# Patient Record
Sex: Male | Born: 1960 | Race: White | Hispanic: No | Smoking: Current every day smoker
Health system: Southern US, Community
[De-identification: ages and names within clinical notes are randomized; demographics above are authoritative.]

## PROBLEM LIST (undated history)

## (undated) ENCOUNTER — Emergency Department: Payer: MEDICAID | Source: Home / Self Care

## (undated) DIAGNOSIS — M1990 Unspecified osteoarthritis, unspecified site: Secondary | ICD-10-CM

## (undated) DIAGNOSIS — F1010 Alcohol abuse, uncomplicated: Secondary | ICD-10-CM

## (undated) DIAGNOSIS — E119 Type 2 diabetes mellitus without complications: Secondary | ICD-10-CM

## (undated) DIAGNOSIS — I10 Essential (primary) hypertension: Secondary | ICD-10-CM

## (undated) DIAGNOSIS — G7000 Myasthenia gravis without (acute) exacerbation: Secondary | ICD-10-CM

## (undated) DIAGNOSIS — I4891 Unspecified atrial fibrillation: Secondary | ICD-10-CM

## (undated) DIAGNOSIS — N289 Disorder of kidney and ureter, unspecified: Secondary | ICD-10-CM

## (undated) DIAGNOSIS — F10239 Alcohol dependence with withdrawal, unspecified: Secondary | ICD-10-CM

## (undated) HISTORY — PX: OTHER SURGICAL HISTORY: SHX169

---

## 1999-03-24 ENCOUNTER — Other Ambulatory Visit: Admission: RE | Admit: 1999-03-24 | Discharge: 1999-03-24 | Payer: Self-pay | Admitting: Urology

## 2001-06-07 ENCOUNTER — Inpatient Hospital Stay (HOSPITAL_COMMUNITY): Admission: EM | Admit: 2001-06-07 | Discharge: 2001-06-09 | Payer: Self-pay

## 2004-04-22 ENCOUNTER — Ambulatory Visit: Payer: Self-pay

## 2006-01-01 ENCOUNTER — Ambulatory Visit: Payer: Self-pay

## 2015-12-26 ENCOUNTER — Inpatient Hospital Stay
Admission: EM | Admit: 2015-12-26 | Discharge: 2015-12-30 | DRG: 617 | Disposition: A | Discharge status: Home / Self Care | Payer: Self-pay | Attending: Internal Medicine | Admitting: Internal Medicine

## 2015-12-26 ENCOUNTER — Encounter: Payer: Self-pay | Admitting: Emergency Medicine

## 2015-12-26 ENCOUNTER — Emergency Department: Payer: Self-pay

## 2015-12-26 DIAGNOSIS — I739 Peripheral vascular disease, unspecified: Secondary | ICD-10-CM

## 2015-12-26 DIAGNOSIS — M869 Osteomyelitis, unspecified: Secondary | ICD-10-CM

## 2015-12-26 DIAGNOSIS — Z79899 Other long term (current) drug therapy: Secondary | ICD-10-CM

## 2015-12-26 DIAGNOSIS — G7000 Myasthenia gravis without (acute) exacerbation: Secondary | ICD-10-CM

## 2015-12-26 DIAGNOSIS — Z79891 Long term (current) use of opiate analgesic: Secondary | ICD-10-CM

## 2015-12-26 DIAGNOSIS — E1169 Type 2 diabetes mellitus with other specified complication: Principal | ICD-10-CM | POA: Diagnosis present

## 2015-12-26 DIAGNOSIS — E11621 Type 2 diabetes mellitus with foot ulcer: Secondary | ICD-10-CM | POA: Diagnosis present

## 2015-12-26 DIAGNOSIS — L03032 Cellulitis of left toe: Secondary | ICD-10-CM | POA: Diagnosis present

## 2015-12-26 DIAGNOSIS — L8990 Pressure ulcer of unspecified site, unspecified stage: Secondary | ICD-10-CM | POA: Insufficient documentation

## 2015-12-26 DIAGNOSIS — Z96641 Presence of right artificial hip joint: Secondary | ICD-10-CM | POA: Diagnosis present

## 2015-12-26 DIAGNOSIS — M86172 Other acute osteomyelitis, left ankle and foot: Secondary | ICD-10-CM | POA: Diagnosis present

## 2015-12-26 DIAGNOSIS — E876 Hypokalemia: Secondary | ICD-10-CM | POA: Diagnosis present

## 2015-12-26 DIAGNOSIS — E1142 Type 2 diabetes mellitus with diabetic polyneuropathy: Secondary | ICD-10-CM | POA: Diagnosis present

## 2015-12-26 DIAGNOSIS — E1151 Type 2 diabetes mellitus with diabetic peripheral angiopathy without gangrene: Secondary | ICD-10-CM | POA: Diagnosis present

## 2015-12-26 DIAGNOSIS — E118 Type 2 diabetes mellitus with unspecified complications: Secondary | ICD-10-CM

## 2015-12-26 DIAGNOSIS — G8929 Other chronic pain: Secondary | ICD-10-CM | POA: Diagnosis present

## 2015-12-26 DIAGNOSIS — Z7984 Long term (current) use of oral hypoglycemic drugs: Secondary | ICD-10-CM

## 2015-12-26 DIAGNOSIS — I10 Essential (primary) hypertension: Secondary | ICD-10-CM | POA: Diagnosis present

## 2015-12-26 DIAGNOSIS — L97524 Non-pressure chronic ulcer of other part of left foot with necrosis of bone: Secondary | ICD-10-CM | POA: Diagnosis present

## 2015-12-26 HISTORY — DX: Myasthenia gravis without (acute) exacerbation: G70.00

## 2015-12-26 HISTORY — DX: Essential (primary) hypertension: I10

## 2015-12-26 HISTORY — DX: Disorder of kidney and ureter, unspecified: N28.9

## 2015-12-26 HISTORY — DX: Type 2 diabetes mellitus without complications: E11.9

## 2015-12-26 HISTORY — DX: Unspecified osteoarthritis, unspecified site: M19.90

## 2015-12-26 LAB — CBC WITH DIFFERENTIAL/PLATELET
BASOS ABS: 0.1 10*3/uL (ref 0–0.1)
BASOS PCT: 1 %
EOS PCT: 1 %
Eosinophils Absolute: 0.1 10*3/uL (ref 0–0.7)
HCT: 41.4 % (ref 40.0–52.0)
Hemoglobin: 14.4 g/dL (ref 13.0–18.0)
Lymphocytes Relative: 17 %
Lymphs Abs: 1.9 10*3/uL (ref 1.0–3.6)
MCH: 33.3 pg (ref 26.0–34.0)
MCHC: 34.7 g/dL (ref 32.0–36.0)
MCV: 96.1 fL (ref 80.0–100.0)
MONO ABS: 1.3 10*3/uL — AB (ref 0.2–1.0)
MONOS PCT: 12 %
Neutro Abs: 7.6 10*3/uL — ABNORMAL HIGH (ref 1.4–6.5)
Neutrophils Relative %: 69 %
PLATELETS: 250 10*3/uL (ref 150–440)
RBC: 4.31 MIL/uL — ABNORMAL LOW (ref 4.40–5.90)
RDW: 14.7 % — AB (ref 11.5–14.5)
WBC: 11 10*3/uL — ABNORMAL HIGH (ref 3.8–10.6)

## 2015-12-26 LAB — COMPREHENSIVE METABOLIC PANEL
ALBUMIN: 4 g/dL (ref 3.5–5.0)
ALT: 17 U/L (ref 17–63)
ANION GAP: 12 (ref 5–15)
AST: 29 U/L (ref 15–41)
Alkaline Phosphatase: 79 U/L (ref 38–126)
BUN: 10 mg/dL (ref 6–20)
CALCIUM: 8.9 mg/dL (ref 8.9–10.3)
CO2: 27 mmol/L (ref 22–32)
Chloride: 95 mmol/L — ABNORMAL LOW (ref 101–111)
Creatinine, Ser: 0.92 mg/dL (ref 0.61–1.24)
GFR calc Af Amer: >60 mL/min (ref >60)
GLUCOSE: 167 mg/dL — AB (ref 65–99)
POTASSIUM: 3.3 mmol/L — AB (ref 3.5–5.1)
Sodium: 134 mmol/L — ABNORMAL LOW (ref 135–145)
TOTAL PROTEIN: 7.7 g/dL (ref 6.5–8.1)
Total Bilirubin: 0.7 mg/dL (ref 0.3–1.2)

## 2015-12-26 LAB — GLUCOSE, CAPILLARY
Glucose-Capillary: 148 mg/dL — ABNORMAL HIGH (ref 65–99)
Glucose-Capillary: 150 mg/dL — ABNORMAL HIGH (ref 65–99)

## 2015-12-26 MED ORDER — INSULIN ASPART 100 UNIT/ML ~~LOC~~ SOLN
0.0000 [IU] | Freq: Three times a day (TID) | SUBCUTANEOUS | Status: DC
  Administered 2015-12-27: 1 [IU] via SUBCUTANEOUS
  Filled 2015-12-26: qty 1

## 2015-12-26 MED ORDER — PIPERACILLIN-TAZOBACTAM 3.375 G IVPB
3.3750 g | Freq: Three times a day (TID) | INTRAVENOUS | Status: DC
Start: 2015-12-26 — End: 2015-12-27
  Administered 2015-12-26 – 2015-12-27 (×2): 3.375 g via INTRAVENOUS
  Filled 2015-12-26 (×7): qty 50

## 2015-12-26 MED ORDER — VANCOMYCIN HCL IN DEXTROSE 1-5 GM/200ML-% IV SOLN
1000.0000 mg | Freq: Once | INTRAVENOUS | Status: AC
  Administered 2015-12-26: 1000 mg via INTRAVENOUS
  Filled 2015-12-26: qty 200

## 2015-12-26 MED ORDER — ASPIRIN EC 81 MG PO TBEC
81.0000 mg | DELAYED_RELEASE_TABLET | Freq: Every day | ORAL | Status: DC
  Administered 2015-12-27 – 2015-12-30 (×3): 81 mg via ORAL
  Filled 2015-12-26 (×3): qty 1

## 2015-12-26 MED ORDER — BISACODYL 10 MG RE SUPP: 10.0000 mg | Freq: Every day | RECTAL | Status: DC | PRN

## 2015-12-26 MED ORDER — METOPROLOL TARTRATE 25 MG PO TABS
25.0000 mg | ORAL_TABLET | Freq: Two times a day (BID) | ORAL | Status: DC
  Administered 2015-12-26 – 2015-12-29 (×6): 25 mg via ORAL
  Filled 2015-12-26 (×8): qty 1

## 2015-12-26 MED ORDER — LOSARTAN POTASSIUM 50 MG PO TABS
50.0000 mg | ORAL_TABLET | Freq: Every day | ORAL | Status: DC
  Administered 2015-12-27 – 2015-12-28 (×2): 50 mg via ORAL
  Filled 2015-12-26 (×2): qty 1

## 2015-12-26 MED ORDER — FENTANYL 50 MCG/HR TD PT72
50.0000 ug | MEDICATED_PATCH | TRANSDERMAL | Status: DC
  Administered 2015-12-27: 50 ug via TRANSDERMAL
  Filled 2015-12-26: qty 1

## 2015-12-26 MED ORDER — HYDROCODONE-ACETAMINOPHEN 5-325 MG PO TABS
1.0000 | ORAL_TABLET | ORAL | Status: DC | PRN
  Administered 2015-12-26 – 2015-12-27 (×2): 2 via ORAL
  Administered 2015-12-27: 1 via ORAL
  Administered 2015-12-27 – 2015-12-30 (×8): 2 via ORAL
  Filled 2015-12-26 (×3): qty 2
  Filled 2015-12-26: qty 1
  Filled 2015-12-26 (×8): qty 2

## 2015-12-26 MED ORDER — DOCUSATE SODIUM 100 MG PO CAPS
100.0000 mg | ORAL_CAPSULE | Freq: Two times a day (BID) | ORAL | Status: DC
Start: 2015-12-26 — End: 2015-12-30
  Administered 2015-12-26 – 2015-12-30 (×7): 100 mg via ORAL
  Filled 2015-12-26 (×7): qty 1

## 2015-12-26 MED ORDER — ACETAMINOPHEN 650 MG RE SUPP: 650.0000 mg | Freq: Four times a day (QID) | RECTAL | Status: DC | PRN

## 2015-12-26 MED ORDER — GABAPENTIN 600 MG PO TABS
600.0000 mg | ORAL_TABLET | Freq: Three times a day (TID) | ORAL | Status: DC
  Administered 2015-12-26 – 2015-12-30 (×10): 600 mg via ORAL
  Filled 2015-12-26 (×11): qty 1

## 2015-12-26 MED ORDER — MORPHINE SULFATE (PF) 2 MG/ML IV SOLN
INTRAVENOUS | Status: AC
  Administered 2015-12-26: 2 mg via INTRAVENOUS
  Filled 2015-12-26: qty 1

## 2015-12-26 MED ORDER — PIPERACILLIN-TAZOBACTAM 3.375 G IVPB 30 MIN
3.3750 g | Freq: Once | INTRAVENOUS | Status: AC
  Administered 2015-12-26: 3.375 g via INTRAVENOUS
  Filled 2015-12-26: qty 50

## 2015-12-26 MED ORDER — HEPARIN SODIUM (PORCINE) 5000 UNIT/ML IJ SOLN
5000.0000 [IU] | Freq: Three times a day (TID) | INTRAMUSCULAR | Status: DC
Start: 2015-12-26 — End: 2015-12-30
  Administered 2015-12-26 – 2015-12-30 (×9): 5000 [IU] via SUBCUTANEOUS
  Filled 2015-12-26 (×9): qty 1

## 2015-12-26 MED ORDER — ACETAMINOPHEN 325 MG PO TABS: 650.0000 mg | ORAL_TABLET | Freq: Four times a day (QID) | ORAL | Status: DC | PRN

## 2015-12-26 MED ORDER — SODIUM CHLORIDE 0.9 % IV SOLN
1500.0000 mg | Freq: Two times a day (BID) | INTRAVENOUS | Status: DC
  Administered 2015-12-27: 1500 mg via INTRAVENOUS
  Filled 2015-12-26 (×4): qty 1500

## 2015-12-26 MED ORDER — ONDANSETRON HCL 4 MG PO TABS: 4.0000 mg | ORAL_TABLET | Freq: Four times a day (QID) | ORAL | Status: DC | PRN

## 2015-12-26 MED ORDER — CLONAZEPAM 0.5 MG PO TABS: 0.5000 mg | ORAL_TABLET | Freq: Three times a day (TID) | ORAL | Status: DC | PRN

## 2015-12-26 MED ORDER — ONDANSETRON HCL 4 MG/2ML IJ SOLN: 4.0000 mg | Freq: Four times a day (QID) | INTRAMUSCULAR | Status: DC | PRN

## 2015-12-26 MED ORDER — CITALOPRAM HYDROBROMIDE 20 MG PO TABS
20.0000 mg | ORAL_TABLET | Freq: Every day | ORAL | Status: DC
  Administered 2015-12-27 – 2015-12-30 (×4): 20 mg via ORAL
  Filled 2015-12-26 (×4): qty 1

## 2015-12-26 MED ORDER — MYCOPHENOLATE MOFETIL 250 MG PO CAPS
250.0000 mg | ORAL_CAPSULE | Freq: Two times a day (BID) | ORAL | Status: DC
  Administered 2015-12-26 – 2015-12-28 (×4): 250 mg via ORAL
  Filled 2015-12-26 (×5): qty 1

## 2015-12-26 MED ORDER — POTASSIUM CHLORIDE IN NACL 20-0.9 MEQ/L-% IV SOLN
INTRAVENOUS | Status: AC
  Filled 2015-12-26: qty 1000

## 2015-12-26 MED ORDER — POTASSIUM CHLORIDE IN NACL 20-0.9 MEQ/L-% IV SOLN
INTRAVENOUS | Status: DC
  Administered 2015-12-26 – 2015-12-28 (×3): via INTRAVENOUS
  Filled 2015-12-26 (×7): qty 1000

## 2015-12-26 MED ORDER — MORPHINE SULFATE (PF) 2 MG/ML IV SOLN
2.0000 mg | INTRAVENOUS | Status: DC | PRN
  Administered 2015-12-26 – 2015-12-28 (×11): 2 mg via INTRAVENOUS
  Filled 2015-12-26 (×10): qty 1

## 2015-12-26 MED ORDER — PANTOPRAZOLE SODIUM 40 MG PO TBEC
40.0000 mg | DELAYED_RELEASE_TABLET | Freq: Every day | ORAL | Status: DC
  Administered 2015-12-27 – 2015-12-30 (×3): 40 mg via ORAL
  Filled 2015-12-26 (×3): qty 1

## 2015-12-26 NOTE — Progress Notes (Signed)
Pharmacy Antibiotic Note  Kurt Horton is a 55 y.o. male admitted on 12/26/2015 with osteomyelitis.  Pharmacy has been consulted for Vancomycin and Zosyn dosing.  Plan: Vancomycin 1500 IV every 12 hours.  Goal trough 15-20 mcg/mL. Zosyn 3.375g IV q8h (4 hour infusion).  Height: 5\' 10"  (177.8 cm) Weight: 198 lb (89.8 kg) IBW/kg (Calculated) : 73  Temp (24hrs), Avg:98 F (36.7 C), Min:98 F (36.7 C), Max:98 F (36.7 C)   Recent Labs Lab 12/26/15 1641  WBC 11.0*  CREATININE 0.92    Estimated Creatinine Clearance: 103.5 mL/min (by C-G formula based on SCr of 0.92 mg/dL).    Allergies  Allergen Reactions  . Other Other (See Comments)    Patient states he can't take any "mycin" medications because myasthenia gravis.    Antimicrobials this admission: Vancomycin 8/20 >>  Zosyn 8/20 >>   Dose adjustments this admission:  Microbiology results:  Thank you for allowing pharmacy to be a part of this patient's care.  Clovia CuffLisa Shalece Staffa, PharmD, BCPS 12/26/2015 7:21 PM

## 2015-12-26 NOTE — H&P (Signed)
History and Physical    Kurt Horton WUJ:[id number] DOB: 13-Mar-1961 DOA: 12/26/2015  Referring physician: Dr. Cyril LoosenKinner PCP: No primary care provider on file.  Specialists: none  Chief Complaint: foot pain  HPI: Kurt Horton is a 55 y.o. male has a past medical history significant for DM, PVD, and Myasthenia Gravis now with 2 week hx of progressive left foot pain and swelling with ulceration and redness. No fever. No N/V/D. Denies Cp or SOB. Plain films in ER suggest osteomyelitis. He is now admitted.  Review of Systems: The patient denies anorexia, fever, weight loss,, vision loss, decreased hearing, hoarseness, chest pain, syncope, dyspnea on exertion, peripheral edema, balance deficits, hemoptysis, abdominal pain, melena, hematochezia, severe indigestion/heartburn, hematuria, incontinence, genital sores, muscle weakness, suspicious skin lesions, transient blindness, difficulty walking, depression, unusual weight change, abnormal bleeding, enlarged lymph nodes, angioedema, and breast masses.   Past Medical History:  Diagnosis Date  . Diabetes mellitus without complication (HCC)   . DJD (degenerative joint disease)   . Hypertension   . Myasthenia gravis (HCC)   . Renal disorder    History reviewed. No pertinent surgical history. Social History:  reports that he has never smoked. He has never used smokeless tobacco. His alcohol and drug histories are not on file.  No Known Allergies  History reviewed. No pertinent family history.  Prior to Admission medications   Not on File   Physical Exam: Vitals:   12/26/15 1542 12/26/15 1601  BP:  133/89  Pulse:  94  Resp:  16  Temp:  98 F (36.7 C)  TempSrc:  Oral  SpO2:  96%  Weight: 89.8 kg (198 lb)   Height: 5\' 10"  (1.778 m)      General:  No apparent distress, WDWN, [place]/AT  Eyes: PERRL, EOMI, no scleral icterus, conjunctiva clear  ENT: moist oropharynx without exudate, TM's benign, dentition fair  Neck: supple, no  lymphadenopathy. No bruits or thyromegaly  Cardiovascular: regular rate without MRG; 2+ peripheral pulses, no JVD, 1+ peripheral edema of left foot  Respiratory: CTA biL, good air movement without wheezing, rhonchi or crackled. Respiratory effort normal  Abdomen: soft, non tender to palpation, positive bowel sounds, no guarding, no rebound  Skin: small ulceration to left foot/2nd toe with surrounding warmth, erythema, and tenderness. No purulence  Musculoskeletal: normal bulk and tone, no joint swelling  Psychiatric: normal mood and affect, A&OX3  Neurologic: CN 2-12 grossly intact, Motor strength 5/5 in all 4 groups with symmetric DTR's and stocking-glove neuropathy noted  Labs on Admission:  Basic Metabolic Panel:  Recent Labs Lab 12/26/15 1641  NA 134*  K 3.3*  CL 95*  CO2 27  GLUCOSE 167*  BUN 10  CREATININE 0.92  CALCIUM 8.9   Liver Function Tests:  Recent Labs Lab 12/26/15 1641  AST 29  ALT 17  ALKPHOS 79  BILITOT 0.7  PROT 7.7  ALBUMIN 4.0   No results for input(s): LIPASE, AMYLASE in the last 168 hours. No results for input(s): AMMONIA in the last 168 hours. CBC:  Recent Labs Lab 12/26/15 1641  WBC 11.0*  NEUTROABS 7.6*  HGB 14.4  HCT 41.4  MCV 96.1  PLT 250   Cardiac Enzymes: No results for input(s): CKTOTAL, CKMB, CKMBINDEX, TROPONINI in the last 168 hours.  BNP (last 3 results) No results for input(s): BNP in the last 8760 hours.  ProBNP (last 3 results) No results for input(s): PROBNP in the last 8760 hours.  CBG:  Recent Labs Lab 12/26/15  1621  GLUCAP 148*    Radiological Exams on Admission: Dg Foot Complete Left  Result Date: 12/26/2015 CLINICAL DATA:  Left foot swelling, second toe ulcer EXAM: LEFT FOOT - COMPLETE 3+ VIEW COMPARISON:  None. FINDINGS: Three views of the left foot submitted. No acute fracture or subluxation. There is soft tissue swelling dorsal tarsal and metatarsal region. Degenerative changes are noted first  metatarsal phalangeal joint. Diffuse soft tissue swelling noted second toe. Subtle erosive changes are noted at the tip of distal phalanx second toe. Findings are highly suspicious for osteomyelitis. Clinical correlation is necessary. Further correlation with MRI could be performed as clinically warranted. IMPRESSION: No acute fracture or subluxation. There is soft tissue swelling dorsal tarsal and metatarsal region. Degenerative changes are noted first metatarsal phalangeal joint. Diffuse soft tissue swelling noted second toe. Subtle erosive changes are noted at the tip of distal phalanx second toe. Findings are highly suspicious for osteomyelitis. Clinical correlation is necessary. Further correlation with MRI could be performed as clinically warranted. Electronically Signed   By: Natasha MeadLiviu  Pop M.D.   On: 12/26/2015 17:23    EKG: Independently reviewed.  Assessment/Plan Principal Problem:   Osteomyelitis (HCC) Active Problems:   PVD (peripheral vascular disease) (HCC)   Type II diabetes mellitus with manifestations (HCC)   Myasthenia gravis (HCC)   Will admit to floor with IV fluids and IV ABX. Cultures sent. Consult Podiatry and Vascular. Follow sugars. Repeat labs in AM.  Diet: carb modified Fluids: NS with K+@100  DVT Prophylaxis: SQ Heparin  Code Status: FULL  Family Communication: none  Disposition Plan: home  Time spent: 50 min

## 2015-12-26 NOTE — ED Triage Notes (Signed)
C/O left foot swelling and left second toe ulcer x 2-3 weeks.  Patient states sugars have been normal.

## 2015-12-26 NOTE — ED Provider Notes (Signed)
[facility] Emergency Department Provider Note   ____________________________________________    I have reviewed the triage vital signs and the nursing notes.   HISTORY  Chief Complaint Foot Swelling     HPI Kurt PhenesJohn D Tirpak is a 55 y.o. male who presents with complaints of foot swelling and pain which has occurred over the last 2 weeks. 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 He also has a history of myasthenia gravis. He reports small ulceration in his left second toe with swelling of his toe and now redness pain and swelling of his foot. He denies fevers or chills. He has recently moved back to the area does not have a podiatrist here. He has not taken anything for this   Past Medical History:  Diagnosis Date  . Diabetes mellitus without complication (HCC)   . DJD (degenerative joint disease)   . Hypertension   . Myasthenia gravis (HCC)   . Renal disorder     There are no active problems to display for this patient.   No past surgical history on file.  Prior to Admission medications   Not on File     Allergies Review of patient's allergies indicates no known allergies.  No family history on file.  Social History Social History  Substance Use Topics  . Smoking status: Never Smoker  . Smokeless tobacco: Never Used  . Alcohol use Not on file    Review of Systems  Constitutional: No fever/chills   Respiratory: Denies shortness of breath. Gastrointestinal: No abdominal pain.    Musculoskeletal: As above Skin: As above Neurological: Negative for headaches   10-point ROS otherwise negative.  ____________________________________________   PHYSICAL EXAM:  VITAL SIGNS: ED Triage Vitals  Enc Vitals Group     BP 12/26/15 1601 133/89     Pulse Rate 12/26/15 1601 94     Resp 12/26/15 1601 16     Temp 12/26/15 1601 98 F (36.7 C)     Temp Source 12/26/15 1601 Oral     SpO2 12/26/15  1601 96 %     Weight 12/26/15 1542 198 lb (89.8 kg)     Height 12/26/15 1542 5\' 10"  (1.778 m)     Head Circumference --      Peak Flow --      Pain Score 12/26/15 1544 8     Pain Loc --      Pain Edu? --      Excl. in GC? --     Constitutional: Alert and oriented. No acute distress.  Eyes: Conjunctivae are normal.  Head: Atraumatic. Nose: No congestion/rhinnorhea. Mouth/Throat: Mucous membranes are moist.   Neck:  Painless ROM Cardiovascular: Normal rate, regular rhythm. Grossly normal heart sounds.  Good peripheral circulation. Respiratory: Normal respiratory effort.  No retractions. Lungs CTAB. Gastrointestinal: Soft and nontender. No distention.  No CVA tenderness. Genitourinary: deferred Musculoskeletal: Patient with 1 x 1 cm ulceration in the left second distal toe, uniform erythema and swelling of the toe. Mild erythema extending onto the foot as well. 2+ distal pulses. Warm and well perfused Neurologic:  Normal speech and language. No gross focal neurologic deficits are appreciated.  Skin:  Skin is warm, dry. As above Psychiatric: Mood and affect are normal. Speech and behavior are normal.  ____________________________________________   LABS (all labs ordered are listed, but only abnormal results are displayed)  Labs Reviewed  GLUCOSE, CAPILLARY - Abnormal; Notable for the following:  Result Value   Glucose-Capillary 148 (*)    All other components within normal limits  CBC WITH DIFFERENTIAL/PLATELET - Abnormal; Notable for the following:    WBC 11.0 (*)    RBC 4.31 (*)    RDW 14.7 (*)    Neutro Abs 7.6 (*)    Monocytes Absolute 1.3 (*)    All other components within normal limits  COMPREHENSIVE METABOLIC PANEL - Abnormal; Notable for the following:    Sodium 134 (*)    Potassium 3.3 (*)    Chloride 95 (*)    Glucose, Bld 167 (*)    All other components within normal limits  CULTURE, BLOOD (ROUTINE X 2)  CULTURE, BLOOD (ROUTINE X 2)    ____________________________________________  EKG  None ____________________________________________  RADIOLOGY  Concerning for osteomyelitis ____________________________________________   PROCEDURES  Procedure(s) performed: No    Critical Care performed: No ____________________________________________   INITIAL IMPRESSION / ASSESSMENT AND PLAN / ED COURSE  Pertinent labs & imaging results that were available during my care of the patient were reviewed by me and considered in my medical decision making (see chart for details).  Patient's clinical exam is concerning for osteomyelitis, x-ray pending, labs pending.  Clinical Course  Labs are reassuring however x-ray is consistent with osteomyelitis. I ordered Vanco and Zosyn and admit the patient to hospital for further management ____________________________________________   FINAL CLINICAL IMPRESSION(S) / ED DIAGNOSES  Final diagnoses:  Acute osteomyelitis of left foot (HCC)      NEW MEDICATIONS STARTED DURING THIS VISIT:  New Prescriptions   No medications on file     Note:  This document was prepared using Dragon voice recognition software and may include unintentional dictation errors.    Jene Everyobert Concha Sudol, MD 12/26/15 [phone]

## 2015-12-27 ENCOUNTER — Encounter
Admission: EM | Disposition: A | Discharge status: Home / Self Care | Payer: Self-pay | Source: Home / Self Care | Attending: Internal Medicine

## 2015-12-27 HISTORY — PX: PERIPHERAL VASCULAR CATHETERIZATION: SHX172C

## 2015-12-27 LAB — COMPREHENSIVE METABOLIC PANEL
ALBUMIN: 3.1 g/dL — AB (ref 3.5–5.0)
ALT: 13 U/L — ABNORMAL LOW (ref 17–63)
AST: 18 U/L (ref 15–41)
Alkaline Phosphatase: 64 U/L (ref 38–126)
Anion gap: 7 (ref 5–15)
BUN: 13 mg/dL (ref 6–20)
CHLORIDE: 99 mmol/L — AB (ref 101–111)
CO2: 29 mmol/L (ref 22–32)
Calcium: 8.6 mg/dL — ABNORMAL LOW (ref 8.9–10.3)
Creatinine, Ser: 0.83 mg/dL (ref 0.61–1.24)
GFR calc Af Amer: >60 mL/min (ref >60)
GFR calc non Af Amer: >60 mL/min (ref >60)
GLUCOSE: 119 mg/dL — AB (ref 65–99)
POTASSIUM: 3.3 mmol/L — AB (ref 3.5–5.1)
SODIUM: 135 mmol/L (ref 135–145)
Total Bilirubin: 0.3 mg/dL (ref 0.3–1.2)
Total Protein: 6.3 g/dL — ABNORMAL LOW (ref 6.5–8.1)

## 2015-12-27 LAB — CBC
HEMATOCRIT: 37.5 % — AB (ref 40.0–52.0)
Hemoglobin: 12.6 g/dL — ABNORMAL LOW (ref 13.0–18.0)
MCH: 32.2 pg (ref 26.0–34.0)
MCHC: 33.8 g/dL (ref 32.0–36.0)
MCV: 95.4 fL (ref 80.0–100.0)
Platelets: 232 10*3/uL (ref 150–440)
RBC: 3.93 MIL/uL — ABNORMAL LOW (ref 4.40–5.90)
RDW: 14.5 % (ref 11.5–14.5)
WBC: 6.3 10*3/uL (ref 3.8–10.6)

## 2015-12-27 LAB — GLUCOSE, CAPILLARY
GLUCOSE-CAPILLARY: 122 mg/dL — AB (ref 65–99)
GLUCOSE-CAPILLARY: 113 mg/dL — AB (ref 65–99)
Glucose-Capillary: 97 mg/dL (ref 65–99)
Glucose-Capillary: 131 mg/dL — ABNORMAL HIGH (ref 65–99)

## 2015-12-27 LAB — HEMOGLOBIN A1C: HEMOGLOBIN A1C: 6.1 % — AB (ref 4.0–6.0)

## 2015-12-27 LAB — SEDIMENTATION RATE: Sed Rate: 44 mm/hr — ABNORMAL HIGH (ref 0–20)

## 2015-12-27 SURGERY — LOWER EXTREMITY ANGIOGRAPHY
Anesthesia: Moderate Sedation

## 2015-12-27 MED ORDER — MIDAZOLAM HCL 2 MG/2ML IJ SOLN
INTRAMUSCULAR | Status: DC | PRN
  Administered 2015-12-27 (×2): 1 mg via INTRAVENOUS
  Administered 2015-12-27: 2 mg via INTRAVENOUS

## 2015-12-27 MED ORDER — DEXTROSE 5 % IV SOLN
1.5000 g | Freq: Once | INTRAVENOUS | Status: DC
  Administered 2015-12-27: 1.5 g via INTRAVENOUS

## 2015-12-27 MED ORDER — FENTANYL CITRATE (PF) 100 MCG/2ML IJ SOLN
INTRAMUSCULAR | Status: AC
  Filled 2015-12-27: qty 2

## 2015-12-27 MED ORDER — HEPARIN (PORCINE) IN NACL 2-0.9 UNIT/ML-% IJ SOLN
INTRAMUSCULAR | Status: AC
  Filled 2015-12-27: qty 1000

## 2015-12-27 MED ORDER — CEFAZOLIN IN D5W 1 GM/50ML IV SOLN
1.0000 g | INTRAVENOUS | Status: AC
  Filled 2015-12-27: qty 50

## 2015-12-27 MED ORDER — TOLNAFTATE 1 % EX CREA
TOPICAL_CREAM | Freq: Two times a day (BID) | CUTANEOUS | Status: DC
  Administered 2015-12-27 – 2015-12-28 (×3): via TOPICAL
  Administered 2015-12-29: 1 via TOPICAL
  Administered 2015-12-29: 09:00:00 via TOPICAL
  Filled 2015-12-27: qty 30

## 2015-12-27 MED ORDER — AMMONIUM LACTATE 12 % EX LOTN
TOPICAL_LOTION | CUTANEOUS | Status: DC | PRN
  Filled 2015-12-27: qty 400

## 2015-12-27 MED ORDER — FENTANYL CITRATE (PF) 100 MCG/2ML IJ SOLN
INTRAMUSCULAR | Status: DC | PRN
  Administered 2015-12-27: 50 ug via INTRAVENOUS
  Administered 2015-12-27 (×2): 25 ug via INTRAVENOUS

## 2015-12-27 MED ORDER — HEPARIN SODIUM (PORCINE) 1000 UNIT/ML IJ SOLN
INTRAMUSCULAR | Status: AC
  Filled 2015-12-27: qty 1

## 2015-12-27 MED ORDER — MIDAZOLAM HCL 5 MG/5ML IJ SOLN
INTRAMUSCULAR | Status: AC
  Filled 2015-12-27: qty 5

## 2015-12-27 MED ORDER — LEVOFLOXACIN 500 MG PO TABS
500.0000 mg | ORAL_TABLET | Freq: Every day | ORAL | Status: DC
  Administered 2015-12-27 – 2015-12-30 (×4): 500 mg via ORAL
  Filled 2015-12-27 (×5): qty 1

## 2015-12-27 MED ORDER — LIDOCAINE-EPINEPHRINE (PF) 1 %-1:200000 IJ SOLN
INTRAMUSCULAR | Status: AC
  Filled 2015-12-27: qty 30

## 2015-12-27 SURGICAL SUPPLY — 7 items
CATH PIG 70CM (CATHETERS) ×2 IMPLANT
DEVICE STARCLOSE SE CLOSURE (Vascular Products) ×2 IMPLANT
PACK ANGIOGRAPHY (CUSTOM PROCEDURE TRAY) ×2 IMPLANT
SHEATH BRITE TIP 5FRX11 (SHEATH) ×2 IMPLANT
SYR MEDRAD MARK V 150ML (SYRINGE) ×2 IMPLANT
TUBING CONTRAST HIGH PRESS 72 (TUBING) ×2 IMPLANT
WIRE J 3MM .035X145CM (WIRE) ×2 IMPLANT

## 2015-12-27 NOTE — Progress Notes (Signed)
Pt on bipap at night at home, thus will use for procedure,thus etco2 at baseline only, vss,

## 2015-12-27 NOTE — Progress Notes (Signed)
[employer] [facility] PROGRESS NOTE  Kurt PhenesJohn D Horton UJW:[id number] DOB: [birth year] DOA: 12/26/2015 PCP: No primary care provider on file.  HPI/Subjective: Patient with feet swelling. Also some pain in the tip of his toe and ulceration. Yesterday he stated the area was red surrounding.  Objective: Vitals:   12/27/15 0843 12/27/15 1432  BP: 122/71 105/86  Pulse: 84 65  Resp:  20  Temp:  97.5 F (36.4 C)    Filed Weights   12/26/15 1542 12/26/15 2100 12/27/15 0443  Weight: 89.8 kg (198 lb) 93.5 kg (206 lb 1.6 oz) 93.4 kg (206 lb)    ROS: Review of Systems  Constitutional: Negative for chills and fever.  Eyes: Negative for blurred vision.  Respiratory: Negative for cough and shortness of breath.   Cardiovascular: Negative for chest pain.  Gastrointestinal: Negative for abdominal pain, constipation, diarrhea, nausea and vomiting.  Genitourinary: Negative for dysuria.  Musculoskeletal: Positive for joint pain.  Neurological: Negative for dizziness and headaches.   Exam: Physical Exam  Constitutional: He is oriented to person, place, and time.  HENT:  Nose: No mucosal edema.  Mouth/Throat: No oropharyngeal exudate or posterior oropharyngeal edema.  Eyes: Conjunctivae, EOM and lids are normal. Pupils are equal, round, and reactive to light.  Neck: No JVD present. Carotid bruit is not present. No edema present. No thyroid mass and no thyromegaly present.  Cardiovascular: S1 normal and S2 normal.  Exam reveals no gallop.   No murmur heard. Pulses:      Dorsalis pedis pulses are 2+ on the right side, and 2+ on the left side.  Respiratory: No respiratory distress. He has no wheezes. He has no rhonchi. He has no rales.  GI: Soft. Bowel sounds are normal. There is no tenderness.  Musculoskeletal:       Right ankle: He exhibits no swelling.       Left ankle: He exhibits no swelling.  Lymphadenopathy:    He has no cervical adenopathy.  Neurological: He is alert and oriented to person,  place, and time. No cranial nerve deficit.  Skin: Skin is warm. Nails show no clubbing.  Dry skin bilateral feet. Left second toe small little ulcer. Whenever erythema on the toe seems to have settled down. Right foot fifth MT joint some swelling there.  Psychiatric: He has a normal mood and affect.      Data Reviewed: Basic Metabolic Panel:  Recent Labs Lab 12/26/15 1641 12/27/15 0412  NA 134* 135  K 3.3* 3.3*  CL 95* 99*  CO2 27 29  GLUCOSE 167* 119*  BUN 10 13  CREATININE 0.92 0.83  CALCIUM 8.9 8.6*   Liver Function Tests:  Recent Labs Lab 12/26/15 1641 12/27/15 0412  AST 29 18  ALT 17 13*  ALKPHOS 79 64  BILITOT 0.7 0.3  PROT 7.7 6.3*  ALBUMIN 4.0 3.1*   CBC:  Recent Labs Lab 12/26/15 1641 12/27/15 0412  WBC 11.0* 6.3  NEUTROABS 7.6*  --   HGB 14.4 12.6*  HCT 41.4 37.5*  MCV 96.1 95.4  PLT 250 232    CBG:  Recent Labs Lab 12/26/15 1621 12/26/15 2222 12/27/15 0831 12/27/15 1210  GLUCAP 148* 150* 122* 113*    Recent Results (from the past 240 hour(s))  Blood culture (routine x 2)     Status: None (Preliminary result)   Collection Time: 12/26/15  5:44 PM  Result Value Ref Range Status   Specimen Description BLOOD LEFT FOREARM  Final   Special Requests BOTTLES DRAWN AEROBIC  AND ANAEROBIC 12CC  Final   Culture NO GROWTH < 24 HOURS  Final   Report Status PENDING  Incomplete  Blood culture (routine x 2)     Status: None (Preliminary result)   Collection Time: 12/26/15  6:00 PM  Result Value Ref Range Status   Specimen Description BLOOD RIGHT ANTECUBITAL  Final   Special Requests   Final    BOTTLES DRAWN AEROBIC AND ANAEROBIC  AER 12CC ANA 15CC   Culture NO GROWTH < 24 HOURS  Final   Report Status PENDING  Incomplete     Studies: Dg Foot Complete Left  Result Date: 12/26/2015 CLINICAL DATA:  Left foot swelling, second toe ulcer EXAM: LEFT FOOT - COMPLETE 3+ VIEW COMPARISON:  None. FINDINGS: Three views of the left foot submitted. No acute  fracture or subluxation. There is soft tissue swelling dorsal tarsal and metatarsal region. Degenerative changes are noted first metatarsal phalangeal joint. Diffuse soft tissue swelling noted second toe. Subtle erosive changes are noted at the tip of distal phalanx second toe. Findings are highly suspicious for osteomyelitis. Clinical correlation is necessary. Further correlation with MRI could be performed as clinically warranted. IMPRESSION: No acute fracture or subluxation. There is soft tissue swelling dorsal tarsal and metatarsal region. Degenerative changes are noted first metatarsal phalangeal joint. Diffuse soft tissue swelling noted second toe. Subtle erosive changes are noted at the tip of distal phalanx second toe. Findings are highly suspicious for osteomyelitis. Clinical correlation is necessary. Further correlation with MRI could be performed as clinically warranted. Electronically Signed   By: Natasha Mead M.D.   On: 12/26/2015 17:23    Scheduled Meds: . [MAR Hold] aspirin EC  81 mg Oral Daily  . [MAR Hold]  ceFAZolin (ANCEF) IV  1 g Intravenous On Call  . cefUROXime (ZINACEF)  IV  1.5 g Intravenous Once  . [MAR Hold] citalopram  20 mg Oral Daily  . [MAR Hold] docusate sodium  100 mg Oral BID  . [MAR Hold] fentaNYL  50 mcg Transdermal Q72H  . [MAR Hold] gabapentin  600 mg Oral TID  . [MAR Hold] heparin  5,000 Units Subcutaneous Q8H  . [MAR Hold] insulin aspart  0-9 Units Subcutaneous TID WC  . [MAR Hold] losartan  50 mg Oral Daily  . [MAR Hold] metoprolol tartrate  25 mg Oral BID  . [MAR Hold] mycophenolate  250 mg Oral BID  . [MAR Hold] pantoprazole  40 mg Oral Daily  . [MAR Hold] piperacillin-tazobactam (ZOSYN)  IV  3.375 g Intravenous Q8H  . [MAR Hold] vancomycin  1,500 mg Intravenous Q12H   Continuous Infusions: . 0.9 % NaCl with KCl 20 mEq / L 100 mL/hr at 12/27/15 0911    Assessment/Plan:  1. Cellulitis with Left second toe ulceration. X-ray commented on possible  osteomyelitis but sedimentation rate only 44. Patient placed on aggressive antibiotics with vancomycin and Zosyn. Await podiatry evaluation. 2. Peripheral vascular disease. Patient having angiogram today 3. Hypokalemia potassium in IV fluids 4. Myasthenia gravis 5. Essential hypertension on metoprolol and losartan 6. Chronic pain on fentanyl patch, gabapentin 7. Type 2 diabetes mellitus continue sliding scale 8. Fungal infection feet start Lac-Hydrin lotion and Tinactin.  Code Status:     Code Status Orders        Start     Ordered   12/26/15 1928  Full code  Continuous     12/26/15 1927    Code Status History    Date Active Date Inactive Code Status  Order ID Comments User Context   This patient has a current code status but no historical code status.     Disposition Plan: Home soon depending on podiatry evaluation.  Consultants:  Podiatry  Procedures:  Angiogram  Antibiotics:  Vancomycin  Zosyn  Time spent: 25 minutes  Alford HighlandWIETING, Skylinn Vialpando  [employer]

## 2015-12-27 NOTE — Consult Note (Signed)
Chart reviewed.   Pt just returning from angio. Will evaluate in AM.

## 2015-12-27 NOTE — Consult Note (Signed)
[facility] Vascular Consult Note  MRN : [id number]  Kurt Horton is a 55 y.o. (Jun 08, 1960) male who presents with chief complaint of  Chief Complaint  Patient presents with  . Foot Swelling  .  History of Present Illness:  Patient presents with a three week history of a small ulceration on his left foot (bottom of second toe). States he recently moved back from [place]. He endorses noticing the small ulceration about three weeks ago however does not know how long it has been there. States pain in his left foot both at rest and with activity. Denies any claudication or rest pain but does state he has sciatica and DJD in his back. He experiences intermittent bilateral lower extremity swelling which usually worsens throughout the day. 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 No history of ulcerations in the past. Does not wear compression or elevate his legs. Right hip replacement a few years ago otherwise no trauma or surgery to his bilateral lower extremity. No local wound care to the ulceration. No drainage from wound. Denies any fever, nausea or vomiting.   Current Facility-Administered Medications  Medication Dose Route Frequency Provider Last Rate Last Dose  . 0.9 % NaCl with KCl 20 mEq/ L  infusion   Intravenous Continuous Marguarite ArbourJeffrey D Sparks, MD 100 mL/hr at 12/26/15 1814    . acetaminophen (TYLENOL) tablet 650 mg  650 mg Oral Q6H PRN Marguarite ArbourJeffrey D Sparks, MD       Or  . acetaminophen (TYLENOL) suppository 650 mg  650 mg Rectal Q6H PRN Marguarite ArbourJeffrey D Sparks, MD      . aspirin EC tablet 81 mg  81 mg Oral Daily Marguarite ArbourJeffrey D Sparks, MD      . bisacodyl (DULCOLAX) suppository 10 mg  10 mg Rectal Daily PRN Marguarite ArbourJeffrey D Sparks, MD      . citalopram (CELEXA) tablet 20 mg  20 mg Oral Daily Marguarite ArbourJeffrey D Sparks, MD      . clonazePAM Scarlette Calico(KLONOPIN) tablet 0.5 mg  0.5 mg Oral TID PRN Marguarite ArbourJeffrey D Sparks, MD      . docusate sodium (COLACE) capsule 100 mg  100 mg Oral BID Marguarite ArbourJeffrey D Sparks, MD   100 mg at  12/26/15 2053  . fentaNYL (DURAGESIC - dosed mcg/hr) 50 mcg  50 mcg Transdermal Q72H Marguarite ArbourJeffrey D Sparks, MD      . gabapentin (NEURONTIN) tablet 600 mg  600 mg Oral TID Marguarite ArbourJeffrey D Sparks, MD   600 mg at 12/26/15 2052  . heparin injection 5,000 Units  5,000 Units Subcutaneous Q8H Marguarite ArbourJeffrey D Sparks, MD   5,000 Units at 12/27/15 [phone]  . HYDROcodone-acetaminophen (NORCO/VICODIN) 5-325 MG per tablet 1-2 tablet  1-2 tablet Oral Q4H PRN Marguarite ArbourJeffrey D Sparks, MD   2 tablet at 12/27/15 0106  . insulin aspart (novoLOG) injection 0-9 Units  0-9 Units Subcutaneous TID WC Marguarite ArbourJeffrey D Sparks, MD      . losartan (COZAAR) tablet 50 mg  50 mg Oral Daily Marguarite ArbourJeffrey D Sparks, MD      . metoprolol tartrate (LOPRESSOR) tablet 25 mg  25 mg Oral BID Marguarite ArbourJeffrey D Sparks, MD   25 mg at 12/26/15 2052  . morphine 2 MG/ML injection 2 mg  2 mg Intravenous Q2H PRN Marguarite ArbourJeffrey D Sparks, MD   2 mg at 12/27/15 [id number]  . mycophenolate (CELLCEPT) capsule 250 mg  250 mg Oral BID Marguarite ArbourJeffrey D Sparks, MD   250 mg at 12/26/15 2054  . ondansetron (ZOFRAN) tablet 4 mg  4  mg Oral Q6H PRN Marguarite Arbour, MD       Or  . ondansetron [facility]) injection 4 mg  4 mg Intravenous Q6H PRN Marguarite Arbour, MD      . pantoprazole (PROTONIX) EC tablet 40 mg  40 mg Oral Daily Marguarite Arbour, MD      . piperacillin-tazobactam (ZOSYN) IVPB 3.375 g  3.375 g Intravenous Q8H Foye Deer, RPH   3.375 g at 12/27/15 9147  . vancomycin (VANCOCIN) 1,500 mg in sodium chloride 0.9 % 500 mL IVPB  1,500 mg Intravenous Q12H Foye Deer, RPH   1,500 mg at 12/27/15 8295    Past Medical History:  Diagnosis Date  . Diabetes mellitus without complication (HCC)   . DJD (degenerative joint disease)   . Hypertension   . Myasthenia gravis (HCC)   . Renal disorder     History reviewed. No pertinent surgical history.  Social History Social History  Substance Use Topics  . Smoking status: Never Smoker  . Smokeless tobacco: Never Used  . Alcohol use Not on file    Family  History History reviewed. No pertinent family history.  Denies family history of PAD, Renal Disease or bleeding / clotting disorder.   Allergies  Allergen Reactions  . Other Other (See Comments)    Patient states he can't take any "mycin" medications because myasthenia gravis.   REVIEW OF SYSTEMS (Negative unless checked)  Constitutional: [] Weight loss  [] Fever  [] Chills Cardiac: [] Chest pain   [] Chest pressure   [] Palpitations   [] Shortness of breath when laying flat   [] Shortness of breath at rest   [] Shortness of breath with exertion. Vascular:  [] Pain in legs with walking   [] Pain in legs at rest   [] Pain in legs when laying flat   [] Claudication   [x] Pain in feet when walking  [x] Pain in feet at rest  [x] Pain in feet when laying flat   [] History of DVT   [] Phlebitis   [x] Swelling in legs   [x] Varicose veins   [x] Non-healing ulcers Pulmonary:   [] Uses home oxygen   [] Productive cough   [] Hemoptysis   [] Wheeze  [] COPD   [] Asthma Neurologic:  [] Dizziness  [] Blackouts   [] Seizures   [] History of stroke   [] History of TIA  [] Aphasia   [] Temporary blindness   [] Dysphagia   [] Weakness or numbness in arms   [] Weakness or numbness in legs Musculoskeletal:  [] Arthritis   [] Joint swelling   [] Joint pain   [] Low back pain Hematologic:  [] Easy bruising  [] Easy bleeding   [] Hypercoagulable state   [] Anemic  [] Hepatitis Gastrointestinal:  [] Blood in stool   [] Vomiting blood  [] Gastroesophageal reflux/heartburn   [] Difficulty swallowing. Genitourinary:  [] Chronic kidney disease   [] Difficult urination  [] Frequent urination  [] Burning with urination   [] Blood in urine Skin:  [] Rashes   [x] Ulcers   [x] Wounds Psychological:  [] History of anxiety   []  History of major depression.  Physical Examination  Vitals:   12/26/15 1927 12/26/15 2100 12/27/15 0358 12/27/15 0443  BP: 139/90  122/83   Pulse: 83  69   Resp:   18   Temp: 97.7 F (36.5 C)  97.5 F (36.4 C)   TempSrc: Oral  Oral   SpO2: 100%  97%    Weight:  93.5 kg (206 lb 1.6 oz)  93.4 kg (206 lb)  Height:       Body mass index is 29.56 kg/m. Gen:  WD/WN, NAD Head: [place]/AT, No temporalis wasting. Prominent temp  pulse not noted. Ear/Nose/Throat: Hearing grossly intact, nares w/o erythema or drainage, oropharynx w/o Erythema/Exudate Eyes: PERRLA, EOMI.  Neck: Supple, no nuchal rigidity.  No bruit or JVD.  Pulmonary:  Good air movement, clear to auscultation bilaterally.  Cardiac: RRR, normal S1, S2, no Murmurs, rubs or gallops. Vascular:  Vessel Right Left  Radial Palpable Palpable  Ulnar Palpable Palpable  Brachial Palpable Palpable  Carotid Palpable, without bruit Palpable, without bruit  Aorta Not palpable N/A  Femoral Palpable Palpable  Popliteal Palpable Palpable  PT Palpable Palpable  DP Palpable Palpable   Left Lower Extremity: Small non-infected ulceration noted on bottom of left foot second toe. No cellulitis.   Gastrointestinal: soft, non-tender/non-distended. No guarding/reflex. No masses, surgical incisions, or scars. Musculoskeletal: M/S 5/5 throughout.  Extremities without ischemic changes.  No deformity or atrophy. No edema. Neurologic: CN 2-12 intact. Pain and light touch intact in extremities.  Symmetrical.  Speech is fluent. Motor exam as listed above. Psychiatric: Judgment intact, Mood & affect appropriate for pt's clinical situation. Dermatologic: No rashes or ulcers noted.   Lymph : No Cervical, Axillary, or Inguinal lymphadenopathy.  CBC Lab Results  Component Value Date   WBC 6.3 12/27/2015   HGB 12.6 (L) 12/27/2015   HCT 37.5 (L) 12/27/2015   MCV 95.4 12/27/2015   PLT 232 12/27/2015   BMET    Component Value Date/Time   NA 135 12/27/2015 0412   K 3.3 (L) 12/27/2015 0412   CL 99 (L) 12/27/2015 0412   CO2 29 12/27/2015 0412   GLUCOSE 119 (H) 12/27/2015 0412   BUN 13 12/27/2015 0412   CREATININE 0.83 12/27/2015 0412   CALCIUM 8.6 (L) 12/27/2015 0412   GFRNONAA >60 12/27/2015 0412   GFRAA  >60 12/27/2015 0412   Estimated Creatinine Clearance: 116.9 mL/min (by C-G formula based on SCr of 0.83 mg/dL).  COAG No results found for: INR, PROTIME  Radiology Dg Foot Complete Left  Result Date: 12/26/2015 CLINICAL DATA:  Left foot swelling, second toe ulcer EXAM: LEFT FOOT - COMPLETE 3+ VIEW COMPARISON:  None. FINDINGS: Three views of the left foot submitted. No acute fracture or subluxation. There is soft tissue swelling dorsal tarsal and metatarsal region. Degenerative changes are noted first metatarsal phalangeal joint. Diffuse soft tissue swelling noted second toe. Subtle erosive changes are noted at the tip of distal phalanx second toe. Findings are highly suspicious for osteomyelitis. Clinical correlation is necessary. Further correlation with MRI could be performed as clinically warranted. IMPRESSION: No acute fracture or subluxation. There is soft tissue swelling dorsal tarsal and metatarsal region. Degenerative changes are noted first metatarsal phalangeal joint. Diffuse soft tissue swelling noted second toe. Subtle erosive changes are noted at the tip of distal phalanx second toe. Findings are highly suspicious for osteomyelitis. Clinical correlation is necessary. Further correlation with MRI could be performed as clinically warranted. Electronically Signed   By: Natasha MeadLiviu  Pop M.D.   On: 12/26/2015 17:23   Assessment/Plan 55 year old male admitted with left toe ulceration with possible osteomyelitis  1) Possible PAD - Will perform left lower extremity angiogram today to assess if any PAD is contributing to patients ulcer formation. 2) DM - good control encouraged and this may be primary reason patient has developed ulceration 3) Possible venous disease - patient to follow up as an outpatient for further workup. 4) Would consult podiatry for further local wound care and will need podiatrist in area as outpatient.  5) Discussed with Dr. Weldon Inchesew   Janille Draughon A Lynsee Wands,  PA-C  12/27/2015 8:11 AM

## 2015-12-27 NOTE — Therapy (Signed)
Placed patient on Auto-BIPAP with 2 lpm oxygen bleed to be used during Vascular Procedure.  Patient placed on a Medium Full Face Mask.  Pt tolerated well.

## 2015-12-27 NOTE — Care Management Note (Signed)
Case Management Note  Patient Details  Name: Kurt Horton MRN: [id number] Date of Birth: 1960/07/04  Subjective/Objective:    Spoke with patient for discharge plan . Patient is unemployed with no income and living with his father.    Moved here 4 weeks ago. Gave patient applications for  [facility] and [facility] and also provider list for [facility].    Patient stated that he has a glucometer and strips and that he takes Metformin and has a supply of this medication. He stated he does not take insulin.Patient was more concerned about his Fentanyl refills which I explained would not be filled by charity clinics.  Patient has not DME and stated that he still drives self.  Discussed application process for each clinic and sent referrals to Lelon MastLorrie Holt at [facility] and Sammuel Hinesita Parsons at [facility].       Action/Plan: Home with self care Help with meds.   Expected Discharge Date:                  Expected Discharge Plan:  Home/Self Care  In-House Referral:     Discharge planning Services  [facility], [facility]  Post Acute Care Choice:  NA Choice offered to:     DME Arranged:    DME Agency:     HH Arranged:    HH Agency:     Status of Service:  In process, will continue to follow  If discussed at Long Length of Stay Meetings, dates discussed:    Additional Comments:  Adonis HugueninBerkhead, Cleatis Fandrich L, RN 12/27/2015, 1:41 PM

## 2015-12-27 NOTE — Op Note (Addendum)
[facility] Percutaneous Study/Intervention Procedural Note   Date of Surgery: 12/27/2015  Surgeon(s):Kwali Wrinkle   Assistants:none  Pre-operative Diagnosis: osteomyelitis and ulceration in left foot, DM  Post-operative diagnosis: Same  Procedure(s) Performed: 1. Ultrasound guidance for vascular access right femoral artery 2. Catheter placement into left SFA from right femoral approach 3. Aortogram and selective left lower extremity angiogram 4. StarClose closure device right femoral artery  EBL: 10 cc  Contrast: 60 cc  Fluoro Time: 1.6 minutes  Moderate Conscious Sedation Time: approximately 30 minutes using 4 mg of Versed and 100 mcg of Fentanyl  Indications: Patient is a 55 y.o.male with osteomyelitis and infection of his left foot with long-standing diabetes mellitus. With no duplex arterial studies at our institution, angiogram is indicated for full evaluation of his perfusion for healing potential and potential level of amputation. The patient is brought in for angiography for further evaluation and potential treatment. Risks and benefits are discussed and informed consent is obtained  Procedure: The patient was identified and appropriate procedural time out was performed. The patient was then placed supine on the table and prepped and draped in the usual sterile fashion.Moderate conscious sedation was administered during a face to face encounter with the patient throughout the procedure with my supervision of the RN administering medicines and monitoring the patient's vital signs, pulse oximetry, telemetry and mental status throughout from the start of the procedure until the patient was taken to the recovery room. Ultrasound was used to evaluate the right common femoral artery. It was patent . A digital ultrasound image was acquired. A Seldinger needle was used to access the right  common femoral artery under direct ultrasound guidance and a permanent image was performed. A 0.035 J wire was advanced without resistance and a 5Fr sheath was placed. Pigtail catheter was placed into the aorta and an AP aortogram was performed. This demonstrated normal renal arteries and normal aorta and iliac segments without significant stenosis. I then crossed the aortic bifurcation and advanced to the left femoral head. Selective left lower extremity angiogram was then performed. For best opacification of the tibial vessels to limit contrast, the catheter was advanced to the mid to distal superficial femoral artery after the proximal leg imaging was performed. This demonstrated normal common femoral artery, profunda femoris artery, superficial femoral artery, and popliteal arteries. Normal tibial trifurcation was two-vessel runoff to the foot through the posterior tibial and anterior tibial arteries without significant stenosis. The patient had essentially normal perfusion with adequate flow for any surgical procedure of the foot. No intervention was indicated. I elected to terminate the procedure. The sheath was removed and StarClose closure device was deployed in the right femoral artery with excellent hemostatic result. The patient was taken to the recovery room in stable condition having tolerated the procedure well.  Findings:  Aortogram: This demonstrated normal renal arteries and normal aorta and iliac segments without significant stenosis Left Lower Extremity: Normal common femoral artery, profunda femoris artery, superficial femoral artery, and popliteal arteries. Normal tibial trifurcation was two-vessel runoff to the foot through the posterior tibial and anterior tibial arteries without significant stenosis.   Disposition: Patient was taken to the recovery room in stable condition having tolerated the procedure well.  Complications:  None  Susannah Carbin 12/27/2015 3:41 PM

## 2015-12-27 NOTE — Progress Notes (Signed)
New Admission Note:   Arrival Method: per bed from ED, pt came from home Mental Orientation: alert and oriented X4 Telemetry: none ordered Assessment: Completed Skin: warm, dry, with redness and swelling noted on the left lower leg and left foot. Diabetic ulcer noted on the left 2nd toe, no drainage. Scab noted on the right pinkie toe. IV: G20 on the left forearm with transparent dressing, intact. Pain: 8/10 scale on the left foot, will give prn pain medicine. Safety Measures: Safety Fall Prevention Plan has been given and discussed Admission: Completed 1A Orientation: Patient has been oriented to the room, unit and staff.  Family: father at bedside  Orders have been reviewed and implemented. Will continue to monitor the patient. Call light has been placed within reach and bed alarm has been activated.   Janice NorrieAnessa Karmine Kauer BSN, RN [facility] 1A

## 2015-12-27 NOTE — H&P (Signed)
  [facility] History & Physical Update  The patient was interviewed and re-examined.  The patient's previous History and Physical has been reviewed and is unchanged.  There is no change in the plan of care. We plan to proceed with the scheduled procedure.  DEW,JASON, MD  12/27/2015, 2:12 PM

## 2015-12-27 NOTE — Consult Note (Signed)
WOC Nurse wound consult note Reason for Consult: Diabetic ulcer to left plantar surface 2nd toe Wound type: Neuropathic/Diabet ulcer Pressure Ulcer POA: NA Measurement: 0.6 x 0.5 x 0.1 Wound bed: Pink, dry, flaky Drainage (amount, consistency, odor) none Periwound: swollen, warm, induration Dressing procedure/placement/frequency: Elevation.  Instructed pt on diabetic foot care ie wash feet with soap and water daily, apply daily moisturizer, wear socks and good fitting shoes at all times, inspect feet daily.  Pt has vascular consult with angiogram pending today.  Podiatry consult also pending.  Discussed POC with patient and bedside nurse.  Re consult if needed, will not follow at this time. Thanks Henriette CombsSarah Marielena Harvell BSN, RN, [facility]

## 2015-12-28 LAB — SURGICAL PCR SCREEN
MRSA, PCR: NEGATIVE
Staphylococcus aureus: POSITIVE — AB

## 2015-12-28 LAB — BASIC METABOLIC PANEL
ANION GAP: 5 (ref 5–15)
BUN: 12 mg/dL (ref 6–20)
CHLORIDE: 102 mmol/L (ref 101–111)
CO2: 30 mmol/L (ref 22–32)
Calcium: 8.6 mg/dL — ABNORMAL LOW (ref 8.9–10.3)
Creatinine, Ser: 0.89 mg/dL (ref 0.61–1.24)
GFR calc Af Amer: >60 mL/min (ref >60)
Glucose, Bld: 137 mg/dL — ABNORMAL HIGH (ref 65–99)
POTASSIUM: 3.7 mmol/L (ref 3.5–5.1)
SODIUM: 137 mmol/L (ref 135–145)

## 2015-12-28 LAB — GLUCOSE, CAPILLARY
GLUCOSE-CAPILLARY: 107 mg/dL — AB (ref 65–99)
GLUCOSE-CAPILLARY: 109 mg/dL — AB (ref 65–99)
Glucose-Capillary: 113 mg/dL — ABNORMAL HIGH (ref 65–99)
Glucose-Capillary: 164 mg/dL — ABNORMAL HIGH (ref 65–99)

## 2015-12-28 LAB — MAGNESIUM: MAGNESIUM: 1.8 mg/dL (ref 1.7–2.4)

## 2015-12-28 MED ORDER — MORPHINE SULFATE (PF) 4 MG/ML IV SOLN
4.0000 mg | INTRAVENOUS | Status: DC | PRN
  Administered 2015-12-28 – 2015-12-30 (×8): 4 mg via INTRAVENOUS
  Filled 2015-12-28 (×8): qty 1

## 2015-12-28 MED ORDER — MUPIROCIN 2 % EX OINT
1.0000 "application " | TOPICAL_OINTMENT | Freq: Two times a day (BID) | CUTANEOUS | Status: DC
  Administered 2015-12-28 – 2015-12-29 (×4): 1 via NASAL
  Filled 2015-12-28: qty 22

## 2015-12-28 MED ORDER — CHLORHEXIDINE GLUCONATE CLOTH 2 % EX PADS
6.0000 | MEDICATED_PAD | Freq: Every day | CUTANEOUS | Status: DC
  Administered 2015-12-28 – 2015-12-30 (×3): 6 via TOPICAL

## 2015-12-28 MED ORDER — PSYLLIUM 95 % PO PACK: 1.0000 | PACK | Freq: Every day | ORAL | Status: DC | PRN

## 2015-12-28 MED ORDER — IOPAMIDOL (ISOVUE-300) INJECTION 61%
INTRAVENOUS | Status: DC | PRN
  Administered 2015-12-27: 60 mL via INTRA_ARTERIAL

## 2015-12-28 NOTE — Progress Notes (Signed)
Patient wanted to walk around nursing unit again. Patient signed out this time. Stated he needed to make a phone call. Is now back in room.

## 2015-12-28 NOTE — Progress Notes (Signed)
Patient wanted to walk around nursing unit. Patient educated on if he walks off the unit, that he needs to sign out. Someone from visitors entrance called stating that patient was at visitors entrance outside smoking. Security called to escort patient back inside. Patient is now in room.

## 2015-12-28 NOTE — Progress Notes (Signed)
[facility] PROGRESS NOTE  Kurt PhenesJohn D Horton WUJ:[id number] DOB: August 15, 1960 DOA: 12/26/2015 PCP: No primary care provider on file.  HPI/Subjective: Patient with feet swelling. Also some pain in the tip of his toe and ulceration. Yesterday he stated the area was red surrounding.  Objective: Vitals:   12/28/15 0723 12/28/15 1420  BP: 119/63 110/70  Pulse: 72 62  Resp: 18 18  Temp: 98 F (36.7 C) 97.9 F (36.6 C)    Filed Weights   12/26/15 2100 12/27/15 0443 12/28/15 0500  Weight: 93.5 kg (206 lb 1.6 oz) 93.4 kg (206 lb) 93.9 kg (207 lb 0.2 oz)    ROS: Review of Systems  Constitutional: Negative for chills and fever.  Eyes: Negative for blurred vision.  Respiratory: Negative for cough and shortness of breath.   Cardiovascular: Negative for chest pain.  Gastrointestinal: Negative for abdominal pain, constipation, diarrhea, nausea and vomiting.  Genitourinary: Negative for dysuria.  Musculoskeletal: Positive for joint pain.  Neurological: Negative for dizziness and headaches.   Exam: Physical Exam  Constitutional: He is oriented to person, place, and time.  HENT:  Nose: No mucosal edema.  Mouth/Throat: No oropharyngeal exudate or posterior oropharyngeal edema.  Eyes: Conjunctivae, EOM and lids are normal. Pupils are equal, round, and reactive to light.  Neck: No JVD present. Carotid bruit is not present. No edema present. No thyroid mass and no thyromegaly present.  Cardiovascular: S1 normal and S2 normal.  Exam reveals no gallop.   No murmur heard. Pulses:      Dorsalis pedis pulses are 2+ on the right side, and 2+ on the left side.  Respiratory: No respiratory distress. He has no wheezes. He has no rhonchi. He has no rales.  GI: Soft. Bowel sounds are normal. There is no tenderness.  Musculoskeletal:       Right ankle: He exhibits no swelling.       Left ankle: He exhibits no swelling.  Lymphadenopathy:    He has no cervical adenopathy.  Neurological: He is alert  and oriented to person, place, and time. No cranial nerve deficit.  Skin: Skin is warm. Nails show no clubbing.  Dry skin bilateral feet. Left second toe small little ulcer. Whenever erythema on the toe seems to have settled down. Right foot fifth MT joint some swelling there.  Psychiatric: He has a normal mood and affect.      Data Reviewed: Basic Metabolic Panel:  Recent Labs Lab 12/26/15 1641 12/27/15 0412 12/28/15 0432  NA 134* 135 137  K 3.3* 3.3* 3.7  CL 95* 99* 102  CO2 27 29 30   GLUCOSE 167* 119* 137*  BUN 10 13 12   CREATININE 0.92 0.83 0.89  CALCIUM 8.9 8.6* 8.6*  MG  --   --  1.8   Liver Function Tests:  Recent Labs Lab 12/26/15 1641 12/27/15 0412  AST 29 18  ALT 17 13*  ALKPHOS 79 64  BILITOT 0.7 0.3  PROT 7.7 6.3*  ALBUMIN 4.0 3.1*   CBC:  Recent Labs Lab 12/26/15 1641 12/27/15 0412  WBC 11.0* 6.3  NEUTROABS 7.6*  --   HGB 14.4 12.6*  HCT 41.4 37.5*  MCV 96.1 95.4  PLT 250 232    CBG:  Recent Labs Lab 12/27/15 1210 12/27/15 1740 12/27/15 2055 12/28/15 0718 12/28/15 1106  GLUCAP 113* 97 131* 107* 109*    Recent Results (from the past 240 hour(s))  Blood culture (routine x 2)     Status: None (Preliminary result)   Collection  Time: 12/26/15  5:44 PM  Result Value Ref Range Status   Specimen Description BLOOD LEFT FOREARM  Final   Special Requests BOTTLES DRAWN AEROBIC AND ANAEROBIC 12CC  Final   Culture NO GROWTH 2 DAYS  Final   Report Status PENDING  Incomplete  Blood culture (routine x 2)     Status: None (Preliminary result)   Collection Time: 12/26/15  6:00 PM  Result Value Ref Range Status   Specimen Description BLOOD RIGHT ANTECUBITAL  Final   Special Requests   Final    BOTTLES DRAWN AEROBIC AND ANAEROBIC  AER 12CC ANA 15CC   Culture NO GROWTH 2 DAYS  Final   Report Status PENDING  Incomplete  Aerobic/Anaerobic Culture (surgical/deep wound)     Status: None (Preliminary result)   Collection Time: 12/28/15  9:13 AM   Result Value Ref Range Status   Specimen Description ULCER  Final   Special Requests Immunocompromised  Final   Gram Stain   Final    FEW WBC PRESENT, PREDOMINANTLY PMN RARE GRAM POSITIVE COCCOBACILLUS Performed at [facility]    Culture PENDING  Incomplete   Report Status PENDING  Incomplete  Surgical pcr screen     Status: Abnormal   Collection Time: 12/28/15 12:00 PM  Result Value Ref Range Status   MRSA, PCR NEGATIVE NEGATIVE Final   Staphylococcus aureus POSITIVE (A) NEGATIVE Final    Comment:        The Xpert SA Assay (FDA approved for NASAL specimens in patients over 66 years of age), is one component of a comprehensive surveillance program.  Test performance has been validated by [facility] for patients greater than or equal to 25 year old. It is not intended to diagnose infection nor to guide or monitor treatment.      Studies: Dg Foot Complete Left  Result Date: 12/26/2015 CLINICAL DATA:  Left foot swelling, second toe ulcer EXAM: LEFT FOOT - COMPLETE 3+ VIEW COMPARISON:  None. FINDINGS: Three views of the left foot submitted. No acute fracture or subluxation. There is soft tissue swelling dorsal tarsal and metatarsal region. Degenerative changes are noted first metatarsal phalangeal joint. Diffuse soft tissue swelling noted second toe. Subtle erosive changes are noted at the tip of distal phalanx second toe. Findings are highly suspicious for osteomyelitis. Clinical correlation is necessary. Further correlation with MRI could be performed as clinically warranted. IMPRESSION: No acute fracture or subluxation. There is soft tissue swelling dorsal tarsal and metatarsal region. Degenerative changes are noted first metatarsal phalangeal joint. Diffuse soft tissue swelling noted second toe. Subtle erosive changes are noted at the tip of distal phalanx second toe. Findings are highly suspicious for osteomyelitis. Clinical correlation is necessary. Further correlation  with MRI could be performed as clinically warranted. Electronically Signed   By: Natasha Mead M.D.   On: 12/26/2015 17:23    Scheduled Meds: . aspirin EC  81 mg Oral Daily  .  ceFAZolin (ANCEF) IV  1 g Intravenous On Call  . Chlorhexidine Gluconate Cloth  6 each Topical Daily  . citalopram  20 mg Oral Daily  . docusate sodium  100 mg Oral BID  . fentaNYL  50 mcg Transdermal Q72H  . gabapentin  600 mg Oral TID  . heparin  5,000 Units Subcutaneous Q8H  . insulin aspart  0-9 Units Subcutaneous TID WC  . levofloxacin  500 mg Oral Daily  . losartan  50 mg Oral Daily  . metoprolol tartrate  25 mg Oral BID  .  mupirocin ointment  1 application Nasal BID  . pantoprazole  40 mg Oral Daily  . tolnaftate   Topical BID    Assessment/Plan:  1. Cellulitis with Left second toe ulceration. X-ray commented on possible osteomyelitis but sedimentation rate only 44. Patient placed on aggressive antibiotics with vancomycin and Zosyn. Patient states that he cannot take any mycins and wants to be on Levaquin. I switched him over to Levaquin. Podiatry to do a distal toe amputation tomorrow. 2. Preoperative evaluation. No contraindications to surgery at this time. 3. Myasthenia gravis. Case discussed with neurologist no preoperative steroids needed. Mestinon can be given for symptoms. I will have pharmacy clarify on whether CellCept was actually taken as outpatient 4. Essential hypertension on metoprolol and losartan 5. Chronic pain on fentanyl patch, gabapentin 6. Type 2 diabetes mellitus continue sliding scale 7. Fungal infection feet start Lac-Hydrin lotion and Tinactin.  Code Status:     Code Status Orders        Start     Ordered   12/26/15 1928  Full code  Continuous     12/26/15 1927    Code Status History    Date Active Date Inactive Code Status Order ID Comments User Context   This patient has a current code status but no historical code status.     Disposition Plan: Home Potentially  tomorrow evening versus Thursday.  Consultants:  Podiatry  Procedures:  Angiogram  Antibiotics:  Levaquin  Time spent: 22 minutes  Alford HighlandWIETING, Arturo Freundlich  [employer]

## 2015-12-28 NOTE — Progress Notes (Signed)
[facility]  Daily Progress Note   Subjective: 1 Day Post-Op: Ultrasound guidance for vascular access right femoral artery, Catheter placement into left SFA from right femoral approach, Aortogram and selective left lower extremity angiogram and StarClose closure device right femoral artery.  Patient without complaint this AM. Discussed results from yesterday's angiogram. No significant stenosis found. Awaiting podiatry consult. Seen by wound nurse. Discussed outpatient follow up for bilateral lower extremity edema.   Objective: Vitals:   12/27/15 2030 12/28/15 0402 12/28/15 0500 12/28/15 0723  BP: 107/63 120/69  119/63  Pulse:  71  72  Resp:  19  18  Temp:  98.4 F (36.9 C)  98 F (36.7 C)  TempSrc:  Oral  Oral  SpO2:  98%  99%  Weight:   93.9 kg (207 lb 0.2 oz)   Height:        Intake/Output Summary (Last 24 hours) at 12/28/15 [id number] Last data filed at 12/28/15 0747  Gross per 24 hour  Intake          4287.17 ml  Output             1400 ml  Net          2887.17 ml   Physical Exam: A&Ox3, NAD CV: RRR Pulmonary: CTA Bilaterally Abdomen: Soft, Nontender, Nondistended Right Groin: OR dressing inplace, no swelling or drainage noted. Vascular: 2+ pedal pulses bilaterally. Moderate edema Left Lower Extremity: Small non-infected ulceration noted on bottom of left foot second toe. No cellulitis.    Laboratory: CBC    Component Value Date/Time   WBC 6.3 12/27/2015 0412   HGB 12.6 (L) 12/27/2015 0412   HCT 37.5 (L) 12/27/2015 0412   PLT 232 12/27/2015 0412   BMET    Component Value Date/Time   NA 137 12/28/2015 0432   K 3.7 12/28/2015 0432   CL 102 12/28/2015 0432   CO2 30 12/28/2015 0432   GLUCOSE 137 (H) 12/28/2015 0432   BUN 12 12/28/2015 0432   CREATININE 0.89 12/28/2015 0432   CALCIUM 8.6 (L) 12/28/2015 0432   GFRNONAA >60 12/28/2015 0432   GFRAA >60 12/28/2015 0432   Assessment/Planning: 55 year old male s/p left lower extremity  angiogram for left toe ulceration - stable 1) No stenosis found on angiogram. 2) Patient to follow up as outpatient for possible reflux vs lymphedema workup. 3) Will sign off for now. 4) Discussed with Dr. Wallis Martew  Kimberly Weisman Childrens Rehabilitation Hospitaltegmayer PA-C 12/28/2015 8:12 AM

## 2015-12-28 NOTE — Care Management (Signed)
Clarified with patient that he has applied for Medicaid but does not have it yet . 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

## 2015-12-28 NOTE — Consult Note (Signed)
ORTHOPAEDIC CONSULTATION  REQUESTING PHYSICIAN: Alford Highlandichard Wieting, MD  Chief Complaint: Second toe infection  HPI: Kurt PhenesJohn D Horton is a 55 y.o. male who complains of  ulceration to his left second toe. He states this has progressed over the last 3-4 weeks. Presented to the ER with a swollen left second toe. Image showed possible infection on the distal phalanx of the second toe. Patient underwent angiogram yesterday with excellent blood flow. He has a long-standing history of diabetes. He unfortunately has been without health insurance and has not been seen by physician of recent. He just recently relocated from an outside stay.  Past Medical History:  Diagnosis Date  . Diabetes mellitus without complication (HCC)   . DJD (degenerative joint disease)   . Hypertension   . Myasthenia gravis (HCC)   . Renal disorder    History reviewed. No pertinent surgical history. Social History   Social History  . Marital status: Legally Separated    Spouse name: N/A  . Number of children: N/A  . Years of education: N/A   Social History Main Topics  . Smoking status: Never Smoker  . Smokeless tobacco: Never Used  . Alcohol use None  . Drug use: Unknown  . Sexual activity: Not Asked   Other Topics Concern  . None   Social History Narrative  . None   History reviewed. No pertinent family history. Allergies  Allergen Reactions  . Other Other (See Comments)    Patient states he can't take any "mycin" medications because myasthenia gravis.   Prior to Admission medications   Medication Sig Start Date End Date Taking? Authorizing Provider  chlordiazePOXIDE (LIBRIUM) 25 MG capsule Take 25 mg by mouth 3 (three) times daily as needed for withdrawal.   Yes Historical Provider, MD  diclofenac (FLECTOR) 1.3 % PTCH Place 1 patch onto the skin 2 (two) times daily.   Yes Historical Provider, MD  fentaNYL (DURAGESIC - DOSED MCG/HR) 50 MCG/HR Place 50 mcg onto the skin every 3 (three) days.   Yes  Historical Provider, MD  furosemide (LASIX) 40 MG tablet Take 40 mg by mouth daily.   Yes Historical Provider, MD  gabapentin (NEURONTIN) 300 MG capsule Take 600 mg by mouth 3 (three) times daily.   Yes Historical Provider, MD  oxyCODONE (ROXICODONE) 15 MG immediate release tablet Take 15 mg by mouth every 12 (twelve) hours as needed for pain.   Yes Historical Provider, MD  traZODone (DESYREL) 100 MG tablet Take 100 mg by mouth at bedtime.   Yes Historical Provider, MD   Dg Foot Complete Left  Result Date: 12/26/2015 CLINICAL DATA:  Left foot swelling, second toe ulcer EXAM: LEFT FOOT - COMPLETE 3+ VIEW COMPARISON:  None. FINDINGS: Three views of the left foot submitted. No acute fracture or subluxation. There is soft tissue swelling dorsal tarsal and metatarsal region. Degenerative changes are noted first metatarsal phalangeal joint. Diffuse soft tissue swelling noted second toe. Subtle erosive changes are noted at the tip of distal phalanx second toe. Findings are highly suspicious for osteomyelitis. Clinical correlation is necessary. Further correlation with MRI could be performed as clinically warranted. IMPRESSION: No acute fracture or subluxation. There is soft tissue swelling dorsal tarsal and metatarsal region. Degenerative changes are noted first metatarsal phalangeal joint. Diffuse soft tissue swelling noted second toe. Subtle erosive changes are noted at the tip of distal phalanx second toe. Findings are highly suspicious for osteomyelitis. Clinical correlation is necessary. Further correlation with MRI could be performed as clinically warranted.  Electronically Signed   By: Natasha MeadLiviu  Pop M.D.   On: 12/26/2015 17:23    Positive ROS: All other systems have been reviewed and were otherwise negative with the exception of those mentioned in the HPI and as above.  12 point ROS was performed.  Physical Exam: General: Alert and oriented.  No apparent distress.  Vascular:  Left foot:Dorsalis Pedis:   present Posterior Tibial:  diminished  Right foot: Dorsalis Pedis:  present Posterior Tibial:  diminished  Neuro:absent protective sensation to the toes  Derm: There is a noted ulceration on the tip of the left second toe. Fibrotic tissue that probes directly down to bone. No purulent drainage. Mild diffuse erythema to the second toe. Diffuse edema to the left second toe as well. No other ulcerative sites at this point.  Ortho/MS: Focal diffuse edema of the left second toe is noted today. Minimal hammertoe contracture of the tip of the second toe is noted. Mild prominent of the fifth MTPJ laterally on the right. Diffuse edema to the left foot and ankle is noted.  Xray: I reviewed the left foot x-rays that shows a small erosion on the distal tip of the left second toe at the site of the ulcer.   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 We discussed the postoperative course of associated with the amputation versus the course associated with local wound care and likely long-term IV antibiotics. This time he would like to proceed with the amputation. The risks benefits alternatives and competitions associated with the surgery were discussed with patient for consent has been given. Will plan for surgery tomorrow. Patient will be nothing by mouth tonight. After surgery he will likely be able to be discharged from podiatry standpoint when internal medicine is ready.    Irean HongFowler, Divine Imber A, DPM Cell [phone]   12/28/2015 8:20 AM

## 2015-12-29 ENCOUNTER — Encounter: Payer: Self-pay | Admitting: Vascular Surgery

## 2015-12-29 ENCOUNTER — Inpatient Hospital Stay: Payer: Self-pay | Admitting: Anesthesiology

## 2015-12-29 ENCOUNTER — Encounter
Admission: EM | Disposition: A | Discharge status: Home / Self Care | Payer: Self-pay | Source: Home / Self Care | Attending: Internal Medicine

## 2015-12-29 DIAGNOSIS — L8990 Pressure ulcer of unspecified site, unspecified stage: Secondary | ICD-10-CM | POA: Insufficient documentation

## 2015-12-29 HISTORY — PX: AMPUTATION TOE: SHX6595

## 2015-12-29 LAB — GLUCOSE, CAPILLARY
GLUCOSE-CAPILLARY: 107 mg/dL — AB (ref 65–99)
GLUCOSE-CAPILLARY: 84 mg/dL (ref 65–99)
GLUCOSE-CAPILLARY: 155 mg/dL — AB (ref 65–99)
Glucose-Capillary: 102 mg/dL — ABNORMAL HIGH (ref 65–99)

## 2015-12-29 SURGERY — AMPUTATION, TOE
Anesthesia: General | Laterality: Left

## 2015-12-29 MED ORDER — LACTATED RINGERS IV SOLN
INTRAVENOUS | Status: DC | PRN
  Administered 2015-12-29: 14:00:00 via INTRAVENOUS

## 2015-12-29 MED ORDER — LIDOCAINE HCL (CARDIAC) 20 MG/ML IV SOLN
INTRAVENOUS | Status: DC | PRN
  Administered 2015-12-29: 80 mg via INTRAVENOUS

## 2015-12-29 MED ORDER — FENTANYL CITRATE (PF) 100 MCG/2ML IJ SOLN
INTRAMUSCULAR | Status: DC | PRN
  Administered 2015-12-29 (×2): 50 ug via INTRAVENOUS

## 2015-12-29 MED ORDER — BUPIVACAINE HCL 0.5 % IJ SOLN
INTRAMUSCULAR | Status: DC | PRN
  Administered 2015-12-29: 4 mL

## 2015-12-29 MED ORDER — PROPOFOL 10 MG/ML IV BOLUS
INTRAVENOUS | Status: DC | PRN
  Administered 2015-12-29: 140 mg via INTRAVENOUS

## 2015-12-29 MED ORDER — CEFAZOLIN SODIUM-DEXTROSE 2-4 GM/100ML-% IV SOLN
2.0000 g | INTRAVENOUS | Status: AC
  Administered 2015-12-29: 2 g via INTRAVENOUS
  Filled 2015-12-29 (×2): qty 100

## 2015-12-29 MED ORDER — LIDOCAINE HCL (PF) 1 % IJ SOLN
INTRAMUSCULAR | Status: DC | PRN
  Administered 2015-12-29: 4 mL

## 2015-12-29 MED ORDER — PROMETHAZINE HCL 25 MG/ML IJ SOLN: 6.2500 mg | INTRAMUSCULAR | Status: DC | PRN

## 2015-12-29 MED ORDER — FENTANYL CITRATE (PF) 100 MCG/2ML IJ SOLN: 25.0000 ug | INTRAMUSCULAR | Status: DC | PRN

## 2015-12-29 MED ORDER — LIDOCAINE HCL (PF) 1 % IJ SOLN
INTRAMUSCULAR | Status: AC
  Filled 2015-12-29: qty 30

## 2015-12-29 MED ORDER — CHLORHEXIDINE GLUCONATE 4 % EX LIQD
60.0000 mL | Freq: Once | CUTANEOUS | Status: AC
  Administered 2015-12-29: 4 via TOPICAL

## 2015-12-29 MED ORDER — POVIDONE-IODINE 10 % EX SWAB
2.0000 "application " | Freq: Once | CUTANEOUS | Status: AC
  Administered 2015-12-29: 2 via TOPICAL

## 2015-12-29 MED ORDER — BUPIVACAINE HCL (PF) 0.5 % IJ SOLN
INTRAMUSCULAR | Status: AC
  Filled 2015-12-29: qty 30

## 2015-12-29 SURGICAL SUPPLY — 39 items
BANDAGE ELASTIC 4 LF NS (GAUZE/BANDAGES/DRESSINGS) ×2 IMPLANT
BANDAGE STRETCH 3X4.1 STRL (GAUZE/BANDAGES/DRESSINGS) IMPLANT
BLADE OSC/SAGITTAL MD 5.5X18 (BLADE) ×2 IMPLANT
BLADE SURG MINI STRL (BLADE) IMPLANT
BNDG ESMARK 4X12 TAN STRL LF (GAUZE/BANDAGES/DRESSINGS) ×2 IMPLANT
BNDG GAUZE 4.5X4.1 6PLY STRL (MISCELLANEOUS) ×2 IMPLANT
CANISTER SUCT 1200ML W/VALVE (MISCELLANEOUS) ×2 IMPLANT
DRAPE FLUOR MINI C-ARM 54X84 (DRAPES) ×2 IMPLANT
DRAPE XRAY CASSETTE 23X24 (DRAPES) ×2 IMPLANT
DURAPREP 26ML APPLICATOR (WOUND CARE) ×2 IMPLANT
ELECT REM PT RETURN 9FT ADLT (ELECTROSURGICAL) ×2
ELECTRODE REM PT RTRN 9FT ADLT (ELECTROSURGICAL) ×1 IMPLANT
GAUZE IODOFORM PACK 1/2 7832 (GAUZE/BANDAGES/DRESSINGS) IMPLANT
GAUZE PETRO XEROFOAM 1X8 (MISCELLANEOUS) ×2 IMPLANT
GAUZE SPONGE 4X4 12PLY STRL (GAUZE/BANDAGES/DRESSINGS) ×2 IMPLANT
GAUZE STRETCH 2X75IN STRL (MISCELLANEOUS) IMPLANT
GLOVE BIO SURGEON STRL SZ7.5 (GLOVE) ×10 IMPLANT
GLOVE INDICATOR 8.0 STRL GRN (GLOVE) ×10 IMPLANT
GOWN STRL REUS W/ TWL LRG LVL3 (GOWN DISPOSABLE) ×2 IMPLANT
GOWN STRL REUS W/TWL LRG LVL3 (GOWN DISPOSABLE) ×2
HANDPIECE INTERPULSE COAX TIP (DISPOSABLE)
KIT RM TURNOVER STRD PROC AR (KITS) ×2 IMPLANT
LABEL OR SOLS (LABEL) ×2 IMPLANT
NEEDLE FILTER BLUNT 18X 1/2SAF (NEEDLE) ×1
NEEDLE FILTER BLUNT 18X1 1/2 (NEEDLE) ×1 IMPLANT
NEEDLE HYPO 25X1 1.5 SAFETY (NEEDLE) ×2 IMPLANT
NS IRRIG 500ML POUR BTL (IV SOLUTION) ×2 IMPLANT
PACK EXTREMITY ARMC (MISCELLANEOUS) ×2 IMPLANT
PAD ABD DERMACEA PRESS 5X9 (GAUZE/BANDAGES/DRESSINGS) IMPLANT
SET HNDPC FAN SPRY TIP SCT (DISPOSABLE) IMPLANT
SOL .9 NS 3000ML IRR  AL (IV SOLUTION) ×1
SOL .9 NS 3000ML IRR UROMATIC (IV SOLUTION) ×1 IMPLANT
STOCKINETTE M/LG 89821 (MISCELLANEOUS) ×2 IMPLANT
STRAP SAFETY BODY (MISCELLANEOUS) ×2 IMPLANT
SUT ETHILON 3-0 FS-10 30 BLK (SUTURE) ×2
SUT ETHILON 5-0 FS-2 18 BLK (SUTURE) ×2 IMPLANT
SUT VIC AB 4-0 FS2 27 (SUTURE) ×2 IMPLANT
SUTURE EHLN 3-0 FS-10 30 BLK (SUTURE) ×1 IMPLANT
SYRINGE 10CC LL (SYRINGE) ×6 IMPLANT

## 2015-12-29 NOTE — H&P (Signed)
HISTORY AND PHYSICAL INTERVAL NOTE:  12/29/2015  1:04 PM  Kurt PhenesJohn D Forse  has presented today for surgery, with the diagnosis of osteomyelitis 2nd toe.  The various methods of treatment have been discussed with the patient.  No guarantees were given.  After consideration of risks, benefits and other options for treatment, the patient has consented to surgery.  I have reviewed the patients' chart and labs.    Patient Vitals for the past 24 hrs:  BP Temp Temp src Pulse Resp SpO2 Weight  12/29/15 1214 121/78 98 F (36.7 C) Tympanic (!) 58 16 96 % -  12/29/15 0920 131/74 98 F (36.7 C) Oral 67 16 96 % -  12/29/15 0848 (!) 148/67 98.6 F (37 C) Oral 79 18 100 % -  12/29/15 0500 - - - - - - 95 kg (209 lb 8 oz)  12/29/15 0359 107/72 98 F (36.7 C) Oral 62 16 97 % -  12/28/15 1939 (!) 147/80 98.2 F (36.8 C) Oral 70 - 97 % -  12/28/15 1420 110/70 97.9 F (36.6 C) Oral 62 18 98 % -    A history and physical examination was performed in house.  The patient was reexamined.  There have been no changes to this history and physical examination.  Gwyneth RevelsFowler, Mathew Storck A

## 2015-12-29 NOTE — Anesthesia Preprocedure Evaluation (Signed)
Anesthesia Evaluation  Patient identified by MRN, date of birth, ID band Patient awake    Reviewed: Allergy & Precautions, H&P , NPO status , Patient's Chart, lab work & pertinent test results, reviewed documented beta blocker date and time   History of Anesthesia Complications Negative for: history of anesthetic complications  Airway Mallampati: III  TM Distance: >3 FB Neck ROM: full    Dental no notable dental hx. (+) Caps   Pulmonary neg shortness of breath, sleep apnea and Continuous Positive Airway Pressure Ventilation , neg COPD, neg recent URI, Current Smoker,    Pulmonary exam normal breath sounds clear to auscultation       Cardiovascular Exercise Tolerance: Good hypertension, (-) angina+ Peripheral Vascular Disease  (-) CAD, (-) Past MI, (-) Cardiac Stents and (-) CABG Normal cardiovascular exam(-) dysrhythmias (-) Valvular Problems/Murmurs Rhythm:regular Rate:Normal     Neuro/Psych neg Seizures  Neuromuscular disease (Myasthenia Gravis) negative psych ROS   GI/Hepatic negative GI ROS, Neg liver ROS,   Endo/Other  diabetes, Oral Hypoglycemic Agents  Renal/GU Renal disease  negative genitourinary   Musculoskeletal   Abdominal   Peds  Hematology negative hematology ROS (+)   Anesthesia Other Findings Past Medical History: No date: Diabetes mellitus without complication (HCC) No date: DJD (degenerative joint disease) No date: Hypertension No date: Myasthenia gravis (HCC) No date: Renal disorder   Reproductive/Obstetrics negative OB ROS                             Anesthesia Physical Anesthesia Plan  ASA: II  Anesthesia Plan: General   Post-op Pain Management:    Induction:   Airway Management Planned:   Additional Equipment:   Intra-op Plan:   Post-operative Plan:   Informed Consent: I have reviewed the patients History and Physical, chart, labs and discussed the  procedure including the risks, benefits and alternatives for the proposed anesthesia with the patient or authorized representative who has indicated his/her understanding and acceptance.   Dental Advisory Given  Plan Discussed with: Anesthesiologist, CRNA and Surgeon  Anesthesia Plan Comments:         Anesthesia Quick Evaluation

## 2015-12-29 NOTE — Progress Notes (Signed)
Patient ID: Kurt PhenesJohn D Shakir, male   DOB: April 15, 1961, 55 y.o.   MRN: [id number]  [facility] PROGRESS NOTE  Kurt PhenesJohn D Glaus WUJ:[id number] DOB: April 15, 1961 DOA: 12/26/2015 PCP: No primary care provider on file.  HPI/Subjective: Patient seen this morning. Was feeling okay. Does have a little pain in that toe. Seen walking around the hallway. Does have a little right eye ptosis.  Objective: Vitals:   12/29/15 1506 12/29/15 1521  BP: 128/77 115/72  Pulse: 78 76  Resp: 15 20  Temp:      Filed Weights   12/27/15 0443 12/28/15 0500 12/29/15 0500  Weight: 93.4 kg (206 lb) 93.9 kg (207 lb 0.2 oz) 95 kg (209 lb 8 oz)    ROS: Review of Systems  Constitutional: Negative for chills and fever.  Eyes: Negative for blurred vision.  Respiratory: Negative for cough and shortness of breath.   Cardiovascular: Negative for chest pain.  Gastrointestinal: Negative for abdominal pain, constipation, diarrhea, nausea and vomiting.  Genitourinary: Negative for dysuria.  Musculoskeletal: Positive for joint pain.  Neurological: Negative for dizziness and headaches.   Exam: Physical Exam  Constitutional: He is oriented to person, place, and time.  HENT:  Nose: No mucosal edema.  Mouth/Throat: No oropharyngeal exudate or posterior oropharyngeal edema.  Eyes: Conjunctivae, EOM and lids are normal. Pupils are equal, round, and reactive to light.  Neck: No JVD present. Carotid bruit is not present. No edema present. No thyroid mass and no thyromegaly present.  Cardiovascular: S1 normal and S2 normal.  Exam reveals no gallop.   No murmur heard. Pulses:      Dorsalis pedis pulses are 2+ on the right side, and 2+ on the left side.  Respiratory: No respiratory distress. He has no wheezes. He has no rhonchi. He has no rales.  GI: Soft. Bowel sounds are normal. There is no tenderness.  Musculoskeletal:       Right ankle: He exhibits no swelling.       Left ankle: He exhibits no swelling.  Lymphadenopathy:    He has no cervical adenopathy.  Neurological: He is alert and oriented to person, place, and time. No cranial nerve deficit.  Skin: Skin is warm. Nails show no clubbing.  Dry skin bilateral feet. Left second toe small little ulcer. Whenever erythema on the toe seems to have settled down. Right foot fifth MT joint some swelling there.  Psychiatric: He has a normal mood and affect.      Data Reviewed: Basic Metabolic Panel:  Recent Labs Lab 12/26/15 1641 12/27/15 0412 12/28/15 0432  NA 134* 135 137  K 3.3* 3.3* 3.7  CL 95* 99* 102  CO2 27 29 30   GLUCOSE 167* 119* 137*  BUN 10 13 12   CREATININE 0.92 0.83 0.89  CALCIUM 8.9 8.6* 8.6*  MG  --   --  1.8   Liver Function Tests:  Recent Labs Lab 12/26/15 1641 12/27/15 0412  AST 29 18  ALT 17 13*  ALKPHOS 79 64  BILITOT 0.7 0.3  PROT 7.7 6.3*  ALBUMIN 4.0 3.1*   CBC:  Recent Labs Lab 12/26/15 1641 12/27/15 0412  WBC 11.0* 6.3  NEUTROABS 7.6*  --   HGB 14.4 12.6*  HCT 41.4 37.5*  MCV 96.1 95.4  PLT 250 232    CBG:  Recent Labs Lab 12/28/15 1648 12/28/15 2130 12/29/15 0737 12/29/15 1159 12/29/15 1522  GLUCAP 113* 164* 107* 102* 84    Recent Results (from the past 240 hour(s))  Blood culture (  routine x 2)     Status: None (Preliminary result)   Collection Time: 12/26/15  5:44 PM  Result Value Ref Range Status   Specimen Description BLOOD LEFT FOREARM  Final   Special Requests BOTTLES DRAWN AEROBIC AND ANAEROBIC 12CC  Final   Culture NO GROWTH 3 DAYS  Final   Report Status PENDING  Incomplete  Blood culture (routine x 2)     Status: None (Preliminary result)   Collection Time: 12/26/15  6:00 PM  Result Value Ref Range Status   Specimen Description BLOOD RIGHT ANTECUBITAL  Final   Special Requests   Final    BOTTLES DRAWN AEROBIC AND ANAEROBIC  AER 12CC ANA 15CC   Culture NO GROWTH 3 DAYS  Final   Report Status PENDING  Incomplete  Aerobic/Anaerobic Culture (surgical/deep wound)     Status: None  (Preliminary result)   Collection Time: 12/28/15  9:13 AM  Result Value Ref Range Status   Specimen Description ULCER  Final   Special Requests Immunocompromised  Final   Gram Stain   Final    FEW WBC PRESENT, PREDOMINANTLY PMN RARE GRAM POSITIVE COCCOBACILLUS    Culture   Final    CULTURE REINCUBATED FOR BETTER GROWTH Performed at [facility]    Report Status PENDING  Incomplete  Surgical pcr screen     Status: Abnormal   Collection Time: 12/28/15 12:00 PM  Result Value Ref Range Status   MRSA, PCR NEGATIVE NEGATIVE Final   Staphylococcus aureus POSITIVE (A) NEGATIVE Final    Comment:        The Xpert SA Assay (FDA approved for NASAL specimens in patients over 67 years of age), is one component of a comprehensive surveillance program.  Test performance has been validated by [facility] for patients greater than or equal to 8 year old. It is not intended to diagnose infection nor to guide or monitor treatment.      Scheduled Meds: . [MAR Hold] aspirin EC  81 mg Oral Daily  . [MAR Hold] Chlorhexidine Gluconate Cloth  6 each Topical Daily  . [MAR Hold] citalopram  20 mg Oral Daily  . [MAR Hold] docusate sodium  100 mg Oral BID  . [MAR Hold] fentaNYL  50 mcg Transdermal Q72H  . [MAR Hold] gabapentin  600 mg Oral TID  . [MAR Hold] heparin  5,000 Units Subcutaneous Q8H  . [MAR Hold] insulin aspart  0-9 Units Subcutaneous TID WC  . [MAR Hold] levofloxacin  500 mg Oral Daily  . [MAR Hold] losartan  50 mg Oral Daily  . [MAR Hold] metoprolol tartrate  25 mg Oral BID  . [MAR Hold] mupirocin ointment  1 application Nasal BID  . [MAR Hold] pantoprazole  40 mg Oral Daily  . [MAR Hold] tolnaftate   Topical BID    Assessment/Plan:  1. Cellulitis with Left second toe ulceration. X-ray commented on possible osteomyelitis but sedimentation rate only 44. Patient For amputation of the left second toe today. 2. Myasthenia gravis. Case discussed with neurologist no  preoperative steroids needed. Mestinon can be given for symptoms if needed 3. Essential hypertension on metoprolol and losartan 4. Chronic pain on fentanyl patch, gabapentin 5. Type 2 diabetes mellitus continue sliding scale 6. Fungal infection feet start Lac-Hydrin lotion and Tinactin.  Code Status:     Code Status Orders        Start     Ordered   12/26/15 1928  Full code  Continuous  12/26/15 1927    Code Status History    Date Active Date Inactive Code Status Order ID Comments User Context   This patient has a current code status but no historical code status.     Disposition Plan: Home Potentially tomorrow evening versus Thursday.  Consultants:  Podiatry  Procedures:  Angiogram  Antibiotics:  Levaquin  Time spent: 25 minutes now  Alford HighlandWIETING, Lathon Adan  [employer]

## 2015-12-29 NOTE — Progress Notes (Signed)
Patient underwent distal left second toe amputation today. He tolerated the procedure well. A large bulky sterile dressing was applied.  All infected bone was removed from the surgical field in toto. Patient is stable from podiatry standpoint to be discharged. Suspect he can be discharged home on by mouth antibiotics.  No dressing changes needed.  Weightbearing as tolerated with the postop shoe. Weightbearing should be kept to a minimum while at home. Patient can follow-up with me in 1 week in the outpatient clinic.

## 2015-12-29 NOTE — Op Note (Signed)
Operative note   Surgeon:Jakaree Pickard Armed forces logistics/support/administrative officerowler    Assistant: None    Preop diagnosis: Osteomyelitis distal left second toe    Postop diagnosis: Same    Procedure: Amputation distal left second toe    EBL: Minimal    Anesthesia:local and general    Hemostasis: None    Specimen: Distal left second toe osteomyelitis    Complications: None    Operative indications:Kurt Horton is an 55 y.o. that presents today for surgical intervention.  The risks/benefits/alternatives/complications have been discussed and consent has been given.    Procedure:  Patient was brought into the OR and placed on the operating table in thesupine position. After anesthesia was obtained theleft lower extremity was prepped and draped in usual sterile fashion.  Attention was directed to the distal left second toe where a fishmouth incision was made at approximately the distal interphalangeal joint. Full-thickness incision was undertaken. The distal phalanx and ulceration was disarticulated and removed from the surgical field in toto. The head of the middle phalanx was noted and intact. The base of proximal phalanx had some fibrotic tissue in the surrounding soft tissue. This time further removal of the distal one half of the middle phalanx was then performed with a power saw. Good residual tissue was noted at this time. All bleeders were Bovie cauterized. The wound was flushed with copious amounts or irrigation. Closure was performed with a 3-0 nylon. A well crest a sterile bulky dressing was applied.    Patient tolerated the procedure and anesthesia well.  Was transported from the OR to the PACU with all vital signs stable and vascular status intact. To be discharged per routine protocol to the floor.  Will follow up in approximately 1 week in the outpatient clinic.

## 2015-12-29 NOTE — Transfer of Care (Signed)
Immediate Anesthesia Transfer of Care Note  Patient: Kurt PhenesJohn D Poehler  Procedure(s) Performed: Procedure(s): AMPUTATION TOE (Left)  Patient Location: PACU  Anesthesia Type:General  Level of Consciousness: awake, alert  and oriented  Airway & Oxygen Therapy: Patient Spontanous Breathing  Post-op Assessment: Report given to RN  Post vital signs: Reviewed and stable  Last Vitals:  Vitals:   12/29/15 1214 12/29/15 1451  BP: 121/78 121/82  Pulse: (!) 58 79  Resp: 16 14  Temp: 36.7 C 36.4 C    Last Pain:  Vitals:   12/29/15 1214  TempSrc: Tympanic  PainSc: 0-No pain      Patients Stated Pain Goal: 1 (12/29/15 0931)  Complications: No apparent anesthesia complications

## 2015-12-30 ENCOUNTER — Encounter: Payer: Self-pay | Admitting: Podiatry

## 2015-12-30 LAB — GLUCOSE, CAPILLARY
Glucose-Capillary: 101 mg/dL — ABNORMAL HIGH (ref 65–99)
Glucose-Capillary: 92 mg/dL (ref 65–99)

## 2015-12-30 MED ORDER — CLONAZEPAM 0.5 MG PO TABS: 0.5000 mg | ORAL_TABLET | Freq: Two times a day (BID) | ORAL | 0 refills | Status: DC | PRN

## 2015-12-30 MED ORDER — GABAPENTIN 300 MG PO CAPS: 600.0000 mg | ORAL_CAPSULE | Freq: Three times a day (TID) | ORAL | 0 refills | Status: DC

## 2015-12-30 MED ORDER — LEVOFLOXACIN 500 MG PO TABS: 500.0000 mg | ORAL_TABLET | Freq: Every day | ORAL | 0 refills | Status: DC

## 2015-12-30 MED ORDER — SUMATRIPTAN SUCCINATE 100 MG PO TABS: 100.0000 mg | ORAL_TABLET | ORAL | 0 refills | Status: DC | PRN

## 2015-12-30 MED ORDER — ZOLPIDEM TARTRATE 5 MG PO TABS: 10.0000 mg | ORAL_TABLET | Freq: Every evening | ORAL | Status: DC | PRN

## 2015-12-30 MED ORDER — LISINOPRIL 10 MG PO TABS: 10.0000 mg | ORAL_TABLET | Freq: Every day | ORAL | 0 refills | Status: DC

## 2015-12-30 MED ORDER — CITALOPRAM HYDROBROMIDE 20 MG PO TABS: 20.0000 mg | ORAL_TABLET | Freq: Every day | ORAL | 0 refills | Status: DC

## 2015-12-30 MED ORDER — ZOLPIDEM TARTRATE 10 MG PO TABS: 10.0000 mg | ORAL_TABLET | Freq: Every evening | ORAL | 0 refills | Status: DC | PRN

## 2015-12-30 MED ORDER — METFORMIN HCL 500 MG PO TABS: 500.0000 mg | ORAL_TABLET | Freq: Every day | ORAL | 0 refills | Status: DC

## 2015-12-30 MED ORDER — OXYCODONE HCL 15 MG PO TABS: 15.0000 mg | ORAL_TABLET | Freq: Two times a day (BID) | ORAL | 0 refills | Status: DC | PRN

## 2015-12-30 NOTE — Anesthesia Postprocedure Evaluation (Signed)
Anesthesia Post Note  Patient: Kurt PhenesJohn D Horton  Procedure(s) Performed: Procedure(s) (LRB): AMPUTATION TOE (Left)  Patient location during evaluation: PACU Anesthesia Type: General Level of consciousness: awake and alert Pain management: pain level controlled Vital Signs Assessment: post-procedure vital signs reviewed and stable Respiratory status: spontaneous breathing, nonlabored ventilation, respiratory function stable and patient connected to nasal cannula oxygen Cardiovascular status: blood pressure returned to baseline and stable Postop Assessment: no signs of nausea or vomiting Anesthetic complications: no    Last Vitals:  Vitals:   12/30/15 0502 12/30/15 0735  BP: 110/71 120/82  Pulse: (!) 59 67  Resp: 18 18  Temp: 36.7 C 36.7 C    Last Pain:  Vitals:   12/30/15 1003  TempSrc:   PainSc: Asleep                 Lenard SimmerAndrew Quirino Kakos

## 2015-12-30 NOTE — Progress Notes (Signed)
DISCHARGE NOTE:  Pt given discharge instruction and prescriptions. Pt verbalized understanding.

## 2015-12-30 NOTE — Care Management Important Message (Signed)
Important Message  Patient Details  Name: Kurt PhenesJohn D Horton MRN: [id number] Date of Birth: Dec 15, 1960   Medicare Important Message Given:  Other (see comment) (Self Payer)    Adonis HugueninBerkhead, Lael Wetherbee L, RN 12/30/2015, 8:47 AM

## 2015-12-30 NOTE — Care Management (Addendum)
Patient applications filled out by patient and sent via fax [facility]. Sp[facility] with Gideon Sinkita at [employer] who will be able to fill patient medications today at discharge.

## 2015-12-30 NOTE — Discharge Summary (Signed)
[facility] - [place] [facility]   PATIENT NAME: Kurt Horton    MR#:  [id number]  DATE OF BIRTH:  1960-08-09  DATE OF ADMISSION:  12/26/2015 ADMITTING PHYSICIAN: Marguarite Arbour, MD  DATE OF DISCHARGE: 12/30/2015 11:30 AM  PRIMARY CARE PHYSICIAN: No primary care provider on file.    ADMISSION DIAGNOSIS:  Acute osteomyelitis of left foot (HCC) [M86.172]  DISCHARGE DIAGNOSIS:  Principal Problem:   Osteomyelitis (HCC) Active Problems:   PVD (peripheral vascular disease) (HCC)   Type II diabetes mellitus with manifestations (HCC)   Myasthenia gravis (HCC)   Pressure ulcer   SECONDARY DIAGNOSIS:   Past Medical History:  Diagnosis Date  . Diabetes mellitus without complication (HCC)   . DJD (degenerative joint disease)   . Hypertension   . Myasthenia gravis (HCC)   . Renal disorder     HOSPITAL COURSE:   1. Osteomyelitis and cellulitis of the left second toe. X-ray comment on possible osteomyelitis but sedimentation rate only 44. Dr. Ether Griffins took to the operating room for an amputation of the left second toe. Patient stable for disposition home with follow-up as outpatient with Dr. Ether Griffins podiatry. Levaquin prescribed for few more days. Small prescription of oxycodone prescribed. 2. Myasthenia gravis. Patient did well postoperatively. Follow-up with his neurologist as outpatient in [place] for consideration of CellCept. 3. Essential hypertension on lisinopril and nadalol 4. Chronic pain on fentanyl patch gabapentin. 5. Type 2 diabetes. Was on sliding scale while here. Resume metformin. 6. Patient has to find a primary care physician in the area. He states he will [facility] with [facility]. 7. Patient also had an angiogram which was negative. Patient had good blood supply going down to the left toe.  DISCHARGE CONDITIONS:   Satisfactory  CONSULTS OBTAINED:  Treatment Team:  Tonette Lederer, PA-C Gwyneth Revels, DPM  DRUG ALLERGIES:    Allergies  Allergen Reactions  . Other Other (See Comments)    Patient states he can't take any "mycin" medications because myasthenia gravis.    DISCHARGE MEDICATIONS:   Discharge Medication List as of 12/30/2015  9:25 AM    START taking these medications   Details  levofloxacin (LEVAQUIN) 500 MG tablet Take 1 tablet (500 mg total) by mouth daily., Starting Thu 12/30/2015, Print    zolpidem (AMBIEN) 10 MG tablet Take 1 tablet (10 mg total) by mouth at bedtime as needed for sleep., Starting Thu 12/30/2015, Print      CONTINUE these medications which have CHANGED   Details  citalopram (CELEXA) 20 MG tablet Take 1 tablet (20 mg total) by mouth daily., Starting Thu 12/30/2015, Print    clonazePAM (KLONOPIN) 0.5 MG tablet Take 1 tablet (0.5 mg total) by mouth 2 (two) times daily as needed for anxiety., Starting Thu 12/30/2015, Print    gabapentin (NEURONTIN) 300 MG capsule Take 2 capsules (600 mg total) by mouth 3 (three) times daily., Starting Thu 12/30/2015, Normal    lisinopril (PRINIVIL,ZESTRIL) 10 MG tablet Take 1 tablet (10 mg total) by mouth daily., Starting Thu 12/30/2015, Print    metFORMIN (GLUCOPHAGE) 500 MG tablet Take 1 tablet (500 mg total) by mouth daily., Starting Thu 12/30/2015, Print    oxyCODONE (ROXICODONE) 15 MG immediate release tablet Take 1 tablet (15 mg total) by mouth every 12 (twelve) hours as needed for pain., Starting Thu 12/30/2015, Print    SUMAtriptan (IMITREX) 100 MG tablet Take 1 tablet (100 mg total) by mouth every 2 (two) hours as needed for  migraine. May repeat in 2 hours if headache persists or recurs., Starting Thu 12/30/2015, Print      CONTINUE these medications which have NOT CHANGED   Details  b complex vitamins tablet Take 1 tablet by mouth daily., Historical Med    fentaNYL (DURAGESIC - DOSED MCG/HR) 50 MCG/HR Place 50 mcg onto the skin every 3 (three) days., Historical Med    furosemide (LASIX) 40 MG tablet Take 40 mg by mouth daily as  needed for fluid (if taking prednisone). , Historical Med    lidocaine-prilocaine (EMLA) cream Apply 1 application topically as needed. Apply to feet twice daily., Historical Med      STOP taking these medications     diclofenac (FLECTOR) 1.3 % PTCH      mycophenolate (CELLCEPT) 500 MG tablet      predniSONE (DELTASONE) 20 MG tablet      traZODone (DESYREL) 100 MG tablet          DISCHARGE INSTRUCTIONS:   Follow-up Dr. Ether GriffinsFowler one week. Follow-up with your neurologist as scheduled. Set up care with primary care physician in the area.  If you experience worsening of your admission symptoms, develop shortness of breath, life threatening emergency, suicidal or homicidal thoughts you must seek medical attention immediately by calling 911 or calling your MD immediately  if symptoms less severe.  You Must read complete instructions/literature along with all the possible adverse reactions/side effects for all the Medicines you take and that have been prescribed to you. Take any new Medicines after you have completely understood and accept all the possible adverse reactions/side effects.   Please note  You were cared for by a hospitalist during your hospital stay. If you have any questions about your discharge medications or the care you received while you were in the hospital after you are discharged, you can call the unit and asked to speak with the hospitalist on call if the hospitalist that took care of you is not available. Once you are discharged, your primary care physician will handle any further medical issues. Please note that NO REFILLS for any discharge medications will be authorized once you are discharged, as it is imperative that you return to your primary care physician (or establish a relationship with a primary care physician if you do not have one) for your aftercare needs so that they can reassess your need for medications and monitor your lab values.    Today   CHIEF  COMPLAINT:   Chief Complaint  Patient presents with  . Foot Swelling    HISTORY OF PRESENT ILLNESS:  [name]  is a 55 y.o. male with a known history of Myasthenia gravis presents with left toe pain.   VITAL SIGNS:  Blood pressure 120/82, pulse 67, temperature 98 F (36.7 C), temperature source Oral, resp. rate 18, height 5\' 10"  (1.778 m), weight 94.5 kg (208 lb 4.8 oz), SpO2 95 %.    PHYSICAL EXAMINATION:  GENERAL:  55 y.o.-year-old patient lying in the bed with no acute distress.  EYES: Pupils equal, round, reactive to light and accommodation. No scleral icterus. Extraocular muscles intact.  HEENT: Head atraumatic, normocephalic. Oropharynx and nasopharynx clear.  NECK:  Supple, no jugular venous distention. No thyroid enlargement, no tenderness.  LUNGS: Normal breath sounds bilaterally, no wheezing, rales,rhonchi or crepitation. No use of accessory muscles of respiration.  CARDIOVASCULAR: S1, S2 normal. No murmurs, rubs, or gallops.  ABDOMEN: Soft, non-tender, non-distended. Bowel sounds present. No organomegaly or mass.  EXTREMITIES: No pedal  edema, cyanosis, or clubbing.  NEUROLOGIC: Cranial nerves II through XII are intact. Muscle strength 5/5 in all extremities. Sensation intact. Gait not checked.  PSYCHIATRIC: The patient is alert and oriented x 3.  SKIN: Left foot and pressure bandage.  DATA REVIEW:   CBC  Recent Labs Lab 12/27/15 0412  WBC 6.3  HGB 12.6*  HCT 37.5*  PLT 232    Chemistries   Recent Labs Lab 12/27/15 0412 12/28/15 0432  NA 135 137  K 3.3* 3.7  CL 99* 102  CO2 29 30  GLUCOSE 119* 137*  BUN 13 12  CREATININE 0.83 0.89  CALCIUM 8.6* 8.6*  MG  --  1.8  AST 18  --   ALT 13*  --   ALKPHOS 64  --   BILITOT 0.3  --      Microbiology Results  Results for orders placed or performed during the hospital encounter of 12/26/15  Blood culture (routine x 2)     Status: None (Preliminary result)   Collection Time: 12/26/15  5:44 PM   Result Value Ref Range Status   Specimen Description BLOOD LEFT FOREARM  Final   Special Requests BOTTLES DRAWN AEROBIC AND ANAEROBIC 12CC  Final   Culture NO GROWTH 4 DAYS  Final   Report Status PENDING  Incomplete  Blood culture (routine x 2)     Status: None (Preliminary result)   Collection Time: 12/26/15  6:00 PM  Result Value Ref Range Status   Specimen Description BLOOD RIGHT ANTECUBITAL  Final   Special Requests   Final    BOTTLES DRAWN AEROBIC AND ANAEROBIC  AER 12CC ANA 15CC   Culture NO GROWTH 4 DAYS  Final   Report Status PENDING  Incomplete  Aerobic/Anaerobic Culture (surgical/deep wound)     Status: None (Preliminary result)   Collection Time: 12/28/15  9:13 AM  Result Value Ref Range Status   Specimen Description ULCER  Final   Special Requests Immunocompromised  Final   Gram Stain   Final    FEW WBC PRESENT, PREDOMINANTLY PMN RARE GRAM POSITIVE COCCOBACILLUS Performed at [facility]    Culture   Final    FEW STAPHYLOCOCCUS AUREUS FEW GROUP B STREP(S.AGALACTIAE)ISOLATED    Report Status PENDING  Incomplete  Surgical pcr screen     Status: Abnormal   Collection Time: 12/28/15 12:00 PM  Result Value Ref Range Status   MRSA, PCR NEGATIVE NEGATIVE Final   Staphylococcus aureus POSITIVE (A) NEGATIVE Final    Comment:        The Xpert SA Assay (FDA approved for NASAL specimens in patients over 24 years of age), is one component of a comprehensive surveillance program.  Test performance has been validated by [facility] for patients greater than or equal to 77 year old. It is not intended to diagnose infection nor to guide or monitor treatment.   Aerobic/Anaerobic Culture (surgical/deep wound)     Status: None (Preliminary result)   Collection Time: 12/29/15  2:31 PM  Result Value Ref Range Status   Specimen Description WOUND LEFT FOOT  Final   Special Requests NONE  Final   Gram Stain   Final    RARE WBC PRESENT, PREDOMINANTLY PMN NO ORGANISMS  SEEN    Culture   Final    NO GROWTH < 24 HOURS Performed at [facility]    Report Status PENDING  Incomplete      Management plans discussed with the patient,And he is in agreement.  CODE STATUS:     Code Status Orders        Start     Ordered   12/26/15 1928  Full code  Continuous     12/26/15 1927    Code Status History    Date Active Date Inactive Code Status Order ID Comments User Context   This patient has a current code status but no historical code status.      TOTAL TIME TAKING CARE OF THIS PATIENT: 35 minutes.    Alford HighlandWIETING, Sidnee Gambrill M.D on 12/30/2015 at 2:58 PM  Between 7am to 6pm - Pager - [phone]  After 6pm go to [email or web address] - password [email or web address]  [facility] Office  [phone]  CC: Primary care physician; No primary care provider on file.

## 2015-12-31 LAB — CULTURE, BLOOD (ROUTINE X 2)
CULTURE: NO GROWTH
Culture: NO GROWTH

## 2015-12-31 LAB — SURGICAL PATHOLOGY

## 2016-01-02 LAB — AEROBIC/ANAEROBIC CULTURE W GRAM STAIN (SURGICAL/DEEP WOUND)

## 2016-01-03 LAB — AEROBIC/ANAEROBIC CULTURE W GRAM STAIN (SURGICAL/DEEP WOUND): Culture: NO GROWTH

## 2016-01-04 ENCOUNTER — Encounter: Payer: Self-pay | Admitting: *Deleted

## 2016-01-04 ENCOUNTER — Ambulatory Visit (INDEPENDENT_AMBULATORY_CARE_PROVIDER_SITE_OTHER): Payer: Medicaid Other | Admitting: Neurology

## 2016-01-04 VITALS — BP 120/80 | HR 76 | Resp 18 | Ht 70.0 in | Wt 217.0 lb

## 2016-01-04 DIAGNOSIS — M545 Low back pain: Secondary | ICD-10-CM

## 2016-01-04 DIAGNOSIS — G7000 Myasthenia gravis without (acute) exacerbation: Secondary | ICD-10-CM | POA: Diagnosis not present

## 2016-01-04 DIAGNOSIS — G8929 Other chronic pain: Secondary | ICD-10-CM | POA: Diagnosis not present

## 2016-01-04 DIAGNOSIS — E118 Type 2 diabetes mellitus with unspecified complications: Secondary | ICD-10-CM

## 2016-01-04 MED ORDER — MYCOPHENOLATE MOFETIL 500 MG PO TABS: ORAL_TABLET | ORAL | 5 refills | Status: DC

## 2016-01-04 MED ORDER — FENTANYL 50 MCG/HR TD PT72: 50.0000 ug | MEDICATED_PATCH | TRANSDERMAL | 0 refills | Status: DC

## 2016-01-04 MED ORDER — OXYCODONE HCL 5 MG PO TABS: 15.0000 mg | ORAL_TABLET | Freq: Three times a day (TID) | ORAL | 0 refills | Status: DC | PRN

## 2016-01-04 NOTE — Progress Notes (Signed)
[facility]  PATIENT: Kurt Horton DOB: 01/19/1961  REFERRING DOCTOR OR PCP:  Referred by Dr. Guilford Shi [phone]);  to see [facility] for PCP SOURCE: patient, records from Drs Ermalene Postin and Manson Passey form [place].     _________________________________   HISTORICAL  CHIEF COMPLAINT:  Chief Complaint  Patient presents with  . Myasthenia Gravis    [name] is here for eval of Myasthenia Gravis.  He has seen Dr. Epimenio Foot in the past, at [facility], for same.  He moved to [place] 6-7 yrs. ago and has just returned to this area.  Sts. he has been  out of Cellcept  and Prednisone for a couple of months.  Sts. he also has chronic back pain for which he takes Oxycodone 15mg , 40 per month, and Fentanyl patches.  Sts. his pain mx. physician decreased him Fentanyl due to lack of follow up.  Denies hx. of back surgeries. Sts. he had an MRI of his back   . Back Pain    at [facility] in [place], [place] about 5 wks ago./fim    HISTORY OF PRESENT ILLNESS:  I had the pleasure of seeing your patient, Kurt Horton, for a neurologic consultation regarding his myasthenia gravis.    I last saw him 6-7 years ago when he moved to [place] and he has returned to this area.    I diagnosed him with myasthenia gravis about 10 years ago after he presented with diplopia and right > left ptosis.   Labwork was consistent with myasthenia gravis and he was initially placed on mestinon but had problems with GI symptoms and stopped.   He was on prednisone and Cellcept x many years.    Currently, off medications, he has mild right ptosis and intermittent diplopia.  Diplopia is more likely to occur if he has a drink.   His myasthenia was worse many years ago and he had constant diplopia, greatly helped by prednisone and Cellcept.    Low back pain:    He has lower back pain and bilateral leg pain.   He reports having a herniated disc.   He was seeing Dr. Ermalene Postin in [place].   He has not had any  surgery.    He has had epidural injections but never had a long term benefit.   He is currently on Duragesic 50 mcg patches with oxycodone 15 mg.  He reports being on Duragesic 75 mcg previously and that Dr. Ermalene Postin just put him on the lower dose recently.   Records from Dr. Ermalene Postin do show this.   He reports the higher dose worked better for him.   He has been on gabapentin but he feels the benefit is mild.     Diabetic Polyneuropathy;   He was diagnosed with diabetes 4 years ago and was noted to have foot numbness shortly thereafter and was diagnosed with polyneuropathy.    He had a diabetic ulcer on his middle toe and just had surgery.     He is on Metformin  REVIEW OF SYSTEMS: Constitutional: No fevers, chills, sweats, or change in appetite Eyes: No visual changes, double vision, eye pain Ear, nose and throat: No hearing loss, ear pain, nasal congestion, sore throat Cardiovascular: No chest pain, palpitations Respiratory: No shortness of breath at rest or with exertion.   No wheezes GastrointestinaI: No nausea, vomiting, diarrhea, abdominal pain, fecal incontinence Genitourinary: No dysuria, urinary retention or frequency.  No nocturia. Musculoskeletal: as above Integumentary: No rash, pruritus,  skin lesions Neurological: as above Psychiatric: No depression at this time.  No anxiety Endocrine: NIDDM Hematologic/Lymphatic: No anemia, purpura, petechiae. Allergic/Immunologic: No itchy/runny eyes, nasal congestion, recent allergic reactions, rashes  ALLERGIES: Allergies  Allergen Reactions  . Other Other (See Comments)    Patient states he can't take any "mycin" medications because myasthenia gravis.    HOME MEDICATIONS:  Current Outpatient Prescriptions:  .  b complex vitamins tablet, Take 1 tablet by mouth daily., Disp: , Rfl:  .  citalopram (CELEXA) 20 MG tablet, Take 1 tablet (20 mg total) by mouth daily., Disp: 30 tablet, Rfl: 0 .  clonazePAM (KLONOPIN) 0.5 MG tablet, Take 1 tablet  (0.5 mg total) by mouth 2 (two) times daily as needed for anxiety., Disp: 30 tablet, Rfl: 0 .  fentaNYL (DURAGESIC - DOSED MCG/HR) 50 MCG/HR, Place 50 mcg onto the skin every 3 (three) days., Disp: , Rfl:  .  furosemide (LASIX) 40 MG tablet, Take 40 mg by mouth daily as needed for fluid (if taking prednisone). , Disp: , Rfl:  .  gabapentin (NEURONTIN) 300 MG capsule, Take 2 capsules (600 mg total) by mouth 3 (three) times daily., Disp: 180 capsule, Rfl: 0 .  levofloxacin (LEVAQUIN) 500 MG tablet, Take 1 tablet (500 mg total) by mouth daily., Disp: 5 tablet, Rfl: 0 .  lidocaine-prilocaine (EMLA) cream, Apply 1 application topically as needed. Apply to feet twice daily., Disp: , Rfl:  .  lisinopril (PRINIVIL,ZESTRIL) 10 MG tablet, Take 1 tablet (10 mg total) by mouth daily., Disp: 30 tablet, Rfl: 0 .  metFORMIN (GLUCOPHAGE) 500 MG tablet, Take 1 tablet (500 mg total) by mouth daily., Disp: 30 tablet, Rfl: 0 .  oxyCODONE (ROXICODONE) 15 MG immediate release tablet, Take 1 tablet (15 mg total) by mouth every 12 (twelve) hours as needed for pain., Disp: 10 tablet, Rfl: 0 .  SUMAtriptan (IMITREX) 100 MG tablet, Take 1 tablet (100 mg total) by mouth every 2 (two) hours as needed for migraine. May repeat in 2 hours if headache persists or recurs., Disp: 6 tablet, Rfl: 0 .  zolpidem (AMBIEN) 10 MG tablet, Take 1 tablet (10 mg total) by mouth at bedtime as needed for sleep., Disp: 15 tablet, Rfl: 0  PAST MEDICAL HISTORY: Past Medical History:  Diagnosis Date  . Diabetes mellitus without complication (HCC)   . DJD (degenerative joint disease)   . Hypertension   . Myasthenia gravis (HCC)   . Renal disorder     PAST SURGICAL HISTORY: Past Surgical History:  Procedure Laterality Date  . AMPUTATION TOE Left 12/29/2015   Procedure: AMPUTATION TOE;  Surgeon: Gwyneth Revels, DPM;  Location: [facility] ORS;  Service: Podiatry;  Laterality: Left;  . PERIPHERAL VASCULAR CATHETERIZATION N/A 12/27/2015   Procedure:  Lower Extremity Angiography;  Surgeon: Annice Needy, MD;  Location: [facility] INVASIVE CV LAB;  Service: Cardiovascular;  Laterality: N/A;    FAMILY HISTORY: Family History  Problem Relation Age of Onset  . Rheum arthritis Father     SOCIAL HISTORY:  Social History   Social History  . Marital status: Legally Separated    Spouse name: N/A  . Number of children: N/A  . Years of education: N/A   Occupational History  . Not on file.   Social History Main Topics  . Smoking status: Never Smoker  . Smokeless tobacco: Never Used  . Alcohol use Not on file  . Drug use: Unknown  . Sexual activity: Not on file   Other Topics Concern  .  Not on file   Social History Narrative  . No narrative on file     PHYSICAL EXAM  Vitals:   01/04/16 1335  BP: 120/80  Pulse: 76  Resp: 18  Weight: 217 lb (98.4 kg)  Height: 5\' 10"  (1.778 m)    Body mass index is 31.14 kg/m.   General: The patient is well-developed and well-nourished and in no acute distress  Eyes:  Funduscopic exam shows normal optic discs and retinal vessels.  Neck: The neck is supple, no carotid bruits are noted.  The neck is nontender.  Cardiovascular: The heart has a regular rate and rhythm with a normal S1 and S2. There were no murmurs, gallops or rubs. Lungs are clear to auscultation.  Skin/Ext: Extremities are without significant edema.  No rash.   He has a bandage on the left foot where he had the third toe amputation.  Musculoskeletal:  Back is nontender  Neurologic Exam  Mental status: The patient is alert and oriented x 3 at the time of the examination. The patient has apparent normal recent and remote memory, with an apparently normal attention span and concentration ability.   Speech is normal.  Cranial nerves: Extraocular movements are full.  However, he has right ptosis. Pupils are equal, round, and reactive to light and accomodation.  Visual fields are full.  Facial symmetry is present. There is good  facial sensation to soft touch bilaterally.Facial strength is normal.  Trapezius and sternocleidomastoid strength is normal. No dysarthria is noted.  The tongue is midline, and the patient has symmetric elevation of the soft palate. No obvious hearing deficits are noted.  Motor:  Muscle bulk is normal.   Tone is normal. Strength is  5 / 5 in all 4 extremities.   Sensory: Sensory testing is intact to pinprick, soft touch and vibration sensation in arms.   He reports reduced touch and vibration sensation in his toes. Sensation was normal at the ankles and above..  Coordination: Cerebellar testing reveals good finger-nose-finger and heel-to-shin bilaterally.  Gait and station: Station is normal.   Gait is normal. Tandem gait is normal. Romberg is negative.   Reflexes: Deep tendon reflexes are symmetric and normal bilaterally.   Plantar responses are flexor.    DIAGNOSTIC DATA (LABS, IMAGING, TESTING) - I reviewed patient records, labs, notes, testing and imaging myself where available.  Lab Results  Component Value Date   WBC 6.3 12/27/2015   HGB 12.6 (L) 12/27/2015   HCT 37.5 (L) 12/27/2015   MCV 95.4 12/27/2015   PLT 232 12/27/2015      Component Value Date/Time   NA 137 12/28/2015 0432   K 3.7 12/28/2015 0432   CL 102 12/28/2015 0432   CO2 30 12/28/2015 0432   GLUCOSE 137 (H) 12/28/2015 0432   BUN 12 12/28/2015 0432   CREATININE 0.89 12/28/2015 0432   CALCIUM 8.6 (L) 12/28/2015 0432   PROT 6.3 (L) 12/27/2015 0412   ALBUMIN 3.1 (L) 12/27/2015 0412   AST 18 12/27/2015 0412   ALT 13 (L) 12/27/2015 0412   ALKPHOS 64 12/27/2015 0412   BILITOT 0.3 12/27/2015 0412   GFRNONAA >60 12/28/2015 0432   GFRAA >60 12/28/2015 0432   No results found for: CHOL, HDL, LDLCALC, LDLDIRECT, TRIG, CHOLHDL Lab Results  Component Value Date   HGBA1C 6.1 (H) 12/27/2015       ASSESSMENT AND PLAN  Myasthenia gravis (HCC) - Plan: Myasthenia Gravis Full Panel  Chronic low back pain - Plan:  Ambulatory  referral to Pain Clinic  Type 2 diabetes mellitus with complication, without long-term current use of insulin (HCC)   In summary, Mr. Phillippi is a 55 year old man who has a history of ocular myasthenia gravis that responded well in the past to CellCept plus steroids. He also has diabetic polyneuropathy.   Because of his diabetes mellitus and wound infection that required toe amputation, to restart prednisone., I will start CellCept by itself. He understands that a benefit might take many months to kick in.   If he has any further infectious issues, we may need to discontinue this.     He had difficulty tolerating Mestinon in the past.    A second issue is his chronic pain. Office does not do chronic pain management and he will be referred to a pain management Center.    I reviewed the [place] controlled substance database and he did get fentanyl and oxycodone filled in August with a prescription written by his pain management doctor in [place].   I will write him for fentanyl 50 g and oxycodone 5 mg 3 times a day.   Dr. Ermalene Postin [facility]) had him on this dose of fentanyl with a higher dose of oxycodone (15 mg twice a day) but I would prefer him to be on the lower dose.   At some point in the past, he was on 75 g fentanyl.      He will return to see me in 4 months or sooner if there are new or worsening symptoms.    We will check labwork at that time.  Thank you for asking me to see Mr. Gallicchio. Please let me know if I can be of further assistance with him or other patients in the future.   Lataya Varnell A. Epimenio Foot, MD, PhD 01/04/2016, 1:39 PM Certified in Neurology, Clinical Neurophysiology, Sleep Medicine, Pain Medicine and Neuroimaging  [facility] [address], [address] [place], [place] [address] [phone]

## 2016-01-10 LAB — MYASTHENIA GRAVIS FULL PANEL
ACETYLCHOL BLOCK AB: 18 % (ref 0–25)
ANTI-STRIATION ABS: NEGATIVE
Acetylcholine Modulat Ab: <12 % (ref 0–20)

## 2016-01-11 ENCOUNTER — Telehealth: Payer: Self-pay | Admitting: *Deleted

## 2016-01-11 NOTE — Telephone Encounter (Signed)
LMOM (identified vm) that per RAS, MG labs were ok, and that some people with occular Myasthenia will have normal labs.  Can discuss in greater detail at f/u ov/fim

## 2016-01-11 NOTE — Telephone Encounter (Signed)
-----   Message from Asa Lenteichard A Sater, MD sent at 01/10/2016  5:54 PM EDT ----- Please let him know that Myasthenia blood work looked good. Many people who have ocular myasthenia will have normal labs.

## 2016-01-13 ENCOUNTER — Ambulatory Visit: Payer: Self-pay

## 2016-01-17 ENCOUNTER — Telehealth: Payer: Self-pay | Admitting: Neurology

## 2016-01-17 NOTE — Telephone Encounter (Signed)
LMOM that RAS would rec. any pcp w/i [facility].  He may call back if he has further questions/fim

## 2016-01-17 NOTE — Telephone Encounter (Signed)
Patient called, states he moved back to [place] from [place], would like for Dr. Epimenio FootSater to refer or make recommendation for PCP. Please call to advise.

## 2016-02-10 ENCOUNTER — Telehealth: Payer: Self-pay | Admitting: Neurology

## 2016-02-10 NOTE — Telephone Encounter (Signed)
Pt called in stating the pain clinic told him it would be 4-6 weeks before they made a decision about his referral. He is out of pain medication and would like a refill on oxyCODONE (OXY IR/ROXICODONE) 5 MG immediate release tablet , fentaNYL (DURAGESIC - DOSED MCG/HR) 50 MCG/HR. Please call

## 2016-02-10 NOTE — Telephone Encounter (Signed)
I have spoken with Kurt Horton this afternoon and explained that Kurt Horton is not able to continue providing r/f of Fentanyl and Oxycodone.  He has been referred to pain mx.   drug registry shows he has received some r/f of meds from a Dr. Rene Kocherobert Wieting.  Kurt Horton verbalized understanding of same/fim

## 2016-02-22 ENCOUNTER — Telehealth: Payer: Self-pay | Admitting: Neurology

## 2016-02-22 NOTE — Telephone Encounter (Signed)
Called patient and relayed that he needs to come sign a release for medical records from [facility] . [facility] . Telephone [phone] - Fax . [phone]. Thanks Annabelle Harmanana.

## 2016-03-01 NOTE — Telephone Encounter (Addendum)
I had not hurd back from patient called him again about signing a release for  [facility] needs [facility] records before they can get him scheduled. Patient's voice mail is full.    Patient called me back he has signed a release for [facility] .

## 2016-06-14 NOTE — Telephone Encounter (Signed)
error 

## 2016-06-20 ENCOUNTER — Telehealth: Payer: Self-pay | Admitting: Neurology

## 2016-06-20 NOTE — Telephone Encounter (Signed)
Called and left message for Patient to call me back . (Pain mgt. Referral).

## 2016-06-21 NOTE — Telephone Encounter (Signed)
 Physical medicine and Rehab (Pain MGT). Has declined to  see Patient. Called and relayed to patient details. Patient was ok with decision because he has no insurance at this time. Patient will call the office back when he is approved for his insurance.

## 2016-06-21 NOTE — Telephone Encounter (Signed)
Noted/fim 

## 2016-07-06 ENCOUNTER — Telehealth: Payer: Self-pay | Admitting: Neurology

## 2016-07-06 NOTE — Telephone Encounter (Signed)
Mailed release form for dana to patient

## 2016-08-09 ENCOUNTER — Telehealth: Payer: Self-pay | Admitting: Physical Medicine & Rehabilitation

## 2016-08-09 NOTE — Telephone Encounter (Signed)
Patient wants to know why denied access told him no pain /narcotic mngt we seek alternative treatments offered [facility] as an alternative.

## 2016-08-09 NOTE — Telephone Encounter (Addendum)
Pt called said he has medicaid now. The [facility] pain management said they don't do the pain regiment he has been used to. He was advised to get a referral to [facility] or if there is an another facility Dr Kathie Rhodes would prefer.  Please call him at [phone]. Pt scheduled a f/u appt for 4/19

## 2016-08-09 NOTE — Telephone Encounter (Signed)
Can address this at f/u appt/fim

## 2016-08-24 ENCOUNTER — Ambulatory Visit (INDEPENDENT_AMBULATORY_CARE_PROVIDER_SITE_OTHER): Payer: Medicaid Other | Admitting: Neurology

## 2016-08-24 ENCOUNTER — Encounter: Payer: Self-pay | Admitting: Neurology

## 2016-08-24 ENCOUNTER — Encounter (INDEPENDENT_AMBULATORY_CARE_PROVIDER_SITE_OTHER): Payer: Self-pay

## 2016-08-24 VITALS — BP 180/102 | HR 92 | Resp 18 | Ht 70.0 in | Wt 217.0 lb

## 2016-08-24 DIAGNOSIS — G8929 Other chronic pain: Secondary | ICD-10-CM

## 2016-08-24 DIAGNOSIS — M545 Low back pain: Secondary | ICD-10-CM | POA: Diagnosis not present

## 2016-08-24 DIAGNOSIS — F418 Other specified anxiety disorders: Secondary | ICD-10-CM

## 2016-08-24 DIAGNOSIS — G7000 Myasthenia gravis without (acute) exacerbation: Secondary | ICD-10-CM

## 2016-08-24 DIAGNOSIS — F332 Major depressive disorder, recurrent severe without psychotic features: Secondary | ICD-10-CM | POA: Insufficient documentation

## 2016-08-24 DIAGNOSIS — M5416 Radiculopathy, lumbar region: Secondary | ICD-10-CM | POA: Diagnosis not present

## 2016-08-24 MED ORDER — OXYCODONE HCL 5 MG PO TABS: 5.0000 mg | ORAL_TABLET | Freq: Three times a day (TID) | ORAL | 0 refills | Status: DC | PRN

## 2016-08-24 MED ORDER — CITALOPRAM HYDROBROMIDE 20 MG PO TABS: 20.0000 mg | ORAL_TABLET | Freq: Every day | ORAL | 5 refills | Status: DC

## 2016-08-24 MED ORDER — GABAPENTIN 300 MG PO CAPS: 600.0000 mg | ORAL_CAPSULE | Freq: Three times a day (TID) | ORAL | 5 refills | Status: DC

## 2016-08-24 NOTE — Progress Notes (Signed)
[facility]  PATIENT: Kurt Horton DOB: 1961-03-16  REFERRING DOCTOR OR PCP:  Referred by Dr. Guilford Shi [phone]);  to see [facility] for PCP SOURCE: patient, records from Drs Ermalene Postin and Manson Passey form [place].     _________________________________   HISTORICAL  CHIEF COMPLAINT:  Chief Complaint  Patient presents with  . Myasthenia Gravis    Sts. has had increased double vision, right sided ptosis over the last 2 weeks.  Sts. up until one month ago, he was not compliant with Cellcept.  Sts. back pain is worse and he would like another referal to Pain Management .  Would also like Clonazepam for anxiety/fim  . Back Pain  . Diabetic Polyneuropathy    HISTORY OF PRESENT ILLNESS:  Kurt Horton is a 56 yo man with myasthenia gravis and chronic lower back pain.     MG:   He was diagnosed with MG around 2007 after he presented with diplopia and right > left ptosis.   Labwork was consistent with myasthenia gravis and he was initially placed on mestinon but had problems with GI symptoms and stopped.   He was on prednisone and Cellcept x many years.  Due to not having insurance for a while, he stopped his Cellcept.     Currently, he notes mild right ptosis and intermittent diplopia.    His myasthenia was worse his first year of MG and he had constant diplopia, greatly helped by prednisone and Cellcept.    Low back pain:    He has lower back pain and bilateral leg pain.  Worse pain is in the axial lower back but he sometimes gets pain going down one of his legs.   He reports having a herniated disc.   He was seeing Dr. Ermalene Postin in [place].   He has not had any surgery.   In the past, he had epidural injections but benefit was always short acting.  In the past, he was on Fentanyl and oxycodone but has had no pain med's the past 6 months.    He repots pain is much worse, especially with walking.     Records from Dr. Ermalene Postin [facility]) showed the fentanyl (50 mcg and oxycodone 15 mg  po bid).      Diabetic Polyneuropathy;   He was diagnosed with diabetes around 2013.   He started having some foot pain a year later with numbnss and was diagnosed with polyneuropathy.    He had a diabetic ulcer on his middle toe and just had surgery.     He is on Metformin  Anxiety:    He also notes a lot of anxiety, worse the past few months  REVIEW OF SYSTEMS: Constitutional: No fevers, chills, sweats, or change in appetite Eyes: No visual changes, double vision, eye pain Ear, nose and throat: No hearing loss, ear pain, nasal congestion, sore throat Cardiovascular: No chest pain, palpitations Respiratory: No shortness of breath at rest or with exertion.   No wheezes GastrointestinaI: No nausea, vomiting, diarrhea, abdominal pain, fecal incontinence Genitourinary: No dysuria, urinary retention or frequency.  No nocturia. Musculoskeletal: as above Integumentary: No rash, pruritus, skin lesions Neurological: as above Psychiatric: No depression at this time.  No anxiety Endocrine: NIDDM Hematologic/Lymphatic: No anemia, purpura, petechiae. Allergic/Immunologic: No itchy/runny eyes, nasal congestion, recent allergic reactions, rashes  ALLERGIES: Allergies  Allergen Reactions  . Other Other (See Comments)    Patient states he can't take any "mycin" medications because myasthenia gravis.    HOME MEDICATIONS:  Current Outpatient Prescriptions:  .  b complex vitamins tablet, Take 1 tablet by mouth daily., Disp: , Rfl:  .  furosemide (LASIX) 40 MG tablet, Take 40 mg by mouth daily as needed for fluid (if taking prednisone). , Disp: , Rfl:  .  gabapentin (NEURONTIN) 300 MG capsule, Take 2 capsules (600 mg total) by mouth 3 (three) times daily., Disp: 180 capsule, Rfl: 5 .  lidocaine-prilocaine (EMLA) cream, Apply 1 application topically as needed. Apply to feet twice daily., Disp: , Rfl:  .  lisinopril (PRINIVIL,ZESTRIL) 10 MG tablet, Take 1 tablet (10 mg total) by mouth daily., Disp:  30 tablet, Rfl: 0 .  metFORMIN (GLUCOPHAGE) 500 MG tablet, Take 1 tablet (500 mg total) by mouth daily., Disp: 30 tablet, Rfl: 0 .  mycophenolate (CELLCEPT) 500 MG tablet, Take one pill in the morning and two pills at night., Disp: 90 tablet, Rfl: 5 .  zolpidem (AMBIEN) 10 MG tablet, Take 1 tablet (10 mg total) by mouth at bedtime as needed for sleep., Disp: 15 tablet, Rfl: 0 .  citalopram (CELEXA) 20 MG tablet, Take 1 tablet (20 mg total) by mouth daily., Disp: 30 tablet, Rfl: 5 .  clonazePAM (KLONOPIN) 0.5 MG tablet, Take 1 tablet (0.5 mg total) by mouth 2 (two) times daily as needed for anxiety. (Patient not taking: Reported on 08/24/2016), Disp: 30 tablet, Rfl: 0 .  fentaNYL (DURAGESIC - DOSED MCG/HR) 50 MCG/HR, Place 1 patch (50 mcg total) onto the skin every 3 (three) days. (Patient not taking: Reported on 08/24/2016), Disp: 10 patch, Rfl: 0 .  levofloxacin (LEVAQUIN) 500 MG tablet, Take 1 tablet (500 mg total) by mouth daily. (Patient not taking: Reported on 08/24/2016), Disp: 5 tablet, Rfl: 0 .  oxyCODONE (OXY IR/ROXICODONE) 5 MG immediate release tablet, Take 1 tablet (5 mg total) by mouth every 8 (eight) hours as needed., Disp: 90 tablet, Rfl: 0 .  SUMAtriptan (IMITREX) 100 MG tablet, Take 1 tablet (100 mg total) by mouth every 2 (two) hours as needed for migraine. May repeat in 2 hours if headache persists or recurs. (Patient not taking: Reported on 08/24/2016), Disp: 6 tablet, Rfl: 0  PAST MEDICAL HISTORY: Past Medical History:  Diagnosis Date  . Diabetes mellitus without complication (HCC)   . DJD (degenerative joint disease)   . Hypertension   . Myasthenia gravis (HCC)   . Renal disorder     PAST SURGICAL HISTORY: Past Surgical History:  Procedure Laterality Date  . AMPUTATION TOE Left 12/29/2015   Procedure: AMPUTATION TOE;  Surgeon: Gwyneth Revels, DPM;  Location: [facility] ORS;  Service: Podiatry;  Laterality: Left;  . PERIPHERAL VASCULAR CATHETERIZATION N/A 12/27/2015   Procedure:  Lower Extremity Angiography;  Surgeon: Annice Needy, MD;  Location: [facility] INVASIVE CV LAB;  Service: Cardiovascular;  Laterality: N/A;    FAMILY HISTORY: Family History  Problem Relation Age of Onset  . Rheum arthritis Father     SOCIAL HISTORY:  Social History   Social History  . Marital status: Legally Separated    Spouse name: N/A  . Number of children: N/A  . Years of education: N/A   Occupational History  . Not on file.   Social History Main Topics  . Smoking status: Never Smoker  . Smokeless tobacco: Never Used  . Alcohol use Not on file  . Drug use: Unknown  . Sexual activity: Not on file   Other Topics Concern  . Not on file   Social History Narrative  . No narrative  on file     PHYSICAL EXAM  Vitals:   08/24/16 1531  BP: (!) 180/102  Pulse: 92  Resp: 18  Weight: 217 lb (98.4 kg)  Height:  (1.778 m)    Body mass index is 31.14 kg/m.   General: The patient is well-developed and well-nourished and in no acute distress    Musculoskeletal:  Back is tender over L4 S1 paraspinals  Neurologic Exam  Mental status: The patient is alert and oriented x 3 at the time of the examination. The patient has apparent normal recent and remote memory, with an apparently normal attention span and concentration ability.   Speech is normal.  Cranial nerves: Extraocular movements are full.  He has right ptosis. Otherwise the face is symmetric. Pupils were reactive   facial strength and sensation is normal. Trapezius and sternocleidomastoid strength is normal. No dysarthria is noted.  The tongue is midline, and the patient has symmetric elevation of the soft palate. No obvious hearing deficits are noted.  Motor:  Muscle bulk is normal.   Tone is normal. Strength is  5 / 5 in all 4 extremities.   Sensory: Sensory testing is intact to pinprick, soft touch and vibration sensation in arms.   He reports reduced touch and vibration sensation in his toes. Sensation was  normal at the ankles and above..  Coordination: Cerebellar testing reveals good finger-nose-finger and heel-to-shin bilaterally.  Gait and station: Station is normal.   Gait is arthritic.     Reflexes: Deep tendon reflexes are symmetric and normal bilaterally.        DIAGNOSTIC DATA (LABS, IMAGING, TESTING) - I reviewed patient records, labs, notes, testing and imaging myself where available.  Lab Results  Component Value Date   WBC 6.3 12/27/2015   HGB 12.6 (L) 12/27/2015   HCT 37.5 (L) 12/27/2015   MCV 95.4 12/27/2015   PLT 232 12/27/2015      Component Value Date/Time   NA 137 12/28/2015 0432   K 3.7 12/28/2015 0432   CL 102 12/28/2015 0432   CO2 30 12/28/2015 0432   GLUCOSE 137 (H) 12/28/2015 0432   BUN 12 12/28/2015 0432   CREATININE 0.89 12/28/2015 0432   CALCIUM 8.6 (L) 12/28/2015 0432   PROT 6.3 (L) 12/27/2015 0412   ALBUMIN 3.1 (L) 12/27/2015 0412   AST 18 12/27/2015 0412   ALT 13 (L) 12/27/2015 0412   ALKPHOS 64 12/27/2015 0412   BILITOT 0.3 12/27/2015 0412   GFRNONAA >60 12/28/2015 0432   GFRAA >60 12/28/2015 0432   No results found for: CHOL, HDL, LDLCALC, LDLDIRECT, TRIG, CHOLHDL Lab Results  Component Value Date   HGBA1C 6.1 (H) 12/27/2015       ASSESSMENT AND PLAN  Myasthenia gravis (HCC) - Plan: CBC with Differential/Platelet, Hepatic function panel  Chronic midline low back pain, with sciatica presence unspecified - Plan: Ambulatory referral to Pain Clinic, MR LUMBAR SPINE WO CONTRAST  Lumbar radiculopathy - Plan: Ambulatory referral to Pain Clinic, MR LUMBAR SPINE WO CONTRAST  Depression with anxiety  1.  Continue CellCept. We will check blood count and LFT 2.   In [place], he saw a pain management center. For one month of oxycodone 5 mg #90. When he was in [place] or he was seen in pain management and was on fentanyl plus oxycodone and had epidural steroid injections performed. 3.   Citalopram for depression and anxiety 4.    Renew  gabapentin 600 mg 3 times a day 5.  Return in 5 months or sooner if there are new or worsening neurologic symptoms.  Shanai Lartigue A. Epimenio Foot, MD, PhD 08/24/2016, 4:56 PM Certified in Neurology, Clinical Neurophysiology, Sleep Medicine, Pain Medicine and Neuroimaging  [facility] [address] [place], [place] [address] [phone]

## 2016-08-25 LAB — CBC WITH DIFFERENTIAL/PLATELET
BASOS ABS: 0.1 10*3/uL (ref 0.0–0.2)
Basos: 1 %
EOS (ABSOLUTE): 0.3 10*3/uL (ref 0.0–0.4)
Eos: 4 %
Hematocrit: 38 % (ref 37.5–51.0)
Hemoglobin: 12.8 g/dL — ABNORMAL LOW (ref 13.0–17.7)
Immature Grans (Abs): 0.1 10*3/uL (ref 0.0–0.1)
Immature Granulocytes: 1 %
LYMPHS ABS: 2.5 10*3/uL (ref 0.7–3.1)
Lymphs: 31 %
MCH: 34.3 pg — AB (ref 26.6–33.0)
MCHC: 33.7 g/dL (ref 31.5–35.7)
MCV: 102 fL — ABNORMAL HIGH (ref 79–97)
MONOS ABS: 0.8 10*3/uL (ref 0.1–0.9)
Monocytes: 10 %
NEUTROS ABS: 4.1 10*3/uL (ref 1.4–7.0)
Neutrophils: 53 %
PLATELETS: 205 10*3/uL (ref 150–379)
RBC: 3.73 x10E6/uL — ABNORMAL LOW (ref 4.14–5.80)
RDW: 15.3 % (ref 12.3–15.4)
WBC: 8 10*3/uL (ref 3.4–10.8)

## 2016-08-25 LAB — HEPATIC FUNCTION PANEL
ALT: 32 IU/L (ref 0–44)
AST: 39 IU/L (ref 0–40)
Albumin: 4.2 g/dL (ref 3.5–5.5)
Alkaline Phosphatase: 93 IU/L (ref 39–117)
Bilirubin Total: <0.2 mg/dL (ref 0.0–1.2)
Bilirubin, Direct: 0.12 mg/dL (ref 0.00–0.40)
TOTAL PROTEIN: 6.2 g/dL (ref 6.0–8.5)

## 2016-08-28 ENCOUNTER — Telehealth: Payer: Self-pay | Admitting: *Deleted

## 2016-08-28 NOTE — Telephone Encounter (Signed)
LMOM (identified vm) that per RAS, lab work done in our office is fine./fim 

## 2016-08-28 NOTE — Telephone Encounter (Signed)
-----   Message from Asa Lente, MD sent at 08/25/2016  3:39 PM EDT ----- Please let the patient know that the lab work is fine.

## 2016-08-29 ENCOUNTER — Telehealth: Payer: Self-pay | Admitting: Neurology

## 2016-08-29 NOTE — Telephone Encounter (Signed)
Pt request referral for pain clinic closer to [facility]

## 2016-08-29 NOTE — Telephone Encounter (Signed)
Pt said he is returning Emily's call

## 2016-08-30 MED ORDER — ALPRAZOLAM 0.5 MG PO TABS: ORAL_TABLET | ORAL | 0 refills | Status: DC

## 2016-08-30 NOTE — Telephone Encounter (Signed)
Patient returned my call I scheduled his MRI for 09/09/16 at [facility] and he is aware of the time and day.Kurt Horton He did say he is claustrophic and will need something to calm him.Kurt Horton He also informed me that that cellcept is not working for him.. His vision is double and his right eye lid almost close's all the way. He state he can't drive and can't work, the system have not gone away..   He also state that he wants a referral to a pain clinic in [place].

## 2016-08-30 NOTE — Telephone Encounter (Signed)
I called the patient back and left a voicemail for him to call me back about scheduling MRI at [place].

## 2016-08-30 NOTE — Telephone Encounter (Signed)
Sent to [facility]  [phone].  Referral has already ben sent.

## 2016-08-30 NOTE — Telephone Encounter (Signed)
I have spoken with Erubiel this afternoon. 1-- Will fax Xanax rx. to his pharmacy when RAS returns to the office next week.  (MRI is 09-09-16.) 2-- Annabelle Harman has referred him to a pain mx. clinic closer to him and lmom for him about this. 3--I offered appt. with NP tomorrow, but he sts. increase in ptosis and double vision are not new for him--they continue to be intermiitent, and he doesn't feel he needs to be seen earlier for sx. He will call back if he changes his mind/fim

## 2016-08-30 NOTE — Telephone Encounter (Signed)
Noted/fim 

## 2016-09-04 NOTE — Telephone Encounter (Signed)
Rx. has been faxed this morning/fim

## 2016-09-05 ENCOUNTER — Telehealth: Payer: Self-pay | Admitting: Neurology

## 2016-09-05 NOTE — Telephone Encounter (Signed)
Patient called office requesting refill for clonazePAM (KLONOPIN) 0.5 MG tablet.

## 2016-09-05 NOTE — Telephone Encounter (Signed)
LMOM (identified vm) that, as RAS has referred him to pain mx., it would be most appropriate for them to manage Clonazepam as well.  Pt. should not have rx's for controlled substances from mult. providers/fim

## 2016-09-09 ENCOUNTER — Ambulatory Visit
Admission: RE | Admit: 2016-09-09 | Discharge: 2016-09-09 | Disposition: A | Discharge status: Home / Self Care | Payer: Medicaid Other | Source: Ambulatory Visit | Attending: Neurology | Admitting: Neurology

## 2016-09-09 DIAGNOSIS — M545 Low back pain: Secondary | ICD-10-CM | POA: Diagnosis present

## 2016-09-09 DIAGNOSIS — M5416 Radiculopathy, lumbar region: Secondary | ICD-10-CM | POA: Diagnosis present

## 2016-09-09 DIAGNOSIS — M48061 Spinal stenosis, lumbar region without neurogenic claudication: Secondary | ICD-10-CM | POA: Insufficient documentation

## 2016-09-09 DIAGNOSIS — M5126 Other intervertebral disc displacement, lumbar region: Secondary | ICD-10-CM | POA: Diagnosis not present

## 2016-09-09 DIAGNOSIS — M4316 Spondylolisthesis, lumbar region: Secondary | ICD-10-CM | POA: Insufficient documentation

## 2016-09-09 DIAGNOSIS — M4186 Other forms of scoliosis, lumbar region: Secondary | ICD-10-CM | POA: Insufficient documentation

## 2016-09-09 DIAGNOSIS — M5136 Other intervertebral disc degeneration, lumbar region: Secondary | ICD-10-CM | POA: Insufficient documentation

## 2016-09-09 DIAGNOSIS — G8929 Other chronic pain: Secondary | ICD-10-CM | POA: Diagnosis present

## 2016-09-15 ENCOUNTER — Telehealth: Payer: Self-pay | Admitting: *Deleted

## 2016-09-15 NOTE — Telephone Encounter (Signed)
LMTC./fim 

## 2016-09-15 NOTE — Telephone Encounter (Signed)
-----   Message from Asa Lenteichard A Sater, MD sent at 09/11/2016  5:41 PM EDT ----- Please let [name] know that the MRI of the lumbar spine shows multilevel disc changes, worse at L2-L3 where he has a disc herniation , more to the left. It causes spinal stenosis and also could affect the left L3 nerve root.    We can set up a lumbar ESI for the left L3 nerve root. He lives in [place] so would probably prefer to be done there.. If the ESI does not help, we could refer him to neurosurgery.

## 2016-09-25 ENCOUNTER — Telehealth: Payer: Self-pay | Admitting: Pain Medicine

## 2016-09-25 NOTE — Telephone Encounter (Signed)
Wants to speak with Nurse or Dr. Laban EmperorNaveira before he comes in for appt sched on Tue 09-25-16. Says he is bed ridden most of the time and needs to speak with someone before he comes here.

## 2016-09-25 NOTE — Telephone Encounter (Signed)
Patient asking if he will be getting pain medications during his new patient appointment tomorrow. Patient advised that it is Dr. Waynetta SandyNaveira's policy to not give narcotic analgesics at first visit. Patient requested appointment to be cancelled.

## 2016-09-26 ENCOUNTER — Ambulatory Visit: Payer: Medicaid Other | Admitting: Pain Medicine

## 2016-10-03 NOTE — Telephone Encounter (Signed)
Pt returned your call and is asking to be called back °

## 2016-10-04 ENCOUNTER — Telehealth: Payer: Self-pay | Admitting: *Deleted

## 2016-10-04 NOTE — Telephone Encounter (Signed)
-----   Message from Asa Lenteichard A Sater, MD sent at 09/11/2016  5:41 PM EDT ----- Please let [name] know that the MRI of the lumbar spine shows multilevel disc changes, worse at L2-L3 where he has a disc herniation , more to the left. It causes spinal stenosis and also could affect the left L3 nerve root.    We can set up a lumbar ESI for the left L3 nerve root. He lives in [place] so would probably prefer to be done there.. If the ESI does not help, we could refer him to neurosurgery.

## 2016-10-04 NOTE — Telephone Encounter (Signed)
LMTC./fim 

## 2016-10-04 NOTE — Telephone Encounter (Signed)
I have spoken with Jonny RuizJohn this afternoon and per RAS, reviewed MRI results as below, and offered ESI.  He verbalized understanding of same, sts. mult. esi's have not helped in the past. He will consider NS referral, but currently is seeking med. pain mx.  Has an appt. with Dr. Maryanna ShapeGreg Crisp and would like copy of MRI report to take with him to appt.  MRI report up front [facility] for pt. to pick up/fim

## 2016-10-04 NOTE — Telephone Encounter (Signed)
-----   Message from Richard A Sater, MD sent at 09/11/2016  5:41 PM EDT ----- Please let Mr. Aarons know that the MRI of the lumbar spine shows multilevel disc changes, worse at L2-L3 where he has a disc herniation , more to the left. It causes spinal stenosis and also could affect the left L3 nerve root.    We can set up a lumbar ESI for the left L3 nerve root. He lives in [place] so would probably prefer to be done there.. If the ESI does not help, we could refer him to neurosurgery. 

## 2016-10-10 ENCOUNTER — Telehealth: Payer: Self-pay | Admitting: Neurology

## 2016-10-10 NOTE — Telephone Encounter (Signed)
Patient is wanting a copy of all his medical Records. To Dr. Metta Clinesrisp [facility]. Telephone [phone] - fax [phone]. Patient wants records faxed today his apt is at 1:00 today .

## 2016-10-11 ENCOUNTER — Telehealth: Payer: Self-pay | Admitting: Neurology

## 2016-10-11 MED ORDER — OXYCODONE HCL 5 MG PO TABS: 5.0000 mg | ORAL_TABLET | Freq: Three times a day (TID) | ORAL | 0 refills | Status: DC | PRN

## 2016-10-11 NOTE — Telephone Encounter (Signed)
LMTC./fim 

## 2016-10-11 NOTE — Addendum Note (Signed)
Addended by: Candis SchatzMISENHEIMER, Thadius Smisek I on: 10/11/2016 03:39 PM   Modules accepted: Orders

## 2016-10-11 NOTE — Telephone Encounter (Signed)
Per RAS, he will give rx. for Oxycodone 5mg  #90 for one month only, will not rx. Fentanyl, as he is not able to manage chronic pain.  Rx. awaiting RAS sig/fim

## 2016-10-11 NOTE — Telephone Encounter (Signed)
I have spoken with Kurt RuizJohn this afternoon. He sts. he has seen Dr. Maryanna ShapeGreg Crisp and was told he is not able to rx. pain meds for the first month, and rec. pt. ask RAS to rx. Fentanyl and Oxycodone thru 11/06/16.  Will check with RAS and call pt. back/fim

## 2016-10-11 NOTE — Telephone Encounter (Signed)
Patient calling to discuss appointment he had with Dr. Ewing SchleinGregory Crisp.

## 2016-10-11 NOTE — Telephone Encounter (Signed)
Pt returned RN's call °

## 2016-10-11 NOTE — Telephone Encounter (Signed)
Oxycodone rx. up front [facility]/fim 

## 2016-10-17 ENCOUNTER — Telehealth: Payer: Self-pay | Admitting: *Deleted

## 2016-10-17 DIAGNOSIS — G8929 Other chronic pain: Secondary | ICD-10-CM

## 2016-10-17 DIAGNOSIS — M545 Low back pain: Principal | ICD-10-CM

## 2016-10-17 DIAGNOSIS — M48061 Spinal stenosis, lumbar region without neurogenic claudication: Secondary | ICD-10-CM

## 2016-10-17 DIAGNOSIS — M5416 Radiculopathy, lumbar region: Secondary | ICD-10-CM

## 2016-10-18 NOTE — Telephone Encounter (Signed)
I have spoken with Kurt Horton this morning.  He does not wish to have esi's as he doesn't feel they have helped in the past.  Is seeing Dr. Metta Clinesrisp for chronic pain mx.  He would like NS referral.  Ok with RAS.  Order in EPIC/fim

## 2016-10-26 NOTE — Telephone Encounter (Signed)
Called Patient and left him a message asking if he wanted to go to [facility] .  Relayed on voice mail I would be out of the office 10/27/2016 , I will will return 10/30/2016.

## 2016-10-26 NOTE — Telephone Encounter (Signed)
Patient called office in reference to Neurosurgery referral he is going to reach out to [facility], but would like to know if a referral could be done possibly closer to where he lives in [place].  Please call

## 2016-10-26 NOTE — Telephone Encounter (Addendum)
Pt request to go to [facility]. He said any information can be on his VM

## 2016-10-31 NOTE — Telephone Encounter (Signed)
Sent referral to [facility] Patient aware . Telephone [phone]  Fax [phone].

## 2016-11-01 ENCOUNTER — Telehealth: Payer: Self-pay | Admitting: *Deleted

## 2016-11-01 NOTE — Telephone Encounter (Signed)
Received fax from [facility] phone# [phone], fax# [phone], advising that pt. has declined a new pt. appt. for eval for procedures for pain relief; stated he wanted medicinal pain mx. only/fim

## 2016-11-10 ENCOUNTER — Telehealth: Payer: Self-pay | Admitting: Neurology

## 2016-11-10 NOTE — Telephone Encounter (Signed)
Kurt SheldonAshley from [facility] called to inform that they are declining to schedule New Pt because pt has [facility] out [facility], Dr Desmond Lopetehre is recommending a Dr out of [place]

## 2016-11-11 ENCOUNTER — Emergency Department
Admission: EM | Admit: 2016-11-11 | Discharge: 2016-11-11 | Disposition: A | Discharge status: Home / Self Care | Payer: Medicaid Other | Attending: Emergency Medicine | Admitting: Emergency Medicine

## 2016-11-11 ENCOUNTER — Encounter: Payer: Self-pay | Admitting: Emergency Medicine

## 2016-11-11 DIAGNOSIS — Z79899 Other long term (current) drug therapy: Secondary | ICD-10-CM | POA: Insufficient documentation

## 2016-11-11 DIAGNOSIS — G8929 Other chronic pain: Secondary | ICD-10-CM | POA: Diagnosis not present

## 2016-11-11 DIAGNOSIS — Z79891 Long term (current) use of opiate analgesic: Secondary | ICD-10-CM | POA: Insufficient documentation

## 2016-11-11 DIAGNOSIS — Z7984 Long term (current) use of oral hypoglycemic drugs: Secondary | ICD-10-CM | POA: Insufficient documentation

## 2016-11-11 DIAGNOSIS — E119 Type 2 diabetes mellitus without complications: Secondary | ICD-10-CM | POA: Insufficient documentation

## 2016-11-11 DIAGNOSIS — F1099 Alcohol use, unspecified with unspecified alcohol-induced disorder: Secondary | ICD-10-CM | POA: Insufficient documentation

## 2016-11-11 DIAGNOSIS — I10 Essential (primary) hypertension: Secondary | ICD-10-CM | POA: Diagnosis not present

## 2016-11-11 DIAGNOSIS — M5440 Lumbago with sciatica, unspecified side: Secondary | ICD-10-CM | POA: Insufficient documentation

## 2016-11-11 DIAGNOSIS — Y908 Blood alcohol level of 240 mg/100 ml or more: Secondary | ICD-10-CM | POA: Diagnosis not present

## 2016-11-11 DIAGNOSIS — M545 Low back pain: Secondary | ICD-10-CM | POA: Diagnosis present

## 2016-11-11 LAB — BASIC METABOLIC PANEL
Anion gap: 13 (ref 5–15)
BUN: 9 mg/dL (ref 6–20)
CHLORIDE: 99 mmol/L — AB (ref 101–111)
CO2: 27 mmol/L (ref 22–32)
Calcium: 8.6 mg/dL — ABNORMAL LOW (ref 8.9–10.3)
Creatinine, Ser: 0.73 mg/dL (ref 0.61–1.24)
GFR calc Af Amer: >60 mL/min (ref >60)
GFR calc non Af Amer: >60 mL/min (ref >60)
GLUCOSE: 154 mg/dL — AB (ref 65–99)
POTASSIUM: 2.8 mmol/L — AB (ref 3.5–5.1)
Sodium: 139 mmol/L (ref 135–145)

## 2016-11-11 LAB — CBC
HEMATOCRIT: 36.2 % — AB (ref 40.0–52.0)
HEMOGLOBIN: 12.3 g/dL — AB (ref 13.0–18.0)
MCH: 34 pg (ref 26.0–34.0)
MCHC: 34 g/dL (ref 32.0–36.0)
MCV: 100.2 fL — AB (ref 80.0–100.0)
Platelets: 217 10*3/uL (ref 150–440)
RBC: 3.62 MIL/uL — AB (ref 4.40–5.90)
RDW: 15.6 % — ABNORMAL HIGH (ref 11.5–14.5)
WBC: 5.4 10*3/uL (ref 3.8–10.6)

## 2016-11-11 LAB — ETHANOL: Alcohol, Ethyl (B): 387 mg/dL (ref <5)

## 2016-11-11 MED ORDER — KETOROLAC TROMETHAMINE 60 MG/2ML IM SOLN
60.0000 mg | Freq: Once | INTRAMUSCULAR | Status: AC
  Administered 2016-11-11: 60 mg via INTRAMUSCULAR
  Filled 2016-11-11: qty 2

## 2016-11-11 NOTE — ED Notes (Signed)
Dr Lenard LancePaduchowski at bedside with pt.

## 2016-11-11 NOTE — ED Triage Notes (Signed)
States has had back pain since 2004. States he has had 2 falls in the past month. States increased pain. States has been using ETOH to relieve pain.

## 2016-11-11 NOTE — ED Provider Notes (Signed)
[facility] Emergency Department Provider Note  Time seen: 7:23 PM  I have reviewed the triage vital signs and the nursing notes.   HISTORY  Chief Complaint Back Pain    HPI Kurt Horton is a 56 y.o. male with a past medical history of chronic back pain, presents the emergency department for increased back pain. According to the patient for many years he suffered from lower back pain with sciatic type symptoms. Patient states over the past 2-3 weeks of back pain has been significantly worse. Patient admits to drinking alcohol to cope with the pain including today. States he is not prescribed any narcotic pain medications. Patient states he is trying to get in with a pain doctor but has not been able to do so yet.Denies any incontinence. States numbness of both of his feet but this is long-term per patient.  Past Medical History:  Diagnosis Date  . Diabetes mellitus without complication (HCC)   . DJD (degenerative joint disease)   . Hypertension   . Myasthenia gravis (HCC)   . Renal disorder     Patient Active Problem List   Diagnosis Date Noted  . Lumbar radiculopathy 08/24/2016  . Depression with anxiety 08/24/2016  . Chronic low back pain 01/04/2016  . Pressure ulcer 12/29/2015  . Osteomyelitis (HCC) 12/26/2015  . PVD (peripheral vascular disease) (HCC) 12/26/2015  . Type II diabetes mellitus with manifestations (HCC) 12/26/2015  . Myasthenia gravis (HCC) 12/26/2015    Past Surgical History:  Procedure Laterality Date  . AMPUTATION TOE Left 12/29/2015   Procedure: AMPUTATION TOE;  Surgeon: Gwyneth Revels, DPM;  Location: [facility] ORS;  Service: Podiatry;  Laterality: Left;  . PERIPHERAL VASCULAR CATHETERIZATION N/A 12/27/2015   Procedure: Lower Extremity Angiography;  Surgeon: Annice Needy, MD;  Location: [facility] INVASIVE CV LAB;  Service: Cardiovascular;  Laterality: N/A;    Prior to Admission medications   Medication Sig Start Date End Date Taking?  Authorizing Provider  ALPRAZolam Prudy Feeler) 0.5 MG tablet Take 1 or 2 tablets 30 min. prior to MRI.  You must not drink alcohol, drive, or operate dangerous equipment after taking this medication. 08/30/16   Sater, Pearletha Furl, MD  b complex vitamins tablet Take 1 tablet by mouth daily.    [provider]  citalopram (CELEXA) 20 MG tablet Take 1 tablet (20 mg total) by mouth daily. 08/24/16   Sater, Pearletha Furl, MD  clonazePAM (KLONOPIN) 0.5 MG tablet Take 1 tablet (0.5 mg total) by mouth 2 (two) times daily as needed for anxiety. Patient not taking: Reported on 08/24/2016 12/30/15   Alford Highland, MD  fentaNYL (DURAGESIC - DOSED MCG/HR) 50 MCG/HR Place 1 patch (50 mcg total) onto the skin every 3 (three) days. Patient not taking: Reported on 08/24/2016 01/04/16   Asa Lente, MD  furosemide (LASIX) 40 MG tablet Take 40 mg by mouth daily as needed for fluid (if taking prednisone).     [provider]  gabapentin (NEURONTIN) 300 MG capsule Take 2 capsules (600 mg total) by mouth 3 (three) times daily. 08/24/16   Sater, Pearletha Furl, MD  levofloxacin (LEVAQUIN) 500 MG tablet Take 1 tablet (500 mg total) by mouth daily. Patient not taking: Reported on 08/24/2016 12/30/15   Alford Highland, MD  lidocaine-prilocaine (EMLA) cream Apply 1 application topically as needed. Apply to feet twice daily.    [provider]  lisinopril (PRINIVIL,ZESTRIL) 10 MG tablet Take 1 tablet (10 mg total) by mouth daily. 12/30/15   Wieting,  Richard, MD  metFORMIN (GLUCOPHAGE) 500 MG tablet Take 1 tablet (500 mg total) by mouth daily. 12/30/15   Alford Highland, MD  mycophenolate (CELLCEPT) 500 MG tablet Take one pill in the morning and two pills at night. 01/04/16   Sater, Pearletha Furl, MD  oxyCODONE (OXY IR/ROXICODONE) 5 MG immediate release tablet Take 1 tablet (5 mg total) by mouth every 8 (eight) hours as needed. 10/11/16   Sater, Pearletha Furl, MD  SUMAtriptan (IMITREX) 100 MG tablet Take 1 tablet (100 mg total)  by mouth every 2 (two) hours as needed for migraine. May repeat in 2 hours if headache persists or recurs. Patient not taking: Reported on 08/24/2016 12/30/15   Alford Highland, MD  zolpidem (AMBIEN) 10 MG tablet Take 1 tablet (10 mg total) by mouth at bedtime as needed for sleep. 12/30/15   Alford Highland, MD    Allergies  Allergen Reactions  . Other Other (See Comments)    Patient states he can't take any "mycin" medications because myasthenia gravis.    Family History  Problem Relation Age of Onset  . Rheum arthritis Father     Social History Social History  Substance Use Topics  . Smoking status: Never Smoker  . Smokeless tobacco: Never Used  . Alcohol use Not on file    Review of Systems Constitutional: Negative for fever. Cardiovascular: Negative for chest pain. Respiratory: Negative for shortness of breath. Gastrointestinal: Negative for abdominal pain Genitourinary: Negative for incontinence Musculoskeletal: Severe lower back pain per patient Skin: Negative for rash. Neurological: Negative for headache All other ROS negative  ____________________________________________   PHYSICAL EXAM:  VITAL SIGNS: ED Triage Vitals  Enc Vitals Group     BP 11/11/16 1854 (!) 173/109     Pulse Rate 11/11/16 1854 99     Resp 11/11/16 1854 20     Temp 11/11/16 1854 98.1 F (36.7 C)     Temp Source 11/11/16 1854 Oral     SpO2 11/11/16 1854 96 %     Weight 11/11/16 1855 195 lb (88.5 kg)     Height 11/11/16 1855 5\' 11"  (1.803 m)     Head Circumference --      Peak Flow --      Pain Score 11/11/16 1854 10     Pain Loc --      Pain Edu? --      Excl. in GC? --     Constitutional: Very somnolent, falling asleep at times. Appears to be intoxicated. Eyes: Normal exam ENT   Head: Normocephalic and atraumatic.   Mouth/Throat: Mucous membranes are moist. Cardiovascular: Normal rate, regular rhythm. No murmur Respiratory: Normal respiratory effort without tachypnea  nor retractions. Breath sounds are clear  Gastrointestinal: Soft and nontender. No distention.   Musculoskeletal: Nontender with normal range of motion in all extremities. Able to move all extremities including lower extremities. Neurologic:  Normal speech and language. No gross focal neurologic deficits Skin:  Skin is warm, dry and intact.  Psychiatric: Mood and affect are normal. Denies SI or HI.  ____________________________________________    INITIAL IMPRESSION / ASSESSMENT AND PLAN / ED COURSE  Pertinent labs & imaging results that were available during my care of the patient were reviewed by me and considered in my medical decision making (see chart for details).  Patient presents to the emergency department with increased lower back pain for the past 2-3 weeks per patient. He states long-term lower back pain times many years. Patient denies taking any narcotic pain  medications states it has been months or years since he is prescribed a pain medicine. Is currently attempting to get into pain management has not been able to do so yet per patient. In reviewing the patient's controlled substance database he appears to have filled 60 tablets of 5 mg oxycodone on 5/30 as well as 90 tablets of 5 mg oxycodone on 10/16/16. Patient also has phone messages in our system trying to get in with different specialist however of note on 6/27 there is a note from pain management that the patient has declined a new patient appointment for evaluation for procedures for pain relief stating that he only wanted medication for pain relief. Here we will check labs including ethanol level. We will dose of IM Toradol for pain control while awaiting results. Patient does appear to be somewhat intoxicated and somnolent at times.  Patient's labs show significant alcohol intoxication. However the patient is awake and speaking, drinking water without issue. His father is here with him. I discussed the patient's significant  alcohol elevation. The patient does drink alcohol daily. I discussed that we could not give him narcotic pain medications as he is currently prescribed pain medications as an outpatient which was spilled 6/11 and should be a 30 day supply. Patient is upset about this however he understands. His dad states he will take the patient home I believe this is a reasonable plan of care for the patient and he will follow-up with Dr. crisp later this month. I offered the patient detox from alcohol and opiates and to speak to a TTS counselor. The patient is refusing at this time saying he does not need to detox.  ____________________________________________   FINAL CLINICAL IMPRESSION(S) / ED DIAGNOSES  Back pain Alcohol use disorder    Minna AntisPaduchowski, Jorah Hua, MD 11/11/16 2116

## 2016-11-12 ENCOUNTER — Inpatient Hospital Stay
Admission: EM | Admit: 2016-11-12 | Discharge: 2016-11-14 | DRG: 897 | Disposition: A | Discharge status: Home / Self Care | Payer: Medicaid Other | Attending: Internal Medicine | Admitting: Internal Medicine

## 2016-11-12 DIAGNOSIS — I10 Essential (primary) hypertension: Secondary | ICD-10-CM

## 2016-11-12 DIAGNOSIS — I471 Supraventricular tachycardia: Secondary | ICD-10-CM | POA: Diagnosis present

## 2016-11-12 DIAGNOSIS — Z7984 Long term (current) use of oral hypoglycemic drugs: Secondary | ICD-10-CM

## 2016-11-12 DIAGNOSIS — F419 Anxiety disorder, unspecified: Secondary | ICD-10-CM | POA: Diagnosis present

## 2016-11-12 DIAGNOSIS — F1994 Other psychoactive substance use, unspecified with psychoactive substance-induced mood disorder: Secondary | ICD-10-CM

## 2016-11-12 DIAGNOSIS — F10939 Alcohol use, unspecified with withdrawal, unspecified: Secondary | ICD-10-CM

## 2016-11-12 DIAGNOSIS — Z881 Allergy status to other antibiotic agents status: Secondary | ICD-10-CM

## 2016-11-12 DIAGNOSIS — F329 Major depressive disorder, single episode, unspecified: Secondary | ICD-10-CM | POA: Diagnosis present

## 2016-11-12 DIAGNOSIS — F10231 Alcohol dependence with withdrawal delirium: Principal | ICD-10-CM | POA: Diagnosis present

## 2016-11-12 DIAGNOSIS — E118 Type 2 diabetes mellitus with unspecified complications: Secondary | ICD-10-CM | POA: Diagnosis present

## 2016-11-12 DIAGNOSIS — F1020 Alcohol dependence, uncomplicated: Secondary | ICD-10-CM

## 2016-11-12 DIAGNOSIS — F10239 Alcohol dependence with withdrawal, unspecified: Secondary | ICD-10-CM | POA: Diagnosis present

## 2016-11-12 DIAGNOSIS — G8929 Other chronic pain: Secondary | ICD-10-CM | POA: Diagnosis present

## 2016-11-12 DIAGNOSIS — Z79899 Other long term (current) drug therapy: Secondary | ICD-10-CM

## 2016-11-12 DIAGNOSIS — M549 Dorsalgia, unspecified: Secondary | ICD-10-CM | POA: Diagnosis present

## 2016-11-12 DIAGNOSIS — G7000 Myasthenia gravis without (acute) exacerbation: Secondary | ICD-10-CM | POA: Diagnosis present

## 2016-11-12 DIAGNOSIS — M1990 Unspecified osteoarthritis, unspecified site: Secondary | ICD-10-CM | POA: Diagnosis present

## 2016-11-12 HISTORY — DX: Alcohol dependence with withdrawal, unspecified: F10.239

## 2016-11-12 HISTORY — DX: Alcohol use, unspecified with withdrawal, unspecified: F10.939

## 2016-11-12 LAB — COMPREHENSIVE METABOLIC PANEL
ALBUMIN: 3.5 g/dL (ref 3.5–5.0)
ALK PHOS: 72 U/L (ref 38–126)
ALT: 42 U/L (ref 17–63)
AST: 74 U/L — AB (ref 15–41)
Anion gap: 13 (ref 5–15)
BILIRUBIN TOTAL: 0.9 mg/dL (ref 0.3–1.2)
BUN: 11 mg/dL (ref 6–20)
CALCIUM: 8.8 mg/dL — AB (ref 8.9–10.3)
CO2: 26 mmol/L (ref 22–32)
Chloride: 94 mmol/L — ABNORMAL LOW (ref 101–111)
Creatinine, Ser: 0.65 mg/dL (ref 0.61–1.24)
GFR calc Af Amer: >60 mL/min (ref >60)
GLUCOSE: 204 mg/dL — AB (ref 65–99)
POTASSIUM: 3 mmol/L — AB (ref 3.5–5.1)
Sodium: 133 mmol/L — ABNORMAL LOW (ref 135–145)
TOTAL PROTEIN: 6 g/dL — AB (ref 6.5–8.1)

## 2016-11-12 LAB — URINE DRUG SCREEN, QUALITATIVE (ARMC ONLY)
Amphetamines, Ur Screen: NOT DETECTED
BENZODIAZEPINE, UR SCRN: POSITIVE — AB
Barbiturates, Ur Screen: NOT DETECTED
CANNABINOID 50 NG, UR ~~LOC~~: NOT DETECTED
COCAINE METABOLITE, UR ~~LOC~~: NOT DETECTED
MDMA (Ecstasy)Ur Screen: NOT DETECTED
METHADONE SCREEN, URINE: NOT DETECTED
Opiate, Ur Screen: NOT DETECTED
Phencyclidine (PCP) Ur S: NOT DETECTED
TRICYCLIC, UR SCREEN: NOT DETECTED

## 2016-11-12 LAB — CBC
HCT: 32.5 % — ABNORMAL LOW (ref 40.0–52.0)
HEMOGLOBIN: 11.4 g/dL — AB (ref 13.0–18.0)
MCH: 34.9 pg — AB (ref 26.0–34.0)
MCHC: 35.1 g/dL (ref 32.0–36.0)
MCV: 99.4 fL (ref 80.0–100.0)
PLATELETS: 189 10*3/uL (ref 150–440)
RBC: 3.27 MIL/uL — ABNORMAL LOW (ref 4.40–5.90)
RDW: 15.2 % — AB (ref 11.5–14.5)
WBC: 5.4 10*3/uL (ref 3.8–10.6)

## 2016-11-12 LAB — GLUCOSE, CAPILLARY
GLUCOSE-CAPILLARY: 153 mg/dL — AB (ref 65–99)
Glucose-Capillary: 141 mg/dL — ABNORMAL HIGH (ref 65–99)

## 2016-11-12 LAB — TSH: TSH: 0.989 u[IU]/mL (ref 0.350–4.500)

## 2016-11-12 LAB — MAGNESIUM: Magnesium: 1.4 mg/dL — ABNORMAL LOW (ref 1.7–2.4)

## 2016-11-12 LAB — ETHANOL: ALCOHOL ETHYL (B): 53 mg/dL — AB (ref <5)

## 2016-11-12 MED ORDER — ADENOSINE 12 MG/4ML IV SOLN
12.0000 mg | Freq: Once | INTRAVENOUS | Status: AC
  Administered 2016-11-12: 12 mg via INTRAVENOUS

## 2016-11-12 MED ORDER — LORAZEPAM 2 MG/ML IJ SOLN
1.0000 mg | Freq: Once | INTRAMUSCULAR | Status: AC
  Administered 2016-11-12: 1 mg via INTRAVENOUS
  Filled 2016-11-12: qty 1

## 2016-11-12 MED ORDER — ONDANSETRON HCL 4 MG/2ML IJ SOLN: 4.0000 mg | Freq: Four times a day (QID) | INTRAMUSCULAR | Status: DC | PRN

## 2016-11-12 MED ORDER — DILTIAZEM HCL 60 MG PO TABS
60.0000 mg | ORAL_TABLET | Freq: Once | ORAL | Status: AC
Start: 2016-11-12 — End: 2016-11-12
  Administered 2016-11-12: 60 mg via ORAL
  Filled 2016-11-12: qty 1

## 2016-11-12 MED ORDER — HYDRALAZINE HCL 20 MG/ML IJ SOLN: 10.0000 mg | Freq: Four times a day (QID) | INTRAMUSCULAR | Status: DC | PRN

## 2016-11-12 MED ORDER — DM-GUAIFENESIN ER 30-600 MG PO TB12: 1.0000 | ORAL_TABLET | Freq: Two times a day (BID) | ORAL | Status: DC

## 2016-11-12 MED ORDER — INSULIN ASPART 100 UNIT/ML ~~LOC~~ SOLN
0.0000 [IU] | Freq: Three times a day (TID) | SUBCUTANEOUS | Status: DC
  Administered 2016-11-12: 2 [IU] via SUBCUTANEOUS
  Administered 2016-11-13: 1 [IU] via SUBCUTANEOUS
  Administered 2016-11-13: 2 [IU] via SUBCUTANEOUS
  Administered 2016-11-14: 1 [IU] via SUBCUTANEOUS
  Filled 2016-11-12 (×4): qty 1

## 2016-11-12 MED ORDER — MYCOPHENOLATE MOFETIL 250 MG PO CAPS
500.0000 mg | ORAL_CAPSULE | Freq: Every day | ORAL | Status: DC
  Administered 2016-11-13 – 2016-11-14 (×2): 500 mg via ORAL
  Filled 2016-11-12 (×2): qty 2

## 2016-11-12 MED ORDER — OXYCODONE HCL 5 MG PO TABS
5.0000 mg | ORAL_TABLET | Freq: Four times a day (QID) | ORAL | Status: DC | PRN
  Administered 2016-11-12 – 2016-11-13 (×3): 5 mg via ORAL
  Filled 2016-11-12 (×3): qty 1

## 2016-11-12 MED ORDER — GUAIFENESIN ER 600 MG PO TB12
600.0000 mg | ORAL_TABLET | Freq: Two times a day (BID) | ORAL | Status: DC
  Administered 2016-11-13 (×2): 600 mg via ORAL
  Filled 2016-11-12 (×3): qty 1

## 2016-11-12 MED ORDER — METFORMIN HCL 500 MG PO TABS
500.0000 mg | ORAL_TABLET | Freq: Every day | ORAL | Status: DC
  Administered 2016-11-12 – 2016-11-13 (×2): 500 mg via ORAL
  Filled 2016-11-12 (×2): qty 1

## 2016-11-12 MED ORDER — METOPROLOL TARTRATE 25 MG PO TABS
25.0000 mg | ORAL_TABLET | Freq: Two times a day (BID) | ORAL | Status: DC
  Administered 2016-11-12 – 2016-11-14 (×5): 25 mg via ORAL
  Filled 2016-11-12 (×6): qty 1

## 2016-11-12 MED ORDER — POTASSIUM CHLORIDE IN NACL 20-0.9 MEQ/L-% IV SOLN
INTRAVENOUS | Status: DC
  Administered 2016-11-12 – 2016-11-13 (×2): via INTRAVENOUS
  Filled 2016-11-12 (×4): qty 1000

## 2016-11-12 MED ORDER — MYCOPHENOLATE MOFETIL 250 MG PO CAPS
1000.0000 mg | ORAL_CAPSULE | Freq: Every day | ORAL | Status: DC
  Administered 2016-11-12 – 2016-11-13 (×2): 1000 mg via ORAL
  Filled 2016-11-12 (×3): qty 4

## 2016-11-12 MED ORDER — LISINOPRIL 10 MG PO TABS
10.0000 mg | ORAL_TABLET | Freq: Every day | ORAL | Status: DC
  Administered 2016-11-12 – 2016-11-14 (×3): 10 mg via ORAL
  Filled 2016-11-12 (×3): qty 1

## 2016-11-12 MED ORDER — ADENOSINE 12 MG/4ML IV SOLN
INTRAVENOUS | Status: AC
  Administered 2016-11-12: 6 mg via INTRAVENOUS
  Filled 2016-11-12: qty 8

## 2016-11-12 MED ORDER — LORAZEPAM 2 MG/ML IJ SOLN
1.0000 mg | Freq: Once | INTRAMUSCULAR | Status: AC
  Administered 2016-11-12: 1 mg via INTRAVENOUS

## 2016-11-12 MED ORDER — LOPERAMIDE HCL 2 MG PO CAPS
2.0000 mg | ORAL_CAPSULE | Freq: Two times a day (BID) | ORAL | Status: DC | PRN
  Administered 2016-11-12: 2 mg via ORAL
  Filled 2016-11-12: qty 1

## 2016-11-12 MED ORDER — SODIUM CHLORIDE 0.9 % IV BOLUS (SEPSIS)
1000.0000 mL | Freq: Once | INTRAVENOUS | Status: AC
  Administered 2016-11-12: 1000 mL via INTRAVENOUS

## 2016-11-12 MED ORDER — LORAZEPAM 2 MG/ML IJ SOLN
1.0000 mg | INTRAMUSCULAR | Status: DC | PRN
  Administered 2016-11-12 – 2016-11-14 (×9): 1 mg via INTRAVENOUS
  Filled 2016-11-12 (×8): qty 1

## 2016-11-12 MED ORDER — POTASSIUM CHLORIDE CRYS ER 20 MEQ PO TBCR
40.0000 meq | EXTENDED_RELEASE_TABLET | Freq: Once | ORAL | Status: AC
Start: 2016-11-12 — End: 2016-11-12
  Administered 2016-11-12: 40 meq via ORAL
  Filled 2016-11-12: qty 2

## 2016-11-12 MED ORDER — ADENOSINE 6 MG/2ML IV SOLN
6.0000 mg | Freq: Once | INTRAVENOUS | Status: AC
  Administered 2016-11-12: 6 mg via INTRAVENOUS

## 2016-11-12 MED ORDER — ADENOSINE 12 MG/4ML IV SOLN: 12.0000 mg | Freq: Once | INTRAVENOUS | Status: DC

## 2016-11-12 MED ORDER — ONDANSETRON HCL 4 MG/2ML IJ SOLN
4.0000 mg | Freq: Once | INTRAMUSCULAR | Status: AC
  Administered 2016-11-12: 4 mg via INTRAVENOUS

## 2016-11-12 MED ORDER — DOCUSATE SODIUM 100 MG PO CAPS
100.0000 mg | ORAL_CAPSULE | Freq: Two times a day (BID) | ORAL | Status: DC
  Filled 2016-11-12 (×2): qty 1

## 2016-11-12 MED ORDER — DILTIAZEM HCL 30 MG PO TABS
60.0000 mg | ORAL_TABLET | Freq: Four times a day (QID) | ORAL | Status: DC
  Administered 2016-11-12 – 2016-11-14 (×7): 60 mg via ORAL
  Filled 2016-11-12 (×5): qty 2
  Filled 2016-11-12: qty 1
  Filled 2016-11-12 (×2): qty 2

## 2016-11-12 MED ORDER — DEXTROMETHORPHAN POLISTIREX ER 30 MG/5ML PO SUER
30.0000 mg | Freq: Two times a day (BID) | ORAL | Status: DC
  Administered 2016-11-12 – 2016-11-14 (×4): 30 mg via ORAL
  Filled 2016-11-12 (×5): qty 5

## 2016-11-12 MED ORDER — MYCOPHENOLATE MOFETIL 500 MG PO TABS: 500.0000 mg | ORAL_TABLET | Freq: Two times a day (BID) | ORAL | Status: DC

## 2016-11-12 MED ORDER — ONDANSETRON HCL 4 MG PO TABS: 4.0000 mg | ORAL_TABLET | Freq: Four times a day (QID) | ORAL | Status: DC | PRN

## 2016-11-12 MED ORDER — BISACODYL 10 MG RE SUPP: 10.0000 mg | Freq: Every day | RECTAL | Status: DC | PRN

## 2016-11-12 MED ORDER — ADENOSINE 6 MG/2ML IV SOLN
INTRAVENOUS | Status: AC
  Filled 2016-11-12: qty 2

## 2016-11-12 MED ORDER — ACETAMINOPHEN 650 MG RE SUPP: 650.0000 mg | Freq: Four times a day (QID) | RECTAL | Status: DC | PRN

## 2016-11-12 MED ORDER — ONDANSETRON HCL 4 MG/2ML IJ SOLN
INTRAMUSCULAR | Status: AC
  Filled 2016-11-12: qty 2

## 2016-11-12 MED ORDER — ACETAMINOPHEN 325 MG PO TABS
650.0000 mg | ORAL_TABLET | Freq: Four times a day (QID) | ORAL | Status: DC | PRN
  Administered 2016-11-14: 650 mg via ORAL
  Filled 2016-11-12: qty 2

## 2016-11-12 MED ORDER — MAGNESIUM SULFATE 2 GM/50ML IV SOLN
2.0000 g | Freq: Once | INTRAVENOUS | Status: AC
  Administered 2016-11-12: 2 g via INTRAVENOUS
  Filled 2016-11-12: qty 50

## 2016-11-12 MED ORDER — HEPARIN SODIUM (PORCINE) 5000 UNIT/ML IJ SOLN
5000.0000 [IU] | Freq: Three times a day (TID) | INTRAMUSCULAR | Status: DC
  Administered 2016-11-12 – 2016-11-13 (×5): 5000 [IU] via SUBCUTANEOUS
  Filled 2016-11-12 (×5): qty 1

## 2016-11-12 MED ORDER — GABAPENTIN 300 MG PO CAPS
600.0000 mg | ORAL_CAPSULE | Freq: Three times a day (TID) | ORAL | Status: DC
  Administered 2016-11-12 – 2016-11-14 (×6): 600 mg via ORAL
  Filled 2016-11-12 (×6): qty 2

## 2016-11-12 MED ORDER — FAMOTIDINE IN NACL 20-0.9 MG/50ML-% IV SOLN
20.0000 mg | Freq: Two times a day (BID) | INTRAVENOUS | Status: DC
  Administered 2016-11-12 – 2016-11-13 (×3): 20 mg via INTRAVENOUS
  Filled 2016-11-12 (×6): qty 50

## 2016-11-12 MED ORDER — CITALOPRAM HYDROBROMIDE 20 MG PO TABS
20.0000 mg | ORAL_TABLET | Freq: Every day | ORAL | Status: DC
  Administered 2016-11-12 – 2016-11-14 (×3): 20 mg via ORAL
  Filled 2016-11-12 (×3): qty 1

## 2016-11-12 NOTE — Progress Notes (Signed)
[employer] assessment in progress,positive ETOH,pain management in progress,normal sinus rhythm,close monitoring in progress

## 2016-11-12 NOTE — ED Notes (Signed)

## 2016-11-12 NOTE — ED Triage Notes (Signed)
He arrives today via [facility] with reports of ETOH withdrawal - he reports his last drink was yesterday  Pt reports DT's  "I fell like I am shaking all over"   HR elevated at 150bpm per EMS  BP 140/93

## 2016-11-12 NOTE — ED Notes (Signed)
MD, Derrill KayGoodman at bedside.

## 2016-11-12 NOTE — ED Notes (Signed)
ST after adenonsine

## 2016-11-12 NOTE — ED Notes (Signed)
Pt c/o shakiness. RN, Helmut Musterlicia has been notified.

## 2016-11-12 NOTE — ED Provider Notes (Signed)
[facility] Emergency Department Provider Note   ____________________________________________   I have reviewed the triage vital signs and the nursing notes.   HISTORY  Chief Complaint Alcohol withdrawal  History limited by: Not Limited   HPI Kurt Horton is a 56 y.o. male who presents to the emergency department today because of concerns for side effects of alcohol withdrawal. The patient does have history of chronic pain and appears he has been drinking alcohol weight help manage. States he last drank yesterday. States he has had problems with withdrawal in the past however denies any seizures. He denies any hallucinations. He does have tremors.   Past Medical History:  Diagnosis Date  . Alcohol withdrawal (HCC) 11/12/2016  . Diabetes mellitus without complication (HCC)   . DJD (degenerative joint disease)   . Hypertension   . Myasthenia gravis (HCC)   . Renal disorder     Patient Active Problem List   Diagnosis Date Noted  . Lumbar radiculopathy 08/24/2016  . Depression with anxiety 08/24/2016  . Chronic low back pain 01/04/2016  . Pressure ulcer 12/29/2015  . Osteomyelitis (HCC) 12/26/2015  . PVD (peripheral vascular disease) (HCC) 12/26/2015  . Type II diabetes mellitus with manifestations (HCC) 12/26/2015  . Myasthenia gravis (HCC) 12/26/2015    Past Surgical History:  Procedure Laterality Date  . AMPUTATION TOE Left 12/29/2015   Procedure: AMPUTATION TOE;  Surgeon: Gwyneth Revels, DPM;  Location: [facility] ORS;  Service: Podiatry;  Laterality: Left;  . PERIPHERAL VASCULAR CATHETERIZATION N/A 12/27/2015   Procedure: Lower Extremity Angiography;  Surgeon: Annice Needy, MD;  Location: [facility] INVASIVE CV LAB;  Service: Cardiovascular;  Laterality: N/A;    Prior to Admission medications   Medication Sig Start Date End Date Taking? Authorizing Provider  ALPRAZolam Prudy Feeler) 0.5 MG tablet Take 1 or 2 tablets 30 min. prior to MRI.  You must not drink  alcohol, drive, or operate dangerous equipment after taking this medication. 08/30/16   Sater, Pearletha Furl, MD  b complex vitamins tablet Take 1 tablet by mouth daily.    [provider]  citalopram (CELEXA) 20 MG tablet Take 1 tablet (20 mg total) by mouth daily. 08/24/16   Sater, Pearletha Furl, MD  clonazePAM (KLONOPIN) 0.5 MG tablet Take 1 tablet (0.5 mg total) by mouth 2 (two) times daily as needed for anxiety. Patient not taking: Reported on 08/24/2016 12/30/15   Alford Highland, MD  fentaNYL (DURAGESIC - DOSED MCG/HR) 50 MCG/HR Place 1 patch (50 mcg total) onto the skin every 3 (three) days. Patient not taking: Reported on 08/24/2016 01/04/16   Asa Lente, MD  furosemide (LASIX) 40 MG tablet Take 40 mg by mouth daily as needed for fluid (if taking prednisone).     [provider]  gabapentin (NEURONTIN) 300 MG capsule Take 2 capsules (600 mg total) by mouth 3 (three) times daily. 08/24/16   Sater, Pearletha Furl, MD  levofloxacin (LEVAQUIN) 500 MG tablet Take 1 tablet (500 mg total) by mouth daily. Patient not taking: Reported on 08/24/2016 12/30/15   Alford Highland, MD  lidocaine-prilocaine (EMLA) cream Apply 1 application topically as needed. Apply to feet twice daily.    [provider]  lisinopril (PRINIVIL,ZESTRIL) 10 MG tablet Take 1 tablet (10 mg total) by mouth daily. 12/30/15   Alford Highland, MD  metFORMIN (GLUCOPHAGE) 500 MG tablet Take 1 tablet (500 mg total) by mouth daily. 12/30/15   Alford Highland, MD  mycophenolate (CELLCEPT) 500 MG tablet Take one pill in  the morning and two pills at night. 01/04/16   Sater, Pearletha Furl, MD  oxyCODONE (OXY IR/ROXICODONE) 5 MG immediate release tablet Take 1 tablet (5 mg total) by mouth every 8 (eight) hours as needed. 10/11/16   Sater, Pearletha Furl, MD  SUMAtriptan (IMITREX) 100 MG tablet Take 1 tablet (100 mg total) by mouth every 2 (two) hours as needed for migraine. May repeat in 2 hours if headache persists or recurs. Patient not  taking: Reported on 08/24/2016 12/30/15   Alford Highland, MD  zolpidem (AMBIEN) 10 MG tablet Take 1 tablet (10 mg total) by mouth at bedtime as needed for sleep. 12/30/15   Alford Highland, MD    Allergies Other  Family History  Problem Relation Age of Onset  . Rheum arthritis Father     Social History Social History  Substance Use Topics  . Smoking status: Never Smoker  . Smokeless tobacco: Never Used  . Alcohol use Yes    Review of Systems Constitutional: No fever/chills Eyes: No visual changes. ENT: No sore throat. Cardiovascular: Denies chest pain. Respiratory: Denies shortness of breath. Gastrointestinal: No abdominal pain.  No nausea, no vomiting.  No diarrhea.   Genitourinary: Negative for dysuria. Musculoskeletal: Negative for back pain. Skin: Negative for rash. Neurological: Positive for tremors.   ____________________________________________   PHYSICAL EXAM:  VITAL SIGNS: ED Triage Vitals  Enc Vitals Group     BP 11/12/16 1036 (!) 158/106     Pulse Rate 11/12/16 1033 (!) 102     Resp 11/12/16 1036 17     Temp 11/12/16 1036 97.8 F (36.6 C)     Temp Source 11/12/16 1036 Oral     SpO2 11/12/16 1036 98 %     Weight 11/12/16 1024 195 lb (88.5 kg)     Height 11/12/16 1024 5\' 11"  (1.803 m)     Head Circumference --      Peak Flow --      Pain Score 11/12/16 1021 9    Constitutional: Alert and oriented. Well appearing and in no distress. Eyes: Conjunctivae are normal.  ENT   Head: Normocephalic and atraumatic.   Nose: No congestion/rhinnorhea.   Mouth/Throat: Mucous membranes are moist.   Neck: No stridor. Hematological/Lymphatic/Immunilogical: No cervical lymphadenopathy. Cardiovascular: Tachycardic, regular rhythm.  No murmurs, rubs, or gallops. Respiratory: Normal respiratory effort without tachypnea nor retractions. Breath sounds are clear and equal bilaterally. No wheezes/rales/rhonchi. Gastrointestinal: Soft and non tender. No  rebound. No guarding.  Genitourinary: Deferred Musculoskeletal: Normal range of motion in all extremities. No lower extremity edema. Neurologic:  Normal speech and language. No gross focal neurologic deficits are appreciated.  Skin:  Skin is warm, dry and intact. No rash noted. Psychiatric: Mood and affect are normal. Speech and behavior are normal. Patient exhibits appropriate insight and judgment.  ____________________________________________    LABS (pertinent positives/negatives)  Labs Reviewed  COMPREHENSIVE METABOLIC PANEL - Abnormal; Notable for the following:       Result Value   Sodium 133 (*)    Potassium 3.0 (*)    Chloride 94 (*)    Glucose, Bld 204 (*)    Calcium 8.8 (*)    Total Protein 6.0 (*)    AST 74 (*)    All other components within normal limits  ETHANOL - Abnormal; Notable for the following:    Alcohol, Ethyl (B) 53 (*)    All other components within normal limits  CBC - Abnormal; Notable for the following:    RBC 3.27 (*)  Hemoglobin 11.4 (*)    HCT 32.5 (*)    MCH 34.9 (*)    RDW 15.2 (*)    All other components within normal limits  URINE DRUG SCREEN, QUALITATIVE ([facility] ONLY) - Abnormal; Notable for the following:    Benzodiazepine, Ur Scrn POSITIVE (*)    All other components within normal limits  MAGNESIUM - Abnormal; Notable for the following:    Magnesium 1.4 (*)    All other components within normal limits     ____________________________________________   EKG  I, Phineas SemenGraydon Dijon Cosens, attending physician, personally viewed and interpreted this EKG  EKG Time: 1020 Rate: 152 Rhythm: SVT Axis: left axis deviation Intervals: qtc 527 QRS: LAFB ST changes: no st elevation Impression: abnormal ekg  I, Phineas SemenGraydon Waver Dibiasio, attending physician, personally viewed and interpreted this EKG  EKG Time: 1033 Rate: 112 Rhythm: sinus tachycardia Axis: left axis deviation Intervals: qtc 455 QRS: LAFB ST changes: no st elevation Impression:  abnormal ekg   ____________________________________________    RADIOLOGY   [facility]One  ____________________________________________   PROCEDURES  Procedures  ____CRITICAL CARE Performed by: Phineas SemenGOODMAN, Dorothyann Mourer   Total critical care time: 30 minutes  Critical care time was exclusive of separately billable procedures and treating other patients.  Critical care was necessary to treat or prevent imminent or life-threatening deterioration.  Critical care was time spent personally by me on the following activities: development of treatment plan with patient and/or surrogate as well as nursing, discussions with consultants, evaluation of patient's response to treatment, examination of patient, obtaining history from patient or surrogate, ordering and performing treatments and interventions, ordering and review of laboratory studies, ordering and review of radiographic studies, pulse oximetry and re-evaluation of patient's condition.________________________________________   INITIAL IMPRESSION / ASSESSMENT AND PLAN / ED COURSE  Pertinent labs & imaging results that were available during my care of the patient were reviewed by me and considered in my medical decision making (see chart for details).  Patient presented to the emergency department today because of concerns for alcohol withdrawal. The patient was found to be in SVT initially. The patient did break with 6 of adenosine. Heart rate remained slightly tachycardic. The patient was given IV fluids. He did go back into SVT. He did require 12 of the [place] this time. Given repeat SVT will plan on admission to the hospital service.  ____________________________________________   FINAL CLINICAL IMPRESSION(S) / ED DIAGNOSES  Final diagnoses:  Alcohol withdrawal syndrome with complication (HCC)  SVT (supraventricular tachycardia) (HCC)     Note: This dictation was prepared with Dragon dictation. Any transcriptional errors that result  from this process are unintentional     Phineas SemenGoodman, Elasha Tess, MD 11/12/16 1506

## 2016-11-12 NOTE — H&P (Signed)
History and Physical    Kurt PhenesJohn D Asch ZOX:[id number] DOB: [birth year] DOA: 11/12/2016  Referring physician: Dr. Derrill KayGoodman PCP: [facility]  Specialists: none  Chief Complaint: back pain  HPI: Kurt Horton is a 56 y.o. male has a past medical history significant for chronic pain, HTN, DM, and myesthenia gravis who presents to ER with severe back pain. Has beein drinking ETOH to dull the pain. In ER, pt had recurrent bouts of SVT requiring Adenocard. BP is elevated. Pt appears to be in alcohol withdrawal. He is now admitted. The pt is tremulous and lethargic. Denies Cp or SOB  Review of Systems: The patient denies anorexia, fever, weight loss,, vision loss, decreased hearing, hoarseness, chest pain, syncope, dyspnea on exertion, peripheral edema, balance deficits, hemoptysis, abdominal pain, melena, hematochezia, severe indigestion/heartburn, hematuria, incontinence, genital sores, muscle weakness, suspicious skin lesions, transient blindness, difficulty walking, depression, unusual weight change, abnormal bleeding, enlarged lymph nodes, angioedema, and breast masses.   Past Medical History:  Diagnosis Date  . Alcohol withdrawal (HCC) 11/12/2016  . Diabetes mellitus without complication (HCC)   . DJD (degenerative joint disease)   . Hypertension   . Myasthenia gravis (HCC)   . Renal disorder    Past Surgical History:  Procedure Laterality Date  . AMPUTATION TOE Left 12/29/2015   Procedure: AMPUTATION TOE;  Surgeon: Gwyneth RevelsJustin Fowler, DPM;  Location: [facility] ORS;  Service: Podiatry;  Laterality: Left;  . PERIPHERAL VASCULAR CATHETERIZATION N/A 12/27/2015   Procedure: Lower Extremity Angiography;  Surgeon: Annice NeedyJason S Dew, MD;  Location: [facility] INVASIVE [facility];  Service: Cardiovascular;  Laterality: N/A;   Social History:  reports that he has never smoked. He has never used smokeless tobacco. He reports that he drinks alcohol. His drug history is not on file.  Allergies  Allergen Reactions  .  Other Other (See Comments)    Patient states he can't take any "mycin" medications because myasthenia gravis.    Family History  Problem Relation Age of Onset  . Rheum arthritis Father     Prior to Admission medications   Medication Sig Start Date End Date Taking? Authorizing Provider  citalopram (CELEXA) 20 MG tablet Take 1 tablet (20 mg total) by mouth daily. 08/24/16  Yes Sater, Pearletha Furlichard A, MD  furosemide (LASIX) 40 MG tablet Take 40 mg by mouth daily as needed for fluid (if taking prednisone).    Yes [provider]  gabapentin (NEURONTIN) 300 MG capsule Take 2 capsules (600 mg total) by mouth 3 (three) times daily. 08/24/16  Yes Sater, Pearletha Furlichard A, MD  lidocaine-prilocaine (EMLA) cream Apply 1 application topically as needed. Apply to feet twice daily.   Yes [provider]  lisinopril (PRINIVIL,ZESTRIL) 10 MG tablet Take 1 tablet (10 mg total) by mouth daily. 12/30/15  Yes Wieting, Richard, MD  metFORMIN (GLUCOPHAGE) 500 MG tablet Take 1 tablet (500 mg total) by mouth daily. 12/30/15  Yes Alford HighlandWieting, Richard, MD  mycophenolate (CELLCEPT) 500 MG tablet Take one pill in the morning and two pills at night. 01/04/16  Yes Sater, Pearletha Furlichard A, MD  oxyCODONE (OXY IR/ROXICODONE) 5 MG immediate release tablet Take 1 tablet (5 mg total) by mouth every 8 (eight) hours as needed. 10/11/16  Yes Sater, Pearletha Furlichard A, MD  ALPRAZolam Prudy Feeler(XANAX) 0.5 MG tablet Take 1 or 2 tablets 30 min. prior to MRI.  You must not drink alcohol, drive, or operate dangerous equipment after taking this medication. Patient not taking: Reported on 11/12/2016 08/30/16   Asa LenteSater, Richard A, MD  clonazePAM (KLONOPIN) 0.5 MG tablet Take 1 tablet (0.5 mg total) by mouth 2 (two) times daily as needed for anxiety. Patient not taking: Reported on 08/24/2016 12/30/15   Alford Highland, MD  fentaNYL (DURAGESIC - DOSED MCG/HR) 50 MCG/HR Place 1 patch (50 mcg total) onto the skin every 3 (three) days. Patient not taking: Reported on 08/24/2016  01/04/16   Sater, Pearletha Furl, MD  SUMAtriptan (IMITREX) 100 MG tablet Take 1 tablet (100 mg total) by mouth every 2 (two) hours as needed for migraine. May repeat in 2 hours if headache persists or recurs. Patient not taking: Reported on 08/24/2016 12/30/15   Alford Highland, MD  zolpidem (AMBIEN) 10 MG tablet Take 1 tablet (10 mg total) by mouth at bedtime as needed for sleep. Patient not taking: Reported on 11/12/2016 12/30/15   Alford Highland, MD   Physical Exam: Vitals:   11/12/16 1033 11/12/16 1036 11/12/16 1230 11/12/16 1235  BP:  (!) 158/106 (!) 168/106 (!) 168/106  Pulse: (!) 102 (!) 156 (!) 146 (!) 108  Resp:  17 18   Temp:  97.8 F (36.6 C)    TempSrc:  Oral    SpO2:  98% 99%   Weight:      Height:         General:  WDWN, [place]/AT, acutely ill appearing  Eyes: PERRL, EOMI, no scleral icterus, conjunctiva clear  ENT: moist oropharynx without exudate. TM's benign, dentition fair  Neck: supple, no lymphadenopathy. No bruits or thyromegaly  Cardiovascular: rapid rate with regular rhythm without MRG; 2+ peripheral pulses, no JVD, no peripheral edema  Respiratory: CTA biL, good air movement without wheezing, rhonchi or crackled. Respiratory effort normal  Abdomen: soft, non tender to palpation, positive bowel sounds, no guarding, no rebound  Skin: no rashes or lesions  Musculoskeletal: normal bulk and tone, no joint swelling  Psychiatric: normal mood and affect, A&OX3  Neurologic: CN 2-12 grossly intact, Motor strength 5/5 in all 4 groups with symmetric DTR's and non-focal sensory exam  Labs on Admission:  Basic Metabolic Panel:  Recent Labs Lab 11/11/16 1928 11/12/16 1024  NA 139 133*  K 2.8* 3.0*  CL 99* 94*  CO2 27 26  GLUCOSE 154* 204*  BUN 9 11  CREATININE 0.73 0.65  CALCIUM 8.6* 8.8*   Liver Function Tests:  Recent Labs Lab 11/12/16 1024  AST 74*  ALT 42  ALKPHOS 72  BILITOT 0.9  PROT 6.0*  ALBUMIN 3.5   No results for input(s): LIPASE,  AMYLASE in the last 168 hours. No results for input(s): AMMONIA in the last 168 hours. CBC:  Recent Labs Lab 11/11/16 1928 11/12/16 1024  WBC 5.4 5.4  HGB 12.3* 11.4*  HCT 36.2* 32.5*  MCV 100.2* 99.4  PLT 217 189   Cardiac Enzymes: No results for input(s): CKTOTAL, CKMB, CKMBINDEX, TROPONINI in the last 168 hours.  BNP (last 3 results) No results for input(s): BNP in the last 8760 hours.  ProBNP (last 3 results) No results for input(s): PROBNP in the last 8760 hours.  CBG: No results for input(s): GLUCAP in the last 168 hours.  Radiological Exams on Admission: No results found.  EKG: Independently reviewed.  Assessment/Plan Principal Problem:   SVT (supraventricular tachycardia) (HCC) Active Problems:   Type II diabetes mellitus with manifestations (HCC)   HTN (hypertension)   EtOH dependence (HCC)   Will observe on telemetry and begin po Diltiazem and po lopressor. Consult Cardiology and Psych. CIWA protocol for withdrawal. Replace K+ and give  empiric IV magnesium. Repeat labs in AM. IV fluids ordered  Diet: clear liquids Fluids: NS with K+@100  DVT Prophylaxis: SQ Heparin  Code Status: FULl  Family Communication: none  Disposition Plan: home  Time spent: 50 min

## 2016-11-13 ENCOUNTER — Inpatient Hospital Stay: Admit: 2016-11-13 | Payer: Medicaid Other

## 2016-11-13 DIAGNOSIS — M1990 Unspecified osteoarthritis, unspecified site: Secondary | ICD-10-CM | POA: Diagnosis present

## 2016-11-13 DIAGNOSIS — F10239 Alcohol dependence with withdrawal, unspecified: Secondary | ICD-10-CM | POA: Diagnosis present

## 2016-11-13 DIAGNOSIS — M549 Dorsalgia, unspecified: Secondary | ICD-10-CM | POA: Diagnosis present

## 2016-11-13 DIAGNOSIS — F10231 Alcohol dependence with withdrawal delirium: Secondary | ICD-10-CM | POA: Diagnosis present

## 2016-11-13 DIAGNOSIS — G8929 Other chronic pain: Secondary | ICD-10-CM | POA: Diagnosis present

## 2016-11-13 DIAGNOSIS — F1994 Other psychoactive substance use, unspecified with psychoactive substance-induced mood disorder: Secondary | ICD-10-CM | POA: Diagnosis present

## 2016-11-13 DIAGNOSIS — I10 Essential (primary) hypertension: Secondary | ICD-10-CM | POA: Diagnosis present

## 2016-11-13 DIAGNOSIS — F10280 Alcohol dependence with alcohol-induced anxiety disorder: Secondary | ICD-10-CM | POA: Diagnosis not present

## 2016-11-13 DIAGNOSIS — F329 Major depressive disorder, single episode, unspecified: Secondary | ICD-10-CM | POA: Diagnosis present

## 2016-11-13 DIAGNOSIS — Z881 Allergy status to other antibiotic agents status: Secondary | ICD-10-CM | POA: Diagnosis not present

## 2016-11-13 DIAGNOSIS — Z7984 Long term (current) use of oral hypoglycemic drugs: Secondary | ICD-10-CM | POA: Diagnosis not present

## 2016-11-13 DIAGNOSIS — F10939 Alcohol use, unspecified with withdrawal, unspecified: Secondary | ICD-10-CM | POA: Diagnosis present

## 2016-11-13 DIAGNOSIS — I471 Supraventricular tachycardia: Secondary | ICD-10-CM | POA: Diagnosis present

## 2016-11-13 DIAGNOSIS — Z79899 Other long term (current) drug therapy: Secondary | ICD-10-CM | POA: Diagnosis not present

## 2016-11-13 DIAGNOSIS — E118 Type 2 diabetes mellitus with unspecified complications: Secondary | ICD-10-CM | POA: Diagnosis present

## 2016-11-13 DIAGNOSIS — F419 Anxiety disorder, unspecified: Secondary | ICD-10-CM | POA: Diagnosis present

## 2016-11-13 DIAGNOSIS — G7000 Myasthenia gravis without (acute) exacerbation: Secondary | ICD-10-CM | POA: Diagnosis present

## 2016-11-13 LAB — COMPREHENSIVE METABOLIC PANEL
ALBUMIN: 3.2 g/dL — AB (ref 3.5–5.0)
ALK PHOS: 54 U/L (ref 38–126)
ALT: 31 U/L (ref 17–63)
AST: 46 U/L — AB (ref 15–41)
Anion gap: 5 (ref 5–15)
BUN: 10 mg/dL (ref 6–20)
CALCIUM: 8.4 mg/dL — AB (ref 8.9–10.3)
CO2: 28 mmol/L (ref 22–32)
CREATININE: 0.66 mg/dL (ref 0.61–1.24)
Chloride: 105 mmol/L (ref 101–111)
GFR calc Af Amer: >60 mL/min (ref >60)
GFR calc non Af Amer: >60 mL/min (ref >60)
GLUCOSE: 114 mg/dL — AB (ref 65–99)
Potassium: 3.6 mmol/L (ref 3.5–5.1)
SODIUM: 138 mmol/L (ref 135–145)
Total Bilirubin: 1 mg/dL (ref 0.3–1.2)
Total Protein: 5.5 g/dL — ABNORMAL LOW (ref 6.5–8.1)

## 2016-11-13 LAB — GLUCOSE, CAPILLARY
GLUCOSE-CAPILLARY: 118 mg/dL — AB (ref 65–99)
GLUCOSE-CAPILLARY: 121 mg/dL — AB (ref 65–99)
Glucose-Capillary: 124 mg/dL — ABNORMAL HIGH (ref 65–99)
Glucose-Capillary: 148 mg/dL — ABNORMAL HIGH (ref 65–99)

## 2016-11-13 LAB — C DIFFICILE QUICK SCREEN W PCR REFLEX
C DIFFICLE (CDIFF) ANTIGEN: NEGATIVE
C Diff interpretation: NOT DETECTED
C Diff toxin: NEGATIVE

## 2016-11-13 LAB — CBC
HCT: 30.1 % — ABNORMAL LOW (ref 40.0–52.0)
Hemoglobin: 10.3 g/dL — ABNORMAL LOW (ref 13.0–18.0)
MCH: 35.2 pg — AB (ref 26.0–34.0)
MCHC: 34.1 g/dL (ref 32.0–36.0)
MCV: 103.1 fL — ABNORMAL HIGH (ref 80.0–100.0)
PLATELETS: 164 10*3/uL (ref 150–440)
RBC: 2.92 MIL/uL — ABNORMAL LOW (ref 4.40–5.90)
RDW: 15.2 % — ABNORMAL HIGH (ref 11.5–14.5)
WBC: 4.2 10*3/uL (ref 3.8–10.6)

## 2016-11-13 MED ORDER — OXYCODONE HCL 5 MG PO TABS
10.0000 mg | ORAL_TABLET | Freq: Four times a day (QID) | ORAL | Status: DC | PRN
  Administered 2016-11-13 – 2016-11-14 (×4): 10 mg via ORAL
  Filled 2016-11-13 (×5): qty 2

## 2016-11-13 MED ORDER — POTASSIUM CHLORIDE CRYS ER 20 MEQ PO TBCR
40.0000 meq | EXTENDED_RELEASE_TABLET | Freq: Once | ORAL | Status: AC
  Administered 2016-11-13: 40 meq via ORAL
  Filled 2016-11-13: qty 2

## 2016-11-13 NOTE — Consult Note (Signed)
[place] [facility]iat[facility] Consult   Reason for Consult:  Consult for 56 year old man brought into the hospital with what appears to be largely alcohol withdrawal. Consult for "anxiety" Referring Physician:  Manuella Ghazi Patient Identification: Kurt Horton MRN:  [id number] Principal Diagnosis: EtOH dependence [facility]) Diagnosis:   Patient Active Problem List   Diagnosis Date Noted  . Alcohol withdrawal ([place]) [F10.239] 11/13/2016  . Substance induced mood disorder ([place]) [F19.94] 11/13/2016  . SVT (supraventricular tachycardia) ([place]) [I47.1] 11/12/2016  . HTN (hypertension) [I10] 11/12/2016  . EtOH dependence ([place]) [F10.20] 11/12/2016  . Lumbar radiculopathy [M54.16] 08/24/2016  . Depression with anxiety [F41.8] 08/24/2016  . Chronic low back pain [M54.5, G89.29] 01/04/2016  . Pressure ulcer [L89.90] 12/29/2015  . Osteomyelitis ([place]) [M86.9] 12/26/2015  . PVD (peripheral vascular disease) ([place]) [I73.9] 12/26/2015  . Type II diabetes mellitus with manifestations ([place]) [E11.8] 12/26/2015  . Myasthenia gravis ([place]) [G70.00] 12/26/2015    Total Time spent with patient: 1 hour  Subjective:   Kurt Horton is a 56 y.o. male patient admitted with "I've been drinking pretty heavily lately".  HPI:  Patient interviewed chart reviewed. 56 year old man presented to the emergency room with anxiety tachycardia symptoms of alcohol withdrawal. Blood alcohol level on presentation well over 300. Patient reports that he's been drinking heavily recently. I ask him several times to be clear about how much and I could never pin him down. He says he's been drinking for years. Has tried to stop. Sounds like he's only been able to go for a couple weeks at a time without drinking. Recently apparently had another relapse and he relates most of them to his pain. Patient brings back a lot of his complaints to his chronic back pain. Apparently he had an injury to his back some time in the past and has had chronic pain since  then. He tells me that his chief concern in life is to get his back "taking care of". He admits however that doctors have told him that there is nothing surgical but can be done for it. It sounds like he's been trying to find someone to prescribe him regular pain medicines routinely and has struggled to find somebody who will give him as much as he wants. Patient denies any history of suicide attempts denies psychiatric hospitalization. He has been prescribed antidepressant and antianxiety medicines in the past. No history of mania.  Social history: Patient had been living in [place] until little over a year ago. Apparently he had had a fairly good job but lost his job after the back injury and then lost his wife. He moved from his home appear to [place] to be with his parents. The story sounds to me like something where there is a lot more to it than what he is admitting. Currently not working seems pretty dependent.  Medical history: Overweight. Has myasthenia gravis as well.  Substance abuse history: Patient admits there've been times in his life when he was drinking as much as a 750 mL bottle of liquor a day. His elevated alcohol level when he presented suggests that he could still be drinking almost that much. He denies having any history of alcohol withdrawal seizures and denies anything that sounds like delirium tremens. Unclear if he's ever really engaged in substance abuse treatment. It sounds like he blames a lot of his substance abuse on his back problems which certainly is going to stand in the way of his getting sober.  Past Psychiatric History: No history  of past psychiatric hospitalization no history of suicide attempts. Only past medicine he remembers was clonazepam Xanax and Celexa  Risk to Self: Is patient at risk for suicide?: No Risk to Others:   Prior Inpatient Therapy:   Prior Outpatient Therapy:    Past Medical History:  Past Medical History:  Diagnosis Date  .  Alcohol withdrawal ([place]) 11/12/2016  . Diabetes mellitus without complication ([place])   . DJD (degenerative joint disease)   . Hypertension   . Myasthenia gravis ([place])   . Renal disorder     Past Surgical History:  Procedure Laterality Date  . AMPUTATION TOE Left 12/29/2015   Procedure: AMPUTATION TOE;  Surgeon: Samara Deist, DPM;  Location: [facility] ORS;  Service: Podiatry;  Laterality: Left;  . PERIPHERAL VASCULAR CATHETERIZATION N/A 12/27/2015   Procedure: Lower Extremity Angiography;  Surgeon: Algernon Huxley, MD;  Location: [place] [facility];  Service: Cardiovascular;  Laterality: N/A;   Family History:  Family History  Problem Relation Age of Onset  . Rheum arthritis Father    Family Psychiatric  History: Family history of alcohol use Social History:  History  Alcohol Use  . Yes     History  Drug use: Unknown    Social History   Social History  . Marital status: Legally Separated    Spouse name: N/A  . Number of children: N/A  . Years of education: N/A   Social History Main Topics  . Smoking status: Never Smoker  . Smokeless tobacco: Never Used  . Alcohol use Yes  . Drug use: Unknown  . Sexual activity: Not Asked   Other Topics Concern  . None   Social History Narrative  . None   Additional Social History:    Allergies:   Allergies  Allergen Reactions  . Other Other (See Comments)    Patient states he can't take any "mycin" medications because myasthenia gravis.    Labs:  Results for orders placed or performed during the hospital encounter of 11/12/16 (from the past 48 hour(s))  Comprehensive metabolic panel     Status: Abnormal   Collection Time: 11/12/16 10:24 AM  Result Value Ref Range   Sodium 133 (L) 135 - 145 mmol/L   Potassium 3.0 (L) 3.5 - 5.1 mmol/L   Chloride 94 (L) 101 - 111 mmol/L   CO2 26 22 - 32 mmol/L   Glucose, Bld 204 (H) 65 - 99 mg/dL   BUN 11 6 - 20 mg/dL   Creatinine, Ser 0.65 0.61 - 1.24 mg/dL   Calcium 8.8 (L) 8.9 - 10.3 mg/dL    Total Protein 6.0 (L) 6.5 - 8.1 g/dL   Albumin 3.5 3.5 - 5.0 g/dL   AST 74 (H) 15 - 41 U/L   ALT 42 17 - 63 U/L   Alkaline Phosphatase 72 38 - 126 U/L   Total Bilirubin 0.9 0.3 - 1.2 mg/dL   GFR calc non Af Amer >60 >60 mL/min   GFR calc Af Amer >60 >60 mL/min    Comment: (NOTE) The eGFR has been calculated using the CKD EPI equation. This calculation has not been validated in all clinical situations. eGFR's persistently <60 mL/min signify possible Chronic Kidney Disease.    Anion gap 13 5 - 15  Ethanol     Status: Abnormal   Collection Time: 11/12/16 10:24 AM  Result Value Ref Range   Alcohol, Ethyl (B) 53 (H) <5 mg/dL    Comment:        LOWEST DETECTABLE  LIMIT FOR SERUM ALCOHOL IS 5 mg/dL FOR MEDICAL PURPOSES ONLY   cbc     Status: Abnormal   Collection Time: 11/12/16 10:24 AM  Result Value Ref Range   WBC 5.4 3.8 - 10.6 K/uL   RBC 3.27 (L) 4.40 - 5.90 MIL/uL   Hemoglobin 11.4 (L) 13.0 - 18.0 g/dL   HCT 32.5 (L) 40.0 - 52.0 %   MCV 99.4 80.0 - 100.0 fL   MCH 34.9 (H) 26.0 - 34.0 pg   MCHC 35.1 32.0 - 36.0 g/dL   RDW 15.2 (H) 11.5 - 14.5 %   Platelets 189 150 - 440 K/uL  Magnesium     Status: Abnormal   Collection Time: 11/12/16 10:24 AM  Result Value Ref Range   Magnesium 1.4 (L) 1.7 - 2.4 mg/dL  TSH     Status: None   Collection Time: 11/12/16 10:24 AM  Result Value Ref Range   TSH 0.989 0.350 - 4.500 uIU/mL    Comment: Performed by a 3rd Generation assay with a functional sensitivity of <=0.01 uIU/mL.  Urine Drug Screen, Qualitative     Status: Abnormal   Collection Time: 11/12/16  1:49 PM  Result Value Ref Range   Tricyclic, Ur Screen NONE DETECTED NONE DETECTED   Amphetamines, Ur Screen NONE DETECTED NONE DETECTED   MDMA (Ecstasy)Ur Screen NONE DETECTED NONE DETECTED   Cocaine Metabolite,Ur [place] NONE DETECTED NONE DETECTED   Opiate, Ur Screen NONE DETECTED NONE DETECTED   Phencyclidine (PCP) Ur S NONE DETECTED NONE DETECTED   Cannabinoid 50 Ng, Ur [place] NONE  DETECTED NONE DETECTED   Barbiturates, Ur Screen NONE DETECTED NONE DETECTED   Benzodiazepine, Ur Scrn POSITIVE (A) NONE DETECTED   Methadone Scn, Ur NONE DETECTED NONE DETECTED    Comment: (NOTE) 706  Tricyclics, urine               Cutoff 1000 ng/mL 200  Amphetamines, urine             Cutoff 1000 ng/mL 300  MDMA (Ecstasy), urine           Cutoff 500 ng/mL 400  Cocaine Metabolite, urine       Cutoff 300 ng/mL 500  Opiate, urine                   Cutoff 300 ng/mL 600  Phencyclidine (PCP), urine      Cutoff 25 ng/mL 700  Cannabinoid, urine              Cutoff 50 ng/mL 800  Barbiturates, urine             Cutoff 200 ng/mL 900  Benzodiazepine, urine           Cutoff 200 ng/mL 1000 Methadone, urine                Cutoff 300 ng/mL 1100 1200 The urine drug screen provides only a preliminary, unconfirmed 1300 analytical test result and should not be used for non-medical 1400 purposes. Clinical consideration and professional judgment should 1500 be applied to any positive drug screen result due to possible 1600 interfering substances. A more specific alternate chemical method 1700 must be used in order to obtain a confirmed analytical result.  1800 Gas chromato graphy / mass spectrometry (GC/MS) is the preferred 1900 confirmatory method.   Glucose, capillary     Status: Abnormal   Collection Time: 11/12/16  5:07 PM  Result Value Ref Range   Glucose-Capillary 153 (H) 65 -  99 mg/dL  Glucose, capillary     Status: Abnormal   Collection Time: 11/12/16  9:25 PM  Result Value Ref Range   Glucose-Capillary 141 (H) 65 - 99 mg/dL  Comprehensive metabolic panel     Status: Abnormal   Collection Time: 11/13/16  7:31 AM  Result Value Ref Range   Sodium 138 135 - 145 mmol/L   Potassium 3.6 3.5 - 5.1 mmol/L   Chloride 105 101 - 111 mmol/L   CO2 28 22 - 32 mmol/L   Glucose, Bld 114 (H) 65 - 99 mg/dL   BUN 10 6 - 20 mg/dL   Creatinine, Ser 0.66 0.61 - 1.24 mg/dL   Calcium 8.4 (L) 8.9 - 10.3  mg/dL   Total Protein 5.5 (L) 6.5 - 8.1 g/dL   Albumin 3.2 (L) 3.5 - 5.0 g/dL   AST 46 (H) 15 - 41 U/L   ALT 31 17 - 63 U/L   Alkaline Phosphatase 54 38 - 126 U/L   Total Bilirubin 1.0 0.3 - 1.2 mg/dL   GFR calc non Af Amer >60 >60 mL/min   GFR calc Af Amer >60 >60 mL/min    Comment: (NOTE) The eGFR has been calculated using the CKD EPI equation. This calculation has not been validated in all clinical situations. eGFR's persistently <60 mL/min signify possible Chronic Kidney Disease.    Anion gap 5 5 - 15  CBC     Status: Abnormal   Collection Time: 11/13/16  7:31 AM  Result Value Ref Range   WBC 4.2 3.8 - 10.6 K/uL   RBC 2.92 (L) 4.40 - 5.90 MIL/uL   Hemoglobin 10.3 (L) 13.0 - 18.0 g/dL   HCT 30.1 (L) 40.0 - 52.0 %   MCV 103.1 (H) 80.0 - 100.0 fL   MCH 35.2 (H) 26.0 - 34.0 pg   MCHC 34.1 32.0 - 36.0 g/dL   RDW 15.2 (H) 11.5 - 14.5 %   Platelets 164 150 - 440 K/uL  Glucose, capillary     Status: Abnormal   Collection Time: 11/13/16  8:29 AM  Result Value Ref Range   Glucose-Capillary 124 (H) 65 - 99 mg/dL   Comment 1 Notify RN    Comment 2 Document in Chart   Glucose, capillary     Status: Abnormal   Collection Time: 11/13/16 11:56 AM  Result Value Ref Range   Glucose-Capillary 121 (H) 65 - 99 mg/dL   Comment 1 Notify RN    Comment 2 Document in Chart     Current Facility-Administered Medications  Medication Dose Route Frequency Provider Last Rate Last Dose  . 0.9 % NaCl with KCl 20 mEq/ L  infusion   Intravenous Continuous Idelle Crouch, MD 100 mL/hr at 11/13/16 0534    . acetaminophen (TYLENOL) tablet 650 mg  650 mg Oral Q6H PRN Idelle Crouch, MD       Or  . acetaminophen (TYLENOL) suppository 650 mg  650 mg Rectal Q6H PRN Idelle Crouch, MD      . bisacodyl (DULCOLAX) suppository 10 mg  10 mg Rectal Daily PRN Idelle Crouch, MD      . citalopram (CELEXA) tablet 20 mg  20 mg Oral Daily Idelle Crouch, MD   20 mg at 11/13/16 0950  . guaiFENesin  (MUCINEX) 12 hr tablet 600 mg  600 mg Oral BID Max Sane, MD   600 mg at 11/13/16 0950   And  . dextromethorphan (DELSYM) 30 MG/5ML liquid 30 mg  30 mg Oral BID Max Sane, MD   30 mg at 11/13/16 1000  . diltiazem (CARDIZEM) tablet 60 mg  60 mg Oral Q6H Idelle Crouch, MD   60 mg at 11/13/16 1345  . docusate sodium (COLACE) capsule 100 mg  100 mg Oral BID Idelle Crouch, MD      . famotidine (PEPCID) IVPB 20 mg premix  20 mg Intravenous Q12H Idelle Crouch, MD   Stopped at 11/13/16 1027  . gabapentin (NEURONTIN) capsule 600 mg  600 mg Oral TID Idelle Crouch, MD   600 mg at 11/13/16 0950  . heparin injection 5,000 Units  5,000 Units Subcutaneous Q8H Idelle Crouch, MD   5,000 Units at 11/13/16 1346  . hydrALAZINE (APRESOLINE) injection 10 mg  10 mg Intravenous Q6H PRN Idelle Crouch, MD      . insulin aspart (novoLOG) injection 0-9 Units  0-9 Units Subcutaneous TID WC Idelle Crouch, MD   1 Units at 11/13/16 0950  . lisinopril (PRINIVIL,ZESTRIL) tablet 10 mg  10 mg Oral Daily Idelle Crouch, MD   10 mg at 11/13/16 0950  . loperamide (IMODIUM) capsule 2 mg  2 mg Oral BID PRN Hugelmeyer, Alexis, DO   2 mg at 11/12/16 2334  . LORazepam (ATIVAN) injection 1 mg  1 mg Intravenous Q4H PRN Idelle Crouch, MD   1 mg at 11/13/16 1346  . metFORMIN (GLUCOPHAGE) tablet 500 mg  500 mg Oral Q supper Idelle Crouch, MD   500 mg at 11/12/16 1613  . metoprolol tartrate (LOPRESSOR) tablet 25 mg  25 mg Oral BID Idelle Crouch, MD   25 mg at 11/13/16 0957  . mycophenolate (CELLCEPT) capsule 500 mg  500 mg Oral Daily Idelle Crouch, MD   500 mg at 11/13/16 2637   And  . mycophenolate (CELLCEPT) capsule 1,000 mg  1,000 mg Oral Q2200 Idelle Crouch, MD   1,000 mg at 11/12/16 2212  . ondansetron (ZOFRAN) tablet 4 mg  4 mg Oral Q6H PRN Idelle Crouch, MD       Or  . ondansetron [facility]) injection 4 mg  4 mg Intravenous Q6H PRN Idelle Crouch, MD      . oxyCODONE (Oxy  IR/ROXICODONE) immediate release tablet 10 mg  10 mg Oral Q6H PRN Max Sane, MD   10 mg at 11/13/16 1127    Musculoskeletal: Strength & Muscle Tone: decreased Gait & Station: unsteady Patient leans: N/A  Psychiatric Specialty Exam: Physical Exam  Nursing note and vitals reviewed. Constitutional: He appears well-developed and well-nourished.  HENT:  Head: Normocephalic and atraumatic.  Eyes: Conjunctivae are normal. Pupils are equal, round, and reactive to light.  Neck: Normal range of motion.  Cardiovascular: Regular rhythm and normal heart sounds.   Respiratory: Effort normal. No respiratory distress.  GI: Soft.  Musculoskeletal: Normal range of motion.  Neurological: He is alert.  Skin: Skin is warm and dry.  Psychiatric: His mood appears anxious. His speech is delayed. He is slowed and withdrawn. Thought content is not paranoid. Cognition and memory are impaired. He expresses impulsivity. He expresses no homicidal and no suicidal ideation.    Review of Systems  Constitutional: Negative.   HENT: Negative.   Eyes: Negative.   Respiratory: Negative.   Cardiovascular: Negative.   Gastrointestinal: Negative.   Musculoskeletal: Positive for back pain.  Skin: Negative.   Neurological: Positive for tremors.  Psychiatric/Behavioral: Positive for memory loss and substance abuse. Negative for  depression, hallucinations and suicidal ideas. The patient is nervous/anxious. The patient does not have insomnia.     Blood pressure 137/77, pulse 74, temperature 98.2 F (36.8 C), temperature source Oral, resp. rate 16, height _0  (1.803 m), weight 90.4 kg (199 lb 6.4 oz), SpO2 100 %.Body mass index is 27.81 kg/m.  General Appearance: Disheveled  Eye Contact:  Fair  Speech:  Slow and Slurred  Volume:  Decreased  Mood:  Euthymic  Affect:  Constricted  Thought Process:  Disorganized  Orientation:  Full (Time, Place, and Person)  Thought Content:  Rumination and Tangential  Suicidal  Thoughts:  No  Homicidal Thoughts:  No  Memory:  Immediate;   Fair Recent;   Poor Remote;   Fair  Judgement:  Impaired  Insight:  Shallow  Psychomotor Activity:  Decreased  Concentration:  Concentration: Fair  Recall:  [employer] of Knowledge:  Fair  Language:  Fair  Akathisia:  No  Handed:  Right  AIMS (if indicated):     Assets:  Desire for Improvement Housing Social Support  ADL's:  Intact  Cognition:  Impaired,  Mild  Sleep:        Treatment Plan Summary: Daily contact with patient to assess and evaluate symptoms and progress in treatment, Medication management and Plan 56 year old man with alcohol abuse. At this point he is no longer in the window of risk for seizures and probably is at very low risk for DTs. Not having severe tremors. I don't think it's probably necessary at this point to put the CIWA scale in place. He already has been put back on Celexa which I think would be an appropriate medicine. I tried to emphasize to the patient that his primary goal should be getting sober and staying off the alcohol abuse once to treat anything else. He does tell me that he has an appointment to see Dr. Rosine Door and a therapist in the near future which I'm very glad to hear. No change to medicine. Does not require inpatient hospitalization. I will follow-up as needed.  Disposition: Patient does not meet criteria for psychiatric inpatient admission. Supportive therapy provided about ongoing stressors.  Alethia Berthold, MD 11/13/2016 1:47 PM

## 2016-11-13 NOTE — Telephone Encounter (Signed)
Called and left message for patient stating referral was declined buy [facility]. I asked patient to call me back.

## 2016-11-13 NOTE — Progress Notes (Signed)
Kurt Horton is a 56 y.o. male  [id number]  Primary Cardiologist: [name] Reason for Consultation: SVT  HPI: This is a 56 year old white male with a history of EtOH abuse presented to the hospital with back pain tremors and was found to have SVT with magnesium of 1.4.   Review of Systems: No chest pain or shortness of breath or palpitation   Past Medical History:  Diagnosis Date  . Alcohol withdrawal (HCC) 11/12/2016  . Diabetes mellitus without complication (HCC)   . DJD (degenerative joint disease)   . Hypertension   . Myasthenia gravis (HCC)   . Renal disorder     Medications Prior to Admission  Medication Sig Dispense Refill  . citalopram (CELEXA) 20 MG tablet Take 1 tablet (20 mg total) by mouth daily. 30 tablet 5  . furosemide (LASIX) 40 MG tablet Take 40 mg by mouth daily as needed for fluid (if taking prednisone).     . gabapentin (NEURONTIN) 300 MG capsule Take 2 capsules (600 mg total) by mouth 3 (three) times daily. 180 capsule 5  . lidocaine-prilocaine (EMLA) cream Apply 1 application topically as needed. Apply to feet twice daily.    Marland Kitchen lisinopril (PRINIVIL,ZESTRIL) 10 MG tablet Take 1 tablet (10 mg total) by mouth daily. 30 tablet 0  . metFORMIN (GLUCOPHAGE) 500 MG tablet Take 1 tablet (500 mg total) by mouth daily. 30 tablet 0  . mycophenolate (CELLCEPT) 500 MG tablet Take one pill in the morning and two pills at night. 90 tablet 5  . oxyCODONE (OXY IR/ROXICODONE) 5 MG immediate release tablet Take 1 tablet (5 mg total) by mouth every 8 (eight) hours as needed. 90 tablet 0  . ALPRAZolam (XANAX) 0.5 MG tablet Take 1 or 2 tablets 30 min. prior to MRI.  You must not drink alcohol, drive, or operate dangerous equipment after taking this medication. (Patient not taking: Reported on 11/12/2016) 2 tablet 0  . clonazePAM (KLONOPIN) 0.5 MG tablet Take 1 tablet (0.5 mg total) by mouth 2 (two) times daily as needed for anxiety. (Patient not taking: Reported on 08/24/2016)  30 tablet 0  . fentaNYL (DURAGESIC - DOSED MCG/HR) 50 MCG/HR Place 1 patch (50 mcg total) onto the skin every 3 (three) days. (Patient not taking: Reported on 08/24/2016) 10 patch 0  . SUMAtriptan (IMITREX) 100 MG tablet Take 1 tablet (100 mg total) by mouth every 2 (two) hours as needed for migraine. May repeat in 2 hours if headache persists or recurs. (Patient not taking: Reported on 08/24/2016) 6 tablet 0  . zolpidem (AMBIEN) 10 MG tablet Take 1 tablet (10 mg total) by mouth at bedtime as needed for sleep. (Patient not taking: Reported on 11/12/2016) 15 tablet 0     . citalopram  20 mg Oral Daily  . guaiFENesin  600 mg Oral BID   And  . dextromethorphan  30 mg Oral BID  . diltiazem  60 mg Oral Q6H  . docusate sodium  100 mg Oral BID  . gabapentin  600 mg Oral TID  . heparin  5,000 Units Subcutaneous Q8H  . insulin aspart  0-9 Units Subcutaneous TID WC  . lisinopril  10 mg Oral Daily  . metFORMIN  500 mg Oral Q supper  . metoprolol tartrate  25 mg Oral BID  . mycophenolate  500 mg Oral Daily   And  . mycophenolate  1,000 mg Oral Q2200    Infusions: . 0.9 % NaCl with KCl 20 mEq / L  100 mL/hr at 11/13/16 0534  . famotidine (PEPCID) IV Stopped (11/12/16 1925)    Allergies  Allergen Reactions  . Other Other (See Comments)    Patient states he can't take any "mycin" medications because myasthenia gravis.    Social History   Social History  . Marital status: Legally Separated    Spouse name: N/A  . Number of children: N/A  . Years of education: N/A   Occupational History  . Not on file.   Social History Main Topics  . Smoking status: Never Smoker  . Smokeless tobacco: Never Used  . Alcohol use Yes  . Drug use: Unknown  . Sexual activity: Not on file   Other Topics Concern  . Not on file   Social History Narrative  . No narrative on file    Family History  Problem Relation Age of Onset  . Rheum arthritis Father     PHYSICAL EXAM: Vitals:   11/12/16 2337  11/13/16 0359  BP: 128/71 126/71  Pulse: 75 84  Resp:  18  Temp:  98.6 F (37 C)     Intake/Output Summary (Last 24 hours) at 11/13/16 0747 Last data filed at 11/13/16 0402  Gross per 24 hour  Intake          1811.67 ml  Output              500 ml  Net          1311.67 ml    General:  Well appearing. No respiratory difficulty HEENT: normal Neck: supple. no JVD. Carotids 2+ bilat; no bruits. No lymphadenopathy or thryomegaly appreciated. Cor: PMI nondisplaced. Regular rate & rhythm. No rubs, gallops or murmurs. Lungs: clear Abdomen: soft, nontender, nondistended. No hepatosplenomegaly. No bruits or masses. Good bowel sounds. Extremities: no cyanosis, clubbing, rash, edema Neuro: alert & oriented x 3, cranial nerves grossly intact. moves all 4 extremities w/o difficulty. Affect pleasant.  ECG: EKG shows some supraventricular tachycardia on admission  Results for orders placed or performed during the hospital encounter of 11/12/16 (from the past 24 hour(s))  Comprehensive metabolic panel     Status: Abnormal   Collection Time: 11/12/16 10:24 AM  Result Value Ref Range   Sodium 133 (L) 135 - 145 mmol/L   Potassium 3.0 (L) 3.5 - 5.1 mmol/L   Chloride 94 (L) 101 - 111 mmol/L   CO2 26 22 - 32 mmol/L   Glucose, Bld 204 (H) 65 - 99 mg/dL   BUN 11 6 - 20 mg/dL   Creatinine, Ser 1.610.65 0.61 - 1.24 mg/dL   Calcium 8.8 (L) 8.9 - 10.3 mg/dL   Total Protein 6.0 (L) 6.5 - 8.1 g/dL   Albumin 3.5 3.5 - 5.0 g/dL   AST 74 (H) 15 - 41 U/L   ALT 42 17 - 63 U/L   Alkaline Phosphatase 72 38 - 126 U/L   Total Bilirubin 0.9 0.3 - 1.2 mg/dL   GFR calc non Af Amer >60 >60 mL/min   GFR calc Af Amer >60 >60 mL/min   Anion gap 13 5 - 15  Ethanol     Status: Abnormal   Collection Time: 11/12/16 10:24 AM  Result Value Ref Range   Alcohol, Ethyl (B) 53 (H) <5 mg/dL  cbc     Status: Abnormal   Collection Time: 11/12/16 10:24 AM  Result Value Ref Range   WBC 5.4 3.8 - 10.6 K/uL   RBC 3.27 (L) 4.40  - 5.90 MIL/uL   Hemoglobin  11.4 (L) 13.0 - 18.0 g/dL   HCT 96.0 (L) 45.4 - 09.8 %   MCV 99.4 80.0 - 100.0 fL   MCH 34.9 (H) 26.0 - 34.0 pg   MCHC 35.1 32.0 - 36.0 g/dL   RDW 11.9 (H) 14.7 - 82.9 %   Platelets 189 150 - 440 K/uL  Magnesium     Status: Abnormal   Collection Time: 11/12/16 10:24 AM  Result Value Ref Range   Magnesium 1.4 (L) 1.7 - 2.4 mg/dL  TSH     Status: None   Collection Time: 11/12/16 10:24 AM  Result Value Ref Range   TSH 0.989 0.350 - 4.500 uIU/mL  Urine Drug Screen, Qualitative     Status: Abnormal   Collection Time: 11/12/16  1:49 PM  Result Value Ref Range   Tricyclic, Ur Screen NONE DETECTED NONE DETECTED   Amphetamines, Ur Screen NONE DETECTED NONE DETECTED   MDMA (Ecstasy)Ur Screen NONE DETECTED NONE DETECTED   Cocaine Metabolite,Ur [place] NONE DETECTED NONE DETECTED   Opiate, Ur Screen NONE DETECTED NONE DETECTED   Phencyclidine (PCP) Ur S NONE DETECTED NONE DETECTED   Cannabinoid 50 Ng, Ur  NONE DETECTED NONE DETECTED   Barbiturates, Ur Screen NONE DETECTED NONE DETECTED   Benzodiazepine, Ur Scrn POSITIVE (A) NONE DETECTED   Methadone Scn, Ur NONE DETECTED NONE DETECTED  Glucose, capillary     Status: Abnormal   Collection Time: 11/12/16  5:07 PM  Result Value Ref Range   Glucose-Capillary 153 (H) 65 - 99 mg/dL  Glucose, capillary     Status: Abnormal   Collection Time: 11/12/16  9:25 PM  Result Value Ref Range   Glucose-Capillary 141 (H) 65 - 99 mg/dL   No results found.   ASSESSMENT AND PLAN: Status post supraventricular tachycardia responding to an inner core currently in sinus rhythm but has withdrawal from EtOH with tremors and back pain. Advise giving metoprolol to control heart rate and will get echocardiogram on the patient.  Alaze Garverick A

## 2016-11-13 NOTE — Progress Notes (Signed)
Patient has declined cpap therapy for the night. He states therapy dries out his airway really bad.  A humidifier chamber with water is setup on cpap. He states however it is not helping with dryness

## 2016-11-13 NOTE — Progress Notes (Signed)
[facility] [place] [facility]   PATIENT NAME: Kurt Horton    MR#:  [id number]  DATE OF BIRTH:  25-Apr-1961  SUBJECTIVE:  CHIEF COMPLAINT:   Chief Complaint  Patient presents with  . Delirium Tremens (DTS)  . Back Pain  . Tachycardia  Requesting to go up on his pain medication, in tears, feels very anxious and stressed as his pain is uncontrolled.  Also seems frustrated with scheduling all the appointments between pain management and psychiatry REVIEW OF SYSTEMS:  Review of Systems  Constitutional: Negative for chills, fever and weight loss.  HENT: Negative for nosebleeds and sore throat.   Eyes: Negative for blurred vision.  Respiratory: Negative for cough, shortness of breath and wheezing.   Cardiovascular: Negative for chest pain, orthopnea, leg swelling and PND.  Gastrointestinal: Negative for abdominal pain, constipation, diarrhea, heartburn, nausea and vomiting.  Genitourinary: Negative for dysuria and urgency.  Musculoskeletal: Positive for back pain.  Skin: Negative for rash.  Neurological: Negative for dizziness, speech change, focal weakness and headaches.  Endo/Heme/Allergies: Does not bruise/bleed easily.  Psychiatric/Behavioral: Positive for depression and substance abuse. The patient is nervous/anxious.     DRUG ALLERGIES:   Allergies  Allergen Reactions  . Other Other (See Comments)    Patient states he can't take any "mycin" medications because myasthenia gravis.   VITALS:  Blood pressure (!) 161/84, pulse 91, temperature 98.4 F (36.9 C), temperature source Oral, resp. rate 20, height 5\' 11"  (1.803 m), weight 90.4 kg (199 lb 6.4 oz), SpO2 97 %. PHYSICAL EXAMINATION:  Physical Exam  Constitutional: He is oriented to person, place, and time and well-developed, well-nourished, and in no distress.  HENT:  Head: Normocephalic and atraumatic.  Eyes: Conjunctivae and EOM are normal. Pupils are equal, round, and reactive to light.  Neck:  Normal range of motion. Neck supple. No tracheal deviation present. No thyromegaly present.  Cardiovascular: Normal rate, regular rhythm and normal heart sounds.   Pulmonary/Chest: Effort normal and breath sounds normal. No respiratory distress. He has no wheezes. He exhibits no tenderness.  Abdominal: Soft. Bowel sounds are normal. He exhibits no distension. There is no tenderness.  Musculoskeletal: Normal range of motion.  Neurological: He is alert and oriented to person, place, and time. No cranial nerve deficit.  Skin: Skin is warm and dry. No rash noted.  Psychiatric: His mood appears anxious. He is agitated. He expresses impulsivity. He exhibits a depressed mood. He is concerned with wish fulfillment.   LABORATORY PANEL:  Male CBC  Recent Labs Lab 11/13/16 0731  WBC 4.2  HGB 10.3*  HCT 30.1*  PLT 164   ------------------------------------------------------------------------------------------------------------------ Chemistries   Recent Labs Lab 11/12/16 1024 11/13/16 0731  NA 133* 138  K 3.0* 3.6  CL 94* 105  CO2 26 28  GLUCOSE 204* 114*  BUN 11 10  CREATININE 0.65 0.66  CALCIUM 8.8* 8.4*  MG 1.4*  --   AST 74* 46*  ALT 42 31  ALKPHOS 72 54  BILITOT 0.9 1.0   RADIOLOGY:  No results found. ASSESSMENT AND PLAN:  Kurt Horton is a 56 y.o. male has a past medical history significant for chronic pain, HTN, DM, and myesthenia gravis who presents to ER with severe back pain. Has beein drinking ETOH to dull the pain. In ER, pt had recurrent bouts of SVT requiring Adenocard. BP is elevated. Pt appears to be in alcohol withdrawal. He is now admitted. The pt is tremulous and lethargic. Denies Cp  or SOB  * SVT (supraventricular tachycardia):  - He is converted back to normal sinus rhythm - Rate is well controlled on Cardizem 60 mg by mouth every 6 hours  - Cardiology added metoprolol 25 mg oral twice a day - Pending 2-D echo - Monitor on telemetry  * Alcohol  withdrawal - He is going through a lot of stress and anxiety, makes him go back on alcohol drinking due to uncontrolled pain - Feels very frustrated for not able to get his pain better managed as an outpatient - He feels scheduling all his appointment has been very difficult and cumbersome - Continue CIWA protocol  * Anxiety/depression - Has appointment with Dr. Redmond Baseman on 7/30 and with psychotherapist on 7/17, but he feels this needs to be earlier as he needs much help - We will consult psychiatry while here  * Type II diabetes mellitus with manifestations (HCC) - Continue insulin sliding scale and metformin  * HTN (hypertension) - Continue lisinopril, Cardizem and metoprolol, hydralazine as needed  * EtOH dependence (HCC): Counseled - May benefit from AA as an outpatient     All the records are reviewed and case discussed with Care Management/Social Worker. Management plans discussed with the patient, nursing and they are in agreement.  CODE STATUS: Full Code  TOTAL TIME TAKING CARE OF THIS PATIENT: 35 minutes.   More than 50% of the time was spent in counseling/coordination of care: YES  POSSIBLE D/C IN 1-2 DAYS, DEPENDING ON CLINICAL CONDITION. And psych eval   Delfino Lovett M.D on 11/13/2016 at 10:37 AM  Between 7am to 6pm - Pager - [phone]  After 6pm go to [email or web address] - Social research officer, government  [facility]  Office  [phone]  CC: Primary care physician; Raynelle Bring  Note: This dictation was prepared with Dragon dictation along with smaller phrase technology. Any transcriptional errors that result from this process are unintentional.

## 2016-11-14 ENCOUNTER — Inpatient Hospital Stay
Admit: 2016-11-14 | Discharge: 2016-11-14 | Disposition: A | Discharge status: Home / Self Care | Payer: Medicaid Other | Attending: Cardiovascular Disease | Admitting: Cardiovascular Disease

## 2016-11-14 LAB — BASIC METABOLIC PANEL
ANION GAP: 6 (ref 5–15)
BUN: 13 mg/dL (ref 6–20)
CALCIUM: 8.7 mg/dL — AB (ref 8.9–10.3)
CHLORIDE: 103 mmol/L (ref 101–111)
CO2: 26 mmol/L (ref 22–32)
CREATININE: 0.72 mg/dL (ref 0.61–1.24)
GFR calc non Af Amer: >60 mL/min (ref >60)
Glucose, Bld: 107 mg/dL — ABNORMAL HIGH (ref 65–99)
Potassium: 3.6 mmol/L (ref 3.5–5.1)
SODIUM: 135 mmol/L (ref 135–145)

## 2016-11-14 LAB — HEMOGLOBIN A1C
Hgb A1c MFr Bld: 5.6 % (ref 4.8–5.6)
MEAN PLASMA GLUCOSE: 114 mg/dL

## 2016-11-14 LAB — ECHOCARDIOGRAM COMPLETE
Height: 71 in
Weight: 3174.4 oz

## 2016-11-14 LAB — CBC
HEMATOCRIT: 28.1 % — AB (ref 40.0–52.0)
HEMOGLOBIN: 9.6 g/dL — AB (ref 13.0–18.0)
MCH: 35.3 pg — ABNORMAL HIGH (ref 26.0–34.0)
MCHC: 34.3 g/dL (ref 32.0–36.0)
MCV: 103 fL — ABNORMAL HIGH (ref 80.0–100.0)
Platelets: 167 10*3/uL (ref 150–440)
RBC: 2.73 MIL/uL — ABNORMAL LOW (ref 4.40–5.90)
RDW: 15.3 % — ABNORMAL HIGH (ref 11.5–14.5)
WBC: 5.5 10*3/uL (ref 3.8–10.6)

## 2016-11-14 LAB — HIV ANTIBODY (ROUTINE TESTING W REFLEX): HIV SCREEN 4TH GENERATION: NONREACTIVE

## 2016-11-14 LAB — GLUCOSE, CAPILLARY: Glucose-Capillary: 135 mg/dL — ABNORMAL HIGH (ref 65–99)

## 2016-11-14 MED ORDER — OXYCODONE HCL 5 MG PO TABS
10.0000 mg | ORAL_TABLET | Freq: Once | ORAL | Status: AC
  Administered 2016-11-14: 10 mg via ORAL

## 2016-11-14 MED ORDER — DILTIAZEM HCL ER COATED BEADS 120 MG PO CP24: 120.0000 mg | ORAL_CAPSULE | Freq: Every day | ORAL | 0 refills | Status: DC

## 2016-11-14 MED ORDER — METOPROLOL TARTRATE 25 MG PO TABS: 25.0000 mg | ORAL_TABLET | Freq: Two times a day (BID) | ORAL | 0 refills | Status: DC

## 2016-11-14 NOTE — Progress Notes (Signed)
*  PRELIMINARY RESULTS* Echocardiogram 2D Echocardiogram has been performed.  [name], [name] 11/14/2016, 9:25 AM

## 2016-11-14 NOTE — Discharge Instructions (Signed)
Supraventricular Tachycardia, Adult °Supraventricular tachycardia (SVT) is a kind of abnormal heartbeat. It makes your heart beat very fast and then beat at a normal speed. °A normal heart beats 60-100 times a minute. This condition can make your heart beat more than 150 times a minute. Times of having a fast heartbeat (episodes) can be scary, but they are usually not dangerous. They can lead to problems if: °· They happen often. °· They last a long time. °Symptoms of this condition include: °· A pounding heart. °· A feeling that your heart is skipping beats (palpitations). °· Weakness. °· Trouble getting enough air (shortness of breath). °· Pain or tightness in your chest. °· Feeling like you are going to pass out (light-headedness). °· Feeling worried or nervous (anxiety). °· Dizziness. °· Sweating. °· Feeling sick to your stomach (nausea). °· Passing out (fainting). °· Tiredness. °Sometimes, there are no symptoms. °Follow these instructions at home: °Stress  °· Avoid things that make you feel stressed. °· Find out what helps you feel less stressed. Try: °¨ Doing a relaxing activity, like yoga, meditation, or being out in nature. °¨ Listening to relaxing music. °¨ Doing relaxation techniques, like deep breathing. °¨ Taking steps to be healthy. These include getting lots of sleep, exercising, and eating a balanced diet. °¨ Talking with a mental health doctor. °Sleep  °· Try to get at least 7 hours of sleep each night. °Tobacco and nicotine  °· Do not use anything that has nicotine or tobacco, such as cigarettes and e-cigarettes. If you need help quitting, ask your doctor. °Alcohol  °· If alcohol gives you a fast heartbeat, do not drink alcohol. °· If alcohol does not seem to give you a fast heartbeat, limit your alcohol. For nonpregnant women, this means no more than 1 drink a day. For men, this means no more than 2 drinks a day. "One drink" means one of these: °¨ 12 oz of beer. °¨ 5 oz of wine. °¨ 1½ oz of hard  liquor. °Caffeine  °· If caffeine gives you a fast heartbeat, do not eat, drink, or use anything with caffeine in it. °· If caffeine does not seem to give you a fast heartbeat, limit how much caffeine you eat, drink, or use. °Stimulant drugs  °· Do not use stimulant drugs. These are drugs like cocaine or methamphetamine. If you need help quitting, ask your doctor. °General instructions  °· Stay at a healthy weight. °· Exercise regularly. Ask your doctor to suggest some good activities for you. Try one of these options: °¨ 150 minutes a week of gentle exercise, like walking or yoga. °¨ 75 minutes a week of exercise that is very active, like running or swimming. °¨ A combination of gentle exercise and very active exercise. °· Do home treatments to slow down your heartbeat as told by your doctor. °· Take over-the-counter and prescription medicines only as told by your doctor. °Contact a doctor if: °· You have a fast heartbeat more often. °· Times of having a fast heartbeat last longer than before. °· Your home treatments to slow down your heartbeat do not help. °· You have new symptoms. °Get help right away if: °· You have chest pain. °· Your symptoms get worse. °· You have trouble breathing. °· Your heart beats very fast for more than 20 minutes. °· You pass out (faint). °These symptoms may be an emergency. Do not wait to see if the symptoms will go away. Get medical help right away. Call your   local emergency services (911 in the [place] Do not drive yourself to the hospital. °This information is not intended to replace advice given to you by your health care provider. Make sure you discuss any questions you have with your health care provider. °Document Released: 04/24/2005 Document Revised: 12/30/2015 Document Reviewed: 12/30/2015 °Elsevier Interactive Patient Education © 2017 [employer] ° °

## 2016-11-14 NOTE — Progress Notes (Signed)
Discharged to home with his father.  Given a dose of ativan and oxycodone prior to discharge per Dr. Margaretmary EddyShah's orders.

## 2016-11-14 NOTE — Discharge Summary (Signed)
[facility] [place] at [facility]   PATIENT NAME: Kurt Horton    MR#:  [id number]  DATE OF BIRTH:  21-Mar-1961  DATE OF ADMISSION:  11/12/2016   ADMITTING PHYSICIAN: Marguarite ArbourJeffrey D Sparks, MD  DATE OF DISCHARGE: 11/14/2016 11:24 AM  PRIMARY CARE PHYSICIAN: [facility]   ADMISSION DIAGNOSIS:  SVT (supraventricular tachycardia) (HCC) [I47.1] Alcohol withdrawal syndrome with complication (HCC) [F10.239] DISCHARGE DIAGNOSIS:  Principal Problem:   EtOH dependence (HCC) Active Problems:   Type II diabetes mellitus with manifestations (HCC)   SVT (supraventricular tachycardia) (HCC)   HTN (hypertension)   Alcohol withdrawal (HCC)   Substance induced mood disorder (HCC)  SECONDARY DIAGNOSIS:   Past Medical History:  Diagnosis Date  . Alcohol withdrawal (HCC) 11/12/2016  . Diabetes mellitus without complication (HCC)   . DJD (degenerative joint disease)   . Hypertension   . Myasthenia gravis (HCC)   . Renal disorder    HOSPITAL COURSE:  Meda CoffeeJohn D Duncanis a 56 y.o.malehas a past medical history significant for chronic pain, HTN, DM, and myesthenia gravis admitted with severe back pain. Has beein drinking ETOH to dull the pain. In ER, pt had recurrent bouts of SVT requiring Adenocard. BP is elevated. Pt appears to be in alcohol withdrawal. He is now admitted. The pt is tremulous and lethargic. Denies Cp or SOB  * SVT (supraventricular tachycardia):  - He is converted back to normal sinus rhythm - Rate is well controlled on Cardizem and metoprolol - normal 2-D echo - outpt cardio f/up  * Alcohol withdrawal - He is going through a lot of stress and anxiety, makes him go back on alcohol drinking due to uncontrolled pain - Feels very frustrated for not able to get his pain better managed as an outpatient - He feels scheduling all his appointment has been very difficult and cumbersome  * Anxiety/depression - Has appointment with Dr. Redmond BasemanHayden on 7/30 and  with psychotherapist on 7/17 - seen by psych here, no changes  *Type II diabetes mellitus with manifestations (HCC) - Continue insulin sliding scale and metformin  *ETOH dependence (HCC): Counseled - May benefit from AA as an outpatient  DISCHARGE CONDITIONS:  stable CONSULTS OBTAINED:  Treatment Team:  Caroleen Hammanunningham, Kristin, FNP Laurier NancyKhan, Shaukat A, MD Patrick Northavi, Himabindu, MD Clapacs, Jackquline DenmarkJohn T, MD DRUG ALLERGIES:   Allergies  Allergen Reactions  . Other Other (See Comments)    Patient states he can't take any "mycin" medications because myasthenia gravis.   DISCHARGE MEDICATIONS:   Allergies as of 11/14/2016      Reactions   Other Other (See Comments)   Patient states he can't take any "mycin" medications because myasthenia gravis.      Medication List    TAKE these medications   ALPRAZolam 0.5 MG tablet Commonly known as:  XANAX Take 1 or 2 tablets 30 min. prior to MRI.  You must not drink alcohol, drive, or operate dangerous equipment after taking this medication.   citalopram 20 MG tablet Commonly known as:  CELEXA Take 1 tablet (20 mg total) by mouth daily.   clonazePAM 0.5 MG tablet Commonly known as:  KLONOPIN Take 1 tablet (0.5 mg total) by mouth 2 (two) times daily as needed for anxiety.   diltiazem 120 MG 24 hr capsule Commonly known as:  CARDIZEM CD Take 1 capsule (120 mg total) by mouth daily.   fentaNYL 50 MCG/HR Commonly known as:  DURAGESIC Place 1 patch (50 mcg total) onto the skin every 3 (three)  days. Notes to patient:  NO LONGER TAKING   furosemide 40 MG tablet Commonly known as:  LASIX Take 40 mg by mouth daily as needed for fluid (if taking prednisone).   gabapentin 300 MG capsule Commonly known as:  NEURONTIN Take 2 capsules (600 mg total) by mouth 3 (three) times daily.   lidocaine-prilocaine cream Commonly known as:  EMLA Apply 1 application topically as needed. Apply to feet twice daily.   lisinopril 10 MG tablet Commonly known as:   PRINIVIL,ZESTRIL Take 1 tablet (10 mg total) by mouth daily.   metFORMIN 500 MG tablet Commonly known as:  GLUCOPHAGE Take 1 tablet (500 mg total) by mouth daily.   metoprolol tartrate 25 MG tablet Commonly known as:  LOPRESSOR Take 1 tablet (25 mg total) by mouth 2 (two) times daily.   mycophenolate 500 MG tablet Commonly known as:  CELLCEPT Take one pill in the morning and two pills at night.   oxyCODONE 5 MG immediate release tablet Commonly known as:  Oxy IR/ROXICODONE Take 1 tablet (5 mg total) by mouth every 8 (eight) hours as needed.   SUMAtriptan 100 MG tablet Commonly known as:  IMITREX Take 1 tablet (100 mg total) by mouth every 2 (two) hours as needed for migraine. May repeat in 2 hours if headache persists or recurs.   zolpidem 10 MG tablet Commonly known as:  AMBIEN Take 1 tablet (10 mg total) by mouth at bedtime as needed for sleep.        DISCHARGE INSTRUCTIONS:   DIET:  Regular diet DISCHARGE CONDITION:  Good ACTIVITY:  Activity as tolerated OXYGEN:  Home Oxygen: No.  Oxygen Delivery: room air DISCHARGE LOCATION:  home   If you experience worsening of your admission symptoms, develop shortness of breath, life threatening emergency, suicidal or homicidal thoughts you must seek medical attention immediately by calling 911 or calling your MD immediately  if symptoms less severe.  You Must read complete instructions/literature along with all the possible adverse reactions/side effects for all the Medicines you take and that have been prescribed to you. Take any new Medicines after you have completely understood and accpet all the possible adverse reactions/side effects.   Please note  You were cared for by a hospitalist during your hospital stay. If you have any questions about your discharge medications or the care you received while you were in the hospital after you are discharged, you can call the unit and asked to speak with the hospitalist on call  if the hospitalist that took care of you is not available. Once you are discharged, your primary care physician will handle any further medical issues. Please note that NO REFILLS for any discharge medications will be authorized once you are discharged, as it is imperative that you return to your primary care physician (or establish a relationship with a primary care physician if you do not have one) for your aftercare needs so that they can reassess your need for medications and monitor your lab values.    On the day of Discharge:  VITAL SIGNS:  Blood pressure 134/69, pulse 78, temperature 98.8 F (37.1 C), temperature source Oral, resp. rate 14, height 5\' 11"  (1.803 m), weight 90 kg (198 lb 6.4 oz), SpO2 98 %. PHYSICAL EXAMINATION:  GENERAL:  56 y.o.-year-old patient lying in the bed with no acute distress.  EYES: Pupils equal, round, reactive to light and accommodation. No scleral icterus. Extraocular muscles intact.  HEENT: Head atraumatic, normocephalic. Oropharynx and nasopharynx clear.  NECK:  Supple, no jugular venous distention. No thyroid enlargement, no tenderness.  LUNGS: Normal breath sounds bilaterally, no wheezing, rales,rhonchi or crepitation. No use of accessory muscles of respiration.  CARDIOVASCULAR: S1, S2 normal. No murmurs, rubs, or gallops.  ABDOMEN: Soft, non-tender, non-distended. Bowel sounds present. No organomegaly or mass.  EXTREMITIES: No pedal edema, cyanosis, or clubbing.  NEUROLOGIC: Cranial nerves II through XII are intact. Muscle strength 5/5 in all extremities. Sensation intact. Gait not checked.  PSYCHIATRIC: The patient is alert and oriented x 3.  SKIN: No obvious rash, lesion, or ulcer.  DATA REVIEW:   CBC  Recent Labs Lab 11/14/16 0449  WBC 5.5  HGB 9.6*  HCT 28.1*  PLT 167    Chemistries   Recent Labs Lab 11/12/16 1024 11/13/16 0731 11/14/16 0449  NA 133* 138 135  K 3.0* 3.6 3.6  CL 94* 105 103  CO2 26 28 26   GLUCOSE 204* 114* 107*    BUN 11 10 13   CREATININE 0.65 0.66 0.72  CALCIUM 8.8* 8.4* 8.7*  MG 1.4*  --   --   AST 74* 46*  --   ALT 42 31  --   ALKPHOS 72 54  --   BILITOT 0.9 1.0  --     Follow-up Information    [name], MD. Go on 11/23/2016.   Specialty:  Internal Medicine Why:  Time: 9:00 a.m. Contact information: [address] [place] [address] [phone]        Laurier Nancy, MD. Nyra Capes on 12/12/2016.   Specialty:  Cardiology Why:  Appointment Time: 9:00am with Valorie Roosevelt, NP Contact information: [address] [place] [address] [phone]          Management plans discussed with the patient, family and they are in agreement.  CODE STATUS: Prior   TOTAL TIME TAKING CARE OF THIS PATIENT: 45 minutes.    Delfino Lovett M.D on 11/14/2016 at 5:22 PM  Between 7am to 6pm - Pager - [phone]  After 6pm go to [email or web address] - Social research officer, government  [facility]  Office  [phone]  CC: Primary care physician; Raynelle Bring   Note: This dictation was prepared with Dragon dictation along with smaller phrase technology. Any transcriptional errors that result from this process are unintentional.

## 2016-11-14 NOTE — Plan of Care (Signed)
Problem: Health Behavior/Discharge Planning: Goal: Ability to manage health-related needs will improve Outcome: Adequate for Discharge INSTRUCTIONS REGARDING MEDICATIONS AND ADMINISTRATION INSTRUCTIONS.  HE HAS HAD NUMEROUS STAFF INTERACTIONS RE HIS ALCOHOL ABUSE

## 2016-11-14 NOTE — Progress Notes (Signed)
SUBJECTIVE: Feeling less tremors today and no chest pain or palpitation.   Vitals:   11/13/16 1155 11/13/16 2006 11/14/16 0355 11/14/16 0813  BP: 137/77 111/60 126/79 134/69  Pulse: 74 86 77 78  Resp: 16 18 18 14   Temp: 98.2 F (36.8 C) 98.6 F (37 C) 98.3 F (36.8 C) 98.8 F (37.1 C)  TempSrc: Oral Oral Oral Oral  SpO2: 100% 95% 99% 98%  Weight:   198 lb 6.4 oz (90 kg)   Height:        Intake/Output Summary (Last 24 hours) at 11/14/16 1039 Last data filed at 11/14/16 0926  Gross per 24 hour  Intake           626.67 ml  Output                0 ml  Net           626.67 ml    LABS: Basic Metabolic Panel:  Recent Labs  40/98/1105/12/23 1024 11/13/16 0731 11/14/16 0449  NA 133* 138 135  K 3.0* 3.6 3.6  CL 94* 105 103  CO2 26 28 26   GLUCOSE 204* 114* 107*  BUN 11 10 13   CREATININE 0.65 0.66 0.72  CALCIUM 8.8* 8.4* 8.7*  MG 1.4*  --   --    Liver Function Tests:  Recent Labs  11/12/16 1024 11/13/16 0731  AST 74* 46*  ALT 42 31  ALKPHOS 72 54  BILITOT 0.9 1.0  PROT 6.0* 5.5*  ALBUMIN 3.5 3.2*   No results for input(s): LIPASE, AMYLASE in the last 72 hours. CBC:  Recent Labs  11/13/16 0731 11/14/16 0449  WBC 4.2 5.5  HGB 10.3* 9.6*  HCT 30.1* 28.1*  MCV 103.1* 103.0*  PLT 164 167   Cardiac Enzymes: No results for input(s): CKTOTAL, CKMB, CKMBINDEX, TROPONINI in the last 72 hours. BNP: Invalid input(s): POCBNP D-Dimer: No results for input(s): DDIMER in the last 72 hours. Hemoglobin A1C:  Recent Labs  11/12/16 1024  HGBA1C 5.6   Fasting Lipid Panel: No results for input(s): CHOL, HDL, LDLCALC, TRIG, CHOLHDL, LDLDIRECT in the last 72 hours. Thyroid Function Tests:  Recent Labs  11/12/16 1024  TSH 0.989   Anemia Panel: No results for input(s): VITAMINB12, FOLATE, FERRITIN, TIBC, IRON, RETICCTPCT in the last 72 hours.   PHYSICAL EXAM General: Well developed, well nourished, in no acute distress HEENT:  Normocephalic and atramatic Neck:   No JVD.  Lungs: Clear bilaterally to auscultation and percussion. Heart: HRRR . Normal S1 and S2 without gallops or murmurs.  Abdomen: Bowel sounds are positive, abdomen soft and non-tender  Msk:  Back normal, normal gait. Normal strength and tone for age. Extremities: No clubbing, cyanosis or edema.   Neuro: Alert and oriented X 3. Psych:  Good affect, responds appropriately  TELEMETRY: NSR  ASSESSMENT AND PLAN: S/P SVT and ETOH dependence, continue cardizem and metoprolol. Will give f/u in office upon d/C next week.  Principal Problem:   EtOH dependence (HCC) Active Problems:   Type II diabetes mellitus with manifestations (HCC)   SVT (supraventricular tachycardia) (HCC)   HTN (hypertension)   Alcohol withdrawal (HCC)   Substance induced mood disorder (HCC)    Mariellen Blaney A, MD, [facility] 11/14/2016 10:39 AM

## 2016-11-17 ENCOUNTER — Telehealth: Payer: Self-pay | Admitting: Neurology

## 2016-11-17 MED ORDER — MYCOPHENOLATE MOFETIL 500 MG PO TABS: ORAL_TABLET | ORAL | 5 refills | Status: DC

## 2016-11-17 MED ORDER — PREDNISONE 10 MG PO TABS: ORAL_TABLET | ORAL | 0 refills | Status: DC

## 2016-11-17 NOTE — Telephone Encounter (Signed)
He called to leave a message that he was feeling weaker and refills on his CellCept and asked about prednisone again.  I called and got his answering service twice  I left a message that I will renew the CellCept and call in prednisone (I will call in a 12 day taper from 60 mg to 10 mg) I also advised him to call us back next week if he feels he is getting weaker or not any better and to go to the emergency room if he feels he is getting much weaker.

## 2016-11-17 NOTE — Telephone Encounter (Signed)
Pt calling for refill of mycophenolate (CELLCEPT) 500 MG tablet pt is also asking for a presciption for Prednisone (due to sever symptoms that he is experiencing re: extreme double vision) please call.

## 2016-11-17 NOTE — Telephone Encounter (Signed)
The patient called back and wanted to know where his Rx was for cellcept and prednisone. These were sent in to [facility] at 1 pm today. The patient called at 1:30 pm. I indicated that he needs to give time for the pharmacy to process the Rx.

## 2016-12-14 ENCOUNTER — Telehealth: Payer: Self-pay | Admitting: Neurology

## 2016-12-14 NOTE — Telephone Encounter (Signed)
Pt called the clinic said he cannot find any provider in [place] that will give him pain medication for his back. He is teary eyed. Patient is requesting to speak with Dr Epimenio FootSater only

## 2016-12-14 NOTE — Telephone Encounter (Signed)
Rn spoke with Dr. Epimenio FootSater about pt could not find pain md. Per Dr. Epimenio FootSater he only manages his myasthenia gravis, not his pain. Dr. Epimenio FootSater will call patient back to discuss this with him.

## 2016-12-15 ENCOUNTER — Telehealth: Payer: Self-pay | Admitting: Neurology

## 2016-12-15 NOTE — Telephone Encounter (Signed)
Pt called the clinic  He waiting for a call from Dr Epimenio FootSater

## 2016-12-15 NOTE — Telephone Encounter (Signed)
I left another message as he did not answer the phone    12/15/16   12:27 pm

## 2016-12-15 NOTE — Telephone Encounter (Signed)
I returned his phone call and got the voice mail again.   A left masses that we are can be unable to fill those prescriptions and that he should follow-up with the pain management Center. Please see previous phone message for details.

## 2016-12-15 NOTE — Telephone Encounter (Signed)
Left messages for [name] that I am returning his phone call.   No details left on the voicemail ------------------------------------------------------------------  Yesterday he called that he did not have a doctor that was prescribing pain medication.  In the past, I prescribed x 2 when he first moved to this area but told him he needs to get established with a clinic. His primary care doctor has provided prescriptions in July. From the [place] controlled substance database, it appears that he has filled 60 pills of 7/13 and 60 pills on 7/19 of oxycodone (Dr. Marcello FennelHande).    Also, he requested the second script from me (10/11/16) only a few days after his PCP wrote his first script for oxycodone.        We have sent referrals and he turned down an appointment that was set up ([facility] . Additionally, from the notes, and looks like an appointment was scheduled for him last month (with Dr. Metta Clinesrisp) after he went to the emergency room.      He needs to get established with a pain medicine clinic and I cannot give an additional prescription at this time.

## 2016-12-16 ENCOUNTER — Encounter: Payer: Self-pay | Admitting: Emergency Medicine

## 2016-12-16 ENCOUNTER — Emergency Department
Admission: EM | Admit: 2016-12-16 | Discharge: 2016-12-16 | Disposition: A | Discharge status: Home / Self Care | Payer: Medicaid Other | Attending: Emergency Medicine | Admitting: Emergency Medicine

## 2016-12-16 DIAGNOSIS — M545 Low back pain, unspecified: Secondary | ICD-10-CM

## 2016-12-16 DIAGNOSIS — E119 Type 2 diabetes mellitus without complications: Secondary | ICD-10-CM | POA: Diagnosis not present

## 2016-12-16 DIAGNOSIS — F10920 Alcohol use, unspecified with intoxication, uncomplicated: Secondary | ICD-10-CM | POA: Diagnosis not present

## 2016-12-16 DIAGNOSIS — Z7984 Long term (current) use of oral hypoglycemic drugs: Secondary | ICD-10-CM | POA: Diagnosis not present

## 2016-12-16 DIAGNOSIS — F1994 Other psychoactive substance use, unspecified with psychoactive substance-induced mood disorder: Secondary | ICD-10-CM | POA: Diagnosis not present

## 2016-12-16 DIAGNOSIS — G8929 Other chronic pain: Secondary | ICD-10-CM | POA: Insufficient documentation

## 2016-12-16 DIAGNOSIS — I10 Essential (primary) hypertension: Secondary | ICD-10-CM | POA: Insufficient documentation

## 2016-12-16 DIAGNOSIS — Z79899 Other long term (current) drug therapy: Secondary | ICD-10-CM | POA: Insufficient documentation

## 2016-12-16 DIAGNOSIS — F329 Major depressive disorder, single episode, unspecified: Secondary | ICD-10-CM | POA: Diagnosis present

## 2016-12-16 LAB — CBC
HCT: 33.7 % — ABNORMAL LOW (ref 40.0–52.0)
Hemoglobin: 11.3 g/dL — ABNORMAL LOW (ref 13.0–18.0)
MCH: 32.4 pg (ref 26.0–34.0)
MCHC: 33.5 g/dL (ref 32.0–36.0)
MCV: 96.7 fL (ref 80.0–100.0)
PLATELETS: 206 10*3/uL (ref 150–440)
RBC: 3.49 MIL/uL — AB (ref 4.40–5.90)
RDW: 17 % — ABNORMAL HIGH (ref 11.5–14.5)
WBC: 3.4 10*3/uL — ABNORMAL LOW (ref 3.8–10.6)

## 2016-12-16 LAB — COMPREHENSIVE METABOLIC PANEL
ALT: 22 U/L (ref 17–63)
ANION GAP: 11 (ref 5–15)
AST: 41 U/L (ref 15–41)
Albumin: 3.7 g/dL (ref 3.5–5.0)
Alkaline Phosphatase: 74 U/L (ref 38–126)
BUN: 12 mg/dL (ref 6–20)
CHLORIDE: 103 mmol/L (ref 101–111)
CO2: 27 mmol/L (ref 22–32)
CREATININE: 0.9 mg/dL (ref 0.61–1.24)
Calcium: 8.3 mg/dL — ABNORMAL LOW (ref 8.9–10.3)
Glucose, Bld: 145 mg/dL — ABNORMAL HIGH (ref 65–99)
Potassium: 3.4 mmol/L — ABNORMAL LOW (ref 3.5–5.1)
SODIUM: 141 mmol/L (ref 135–145)
Total Bilirubin: 0.5 mg/dL (ref 0.3–1.2)
Total Protein: 6.4 g/dL — ABNORMAL LOW (ref 6.5–8.1)

## 2016-12-16 LAB — URINE DRUG SCREEN, QUALITATIVE (ARMC ONLY)
Amphetamines, Ur Screen: NOT DETECTED
BARBITURATES, UR SCREEN: NOT DETECTED
Benzodiazepine, Ur Scrn: POSITIVE — AB
CANNABINOID 50 NG, UR ~~LOC~~: NOT DETECTED
Cocaine Metabolite,Ur ~~LOC~~: NOT DETECTED
MDMA (ECSTASY) UR SCREEN: NOT DETECTED
METHADONE SCREEN, URINE: NOT DETECTED
Opiate, Ur Screen: NOT DETECTED
Phencyclidine (PCP) Ur S: NOT DETECTED
TRICYCLIC, UR SCREEN: NOT DETECTED

## 2016-12-16 LAB — ETHANOL: ALCOHOL ETHYL (B): 457 mg/dL — AB (ref <5)

## 2016-12-16 MED ORDER — SODIUM CHLORIDE 0.9 % IV SOLN
1000.0000 mL | Freq: Once | INTRAVENOUS | Status: AC
  Administered 2016-12-16: 1000 mL via INTRAVENOUS

## 2016-12-16 MED ORDER — KETOROLAC TROMETHAMINE 30 MG/ML IJ SOLN
30.0000 mg | Freq: Once | INTRAMUSCULAR | Status: AC
  Administered 2016-12-16: 30 mg via INTRAVENOUS
  Filled 2016-12-16: qty 1

## 2016-12-16 NOTE — ED Notes (Signed)
Pt's father called at [phone] with consent from the patient to inform him that the patient is being discharged and he can come pick him up. Unable to reach the father, left voicemail with call back number provided.

## 2016-12-16 NOTE — ED Notes (Signed)

## 2016-12-16 NOTE — ED Provider Notes (Signed)
[facility] Emergency Department Provider Note   ____________________________________________    I have reviewed the triage vital signs and the nursing notes.   HISTORY  Chief Complaint Depression and Alcohol Intoxication     HPI Kurt PhenesJohn D Guillot is a 56 y.o. male who presents to the emergency department with complaints of chronic pain. Patient reports no one will give him pain medication which he notes is making him depressed. He denies SI or HI however. He [place] quite [place] so he says. He says he has been medicating with alcohol.patient reports chronic back pain which he notes is severe. No lower extremity weakness or urinary incontinence. Patient has fentanyl patches in place   Past Medical History:  Diagnosis Date  . Alcohol withdrawal (HCC) 11/12/2016  . Diabetes mellitus without complication (HCC)   . DJD (degenerative joint disease)   . Hypertension   . Myasthenia gravis (HCC)   . Renal disorder     Patient Active Problem List   Diagnosis Date Noted  . Alcohol withdrawal (HCC) 11/13/2016  . Substance induced mood disorder (HCC) 11/13/2016  . SVT (supraventricular tachycardia) (HCC) 11/12/2016  . HTN (hypertension) 11/12/2016  . EtOH dependence (HCC) 11/12/2016  . Lumbar radiculopathy 08/24/2016  . Depression with anxiety 08/24/2016  . Chronic low back pain 01/04/2016  . Pressure ulcer 12/29/2015  . Osteomyelitis (HCC) 12/26/2015  . PVD (peripheral vascular disease) (HCC) 12/26/2015  . Type II diabetes mellitus with manifestations (HCC) 12/26/2015  . Myasthenia gravis (HCC) 12/26/2015    Past Surgical History:  Procedure Laterality Date  . AMPUTATION TOE Left 12/29/2015   Procedure: AMPUTATION TOE;  Surgeon: Gwyneth RevelsJustin Fowler, DPM;  Location: [facility] ORS;  Service: Podiatry;  Laterality: Left;  . PERIPHERAL VASCULAR CATHETERIZATION N/A 12/27/2015   Procedure: Lower Extremity Angiography;  Surgeon:  Annice NeedyJason S Dew, MD;  Location: [facility] INVASIVE CV LAB;  Service: Cardiovascular;  Laterality: N/A;    Prior to Admission medications   Medication Sig Start Date End Date Taking? Authorizing Provider  ALPRAZolam Prudy Feeler(XANAX) 0.5 MG tablet Take 1 or 2 tablets 30 min. prior to MRI.  You must not drink alcohol, drive, or operate dangerous equipment after taking this medication. Patient not taking: Reported on 11/12/2016 08/30/16   Sater, Pearletha Furlichard A, MD  citalopram (CELEXA) 20 MG tablet Take 1 tablet (20 mg total) by mouth daily. 08/24/16   Sater, Pearletha Furlichard A, MD  clonazePAM (KLONOPIN) 0.5 MG tablet Take 1 tablet (0.5 mg total) by mouth 2 (two) times daily as needed for anxiety. Patient not taking: Reported on 08/24/2016 12/30/15   Alford HighlandWieting, Richard, MD  diltiazem (CARDIZEM CD) 120 MG 24 hr capsule Take 1 capsule (120 mg total) by mouth daily. 11/14/16 11/14/17  Delfino LovettShah, Vipul, MD  fentaNYL (DURAGESIC - DOSED MCG/HR) 50 MCG/HR Place 1 patch (50 mcg total) onto the skin every 3 (three) days. Patient not taking: Reported on 08/24/2016 01/04/16   Asa LenteSater, Richard A, MD  furosemide (LASIX) 40 MG tablet Take 40 mg by mouth daily as needed for fluid (if taking prednisone).     [provider]  gabapentin (NEURONTIN) 300 MG capsule Take 2 capsules (600 mg total) by mouth 3 (three) times daily. 08/24/16   Sater, Pearletha Furlichard A, MD  lidocaine-prilocaine (EMLA) cream Apply 1 application topically as needed. Apply to feet twice daily.    [provider]  lisinopril (PRINIVIL,ZESTRIL) 10 MG tablet Take 1 tablet (10 mg total) by mouth daily. 12/30/15  Alford Highland, MD  metFORMIN (GLUCOPHAGE) 500 MG tablet Take 1 tablet (500 mg total) by mouth daily. 12/30/15   Alford Highland, MD  metoprolol tartrate (LOPRESSOR) 25 MG tablet Take 1 tablet (25 mg total) by mouth 2 (two) times daily. 11/14/16   Delfino Lovett, MD  mycophenolate (CELLCEPT) 500 MG tablet Take one pill in the morning and two pills at night. 11/17/16   Sater, Pearletha Furl,  MD  oxyCODONE (OXY IR/ROXICODONE) 5 MG immediate release tablet Take 1 tablet (5 mg total) by mouth every 8 (eight) hours as needed. 10/11/16   Sater, Pearletha Furl, MD  predniSONE (DELTASONE) 10 MG tablet Take 6 po x 2d, then 5 po x 2d, then 4 po x 2d, then 3 po x 2d, then 2 po x 2d, then 1 po x 2d 11/17/16   Sater, Pearletha Furl, MD  SUMAtriptan (IMITREX) 100 MG tablet Take 1 tablet (100 mg total) by mouth every 2 (two) hours as needed for migraine. May repeat in 2 hours if headache persists or recurs. Patient not taking: Reported on 08/24/2016 12/30/15   Alford Highland, MD  zolpidem (AMBIEN) 10 MG tablet Take 1 tablet (10 mg total) by mouth at bedtime as needed for sleep. Patient not taking: Reported on 11/12/2016 12/30/15   Alford Highland, MD     Allergies Other  Family History  Problem Relation Age of Onset  . Rheum arthritis Father     Social History Social History  Substance Use Topics  . Smoking status: Never Smoker  . Smokeless tobacco: Never Used  . Alcohol use Yes    Review of Systems  Constitutional: No fever Eyes: No visual changes.  ENT: No neck pain Cardiovascular: Denies chest pain. Respiratory: Denies shortness of breath. Gastrointestinal: No abdominal pain.  No nausea, no vomiting.   Genitourinary: Negative for incontinence Musculoskeletal: chronic back pain Skin: Negative for rash. Neurological: Negative for lower extremity weakness weakness   ____________________________________________   PHYSICAL EXAM:  VITAL SIGNS: ED Triage Vitals [12/16/16 1638]  Enc Vitals Group     BP (!) 144/98     Pulse Rate (!) 109     Resp 13     Temp 98.4 F (36.9 C)     Temp Source Oral     SpO2 95 %     Weight 90.7 kg (200 lb)     Height 1.88 m (6\' 2" )     Head Circumference      Peak Flow      Pain Score      Pain Loc      Pain Edu?      Excl. in GC?     Constitutional: Alert and oriented. No acute distress.  Eyes: Conjunctivae are normal.  Head:  Atraumatic. Nose: No congestion/rhinnorhea. Mouth/Throat: Mucous membranes are moist.   Neck:  Painless ROM, no vertebral tenderness to palpation Cardiovascular: mild tachycardia, regular rhythm. Grossly normal heart sounds.  Good peripheral circulation. Respiratory: Normal respiratory effort.  No retractions. Lungs CTAB. Gastrointestinal: Soft and nontender. No distention.  No CVA tenderness. Genitourinary: deferred Musculoskeletal:  Warm and well perfused Neurologic:  Normal speech and language. No gross focal neurologic deficits are appreciated.  Skin:  Skin is warm, dry. Abrasion to the left knee Psychiatric: Mood and affect are normal.   ____________________________________________   LABS (all labs ordered are listed, but only abnormal results are displayed)  Labs Reviewed  CBC - Abnormal; Notable for the following:       Result Value  WBC 3.4 (*)    RBC 3.49 (*)    Hemoglobin 11.3 (*)    HCT 33.7 (*)    RDW 17.0 (*)    All other components within normal limits  COMPREHENSIVE METABOLIC PANEL - Abnormal; Notable for the following:    Potassium 3.4 (*)    Glucose, Bld 145 (*)    Calcium 8.3 (*)    Total Protein 6.4 (*)    All other components within normal limits  ETHANOL - Abnormal; Notable for the following:    Alcohol, Ethyl (B) 457 (*)    All other components within normal limits  URINE DRUG SCREEN, QUALITATIVE ([facility] ONLY) - Abnormal; Notable for the following:    Benzodiazepine, Ur Scrn POSITIVE (*)    All other components within normal limits   ____________________________________________  EKG  None ____________________________________________  RADIOLOGY  None ____________________________________________   PROCEDURES  Procedure(s) performed: No    Critical Care performed:No ____________________________________________   INITIAL IMPRESSION / ASSESSMENT AND PLAN / ED COURSE  Pertinent labs & imaging results that were available during my care of  the patient were reviewed by me and considered in my medical decision making (see chart for details).  Patient in no acute distress. I informed him that we do not treat chronic pain in the emergency department with narcotics. But I told him I would happily give him Toradol which he is comfortable with.he is mildly tachycardic I will check labs as well.   ----------------------------------------- 5:20 PM on 12/16/2016 -----------------------------------------  Notified of ethanol level of 457. Patient is on cardiac monitor   ----------------------------------------- 11:32 PM on 12/16/2016 -----------------------------------------  Patient is well-appearing and anxious to leave. He was cleared by psychiatry, his father will pick him up    ____________________________________________   FINAL CLINICAL IMPRESSION(S) / ED DIAGNOSES  Final diagnoses:  Alcoholic intoxication without complication (HCC)  Chronic midline low back pain without sciatica      NEW MEDICATIONS STARTED DURING THIS VISIT:  Discharge Medication List as of 12/16/2016 10:48 PM       Note:  This document was prepared using Dragon voice recognition software and may include unintentional dictation errors.    Jene Every, MD 12/16/16 [phone]

## 2016-12-16 NOTE — ED Triage Notes (Signed)
Pt presents to ED via [facility], per EMS pt was picked up from his parents front porch where he lives with his parents. EMS reports that patient has been drinking all day and admits to depression, pt admits to drinking 1/5 of bacardi today. EMS reports that patient is alert to verbal stimuli, pt noted to be asleep on arrival but awakens with mild verbal stimuli.Pt is noted to be covered with orange substance, pt states that he has been applying self tanner to himself, streaks noted to both legs and arms and to his abdomen. Pt is noted to have swelling to bilateral feet, with ulcer to R great toe. Pt is also noted to have 2 fentanyl patches to R outer thigh.

## 2016-12-16 NOTE — ED Notes (Signed)
Unable to reach his father, pt requested that we call a cab to come and pick him up. [employer] called and confirmed they will pick up patient and take him to the address provided by the patient. Pt assured this RN he has access to the domicile. Pt is awake, alert, and oriented x4, able to ambulate without assistance, and displays a steady and even gait. Pt is able to tolerate fluids. Pt discharged to the lobby to wait for the cab to come and pick him up.

## 2016-12-16 NOTE — ED Notes (Signed)
Pt desat to 87% on RA, placed on 2L O2 via [place].

## 2016-12-16 NOTE — ED Notes (Signed)
SOC computer set up in the room; [facility]

## 2016-12-16 NOTE — ED Notes (Signed)
No change in patient condition. Pt resting in bed with eyes closed. Pt remains on 2L O2 via [place]. Lights dimmed for patient comfort. No acute distress noted. Will continue to monitor for further patient needs.

## 2016-12-16 NOTE — ED Notes (Signed)
Pt resting in bed at this time with lights dimmed, pt remains on 2L O2 via [place]. Will continue to monitor for further patient needs.

## 2016-12-16 NOTE — ED Notes (Signed)
Pt provided with diet cola with ice. Pt also provided with a urinal and encouraged to urinate.

## 2016-12-16 NOTE — ED Notes (Signed)
Pt came out of the room dressed in his shirt and jeans and asked if anyone smoked that he can get a cigarette from; the patient was informed that he is in a hospital and smoking is not allowed on the property. Pt was then escorted back into the room, unclothed and dressed in the hospital gown and placed back in the bed and hooked up to the monitor. Pt displays an uneven, and unbalanced gait while ambulating.   This RN received a call from someone stating he is the patient's father, and asked for the patient's diagnosis and plan of care; this RN informed them that we do not share patient information because it would be a violation of HIPAA law, and offered them to speak with the patient directly.  They accepted and the patient was provided the phone.

## 2016-12-16 NOTE — ED Notes (Signed)
Pt started c/o pain of his "tailbone." MD asked if ok to give Toradol that was held earlier at this time; MD affirmed it was ok to do so.

## 2016-12-18 NOTE — Telephone Encounter (Signed)
LMTC (identified vm)/fim 

## 2016-12-31 ENCOUNTER — Emergency Department
Admission: EM | Admit: 2016-12-31 | Discharge: 2016-12-31 | Disposition: A | Discharge status: Home / Self Care | Payer: Medicaid Other | Attending: Emergency Medicine | Admitting: Emergency Medicine

## 2016-12-31 ENCOUNTER — Encounter: Payer: Self-pay | Admitting: Emergency Medicine

## 2016-12-31 DIAGNOSIS — Z7984 Long term (current) use of oral hypoglycemic drugs: Secondary | ICD-10-CM | POA: Diagnosis not present

## 2016-12-31 DIAGNOSIS — E119 Type 2 diabetes mellitus without complications: Secondary | ICD-10-CM | POA: Insufficient documentation

## 2016-12-31 DIAGNOSIS — M549 Dorsalgia, unspecified: Secondary | ICD-10-CM | POA: Insufficient documentation

## 2016-12-31 DIAGNOSIS — G8929 Other chronic pain: Secondary | ICD-10-CM

## 2016-12-31 DIAGNOSIS — I10 Essential (primary) hypertension: Secondary | ICD-10-CM | POA: Insufficient documentation

## 2016-12-31 DIAGNOSIS — Z008 Encounter for other general examination: Secondary | ICD-10-CM | POA: Diagnosis not present

## 2016-12-31 DIAGNOSIS — Z79899 Other long term (current) drug therapy: Secondary | ICD-10-CM | POA: Insufficient documentation

## 2016-12-31 DIAGNOSIS — F1010 Alcohol abuse, uncomplicated: Secondary | ICD-10-CM | POA: Diagnosis not present

## 2016-12-31 NOTE — ED Provider Notes (Signed)
[facility] Emergency Department Provider Note   ____________________________________________   I have reviewed the triage vital signs and the nursing notes.   HISTORY  Chief Complaint Clearance for RTS  History limited by: Not Limited   HPI Kurt Horton is a 56 y.o. male who presents to the emergency department today because he was told to present to the emergency department by RTS because of concern for chronic medical conditions. The patient does state that he would like to enter detox for alcohol use. His only complaint today is back pain. This is a chronic issue for the patient. It has been present for at least 15 years. [place] the [place], had been having it managed by an out of state doctor. He states he is using the alcohol to help manage his chronic pain. In terms of alcohol detox he states he did a five day program last year, did not have any seizures, or hallucinations with it. Denies any recent illness.    Past Medical History:  Diagnosis Date  . Alcohol withdrawal (HCC) 11/12/2016  . Diabetes mellitus without complication (HCC)   . DJD (degenerative joint disease)   . Hypertension   . Myasthenia gravis (HCC)   . Renal disorder     Patient Active Problem List   Diagnosis Date Noted  . Alcohol withdrawal (HCC) 11/13/2016  . Substance induced mood disorder (HCC) 11/13/2016  . SVT (supraventricular tachycardia) (HCC) 11/12/2016  . HTN (hypertension) 11/12/2016  . EtOH dependence (HCC) 11/12/2016  . Lumbar radiculopathy 08/24/2016  . Depression with anxiety 08/24/2016  . Chronic low back pain 01/04/2016  . Pressure ulcer 12/29/2015  . Osteomyelitis (HCC) 12/26/2015  . PVD (peripheral vascular disease) (HCC) 12/26/2015  . Type II diabetes mellitus with manifestations (HCC) 12/26/2015  . Myasthenia gravis (HCC) 12/26/2015    Past Surgical History:  Procedure Laterality Date  . AMPUTATION TOE Left 12/29/2015   Procedure:  AMPUTATION TOE;  Surgeon: Gwyneth Revels, DPM;  Location: [facility] ORS;  Service: Podiatry;  Laterality: Left;  . PERIPHERAL VASCULAR CATHETERIZATION N/A 12/27/2015   Procedure: Lower Extremity Angiography;  Surgeon: Annice Needy, MD;  Location: [facility] INVASIVE CV LAB;  Service: Cardiovascular;  Laterality: N/A;    Prior to Admission medications   Medication Sig Start Date End Date Taking? Authorizing Provider  ALPRAZolam Prudy Feeler) 0.5 MG tablet Take 1 or 2 tablets 30 min. prior to MRI.  You must not drink alcohol, drive, or operate dangerous equipment after taking this medication. Patient not taking: Reported on 11/12/2016 08/30/16   Sater, Pearletha Furl, MD  citalopram (CELEXA) 20 MG tablet Take 1 tablet (20 mg total) by mouth daily. 08/24/16   Sater, Pearletha Furl, MD  clonazePAM (KLONOPIN) 0.5 MG tablet Take 1 tablet (0.5 mg total) by mouth 2 (two) times daily as needed for anxiety. Patient not taking: Reported on 08/24/2016 12/30/15   Alford Highland, MD  diltiazem (CARDIZEM CD) 120 MG 24 hr capsule Take 1 capsule (120 mg total) by mouth daily. 11/14/16 11/14/17  Delfino Lovett, MD  fentaNYL (DURAGESIC - DOSED MCG/HR) 50 MCG/HR Place 1 patch (50 mcg total) onto the skin every 3 (three) days. Patient not taking: Reported on 08/24/2016 01/04/16   Asa Lente, MD  furosemide (LASIX) 40 MG tablet Take 40 mg by mouth daily as needed for fluid (if taking prednisone).     [provider]  gabapentin (NEURONTIN) 300 MG capsule Take 2 capsules (600 mg total) by mouth 3 (three) times  daily. 08/24/16   Sater, Pearletha Furl, MD  lidocaine-prilocaine (EMLA) cream Apply 1 application topically as needed. Apply to feet twice daily.    [provider]  lisinopril (PRINIVIL,ZESTRIL) 10 MG tablet Take 1 tablet (10 mg total) by mouth daily. 12/30/15   Alford Highland, MD  metFORMIN (GLUCOPHAGE) 500 MG tablet Take 1 tablet (500 mg total) by mouth daily. 12/30/15   Alford Highland, MD  metoprolol tartrate (LOPRESSOR) 25 MG  tablet Take 1 tablet (25 mg total) by mouth 2 (two) times daily. 11/14/16   Delfino Lovett, MD  mycophenolate (CELLCEPT) 500 MG tablet Take one pill in the morning and two pills at night. 11/17/16   Sater, Pearletha Furl, MD  oxyCODONE (OXY IR/ROXICODONE) 5 MG immediate release tablet Take 1 tablet (5 mg total) by mouth every 8 (eight) hours as needed. 10/11/16   Sater, Pearletha Furl, MD  predniSONE (DELTASONE) 10 MG tablet Take 6 po x 2d, then 5 po x 2d, then 4 po x 2d, then 3 po x 2d, then 2 po x 2d, then 1 po x 2d 11/17/16   Sater, Pearletha Furl, MD  SUMAtriptan (IMITREX) 100 MG tablet Take 1 tablet (100 mg total) by mouth every 2 (two) hours as needed for migraine. May repeat in 2 hours if headache persists or recurs. Patient not taking: Reported on 08/24/2016 12/30/15   Alford Highland, MD  zolpidem (AMBIEN) 10 MG tablet Take 1 tablet (10 mg total) by mouth at bedtime as needed for sleep. Patient not taking: Reported on 11/12/2016 12/30/15   Alford Highland, MD    Allergies Other  Family History  Problem Relation Age of Onset  . Rheum arthritis Father     Social History Social History  Substance Use Topics  . Smoking status: Never Smoker  . Smokeless tobacco: Never Used  . Alcohol use Yes     Comment: Pt states that last drink was today    Review of Systems Constitutional: No fever/chills Eyes: No visual changes. ENT: No sore throat. Cardiovascular: Denies chest pain. Respiratory: Denies shortness of breath. Gastrointestinal: No abdominal pain.  No nausea, no vomiting.  No diarrhea.   Genitourinary: Negative for dysuria. Musculoskeletal: Chronic back pain.  Skin: Negative for rash. Neurological: Negative for headaches, focal weakness or numbness.  ____________________________________________   PHYSICAL EXAM:  VITAL SIGNS: ED Triage Vitals  Enc Vitals Group     BP 12/31/16 1544 119/60     Pulse Rate 12/31/16 1544 (!) 101     Resp 12/31/16 1544 16     Temp 12/31/16 1544 99.1 F (37.3 C)      Temp Source 12/31/16 1544 Oral     SpO2 12/31/16 1544 96 %     Weight 12/31/16 1543 195 lb (88.5 kg)     Height 12/31/16 1543 6' (1.829 m)     Head Circumference --      Peak Flow --      Pain Score 12/31/16 1542 10   Constitutional: Alert and oriented. Well appearing and in no distress. Eyes: Conjunctivae are normal.  ENT   Head: Normocephalic and atraumatic.   Nose: No congestion/rhinnorhea.   Mouth/Throat: Mucous membranes are moist.   Neck: No stridor. Hematological/Lymphatic/Immunilogical: No cervical lymphadenopathy. Cardiovascular: Normal rate, regular rhythm.  No murmurs, rubs, or gallops.  Respiratory: Normal respiratory effort without tachypnea nor retractions. Breath sounds are clear and equal bilaterally. No wheezes/rales/rhonchi. Gastrointestinal: Soft and non tender. No rebound. No guarding.  Genitourinary: Deferred Musculoskeletal: Normal range of motion  in all extremities. No lower extremity edema. Neurologic:  Normal speech and language. No gross focal neurologic deficits are appreciated.  Skin:  Skin is warm, dry and intact. No rash noted. Psychiatric: Mood and affect are normal. Speech and behavior are normal. Patient exhibits appropriate insight and judgment.  ____________________________________________    LABS (pertinent positives/negatives)  None  ____________________________________________   EKG  None  ____________________________________________    RADIOLOGY  None  ____________________________________________   PROCEDURES  Procedures  ____________________________________________   INITIAL IMPRESSION / ASSESSMENT AND PLAN / ED COURSE  Pertinent labs & imaging results that were available during my care of the patient were reviewed by me and considered in my medical decision making (see chart for details).  Patient presented to the emergency department today for clearance for RTS. Patient is only complaining of  chronic back pain. He denies any history of complicated withdrawals from alcohol. I do feel patient is appropriate for outpatient detox.  ____________________________________________   FINAL CLINICAL IMPRESSION(S) / ED DIAGNOSES  Final diagnoses:  Alcohol abuse  Chronic back pain, unspecified back location, unspecified back pain laterality     Note: This dictation was prepared with Dragon dictation. Any transcriptional errors that result from this process are unintentional     Phineas Semen, MD 12/31/16 [phone]

## 2016-12-31 NOTE — ED Notes (Signed)
Pt has not contacted rts, wants help with his opoid dependence from chronic back pain. Was previously in pain mgmt.

## 2016-12-31 NOTE — ED Triage Notes (Signed)
Pt to ED via POV requesting medical clearance for RTS. Pt states that his last drink was today, states that she drank "a few ounces". Pt is calm and cooperative in triage at this time.

## 2016-12-31 NOTE — Discharge Instructions (Signed)
Please seek medical attention for any high fevers, chest pain, shortness of breath, change in behavior, persistent vomiting, bloody stool or any other new or concerning symptoms.  

## 2017-02-01 ENCOUNTER — Ambulatory Visit: Payer: Self-pay | Admitting: Neurology

## 2017-02-02 ENCOUNTER — Encounter: Payer: Self-pay | Admitting: Neurology

## 2017-02-08 ENCOUNTER — Other Ambulatory Visit: Payer: Self-pay | Admitting: Internal Medicine

## 2017-02-08 DIAGNOSIS — R600 Localized edema: Secondary | ICD-10-CM

## 2017-02-08 DIAGNOSIS — R29898 Other symptoms and signs involving the musculoskeletal system: Secondary | ICD-10-CM

## 2017-02-24 ENCOUNTER — Encounter: Payer: Self-pay | Admitting: Emergency Medicine

## 2017-02-24 ENCOUNTER — Inpatient Hospital Stay
Admission: EM | Admit: 2017-02-24 | Discharge: 2017-03-01 | DRG: 897 | Disposition: A | Discharge status: Home / Self Care | Payer: Medicaid Other | Attending: Internal Medicine | Admitting: Internal Medicine

## 2017-02-24 DIAGNOSIS — G7000 Myasthenia gravis without (acute) exacerbation: Secondary | ICD-10-CM | POA: Diagnosis present

## 2017-02-24 DIAGNOSIS — G4700 Insomnia, unspecified: Secondary | ICD-10-CM | POA: Diagnosis not present

## 2017-02-24 DIAGNOSIS — F10231 Alcohol dependence with withdrawal delirium: Principal | ICD-10-CM | POA: Diagnosis present

## 2017-02-24 DIAGNOSIS — Z89422 Acquired absence of other left toe(s): Secondary | ICD-10-CM

## 2017-02-24 DIAGNOSIS — F10931 Alcohol use, unspecified with withdrawal delirium: Secondary | ICD-10-CM

## 2017-02-24 DIAGNOSIS — Z7952 Long term (current) use of systemic steroids: Secondary | ICD-10-CM

## 2017-02-24 DIAGNOSIS — R45851 Suicidal ideations: Secondary | ICD-10-CM | POA: Diagnosis present

## 2017-02-24 DIAGNOSIS — F1020 Alcohol dependence, uncomplicated: Secondary | ICD-10-CM

## 2017-02-24 DIAGNOSIS — Z23 Encounter for immunization: Secondary | ICD-10-CM

## 2017-02-24 DIAGNOSIS — I482 Chronic atrial fibrillation: Secondary | ICD-10-CM | POA: Diagnosis present

## 2017-02-24 DIAGNOSIS — F418 Other specified anxiety disorders: Secondary | ICD-10-CM | POA: Diagnosis present

## 2017-02-24 DIAGNOSIS — F1010 Alcohol abuse, uncomplicated: Secondary | ICD-10-CM

## 2017-02-24 DIAGNOSIS — F329 Major depressive disorder, single episode, unspecified: Secondary | ICD-10-CM | POA: Diagnosis present

## 2017-02-24 DIAGNOSIS — Z881 Allergy status to other antibiotic agents status: Secondary | ICD-10-CM

## 2017-02-24 DIAGNOSIS — F32A Depression, unspecified: Secondary | ICD-10-CM

## 2017-02-24 DIAGNOSIS — Y908 Blood alcohol level of 240 mg/100 ml or more: Secondary | ICD-10-CM | POA: Diagnosis present

## 2017-02-24 DIAGNOSIS — Z79899 Other long term (current) drug therapy: Secondary | ICD-10-CM

## 2017-02-24 DIAGNOSIS — Z7984 Long term (current) use of oral hypoglycemic drugs: Secondary | ICD-10-CM

## 2017-02-24 DIAGNOSIS — E1140 Type 2 diabetes mellitus with diabetic neuropathy, unspecified: Secondary | ICD-10-CM | POA: Diagnosis present

## 2017-02-24 DIAGNOSIS — E876 Hypokalemia: Secondary | ICD-10-CM | POA: Diagnosis present

## 2017-02-24 DIAGNOSIS — I248 Other forms of acute ischemic heart disease: Secondary | ICD-10-CM | POA: Diagnosis not present

## 2017-02-24 DIAGNOSIS — I471 Supraventricular tachycardia: Secondary | ICD-10-CM | POA: Diagnosis not present

## 2017-02-24 DIAGNOSIS — R451 Restlessness and agitation: Secondary | ICD-10-CM | POA: Diagnosis not present

## 2017-02-24 DIAGNOSIS — F10239 Alcohol dependence with withdrawal, unspecified: Secondary | ICD-10-CM | POA: Diagnosis present

## 2017-02-24 DIAGNOSIS — I4891 Unspecified atrial fibrillation: Secondary | ICD-10-CM | POA: Diagnosis present

## 2017-02-24 DIAGNOSIS — I10 Essential (primary) hypertension: Secondary | ICD-10-CM | POA: Diagnosis present

## 2017-02-24 DIAGNOSIS — E1151 Type 2 diabetes mellitus with diabetic peripheral angiopathy without gangrene: Secondary | ICD-10-CM | POA: Diagnosis present

## 2017-02-24 DIAGNOSIS — F10929 Alcohol use, unspecified with intoxication, unspecified: Secondary | ICD-10-CM

## 2017-02-24 DIAGNOSIS — G8929 Other chronic pain: Secondary | ICD-10-CM | POA: Diagnosis present

## 2017-02-24 HISTORY — DX: Alcohol abuse, uncomplicated: F10.10

## 2017-02-24 LAB — CBC
HCT: 32.5 % — ABNORMAL LOW (ref 40.0–52.0)
Hemoglobin: 10.3 g/dL — ABNORMAL LOW (ref 13.0–18.0)
MCH: 23.5 pg — AB (ref 26.0–34.0)
MCHC: 31.7 g/dL — ABNORMAL LOW (ref 32.0–36.0)
MCV: 74 fL — ABNORMAL LOW (ref 80.0–100.0)
PLATELETS: 286 10*3/uL (ref 150–440)
RBC: 4.39 MIL/uL — AB (ref 4.40–5.90)
RDW: 20.7 % — ABNORMAL HIGH (ref 11.5–14.5)
WBC: 5.3 10*3/uL (ref 3.8–10.6)

## 2017-02-24 LAB — SALICYLATE LEVEL

## 2017-02-24 LAB — COMPREHENSIVE METABOLIC PANEL
ALT: 28 U/L (ref 17–63)
ANION GAP: 17 — AB (ref 5–15)
AST: 49 U/L — ABNORMAL HIGH (ref 15–41)
Albumin: 4 g/dL (ref 3.5–5.0)
Alkaline Phosphatase: 77 U/L (ref 38–126)
BILIRUBIN TOTAL: 0.6 mg/dL (ref 0.3–1.2)
BUN: 10 mg/dL (ref 6–20)
CALCIUM: 8.5 mg/dL — AB (ref 8.9–10.3)
CO2: 23 mmol/L (ref 22–32)
Chloride: 99 mmol/L — ABNORMAL LOW (ref 101–111)
Creatinine, Ser: 0.78 mg/dL (ref 0.61–1.24)
Glucose, Bld: 137 mg/dL — ABNORMAL HIGH (ref 65–99)
POTASSIUM: 3.1 mmol/L — AB (ref 3.5–5.1)
Sodium: 139 mmol/L (ref 135–145)
TOTAL PROTEIN: 7 g/dL (ref 6.5–8.1)

## 2017-02-24 LAB — ETHANOL: ALCOHOL ETHYL (B): 475 mg/dL — AB (ref <10)

## 2017-02-24 LAB — ACETAMINOPHEN LEVEL

## 2017-02-24 MED ORDER — FOLIC ACID 1 MG PO TABS
1.0000 mg | ORAL_TABLET | Freq: Every day | ORAL | Status: DC
  Administered 2017-02-24 – 2017-03-01 (×6): 1 mg via ORAL
  Filled 2017-02-24 (×5): qty 1

## 2017-02-24 MED ORDER — LORAZEPAM 2 MG/ML IJ SOLN: 0.0000 mg | Freq: Two times a day (BID) | INTRAMUSCULAR | Status: DC

## 2017-02-24 MED ORDER — FOLIC ACID 1 MG PO TABS
1.0000 mg | ORAL_TABLET | Freq: Every day | ORAL | Status: DC
  Filled 2017-02-24: qty 1

## 2017-02-24 MED ORDER — LORAZEPAM 2 MG PO TABS
0.0000 mg | ORAL_TABLET | Freq: Two times a day (BID) | ORAL | Status: DC
  Administered 2017-02-27: 2 mg via ORAL
  Filled 2017-02-24: qty 1

## 2017-02-24 MED ORDER — LORAZEPAM 2 MG PO TABS: 0.0000 mg | ORAL_TABLET | Freq: Four times a day (QID) | ORAL | Status: DC

## 2017-02-24 MED ORDER — LORAZEPAM 2 MG/ML IJ SOLN
0.0000 mg | Freq: Four times a day (QID) | INTRAMUSCULAR | Status: AC
  Administered 2017-02-25: 1 mg via INTRAVENOUS
  Administered 2017-02-25: 2 mg via INTRAVENOUS
  Administered 2017-02-26: 4 mg via INTRAVENOUS
  Filled 2017-02-24 (×3): qty 1
  Filled 2017-02-24: qty 2

## 2017-02-24 MED ORDER — VITAMIN B-1 100 MG PO TABS
100.0000 mg | ORAL_TABLET | Freq: Every day | ORAL | Status: DC
  Filled 2017-02-24: qty 1

## 2017-02-24 MED ORDER — LORAZEPAM 2 MG PO TABS
0.0000 mg | ORAL_TABLET | Freq: Four times a day (QID) | ORAL | Status: AC
  Administered 2017-02-25: 2 mg via ORAL
  Filled 2017-02-24: qty 1

## 2017-02-24 MED ORDER — VITAMIN B-1 100 MG PO TABS
100.0000 mg | ORAL_TABLET | Freq: Every day | ORAL | Status: DC
  Administered 2017-02-24 – 2017-03-01 (×6): 100 mg via ORAL
  Filled 2017-02-24 (×5): qty 1

## 2017-02-24 MED ORDER — LORAZEPAM 2 MG/ML IJ SOLN: 0.0000 mg | Freq: Four times a day (QID) | INTRAMUSCULAR | Status: DC

## 2017-02-24 NOTE — ED Notes (Signed)
Critical etoh of 475 called from lab. Dr. York Ceriseforbach notified no new orders received.

## 2017-02-24 NOTE — ED Provider Notes (Signed)
[facility] Emergency Department Provider Note  ____________________________________________   First MD Initiated Contact with Patient 02/24/17 2257     (approximate)  I have reviewed the triage vital signs and the nursing notes.   HISTORY  Chief Complaint Alcohol Intoxication and Psychiatric Evaluation  Level 5 caveat:  history/ROS limited by acute intoxication  HPI Kurt Horton is a 56 y.o. male with a long history of alcohol abuse and alcohol withdrawal symptoms as well as myasthenia gravis and diabetes who presents by EMS reportedly with suicidal ideation and heavy alcohol intoxication.  EMS reported that the patient drank a large quantity of ROM in front of them before he was transported to the ED.  The patient is tearful, awake and alert but clearly intoxicated.  He states that he thinks he is dying but is evasive about why he thinks that to be the case.  He is also evasive about answering whether or not he is considering suicide but he also states "I don't want to die".  He is unwilling or unable to provide much additional history, just stating that his alcoholism and that he drinks a large quantity of rum every day and he has withdrawn from alcohol before but he is not more specific.  He  has chronic back pain but denies having any new or different symptoms today.  Times are severe.  He has been to rehab before.   Past Medical History:  Diagnosis Date  . Alcohol abuse   . Alcohol withdrawal (HCC) 11/12/2016  . Diabetes mellitus without complication (HCC)   . DJD (degenerative joint disease)   . Hypertension   . Myasthenia gravis (HCC)   . Renal disorder     Patient Active Problem List   Diagnosis Date Noted  . Alcohol withdrawal (HCC) 11/13/2016  . Substance induced mood disorder (HCC) 11/13/2016  . SVT (supraventricular tachycardia) (HCC) 11/12/2016  . HTN (hypertension) 11/12/2016  . EtOH dependence (HCC) 11/12/2016  . Lumbar radiculopathy  08/24/2016  . Depression with anxiety 08/24/2016  . Chronic low back pain 01/04/2016  . Pressure ulcer 12/29/2015  . Osteomyelitis (HCC) 12/26/2015  . PVD (peripheral vascular disease) (HCC) 12/26/2015  . Type II diabetes mellitus with manifestations (HCC) 12/26/2015  . Myasthenia gravis (HCC) 12/26/2015    Past Surgical History:  Procedure Laterality Date  . AMPUTATION TOE Left 12/29/2015   Procedure: AMPUTATION TOE;  Surgeon: Gwyneth Revels, DPM;  Location: [facility] ORS;  Service: Podiatry;  Laterality: Left;  . PERIPHERAL VASCULAR CATHETERIZATION N/A 12/27/2015   Procedure: Lower Extremity Angiography;  Surgeon: Annice Needy, MD;  Location: [facility] INVASIVE CV LAB;  Service: Cardiovascular;  Laterality: N/A;    Prior to Admission medications   Medication Sig Start Date End Date Taking? Authorizing Provider  ALPRAZolam Prudy Feeler) 0.5 MG tablet Take 1 or 2 tablets 30 min. prior to MRI.  You must not drink alcohol, drive, or operate dangerous equipment after taking this medication. Patient not taking: Reported on 11/12/2016 08/30/16   Sater, Pearletha Furl, MD  citalopram (CELEXA) 20 MG tablet Take 1 tablet (20 mg total) by mouth daily. 08/24/16   Sater, Pearletha Furl, MD  clonazePAM (KLONOPIN) 0.5 MG tablet Take 1 tablet (0.5 mg total) by mouth 2 (two) times daily as needed for anxiety. Patient not taking: Reported on 08/24/2016 12/30/15   Alford Highland, MD  diltiazem (CARDIZEM CD) 120 MG 24 hr capsule Take 1 capsule (120 mg total) by mouth daily. 11/14/16 11/14/17  Delfino Lovett, MD  fentaNYL (DURAGESIC - DOSED MCG/HR) 50 MCG/HR Place 1 patch (50 mcg total) onto the skin every 3 (three) days. Patient not taking: Reported on 08/24/2016 01/04/16   Asa Lente, MD  furosemide (LASIX) 40 MG tablet Take 40 mg by mouth daily as needed for fluid (if taking prednisone).     [provider]  gabapentin (NEURONTIN) 300 MG capsule Take 2 capsules (600 mg total) by mouth 3 (three) times daily. 08/24/16   Sater,  Pearletha Furl, MD  lidocaine-prilocaine (EMLA) cream Apply 1 application topically as needed. Apply to feet twice daily.    [provider]  lisinopril (PRINIVIL,ZESTRIL) 10 MG tablet Take 1 tablet (10 mg total) by mouth daily. 12/30/15   Alford Highland, MD  metFORMIN (GLUCOPHAGE) 500 MG tablet Take 1 tablet (500 mg total) by mouth daily. 12/30/15   Alford Highland, MD  metoprolol tartrate (LOPRESSOR) 25 MG tablet Take 1 tablet (25 mg total) by mouth 2 (two) times daily. 11/14/16   Delfino Lovett, MD  mycophenolate (CELLCEPT) 500 MG tablet Take one pill in the morning and two pills at night. 11/17/16   Sater, Pearletha Furl, MD  oxyCODONE (OXY IR/ROXICODONE) 5 MG immediate release tablet Take 1 tablet (5 mg total) by mouth every 8 (eight) hours as needed. 10/11/16   Sater, Pearletha Furl, MD  predniSONE (DELTASONE) 10 MG tablet Take 6 po x 2d, then 5 po x 2d, then 4 po x 2d, then 3 po x 2d, then 2 po x 2d, then 1 po x 2d 11/17/16   Sater, Pearletha Furl, MD  SUMAtriptan (IMITREX) 100 MG tablet Take 1 tablet (100 mg total) by mouth every 2 (two) hours as needed for migraine. May repeat in 2 hours if headache persists or recurs. Patient not taking: Reported on 08/24/2016 12/30/15   Alford Highland, MD  zolpidem (AMBIEN) 10 MG tablet Take 1 tablet (10 mg total) by mouth at bedtime as needed for sleep. Patient not taking: Reported on 11/12/2016 12/30/15   Alford Highland, MD    Allergies Other  Family History  Problem Relation Age of Onset  . Rheum arthritis Father     Social History Social History  Substance Use Topics  . Smoking status: Never Smoker  . Smokeless tobacco: Never Used  . Alcohol use Yes     Comment: Pt states that last drink was today    Review of Systems Level 5 caveat:  history/ROS limited by acute intoxication ____________________________________________   PHYSICAL EXAM:  VITAL SIGNS: ED Triage Vitals  Enc Vitals Group     BP 02/24/17 2300 (!) 150/91     Pulse Rate 02/24/17 2245  (!) 101     Resp 02/24/17 2245 17     Temp --      Temp src --      SpO2 02/24/17 2245 91 %     Weight 02/24/17 2240 84.4 kg (186 lb)     Height 02/24/17 2240 1.88 m (6\' 2" )     Head Circumference --      Peak Flow --      Pain Score 02/24/17 2308 Asleep     Pain Loc --      Pain Edu? --      Excl. in GC? --     Constitutional: Alert and oriented but heavily intoxicated.  No acute distress Eyes: Conjunctivae are normal. PERRL. EOMI. Head: Atraumatic. Neck: No stridor.  No meningeal signs.   Cardiovascular: Borderline tachycardia, regular rhythm. Good peripheral circulation. Grossly  normal heart sounds. Respiratory: Normal respiratory effort.  No retractions. Lungs CTAB. Gastrointestinal: Soft and nontender. No distention.  Musculoskeletal: No lower extremity tenderness nor edema. No gross deformities of extremities. Neurologic: Slightly slurred speech with normal language. No gross focal neurologic deficits are appreciated.  Skin:  Skin is warm, dry and intact. No rash noted. Psychiatric: Mood and affect are labile, and that he will get angry briefly, then seem to be remorseful and will become tearful over his situation.  He is vague and will not directly answer the question about suicidality  ____________________________________________   LABS (all labs ordered are listed, but only abnormal results are displayed)  Labs Reviewed  COMPREHENSIVE METABOLIC PANEL - Abnormal; Notable for the following:       Result Value   Potassium 3.1 (*)    Chloride 99 (*)    Glucose, Bld 137 (*)    Calcium 8.5 (*)    AST 49 (*)    Anion gap 17 (*)    All other components within normal limits  ETHANOL - Abnormal; Notable for the following:    Alcohol, Ethyl (B) 475 (*)    All other components within normal limits  CBC - Abnormal; Notable for the following:    RBC 4.39 (*)    Hemoglobin 10.3 (*)    HCT 32.5 (*)    MCV 74.0 (*)    MCH 23.5 (*)    MCHC 31.7 (*)    RDW 20.7 (*)    All  other components within normal limits  URINE DRUG SCREEN, QUALITATIVE ([facility] ONLY) - Abnormal; Notable for the following:    Benzodiazepine, Ur Scrn POSITIVE (*)    All other components within normal limits  ACETAMINOPHEN LEVEL - Abnormal; Notable for the following:    Acetaminophen (Tylenol), Serum <10 (*)    All other components within normal limits  URINALYSIS, COMPLETE (UACMP) WITH MICROSCOPIC - Abnormal; Notable for the following:    Color, Urine YELLOW (*)    APPearance CLEAR (*)    Ketones, ur 5 (*)    Protein, ur 30 (*)    All other components within normal limits  SALICYLATE LEVEL   ____________________________________________  EKG  None - EKG not ordered by ED physician ____________________________________________  RADIOLOGY   No results found.  ____________________________________________   PROCEDURES  Critical Care performed: No   Procedure(s) performed:   Procedures   ____________________________________________   INITIAL IMPRESSION / ASSESSMENT AND PLAN / ED COURSE  As part of my medical decision making, I reviewed the following data within the electronic MEDICAL RECORD NUMBER Nursing notes reviewed and incorporated, Labs reviewed , Old chart reviewed and Notes from prior ED visits    I reviewed multiple prior notes in the medical record including a clinic visit which occurred about 2 weeks ago confirming his reported diagnosis of myasthenia gravis and diabetes.  Also reviewed prior ED notes and hospitalization notes from [facility] where he was admitted earlier this year for alcohol withdrawal and melena requiring blood transfusion.  We will place him under involuntary commitment as he does not have the capacity to make his own decisions and he reporting suicidal ideation prior to arrival.  I have ordered the CIWA protocol and have ordered thiamine and folate daily starting now.  He is on the monitor and has a one-to-one sitter given that he may decompensate and  lose his airway needs to be monitored closely at this time.  Based on his clinical status and lab results he may  require hospitalization but at this point I will keep him in the emergency department for observation and psychiatric consultation.  Clinical Course as of Feb 25 733  Sat Feb 24, 2017  2358 ETOH is 475, and the blood was drawn immediately after arrival in the ED.  He drank a large amount of rum in front of the paramedics that most likely is not yet being reflected in his serum ethanol level. Alcohol, Ethyl (B): (!!) 475 [CF]  Sun Feb 25, 2017  0734 Stable and awake enough to be able to talk to the telepsych consult.  He required some Ativan overnight for withdrawal symptoms as documented in the nursing notes.  Awaiting disposition plans.  [CF]    Clinical Course User Index [CF] Loleta RoseForbach, Dartanion Teo, MD    ____________________________________________  FINAL CLINICAL IMPRESSION(S) / ED DIAGNOSES  Final diagnoses:  Alcohol abuse  Acute alcoholic intoxication with complication (HCC)  Depression, unspecified depression type     MEDICATIONS GIVEN DURING THIS VISIT:  Medications  LORazepam (ATIVAN) injection 0-4 mg (not administered)    Or  LORazepam (ATIVAN) tablet 0-4 mg (not administered)  folic acid (FOLVITE) tablet 1 mg (1 mg Oral Given 02/24/17 2332)  thiamine (VITAMIN B-1) tablet 100 mg (100 mg Oral Given 02/24/17 2332)  LORazepam (ATIVAN) injection 0-4 mg (0 mg Intravenous Not Given 02/25/17 0345)    Or  LORazepam (ATIVAN) tablet 0-4 mg ( Oral See Alternative 02/25/17 0345)  LORazepam (ATIVAN) injection 2 mg (2 mg Intravenous Given 02/25/17 0600)     NEW OUTPATIENT MEDICATIONS STARTED DURING THIS VISIT:  New Prescriptions   No medications on file    Modified Medications   No medications on file    Discontinued Medications   No medications on file     Note:  This document was prepared using Dragon voice recognition software and may include unintentional  dictation errors.    Loleta RoseForbach, Ashaun Gaughan, MD 02/25/17 [phone]

## 2017-02-24 NOTE — ED Triage Notes (Signed)
Pt  Arrives via ems with etoh intoxication, low back pain, and states he is thinking about suicide. Pt then states "don't let me die". Pt tearful.per ems fsbs 150. Per ems pt drank a large quantity of rum in front of them before transport.

## 2017-02-24 NOTE — ED Notes (Signed)
[facility]

## 2017-02-24 NOTE — ED Notes (Signed)
MD at bedside. 

## 2017-02-24 NOTE — ED Notes (Signed)
Pt's belongings inventoried with officer mcadoo. $884 dollars secured and placed in a safe envelope to lock up and given to officer mcadoo. Pt's following belongings placed in two labeled belongings bag: one black carhart jacket with hood, on roll of toilet paper, two brown leather wallets, one silver metal colored ring, one pair of blue jeans, two brown leather flip flops, one blue t-shirt, two debit cards, one [employer] and one visa, 3 business cards, one home health care license card, one silver iphone shut off with clear plastic case.

## 2017-02-24 NOTE — ED Notes (Addendum)
Kurt RoysSarah EDNT is suicide 1:1 sitter, yellow socks, yellow arm band placed on pt.  Pt's money placed in safe by officer mcadoo, KEY #7 in [facility] for safe.

## 2017-02-25 DIAGNOSIS — I10 Essential (primary) hypertension: Secondary | ICD-10-CM | POA: Diagnosis present

## 2017-02-25 DIAGNOSIS — F1020 Alcohol dependence, uncomplicated: Secondary | ICD-10-CM

## 2017-02-25 DIAGNOSIS — F418 Other specified anxiety disorders: Secondary | ICD-10-CM | POA: Diagnosis present

## 2017-02-25 DIAGNOSIS — G4700 Insomnia, unspecified: Secondary | ICD-10-CM | POA: Diagnosis not present

## 2017-02-25 DIAGNOSIS — F1010 Alcohol abuse, uncomplicated: Secondary | ICD-10-CM

## 2017-02-25 DIAGNOSIS — G8929 Other chronic pain: Secondary | ICD-10-CM | POA: Diagnosis present

## 2017-02-25 DIAGNOSIS — I4891 Unspecified atrial fibrillation: Secondary | ICD-10-CM

## 2017-02-25 DIAGNOSIS — E1140 Type 2 diabetes mellitus with diabetic neuropathy, unspecified: Secondary | ICD-10-CM | POA: Diagnosis present

## 2017-02-25 DIAGNOSIS — Z7984 Long term (current) use of oral hypoglycemic drugs: Secondary | ICD-10-CM | POA: Diagnosis not present

## 2017-02-25 DIAGNOSIS — F10231 Alcohol dependence with withdrawal delirium: Secondary | ICD-10-CM

## 2017-02-25 DIAGNOSIS — F10239 Alcohol dependence with withdrawal, unspecified: Secondary | ICD-10-CM | POA: Diagnosis present

## 2017-02-25 DIAGNOSIS — I248 Other forms of acute ischemic heart disease: Secondary | ICD-10-CM | POA: Diagnosis not present

## 2017-02-25 DIAGNOSIS — Z881 Allergy status to other antibiotic agents status: Secondary | ICD-10-CM | POA: Diagnosis not present

## 2017-02-25 DIAGNOSIS — Z89422 Acquired absence of other left toe(s): Secondary | ICD-10-CM | POA: Diagnosis not present

## 2017-02-25 DIAGNOSIS — G7000 Myasthenia gravis without (acute) exacerbation: Secondary | ICD-10-CM | POA: Diagnosis present

## 2017-02-25 DIAGNOSIS — R45851 Suicidal ideations: Secondary | ICD-10-CM | POA: Diagnosis present

## 2017-02-25 DIAGNOSIS — F10929 Alcohol use, unspecified with intoxication, unspecified: Secondary | ICD-10-CM | POA: Diagnosis not present

## 2017-02-25 DIAGNOSIS — Z23 Encounter for immunization: Secondary | ICD-10-CM | POA: Diagnosis not present

## 2017-02-25 DIAGNOSIS — Y908 Blood alcohol level of 240 mg/100 ml or more: Secondary | ICD-10-CM | POA: Diagnosis present

## 2017-02-25 DIAGNOSIS — E876 Hypokalemia: Secondary | ICD-10-CM | POA: Diagnosis present

## 2017-02-25 DIAGNOSIS — I471 Supraventricular tachycardia: Secondary | ICD-10-CM | POA: Diagnosis not present

## 2017-02-25 DIAGNOSIS — E1151 Type 2 diabetes mellitus with diabetic peripheral angiopathy without gangrene: Secondary | ICD-10-CM | POA: Diagnosis present

## 2017-02-25 DIAGNOSIS — Z79899 Other long term (current) drug therapy: Secondary | ICD-10-CM | POA: Diagnosis not present

## 2017-02-25 DIAGNOSIS — Z7952 Long term (current) use of systemic steroids: Secondary | ICD-10-CM | POA: Diagnosis not present

## 2017-02-25 DIAGNOSIS — F329 Major depressive disorder, single episode, unspecified: Secondary | ICD-10-CM | POA: Diagnosis present

## 2017-02-25 DIAGNOSIS — R451 Restlessness and agitation: Secondary | ICD-10-CM | POA: Diagnosis not present

## 2017-02-25 DIAGNOSIS — I482 Chronic atrial fibrillation: Secondary | ICD-10-CM | POA: Diagnosis present

## 2017-02-25 LAB — PROTIME-INR
INR: 0.87
PROTHROMBIN TIME: 11.8 s (ref 11.4–15.2)

## 2017-02-25 LAB — COMPREHENSIVE METABOLIC PANEL
ALT: 26 U/L (ref 17–63)
ANION GAP: 15 (ref 5–15)
AST: 42 U/L — ABNORMAL HIGH (ref 15–41)
Albumin: 3.9 g/dL (ref 3.5–5.0)
Alkaline Phosphatase: 70 U/L (ref 38–126)
BUN: 12 mg/dL (ref 6–20)
CHLORIDE: 96 mmol/L — AB (ref 101–111)
CO2: 26 mmol/L (ref 22–32)
CREATININE: 0.77 mg/dL (ref 0.61–1.24)
Calcium: 8.8 mg/dL — ABNORMAL LOW (ref 8.9–10.3)
Glucose, Bld: 141 mg/dL — ABNORMAL HIGH (ref 65–99)
POTASSIUM: 3.1 mmol/L — AB (ref 3.5–5.1)
SODIUM: 137 mmol/L (ref 135–145)
Total Bilirubin: 1 mg/dL (ref 0.3–1.2)
Total Protein: 6.7 g/dL (ref 6.5–8.1)

## 2017-02-25 LAB — PHOSPHORUS: PHOSPHORUS: 3.6 mg/dL (ref 2.5–4.6)

## 2017-02-25 LAB — URINE DRUG SCREEN, QUALITATIVE (ARMC ONLY)
AMPHETAMINES, UR SCREEN: NOT DETECTED
BARBITURATES, UR SCREEN: NOT DETECTED
BENZODIAZEPINE, UR SCRN: POSITIVE — AB
Cannabinoid 50 Ng, Ur ~~LOC~~: NOT DETECTED
Cocaine Metabolite,Ur ~~LOC~~: NOT DETECTED
MDMA (Ecstasy)Ur Screen: NOT DETECTED
METHADONE SCREEN, URINE: NOT DETECTED
Opiate, Ur Screen: NOT DETECTED
Phencyclidine (PCP) Ur S: NOT DETECTED
TRICYCLIC, UR SCREEN: NOT DETECTED

## 2017-02-25 LAB — GLUCOSE, CAPILLARY
GLUCOSE-CAPILLARY: 143 mg/dL — AB (ref 65–99)
GLUCOSE-CAPILLARY: 155 mg/dL — AB (ref 65–99)
GLUCOSE-CAPILLARY: 148 mg/dL — AB (ref 65–99)

## 2017-02-25 LAB — URINALYSIS, COMPLETE (UACMP) WITH MICROSCOPIC
BILIRUBIN URINE: NEGATIVE
Bacteria, UA: NONE SEEN
GLUCOSE, UA: NEGATIVE mg/dL
HGB URINE DIPSTICK: NEGATIVE
Ketones, ur: 5 mg/dL — AB
LEUKOCYTES UA: NEGATIVE
NITRITE: NEGATIVE
PH: 6 (ref 5.0–8.0)
Protein, ur: 30 mg/dL — AB
RBC / HPF: NONE SEEN RBC/hpf (ref 0–5)
Specific Gravity, Urine: 1.008 (ref 1.005–1.030)
Squamous Epithelial / HPF: NONE SEEN

## 2017-02-25 LAB — MRSA PCR SCREENING: MRSA BY PCR: NEGATIVE

## 2017-02-25 LAB — TROPONIN I
Troponin I: 0.03 ng/mL (ref <0.03)
Troponin I: 0.03 ng/mL (ref <0.03)

## 2017-02-25 LAB — APTT: APTT: 26 s (ref 24–36)

## 2017-02-25 LAB — HEPARIN LEVEL (UNFRACTIONATED): HEPARIN UNFRACTIONATED: 0.65 [IU]/mL (ref 0.30–0.70)

## 2017-02-25 LAB — CBC
HCT: 33.1 % — ABNORMAL LOW (ref 40.0–52.0)
HEMOGLOBIN: 10.1 g/dL — AB (ref 13.0–18.0)
MCH: 22.6 pg — AB (ref 26.0–34.0)
MCHC: 30.5 g/dL — ABNORMAL LOW (ref 32.0–36.0)
MCV: 74 fL — ABNORMAL LOW (ref 80.0–100.0)
PLATELETS: 237 10*3/uL (ref 150–440)
RBC: 4.47 MIL/uL (ref 4.40–5.90)
RDW: 21 % — ABNORMAL HIGH (ref 11.5–14.5)
WBC: 5.6 10*3/uL (ref 3.8–10.6)

## 2017-02-25 LAB — MAGNESIUM
Magnesium: 2 mg/dL (ref 1.7–2.4)
Magnesium: 1.4 mg/dL — ABNORMAL LOW (ref 1.7–2.4)

## 2017-02-25 MED ORDER — METOPROLOL TARTRATE 5 MG/5ML IV SOLN
5.0000 mg | Freq: Four times a day (QID) | INTRAVENOUS | Status: DC
  Administered 2017-02-25: 5 mg via INTRAVENOUS
  Filled 2017-02-25 (×2): qty 5

## 2017-02-25 MED ORDER — LORAZEPAM 2 MG/ML IJ SOLN
2.0000 mg | Freq: Once | INTRAMUSCULAR | Status: AC
  Administered 2017-02-25: 2 mg via INTRAVENOUS

## 2017-02-25 MED ORDER — GABAPENTIN 300 MG PO CAPS
600.0000 mg | ORAL_CAPSULE | Freq: Three times a day (TID) | ORAL | Status: DC
  Administered 2017-02-25 – 2017-03-01 (×11): 600 mg via ORAL
  Filled 2017-02-25 (×11): qty 2

## 2017-02-25 MED ORDER — KETOROLAC TROMETHAMINE 30 MG/ML IJ SOLN
30.0000 mg | Freq: Once | INTRAMUSCULAR | Status: AC
  Administered 2017-02-25: 30 mg via INTRAMUSCULAR
  Filled 2017-02-25: qty 1

## 2017-02-25 MED ORDER — ACETAMINOPHEN 650 MG RE SUPP: 650.0000 mg | Freq: Four times a day (QID) | RECTAL | Status: DC | PRN

## 2017-02-25 MED ORDER — POTASSIUM CHLORIDE CRYS ER 20 MEQ PO TBCR
20.0000 meq | EXTENDED_RELEASE_TABLET | Freq: Two times a day (BID) | ORAL | Status: AC
  Administered 2017-02-25 (×2): 20 meq via ORAL
  Filled 2017-02-25 (×2): qty 1

## 2017-02-25 MED ORDER — INSULIN ASPART 100 UNIT/ML ~~LOC~~ SOLN
0.0000 [IU] | Freq: Three times a day (TID) | SUBCUTANEOUS | Status: DC
  Administered 2017-02-25 – 2017-02-26 (×2): 2 [IU] via SUBCUTANEOUS
  Administered 2017-02-26: 1 [IU] via SUBCUTANEOUS
  Filled 2017-02-25 (×3): qty 1

## 2017-02-25 MED ORDER — ACETAMINOPHEN 325 MG PO TABS: 650.0000 mg | ORAL_TABLET | Freq: Four times a day (QID) | ORAL | Status: DC | PRN

## 2017-02-25 MED ORDER — DILTIAZEM HCL 100 MG IV SOLR
10.0000 mg | Freq: Once | INTRAVENOUS | Status: DC
  Administered 2017-02-25: 10 mg via INTRAVENOUS

## 2017-02-25 MED ORDER — INSULIN ASPART 100 UNIT/ML ~~LOC~~ SOLN: 0.0000 [IU] | Freq: Every day | SUBCUTANEOUS | Status: DC

## 2017-02-25 MED ORDER — LORAZEPAM 2 MG/ML IJ SOLN
INTRAMUSCULAR | Status: AC
  Filled 2017-02-25: qty 1

## 2017-02-25 MED ORDER — DILTIAZEM HCL 100 MG IV SOLR
5.0000 mg/h | INTRAVENOUS | Status: DC
  Administered 2017-02-25: 15 mg/h via INTRAVENOUS
  Administered 2017-02-26: 5 mg/h via INTRAVENOUS
  Filled 2017-02-25 (×3): qty 100

## 2017-02-25 MED ORDER — CITALOPRAM HYDROBROMIDE 20 MG PO TABS
20.0000 mg | ORAL_TABLET | Freq: Every day | ORAL | Status: DC
  Administered 2017-02-25 – 2017-03-01 (×5): 20 mg via ORAL
  Filled 2017-02-25 (×5): qty 1

## 2017-02-25 MED ORDER — DILTIAZEM HCL 100 MG IV SOLR
5.0000 mg/h | Freq: Once | INTRAVENOUS | Status: DC
  Filled 2017-02-25: qty 100

## 2017-02-25 MED ORDER — SODIUM CHLORIDE 0.9 % IV BOLUS (SEPSIS)
1000.0000 mL | Freq: Once | INTRAVENOUS | Status: AC
  Administered 2017-02-25: 1000 mL via INTRAVENOUS

## 2017-02-25 MED ORDER — ADENOSINE 6 MG/2ML IV SOLN
INTRAVENOUS | Status: AC
  Filled 2017-02-25: qty 2

## 2017-02-25 MED ORDER — LISINOPRIL 10 MG PO TABS
10.0000 mg | ORAL_TABLET | Freq: Every day | ORAL | Status: DC
  Administered 2017-02-25 – 2017-03-01 (×4): 10 mg via ORAL
  Filled 2017-02-25 (×4): qty 2
  Filled 2017-02-25: qty 1

## 2017-02-25 MED ORDER — VITAMIN B-12 1000 MCG PO TABS
1000.0000 ug | ORAL_TABLET | Freq: Every day | ORAL | Status: DC
  Administered 2017-02-25 – 2017-03-01 (×5): 1000 ug via ORAL
  Filled 2017-02-25 (×5): qty 1

## 2017-02-25 MED ORDER — OXYCODONE-ACETAMINOPHEN 5-325 MG PO TABS
1.0000 | ORAL_TABLET | Freq: Four times a day (QID) | ORAL | Status: DC | PRN
  Administered 2017-02-25 – 2017-02-27 (×5): 1 via ORAL
  Administered 2017-02-27: 2 via ORAL
  Administered 2017-02-27: 1 via ORAL
  Administered 2017-02-28 – 2017-03-01 (×5): 2 via ORAL
  Filled 2017-02-25: qty 2
  Filled 2017-02-25 (×4): qty 1
  Filled 2017-02-25: qty 2
  Filled 2017-02-25: qty 1
  Filled 2017-02-25: qty 2
  Filled 2017-02-25: qty 1
  Filled 2017-02-25 (×4): qty 2

## 2017-02-25 MED ORDER — METOPROLOL TARTRATE 5 MG/5ML IV SOLN
5.0000 mg | Freq: Once | INTRAVENOUS | Status: AC
  Administered 2017-02-25: 5 mg via INTRAVENOUS
  Filled 2017-02-25: qty 5

## 2017-02-25 MED ORDER — FUROSEMIDE 20 MG PO TABS: 40.0000 mg | ORAL_TABLET | Freq: Every day | ORAL | Status: DC | PRN

## 2017-02-25 MED ORDER — ONDANSETRON HCL 4 MG PO TABS: 4.0000 mg | ORAL_TABLET | Freq: Four times a day (QID) | ORAL | Status: DC | PRN

## 2017-02-25 MED ORDER — DILTIAZEM HCL 100 MG IV SOLR
5.0000 mg/h | Freq: Once | INTRAVENOUS | Status: AC
  Administered 2017-02-25: 10 mg/h via INTRAVENOUS

## 2017-02-25 MED ORDER — ADENOSINE 12 MG/4ML IV SOLN
INTRAVENOUS | Status: AC
  Filled 2017-02-25: qty 4

## 2017-02-25 MED ORDER — PNEUMOCOCCAL VAC POLYVALENT 25 MCG/0.5ML IJ INJ: 0.5000 mL | INJECTION | INTRAMUSCULAR | Status: DC

## 2017-02-25 MED ORDER — ADENOSINE 12 MG/4ML IV SOLN
12.0000 mg | Freq: Once | INTRAVENOUS | Status: AC
  Administered 2017-02-25: 12 mg via INTRAVENOUS

## 2017-02-25 MED ORDER — HEPARIN (PORCINE) IN NACL 100-0.45 UNIT/ML-% IJ SOLN
1350.0000 [IU]/h | INTRAMUSCULAR | Status: DC
  Administered 2017-02-25 – 2017-02-26 (×3): 1350 [IU]/h via INTRAVENOUS
  Filled 2017-02-25 (×3): qty 250

## 2017-02-25 MED ORDER — VITAMIN B-12 1000 MCG PO TABS: 1000.0000 ug | ORAL_TABLET | Freq: Every day | ORAL | Status: DC

## 2017-02-25 MED ORDER — ADENOSINE 6 MG/2ML IV SOLN
6.0000 mg | Freq: Once | INTRAVENOUS | Status: AC
  Administered 2017-02-25: 6 mg via INTRAVENOUS

## 2017-02-25 MED ORDER — DILTIAZEM HCL 25 MG/5ML IV SOLN
10.0000 mg | Freq: Once | INTRAVENOUS | Status: AC
  Administered 2017-02-25: 10 mg via INTRAVENOUS
  Filled 2017-02-25: qty 5

## 2017-02-25 MED ORDER — ONDANSETRON HCL 4 MG/2ML IJ SOLN: 4.0000 mg | Freq: Four times a day (QID) | INTRAMUSCULAR | Status: DC | PRN

## 2017-02-25 MED ORDER — DEXMEDETOMIDINE HCL IN NACL 400 MCG/100ML IV SOLN
0.0000 ug/kg/h | INTRAVENOUS | Status: DC
  Administered 2017-02-25: 0.4 ug/kg/h via INTRAVENOUS
  Administered 2017-02-26: 0.8 ug/kg/h via INTRAVENOUS
  Administered 2017-02-26: 0.4 ug/kg/h via INTRAVENOUS
  Administered 2017-02-26: 0.6 ug/kg/h via INTRAVENOUS
  Filled 2017-02-25 (×4): qty 100

## 2017-02-25 MED ORDER — HEPARIN BOLUS VIA INFUSION
4250.0000 [IU] | Freq: Once | INTRAVENOUS | Status: AC
  Administered 2017-02-25: 4250 [IU] via INTRAVENOUS
  Filled 2017-02-25: qty 4250

## 2017-02-25 NOTE — ED Notes (Signed)
TTS at bedside. 

## 2017-02-25 NOTE — Progress Notes (Signed)
CH responded to an OR for suicidal thoughts. RN stated that the Pt denies suicidal thoughts. Pt presented groggy from high blood alcohol levels and medication. CH provided opportunity for conversation but Pt drifted in and out of sleep. CH assessed a later visit would be more productive.      02/25/17 1700  Clinical Encounter Type  Visited With Patient;Health care provider  Visit Type Initial;Spiritual support  Referral From Nurse  Consult/Referral To Chaplain  Spiritual Encounters  Spiritual Needs Emotional

## 2017-02-25 NOTE — Consult Note (Signed)
ANTICOAGULATION CONSULT NOTE - Initial Consult  Pharmacy Consult for heparin drip Indication: atrial fibrillation  Allergies  Allergen Reactions  . Other Other (See Comments)    Patient states he can't take any "mycin" medications because myasthenia gravis.    Patient Measurements: Height: 6\' 2"  (188 cm) Weight: 186 lb (84.4 kg) IBW/kg (Calculated) : 82.2 Heparin Dosing Weight: 84.4kg  Vital Signs: BP: 139/73 (10/21 1330) Pulse Rate: 119 (10/21 1330)  Labs:  Recent Labs  02/24/17 2244  HGB 10.3*  HCT 32.5*  PLT 286  CREATININE 0.78    Estimated Creatinine Clearance: 119.9 mL/min (by C-G formula based on SCr of 0.78 mg/dL).   Medical History: Past Medical History:  Diagnosis Date  . Alcohol abuse   . Alcohol withdrawal (HCC) 11/12/2016  . Diabetes mellitus without complication (HCC)   . DJD (degenerative joint disease)   . Hypertension   . Myasthenia gravis (HCC)   . Renal disorder     Medications:  Scheduled:  . citalopram  20 mg Oral Daily  . folic acid  1 mg Oral Daily  . gabapentin  600 mg Oral TID  . insulin aspart  0-5 Units Subcutaneous QHS  . insulin aspart  0-9 Units Subcutaneous TID WC  . lisinopril  10 mg Oral Daily  . [START ON 02/27/2017] LORazepam  0-4 mg Intravenous Q12H   Or  . [START ON 02/27/2017] LORazepam  0-4 mg Oral Q12H  . LORazepam  0-4 mg Intravenous Q6H   Or  . LORazepam  0-4 mg Oral Q6H  . metoprolol tartrate  5 mg Intravenous Q6H  . potassium chloride  20 mEq Oral BID  . thiamine  100 mg Oral Daily    Assessment: Patient is a 56 year old male w/ known alcohol abuse admitted to hospital for alcohol detox. Pt developed SVT w/ HR in the 180s. Pt then noted to be in afib and placed on a diltiazem drip. Pharmacy consulted to dose heparin drip  Goal of Therapy:  Heparin level 0.3-0.7 units/ml Monitor platelets by anticoagulation protocol: Yes   Plan:  Give 4250 units bolus x 1 Start heparin infusion at 1350  units/hr Check anti-Xa level in 6 hours and daily while on heparin Continue to monitor H&H and platelets  Long Brimage D Brendy Ficek, Pharm.D, BCPS Clinical Pharmacist  02/25/2017,2:19 PM

## 2017-02-25 NOTE — ED Notes (Signed)
Pt awake, sweaty, pt with tremors noted to bilateral arms. Hr up to 144. Will dose with ativan.

## 2017-02-25 NOTE — ED Notes (Signed)
Walked into pt room due to cords not connected to reconnect and recycle VS. Pt in assumed SVT per monitor. EKG completed and MD notified. Verbal orders obtained. Pt placed on code card pads.

## 2017-02-25 NOTE — ED Notes (Signed)
Report to hunter, rn.  

## 2017-02-25 NOTE — ED Notes (Signed)
Pt continues to sleep. Sitter at side.

## 2017-02-25 NOTE — Consult Note (Addendum)
[facility] Psychiatry Consult   Reason for Consult:  SI Referring Physician:  Dr. Verdell Carmine Patient Identification: Kurt Horton MRN:  [id number] Principal Diagnosis: <principal problem not specified> Diagnosis:   Patient Active Problem List   Diagnosis Date Noted  . Atrial fibrillation with RVR ([place]) [I48.91] 02/25/2017  . Alcohol withdrawal ([place]) [F10.239] 11/13/2016  . Substance induced mood disorder ([place]) [F19.94] 11/13/2016  . SVT (supraventricular tachycardia) ([place]) [I47.1] 11/12/2016  . HTN (hypertension) [I10] 11/12/2016  . EtOH dependence ([place]) [F10.20] 11/12/2016  . Lumbar radiculopathy [M54.16] 08/24/2016  . Depression with anxiety [F41.8] 08/24/2016  . Chronic low back pain [M54.5, G89.29] 01/04/2016  . Pressure ulcer [L89.90] 12/29/2015  . Osteomyelitis ([place]) [M86.9] 12/26/2015  . PVD (peripheral vascular disease) ([place]) [I73.9] 12/26/2015  . Type II diabetes mellitus with manifestations ([place]) [E11.8] 12/26/2015  . Myasthenia gravis ([place]) [G70.00] 12/26/2015    Total Time spent with patient: 45 minutes  Subjective:   CASIMIRO Horton is a 56 y.o. male w/ hx of alcohol abuse and other medical problems patient admitted with alcoholism, SI.  HPI:    On 10/20 EMS called to pt, he drank a large quantity of rum in front of them, started crying and saying he was thinking about suicide and said "don't let me die."  BAL on arrival was 475.  Began having irregular heart rhythm, a fib, possibly due to alcohol withdrawal and sent to MICU for further management.    Seen in MICU.  Patient is alert and oriented to place, location, situation.  He is a little confused by day of the week and recalling what he has been up to in the past month.  He states he wants to stop drinking.  Went to [place] for alcholism on 10/5? And was there for about 2 weeks, returned home about 7-10 days go and reports resuming drinking 3-4 days ago.  He says he just has a "few drinks" and "keeps some around."   He denies other drug use.  He isn't sure what contributed to his relapse.  He lives with his parents.  He does not have an outpatient psychiatrist.  He was supposed to start [facility] for [place] in the upcoming week (or possibly already, pt is a poor historian).  He states he underwent alcohol withdrawal at [facility] end of Sept/Oct "they gave me some stuff that helped me."  Denies hx of seizures but pt is a poor historian.   Patient states he isn't suicidal and "I must have been out of my mind."  Denies hx of suicide attempts.   Labs In ER urine was benzo positive but I'm not sure if EMS/hospital provided any. AST mildly elevated.  ALT wnl.     Past Psychiatric History:  outpatient meds - celexa 26m according to chart but pt could not confirm Treatment - did [facility] inpt for 14 days in October 2018, [facility] [employer] [facility]  [facility] other [facility] but pt cannot recall at this tiem  Past meds - clonazepam, ambien 187mRisk to Self: Suicidal Ideation: No Suicidal Intent: No Is patient at risk for suicide?: No Suicidal Plan?: No Access to Means: No What has been your use of drugs/alcohol within the last 12 months?: Alcohol How many times?: 0 Other Self Harm Risks: Active Addiction Triggers for Past Attempts: None known Intentional Self Injurious Behavior: None Risk to Others: Homicidal Ideation: No Thoughts of Harm  to Others: No Current Homicidal Intent: No Current Homicidal Plan: No Access to Homicidal Means: No Identified Victim: Reports of none History of harm to others?: No Assessment of Violence: None Noted Violent Behavior Description: Reports of none Does patient have access to weapons?: No Criminal Charges Pending?: No Does patient have a court date: No Prior Inpatient Therapy: Prior Inpatient Therapy: Yes Prior Therapy Dates: 02/2017 Prior Therapy  Facilty/Provider(s): [facility]  Reason for Treatment: Alcohol Use Use Prior Outpatient Therapy: Prior Outpatient Therapy: Yes Prior Therapy Dates: Current Prior Therapy Facilty/Provider(s): [facility] Reason for Treatment: SAIOP Does patient have an ACCT team?: No Does patient have Intensive In-House Services?  : No Does patient have [facility] services? : No Does patient have P4CC services?: No  Past Medical History:  Past Medical History:  Diagnosis Date  . Alcohol abuse   . Alcohol withdrawal ([place]) 11/12/2016  . Diabetes mellitus without complication ([place])   . DJD (degenerative joint disease)   . Hypertension   . Myasthenia gravis ([place])   . Renal disorder     Past Surgical History:  Procedure Laterality Date  . AMPUTATION TOE Left 12/29/2015   Procedure: AMPUTATION TOE;  Surgeon: Samara Deist, DPM;  Location: [facility] ORS;  Service: Podiatry;  Laterality: Left;  . PERIPHERAL VASCULAR CATHETERIZATION N/A 12/27/2015   Procedure: Lower Extremity Angiography;  Surgeon: Algernon Huxley, MD;  Location: [facility];  Service: Cardiovascular;  Laterality: N/A;   Family History:  Family History  Problem Relation Age of Onset  . Rheum arthritis Father    Family Psychiatric  History: unknown Social History:  History  Alcohol Use  . Yes    Comment: Pt states that last drink was today     History  Drug use: Unknown    Social History   Social History  . Marital status: Legally Separated    Spouse name: N/A  . Number of children: N/A  . Years of education: N/A   Social History Main Topics  . Smoking status: Never Smoker  . Smokeless tobacco: Never Used  . Alcohol use Yes     Comment: Pt states that last drink was today  . Drug use: Unknown  . Sexual activity: Not Asked   Other Topics Concern  . None   Social History Narrative  . None   Additional Social History:    Got master's degree at [employer].  Lives with mom and dad  Allergies:   Allergies   Allergen Reactions  . Other Other (See Comments)    Patient states he can't take any "mycin" medications because myasthenia gravis.    Labs:  Results for orders placed or performed during the hospital encounter of 02/24/17 (from the past 48 hour(s))  Comprehensive metabolic panel     Status: Abnormal   Collection Time: 02/24/17 10:44 PM  Result Value Ref Range   Sodium 139 135 - 145 mmol/L   Potassium 3.1 (L) 3.5 - 5.1 mmol/L   Chloride 99 (L) 101 - 111 mmol/L   CO2 23 22 - 32 mmol/L   Glucose, Bld 137 (H) 65 - 99 mg/dL   BUN 10 6 - 20 mg/dL   Creatinine, Ser 0.78 0.61 - 1.24 mg/dL   Calcium 8.5 (L) 8.9 - 10.3 mg/dL   Total Protein 7.0 6.5 - 8.1 g/dL   Albumin 4.0 3.5 - 5.0 g/dL   AST 49 (H) 15 - 41 U/L   ALT 28 17 - 63 U/L   Alkaline Phosphatase 77  38 - 126 U/L   Total Bilirubin 0.6 0.3 - 1.2 mg/dL   GFR calc non Af Amer >60 >60 mL/min   GFR calc Af Amer >60 >60 mL/min    Comment: (NOTE) The eGFR has been calculated using the CKD EPI equation. This calculation has not been validated in all clinical situations. eGFR's persistently <60 mL/min signify possible Chronic Kidney Disease.    Anion gap 17 (H) 5 - 15  Ethanol     Status: Abnormal   Collection Time: 02/24/17 10:44 PM  Result Value Ref Range   Alcohol, Ethyl (B) 475 (HH) <10 mg/dL    Comment: CRITICAL RESULT CALLED TO, READ BACK BY AND VERIFIED WITH APRIL BRUMGARD AT 2348 02/24/17.PMH        LOWEST DETECTABLE LIMIT FOR SERUM ALCOHOL IS 10 mg/dL FOR MEDICAL PURPOSES ONLY   cbc     Status: Abnormal   Collection Time: 02/24/17 10:44 PM  Result Value Ref Range   WBC 5.3 3.8 - 10.6 K/uL   RBC 4.39 (L) 4.40 - 5.90 MIL/uL   Hemoglobin 10.3 (L) 13.0 - 18.0 g/dL   HCT 32.5 (L) 40.0 - 52.0 %   MCV 74.0 (L) 80.0 - 100.0 fL   MCH 23.5 (L) 26.0 - 34.0 pg   MCHC 31.7 (L) 32.0 - 36.0 g/dL   RDW 20.7 (H) 11.5 - 14.5 %   Platelets 286 820 - 601 K/uL  Salicylate level     Status: None   Collection Time: 02/24/17 10:44 PM   Result Value Ref Range   Salicylate Lvl <5.6 2.8 - 30.0 mg/dL  Acetaminophen level     Status: Abnormal   Collection Time: 02/24/17 10:44 PM  Result Value Ref Range   Acetaminophen (Tylenol), Serum <10 (L) 10 - 30 ug/mL    Comment:        THERAPEUTIC CONCENTRATIONS VARY SIGNIFICANTLY. A RANGE OF 10-30 ug/mL MAY BE AN EFFECTIVE CONCENTRATION FOR MANY PATIENTS. HOWEVER, SOME ARE BEST TREATED AT CONCENTRATIONS OUTSIDE THIS RANGE. ACETAMINOPHEN CONCENTRATIONS >150 ug/mL AT 4 HOURS AFTER INGESTION AND >50 ug/mL AT 12 HOURS AFTER INGESTION ARE OFTEN ASSOCIATED WITH TOXIC REACTIONS.   Magnesium     Status: None   Collection Time: 02/24/17 10:44 PM  Result Value Ref Range   Magnesium 2.0 1.7 - 2.4 mg/dL  Urine Drug Screen, Qualitative     Status: Abnormal   Collection Time: 02/25/17  4:53 AM  Result Value Ref Range   Tricyclic, Ur Screen NONE DETECTED NONE DETECTED   Amphetamines, Ur Screen NONE DETECTED NONE DETECTED   MDMA (Ecstasy)Ur Screen NONE DETECTED NONE DETECTED   Cocaine Metabolite,Ur [place] NONE DETECTED NONE DETECTED   Opiate, Ur Screen NONE DETECTED NONE DETECTED   Phencyclidine (PCP) Ur S NONE DETECTED NONE DETECTED   Cannabinoid 50 Ng, Ur  NONE DETECTED NONE DETECTED   Barbiturates, Ur Screen NONE DETECTED NONE DETECTED   Benzodiazepine, Ur Scrn POSITIVE (A) NONE DETECTED   Methadone Scn, Ur NONE DETECTED NONE DETECTED    Comment: (NOTE) 153  Tricyclics, urine               Cutoff 1000 ng/mL 200  Amphetamines, urine             Cutoff 1000 ng/mL 300  MDMA (Ecstasy), urine           Cutoff 500 ng/mL 400  Cocaine Metabolite, urine       Cutoff 300 ng/mL 500  Opiate, urine  Cutoff 300 ng/mL 600  Phencyclidine (PCP), urine      Cutoff 25 ng/mL 700  Cannabinoid, urine              Cutoff 50 ng/mL 800  Barbiturates, urine             Cutoff 200 ng/mL 900  Benzodiazepine, urine           Cutoff 200 ng/mL 1000 Methadone, urine                Cutoff 300  ng/mL 1100 1200 The urine drug screen provides only a preliminary, unconfirmed 1300 analytical test result and should not be used for non-medical 1400 purposes. Clinical consideration and professional judgment should 1500 be applied to any positive drug screen result due to possible 1600 interfering substances. A more specific alternate chemical method 1700 must be used in order to obtain a confirmed analytical result.  1800 Gas chromato graphy / mass spectrometry (GC/MS) is the preferred 1900 confirmatory method.   Urinalysis, Complete w Microscopic     Status: Abnormal   Collection Time: 02/25/17  4:53 AM  Result Value Ref Range   Color, Urine YELLOW (A) YELLOW   APPearance CLEAR (A) CLEAR   Specific Gravity, Urine 1.008 1.005 - 1.030   pH 6.0 5.0 - 8.0   Glucose, UA NEGATIVE NEGATIVE mg/dL   Hgb urine dipstick NEGATIVE NEGATIVE   Bilirubin Urine NEGATIVE NEGATIVE   Ketones, ur 5 (A) NEGATIVE mg/dL   Protein, ur 30 (A) NEGATIVE mg/dL   Nitrite NEGATIVE NEGATIVE   Leukocytes, UA NEGATIVE NEGATIVE   RBC / HPF NONE SEEN 0 - 5 RBC/hpf   WBC, UA 0-5 0 - 5 WBC/hpf   Bacteria, UA NONE SEEN NONE SEEN   Squamous Epithelial / LPF NONE SEEN NONE SEEN   Mucus PRESENT   MRSA PCR Screening     Status: None   Collection Time: 02/25/17  2:23 PM  Result Value Ref Range   MRSA by PCR NEGATIVE NEGATIVE    Comment:        The GeneXpert MRSA Assay (FDA approved for NASAL specimens only), is one component of a comprehensive MRSA colonization surveillance program. It is not intended to diagnose MRSA infection nor to guide or monitor treatment for MRSA infections.   Glucose, capillary     Status: Abnormal   Collection Time: 02/25/17  2:26 PM  Result Value Ref Range   Glucose-Capillary 143 (H) 65 - 99 mg/dL  CBC     Status: Abnormal   Collection Time: 02/25/17  2:44 PM  Result Value Ref Range   WBC 5.6 3.8 - 10.6 K/uL   RBC 4.47 4.40 - 5.90 MIL/uL   Hemoglobin 10.1 (L) 13.0 - 18.0  g/dL   HCT 33.1 (L) 40.0 - 52.0 %   MCV 74.0 (L) 80.0 - 100.0 fL   MCH 22.6 (L) 26.0 - 34.0 pg   MCHC 30.5 (L) 32.0 - 36.0 g/dL   RDW 21.0 (H) 11.5 - 14.5 %   Platelets 237 150 - 440 K/uL  APTT     Status: None   Collection Time: 02/25/17  2:44 PM  Result Value Ref Range   aPTT 26 24 - 36 seconds  Protime-INR     Status: None   Collection Time: 02/25/17  2:44 PM  Result Value Ref Range   Prothrombin Time 11.8 11.4 - 15.2 seconds   INR 0.87   Troponin I     Status: Abnormal  Collection Time: 02/25/17  2:44 PM  Result Value Ref Range   Troponin I 0.03 (HH) <0.03 ng/mL    Comment: CRITICAL RESULT CALLED TO, READ BACK BY AND VERIFIED WITH SANDRA VORBA ON 02/25/17 [place] 1529 [place]     Current Facility-Administered Medications  Medication Dose Route Frequency Provider Last Rate Last Dose  . acetaminophen (TYLENOL) tablet 650 mg  650 mg Oral Q6H PRN Henreitta Leber, MD       Or  . acetaminophen (TYLENOL) suppository 650 mg  650 mg Rectal Q6H PRN Henreitta Leber, MD      . citalopram (CELEXA) tablet 20 mg  20 mg Oral Daily Henreitta Leber, MD   20 mg at 02/25/17 1522  . diltiazem (CARDIZEM) 100 mg in dextrose 5 % 100 mL (1 mg/mL) infusion  5-15 mg/hr Intravenous Continuous Henreitta Leber, MD 15 mL/hr at 02/25/17 1435 15 mg/hr at 02/25/17 1435  . folic acid (FOLVITE) tablet 1 mg  1 mg Oral Daily Hinda Kehr, MD   1 mg at 02/25/17 1020  . furosemide (LASIX) tablet 40 mg  40 mg Oral Daily PRN Henreitta Leber, MD      . gabapentin (NEURONTIN) capsule 600 mg  600 mg Oral TID Henreitta Leber, MD   600 mg at 02/25/17 1519  . heparin ADULT infusion 100 units/mL (25000 units/261m sodium chloride 0.45%)  1,350 Units/hr Intravenous Continuous MMarcelle OverlieD, RPH 13.5 mL/hr at 02/25/17 1519 1,350 Units/hr at 02/25/17 1519  . insulin aspart (novoLOG) injection 0-5 Units  0-5 Units Subcutaneous QHS Sainani, Vivek J, MD      . insulin aspart (novoLOG) injection 0-9 Units  0-9 Units  Subcutaneous TID WC SHenreitta Leber MD   2 Units at 02/25/17 1632  . lisinopril (PRINIVIL,ZESTRIL) tablet 10 mg  10 mg Oral Daily SHenreitta Leber MD   10 mg at 02/25/17 1520  . [START ON 02/27/2017] LORazepam (ATIVAN) injection 0-4 mg  0-4 mg Intravenous Q12H FHinda Kehr MD       Or  . [Derrill MemoON 02/27/2017] LORazepam (ATIVAN) tablet 0-4 mg  0-4 mg Oral Q12H FHinda Kehr MD      . LORazepam (ATIVAN) injection 0-4 mg  0-4 mg Intravenous Q6H FHinda Kehr MD   1 mg at 02/25/17 0844   Or  . LORazepam (ATIVAN) tablet 0-4 mg  0-4 mg Oral Q6H FHinda Kehr MD   2 mg at 02/25/17 1020  . metoprolol tartrate (LOPRESSOR) injection 5 mg  5 mg Intravenous Q6H SHenreitta Leber MD   5 mg at 02/25/17 1522  . ondansetron (ZOFRAN) tablet 4 mg  4 mg Oral Q6H PRN SHenreitta Leber MD       Or  . ondansetron (ZOFRAN) injection 4 mg  4 mg Intravenous Q6H PRN SHenreitta Leber MD      . oxyCODONE-acetaminophen (PERCOCET/ROXICET) 5-325 MG per tablet 1-2 tablet  1-2 tablet Oral Q6H PRN SWilhelmina Mcardle MD   1 tablet at 02/25/17 1632  . [START ON 02/26/2017] pneumococcal 23 valent vaccine (PNU-IMMUNE) injection 0.5 mL  0.5 mL Intramuscular Tomorrow-1000 SMerton BorderB, MD      . potassium chloride SA (K-DUR,KLOR-CON) CR tablet 20 mEq  20 mEq Oral BID SHenreitta Leber MD   20 mEq at 02/25/17 1520  . thiamine (VITAMIN B-1) tablet 100 mg  100 mg Oral Daily FHinda Kehr MD   100 mg at 02/25/17 1019    Musculoskeletal: Strength & Muscle Tone:  within normal limits Gait & Station: unable to stand Patient leans: N/A  Psychiatric Specialty Exam: Physical Exam  PERRLA Moderate bilateral hand tremor which pt attributes to his myasthenia gravis and says is usually present +2 reflexes brachial, patellar achilles No signs of myoclonus  ROS  Blood pressure (!) 133/98, pulse (!) 134, temperature 98.1 F (36.7 C), temperature source Oral, resp. rate 11, height _0  (1.778 m), weight 82.6 kg (182 lb 1.6 oz),  SpO2 94 %.Body mass index is 26.13 kg/m.  General Appearance: Disheveled  Eye Contact:  Fair  Speech:  clear, mild slurring  Volume:  Normal  Mood:  Dysphoric  Affect:  Constricted  Thought Process:  Coherent  Orientation:  Other:  to place and person, mild errors in time  Thought Content:  Logical  Suicidal Thoughts:  denied in front of me   Homicidal Thoughts:  No  Memory:  impaired  Judgement:  Impaired  Insight:  Lacking  Psychomotor Activity:  Psychomotor Retardation  Concentration:  Concentration: Fair and Attention Span: Fair  Recall:  [employer] of Knowledge:  Fair  Language:  Fair  Akathisia:  No  Handed:    AIMS (if indicated):     Assets:  Housing  ADL's:  Could not assess  Cognition:  Grossly intact  Sleep:        Treatment Recommendations 56 yo Caucasian male with history of alcoholism and mood disorder unspecified.  Presenting after relapsing on alcohol.  Voiced SI in transport.  Hx notable for inpt rehab earlier this month.  On exam pt was tremulous but hard to know if that his myasthenia gravis vs withdrawal.  Tachycardiac, primary team managing heart arrythmia.  Normotensive.  Slightly confused on exam in terms of date but able to maintain attention.  Last drink yesterday so pt in the beginning stages of withdrawal at this time.   Patient is at elevated risk for self harm due to mental illness, substance abuse, white single male.  Protective factors are access to care, social support (parents), skills in problem solving and conflict resolution.  The majority of his risk factors are chronic, we are working on helping mitigate the substance abuse.  #alcohol use disorder, alcohol withdrawal  -Continue lorazepam CIWA.  Considering patient was fully sober two weeks ago his withdrawal likely will not be to severe -recommend getting records from [facility] he is reporting detoxing there  -work with family on discharge plan, rehab vs IOP  -gabapentin 648m TID which pt is  already on for neuro reasons may also help pt with sobriety, would continue if possible - ensure nonpharmacological management to minimize potential for delirium;  Lights off at night, lights on during the day; minimize stimuli (TV off).  Frequent ambulation as able. Minimize use of deliriogenic agents (ie.  anticholinergics, antihistamines, opioids). If. Use restraints as a last resort since  they are potentially deliriogenic; if use, then follow [facility].   #mood disorder -continue citalopram 247m(although pt could not verablly confirm this is his medication)  -d/c sitter pt is sober denies SI and plan now that he is sober. I will lift commitment. -pt stated if he were suicidal he would call 911, present for care -will need to talk to family about presence of guns in home before discharge   Thank you for consult.  Psychiatry will continue to follow.    LaJolene SchimkeMD 02/25/2017 4:35 PM

## 2017-02-25 NOTE — ED Notes (Signed)
Admitting Provider at bedside. 

## 2017-02-25 NOTE — H&P (Addendum)
[employer] - [place] [facility]   PATIENT NAME: Kurt Horton    MR#:  [id number]  DATE OF BIRTH:  1961/02/15  DATE OF ADMISSION:  02/24/2017  PRIMARY CARE PHYSICIAN: Raynelle Bring   REQUESTING/REFERRING PHYSICIAN: Dr. Dr. Governor Rooks  CHIEF COMPLAINT:   Chief Complaint  Patient presents with  . Alcohol Intoxication  . Psychiatric Evaluation    HISTORY OF PRESENT ILLNESS:  Dennis Hegeman  is a 56 y.o. male with a known history of myasthenia gravis, alcohol abuse, history of alcohol withdrawal, diabetes, hypertension who presents to the hospital due to alcohol detox. Patient's last drink was over 24-48 hours ago. Patient presented to the ER was placed on CIWA protocol. Patient then developed acute SVT with heart rates in the 180s. Patient was given multiple doses of adenosine but his heart rate did not slow down. He was noted to be atrial fibrillation with rapid ventricular response with heart rates in the 130s to 140s. He denies any chest pain but complains of some palpitations but no shortness of breath and no other associated symptoms. Given his atrial fibrillation with rapid ventricular response and persistent tachycardia hospitalist services were contacted further treatment and evaluation. Patient also as per the ER physician was having suicidal ideations and therefore is currently under involuntary commitment.  PAST MEDICAL HISTORY:   Past Medical History:  Diagnosis Date  . Alcohol abuse   . Alcohol withdrawal (HCC) 11/12/2016  . Diabetes mellitus without complication (HCC)   . DJD (degenerative joint disease)   . Hypertension   . Myasthenia gravis (HCC)   . Renal disorder     PAST SURGICAL HISTORY:   Past Surgical History:  Procedure Laterality Date  . AMPUTATION TOE Left 12/29/2015   Procedure: AMPUTATION TOE;  Surgeon: Gwyneth Revels, DPM;  Location: [facility] ORS;  Service: Podiatry;  Laterality: Left;  . PERIPHERAL VASCULAR CATHETERIZATION N/A  12/27/2015   Procedure: Lower Extremity Angiography;  Surgeon: Annice Needy, MD;  Location: [facility] INVASIVE CV LAB;  Service: Cardiovascular;  Laterality: N/A;    SOCIAL HISTORY:   Social History  Substance Use Topics  . Smoking status: Never Smoker  . Smokeless tobacco: Never Used  . Alcohol use Yes     Comment: Pt states that last drink was today    FAMILY HISTORY:   Family History  Problem Relation Age of Onset  . Rheum arthritis Father     DRUG ALLERGIES:   Allergies  Allergen Reactions  . Other Other (See Comments)    Patient states he can't take any "mycin" medications because myasthenia gravis.    REVIEW OF SYSTEMS:   Review of Systems  Constitutional: Negative for fever and weight loss.  HENT: Negative for congestion, nosebleeds and tinnitus.   Eyes: Negative for blurred vision, double vision and redness.  Respiratory: Negative for cough, hemoptysis and shortness of breath.   Cardiovascular: Positive for palpitations. Negative for chest pain, orthopnea, leg swelling and PND.  Gastrointestinal: Negative for abdominal pain, diarrhea, melena, nausea and vomiting.  Genitourinary: Negative for dysuria, hematuria and urgency.  Musculoskeletal: Negative for falls and joint pain.  Neurological: Negative for dizziness, tingling, sensory change, focal weakness, seizures, weakness and headaches.  Endo/Heme/Allergies: Negative for polydipsia. Does not bruise/bleed easily.  Psychiatric/Behavioral: Negative for depression and memory loss. The patient is not nervous/anxious.     MEDICATIONS AT HOME:   Prior to Admission medications   Medication Sig Start Date End Date Taking? Authorizing Provider  citalopram (CELEXA) 20 MG  tablet Take 1 tablet (20 mg total) by mouth daily. 08/24/16  Yes Sater, Pearletha Furl, MD  furosemide (LASIX) 40 MG tablet Take 40 mg by mouth daily as needed for fluid (if taking prednisone).    Yes [provider]  gabapentin (NEURONTIN) 300 MG capsule  Take 2 capsules (600 mg total) by mouth 3 (three) times daily. 08/24/16  Yes Sater, Pearletha Furl, MD  lisinopril (PRINIVIL,ZESTRIL) 10 MG tablet Take 1 tablet (10 mg total) by mouth daily. 12/30/15  Yes Wieting, Richard, MD  metFORMIN (GLUCOPHAGE) 500 MG tablet Take 1 tablet (500 mg total) by mouth daily. 12/30/15  Yes Wieting, Richard, MD  mycophenolate (CELLCEPT) 500 MG tablet Take one pill in the morning and two pills at night. 11/17/16  Yes Sater, Pearletha Furl, MD  ALPRAZolam Prudy Feeler) 0.5 MG tablet Take 1 or 2 tablets 30 min. prior to MRI.  You must not drink alcohol, drive, or operate dangerous equipment after taking this medication. Patient not taking: Reported on 02/25/2017 08/30/16   Sater, Pearletha Furl, MD  clonazePAM (KLONOPIN) 0.5 MG tablet Take 1 tablet (0.5 mg total) by mouth 2 (two) times daily as needed for anxiety. Patient not taking: Reported on 02/25/2017 12/30/15   Alford Highland, MD  diltiazem (CARDIZEM CD) 120 MG 24 hr capsule Take 1 capsule (120 mg total) by mouth daily. Patient not taking: Reported on 02/25/2017 11/14/16 11/14/17  Delfino Lovett, MD  fentaNYL (DURAGESIC - DOSED MCG/HR) 50 MCG/HR Place 1 patch (50 mcg total) onto the skin every 3 (three) days. Patient not taking: Reported on 08/24/2016 01/04/16   Asa Lente, MD  lidocaine-prilocaine (EMLA) cream Apply 1 application topically as needed. Apply to feet twice daily.    [provider]  metoprolol tartrate (LOPRESSOR) 25 MG tablet Take 1 tablet (25 mg total) by mouth 2 (two) times daily. Patient not taking: Reported on 02/25/2017 11/14/16   Delfino Lovett, MD  oxyCODONE (OXY IR/ROXICODONE) 5 MG immediate release tablet Take 1 tablet (5 mg total) by mouth every 8 (eight) hours as needed. 10/11/16   Sater, Pearletha Furl, MD  predniSONE (DELTASONE) 10 MG tablet Take 6 po x 2d, then 5 po x 2d, then 4 po x 2d, then 3 po x 2d, then 2 po x 2d, then 1 po x 2d 11/17/16   Sater, Pearletha Furl, MD  SUMAtriptan (IMITREX) 100 MG tablet Take 1 tablet  (100 mg total) by mouth every 2 (two) hours as needed for migraine. May repeat in 2 hours if headache persists or recurs. Patient not taking: Reported on 08/24/2016 12/30/15   Alford Highland, MD  zolpidem (AMBIEN) 10 MG tablet Take 1 tablet (10 mg total) by mouth at bedtime as needed for sleep. Patient not taking: Reported on 02/25/2017 12/30/15   Alford Highland, MD      VITAL SIGNS:  Blood pressure (!) 139/99, pulse (!) 177, resp. rate 17, height 6\' 2"  (1.88 m), weight 84.4 kg (186 lb), SpO2 96 %.  PHYSICAL EXAMINATION:  Physical Exam  GENERAL:  56 y.o.-year-old patient lying in the bed in no acute distress.  EYES: Pupils equal, round, reactive to light and accommodation. No scleral icterus. Extraocular muscles intact.  HEENT: Head atraumatic, normocephalic. Oropharynx and nasopharynx clear. No oropharyngeal erythema, moist oral mucosa  NECK:  Supple, no jugular venous distention. No thyroid enlargement, no tenderness.  LUNGS: Normal breath sounds bilaterally, no wheezing, rales, rhonchi. No use of accessory muscles of respiration.  CARDIOVASCULAR: S1, S2 Irregular. No murmurs, rubs, gallops,  clicks.  ABDOMEN: Soft, nontender, nondistended. Bowel sounds present. No organomegaly or mass.  EXTREMITIES: No pedal edema, cyanosis, or clubbing. + 2 pedal & radial pulses b/l.   NEUROLOGIC: Cranial nerves II through XII are intact. No focal Motor or sensory deficits appreciated b/l.  PSYCHIATRIC: The patient is alert and oriented x 3.  SKIN: No obvious rash, lesion, or ulcer.   LABORATORY PANEL:   CBC  Recent Labs Lab 02/24/17 2244  WBC 5.3  HGB 10.3*  HCT 32.5*  PLT 286   ------------------------------------------------------------------------------------------------------------------  Chemistries   Recent Labs Lab 02/24/17 2244  NA 139  K 3.1*  CL 99*  CO2 23  GLUCOSE 137*  BUN 10  CREATININE 0.78  CALCIUM 8.5*  AST 49*  ALT 28  ALKPHOS 77  BILITOT 0.6    ------------------------------------------------------------------------------------------------------------------  Cardiac Enzymes No results for input(s): TROPONINI in the last 168 hours. ------------------------------------------------------------------------------------------------------------------  RADIOLOGY:  No results found.   IMPRESSION AND PLAN:   56 year old male with past medical history of myasthenia gravis, hypertension, diabetes, alcohol abuse, history of alcohol withdrawal who presents to the hospital due to alcohol detox but also noted to be in acute SVT.  1. Atrial fibrillation with rapid ventricular response-I suspect this is secondary to patient's alcohol withdrawal. -Patient did not convert with adenosine. Patient placed on IV Cardizem., Also IV schedule metoprolol. -Follow heart rate. We'll get a cardiology consult, check two-dimensional echocardiogram. We'll place the patient on IV heparin drip.  2. Alcohol abuse-patient is at high risk for alcohol withdrawal. Continue CIWA protocol. Patient might need to be a Precedex drip.  3. Hypokalemia-we'll place the patient on oral potassium supplements and repeat level in the morning. Check magnesium level.  4. Suicidal ideations-patient had some suicidal ideations on admission. Patient is an involuntary commitment. -We'll get a psychiatric consult.  5. History of myasthenia gravis-patient is on chronic prednisone and CellCept which I will hold for now given the patient's acute illness.  6. Essential hypertension-continue lisinopril.  7. Neuropathy-continue gabapentin.  8. Diabetes type 2 without complication-hold metformin, we'll place the patient sliding scale insulin.  All the records are reviewed and case discussed with ED provider. Management plans discussed with the patient, family and they are in agreement.  CODE STATUS: Full code  TOTAL critical Care TIME TAKING CARE OF THIS PATIENT: 45 minutes.     Houston SirenSAINANI,VIVEK J M.D on 02/25/2017 at 1:21 PM  Between 7am to 6pm - Pager - [phone]  After 6pm go to [email or web address] - password [email or web address] [facility]  [facility]  Office  [phone]  CC: Primary care physician; [facility]

## 2017-02-25 NOTE — Progress Notes (Signed)
Spoke with Dr Herbie BaltimoreHarding regarding patients troponin level of 0.03. No additional orders given at this time.

## 2017-02-25 NOTE — ED Notes (Signed)
ED Provider at bedside. 

## 2017-02-25 NOTE — Consult Note (Signed)
PULMONARY / CRITICAL CARE MEDICINE   Name: Kurt Horton MRN: [id number] DOB: [birth year]    ADMISSION DATE:  02/24/2017   CONSULTATION DATE: 02/25/2017  REFERRING MD: Dr. Cherlynn Kaiser  Reason: ICU management of EtOH intoxication and possible EtOH withdrawal  CHIEF COMPLAINT: EtOH intoxication  HISTORY OF PRESENT ILLNESS:   This is a 56 year old Caucasian male with a past medical history as indicated below who presented to the ED with complaints of EtOH intoxication, low back pain and suicidal ideation.  He was placed on involuntary commitment by the ED but this has that has been discontinued by psychiatry after evaluation.  Patient states that he was in rehab for alcohol abuse when he relapsed.  He states that his last drink was 72 hours ago but it is unclear if this is accurate since he presented to the ED with actual intoxication.  Alcohol level in the ED was 475 and he was found to be in A. fib with RVR with heart rates in the 150s.  He also indicated in the ED that he wanted to kill himself hence he was placed on an involuntary commitment.  He currently denies any suicidal ideation.  He is being admitted to the ICU for further management of EtOH withdrawal stop PAST MEDICAL HISTORY :  He  has a past medical history of Alcohol abuse; Alcohol withdrawal (HCC) (11/12/2016); Diabetes mellitus without complication (HCC); DJD (degenerative joint disease); Hypertension; Myasthenia gravis (HCC); and Renal disorder.  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  Allergies  Allergen Reactions  . Other Other (See Comments)    Patient states he can't take any "mycin" medications because myasthenia gravis.    No current facility-administered medications on file prior to encounter.    Current Outpatient Prescriptions on File Prior to Encounter  Medication Sig  . citalopram (CELEXA) 20 MG tablet  Take 1 tablet (20 mg total) by mouth daily.  . furosemide (LASIX) 40 MG tablet Take 40 mg by mouth daily as needed for fluid (if taking prednisone).   . gabapentin (NEURONTIN) 300 MG capsule Take 2 capsules (600 mg total) by mouth 3 (three) times daily.  Marland Kitchen lisinopril (PRINIVIL,ZESTRIL) 10 MG tablet Take 1 tablet (10 mg total) by mouth daily.  . metFORMIN (GLUCOPHAGE) 500 MG tablet Take 1 tablet (500 mg total) by mouth daily.  . mycophenolate (CELLCEPT) 500 MG tablet Take one pill in the morning and two pills at night.  . ALPRAZolam (XANAX) 0.5 MG tablet Take 1 or 2 tablets 30 min. prior to MRI.  You must not drink alcohol, drive, or operate dangerous equipment after taking this medication. (Patient not taking: Reported on 02/25/2017)  . clonazePAM (KLONOPIN) 0.5 MG tablet Take 1 tablet (0.5 mg total) by mouth 2 (two) times daily as needed for anxiety. (Patient not taking: Reported on 02/25/2017)  . diltiazem (CARDIZEM CD) 120 MG 24 hr capsule Take 1 capsule (120 mg total) by mouth daily. (Patient not taking: Reported on 02/25/2017)  . fentaNYL (DURAGESIC - DOSED MCG/HR) 50 MCG/HR Place 1 patch (50 mcg total) onto the skin every 3 (three) days. (Patient not taking: Reported on 08/24/2016)  . lidocaine-prilocaine (EMLA) cream Apply 1 application topically as needed. Apply to feet twice daily.  . metoprolol tartrate (LOPRESSOR) 25 MG tablet Take 1 tablet (25 mg total) by mouth 2 (two) times daily. (Patient not taking: Reported on 02/25/2017)  . oxyCODONE (OXY IR/ROXICODONE) 5 MG  immediate release tablet Take 1 tablet (5 mg total) by mouth every 8 (eight) hours as needed.  . predniSONE (DELTASONE) 10 MG tablet Take 6 po x 2d, then 5 po x 2d, then 4 po x 2d, then 3 po x 2d, then 2 po x 2d, then 1 po x 2d  . SUMAtriptan (IMITREX) 100 MG tablet Take 1 tablet (100 mg total) by mouth every 2 (two) hours as needed for migraine. May repeat in 2 hours if headache persists or recurs. (Patient not taking: Reported on  08/24/2016)  . zolpidem (AMBIEN) 10 MG tablet Take 1 tablet (10 mg total) by mouth at bedtime as needed for sleep. (Patient not taking: Reported on 02/25/2017)    FAMILY HISTORY:  His indicated that his mother is alive. He indicated that his father is alive.    SOCIAL HISTORY: He  reports that he has never smoked. He has never used smokeless tobacco. He reports that he drinks alcohol.  REVIEW OF SYSTEMS:   Constitutional: Negative for fever and chills.  HENT: Negative for congestion and rhinorrhea.  Eyes: Negative for redness and visual disturbance.  Respiratory: Negative for shortness of breath and wheezing.  Cardiovascular: Negative for chest pain and palpitations.  Gastrointestinal: Negative  for nausea , vomiting and abdominal pain and  Loose stools Genitourinary: Negative for dysuria and urgency.  Endocrine: Denies polyuria, polyphagia and heat intolerance Musculoskeletal: Reports low back pain Skin: Negative for pallor and wound.  Neurological: Positive for occasional dizziness and headaches   SUBJECTIVE:   VITAL SIGNS: BP 133/87   Pulse (!) 118   Temp 98.1 F (36.7 C) (Oral)   Resp (!) 21   Ht 5\' 10"  (1.778 m)   Wt 182 lb 1.6 oz (82.6 kg)   SpO2 98%   BMI 26.13 kg/m   HEMODYNAMICS:    VENTILATOR SETTINGS:    INTAKE / OUTPUT: I/O last 3 completed shifts: In: -  Out: 520 [Urine:520]  PHYSICAL EXAMINATION: General: No acute distress Neuro: Alert and oriented x2, fine tremors in bilateral upper extremities normal, slowly delayed verbal responses, moves all extremities HEENT: PERRLA, trachea midline, no JVD Cardiovascular: Apical tachycardic, irregular irregular, S1-S2, normal medical showed a gallop, +2 pulses, no edema Lungs: Clear to auscultation bilaterally Abdomen: Normal bowel sounds, palpation reveals Musculoskeletal: Positive range of motion, no deformities Skin: Warm and dry, no cyanosis  LABS:  BMET  Recent Labs Lab 02/24/17 2244  NA 139   K 3.1*  CL 99*  CO2 23  BUN 10  CREATININE 0.78  GLUCOSE 137*    Electrolytes  Recent Labs Lab 02/24/17 2244  CALCIUM 8.5*  MG 2.0    CBC  Recent Labs Lab 02/24/17 2244 02/25/17 1444  WBC 5.3 5.6  HGB 10.3* 10.1*  HCT 32.5* 33.1*  PLT 286 237    Coag's  Recent Labs Lab 02/25/17 1444  APTT 26  INR 0.87    Sepsis Markers No results for input(s): LATICACIDVEN, PROCALCITON, O2SATVEN in the last 168 hours.  ABG No results for input(s): PHART, PCO2ART, PO2ART in the last 168 hours.  Liver Enzymes  Recent Labs Lab 02/24/17 2244  AST 49*  ALT 28  ALKPHOS 77  BILITOT 0.6  ALBUMIN 4.0    Cardiac Enzymes  Recent Labs Lab 02/25/17 1444 02/25/17 1837  TROPONINI 0.03* 0.03*    Glucose  Recent Labs Lab 02/25/17 1426 02/25/17 1626  GLUCAP 143* 155*    Imaging No results found.   STUDIES:  2D echo pending  CULTURES: None  ANTIBIOTICS: None  SIGNIFICANT EVENTS: 02/24/2017: Admitted  LINES/TUBES: Peripheral IV  DISCUSSION: 56 year old Caucasian male admitted with alcohol intoxication, high risk for alcohol withdrawal, hypomagnesemia and hypokalemia  ASSESSMENT A. fib with RVR Acute alcohol intoxication High risk for alcohol withdrawal  Alcohol abuse Electrolyte imbalances History of myasthenia gravis, type 2 diabetes and hypertension  PLAN Hemodynamic monitoring per ICU protocol CIWA protocol Diltiazem infusion and titrate to keep heart rate less than 100 Thiamine, folic acid, and vitamin B12 supplementation Heparin per A. fib protocol Precedex infusion for withdrawal  symptoms IV fluids Follow-up 2D echo Cycle troponin Oxycodone as needed for pain Zofran for nausea As needed lorazepam for nausea and vomiting Psychiatric consult Monitor and correct electrolytes Continue all home medications FAMILY  - Updates: Patient updated at bedside.  Patient is a full code  - Inter-disciplinary family meet or Palliative Care  meeting due by:  day 7   Harlen Danford S. [facility] ANP-BC Pulmonary and Critical Care Medicine [facility] Pager [phone] or [phone] 02/25/2017, 8:14 PM

## 2017-02-25 NOTE — Progress Notes (Signed)
   I was called, the consult was then properly placed upon the patient's arrival to the ICU.  I came to see the patient and within 5 minutes of starting chart review, I realized the patient had been seen by Dr. Welton FlakesKhan in July.  I notified the patient's nurse They will contact Dr. Welton FlakesKhan.   Bryan Lemmaavid Harding, MD

## 2017-02-25 NOTE — BH Assessment (Signed)
Unable to assess the patient at this time due to the patient being intoxicated (BAL 475).  Will assess the patient when he is sober.

## 2017-02-25 NOTE — Progress Notes (Signed)
Dr Bard HerbertSimmonds at bedside assessing patient, heart rate in the 150's. At this time I am sitting with patient as sitter had to go to lunch and charge nurse went to an emergency. Will start heparin drip and give patient metoprolol once I am relieved for sitting.

## 2017-02-25 NOTE — ED Notes (Signed)
Pt. Asked "how are you feeling?"  Pt. Responds "I am feeling fine, I am thirsty"  Pt. Given two cups of water. Pt. Was thankful.

## 2017-02-25 NOTE — ED Provider Notes (Addendum)
[facility]  I accepted care from Dr. York Cerise ____________________________________________    LABS (pertinent positives/negatives)  I, Governor Rooks, MD have personally reviewed the lab reports noted below.  Labs Reviewed  COMPREHENSIVE METABOLIC PANEL - Abnormal; Notable for the following:       Result Value   Potassium 3.1 (*)    Chloride 99 (*)    Glucose, Bld 137 (*)    Calcium 8.5 (*)    AST 49 (*)    Anion gap 17 (*)    All other components within normal limits  ETHANOL - Abnormal; Notable for the following:    Alcohol, Ethyl (B) 475 (*)    All other components within normal limits  CBC - Abnormal; Notable for the following:    RBC 4.39 (*)    Hemoglobin 10.3 (*)    HCT 32.5 (*)    MCV 74.0 (*)    MCH 23.5 (*)    MCHC 31.7 (*)    RDW 20.7 (*)    All other components within normal limits  URINE DRUG SCREEN, QUALITATIVE ([facility] ONLY) - Abnormal; Notable for the following:    Benzodiazepine, Ur Scrn POSITIVE (*)    All other components within normal limits  ACETAMINOPHEN LEVEL - Abnormal; Notable for the following:    Acetaminophen (Tylenol), Serum <10 (*)    All other components within normal limits  URINALYSIS, COMPLETE (UACMP) WITH MICROSCOPIC - Abnormal; Notable for the following:    Color, Urine YELLOW (*)    APPearance CLEAR (*)    Ketones, ur 5 (*)    Protein, ur 30 (*)    All other components within normal limits  SALICYLATE LEVEL       ____________________________________________   PROCEDURES  Procedure(s) performed: None  Critical Care performed: CRITICAL CARE Performed by: Governor Rooks   Total critical care time: 30 minutes  Critical care time was exclusive of separately billable procedures and treating other patients.  Critical care was necessary to treat or prevent imminent or life-threatening deterioration.  Critical care was time spent personally by me on the following activities: development of treatment plan with  patient and/or surrogate as well as nursing, discussions with consultants, evaluation of patient's response to treatment, examination of patient, obtaining history from patient or surrogate, ordering and performing treatments and interventions, ordering and review of laboratory studies, ordering and review of radiographic studies, pulse oximetry and re-evaluation of patient's condition.  ___________________________________________  ECG: 178 bpm.  Narrow complex tachycardia undetermined rhythm.  Narrow stress.  Normal axis.  Nonspecific ST and T wave  ECG: 152 bpm.  Irregularly irregular consistent with atrial for ablation with rapid ventricular response.  Narrow QRS, undetermined axis.  Nonspecific ST and T wave. ____________________________________________   INITIAL IMPRESSION / ASSESSMENT AND PLAN / ED COURSE   Pertinent labs & imaging results that were available during my care of the patient were reviewed by me and considered in my medical decision making (see chart for details).    Called to review patient's ECG -narrow complex tachycardia around 170s.  Suspicious of SVT with the patient giving a history of prior SVT.  Adenosine was prepared and patient was given 6 mg adenosine, no response.  With second try 6 mg adenosine, could see underlying irregularly irregular consistent with atrial fibrillation.  Given now suspicion atrial fibrillation is underlying rhythm with rapid ventricular response.  Patient was given 10 mg diltiazem with heart rate drifting down into the 140s from the 170s.  However did  go back up.  Patient was given second bolus of 10 mg diltiazem which went down at times into the 120s and then went back up to the 170s.  Patient was additionally treated with IV fluids as well as 2 mg of Ativan for what I suspect is the underlying initiating cause for the A. fib which is his alcohol withdrawal.  Patient be placed on diltiazem drip after third bolus 10 mg diltiazem as his  heart rate back up to 170s.  Patient is under involuntary movement for some suicidal ideation and associated with his substance use disorder.  For me he is awake alert and interactive and not having active suicidal ideation or active psychosis.   DIFFERENTIAL DIAGNOSIS: including but not limited to SVT, atrial flutter, atrial fibrillation with rapid ventricular response, dehydration, alcohol withdrawal.  CONSULTATIONS: Hospitalist for admission, Dr. Cherlynn KaiserSainani.  I spoke with Dr. Herbie BaltimoreHarding, unassigned cardiology    Patient / Family / Caregiver informed of clinical course, medical decision-making process, and agree with plan.  Addended to include that Dr. Herbie BaltimoreHarding with cardiology recommends IV Lopressor 5 mg x1 or 2 doses to help with adrenergic tone due to the alcohol withdrawal concerning factor.  ____________________________________________   FINAL CLINICAL IMPRESSION(S) / ED DIAGNOSES  Final diagnoses:  Alcohol abuse  Acute alcoholic intoxication with complication (HCC)  Depression, unspecified depression type  Atrial fibrillation with rapid ventricular response (HCC)        Governor RooksLord, Mclean Moya, MD 02/25/17 1246    Governor RooksLord, Analeese Andreatta, MD 02/25/17 1256

## 2017-02-25 NOTE — ED Notes (Signed)
SOC completed 

## 2017-02-25 NOTE — ED Notes (Signed)
SOC in room. Pending completion.

## 2017-02-25 NOTE — Progress Notes (Signed)
MICU NP called around 8pm re: pt threatening to leave, but she had not seen and evaluated him yet.  Asked if pt had capacity, I recommended she ask  -if pt is suicidal -if pt is aware of what he is being treated for -if pt is aware of risks of refusing tx  Although I lifted pt's commitment since he denied SI to me, as well as nurse and behav health in ED, it is good to assess for capacity if pt wants to leave AMA.  Capacity can change over time.  Psychiatry is happy to continue to follow and assist.

## 2017-02-25 NOTE — BH Assessment (Signed)
Assessment Note  Kurt Horton is an 56 y.o. male who presents to the ER due to complaints of back pain and drinking large amounts of alcohol. Patient was unable to share the amount of alcohol he drinks but reports "it's everyday."  Upon arrival to the ER his BAC was 475. While in the ER he stated he was having thoughts of harming himself. With this Clinical research associate, he denied SI/HI and AV/H.    During the interview, the patient was calm, cooperative and pleasant. He was able to answer questions with appropriate answers. He was recently inpatient with [facility] to address his alcohol use. He was there for fourteen days. Upon discharge, they set him up with [facility] for their SAIOP.  He scheduled to start their program within a few days.  Diagnosis: Alcohol Use Disorder & Depression  Past Medical History:  Past Medical History:  Diagnosis Date  . Alcohol abuse   . Alcohol withdrawal (HCC) 11/12/2016  . Diabetes mellitus without complication (HCC)   . DJD (degenerative joint disease)   . Hypertension   . Myasthenia gravis (HCC)   . Renal disorder     Past Surgical History:  Procedure Laterality Date  . AMPUTATION TOE Left 12/29/2015   Procedure: AMPUTATION TOE;  Surgeon: Gwyneth Revels, DPM;  Location: [facility] ORS;  Service: Podiatry;  Laterality: Left;  . PERIPHERAL VASCULAR CATHETERIZATION N/A 12/27/2015   Procedure: Lower Extremity Angiography;  Surgeon: Annice Needy, MD;  Location: [facility] INVASIVE CV LAB;  Service: Cardiovascular;  Laterality: N/A;    Family History:  Family History  Problem Relation Age of Onset  . Rheum arthritis Father     Social History:  reports that he has never smoked. He has never used smokeless tobacco. He reports that he drinks alcohol. His drug history is not on file.  Additional Social History:  Alcohol / Drug Use Pain Medications: See PTA Prescriptions: See PTA Over the Counter: See PTA History of alcohol / drug use?: Yes Longest period of  sobriety (when/how long): Unable to quantify  Negative Consequences of Use: Personal relationships, Work / School Withdrawal Symptoms: Fever / Chills, Weakness, Tremors, Nausea / Vomiting Substance #1 Name of Substance 1: Alcohol 1 - Age of First Use: Unknown 1 - Amount (size/oz): Unable to quantify  1 - Frequency: Daily 1 - Duration: This amount & frequency for approximately 1 - Last Use / Amount: 02/24/2017  CIWA: CIWA-Ar BP: (!) 152/82 Pulse Rate: (!) 105 Nausea and Vomiting: mild nausea with no vomiting Tactile Disturbances: very mild itching, pins and needles, burning or numbness Tremor: two Auditory Disturbances: not present Paroxysmal Sweats: barely perceptible sweating, palms moist Visual Disturbances: not present Anxiety: three Headache, Fullness in Head: none present Agitation: somewhat more than normal activity Orientation and Clouding of Sensorium: oriented and can do serial additions CIWA-Ar Total: 9 COWS:    Allergies:  Allergies  Allergen Reactions  . Other Other (See Comments)    Patient states he can't take any "mycin" medications because myasthenia gravis.    Home Medications:  (Not in a hospital admission)  OB/GYN Status:  No LMP for male patient.  General Assessment Data Assessment unable to be completed: Yes Location of Assessment: [facility] ED TTS Assessment: In system Is this a Tele or Face-to-Face Assessment?: Face-to-Face Is this an Initial Assessment or a Re-assessment for this encounter?: Initial Assessment Marital status: Single Maiden name: n/a Is patient pregnant?: No Pregnancy Status: No Living Arrangements: Parent Can pt return to current living  arrangement?: Yes Admission Status: Involuntary Is patient capable of signing voluntary admission?: Yes Referral Source: Self/Family/Friend Insurance type: Medicaid  Medical Screening Exam [facility] Walk-in ONLY) Medical Exam completed: Yes  Crisis Care Plan Living Arrangements: Parent Legal  Guardian: Other: (Self) Name of Psychiatrist: Reports of none Name of Therapist: [employer]  Education Status Is patient currently in school?: No Current Grade: n/a Highest grade of school patient has completed: Master Degree Name of school: [employer] Contact person: n/a  Risk to self with the past 6 months Suicidal Ideation: No Has patient been a risk to self within the past 6 months prior to admission? : No Suicidal Intent: No Has patient had any suicidal intent within the past 6 months prior to admission? : No Is patient at risk for suicide?: No Suicidal Plan?: No Has patient had any suicidal plan within the past 6 months prior to admission? : No Access to Means: No What has been your use of drugs/alcohol within the last 12 months?: Alcohol Previous Attempts/Gestures: No How many times?: 0 Other Self Harm Risks: Active Addiction Triggers for Past Attempts: None known Intentional Self Injurious Behavior: None Family Suicide History: No Recent stressful life event(s): Other (Comment), Recent negative physical changes (Active Addiction) Persecutory voices/beliefs?: No Depression: Yes Depression Symptoms: Tearfulness, Isolating, Fatigue, Guilt, Loss of interest in usual pleasures, Feeling worthless/self pity, Insomnia Substance abuse history and/or treatment for substance abuse?: Yes Suicide prevention information given to non-admitted patients: Not applicable  Risk to Others within the past 6 months Homicidal Ideation: No Does patient have any lifetime risk of violence toward others beyond the six months prior to admission? : No Thoughts of Harm to Others: No Current Homicidal Intent: No Current Homicidal Plan: No Access to Homicidal Means: No Identified Victim: Reports of none History of harm to others?: No Assessment of Violence: None Noted Violent Behavior Description: Reports of none Does patient have access to weapons?: No Criminal Charges Pending?:  No Does patient have a court date: No Is patient on probation?: No  Psychosis Hallucinations: None noted Delusions: None noted  Mental Status Report Appearance/Hygiene: Unremarkable, In scrubs Eye Contact: Good Motor Activity: Freedom of movement, Unremarkable Speech: Logical/coherent, Unremarkable Level of Consciousness: Alert Mood: Depressed, Sad, Pleasant Affect: Appropriate to circumstance, Depressed, Sad Anxiety Level: Minimal Thought Processes: Coherent, Relevant Judgement: Partial Orientation: Person, Place, Time, Situation, Appropriate for developmental age Obsessive Compulsive Thoughts/Behaviors: Minimal  Cognitive Functioning Concentration: Normal Memory: Recent Intact, Remote Intact IQ: Average Insight: Fair Impulse Control: Fair Appetite: Poor Weight Loss: 30 (Within the last 12 months) Weight Gain: 0 Sleep: Decreased (Trouble falling & staying asleep) Total Hours of Sleep: 4  ADLScreening [facility] Assessment Services) Patient's cognitive ability adequate to safely complete daily activities?: Yes Patient able to express need for assistance with ADLs?: Yes Independently performs ADLs?: Yes (appropriate for developmental age)  Prior Inpatient Therapy Prior Inpatient Therapy: Yes Prior Therapy Dates: 02/2017 Prior Therapy Facilty/Provider(s): [facility]  Reason for Treatment: Alcohol Use Use  Prior Outpatient Therapy Prior Outpatient Therapy: Yes Prior Therapy Dates: Current Prior Therapy Facilty/Provider(s): [facility] Reason for Treatment: SAIOP Does patient have an ACCT team?: No Does patient have Intensive In-House Services?  : No Does patient have [facility] services? : No Does patient have P4CC services?: No  ADL Screening (condition at time of admission) Patient's cognitive ability adequate to safely complete daily activities?: Yes Is the patient deaf or have difficulty hearing?: No Does the patient have difficulty seeing, even when  wearing glasses/contacts?: No  Does the patient have difficulty concentrating, remembering, or making decisions?: No Patient able to express need for assistance with ADLs?: Yes Does the patient have difficulty dressing or bathing?: No Independently performs ADLs?: Yes (appropriate for developmental age) Does the patient have difficulty walking or climbing stairs?: No Weakness of Legs: None Weakness of Arms/Hands: None  Home Assistive Devices/Equipment Home Assistive Devices/Equipment: None  Therapy Consults (therapy consults require a physician order) PT Evaluation Needed: No OT Evalulation Needed: No SLP Evaluation Needed: No Abuse/Neglect Assessment (Assessment to be complete while patient is alone) Physical Abuse: Denies Verbal Abuse: Denies Sexual Abuse: Denies Exploitation of patient/patient's resources: Denies Self-Neglect: Denies Values / Beliefs Cultural Requests During Hospitalization: None Spiritual Requests During Hospitalization: None Consults Spiritual Care Consult Needed: No Social Work Consult Needed: No      Additional Information 1:1 In Past 12 Months?: No CIRT Risk: No Elopement Risk: No Does patient have medical clearance?: No  Child/Adolescent Assessment Running Away Risk: Denies (Patient is an adult)  Disposition:  Disposition Initial Assessment Completed for this Encounter: Yes Disposition of Patient: Pending Review with psychiatrist  On Site Evaluation by:   Reviewed with Physician:    Lilyan Gilfordalvin J. Bertil Brickey MS, LCAS, LPC, NCC, CCSI Therapeutic Triage Specialist 02/25/2017 10:11 AM

## 2017-02-25 NOTE — Progress Notes (Signed)
Patient has sitter at bedside.  

## 2017-02-25 NOTE — Consult Note (Signed)
ANTICOAGULATION CONSULT NOTE - Initial Consult  Pharmacy Consult for heparin drip Indication: atrial fibrillation  Allergies  Allergen Reactions  . Other Other (See Comments)    Patient states he can't take any "mycin" medications because myasthenia gravis.    Patient Measurements: Height: 5\' 10"  (177.8 cm) Weight: 182 lb 1.6 oz (82.6 kg) IBW/kg (Calculated) : 73 Heparin Dosing Weight: 84.4kg  Vital Signs: Temp: 98.6 F (37 C) (10/21 2000) Temp Source: Oral (10/21 2000) BP: 135/76 (10/21 2100) Pulse Rate: 102 (10/21 2100)  Labs:  Recent Labs  02/24/17 2244 02/25/17 1444 02/25/17 1837 02/25/17 2115  HGB 10.3* 10.1*  --   --   HCT 32.5* 33.1*  --   --   PLT 286 237  --   --   APTT  --  26  --   --   LABPROT  --  11.8  --   --   INR  --  0.87  --   --   HEPARINUNFRC  --   --   --  0.65  CREATININE 0.78  --   --  0.77  TROPONINI  --  0.03* 0.03*  --     Estimated Creatinine Clearance: 106.5 mL/min (by C-G formula based on SCr of 0.77 mg/dL).   Medical History: Past Medical History:  Diagnosis Date  . Alcohol abuse   . Alcohol withdrawal (HCC) 11/12/2016  . Diabetes mellitus without complication (HCC)   . DJD (degenerative joint disease)   . Hypertension   . Myasthenia gravis (HCC)   . Renal disorder     Medications:  Scheduled:  . citalopram  20 mg Oral Daily  . folic acid  1 mg Oral Daily  . gabapentin  600 mg Oral TID  . insulin aspart  0-5 Units Subcutaneous QHS  . insulin aspart  0-9 Units Subcutaneous TID WC  . lisinopril  10 mg Oral Daily  . [START ON 02/27/2017] LORazepam  0-4 mg Intravenous Q12H   Or  . [START ON 02/27/2017] LORazepam  0-4 mg Oral Q12H  . LORazepam  0-4 mg Intravenous Q6H   Or  . LORazepam  0-4 mg Oral Q6H  . metoprolol tartrate  5 mg Intravenous Q6H  . [START ON 02/26/2017] pneumococcal 23 valent vaccine  0.5 mL Intramuscular Tomorrow-1000  . potassium chloride  20 mEq Oral BID  . thiamine  100 mg Oral Daily  . vitamin  B-12  1,000 mcg Oral Daily    Assessment: Patient is a 56 year old male w/ known alcohol abuse admitted to hospital for alcohol detox. Pt developed SVT w/ HR in the 180s. Pt then noted to be in afib and placed on a diltiazem drip. Pharmacy consulted to dose heparin drip  Goal of Therapy:  Heparin level 0.3-0.7 units/ml Monitor platelets by anticoagulation protocol: Yes   Plan:  Give 4250 units bolus x 1 Start heparin infusion at 1350 units/hr Check anti-Xa level in 6 hours and daily while on heparin Continue to monitor H&H and platelets  10/21 @ 2115: HL 0.65. Level is therapeutic. Will redraw confirmatory heparin level in 6 hours. CBC with AM labs per protocol.   Gardner CandleSheema M Sia Gabrielsen, PharmD, BCPS Clinical Pharmacist 02/25/2017 9:50 PM

## 2017-02-25 NOTE — ED Notes (Signed)
Pt sleeping. 

## 2017-02-25 NOTE — ED Notes (Signed)
Pt ambulated to bathroom with assistance x1

## 2017-02-25 NOTE — ED Notes (Signed)
Patient is resting at this time.

## 2017-02-25 NOTE — ED Notes (Signed)
Patient was assisted to the restroom. ?

## 2017-02-26 ENCOUNTER — Encounter: Payer: Self-pay | Admitting: *Deleted

## 2017-02-26 ENCOUNTER — Inpatient Hospital Stay
Admit: 2017-02-26 | Discharge: 2017-02-26 | Disposition: A | Discharge status: Home / Self Care | Payer: Medicaid Other | Attending: Specialist | Admitting: Specialist

## 2017-02-26 DIAGNOSIS — F10231 Alcohol dependence with withdrawal delirium: Secondary | ICD-10-CM

## 2017-02-26 DIAGNOSIS — I4891 Unspecified atrial fibrillation: Secondary | ICD-10-CM

## 2017-02-26 DIAGNOSIS — F10931 Alcohol use, unspecified with withdrawal delirium: Secondary | ICD-10-CM

## 2017-02-26 LAB — MAGNESIUM: Magnesium: 1.4 mg/dL — ABNORMAL LOW (ref 1.7–2.4)

## 2017-02-26 LAB — GLUCOSE, CAPILLARY
GLUCOSE-CAPILLARY: 124 mg/dL — AB (ref 65–99)
GLUCOSE-CAPILLARY: 170 mg/dL — AB (ref 65–99)
Glucose-Capillary: 156 mg/dL — ABNORMAL HIGH (ref 65–99)
Glucose-Capillary: 113 mg/dL — ABNORMAL HIGH (ref 65–99)
Glucose-Capillary: 125 mg/dL — ABNORMAL HIGH (ref 65–99)

## 2017-02-26 LAB — BASIC METABOLIC PANEL
ANION GAP: 12 (ref 5–15)
BUN: 12 mg/dL (ref 6–20)
CALCIUM: 8.9 mg/dL (ref 8.9–10.3)
CO2: 27 mmol/L (ref 22–32)
Chloride: 97 mmol/L — ABNORMAL LOW (ref 101–111)
Creatinine, Ser: 0.78 mg/dL (ref 0.61–1.24)
GLUCOSE: 152 mg/dL — AB (ref 65–99)
POTASSIUM: 3.3 mmol/L — AB (ref 3.5–5.1)
SODIUM: 136 mmol/L (ref 135–145)

## 2017-02-26 LAB — CBC
HEMATOCRIT: 29.9 % — AB (ref 40.0–52.0)
HEMOGLOBIN: 9.1 g/dL — AB (ref 13.0–18.0)
MCH: 22.6 pg — ABNORMAL LOW (ref 26.0–34.0)
MCHC: 30.4 g/dL — ABNORMAL LOW (ref 32.0–36.0)
MCV: 74.5 fL — ABNORMAL LOW (ref 80.0–100.0)
Platelets: 218 10*3/uL (ref 150–440)
RBC: 4.02 MIL/uL — AB (ref 4.40–5.90)
RDW: 21 % — ABNORMAL HIGH (ref 11.5–14.5)
WBC: 5.1 10*3/uL (ref 3.8–10.6)

## 2017-02-26 LAB — HEPARIN LEVEL (UNFRACTIONATED): HEPARIN UNFRACTIONATED: 0.53 [IU]/mL (ref 0.30–0.70)

## 2017-02-26 LAB — PHOSPHORUS: PHOSPHORUS: 3.6 mg/dL (ref 2.5–4.6)

## 2017-02-26 MED ORDER — POTASSIUM CHLORIDE CRYS ER 20 MEQ PO TBCR
40.0000 meq | EXTENDED_RELEASE_TABLET | Freq: Two times a day (BID) | ORAL | Status: AC
  Administered 2017-02-26 (×2): 40 meq via ORAL
  Filled 2017-02-26 (×2): qty 2

## 2017-02-26 MED ORDER — SODIUM CHLORIDE 0.9 % IV BOLUS (SEPSIS)
1000.0000 mL | Freq: Once | INTRAVENOUS | Status: AC
  Administered 2017-02-26: 1000 mL via INTRAVENOUS

## 2017-02-26 MED ORDER — MAGNESIUM SULFATE 4 GM/100ML IV SOLN
4.0000 g | Freq: Once | INTRAVENOUS | Status: AC
  Administered 2017-02-26: 4 g via INTRAVENOUS
  Filled 2017-02-26: qty 100

## 2017-02-26 MED ORDER — OXYCODONE HCL 5 MG PO TABS
2.5000 mg | ORAL_TABLET | Freq: Three times a day (TID) | ORAL | Status: DC
  Administered 2017-02-26 (×2): 2.5 mg via ORAL
  Filled 2017-02-26 (×2): qty 1

## 2017-02-26 MED ORDER — LORAZEPAM 2 MG/ML IJ SOLN
2.0000 mg | Freq: Once | INTRAMUSCULAR | Status: AC
  Administered 2017-02-26: 2 mg via INTRAVENOUS
  Filled 2017-02-26: qty 1

## 2017-02-26 NOTE — Progress Notes (Signed)
Patient awake all shift.  Patient back and forth between sitting up in bed and laying back in bed. Able to make needs known.  Confused at times to where he is.  Will monitor.

## 2017-02-26 NOTE — Progress Notes (Signed)
[employer] [facility] [place] [facility]   PATIENT NAME: Kurt SimmerJohn Horton    Horton#:  [id number]  DATE OF BIRTH:  10-15-1960  SUBJECTIVE:  CHIEF COMPLAINT:   Chief Complaint  Patient presents with  . Alcohol Intoxication  . Psychiatric Evaluation   -Came in with alcohol intoxication, requiring Precedex drip. -Easily arousable and oriented. Denies any suicidal ideation  REVIEW OF SYSTEMS:  Review of Systems  Constitutional: Positive for malaise/fatigue. Negative for chills and fever.  HENT: Negative for congestion, ear discharge, hearing loss and nosebleeds.   Eyes: Negative for blurred vision and double vision.  Respiratory: Negative for cough, shortness of breath and wheezing.   Cardiovascular: Negative for chest pain, palpitations and leg swelling.  Gastrointestinal: Negative for abdominal pain, constipation, diarrhea, nausea and vomiting.  Genitourinary: Negative for dysuria.  Musculoskeletal: Negative for myalgias.  Neurological: Positive for tremors. Negative for dizziness, speech change, focal weakness, seizures and headaches.  Psychiatric/Behavioral: Negative for depression. The patient is nervous/anxious.     DRUG ALLERGIES:   Allergies  Allergen Reactions  . Other Other (See Comments)    Patient states he can't take any "mycin" medications because myasthenia gravis.    VITALS:  Blood pressure (!) 70/49, pulse 72, temperature 97.9 F (36.6 C), temperature source Oral, resp. rate 16, height 5\' 10"  (1.778 m), weight 82.6 kg (182 lb 1.6 oz), SpO2 94 %.  PHYSICAL EXAMINATION:  Physical Exam  GENERAL:  56 y.o.-year-old patient lying in the bed with no acute distress. Sleeping but easily arousable EYES: Pupils equal, round, reactive to light and accommodation. No scleral icterus. Extraocular muscles intact.  HEENT: Head atraumatic, normocephalic. Oropharynx and nasopharynx clear.  NECK:  Supple, no jugular venous distention. No thyroid enlargement, no  tenderness.  LUNGS: Normal breath sounds bilaterally, no wheezing, rales,rhonchi or crepitation. No use of accessory muscles of respiration.  CARDIOVASCULAR: S1, S2 normal. No murmurs, rubs, or gallops.  ABDOMEN: Soft, nontender, nondistended. Bowel sounds present. No organomegaly or mass.  EXTREMITIES: No pedal edema, cyanosis, or clubbing.  NEUROLOGIC: Cranial nerves II through XII are intact. Muscle strength 5/5 in all extremities. Following commands.Sensation intact. Gait not checked.  PSYCHIATRIC: The patient is sleeping, but easily arousable and oriented. SKIN: No obvious rash, lesion, or ulcer.    LABORATORY PANEL:   CBC  Recent Labs Lab 02/26/17 0406  WBC 5.1  HGB 9.1*  HCT 29.9*  PLT 218   ------------------------------------------------------------------------------------------------------------------  Chemistries   Recent Labs Lab 02/25/17 2115 02/26/17 0406  NA 137 136  K 3.1* 3.3*  CL 96* 97*  CO2 26 27  GLUCOSE 141* 152*  BUN 12 12  CREATININE 0.77 0.78  CALCIUM 8.8* 8.9  MG 1.4* 1.4*  AST 42*  --   ALT 26  --   ALKPHOS 70  --   BILITOT 1.0  --    ------------------------------------------------------------------------------------------------------------------  Cardiac Enzymes  Recent Labs Lab 02/25/17 2115  TROPONINI <0.03   ------------------------------------------------------------------------------------------------------------------  RADIOLOGY:  No results found.  EKG:   Orders placed or performed during the hospital encounter of 02/24/17  . EKG 12-Lead  . EKG 12-Lead  . EKG 12-Lead  . EKG 12-Lead  . ED EKG  . ED EKG  . ED EKG  . ED EKG    ASSESSMENT AND PLAN:   56 year old male with past medical history significant for alcohol abuse, depression and anxiety, hypertension degenerative disc disease presents to hospital secondary to alcohol intoxication. Noted to be in A. Fib with RVR  #  1 atrial fibrillation with rapid  ventricular response-requiring Cardizem drip, in ICU -Also on heparin drip. -Once converted to normal sinus rhythm, change to oral Cardizem. -Agree with echocardiogram. Mild elevation in troponin is secondary to demand ischemia -Discontinue heparin drip if okay with cardiology  #2 alcohol intoxication-currently at a rehabilitation, but relapse in alcohol thinking again -Unknown reason provided at this time. -Continue CIWA protocol, on Precedex drip at this time for DTs -continue thiamine, folic acid and B12 supplementation.  #3 depression with suicidal ideation-was committed involuntarily in the emergency room. -Patient stays at this time that he is 'worried about things' but is not suicidal. -Psych consult is pending  #4 hypokalemia-being replaced  #5 chronic low back pain-continue pain medications as needed  #6-VT prophylaxis-currently on heparin drip   All the records are reviewed and case discussed with Care Management/Social Workerr. Management plans discussed with the patient, family and they are in agreement.  CODE STATUS: full code  TOTAL TIME TAKING CARE OF THIS PATIENT: 38 minutes.   POSSIBLE D/C IN 2-3 DAYS, DEPENDING ON CLINICAL CONDITION.   Enid Baas M.D on 02/26/2017 at 11:10 AM  Between 7am to 6pm - Pager - [phone]  After 6pm go to [email or web address] - password [email or web address]  [facility]  Office  [phone]  CC: Primary care physician; Lamar Blinks, MD

## 2017-02-26 NOTE — Consult Note (Signed)
[facility] Consult   Reason for Consult: Consult for a 56 year old man with alcohol abuse currently in the intensive care unit being managed for delirium tremens Referring Physician:  Tressia Miners Patient Identification: Kurt Horton MRN:  [id number] Principal Diagnosis: <principal problem not specified> Diagnosis:   Patient Active Problem List   Diagnosis Date Noted  . Atrial fibrillation with RVR ([place]) [I48.91] 02/25/2017  . Acute alcoholic intoxication with complication ([place]) [M57.846]   . Alcohol abuse [F10.10]   . Alcohol withdrawal ([place]) [F10.239] 11/13/2016  . Substance induced mood disorder ([place]) [F19.94] 11/13/2016  . SVT (supraventricular tachycardia) ([place]) [I47.1] 11/12/2016  . HTN (hypertension) [I10] 11/12/2016  . EtOH dependence ([place]) [F10.20] 11/12/2016  . Lumbar radiculopathy [M54.16] 08/24/2016  . Depression with anxiety [F41.8] 08/24/2016  . Chronic low back pain [M54.5, G89.29] 01/04/2016  . Pressure ulcer [L89.90] 12/29/2015  . Osteomyelitis ([place]) [M86.9] 12/26/2015  . PVD (peripheral vascular disease) ([place]) [I73.9] 12/26/2015  . Type II diabetes mellitus with manifestations ([place]) [E11.8] 12/26/2015  . Myasthenia gravis ([place]) [G70.00] 12/26/2015    Total Time spent with patient: 30 minutes  Subjective:   Kurt Horton is a 56 y.o. male patient admitted with patient not able to give any information.  This 56 year old man presented to the emergency room with a blood alcohol level over 400.  Has been having complicated alcohol withdrawal and is being managed in the intensive care unit.Marland Kitchen  HPI: Patient was seen over the weekend by psychiatrist on call.  He is in the intensive care being managed for agitation and delirium.  On evaluation today the patient is still on a Precedex drip.  Nursing reports that he was responsive when they were working with him earlier but he did not respond to verbal cues from me.  Currently blood pressure little high but vital  signs stable not actively tremulous.  Patient not able to answer any questions  Past Psychiatric History: Patient with a history of alcohol abuse.  Has been seen previously in the emergency room.  Unclear if he is ever had DTs in the past.  History of mild depression but no hospitalization no history of suicide attempts  Risk to Self: Suicidal Ideation: No Suicidal Intent: No Is patient at risk for suicide?: No Suicidal Plan?: No Access to Means: No What has been your use of drugs/alcohol within the last 12 months?: Alcohol How many times?: 0 Other Self Harm Risks: Active Addiction Triggers for Past Attempts: None known Intentional Self Injurious Behavior: None Risk to Others: Homicidal Ideation: No Thoughts of Harm to Others: No Current Homicidal Intent: No Current Homicidal Plan: No Access to Homicidal Means: No Identified Victim: Reports of none History of harm to others?: No Assessment of Violence: None Noted Violent Behavior Description: Reports of none Does patient have access to weapons?: No Criminal Charges Pending?: No Does patient have a court date: No Prior Inpatient Therapy: Prior Inpatient Therapy: Yes Prior Therapy Dates: 02/2017 Prior Therapy Facilty/Provider(s): [facility]  Reason for Treatment: Alcohol Use Use Prior Outpatient Therapy: Prior Outpatient Therapy: Yes Prior Therapy Dates: Current Prior Therapy Facilty/Provider(s): [facility] Reason for Treatment: SAIOP Does patient have an ACCT team?: No Does patient have Intensive In-House Services?  : No Does patient have [facility] services? : No Does patient have P4CC services?: No  Past Medical History:  Past Medical History:  Diagnosis Date  . Alcohol abuse   . Alcohol withdrawal ([place]) 11/12/2016  . Diabetes mellitus without complication ([place])   . DJD (  degenerative joint disease)   . Hypertension   . Myasthenia gravis ([place])   . Renal disorder     Past Surgical History:  Procedure  Laterality Date  . AMPUTATION TOE Left 12/29/2015   Procedure: AMPUTATION TOE;  Surgeon: Samara Deist, DPM;  Location: [facility] ORS;  Service: Podiatry;  Laterality: Left;  . PERIPHERAL VASCULAR CATHETERIZATION N/A 12/27/2015   Procedure: Lower Extremity Angiography;  Surgeon: Algernon Huxley, MD;  Location: [facility];  Service: Cardiovascular;  Laterality: N/A;   Family History:  Family History  Problem Relation Age of Onset  . Rheum arthritis Father    Family Psychiatric  History: Positive for alcohol abuse Social History:  History  Alcohol Use  . Yes    Comment: Pt states that last drink was today     History  Drug use: Unknown    Social History   Social History  . Marital status: Legally Separated    Spouse name: N/A  . Number of children: N/A  . Years of education: N/A   Social History Main Topics  . Smoking status: Never Smoker  . Smokeless tobacco: Never Used  . Alcohol use Yes     Comment: Pt states that last drink was today  . Drug use: Unknown  . Sexual activity: Not Asked   Other Topics Concern  . None   Social History Narrative  . None   Additional Social History:    Allergies:   Allergies  Allergen Reactions  . Other Other (See Comments)    Patient states he can't take any "mycin" medications because myasthenia gravis.    Labs:  Results for orders placed or performed during the hospital encounter of 02/24/17 (from the past 48 hour(s))  Comprehensive metabolic panel     Status: Abnormal   Collection Time: 02/24/17 10:44 PM  Result Value Ref Range   Sodium 139 135 - 145 mmol/L   Potassium 3.1 (L) 3.5 - 5.1 mmol/L   Chloride 99 (L) 101 - 111 mmol/L   CO2 23 22 - 32 mmol/L   Glucose, Bld 137 (H) 65 - 99 mg/dL   BUN 10 6 - 20 mg/dL   Creatinine, Ser 0.78 0.61 - 1.24 mg/dL   Calcium 8.5 (L) 8.9 - 10.3 mg/dL   Total Protein 7.0 6.5 - 8.1 g/dL   Albumin 4.0 3.5 - 5.0 g/dL   AST 49 (H) 15 - 41 U/L   ALT 28 17 - 63 U/L   Alkaline Phosphatase  77 38 - 126 U/L   Total Bilirubin 0.6 0.3 - 1.2 mg/dL   GFR calc non Af Amer >60 >60 mL/min   GFR calc Af Amer >60 >60 mL/min    Comment: (NOTE) The eGFR has been calculated using the CKD EPI equation. This calculation has not been validated in all clinical situations. eGFR's persistently <60 mL/min signify possible Chronic Kidney Disease.    Anion gap 17 (H) 5 - 15  Ethanol     Status: Abnormal   Collection Time: 02/24/17 10:44 PM  Result Value Ref Range   Alcohol, Ethyl (B) 475 (HH) <10 mg/dL    Comment: CRITICAL RESULT CALLED TO, READ BACK BY AND VERIFIED WITH APRIL BRUMGARD AT 2348 02/24/17.PMH        LOWEST DETECTABLE LIMIT FOR SERUM ALCOHOL IS 10 mg/dL FOR MEDICAL PURPOSES ONLY   cbc     Status: Abnormal   Collection Time: 02/24/17 10:44 PM  Result Value Ref Range   WBC 5.3 3.8 -  10.6 K/uL   RBC 4.39 (L) 4.40 - 5.90 MIL/uL   Hemoglobin 10.3 (L) 13.0 - 18.0 g/dL   HCT 32.5 (L) 40.0 - 52.0 %   MCV 74.0 (L) 80.0 - 100.0 fL   MCH 23.5 (L) 26.0 - 34.0 pg   MCHC 31.7 (L) 32.0 - 36.0 g/dL   RDW 20.7 (H) 11.5 - 14.5 %   Platelets 286 841 - 660 K/uL  Salicylate level     Status: None   Collection Time: 02/24/17 10:44 PM  Result Value Ref Range   Salicylate Lvl <6.3 2.8 - 30.0 mg/dL  Acetaminophen level     Status: Abnormal   Collection Time: 02/24/17 10:44 PM  Result Value Ref Range   Acetaminophen (Tylenol), Serum <10 (L) 10 - 30 ug/mL    Comment:        THERAPEUTIC CONCENTRATIONS VARY SIGNIFICANTLY. A RANGE OF 10-30 ug/mL MAY BE AN EFFECTIVE CONCENTRATION FOR MANY PATIENTS. HOWEVER, SOME ARE BEST TREATED AT CONCENTRATIONS OUTSIDE THIS RANGE. ACETAMINOPHEN CONCENTRATIONS >150 ug/mL AT 4 HOURS AFTER INGESTION AND >50 ug/mL AT 12 HOURS AFTER INGESTION ARE OFTEN ASSOCIATED WITH TOXIC REACTIONS.   Magnesium     Status: None   Collection Time: 02/24/17 10:44 PM  Result Value Ref Range   Magnesium 2.0 1.7 - 2.4 mg/dL  Urine Drug Screen, Qualitative     Status:  Abnormal   Collection Time: 02/25/17  4:53 AM  Result Value Ref Range   Tricyclic, Ur Screen NONE DETECTED NONE DETECTED   Amphetamines, Ur Screen NONE DETECTED NONE DETECTED   MDMA (Ecstasy)Ur Screen NONE DETECTED NONE DETECTED   Cocaine Metabolite,Ur [place] NONE DETECTED NONE DETECTED   Opiate, Ur Screen NONE DETECTED NONE DETECTED   Phencyclidine (PCP) Ur S NONE DETECTED NONE DETECTED   Cannabinoid 50 Ng, Ur [place] NONE DETECTED NONE DETECTED   Barbiturates, Ur Screen NONE DETECTED NONE DETECTED   Benzodiazepine, Ur Scrn POSITIVE (A) NONE DETECTED   Methadone Scn, Ur NONE DETECTED NONE DETECTED    Comment: (NOTE) 016  Tricyclics, urine               Cutoff 1000 ng/mL 200  Amphetamines, urine             Cutoff 1000 ng/mL 300  MDMA (Ecstasy), urine           Cutoff 500 ng/mL 400  Cocaine Metabolite, urine       Cutoff 300 ng/mL 500  Opiate, urine                   Cutoff 300 ng/mL 600  Phencyclidine (PCP), urine      Cutoff 25 ng/mL 700  Cannabinoid, urine              Cutoff 50 ng/mL 800  Barbiturates, urine             Cutoff 200 ng/mL 900  Benzodiazepine, urine           Cutoff 200 ng/mL 1000 Methadone, urine                Cutoff 300 ng/mL 1100 1200 The urine drug screen provides only a preliminary, unconfirmed 1300 analytical test result and should not be used for non-medical 1400 purposes. Clinical consideration and professional judgment should 1500 be applied to any positive drug screen result due to possible 1600 interfering substances. A more specific alternate chemical method 1700 must be used in order to obtain a confirmed analytical result.  1800  Gas chromato graphy / mass spectrometry (GC/MS) is the preferred 1900 confirmatory method.   Urinalysis, Complete w Microscopic     Status: Abnormal   Collection Time: 02/25/17  4:53 AM  Result Value Ref Range   Color, Urine YELLOW (A) YELLOW   APPearance CLEAR (A) CLEAR   Specific Gravity, Urine 1.008 1.005 - 1.030   pH 6.0  5.0 - 8.0   Glucose, UA NEGATIVE NEGATIVE mg/dL   Hgb urine dipstick NEGATIVE NEGATIVE   Bilirubin Urine NEGATIVE NEGATIVE   Ketones, ur 5 (A) NEGATIVE mg/dL   Protein, ur 30 (A) NEGATIVE mg/dL   Nitrite NEGATIVE NEGATIVE   Leukocytes, UA NEGATIVE NEGATIVE   RBC / HPF NONE SEEN 0 - 5 RBC/hpf   WBC, UA 0-5 0 - 5 WBC/hpf   Bacteria, UA NONE SEEN NONE SEEN   Squamous Epithelial / LPF NONE SEEN NONE SEEN   Mucus PRESENT   MRSA PCR Screening     Status: None   Collection Time: 02/25/17  2:23 PM  Result Value Ref Range   MRSA by PCR NEGATIVE NEGATIVE    Comment:        The GeneXpert MRSA Assay (FDA approved for NASAL specimens only), is one component of a comprehensive MRSA colonization surveillance program. It is not intended to diagnose MRSA infection nor to guide or monitor treatment for MRSA infections.   Glucose, capillary     Status: Abnormal   Collection Time: 02/25/17  2:26 PM  Result Value Ref Range   Glucose-Capillary 143 (H) 65 - 99 mg/dL  CBC     Status: Abnormal   Collection Time: 02/25/17  2:44 PM  Result Value Ref Range   WBC 5.6 3.8 - 10.6 K/uL   RBC 4.47 4.40 - 5.90 MIL/uL   Hemoglobin 10.1 (L) 13.0 - 18.0 g/dL   HCT 33.1 (L) 40.0 - 52.0 %   MCV 74.0 (L) 80.0 - 100.0 fL   MCH 22.6 (L) 26.0 - 34.0 pg   MCHC 30.5 (L) 32.0 - 36.0 g/dL   RDW 21.0 (H) 11.5 - 14.5 %   Platelets 237 150 - 440 K/uL  APTT     Status: None   Collection Time: 02/25/17  2:44 PM  Result Value Ref Range   aPTT 26 24 - 36 seconds  Protime-INR     Status: None   Collection Time: 02/25/17  2:44 PM  Result Value Ref Range   Prothrombin Time 11.8 11.4 - 15.2 seconds   INR 0.87   Troponin I     Status: Abnormal   Collection Time: 02/25/17  2:44 PM  Result Value Ref Range   Troponin I 0.03 (HH) <0.03 ng/mL    Comment: CRITICAL RESULT CALLED TO, READ BACK BY AND VERIFIED WITH SANDRA VORBA ON 02/25/17 [place] [address]   Glucose, capillary     Status: Abnormal   Collection Time: 02/25/17   4:26 PM  Result Value Ref Range   Glucose-Capillary 155 (H) 65 - 99 mg/dL  Troponin I     Status: Abnormal   Collection Time: 02/25/17  6:37 PM  Result Value Ref Range   Troponin I 0.03 (HH) <0.03 ng/mL    Comment: CRITICAL VALUE NOTED. VALUE IS CONSISTENT WITH PREVIOUSLY REPORTED/CALLED VALUE.PMH  Troponin I     Status: None   Collection Time: 02/25/17  9:15 PM  Result Value Ref Range   Troponin I <0.03 <0.03 ng/mL  Heparin level (unfractionated)     Status: None  Collection Time: 02/25/17  9:15 PM  Result Value Ref Range   Heparin Unfractionated 0.65 0.30 - 0.70 IU/mL    Comment:        IF HEPARIN RESULTS ARE BELOW EXPECTED VALUES, AND PATIENT DOSAGE HAS BEEN CONFIRMED, SUGGEST FOLLOW UP TESTING OF ANTITHROMBIN III LEVELS.   Comprehensive metabolic panel     Status: Abnormal   Collection Time: 02/25/17  9:15 PM  Result Value Ref Range   Sodium 137 135 - 145 mmol/L   Potassium 3.1 (L) 3.5 - 5.1 mmol/L   Chloride 96 (L) 101 - 111 mmol/L   CO2 26 22 - 32 mmol/L   Glucose, Bld 141 (H) 65 - 99 mg/dL   BUN 12 6 - 20 mg/dL   Creatinine, Ser 0.77 0.61 - 1.24 mg/dL   Calcium 8.8 (L) 8.9 - 10.3 mg/dL   Total Protein 6.7 6.5 - 8.1 g/dL   Albumin 3.9 3.5 - 5.0 g/dL   AST 42 (H) 15 - 41 U/L   ALT 26 17 - 63 U/L   Alkaline Phosphatase 70 38 - 126 U/L   Total Bilirubin 1.0 0.3 - 1.2 mg/dL   GFR calc non Af Amer >60 >60 mL/min   GFR calc Af Amer >60 >60 mL/min    Comment: (NOTE) The eGFR has been calculated using the CKD EPI equation. This calculation has not been validated in all clinical situations. eGFR's persistently <60 mL/min signify possible Chronic Kidney Disease.    Anion gap 15 5 - 15  Magnesium     Status: Abnormal   Collection Time: 02/25/17  9:15 PM  Result Value Ref Range   Magnesium 1.4 (L) 1.7 - 2.4 mg/dL  Phosphorus     Status: None   Collection Time: 02/25/17  9:15 PM  Result Value Ref Range   Phosphorus 3.6 2.5 - 4.6 mg/dL  Glucose, capillary     Status:  Abnormal   Collection Time: 02/25/17 10:12 PM  Result Value Ref Range   Glucose-Capillary 148 (H) 65 - 99 mg/dL   Comment 1 Notify RN    Comment 2 Document in Chart   Basic metabolic panel     Status: Abnormal   Collection Time: 02/26/17  4:06 AM  Result Value Ref Range   Sodium 136 135 - 145 mmol/L   Potassium 3.3 (L) 3.5 - 5.1 mmol/L   Chloride 97 (L) 101 - 111 mmol/L   CO2 27 22 - 32 mmol/L   Glucose, Bld 152 (H) 65 - 99 mg/dL   BUN 12 6 - 20 mg/dL   Creatinine, Ser 0.78 0.61 - 1.24 mg/dL   Calcium 8.9 8.9 - 10.3 mg/dL   GFR calc non Af Amer >60 >60 mL/min   GFR calc Af Amer >60 >60 mL/min    Comment: (NOTE) The eGFR has been calculated using the CKD EPI equation. This calculation has not been validated in all clinical situations. eGFR's persistently <60 mL/min signify possible Chronic Kidney Disease.    Anion gap 12 5 - 15  CBC     Status: Abnormal   Collection Time: 02/26/17  4:06 AM  Result Value Ref Range   WBC 5.1 3.8 - 10.6 K/uL   RBC 4.02 (L) 4.40 - 5.90 MIL/uL   Hemoglobin 9.1 (L) 13.0 - 18.0 g/dL   HCT 29.9 (L) 40.0 - 52.0 %   MCV 74.5 (L) 80.0 - 100.0 fL   MCH 22.6 (L) 26.0 - 34.0 pg   MCHC 30.4 (L) 32.0 -  36.0 g/dL   RDW 21.0 (H) 11.5 - 14.5 %   Platelets 218 150 - 440 K/uL  Heparin level (unfractionated)     Status: None   Collection Time: 02/26/17  4:06 AM  Result Value Ref Range   Heparin Unfractionated 0.53 0.30 - 0.70 IU/mL    Comment:        IF HEPARIN RESULTS ARE BELOW EXPECTED VALUES, AND PATIENT DOSAGE HAS BEEN CONFIRMED, SUGGEST FOLLOW UP TESTING OF ANTITHROMBIN III LEVELS.   Magnesium     Status: Abnormal   Collection Time: 02/26/17  4:06 AM  Result Value Ref Range   Magnesium 1.4 (L) 1.7 - 2.4 mg/dL  Phosphorus     Status: None   Collection Time: 02/26/17  4:06 AM  Result Value Ref Range   Phosphorus 3.6 2.5 - 4.6 mg/dL  Glucose, capillary     Status: Abnormal   Collection Time: 02/26/17  7:17 AM  Result Value Ref Range    Glucose-Capillary 156 (H) 65 - 99 mg/dL  Glucose, capillary     Status: Abnormal   Collection Time: 02/26/17 10:59 AM  Result Value Ref Range   Glucose-Capillary 113 (H) 65 - 99 mg/dL  Glucose, capillary     Status: Abnormal   Collection Time: 02/26/17  3:40 PM  Result Value Ref Range   Glucose-Capillary 125 (H) 65 - 99 mg/dL    Current Facility-Administered Medications  Medication Dose Route Frequency Provider Last Rate Last Dose  . acetaminophen (TYLENOL) tablet 650 mg  650 mg Oral Q6H PRN Henreitta Leber, MD       Or  . acetaminophen (TYLENOL) suppository 650 mg  650 mg Rectal Q6H PRN Henreitta Leber, MD      . citalopram (CELEXA) tablet 20 mg  20 mg Oral Daily Henreitta Leber, MD   20 mg at 02/26/17 1100  . dexmedetomidine (PRECEDEX) 400 MCG/100ML (4 mcg/mL) infusion  0-1.2 mcg/kg/hr Intravenous Titrated Tukov, Magadalene S, NP 12.4 mL/hr at 02/26/17 1500 0.6 mcg/kg/hr at 02/26/17 1500  . diltiazem (CARDIZEM) 100 mg in dextrose 5 % 100 mL (1 mg/mL) infusion  5-15 mg/hr Intravenous Continuous Henreitta Leber, MD   Stopped at 02/26/17 1428  . folic acid (FOLVITE) tablet 1 mg  1 mg Oral Daily Hinda Kehr, MD   1 mg at 02/26/17 1100  . furosemide (LASIX) tablet 40 mg  40 mg Oral Daily PRN Henreitta Leber, MD      . gabapentin (NEURONTIN) capsule 600 mg  600 mg Oral TID Henreitta Leber, MD   600 mg at 02/26/17 1100  . heparin ADULT infusion 100 units/mL (25000 units/265m sodium chloride 0.45%)  1,350 Units/hr Intravenous Continuous MMarcelle OverlieD, RPH 13.5 mL/hr at 02/26/17 1500 1,350 Units/hr at 02/26/17 1500  . insulin aspart (novoLOG) injection 0-5 Units  0-5 Units Subcutaneous QHS Sainani, Vivek J, MD      . insulin aspart (novoLOG) injection 0-9 Units  0-9 Units Subcutaneous TID WC SHenreitta Leber MD   2 Units at 02/26/17 0736  . lisinopril (PRINIVIL,ZESTRIL) tablet 10 mg  10 mg Oral Daily SHenreitta Leber MD   10 mg at 02/25/17 1520  . [START ON 02/27/2017] LORazepam  (ATIVAN) injection 0-4 mg  0-4 mg Intravenous Q12H FHinda Kehr MD       Or  . [Derrill MemoON 02/27/2017] LORazepam (ATIVAN) tablet 0-4 mg  0-4 mg Oral Q12H FHinda Kehr MD      . LORazepam (ATIVAN) injection 0-4 mg  0-4 mg Intravenous Q6H Hinda Kehr, MD   4 mg at 02/26/17 1100   Or  . LORazepam (ATIVAN) tablet 0-4 mg  0-4 mg Oral Q6H Hinda Kehr, MD   2 mg at 02/25/17 1020  . ondansetron (ZOFRAN) tablet 4 mg  4 mg Oral Q6H PRN Henreitta Leber, MD       Or  . ondansetron (ZOFRAN) injection 4 mg  4 mg Intravenous Q6H PRN Henreitta Leber, MD      . oxyCODONE (Oxy IR/ROXICODONE) immediate release tablet 2.5 mg  2.5 mg Oral TID Flora Lipps, MD   2.5 mg at 02/26/17 1105  . oxyCODONE-acetaminophen (PERCOCET/ROXICET) 5-325 MG per tablet 1-2 tablet  1-2 tablet Oral Q6H PRN Wilhelmina Mcardle, MD   1 tablet at 02/26/17 0242  . pneumococcal 23 valent vaccine (PNU-IMMUNE) injection 0.5 mL  0.5 mL Intramuscular Tomorrow-1000 Wilhelmina Mcardle, MD   Stopped at 02/26/17 1000  . potassium chloride SA (K-DUR,KLOR-CON) CR tablet 40 mEq  40 mEq Oral BID Gladstone Lighter, MD   40 mEq at 02/26/17 1100  . thiamine (VITAMIN B-1) tablet 100 mg  100 mg Oral Daily Hinda Kehr, MD   100 mg at 02/26/17 1100  . vitamin B-12 (CYANOCOBALAMIN) tablet 1,000 mcg  1,000 mcg Oral Daily Dorene Sorrow S, NP   1,000 mcg at 02/26/17 1100    Musculoskeletal: Strength & Muscle Tone: within normal limits Gait & Station: unable to stand Patient leans: N/A  Psychiatric Specialty Exam: Physical Exam  Nursing note and vitals reviewed. Constitutional: He appears well-developed and well-nourished.  HENT:  Head: Normocephalic and atraumatic.  Eyes: Pupils are equal, round, and reactive to light. Conjunctivae are normal.  Neck: Normal range of motion.  Cardiovascular: Regular rhythm and normal heart sounds.   Respiratory: Effort normal. No respiratory distress.  GI: Soft.  Musculoskeletal: Normal range of motion.   Neurological: He is alert.  Skin: Skin is warm and dry.  Psychiatric: His affect is blunt. He is noncommunicative.    Review of Systems  Unable to perform ROS: Medical condition    Blood pressure (!) 87/59, pulse 68, temperature 98.1 F (36.7 C), temperature source Oral, resp. rate 20, height '5\' 10"'$  (1.778 m), weight 182 lb 1.6 oz (82.6 kg), SpO2 94 %.Body mass index is 26.13 kg/m.  General Appearance: Casual  Eye Contact:  None  Speech:  Negative  Volume:  Decreased  Mood:  Negative  Affect:  Negative  Thought Process:  NA  Orientation:  Negative  Thought Content:  Negative  Suicidal Thoughts:  No  Homicidal Thoughts:  No  Memory:  Immediate;   Negative Recent;   Negative  Judgement:  Negative  Insight:  Negative  Psychomotor Activity:  Negative  Concentration:  Concentration: Negative  Recall:  Negative  Fund of Knowledge:  Negative  Language:  Negative  Akathisia:  Negative  Handed:  Right  AIMS (if indicated):     Assets:  Resilience  ADL's:  Impaired  Cognition:  Impaired,  Moderate  Sleep:        Treatment Plan Summary: Medication management and Plan 56 year old man currently in the intensive care unit having management for delirium tremens.  Patient was not easily arousable today and I did not see any point in forcing him to wake up.  Continue Precedex drip and when it is weaned off and he can communicate I can talk with him about substance abuse treatment.  I will follow up as needed.  Disposition: No  evidence of imminent risk to self or others at present.   Patient does not meet criteria for psychiatric inpatient admission.  Alethia Berthold, MD 02/26/2017 5:28 PM

## 2017-02-26 NOTE — Progress Notes (Signed)
PULMONARY / CRITICAL CARE MEDICINE   Name: Kurt Horton MRN: [id number] DOB: 1960/07/28    ADMISSION DATE:  02/24/2017   CONSULTATION DATE: 02/25/2017  REFERRING MD: Dr. Cherlynn Kaiser  Reason: ICU management of EtOH intoxication and possible EtOH withdrawal  CHIEF COMPLAINT: EtOH intoxication  HISTORY OF PRESENT ILLNESS:   This is a 56 year old Caucasian male with a past medical history as indicated below who presented to the ED with complaints of EtOH intoxication, low back pain and suicidal ideation.  He was placed on involuntary commitment by the ED but this has that has been discontinued by psychiatry after evaluation.  Patient states that he was in rehab for alcohol abuse when he relapsed.  He states that his last drink was 72 hours ago but it is unclear if this is accurate since he presented to the ED with actual intoxication.  Alcohol level in the ED was 475 and he was found to be in A. fib with RVR with heart rates in the 150s.  He also indicated in the ED that he wanted to kill himself hence he was placed on an involuntary commitment.  He currently denies any suicidal ideation.  He is being admitted to the ICU for further management of EtOH withdrawal stop SUBJECTIVE: Insomniac overnight.  Remains on Precedex.  No agitation no acute withdrawal were noted  VITAL SIGNS: BP 103/60   Pulse 87   Temp 98.6 F (37 C) (Oral)   Resp 12   Ht 5\' 10"  (1.778 m)   Wt 182 lb 1.6 oz (82.6 kg)   SpO2 99%   BMI 26.13 kg/m   HEMODYNAMICS:    VENTILATOR SETTINGS:    INTAKE / OUTPUT: I/O last 3 completed shifts: In: -  Out: 520 [Urine:520]  PHYSICAL EXAMINATION: General: No acute distress Neuro: Alert and oriented x2, fine tremors in bilateral upper extremities normal, slowly delayed verbal responses, moves all extremities HEENT: PERRLA, trachea midline, no JVD Cardiovascular: Apical tachycardic, irregular irregular, S1-S2, normal medical showed a gallop, +2 pulses, no edema Lungs:  Clear to auscultation bilaterally Abdomen: Normal bowel sounds, palpation reveals Musculoskeletal: Positive range of motion, no deformities Skin: Warm and dry, no cyanosis  LABS:  BMET  Recent Labs Lab 02/24/17 2244 02/25/17 2115 02/26/17 0406  NA 139 137 136  K 3.1* 3.1* 3.3*  CL 99* 96* 97*  CO2 23 26 27   BUN 10 12 12   CREATININE 0.78 0.77 0.78  GLUCOSE 137* 141* 152*    Electrolytes  Recent Labs Lab 02/24/17 2244 02/25/17 2115 02/26/17 0406  CALCIUM 8.5* 8.8* 8.9  MG 2.0 1.4*  --   PHOS  --  3.6  --     CBC  Recent Labs Lab 02/24/17 2244 02/25/17 1444 02/26/17 0406  WBC 5.3 5.6 5.1  HGB 10.3* 10.1* 9.1*  HCT 32.5* 33.1* 29.9*  PLT 286 237 218    Coag's  Recent Labs Lab 02/25/17 1444  APTT 26  INR 0.87    Sepsis Markers No results for input(s): LATICACIDVEN, PROCALCITON, O2SATVEN in the last 168 hours.  ABG No results for input(s): PHART, PCO2ART, PO2ART in the last 168 hours.  Liver Enzymes  Recent Labs Lab 02/24/17 2244 02/25/17 2115  AST 49* 42*  ALT 28 26  ALKPHOS 77 70  BILITOT 0.6 1.0  ALBUMIN 4.0 3.9    Cardiac Enzymes  Recent Labs Lab 02/25/17 1444 02/25/17 1837 02/25/17 2115  TROPONINI 0.03* 0.03* <0.03    Glucose  Recent Labs Lab 02/25/17 1426  02/25/17 1626 02/25/17 2212  GLUCAP 143* 155* 148*    Imaging No results found.   STUDIES:  2D echo pending  CULTURES: None  ANTIBIOTICS: None  SIGNIFICANT EVENTS: 02/24/2017: Admitted  LINES/TUBES: Peripheral IV  DISCUSSION: 56 year old Caucasian male admitted with alcohol intoxication, high risk for alcohol withdrawal, hypomagnesemia and hypokalemia  ASSESSMENT A. fib with RVR Acute alcohol intoxication High risk for alcohol withdrawal  Alcohol abuse Electrolyte imbalances History of myasthenia gravis, type 2 diabetes and hypertension  PLAN Continue hemodynamic monitoring per ICU protocol Continue CIWA protocol Continue diltiazem  infusion and titrate to keep heart rate less than 100 Thiamine, folic acid, and vitamin B12 supplementation Continue heparin per A. fib protocol Continue Precedex infusion for withdrawal  symptoms IV fluids Follow-up 2D echo Cycle troponin Oxycodone as needed for pain Zofran for nausea As needed lorazepam for nausea and vomiting Psychiatric consult Monitor and correct electrolytes Continue all home medications FAMILY  - Updates: Patient updated at bedside.  Patient is a full code  - Inter-disciplinary family meet or Palliative Care meeting due by:  day 7   Magdalene S. [facility] ANP-BC Pulmonary and Critical Care Medicine [facility] Pager [phone] or [phone] 02/26/2017, 6:44 AM

## 2017-02-26 NOTE — Progress Notes (Signed)
ANTICOAGULATION CONSULT NOTE - Follow Up Consult  Pharmacy Consult for Heparin  Indication: atrial fibrillation  Allergies  Allergen Reactions  . Other Other (See Comments)    Patient states he can't take any "mycin" medications because myasthenia gravis.    Patient Measurements: Height: 5\' 10"  (177.8 cm) Weight: 182 lb 1.6 oz (82.6 kg) IBW/kg (Calculated) : 73 Heparin Dosing Weight:   Vital Signs: Temp: 98.6 F (37 C) (10/21 2000) Temp Source: Oral (10/21 2000) BP: 103/60 (10/22 0100) Pulse Rate: 87 (10/22 0100)  Labs:  Recent Labs  02/24/17 2244 02/25/17 1444 02/25/17 1837 02/25/17 2115 02/26/17 0406  HGB 10.3* 10.1*  --   --  9.1*  HCT 32.5* 33.1*  --   --  29.9*  PLT 286 237  --   --  218  APTT  --  26  --   --   --   LABPROT  --  11.8  --   --   --   INR  --  0.87  --   --   --   HEPARINUNFRC  --   --   --  0.65 0.53  CREATININE 0.78  --   --  0.77 0.78  TROPONINI  --  0.03* 0.03* <0.03  --     Estimated Creatinine Clearance: 106.5 mL/min (by C-G formula based on SCr of 0.78 mg/dL).   Medications:  Prescriptions Prior to Admission  Medication Sig Dispense Refill Last Dose  . citalopram (CELEXA) 20 MG tablet Take 1 tablet (20 mg total) by mouth daily. 30 tablet 5 Past Week at Unknown time  . furosemide (LASIX) 40 MG tablet Take 40 mg by mouth daily as needed for fluid (if taking prednisone).    Past Week at Unknown time  . gabapentin (NEURONTIN) 300 MG capsule Take 2 capsules (600 mg total) by mouth 3 (three) times daily. 180 capsule 5 Past Week at Unknown time  . lisinopril (PRINIVIL,ZESTRIL) 10 MG tablet Take 1 tablet (10 mg total) by mouth daily. 30 tablet 0 Past Week at Unknown time  . metFORMIN (GLUCOPHAGE) 500 MG tablet Take 1 tablet (500 mg total) by mouth daily. 30 tablet 0 Past Week at Unknown time  . mycophenolate (CELLCEPT) 500 MG tablet Take one pill in the morning and two pills at night. 90 tablet 5 Past Week at Unknown time  . ALPRAZolam  (XANAX) 0.5 MG tablet Take 1 or 2 tablets 30 min. prior to MRI.  You must not drink alcohol, drive, or operate dangerous equipment after taking this medication. (Patient not taking: Reported on 02/25/2017) 2 tablet 0 Not Taking at Unknown time  . clonazePAM (KLONOPIN) 0.5 MG tablet Take 1 tablet (0.5 mg total) by mouth 2 (two) times daily as needed for anxiety. (Patient not taking: Reported on 02/25/2017) 30 tablet 0 Not Taking at Unknown time  . diltiazem (CARDIZEM CD) 120 MG 24 hr capsule Take 1 capsule (120 mg total) by mouth daily. (Patient not taking: Reported on 02/25/2017) 30 capsule 0 Not Taking at Unknown time  . fentaNYL (DURAGESIC - DOSED MCG/HR) 50 MCG/HR Place 1 patch (50 mcg total) onto the skin every 3 (three) days. (Patient not taking: Reported on 08/24/2016) 10 patch 0 02/23/2017  . lidocaine-prilocaine (EMLA) cream Apply 1 application topically as needed. Apply to feet twice daily.   prn at prn  . metoprolol tartrate (LOPRESSOR) 25 MG tablet Take 1 tablet (25 mg total) by mouth 2 (two) times daily. (Patient not taking: Reported on 02/25/2017)  60 tablet 0 Not Taking at Unknown time  . oxyCODONE (OXY IR/ROXICODONE) 5 MG immediate release tablet Take 1 tablet (5 mg total) by mouth every 8 (eight) hours as needed. 90 tablet 0 prn at prn  . predniSONE (DELTASONE) 10 MG tablet Take 6 po x 2d, then 5 po x 2d, then 4 po x 2d, then 3 po x 2d, then 2 po x 2d, then 1 po x 2d 42 tablet 0 prn at prn  . SUMAtriptan (IMITREX) 100 MG tablet Take 1 tablet (100 mg total) by mouth every 2 (two) hours as needed for migraine. May repeat in 2 hours if headache persists or recurs. (Patient not taking: Reported on 08/24/2016) 6 tablet 0 prn at prn  . zolpidem (AMBIEN) 10 MG tablet Take 1 tablet (10 mg total) by mouth at bedtime as needed for sleep. (Patient not taking: Reported on 02/25/2017) 15 tablet 0 Not Taking at Unknown time    Assessment: CrCl = 82.6 ml/min  Goal of Therapy:  Heparin level 0.3-0.7  units/ml Monitor platelets by anticoagulation protocol: Yes   Plan:  10/22 :  HL @ 400 = 0.53 Will continue this pt on current rate and recheck HL on 10/23 with AM labs.   Kurt Horton D 02/26/2017,5:04 AM

## 2017-02-26 NOTE — Progress Notes (Signed)
Notified patient that he has not urinated since this am.  Encouraged him to urinate and provided him with water.  Continue to monitor.

## 2017-02-26 NOTE — Consult Note (Signed)
Kurt Horton is a 56 y.o. male  [id number]  Primary Cardiologist: Dr. [name] Reason for Consultation: Afib  HPI: 56yo male with a history of ETOH abuse and withdrawal, diabetes, and HTN Pt presented to ED with alcohol detox. EKG showed SVT in 180s and was given multiple doses of adenosine and the noted to be in Afib 130s-140s. He was admitted for management of his ETOH withdrawal and afib.   Review of Systems: Pt reports back pain but no chest pain, palpitations, or shortness of breath.    Past Medical History:  Diagnosis Date  . Alcohol abuse   . Alcohol withdrawal (HCC) 11/12/2016  . Diabetes mellitus without complication (HCC)   . DJD (degenerative joint disease)   . Hypertension   . Myasthenia gravis (HCC)   . Renal disorder     Medications Prior to Admission  Medication Sig Dispense Refill  . citalopram (CELEXA) 20 MG tablet Take 1 tablet (20 mg total) by mouth daily. 30 tablet 5  . furosemide (LASIX) 40 MG tablet Take 40 mg by mouth daily as needed for fluid (if taking prednisone).     . gabapentin (NEURONTIN) 300 MG capsule Take 2 capsules (600 mg total) by mouth 3 (three) times daily. 180 capsule 5  . lisinopril (PRINIVIL,ZESTRIL) 10 MG tablet Take 1 tablet (10 mg total) by mouth daily. 30 tablet 0  . metFORMIN (GLUCOPHAGE) 500 MG tablet Take 1 tablet (500 mg total) by mouth daily. 30 tablet 0  . mycophenolate (CELLCEPT) 500 MG tablet Take one pill in the morning and two pills at night. 90 tablet 5  . ALPRAZolam (XANAX) 0.5 MG tablet Take 1 or 2 tablets 30 min. prior to MRI.  You must not drink alcohol, drive, or operate dangerous equipment after taking this medication. (Patient not taking: Reported on 02/25/2017) 2 tablet 0  . clonazePAM (KLONOPIN) 0.5 MG tablet Take 1 tablet (0.5 mg total) by mouth 2 (two) times daily as needed for anxiety. (Patient not taking: Reported on 02/25/2017) 30 tablet 0  . diltiazem (CARDIZEM CD) 120 MG 24 hr capsule Take 1 capsule (120  mg total) by mouth daily. (Patient not taking: Reported on 02/25/2017) 30 capsule 0  . fentaNYL (DURAGESIC - DOSED MCG/HR) 50 MCG/HR Place 1 patch (50 mcg total) onto the skin every 3 (three) days. (Patient not taking: Reported on 08/24/2016) 10 patch 0  . lidocaine-prilocaine (EMLA) cream Apply 1 application topically as needed. Apply to feet twice daily.    . metoprolol tartrate (LOPRESSOR) 25 MG tablet Take 1 tablet (25 mg total) by mouth 2 (two) times daily. (Patient not taking: Reported on 02/25/2017) 60 tablet 0  . oxyCODONE (OXY IR/ROXICODONE) 5 MG immediate release tablet Take 1 tablet (5 mg total) by mouth every 8 (eight) hours as needed. 90 tablet 0  . predniSONE (DELTASONE) 10 MG tablet Take 6 po x 2d, then 5 po x 2d, then 4 po x 2d, then 3 po x 2d, then 2 po x 2d, then 1 po x 2d 42 tablet 0  . SUMAtriptan (IMITREX) 100 MG tablet Take 1 tablet (100 mg total) by mouth every 2 (two) hours as needed for migraine. May repeat in 2 hours if headache persists or recurs. (Patient not taking: Reported on 08/24/2016) 6 tablet 0  . zolpidem (AMBIEN) 10 MG tablet Take 1 tablet (10 mg total) by mouth at bedtime as needed for sleep. (Patient not taking: Reported on 02/25/2017) 15 tablet 0     .  citalopram  20 mg Oral Daily  . folic acid  1 mg Oral Daily  . gabapentin  600 mg Oral TID  . insulin aspart  0-5 Units Subcutaneous QHS  . insulin aspart  0-9 Units Subcutaneous TID WC  . lisinopril  10 mg Oral Daily  . [START ON 02/27/2017] LORazepam  0-4 mg Intravenous Q12H   Or  . [START ON 02/27/2017] LORazepam  0-4 mg Oral Q12H  . LORazepam  0-4 mg Intravenous Q6H   Or  . LORazepam  0-4 mg Oral Q6H  . pneumococcal 23 valent vaccine  0.5 mL Intramuscular Tomorrow-1000  . potassium chloride  40 mEq Oral BID  . thiamine  100 mg Oral Daily  . vitamin B-12  1,000 mcg Oral Daily    Infusions: . dexmedetomidine (PRECEDEX) IV infusion 0.4 mcg/kg/hr (02/26/17 0525)  . diltiazem (CARDIZEM) infusion 10  mg/hr (02/26/17 0525)  . heparin 1,350 Units/hr (02/26/17 0341)  . magnesium sulfate 1 - 4 g bolus IVPB      Allergies  Allergen Reactions  . Other Other (See Comments)    Patient states he can't take any "mycin" medications because myasthenia gravis.    Social History   Social History  . Marital status: Legally Separated    Spouse name: N/A  . Number of children: N/A  . Years of education: N/A   Occupational History  . Not on file.   Social History Main Topics  . Smoking status: Never Smoker  . Smokeless tobacco: Never Used  . Alcohol use Yes     Comment: Pt states that last drink was today  . Drug use: Unknown  . Sexual activity: Not on file   Other Topics Concern  . Not on file   Social History Narrative  . No narrative on file    Family History  Problem Relation Age of Onset  . Rheum arthritis Father     PHYSICAL EXAM: Vitals:   02/26/17 0500 02/26/17 0600  BP: (!) 89/39 (!) 116/55  Pulse: 83 91  Resp: 12   Temp:    SpO2: 97% 96%     Intake/Output Summary (Last 24 hours) at 02/26/17 1001 Last data filed at 02/26/17 0715  Gross per 24 hour  Intake           287.32 ml  Output              715 ml  Net          -427.68 ml    General:  Lethargic, No respiratory difficulty HEENT: normal Neck: supple. no JVD. Carotids 2+ bilat; no bruits. No lymphadenopathy or thryomegaly appreciated. Cor: PMI nondisplaced. Regular rate & rhythm. No rubs, gallops or murmurs. Lungs: Faint crackles to ausculation. Abdomen: soft, nontender, nondistended.  Extremities: no cyanosis, clubbing, rash, edema Neuro: Lethargic, responds appropriately to questions.  ECG: Sinus rhythm 75bpm  Results for orders placed or performed during the hospital encounter of 02/24/17 (from the past 24 hour(s))  MRSA PCR Screening     Status: None   Collection Time: 02/25/17  2:23 PM  Result Value Ref Range   MRSA by PCR NEGATIVE NEGATIVE  Glucose, capillary     Status: Abnormal    Collection Time: 02/25/17  2:26 PM  Result Value Ref Range   Glucose-Capillary 143 (H) 65 - 99 mg/dL  CBC     Status: Abnormal   Collection Time: 02/25/17  2:44 PM  Result Value Ref Range   WBC 5.6 3.8 - 10.6  K/uL   RBC 4.47 4.40 - 5.90 MIL/uL   Hemoglobin 10.1 (L) 13.0 - 18.0 g/dL   HCT 16.133.1 (L) 09.640.0 - 04.552.0 %   MCV 74.0 (L) 80.0 - 100.0 fL   MCH 22.6 (L) 26.0 - 34.0 pg   MCHC 30.5 (L) 32.0 - 36.0 g/dL   RDW 40.921.0 (H) 81.111.5 - 91.414.5 %   Platelets 237 150 - 440 K/uL  APTT     Status: None   Collection Time: 02/25/17  2:44 PM  Result Value Ref Range   aPTT 26 24 - 36 seconds  Protime-INR     Status: None   Collection Time: 02/25/17  2:44 PM  Result Value Ref Range   Prothrombin Time 11.8 11.4 - 15.2 seconds   INR 0.87   Troponin I     Status: Abnormal   Collection Time: 02/25/17  2:44 PM  Result Value Ref Range   Troponin I 0.03 (HH) <0.03 ng/mL  Glucose, capillary     Status: Abnormal   Collection Time: 02/25/17  4:26 PM  Result Value Ref Range   Glucose-Capillary 155 (H) 65 - 99 mg/dL  Troponin I     Status: Abnormal   Collection Time: 02/25/17  6:37 PM  Result Value Ref Range   Troponin I 0.03 (HH) <0.03 ng/mL  Troponin I     Status: None   Collection Time: 02/25/17  9:15 PM  Result Value Ref Range   Troponin I <0.03 <0.03 ng/mL  Heparin level (unfractionated)     Status: None   Collection Time: 02/25/17  9:15 PM  Result Value Ref Range   Heparin Unfractionated 0.65 0.30 - 0.70 IU/mL  Comprehensive metabolic panel     Status: Abnormal   Collection Time: 02/25/17  9:15 PM  Result Value Ref Range   Sodium 137 135 - 145 mmol/L   Potassium 3.1 (L) 3.5 - 5.1 mmol/L   Chloride 96 (L) 101 - 111 mmol/L   CO2 26 22 - 32 mmol/L   Glucose, Bld 141 (H) 65 - 99 mg/dL   BUN 12 6 - 20 mg/dL   Creatinine, Ser 7.820.77 0.61 - 1.24 mg/dL   Calcium 8.8 (L) 8.9 - 10.3 mg/dL   Total Protein 6.7 6.5 - 8.1 g/dL   Albumin 3.9 3.5 - 5.0 g/dL   AST 42 (H) 15 - 41 U/L   ALT 26 17 - 63 U/L    Alkaline Phosphatase 70 38 - 126 U/L   Total Bilirubin 1.0 0.3 - 1.2 mg/dL   GFR calc non Af Amer >60 >60 mL/min   GFR calc Af Amer >60 >60 mL/min   Anion gap 15 5 - 15  Magnesium     Status: Abnormal   Collection Time: 02/25/17  9:15 PM  Result Value Ref Range   Magnesium 1.4 (L) 1.7 - 2.4 mg/dL  Phosphorus     Status: None   Collection Time: 02/25/17  9:15 PM  Result Value Ref Range   Phosphorus 3.6 2.5 - 4.6 mg/dL  Glucose, capillary     Status: Abnormal   Collection Time: 02/25/17 10:12 PM  Result Value Ref Range   Glucose-Capillary 148 (H) 65 - 99 mg/dL   Comment 1 Notify RN    Comment 2 Document in Chart   Basic metabolic panel     Status: Abnormal   Collection Time: 02/26/17  4:06 AM  Result Value Ref Range   Sodium 136 135 - 145 mmol/L   Potassium 3.3 (L)  3.5 - 5.1 mmol/L   Chloride 97 (L) 101 - 111 mmol/L   CO2 27 22 - 32 mmol/L   Glucose, Bld 152 (H) 65 - 99 mg/dL   BUN 12 6 - 20 mg/dL   Creatinine, Ser 1.61 0.61 - 1.24 mg/dL   Calcium 8.9 8.9 - 09.6 mg/dL   GFR calc non Af Amer >60 >60 mL/min   GFR calc Af Amer >60 >60 mL/min   Anion gap 12 5 - 15  CBC     Status: Abnormal   Collection Time: 02/26/17  4:06 AM  Result Value Ref Range   WBC 5.1 3.8 - 10.6 K/uL   RBC 4.02 (L) 4.40 - 5.90 MIL/uL   Hemoglobin 9.1 (L) 13.0 - 18.0 g/dL   HCT 04.5 (L) 40.9 - 81.1 %   MCV 74.5 (L) 80.0 - 100.0 fL   MCH 22.6 (L) 26.0 - 34.0 pg   MCHC 30.4 (L) 32.0 - 36.0 g/dL   RDW 91.4 (H) 78.2 - 95.6 %   Platelets 218 150 - 440 K/uL  Heparin level (unfractionated)     Status: None   Collection Time: 02/26/17  4:06 AM  Result Value Ref Range   Heparin Unfractionated 0.53 0.30 - 0.70 IU/mL  Magnesium     Status: Abnormal   Collection Time: 02/26/17  4:06 AM  Result Value Ref Range   Magnesium 1.4 (L) 1.7 - 2.4 mg/dL  Glucose, capillary     Status: Abnormal   Collection Time: 02/26/17  7:17 AM  Result Value Ref Range   Glucose-Capillary 156 (H) 65 - 99 mg/dL   No results  found.   ASSESSMENT AND PLAN: EtOH withdrawal with episode of SVT and atrial fibrillation RVR, now in normal sinus rhythm 75bpm. Blood pressures are labile, currently very low, hold antihypertensives. Continue diltiazem drip and monitor heart rate. Awaiting echo results. Will continue to follow.   Caroleen Hamman,  NP-C

## 2017-02-27 DIAGNOSIS — I4891 Unspecified atrial fibrillation: Secondary | ICD-10-CM

## 2017-02-27 LAB — ECHOCARDIOGRAM COMPLETE
HEIGHTINCHES: 70 in
Weight: 2913.6 oz

## 2017-02-27 LAB — BASIC METABOLIC PANEL
Anion gap: 6 (ref 5–15)
BUN: 11 mg/dL (ref 6–20)
CHLORIDE: 101 mmol/L (ref 101–111)
CO2: 27 mmol/L (ref 22–32)
Calcium: 9 mg/dL (ref 8.9–10.3)
Creatinine, Ser: 0.86 mg/dL (ref 0.61–1.24)
GFR calc non Af Amer: >60 mL/min (ref >60)
Glucose, Bld: 102 mg/dL — ABNORMAL HIGH (ref 65–99)
POTASSIUM: 3.9 mmol/L (ref 3.5–5.1)
SODIUM: 134 mmol/L — AB (ref 135–145)

## 2017-02-27 LAB — GLUCOSE, CAPILLARY
GLUCOSE-CAPILLARY: 102 mg/dL — AB (ref 65–99)
GLUCOSE-CAPILLARY: 103 mg/dL — AB (ref 65–99)
Glucose-Capillary: 127 mg/dL — ABNORMAL HIGH (ref 65–99)

## 2017-02-27 LAB — MAGNESIUM: MAGNESIUM: 2.2 mg/dL (ref 1.7–2.4)

## 2017-02-27 LAB — PHOSPHORUS: PHOSPHORUS: 4.2 mg/dL (ref 2.5–4.6)

## 2017-02-27 LAB — HEPARIN LEVEL (UNFRACTIONATED): Heparin Unfractionated: 0.52 IU/mL (ref 0.30–0.70)

## 2017-02-27 MED ORDER — LORAZEPAM 2 MG/ML IJ SOLN
INTRAMUSCULAR | Status: AC
  Administered 2017-02-27: 1 mg via INTRAVENOUS
  Filled 2017-02-27: qty 1

## 2017-02-27 MED ORDER — CARVEDILOL 3.125 MG PO TABS
6.2500 mg | ORAL_TABLET | Freq: Two times a day (BID) | ORAL | Status: DC
  Administered 2017-02-27 – 2017-03-01 (×4): 6.25 mg via ORAL
  Filled 2017-02-27: qty 1
  Filled 2017-02-27: qty 2
  Filled 2017-02-27 (×2): qty 1

## 2017-02-27 MED ORDER — APIXABAN 5 MG PO TABS
5.0000 mg | ORAL_TABLET | Freq: Two times a day (BID) | ORAL | Status: DC
  Administered 2017-02-27 – 2017-03-01 (×4): 5 mg via ORAL
  Filled 2017-02-27 (×4): qty 1

## 2017-02-27 MED ORDER — LORAZEPAM 2 MG/ML IJ SOLN: 1.0000 mg | INTRAMUSCULAR | Status: DC | PRN

## 2017-02-27 MED ORDER — LORAZEPAM 2 MG/ML IJ SOLN
1.0000 mg | INTRAMUSCULAR | Status: DC | PRN
  Administered 2017-02-27 (×2): 1 mg via INTRAVENOUS
  Filled 2017-02-27 (×2): qty 1

## 2017-02-27 MED ORDER — DEXMEDETOMIDINE HCL IN NACL 400 MCG/100ML IV SOLN
0.4000 ug/kg/h | INTRAVENOUS | Status: DC
  Administered 2017-02-27 – 2017-02-28 (×2): 0.4 ug/kg/h via INTRAVENOUS
  Administered 2017-02-28: 0.6 ug/kg/h via INTRAVENOUS
  Filled 2017-02-27 (×3): qty 100

## 2017-02-27 MED ORDER — AMIODARONE HCL 200 MG PO TABS
200.0000 mg | ORAL_TABLET | Freq: Every day | ORAL | Status: DC
  Administered 2017-02-27 – 2017-03-01 (×3): 200 mg via ORAL
  Filled 2017-02-27 (×3): qty 1

## 2017-02-27 MED ORDER — CARVEDILOL 3.125 MG PO TABS: 3.1250 mg | ORAL_TABLET | Freq: Two times a day (BID) | ORAL | Status: DC

## 2017-02-27 MED ORDER — ENALAPRIL MALEATE 2.5 MG PO TABS: 2.5000 mg | ORAL_TABLET | Freq: Every day | ORAL | Status: DC

## 2017-02-27 MED ORDER — LORAZEPAM 2 MG/ML IJ SOLN
1.0000 mg | INTRAMUSCULAR | Status: DC | PRN
  Administered 2017-02-27 – 2017-03-01 (×5): 1 mg via INTRAVENOUS
  Filled 2017-02-27 (×6): qty 1

## 2017-02-27 MED ORDER — OXYCODONE HCL 5 MG PO TABS
5.0000 mg | ORAL_TABLET | Freq: Three times a day (TID) | ORAL | Status: DC
  Administered 2017-02-27 – 2017-03-01 (×6): 5 mg via ORAL
  Filled 2017-02-27 (×6): qty 1

## 2017-02-27 NOTE — Progress Notes (Signed)
Spoke with Seward GraterMaggie, NP concerning patient's ativan interval in association with his alcohol withdrawal.  Pt has visual tremors with significant anxiety and agitation.  No new orders.

## 2017-02-27 NOTE — Progress Notes (Signed)
ANTICOAGULATION CONSULT NOTE - Follow Up Consult  Pharmacy Consult for Heparin  Indication: atrial fibrillation  Allergies  Allergen Reactions  . Other Other (See Comments)    Patient states he can't take any "mycin" medications because myasthenia gravis.    Patient Measurements: Height: 5\' 10"  (177.8 cm) Weight: 182 lb 1.6 oz (82.6 kg) IBW/kg (Calculated) : 73 Heparin Dosing Weight:   Vital Signs: Temp: 98.4 F (36.9 C) (10/23 0200) Temp Source: Axillary (10/23 0200) BP: 82/70 (10/23 0400) Pulse Rate: 77 (10/23 0400)  Labs:  Recent Labs  02/24/17 2244 02/25/17 1444 02/25/17 1837 02/25/17 2115 02/26/17 0406 02/27/17 0313  HGB 10.3* 10.1*  --   --  9.1*  --   HCT 32.5* 33.1*  --   --  29.9*  --   PLT 286 237  --   --  218  --   APTT  --  26  --   --   --   --   LABPROT  --  11.8  --   --   --   --   INR  --  0.87  --   --   --   --   HEPARINUNFRC  --   --   --  0.65 0.53 0.52  CREATININE 0.78  --   --  0.77 0.78 0.86  TROPONINI  --  0.03* 0.03* <0.03  --   --     Estimated Creatinine Clearance: 99 mL/min (by C-G formula based on SCr of 0.86 mg/dL).   Medications:  Prescriptions Prior to Admission  Medication Sig Dispense Refill Last Dose  . citalopram (CELEXA) 20 MG tablet Take 1 tablet (20 mg total) by mouth daily. 30 tablet 5 Past Week at Unknown time  . furosemide (LASIX) 40 MG tablet Take 40 mg by mouth daily as needed for fluid (if taking prednisone).    Past Week at Unknown time  . gabapentin (NEURONTIN) 300 MG capsule Take 2 capsules (600 mg total) by mouth 3 (three) times daily. 180 capsule 5 Past Week at Unknown time  . lisinopril (PRINIVIL,ZESTRIL) 10 MG tablet Take 1 tablet (10 mg total) by mouth daily. 30 tablet 0 Past Week at Unknown time  . metFORMIN (GLUCOPHAGE) 500 MG tablet Take 1 tablet (500 mg total) by mouth daily. 30 tablet 0 Past Week at Unknown time  . mycophenolate (CELLCEPT) 500 MG tablet Take one pill in the morning and two pills at  night. 90 tablet 5 Past Week at Unknown time  . ALPRAZolam (XANAX) 0.5 MG tablet Take 1 or 2 tablets 30 min. prior to MRI.  You must not drink alcohol, drive, or operate dangerous equipment after taking this medication. (Patient not taking: Reported on 02/25/2017) 2 tablet 0 Not Taking at Unknown time  . clonazePAM (KLONOPIN) 0.5 MG tablet Take 1 tablet (0.5 mg total) by mouth 2 (two) times daily as needed for anxiety. (Patient not taking: Reported on 02/25/2017) 30 tablet 0 Not Taking at Unknown time  . diltiazem (CARDIZEM CD) 120 MG 24 hr capsule Take 1 capsule (120 mg total) by mouth daily. (Patient not taking: Reported on 02/25/2017) 30 capsule 0 Not Taking at Unknown time  . fentaNYL (DURAGESIC - DOSED MCG/HR) 50 MCG/HR Place 1 patch (50 mcg total) onto the skin every 3 (three) days. (Patient not taking: Reported on 08/24/2016) 10 patch 0 02/23/2017  . lidocaine-prilocaine (EMLA) cream Apply 1 application topically as needed. Apply to feet twice daily.   prn at prn  .  metoprolol tartrate (LOPRESSOR) 25 MG tablet Take 1 tablet (25 mg total) by mouth 2 (two) times daily. (Patient not taking: Reported on 02/25/2017) 60 tablet 0 Not Taking at Unknown time  . oxyCODONE (OXY IR/ROXICODONE) 5 MG immediate release tablet Take 1 tablet (5 mg total) by mouth every 8 (eight) hours as needed. 90 tablet 0 prn at prn  . predniSONE (DELTASONE) 10 MG tablet Take 6 po x 2d, then 5 po x 2d, then 4 po x 2d, then 3 po x 2d, then 2 po x 2d, then 1 po x 2d 42 tablet 0 prn at prn  . SUMAtriptan (IMITREX) 100 MG tablet Take 1 tablet (100 mg total) by mouth every 2 (two) hours as needed for migraine. May repeat in 2 hours if headache persists or recurs. (Patient not taking: Reported on 08/24/2016) 6 tablet 0 prn at prn  . zolpidem (AMBIEN) 10 MG tablet Take 1 tablet (10 mg total) by mouth at bedtime as needed for sleep. (Patient not taking: Reported on 02/25/2017) 15 tablet 0 Not Taking at Unknown time    Assessment: CrCl =  82.6 ml/min  Goal of Therapy:  Heparin level 0.3-0.7 units/ml Monitor platelets by anticoagulation protocol: Yes   Plan:  10/22 :  HL @ 400 = 0.53 Will continue this pt on current rate and recheck HL on 10/23 with AM labs.  10/23 @ 0300 HL 0.52 therapeutic. Will continue current rate and will recheck next level w/ am labs.  Thomasene Rippleavid Mekisha Bittel, PharmD, BCPS Clinical Pharmacist 02/27/2017

## 2017-02-27 NOTE — Progress Notes (Signed)
Cognition improved No distress Complains of back pain Well oriented Back in sinus rhythm  Vitals:   02/27/17 1000 02/27/17 1100 02/27/17 1200 02/27/17 1400  BP: 129/72 116/73 (!) 144/84 131/71  Pulse:      Resp: (!) 21  16   Temp:      TempSrc:      SpO2:      Weight:      Height:      RA   Gen: NAD HEENT: NCAT, sclerae white Neck: No LAN, no JVD noted Lungs: full BS, no adventitious sounds Cardiovascular: Reg rate, no M noted Abdomen: Soft, NT, +BS Ext: no C/C/E Neuro: PERRL, EOMI, motor/sensory grossly intact  BMP Latest Ref Rng & Units 02/27/2017 02/26/2017 02/25/2017  Glucose 65 - 99 mg/dL 161(W102(H) 960(A152(H) 540(J141(H)  BUN 6 - 20 mg/dL 11 12 12   Creatinine 0.61 - 1.24 mg/dL 8.110.86 9.140.78 7.820.77  Sodium 135 - 145 mmol/L 134(L) 136 137  Potassium 3.5 - 5.1 mmol/L 3.9 3.3(L) 3.1(L)  Chloride 101 - 111 mmol/L 101 97(L) 96(L)  CO2 22 - 32 mmol/L 27 27 26   Calcium 8.9 - 10.3 mg/dL 9.0 8.9 9.5(A8.8(L)    CBC Latest Ref Rng & Units 02/26/2017 02/25/2017 02/24/2017  WBC 3.8 - 10.6 K/uL 5.1 5.6 5.3  Hemoglobin 13.0 - 18.0 g/dL 2.1(H9.1(L) 10.1(L) 10.3(L)  Hematocrit 40.0 - 52.0 % 29.9(L) 33.1(L) 32.5(L)  Platelets 150 - 440 K/uL 218 237 286    CXR: NNF  IMPRESSION: Alcohol withdrawal syndrome - much improved Chronic pain New onset atrial fibrillation - has converted back to sinus rhythm  PLAN/REC: Transfer to MedSurg with cardiac monitoring Continue anticoagulation - can probably be transitioned to oral anticoagulation When necessary lorazepam When necessary oxycodone - resumed his outpatient regimen After transfer, PCCM will sign off. Please call if we can be of further assistance    Billy Fischeravid Kaleya Douse, MD PCCM service Mobile [phone] Pager [phone] 02/27/2017 2:16 PM

## 2017-02-27 NOTE — Progress Notes (Signed)
At 1810 hours, this RN called Dr. Sung AmabileSimonds and updated him on this patient's condition. Patient continues to try to get out of bed, removes monitoring equipment, removes gown, states that he "hears" his mother talking, insists on putting his own personal clothing on so he "can go". He has visible tremors, makes statements that do not correlate with any present relevant situation and/or conversation, is noted to be scratching, swats his hands in the air both with eyes open and closed. Pulled out one of his PIV's earlier today when getting out of bed. Dr. Sung AmabileSimonds gave verbal orders with read back to cancel transfer to floor, change patient back to stepdown, d/c current Lorazepam orders, change to Lorazepam 1 mg Q 2 hours PRN, and restart Precedex gtt with standard order parameters. Orders were carried out. Patient's sister and brother-in-law at the bedside attempting to calm patient at the time of this note entry.

## 2017-02-27 NOTE — Progress Notes (Signed)
Pt provided with brochure from [facility] upon request.

## 2017-02-27 NOTE — Consult Note (Signed)
[facility] Consult   Reason for Consult: Consult for a 56 year old man with alcohol abuse currently in the intensive care unit being managed for delirium tremens Referring Physician:  Tressia Miners Patient Identification: Kurt Horton MRN:  [id number] Principal Diagnosis: Alcohol withdrawal delirium [facility]) Diagnosis:   Patient Active Problem List   Diagnosis Date Noted  . Alcohol withdrawal delirium ([place]) [F10.231] 02/26/2017  . Atrial fibrillation with RVR ([place]) [I48.91] 02/25/2017  . Acute alcoholic intoxication with complication ([place]) [D22.025]   . Alcohol abuse [F10.10]   . Alcohol withdrawal ([place]) [F10.239] 11/13/2016  . Substance induced mood disorder ([place]) [F19.94] 11/13/2016  . SVT (supraventricular tachycardia) ([place]) [I47.1] 11/12/2016  . HTN (hypertension) [I10] 11/12/2016  . EtOH dependence ([place]) [F10.20] 11/12/2016  . Lumbar radiculopathy [M54.16] 08/24/2016  . Depression with anxiety [F41.8] 08/24/2016  . Chronic low back pain [M54.5, G89.29] 01/04/2016  . Pressure ulcer [L89.90] 12/29/2015  . Osteomyelitis ([place]) [M86.9] 12/26/2015  . PVD (peripheral vascular disease) ([place]) [I73.9] 12/26/2015  . Type II diabetes mellitus with manifestations ([place]) [E11.8] 12/26/2015  . Myasthenia gravis ([place]) [G70.00] 12/26/2015    Total Time spent with patient: 20 minutes  Subjective:   Kurt Horton is a 56 y.o. male patient admitted with patient not able to give any information.  This 56 year old man presented to the emergency room with a blood alcohol level over 400.  Has been having complicated alcohol withdrawal and is being managed in the intensive care unit.Marland Kitchen\\  Follow up for Wednesday the 23rd.  Patient seen.  Chart reviewed.  Spoke with nursing.  Patient is still in the intensive care unit.  He was taken off of Precedex this morning and was reported to have been doing reasonably well until about an hour ago at which time he became agitated again.  By nursing's  report he became very confused was calling out for his mother and trying to pull out lines and to get out of bed.  He has been now placed back on the Precedex drip.  When I came to see him the patient was calm but was still insisting that he was ready to leave and go out to get something to eat.  His attention and orientation waxed and waned.  He is still showing signs of delirium and confusion although he is calmer now.  He needs to stay I think on the Precedex drip overnight and tomorrow can again try coming off of it.  Certainly not in any condition for leaving the hospital yet.  HPI: Patient was seen over the weekend by psychiatrist on call.  He is in the intensive care being managed for agitation and delirium.  On evaluation today the patient is still on a Precedex drip.  Nursing reports that he was responsive when they were working with him earlier but he did not respond to verbal cues from me.  Currently blood pressure little high but vital signs stable not actively tremulous.  Patient not able to answer any questions  Past Psychiatric History: Patient with a history of alcohol abuse.  Has been seen previously in the emergency room.  Unclear if he is ever had DTs in the past.  History of mild depression but no hospitalization no history of suicide attempts  Risk to Self: Suicidal Ideation: No Suicidal Intent: No Is patient at risk for suicide?: No Suicidal Plan?: No Access to Means: No What has been your use of drugs/alcohol within the last 12 months?: Alcohol How many times?: 0 Other Self  Harm Risks: Active Addiction Triggers for Past Attempts: None known Intentional Self Injurious Behavior: None Risk to Others: Homicidal Ideation: No Thoughts of Harm to Others: No Current Homicidal Intent: No Current Homicidal Plan: No Access to Homicidal Means: No Identified Victim: Reports of none History of harm to others?: No Assessment of Violence: None Noted Violent Behavior Description:  Reports of none Does patient have access to weapons?: No Criminal Charges Pending?: No Does patient have a court date: No Prior Inpatient Therapy: Prior Inpatient Therapy: Yes Prior Therapy Dates: 02/2017 Prior Therapy Facilty/Provider(s): [facility]  Reason for Treatment: Alcohol Use Use Prior Outpatient Therapy: Prior Outpatient Therapy: Yes Prior Therapy Dates: Current Prior Therapy Facilty/Provider(s): [facility] Reason for Treatment: SAIOP Does patient have an ACCT team?: No Does patient have Intensive In-House Services?  : No Does patient have [facility] services? : No Does patient have P4CC services?: No  Past Medical History:  Past Medical History:  Diagnosis Date  . Alcohol abuse   . Alcohol withdrawal ([place]) 11/12/2016  . Diabetes mellitus without complication ([place])   . DJD (degenerative joint disease)   . Hypertension   . Myasthenia gravis ([place])   . Renal disorder     Past Surgical History:  Procedure Laterality Date  . AMPUTATION TOE Left 12/29/2015   Procedure: AMPUTATION TOE;  Surgeon: Samara Deist, DPM;  Location: [facility] ORS;  Service: Podiatry;  Laterality: Left;  . PERIPHERAL VASCULAR CATHETERIZATION N/A 12/27/2015   Procedure: Lower Extremity Angiography;  Surgeon: Algernon Huxley, MD;  Location: [facility];  Service: Cardiovascular;  Laterality: N/A;   Family History:  Family History  Problem Relation Age of Onset  . Rheum arthritis Father    Family Psychiatric  History: Positive for alcohol abuse Social History:  History  Alcohol Use  . Yes    Comment: Pt states that last drink was today     History  Drug use: Unknown    Social History   Social History  . Marital status: Legally Separated    Spouse name: N/A  . Number of children: N/A  . Years of education: N/A   Social History Main Topics  . Smoking status: Never Smoker  . Smokeless tobacco: Never Used  . Alcohol use Yes     Comment: Pt states that last drink was today   . Drug use: Unknown  . Sexual activity: Not Asked   Other Topics Concern  . None   Social History Narrative  . None   Additional Social History:    Allergies:   Allergies  Allergen Reactions  . Other Other (See Comments)    Patient states he can't take any "mycin" medications because myasthenia gravis.    Labs:  Results for orders placed or performed during the hospital encounter of 02/24/17 (from the past 48 hour(s))  Troponin I     Status: None   Collection Time: 02/25/17  9:15 PM  Result Value Ref Range   Troponin I <0.03 <0.03 ng/mL  Heparin level (unfractionated)     Status: None   Collection Time: 02/25/17  9:15 PM  Result Value Ref Range   Heparin Unfractionated 0.65 0.30 - 0.70 IU/mL    Comment:        IF HEPARIN RESULTS ARE BELOW EXPECTED VALUES, AND PATIENT DOSAGE HAS BEEN CONFIRMED, SUGGEST FOLLOW UP TESTING OF ANTITHROMBIN III LEVELS.   Comprehensive metabolic panel     Status: Abnormal   Collection Time: 02/25/17  9:15 PM  Result Value Ref  Range   Sodium 137 135 - 145 mmol/L   Potassium 3.1 (L) 3.5 - 5.1 mmol/L   Chloride 96 (L) 101 - 111 mmol/L   CO2 26 22 - 32 mmol/L   Glucose, Bld 141 (H) 65 - 99 mg/dL   BUN 12 6 - 20 mg/dL   Creatinine, Ser 0.77 0.61 - 1.24 mg/dL   Calcium 8.8 (L) 8.9 - 10.3 mg/dL   Total Protein 6.7 6.5 - 8.1 g/dL   Albumin 3.9 3.5 - 5.0 g/dL   AST 42 (H) 15 - 41 U/L   ALT 26 17 - 63 U/L   Alkaline Phosphatase 70 38 - 126 U/L   Total Bilirubin 1.0 0.3 - 1.2 mg/dL   GFR calc non Af Amer >60 >60 mL/min   GFR calc Af Amer >60 >60 mL/min    Comment: (NOTE) The eGFR has been calculated using the CKD EPI equation. This calculation has not been validated in all clinical situations. eGFR's persistently <60 mL/min signify possible Chronic Kidney Disease.    Anion gap 15 5 - 15  Magnesium     Status: Abnormal   Collection Time: 02/25/17  9:15 PM  Result Value Ref Range   Magnesium 1.4 (L) 1.7 - 2.4 mg/dL  Phosphorus      Status: None   Collection Time: 02/25/17  9:15 PM  Result Value Ref Range   Phosphorus 3.6 2.5 - 4.6 mg/dL  Glucose, capillary     Status: Abnormal   Collection Time: 02/25/17 10:12 PM  Result Value Ref Range   Glucose-Capillary 148 (H) 65 - 99 mg/dL   Comment 1 Notify RN    Comment 2 Document in Chart   Basic metabolic panel     Status: Abnormal   Collection Time: 02/26/17  4:06 AM  Result Value Ref Range   Sodium 136 135 - 145 mmol/L   Potassium 3.3 (L) 3.5 - 5.1 mmol/L   Chloride 97 (L) 101 - 111 mmol/L   CO2 27 22 - 32 mmol/L   Glucose, Bld 152 (H) 65 - 99 mg/dL   BUN 12 6 - 20 mg/dL   Creatinine, Ser 0.78 0.61 - 1.24 mg/dL   Calcium 8.9 8.9 - 10.3 mg/dL   GFR calc non Af Amer >60 >60 mL/min   GFR calc Af Amer >60 >60 mL/min    Comment: (NOTE) The eGFR has been calculated using the CKD EPI equation. This calculation has not been validated in all clinical situations. eGFR's persistently <60 mL/min signify possible Chronic Kidney Disease.    Anion gap 12 5 - 15  CBC     Status: Abnormal   Collection Time: 02/26/17  4:06 AM  Result Value Ref Range   WBC 5.1 3.8 - 10.6 K/uL   RBC 4.02 (L) 4.40 - 5.90 MIL/uL   Hemoglobin 9.1 (L) 13.0 - 18.0 g/dL   HCT 29.9 (L) 40.0 - 52.0 %   MCV 74.5 (L) 80.0 - 100.0 fL   MCH 22.6 (L) 26.0 - 34.0 pg   MCHC 30.4 (L) 32.0 - 36.0 g/dL   RDW 21.0 (H) 11.5 - 14.5 %   Platelets 218 150 - 440 K/uL  Heparin level (unfractionated)     Status: None   Collection Time: 02/26/17  4:06 AM  Result Value Ref Range   Heparin Unfractionated 0.53 0.30 - 0.70 IU/mL    Comment:        IF HEPARIN RESULTS ARE BELOW EXPECTED VALUES, AND PATIENT DOSAGE HAS BEEN CONFIRMED,  SUGGEST FOLLOW UP TESTING OF ANTITHROMBIN III LEVELS.   Magnesium     Status: Abnormal   Collection Time: 02/26/17  4:06 AM  Result Value Ref Range   Magnesium 1.4 (L) 1.7 - 2.4 mg/dL  Phosphorus     Status: None   Collection Time: 02/26/17  4:06 AM  Result Value Ref Range    Phosphorus 3.6 2.5 - 4.6 mg/dL  Glucose, capillary     Status: Abnormal   Collection Time: 02/26/17  7:17 AM  Result Value Ref Range   Glucose-Capillary 156 (H) 65 - 99 mg/dL  Glucose, capillary     Status: Abnormal   Collection Time: 02/26/17 10:59 AM  Result Value Ref Range   Glucose-Capillary 113 (H) 65 - 99 mg/dL  Glucose, capillary     Status: Abnormal   Collection Time: 02/26/17  3:40 PM  Result Value Ref Range   Glucose-Capillary 125 (H) 65 - 99 mg/dL  Glucose, capillary     Status: Abnormal   Collection Time: 02/26/17  6:20 PM  Result Value Ref Range   Glucose-Capillary 124 (H) 65 - 99 mg/dL   Comment 1 Document in Chart   Glucose, capillary     Status: Abnormal   Collection Time: 02/26/17  9:20 PM  Result Value Ref Range   Glucose-Capillary 170 (H) 65 - 99 mg/dL  Heparin level (unfractionated)     Status: None   Collection Time: 02/27/17  3:13 AM  Result Value Ref Range   Heparin Unfractionated 0.52 0.30 - 0.70 IU/mL    Comment:        IF HEPARIN RESULTS ARE BELOW EXPECTED VALUES, AND PATIENT DOSAGE HAS BEEN CONFIRMED, SUGGEST FOLLOW UP TESTING OF ANTITHROMBIN III LEVELS.   Basic metabolic panel     Status: Abnormal   Collection Time: 02/27/17  3:13 AM  Result Value Ref Range   Sodium 134 (L) 135 - 145 mmol/L   Potassium 3.9 3.5 - 5.1 mmol/L   Chloride 101 101 - 111 mmol/L   CO2 27 22 - 32 mmol/L   Glucose, Bld 102 (H) 65 - 99 mg/dL   BUN 11 6 - 20 mg/dL   Creatinine, Ser 0.86 0.61 - 1.24 mg/dL   Calcium 9.0 8.9 - 10.3 mg/dL   GFR calc non Af Amer >60 >60 mL/min   GFR calc Af Amer >60 >60 mL/min    Comment: (NOTE) The eGFR has been calculated using the CKD EPI equation. This calculation has not been validated in all clinical situations. eGFR's persistently <60 mL/min signify possible Chronic Kidney Disease.    Anion gap 6 5 - 15  Magnesium     Status: None   Collection Time: 02/27/17  3:13 AM  Result Value Ref Range   Magnesium 2.2 1.7 - 2.4 mg/dL   Phosphorus     Status: None   Collection Time: 02/27/17  3:13 AM  Result Value Ref Range   Phosphorus 4.2 2.5 - 4.6 mg/dL  Glucose, capillary     Status: Abnormal   Collection Time: 02/27/17  7:23 AM  Result Value Ref Range   Glucose-Capillary 102 (H) 65 - 99 mg/dL  Glucose, capillary     Status: Abnormal   Collection Time: 02/27/17 11:16 AM  Result Value Ref Range   Glucose-Capillary 103 (H) 65 - 99 mg/dL  Glucose, capillary     Status: Abnormal   Collection Time: 02/27/17  4:55 PM  Result Value Ref Range   Glucose-Capillary 127 (H) 65 - 99 mg/dL  Current Facility-Administered Medications  Medication Dose Route Frequency Provider Last Rate Last Dose  . acetaminophen (TYLENOL) tablet 650 mg  650 mg Oral Q6H PRN Henreitta Leber, MD      . amiodarone (PACERONE) tablet 200 mg  200 mg Oral Daily Neoma Laming A, MD   200 mg at 02/27/17 1013  . apixaban (ELIQUIS) tablet 5 mg  5 mg Oral BID Gladstone Lighter, MD   5 mg at 02/27/17 1731  . carvedilol (COREG) tablet 6.25 mg  6.25 mg Oral BID WC Wilhelmina Mcardle, MD   6.25 mg at 02/27/17 1730  . citalopram (CELEXA) tablet 20 mg  20 mg Oral Daily Henreitta Leber, MD   20 mg at 02/27/17 1013  . dexmedetomidine (PRECEDEX) 400 MCG/100ML (4 mcg/mL) infusion  0.4-1.2 mcg/kg/hr Intravenous Titrated Wilhelmina Mcardle, MD 8.3 mL/hr at 02/27/17 1830 0.4 mcg/kg/hr at 02/27/17 1830  . folic acid (FOLVITE) tablet 1 mg  1 mg Oral Daily Hinda Kehr, MD   1 mg at 02/27/17 1013  . furosemide (LASIX) tablet 40 mg  40 mg Oral Daily PRN Henreitta Leber, MD      . gabapentin (NEURONTIN) capsule 600 mg  600 mg Oral TID Henreitta Leber, MD   600 mg at 02/27/17 1730  . lisinopril (PRINIVIL,ZESTRIL) tablet 10 mg  10 mg Oral Daily Henreitta Leber, MD   10 mg at 02/27/17 1013  . ondansetron (ZOFRAN) injection 4 mg  4 mg Intravenous Q6H PRN Henreitta Leber, MD      . oxyCODONE (Oxy IR/ROXICODONE) immediate release tablet 5 mg  5 mg Oral TID Wilhelmina Mcardle,  MD   5 mg at 02/27/17 1730  . oxyCODONE-acetaminophen (PERCOCET/ROXICET) 5-325 MG per tablet 1-2 tablet  1-2 tablet Oral Q6H PRN Wilhelmina Mcardle, MD   2 tablet at 02/27/17 1429  . pneumococcal 23 valent vaccine (PNU-IMMUNE) injection 0.5 mL  0.5 mL Intramuscular Tomorrow-1000 Wilhelmina Mcardle, MD   Stopped at 02/26/17 1000  . thiamine (VITAMIN B-1) tablet 100 mg  100 mg Oral Daily Hinda Kehr, MD   100 mg at 02/27/17 1013  . vitamin B-12 (CYANOCOBALAMIN) tablet 1,000 mcg  1,000 mcg Oral Daily Dorene Sorrow S, NP   1,000 mcg at 02/27/17 1013    Musculoskeletal: Strength & Muscle Tone: within normal limits Gait & Station: unable to stand Patient leans: N/A  Psychiatric Specialty Exam: Physical Exam  Nursing note and vitals reviewed. Constitutional: He appears well-developed and well-nourished.  HENT:  Head: Normocephalic and atraumatic.  Eyes: Pupils are equal, round, and reactive to light. Conjunctivae are normal.  Neck: Normal range of motion.  Cardiovascular: Regular rhythm and normal heart sounds.   Respiratory: Effort normal. No respiratory distress.  GI: Soft.  Musculoskeletal: Normal range of motion.  Neurological: He is alert.  Skin: Skin is warm and dry.  Psychiatric: His affect is blunt. He is noncommunicative.    Review of Systems  Unable to perform ROS: Medical condition    Blood pressure (!) 141/85, pulse 77, temperature 98.2 F (36.8 C), temperature source Oral, resp. rate (!) 9, height _0  (1.778 m), weight 182 lb 1.6 oz (82.6 kg), SpO2 92 %.Body mass index is 26.13 kg/m.  General Appearance: Casual  Eye Contact:  None  Speech:  Negative  Volume:  Decreased  Mood:  Negative  Affect:  Negative  Thought Process:  NA  Orientation:  Negative  Thought Content:  Negative  Suicidal Thoughts:  No  Homicidal Thoughts:  No  Memory:  Immediate;   Negative Recent;   Negative  Judgement:  Negative  Insight:  Negative  Psychomotor Activity:  Negative   Concentration:  Concentration: Negative  Recall:  Negative  Fund of Knowledge:  Negative  Language:  Negative  Akathisia:  Negative  Handed:  Right  AIMS (if indicated):     Assets:  Resilience  ADL's:  Impaired  Cognition:  Impaired,  Moderate  Sleep:        Treatment Plan Summary: Medication management and Plan Doing better back on the Precedex and probably will overnight as he is sundowning.  Tomorrow can probably come off it again and be reassessed.  No change to medication or protocol otherwise.  Disposition: No evidence of imminent risk to self or others at present.   Patient does not meet criteria for psychiatric inpatient admission.  Alethia Berthold, MD 02/27/2017 7:03 PM

## 2017-02-27 NOTE — Progress Notes (Signed)
[facility] - [place] at [facility]   PATIENT NAME: Nilda SimmerJohn He    MR#:  [id number]  DATE OF BIRTH:  1960/11/04  SUBJECTIVE:  CHIEF COMPLAINT:   Chief Complaint  Patient presents with  . Alcohol Intoxication  . Psychiatric Evaluation   -Being treated for alcohol withdrawal symptoms at this time. Still has significant tremor of both extremities. Some confusion noted. -Heart rate is better controlled. Off Precedex drip this morning  REVIEW OF SYSTEMS:  Review of Systems  Constitutional: Positive for malaise/fatigue. Negative for chills and fever.  HENT: Negative for congestion, ear discharge, hearing loss and nosebleeds.   Eyes: Negative for blurred vision and double vision.  Respiratory: Negative for cough, shortness of breath and wheezing.   Cardiovascular: Negative for chest pain, palpitations and leg swelling.  Gastrointestinal: Negative for abdominal pain, constipation, diarrhea, nausea and vomiting.  Genitourinary: Negative for dysuria.  Musculoskeletal: Positive for back pain. Negative for myalgias.  Neurological: Positive for tremors. Negative for dizziness, speech change, focal weakness, seizures and headaches.  Psychiatric/Behavioral: Negative for depression. The patient is nervous/anxious.     DRUG ALLERGIES:   Allergies  Allergen Reactions  . Other Other (See Comments)    Patient states he can't take any "mycin" medications because myasthenia gravis.    VITALS:  Blood pressure (!) 144/84, pulse 77, temperature 98 F (36.7 C), temperature source Oral, resp. rate 16, height 5\' 10"  (1.778 m), weight 82.6 kg (182 lb 1.6 oz), SpO2 92 %.  PHYSICAL EXAMINATION:  Physical Exam  GENERAL:  56 y.o.-year-old patient lying in the bed with no acute distress.  EYES: Pupils equal, round, reactive to light and accommodation. No scleral icterus. Extraocular muscles intact.  HEENT: Head atraumatic, normocephalic. Oropharynx and nasopharynx clear.  NECK:   Supple, no jugular venous distention. No thyroid enlargement, no tenderness.  LUNGS: Normal breath sounds bilaterally, no wheezing, rales,rhonchi or crepitation. No use of accessory muscles of respiration.  CARDIOVASCULAR: S1, S2 normal. No murmurs, rubs, or gallops.  ABDOMEN: Soft, nontender, nondistended. Bowel sounds present. No organomegaly or mass.  EXTREMITIES: No pedal edema, cyanosis, or clubbing. Significant tremors of upper extremities noted NEUROLOGIC: Cranial nerves II through XII are intact. Muscle strength 5/5 in all extremities. Following commands.Sensation intact. Gait not checked.  PSYCHIATRIC: The patient is alert and oriented 3 SKIN: No obvious rash, lesion, or ulcer.    LABORATORY PANEL:   CBC  Recent Labs Lab 02/26/17 0406  WBC 5.1  HGB 9.1*  HCT 29.9*  PLT 218   ------------------------------------------------------------------------------------------------------------------  Chemistries   Recent Labs Lab 02/25/17 2115  02/27/17 0313  NA 137  < > 134*  K 3.1*  < > 3.9  CL 96*  < > 101  CO2 26  < > 27  GLUCOSE 141*  < > 102*  BUN 12  < > 11  CREATININE 0.77  < > 0.86  CALCIUM 8.8*  < > 9.0  MG 1.4*  < > 2.2  AST 42*  --   --   ALT 26  --   --   ALKPHOS 70  --   --   BILITOT 1.0  --   --   < > = values in this interval not displayed. ------------------------------------------------------------------------------------------------------------------  Cardiac Enzymes  Recent Labs Lab 02/25/17 2115  TROPONINI <0.03   ------------------------------------------------------------------------------------------------------------------  RADIOLOGY:  No results found.  EKG:   Orders placed or performed during the hospital encounter of 02/24/17  . EKG 12-Lead  . EKG 12-Lead  .  EKG 12-Lead  . EKG 12-Lead  . ED EKG  . ED EKG  . ED EKG  . ED EKG    ASSESSMENT AND PLAN:   56 year old male with past medical history significant for alcohol  abuse, depression and anxiety, hypertension degenerative disc disease presents to hospital secondary to alcohol intoxication. Noted to be in A. Fib with RVR  #1 atrial fibrillation with rapid ventricular response- - Off the Cardizem drip this morning. Remains on heparin drip -Changed to oral eliquis. Not sure if he'll be a good candidate for long-term anticoagulation due to history of alcohol abuse and risk for falls. -Appreciate cardiology consult. Echocardiogram is pending  - Mild elevation in troponin is secondary to demand ischemia  #2 alcohol intoxication- supposed to start inpatient rehabilitation with [facility], we'll get social worker involved. -Continue CIWA protocol, off Precedex drip at this time - on ativan prn -continue thiamine, folic acid and B12 supplementation.  #3 depression with suicidal ideation-was committed involuntarily in the emergency room. -Patient denies being suicidal. Does have a lot of stressors. Psych consult is pending  #4 hypokalemia-replaced  #5 chronic low back pain-continue pain medications as needed  #6-DVT prophylaxis- to be started on eliquis today   All the records are reviewed and case discussed with Care Management/Social Workerr. Management plans discussed with the patient, family and they are in agreement.  CODE STATUS: full code  TOTAL TIME TAKING CARE OF THIS PATIENT: 38 minutes.   POSSIBLE D/C IN 2-3 DAYS, DEPENDING ON CLINICAL CONDITION.   Whitnee Orzel M.D on 02/27/2017 at 1:16 PM  Between 7am to 6pm - Pager - [phone]  After 6pm go to [email or web address] - password [email or web address]  [facility]  Office  [phone]  CC: Primary care physician; Lamar Blinks, MD

## 2017-02-27 NOTE — Progress Notes (Signed)
SUBJECTIVE: Patient is feeling much better but still short of breath   Vitals:   02/27/17 0500 02/27/17 0600 02/27/17 0700 02/27/17 0800  BP: 123/75 113/78 103/80 107/65  Pulse:      Resp: 12 14 14 12   Temp:   98 F (36.7 C)   TempSrc:   Oral   SpO2:      Weight:      Height:        Intake/Output Summary (Last 24 hours) at 02/27/17 0855 Last data filed at 02/27/17 0800  Gross per 24 hour  Intake          1698.29 ml  Output             1750 ml  Net           -51.71 ml    LABS: Basic Metabolic Panel:  Recent Labs  16/02/9609/22/18 0406 02/27/17 0313  NA 136 134*  K 3.3* 3.9  CL 97* 101  CO2 27 27  GLUCOSE 152* 102*  BUN 12 11  CREATININE 0.78 0.86  CALCIUM 8.9 9.0  MG 1.4* 2.2  PHOS 3.6 4.2   Liver Function Tests:  Recent Labs  02/24/17 2244 02/25/17 2115  AST 49* 42*  ALT 28 26  ALKPHOS 77 70  BILITOT 0.6 1.0  PROT 7.0 6.7  ALBUMIN 4.0 3.9   No results for input(s): LIPASE, AMYLASE in the last 72 hours. CBC:  Recent Labs  02/25/17 1444 02/26/17 0406  WBC 5.6 5.1  HGB 10.1* 9.1*  HCT 33.1* 29.9*  MCV 74.0* 74.5*  PLT 237 218   Cardiac Enzymes:  Recent Labs  02/25/17 1444 02/25/17 1837 02/25/17 2115  TROPONINI 0.03* 0.03* <0.03   BNP: Invalid input(s): POCBNP D-Dimer: No results for input(s): DDIMER in the last 72 hours. Hemoglobin A1C: No results for input(s): HGBA1C in the last 72 hours. Fasting Lipid Panel: No results for input(s): CHOL, HDL, LDLCALC, TRIG, CHOLHDL, LDLDIRECT in the last 72 hours. Thyroid Function Tests: No results for input(s): TSH, T4TOTAL, T3FREE, THYROIDAB in the last 72 hours.  Invalid input(s): FREET3 Anemia Panel: No results for input(s): VITAMINB12, FOLATE, FERRITIN, TIBC, IRON, RETICCTPCT in the last 72 hours.   PHYSICAL EXAM General: Well developed, well nourished, in no acute distress HEENT:  Normocephalic and atramatic Neck:  No JVD.  Lungs: Clear bilaterally to auscultation and percussion. Heart:  HRRR . Normal S1 and S2 without gallops or murmurs.  Abdomen: Bowel sounds are positive, abdomen soft and non-tender  Msk:  Back normal, normal gait. Normal strength and tone for age. Extremities: No clubbing, cyanosis or edema.   Neuro: Alert and oriented X 3. Psych:  Good affect, responds appropriately  TELEMETRY: Sinus rhythm  ASSESSMENT AND PLAN: Sinus rhythm status post atrial fibrillation with rapid ventricular response rate and history of alcohol abuse. Patient has ejection fraction about 40% and needs to be on ace inhibitors as well as Coreg and can be transferred to telemetry. Discussed with the patient that he stop use of alcohol.   Principal Problem:   Alcohol withdrawal delirium (HCC) Active Problems:   Atrial fibrillation with RVR (HCC)   Acute alcoholic intoxication with complication (HCC)   Alcohol abuse    Kurt Horton A, MD, [facility] 02/27/2017 8:55 AM

## 2017-02-28 LAB — BASIC METABOLIC PANEL
ANION GAP: 10 (ref 5–15)
BUN: 12 mg/dL (ref 6–20)
CALCIUM: 9.1 mg/dL (ref 8.9–10.3)
CO2: 25 mmol/L (ref 22–32)
Chloride: 102 mmol/L (ref 101–111)
Creatinine, Ser: 0.85 mg/dL (ref 0.61–1.24)
Glucose, Bld: 130 mg/dL — ABNORMAL HIGH (ref 65–99)
POTASSIUM: 4.1 mmol/L (ref 3.5–5.1)
Sodium: 137 mmol/L (ref 135–145)

## 2017-02-28 LAB — CBC
HCT: 29.9 % — ABNORMAL LOW (ref 40.0–52.0)
Hemoglobin: 9.1 g/dL — ABNORMAL LOW (ref 13.0–18.0)
MCH: 22.8 pg — ABNORMAL LOW (ref 26.0–34.0)
MCHC: 30.3 g/dL — ABNORMAL LOW (ref 32.0–36.0)
MCV: 75.3 fL — ABNORMAL LOW (ref 80.0–100.0)
PLATELETS: 190 10*3/uL (ref 150–440)
RBC: 3.97 MIL/uL — AB (ref 4.40–5.90)
RDW: 21 % — AB (ref 11.5–14.5)
WBC: 4 10*3/uL (ref 3.8–10.6)

## 2017-02-28 LAB — GLUCOSE, CAPILLARY: GLUCOSE-CAPILLARY: 148 mg/dL — AB (ref 65–99)

## 2017-02-28 MED ORDER — NICOTINE POLACRILEX 2 MG MT GUM
2.0000 mg | CHEWING_GUM | OROMUCOSAL | Status: DC | PRN
  Administered 2017-02-28 – 2017-03-01 (×2): 2 mg via ORAL
  Filled 2017-02-28 (×2): qty 1

## 2017-02-28 NOTE — Consult Note (Signed)
[facility] Consult   Reason for Consult: Consult for a 56 year old man with alcohol abuse currently in the intensive care unit being managed for delirium tremens Referring Physician:  Tressia Miners Patient Identification: COLESTON DIROSA MRN:  [id number] Principal Diagnosis: Alcohol withdrawal delirium [facility]) Diagnosis:   Patient Active Problem List   Diagnosis Date Noted  . Alcohol withdrawal delirium ([place]) [F10.231] 02/26/2017  . Atrial fibrillation with RVR ([place]) [I48.91] 02/25/2017  . Acute alcoholic intoxication with complication ([place]) [F62.130]   . Alcohol abuse [F10.10]   . Alcohol withdrawal ([place]) [F10.239] 11/13/2016  . Substance induced mood disorder ([place]) [F19.94] 11/13/2016  . SVT (supraventricular tachycardia) ([place]) [I47.1] 11/12/2016  . HTN (hypertension) [I10] 11/12/2016  . EtOH dependence ([place]) [F10.20] 11/12/2016  . Lumbar radiculopathy [M54.16] 08/24/2016  . Depression with anxiety [F41.8] 08/24/2016  . Chronic low back pain [M54.5, G89.29] 01/04/2016  . Pressure ulcer [L89.90] 12/29/2015  . Osteomyelitis ([place]) [M86.9] 12/26/2015  . PVD (peripheral vascular disease) ([place]) [I73.9] 12/26/2015  . Type II diabetes mellitus with manifestations ([place]) [E11.8] 12/26/2015  . Myasthenia gravis ([place]) [G70.00] 12/26/2015    Total Time spent with patient: 20 minutes  Subjective:   MONROE QIN is a 56 y.o. male patient admitted with patient not able to give any information.  This 56 year old man presented to the emergency room with a blood alcohol level over 400.  Has been having complicated alcohol withdrawal and is being managed in the intensive care unit.Marland Kitchen\\  Follow up for Wednesday the 23rd.  Patient seen.  Chart reviewed.  Spoke with nursing.  Patient is still in the intensive care unit.  He was taken off of Precedex this morning and was reported to have been doing reasonably well until about an hour ago at which time he became agitated again.  By nursing's  report he became very confused was calling out for his mother and trying to pull out lines and to get out of bed.  He has been now placed back on the Precedex drip.  When I came to see him the patient was calm but was still insisting that he was ready to leave and go out to get something to eat.  His attention and orientation waxed and waned.  He is still showing signs of delirium and confusion although he is calmer now.  He needs to stay I think on the Precedex drip overnight and tomorrow can again try coming off of it.  Certainly not in any condition for leaving the hospital yet.  Follow-up for Wednesday the 24th.  Patient seen chart reviewed.  This evening he is off of his Precedex again and at last seems to be a little more lucid.  He was reported to still be somewhat confused earlier in the day but this evening he was able to have a fairly reasonable conversation.  He knows that he is in the hospital and has a basic idea about why he is here although he still has very limited insight.  He has a lot of unwillingness to admit that his alcohol abuse is the primary reason for his hospital stay.  He was able to stay on topic however and did not seem to be hallucinating.  He was not demanding to immediately go walk outside the hospital.  He was able to have a talk about alcohol as a problem and about longer term treatment.  He is a little bit on serious about it though still and I am skeptical about his commitment to  recovery.  HPI: Patient was seen over the weekend by psychiatrist on call.  He is in the intensive care being managed for agitation and delirium.  On evaluation today the patient is still on a Precedex drip.  Nursing reports that he was responsive when they were working with him earlier but he did not respond to verbal cues from me.  Currently blood pressure little high but vital signs stable not actively tremulous.  Patient not able to answer any questions  Past Psychiatric History: Patient with a  history of alcohol abuse.  Has been seen previously in the emergency room.  Unclear if he is ever had DTs in the past.  History of mild depression but no hospitalization no history of suicide attempts  Risk to Self: Suicidal Ideation: No Suicidal Intent: No Is patient at risk for suicide?: No Suicidal Plan?: No Access to Means: No What has been your use of drugs/alcohol within the last 12 months?: Alcohol How many times?: 0 Other Self Harm Risks: Active Addiction Triggers for Past Attempts: None known Intentional Self Injurious Behavior: None Risk to Others: Homicidal Ideation: No Thoughts of Harm to Others: No Current Homicidal Intent: No Current Homicidal Plan: No Access to Homicidal Means: No Identified Victim: Reports of none History of harm to others?: No Assessment of Violence: None Noted Violent Behavior Description: Reports of none Does patient have access to weapons?: No Criminal Charges Pending?: No Does patient have a court date: No Prior Inpatient Therapy: Prior Inpatient Therapy: Yes Prior Therapy Dates: 02/2017 Prior Therapy Facilty/Provider(s): [facility]  Reason for Treatment: Alcohol Use Use Prior Outpatient Therapy: Prior Outpatient Therapy: Yes Prior Therapy Dates: Current Prior Therapy Facilty/Provider(s): [facility] Reason for Treatment: SAIOP Does patient have an ACCT team?: No Does patient have Intensive In-House Services?  : No Does patient have [facility] services? : No Does patient have P4CC services?: No  Past Medical History:  Past Medical History:  Diagnosis Date  . Alcohol abuse   . Alcohol withdrawal ([place]) 11/12/2016  . Diabetes mellitus without complication ([place])   . DJD (degenerative joint disease)   . Hypertension   . Myasthenia gravis ([place])   . Renal disorder     Past Surgical History:  Procedure Laterality Date  . AMPUTATION TOE Left 12/29/2015   Procedure: AMPUTATION TOE;  Surgeon: Samara Deist, DPM;  Location: [facility]  ORS;  Service: Podiatry;  Laterality: Left;  . PERIPHERAL VASCULAR CATHETERIZATION N/A 12/27/2015   Procedure: Lower Extremity Angiography;  Surgeon: Algernon Huxley, MD;  Location: [facility] LAB;  Service: Cardiovascular;  Laterality: N/A;   Family History:  Family History  Problem Relation Age of Onset  . Rheum arthritis Father    Family Psychiatric  History: Positive for alcohol abuse Social History:  History  Alcohol Use  . Yes    Comment: Pt states that last drink was today     History  Drug use: Unknown    Social History   Social History  . Marital status: Legally Separated    Spouse name: N/A  . Number of children: N/A  . Years of education: N/A   Social History Main Topics  . Smoking status: Never Smoker  . Smokeless tobacco: Never Used  . Alcohol use Yes     Comment: Pt states that last drink was today  . Drug use: Unknown  . Sexual activity: Not Asked   Other Topics Concern  . None   Social History Narrative  . None   Additional  Social History:    Allergies:   Allergies  Allergen Reactions  . Other Other (See Comments)    Patient states he can't take any "mycin" medications because myasthenia gravis.    Labs:  Results for orders placed or performed during the hospital encounter of 02/24/17 (from the past 48 hour(s))  Glucose, capillary     Status: Abnormal   Collection Time: 02/26/17  9:20 PM  Result Value Ref Range   Glucose-Capillary 170 (H) 65 - 99 mg/dL  Heparin level (unfractionated)     Status: None   Collection Time: 02/27/17  3:13 AM  Result Value Ref Range   Heparin Unfractionated 0.52 0.30 - 0.70 IU/mL    Comment:        IF HEPARIN RESULTS ARE BELOW EXPECTED VALUES, AND PATIENT DOSAGE HAS BEEN CONFIRMED, SUGGEST FOLLOW UP TESTING OF ANTITHROMBIN III LEVELS.   Basic metabolic panel     Status: Abnormal   Collection Time: 02/27/17  3:13 AM  Result Value Ref Range   Sodium 134 (L) 135 - 145 mmol/L   Potassium 3.9 3.5 - 5.1  mmol/L   Chloride 101 101 - 111 mmol/L   CO2 27 22 - 32 mmol/L   Glucose, Bld 102 (H) 65 - 99 mg/dL   BUN 11 6 - 20 mg/dL   Creatinine, Ser 0.86 0.61 - 1.24 mg/dL   Calcium 9.0 8.9 - 10.3 mg/dL   GFR calc non Af Amer >60 >60 mL/min   GFR calc Af Amer >60 >60 mL/min    Comment: (NOTE) The eGFR has been calculated using the CKD EPI equation. This calculation has not been validated in all clinical situations. eGFR's persistently <60 mL/min signify possible Chronic Kidney Disease.    Anion gap 6 5 - 15  Magnesium     Status: None   Collection Time: 02/27/17  3:13 AM  Result Value Ref Range   Magnesium 2.2 1.7 - 2.4 mg/dL  Phosphorus     Status: None   Collection Time: 02/27/17  3:13 AM  Result Value Ref Range   Phosphorus 4.2 2.5 - 4.6 mg/dL  Glucose, capillary     Status: Abnormal   Collection Time: 02/27/17  7:23 AM  Result Value Ref Range   Glucose-Capillary 102 (H) 65 - 99 mg/dL  Glucose, capillary     Status: Abnormal   Collection Time: 02/27/17 11:16 AM  Result Value Ref Range   Glucose-Capillary 103 (H) 65 - 99 mg/dL  Glucose, capillary     Status: Abnormal   Collection Time: 02/27/17  4:55 PM  Result Value Ref Range   Glucose-Capillary 127 (H) 65 - 99 mg/dL  CBC     Status: Abnormal   Collection Time: 02/28/17  4:36 AM  Result Value Ref Range   WBC 4.0 3.8 - 10.6 K/uL   RBC 3.97 (L) 4.40 - 5.90 MIL/uL   Hemoglobin 9.1 (L) 13.0 - 18.0 g/dL   HCT 29.9 (L) 40.0 - 52.0 %   MCV 75.3 (L) 80.0 - 100.0 fL   MCH 22.8 (L) 26.0 - 34.0 pg   MCHC 30.3 (L) 32.0 - 36.0 g/dL   RDW 21.0 (H) 11.5 - 14.5 %   Platelets 190 150 - 440 K/uL  Basic metabolic panel     Status: Abnormal   Collection Time: 02/28/17  4:36 AM  Result Value Ref Range   Sodium 137 135 - 145 mmol/L   Potassium 4.1 3.5 - 5.1 mmol/L   Chloride 102 101 - 111  mmol/L   CO2 25 22 - 32 mmol/L   Glucose, Bld 130 (H) 65 - 99 mg/dL   BUN 12 6 - 20 mg/dL   Creatinine, Ser 0.85 0.61 - 1.24 mg/dL   Calcium 9.1 8.9 -  10.3 mg/dL   GFR calc non Af Amer >60 >60 mL/min   GFR calc Af Amer >60 >60 mL/min    Comment: (NOTE) The eGFR has been calculated using the CKD EPI equation. This calculation has not been validated in all clinical situations. eGFR's persistently <60 mL/min signify possible Chronic Kidney Disease.    Anion gap 10 5 - 15    Current Facility-Administered Medications  Medication Dose Route Frequency Provider Last Rate Last Dose  . acetaminophen (TYLENOL) tablet 650 mg  650 mg Oral Q6H PRN Henreitta Leber, MD      . amiodarone (PACERONE) tablet 200 mg  200 mg Oral Daily Neoma Laming A, MD   200 mg at 02/28/17 4944  . apixaban (ELIQUIS) tablet 5 mg  5 mg Oral BID Gladstone Lighter, MD   5 mg at 02/28/17 0902  . carvedilol (COREG) tablet 6.25 mg  6.25 mg Oral BID WC Wilhelmina Mcardle, MD   6.25 mg at 02/28/17 1621  . citalopram (CELEXA) tablet 20 mg  20 mg Oral Daily Henreitta Leber, MD   20 mg at 02/28/17 0903  . folic acid (FOLVITE) tablet 1 mg  1 mg Oral Daily Hinda Kehr, MD   1 mg at 02/28/17 9675  . gabapentin (NEURONTIN) capsule 600 mg  600 mg Oral TID Henreitta Leber, MD   600 mg at 02/28/17 1621  . lisinopril (PRINIVIL,ZESTRIL) tablet 10 mg  10 mg Oral Daily Henreitta Leber, MD   10 mg at 02/28/17 0902  . LORazepam (ATIVAN) injection 1 mg  1 mg Intravenous Q2H PRN Wilhelmina Mcardle, MD   1 mg at 02/28/17 0805  . ondansetron (ZOFRAN) injection 4 mg  4 mg Intravenous Q6H PRN Henreitta Leber, MD      . oxyCODONE (Oxy IR/ROXICODONE) immediate release tablet 5 mg  5 mg Oral TID Wilhelmina Mcardle, MD   5 mg at 02/28/17 1621  . oxyCODONE-acetaminophen (PERCOCET/ROXICET) 5-325 MG per tablet 1-2 tablet  1-2 tablet Oral Q6H PRN Wilhelmina Mcardle, MD   2 tablet at 02/28/17 1328  . pneumococcal 23 valent vaccine (PNU-IMMUNE) injection 0.5 mL  0.5 mL Intramuscular Tomorrow-1000 Wilhelmina Mcardle, MD   Stopped at 02/26/17 1000  . thiamine (VITAMIN B-1) tablet 100 mg  100 mg Oral Daily Hinda Kehr, MD   100 mg at 02/28/17 0902  . vitamin B-12 (CYANOCOBALAMIN) tablet 1,000 mcg  1,000 mcg Oral Daily Dorene Sorrow S, NP   1,000 mcg at 02/28/17 0903    Musculoskeletal: Strength & Muscle Tone: within normal limits Gait & Station: unable to stand Patient leans: N/A  Psychiatric Specialty Exam: Physical Exam  Nursing note and vitals reviewed. Constitutional: He appears well-developed and well-nourished.  HENT:  Head: Normocephalic and atraumatic.  Eyes: Pupils are equal, round, and reactive to light. Conjunctivae are normal.  Neck: Normal range of motion.  Cardiovascular: Regular rhythm and normal heart sounds.   Respiratory: Effort normal. No respiratory distress.  GI: Soft.  Musculoskeletal: Normal range of motion.  Neurological: He is alert.  Skin: Skin is warm and dry.  Psychiatric: His speech is normal. His affect is blunt. He is slowed. Thought content is not paranoid. He expresses impulsivity. He expresses  no homicidal and no suicidal ideation. He exhibits abnormal recent memory.    Review of Systems  Unable to perform ROS: Medical condition  Constitutional: Negative.   HENT: Negative.   Eyes: Negative.   Respiratory: Negative.   Cardiovascular: Negative.   Gastrointestinal: Negative.   Musculoskeletal: Negative.   Skin: Negative.   Neurological: Negative.   Psychiatric/Behavioral: Positive for memory loss and substance abuse. Negative for depression, hallucinations and suicidal ideas. The patient is not nervous/anxious and does not have insomnia.     Blood pressure (!) 104/58, pulse 77, temperature 98 F (36.7 C), temperature source Oral, resp. rate 12, height _0  (1.778 m), weight 182 lb 1.6 oz (82.6 kg), SpO2 92 %.Body mass index is 26.13 kg/m.  General Appearance: Casual  Eye Contact:  None  Speech:  Negative  Volume:  Decreased  Mood:  Negative  Affect:  Negative  Thought Process:  NA  Orientation:  Negative  Thought Content:  Negative   Suicidal Thoughts:  No  Homicidal Thoughts:  No  Memory:  Immediate;   Negative Recent;   Negative  Judgement:  Negative  Insight:  Negative  Psychomotor Activity:  Negative  Concentration:  Concentration: Negative  Recall:  Negative  Fund of Knowledge:  Negative  Language:  Negative  Akathisia:  Negative  Handed:  Right  AIMS (if indicated):     Assets:  Resilience  ADL's:  Impaired  Cognition:  Impaired,  Moderate  Sleep:        Treatment Plan Summary: Medication management and Plan If he does well tonight off of the Precedex then may be he is finally through with the delirium and could be discharged soon.  I tried to have a serious conversation with him impressing on him that he was drinking himself to death and that he needs to make that he is prime motivation for change.  As I mentioned above I am not sure how seriously he really is capable of taking this.  No indication for psychiatric hospitalization.  Follow-up tomorrow.  Disposition: No evidence of imminent risk to self or others at present.   Patient does not meet criteria for psychiatric inpatient admission.  Alethia Berthold, MD 02/28/2017 6:44 PM

## 2017-02-28 NOTE — Progress Notes (Signed)
SUBJECTIVE: Pt is lethargic but denies chest pain or shortness of breath. He is anxious to leave.   Vitals:   02/28/17 0400 02/28/17 0500 02/28/17 0600 02/28/17 0800  BP: 109/77 104/75 93/70 112/77  Pulse:      Resp: 17 10 12 10   Temp:    (!) 97.5 F (36.4 C)  TempSrc:    Axillary  SpO2:      Weight:      Height:        Intake/Output Summary (Last 24 hours) at 02/28/17 1228 Last data filed at 02/28/17 1149  Gross per 24 hour  Intake           903.63 ml  Output              950 ml  Net           -46.37 ml    LABS: Basic Metabolic Panel:  Recent Labs  16/02/9609/22/18 0406 02/27/17 0313 02/28/17 0436  NA 136 134* 137  K 3.3* 3.9 4.1  CL 97* 101 102  CO2 27 27 25   GLUCOSE 152* 102* 130*  BUN 12 11 12   CREATININE 0.78 0.86 0.85  CALCIUM 8.9 9.0 9.1  MG 1.4* 2.2  --   PHOS 3.6 4.2  --    Liver Function Tests:  Recent Labs  02/25/17 2115  AST 42*  ALT 26  ALKPHOS 70  BILITOT 1.0  PROT 6.7  ALBUMIN 3.9   No results for input(s): LIPASE, AMYLASE in the last 72 hours. CBC:  Recent Labs  02/26/17 0406 02/28/17 0436  WBC 5.1 4.0  HGB 9.1* 9.1*  HCT 29.9* 29.9*  MCV 74.5* 75.3*  PLT 218 190   Cardiac Enzymes:  Recent Labs  02/25/17 1444 02/25/17 1837 02/25/17 2115  TROPONINI 0.03* 0.03* <0.03   BNP: Invalid input(s): POCBNP D-Dimer: No results for input(s): DDIMER in the last 72 hours. Hemoglobin A1C: No results for input(s): HGBA1C in the last 72 hours. Fasting Lipid Panel: No results for input(s): CHOL, HDL, LDLCALC, TRIG, CHOLHDL, LDLDIRECT in the last 72 hours. Thyroid Function Tests: No results for input(s): TSH, T4TOTAL, T3FREE, THYROIDAB in the last 72 hours.  Invalid input(s): FREET3 Anemia Panel: No results for input(s): VITAMINB12, FOLATE, FERRITIN, TIBC, IRON, RETICCTPCT in the last 72 hours.   PHYSICAL EXAM General: Lethargic but responds appropriately to questions. HEENT:  Normocephalic and atramatic Neck:  No JVD.  Lungs:  Clear bilaterally to auscultation and percussion. Heart: HRRR . Normal S1 and S2 without gallops or murmurs.  Abdomen: Bowel sounds are positive, abdomen soft and non-tender  Msk:  Back normal, normal gait. Normal strength and tone for age. Extremities: No clubbing, cyanosis or edema.   Neuro: Alert and oriented X 3.   TELEMETRY: NSR 67bpm  ASSESSMENT AND PLAN: History of atrial fibrillation with RVR, now in sinus rhythm rate controlled. Continue amiodarone and Eliquis. Pt would not be good candidate for anticoagulation if he continues to struggle with alcohol abuse. LVEF is reduced, continue coreg and lisinopirl. Consider aldactone moving forward. Will continue to follow.   Principal Problem:   Alcohol withdrawal delirium (HCC) Active Problems:   Atrial fibrillation with RVR (HCC)   Acute alcoholic intoxication with complication [facility](HCC)   Alcohol abuse    Caroleen HammanKristin Helma Argyle, NP-C 02/28/2017 12:28 PM

## 2017-02-28 NOTE — Progress Notes (Signed)
He was to be transferred out of the ICU/SDU yesterday but developed agitation and was started back on Precedex.  His cognition appears to be much improved today.  He is calm.  He wishes to ambulate. No distress.  Remains in sinus rhythm  Vitals:   02/28/17 0400 02/28/17 0500 02/28/17 0600 02/28/17 0800  BP: 109/77 104/75 93/70 112/77  Pulse:      Resp: 17 10 12 10   Temp:    (!) 97.5 F (36.4 C)  TempSrc:    Axillary  SpO2:      Weight:      Height:      RA   Gen: NAD HEENT: NCAT, sclerae white Neck: No LAN, no JVD noted Lungs: full BS, no adventitious sounds Cardiovascular: Reg rate, no M noted Abdomen: Soft, NT, +BS Ext: no C/C/E Neuro: PERRL, EOMI, motor/sensory grossly intact  BMP Latest Ref Rng & Units 02/28/2017 02/27/2017 02/26/2017  Glucose 65 - 99 mg/dL 366(Y130(H) 403(K102(H) 742(V152(H)  BUN 6 - 20 mg/dL 12 11 12   Creatinine 0.61 - 1.24 mg/dL 9.560.85 3.870.86 5.640.78  Sodium 135 - 145 mmol/L 137 134(L) 136  Potassium 3.5 - 5.1 mmol/L 4.1 3.9 3.3(L)  Chloride 101 - 111 mmol/L 102 101 97(L)  CO2 22 - 32 mmol/L 25 27 27   Calcium 8.9 - 10.3 mg/dL 9.1 9.0 8.9    CBC Latest Ref Rng & Units 02/28/2017 02/26/2017 02/25/2017  WBC 3.8 - 10.6 K/uL 4.0 5.1 5.6  Hemoglobin 13.0 - 18.0 g/dL 3.3(I9.1(L) 9.5(J9.1(L) 10.1(L)  Hematocrit 40.0 - 52.0 % 29.9(L) 29.9(L) 33.1(L)  Platelets 150 - 440 K/uL 190 218 237    CXR: NNF  IMPRESSION: Alcohol withdrawal syndrome - much improved Chronic pain New onset atrial fibrillation - has converted back to sinus rhythm  PLAN/REC: Transfer to MedSurg with cardiac monitoring Continue anticoagulation -now on oral anticoagulation Continue PRN lorazepam Continue PRN oxycodone  Advance activity -out of bed as much as possible ambulate as much as possible After transfer, PCCM will sign off. Please call if we can be of further assistance    Billy Fischeravid Simonds, MD PCCM service Mobile [phone] Pager [phone] 02/28/2017 1:21 PM

## 2017-02-28 NOTE — Progress Notes (Signed)
Pt attempted to leave the unit after being given pain medication.  Explained to pt that he cannot leave the unit.  Pt requesting staff to take him outside.  Explained to pt that he is here for us to observe him and if he is off the unit, I cannot observe him.  Pt agreed to stay in bed.  Pt setting off bed alarm and when I enter the room, pt is attempting to turn alarm off himself.  Asked pt to call when he wants to get up and to leave the alarm to staff.  Pt agrees. Henriette CombsSarah Ocie Stanzione RN

## 2017-02-28 NOTE — Progress Notes (Signed)
Pt has been taking himself off the monitor all day. Attempted to re-direct pt and explain to pt why he needs to wear the monitoring equipment and he continues to remove same. Offered to ambulate pt around unit and pt stated he wanted to go for a walk outside. Explained to pt that we would need to stay inside and pt refused ambulation.

## 2017-02-28 NOTE — Progress Notes (Signed)
Pt being transferred to room 118. Report called to Duwayne Heckanielle, RN. Pt and belongings transferred to room 118 without incident.

## 2017-02-28 NOTE — Progress Notes (Signed)
[facility] [facility] [place] [facility]   PATIENT NAME: Kurt SimmerJohn Horton    MR#:  [id number]  DATE OF BIRTH:  08/14/1960  SUBJECTIVE:  CHIEF COMPLAINT:   Chief Complaint  Patient presents with  . Alcohol Intoxication  . Psychiatric Evaluation   -Cognition improved yesterday and was supposed to be transferred to the floor, however agitation worsened in the evening and had to be started back on Precedex drip. -Continues to improve today with less tremors and improved cognition  REVIEW OF SYSTEMS:  Review of Systems  Constitutional: Positive for malaise/fatigue. Negative for chills and fever.  HENT: Negative for congestion, ear discharge, hearing loss and nosebleeds.   Eyes: Negative for blurred vision and double vision.  Respiratory: Negative for cough, shortness of breath and wheezing.   Cardiovascular: Negative for chest pain, palpitations and leg swelling.  Gastrointestinal: Negative for abdominal pain, constipation, diarrhea, nausea and vomiting.  Genitourinary: Negative for dysuria.  Musculoskeletal: Positive for back pain. Negative for myalgias.  Neurological: Positive for tremors. Negative for dizziness, speech change, focal weakness, seizures and headaches.  Psychiatric/Behavioral: Negative for depression. The patient is nervous/anxious.     DRUG ALLERGIES:   Allergies  Allergen Reactions  . Other Other (See Comments)    Patient states he can't take any "mycin" medications because myasthenia gravis.    VITALS:  Blood pressure 112/77, pulse 77, temperature (!) 97.5 F (36.4 C), temperature source Axillary, resp. rate 10, height 5\' 10"  (1.778 m), weight 82.6 kg (182 lb 1.6 oz), SpO2 92 %.  PHYSICAL EXAMINATION:  Physical Exam  GENERAL:  56 y.o.-year-old patient lying in the bed with no acute distress.  EYES: Pupils equal, round, reactive to light and accommodation. No scleral icterus. Extraocular muscles intact.  HEENT: Head atraumatic, normocephalic.  Oropharynx and nasopharynx clear.  NECK:  Supple, no jugular venous distention. No thyroid enlargement, no tenderness.  LUNGS: Normal breath sounds bilaterally, no wheezing, rales,rhonchi or crepitation. No use of accessory muscles of respiration.  CARDIOVASCULAR: S1, S2 normal. No murmurs, rubs, or gallops.  ABDOMEN: Soft, nontender, nondistended. Bowel sounds present. No organomegaly or mass.  EXTREMITIES: No pedal edema, cyanosis, or clubbing. Significant tremors of upper extremities noted NEUROLOGIC: Cranial nerves II through XII are intact. Muscle strength 5/5 in all extremities. Following commands.Sensation intact. Gait not checked.  PSYCHIATRIC: The patient is alert and oriented 2, some impulsiveness noted SKIN: No obvious rash, lesion, or ulcer.    LABORATORY PANEL:   CBC  Recent Labs Lab 02/28/17 0436  WBC 4.0  HGB 9.1*  HCT 29.9*  PLT 190   ------------------------------------------------------------------------------------------------------------------  Chemistries   Recent Labs Lab 02/25/17 2115  02/27/17 0313 02/28/17 0436  NA 137  < > 134* 137  K 3.1*  < > 3.9 4.1  CL 96*  < > 101 102  CO2 26  < > 27 25  GLUCOSE 141*  < > 102* 130*  BUN 12  < > 11 12  CREATININE 0.77  < > 0.86 0.85  CALCIUM 8.8*  < > 9.0 9.1  MG 1.4*  < > 2.2  --   AST 42*  --   --   --   ALT 26  --   --   --   ALKPHOS 70  --   --   --   BILITOT 1.0  --   --   --   < > = values in this interval not displayed. ------------------------------------------------------------------------------------------------------------------  Cardiac Enzymes  Recent Labs Lab  02/25/17 2115  TROPONINI <0.03   ------------------------------------------------------------------------------------------------------------------  RADIOLOGY:  No results found.  EKG:   Orders placed or performed during the hospital encounter of 02/24/17  . EKG 12-Lead  . EKG 12-Lead  . EKG 12-Lead  . EKG 12-Lead  .  ED EKG  . ED EKG  . ED EKG  . ED EKG    ASSESSMENT AND PLAN:   56 year old male with past medical history significant for alcohol abuse, depression and anxiety, hypertension degenerative disc disease presents to hospital secondary to alcohol intoxication. Noted to be in A. Fib with RVR  #1 atrial fibrillation with rapid ventricular response- - Off the Cardizem drip, off heparin drip.  Started on oral Eliquis and oral carvedilol -Not sure if he'll be a good candidate for long-term anticoagulation due to history of alcohol abuse and risk for falls. -Appreciate cardiology consult. Echocardiogram  With EF of 40% and diastolic dysfunction.  Dilated left atrium noted indicating chronic A. fib. - Mild elevation in troponin is secondary to demand ischemia  #2 alcohol intoxication- supposed to start inpatient rehabilitation with [facility], we'll get social worker involved. -Continue CIWA protocol, restarted precedex drip - on ativan prn -continue thiamine, folic acid and B12 supplementation.  #3 depression with suicidal ideation-was committed involuntarily in the emergency room. -Patient denies being suicidal. Does have a lot of stressors. Psych consult is appreciated  #4 hypokalemia-replaced  #5 chronic low back pain-continue pain medications as needed  #6-DVT prophylaxis- on eliquis   Once cognition improves- transfer to med/surg floor and encourage ambulation   All the records are reviewed and case discussed with Care Management/Social Workerr. Management plans discussed with the patient, family and they are in agreement.  CODE STATUS: full code  TOTAL TIME TAKING CARE OF THIS PATIENT: 36 minutes.   POSSIBLE D/C IN 2-3 DAYS, DEPENDING ON CLINICAL CONDITION.   Enid Baas M.D on 02/28/2017 at 8:45 AM  Between 7am to 6pm - Pager - [phone]  After 6pm go to [email or web address] - password [email or web address]  [facility]  Office  [phone]  CC: Primary care  physician; Lamar Blinks, MD

## 2017-02-28 NOTE — Clinical Social Work Note (Signed)
CSW aware of consult but patient has not been able to complete assessment due to agitation and being on precedex drip. CSW will see when more appropriate. Psych has seen patient but patient was nonverbal with him yesterday. York SpanielMonica Gearald Stonebraker MSW,LCSW [phone]

## 2017-03-01 LAB — BASIC METABOLIC PANEL
Anion gap: 10 (ref 5–15)
BUN: 16 mg/dL (ref 6–20)
CHLORIDE: 103 mmol/L (ref 101–111)
CO2: 25 mmol/L (ref 22–32)
CREATININE: 0.92 mg/dL (ref 0.61–1.24)
Calcium: 9.3 mg/dL (ref 8.9–10.3)
GFR calc Af Amer: >60 mL/min (ref >60)
GFR calc non Af Amer: >60 mL/min (ref >60)
Glucose, Bld: 107 mg/dL — ABNORMAL HIGH (ref 65–99)
Potassium: 4.3 mmol/L (ref 3.5–5.1)
Sodium: 138 mmol/L (ref 135–145)

## 2017-03-01 LAB — GLUCOSE, CAPILLARY: Glucose-Capillary: 150 mg/dL — ABNORMAL HIGH (ref 65–99)

## 2017-03-01 MED ORDER — AMIODARONE HCL 200 MG PO TABS: 200.0000 mg | ORAL_TABLET | Freq: Every day | ORAL | 2 refills | Status: DC

## 2017-03-01 MED ORDER — GABAPENTIN 300 MG PO CAPS: 300.0000 mg | ORAL_CAPSULE | Freq: Three times a day (TID) | ORAL | 0 refills | Status: DC

## 2017-03-01 MED ORDER — POLYVINYL ALCOHOL 1.4 % OP SOLN
1.0000 [drp] | OPHTHALMIC | Status: DC | PRN
Start: 2017-03-01 — End: 2017-03-01
  Administered 2017-03-01: 10:00:00 1 [drp] via OPHTHALMIC
  Filled 2017-03-01: qty 15

## 2017-03-01 MED ORDER — CARVEDILOL 6.25 MG PO TABS
6.2500 mg | ORAL_TABLET | Freq: Two times a day (BID) | ORAL | 2 refills | Status: DC
Start: 2017-03-01 — End: 2017-08-18

## 2017-03-01 NOTE — Progress Notes (Signed)
$   884.00 dollars returned to patient by security patient signed off on getting money back

## 2017-03-01 NOTE — Progress Notes (Signed)
Pt continues to turn off his bed alarm.  Pt getting up to bathroom without assistance.then pulling emergency light while in the bathroom.  Pt wants to order a pizza, go outside, smoke, generally overactive.  Gave 1 mg Ativan IV. Provided crackers and peanut butter and soda to drink. Henriette CombsSarah Tonnie Friedel RN

## 2017-03-01 NOTE — Progress Notes (Signed)
SUBJECTIVE:Patient is feeling much better   Vitals:   02/28/17 1700 02/28/17 1913 03/01/17 0431 03/01/17 0751  BP: (!) 104/58 134/77 124/71 133/79  Pulse:  67 78 73  Resp: 12 18 13 16   Temp:  98.1 F (36.7 C) 98.6 F (37 C) 98.3 F (36.8 C)  TempSrc:  Oral Oral Oral  SpO2:  100% 100% 98%  Weight:      Height:        Intake/Output Summary (Last 24 hours) at 03/01/17 1139 Last data filed at 03/01/17 1021  Gross per 24 hour  Intake           2579.4 ml  Output              200 ml  Net           2379.4 ml    LABS: Basic Metabolic Panel:  Recent Labs  82/95/6210/23/18 0313 02/28/17 0436 03/01/17 0524  NA 134* 137 138  K 3.9 4.1 4.3  CL 101 102 103  CO2 27 25 25   GLUCOSE 102* 130* 107*  BUN 11 12 16   CREATININE 0.86 0.85 0.92  CALCIUM 9.0 9.1 9.3  MG 2.2  --   --   PHOS 4.2  --   --    Liver Function Tests: No results for input(s): AST, ALT, ALKPHOS, BILITOT, PROT, ALBUMIN in the last 72 hours. No results for input(s): LIPASE, AMYLASE in the last 72 hours. CBC:  Recent Labs  02/28/17 0436  WBC 4.0  HGB 9.1*  HCT 29.9*  MCV 75.3*  PLT 190   Cardiac Enzymes: No results for input(s): CKTOTAL, CKMB, CKMBINDEX, TROPONINI in the last 72 hours. BNP: Invalid input(s): POCBNP D-Dimer: No results for input(s): DDIMER in the last 72 hours. Hemoglobin A1C: No results for input(s): HGBA1C in the last 72 hours. Fasting Lipid Panel: No results for input(s): CHOL, HDL, LDLCALC, TRIG, CHOLHDL, LDLDIRECT in the last 72 hours. Thyroid Function Tests: No results for input(s): TSH, T4TOTAL, T3FREE, THYROIDAB in the last 72 hours.  Invalid input(s): FREET3 Anemia Panel: No results for input(s): VITAMINB12, FOLATE, FERRITIN, TIBC, IRON, RETICCTPCT in the last 72 hours.   PHYSICAL EXAM General: Well developed, well nourished, in no acute distress HEENT:  Normocephalic and atramatic Neck:  No JVD.  Lungs: Clear bilaterally to auscultation and percussion. Heart: HRRR . Normal  S1 and S2 without gallops or murmurs.  Abdomen: Bowel sounds are positive, abdomen soft and non-tender  Msk:  Back normal, normal gait. Normal strength and tone for age. Extremities: No clubbing, cyanosis or edema.   Neuro: Alert and oriented X 3. Psych:  Good affect, responds appropriately  TELEMETRY: NSR  ASSESSMENT AND PLAN: Atriaal fib/CHF , advise f/u next week in office Thursday at 10  AM.  Principal Problem:   Alcohol withdrawal delirium (HCC) Active Problems:   Atrial fibrillation with RVR (HCC)   Acute alcoholic intoxication with complication (HCC)   Alcohol abuse    KHAN,Kurt A, MD, [facility] 03/01/2017 11:39 AM

## 2017-03-01 NOTE — Progress Notes (Signed)
Received MD order to discharge patient to home, reviewed prescriptions and home meds with patient , patient wanted to make his own follow up appointments and refused to let me make them for him

## 2017-03-01 NOTE — Care Management Note (Signed)
Case Management Note  Patient Details  Name: Kurt Horton MRN: [id number] Date of Birth: July 26, 1960  Subjective/Objective:  RNCM consult for Eliquis. Patient has Medicaid so copay will be $3. No further medication needs identified.                   Action/Plan:   Expected Discharge Date:                  Expected Discharge Plan:     In-House Referral:     Discharge planning Services  CM Consult, Medication Assistance  Post Acute Care Choice:    Choice offered to:     DME Arranged:    DME Agency:     HH Arranged:    HH Agency:     Status of Service:  In process, will continue to follow  If discussed at Long Length of Stay Meetings, dates discussed:    Additional Comments:  Marily MemosLisa M Ziya Coonrod, RN 03/01/2017, 10:02 AM

## 2017-03-01 NOTE — Progress Notes (Addendum)
Pt asked to walk around the unit.  Provided walker as pt was shuffling his feet.  Explained to patient that he could walk around the unit but not leave the unit because he is on telemetry. Pt left the unit without permission.  Security called to locate pt. Kurt CombsSarah Travon Crochet RN Pt was found outside smoking.  Once pt returned to room explained to him that if he leaves the unit again, that will be considered leaving AMA and he will be discharged AMA. Kurt CombsSarah Tiffeny Minchew RN

## 2017-03-01 NOTE — Clinical Social Work Note (Signed)
Clinical Social Work Assessment  Patient Details  Name: Kurt Horton MRN: [id number] Date of Birth: [birth year]  Date of referral:  03/01/17               Reason for consult:  Substance Use/ETOH Abuse                Permission sought to share information with:    Permission granted to share information::     Name::        Agency::     Relationship::     Contact Information:     Housing/Transportation Living arrangements for the past 2 months:  Single Family Home Source of Information:  Patient Patient Interpreter Needed:  None Criminal Activity/Legal Involvement Pertinent to Current Situation/Hospitalization:  No - Comment as needed Significant Relationships:  Parents Lives with:  Parents Do you feel safe going back to the place where you live?  Yes Need for family participation in patient care:  No (Coment)  Care giving concerns:  Patient lives with his parents in [place].   Social Worker assessment / plan:  Holiday representative (CSW) received substance abuse consult. Social work Theatre manager met with patient alone at bedside. Patient was alert and orient x4. Social work Theatre manager introduced self and explained the role of the [place]. Patient stated that he lives in [place] with his parents. Social work Theatre manager presented Orthoptist to patient. Patient stated that he has tried to reach out to [facility] before but was unable to make it to his appointment. Patient is agreeable to try again or to visit the [facility]. CSW and social work intern will continue to follow and assist as needed.   Employment status:    Insurance information:  Medicaid In [place] PT Recommendations:    Information / Referral to community resources:     Patient/Family's Response to care:  Patient was agreeable to seeking treatment at [facility] again.  Patient/Family's Understanding of and Emotional Response to Diagnosis, Current Treatment, and Prognosis:  Patient was pleasant  and thanked social work Theatre manager for her assistance.  Emotional Assessment Appearance:  Appears stated age Attitude/Demeanor/Rapport:    Affect (typically observed):  Anxious, Pleasant, Accepting Orientation:  Oriented to Self, Oriented to Place, Oriented to  Time, Oriented to Situation Alcohol / Substance use:  Alcohol Use Psych involvement (Current and /or in the community):     Discharge Needs  Concerns to be addressed:  Substance Abuse Concerns Readmission within the last 30 days:  No Current discharge risk:  Substance Abuse Barriers to Discharge:  No Barriers Identified   [place], [place] Work 03/01/2017, 3:20 PM

## 2017-03-01 NOTE — Discharge Summary (Signed)
[employer] [facility] [place] [facility]   PATIENT NAME: Kurt Horton    MR#:  [id number]  DATE OF BIRTH:  1960/10/20  DATE OF ADMISSION:  02/24/2017   ADMITTING PHYSICIAN: Houston Siren, MD  DATE OF DISCHARGE: 03/01/17  PRIMARY CARE PHYSICIAN: Lamar Blinks, MD   ADMISSION DIAGNOSIS:   Alcohol abuse [F10.10] Atrial fibrillation with rapid ventricular response (HCC) [I48.91] Acute alcoholic intoxication with complication (HCC) [F10.929] Depression, unspecified depression type [F32.9]  DISCHARGE DIAGNOSIS:   Principal Problem:   Alcohol withdrawal delirium (HCC) Active Problems:   Atrial fibrillation with RVR (HCC)   Acute alcoholic intoxication with complication (HCC)   Alcohol abuse   SECONDARY DIAGNOSIS:   Past Medical History:  Diagnosis Date  . Alcohol abuse   . Alcohol withdrawal (HCC) 11/12/2016  . Diabetes mellitus without complication (HCC)   . DJD (degenerative joint disease)   . Hypertension   . Myasthenia gravis (HCC)   . Renal disorder     HOSPITAL COURSE:   56 year old male with past medical history significant for alcohol abuse, depression and anxiety, hypertension degenerative disc disease presents to hospital secondary to alcohol intoxication. Noted to be in A. Fib with RVR  #1 atrial fibrillation with rapid ventricular response- - Off the Cardizem drip, off heparin drip.   - on oral Eliquis and oral carvedilol while in the hospital. -Not sure that he is a good candidate for long-term anticoagulation due to history of alcohol abuse and risk for falls. -Was not discharge on eliquis, we'll let cardiology decide to restart, if he keeps up his follow-up appointments as an outpatient -Continue oral carvedilol and amiodarone -Appreciate cardiology consult. Echocardiogram  With EF of 40% and diastolic dysfunction.  Dilated left atrium noted indicating chronic A. fib. - Mild elevation in troponin is secondary to demand  ischemia  #2 alcohol intoxication- supposed to start inpatient rehabilitation with [facility], but did not check in. -Was on Precedex drip in the ICU, also required IV Ativan as needed -Currently alert and oriented, requesting referral to behavioral health. Not sure if he wants to come off of alcohol completely. -Appreciate psych consult  #3 depression - appreciate psych consult. Not suicidal. On Celexa  #4 hypokalemia-replaced  #5 chronic low back pain-continue pain medications as needed -On gabapentin -No narcotics or benzodiazepines prescribed at discharge  Patient is anxious to leave, will be discharged home today.  DISCHARGE CONDITIONS:   Guarded  CONSULTS OBTAINED:   Treatment Team:  Laurier Nancy, MD Clapacs, Jackquline Denmark, MD  DRUG ALLERGIES:   Allergies  Allergen Reactions  . Other Other (See Comments)    Patient states he can't take any "mycin" medications because myasthenia gravis.   DISCHARGE MEDICATIONS:   Allergies as of 03/01/2017      Reactions   Other Other (See Comments)   Patient states he can't take any "mycin" medications because myasthenia gravis.      Medication List    STOP taking these medications   ALPRAZolam 0.5 MG tablet Commonly known as:  XANAX   clonazePAM 0.5 MG tablet Commonly known as:  KLONOPIN   diltiazem 120 MG 24 hr capsule Commonly known as:  CARDIZEM CD   fentaNYL 50 MCG/HR Commonly known as:  DURAGESIC   lidocaine-prilocaine cream Commonly known as:  EMLA   metoprolol tartrate 25 MG tablet Commonly known as:  LOPRESSOR   mycophenolate 500 MG tablet Commonly known as:  CELLCEPT   predniSONE 10 MG tablet Commonly known as:  DELTASONE   SUMAtriptan 100 MG tablet Commonly known as:  IMITREX   zolpidem 10 MG tablet Commonly known as:  AMBIEN     TAKE these medications   amiodarone 200 MG tablet Commonly known as:  PACERONE Take 1 tablet (200 mg total) by mouth daily.   carvedilol 6.25 MG tablet Commonly  known as:  COREG Take 1 tablet (6.25 mg total) by mouth 2 (two) times daily with a meal.   citalopram 20 MG tablet Commonly known as:  CELEXA Take 1 tablet (20 mg total) by mouth daily.   furosemide 40 MG tablet Commonly known as:  LASIX Take 40 mg by mouth daily as needed for fluid (if taking prednisone).   gabapentin 300 MG capsule Commonly known as:  NEURONTIN Take 1 capsule (300 mg total) by mouth 3 (three) times daily. What changed:  how much to take   lisinopril 10 MG tablet Commonly known as:  PRINIVIL,ZESTRIL Take 1 tablet (10 mg total) by mouth daily.   metFORMIN 500 MG tablet Commonly known as:  GLUCOPHAGE Take 1 tablet (500 mg total) by mouth daily.   oxyCODONE 5 MG immediate release tablet Commonly known as:  Oxy IR/ROXICODONE Take 1 tablet (5 mg total) by mouth every 8 (eight) hours as needed.        DISCHARGE INSTRUCTIONS:   1. Cardiology f/u in 1 week 2. Referral to [facility] health given per patients request  DIET:   Cardiac diet  ACTIVITY:   Activity as tolerated  OXYGEN:   Home Oxygen: No.  Oxygen Delivery: room air  DISCHARGE LOCATION:   home   If you experience worsening of your admission symptoms, develop shortness of breath, life threatening emergency, suicidal or homicidal thoughts you must seek medical attention immediately by calling 911 or calling your MD immediately  if symptoms less severe.  You Must read complete instructions/literature along with all the possible adverse reactions/side effects for all the Medicines you take and that have been prescribed to you. Take any new Medicines after you have completely understood and accpet all the possible adverse reactions/side effects.   Please note  You were cared for by a hospitalist during your hospital stay. If you have any questions about your discharge medications or the care you received while you were in the hospital after you are discharged, you can call the unit and  asked to speak with the hospitalist on call if the hospitalist that took care of you is not available. Once you are discharged, your primary care physician will handle any further medical issues. Please note that NO REFILLS for any discharge medications will be authorized once you are discharged, as it is imperative that you return to your primary care physician (or establish a relationship with a primary care physician if you do not have one) for your aftercare needs so that they can reassess your need for medications and monitor your lab values.    On the day of Discharge:  VITAL SIGNS:   Blood pressure 129/76, pulse 70, temperature 97.9 F (36.6 C), temperature source Oral, resp. rate 18, height 5\' 10"  (1.778 m), weight 82.6 kg (182 lb 1.6 oz), SpO2 100 %.  PHYSICAL EXAMINATION:    GENERAL:  56 y.o.-year-old patient lying in the bed with no acute distress.  EYES: Pupils equal, round, reactive to light and accommodation. No scleral icterus. Extraocular muscles intact.  HEENT: Head atraumatic, normocephalic. Oropharynx and nasopharynx clear.  NECK:  Supple, no jugular venous distention. No thyroid  enlargement, no tenderness.  LUNGS: Normal breath sounds bilaterally, no wheezing, rales,rhonchi or crepitation. No use of accessory muscles of respiration.  CARDIOVASCULAR: S1, S2 normal. No murmurs, rubs, or gallops.  ABDOMEN: Soft, nontender, nondistended. Bowel sounds present. No organomegaly or mass.  EXTREMITIES: No pedal edema, cyanosis, or clubbing. tremors of upper extremities noted NEUROLOGIC: Cranial nerves II through XII are intact. Muscle strength 5/5 in all extremities. Following commands.Sensation intact. Gait not checked.  PSYCHIATRIC: The patient is alert and oriented 3 SKIN: No obvious rash, lesion, or ulcer.   DATA REVIEW:   CBC  Recent Labs Lab 02/28/17 0436  WBC 4.0  HGB 9.1*  HCT 29.9*  PLT 190    Chemistries   Recent Labs Lab 02/25/17 2115  02/27/17 0313   03/01/17 0524  NA 137  < > 134*  < > 138  K 3.1*  < > 3.9  < > 4.3  CL 96*  < > 101  < > 103  CO2 26  < > 27  < > 25  GLUCOSE 141*  < > 102*  < > 107*  BUN 12  < > 11  < > 16  CREATININE 0.77  < > 0.86  < > 0.92  CALCIUM 8.8*  < > 9.0  < > 9.3  MG 1.4*  < > 2.2  --   --   AST 42*  --   --   --   --   ALT 26  --   --   --   --   ALKPHOS 70  --   --   --   --   BILITOT 1.0  --   --   --   --   < > = values in this interval not displayed.   Microbiology Results  Results for orders placed or performed during the hospital encounter of 02/24/17  MRSA PCR Screening     Status: None   Collection Time: 02/25/17  2:23 PM  Result Value Ref Range Status   MRSA by PCR NEGATIVE NEGATIVE Final    Comment:        The GeneXpert MRSA Assay (FDA approved for NASAL specimens only), is one component of a comprehensive MRSA colonization surveillance program. It is not intended to diagnose MRSA infection nor to guide or monitor treatment for MRSA infections.     RADIOLOGY:  No results found.   Management plans discussed with the patient, family and they are in agreement.  CODE STATUS:     Code Status Orders        Start     Ordered   02/25/17 1422  Full code  Continuous     02/25/17 1421    Code Status History    Date Active Date Inactive Code Status Order ID Comments User Context   11/12/2016  3:33 PM 11/14/2016  2:25 PM Full Code [id number]  Marguarite ArbourSparks, Jeffrey D, MD Inpatient   12/26/2015  7:27 PM 12/30/2015  4:04 PM Full Code [id number]  Marguarite ArbourSparks, Jeffrey D, MD Inpatient      TOTAL TIME TAKING CARE OF THIS PATIENT: 37 minutes.    Abass Misener M.D on 03/01/2017 at 1:22 PM  Between 7am to 6pm - Pager - [phone]  After 6pm go to [email or web address] - Social research officer, [email or web address] [email or web address]  [facility] [place] [facility]  Office  [phone]  CC: Primary care physician; Lamar BlinksKowalski, Bruce J, MD   Note: This dictation was prepared with Dragon dictation along with smaller phrase  technology. Any  transcriptional errors that result from this process are unintentional.

## 2017-03-19 ENCOUNTER — Encounter: Payer: Self-pay | Admitting: Emergency Medicine

## 2017-03-19 ENCOUNTER — Emergency Department: Payer: Medicaid Other

## 2017-03-19 ENCOUNTER — Emergency Department
Admission: EM | Admit: 2017-03-19 | Discharge: 2017-03-20 | Disposition: A | Discharge status: Home / Self Care | Payer: Medicaid Other | Attending: Emergency Medicine | Admitting: Emergency Medicine

## 2017-03-19 ENCOUNTER — Other Ambulatory Visit: Payer: Self-pay

## 2017-03-19 DIAGNOSIS — F10929 Alcohol use, unspecified with intoxication, unspecified: Secondary | ICD-10-CM | POA: Diagnosis present

## 2017-03-19 DIAGNOSIS — F10939 Alcohol use, unspecified with withdrawal, unspecified: Secondary | ICD-10-CM | POA: Diagnosis present

## 2017-03-19 DIAGNOSIS — I10 Essential (primary) hypertension: Secondary | ICD-10-CM | POA: Diagnosis not present

## 2017-03-19 DIAGNOSIS — F10120 Alcohol abuse with intoxication, uncomplicated: Secondary | ICD-10-CM | POA: Insufficient documentation

## 2017-03-19 DIAGNOSIS — F1010 Alcohol abuse, uncomplicated: Secondary | ICD-10-CM | POA: Diagnosis not present

## 2017-03-19 DIAGNOSIS — F1020 Alcohol dependence, uncomplicated: Secondary | ICD-10-CM | POA: Diagnosis present

## 2017-03-19 DIAGNOSIS — F329 Major depressive disorder, single episode, unspecified: Secondary | ICD-10-CM | POA: Diagnosis not present

## 2017-03-19 DIAGNOSIS — Z7984 Long term (current) use of oral hypoglycemic drugs: Secondary | ICD-10-CM | POA: Insufficient documentation

## 2017-03-19 DIAGNOSIS — Z79899 Other long term (current) drug therapy: Secondary | ICD-10-CM | POA: Diagnosis not present

## 2017-03-19 DIAGNOSIS — F10920 Alcohol use, unspecified with intoxication, uncomplicated: Secondary | ICD-10-CM

## 2017-03-19 DIAGNOSIS — E119 Type 2 diabetes mellitus without complications: Secondary | ICD-10-CM | POA: Diagnosis not present

## 2017-03-19 DIAGNOSIS — F419 Anxiety disorder, unspecified: Secondary | ICD-10-CM | POA: Diagnosis not present

## 2017-03-19 DIAGNOSIS — F10239 Alcohol dependence with withdrawal, unspecified: Secondary | ICD-10-CM | POA: Diagnosis present

## 2017-03-19 LAB — ETHANOL: ALCOHOL ETHYL (B): 402 mg/dL — AB (ref <10)

## 2017-03-19 LAB — COMPREHENSIVE METABOLIC PANEL
ALK PHOS: 104 U/L (ref 38–126)
ALT: 12 U/L — ABNORMAL LOW (ref 17–63)
ANION GAP: 13 (ref 5–15)
AST: 27 U/L (ref 15–41)
Albumin: 3.8 g/dL (ref 3.5–5.0)
BILIRUBIN TOTAL: 0.4 mg/dL (ref 0.3–1.2)
BUN: 5 mg/dL — ABNORMAL LOW (ref 6–20)
CALCIUM: 8.6 mg/dL — AB (ref 8.9–10.3)
CO2: 25 mmol/L (ref 22–32)
CREATININE: 0.81 mg/dL (ref 0.61–1.24)
Chloride: 105 mmol/L (ref 101–111)
Glucose, Bld: 117 mg/dL — ABNORMAL HIGH (ref 65–99)
Potassium: 3.4 mmol/L — ABNORMAL LOW (ref 3.5–5.1)
SODIUM: 143 mmol/L (ref 135–145)
TOTAL PROTEIN: 6.8 g/dL (ref 6.5–8.1)

## 2017-03-19 LAB — ACETAMINOPHEN LEVEL: Acetaminophen (Tylenol), Serum: <10 ug/mL — ABNORMAL LOW (ref 10–30)

## 2017-03-19 LAB — URINALYSIS, COMPLETE (UACMP) WITH MICROSCOPIC
Bacteria, UA: NONE SEEN
Bilirubin Urine: NEGATIVE
GLUCOSE, UA: NEGATIVE mg/dL
HGB URINE DIPSTICK: NEGATIVE
Ketones, ur: NEGATIVE mg/dL
Leukocytes, UA: NEGATIVE
Nitrite: NEGATIVE
PROTEIN: NEGATIVE mg/dL
SPECIFIC GRAVITY, URINE: 1.004 — AB (ref 1.005–1.030)
SQUAMOUS EPITHELIAL / LPF: NONE SEEN
pH: 6 (ref 5.0–8.0)

## 2017-03-19 LAB — CBC WITH DIFFERENTIAL/PLATELET
Basophils Absolute: 0.1 10*3/uL (ref 0–0.1)
Basophils Relative: 4 %
EOS ABS: 0.1 10*3/uL (ref 0–0.7)
EOS PCT: 3 %
HCT: 36.8 % — ABNORMAL LOW (ref 40.0–52.0)
Hemoglobin: 11.1 g/dL — ABNORMAL LOW (ref 13.0–18.0)
LYMPHS ABS: 1.6 10*3/uL (ref 1.0–3.6)
LYMPHS PCT: 43 %
MCH: 22 pg — AB (ref 26.0–34.0)
MCHC: 30.2 g/dL — AB (ref 32.0–36.0)
MCV: 72.9 fL — AB (ref 80.0–100.0)
MONO ABS: 0.3 10*3/uL (ref 0.2–1.0)
MONOS PCT: 8 %
Neutro Abs: 1.5 10*3/uL (ref 1.4–6.5)
Neutrophils Relative %: 42 %
PLATELETS: 354 10*3/uL (ref 150–440)
RBC: 5.05 MIL/uL (ref 4.40–5.90)
RDW: 20.7 % — ABNORMAL HIGH (ref 11.5–14.5)
WBC: 3.6 10*3/uL — ABNORMAL LOW (ref 3.8–10.6)

## 2017-03-19 LAB — URINE DRUG SCREEN, QUALITATIVE (ARMC ONLY)
AMPHETAMINES, UR SCREEN: NOT DETECTED
Barbiturates, Ur Screen: NOT DETECTED
Benzodiazepine, Ur Scrn: POSITIVE — AB
CANNABINOID 50 NG, UR ~~LOC~~: NOT DETECTED
COCAINE METABOLITE, UR ~~LOC~~: NOT DETECTED
MDMA (ECSTASY) UR SCREEN: NOT DETECTED
Methadone Scn, Ur: NOT DETECTED
Opiate, Ur Screen: NOT DETECTED
Phencyclidine (PCP) Ur S: NOT DETECTED
TRICYCLIC, UR SCREEN: NOT DETECTED

## 2017-03-19 LAB — TROPONIN I: Troponin I: <0.03 ng/mL (ref <0.03)

## 2017-03-19 LAB — SALICYLATE LEVEL

## 2017-03-19 LAB — LACTIC ACID, PLASMA: Lactic Acid, Venous: 3.4 mmol/L (ref 0.5–1.9)

## 2017-03-19 LAB — LIPASE, BLOOD: LIPASE: 35 U/L (ref 11–51)

## 2017-03-19 MED ORDER — THIAMINE HCL 100 MG/ML IJ SOLN
Freq: Once | INTRAVENOUS | Status: AC
  Administered 2017-03-19: 17:00:00 via INTRAVENOUS
  Filled 2017-03-19: qty 1000

## 2017-03-19 MED ORDER — LORAZEPAM 2 MG PO TABS
2.0000 mg | ORAL_TABLET | Freq: Once | ORAL | Status: AC
  Administered 2017-03-19: 2 mg via ORAL
  Filled 2017-03-19: qty 1

## 2017-03-19 MED ORDER — KETOROLAC TROMETHAMINE 30 MG/ML IJ SOLN
30.0000 mg | Freq: Once | INTRAMUSCULAR | Status: AC
  Administered 2017-03-19: 30 mg via INTRAVENOUS
  Filled 2017-03-19: qty 1

## 2017-03-19 NOTE — ED Notes (Signed)
Date and time results received: 03/19/17 1648 (use smartphrase ".now" to insert current time)  Test: alcohol Critical Value: 402  Name of Provider Notified: Dr. Darnelle CatalanMalinda  Orders Received? Or Actions Taken?: Orders Received - See Orders for details

## 2017-03-19 NOTE — ED Notes (Signed)
Pt states "all you're giving me is toradol, toradol is nothing, I need morphine, fucking toradol". Pt asked to refrain from speaking profanity. Pt laying on stretcher. Respirations even and non-labored. Pt given a cup of water per request.

## 2017-03-19 NOTE — ED Notes (Signed)
Pt sleeping. Respirations even and non labored. Pt snoring.

## 2017-03-19 NOTE — ED Triage Notes (Signed)
Pt arrived via EMS from home for reports of alcohol intoxication. Upon arrival pt soaked in urine. Pt alert. Pt states he drank a lot today and wants help to stop drinking. EMS reports pt was semi combative and would not allow them to take a BP, EMS reports 100 HR, 95% RA. Pt cooperative on arrival. Pt clothing removed, pt hygiene given and pt dressed in wine colored paper scrubs.

## 2017-03-19 NOTE — ED Notes (Signed)
Pt sleeping , respirations even and non labored

## 2017-03-19 NOTE — ED Provider Notes (Signed)
[facility] Emergency Department Provider Note   ____________________________________________   First MD Initiated Contact with Patient 03/19/17 1541     (approximate)  I have reviewed the triage vital signs and the nursing notes.   HISTORY  Chief Complaint Alcohol Intoxication   HPI Kurt Horton is a 56 y.o. male Reports she's been drinking heavily and wants to get off of it. He has trouble staying awake he is so drunk. He denies any headache and neckache chest pain belly pain he says he has chronic back pain has been going on for years and asked for something for that. He subsequently can get morphine for her but immediately falls asleep.patient says he gets bad withdrawal when he starts to withdrawal.  Past Medical History:  Diagnosis Date  . Alcohol abuse   . Alcohol withdrawal (HCC) 11/12/2016  . Diabetes mellitus without complication (HCC)   . DJD (degenerative joint disease)   . Hypertension   . Myasthenia gravis (HCC)   . Renal disorder     Patient Active Problem List   Diagnosis Date Noted  . Alcohol withdrawal delirium (HCC) 02/26/2017  . Atrial fibrillation with RVR (HCC) 02/25/2017  . Acute alcoholic intoxication with complication (HCC)   . Alcohol abuse   . Alcohol withdrawal (HCC) 11/13/2016  . Substance induced mood disorder (HCC) 11/13/2016  . SVT (supraventricular tachycardia) (HCC) 11/12/2016  . HTN (hypertension) 11/12/2016  . EtOH dependence (HCC) 11/12/2016  . Lumbar radiculopathy 08/24/2016  . Depression with anxiety 08/24/2016  . Chronic low back pain 01/04/2016  . Pressure ulcer 12/29/2015  . Osteomyelitis (HCC) 12/26/2015  . PVD (peripheral vascular disease) (HCC) 12/26/2015  . Type II diabetes mellitus with manifestations (HCC) 12/26/2015  . Myasthenia gravis (HCC) 12/26/2015    No past surgical history on file.  Prior to Admission medications   Medication Sig Start Date End Date Taking? Authorizing Provider    amiodarone (PACERONE) 200 MG tablet Take 1 tablet (200 mg total) by mouth daily. 03/02/17   Enid BaasKalisetti, Radhika, MD  carvedilol (COREG) 6.25 MG tablet Take 1 tablet (6.25 mg total) by mouth 2 (two) times daily with a meal. 03/01/17   Enid BaasKalisetti, Radhika, MD  citalopram (CELEXA) 20 MG tablet Take 1 tablet (20 mg total) by mouth daily. 08/24/16   Sater, Pearletha Furlichard A, MD  furosemide (LASIX) 40 MG tablet Take 40 mg by mouth daily as needed for fluid (if taking prednisone).     [provider]  gabapentin (NEURONTIN) 300 MG capsule Take 1 capsule (300 mg total) by mouth 3 (three) times daily. 03/01/17   Enid BaasKalisetti, Radhika, MD  lisinopril (PRINIVIL,ZESTRIL) 10 MG tablet Take 1 tablet (10 mg total) by mouth daily. 12/30/15   Alford HighlandWieting, Richard, MD  metFORMIN (GLUCOPHAGE) 500 MG tablet Take 1 tablet (500 mg total) by mouth daily. 12/30/15   Alford HighlandWieting, Richard, MD  oxyCODONE (OXY IR/ROXICODONE) 5 MG immediate release tablet Take 1 tablet (5 mg total) by mouth every 8 (eight) hours as needed. 10/11/16   Sater, Pearletha Furlichard A, MD    Allergies Other  Family History  Problem Relation Age of Onset  . Rheum arthritis Father     Social History Social History   Tobacco Use  . Smoking status: Never Smoker  . Smokeless tobacco: Never Used  Substance Use Topics  . Alcohol use: Yes    Comment: Pt states that last drink was today  . Drug use: Not on file    Review of Systems  Constitutional: No  fever/chills Eyes: No visual changes. ENT: No sore throat. Cardiovascular: Denies chest pain. Respiratory: Denies shortness of breath. Gastrointestinal: No abdominal pain.  No nausea, no vomiting.  No diarrhea.  No constipation. Genitourinary: Negative for dysuria. Musculoskeletal: chronicr back pain. Skin: Negative for rash. Neurological: Negative for headaches, focal weakness  ____________________________________________   PHYSICAL EXAM:  VITAL SIGNS: ED Triage Vitals  Enc Vitals Group     BP 03/19/17  1530 (!) 149/97     Pulse Rate 03/19/17 1530 92     Resp 03/19/17 1530 18     Temp 03/19/17 1530 97.6 F (36.4 C)     Temp Source 03/19/17 1530 Oral     SpO2 03/19/17 1530 97 %     Weight 03/19/17 1530 180 lb (81.6 kg)     Height --      Head Circumference --      Peak Flow --      Pain Score 03/19/17 1529 0     Pain Loc --      Pain Edu? --      Excl. in GC? --     Constitutional: Alert and oriented. Well appearing and in no acute distress. Eyes: Conjunctivae are normal.  Head: Atraumatic. Nose: No congestion/rhinnorhea. Mouth/Throat: Mucous membranes are moist.  Oropharynx non-erythematous. Neck: No stridor.  Cardiovascular: Normal rate, regular rhythm. Grossly normal heart sounds.  Good peripheral circulation. Respiratory: Normal respiratory effort.  No retractions. Lungs CTAB. Gastrointestinal: Soft and nontender. No distention. No abdominal bruits. No CVA tenderness. Musculoskeletal: No lower extremity tenderness nor edema.  No joint effusions. Neurologic:  Normal speech and language. No gross focal neurologic deficits are appreciated.  Skin:  Skin is warm, dry and intact. No rash noted.  ____________________________________________   LABS (all labs ordered are listed, but only abnormal results are displayed)  Labs Reviewed  ACETAMINOPHEN LEVEL - Abnormal; Notable for the following components:      Result Value   Acetaminophen (Tylenol), Serum <10 (*)    All other components within normal limits  COMPREHENSIVE METABOLIC PANEL - Abnormal; Notable for the following components:   Potassium 3.4 (*)    Glucose, Bld 117 (*)    BUN 5 (*)    Calcium 8.6 (*)    ALT 12 (*)    All other components within normal limits  ETHANOL - Abnormal; Notable for the following components:   Alcohol, Ethyl (B) 402 (*)    All other components within normal limits  LACTIC ACID, PLASMA - Abnormal; Notable for the following components:   Lactic Acid, Venous 3.4 (*)    All other components  within normal limits  CBC WITH DIFFERENTIAL/PLATELET - Abnormal; Notable for the following components:   WBC 3.6 (*)    Hemoglobin 11.1 (*)    HCT 36.8 (*)    MCV 72.9 (*)    MCH 22.0 (*)    MCHC 30.2 (*)    RDW 20.7 (*)    All other components within normal limits  URINALYSIS, COMPLETE (UACMP) WITH MICROSCOPIC - Abnormal; Notable for the following components:   Color, Urine STRAW (*)    APPearance CLEAR (*)    Specific Gravity, Urine 1.004 (*)    All other components within normal limits  URINE DRUG SCREEN, QUALITATIVE ([facility] ONLY) - Abnormal; Notable for the following components:   Benzodiazepine, Ur Scrn POSITIVE (*)    All other components within normal limits  LIPASE, BLOOD  SALICYLATE LEVEL  TROPONIN I   ____________________________________________  EKG  EKG read and interpreted by me shows normal sinus rhythm rate of 73 left axis no acute ST-T wave changes ____________________________________________  RADIOLOGY  Dg Chest Portable 1 View  Result Date: 03/19/2017 CLINICAL DATA:  Alcohol intoxication. EXAM: PORTABLE CHEST 1 VIEW COMPARISON:  None. FINDINGS: Low lung volumes accentuate cardiomediastinal silhouette. There is mild vascular congestion, but no definite consolidation or edema. No effusion or pneumothorax. Bones appear unremarkable. IMPRESSION: Low lung volumes.  No definite active infiltrates or failure. Electronically Signed   By: Elsie Stain M.D.   On: 03/19/2017 17:05   agree with above interpretation ____________________________________________   PROCEDURES  Procedure(s) performed: Procedures  Critical Care performed:   ____________________________________________   INITIAL IMPRESSION / ASSESSMENT AND PLAN / ED COURSE  As part of my medical decision making, I reviewed the following data within the electronic MEDICAL RECORD NUMBER     hospital records and care everywhere were reviewed. Patient's problems appear to be very limited to acute alcohol  intoxication at present. We'll just have to watch him carefully to make sure he does not begin to withdraw.  ----------------------------------------- 11:43 PM on 03/19/2017 -----------------------------------------  Patient now says he just wants to go home. He is still too drunk to do this however we will watch him until he is more sober and then if he still wants to go home we will be able to do so. ____________________________________________   FINAL CLINICAL IMPRESSION(S) / ED DIAGNOSES  Final diagnoses:  Alcoholic intoxication without complication [facility])     ED Discharge Orders    None       Note:  This document was prepared using Dragon voice recognition software and may include unintentional dictation errors.    Arnaldo Natal, MD 03/19/17 [phone]

## 2017-03-19 NOTE — ED Notes (Addendum)
Pt asking for pain medication for his chronic back pain and for something to help with his tremors. Dr. Darnelle CatalanMalinda aware. Orders received. Slight tremors noted when handing pt apple juice.

## 2017-03-19 NOTE — ED Notes (Signed)
Date and time results received: 03/19/17 1648(use smartphrase ".now" to insert current time)  Test: lactic acid Critical Value: 3.4  Name of Provider Notified: Dr. Darnelle CatalanMalinda  Orders Received? Or Actions Taken?: Orders Received - See Orders for details

## 2017-03-19 NOTE — BH Assessment (Signed)
Assessment Note  Kurt Horton is an 56 y.o. male. Kurt Horton reports that he came to the hospital due to feeling ill due to drinking.  He states that he does not want to seek detox treatment at this time, as drinking is the only treatment he has for pain. He reports that he is unsure if he is depressed.  He states that he can't sleep at night but he stays tired. He denied symptoms of anxiety. He denied auditory or visual hallucinations.  He denied suicidal or homicidal ideation or intent. He shared "I was inebriated and I felt badly because I had not eaten in several days." He reports that he is having relationship stressors due to his divorce a year ago. He reports that he is having a difficult time with tremors. BAC is 402  Diagnosis: Alcohol Misuse/Abuse  Past Medical History:  Past Medical History:  Diagnosis Date  . Alcohol abuse   . Alcohol withdrawal (HCC) 11/12/2016  . Diabetes mellitus without complication (HCC)   . DJD (degenerative joint disease)   . Hypertension   . Myasthenia gravis (HCC)   . Renal disorder     No past surgical history on file.  Family History:  Family History  Problem Relation Age of Onset  . Rheum arthritis Father     Social History:  reports that  has never smoked. he has never used smokeless tobacco. He reports that he drinks alcohol. His drug history is not on file.  Additional Social History:  Alcohol / Drug Use History of alcohol / drug use?: Yes Substance #1 Name of Substance 1: Alcohol 1 - Age of First Use: 18 1 - Amount (size/oz): "a bit" 1 - Frequency: daily 1 - Last Use / Amount: 03/19/2017  CIWA: CIWA-Ar BP: (!) 150/89 Pulse Rate: 84 Nausea and Vomiting: no nausea and no vomiting Tactile Disturbances: none Tremor: not visible, but can be felt fingertip to fingertip Auditory Disturbances: not present Paroxysmal Sweats: no sweat visible Visual Disturbances: not present Anxiety: mildly anxious Headache, Fullness in Head: none  present Agitation: normal activity Orientation and Clouding of Sensorium: oriented and can do serial additions CIWA-Ar Total: 2 COWS:    Allergies:  Allergies  Allergen Reactions  . Other Other (See Comments)    Patient states he can't take any "mycin" medications because myasthenia gravis.    Home Medications:  (Not in a hospital admission)  OB/GYN Status:  No LMP for male patient.  General Assessment Data [facility] Assessment: In system Is this a Tele or Face-to-Face Assessment?: Face-to-Face Is this an Initial Assessment or a Re-assessment for this encounter?: Initial Assessment Marital status: Single Maiden name: n/a Is patient pregnant?: No Pregnancy Status: No Living Arrangements: Parent Can pt return to current living arrangement?: Yes Admission Status: Voluntary Is patient capable of signing voluntary admission?: Yes Referral Source: Self/Family/Friend Insurance type: Medicaid  Medical Screening Exam [facility] Walk-in ONLY) Medical Exam completed: Yes  Crisis Care Plan Living Arrangements: Parent Legal Guardian: Other:(Self) Name of Psychiatrist: None at this time Name of Therapist: [facility]  Education Status Is patient currently in school?: No Current Grade: n/a Highest grade of school patient has completed: Master Degree Name of school: [employer] Contact person: n/a  Risk to self with the past 6 months Suicidal Ideation: No Has patient been a risk to self within the past 6 months prior to admission? : No Suicidal Intent: No Has patient had any suicidal intent within the past  6 months prior to admission? : No Is patient at risk for suicide?: No, but patient needs Medical Clearance Suicidal Plan?: No Has patient had any suicidal plan within the past 6 months prior to admission? : No Access to Means: No What has been your use of drugs/alcohol within the last 12 months?: Daily use of alcohol Previous  Attempts/Gestures: No How many times?: 0 Other Self Harm Risks: Alcohol abuse Triggers for Past Attempts: None known Intentional Self Injurious Behavior: None Family Suicide History: No Recent stressful life event(s): Divorce(a year ago) Persecutory voices/beliefs?: No Depression: Yes Depression Symptoms: Insomnia Substance abuse history and/or treatment for substance abuse?: Yes Suicide prevention information given to non-admitted patients: Not applicable  Risk to Others within the past 6 months Homicidal Ideation: No-Not Currently/Within Last 6 Months Does patient have any lifetime risk of violence toward others beyond the six months prior to admission? : Yes (comment) Thoughts of Harm to Others: No Current Homicidal Intent: No Current Homicidal Plan: No Access to Homicidal Means: No Identified Victim: None identified History of harm to others?: No Assessment of Violence: None Noted Does patient have access to weapons?: No Criminal Charges Pending?: No Does patient have a court date: No Is patient on probation?: No  Psychosis Hallucinations: None noted Delusions: None noted  Mental Status Report Appearance/Hygiene: In scrubs Eye Contact: Fair Motor Activity: Restlessness Speech: Logical/coherent Level of Consciousness: Alert Mood: Euthymic Affect: Appropriate to circumstance Anxiety Level: Minimal Thought Processes: Coherent Judgement: Partial Orientation: Person, Place, Situation Obsessive Compulsive Thoughts/Behaviors: None  Cognitive Functioning Concentration: Good Memory: Recent Intact IQ: Average Insight: Poor Impulse Control: Fair Appetite: Poor Sleep: Decreased Vegetative Symptoms: None  ADLScreening [facility] Assessment Services) Patient's cognitive ability adequate to safely complete daily activities?: Yes Patient able to express need for assistance with ADLs?: No Independently performs ADLs?: Yes (appropriate for developmental age)  Prior Inpatient  Therapy Prior Inpatient Therapy: Yes Prior Therapy Dates: 02/2017 Prior Therapy Facilty/Provider(s): [facility]  Reason for Treatment: Alcohol Use Use  Prior Outpatient Therapy Prior Outpatient Therapy: Yes Prior Therapy Dates: Current Prior Therapy Facilty/Provider(s): [facility] Reason for Treatment: SAIOP Does patient have an ACCT team?: No Does patient have Intensive In-House Services?  : No Does patient have [facility] services? : No Does patient have P4CC services?: No  ADL Screening (condition at time of admission) Patient's cognitive ability adequate to safely complete daily activities?: Yes Is the patient deaf or have difficulty hearing?: No Does the patient have difficulty seeing, even when wearing glasses/contacts?: No Does the patient have difficulty concentrating, remembering, or making decisions?: No Patient able to express need for assistance with ADLs?: No Does the patient have difficulty dressing or bathing?: No Independently performs ADLs?: Yes (appropriate for developmental age) Does the patient have difficulty walking or climbing stairs?: No Weakness of Legs: None Weakness of Arms/Hands: None  Home Assistive Devices/Equipment Home Assistive Devices/Equipment: CPAP    Abuse/Neglect Assessment (Assessment to be complete while patient is alone) Physical Abuse: Denies Verbal Abuse: Denies Sexual Abuse: Denies Exploitation of patient/patient's resources: Denies     [employer]chant [employer] officer (For Healthcare) Does Patient Have a Medical Advance Directive?: No Would patient like information on creating a medical advance directive?: No - Patient declined    Additional Information 1:1 In Past 12 Months?: No CIRT Risk: No Elopement Risk: No Does patient have medical clearance?: No     Disposition:  Disposition Initial Assessment Completed for this Encounter: Yes Disposition of Patient: Pending Review with psychiatrist  On Site Evaluation by:  Reviewed with Physician:    Justice DeedsKeisha Harmon Bommarito 03/19/2017 10:04 PM

## 2017-03-19 NOTE — ED Notes (Signed)
Asked to DC repeat lactic lab order by Kurt Horton. Pt sitting on stretcher in hallway; iv fluids infusing.

## 2017-03-19 NOTE — ED Notes (Signed)
Was unable to get an EKG due to patient moving.

## 2017-03-19 NOTE — ED Notes (Signed)
Talked to Dr Juliette AlcideMelinda about EKG on patient. He said not to worry about it now and try again later.

## 2017-03-19 NOTE — BH Assessment (Signed)
Per EDP Dr.Malinda pt in need of RTS detox /substance abuse referral. Pt currently appears intoxicated. EDP states pt may require a few hours before TTS is able to engage him.

## 2017-03-19 NOTE — ED Notes (Signed)
Pt assisted with use of urinal in room 20. Unsteady. Asking for pain medication; specifically narcotic pain medication. Pt states he is no longer intoxicated and needs medication.

## 2017-03-19 NOTE — ED Notes (Signed)
Pt ambulated to restroom. Gait unsteady. ED tech with pt for assistance. Gait belt required for pt safety. IV fluids infusing.

## 2017-03-19 NOTE — ED Notes (Signed)
VOL/Detox °

## 2017-03-20 DIAGNOSIS — F1010 Alcohol abuse, uncomplicated: Secondary | ICD-10-CM | POA: Diagnosis not present

## 2017-03-20 MED ORDER — LORAZEPAM 2 MG/ML IJ SOLN: 0.0000 mg | Freq: Four times a day (QID) | INTRAMUSCULAR | Status: DC

## 2017-03-20 MED ORDER — LORAZEPAM 2 MG/ML IJ SOLN: 0.0000 mg | Freq: Two times a day (BID) | INTRAMUSCULAR | Status: DC

## 2017-03-20 MED ORDER — NICOTINE 14 MG/24HR TD PT24
14.0000 mg | MEDICATED_PATCH | Freq: Once | TRANSDERMAL | Status: DC
  Administered 2017-03-20: 14 mg via TRANSDERMAL
  Filled 2017-03-20: qty 1

## 2017-03-20 MED ORDER — GABAPENTIN 300 MG PO CAPS
600.0000 mg | ORAL_CAPSULE | Freq: Three times a day (TID) | ORAL | Status: DC
  Filled 2017-03-20 (×2): qty 2

## 2017-03-20 MED ORDER — LORAZEPAM 2 MG PO TABS
0.0000 mg | ORAL_TABLET | Freq: Two times a day (BID) | ORAL | Status: DC
Start: 2017-03-22 — End: 2017-03-20

## 2017-03-20 MED ORDER — THIAMINE HCL 100 MG/ML IJ SOLN: 100.0000 mg | Freq: Every day | INTRAMUSCULAR | Status: DC

## 2017-03-20 MED ORDER — LORAZEPAM 2 MG PO TABS
0.0000 mg | ORAL_TABLET | Freq: Four times a day (QID) | ORAL | Status: DC
Start: 2017-03-20 — End: 2017-03-20
  Administered 2017-03-20: 2 mg via ORAL
  Filled 2017-03-20: qty 1

## 2017-03-20 MED ORDER — VITAMIN B-1 100 MG PO TABS
100.0000 mg | ORAL_TABLET | Freq: Every day | ORAL | Status: DC
  Administered 2017-03-20: 100 mg via ORAL
  Filled 2017-03-20: qty 1

## 2017-03-20 MED ORDER — LORAZEPAM 2 MG PO TABS
2.0000 mg | ORAL_TABLET | Freq: Once | ORAL | Status: AC
  Administered 2017-03-20: 2 mg via ORAL
  Filled 2017-03-20: qty 1

## 2017-03-20 NOTE — Consult Note (Signed)
[facility] Consult   Reason for Consult: Consult for 56 year old man with alcohol dependence who came to the emergency room extremely intoxicated yesterday Referring Physician: [name] Patient Identification: Kurt Horton MRN:  [id number] Principal Diagnosis: Alcohol abuse Diagnosis:   Patient Active Problem List   Diagnosis Date Noted  . Alcohol withdrawal delirium ([place]) [F10.231] 02/26/2017  . Atrial fibrillation with RVR ([place]) [I48.91] 02/25/2017  . Acute alcoholic intoxication with complication ([place]) [C37.628]   . Alcohol abuse [F10.10]   . Alcohol withdrawal ([place]) [F10.239] 11/13/2016  . Substance induced mood disorder ([place]) [F19.94] 11/13/2016  . SVT (supraventricular tachycardia) ([place]) [I47.1] 11/12/2016  . HTN (hypertension) [I10] 11/12/2016  . EtOH dependence ([place]) [F10.20] 11/12/2016  . Lumbar radiculopathy [M54.16] 08/24/2016  . Depression with anxiety [F41.8] 08/24/2016  . Chronic low back pain [M54.5, G89.29] 01/04/2016  . Pressure ulcer [L89.90] 12/29/2015  . Osteomyelitis ([place]) [M86.9] 12/26/2015  . PVD (peripheral vascular disease) ([place]) [I73.9] 12/26/2015  . Type II diabetes mellitus with manifestations ([place]) [E11.8] 12/26/2015  . Myasthenia gravis ([place]) [G70.00] 12/26/2015    Total Time spent with patient: 1 hour  Subjective:   Kurt Horton is a 56 y.o. male patient admitted with "I guess I am feeling okay".  HPI: Patient came to the emergency room yesterday very intoxicated delirious blood alcohol level around 400.  He has been sleeping for most of the time since then.  I saw him this morning and found him awake alert a little bit drowsy.  He was oriented x3.  He denied having any visual hallucinations.  Says he has been able to eat a little food today and ambulate slightly.  Patient of course admits that he continues to drink heavily.  As usual will not really specify the total amount.  Denies that he has been abusing any of his pain medicine.   Denies any new stresses.  He says that he had been planning to go for follow-up substance abuse treatment but then relapsed and came into the hospital.  Social history: Currently living with his parents.  Not working.  Medical history: Patient has a history of diabetes chronic pain myasthenia gravis atrial fibrillation multiple medical problems  Substance abuse history: Long-standing heavy alcohol abuse.  Positive for history of seizures and delirium tremens.  Past Psychiatric History: Patient has a history of subjective anxiety no history of suicide attempts or violence.  Has mostly it seems been interested in treatment with benzodiazepines in the past.  Risk to Self: Suicidal Ideation: No Suicidal Intent: No Is patient at risk for suicide?: No, but patient needs Medical Clearance Suicidal Plan?: No Access to Means: No What has been your use of drugs/alcohol within the last 12 months?: Daily use of alcohol How many times?: 0 Other Self Harm Risks: Alcohol abuse Triggers for Past Attempts: None known Intentional Self Injurious Behavior: None Risk to Others: Homicidal Ideation: No-Not Currently/Within Last 6 Months Thoughts of Harm to Others: No Current Homicidal Intent: No Current Homicidal Plan: No Access to Homicidal Means: No Identified Victim: None identified History of harm to others?: No Assessment of Violence: None Noted Does patient have access to weapons?: No Criminal Charges Pending?: No Does patient have a court date: No Prior Inpatient Therapy: Prior Inpatient Therapy: Yes Prior Therapy Dates: 02/2017 Prior Therapy Facilty/Provider(s): [facility]  Reason for Treatment: Alcohol Use Use Prior Outpatient Therapy: Prior Outpatient Therapy: Yes Prior Therapy Dates: Current Prior Therapy Facilty/Provider(s): [facility] Reason for Treatment: SAIOP Does patient have an  ACCT team?: No Does patient have Intensive In-House Services?  : No Does patient have  [facility] services? : No Does patient have P4CC services?: No  Past Medical History:  Past Medical History:  Diagnosis Date  . Alcohol abuse   . Alcohol withdrawal ([place]) 11/12/2016  . Diabetes mellitus without complication ([place])   . DJD (degenerative joint disease)   . Hypertension   . Myasthenia gravis ([place])   . Renal disorder    No past surgical history on file. Family History:  Family History  Problem Relation Age of Onset  . Rheum arthritis Father    Family Psychiatric  History: None identified Social History:  Social History   Substance and Sexual Activity  Alcohol Use Yes   Comment: Pt states that last drink was today     Social History   Substance and Sexual Activity  Drug Use Not on file    Social History   Socioeconomic History  . Marital status: Legally Separated    Spouse name: None  . Number of children: None  . Years of education: None  . Highest education level: None  Social Needs  . Financial resource strain: None  . Food insecurity - worry: None  . Food insecurity - inability: None  . Transportation needs - medical: None  . Transportation needs - non-medical: None  Occupational History  . None  Tobacco Use  . Smoking status: Never Smoker  . Smokeless tobacco: Never Used  Substance and Sexual Activity  . Alcohol use: Yes    Comment: Pt states that last drink was today  . Drug use: None  . Sexual activity: None  Other Topics Concern  . None  Social History Narrative  . None   Additional Social History:    Allergies:   Allergies  Allergen Reactions  . Other Other (See Comments)    Patient states he can't take any "mycin" medications because myasthenia gravis.    Labs:  Results for orders placed or performed during the hospital encounter of 03/19/17 (from the past 48 hour(s))  Acetaminophen level     Status: Abnormal   Collection Time: 03/19/17  4:06 PM  Result Value Ref Range   Acetaminophen (Tylenol), Serum <10 (L) 10 - 30 ug/mL     Comment:        THERAPEUTIC CONCENTRATIONS VARY SIGNIFICANTLY. A RANGE OF 10-30 ug/mL MAY BE AN EFFECTIVE CONCENTRATION FOR MANY PATIENTS. HOWEVER, SOME ARE BEST TREATED AT CONCENTRATIONS OUTSIDE THIS RANGE. ACETAMINOPHEN CONCENTRATIONS >150 ug/mL AT 4 HOURS AFTER INGESTION AND >50 ug/mL AT 12 HOURS AFTER INGESTION ARE OFTEN ASSOCIATED WITH TOXIC REACTIONS.   Comprehensive metabolic panel     Status: Abnormal   Collection Time: 03/19/17  4:06 PM  Result Value Ref Range   Sodium 143 135 - 145 mmol/L   Potassium 3.4 (L) 3.5 - 5.1 mmol/L   Chloride 105 101 - 111 mmol/L   CO2 25 22 - 32 mmol/L   Glucose, Bld 117 (H) 65 - 99 mg/dL   BUN 5 (L) 6 - 20 mg/dL   Creatinine, Ser 0.81 0.61 - 1.24 mg/dL   Calcium 8.6 (L) 8.9 - 10.3 mg/dL   Total Protein 6.8 6.5 - 8.1 g/dL   Albumin 3.8 3.5 - 5.0 g/dL   AST 27 15 - 41 U/L   ALT 12 (L) 17 - 63 U/L   Alkaline Phosphatase 104 38 - 126 U/L   Total Bilirubin 0.4 0.3 - 1.2 mg/dL   GFR calc non Af  Amer >60 >60 mL/min   GFR calc Af Amer >60 >60 mL/min    Comment: (NOTE) The eGFR has been calculated using the CKD EPI equation. This calculation has not been validated in all clinical situations. eGFR's persistently <60 mL/min signify possible Chronic Kidney Disease.    Anion gap 13 5 - 15  Ethanol     Status: Abnormal   Collection Time: 03/19/17  4:06 PM  Result Value Ref Range   Alcohol, Ethyl (B) 402 (HH) <10 mg/dL    Comment: CRITICAL RESULT CALLED TO, READ BACK BY AND VERIFIED WITH JANIE BOWEN ON 03/19/17 AT 1644 [place]        LOWEST DETECTABLE LIMIT FOR SERUM ALCOHOL IS 10 mg/dL FOR MEDICAL PURPOSES ONLY   Lipase, blood     Status: None   Collection Time: 03/19/17  4:06 PM  Result Value Ref Range   Lipase 35 11 - 51 U/L  Salicylate level     Status: None   Collection Time: 03/19/17  4:06 PM  Result Value Ref Range   Salicylate Lvl <0.9 2.8 - 30.0 mg/dL  Troponin I     Status: None   Collection Time: 03/19/17  4:06 PM   Result Value Ref Range   Troponin I <0.03 <0.03 ng/mL  CBC with Differential     Status: Abnormal   Collection Time: 03/19/17  4:06 PM  Result Value Ref Range   WBC 3.6 (L) 3.8 - 10.6 K/uL   RBC 5.05 4.40 - 5.90 MIL/uL   Hemoglobin 11.1 (L) 13.0 - 18.0 g/dL    Comment: RESULT REPEATED AND VERIFIED   HCT 36.8 (L) 40.0 - 52.0 %   MCV 72.9 (L) 80.0 - 100.0 fL   MCH 22.0 (L) 26.0 - 34.0 pg   MCHC 30.2 (L) 32.0 - 36.0 g/dL   RDW 20.7 (H) 11.5 - 14.5 %   Platelets 354 150 - 440 K/uL   Neutrophils Relative % 42 %   Neutro Abs 1.5 1.4 - 6.5 K/uL   Lymphocytes Relative 43 %   Lymphs Abs 1.6 1.0 - 3.6 K/uL   Monocytes Relative 8 %   Monocytes Absolute 0.3 0.2 - 1.0 K/uL   Eosinophils Relative 3 %   Eosinophils Absolute 0.1 0 - 0.7 K/uL   Basophils Relative 4 %   Basophils Absolute 0.1 0 - 0.1 K/uL  Lactic acid, plasma     Status: Abnormal   Collection Time: 03/19/17  4:07 PM  Result Value Ref Range   Lactic Acid, Venous 3.4 (HH) 0.5 - 1.9 mmol/L    Comment: CRITICAL RESULT CALLED TO, READ BACK BY AND VERIFIED WITH JANIE BOWEN ON 03/19/17 AT 1644 [facility]   Urinalysis, Complete w Microscopic     Status: Abnormal   Collection Time: 03/19/17  4:07 PM  Result Value Ref Range   Color, Urine STRAW (A) YELLOW   APPearance CLEAR (A) CLEAR   Specific Gravity, Urine 1.004 (L) 1.005 - 1.030   pH 6.0 5.0 - 8.0   Glucose, UA NEGATIVE NEGATIVE mg/dL   Hgb urine dipstick NEGATIVE NEGATIVE   Bilirubin Urine NEGATIVE NEGATIVE   Ketones, ur NEGATIVE NEGATIVE mg/dL   Protein, ur NEGATIVE NEGATIVE mg/dL   Nitrite NEGATIVE NEGATIVE   Leukocytes, UA NEGATIVE NEGATIVE   RBC / HPF 0-5 0 - 5 RBC/hpf   WBC, UA 0-5 0 - 5 WBC/hpf   Bacteria, UA NONE SEEN NONE SEEN   Squamous Epithelial / LPF NONE SEEN NONE SEEN  Urine Drug  Screen, Qualitative     Status: Abnormal   Collection Time: 03/19/17  4:07 PM  Result Value Ref Range   Tricyclic, Ur Screen NONE DETECTED NONE DETECTED   Amphetamines, Ur Screen NONE  DETECTED NONE DETECTED   MDMA (Ecstasy)Ur Screen NONE DETECTED NONE DETECTED   Cocaine Metabolite,Ur [place] NONE DETECTED NONE DETECTED   Opiate, Ur Screen NONE DETECTED NONE DETECTED   Phencyclidine (PCP) Ur S NONE DETECTED NONE DETECTED   Cannabinoid 50 Ng, Ur [place] NONE DETECTED NONE DETECTED   Barbiturates, Ur Screen NONE DETECTED NONE DETECTED   Benzodiazepine, Ur Scrn POSITIVE (A) NONE DETECTED   Methadone Scn, Ur NONE DETECTED NONE DETECTED    Comment: (NOTE) 671  Tricyclics, urine               Cutoff 1000 ng/mL 200  Amphetamines, urine             Cutoff 1000 ng/mL 300  MDMA (Ecstasy), urine           Cutoff 500 ng/mL 400  Cocaine Metabolite, urine       Cutoff 300 ng/mL 500  Opiate, urine                   Cutoff 300 ng/mL 600  Phencyclidine (PCP), urine      Cutoff 25 ng/mL 700  Cannabinoid, urine              Cutoff 50 ng/mL 800  Barbiturates, urine             Cutoff 200 ng/mL 900  Benzodiazepine, urine           Cutoff 200 ng/mL 1000 Methadone, urine                Cutoff 300 ng/mL 1100 1200 The urine drug screen provides only a preliminary, unconfirmed 1300 analytical test result and should not be used for non-medical 1400 purposes. Clinical consideration and professional judgment should 1500 be applied to any positive drug screen result due to possible 1600 interfering substances. A more specific alternate chemical method 1700 must be used in order to obtain a confirmed analytical result.  1800 Gas chromato graphy / mass spectrometry (GC/MS) is the preferred 1900 confirmatory method.     Current Facility-Administered Medications  Medication Dose Route Frequency Provider Last Rate Last Dose  . gabapentin (NEURONTIN) capsule 600 mg  600 mg Oral TID Merlyn Lot, MD      . LORazepam (ATIVAN) injection 0-4 mg  0-4 mg Intravenous Q6H Merlyn Lot, MD       Or  . LORazepam (ATIVAN) tablet 0-4 mg  0-4 mg Oral Q6H Merlyn Lot, MD   2 mg at 03/20/17 1136  .  [START ON 03/22/2017] LORazepam (ATIVAN) injection 0-4 mg  0-4 mg Intravenous Q12H Merlyn Lot, MD       Or  . Derrill Memo ON 03/22/2017] LORazepam (ATIVAN) tablet 0-4 mg  0-4 mg Oral Q12H Merlyn Lot, MD      . nicotine (NICODERM CQ - dosed in mg/24 hours) patch 14 mg  14 mg Transdermal Once Merlyn Lot, MD   14 mg at 03/20/17 1409  . thiamine (VITAMIN B-1) tablet 100 mg  100 mg Oral Daily Merlyn Lot, MD   100 mg at 03/20/17 1137   Or  . thiamine (B-1) injection 100 mg  100 mg Intravenous Daily Merlyn Lot, MD       Current Outpatient Medications  Medication Sig Dispense Refill  . amiodarone (PACERONE)  200 MG tablet Take 1 tablet (200 mg total) by mouth daily. 30 tablet 2  . carvedilol (COREG) 6.25 MG tablet Take 1 tablet (6.25 mg total) by mouth 2 (two) times daily with a meal. 60 tablet 2  . citalopram (CELEXA) 20 MG tablet Take 1 tablet (20 mg total) by mouth daily. 30 tablet 5  . furosemide (LASIX) 40 MG tablet Take 40 mg by mouth daily as needed for fluid (if taking prednisone).     . gabapentin (NEURONTIN) 300 MG capsule Take 1 capsule (300 mg total) by mouth 3 (three) times daily. (Patient taking differently: Take 600 mg 3 (three) times daily by mouth. ) 90 capsule 0  . lisinopril (PRINIVIL,ZESTRIL) 10 MG tablet Take 1 tablet (10 mg total) by mouth daily. 30 tablet 0  . oxyCODONE (OXY IR/ROXICODONE) 5 MG immediate release tablet Take 1 tablet (5 mg total) by mouth every 8 (eight) hours as needed. 90 tablet 0  . metFORMIN (GLUCOPHAGE) 500 MG tablet Take 1 tablet (500 mg total) by mouth daily. (Patient not taking: Reported on 03/20/2017) 30 tablet 0    Musculoskeletal: Strength & Muscle Tone: decreased Gait & Station: unsteady Patient leans: N/A  Psychiatric Specialty Exam: Physical Exam  Nursing note and vitals reviewed. Constitutional: He appears well-developed and well-nourished.  HENT:  Head: Normocephalic and atraumatic.  Eyes: Conjunctivae are  normal. Pupils are equal, round, and reactive to light.  Neck: Normal range of motion.  Cardiovascular: Regular rhythm and normal heart sounds.  Respiratory: Effort normal. No respiratory distress.  GI: Soft.  Musculoskeletal: Normal range of motion.  Neurological: He is alert.  Skin: Skin is warm and dry.  Psychiatric: Judgment normal. His affect is blunt. His speech is delayed. He is slowed. Thought content is not paranoid. He expresses no homicidal and no suicidal ideation. He exhibits abnormal recent memory.    Review of Systems  Constitutional: Negative.   HENT: Negative.   Eyes: Negative.   Respiratory: Negative.   Cardiovascular: Negative.   Gastrointestinal: Negative.   Musculoskeletal: Negative.   Skin: Negative.   Neurological: Negative.   Psychiatric/Behavioral: Positive for memory loss and substance abuse. Negative for depression, hallucinations and suicidal ideas. The patient is nervous/anxious and has insomnia.     Blood pressure (!) 165/94, pulse 95, temperature 98 F (36.7 C), temperature source Oral, resp. rate 15, weight 81.6 kg (180 lb), SpO2 98 %.Body mass index is 25.83 kg/m.  General Appearance: Disheveled  Eye Contact:  Minimal  Speech:  Slow  Volume:  Decreased  Mood:  Dysphoric  Affect:  Constricted  Thought Process:  Goal Directed  Orientation:  Full (Time, Place, and Person)  Thought Content:  Logical  Suicidal Thoughts:  No  Homicidal Thoughts:  No  Memory:  Immediate;   Fair Recent;   Fair Remote;   Fair  Judgement:  Impaired  Insight:  Shallow  Psychomotor Activity:  Decreased  Concentration:  Concentration: Fair  Recall:  [employer] of Knowledge:  Fair  Language:  Fair  Akathisia:  No  Handed:  Right  AIMS (if indicated):     Assets:  Housing  ADL's:  Impaired  Cognition:  Impaired,  Mild  Sleep:        Treatment Plan Summary: Plan 55 year old man with alcohol abuse.  Patient is currently lucid not delirious not suicidal or  homicidal.  Admits that he has an alcohol problem and says that he plans to try to get involved with outpatient treatment.  He  does not require inpatient psychiatric management.  I would not suggest giving him outpatient detox medicines because of the very high risk that he will mix them with alcohol.  Unfortunately patient is at risk for delirium tremens.  Case reviewed with emergency room physician.  Patient is already hooked up with outpatient mental health and substance abuse treatment.  No further change to treatment plan.  Disposition: Patient does not meet criteria for psychiatric inpatient admission. Supportive therapy provided about ongoing stressors.  Alethia Berthold, MD 03/20/2017 7:53 PM

## 2017-03-20 NOTE — ED Notes (Signed)
2 rings, 1 yellow and 1 metal color, placed in container, labeled, and placed in pt belongings bag.

## 2017-03-20 NOTE — ED Notes (Signed)
Pt ambulated to restroom. Gait unsteady. Gait belt required with 2 person assist. Tremors noted. Pt asking if it is time for him to get more medication.

## 2017-03-20 NOTE — ED Notes (Signed)
Patient up walking to bathroom

## 2017-03-20 NOTE — ED Provider Notes (Signed)
Patient received in sign-out from Dr. Scotty CourtStafford.  Patient observed and now clinically sober. Requesting DC home.     Kurt Horton, Kurt Tuite, MD 03/20/17 [phone]

## 2017-03-21 ENCOUNTER — Other Ambulatory Visit: Payer: Self-pay

## 2017-03-21 ENCOUNTER — Emergency Department
Admission: EM | Admit: 2017-03-21 | Discharge: 2017-03-21 | Disposition: A | Discharge status: Home / Self Care | Payer: Medicaid Other | Attending: Emergency Medicine | Admitting: Emergency Medicine

## 2017-03-21 DIAGNOSIS — F10920 Alcohol use, unspecified with intoxication, uncomplicated: Secondary | ICD-10-CM | POA: Diagnosis present

## 2017-03-21 DIAGNOSIS — E119 Type 2 diabetes mellitus without complications: Secondary | ICD-10-CM | POA: Insufficient documentation

## 2017-03-21 DIAGNOSIS — I10 Essential (primary) hypertension: Secondary | ICD-10-CM | POA: Diagnosis not present

## 2017-03-21 DIAGNOSIS — G8929 Other chronic pain: Secondary | ICD-10-CM | POA: Diagnosis not present

## 2017-03-21 DIAGNOSIS — Z79899 Other long term (current) drug therapy: Secondary | ICD-10-CM | POA: Insufficient documentation

## 2017-03-21 DIAGNOSIS — Y908 Blood alcohol level of 240 mg/100 ml or more: Secondary | ICD-10-CM | POA: Insufficient documentation

## 2017-03-21 LAB — COMPREHENSIVE METABOLIC PANEL
ALK PHOS: 111 U/L (ref 38–126)
ALT: 13 U/L — AB (ref 17–63)
ANION GAP: 16 — AB (ref 5–15)
AST: 27 U/L (ref 15–41)
Albumin: 3.7 g/dL (ref 3.5–5.0)
BILIRUBIN TOTAL: 0.8 mg/dL (ref 0.3–1.2)
BUN: <5 mg/dL — ABNORMAL LOW (ref 6–20)
CALCIUM: 9.1 mg/dL (ref 8.9–10.3)
CO2: 20 mmol/L — ABNORMAL LOW (ref 22–32)
CREATININE: 0.51 mg/dL — AB (ref 0.61–1.24)
Chloride: 103 mmol/L (ref 101–111)
GFR calc non Af Amer: >60 mL/min (ref >60)
Glucose, Bld: 162 mg/dL — ABNORMAL HIGH (ref 65–99)
Potassium: 2.7 mmol/L — CL (ref 3.5–5.1)
Sodium: 139 mmol/L (ref 135–145)
TOTAL PROTEIN: 7.2 g/dL (ref 6.5–8.1)

## 2017-03-21 LAB — CBC
HCT: 36.8 % — ABNORMAL LOW (ref 40.0–52.0)
Hemoglobin: 11.2 g/dL — ABNORMAL LOW (ref 13.0–18.0)
MCH: 21.9 pg — ABNORMAL LOW (ref 26.0–34.0)
MCHC: 30.4 g/dL — ABNORMAL LOW (ref 32.0–36.0)
MCV: 72 fL — AB (ref 80.0–100.0)
PLATELETS: 296 10*3/uL (ref 150–440)
RBC: 5.11 MIL/uL (ref 4.40–5.90)
RDW: 20.6 % — AB (ref 11.5–14.5)
WBC: 6.3 10*3/uL (ref 3.8–10.6)

## 2017-03-21 LAB — URINE DRUG SCREEN, QUALITATIVE (ARMC ONLY)
AMPHETAMINES, UR SCREEN: NOT DETECTED
BENZODIAZEPINE, UR SCRN: POSITIVE — AB
Barbiturates, Ur Screen: NOT DETECTED
Cannabinoid 50 Ng, Ur ~~LOC~~: NOT DETECTED
Cocaine Metabolite,Ur ~~LOC~~: NOT DETECTED
MDMA (Ecstasy)Ur Screen: NOT DETECTED
METHADONE SCREEN, URINE: NOT DETECTED
Opiate, Ur Screen: NOT DETECTED
PHENCYCLIDINE (PCP) UR S: NOT DETECTED
TRICYCLIC, UR SCREEN: NOT DETECTED

## 2017-03-21 LAB — ETHANOL: ALCOHOL ETHYL (B): 369 mg/dL — AB (ref <10)

## 2017-03-21 MED ORDER — NICOTINE 14 MG/24HR TD PT24
MEDICATED_PATCH | TRANSDERMAL | Status: AC
  Administered 2017-03-21: 14 mg
  Filled 2017-03-21: qty 1

## 2017-03-21 MED ORDER — LIDOCAINE 5 % EX PTCH
MEDICATED_PATCH | CUTANEOUS | Status: AC
  Administered 2017-03-21: 1
  Filled 2017-03-21: qty 1

## 2017-03-21 MED ORDER — ONDANSETRON 4 MG PO TBDP
ORAL_TABLET | ORAL | Status: AC
  Administered 2017-03-21: 01:00:00
  Filled 2017-03-21: qty 1

## 2017-03-21 NOTE — ED Notes (Signed)
Pt observed lying in bed - watching TV   Pt visualized with NAD  No verbalized needs or concerns at this time  Continue to monitor 

## 2017-03-21 NOTE — ED Triage Notes (Signed)
Pt to the er for alcohol intoxication and depression. Pt was d/c from this facilty 6 hours ago and has returned for same. Pt states he wants to go to rehab to stop drinking.

## 2017-03-21 NOTE — BH Assessment (Signed)
TTS Consult wasn't completed.  Patient discharged prior to writer seeing patient.

## 2017-03-21 NOTE — ED Notes (Signed)
Pt given a cup of black coffee per request.

## 2017-03-21 NOTE — ED Notes (Signed)
Pt given breakfast tray

## 2017-03-21 NOTE — ED Notes (Signed)
Patient is resting comfortably. 

## 2017-03-21 NOTE — ED Provider Notes (Signed)
[facility] Emergency Department Provider Note    First MD Initiated Contact with Patient 03/21/17 0013     (approximate)  I have reviewed the triage vital signs and the nursing notes.   HISTORY  Chief Complaint Alcohol Intoxication    HPI Kurt PhenesJohn D Horton is a 56 y.o. male with low-dose of chronic medical conditions presents to the emergency department via EMS with acute alcohol intoxication. Patient was discharged from [facility] 6 hours ago for the same. Patient states that he wants to go to rehabilitation to for alcohol detox. Patient denies any suicidal or homicidal ideation.   Past Medical History:  Diagnosis Date  . Alcohol abuse   . Alcohol withdrawal (HCC) 11/12/2016  . Diabetes mellitus without complication (HCC)   . DJD (degenerative joint disease)   . Hypertension   . Myasthenia gravis (HCC)   . Renal disorder     Patient Active Problem List   Diagnosis Date Noted  . Alcohol withdrawal delirium (HCC) 02/26/2017  . Atrial fibrillation with RVR (HCC) 02/25/2017  . Acute alcoholic intoxication with complication (HCC)   . Alcohol abuse   . Alcohol withdrawal (HCC) 11/13/2016  . Substance induced mood disorder (HCC) 11/13/2016  . SVT (supraventricular tachycardia) (HCC) 11/12/2016  . HTN (hypertension) 11/12/2016  . EtOH dependence (HCC) 11/12/2016  . Lumbar radiculopathy 08/24/2016  . Depression with anxiety 08/24/2016  . Chronic low back pain 01/04/2016  . Pressure ulcer 12/29/2015  . Osteomyelitis (HCC) 12/26/2015  . PVD (peripheral vascular disease) (HCC) 12/26/2015  . Type II diabetes mellitus with manifestations (HCC) 12/26/2015  . Myasthenia gravis (HCC) 12/26/2015    History reviewed. No pertinent surgical history.  Prior to Admission medications   Medication Sig Start Date End Date Taking? Authorizing Provider  amiodarone (PACERONE) 200 MG tablet Take 1 tablet (200 mg total) by mouth daily. 03/02/17  Yes  Enid BaasKalisetti, Radhika, MD  carvedilol (COREG) 6.25 MG tablet Take 1 tablet (6.25 mg total) by mouth 2 (two) times daily with a meal. 03/01/17  Yes Enid BaasKalisetti, Radhika, MD  citalopram (CELEXA) 20 MG tablet Take 1 tablet (20 mg total) by mouth daily. 08/24/16  Yes Sater, Pearletha Furlichard A, MD  furosemide (LASIX) 40 MG tablet Take 40 mg by mouth daily as needed for fluid (if taking prednisone).    Yes [provider]  gabapentin (NEURONTIN) 300 MG capsule Take 1 capsule (300 mg total) by mouth 3 (three) times daily. Patient taking differently: Take 600 mg 3 (three) times daily by mouth.  03/01/17  Yes Enid BaasKalisetti, Radhika, MD  lisinopril (PRINIVIL,ZESTRIL) 10 MG tablet Take 1 tablet (10 mg total) by mouth daily. 12/30/15  Yes Wieting, Richard, MD  oxyCODONE (OXY IR/ROXICODONE) 5 MG immediate release tablet Take 1 tablet (5 mg total) by mouth every 8 (eight) hours as needed. 10/11/16  Yes Sater, Pearletha Furlichard A, MD  metFORMIN (GLUCOPHAGE) 500 MG tablet Take 1 tablet (500 mg total) by mouth daily. Patient not taking: Reported on 03/20/2017 12/30/15   Alford HighlandWieting, Richard, MD    Allergies Other  Family History  Problem Relation Age of Onset  . Rheum arthritis Father     Social History Social History   Tobacco Use  . Smoking status: Never Smoker  . Smokeless tobacco: Never Used  Substance Use Topics  . Alcohol use: Yes    Comment: Pt had tequila and rum, at least a 5th  . Drug use: No    Review of Systems Constitutional: No fever/chills Eyes: No visual  changes. ENT: No sore throat. Cardiovascular: Denies chest pain. Respiratory: Denies shortness of breath. Gastrointestinal: No abdominal pain.  No nausea, no vomiting.  No diarrhea.  No constipation. Genitourinary: Negative for dysuria. Musculoskeletal: Negative for neck pain.  Negative for back pain. Integumentary: Negative for rash. Neurological: Negative for headaches, focal weakness or numbness. Psychiatric:Positive for alcohol  intoxication   ____________________________________________   PHYSICAL EXAM:  VITAL SIGNS: ED Triage Vitals  Enc Vitals Group     BP 03/21/17 0032 123/69     Pulse Rate 03/21/17 0032 98     Resp 03/21/17 0032 14     Temp 03/21/17 0032 97.8 F (36.6 C)     Temp Source 03/21/17 0032 Oral     SpO2 03/21/17 0032 98 %     Weight 03/21/17 0033 81.6 kg (180 lb)     Height 03/21/17 0033 1.778 m (5\' 10" )     Head Circumference --      Peak Flow --      Pain Score 03/21/17 0031 8     Pain Loc --      Pain Edu? --      Excl. in GC? --     Constitutional: Alert and oriented. Well appearing and in no acute distress. Eyes: Conjunctivae are normal.  Head: Atraumatic. Mouth/Throat: Mucous membranes are moist. Oropharynx non-erythematous. Neck: No stridor.   Cardiovascular: Normal rate, regular rhythm. Good peripheral circulation. Grossly normal heart sounds. Respiratory: Normal respiratory effort.  No retractions. Lungs CTAB. Gastrointestinal: Soft and nontender. No distention.  Musculoskeletal: No lower extremity tenderness nor edema. No gross deformities of extremities. Neurologic:  Normal speech and language. No gross focal neurologic deficits are appreciated.  Skin:  Skin is warm, dry and intact. No rash noted. Psychiatric: Mood and affect are normal. Speech and behavior are normal.  ____________________________________________   LABS (all labs ordered are listed, but only abnormal results are displayed)  Labs Reviewed  COMPREHENSIVE METABOLIC PANEL - Abnormal; Notable for the following components:      Result Value   Potassium 2.7 (*)    CO2 20 (*)    Glucose, Bld 162 (*)    BUN <5 (*)    Creatinine, Ser 0.51 (*)    ALT 13 (*)    Anion gap 16 (*)    All other components within normal limits  ETHANOL - Abnormal; Notable for the following components:   Alcohol, Ethyl (B) 369 (*)    All other components within normal limits  CBC - Abnormal; Notable for the following  components:   Hemoglobin 11.2 (*)    HCT 36.8 (*)    MCV 72.0 (*)    MCH 21.9 (*)    MCHC 30.4 (*)    RDW 20.6 (*)    All other components within normal limits  URINE DRUG SCREEN, QUALITATIVE ([facility] ONLY) - Abnormal; Notable for the following components:   Benzodiazepine, Ur Scrn POSITIVE (*)    All other components within normal limits     Procedures   ____________________________________________   INITIAL IMPRESSION / ASSESSMENT AND PLAN / ED COURSE  As part of my medical decision making, I reviewed the following data within the electronic MEDICAL RECORD NUMBER 56 year old male presenting with alcohol intoxication requesting alcohol detox at this time. Patient alcohol level 369 ____________________________________________  FINAL CLINICAL IMPRESSION(S) / ED DIAGNOSES  Final diagnoses:  Alcoholic intoxication without complication (HCC)     MEDICATIONS GIVEN DURING THIS VISIT:  Medications  ondansetron (ZOFRAN-ODT) 4 MG disintegrating tablet (  Given 03/21/17 0045)  lidocaine (LIDODERM) 5 % (1 patch  Patch Applied 03/21/17 0431)  nicotine (NICODERM CQ - dosed in mg/24 hours) 14 mg/24hr patch (14 mg  Patch Applied 03/21/17 0557)     ED Discharge Orders    None       Note:  This document was prepared using Dragon voice recognition software and may include unintentional dictation errors.    Darci Current, MD 03/21/17 [phone]

## 2017-03-21 NOTE — ED Notes (Signed)
BEHAVIORAL HEALTH ROUNDING Patient sleeping: No. Patient alert and oriented: yes Behavior appropriate: Yes.  ; If no, describe:  Nutrition and fluids offered: yes Toileting and hygiene offered: Yes  Sitter present: q15 minute observations and security  monitoring Law enforcement present: Yes  ODS  

## 2017-03-21 NOTE — ED Notes (Signed)
Pt c/o pain to the lower back due to a previous injury. Asked what pt took for pain and pt stated that he took oxy before. Explained to pt that we could not give him oxy or tylenol due to his ETOH intake. Pt states he cant take motrin due to ulcers in his stomach. Pt asking about detox. Pt is more coherent now. Asked if pt had tried a pain clinic and pt stated he had not been able to do anything since his divorce.

## 2017-03-21 NOTE — ED Notes (Signed)
Pt asking for pain medic ation. Pt says he cant take ibuprofen due to ulcers. Explained to pt that he would not receive any narctoic or opioid pain meds. He cant take tylenol with his ETOH due because it would damage his liver. He can take ibuprofen for pain. Pt states he was told that he has ulcers due to ibuprofen use. Explained to pt that ETOH use is also detrimental to ulcers as well so the IBU should be fine. Pt asked what we could do for him and I told him we can get him sober. Told pt he needed to go to rehab and pain clinic. Spoke with CSW and he can call [facility] this morning to see if any discharges available.

## 2017-03-21 NOTE — ED Notes (Signed)
ED Is the patient under IVC or is there intent for IVC: no Is the patient medically cleared:  Yes  - pt to be discharged  Is there vacancy in the ED BHU:  Is the population mix appropriate for patient:  Is the patient awaiting placement in inpatient or outpatient setting:  Has the patient had a psychiatric consult:   Survey of unit performed for contraband, proper placement and condition of furniture, tampering with fixtures in bathroom, shower, and each patient room: Yes.  ; Findings:  APPEARANCE/BEHAVIOR Calm and cooperative NEURO ASSESSMENT Orientation: oriented x4   Hallucinations: No.None noted (Hallucinations) denies Speech: Normal Gait: normal RESPIRATORY ASSESSMENT Even  Unlabored respirations  CARDIOVASCULAR ASSESSMENT Pulses equal   regular rate  Skin warm and dry   GASTROINTESTINAL ASSESSMENT no GI complaint EXTREMITIES Full ROM  PLAN OF CARE Provide calm/safe environment. Vital signs assessed twice daily. ED BHU Assessment once each 12-hour shift. Collaborate with [facility]TS daily or as condition indicates. Assure the ED provider has rounded once each shift. Provide and encourage hygiene. Provide redirection as needed. Assess for escalating behavior; address immediately and inform ED provider.  Assess family dynamic and appropriateness for visitation as needed: Yes.  ; If necessary, describe findings:  Educate the patient/family about BHU procedures/visitation: Yes.  ; If necessary, describe findings:

## 2017-03-21 NOTE — ED Notes (Signed)

## 2017-06-03 ENCOUNTER — Inpatient Hospital Stay
Admission: EM | Admit: 2017-06-03 | Discharge: 2017-06-06 | DRG: 897 | Disposition: A | Discharge status: Home / Self Care | Payer: Medicaid Other | Attending: Internal Medicine | Admitting: Internal Medicine

## 2017-06-03 ENCOUNTER — Encounter: Payer: Self-pay | Admitting: Emergency Medicine

## 2017-06-03 ENCOUNTER — Other Ambulatory Visit: Payer: Self-pay

## 2017-06-03 DIAGNOSIS — F17210 Nicotine dependence, cigarettes, uncomplicated: Secondary | ICD-10-CM | POA: Diagnosis present

## 2017-06-03 DIAGNOSIS — M5416 Radiculopathy, lumbar region: Secondary | ICD-10-CM | POA: Diagnosis present

## 2017-06-03 DIAGNOSIS — D638 Anemia in other chronic diseases classified elsewhere: Secondary | ICD-10-CM | POA: Diagnosis present

## 2017-06-03 DIAGNOSIS — I10 Essential (primary) hypertension: Secondary | ICD-10-CM | POA: Diagnosis present

## 2017-06-03 DIAGNOSIS — M1990 Unspecified osteoarthritis, unspecified site: Secondary | ICD-10-CM | POA: Diagnosis present

## 2017-06-03 DIAGNOSIS — F10239 Alcohol dependence with withdrawal, unspecified: Secondary | ICD-10-CM | POA: Diagnosis present

## 2017-06-03 DIAGNOSIS — Z89422 Acquired absence of other left toe(s): Secondary | ICD-10-CM

## 2017-06-03 DIAGNOSIS — M549 Dorsalgia, unspecified: Secondary | ICD-10-CM | POA: Diagnosis present

## 2017-06-03 DIAGNOSIS — Z7984 Long term (current) use of oral hypoglycemic drugs: Secondary | ICD-10-CM | POA: Diagnosis not present

## 2017-06-03 DIAGNOSIS — E119 Type 2 diabetes mellitus without complications: Secondary | ICD-10-CM | POA: Diagnosis present

## 2017-06-03 DIAGNOSIS — F10939 Alcohol use, unspecified with withdrawal, unspecified: Secondary | ICD-10-CM | POA: Diagnosis present

## 2017-06-03 DIAGNOSIS — G8929 Other chronic pain: Secondary | ICD-10-CM | POA: Diagnosis present

## 2017-06-03 DIAGNOSIS — G7000 Myasthenia gravis without (acute) exacerbation: Secondary | ICD-10-CM | POA: Diagnosis present

## 2017-06-03 DIAGNOSIS — E118 Type 2 diabetes mellitus with unspecified complications: Secondary | ICD-10-CM | POA: Diagnosis present

## 2017-06-03 DIAGNOSIS — F1020 Alcohol dependence, uncomplicated: Secondary | ICD-10-CM | POA: Diagnosis present

## 2017-06-03 DIAGNOSIS — F10230 Alcohol dependence with withdrawal, uncomplicated: Secondary | ICD-10-CM | POA: Diagnosis not present

## 2017-06-03 MED ORDER — CARVEDILOL 3.125 MG PO TABS
6.2500 mg | ORAL_TABLET | Freq: Two times a day (BID) | ORAL | Status: DC
  Administered 2017-06-03 – 2017-06-06 (×6): 6.25 mg via ORAL
  Filled 2017-06-03 (×3): qty 2
  Filled 2017-06-03: qty 1
  Filled 2017-06-03 (×2): qty 2

## 2017-06-03 MED ORDER — SODIUM CHLORIDE 0.9 % IV BOLUS (SEPSIS)
1000.0000 mL | Freq: Once | INTRAVENOUS | Status: AC
  Administered 2017-06-03: 1000 mL via INTRAVENOUS

## 2017-06-03 MED ORDER — LORAZEPAM 2 MG/ML IJ SOLN
1.0000 mg | Freq: Once | INTRAMUSCULAR | Status: AC
  Administered 2017-06-03: 1 mg via INTRAVENOUS
  Filled 2017-06-03: qty 1

## 2017-06-03 MED ORDER — LORAZEPAM 2 MG/ML IJ SOLN
2.0000 mg | Freq: Once | INTRAMUSCULAR | Status: AC
  Administered 2017-06-03: 2 mg via INTRAVENOUS
  Filled 2017-06-03: qty 1

## 2017-06-03 NOTE — ED Triage Notes (Addendum)
Pt reports he's going through alcohol withdrawal; usually drinks 1/5th per day;  Has not had any alcohol since sometime this am; pt just flew back from [place] today after being gone for about 6 weeks; pt did not take any of his regular medications with him; pt scored 12 on CIWA scale; pt calm and cooperative in triage

## 2017-06-03 NOTE — ED Notes (Signed)
ED Provider at bedside. 

## 2017-06-03 NOTE — ED Notes (Addendum)
Pt arrived with dressing wrapped around left AC; pt says he had blood drawn somewhere, possibly [place], in the last few days and isn't sure why it was never removed; dressing removed at this time-skin red and irritated, pt says site sore;

## 2017-06-03 NOTE — ED Provider Notes (Signed)
[facility] Emergency Department Provider Note   ____________________________________________   I have reviewed the triage vital signs and the nursing notes.   HISTORY  Chief Complaint Withdrawal   History limited by: Not Limited   HPI Kurt Horton is a 57 y.o. male who presents to the emergency department today because of concern for alcohol withdrawal. The patient states that he has been having diarrhea and tremors. States that he has a long history of alcohol abuse and complicated withdrawals. Last drink was 7 am this morning before a day of travel back from [place]. He states he is interested in detox and has been looking into facilities in [place]. He denies any hallucinations.    Per medical record review patient has a history of frequent visits for alcohol related issues, and has required ICU admission in the past for withdrawal symptoms.  Past Medical History:  Diagnosis Date  . Alcohol abuse   . Alcohol withdrawal (HCC) 11/12/2016  . Diabetes mellitus without complication (HCC)   . DJD (degenerative joint disease)   . Hypertension   . Myasthenia gravis (HCC)   . Renal disorder     Patient Active Problem List   Diagnosis Date Noted  . Alcohol withdrawal delirium (HCC) 02/26/2017  . Atrial fibrillation with RVR (HCC) 02/25/2017  . Acute alcoholic intoxication with complication (HCC)   . Alcohol abuse   . Alcohol withdrawal (HCC) 11/13/2016  . Substance induced mood disorder (HCC) 11/13/2016  . SVT (supraventricular tachycardia) (HCC) 11/12/2016  . HTN (hypertension) 11/12/2016  . EtOH dependence (HCC) 11/12/2016  . Lumbar radiculopathy 08/24/2016  . Depression with anxiety 08/24/2016  . Chronic low back pain 01/04/2016  . Pressure ulcer 12/29/2015  . Osteomyelitis (HCC) 12/26/2015  . PVD (peripheral vascular disease) (HCC) 12/26/2015  . Type II diabetes mellitus with manifestations (HCC) 12/26/2015  . Myasthenia gravis (HCC)  12/26/2015    Past Surgical History:  Procedure Laterality Date  . AMPUTATION TOE Left 12/29/2015   Procedure: AMPUTATION TOE;  Surgeon: Gwyneth RevelsJustin Fowler, DPM;  Location: [facility] ORS;  Service: Podiatry;  Laterality: Left;  . PERIPHERAL VASCULAR CATHETERIZATION N/A 12/27/2015   Procedure: Lower Extremity Angiography;  Surgeon: Annice NeedyJason S Dew, MD;  Location: [facility] INVASIVE CV LAB;  Service: Cardiovascular;  Laterality: N/A;    Prior to Admission medications   Medication Sig Start Date End Date Taking? Authorizing Provider  carvedilol (COREG) 6.25 MG tablet Take 1 tablet (6.25 mg total) by mouth 2 (two) times daily with a meal. 03/01/17  Yes Enid BaasKalisetti, Radhika, MD  citalopram (CELEXA) 20 MG tablet Take 1 tablet (20 mg total) by mouth daily. 08/24/16  Yes Sater, Pearletha Furlichard A, MD  furosemide (LASIX) 40 MG tablet Take 40 mg by mouth daily as needed for fluid (if taking prednisone).    Yes [provider]  gabapentin (NEURONTIN) 300 MG capsule Take 1 capsule (300 mg total) by mouth 3 (three) times daily. Patient taking differently: Take 600 mg 3 (three) times daily by mouth.  03/01/17  Yes Enid BaasKalisetti, Radhika, MD  lisinopril (PRINIVIL,ZESTRIL) 10 MG tablet Take 1 tablet (10 mg total) by mouth daily. 12/30/15  Yes Wieting, Richard, MD  metFORMIN (GLUCOPHAGE) 500 MG tablet Take 1 tablet (500 mg total) by mouth daily. 12/30/15  Yes Alford HighlandWieting, Richard, MD    Allergies Other  Family History  Problem Relation Age of Onset  . Rheum arthritis Father     Social History Social History   Tobacco Use  . Smoking status: Current Every  Day Smoker    Packs/day: 0.50    Types: Cigarettes  . Smokeless tobacco: Never Used  Substance Use Topics  . Alcohol use: Yes    Comment: Pt had tequila and rum, at least a 5th  . Drug use: No    Review of Systems Constitutional: No fever/chills Eyes: No visual changes. ENT: No sore throat. Cardiovascular: Denies chest pain. Respiratory: Denies shortness of  breath. Gastrointestinal: Positive for diarrhea. Genitourinary: Negative for dysuria. Musculoskeletal: Negative for back pain. Skin: Negative for rash. Neurological: Positive for tremors.  ____________________________________________   PHYSICAL EXAM:  VITAL SIGNS: ED Triage Vitals [06/03/17 2005]  Enc Vitals Group     BP (!) 162/93     Pulse Rate (!) 126     Resp      Temp 99.3 F (37.4 C)     Temp Source Oral     SpO2 99 %     Weight 195 lb (88.5 kg)     Height 5\' 10"  (1.778 m)     Head Circumference      Peak Flow      Pain Score 8   Constitutional: Alert and oriented. Anxious, tremors.  Eyes: Conjunctivae are normal.  ENT   Head: Normocephalic and atraumatic.   Nose: No congestion/rhinnorhea.   Mouth/Throat: Mucous membranes are moist.   Neck: No stridor. Hematological/Lymphatic/Immunilogical: No cervical lymphadenopathy. Cardiovascular: Tachycardic, regular rhythm.  No murmurs, rubs, or gallops. Respiratory: Normal respiratory effort without tachypnea nor retractions. Breath sounds are clear and equal bilaterally. No wheezes/rales/rhonchi. Gastrointestinal: Soft and non tender. No rebound. No guarding.  Genitourinary: Deferred Musculoskeletal: Normal range of motion in all extremities. No lower extremity edema. Neurologic:  Normal speech and language. Moving all extremities. Diffuse intermittent tremors.  Skin:  Skin is warm, dry and intact. No rash noted. Psychiatric: Anxious  ____________________________________________    LABS (pertinent positives/negatives)  BMP glu 103, cr 0.65, anion gap 9 CBC wbc 8.0, hgb 9.9, plt 347  ____________________________________________   EKG  I, Phineas Semen, attending physician, personally viewed and interpreted this EKG  EKG Time: 2034 Rate: 127 Rhythm: sinus tachycardia Axis: left axis deviation Intervals: qtc 470 QRS: narrow ST changes: no st elevation Impression: abnormal  ekg   ____________________________________________    RADIOLOGY  None  ____________________________________________   PROCEDURES  Procedures  ____________________________________________   INITIAL IMPRESSION / ASSESSMENT AND PLAN / ED COURSE  Pertinent labs & imaging results that were available during my care of the patient were reviewed by me and considered in my medical decision making (see chart for details).  Patient presented to the emergency department today because of concern for alcohol withdrawal. Patient does appear to have some withdrawal symptoms. Has history of significant withdrawal requiring precedex. Given concern for worsening withdrawal will plan on admission to the hospitalist service. Discussed plan with patient.   ____________________________________________   FINAL CLINICAL IMPRESSION(S) / ED DIAGNOSES  Final diagnoses:  Alcohol withdrawal syndrome with complication [facility])     Note: This dictation was prepared with Dragon dictation. Any transcriptional errors that result from this process are unintentional     Phineas Semen, MD 06/04/17 [phone]

## 2017-06-03 NOTE — H&P (Signed)
[facility] - [place] at [facility]   PATIENT NAME: Kurt Horton    MR#:  [id number]  DATE OF BIRTH:  04-Mar-1961  DATE OF ADMISSION:  06/03/2017  PRIMARY CARE PHYSICIAN: Lamar Blinks, MD   REQUESTING/REFERRING PHYSICIAN: Derrill Kay, MD  CHIEF COMPLAINT:   Chief Complaint  Patient presents with  . Withdrawal    HISTORY OF PRESENT ILLNESS:  Kurt Horton  is a 57 y.o. male who presents with tachycardia, tremor, patient states he is in withdrawal.  Patient has been in withdrawal in the past requiring hospitalization.  He states that he is returning from being out of town for some time, and that he has not had anything to drink since earlier this morning but today he feels like he has gone into withdrawal.  Workup in the ED is consistent with the same, hospitalist were called for admission.  PAST MEDICAL HISTORY:   Past Medical History:  Diagnosis Date  . Alcohol abuse   . Alcohol withdrawal (HCC) 11/12/2016  . Diabetes mellitus without complication (HCC)   . DJD (degenerative joint disease)   . Hypertension   . Myasthenia gravis (HCC)   . Renal disorder     PAST SURGICAL HISTORY:   Past Surgical History:  Procedure Laterality Date  . AMPUTATION TOE Left 12/29/2015   Procedure: AMPUTATION TOE;  Surgeon: Gwyneth Revels, DPM;  Location: [facility] ORS;  Service: Podiatry;  Laterality: Left;  . PERIPHERAL VASCULAR CATHETERIZATION N/A 12/27/2015   Procedure: Lower Extremity Angiography;  Surgeon: Annice Needy, MD;  Location: [facility] INVASIVE CV LAB;  Service: Cardiovascular;  Laterality: N/A;    SOCIAL HISTORY:   Social History   Tobacco Use  . Smoking status: Current Every Day Smoker    Packs/day: 0.50    Types: Cigarettes  . Smokeless tobacco: Never Used  Substance Use Topics  . Alcohol use: Yes    Comment: Pt had tequila and rum, at least a 5th    FAMILY HISTORY:   Family History  Problem Relation Age of Onset  . Rheum arthritis Father     DRUG  ALLERGIES:   Allergies  Allergen Reactions  . Other Other (See Comments)    Patient states he can't take any "mycin" medications because myasthenia gravis.    MEDICATIONS AT HOME:   Prior to Admission medications   Medication Sig Start Date End Date Taking? Authorizing Provider  carvedilol (COREG) 6.25 MG tablet Take 1 tablet (6.25 mg total) by mouth 2 (two) times daily with a meal. 03/01/17  Yes Enid Baas, MD  citalopram (CELEXA) 20 MG tablet Take 1 tablet (20 mg total) by mouth daily. 08/24/16  Yes Sater, Pearletha Furl, MD  furosemide (LASIX) 40 MG tablet Take 40 mg by mouth daily as needed for fluid (if taking prednisone).    Yes [provider]  gabapentin (NEURONTIN) 300 MG capsule Take 1 capsule (300 mg total) by mouth 3 (three) times daily. Patient taking differently: Take 600 mg 3 (three) times daily by mouth.  03/01/17  Yes Enid Baas, MD  lisinopril (PRINIVIL,ZESTRIL) 10 MG tablet Take 1 tablet (10 mg total) by mouth daily. 12/30/15  Yes Wieting, Richard, MD  metFORMIN (GLUCOPHAGE) 500 MG tablet Take 1 tablet (500 mg total) by mouth daily. 12/30/15  Yes Alford Highland, MD    REVIEW OF SYSTEMS:  Review of Systems  Constitutional: Positive for malaise/fatigue. Negative for chills, fever and weight loss.  HENT: Negative for ear pain, hearing loss and tinnitus.  Eyes: Negative for blurred vision, double vision, pain and redness.  Respiratory: Negative for cough, hemoptysis and shortness of breath.   Cardiovascular: Negative for chest pain, palpitations, orthopnea and leg swelling.  Gastrointestinal: Negative for abdominal pain, constipation, diarrhea, nausea and vomiting.  Genitourinary: Negative for dysuria, frequency and hematuria.  Musculoskeletal: Negative for back pain, joint pain and neck pain.  Skin:       No acne, rash, or lesions  Neurological: Positive for tremors. Negative for dizziness, focal weakness and weakness.  Endo/Heme/Allergies:  Negative for polydipsia. Does not bruise/bleed easily.  Psychiatric/Behavioral: Negative for depression. The patient is not nervous/anxious and does not have insomnia.      VITAL SIGNS:   Vitals:   06/03/17 2005 06/03/17 2024 06/03/17 2215 06/03/17 2222  BP: (!) 162/93 (!) 162/93 (!) 166/100 (!) 172/104  Pulse: (!) 126 (!) 126 (!) 130 (!) 130  Resp:    17  Temp: 99.3 F (37.4 C)     TempSrc: Oral     SpO2: 99%   99%  Weight: 88.5 kg (195 lb)     Height: 5\' 10"  (1.778 m)      Wt Readings from Last 3 Encounters:  06/03/17 88.5 kg (195 lb)  03/21/17 81.6 kg (180 lb)  03/19/17 81.6 kg (180 lb)    PHYSICAL EXAMINATION:  Physical Exam  Vitals reviewed. Constitutional: He is oriented to person, place, and time. He appears well-developed and well-nourished. No distress.  HENT:  Head: Normocephalic and atraumatic.  Mouth/Throat: Oropharynx is clear and moist.  Eyes: Conjunctivae and EOM are normal. Pupils are equal, round, and reactive to light. No scleral icterus.  Neck: Normal range of motion. Neck supple. No JVD present. No thyromegaly present.  Cardiovascular: Regular rhythm and intact distal pulses. Exam reveals no gallop and no friction rub.  No murmur heard. Tachycardic  Respiratory: Effort normal and breath sounds normal. No respiratory distress. He has no wheezes. He has no rales.  GI: Soft. Bowel sounds are normal. He exhibits no distension. There is no tenderness.  Musculoskeletal: Normal range of motion. He exhibits no edema.  No arthritis, no gout  Lymphadenopathy:    He has no cervical adenopathy.  Neurological: He is alert and oriented to person, place, and time. No cranial nerve deficit.  No dysarthria, no aphasia  Skin: Skin is warm and dry. No rash noted. No erythema.  Psychiatric:  Tremor, patient is anxious appearing    LABORATORY PANEL:   CBC No results for input(s): WBC, HGB, HCT, PLT in the last 168  hours. ------------------------------------------------------------------------------------------------------------------  Chemistries  No results for input(s): NA, K, CL, CO2, GLUCOSE, BUN, CREATININE, CALCIUM, MG, AST, ALT, ALKPHOS, BILITOT in the last 168 hours.  Invalid input(s): GFRCGP ------------------------------------------------------------------------------------------------------------------  Cardiac Enzymes No results for input(s): TROPONINI in the last 168 hours. ------------------------------------------------------------------------------------------------------------------  RADIOLOGY:  No results found.  EKG:   Orders placed or performed during the hospital encounter of 06/03/17  . EKG 12-Lead  . EKG 12-Lead    IMPRESSION AND PLAN:  Principal Problem:   Alcohol withdrawal (HCC) -CIWA protocol, Librium, patient is interested in looking for a longer term detox when he is ready for discharge Active Problems:   Type II diabetes mellitus with manifestations (HCC) -findings scale insulin with corresponding glucose checks   HTN (hypertension) -continue home meds  All the records are reviewed and case discussed with ED provider. Management plans discussed with the patient and/or family.  DVT PROPHYLAXIS: SubQ lovenox  GI PROPHYLAXIS: None  ADMISSION STATUS: Inpatient  CODE STATUS: Full Code Status History    Date Active Date Inactive Code Status Order ID Comments User Context   03/19/2017 15:59 03/20/2017 21:28 Full Code [id number]  Arnaldo Natal, MD ED   02/25/2017 14:21 03/01/2017 18:29 Full Code [id number]  Houston Siren, MD Inpatient   11/12/2016 15:33 11/14/2016 14:25 Full Code [id number]  Marguarite Arbour, MD Inpatient   12/26/2015 19:27 12/30/2015 16:04 Full Code [id number]  Marguarite Arbour, MD Inpatient      TOTAL TIME TAKING CARE OF THIS PATIENT: 45 minutes.   Carvell Hoeffner FIELDING 06/03/2017, 11:08 PM  [employer]   [phone]  CC: Primary care physician; Lamar Blinks, MD  Note:  This document was prepared using Dragon voice recognition software and may include unintentional dictation errors.

## 2017-06-04 ENCOUNTER — Other Ambulatory Visit: Payer: Self-pay

## 2017-06-04 DIAGNOSIS — F10230 Alcohol dependence with withdrawal, uncomplicated: Secondary | ICD-10-CM

## 2017-06-04 LAB — BASIC METABOLIC PANEL
ANION GAP: 9 (ref 5–15)
ANION GAP: 9 (ref 5–15)
BUN: 5 mg/dL — ABNORMAL LOW (ref 6–20)
BUN: 7 mg/dL (ref 6–20)
CHLORIDE: 103 mmol/L (ref 101–111)
CHLORIDE: 103 mmol/L (ref 101–111)
CO2: 25 mmol/L (ref 22–32)
CO2: 25 mmol/L (ref 22–32)
CREATININE: 0.66 mg/dL (ref 0.61–1.24)
Calcium: 8.5 mg/dL — ABNORMAL LOW (ref 8.9–10.3)
Calcium: 8.6 mg/dL — ABNORMAL LOW (ref 8.9–10.3)
Creatinine, Ser: 0.65 mg/dL (ref 0.61–1.24)
GFR calc Af Amer: >60 mL/min (ref >60)
GFR calc non Af Amer: >60 mL/min (ref >60)
GFR calc non Af Amer: >60 mL/min (ref >60)
Glucose, Bld: 103 mg/dL — ABNORMAL HIGH (ref 65–99)
Glucose, Bld: 118 mg/dL — ABNORMAL HIGH (ref 65–99)
POTASSIUM: 3.5 mmol/L (ref 3.5–5.1)
Potassium: 3.7 mmol/L (ref 3.5–5.1)
Sodium: 137 mmol/L (ref 135–145)
Sodium: 137 mmol/L (ref 135–145)

## 2017-06-04 LAB — CBC WITH DIFFERENTIAL/PLATELET
BASOS ABS: 0.1 10*3/uL (ref 0–0.1)
BASOS PCT: 1 %
Basophils Absolute: 0.1 10*3/uL (ref 0–0.1)
Basophils Relative: 2 %
Eosinophils Absolute: 0 10*3/uL (ref 0–0.7)
Eosinophils Absolute: 0.1 10*3/uL (ref 0–0.7)
Eosinophils Relative: 0 %
Eosinophils Relative: 2 %
HCT: 31.4 % — ABNORMAL LOW (ref 40.0–52.0)
HEMATOCRIT: 31.3 % — AB (ref 40.0–52.0)
HEMOGLOBIN: 9.9 g/dL — AB (ref 13.0–18.0)
Hemoglobin: 9.9 g/dL — ABNORMAL LOW (ref 13.0–18.0)
LYMPHS ABS: 1.5 10*3/uL (ref 1.0–3.6)
Lymphocytes Relative: 22 %
Lymphocytes Relative: 23 %
Lymphs Abs: 1.8 10*3/uL (ref 1.0–3.6)
MCH: 24.6 pg — ABNORMAL LOW (ref 26.0–34.0)
MCH: 25 pg — AB (ref 26.0–34.0)
MCHC: 31.5 g/dL — ABNORMAL LOW (ref 32.0–36.0)
MCHC: 31.7 g/dL — ABNORMAL LOW (ref 32.0–36.0)
MCV: 78.1 fL — ABNORMAL LOW (ref 80.0–100.0)
MCV: 78.9 fL — AB (ref 80.0–100.0)
MONOS PCT: 17 %
Monocytes Absolute: 1.1 10*3/uL — ABNORMAL HIGH (ref 0.2–1.0)
Monocytes Absolute: 1.1 10*3/uL — ABNORMAL HIGH (ref 0.2–1.0)
Monocytes Relative: 14 %
NEUTROS ABS: 3.6 10*3/uL (ref 1.4–6.5)
NEUTROS ABS: 5 10*3/uL (ref 1.4–6.5)
NEUTROS PCT: 56 %
NEUTROS PCT: 63 %
Platelets: 329 10*3/uL (ref 150–440)
Platelets: 347 10*3/uL (ref 150–440)
RBC: 3.96 MIL/uL — ABNORMAL LOW (ref 4.40–5.90)
RBC: 4.02 MIL/uL — AB (ref 4.40–5.90)
RDW: 23.4 % — ABNORMAL HIGH (ref 11.5–14.5)
RDW: 24 % — ABNORMAL HIGH (ref 11.5–14.5)
WBC: 6.3 10*3/uL (ref 3.8–10.6)
WBC: 8 10*3/uL (ref 3.8–10.6)

## 2017-06-04 LAB — GLUCOSE, CAPILLARY
Glucose-Capillary: 109 mg/dL — ABNORMAL HIGH (ref 65–99)
Glucose-Capillary: 106 mg/dL — ABNORMAL HIGH (ref 65–99)
Glucose-Capillary: 121 mg/dL — ABNORMAL HIGH (ref 65–99)
Glucose-Capillary: 128 mg/dL — ABNORMAL HIGH (ref 65–99)
Glucose-Capillary: 116 mg/dL — ABNORMAL HIGH (ref 65–99)

## 2017-06-04 MED ORDER — INSULIN ASPART 100 UNIT/ML ~~LOC~~ SOLN
0.0000 [IU] | Freq: Three times a day (TID) | SUBCUTANEOUS | Status: DC
  Administered 2017-06-04 – 2017-06-06 (×3): 1 [IU] via SUBCUTANEOUS
  Filled 2017-06-04 (×3): qty 1

## 2017-06-04 MED ORDER — SODIUM CHLORIDE 0.9 % IV SOLN
INTRAVENOUS | Status: DC
  Administered 2017-06-04: 02:00:00 via INTRAVENOUS

## 2017-06-04 MED ORDER — VITAMIN B-1 100 MG PO TABS
100.0000 mg | ORAL_TABLET | Freq: Every day | ORAL | Status: DC
  Administered 2017-06-04 – 2017-06-06 (×3): 100 mg via ORAL
  Filled 2017-06-04 (×3): qty 1

## 2017-06-04 MED ORDER — LABETALOL HCL 5 MG/ML IV SOLN: 10.0000 mg | INTRAVENOUS | Status: DC | PRN

## 2017-06-04 MED ORDER — SODIUM CHLORIDE 0.9 % IV BOLUS (SEPSIS)
500.0000 mL | Freq: Once | INTRAVENOUS | Status: AC
  Administered 2017-06-04: 500 mL via INTRAVENOUS

## 2017-06-04 MED ORDER — LORAZEPAM 1 MG PO TABS
1.0000 mg | ORAL_TABLET | Freq: Four times a day (QID) | ORAL | Status: DC | PRN
  Administered 2017-06-05: 17:00:00 1 mg via ORAL
  Filled 2017-06-04: qty 1

## 2017-06-04 MED ORDER — THIAMINE HCL 100 MG/ML IJ SOLN
Freq: Once | INTRAVENOUS | Status: AC
  Administered 2017-06-04: 17:00:00 via INTRAVENOUS
  Filled 2017-06-04: qty 1000

## 2017-06-04 MED ORDER — INSULIN ASPART 100 UNIT/ML ~~LOC~~ SOLN: 0.0000 [IU] | Freq: Every day | SUBCUTANEOUS | Status: DC

## 2017-06-04 MED ORDER — LISINOPRIL 10 MG PO TABS
10.0000 mg | ORAL_TABLET | Freq: Every day | ORAL | Status: DC
  Administered 2017-06-04 – 2017-06-06 (×3): 10 mg via ORAL
  Filled 2017-06-04 (×3): qty 1

## 2017-06-04 MED ORDER — LORAZEPAM 2 MG/ML IJ SOLN
0.0000 mg | Freq: Two times a day (BID) | INTRAMUSCULAR | Status: DC
  Administered 2017-06-06: 01:00:00 1 mg via INTRAVENOUS
  Filled 2017-06-04: qty 1

## 2017-06-04 MED ORDER — ACETAMINOPHEN 325 MG PO TABS: 650.0000 mg | ORAL_TABLET | Freq: Four times a day (QID) | ORAL | Status: DC | PRN

## 2017-06-04 MED ORDER — ACETAMINOPHEN 650 MG RE SUPP: 650.0000 mg | Freq: Four times a day (QID) | RECTAL | Status: DC | PRN

## 2017-06-04 MED ORDER — FOLIC ACID 1 MG PO TABS
1.0000 mg | ORAL_TABLET | Freq: Every day | ORAL | Status: DC
  Administered 2017-06-04 – 2017-06-06 (×3): 1 mg via ORAL
  Filled 2017-06-04 (×3): qty 1

## 2017-06-04 MED ORDER — ONDANSETRON HCL 4 MG/2ML IJ SOLN: 4.0000 mg | Freq: Four times a day (QID) | INTRAMUSCULAR | Status: DC | PRN

## 2017-06-04 MED ORDER — THIAMINE HCL 100 MG/ML IJ SOLN: 100.0000 mg | Freq: Every day | INTRAMUSCULAR | Status: DC

## 2017-06-04 MED ORDER — HYDROCODONE-ACETAMINOPHEN 5-325 MG PO TABS: 1.0000 | ORAL_TABLET | ORAL | Status: DC | PRN

## 2017-06-04 MED ORDER — GABAPENTIN 300 MG PO CAPS
600.0000 mg | ORAL_CAPSULE | Freq: Three times a day (TID) | ORAL | Status: DC
  Administered 2017-06-04 – 2017-06-06 (×7): 600 mg via ORAL
  Filled 2017-06-04 (×7): qty 2

## 2017-06-04 MED ORDER — LORAZEPAM 2 MG/ML IJ SOLN
0.0000 mg | Freq: Four times a day (QID) | INTRAMUSCULAR | Status: AC
  Administered 2017-06-04 – 2017-06-05 (×3): 2 mg via INTRAVENOUS
  Filled 2017-06-04 (×3): qty 1

## 2017-06-04 MED ORDER — HYDROCODONE-ACETAMINOPHEN 5-325 MG PO TABS
2.0000 | ORAL_TABLET | ORAL | Status: DC | PRN
  Administered 2017-06-04 – 2017-06-06 (×7): 2 via ORAL
  Filled 2017-06-04 (×8): qty 2

## 2017-06-04 MED ORDER — ENOXAPARIN SODIUM 40 MG/0.4ML ~~LOC~~ SOLN
40.0000 mg | SUBCUTANEOUS | Status: DC
  Administered 2017-06-04 – 2017-06-05 (×2): 40 mg via SUBCUTANEOUS
  Filled 2017-06-04 (×2): qty 0.4

## 2017-06-04 MED ORDER — ADULT MULTIVITAMIN W/MINERALS CH
1.0000 | ORAL_TABLET | Freq: Every day | ORAL | Status: DC
  Administered 2017-06-04 – 2017-06-06 (×3): 1 via ORAL
  Filled 2017-06-04 (×3): qty 1

## 2017-06-04 MED ORDER — HYDROXYZINE HCL 25 MG PO TABS
25.0000 mg | ORAL_TABLET | Freq: Three times a day (TID) | ORAL | Status: DC | PRN
  Filled 2017-06-04: qty 1

## 2017-06-04 MED ORDER — CHLORDIAZEPOXIDE HCL 5 MG PO CAPS
10.0000 mg | ORAL_CAPSULE | Freq: Three times a day (TID) | ORAL | Status: DC
  Administered 2017-06-04 – 2017-06-06 (×7): 10 mg via ORAL
  Filled 2017-06-04 (×7): qty 2

## 2017-06-04 MED ORDER — LORAZEPAM 2 MG/ML IJ SOLN
1.0000 mg | Freq: Four times a day (QID) | INTRAMUSCULAR | Status: DC | PRN
  Administered 2017-06-04: 02:00:00 1 mg via INTRAVENOUS
  Filled 2017-06-04: qty 1

## 2017-06-04 MED ORDER — CITALOPRAM HYDROBROMIDE 20 MG PO TABS
20.0000 mg | ORAL_TABLET | Freq: Every day | ORAL | Status: DC
  Administered 2017-06-04 – 2017-06-06 (×3): 20 mg via ORAL
  Filled 2017-06-04 (×3): qty 1

## 2017-06-04 MED ORDER — MORPHINE SULFATE (PF) 2 MG/ML IV SOLN
2.0000 mg | INTRAVENOUS | Status: DC | PRN
  Administered 2017-06-04 – 2017-06-06 (×8): 2 mg via INTRAVENOUS
  Filled 2017-06-04 (×8): qty 1

## 2017-06-04 MED ORDER — ONDANSETRON HCL 4 MG PO TABS: 4.0000 mg | ORAL_TABLET | Freq: Four times a day (QID) | ORAL | Status: DC | PRN

## 2017-06-04 NOTE — Progress Notes (Signed)
°   06/04/17 0950  Clinical Encounter Type  Visited With Patient  Visit Type Initial  Referral From Nurse  Consult/Referral To Chaplain  Spiritual Encounters  Spiritual Needs Prayer;Emotional   CH received a PT consult for prayer. CH reported to PT's RM and prayed for PT's health and future.

## 2017-06-04 NOTE — Consult Note (Signed)
[facility] Psychiatry Consult   Reason for Consult: Consult for 57 year old man with a history of alcohol abuse who came into the hospital confused and having alcohol withdrawal Referring Physician:  Manuella Ghazi Patient Identification: Kurt Horton MRN:  [id number] Principal Diagnosis: Alcohol withdrawal [facility]) Diagnosis:   Patient Active Problem List   Diagnosis Date Noted  . Alcohol abuse [F10.10]     Priority: High  . Alcohol withdrawal ([place]) [F10.239] 11/13/2016    Priority: High  . Alcohol withdrawal delirium ([place]) [F10.231] 02/26/2017  . Atrial fibrillation with RVR ([place]) [I48.91] 02/25/2017  . Acute alcoholic intoxication with complication ([place]) [D32.202]   . Substance induced mood disorder ([place]) [F19.94] 11/13/2016  . SVT (supraventricular tachycardia) ([place]) [I47.1] 11/12/2016  . HTN (hypertension) [I10] 11/12/2016  . EtOH dependence ([place]) [F10.20] 11/12/2016  . Lumbar radiculopathy [M54.16] 08/24/2016  . Depression with anxiety [F41.8] 08/24/2016  . Chronic low back pain [M54.5, G89.29] 01/04/2016  . Pressure ulcer [L89.90] 12/29/2015  . Osteomyelitis ([place]) [M86.9] 12/26/2015  . PVD (peripheral vascular disease) ([place]) [I73.9] 12/26/2015  . Type II diabetes mellitus with manifestations ([place]) [E11.8] 12/26/2015  . Myasthenia gravis ([place]) [G70.00] 12/26/2015    Total Time spent with patient: 1 hour  Subjective:   Kurt Horton is a 57 y.o. male patient admitted with "I need to detox".  HPI: Patient interviewed chart reviewed.  57 year old man with a history of alcohol abuse.  He came into the hospital stating that he had been drinking right up until his presentation pretty much.  He had been consuming his usual fifth of liquor a day.  Just got back into town from [place] and decided that he wanted to detox.  Patient today says he is still feeling a little bit tremulous but denies any hallucinations.  He has been able to eat and drink okay.  He has been able to ambulate a  short distance.  Patient says his mood is nervous but not terribly depressed.  Denies suicidal or homicidal ideation.  He says that he is already making plans to try to get into a rehab facility in [place].  He is motivated by having recently visited his family in [place] and feeling that he wants to do something to stop drinking.  He denies that he has been using any other drugs recently.  Medical history: History of high blood pressure diabetes chronic pain  Substance abuse history: Patient has had alcohol problems for over 20 years.  He says the longest sobriety he has had probably only about 20 days.  He has a documented history of delirium tremens here in the hospital previously.  Social history: Currently living with his parents.  Work pattern is intermittent.  The rest of his immediate family lives in [place] with his ex-wife.  Past Psychiatric History: Patient has been seen previously under similar circumstances.  I have seen him in the intensive care unit having DTs.  Frequently he has ultimately declined to participate in substance abuse treatment once he was fully detoxed.  No history of suicide attempts in the past.  Risk to Self: Is patient at risk for suicide?: No Risk to Others:   Prior Inpatient Therapy:   Prior Outpatient Therapy:    Past Medical History:  Past Medical History:  Diagnosis Date  . Alcohol abuse   . Alcohol withdrawal ([place]) 11/12/2016  . Diabetes mellitus without complication ([place])   . DJD (degenerative joint disease)   . Hypertension   . Myasthenia gravis ([place])   .  Renal disorder     Past Surgical History:  Procedure Laterality Date  . AMPUTATION TOE Left 12/29/2015   Procedure: AMPUTATION TOE;  Surgeon: Samara Deist, DPM;  Location: [facility] ORS;  Service: Podiatry;  Laterality: Left;  . PERIPHERAL VASCULAR CATHETERIZATION N/A 12/27/2015   Procedure: Lower Extremity Angiography;  Surgeon: Algernon Huxley, MD;  Location: [facility] CV LAB;  Service:  Cardiovascular;  Laterality: N/A;   Family History:  Family History  Problem Relation Age of Onset  . Rheum arthritis Father    Family Psychiatric  History: None known Social History:  Social History   Substance and Sexual Activity  Alcohol Use Yes   Comment: Pt had tequila and rum, at least a 5th     Social History   Substance and Sexual Activity  Drug Use No    Social History   Socioeconomic History  . Marital status: Legally Separated    Spouse name: None  . Number of children: None  . Years of education: None  . Highest education level: None  Social Needs  . Financial resource strain: None  . Food insecurity - worry: None  . Food insecurity - inability: None  . Transportation needs - medical: None  . Transportation needs - non-medical: None  Occupational History  . None  Tobacco Use  . Smoking status: Current Every Day Smoker    Packs/day: 0.50    Types: Cigarettes  . Smokeless tobacco: Never Used  Substance and Sexual Activity  . Alcohol use: Yes    Comment: Pt had tequila and rum, at least a 5th  . Drug use: No  . Sexual activity: None  Other Topics Concern  . None  Social History Narrative  . None   Additional Social History:    Allergies:   Allergies  Allergen Reactions  . Other Other (See Comments)    Patient states he can't take any "mycin" medications because myasthenia gravis.    Labs:  Results for orders placed or performed during the hospital encounter of 06/03/17 (from the past 48 hour(s))  CBC with Differential     Status: Abnormal   Collection Time: 06/04/17 12:12 AM  Result Value Ref Range   WBC 8.0 3.8 - 10.6 K/uL   RBC 4.02 (L) 4.40 - 5.90 MIL/uL   Hemoglobin 9.9 (L) 13.0 - 18.0 g/dL   HCT 31.4 (L) 40.0 - 52.0 %   MCV 78.1 (L) 80.0 - 100.0 fL   MCH 24.6 (L) 26.0 - 34.0 pg   MCHC 31.5 (L) 32.0 - 36.0 g/dL   RDW 23.4 (H) 11.5 - 14.5 %   Platelets 347 150 - 440 K/uL   Neutrophils Relative % 63 %   Neutro Abs 5.0 1.4 - 6.5  K/uL   Lymphocytes Relative 22 %   Lymphs Abs 1.8 1.0 - 3.6 K/uL   Monocytes Relative 14 %   Monocytes Absolute 1.1 (H) 0.2 - 1.0 K/uL   Eosinophils Relative 0 %   Eosinophils Absolute 0.0 0 - 0.7 K/uL   Basophils Relative 1 %   Basophils Absolute 0.1 0 - 0.1 K/uL    Comment: Performed at [facility], [address], [place], [place] [address]  Basic metabolic panel     Status: Abnormal   Collection Time: 06/04/17 12:12 AM  Result Value Ref Range   Sodium 137 135 - 145 mmol/L   Potassium 3.5 3.5 - 5.1 mmol/L   Chloride 103 101 - 111 mmol/L   CO2 25 22 -  32 mmol/L   Glucose, Bld 103 (H) 65 - 99 mg/dL   BUN 5 (L) 6 - 20 mg/dL   Creatinine, Ser 0.65 0.61 - 1.24 mg/dL   Calcium 8.5 (L) 8.9 - 10.3 mg/dL   GFR calc non Af Amer >60 >60 mL/min   GFR calc Af Amer >60 >60 mL/min    Comment: (NOTE) The eGFR has been calculated using the CKD EPI equation. This calculation has not been validated in all clinical situations. eGFR's persistently <60 mL/min signify possible Chronic Kidney Disease.    Anion gap 9 5 - 15    Comment: Performed at [facility], [address] [place], [place] [address]  Glucose, capillary     Status: Abnormal   Collection Time: 06/04/17  1:41 AM  Result Value Ref Range   Glucose-Capillary 109 (H) 65 - 99 mg/dL  Basic metabolic panel     Status: Abnormal   Collection Time: 06/04/17  4:27 AM  Result Value Ref Range   Sodium 137 135 - 145 mmol/L   Potassium 3.7 3.5 - 5.1 mmol/L   Chloride 103 101 - 111 mmol/L   CO2 25 22 - 32 mmol/L   Glucose, Bld 118 (H) 65 - 99 mg/dL   BUN 7 6 - 20 mg/dL   Creatinine, Ser 0.66 0.61 - 1.24 mg/dL   Calcium 8.6 (L) 8.9 - 10.3 mg/dL   GFR calc non Af Amer >60 >60 mL/min   GFR calc Af Amer >60 >60 mL/min    Comment: (NOTE) The eGFR has been calculated using the CKD EPI equation. This calculation has not been validated in all clinical situations. eGFR's persistently <60 mL/min signify possible Chronic  Kidney Disease.    Anion gap 9 5 - 15    Comment: Performed at [facility], [address] [place], [place] [address]  CBC with Differential/Platelet     Status: Abnormal   Collection Time: 06/04/17  4:27 AM  Result Value Ref Range   WBC 6.3 3.8 - 10.6 K/uL   RBC 3.96 (L) 4.40 - 5.90 MIL/uL   Hemoglobin 9.9 (L) 13.0 - 18.0 g/dL   HCT 31.3 (L) 40.0 - 52.0 %   MCV 78.9 (L) 80.0 - 100.0 fL   MCH 25.0 (L) 26.0 - 34.0 pg   MCHC 31.7 (L) 32.0 - 36.0 g/dL   RDW 24.0 (H) 11.5 - 14.5 %   Platelets 329 150 - 440 K/uL   Neutrophils Relative % 56 %   Neutro Abs 3.6 1.4 - 6.5 K/uL   Lymphocytes Relative 23 %   Lymphs Abs 1.5 1.0 - 3.6 K/uL   Monocytes Relative 17 %   Monocytes Absolute 1.1 (H) 0.2 - 1.0 K/uL   Eosinophils Relative 2 %   Eosinophils Absolute 0.1 0 - 0.7 K/uL   Basophils Relative 2 %   Basophils Absolute 0.1 0 - 0.1 K/uL    Comment: Performed at [facility], [address] [place], [place] [address]  Glucose, capillary     Status: Abnormal   Collection Time: 06/04/17  7:35 AM  Result Value Ref Range   Glucose-Capillary 106 (H) 65 - 99 mg/dL  Glucose, capillary     Status: Abnormal   Collection Time: 06/04/17 11:30 AM  Result Value Ref Range   Glucose-Capillary 121 (H) 65 - 99 mg/dL  Glucose, capillary     Status: Abnormal   Collection Time: 06/04/17  4:58 PM  Result Value Ref Range   Glucose-Capillary 128 (  H) 65 - 99 mg/dL    Current Facility-Administered Medications  Medication Dose Route Frequency Provider Last Rate Last Dose  . acetaminophen (TYLENOL) tablet 650 mg  650 mg Oral Q6H PRN Lance Coon, MD       Or  . acetaminophen (TYLENOL) suppository 650 mg  650 mg Rectal Q6H PRN Lance Coon, MD      . carvedilol (COREG) tablet 6.25 mg  6.25 mg Oral BID WC Lance Coon, MD   6.25 mg at 06/04/17 1721  . chlordiazePOXIDE (LIBRIUM) capsule 10 mg  10 mg Oral TID Lance Coon, MD   10 mg at 06/04/17 1548  . citalopram (CELEXA) tablet 20 mg   20 mg Oral Daily Lance Coon, MD   20 mg at 06/04/17 0839  . enoxaparin (LOVENOX) injection 40 mg  40 mg Subcutaneous Q24H Lance Coon, MD      . folic acid Darnelle Catalan) tablet 1 mg  1 mg Oral Daily Lance Coon, MD   1 mg at 06/04/17 0839  . gabapentin (NEURONTIN) capsule 600 mg  600 mg Oral TID Lance Coon, MD   600 mg at 06/04/17 1548  . HYDROcodone-acetaminophen (NORCO/VICODIN) 5-325 MG per tablet 2 tablet  2 tablet Oral Q4H PRN Max Sane, MD   2 tablet at 06/04/17 1435  . hydrOXYzine (ATARAX/VISTARIL) tablet 25 mg  25 mg Oral TID PRN Lance Coon, MD      . insulin aspart (novoLOG) injection 0-5 Units  0-5 Units Subcutaneous QHS Lance Coon, MD      . insulin aspart (novoLOG) injection 0-9 Units  0-9 Units Subcutaneous TID WC Lance Coon, MD   1 Units at 06/04/17 1722  . labetalol (NORMODYNE,TRANDATE) injection 10 mg  10 mg Intravenous Q2H PRN Lance Coon, MD      . lisinopril (PRINIVIL,ZESTRIL) tablet 10 mg  10 mg Oral Daily Lance Coon, MD   10 mg at 06/04/17 0839  . LORazepam (ATIVAN) injection 0-4 mg  0-4 mg Intravenous Q6H Lance Coon, MD   2 mg at 06/04/17 0841   Followed by  . [START ON 06/06/2017] LORazepam (ATIVAN) injection 0-4 mg  0-4 mg Intravenous Q12H Lance Coon, MD      . LORazepam (ATIVAN) tablet 1 mg  1 mg Oral Q6H PRN Lance Coon, MD       Or  . LORazepam (ATIVAN) injection 1 mg  1 mg Intravenous Q6H PRN Lance Coon, MD   1 mg at 06/04/17 0202  . morphine 2 MG/ML injection 2 mg  2 mg Intravenous Q4H PRN Max Sane, MD   2 mg at 06/04/17 1722  . multivitamin with minerals tablet 1 tablet  1 tablet Oral Daily Lance Coon, MD   1 tablet at 06/04/17 1129  . ondansetron (ZOFRAN) tablet 4 mg  4 mg Oral Q6H PRN Lance Coon, MD       Or  . ondansetron [facility]) injection 4 mg  4 mg Intravenous Q6H PRN Lance Coon, MD      . thiamine (VITAMIN B-1) tablet 100 mg  100 mg Oral Daily Lance Coon, MD   100 mg at 06/04/17 0840   Or  . thiamine (B-1)  injection 100 mg  100 mg Intravenous Daily Lance Coon, MD        Musculoskeletal: Strength & Muscle Tone: decreased Gait & Station: unsteady Patient leans: N/A  Psychiatric Specialty Exam: Physical Exam  Nursing note and vitals reviewed. Constitutional: He appears well-developed and well-nourished.  HENT:  Head: Normocephalic and atraumatic.  Eyes: Conjunctivae are normal. Pupils are equal, round, and reactive to light.  Neck: Normal range of motion.  Cardiovascular: Regular rhythm and normal heart sounds.  Respiratory: Effort normal. No respiratory distress.  GI: Soft.  Musculoskeletal: Normal range of motion.  Neurological: He is alert. He displays tremor.  Skin: Skin is warm and dry.  Psychiatric: His mood appears anxious. His speech is delayed. He is slowed. Thought content is not paranoid. He expresses impulsivity. He expresses no homicidal and no suicidal ideation. He exhibits abnormal recent memory.    Review of Systems  Constitutional: Negative.   HENT: Negative.   Eyes: Negative.   Respiratory: Negative.   Cardiovascular: Negative.   Gastrointestinal: Negative.   Musculoskeletal: Negative.   Skin: Negative.   Neurological: Negative.   Psychiatric/Behavioral: Positive for depression, memory loss and substance abuse. Negative for hallucinations and suicidal ideas. The patient is nervous/anxious and has insomnia.     Blood pressure 102/64, pulse 81, temperature 98.4 F (36.9 C), temperature source Oral, resp. rate 18, height '5\' 10"'$  (1.778 m), weight 84 kg (185 lb 3.2 oz), SpO2 97 %.Body mass index is 26.57 kg/m.  General Appearance: Disheveled  Eye Contact:  Fair  Speech:  Slow  Volume:  Decreased  Mood:  Dysphoric  Affect:  Depressed  Thought Process:  Goal Directed  Orientation:  Full (Time, Place, and Person)  Thought Content:  Tangential  Suicidal Thoughts:  No  Homicidal Thoughts:  No  Memory:  Immediate;   Fair Recent;   Poor Remote;   Fair   Judgement:  Fair  Insight:  Fair  Psychomotor Activity:  Tremor  Concentration:  Concentration: Fair  Recall:  [employer] of Knowledge:  Fair  Language:  Fair  Akathisia:  No  Handed:  Right  AIMS (if indicated):     Assets:  Financial Resources/Insurance Housing Social Support  ADL's:  Impaired  Cognition:  Impaired,  Mild  Sleep:        Treatment Plan Summary: Daily contact with patient to assess and evaluate symptoms and progress in treatment, Medication management and Plan 57 year old man with alcohol abuse currently having alcohol withdrawal treatment.  He is a little bit tremulous and still has had some irregular vital signs is no sign of delirium.  He is at this point only about 24 hours out from his last drink.  We discussed how he is still at risk of having delirium tremens within the next day or 2.  Continue CIWA protocol.  No need for antidepressant or other new medication.  Strongly encouraged him and what he says is his plan to get into a rehab program in [place].  Disposition: Patient does not meet criteria for psychiatric inpatient admission. Supportive therapy provided about ongoing stressors. Discussed crisis plan, support from social network, calling 911, coming to the Emergency Department, and calling Suicide Hotline.  Alethia Berthold, MD 06/04/2017 6:40 PM

## 2017-06-04 NOTE — Plan of Care (Signed)
In am HR was in the 120's-130's ST, CIWA 11, 2mg  ativan given with improvement. During the shift CIWA<5, ativan not given. C/o chronic lower back and neck pain, Morphine and norco given with improvement.

## 2017-06-04 NOTE — Progress Notes (Signed)
[employer] [facility] - [place] at [facility]   PATIENT NAME: Kurt Horton    MR#:  [id number]  DATE OF BIRTH:  January 29, 1961  SUBJECTIVE:  CHIEF COMPLAINT:   Chief Complaint  Patient presents with  . Withdrawal  still shaking, requests higher dose of ativan REVIEW OF SYSTEMS:  Review of Systems  Constitutional: Negative for chills, fever and weight loss.  HENT: Negative for nosebleeds and sore throat.   Eyes: Negative for blurred vision.  Respiratory: Negative for cough, shortness of breath and wheezing.   Cardiovascular: Negative for chest pain, orthopnea, leg swelling and PND.  Gastrointestinal: Negative for abdominal pain, constipation, diarrhea, heartburn, nausea and vomiting.  Genitourinary: Negative for dysuria and urgency.  Musculoskeletal: Negative for back pain.  Skin: Negative for rash.  Neurological: Negative for dizziness, speech change, focal weakness and headaches.  Endo/Heme/Allergies: Does not bruise/bleed easily.  Psychiatric/Behavioral: Negative for depression. The patient is nervous/anxious.    DRUG ALLERGIES:   Allergies  Allergen Reactions  . Other Other (See Comments)    Patient states he can't take any "mycin" medications because myasthenia gravis.   VITALS:  Blood pressure (!) 141/84, pulse (!) 129, temperature 98.7 F (37.1 C), resp. rate 18, height 5\' 10"  (1.778 m), weight 84 kg (185 lb 3.2 oz), SpO2 98 %. PHYSICAL EXAMINATION:  Physical Exam  Constitutional: He is oriented to person, place, and time and well-developed, well-nourished, and in no distress.  HENT:  Head: Normocephalic and atraumatic.  Eyes: Conjunctivae and EOM are normal. Pupils are equal, round, and reactive to light.  Neck: Normal range of motion. Neck supple. No tracheal deviation present. No thyromegaly present.  Cardiovascular: Normal rate, regular rhythm and normal heart sounds.  Pulmonary/Chest: Effort normal and breath sounds normal. No respiratory distress. He has  no wheezes. He exhibits no tenderness.  Abdominal: Soft. Bowel sounds are normal. He exhibits no distension. There is no tenderness.  Musculoskeletal: Normal range of motion.  Neurological: He is alert and oriented to person, place, and time. No cranial nerve deficit.  Skin: Skin is warm and dry. No rash noted.  Psychiatric: Affect normal. His mood appears anxious.   LABORATORY PANEL:  Male CBC Recent Labs  Lab 06/04/17 0427  WBC 6.3  HGB 9.9*  HCT 31.3*  PLT 329   ------------------------------------------------------------------------------------------------------------------ Chemistries  Recent Labs  Lab 06/04/17 0427  NA 137  K 3.7  CL 103  CO2 25  GLUCOSE 118*  BUN 7  CREATININE 0.66  CALCIUM 8.6*   RADIOLOGY:  No results found. ASSESSMENT AND PLAN:  57 y.o. male admitted for tachycardia, tremor, patient states he is in withdrawal.  Patient has been in withdrawal in the past requiring hospitalization.  He states that he is returning from being out of town for some time, and that he has not had anything to drink since earlier this morning but today he feels like he has gone into withdrawal.  Workup in the ED is consistent with the same, hospitalist were called for admission  * Alcohol withdrawal  - counseled and requested to attend AA at D/C - continue CIWA protocol, Librium, patient is interested in looking for a longer term detox when he is ready for discharge (he's interested in going to [place] where there is program from his [employer])    * Type II diabetes mellitus with manifestations  - Sliding scale insulin   * HTN (hypertension) - continue coreg, labetalol, lisinopril  * Anemia of Chronic Dz - stable, Hb 9.9  All the records are reviewed and case discussed with Care Management/Social Worker. Management plans discussed with the patient, nursing and they are in agreement.  CODE STATUS: Full Code  TOTAL TIME TAKING CARE OF THIS PATIENT: 35  minutes.   More than 50% of the time was spent in counseling/coordination of care: YES  POSSIBLE D/C IN 1-2 DAYS, DEPENDING ON CLINICAL CONDITION.   Delfino LovettVipul Deen Deguia M.D on 06/04/2017 at 12:55 PM  Between 7am to 6pm - Pager - [phone]  After 6pm go to [email or web address] - Social research officer, governmentpassword EPAS [facility]  [facility] [place] [facility]  Office  [phone]  CC: Primary care physician; Lamar BlinksKowalski, Bruce J, MD  Note: This dictation was prepared with Dragon dictation along with smaller phrase technology. Any transcriptional errors that result from this process are unintentional.

## 2017-06-05 LAB — COMPREHENSIVE METABOLIC PANEL
ALBUMIN: 3.2 g/dL — AB (ref 3.5–5.0)
ALT: 20 U/L (ref 17–63)
AST: 24 U/L (ref 15–41)
Alkaline Phosphatase: 84 U/L (ref 38–126)
Anion gap: 7 (ref 5–15)
BILIRUBIN TOTAL: 0.4 mg/dL (ref 0.3–1.2)
BUN: 16 mg/dL (ref 6–20)
CO2: 23 mmol/L (ref 22–32)
Calcium: 8.4 mg/dL — ABNORMAL LOW (ref 8.9–10.3)
Chloride: 104 mmol/L (ref 101–111)
Creatinine, Ser: 0.84 mg/dL (ref 0.61–1.24)
GFR calc non Af Amer: >60 mL/min (ref >60)
GLUCOSE: 109 mg/dL — AB (ref 65–99)
POTASSIUM: 4 mmol/L (ref 3.5–5.1)
SODIUM: 134 mmol/L — AB (ref 135–145)
TOTAL PROTEIN: 5.9 g/dL — AB (ref 6.5–8.1)

## 2017-06-05 LAB — GLUCOSE, CAPILLARY
GLUCOSE-CAPILLARY: 104 mg/dL — AB (ref 65–99)
GLUCOSE-CAPILLARY: 107 mg/dL — AB (ref 65–99)
GLUCOSE-CAPILLARY: 121 mg/dL — AB (ref 65–99)
GLUCOSE-CAPILLARY: 98 mg/dL (ref 65–99)

## 2017-06-05 LAB — CBC
HEMATOCRIT: 28.9 % — AB (ref 40.0–52.0)
Hemoglobin: 9 g/dL — ABNORMAL LOW (ref 13.0–18.0)
MCH: 24.9 pg — ABNORMAL LOW (ref 26.0–34.0)
MCHC: 31.3 g/dL — AB (ref 32.0–36.0)
MCV: 79.7 fL — ABNORMAL LOW (ref 80.0–100.0)
Platelets: 315 10*3/uL (ref 150–440)
RBC: 3.63 MIL/uL — ABNORMAL LOW (ref 4.40–5.90)
RDW: 23.8 % — ABNORMAL HIGH (ref 11.5–14.5)
WBC: 5.7 10*3/uL (ref 3.8–10.6)

## 2017-06-05 NOTE — Consult Note (Signed)
[place] Psychiatry Consult   Reason for Consult: Follow-up consult 57 year old man with alcohol abuse currently in the hospital having detox treatment Referring Physician: Manuella Ghazi Patient Identification: Kurt Horton MRN:  [id number] Principal Diagnosis: Alcohol withdrawal [facility]) Diagnosis:   Patient Active Problem List   Diagnosis Date Noted  . Alcohol abuse [F10.10]     Priority: High  . Alcohol withdrawal ([place]) [F10.239] 11/13/2016    Priority: High  . Alcohol withdrawal delirium ([place]) [F10.231] 02/26/2017  . Atrial fibrillation with RVR ([place]) [I48.91] 02/25/2017  . Acute alcoholic intoxication with complication ([place]) [V95.638]   . Substance induced mood disorder ([place]) [F19.94] 11/13/2016  . SVT (supraventricular tachycardia) ([place]) [I47.1] 11/12/2016  . HTN (hypertension) [I10] 11/12/2016  . EtOH dependence ([place]) [F10.20] 11/12/2016  . Lumbar radiculopathy [M54.16] 08/24/2016  . Depression with anxiety [F41.8] 08/24/2016  . Chronic low back pain [M54.5, G89.29] 01/04/2016  . Pressure ulcer [L89.90] 12/29/2015  . Osteomyelitis ([place]) [M86.9] 12/26/2015  . PVD (peripheral vascular disease) ([place]) [I73.9] 12/26/2015  . Type II diabetes mellitus with manifestations ([place]) [E11.8] 12/26/2015  . Myasthenia gravis ([place]) [G70.00] 12/26/2015    Total Time spent with patient: 30 minutes  Subjective:   Kurt Horton is a 57 y.o. male patient admitted with "I am doing better".  HPI: Follow-up note from yesterday's consult.  Patient seen chart reviewed.  Patient reports that he is feeling a little bit better.  He is eating okay.  Still a little bit unsteady when he walks around.  Denies any visual hallucinations.  Does not appear to be delirious.  Blunted affect but appropriate interaction.  He tells me that he and his family are still focused on trying to have him accepted at a rehab program in [place].  Patient is not reporting acute mood symptoms and he denies any suicidal  ideation.  Past Psychiatric History: History of long-standing alcohol abuse with several episodes of detox and a past history of delirium tremens  Risk to Self: Is patient at risk for suicide?: No Risk to Others:   Prior Inpatient Therapy:   Prior Outpatient Therapy:    Past Medical History:  Past Medical History:  Diagnosis Date  . Alcohol abuse   . Alcohol withdrawal ([place]) 11/12/2016  . Diabetes mellitus without complication ([place])   . DJD (degenerative joint disease)   . Hypertension   . Myasthenia gravis ([place])   . Renal disorder     Past Surgical History:  Procedure Laterality Date  . AMPUTATION TOE Left 12/29/2015   Procedure: AMPUTATION TOE;  Surgeon: Samara Deist, DPM;  Location: [facility] ORS;  Service: Podiatry;  Laterality: Left;  . PERIPHERAL VASCULAR CATHETERIZATION N/A 12/27/2015   Procedure: Lower Extremity Angiography;  Surgeon: Algernon Huxley, MD;  Location: [place] CV LAB;  Service: Cardiovascular;  Laterality: N/A;   Family History:  Family History  Problem Relation Age of Onset  . Rheum arthritis Father    Family Psychiatric  History: None known Social History:  Social History   Substance and Sexual Activity  Alcohol Use Yes   Comment: Pt had tequila and rum, at least a 5th     Social History   Substance and Sexual Activity  Drug Use No    Social History   Socioeconomic History  . Marital status: Legally Separated    Spouse name: None  . Number of children: None  . Years of education: None  . Highest education level: None  Social Needs  . Financial resource strain:  None  . Food insecurity - worry: None  . Food insecurity - inability: None  . Transportation needs - medical: None  . Transportation needs - non-medical: None  Occupational History  . None  Tobacco Use  . Smoking status: Current Every Day Smoker    Packs/day: 0.50    Types: Cigarettes  . Smokeless tobacco: Never Used  Substance and Sexual Activity  . Alcohol use: Yes     Comment: Pt had tequila and rum, at least a 5th  . Drug use: No  . Sexual activity: None  Other Topics Concern  . None  Social History Narrative  . None   Additional Social History:    Allergies:   Allergies  Allergen Reactions  . Other Other (See Comments)    Patient states he can't take any "mycin" medications because myasthenia gravis.    Labs:  Results for orders placed or performed during the hospital encounter of 06/03/17 (from the past 48 hour(s))  CBC with Differential     Status: Abnormal   Collection Time: 06/04/17 12:12 AM  Result Value Ref Range   WBC 8.0 3.8 - 10.6 K/uL   RBC 4.02 (L) 4.40 - 5.90 MIL/uL   Hemoglobin 9.9 (L) 13.0 - 18.0 g/dL   HCT 31.4 (L) 40.0 - 52.0 %   MCV 78.1 (L) 80.0 - 100.0 fL   MCH 24.6 (L) 26.0 - 34.0 pg   MCHC 31.5 (L) 32.0 - 36.0 g/dL   RDW 23.4 (H) 11.5 - 14.5 %   Platelets 347 150 - 440 K/uL   Neutrophils Relative % 63 %   Neutro Abs 5.0 1.4 - 6.5 K/uL   Lymphocytes Relative 22 %   Lymphs Abs 1.8 1.0 - 3.6 K/uL   Monocytes Relative 14 %   Monocytes Absolute 1.1 (H) 0.2 - 1.0 K/uL   Eosinophils Relative 0 %   Eosinophils Absolute 0.0 0 - 0.7 K/uL   Basophils Relative 1 %   Basophils Absolute 0.1 0 - 0.1 K/uL    Comment: Performed at [facility], [address], [place], [place] [address]  Basic metabolic panel     Status: Abnormal   Collection Time: 06/04/17 12:12 AM  Result Value Ref Range   Sodium 137 135 - 145 mmol/L   Potassium 3.5 3.5 - 5.1 mmol/L   Chloride 103 101 - 111 mmol/L   CO2 25 22 - 32 mmol/L   Glucose, Bld 103 (H) 65 - 99 mg/dL   BUN 5 (L) 6 - 20 mg/dL   Creatinine, Ser 0.65 0.61 - 1.24 mg/dL   Calcium 8.5 (L) 8.9 - 10.3 mg/dL   GFR calc non Af Amer >60 >60 mL/min   GFR calc Af Amer >60 >60 mL/min    Comment: (NOTE) The eGFR has been calculated using the CKD EPI equation. This calculation has not been validated in all clinical situations. eGFR's persistently <60 mL/min signify possible  Chronic Kidney Disease.    Anion gap 9 5 - 15    Comment: Performed at [facility], [address] [place], [place] [address]  Glucose, capillary     Status: Abnormal   Collection Time: 06/04/17  1:41 AM  Result Value Ref Range   Glucose-Capillary 109 (H) 65 - 99 mg/dL  Basic metabolic panel     Status: Abnormal   Collection Time: 06/04/17  4:27 AM  Result Value Ref Range   Sodium 137 135 - 145 mmol/L   Potassium 3.7 3.5 - 5.1  mmol/L   Chloride 103 101 - 111 mmol/L   CO2 25 22 - 32 mmol/L   Glucose, Bld 118 (H) 65 - 99 mg/dL   BUN 7 6 - 20 mg/dL   Creatinine, Ser 0.66 0.61 - 1.24 mg/dL   Calcium 8.6 (L) 8.9 - 10.3 mg/dL   GFR calc non Af Amer >60 >60 mL/min   GFR calc Af Amer >60 >60 mL/min    Comment: (NOTE) The eGFR has been calculated using the CKD EPI equation. This calculation has not been validated in all clinical situations. eGFR's persistently <60 mL/min signify possible Chronic Kidney Disease.    Anion gap 9 5 - 15    Comment: Performed at [facility], [address] [place], [place] [address]  CBC with Differential/Platelet     Status: Abnormal   Collection Time: 06/04/17  4:27 AM  Result Value Ref Range   WBC 6.3 3.8 - 10.6 K/uL   RBC 3.96 (L) 4.40 - 5.90 MIL/uL   Hemoglobin 9.9 (L) 13.0 - 18.0 g/dL   HCT 31.3 (L) 40.0 - 52.0 %   MCV 78.9 (L) 80.0 - 100.0 fL   MCH 25.0 (L) 26.0 - 34.0 pg   MCHC 31.7 (L) 32.0 - 36.0 g/dL   RDW 24.0 (H) 11.5 - 14.5 %   Platelets 329 150 - 440 K/uL   Neutrophils Relative % 56 %   Neutro Abs 3.6 1.4 - 6.5 K/uL   Lymphocytes Relative 23 %   Lymphs Abs 1.5 1.0 - 3.6 K/uL   Monocytes Relative 17 %   Monocytes Absolute 1.1 (H) 0.2 - 1.0 K/uL   Eosinophils Relative 2 %   Eosinophils Absolute 0.1 0 - 0.7 K/uL   Basophils Relative 2 %   Basophils Absolute 0.1 0 - 0.1 K/uL    Comment: Performed at [facility], [address] [place], [place] [address]  Glucose, capillary     Status: Abnormal    Collection Time: 06/04/17  7:35 AM  Result Value Ref Range   Glucose-Capillary 106 (H) 65 - 99 mg/dL  Glucose, capillary     Status: Abnormal   Collection Time: 06/04/17 11:30 AM  Result Value Ref Range   Glucose-Capillary 121 (H) 65 - 99 mg/dL  Glucose, capillary     Status: Abnormal   Collection Time: 06/04/17  4:58 PM  Result Value Ref Range   Glucose-Capillary 128 (H) 65 - 99 mg/dL  Glucose, capillary     Status: Abnormal   Collection Time: 06/04/17  9:47 PM  Result Value Ref Range   Glucose-Capillary 116 (H) 65 - 99 mg/dL  CBC     Status: Abnormal   Collection Time: 06/05/17  6:13 AM  Result Value Ref Range   WBC 5.7 3.8 - 10.6 K/uL   RBC 3.63 (L) 4.40 - 5.90 MIL/uL   Hemoglobin 9.0 (L) 13.0 - 18.0 g/dL   HCT 28.9 (L) 40.0 - 52.0 %   MCV 79.7 (L) 80.0 - 100.0 fL   MCH 24.9 (L) 26.0 - 34.0 pg   MCHC 31.3 (L) 32.0 - 36.0 g/dL   RDW 23.8 (H) 11.5 - 14.5 %   Platelets 315 150 - 440 K/uL    Comment: Performed at [facility], [address] [place], [place] [address]  Comprehensive metabolic panel     Status: Abnormal   Collection Time: 06/05/17  6:13 AM  Result Value Ref Range   Sodium 134 (L) 135 - 145 mmol/L  Potassium 4.0 3.5 - 5.1 mmol/L   Chloride 104 101 - 111 mmol/L   CO2 23 22 - 32 mmol/L   Glucose, Bld 109 (H) 65 - 99 mg/dL   BUN 16 6 - 20 mg/dL   Creatinine, Ser 0.84 0.61 - 1.24 mg/dL   Calcium 8.4 (L) 8.9 - 10.3 mg/dL   Total Protein 5.9 (L) 6.5 - 8.1 g/dL   Albumin 3.2 (L) 3.5 - 5.0 g/dL   AST 24 15 - 41 U/L   ALT 20 17 - 63 U/L   Alkaline Phosphatase 84 38 - 126 U/L   Total Bilirubin 0.4 0.3 - 1.2 mg/dL   GFR calc non Af Amer >60 >60 mL/min   GFR calc Af Amer >60 >60 mL/min    Comment: (NOTE) The eGFR has been calculated using the CKD EPI equation. This calculation has not been validated in all clinical situations. eGFR's persistently <60 mL/min signify possible Chronic Kidney Disease.    Anion gap 7 5 - 15    Comment: Performed at  [facility], [address] [place], [place] [address]  Glucose, capillary     Status: Abnormal   Collection Time: 06/05/17  7:27 AM  Result Value Ref Range   Glucose-Capillary 104 (H) 65 - 99 mg/dL  Glucose, capillary     Status: Abnormal   Collection Time: 06/05/17 11:58 AM  Result Value Ref Range   Glucose-Capillary 107 (H) 65 - 99 mg/dL    Current Facility-Administered Medications  Medication Dose Route Frequency Provider Last Rate Last Dose  . acetaminophen (TYLENOL) tablet 650 mg  650 mg Oral Q6H PRN Lance Coon, MD       Or  . acetaminophen (TYLENOL) suppository 650 mg  650 mg Rectal Q6H PRN Lance Coon, MD      . carvedilol (COREG) tablet 6.25 mg  6.25 mg Oral BID WC Lance Coon, MD   6.25 mg at 06/05/17 0915  . chlordiazePOXIDE (LIBRIUM) capsule 10 mg  10 mg Oral TID Lance Coon, MD   10 mg at 06/05/17 0915  . citalopram (CELEXA) tablet 20 mg  20 mg Oral Daily Lance Coon, MD   20 mg at 06/05/17 0915  . enoxaparin (LOVENOX) injection 40 mg  40 mg Subcutaneous Q24H Lance Coon, MD   40 mg at 06/04/17 2139  . folic acid (FOLVITE) tablet 1 mg  1 mg Oral Daily Lance Coon, MD   1 mg at 06/05/17 0915  . gabapentin (NEURONTIN) capsule 600 mg  600 mg Oral TID Lance Coon, MD   600 mg at 06/05/17 0915  . HYDROcodone-acetaminophen (NORCO/VICODIN) 5-325 MG per tablet 2 tablet  2 tablet Oral Q4H PRN Max Sane, MD   2 tablet at 06/05/17 0914  . hydrOXYzine (ATARAX/VISTARIL) tablet 25 mg  25 mg Oral TID PRN Lance Coon, MD      . insulin aspart (novoLOG) injection 0-5 Units  0-5 Units Subcutaneous QHS Lance Coon, MD      . insulin aspart (novoLOG) injection 0-9 Units  0-9 Units Subcutaneous TID WC Lance Coon, MD   1 Units at 06/04/17 1722  . labetalol (NORMODYNE,TRANDATE) injection 10 mg  10 mg Intravenous Q2H PRN Lance Coon, MD      . lisinopril (PRINIVIL,ZESTRIL) tablet 10 mg  10 mg Oral Daily Lance Coon, MD   10 mg at 06/05/17 0915  . LORazepam  (ATIVAN) injection 0-4 mg  0-4 mg Intravenous Q6H Lance Coon, MD   2 mg at 06/05/17 [phone]  Followed by  . [START ON 06/06/2017] LORazepam (ATIVAN) injection 0-4 mg  0-4 mg Intravenous Q12H Lance Coon, MD      . LORazepam (ATIVAN) tablet 1 mg  1 mg Oral Q6H PRN Lance Coon, MD       Or  . LORazepam (ATIVAN) injection 1 mg  1 mg Intravenous Q6H PRN Lance Coon, MD   1 mg at 06/04/17 0202  . morphine 2 MG/ML injection 2 mg  2 mg Intravenous Q4H PRN Max Sane, MD   2 mg at 06/05/17 1239  . multivitamin with minerals tablet 1 tablet  1 tablet Oral Daily Lance Coon, MD   1 tablet at 06/05/17 0914  . ondansetron (ZOFRAN) tablet 4 mg  4 mg Oral Q6H PRN Lance Coon, MD       Or  . ondansetron [facility]) injection 4 mg  4 mg Intravenous Q6H PRN Lance Coon, MD      . thiamine (VITAMIN B-1) tablet 100 mg  100 mg Oral Daily Lance Coon, MD   100 mg at 06/05/17 4315   Or  . thiamine (B-1) injection 100 mg  100 mg Intravenous Daily Lance Coon, MD        Musculoskeletal: Strength & Muscle Tone: decreased Gait & Station: unsteady Patient leans: N/A  Psychiatric Specialty Exam: Physical Exam  Nursing note and vitals reviewed. Constitutional: He appears well-developed and well-nourished.  HENT:  Head: Normocephalic and atraumatic.  Eyes: Conjunctivae are normal. Pupils are equal, round, and reactive to light.  Neck: Normal range of motion.  Cardiovascular: Regular rhythm and normal heart sounds.  Respiratory: Effort normal. No respiratory distress.  GI: Soft.  Musculoskeletal: Normal range of motion.  Neurological: He is alert. Coordination abnormal.  Skin: Skin is warm and dry.  Psychiatric: Judgment normal. His affect is blunt. His speech is delayed. He is slowed. Thought content is not paranoid. He expresses no homicidal and no suicidal ideation. He exhibits abnormal recent memory.    Review of Systems  Constitutional: Negative.   HENT: Negative.   Eyes: Negative.    Respiratory: Negative.   Cardiovascular: Negative.   Gastrointestinal: Negative.   Musculoskeletal: Negative.   Skin: Negative.   Neurological: Positive for tremors.  Psychiatric/Behavioral: Positive for substance abuse. Negative for depression, hallucinations, memory loss and suicidal ideas. The patient is nervous/anxious. The patient does not have insomnia.     Blood pressure 121/71, pulse 85, temperature 97.7 F (36.5 C), temperature source Oral, resp. rate 20, height '5\' 10"'$  (1.778 m), weight 84 kg (185 lb 3.2 oz), SpO2 97 %.Body mass index is 26.57 kg/m.  General Appearance: Disheveled  Eye Contact:  Fair  Speech:  Slow  Volume:  Decreased  Mood:  Euthymic  Affect:  Constricted  Thought Process:  Goal Directed  Orientation:  Full (Time, Place, and Person)  Thought Content:  Logical  Suicidal Thoughts:  No  Homicidal Thoughts:  No  Memory:  Immediate;   Fair Recent;   Fair Remote;   Fair  Judgement:  Fair  Insight:  Fair  Psychomotor Activity:  Tremor  Concentration:  Concentration: Fair  Recall:  [employer] of Knowledge:  Fair  Language:  Fair  Akathisia:  No  Handed:  Right  AIMS (if indicated):     Assets:  Communication Skills Desire for Improvement Financial Resources/Insurance Housing Social Support  ADL's:  Impaired  Cognition:  Impaired,  Mild  Sleep:        Treatment Plan Summary: Plan 57 year old man with  alcohol dependence.  Today he is continuing to have some tremulousness and would be a little bit unsteady but he is denying any suicidal thoughts and his affect appears to be stable.  He does not appear to be delirious.  Denies hallucinations.  He is focused on appropriate plans for rehab in the future.  No indication or need to change any medication.  Supportive counseling and encouragement.  Follow-up as needed.  Disposition: No evidence of imminent risk to self or others at present.   Patient does not meet criteria for psychiatric inpatient  admission. Supportive therapy provided about ongoing stressors.  Alethia Berthold, MD 06/05/2017 3:45 PM

## 2017-06-05 NOTE — Progress Notes (Signed)
°   06/05/17 1430  Clinical Encounter Type  Visited With Patient  Visit Type Follow-up  Referral From Nurse  Consult/Referral To Chaplain  Spiritual Encounters  Spiritual Needs Prayer;Emotional   CH followed up with PT while rounding in CH's assigned unit. PT was in and out of sleep. CH told PT to sleep and CH would return later.

## 2017-06-05 NOTE — Progress Notes (Signed)
[facility] - [place] [facility]   PATIENT NAME: Kurt Horton    MR#:  [id number]  DATE OF BIRTH:  [birth year]  SUBJECTIVE:  CHIEF COMPLAINT:   Chief Complaint  Patient presents with  . Withdrawal  less shaky today. Would like 2nd opinion for his back issues. REVIEW OF SYSTEMS:  Review of Systems  Constitutional: Negative for chills, fever and weight loss.  HENT: Negative for nosebleeds and sore throat.   Eyes: Negative for blurred vision.  Respiratory: Negative for cough, shortness of breath and wheezing.   Cardiovascular: Negative for chest pain, orthopnea, leg swelling and PND.  Gastrointestinal: Negative for abdominal pain, constipation, diarrhea, heartburn, nausea and vomiting.  Genitourinary: Negative for dysuria and urgency.  Musculoskeletal: Negative for back pain.  Skin: Negative for rash.  Neurological: Negative for dizziness, speech change, focal weakness and headaches.  Endo/Heme/Allergies: Does not bruise/bleed easily.  Psychiatric/Behavioral: Negative for depression. The patient is nervous/anxious.    DRUG ALLERGIES:   Allergies  Allergen Reactions  . Other Other (See Comments)    Patient states he can't take any "mycin" medications because myasthenia gravis.   VITALS:  Blood pressure 121/71, pulse 85, temperature 97.7 F (36.5 C), temperature source Oral, resp. rate 20, height 5\' 10"  (1.778 m), weight 84 kg (185 lb 3.2 oz), SpO2 97 %. PHYSICAL EXAMINATION:  Physical Exam  Constitutional: He is oriented to person, place, and time and well-developed, well-nourished, and in no distress.  HENT:  Head: Normocephalic and atraumatic.  Eyes: Conjunctivae and EOM are normal. Pupils are equal, round, and reactive to light.  Neck: Normal range of motion. Neck supple. No tracheal deviation present. No thyromegaly present.  Cardiovascular: Normal rate, regular rhythm and normal heart sounds.  Pulmonary/Chest: Effort normal and breath sounds normal.  No respiratory distress. He has no wheezes. He exhibits no tenderness.  Abdominal: Soft. Bowel sounds are normal. He exhibits no distension. There is no tenderness.  Musculoskeletal: Normal range of motion.  Neurological: He is alert and oriented to person, place, and time. No cranial nerve deficit.  Skin: Skin is warm and dry. No rash noted.  Psychiatric: Affect normal. His mood appears anxious.   LABORATORY PANEL:  Male CBC Recent Labs  Lab 06/05/17 0613  WBC 5.7  HGB 9.0*  HCT 28.9*  PLT 315   ------------------------------------------------------------------------------------------------------------------ Chemistries  Recent Labs  Lab 06/05/17 0613  NA 134*  K 4.0  CL 104  CO2 23  GLUCOSE 109*  BUN 16  CREATININE 0.84  CALCIUM 8.4*  AST 24  ALT 20  ALKPHOS 84  BILITOT 0.4   RADIOLOGY:  No results found. ASSESSMENT AND PLAN:  57 y.o. male admitted for tachycardia, tremor, patient states he is in withdrawal.  Patient has been in withdrawal in the past requiring hospitalization.  He states that he is returning from being out of town for some time, and that he has not had anything to drink since earlier this morning but today he feels like he has gone into withdrawal.  Workup in the ED is consistent with the same, hospitalist were called for admission  * Alcohol withdrawal  - counseled and requested to attend AA at D/C - continue CIWA protocol, Librium, patient is interested in looking for a longer term detox when he is ready for discharge (he's interested in going to [place] where there is program from his [place])    * Type II diabetes mellitus with manifestations  - Sliding scale insulin   *  HTN (hypertension) - continue coreg, labetalol, lisinopril  * Anemia of Chronic Dz - stable, Hb 9.9  * Chronic back pain: has seen neurosurgery in past. - outpt referral for 2nd opinion    PT/OT eval   All the records are reviewed and case discussed with Care  Management/Social Worker. Management plans discussed with the patient, nursing and they are in agreement.  CODE STATUS: Full Code  TOTAL TIME TAKING CARE OF THIS PATIENT: 35 minutes.   More than 50% of the time was spent in counseling/coordination of care: YES  POSSIBLE D/C IN 1 DAYS, DEPENDING ON CLINICAL CONDITION.   Delfino LovettVipul Lathan Gieselman M.D on 06/05/2017 at 4:37 PM  Between 7am to 6pm - Pager - [phone]  After 6pm go to [email or web address] - Social research officer, governmentpassword EPAS [facility]  [facility]  Office  [phone]  CC: Primary care physician; Lamar BlinksKowalski, Bruce J, MD  Note: This dictation was prepared with Dragon dictation along with smaller phrase technology. Any transcriptional errors that result from this process are unintentional.

## 2017-06-06 LAB — GLUCOSE, CAPILLARY
GLUCOSE-CAPILLARY: 121 mg/dL — AB (ref 65–99)
Glucose-Capillary: 103 mg/dL — ABNORMAL HIGH (ref 65–99)

## 2017-06-06 NOTE — Plan of Care (Signed)
Patient discharged.

## 2017-06-06 NOTE — Discharge Instructions (Addendum)
Alcohol Withdrawal When a person who drinks a lot of alcohol stops drinking, he or she may go through alcohol withdrawal. Alcohol withdrawal causes problems. It can make you feel:  Tired (fatigued).  Sad (depressed).  Fearful (anxious).  Grouchy (irritable).  Not hungry.  Sick to your stomach (nauseous).  Shaky.  It can also make you have:  Nightmares.  Trouble sleeping.  Trouble thinking clearly.  Mood swings.  Clammy skin.  Very bad sweating.  A very fast heartbeat.  Shaking that you cannot control (tremor).  Having a fever.  A fit of movements that you cannot control (seizure).  Confusion.  Throwing up (vomiting).  Feeling or seeing things that are not there (hallucinations).  Follow these instructions at home:  Take medicines and vitamins only as told by your doctor.  Do not drink alcohol.  Have someone around in case you need help.  Drink enough fluid to keep your pee (urine) clear or pale yellow.  Think about joining a group to help you stop drinking. Contact a doctor if:  Your problems get worse.  Your problems do not go away.  You cannot eat or drink without throwing up.  You are having a hard time not drinking alcohol.  You cannot stop drinking alcohol. Get help right away if:  You feel your heart beating differently than usual.  Your chest hurts.  You have trouble breathing.  You have very bad problems, like: ? A fever. ? A fit of movements that you cannot control. ? Being very confused. ? Feeling or seeing things that are not there. This information is not intended to replace advice given to you by your health care provider. Make sure you discuss any questions you have with your health care provider. Document Released: 10/11/2007 Document Revised: 09/30/2015 Document Reviewed: 02/10/2014 Elsevier Interactive Patient Education  2018 Elsevier Inc.  

## 2017-06-06 NOTE — Evaluation (Signed)
Physical Therapy Evaluation Patient Details Name: Kurt PhenesJohn D Basque MRN: [id number] DOB: [birth year] Today's Date: 06/06/2017   History of Present Illness  Pt. is a 57 y.o. male who [facility] with Alcohol withdrawl. Pt. MPMHx includes: Alcohol abuse, Typ II DM, HTN, Anemia of chronic Disease, Chronic Back pain, Depression, Substance Abuse, Mood Disorder, SVT, ETOH Dependence, Osteomyelitis, NMyasthenis Gravis, Left Toe Amputation.  Clinical Impression  Pt is a pleasant 57 year old male who was admitted for alcohol withdrawl. Pt performs bed mobility with mod I, transfers with Mod I, and ambulation with cga and SPC. Pt demonstrates deficits with strength/balance/chronic back pain. Recommended pt wear supportive shoes (just in hospital grip socks at this time) for improved proprioceptive ability and decrease risk of falls. Also recommended possible RW use for improved balance, however pt reports he is not interested, only wants to use SPC. L LE appears weaker compared to R LE. Pt is at high risk for falls. Would benefit from skilled PT to address above deficits and promote optimal return to PLOF. Recommend OP PT to address deficits after recovery from alcohol rehab.      Follow Up Recommendations Outpatient PT    Equipment Recommendations  None recommended by PT    Recommendations for Other Services       Precautions / Restrictions Precautions Precautions: Fall Restrictions Weight Bearing Restrictions: No      Mobility  Bed Mobility Overal bed mobility: Modified Independent             General bed mobility comments: uses railing to get out of bed, however safe technique performed.  Transfers Overall transfer level: Modified independent Equipment used: Straight cane             General transfer comment: upright posture noted with SPC. Wide base of support  Ambulation/Gait Ambulation/Gait assistance: Min guard Ambulation Distance (Feet): 40 Feet Assistive  device: Straight cane Gait Pattern/deviations: Step-to pattern     General Gait Details: ambulated using SPC with broad base of support and slow speed. UPright posture noted and slight unsteadiness reported due to B foot numbness.  Stairs            Wheelchair Mobility    Modified Rankin (Stroke Patients Only)       Balance Overall balance assessment: Needs assistance;History of Falls Sitting-balance support: No upper extremity supported Sitting balance-Leahy Scale: Normal     Standing balance support: Single extremity supported Standing balance-Leahy Scale: Fair                               Pertinent Vitals/Pain Pain Assessment: Faces Pain Score: 5 (Upon arrival pt. reported he was waiting for his nurse to bring him morphine.) Faces Pain Scale: Hurts even more Pain Location: Back Pain Descriptors / Indicators: Aching;Constant Pain Intervention(s): Limited activity within patient's tolerance(pt reporting he is waiting on pain meds)    Home Living Family/patient expects to be discharged to:: Private residence Living Arrangements: Parent Available Help at Discharge: Family Type of Home: House Home Access: Stairs to enter Entrance Stairs-Rails: Can reach both Entrance Stairs-Number of Steps: 2 Home Layout: One level Home Equipment: Cane - single point;Tub bench      Prior Function Level of Independence: Needs assistance      ADL's / Homemaking Assistance Needed: Pt. reports indepndence with with daily ADL care, light meal preparation. Pt. has chronic back pain. which limits activity. Pt. is currently on disability,  and has not been driving.  Comments: reports he uses SPC for all mobility, does endorse several falls     Hand Dominance   Dominant Hand: Right    Extremity/Trunk Assessment   Upper Extremity Assessment Upper Extremity Assessment: Generalized weakness(grossly 4/5)    Lower Extremity Assessment Lower Extremity Assessment:  Generalized weakness(L LE grossly 4/5; R LE grossly 4+/5; B foot numbness)       Communication   Communication: No difficulties  Cognition Arousal/Alertness: Awake/alert Behavior During Therapy: WFL for tasks assessed/performed Overall Cognitive Status: Within Functional Limits for tasks assessed                                        General Comments      Exercises Other Exercises Other Exercises: Pt able to ambulated to bathroom and use toliet with supervision. Safe technique noted with proper hygiene.   Assessment/Plan    PT Assessment Patient needs continued PT services  PT Problem List Decreased strength;Decreased balance;Decreased mobility;Decreased safety awareness;Pain       PT Treatment Interventions Gait training;DME instruction;Therapeutic exercise;Balance training;Therapeutic activities    PT Goals (Current goals can be found in the Care Plan section)  Acute Rehab PT Goals Patient Stated Goal: To go to rehab in [place] for Recovery PT Goal Formulation: With patient Time For Goal Achievement: 06/20/17 Potential to Achieve Goals: Good    Frequency Min 2X/week   Barriers to discharge        Co-evaluation               AM-PAC PT "6 Clicks" Daily Activity  Outcome Measure Difficulty turning over in bed (including adjusting bedclothes, sheets and blankets)?: None Difficulty moving from lying on back to sitting on the side of the bed? : None Difficulty sitting down on and standing up from a chair with arms (e.g., wheelchair, bedside commode, etc,.)?: A Little Help needed moving to and from a bed to chair (including a wheelchair)?: A Little Help needed walking in hospital room?: A Little Help needed climbing 3-5 steps with a railing? : A Little 6 Click Score: 20    End of Session Equipment Utilized During Treatment: Gait belt Activity Tolerance: Patient tolerated treatment well Patient left: in bed;with bed alarm set Nurse  Communication: Mobility status PT Visit Diagnosis: Unsteadiness on feet (R26.81);Repeated falls (R29.6);Muscle weakness (generalized) (M62.81);Difficulty in walking, not elsewhere classified (R26.2);Pain Pain - part of body: (back)    Time: 4098-1191 PT Time Calculation (min) (ACUTE ONLY): 16 min   Charges:   PT Evaluation $PT Eval Low Complexity: 1 Low PT Treatments $Therapeutic Activity: 8-22 mins   PT G CodesElizabeth Palau, PT, DPT [phone]   Tiffany Talarico 06/06/2017, 12:28 PM

## 2017-06-06 NOTE — Progress Notes (Signed)
Pt refuses bed alarm and educated. Pt verbalized that he will call if needs to get OOB.

## 2017-06-06 NOTE — Evaluation (Signed)
Occupational Therapy Evaluation Patient Details Name: Kurt Horton MRN: [id number] DOB: 12-Sep-1960 Today's Date: 06/06/2017    History of Present Illness Pt. is a 57 y.o. male who was admitted to [facility] with Alcohol withdrawl. Pt. MPMHx includes: Alcohol abuse, Typ II DM, HTN, Anemia of chronic Disease, Chronic Back pain, Depression, Substance Abuse, Mood Disorder, SVT, ETOH Dependence, Osteomyelitis, NMyasthenis Gravis, Left Toe Amputation.   Clinical Impression   Pt. was admitted to [facility] with Alcohol withdrawal. Pt. Presents with back pain, and tremors from withdrawal. Pt. Resides with his parents. Pt. Was independent with daily ADL care at home. Pt. Was able to perform light meal preparation. Pt. Is on disability, and does not drive currently. Pt. Education was provided with education about A/E use for LE ADLs. Pt. Reports being familiar with the equipment from previous  Hip surgery, and has a reacher at home. Pt. Plans to go to a a rehab facility in [place] for Recovery secondary to Alcohol Abuse. Follow-up OT services are not warranted at this time.    Follow Up Recommendations  No OT follow up    Equipment Recommendations       Recommendations for Other Services Other (comment)(Pt. plans to go to Rehab in [place].)     Precautions / Restrictions Restrictions Weight Bearing Restrictions: No                                                    ADL either performed or assessed with clinical judgement   ADL Overall ADL's : Needs assistance/impaired Eating/Feeding: Set up;Minimal assistance;Independent;Bed level(depending on tremors)   Grooming: Set up;Independent;Min guard;Bed level   Upper Body Bathing: Set up;Independent   Lower Body Bathing: Set up;Min guard   Upper Body Dressing : Set up;Min guard   Lower Body Dressing: Min guard                 General ADL Comments: Pt. education was provided about A/E use for LE ADLs.     Vision          Perception     Praxis      Pertinent Vitals/Pain Pain Assessment: Faces Pain Score: 5 (Upon arrival pt. reported he was waiting for his nurse to bring him morphine.) Faces Pain Scale: Hurts even more Pain Location: Back Pain Intervention(s): Limited activity within patient's tolerance;Monitored during session     Hand Dominance Right   Extremity/Trunk Assessment Upper Extremity Assessment Upper Extremity Assessment: Generalized weakness           Communication Communication Communication: No difficulties   Cognition Arousal/Alertness: Awake/alert Behavior During Therapy: WFL for tasks assessed/performed Overall Cognitive Status: Within Functional Limits for tasks assessed                                     General Comments       Exercises     Shoulder Instructions      Home Living Family/patient expects to be discharged to:: Private residence Living Arrangements: Parent Available Help at Discharge: Family Type of Home: House Home Access: Stairs to enter Secretary/administrator of Steps: 2   Home Layout: One level     Bathroom Shower/Tub: Tub/shower unit;Curtain(There is also a Tourist information centre manager with a door. Pt. mostly uses the tub.)  Home Equipment: Gilmer MorCane - single point;Tub bench          Prior Functioning/Environment Level of Independence: Needs assistance    ADL's / Homemaking Assistance Needed: Pt. reports independence with daily ADL care, light meal preparation. Pt. has chronic back pain, which limits activity. Pt. is currently on disability, and has not been driving.            OT Problem List:        OT Treatment/Interventions: Self-care/ADL training    OT Goals(Current goals can be found in the care plan section) Acute Rehab OT Goals Patient Stated Goal: [place] OT Goal Formulation: With patient Potential to Achieve Goals: Good  OT Frequency: Min 1X/week   Barriers to  D/C:            Co-evaluation              AM-PAC PT "6 Clicks" Daily Activity     Outcome Measure Help from another person eating meals?: A Little Help from another person taking care of personal grooming?: A Little Help from another person toileting, which includes using toliet, bedpan, or urinal?: A Little Help from another person bathing (including washing, rinsing, drying)?: A Little Help from another person to put on and taking off regular upper body clothing?: None Help from another person to put on and taking off regular lower body clothing?: A Little 6 Click Score: 19   End of Session Equipment Utilized During Treatment: Gait belt  Activity Tolerance: Patient tolerated treatment well Patient left: in bed  OT Visit Diagnosis: Muscle weakness (generalized) (M62.81);Pain                Time: 1010-1025 OT Time Calculation (min): 15 min Charges:  OT General Charges $OT Visit: 1 Visit OT Evaluation $OT Eval Low Complexity: 1 Low G-Codes:     Olegario MessierElaine Bentleigh Waren, MS, OTR/L   Olegario MessierElaine Tawan Corkern, MS, OTR/L 06/06/2017, 10:49 AM

## 2017-06-07 ENCOUNTER — Emergency Department
Admission: EM | Admit: 2017-06-07 | Discharge: 2017-06-08 | Disposition: A | Discharge status: Home / Self Care | Payer: Medicaid Other | Attending: Student in an Organized Health Care Education/Training Program | Admitting: Student in an Organized Health Care Education/Training Program

## 2017-06-07 ENCOUNTER — Encounter: Payer: Self-pay | Admitting: *Deleted

## 2017-06-07 ENCOUNTER — Emergency Department: Payer: Medicaid Other

## 2017-06-07 DIAGNOSIS — Z7984 Long term (current) use of oral hypoglycemic drugs: Secondary | ICD-10-CM | POA: Diagnosis not present

## 2017-06-07 DIAGNOSIS — I10 Essential (primary) hypertension: Secondary | ICD-10-CM | POA: Insufficient documentation

## 2017-06-07 DIAGNOSIS — F10920 Alcohol use, unspecified with intoxication, uncomplicated: Secondary | ICD-10-CM | POA: Insufficient documentation

## 2017-06-07 DIAGNOSIS — R4182 Altered mental status, unspecified: Secondary | ICD-10-CM | POA: Diagnosis not present

## 2017-06-07 DIAGNOSIS — Z79899 Other long term (current) drug therapy: Secondary | ICD-10-CM | POA: Diagnosis not present

## 2017-06-07 DIAGNOSIS — F17210 Nicotine dependence, cigarettes, uncomplicated: Secondary | ICD-10-CM | POA: Insufficient documentation

## 2017-06-07 DIAGNOSIS — E1129 Type 2 diabetes mellitus with other diabetic kidney complication: Secondary | ICD-10-CM | POA: Insufficient documentation

## 2017-06-07 DIAGNOSIS — F1010 Alcohol abuse, uncomplicated: Secondary | ICD-10-CM

## 2017-06-07 LAB — COMPREHENSIVE METABOLIC PANEL
ALBUMIN: 3.8 g/dL (ref 3.5–5.0)
ALT: 19 U/L (ref 17–63)
AST: 21 U/L (ref 15–41)
Alkaline Phosphatase: 75 U/L (ref 38–126)
Anion gap: 12 (ref 5–15)
BILIRUBIN TOTAL: 0.5 mg/dL (ref 0.3–1.2)
BUN: 7 mg/dL (ref 6–20)
CHLORIDE: 100 mmol/L — AB (ref 101–111)
CO2: 25 mmol/L (ref 22–32)
Calcium: 8.9 mg/dL (ref 8.9–10.3)
Creatinine, Ser: 0.73 mg/dL (ref 0.61–1.24)
GFR calc Af Amer: >60 mL/min (ref >60)
GFR calc non Af Amer: >60 mL/min (ref >60)
GLUCOSE: 109 mg/dL — AB (ref 65–99)
POTASSIUM: 3.5 mmol/L (ref 3.5–5.1)
SODIUM: 137 mmol/L (ref 135–145)
TOTAL PROTEIN: 6.8 g/dL (ref 6.5–8.1)

## 2017-06-07 LAB — CBC WITH DIFFERENTIAL/PLATELET
BASOS ABS: 0.1 10*3/uL (ref 0–0.1)
BASOS PCT: 2 %
EOS ABS: 0.1 10*3/uL (ref 0–0.7)
EOS PCT: 2 %
HEMATOCRIT: 35.8 % — AB (ref 40.0–52.0)
Hemoglobin: 11.1 g/dL — ABNORMAL LOW (ref 13.0–18.0)
Lymphocytes Relative: 28 %
Lymphs Abs: 1.5 10*3/uL (ref 1.0–3.6)
MCH: 24.9 pg — ABNORMAL LOW (ref 26.0–34.0)
MCHC: 31 g/dL — ABNORMAL LOW (ref 32.0–36.0)
MCV: 80.5 fL (ref 80.0–100.0)
MONO ABS: 0.5 10*3/uL (ref 0.2–1.0)
MONOS PCT: 9 %
Neutro Abs: 3.3 10*3/uL (ref 1.4–6.5)
Neutrophils Relative %: 59 %
PLATELETS: 309 10*3/uL (ref 150–440)
RBC: 4.45 MIL/uL (ref 4.40–5.90)
RDW: 23.8 % — AB (ref 11.5–14.5)
WBC: 5.6 10*3/uL (ref 3.8–10.6)

## 2017-06-07 LAB — ACETAMINOPHEN LEVEL: Acetaminophen (Tylenol), Serum: <10 ug/mL — ABNORMAL LOW (ref 10–30)

## 2017-06-07 LAB — SALICYLATE LEVEL: Salicylate Lvl: <7 mg/dL (ref 2.8–30.0)

## 2017-06-07 MED ORDER — SODIUM CHLORIDE 0.9 % IV BOLUS (SEPSIS)
1000.0000 mL | Freq: Once | INTRAVENOUS | Status: AC
  Administered 2017-06-07: 1000 mL via INTRAVENOUS

## 2017-06-07 NOTE — ED Notes (Signed)
Helped pt to the bathroom to void. Pt voided a large amount. Pt was helped to the bathroom in the wheelchair.  Pt appeared quite unsteady on his feet.  Gave pt something to drink and a meal tray.  Pt consumed both and noted that he was very hungry as he had not had any food all day.

## 2017-06-07 NOTE — ED Notes (Signed)
Pt states he is having seizures but they are intermittent when RN is present ranging from obvious in the left arm only, to none at all visable.

## 2017-06-07 NOTE — ED Provider Notes (Signed)
[facility] Emergency Department Provider Note    First MD Initiated Contact with Patient 06/07/17 1854     (approximate)  I have reviewed the triage vital signs and the nursing notes.   HISTORY  Chief Complaint Alcohol Intoxication    HPI Kurt Horton is a 57 y.o. male history of abuse as well as diabetes and myasthenia gravis presents to the ER acutely intoxicated by EMS.  Patient was reportedly just released from rehab family states that he was at the dinner table and otherwise acting normal.  Cystic ovarian did not appear a little unsteady when he got to his room.  Father came to check on him is found on the floor intoxicated drowsy.  Patient cannot recall if he fell and hit his head.  Patient does endorse drinking "a whole bunch of rum."  He states that the roughly 9-10 empty airplane bottles of Bacardi rum.  Patient denies any homicidal ideation or suicidal ideation.  No intent for self-harm.  Past Medical History:  Diagnosis Date  . Alcohol abuse   . Alcohol withdrawal (HCC) 11/12/2016  . Diabetes mellitus without complication (HCC)   . DJD (degenerative joint disease)   . Hypertension   . Myasthenia gravis (HCC)   . Renal disorder    Family History  Problem Relation Age of Onset  . Rheum arthritis Father    Past Surgical History:  Procedure Laterality Date  . AMPUTATION TOE Left 12/29/2015   Procedure: AMPUTATION TOE;  Surgeon: Gwyneth Revels, DPM;  Location: [facility] ORS;  Service: Podiatry;  Laterality: Left;  . PERIPHERAL VASCULAR CATHETERIZATION N/A 12/27/2015   Procedure: Lower Extremity Angiography;  Surgeon: Annice Needy, MD;  Location: [facility] INVASIVE CV LAB;  Service: Cardiovascular;  Laterality: N/A;   Patient Active Problem List   Diagnosis Date Noted  . Alcohol withdrawal delirium (HCC) 02/26/2017  . Atrial fibrillation with RVR (HCC) 02/25/2017  . Acute alcoholic intoxication with complication (HCC)   . Alcohol abuse   . Alcohol  withdrawal (HCC) 11/13/2016  . Substance induced mood disorder (HCC) 11/13/2016  . SVT (supraventricular tachycardia) (HCC) 11/12/2016  . HTN (hypertension) 11/12/2016  . EtOH dependence (HCC) 11/12/2016  . Lumbar radiculopathy 08/24/2016  . Depression with anxiety 08/24/2016  . Chronic low back pain 01/04/2016  . Pressure ulcer 12/29/2015  . Osteomyelitis (HCC) 12/26/2015  . PVD (peripheral vascular disease) (HCC) 12/26/2015  . Type II diabetes mellitus with manifestations (HCC) 12/26/2015  . Myasthenia gravis (HCC) 12/26/2015      Prior to Admission medications   Medication Sig Start Date End Date Taking? Authorizing Provider  B Complex-C (B-COMPLEX WITH VITAMIN C) tablet Take 1 tablet by mouth daily.   Yes [provider]  citalopram (CELEXA) 20 MG tablet Take 1 tablet (20 mg total) by mouth daily. 08/24/16  Yes Sater, Pearletha Furl, MD  furosemide (LASIX) 40 MG tablet Take 40 mg by mouth daily as needed for fluid (if taking prednisone).    Yes [provider]  gabapentin (NEURONTIN) 300 MG capsule Take 1 capsule (300 mg total) by mouth 3 (three) times daily. Patient taking differently: Take 600 mg 3 (three) times daily by mouth.  03/01/17  Yes Enid Baas, MD  lisinopril (PRINIVIL,ZESTRIL) 10 MG tablet Take 1 tablet (10 mg total) by mouth daily. 12/30/15  Yes Wieting, Richard, MD  metFORMIN (GLUCOPHAGE) 500 MG tablet Take 1 tablet (500 mg total) by mouth daily. 12/30/15  Yes Alford Highland, MD  mycophenolate (CELLCEPT) 500 MG  tablet Take 500-1,000 mg by mouth 2 (two) times daily. Take 1 tablet by mouth in the morning and 2 tablets at night   Yes [provider]  carvedilol (COREG) 6.25 MG tablet Take 1 tablet (6.25 mg total) by mouth 2 (two) times daily with a meal. Patient not taking: Reported on 06/07/2017 03/01/17   Enid BaasKalisetti, Radhika, MD    Allergies Other    Social History Social History   Tobacco Use  . Smoking status: Current Every Day  Smoker    Packs/day: 0.50    Types: Cigarettes  . Smokeless tobacco: Never Used  Substance Use Topics  . Alcohol use: Yes    Comment: Pt had tequila and rum, at least a 5th  . Drug use: No    Review of Systems Patient denies headaches, rhinorrhea, blurry vision, numbness, shortness of breath, chest pain, edema, cough, abdominal pain, nausea, vomiting, diarrhea, dysuria, fevers, rashes or hallucinations unless otherwise stated above in HPI. ____________________________________________   PHYSICAL EXAM:  VITAL SIGNS: Vitals:   06/07/17 1858 06/07/17 2259  BP: 115/79 101/64  Pulse: 72 95  Resp: 20 12  Temp: 98.5 F (36.9 C) 98.3 F (36.8 C)  SpO2: 98% 98%    Constitutional: Drowsy and intoxicated but protecting his airway, responds to verbal stimuli and can follow two-step commands.  no acute distress. Eyes: Conjunctivae are normal.  Head: Atraumatic. Nose: No congestion/rhinnorhea. Mouth/Throat: Mucous membranes are moist.   Neck: No stridor. Painless ROM.  Cardiovascular: Normal rate, regular rhythm. Grossly normal heart sounds.  Good peripheral circulation. Respiratory: Normal respiratory effort.  No retractions. Lungs CTAB. Gastrointestinal: Soft and nontender. No distention. No abdominal bruits. No CVA tenderness. Genitourinary:  Musculoskeletal: No lower extremity tenderness nor edema.  No joint effusions. Neurologic:  Slurred speech, normal language. No gross focal neurologic deficits are appreciated. No facial droop Skin:  Skin is warm, dry and intact. No rash noted. Psychiatric:intoxicated ____________________________________________   LABS (all labs ordered are listed, but only abnormal results are displayed)  Results for orders placed or performed during the hospital encounter of 06/07/17 (from the past 24 hour(s))  CBC with Differential/Platelet     Status: Abnormal   Collection Time: 06/07/17  6:56 PM  Result Value Ref Range   WBC 5.6 3.8 - 10.6 K/uL    RBC 4.45 4.40 - 5.90 MIL/uL   Hemoglobin 11.1 (L) 13.0 - 18.0 g/dL   HCT 19.135.8 (L) 47.840.0 - 29.552.0 %   MCV 80.5 80.0 - 100.0 fL   MCH 24.9 (L) 26.0 - 34.0 pg   MCHC 31.0 (L) 32.0 - 36.0 g/dL   RDW 62.123.8 (H) 30.811.5 - 65.714.5 %   Platelets 309 150 - 440 K/uL   Neutrophils Relative % 59 %   Neutro Abs 3.3 1.4 - 6.5 K/uL   Lymphocytes Relative 28 %   Lymphs Abs 1.5 1.0 - 3.6 K/uL   Monocytes Relative 9 %   Monocytes Absolute 0.5 0.2 - 1.0 K/uL   Eosinophils Relative 2 %   Eosinophils Absolute 0.1 0 - 0.7 K/uL   Basophils Relative 2 %   Basophils Absolute 0.1 0 - 0.1 K/uL  Comprehensive metabolic panel     Status: Abnormal   Collection Time: 06/07/17  6:56 PM  Result Value Ref Range   Sodium 137 135 - 145 mmol/L   Potassium 3.5 3.5 - 5.1 mmol/L   Chloride 100 (L) 101 - 111 mmol/L   CO2 25 22 - 32 mmol/L   Glucose, Bld 109 (  H) 65 - 99 mg/dL   BUN 7 6 - 20 mg/dL   Creatinine, Ser 1.61 0.61 - 1.24 mg/dL   Calcium 8.9 8.9 - 09.6 mg/dL   Total Protein 6.8 6.5 - 8.1 g/dL   Albumin 3.8 3.5 - 5.0 g/dL   AST 21 15 - 41 U/L   ALT 19 17 - 63 U/L   Alkaline Phosphatase 75 38 - 126 U/L   Total Bilirubin 0.5 0.3 - 1.2 mg/dL   GFR calc non Af Amer >60 >60 mL/min   GFR calc Af Amer >60 >60 mL/min   Anion gap 12 5 - 15  Acetaminophen level     Status: Abnormal   Collection Time: 06/07/17  6:56 PM  Result Value Ref Range   Acetaminophen (Tylenol), Serum <10 (L) 10 - 30 ug/mL  Salicylate level     Status: None   Collection Time: 06/07/17  6:56 PM  Result Value Ref Range   Salicylate Lvl <7.0 2.8 - 30.0 mg/dL   ____________________________________________  EKG My review and personal interpretation at Time: 19:29   Indication: intoxication  Rate: 75  Rhythm: sinus Axis: left Other: normal intervals, no stemi ____________________________________________  RADIOLOGY  I personally reviewed all radiographic images ordered to evaluate for the above acute complaints and reviewed radiology reports and  findings.  These findings were personally discussed with the patient.  Please see medical record for radiology report.  ____________________________________________   PROCEDURES  Procedure(s) performed:  Procedures    Critical Care performed: no ____________________________________________   INITIAL IMPRESSION / ASSESSMENT AND PLAN / ED COURSE  Pertinent labs & imaging results that were available during my care of the patient were reviewed by me and considered in my medical decision making (see chart for details).  DDX: Dehydration, sepsis, pna, uti, hypoglycemia, cva, drug effect, withdrawal, encephalitis   Kurt Horton is a 57 y.o. who presents to the ED with acute etoh intoxication..  Impression no evidence of overdose.  Will observe in the ER.  Will give IV fluids.    Clinical Course as of Jun 08 2323  Thu Jun 07, 2017  2041 Blood work and CT imaging reassuring.  Patient remains Heema dynamically stable.  Will be observed in the ER to evaluate for clinical sobriety.  [PR]    Clinical Course User Index [PR] Willy Eddy, MD   Staff.  Patient seems to be improving clinically.  We will observe until clinically sober.  Patient was signed out to oncoming physician.  ____________________________________________   FINAL CLINICAL IMPRESSION(S) / ED DIAGNOSES  Final diagnoses:  Alcoholic intoxication without complication (HCC)  Alcohol abuse      NEW MEDICATIONS STARTED DURING THIS VISIT:  New Prescriptions   No medications on file     Note:  This document was prepared using Dragon voice recognition software and may include unintentional dictation errors.    Willy Eddy, MD 06/07/17 [phone]

## 2017-06-07 NOTE — ED Triage Notes (Signed)
EMS reports that that pt was found by his father with ETOH intoxication and wanted him brought in "to get help".  Pt is intoxicated, reports that he "drank a lot of rum" but is arousable and oriented and cooperative and able to answer questions.  Pt had recent treatment for ETOH.

## 2017-06-08 NOTE — ED Notes (Signed)
Pt given phone to call [facility] about detox and call father to notify him that he is on the way home.

## 2017-06-09 NOTE — Discharge Summary (Signed)
[facility] [place] [facility]   PATIENT NAME: Kurt Horton    MR#:  [id number]  DATE OF BIRTH:  [birth year]  DATE OF ADMISSION:  06/03/2017   ADMITTING PHYSICIAN: Oralia Manis, MD  DATE OF DISCHARGE: 06/06/2017  1:55 PM  PRIMARY CARE PHYSICIAN: [name], MD   ADMISSION DIAGNOSIS:  Alcohol withdrawal syndrome with complication (HCC) [F10.239] DISCHARGE DIAGNOSIS:  Principal Problem:   Alcohol withdrawal (HCC) Active Problems:   Type II diabetes mellitus with manifestations (HCC)   HTN (hypertension)   Alcohol abuse  SECONDARY DIAGNOSIS:   Past Medical History:  Diagnosis Date  . Alcohol abuse   . Alcohol withdrawal (HCC) 11/12/2016  . Diabetes mellitus without complication (HCC)   . DJD (degenerative joint disease)   . Hypertension   . Myasthenia gravis (HCC)   . Renal disorder    HOSPITAL COURSE:  57 y.o.maleadmitted for tachycardia, tremor and was admitted for alcohol withdrawal  * Alcohol withdrawal  - counseled and requested to attend AA at D/C - continue CIWA protocol, Librium, patient is interested in looking for a longer term detox when he is ready for discharge (he's interested in going to [place] where there is program from his [employer])    *Type II diabetes mellitus  *HTN (hypertension) - continue coreg, labetalol, lisinopril  * Anemia of Chronic Dz - stable, Hb 9.9  * Chronic back pain: has seen neurosurgery in past. - outpt referral for 2nd opinion if he desires DISCHARGE CONDITIONS:  stable CONSULTS OBTAINED:  Treatment Team:  Clapacs, Jackquline Denmark, MD DRUG ALLERGIES:   Allergies  Allergen Reactions  . Other Other (See Comments)    Patient states he can't take any "mycin" medications because myasthenia gravis.   DISCHARGE MEDICATIONS:   Allergies as of 06/06/2017      Reactions   Other Other (See Comments)   Patient states he can't take any "mycin" medications because myasthenia gravis.      Medication List    TAKE these medications   carvedilol 6.25 MG tablet Commonly known as:  COREG Take 1 tablet (6.25 mg total) by mouth 2 (two) times daily with a meal.   citalopram 20 MG tablet Commonly known as:  CELEXA Take 1 tablet (20 mg total) by mouth daily.   furosemide 40 MG tablet Commonly known as:  LASIX Take 40 mg by mouth daily as needed for fluid (if taking prednisone).   gabapentin 300 MG capsule Commonly known as:  NEURONTIN Take 1 capsule (300 mg total) by mouth 3 (three) times daily. What changed:  how much to take   lisinopril 10 MG tablet Commonly known as:  PRINIVIL,ZESTRIL Take 1 tablet (10 mg total) by mouth daily.   metFORMIN 500 MG tablet Commonly known as:  GLUCOPHAGE Take 1 tablet (500 mg total) by mouth daily.        DISCHARGE INSTRUCTIONS:   DIET:  Regular diet DISCHARGE CONDITION:  Good ACTIVITY:  Activity as tolerated OXYGEN:  Home Oxygen: No.  Oxygen Delivery: room air DISCHARGE LOCATION:  home   If you experience worsening of your admission symptoms, develop shortness of breath, life threatening emergency, suicidal or homicidal thoughts you must seek medical attention immediately by calling 911 or calling your MD immediately  if symptoms less severe.  You Must read complete instructions/literature along with all the possible adverse reactions/side effects for all the Medicines you take and that have been prescribed to you. Take any new Medicines after you have completely understood  and accpet all the possible adverse reactions/side effects.   Please note  You were cared for by a hospitalist during your hospital stay. If you have any questions about your discharge medications or the care you received while you were in the hospital after you are discharged, you can call the unit and asked to speak with the hospitalist on call if the hospitalist that took care of you is not available. Once you are discharged, your primary care  physician will handle any further medical issues. Please note that NO REFILLS for any discharge medications will be authorized once you are discharged, as it is imperative that you return to your primary care physician (or establish a relationship with a primary care physician if you do not have one) for your aftercare needs so that they can reassess your need for medications and monitor your lab values.    On the day of Discharge:  VITAL SIGNS:  Blood pressure (!) 119/56, pulse 82, temperature 98.1 F (36.7 C), temperature source Oral, resp. rate 18, height 5\' 10"  (1.778 m), weight 84 kg (185 lb 3.2 oz), SpO2 98 %. PHYSICAL EXAMINATION:  GENERAL:  57 y.o.-year-old patient lying in the bed with no acute distress.  EYES: Pupils equal, round, reactive to light and accommodation. No scleral icterus. Extraocular muscles intact.  HEENT: Head atraumatic, normocephalic. Oropharynx and nasopharynx clear.  NECK:  Supple, no jugular venous distention. No thyroid enlargement, no tenderness.  LUNGS: Normal breath sounds bilaterally, no wheezing, rales,rhonchi or crepitation. No use of accessory muscles of respiration.  CARDIOVASCULAR: S1, S2 normal. No murmurs, rubs, or gallops.  ABDOMEN: Soft, non-tender, non-distended. Bowel sounds present. No organomegaly or mass.  EXTREMITIES: No pedal edema, cyanosis, or clubbing.  NEUROLOGIC: Cranial nerves II through XII are intact. Muscle strength 5/5 in all extremities. Sensation intact. Gait not checked.  PSYCHIATRIC: The patient is alert and oriented x 3.  SKIN: No obvious rash, lesion, or ulcer.  DATA REVIEW:   CBC Recent Labs  Lab 06/07/17 1856  WBC 5.6  HGB 11.1*  HCT 35.8*  PLT 309    Chemistries  Recent Labs  Lab 06/07/17 1856  NA 137  K 3.5  CL 100*  CO2 25  GLUCOSE 109*  BUN 7  CREATININE 0.73  CALCIUM 8.9  AST 21  ALT 19  ALKPHOS 75  BILITOT 0.5    Follow-up Information    Barbette ReichmannHande, Vishwanath, MD. Go on 06/11/2017.   Specialty:   Internal Medicine Why:  Please got to your appointment on 06/11/2017 at 8:15am Contact information: [address] [facility] [place] [address] [phone]            Management plans discussed with the patient, family and they are in agreement.  CODE STATUS: Prior   TOTAL TIME TAKING CARE OF THIS PATIENT: 45 minutes.    Delfino LovettVipul Chandlor Noecker M.D on 06/09/2017 at 2:45 PM  Between 7am to 6pm - Pager - [phone]  After 6pm go to [email or web address] - Social research officer, governmentpassword [email or web address]  [facility] [place] [facility]  Office  [phone]  CC: Primary care physician; Barbette ReichmannHande, Vishwanath, MD   Note: This dictation was prepared with Dragon dictation along with smaller phrase technology. Any transcriptional errors that result from this process are unintentional.

## 2017-06-22 ENCOUNTER — Emergency Department (HOSPITAL_COMMUNITY)
Admission: EM | Admit: 2017-06-22 | Discharge: 2017-06-23 | Disposition: A | Discharge status: Home / Self Care | Payer: Medicaid Other | Attending: Emergency Medicine | Admitting: Emergency Medicine

## 2017-06-22 ENCOUNTER — Other Ambulatory Visit: Payer: Self-pay

## 2017-06-22 ENCOUNTER — Telehealth: Payer: Self-pay | Admitting: Neurology

## 2017-06-22 ENCOUNTER — Emergency Department (HOSPITAL_COMMUNITY): Payer: Medicaid Other

## 2017-06-22 ENCOUNTER — Encounter (HOSPITAL_COMMUNITY): Payer: Self-pay | Admitting: *Deleted

## 2017-06-22 DIAGNOSIS — Z7984 Long term (current) use of oral hypoglycemic drugs: Secondary | ICD-10-CM | POA: Insufficient documentation

## 2017-06-22 DIAGNOSIS — M545 Low back pain: Secondary | ICD-10-CM | POA: Diagnosis present

## 2017-06-22 DIAGNOSIS — G8929 Other chronic pain: Secondary | ICD-10-CM

## 2017-06-22 DIAGNOSIS — Z79899 Other long term (current) drug therapy: Secondary | ICD-10-CM | POA: Diagnosis not present

## 2017-06-22 DIAGNOSIS — E119 Type 2 diabetes mellitus without complications: Secondary | ICD-10-CM | POA: Insufficient documentation

## 2017-06-22 DIAGNOSIS — R202 Paresthesia of skin: Secondary | ICD-10-CM | POA: Diagnosis not present

## 2017-06-22 DIAGNOSIS — M5440 Lumbago with sciatica, unspecified side: Secondary | ICD-10-CM | POA: Insufficient documentation

## 2017-06-22 DIAGNOSIS — I10 Essential (primary) hypertension: Secondary | ICD-10-CM | POA: Insufficient documentation

## 2017-06-22 DIAGNOSIS — F17210 Nicotine dependence, cigarettes, uncomplicated: Secondary | ICD-10-CM | POA: Insufficient documentation

## 2017-06-22 DIAGNOSIS — M5136 Other intervertebral disc degeneration, lumbar region: Secondary | ICD-10-CM | POA: Diagnosis not present

## 2017-06-22 DIAGNOSIS — R200 Anesthesia of skin: Secondary | ICD-10-CM | POA: Diagnosis not present

## 2017-06-22 DIAGNOSIS — R159 Full incontinence of feces: Secondary | ICD-10-CM | POA: Diagnosis not present

## 2017-06-22 DIAGNOSIS — R32 Unspecified urinary incontinence: Secondary | ICD-10-CM | POA: Diagnosis not present

## 2017-06-22 DIAGNOSIS — R531 Weakness: Secondary | ICD-10-CM | POA: Insufficient documentation

## 2017-06-22 LAB — COMPREHENSIVE METABOLIC PANEL
ALBUMIN: 3.3 g/dL — AB (ref 3.5–5.0)
ALT: 24 U/L (ref 17–63)
AST: 55 U/L — ABNORMAL HIGH (ref 15–41)
Alkaline Phosphatase: 69 U/L (ref 38–126)
Anion gap: 12 (ref 5–15)
BUN: 10 mg/dL (ref 6–20)
CHLORIDE: 102 mmol/L (ref 101–111)
CO2: 21 mmol/L — ABNORMAL LOW (ref 22–32)
Calcium: 8.7 mg/dL — ABNORMAL LOW (ref 8.9–10.3)
Creatinine, Ser: 0.86 mg/dL (ref 0.61–1.24)
GFR calc Af Amer: >60 mL/min (ref >60)
GFR calc non Af Amer: >60 mL/min (ref >60)
GLUCOSE: 182 mg/dL — AB (ref 65–99)
POTASSIUM: 3.3 mmol/L — AB (ref 3.5–5.1)
Sodium: 135 mmol/L (ref 135–145)
Total Bilirubin: 0.5 mg/dL (ref 0.3–1.2)
Total Protein: 6 g/dL — ABNORMAL LOW (ref 6.5–8.1)

## 2017-06-22 LAB — CBC
HEMATOCRIT: 30.3 % — AB (ref 39.0–52.0)
Hemoglobin: 9.1 g/dL — ABNORMAL LOW (ref 13.0–17.0)
MCH: 25.3 pg — AB (ref 26.0–34.0)
MCHC: 30 g/dL (ref 30.0–36.0)
MCV: 84.4 fL (ref 78.0–100.0)
Platelets: 331 10*3/uL (ref 150–400)
RBC: 3.59 MIL/uL — ABNORMAL LOW (ref 4.22–5.81)
RDW: 20.7 % — AB (ref 11.5–15.5)
WBC: 6.1 10*3/uL (ref 4.0–10.5)

## 2017-06-22 MED ORDER — GADOBENATE DIMEGLUMINE 529 MG/ML IV SOLN
19.0000 mL | Freq: Once | INTRAVENOUS | Status: AC
  Administered 2017-06-22: 19 mL via INTRAVENOUS

## 2017-06-22 MED ORDER — ONDANSETRON HCL 4 MG/2ML IJ SOLN
4.0000 mg | Freq: Once | INTRAMUSCULAR | Status: AC
  Administered 2017-06-22: 4 mg via INTRAVENOUS
  Filled 2017-06-22: qty 2

## 2017-06-22 MED ORDER — MORPHINE SULFATE (PF) 4 MG/ML IV SOLN
4.0000 mg | Freq: Once | INTRAVENOUS | Status: AC
  Administered 2017-06-22: 4 mg via INTRAVENOUS
  Filled 2017-06-22: qty 1

## 2017-06-22 MED ORDER — OXYCODONE-ACETAMINOPHEN 5-325 MG PO TABS
2.0000 | ORAL_TABLET | Freq: Once | ORAL | Status: AC
  Administered 2017-06-22: 2 via ORAL
  Filled 2017-06-22: qty 2

## 2017-06-22 NOTE — ED Provider Notes (Signed)
[facility] EMERGENCY DEPARTMENT Provider Note   CSN: [id number] Arrival date & time: 06/22/17  1655     History   Chief Complaint Chief Complaint  Patient presents with  . Weakness    HPI Kurt Horton is a 57 y.o. male.  History is provided by the patient and review of the electronic medical record.  HPI   Kurt Horton is a 57 y.o. male with a history of alcohol abuse, diabetes, hypertension, myasthenia gravis, and chronic back pain, who presents to the ED for evaluation of acute worsening of his chronic back pain, with associated weakness of bilateral lower extremities.  Patient reports over the past few days he has had worsened back pain, which starts at the midline and spreads across both sides of the back.  Patient reports yesterday he had difficulty walking due to both of his legs feeling extremely weak.  Patient also reports intermittent numbness in both legs and tingling sensations.  Patient reports an episode of incontinence of urine and stool yesterday.  Patient denies any associated fevers or chills, no abdominal pain or dysuria.  No history of cancer or IV drug use.  Patient repeatedly expresses concern over cauda equina, reports he has been researching this and thinks he may have it.  Patient reports he is on chronic pain medication as prescribed by his primary care doctor, and this has not been helping with his pain.  Reports in the past he was medicating his back pain with alcohol, but stopped drinking about a week ago, initially went through withdraws, reports this is much improved, and he now only exhibits a slight tremor.  Patient also reports recent development of lower extremity edema, he was seen for his primary care doctor for this, and will be started on Lasix, he is not having any chest pain or shortness of breath with this.  Past Medical History:  Diagnosis Date  . Alcohol abuse   . Alcohol withdrawal (HCC) 11/12/2016  . Diabetes mellitus  without complication (HCC)   . DJD (degenerative joint disease)   . Hypertension   . Myasthenia gravis (HCC)   . Renal disorder     Patient Active Problem List   Diagnosis Date Noted  . Alcohol withdrawal delirium (HCC) 02/26/2017  . Atrial fibrillation with RVR (HCC) 02/25/2017  . Acute alcoholic intoxication with complication (HCC)   . Alcohol abuse   . Alcohol withdrawal (HCC) 11/13/2016  . Substance induced mood disorder (HCC) 11/13/2016  . SVT (supraventricular tachycardia) (HCC) 11/12/2016  . HTN (hypertension) 11/12/2016  . EtOH dependence (HCC) 11/12/2016  . Lumbar radiculopathy 08/24/2016  . Depression with anxiety 08/24/2016  . Chronic low back pain 01/04/2016  . Pressure ulcer 12/29/2015  . Osteomyelitis (HCC) 12/26/2015  . PVD (peripheral vascular disease) (HCC) 12/26/2015  . Type II diabetes mellitus with manifestations (HCC) 12/26/2015  . Myasthenia gravis (HCC) 12/26/2015    Past Surgical History:  Procedure Laterality Date  . AMPUTATION TOE Left 12/29/2015   Procedure: AMPUTATION TOE;  Surgeon: Gwyneth Revels, DPM;  Location: [facility] ORS;  Service: Podiatry;  Laterality: Left;  . PERIPHERAL VASCULAR CATHETERIZATION N/A 12/27/2015   Procedure: Lower Extremity Angiography;  Surgeon: Annice Needy, MD;  Location: [facility] INVASIVE CV LAB;  Service: Cardiovascular;  Laterality: N/A;       Home Medications    Prior to Admission medications   Medication Sig Start Date End Date Taking? Authorizing Provider  B Complex-C (B-COMPLEX WITH VITAMIN C) tablet Take 1 tablet  by mouth daily.   Yes [provider]  citalopram (CELEXA) 20 MG tablet Take 1 tablet (20 mg total) by mouth daily. 08/24/16  Yes Sater, Pearletha Furlichard A, MD  furosemide (LASIX) 40 MG tablet Take 40 mg by mouth daily as needed for fluid (if taking prednisone).    Yes [provider]  gabapentin (NEURONTIN) 300 MG capsule Take 1 capsule (300 mg total) by mouth 3 (three) times daily. Patient taking  differently: Take 600 mg 3 (three) times daily by mouth.  03/01/17  Yes Enid BaasKalisetti, Radhika, MD  oxyCODONE (OXY IR/ROXICODONE) 5 MG immediate release tablet Take 5 mg by mouth 3 (three) times daily as needed. 06/21/17 06/26/17 Yes [provider]  potassium chloride (K-DUR) 10 MEQ tablet Take 10 mEq by mouth daily. 06/21/17 06/21/18 Yes [provider]  carvedilol (COREG) 6.25 MG tablet Take 1 tablet (6.25 mg total) by mouth 2 (two) times daily with a meal. Patient not taking: Reported on 06/07/2017 03/01/17   Enid BaasKalisetti, Radhika, MD  lisinopril (PRINIVIL,ZESTRIL) 10 MG tablet Take 1 tablet (10 mg total) by mouth daily. Patient not taking: Reported on 06/22/2017 12/30/15   Alford HighlandWieting, Richard, MD  metFORMIN (GLUCOPHAGE) 500 MG tablet Take 1 tablet (500 mg total) by mouth daily. Patient not taking: Reported on 06/22/2017 12/30/15   Alford HighlandWieting, Richard, MD    Family History Family History  Problem Relation Age of Onset  . Rheum arthritis Father     Social History Social History   Tobacco Use  . Smoking status: Current Every Day Smoker    Packs/day: 0.50    Types: Cigarettes  . Smokeless tobacco: Never Used  Substance Use Topics  . Alcohol use: Yes    Comment: Pt had tequila and rum, at least a 5th  . Drug use: No     Allergies   Other   Review of Systems Review of Systems  Constitutional: Negative for chills and fever.  HENT: Negative.   Respiratory: Negative for cough, chest tightness and shortness of breath.   Cardiovascular: Positive for leg swelling. Negative for chest pain and palpitations.  Gastrointestinal: Negative for abdominal pain, constipation, diarrhea, nausea and vomiting.       Bowel incontinence  Genitourinary: Negative for difficulty urinating, dysuria and frequency.       Urinary incontinence  Musculoskeletal: Positive for back pain. Negative for arthralgias, gait problem, myalgias and neck pain.     Physical Exam Updated Vital Signs BP 116/79    Pulse (!) 103   Temp 98.4 F (36.9 C)   Resp 18   Ht 5\' 10"  (1.778 m)   Wt 86.2 kg (190 lb)   SpO2 100%   BMI 27.26 kg/m   Physical Exam  Constitutional: He appears well-developed and well-nourished. No distress.  Patient appears to be in pain but is in no acute distress  HENT:  Head: Normocephalic and atraumatic.  Eyes: EOM are normal. Pupils are equal, round, and reactive to light. Right eye exhibits no discharge. Left eye exhibits no discharge.  Neck: Neck supple.  No tenderness on palpation of the C-spine, normal range of motion  Cardiovascular: Normal rate, regular rhythm and normal heart sounds.  Pulses:      Radial pulses are 2+ on the right side, and 2+ on the left side.       Dorsalis pedis pulses are 2+ on the right side, and 2+ on the left side.       Posterior tibial pulses are 2+ on the right side,  and 2+ on the left side.  Pulmonary/Chest: Effort normal and breath sounds normal. No stridor. No respiratory distress. He has no wheezes. He has no rales. He exhibits no tenderness.  Abdominal: Soft. Bowel sounds are normal. He exhibits no distension and no mass. There is no tenderness. There is no guarding.  Musculoskeletal:  Tenderness over the distal lumbar spine, and extending across both sides of the back Bilateral 2+ pitting edema over the foot and ankle, no erythema or calf tenderness  Neurological: He is alert. Coordination normal.  Speech is clear, able to follow commands CN III-XII intact Normal strength in upper extremities bilaterally, lower extremities bilaterally with 4 out of 5 strength of proximal muscles, normal dorsi and plantar flexion Sensation normal to light and sharp touch Moves extremities without ataxia, coordination intact  Skin: He is not diaphoretic.  Psychiatric: He has a normal mood and affect. His behavior is normal.  Nursing note and vitals reviewed.    ED Treatments / Results  Labs (all labs ordered are listed, but only abnormal  results are displayed) Labs Reviewed  COMPREHENSIVE METABOLIC PANEL - Abnormal; Notable for the following components:      Result Value   Potassium 3.3 (*)    CO2 21 (*)    Glucose, Bld 182 (*)    Calcium 8.7 (*)    Total Protein 6.0 (*)    Albumin 3.3 (*)    AST 55 (*)    All other components within normal limits  CBC - Abnormal; Notable for the following components:   RBC 3.59 (*)    Hemoglobin 9.1 (*)    HCT 30.3 (*)    MCH 25.3 (*)    RDW 20.7 (*)    All other components within normal limits    EKG  EKG Interpretation None       Radiology Mr Lumbar Spine W Wo Contrast  Result Date: 06/22/2017 CLINICAL DATA:  57 year old male with chronic lumbar back pain. Difficulty walking since yesterday. EXAM: MRI LUMBAR SPINE WITHOUT AND WITH CONTRAST TECHNIQUE: Multiplanar and multiecho pulse sequences of the lumbar spine were obtained without and with intravenous contrast. CONTRAST:  19mL MULTIHANCE GADOBENATE DIMEGLUMINE 529 MG/ML IV SOLN COMPARISON:  Lumbar MRI 09/09/2016. FINDINGS: Segmentation: Lumbar segmentation appears to be normal and will be designated as such, as on the 2018 MRI. Alignment: Chronic dextroconvex lumbar spine curvature, reversal of upper lumbar lordosis and multilevel spondylolisthesis have not significantly changed since 2018. There is mild retrolisthesis of L2 on L3, and mild anterolisthesis of L4 on L5. Vertebrae: Degenerative endplate marrow edema persists at L2-L3, but is decreased in intensity since the 2018 MRI. Background bone marrow signal is within normal limits. No other marrow edema or acute osseous abnormality. Conus medullaris and cauda equina: Conus extends to the L1 level. Conus and cauda equina appear normal. No abnormal intradural enhancement. Paraspinal and other soft tissues: Negative visualized abdominal viscera. Stable posterior paraspinal soft tissues. Disc levels: No lower thoracic spinal stenosis. T12-L1: Mild retrolisthesis and disc bulging  is stable. No stenosis. L1-L2: Stable disc space loss. Stable to mildly improved right eccentric posterior disc bulging. Underlying circumferential disc bulge and endplate spurring. Stable to mildly improved right lateral recess patency at the descending right L2 nerve level. Mild residual stenosis. L2-L3: Severe chronic disc space loss at this level, probable increased vacuum disc since 2018. However, the bulky circumferential disc protrusion and left paracentral lobulated caudal disc extrusion have regressed since the prior MRI. Moderate facet hypertrophy is stable,  degenerative facet joint fluid is stable to decreased. Improved thecal sac and bilateral lateral recess patency with mild residual spinal stenosis. No foraminal stenosis. L3-L4: Stable circumferential disc bulge at this level, although the broad-based posterior component of disc appears regressed. Stable moderate to severe facet hypertrophy. Stable epidural lipomatosis. Stable to improved mild spinal stenosis. Stable mild left L3 neural foraminal stenosis. L4-L5: Chronic severe multifactorial spinal stenosis at this level related to mild anterolisthesis with broad-based posterior and foraminal disc bulging plus severe bilateral facet and ligament flavum hypertrophy. Degenerative facet joint fluid at this level has increased since 2018. Also there is a new left paracentral cephalad disc extrusion measuring about 6 millimeters in diameter and 9 millimeters in length (series 5, image 20). Mild bilateral L4 foraminal stenosis remain stable. L5-S1: Stable chronic severe disc space loss with circumferential disc osteophyte complex and moderate facet hypertrophy. Epidural lipomatosis here has regressed with improved thecal sac patency. Stable mild right L5 foraminal stenosis. IMPRESSION: 1. Since May 2018 lumbar spine degeneration has progressed at L4-L5, in the form of a new left paracentral disc extrusion at that level measuring 6 x 9 mm, and increased  degenerative facet joint fluid. However, chronic severe L4-L5 spinal stenosis - in the setting of chronic spondylolisthesis and severe facet arthropathy - has not significantly changed. 2. At the same time, advanced spinal degeneration at L2-L3 has mildly improved since 2018. Including regression of the bulky disc extrusion at seen in 2018, and improved thecal sac patency. 3. The L1-L2 and L3-L4 levels are also stable to mildly improved since 2018. Electronically Signed   By: Odessa Fleming M.D.   On: 06/22/2017 22:04    Procedures Procedures (including critical care time)  Medications Ordered in ED Medications  morphine 4 MG/ML injection 4 mg (4 mg Intravenous Given 06/22/17 2009)  ondansetron (ZOFRAN) injection 4 mg (4 mg Intravenous Given 06/22/17 2008)  gadobenate dimeglumine (MULTIHANCE) injection 19 mL (19 mLs Intravenous Contrast Given 06/22/17 2142)     Initial Impression / Assessment and Plan / ED Course  I have reviewed the triage vital signs and the nursing notes.  Pertinent labs & imaging results that were available during my care of the patient were reviewed by me and considered in my medical decision making (see chart for details).  Patient presents with worsening of his chronic low back pain, over the past 2 days patient has had weakness in the lower legs and difficulty walking.  He reports intermittent numbness and tingling.  Patient also reports an episode of incontinence of bowel and bladder yesterday.  Home pain medications have not helped.  Initial evaluation patient is mildly tachycardic, I feel that this is likely due to pain, all other vital signs are normal.  Patient does appear to be uncomfortable but is in no acute distress.  On neurologic exam patient does exhibit 4 out of 5 strength of bilateral lower extremities, sensation appears to be intact.  Given concerning symptoms described by the patient, will get lumbar spine MRI.  The symptoms are not associated with any fevers or  abdominal pain, no history of IV drug use, no concern for infection at this time.  Will give pain medication while patient awaits MRI.  Basic labs obtained in triage, leukocytosis, hemoglobin is 9.1, which appears to be around the patient's baseline, potassium is slightly low at 3.3, patient was just recently put on potassium supplementation by his primary care doctor and has not started this medication yet, will start tomorrow.  No  other acute electrolyte derangements requiring intervention, normal kidney function.   MRI shows progressive lumbar spinal degeneration at L4-L5, with a new left paracentral disc extrusion measuring 6 x 9 mm and increased degenerative facet joint fluid, however chronic severe spinal stenosis is not significantly changed.  It also shows that advanced spinal degeneration at L2-L3 has mildly improved, there is no evidence of cauda equina, no spinal cord impingement.  These results were discussed with the patient, and a copy of the MRI report was printed out for him.  There are no acute findings requiring neurosurgical intervention.  Patient will need to follow-up outpatient with neurosurgery, as well as his neurologist and primary care doctor.  Patient was also recently referred to Dr. Welton Flakes with pain management.  Given that patient has already been prescribed narcotic pain medication by his PCP will not prescribe any additional pain management, and encouraged patient to continue with home regimen and follow-up as we discussed.  Strict return precautions discussed.  At this time patient is stable for discharge home.  Final Clinical Impressions(s) / ED Diagnoses   Final diagnoses:  Chronic midline low back pain with sciatica, sciatica laterality unspecified  Degenerative disc disease, lumbar    ED Discharge Orders    None       Legrand Rams 06/23/17 Valerie Salts, MD 06/23/17 1601

## 2017-06-22 NOTE — Telephone Encounter (Signed)
Spoke with Kurt Horton.  He sts. that, after internet research, he feels he has a "medical and surgical emergency."  that could cause paralysis. I have advised that if he feels he has an emergent situation, he should go to the ER.  After speaking with RAS, I called and lmom (identified vm) that he can refer to [facility], but he should still [facility] the [facility]  Please call back if he would like referral/fim

## 2017-06-22 NOTE — ED Triage Notes (Signed)
The pt has been having difficulty walking since yesterday  Chronic back pain

## 2017-06-22 NOTE — ED Triage Notes (Signed)
The pt reports that he has lost control of bladder and bowels  But he is an alcoholic  And has been in withdrawal for the past week

## 2017-06-22 NOTE — ED Triage Notes (Signed)
The pt has been on the intra net and he thinks he has an emergent process

## 2017-06-22 NOTE — Telephone Encounter (Signed)
Pt called stating he has done some research to figure out what's going on. [place] States his condition is an emergency stating he has "Cauda Equina" pt is wanting a call back asap

## 2017-06-22 NOTE — ED Notes (Signed)
Patient transported to MRI 

## 2017-06-23 NOTE — Discharge Instructions (Signed)
MRI shows chronic changes with worsening of the disc changes at L4-L5 which could explain your worsening pain, does not show any signs of cauda equina syndrome, you will need to follow-up with the neurosurgeon you have seen previously, as well as your neurologist and primary care doctor, please continue with your her home chronic pain regimen.  If you have severely worsening back pain, or unable to walk or move your legs, have numbness of both legs, or numbness in all the places that the saddle with touch, loss of control of your bowel or bladder or other new or concerning symptoms please return to the ED for reevaluation.

## 2017-06-25 ENCOUNTER — Other Ambulatory Visit: Payer: Self-pay | Admitting: Internal Medicine

## 2017-06-25 DIAGNOSIS — R262 Difficulty in walking, not elsewhere classified: Secondary | ICD-10-CM

## 2017-06-25 DIAGNOSIS — M48062 Spinal stenosis, lumbar region with neurogenic claudication: Secondary | ICD-10-CM

## 2017-06-25 DIAGNOSIS — R29898 Other symptoms and signs involving the musculoskeletal system: Secondary | ICD-10-CM

## 2017-06-25 DIAGNOSIS — G894 Chronic pain syndrome: Secondary | ICD-10-CM

## 2017-06-25 DIAGNOSIS — M4727 Other spondylosis with radiculopathy, lumbosacral region: Secondary | ICD-10-CM

## 2017-06-25 DIAGNOSIS — R2689 Other abnormalities of gait and mobility: Secondary | ICD-10-CM

## 2017-07-12 ENCOUNTER — Other Ambulatory Visit: Payer: Self-pay | Admitting: Family Medicine

## 2017-07-12 ENCOUNTER — Ambulatory Visit
Admission: RE | Admit: 2017-07-12 | Discharge: 2017-07-12 | Disposition: A | Discharge status: Home / Self Care | Payer: Medicaid Other | Source: Ambulatory Visit | Attending: Family Medicine | Admitting: Family Medicine

## 2017-07-12 DIAGNOSIS — R29898 Other symptoms and signs involving the musculoskeletal system: Secondary | ICD-10-CM

## 2017-07-12 DIAGNOSIS — R600 Localized edema: Secondary | ICD-10-CM | POA: Diagnosis not present

## 2017-08-13 ENCOUNTER — Other Ambulatory Visit: Payer: Self-pay

## 2017-08-13 ENCOUNTER — Emergency Department: Payer: Medicaid Other

## 2017-08-13 ENCOUNTER — Inpatient Hospital Stay
Admission: EM | Admit: 2017-08-13 | Discharge: 2017-08-18 | DRG: 309 | Disposition: A | Discharge status: Home / Self Care | Payer: Medicaid Other | Attending: Internal Medicine | Admitting: Internal Medicine

## 2017-08-13 DIAGNOSIS — Y908 Blood alcohol level of 240 mg/100 ml or more: Secondary | ICD-10-CM | POA: Diagnosis present

## 2017-08-13 DIAGNOSIS — I248 Other forms of acute ischemic heart disease: Secondary | ICD-10-CM | POA: Diagnosis present

## 2017-08-13 DIAGNOSIS — F1010 Alcohol abuse, uncomplicated: Secondary | ICD-10-CM | POA: Diagnosis not present

## 2017-08-13 DIAGNOSIS — G7000 Myasthenia gravis without (acute) exacerbation: Secondary | ICD-10-CM | POA: Diagnosis present

## 2017-08-13 DIAGNOSIS — F10229 Alcohol dependence with intoxication, unspecified: Secondary | ICD-10-CM | POA: Diagnosis present

## 2017-08-13 DIAGNOSIS — Z7984 Long term (current) use of oral hypoglycemic drugs: Secondary | ICD-10-CM | POA: Diagnosis not present

## 2017-08-13 DIAGNOSIS — I110 Hypertensive heart disease with heart failure: Secondary | ICD-10-CM | POA: Diagnosis present

## 2017-08-13 DIAGNOSIS — Z89421 Acquired absence of other right toe(s): Secondary | ICD-10-CM | POA: Diagnosis not present

## 2017-08-13 DIAGNOSIS — I426 Alcoholic cardiomyopathy: Secondary | ICD-10-CM | POA: Diagnosis present

## 2017-08-13 DIAGNOSIS — Z79899 Other long term (current) drug therapy: Secondary | ICD-10-CM | POA: Diagnosis not present

## 2017-08-13 DIAGNOSIS — R29810 Facial weakness: Secondary | ICD-10-CM | POA: Diagnosis not present

## 2017-08-13 DIAGNOSIS — F10929 Alcohol use, unspecified with intoxication, unspecified: Secondary | ICD-10-CM | POA: Diagnosis present

## 2017-08-13 DIAGNOSIS — E860 Dehydration: Secondary | ICD-10-CM | POA: Diagnosis present

## 2017-08-13 DIAGNOSIS — I5042 Chronic combined systolic (congestive) and diastolic (congestive) heart failure: Secondary | ICD-10-CM | POA: Diagnosis present

## 2017-08-13 DIAGNOSIS — F10239 Alcohol dependence with withdrawal, unspecified: Secondary | ICD-10-CM | POA: Diagnosis present

## 2017-08-13 DIAGNOSIS — E119 Type 2 diabetes mellitus without complications: Secondary | ICD-10-CM | POA: Diagnosis present

## 2017-08-13 DIAGNOSIS — E118 Type 2 diabetes mellitus with unspecified complications: Secondary | ICD-10-CM | POA: Diagnosis present

## 2017-08-13 DIAGNOSIS — I4891 Unspecified atrial fibrillation: Principal | ICD-10-CM | POA: Diagnosis present

## 2017-08-13 DIAGNOSIS — I10 Essential (primary) hypertension: Secondary | ICD-10-CM | POA: Diagnosis present

## 2017-08-13 DIAGNOSIS — F17210 Nicotine dependence, cigarettes, uncomplicated: Secondary | ICD-10-CM | POA: Diagnosis present

## 2017-08-13 DIAGNOSIS — F1020 Alcohol dependence, uncomplicated: Secondary | ICD-10-CM | POA: Diagnosis present

## 2017-08-13 LAB — CBC WITH DIFFERENTIAL/PLATELET
BASOS ABS: 0.1 10*3/uL (ref 0–0.1)
Basophils Relative: 2 %
EOS ABS: 0 10*3/uL (ref 0–0.7)
Eosinophils Relative: 0 %
HCT: 40 % (ref 40.0–52.0)
Hemoglobin: 12.4 g/dL — ABNORMAL LOW (ref 13.0–18.0)
LYMPHS PCT: 50 %
Lymphs Abs: 2.9 10*3/uL (ref 1.0–3.6)
MCH: 23.1 pg — ABNORMAL LOW (ref 26.0–34.0)
MCHC: 31 g/dL — ABNORMAL LOW (ref 32.0–36.0)
MCV: 74.5 fL — ABNORMAL LOW (ref 80.0–100.0)
Monocytes Absolute: 0.4 10*3/uL (ref 0.2–1.0)
Monocytes Relative: 8 %
NEUTROS PCT: 40 %
Neutro Abs: 2.2 10*3/uL (ref 1.4–6.5)
Platelets: 166 10*3/uL (ref 150–440)
RBC: 5.37 MIL/uL (ref 4.40–5.90)
RDW: 21.5 % — AB (ref 11.5–14.5)
WBC: 5.6 10*3/uL (ref 3.8–10.6)

## 2017-08-13 LAB — COMPREHENSIVE METABOLIC PANEL
ALBUMIN: 4 g/dL (ref 3.5–5.0)
ALK PHOS: 81 U/L (ref 38–126)
ALT: 36 U/L (ref 17–63)
ANION GAP: 14 (ref 5–15)
AST: 64 U/L — ABNORMAL HIGH (ref 15–41)
BUN: 18 mg/dL (ref 6–20)
CALCIUM: 8.1 mg/dL — AB (ref 8.9–10.3)
CHLORIDE: 101 mmol/L (ref 101–111)
CO2: 24 mmol/L (ref 22–32)
Creatinine, Ser: 0.8 mg/dL (ref 0.61–1.24)
GFR calc Af Amer: >60 mL/min (ref >60)
GFR calc non Af Amer: >60 mL/min (ref >60)
GLUCOSE: 123 mg/dL — AB (ref 65–99)
POTASSIUM: 3.2 mmol/L — AB (ref 3.5–5.1)
SODIUM: 139 mmol/L (ref 135–145)
Total Bilirubin: 0.9 mg/dL (ref 0.3–1.2)
Total Protein: 6.9 g/dL (ref 6.5–8.1)

## 2017-08-13 LAB — DIGOXIN LEVEL

## 2017-08-13 LAB — TROPONIN I: TROPONIN I: 0.05 ng/mL — AB (ref <0.03)

## 2017-08-13 LAB — ETHANOL: Alcohol, Ethyl (B): 443 mg/dL (ref <10)

## 2017-08-13 LAB — ACETAMINOPHEN LEVEL: Acetaminophen (Tylenol), Serum: <10 ug/mL — ABNORMAL LOW (ref 10–30)

## 2017-08-13 LAB — SALICYLATE LEVEL

## 2017-08-13 MED ORDER — HALOPERIDOL LACTATE 5 MG/ML IJ SOLN
5.0000 mg | Freq: Once | INTRAMUSCULAR | Status: AC
  Administered 2017-08-13: 5 mg via INTRAVENOUS
  Filled 2017-08-13: qty 1

## 2017-08-13 MED ORDER — SODIUM CHLORIDE 0.9 % IV BOLUS
1000.0000 mL | Freq: Once | INTRAVENOUS | Status: AC
Start: 2017-08-13 — End: 2017-08-13
  Administered 2017-08-13: 1000 mL via INTRAVENOUS

## 2017-08-13 MED ORDER — DILTIAZEM HCL 100 MG IV SOLR
5.0000 mg/h | Freq: Once | INTRAVENOUS | Status: AC
  Administered 2017-08-13: 5 mg/h via INTRAVENOUS
  Filled 2017-08-13: qty 100

## 2017-08-13 MED ORDER — HALOPERIDOL LACTATE 5 MG/ML IJ SOLN: 2.5000 mg | Freq: Once | INTRAMUSCULAR | Status: DC

## 2017-08-13 MED ORDER — METOPROLOL TARTRATE 5 MG/5ML IV SOLN
5.0000 mg | Freq: Once | INTRAVENOUS | Status: DC
  Filled 2017-08-13: qty 5

## 2017-08-13 MED ORDER — SODIUM CHLORIDE 0.9 % IV BOLUS
1000.0000 mL | Freq: Once | INTRAVENOUS | Status: AC
  Administered 2017-08-13: 1000 mL via INTRAVENOUS

## 2017-08-13 MED ORDER — MAGNESIUM SULFATE 4 GM/100ML IV SOLN
4.0000 g | Freq: Once | INTRAVENOUS | Status: AC
  Administered 2017-08-13: 4 g via INTRAVENOUS
  Filled 2017-08-13: qty 100

## 2017-08-13 MED ORDER — PHENOBARBITAL SODIUM 130 MG/ML IJ SOLN
130.0000 mg | Freq: Once | INTRAMUSCULAR | Status: AC
  Administered 2017-08-13: 130 mg via INTRAVENOUS
  Filled 2017-08-13: qty 1

## 2017-08-13 MED ORDER — LORAZEPAM 2 MG/ML IJ SOLN
2.0000 mg | Freq: Once | INTRAMUSCULAR | Status: AC
  Administered 2017-08-13: 2 mg via INTRAVENOUS
  Filled 2017-08-13: qty 1

## 2017-08-13 NOTE — ED Notes (Signed)
Pt covered from head to toe in feces, pt cleaned up, and dressed out into burgundy scrubs.

## 2017-08-13 NOTE — ED Notes (Signed)
Date and time results received: 08/13/17  Test: troponin Critical Value: 0.05  Name of Provider Notified: [email or web address]  Orders Received? Or Actions Taken?: MD notified

## 2017-08-13 NOTE — H&P (Signed)
[facility] [place] [facility]   PATIENT NAME: Kurt Horton    MR#:  [id number]  DATE OF BIRTH:  15-Feb-1961  DATE OF ADMISSION:  08/13/2017  PRIMARY CARE PHYSICIAN: [name], MD   REQUESTING/REFERRING PHYSICIAN: Lamont Snowball, MD  CHIEF COMPLAINT:   Chief Complaint  Patient presents with  . Alcohol Intoxication    HISTORY OF PRESENT ILLNESS:  Kurt Horton  is a 57 y.o. male who presents with acute alcohol intoxication.  Patient arrived in the ED and was found to be in A. fib with RVR.  Patient has a history of heavy drinking for the past couple of years.  He lives his parents, who brought him to the ED today.  Here they had him involuntarily committed as the patient's drinking has become harmful to himself.  He was initially hypotensive as well, which limited ability to give aggressive nodal blocking agents in the ED to rate control his A. fib with RVR.  Hospitalist were called for admission  PAST MEDICAL HISTORY:   Past Medical History:  Diagnosis Date  . Alcohol abuse   . Alcohol withdrawal (HCC) 11/12/2016  . Diabetes mellitus without complication (HCC)   . DJD (degenerative joint disease)   . Hypertension   . Myasthenia gravis (HCC)   . Renal disorder      PAST SURGICAL HISTORY:   Past Surgical History:  Procedure Laterality Date  . AMPUTATION TOE Left 12/29/2015   Procedure: AMPUTATION TOE;  Surgeon: Gwyneth Revels, DPM;  Location: [facility] ORS;  Service: Podiatry;  Laterality: Left;  . PERIPHERAL VASCULAR CATHETERIZATION N/A 12/27/2015   Procedure: Lower Extremity Angiography;  Surgeon: Annice Needy, MD;  Location: [facility] INVASIVE CV LAB;  Service: Cardiovascular;  Laterality: N/A;     SOCIAL HISTORY:   Social History   Tobacco Use  . Smoking status: Current Every Day Smoker    Packs/day: 0.50    Types: Cigarettes  . Smokeless tobacco: Never Used  Substance Use Topics  . Alcohol use: Yes    Comment: Pt had tequila and rum, at least  a 5th     FAMILY HISTORY:   Family History  Problem Relation Age of Onset  . Rheum arthritis Father      DRUG ALLERGIES:   Allergies  Allergen Reactions  . Other Other (See Comments)    Patient states he can't take any "mycin" medications because myasthenia gravis.    MEDICATIONS AT HOME:   Prior to Admission medications   Medication Sig Start Date End Date Taking? Authorizing Provider  B Complex-C (B-COMPLEX WITH VITAMIN C) tablet Take 1 tablet by mouth daily.   Yes [provider]  citalopram (CELEXA) 20 MG tablet Take 1 tablet (20 mg total) by mouth daily. 08/24/16  Yes Sater, Pearletha Furl, MD  furosemide (LASIX) 40 MG tablet Take 40 mg by mouth daily as needed for fluid (if taking prednisone).    Yes [provider]  gabapentin (NEURONTIN) 300 MG capsule Take 1 capsule (300 mg total) by mouth 3 (three) times daily. Patient taking differently: Take 600 mg 3 (three) times daily by mouth.  03/01/17  Yes Enid Baas, MD  lisinopril (PRINIVIL,ZESTRIL) 10 MG tablet Take 1 tablet (10 mg total) by mouth daily. 12/30/15  Yes Wieting, Richard, MD  metFORMIN (GLUCOPHAGE) 500 MG tablet Take 1 tablet (500 mg total) by mouth daily. 12/30/15  Yes Wieting, Richard, MD  mycophenolate (CELLCEPT) 500 MG tablet Take 1 tablet (500MG ) by mouth every morning and  2 tablets (1000MG ) by mouth every evening   Yes [provider]  potassium chloride (K-DUR) 10 MEQ tablet Take 10 mEq by mouth daily. 06/21/17 06/21/18 Yes [provider]  torsemide (DEMADEX) 10 MG tablet Take 10 mg by mouth daily.   Yes [provider]  carvedilol (COREG) 6.25 MG tablet Take 1 tablet (6.25 mg total) by mouth 2 (two) times daily with a meal. Patient not taking: Reported on 06/07/2017 03/01/17   Enid Baas, MD    REVIEW OF SYSTEMS:  Review of Systems  Unable to perform ROS: Acuity of condition     VITAL SIGNS:   Vitals:   08/13/17 2201 08/13/17 2230 08/13/17 2317  08/13/17 2330  BP: (!) 142/93 (!) 123/98 124/79 (!) 131/103  Pulse: 93 (!) 139 (!) 128 (!) 150  Resp:  16 (!) 22 20  SpO2: 99% 96% 97% (!) 89%  Weight:      Height:       Wt Readings from Last 3 Encounters:  08/13/17 79.4 kg (175 lb)  06/22/17 86.2 kg (190 lb)  06/07/17 86.2 kg (190 lb)    PHYSICAL EXAMINATION:  Physical Exam  Vitals reviewed. Constitutional: He appears well-developed and well-nourished. No distress.  HENT:  Head: Normocephalic and atraumatic.  Mouth/Throat: Oropharynx is clear and moist.  Eyes: Pupils are equal, round, and reactive to light. Conjunctivae and EOM are normal. No scleral icterus.  Neck: Normal range of motion. Neck supple. No JVD present. No thyromegaly present.  Cardiovascular: Intact distal pulses. Exam reveals no gallop and no friction rub.  No murmur heard. Tachycardic, irregular rhythm  Respiratory: Effort normal and breath sounds normal. No respiratory distress. He has no wheezes. He has no rales.  GI: Soft. Bowel sounds are normal. He exhibits no distension. There is no tenderness.  Musculoskeletal: Normal range of motion. He exhibits no edema.  No arthritis, no gout  Lymphadenopathy:    He has no cervical adenopathy.  Neurological: No cranial nerve deficit.  Unable to assess due to patient condition  Skin: Skin is warm and dry. No rash noted. No erythema.  Psychiatric:  Unable to assess due to patient condition    LABORATORY PANEL:   CBC Recent Labs  Lab 08/13/17 1759  WBC 5.6  HGB 12.4*  HCT 40.0  PLT 166   ------------------------------------------------------------------------------------------------------------------  Chemistries  Recent Labs  Lab 08/13/17 1759  NA 139  K 3.2*  CL 101  CO2 24  GLUCOSE 123*  BUN 18  CREATININE 0.80  CALCIUM 8.1*  AST 64*  ALT 36  ALKPHOS 81  BILITOT 0.9    ------------------------------------------------------------------------------------------------------------------  Cardiac Enzymes Recent Labs  Lab 08/13/17 1759  TROPONINI 0.05*   ------------------------------------------------------------------------------------------------------------------  RADIOLOGY:  Dg Chest Port 1 View  Result Date: 08/13/2017 CLINICAL DATA:  Back pain. EXAM: PORTABLE CHEST 1 VIEW COMPARISON:  Radiographs of March 19, 2017. FINDINGS: Stable cardiomegaly. No pneumothorax or pleural effusion is noted. Both lungs are clear. The visualized skeletal structures are unremarkable. IMPRESSION: No acute cardiopulmonary abnormality seen. Electronically Signed   By: Lupita Raider, M.D.   On: 08/13/2017 18:18    EKG:   Orders placed or performed during the hospital encounter of 08/13/17  . ED EKG  . ED EKG    IMPRESSION AND PLAN:  Principal Problem:   Atrial fibrillation with RVR (HCC) -patient is unable to get significant nodal blocking agents initially due to hypotension.  He was placed on low-dose Cardizem drip.  After some IV fluids  his pressure improved, allowing for greater titration of his drip.  Will admit him to the telemetry floor with a cardiology consult Active Problems:   Acute alcoholic intoxication with complication (HCC) -CIWA protocol   Type II diabetes mellitus with manifestations (HCC) -siding scale insulin with corresponding glucose checks   HTN (hypertension) -patient was initially hypotensive when he came in, his blood pressures currently normotensive, will hold any home antihypertensives for now   Chronic combined systolic and diastolic CHF (congestive heart failure) (HCC) -home meds, cardiology consult as above   Myasthenia gravis (HCC) -continue home meds  Chart review performed and case discussed with ED provider. Labs, imaging and/or ECG reviewed by provider and discussed with patient/family. Management plans discussed with the  patient and/or family.  DVT PROPHYLAXIS: SubQ lovenox  GI PROPHYLAXIS: None  ADMISSION STATUS: Inpatient  CODE STATUS: Full Code Status History    Date Active Date Inactive Code Status Order ID Comments User Context   06/04/2017 0116 06/06/2017 1701 Full Code [id number]  Oralia ManisWillis, Teanna Elem, MD Inpatient   03/19/2017 1559 03/20/2017 2128 Full Code [id number]  Arnaldo NatalMalinda, Paul F, MD ED   02/25/2017 1421 03/01/2017 1829 Full Code [id number]  Houston SirenSainani, Vivek J, MD Inpatient   11/12/2016 1533 11/14/2016 1425 Full Code [id number]  Marguarite ArbourSparks, Jeffrey D, MD Inpatient   12/26/2015 1927 12/30/2015 1604 Full Code [id number]  Marguarite ArbourSparks, Jeffrey D, MD Inpatient      TOTAL TIME TAKING CARE OF THIS PATIENT: 45 minutes.   Leeana Creer FIELDING 08/13/2017, 11:38 PM  [facility]  Office  [phone]  CC: Primary care physician; Barbette ReichmannHande, Vishwanath, MD  Note:  This document was prepared using Dragon voice recognition software and may include unintentional dictation errors.

## 2017-08-13 NOTE — ED Notes (Signed)
Pharmacy contacted for phenobarbital

## 2017-08-13 NOTE — ED Notes (Signed)
Date and time results received: 08/13/17  Test: ETOH Critical Value: 443  Name of Provider Notified: Rifenbark  Orders Received? Or Actions Taken?: MD notified

## 2017-08-13 NOTE — ED Triage Notes (Signed)
Pt arrives via ems from home with with report of ETOH intoxication and afib. HR in 170's. EMS administered 6mg  adenosine 547pm with no change. Pt alert and able to follow commands at this time. Pt denies any nausea or vomiting. Currently complaining of back pain. On arrival pt covered with feces on arms and legs.

## 2017-08-13 NOTE — ED Notes (Signed)
PT  VOL °

## 2017-08-13 NOTE — ED Provider Notes (Signed)
[facility] Emergency Department Provider Note  ____________________________________________   First MD Initiated Contact with Patient 08/13/17 1758     (approximate)  I have reviewed the triage vital signs and the nursing notes.   HISTORY  Chief Complaint Alcohol Intoxication  Level 5 exemption history limited by the patient's clinical condition  HPI Kurt Horton is a 57 y.o. male who comes to the emergency department via EMS with alcohol intoxication.  Apparently the patient is an alcoholic and lives at home with his elderly parents.  He has been on a recent alcohol binge and has not been caring for himself.  Today he drank heavily and was covered in feces to his parents called 911.  They also filled out involuntary commitment paperwork.  EMS noted that the patient was in atrial fibrillation to the 170s and administered 6 mg of adenosine in route with no change.  The patient himself is unable to provide any meaningful history.  Past Medical History:  Diagnosis Date  . Alcohol abuse   . Alcohol withdrawal (HCC) 11/12/2016  . Diabetes mellitus without complication (HCC)   . DJD (degenerative joint disease)   . Hypertension   . Myasthenia gravis (HCC)   . Renal disorder     Patient Active Problem List   Diagnosis Date Noted  . Alcohol withdrawal delirium (HCC) 02/26/2017  . Atrial fibrillation with RVR (HCC) 02/25/2017  . Acute alcoholic intoxication with complication (HCC)   . Alcohol abuse   . Alcohol withdrawal (HCC) 11/13/2016  . Substance induced mood disorder (HCC) 11/13/2016  . SVT (supraventricular tachycardia) (HCC) 11/12/2016  . HTN (hypertension) 11/12/2016  . EtOH dependence (HCC) 11/12/2016  . Lumbar radiculopathy 08/24/2016  . Depression with anxiety 08/24/2016  . Chronic low back pain 01/04/2016  . Pressure ulcer 12/29/2015  . Osteomyelitis (HCC) 12/26/2015  . PVD (peripheral vascular disease) (HCC) 12/26/2015  . Type II  diabetes mellitus with manifestations (HCC) 12/26/2015  . Myasthenia gravis (HCC) 12/26/2015    Past Surgical History:  Procedure Laterality Date  . AMPUTATION TOE Left 12/29/2015   Procedure: AMPUTATION TOE;  Surgeon: Gwyneth RevelsJustin Fowler, DPM;  Location: [facility] ORS;  Service: Podiatry;  Laterality: Left;  . PERIPHERAL VASCULAR CATHETERIZATION N/A 12/27/2015   Procedure: Lower Extremity Angiography;  Surgeon: Annice NeedyJason S Dew, MD;  Location: [facility] INVASIVE CV LAB;  Service: Cardiovascular;  Laterality: N/A;    Prior to Admission medications   Medication Sig Start Date End Date Taking? Authorizing Provider  B Complex-C (B-COMPLEX WITH VITAMIN C) tablet Take 1 tablet by mouth daily.   Yes [provider]  citalopram (CELEXA) 20 MG tablet Take 1 tablet (20 mg total) by mouth daily. 08/24/16  Yes Sater, Pearletha Furlichard A, MD  furosemide (LASIX) 40 MG tablet Take 40 mg by mouth daily as needed for fluid (if taking prednisone).    Yes [provider]  gabapentin (NEURONTIN) 300 MG capsule Take 1 capsule (300 mg total) by mouth 3 (three) times daily. Patient taking differently: Take 600 mg 3 (three) times daily by mouth.  03/01/17  Yes Enid BaasKalisetti, Radhika, MD  lisinopril (PRINIVIL,ZESTRIL) 10 MG tablet Take 1 tablet (10 mg total) by mouth daily. 12/30/15  Yes Wieting, Richard, MD  metFORMIN (GLUCOPHAGE) 500 MG tablet Take 1 tablet (500 mg total) by mouth daily. 12/30/15  Yes Wieting, Richard, MD  mycophenolate (CELLCEPT) 500 MG tablet Take 1 tablet (500MG ) by mouth every morning and 2 tablets (1000MG ) by mouth every evening   Yes [provider]  potassium chloride (K-DUR) 10 MEQ tablet Take 10 mEq by mouth daily. 06/21/17 06/21/18 Yes [provider]  torsemide (DEMADEX) 10 MG tablet Take 10 mg by mouth daily.   Yes [provider]  carvedilol (COREG) 6.25 MG tablet Take 1 tablet (6.25 mg total) by mouth 2 (two) times daily with a meal. Patient not taking: Reported on 06/07/2017  03/01/17   Enid Baas, MD    Allergies Other  Family History  Problem Relation Age of Onset  . Rheum arthritis Father     Social History Social History   Tobacco Use  . Smoking status: Current Every Day Smoker    Packs/day: 0.50    Types: Cigarettes  . Smokeless tobacco: Never Used  Substance Use Topics  . Alcohol use: Yes    Comment: Pt had tequila and rum, at least a 5th  . Drug use: No    Review of Systems Level 5 exemption history limited by the patient's clinical condition  ____________________________________________   PHYSICAL EXAM:  VITAL SIGNS: ED Triage Vitals  Enc Vitals Group     BP      Pulse      Resp      Temp      Temp src      SpO2      Weight      Height      Head Circumference      Peak Flow      Pain Score      Pain Loc      Pain Edu?      Excl. in GC?     Constitutional: Heavily intoxicated, emotionally labile, smells heavily of feces Eyes: PERRL EOMI. mid range and brisk Head: Atraumatic. Nose: No congestion/rhinnorhea. Mouth/Throat: No trismus Neck: No stridor.   Cardiovascular: Tachycardic irregularly irregular Respiratory: Increased respiratory effort.  No retractions. Lungs CTAB and moving good air Gastrointestinal: Soft nontender Musculoskeletal: No lower extremity edema   Neurologic: Moves all 4 Skin:  Skin is warm, dry and intact. No rash noted. Psychiatric: Heavily intoxicated   ____________________________________________   DIFFERENTIAL includes but not limited to  Atrial fibrillation with rapid ventricular response, SVT, dehydration, sepsis, alcohol intoxication, alcohol withdrawal ____________________________________________   LABS (all labs ordered are listed, but only abnormal results are displayed)  Labs Reviewed  COMPREHENSIVE METABOLIC PANEL - Abnormal; Notable for the following components:      Result Value   Potassium 3.2 (*)    Glucose, Bld 123 (*)    Calcium 8.1 (*)    AST 64 (*)     All other components within normal limits  ACETAMINOPHEN LEVEL - Abnormal; Notable for the following components:   Acetaminophen (Tylenol), Serum <10 (*)    All other components within normal limits  ETHANOL - Abnormal; Notable for the following components:   Alcohol, Ethyl (B) 443 (*)    All other components within normal limits  TROPONIN I - Abnormal; Notable for the following components:   Troponin I 0.05 (*)    All other components within normal limits  CBC WITH DIFFERENTIAL/PLATELET - Abnormal; Notable for the following components:   Hemoglobin 12.4 (*)    MCV 74.5 (*)    MCH 23.1 (*)    MCHC 31.0 (*)    RDW 21.5 (*)    All other components within normal limits  DIGOXIN LEVEL - Abnormal; Notable for the following components:   Digoxin Level <0.2 (*)    All other components within normal limits  SALICYLATE LEVEL  BRAIN NATRIURETIC PEPTIDE  URINALYSIS, COMPLETE (UACMP) WITH MICROSCOPIC    Lab work reviewed by me with significantly elevated ethanol level otherwise largely unremarkable __________________________________________  EKG  ED ECG REPORT I, Merrily Brittle, the attending physician, personally viewed and interpreted this ECG.  Date: 08/13/2017 EKG Time:  Rate: 145 Rhythm: Atrial fibrillation with rapid ventricular response QRS Axis: normal Intervals: normal ST/T Wave abnormalities: normal Narrative Interpretation: no evidence of acute ischemia  ____________________________________________  RADIOLOGY  Chest x-ray reviewed by me with no acute disease ____________________________________________   PROCEDURES  Procedure(s) performed: no  .Critical Care Performed by: Merrily Brittle, MD Authorized by: Merrily Brittle, MD   Critical care provider statement:    Critical care time (minutes):  30   Critical care time was exclusive of:  Separately billable procedures and treating other patients   Critical care was necessary to treat or prevent imminent or  life-threatening deterioration of the following conditions:  Cardiac failure   Critical care was time spent personally by me on the following activities:  Development of treatment plan with patient or surrogate, discussions with consultants, evaluation of patient's response to treatment, examination of patient, obtaining history from patient or surrogate, ordering and performing treatments and interventions, ordering and review of laboratory studies, ordering and review of radiographic studies, pulse oximetry, re-evaluation of patient's condition and review of old charts    Critical Care performed: Yes  Observation: no ____________________________________________   INITIAL IMPRESSION / ASSESSMENT AND PLAN / ED COURSE  Pertinent labs & imaging results that were available during my care of the patient were reviewed by me and considered in my medical decision making (see chart for details).  The patient arrives critically ill with atrial fibrillation to the 160s.  His atrial fibrillation could be exacerbated by dehydration and alcohol intoxication versus primary cardiac etiology.  Fluids are pending in addition to 4 g of magnesium IV.  He is on carvedilol at home so I will begin with a low dose of IV metoprolol.    ----------------------------------------- 6:43 PM on 08/13/2017 -----------------------------------------  The patient's blood pressure dropped to 86/47 so metoprolol held. ____________________________________________   The patient remains in atrial fibrillation to 130s although is clearly improving with IV fluids.  At this point I do believe the patient requires inpatient admission for continued treatment of his atrial fibrillation with rapid ventricular response.  He is on a low-dose diltiazem drip now in addition to fluid resuscitation.  ----------------------------------------- 9:59 PM on 08/13/2017 -----------------------------------------  The patient is now extremely  tremulous raising concern for alcohol withdrawal.  130 mg of phenobarbital as well as 2 mg of Ativan.  Discussed with the hospitalist Dr. Anne Hahn who has graciously agreed to admit the patient to his service.  FINAL CLINICAL IMPRESSION(S) / ED DIAGNOSES  Final diagnoses:  Alcoholic intoxication with complication (HCC)  ETOH abuse  Atrial fibrillation with RVR (HCC)  Dehydration      NEW MEDICATIONS STARTED DURING THIS VISIT:  New Prescriptions   No medications on file     Note:  This document was prepared using Dragon voice recognition software and may include unintentional dictation errors.     Merrily Brittle, MD 08/13/17 2322

## 2017-08-14 LAB — BASIC METABOLIC PANEL
ANION GAP: 12 (ref 5–15)
BUN: 12 mg/dL (ref 6–20)
CALCIUM: 7.8 mg/dL — AB (ref 8.9–10.3)
CO2: 23 mmol/L (ref 22–32)
Chloride: 99 mmol/L — ABNORMAL LOW (ref 101–111)
Creatinine, Ser: 0.67 mg/dL (ref 0.61–1.24)
Glucose, Bld: 103 mg/dL — ABNORMAL HIGH (ref 65–99)
Potassium: 3.3 mmol/L — ABNORMAL LOW (ref 3.5–5.1)
Sodium: 134 mmol/L — ABNORMAL LOW (ref 135–145)

## 2017-08-14 LAB — TROPONIN I
TROPONIN I: 0.06 ng/mL — AB (ref <0.03)
Troponin I: 0.05 ng/mL (ref <0.03)
Troponin I: 0.05 ng/mL (ref <0.03)

## 2017-08-14 LAB — CBC
HCT: 35.7 % — ABNORMAL LOW (ref 40.0–52.0)
HCT: 35.6 % — ABNORMAL LOW (ref 40.0–52.0)
HEMOGLOBIN: 11.1 g/dL — AB (ref 13.0–18.0)
HEMOGLOBIN: 11.1 g/dL — AB (ref 13.0–18.0)
MCH: 23.1 pg — ABNORMAL LOW (ref 26.0–34.0)
MCH: 23.1 pg — ABNORMAL LOW (ref 26.0–34.0)
MCHC: 31.1 g/dL — AB (ref 32.0–36.0)
MCHC: 31.1 g/dL — ABNORMAL LOW (ref 32.0–36.0)
MCV: 74.4 fL — ABNORMAL LOW (ref 80.0–100.0)
MCV: 74.3 fL — ABNORMAL LOW (ref 80.0–100.0)
Platelets: 127 10*3/uL — ABNORMAL LOW (ref 150–440)
Platelets: 119 10*3/uL — ABNORMAL LOW (ref 150–440)
RBC: 4.79 MIL/uL (ref 4.40–5.90)
RBC: 4.79 MIL/uL (ref 4.40–5.90)
RDW: 21.6 % — ABNORMAL HIGH (ref 11.5–14.5)
RDW: 21.9 % — ABNORMAL HIGH (ref 11.5–14.5)
WBC: 5.1 10*3/uL (ref 3.8–10.6)
WBC: 5.6 10*3/uL (ref 3.8–10.6)

## 2017-08-14 LAB — C DIFFICILE QUICK SCREEN W PCR REFLEX
C Diff antigen: NEGATIVE
C Diff interpretation: NOT DETECTED
C Diff toxin: NEGATIVE

## 2017-08-14 LAB — GLUCOSE, CAPILLARY
GLUCOSE-CAPILLARY: 125 mg/dL — AB (ref 65–99)
Glucose-Capillary: 116 mg/dL — ABNORMAL HIGH (ref 65–99)
Glucose-Capillary: 170 mg/dL — ABNORMAL HIGH (ref 65–99)

## 2017-08-14 LAB — CREATININE, SERUM
CREATININE: 0.52 mg/dL — AB (ref 0.61–1.24)
GFR calc Af Amer: >60 mL/min (ref >60)

## 2017-08-14 LAB — HEMOGLOBIN A1C
HEMOGLOBIN A1C: 5.8 % — AB (ref 4.8–5.6)
MEAN PLASMA GLUCOSE: 119.76 mg/dL

## 2017-08-14 LAB — BRAIN NATRIURETIC PEPTIDE: B NATRIURETIC PEPTIDE 5: 65 pg/mL (ref 0.0–100.0)

## 2017-08-14 MED ORDER — AMIODARONE HCL IN DEXTROSE 360-4.14 MG/200ML-% IV SOLN
30.0000 mg/h | INTRAVENOUS | Status: DC
  Administered 2017-08-14 – 2017-08-15 (×2): 30 mg/h via INTRAVENOUS
  Filled 2017-08-14: qty 200

## 2017-08-14 MED ORDER — SODIUM CHLORIDE 0.9% FLUSH: 3.0000 mL | INTRAVENOUS | Status: DC | PRN

## 2017-08-14 MED ORDER — ENOXAPARIN SODIUM 40 MG/0.4ML ~~LOC~~ SOLN
40.0000 mg | SUBCUTANEOUS | Status: DC
  Administered 2017-08-15 – 2017-08-18 (×4): 40 mg via SUBCUTANEOUS
  Filled 2017-08-14 (×4): qty 0.4

## 2017-08-14 MED ORDER — DILTIAZEM HCL 100 MG IV SOLR
5.0000 mg/h | INTRAVENOUS | Status: DC
  Administered 2017-08-14: 10 mg/h via INTRAVENOUS
  Filled 2017-08-14: qty 100

## 2017-08-14 MED ORDER — POTASSIUM CHLORIDE CRYS ER 20 MEQ PO TBCR
40.0000 meq | EXTENDED_RELEASE_TABLET | ORAL | Status: AC
  Administered 2017-08-14 (×2): 40 meq via ORAL
  Filled 2017-08-14 (×2): qty 2

## 2017-08-14 MED ORDER — FOLIC ACID 1 MG PO TABS
1.0000 mg | ORAL_TABLET | Freq: Every day | ORAL | Status: DC
  Administered 2017-08-14 – 2017-08-18 (×5): 1 mg via ORAL
  Filled 2017-08-14 (×5): qty 1

## 2017-08-14 MED ORDER — LORAZEPAM 2 MG/ML IJ SOLN
0.0000 mg | Freq: Four times a day (QID) | INTRAMUSCULAR | Status: AC
  Administered 2017-08-14: 2 mg via INTRAVENOUS
  Administered 2017-08-14: 1 mg via INTRAVENOUS
  Administered 2017-08-14: 2 mg via INTRAVENOUS
  Administered 2017-08-15: 1 mg via INTRAVENOUS
  Administered 2017-08-15 (×2): 2 mg via INTRAVENOUS
  Filled 2017-08-14 (×6): qty 1

## 2017-08-14 MED ORDER — MELOXICAM 7.5 MG PO TABS
7.5000 mg | ORAL_TABLET | Freq: Two times a day (BID) | ORAL | Status: DC | PRN
  Administered 2017-08-14 – 2017-08-16 (×3): 7.5 mg via ORAL
  Filled 2017-08-14 (×3): qty 1

## 2017-08-14 MED ORDER — METOPROLOL SUCCINATE ER 50 MG PO TB24
50.0000 mg | ORAL_TABLET | Freq: Every day | ORAL | Status: DC
  Administered 2017-08-14 – 2017-08-17 (×4): 50 mg via ORAL
  Filled 2017-08-14 (×4): qty 1

## 2017-08-14 MED ORDER — ACETAMINOPHEN 650 MG RE SUPP: 650.0000 mg | Freq: Four times a day (QID) | RECTAL | Status: DC | PRN

## 2017-08-14 MED ORDER — LORAZEPAM 1 MG PO TABS
1.0000 mg | ORAL_TABLET | Freq: Four times a day (QID) | ORAL | Status: AC | PRN
  Administered 2017-08-14 – 2017-08-16 (×3): 1 mg via ORAL
  Filled 2017-08-14 (×3): qty 1

## 2017-08-14 MED ORDER — ONDANSETRON HCL 4 MG PO TABS: 4.0000 mg | ORAL_TABLET | Freq: Four times a day (QID) | ORAL | Status: DC | PRN

## 2017-08-14 MED ORDER — AMIODARONE HCL IN DEXTROSE 360-4.14 MG/200ML-% IV SOLN
60.0000 mg/h | INTRAVENOUS | Status: DC
  Administered 2017-08-14: 60 mg/h via INTRAVENOUS
  Filled 2017-08-14 (×2): qty 200

## 2017-08-14 MED ORDER — VITAMIN B-1 100 MG PO TABS
100.0000 mg | ORAL_TABLET | Freq: Every day | ORAL | Status: DC
  Administered 2017-08-14 – 2017-08-18 (×5): 100 mg via ORAL
  Filled 2017-08-14 (×5): qty 1

## 2017-08-14 MED ORDER — TRAMADOL HCL 50 MG PO TABS
50.0000 mg | ORAL_TABLET | Freq: Four times a day (QID) | ORAL | Status: DC | PRN
  Administered 2017-08-14 – 2017-08-18 (×10): 50 mg via ORAL
  Filled 2017-08-14 (×10): qty 1

## 2017-08-14 MED ORDER — MYCOPHENOLATE MOFETIL 250 MG PO CAPS
500.0000 mg | ORAL_CAPSULE | Freq: Every morning | ORAL | Status: DC
  Administered 2017-08-14 – 2017-08-18 (×5): 500 mg via ORAL
  Filled 2017-08-14 (×5): qty 2

## 2017-08-14 MED ORDER — NICOTINE 21 MG/24HR TD PT24
21.0000 mg | MEDICATED_PATCH | Freq: Every day | TRANSDERMAL | Status: DC
  Administered 2017-08-14 – 2017-08-18 (×5): 21 mg via TRANSDERMAL
  Filled 2017-08-14 (×5): qty 1

## 2017-08-14 MED ORDER — INSULIN ASPART 100 UNIT/ML ~~LOC~~ SOLN
0.0000 [IU] | Freq: Three times a day (TID) | SUBCUTANEOUS | Status: DC
  Administered 2017-08-14: 2 [IU] via SUBCUTANEOUS
  Administered 2017-08-14 – 2017-08-15 (×2): 1 [IU] via SUBCUTANEOUS
  Administered 2017-08-15: 2 [IU] via SUBCUTANEOUS
  Administered 2017-08-15: 1 [IU] via SUBCUTANEOUS
  Administered 2017-08-16: 2 [IU] via SUBCUTANEOUS
  Administered 2017-08-16 – 2017-08-17 (×2): 1 [IU] via SUBCUTANEOUS
  Filled 2017-08-14 (×8): qty 1

## 2017-08-14 MED ORDER — MYCOPHENOLATE MOFETIL 500 MG PO TABS: 500.0000 mg | ORAL_TABLET | Freq: Two times a day (BID) | ORAL | Status: DC

## 2017-08-14 MED ORDER — MYCOPHENOLATE MOFETIL 250 MG PO CAPS
1000.0000 mg | ORAL_CAPSULE | Freq: Every evening | ORAL | Status: DC
  Administered 2017-08-14 – 2017-08-17 (×4): 1000 mg via ORAL
  Filled 2017-08-14 (×5): qty 4

## 2017-08-14 MED ORDER — LORAZEPAM 2 MG/ML IJ SOLN
0.0000 mg | Freq: Two times a day (BID) | INTRAMUSCULAR | Status: AC
  Administered 2017-08-16 – 2017-08-17 (×2): 2 mg via INTRAVENOUS
  Administered 2017-08-17 – 2017-08-18 (×2): 1 mg via INTRAVENOUS
  Filled 2017-08-14 (×5): qty 1

## 2017-08-14 MED ORDER — ONDANSETRON HCL 4 MG/2ML IJ SOLN: 4.0000 mg | Freq: Four times a day (QID) | INTRAMUSCULAR | Status: DC | PRN

## 2017-08-14 MED ORDER — ADULT MULTIVITAMIN W/MINERALS CH
1.0000 | ORAL_TABLET | Freq: Every day | ORAL | Status: DC
  Administered 2017-08-14 – 2017-08-18 (×5): 1 via ORAL
  Filled 2017-08-14 (×5): qty 1

## 2017-08-14 MED ORDER — SODIUM CHLORIDE 0.9% FLUSH
3.0000 mL | Freq: Two times a day (BID) | INTRAVENOUS | Status: DC
  Administered 2017-08-14 – 2017-08-18 (×8): 3 mL via INTRAVENOUS

## 2017-08-14 MED ORDER — THIAMINE HCL 100 MG/ML IJ SOLN: 100.0000 mg | Freq: Every day | INTRAMUSCULAR | Status: DC

## 2017-08-14 MED ORDER — CITALOPRAM HYDROBROMIDE 20 MG PO TABS
20.0000 mg | ORAL_TABLET | Freq: Every day | ORAL | Status: DC
  Administered 2017-08-14: 20 mg via ORAL
  Filled 2017-08-14: qty 1

## 2017-08-14 MED ORDER — DIGOXIN 0.25 MG/ML IJ SOLN
0.2500 mg | Freq: Once | INTRAMUSCULAR | Status: AC
  Administered 2017-08-14: 0.25 mg via INTRAVENOUS
  Filled 2017-08-14: qty 1

## 2017-08-14 MED ORDER — INSULIN ASPART 100 UNIT/ML ~~LOC~~ SOLN: 0.0000 [IU] | Freq: Every day | SUBCUTANEOUS | Status: DC

## 2017-08-14 MED ORDER — HALOPERIDOL 2 MG PO TABS
2.0000 mg | ORAL_TABLET | ORAL | Status: DC | PRN
  Filled 2017-08-14 (×2): qty 1

## 2017-08-14 MED ORDER — HALOPERIDOL LACTATE 5 MG/ML IJ SOLN: 2.0000 mg | INTRAMUSCULAR | Status: DC | PRN

## 2017-08-14 MED ORDER — SODIUM CHLORIDE 0.9% FLUSH
3.0000 mL | Freq: Two times a day (BID) | INTRAVENOUS | Status: DC
  Administered 2017-08-14 – 2017-08-18 (×7): 3 mL via INTRAVENOUS

## 2017-08-14 MED ORDER — LORAZEPAM 2 MG/ML IJ SOLN
1.0000 mg | Freq: Four times a day (QID) | INTRAMUSCULAR | Status: AC | PRN
  Administered 2017-08-14 (×2): 1 mg via INTRAVENOUS
  Filled 2017-08-14 (×2): qty 1

## 2017-08-14 MED ORDER — ACETAMINOPHEN 325 MG PO TABS
650.0000 mg | ORAL_TABLET | Freq: Four times a day (QID) | ORAL | Status: DC | PRN
  Administered 2017-08-14: 650 mg via ORAL
  Filled 2017-08-14: qty 2

## 2017-08-14 NOTE — Consult Note (Signed)
  Amiodarone Drug - Drug Interaction Consult Note  Recommendations:  Drug interaction with citalopram- increased risk of QTc prolongation. It is recommended to avoid the concomitant use. I would recommend a physiatry consult to switch to a different antidepressant such as duloxetine.  Amiodarone is metabolized by the cytochrome P450 system and therefore has the potential to cause many drug interactions. Amiodarone has an average plasma half-life of 50 days (range 20 to 100 days).   There is potential for drug interactions to occur several weeks or months after stopping treatment and the onset of drug interactions may be slow after initiating amiodarone.   []  Statins: Increased risk of myopathy. Simvastatin- restrict dose to 20mg  daily. Other statins: counsel patients to report any muscle pain or weakness immediately.  []  Anticoagulants: Amiodarone can increase anticoagulant effect. Consider warfarin dose reduction. Patients should be monitored closely and the dose of anticoagulant altered accordingly, remembering that amiodarone levels take several weeks to stabilize.  []  Antiepileptics: Amiodarone can increase plasma concentration of phenytoin, the dose should be reduced. Note that small changes in phenytoin dose can result in large changes in levels. Monitor patient and counsel on signs of toxicity.  [x]  Beta blockers: increased risk of bradycardia, AV block and myocardial depression. Sotalol - avoid concomitant use.  []   Calcium channel blockers (diltiazem and verapamil): increased risk of bradycardia, AV block and myocardial depression.  []   Cyclosporine: Amiodarone increases levels of cyclosporine. Reduced dose of cyclosporine is recommended.  []  Digoxin dose should be halved when amiodarone is started.  []  Diuretics: increased risk of cardiotoxicity if hypokalemia occurs.  []  Oral hypoglycemic agents (glyburide, glipizide, glimepiride): increased risk of hypoglycemia. Patient's  glucose levels should be monitored closely when initiating amiodarone therapy.   [x]  Drugs that prolong the QT interval:  Torsades de pointes risk may be increased with concurrent use - avoid if possible.  Monitor QTc, also keep magnesium/potassium WNL if concurrent therapy can't be avoided. Marland Kitchen. Antibiotics: e.g. fluoroquinolones, erythromycin. . Antiarrhythmics: e.g. quinidine, procainamide, disopyramide, sotalol. . Antipsychotics: e.g. phenothiazines, haloperidol.  . Lithium, tricyclic antidepressants, and methadone. Thank You,   Kurt FlossMelissa D Shanteria Horton, Pharm.D, BCPS Clinical Pharmacist  08/14/2017 11:25 AM

## 2017-08-14 NOTE — Progress Notes (Signed)
Cardizem drip increased to 15mg /hr.  Dig 0.25 mg ivp and Ativan 1 mg given for CIWA of 15.  HR remains at 155.  Dr. Allena KatzPatel aware. Patient c/o back pain and requesting pain medication, no narcotics ordered at this time.  Requesting additional medication for restlessness.  Dr. Allena KatzPatel is on the floor I wiolll discuss with him.

## 2017-08-14 NOTE — Progress Notes (Signed)
[employer] - [place] [facility]                                                                                                                                                                                  Patient Demographics   Kurt Horton, is a 57 y.o. male, DOB - 1960/06/10, XLK:[id number]  Admit date - 08/13/2017   Admitting Physician Oralia Manisavid Willis, MD  Outpatient Primary MD for the patient is Barbette ReichmannHande, Vishwanath, MD   LOS - 1  Subjective: Patient admitted with alcohol withdrawal and A. fib with RVR.  Heart rate continues to be elevated Is complaining of back pain   Review of Systems:   CONSTITUTIONAL: No documented fever. No fatigue, weakness. No weight gain, no weight loss.  EYES: No blurry or double vision.  ENT: No tinnitus. No postnasal drip. No redness of the oropharynx.  RESPIRATORY: No cough, no wheeze, no hemoptysis. No dyspnea.  CARDIOVASCULAR: No chest pain. No orthopnea. No palpitations. No syncope.  GASTROINTESTINAL: No nausea, no vomiting or diarrhea. No abdominal pain. No melena or hematochezia.  GENITOURINARY: No dysuria or hematuria.  ENDOCRINE: No polyuria or nocturia. No heat or cold intolerance.  HEMATOLOGY: No anemia. No bruising. No bleeding.  INTEGUMENTARY: No rashes. No lesions.  MUSCULOSKELETAL: Positive for back pain NEUROLOGIC: No numbness, tingling, or ataxia. No seizure-type activity.  PSYCHIATRIC: No anxiety. No insomnia. No ADD.    Vitals:   Vitals:   08/14/17 1130 08/14/17 1145 08/14/17 1200 08/14/17 1215  BP: 114/88 115/90 124/90 107/60  Pulse: (!) 119 (!) 115 (!) 128 (!) 150  Resp:      Temp:      TempSrc:      SpO2:      Weight:      Height:        Wt Readings from Last 3 Encounters:  08/14/17 83.2 kg (183 lb 6.8 oz)  06/22/17 86.2 kg (190 lb)  06/07/17 86.2 kg (190 lb)     Intake/Output Summary (Last 24 hours) at 08/14/2017 1438 Last data filed at 08/14/2017 1411 Gross per 24 hour  Intake 1470.26 ml   Output 655 ml  Net 815.26 ml    Physical Exam:   GENERAL: Pleasant-appearing in no apparent distress.  HEAD, EYES, EARS, NOSE AND THROAT: Atraumatic, normocephalic. Extraocular muscles are intact. Pupils equal and reactive to light. Sclerae anicteric. No conjunctival injection. No oro-pharyngeal erythema.  NECK: Supple. There is no jugular venous distention. No bruits, no lymphadenopathy, no thyromegaly.  HEART: Irregularly irregular,. No murmurs, no rubs, no clicks.  LUNGS: Clear to auscultation bilaterally. No rales or rhonchi. No wheezes.  ABDOMEN: Soft, flat, nontender, nondistended. Has good bowel  sounds. No hepatosplenomegaly appreciated.  EXTREMITIES: No evidence of any cyanosis, clubbing, or peripheral edema.  +2 pedal and radial pulses bilaterally.  NEUROLOGIC: The patient is alert, awake, and oriented x3 with no focal motor or sensory deficits appreciated bilaterally.  SKIN: Moist and warm with no rashes appreciated.  Psych: Not anxious, depressed LN: No inguinal LN enlargement    Antibiotics   Anti-infectives (From admission, onward)   None      Medications   Scheduled Meds: . enoxaparin (LOVENOX) injection  40 mg Subcutaneous Q24H  . folic acid  1 mg Oral Daily  . insulin aspart  0-5 Units Subcutaneous QHS  . insulin aspart  0-9 Units Subcutaneous TID WC  . LORazepam  0-4 mg Intravenous Q6H   Followed by  . [START ON 08/16/2017] LORazepam  0-4 mg Intravenous Q12H  . metoprolol succinate  50 mg Oral Daily  . multivitamin with minerals  1 tablet Oral Daily  . mycophenolate  500 mg Oral q morning - 10a   And  . mycophenolate  1,000 mg Oral QPM  . nicotine  21 mg Transdermal Daily  . potassium chloride  40 mEq Oral Q4H  . sodium chloride flush  3 mL Intravenous Q12H  . sodium chloride flush  3 mL Intravenous Q12H  . thiamine  100 mg Oral Daily   Or  . thiamine  100 mg Intravenous Daily   Continuous Infusions: . amiodarone 60 mg/hr (08/14/17 1050)   Followed  by  . amiodarone     PRN Meds:.acetaminophen **OR** acetaminophen, haloperidol **OR** haloperidol lactate, LORazepam **OR** LORazepam, meloxicam, ondansetron **OR** [DISCONTINUED] ondansetron (ZOFRAN) IV, sodium chloride flush, traMADol   Data Review:   Micro Results No results found for this or any previous visit (from the past 240 hour(s)).  Radiology Reports Dg Chest Port 1 View  Result Date: 08/13/2017 CLINICAL DATA:  Back pain. EXAM: PORTABLE CHEST 1 VIEW COMPARISON:  Radiographs of March 19, 2017. FINDINGS: Stable cardiomegaly. No pneumothorax or pleural effusion is noted. Both lungs are clear. The visualized skeletal structures are unremarkable. IMPRESSION: No acute cardiopulmonary abnormality seen. Electronically Signed   By: Lupita Raider, M.D.   On: 08/13/2017 18:18     CBC Recent Labs  Lab 08/13/17 1759 08/14/17 0112 08/14/17 0634  WBC 5.6 5.1 5.6  HGB 12.4* 11.1* 11.1*  HCT 40.0 35.7* 35.6*  PLT 166 127* 119*  MCV 74.5* 74.4* 74.3*  MCH 23.1* 23.1* 23.1*  MCHC 31.0* 31.1* 31.1*  RDW 21.5* 21.6* 21.9*  LYMPHSABS 2.9  --   --   MONOABS 0.4  --   --   EOSABS 0.0  --   --   BASOSABS 0.1  --   --     Chemistries  Recent Labs  Lab 08/13/17 1759 08/14/17 0112 08/14/17 0634  NA 139  --  134*  K 3.2*  --  3.3*  CL 101  --  99*  CO2 24  --  23  GLUCOSE 123*  --  103*  BUN 18  --  12  CREATININE 0.80 0.52* 0.67  CALCIUM 8.1*  --  7.8*  AST 64*  --   --   ALT 36  --   --   ALKPHOS 81  --   --   BILITOT 0.9  --   --    ------------------------------------------------------------------------------------------------------------------ estimated creatinine clearance is 103.1 mL/min (by C-G formula based on SCr of 0.67 mg/dL). ------------------------------------------------------------------------------------------------------------------ Recent Labs    08/14/17 0634  HGBA1C  5.8*    ------------------------------------------------------------------------------------------------------------------ No results for input(s): CHOL, HDL, LDLCALC, TRIG, CHOLHDL, LDLDIRECT in the last 72 hours. ------------------------------------------------------------------------------------------------------------------ No results for input(s): TSH, T4TOTAL, T3FREE, THYROIDAB in the last 72 hours.  Invalid input(s): FREET3 ------------------------------------------------------------------------------------------------------------------ No results for input(s): VITAMINB12, FOLATE, FERRITIN, TIBC, IRON, RETICCTPCT in the last 72 hours.  Coagulation profile No results for input(s): INR, PROTIME in the last 168 hours.  No results for input(s): DDIMER in the last 72 hours.  Cardiac Enzymes Recent Labs  Lab 08/14/17 0112 08/14/17 0634 08/14/17 1230  TROPONINI 0.05* 0.06* 0.05*   ------------------------------------------------------------------------------------------------------------------ Invalid input(s): POCBNP    Assessment & Plan  Patient is 57 year old with history of alcohol abuse #1 Atrial fibrillation with RVR (HCC) - Cardiology has switched him to amiodarone drip And I have given him 1 dose of digoxin Start aspirin Due to alcohol abuse and fall risk not a good candidate for anticoagulation  #2 Alcohol abuse   CIWA protocol  #3  Type II diabetes mellitus with manifestations (HCC) -siding scale insulin with corresponding glucose checks  #4  HTN (hypertension) -blood pressure currently stable  #5  Chronic combined systolic and diastolic CHF (congestive heart failure) (HCC) -continue home meds, cardiology consult   #6  Myasthenia gravis (HCC) -continue home meds continue CellCept        Code Status Orders  (From admission, onward)        Start     Ordered   08/14/17 0031  Full code  Continuous     08/14/17 0030    Code Status History    Date Active  Date Inactive Code Status Order ID Comments User Context   06/04/2017 0116 06/06/2017 1701 Full Code [id number]  Oralia Manis, MD Inpatient   03/19/2017 1559 03/20/2017 2128 Full Code [id number]  Arnaldo Natal, MD ED   02/25/2017 1421 03/01/2017 1829 Full Code [id number]  Houston Siren, MD Inpatient   11/12/2016 1533 11/14/2016 1425 Full Code [id number]  Marguarite Arbour, MD Inpatient   12/26/2015 1927 12/30/2015 1604 Full Code [id number]  Marguarite Arbour, MD Inpatient           Consults cardiology  DVT Prophylaxis  Lovenox   Lab Results  Component Value Date   PLT 119 (L) 08/14/2017     Time Spent in minutes 35 minutes  Greater than 50% of time spent in care coordination and counseling patient regarding the condition and plan of care.   Auburn Bilberry M.D on 08/14/2017 at 2:38 PM  Between 7am to 6pm - Pager - [phone]  After 6pm go to [email or web address] - Social research officer, government  [employer]   Office  [phone]

## 2017-08-14 NOTE — Progress Notes (Signed)
Patient arrived to 2A Room 250. Patient denies pain and all questions answered. Patient oriented to unit and use of call bell/room phone. Skin assessment completed with Lexi RN and skin intact. Patient difficult to arouse, but VSS and Afib on verified tele-box #[id number]. Sitter for IVC at bedside. Nursing staff will continue to monitor for any changes in patient status. Lamonte RicherKara A Faysal Fenoglio, RN

## 2017-08-14 NOTE — Consult Note (Signed)
Kurt Horton is a 57 y.o. male  [id number]  Primary Cardiologist: Dr. [name]  Reason for Consultation: Atrial fibrillation with RVR   HPI: 57yo male with a known medical history of alcohol abuse, HTN, DM2, myasthenia gravis, combined systolic-diasoltic CHF, presented to the ER with acute alcohol intoxication and was found to be in atrial fibrillation with RVR. He was hypotensive at the time which limited the use of rate controlling medications. After IV fluids had been administered diltiazem drip was titrated up to better control the rate.    Review of Systems: Reports severe back pain and nasal congestion but no shortness of breath, chest pain, or LE edema.   Past Medical History:  Diagnosis Date  . Alcohol abuse   . Alcohol withdrawal (HCC) 11/12/2016  . Diabetes mellitus without complication (HCC)   . DJD (degenerative joint disease)   . Hypertension   . Myasthenia gravis (HCC)   . Renal disorder     Medications Prior to Admission  Medication Sig Dispense Refill  . B Complex-C (B-COMPLEX WITH VITAMIN C) tablet Take 1 tablet by mouth daily.    . citalopram (CELEXA) 20 MG tablet Take 1 tablet (20 mg total) by mouth daily. 30 tablet 5  . furosemide (LASIX) 40 MG tablet Take 40 mg by mouth daily as needed for fluid (if taking prednisone).     . gabapentin (NEURONTIN) 300 MG capsule Take 1 capsule (300 mg total) by mouth 3 (three) times daily. (Patient taking differently: Take 600 mg 3 (three) times daily by mouth. ) 90 capsule 0  . lisinopril (PRINIVIL,ZESTRIL) 10 MG tablet Take 1 tablet (10 mg total) by mouth daily. 30 tablet 0  . metFORMIN (GLUCOPHAGE) 500 MG tablet Take 1 tablet (500 mg total) by mouth daily. 30 tablet 0  . mycophenolate (CELLCEPT) 500 MG tablet Take 1 tablet (500MG ) by mouth every morning and 2 tablets (1000MG ) by mouth every evening    . potassium chloride (K-DUR) 10 MEQ tablet Take 10 mEq by mouth daily.    Marland Kitchen torsemide (DEMADEX) 10 MG tablet Take 10  mg by mouth daily.    . carvedilol (COREG) 6.25 MG tablet Take 1 tablet (6.25 mg total) by mouth 2 (two) times daily with a meal. (Patient not taking: Reported on 06/07/2017) 60 tablet 2     . citalopram  20 mg Oral Daily  . enoxaparin (LOVENOX) injection  40 mg Subcutaneous Q24H  . folic acid  1 mg Oral Daily  . insulin aspart  0-5 Units Subcutaneous QHS  . insulin aspart  0-9 Units Subcutaneous TID WC  . LORazepam  0-4 mg Intravenous Q6H   Followed by  . [START ON 08/16/2017] LORazepam  0-4 mg Intravenous Q12H  . multivitamin with minerals  1 tablet Oral Daily  . mycophenolate  500 mg Oral q morning - 10a   And  . mycophenolate  1,000 mg Oral QPM  . nicotine  21 mg Transdermal Daily  . thiamine  100 mg Oral Daily   Or  . thiamine  100 mg Intravenous Daily    Infusions: . diltiazem (CARDIZEM) infusion 15 mg/hr (08/14/17 4098)    Allergies  Allergen Reactions  . Other Other (See Comments)    Patient states he can't take any "mycin" medications because myasthenia gravis.    Social History   Socioeconomic History  . Marital status: Legally Separated    Spouse name: Not on file  . Number of children: Not on file  .  Years of education: Not on file  . Highest education level: Not on file  Occupational History  . Not on file  Social Needs  . Financial resource strain: Not on file  . Food insecurity:    Worry: Not on file    Inability: Not on file  . Transportation needs:    Medical: Not on file    Non-medical: Not on file  Tobacco Use  . Smoking status: Current Every Day Smoker    Packs/day: 0.50    Types: Cigarettes  . Smokeless tobacco: Never Used  Substance and Sexual Activity  . Alcohol use: Yes    Comment: Pt had tequila and rum, at least a 5th  . Drug use: No  . Sexual activity: Not on file  Lifestyle  . Physical activity:    Days per week: Not on file    Minutes per session: Not on file  . Stress: Not on file  Relationships  . Social connections:     Talks on phone: Not on file    Gets together: Not on file    Attends religious service: Not on file    Active member of club or organization: Not on file    Attends meetings of clubs or organizations: Not on file    Relationship status: Not on file  . Intimate partner violence:    Fear of current or ex partner: Not on file    Emotionally abused: Not on file    Physically abused: Not on file    Forced sexual activity: Not on file  Other Topics Concern  . Not on file  Social History Narrative  . Not on file    Family History  Problem Relation Age of Onset  . Rheum arthritis Father     PHYSICAL EXAM: Vitals:   08/14/17 0619 08/14/17 0810  BP: 135/79 (!) 154/69  Pulse: (!) 139 (!) 132  Resp: 18 20  Temp: 98.2 F (36.8 C) 98.7 F (37.1 C)  SpO2: 95% 97%     Intake/Output Summary (Last 24 hours) at 08/14/2017 0846 Last data filed at 08/14/2017 1610 Gross per 24 hour  Intake 1177.38 ml  Output 0 ml  Net 1177.38 ml    General:  Well appearing. No respiratory difficulty HEENT: normal Neck: supple. no JVD. Carotids 2+ bilat; no bruits. No lymphadenopathy or thryomegaly appreciated. Cor: PMI nondisplaced. Regular rate & rhythm. No rubs, gallops or murmurs. Lungs: clear Abdomen: soft, nontender, nondistended. No hepatosplenomegaly. No bruits or masses. Good bowel sounds. Extremities: no cyanosis, clubbing, rash, edema Neuro: alert & oriented x 3, cranial nerves grossly intact. moves all 4 extremities w/o difficulty. Affect pleasant.  ECG: Atrial fibrillation with RVR 150bpm  Results for orders placed or performed during the hospital encounter of 08/13/17 (from the past 24 hour(s))  Comprehensive metabolic panel     Status: Abnormal   Collection Time: 08/13/17  5:59 PM  Result Value Ref Range   Sodium 139 135 - 145 mmol/L   Potassium 3.2 (L) 3.5 - 5.1 mmol/L   Chloride 101 101 - 111 mmol/L   CO2 24 22 - 32 mmol/L   Glucose, Bld 123 (H) 65 - 99 mg/dL   BUN 18 6 - 20 mg/dL    Creatinine, Ser 9.60 0.61 - 1.24 mg/dL   Calcium 8.1 (L) 8.9 - 10.3 mg/dL   Total Protein 6.9 6.5 - 8.1 g/dL   Albumin 4.0 3.5 - 5.0 g/dL   AST 64 (H) 15 - 41 U/L  ALT 36 17 - 63 U/L   Alkaline Phosphatase 81 38 - 126 U/L   Total Bilirubin 0.9 0.3 - 1.2 mg/dL   GFR calc non Af Amer >60 >60 mL/min   GFR calc Af Amer >60 >60 mL/min   Anion gap 14 5 - 15  Acetaminophen level     Status: Abnormal   Collection Time: 08/13/17  5:59 PM  Result Value Ref Range   Acetaminophen (Tylenol), Serum <10 (L) 10 - 30 ug/mL  Ethanol     Status: Abnormal   Collection Time: 08/13/17  5:59 PM  Result Value Ref Range   Alcohol, Ethyl (B) 443 (HH) <10 mg/dL  Salicylate level     Status: None   Collection Time: 08/13/17  5:59 PM  Result Value Ref Range   Salicylate Lvl <7.0 2.8 - 30.0 mg/dL  Troponin I     Status: Abnormal   Collection Time: 08/13/17  5:59 PM  Result Value Ref Range   Troponin I 0.05 (HH) <0.03 ng/mL  CBC with Differential     Status: Abnormal   Collection Time: 08/13/17  5:59 PM  Result Value Ref Range   WBC 5.6 3.8 - 10.6 K/uL   RBC 5.37 4.40 - 5.90 MIL/uL   Hemoglobin 12.4 (L) 13.0 - 18.0 g/dL   HCT 16.1 09.6 - 04.5 %   MCV 74.5 (L) 80.0 - 100.0 fL   MCH 23.1 (L) 26.0 - 34.0 pg   MCHC 31.0 (L) 32.0 - 36.0 g/dL   RDW 40.9 (H) 81.1 - 91.4 %   Platelets 166 150 - 440 K/uL   Neutrophils Relative % 40 %   Lymphocytes Relative 50 %   Monocytes Relative 8 %   Eosinophils Relative 0 %   Basophils Relative 2 %   Neutro Abs 2.2 1.4 - 6.5 K/uL   Lymphs Abs 2.9 1.0 - 3.6 K/uL   Monocytes Absolute 0.4 0.2 - 1.0 K/uL   Eosinophils Absolute 0.0 0 - 0.7 K/uL   Basophils Absolute 0.1 0 - 0.1 K/uL   RBC Morphology MIXED RBC POPULATION    WBC Morphology ATYPICAL LYMPHOCYTES   Digoxin level     Status: Abnormal   Collection Time: 08/13/17  5:59 PM  Result Value Ref Range   Digoxin Level <0.2 (L) 0.8 - 2.0 ng/mL  Brain natriuretic peptide     Status: None   Collection Time:  08/14/17  1:12 AM  Result Value Ref Range   B Natriuretic Peptide 65.0 0.0 - 100.0 pg/mL  CBC     Status: Abnormal   Collection Time: 08/14/17  1:12 AM  Result Value Ref Range   WBC 5.1 3.8 - 10.6 K/uL   RBC 4.79 4.40 - 5.90 MIL/uL   Hemoglobin 11.1 (L) 13.0 - 18.0 g/dL   HCT 78.2 (L) 95.6 - 21.3 %   MCV 74.4 (L) 80.0 - 100.0 fL   MCH 23.1 (L) 26.0 - 34.0 pg   MCHC 31.1 (L) 32.0 - 36.0 g/dL   RDW 08.6 (H) 57.8 - 46.9 %   Platelets 127 (L) 150 - 440 K/uL  Creatinine, serum     Status: Abnormal   Collection Time: 08/14/17  1:12 AM  Result Value Ref Range   Creatinine, Ser 0.52 (L) 0.61 - 1.24 mg/dL   GFR calc non Af Amer >60 >60 mL/min   GFR calc Af Amer >60 >60 mL/min  Troponin I     Status: Abnormal   Collection Time: 08/14/17  1:12 AM  Result Value Ref Range   Troponin I 0.05 (HH) <0.03 ng/mL  Troponin I     Status: Abnormal   Collection Time: 08/14/17  6:34 AM  Result Value Ref Range   Troponin I 0.06 (HH) <0.03 ng/mL  Basic metabolic panel     Status: Abnormal   Collection Time: 08/14/17  6:34 AM  Result Value Ref Range   Sodium 134 (L) 135 - 145 mmol/L   Potassium 3.3 (L) 3.5 - 5.1 mmol/L   Chloride 99 (L) 101 - 111 mmol/L   CO2 23 22 - 32 mmol/L   Glucose, Bld 103 (H) 65 - 99 mg/dL   BUN 12 6 - 20 mg/dL   Creatinine, Ser 4.090.67 0.61 - 1.24 mg/dL   Calcium 7.8 (L) 8.9 - 10.3 mg/dL   GFR calc non Af Amer >60 >60 mL/min   GFR calc Af Amer >60 >60 mL/min   Anion gap 12 5 - 15  CBC     Status: Abnormal   Collection Time: 08/14/17  6:34 AM  Result Value Ref Range   WBC 5.6 3.8 - 10.6 K/uL   RBC 4.79 4.40 - 5.90 MIL/uL   Hemoglobin 11.1 (L) 13.0 - 18.0 g/dL   HCT 81.135.6 (L) 91.440.0 - 78.252.0 %   MCV 74.3 (L) 80.0 - 100.0 fL   MCH 23.1 (L) 26.0 - 34.0 pg   MCHC 31.1 (L) 32.0 - 36.0 g/dL   RDW 95.621.9 (H) 21.311.5 - 08.614.5 %   Platelets 119 (L) 150 - 440 K/uL  Glucose, capillary     Status: Abnormal   Collection Time: 08/14/17  8:30 AM  Result Value Ref Range   Glucose-Capillary  125 (H) 65 - 99 mg/dL   Dg Chest Port 1 View  Result Date: 08/13/2017 CLINICAL DATA:  Back pain. EXAM: PORTABLE CHEST 1 VIEW COMPARISON:  Radiographs of March 19, 2017. FINDINGS: Stable cardiomegaly. No pneumothorax or pleural effusion is noted. Both lungs are clear. The visualized skeletal structures are unremarkable. IMPRESSION: No acute cardiopulmonary abnormality seen. Electronically Signed   By: Lupita RaiderJames  Green Jr, M.D.   On: 08/13/2017 18:18     ASSESSMENT AND PLAN: Atrial fibrillation with RVR and alcoholic cardiomyopathy. Troponin mildly elevated likely due to demand ischemia. Advise repeat echocardiogram. Will start amiodarone drip to attempt medical conversion from AF. Replace potassium. Cannot start anticoagulation at this time due to history of ETOH abuse and risk of bleeding.   Caroleen HammanKristin Jordin Dambrosio, NP-C Cell: [phone]

## 2017-08-15 DIAGNOSIS — F1010 Alcohol abuse, uncomplicated: Secondary | ICD-10-CM

## 2017-08-15 LAB — GLUCOSE, CAPILLARY
GLUCOSE-CAPILLARY: 105 mg/dL — AB (ref 65–99)
GLUCOSE-CAPILLARY: 136 mg/dL — AB (ref 65–99)
Glucose-Capillary: 126 mg/dL — ABNORMAL HIGH (ref 65–99)
Glucose-Capillary: 152 mg/dL — ABNORMAL HIGH (ref 65–99)
Glucose-Capillary: 112 mg/dL — ABNORMAL HIGH (ref 65–99)

## 2017-08-15 LAB — BASIC METABOLIC PANEL
ANION GAP: 8 (ref 5–15)
BUN: 10 mg/dL (ref 6–20)
CHLORIDE: 102 mmol/L (ref 101–111)
CO2: 25 mmol/L (ref 22–32)
CREATININE: 0.62 mg/dL (ref 0.61–1.24)
Calcium: 9 mg/dL (ref 8.9–10.3)
GFR calc non Af Amer: >60 mL/min (ref >60)
Glucose, Bld: 132 mg/dL — ABNORMAL HIGH (ref 65–99)
Potassium: 3.8 mmol/L (ref 3.5–5.1)
Sodium: 135 mmol/L (ref 135–145)

## 2017-08-15 LAB — MAGNESIUM: Magnesium: 1.9 mg/dL (ref 1.7–2.4)

## 2017-08-15 MED ORDER — LORAZEPAM 2 MG/ML IJ SOLN
2.0000 mg | Freq: Every day | INTRAMUSCULAR | Status: AC
  Administered 2017-08-15: 2 mg via INTRAVENOUS
  Filled 2017-08-15: qty 1

## 2017-08-15 MED ORDER — LORAZEPAM 2 MG/ML IJ SOLN
2.0000 mg | Freq: Once | INTRAMUSCULAR | Status: AC
  Administered 2017-08-15: 2 mg via INTRAVENOUS
  Filled 2017-08-15: qty 1

## 2017-08-15 MED ORDER — AMIODARONE HCL 200 MG PO TABS
400.0000 mg | ORAL_TABLET | Freq: Two times a day (BID) | ORAL | Status: DC
  Administered 2017-08-15 – 2017-08-18 (×7): 400 mg via ORAL
  Filled 2017-08-15 (×7): qty 2

## 2017-08-15 MED ORDER — ASPIRIN EC 325 MG PO TBEC
325.0000 mg | DELAYED_RELEASE_TABLET | Freq: Every day | ORAL | Status: DC
  Administered 2017-08-15 – 2017-08-18 (×4): 325 mg via ORAL
  Filled 2017-08-15 (×4): qty 1

## 2017-08-15 NOTE — Consult Note (Signed)
[facility] Consult   Reason for Consult: Consult for 57 year old man with a history of alcohol abuse currently in the hospital with detox and atrial fibrillation Referring Physician: Posey Pronto Patient Identification: Kurt Horton MRN:  [id number] Principal Diagnosis: Alcohol abuse Diagnosis:   Patient Active Problem List   Diagnosis Date Noted  . Alcohol abuse [F10.10]     Priority: High  . Alcohol withdrawal ([place]) [F10.239] 11/13/2016    Priority: High  . Chronic combined systolic and diastolic CHF (congestive heart failure) ([place]) [I50.42] 08/13/2017  . Alcohol withdrawal delirium ([place]) [F10.231] 02/26/2017  . Atrial fibrillation with RVR ([place]) [I48.91] 02/25/2017  . Acute alcoholic intoxication with complication ([place]) [H84.696]   . Substance induced mood disorder ([place]) [F19.94] 11/13/2016  . SVT (supraventricular tachycardia) ([place]) [I47.1] 11/12/2016  . HTN (hypertension) [I10] 11/12/2016  . EtOH dependence ([place]) [F10.20] 11/12/2016  . Lumbar radiculopathy [M54.16] 08/24/2016  . Depression with anxiety [F41.8] 08/24/2016  . Chronic low back pain [M54.5, G89.29] 01/04/2016  . Pressure ulcer [L89.90] 12/29/2015  . Osteomyelitis ([place]) [M86.9] 12/26/2015  . PVD (peripheral vascular disease) ([place]) [I73.9] 12/26/2015  . Type II diabetes mellitus with manifestations ([place]) [E11.8] 12/26/2015  . Myasthenia gravis ([place]) [G70.00] 12/26/2015    Total Time spent with patient: 1 hour  Subjective:   Kurt Horton is a 57 y.o. male patient admitted with "can I go home".  HPI: Patient interviewed, chart reviewed.  Patient known from multiple prior encounters.  58 year old man with alcohol dependence presented to the emergency room the night before last acutely intoxicated and was found to be in atrial fibrillation.  Patient was put under IVC by his parents in order to get him into the hospital because of his acute intoxication and poor self-care.  Patient tells me that he had been  sober for 9 weeks and that he only relapsed because he found out that his ex-wife was seeing someone else.  Patient admits that he was drinking heavily for a couple days.  Denies using any other drugs.  He is requesting immediately today that I let him go home.  His rationale for this is that he needs to start doing things to get ready to go to his son's wedding in May.  None of it makes much sense.  He denies suicidal thoughts.  Denies that he is having hallucinations.  He does have some rudimentary understanding that he has a heart arrhythmia but does not really understand the consequences of it.  Social history: Not able to work.  Lives with his elderly parents.  He claims that he had been sober for about 9 weeks although apparently all he was doing during that time was sitting around the house.  Medical history: Patient has diabetes history of myasthenia gravis as a past history of delirium tremens.  History of seizures.  Now appears to have atrial fibrillation.  Substance abuse history: Long-standing alcohol abuse with multiple medical complications.  Patient is not a very reliable historian.  Last time I spoke with him he swore he was going to a long-term facility but I do not think that ever happened.  As noted he has a history of seizures and delirium tremens.  Only occasionally involved in treatment.  Past Psychiatric History: History of depression although mostly related to his alcohol abuse.  Has been on antidepressants mainly citalopram recently.  Not clear that it has really been of benefit.  No history of suicide attempts.  Risk to Self: Is patient at risk  for suicide?: No Risk to Others:   Prior Inpatient Therapy:   Prior Outpatient Therapy:    Past Medical History:  Past Medical History:  Diagnosis Date  . Alcohol abuse   . Alcohol withdrawal ([place]) 11/12/2016  . Diabetes mellitus without complication ([place])   . DJD (degenerative joint disease)   . Hypertension   . Myasthenia  gravis ([place])   . Renal disorder     Past Surgical History:  Procedure Laterality Date  . AMPUTATION TOE Left 12/29/2015   Procedure: AMPUTATION TOE;  Surgeon: Samara Deist, DPM;  Location: [facility] ORS;  Service: Podiatry;  Laterality: Left;  . PERIPHERAL VASCULAR CATHETERIZATION N/A 12/27/2015   Procedure: Lower Extremity Angiography;  Surgeon: Algernon Huxley, MD;  Location: [facility];  Service: Cardiovascular;  Laterality: N/A;   Family History:  Family History  Problem Relation Age of Onset  . Rheum arthritis Father    Family Psychiatric  History: Depression Social History:  Social History   Substance and Sexual Activity  Alcohol Use Yes   Comment: Pt had tequila and rum, at least a 5th     Social History   Substance and Sexual Activity  Drug Use No    Social History   Socioeconomic History  . Marital status: Legally Separated    Spouse name: Not on file  . Number of children: Not on file  . Years of education: Not on file  . Highest education level: Not on file  Occupational History  . Not on file  Social Needs  . Financial resource strain: Not on file  . Food insecurity:    Worry: Not on file    Inability: Not on file  . Transportation needs:    Medical: Not on file    Non-medical: Not on file  Tobacco Use  . Smoking status: Current Every Day Smoker    Packs/day: 0.50    Types: Cigarettes  . Smokeless tobacco: Never Used  Substance and Sexual Activity  . Alcohol use: Yes    Comment: Pt had tequila and rum, at least a 5th  . Drug use: No  . Sexual activity: Not on file  Lifestyle  . Physical activity:    Days per week: Not on file    Minutes per session: Not on file  . Stress: Not on file  Relationships  . Social connections:    Talks on phone: Not on file    Gets together: Not on file    Attends religious service: Not on file    Active member of club or organization: Not on file    Attends meetings of clubs or organizations: Not on file     Relationship status: Not on file  Other Topics Concern  . Not on file  Social History Narrative  . Not on file   Additional Social History:    Allergies:   Allergies  Allergen Reactions  . Other Other (See Comments)    Patient states he can't take any "mycin" medications because myasthenia gravis.    Labs:  Results for orders placed or performed during the hospital encounter of 08/13/17 (from the past 48 hour(s))  Comprehensive metabolic panel     Status: Abnormal   Collection Time: 08/13/17  5:59 PM  Result Value Ref Range   Sodium 139 135 - 145 mmol/L   Potassium 3.2 (L) 3.5 - 5.1 mmol/L   Chloride 101 101 - 111 mmol/L   CO2 24 22 - 32 mmol/L   Glucose,  Bld 123 (H) 65 - 99 mg/dL   BUN 18 6 - 20 mg/dL   Creatinine, Ser 0.80 0.61 - 1.24 mg/dL   Calcium 8.1 (L) 8.9 - 10.3 mg/dL   Total Protein 6.9 6.5 - 8.1 g/dL   Albumin 4.0 3.5 - 5.0 g/dL   AST 64 (H) 15 - 41 U/L   ALT 36 17 - 63 U/L   Alkaline Phosphatase 81 38 - 126 U/L   Total Bilirubin 0.9 0.3 - 1.2 mg/dL   GFR calc non Af Amer >60 >60 mL/min   GFR calc Af Amer >60 >60 mL/min    Comment: (NOTE) The eGFR has been calculated using the CKD EPI equation. This calculation has not been validated in all clinical situations. eGFR's persistently <60 mL/min signify possible Chronic Kidney Disease.    Anion gap 14 5 - 15    Comment: Performed at [facility], [address] [place], [place] [address]  Acetaminophen level     Status: Abnormal   Collection Time: 08/13/17  5:59 PM  Result Value Ref Range   Acetaminophen (Tylenol), Serum <10 (L) 10 - 30 ug/mL    Comment:        THERAPEUTIC CONCENTRATIONS VARY SIGNIFICANTLY. A RANGE OF 10-30 ug/mL MAY BE AN EFFECTIVE CONCENTRATION FOR MANY PATIENTS. HOWEVER, SOME ARE BEST TREATED AT CONCENTRATIONS OUTSIDE THIS RANGE. ACETAMINOPHEN CONCENTRATIONS >150 ug/mL AT 4 HOURS AFTER INGESTION AND >50 ug/mL AT 12 HOURS AFTER INGESTION ARE OFTEN ASSOCIATED WITH  TOXIC REACTIONS. Performed at [facility], [address] [place], [place] [address]   Ethanol     Status: Abnormal   Collection Time: 08/13/17  5:59 PM  Result Value Ref Range   Alcohol, Ethyl (B) 443 (HH) <10 mg/dL    Comment: CRITICAL RESULT CALLED TO, READ BACK BY AND VERIFIED WITH LORI LEMONS AT 1930 08/13/2017.  TFK        LOWEST DETECTABLE LIMIT FOR SERUM ALCOHOL IS 10 mg/dL FOR MEDICAL PURPOSES ONLY Performed at [facility], [address] [place], [place] [address]   Salicylate level     Status: None   Collection Time: 08/13/17  5:59 PM  Result Value Ref Range   Salicylate Lvl <1.9 2.8 - 30.0 mg/dL    Comment: Performed at [facility], [address] [place], [place] [address]  Troponin I     Status: Abnormal   Collection Time: 08/13/17  5:59 PM  Result Value Ref Range   Troponin I 0.05 (HH) <0.03 ng/mL    Comment: CRITICAL RESULT CALLED TO, READ BACK BY AND VERIFIED WITH: LAURIE LEMONS @ 2034 ON 08/13/2017 BY CAF Performed at [facility], [address] [place], [place] [address]   CBC with Differential     Status: Abnormal   Collection Time: 08/13/17  5:59 PM  Result Value Ref Range   WBC 5.6 3.8 - 10.6 K/uL   RBC 5.37 4.40 - 5.90 MIL/uL   Hemoglobin 12.4 (L) 13.0 - 18.0 g/dL   HCT 40.0 40.0 - 52.0 %   MCV 74.5 (L) 80.0 - 100.0 fL   MCH 23.1 (L) 26.0 - 34.0 pg   MCHC 31.0 (L) 32.0 - 36.0 g/dL   RDW 21.5 (H) 11.5 - 14.5 %   Platelets 166 150 - 440 K/uL   Neutrophils Relative % 40 %   Lymphocytes Relative 50 %   Monocytes Relative 8 %   Eosinophils Relative 0 %   Basophils Relative 2 %  Neutro Abs 2.2 1.4 - 6.5 K/uL   Lymphs Abs 2.9 1.0 - 3.6 K/uL   Monocytes Absolute 0.4 0.2 - 1.0 K/uL   Eosinophils Absolute 0.0 0 - 0.7 K/uL   Basophils Absolute 0.1 0 - 0.1 K/uL   RBC Morphology MIXED RBC POPULATION    WBC Morphology ATYPICAL LYMPHOCYTES     Comment: Performed at [facility], [address]  [place], [place] [address]  Digoxin level     Status: Abnormal   Collection Time: 08/13/17  5:59 PM  Result Value Ref Range   Digoxin Level <0.2 (L) 0.8 - 2.0 ng/mL    Comment: Performed at [facility], [address] [place], [place] [address]  Brain natriuretic peptide     Status: None   Collection Time: 08/14/17  1:12 AM  Result Value Ref Range   B Natriuretic Peptide 65.0 0.0 - 100.0 pg/mL    Comment: Performed at [facility], [address] [place], [place] [address]  CBC     Status: Abnormal   Collection Time: 08/14/17  1:12 AM  Result Value Ref Range   WBC 5.1 3.8 - 10.6 K/uL   RBC 4.79 4.40 - 5.90 MIL/uL   Hemoglobin 11.1 (L) 13.0 - 18.0 g/dL   HCT 35.7 (L) 40.0 - 52.0 %   MCV 74.4 (L) 80.0 - 100.0 fL   MCH 23.1 (L) 26.0 - 34.0 pg   MCHC 31.1 (L) 32.0 - 36.0 g/dL   RDW 21.6 (H) 11.5 - 14.5 %   Platelets 127 (L) 150 - 440 K/uL    Comment: Performed at [facility], [address] [place], [place] [address]  Creatinine, serum     Status: Abnormal   Collection Time: 08/14/17  1:12 AM  Result Value Ref Range   Creatinine, Ser 0.52 (L) 0.61 - 1.24 mg/dL   GFR calc non Af Amer >60 >60 mL/min   GFR calc Af Amer >60 >60 mL/min    Comment: (NOTE) The eGFR has been calculated using the CKD EPI equation. This calculation has not been validated in all clinical situations. eGFR's persistently <60 mL/min signify possible Chronic Kidney Disease. Performed at [facility], [address] [place], [place] [address]   Troponin I     Status: Abnormal   Collection Time: 08/14/17  1:12 AM  Result Value Ref Range   Troponin I 0.05 (HH) <0.03 ng/mL    Comment: CRITICAL VALUE NOTED. VALUE IS CONSISTENT WITH PREVIOUSLY REPORTED/CALLED VALUE. JAG Performed at [facility], [address] [place], [place] [address]   Troponin I     Status: Abnormal   Collection Time: 08/14/17  6:34 AM  Result Value Ref Range   Troponin I 0.06 (HH) <0.03  ng/mL    Comment: CRITICAL VALUE NOTED. VALUE IS CONSISTENT WITH PREVIOUSLY REPORTED/CALLED VALUE...[facility] Performed at [facility], [address] [place], [place] [address]   Basic metabolic panel     Status: Abnormal   Collection Time: 08/14/17  6:34 AM  Result Value Ref Range   Sodium 134 (L) 135 - 145 mmol/L   Potassium 3.3 (L) 3.5 - 5.1 mmol/L   Chloride 99 (L) 101 - 111 mmol/L   CO2 23 22 - 32 mmol/L   Glucose, Bld 103 (H) 65 - 99 mg/dL   BUN 12 6 - 20 mg/dL   Creatinine, Ser 0.67 0.61 - 1.24 mg/dL   Calcium 7.8 (L) 8.9 - 10.3 mg/dL   GFR calc  non Af Amer >60 >60 mL/min   GFR calc Af Amer >60 >60 mL/min    Comment: (NOTE) The eGFR has been calculated using the CKD EPI equation. This calculation has not been validated in all clinical situations. eGFR's persistently <60 mL/min signify possible Chronic Kidney Disease.    Anion gap 12 5 - 15    Comment: Performed at [facility], [address] [place], [place] [address]  CBC     Status: Abnormal   Collection Time: 08/14/17  6:34 AM  Result Value Ref Range   WBC 5.6 3.8 - 10.6 K/uL   RBC 4.79 4.40 - 5.90 MIL/uL   Hemoglobin 11.1 (L) 13.0 - 18.0 g/dL   HCT 35.6 (L) 40.0 - 52.0 %   MCV 74.3 (L) 80.0 - 100.0 fL   MCH 23.1 (L) 26.0 - 34.0 pg   MCHC 31.1 (L) 32.0 - 36.0 g/dL   RDW 21.9 (H) 11.5 - 14.5 %   Platelets 119 (L) 150 - 440 K/uL    Comment: Performed at [facility], [address] [place], [place] [address]  Hemoglobin A1c     Status: Abnormal   Collection Time: 08/14/17  6:34 AM  Result Value Ref Range   Hgb A1c MFr Bld 5.8 (H) 4.8 - 5.6 %    Comment: (NOTE) Pre diabetes:          5.7%-6.4% Diabetes:              >6.4% Glycemic control for   <7.0% adults with diabetes    Mean Plasma Glucose 119.76 mg/dL    Comment: Performed at [facility], [address] [place], [place] [address]  Glucose, capillary     Status: Abnormal   Collection Time: 08/14/17  8:30 AM  Result  Value Ref Range   Glucose-Capillary 125 (H) 65 - 99 mg/dL  Glucose, capillary     Status: Abnormal   Collection Time: 08/14/17 11:58 AM  Result Value Ref Range   Glucose-Capillary 116 (H) 65 - 99 mg/dL  Troponin I     Status: Abnormal   Collection Time: 08/14/17 12:30 PM  Result Value Ref Range   Troponin I 0.05 (HH) <0.03 ng/mL    Comment: CRITICAL VALUE NOTED. VALUE IS CONSISTENT WITH PREVIOUSLY REPORTED/CALLED VALUE KLW Performed at [facility], [address] [place], [place] [address]   Glucose, capillary     Status: Abnormal   Collection Time: 08/14/17  4:50 PM  Result Value Ref Range   Glucose-Capillary 170 (H) 65 - 99 mg/dL  C difficile quick scan w PCR reflex     Status: None   Collection Time: 08/14/17  5:41 PM  Result Value Ref Range   C Diff antigen NEGATIVE NEGATIVE   C Diff toxin NEGATIVE NEGATIVE   C Diff interpretation No C. difficile detected.     Comment: VALID Performed at [facility], [address] [place], [place] [address]   Glucose, capillary     Status: Abnormal   Collection Time: 08/14/17  9:20 PM  Result Value Ref Range   Glucose-Capillary 105 (H) 65 - 99 mg/dL   Comment 1 Notify RN    Comment 2 Document in Chart   Basic metabolic panel     Status: Abnormal   Collection Time: 08/15/17  4:26 AM  Result Value Ref Range   Sodium 135 135 - 145 mmol/L   Potassium 3.8 3.5 - 5.1 mmol/L   Chloride 102 101 -  111 mmol/L   CO2 25 22 - 32 mmol/L   Glucose, Bld 132 (H) 65 - 99 mg/dL   BUN 10 6 - 20 mg/dL   Creatinine, Ser 0.62 0.61 - 1.24 mg/dL   Calcium 9.0 8.9 - 10.3 mg/dL   GFR calc non Af Amer >60 >60 mL/min   GFR calc Af Amer >60 >60 mL/min    Comment: (NOTE) The eGFR has been calculated using the CKD EPI equation. This calculation has not been validated in all clinical situations. eGFR's persistently <60 mL/min signify possible Chronic Kidney Disease.    Anion gap 8 5 - 15    Comment: Performed at [facility],  [address] [place], [place] [address]  Magnesium     Status: None   Collection Time: 08/15/17  4:26 AM  Result Value Ref Range   Magnesium 1.9 1.7 - 2.4 mg/dL    Comment: Performed at [facility], [address] [place], [place] [address]  Glucose, capillary     Status: Abnormal   Collection Time: 08/15/17  7:28 AM  Result Value Ref Range   Glucose-Capillary 126 (H) 65 - 99 mg/dL  Glucose, capillary     Status: Abnormal   Collection Time: 08/15/17 11:46 AM  Result Value Ref Range   Glucose-Capillary 136 (H) 65 - 99 mg/dL  Glucose, capillary     Status: Abnormal   Collection Time: 08/15/17  5:02 PM  Result Value Ref Range   Glucose-Capillary 152 (H) 65 - 99 mg/dL    Current Facility-Administered Medications  Medication Dose Route Frequency Provider Last Rate Last Dose  . acetaminophen (TYLENOL) tablet 650 mg  650 mg Oral Q6H PRN Lance Coon, MD   650 mg at 08/14/17 0423   Or  . acetaminophen (TYLENOL) suppository 650 mg  650 mg Rectal Q6H PRN Lance Coon, MD      . amiodarone (PACERONE) tablet 400 mg  400 mg Oral BID Jake Bathe, FNP   400 mg at 08/15/17 1148  . aspirin EC tablet 325 mg  325 mg Oral Daily Dustin Flock, MD   325 mg at 08/15/17 1608  . enoxaparin (LOVENOX) injection 40 mg  40 mg Subcutaneous Q24H Lance Coon, MD   40 mg at 08/15/17 0008  . folic acid (FOLVITE) tablet 1 mg  1 mg Oral Daily Lance Coon, MD   1 mg at 08/15/17 5956  . haloperidol (HALDOL) tablet 2 mg  2 mg Oral Q4H PRN Dustin Flock, MD       Or  . haloperidol lactate (HALDOL) injection 2 mg  2 mg Intramuscular Q4H PRN Dustin Flock, MD      . insulin aspart (novoLOG) injection 0-5 Units  0-5 Units Subcutaneous QHS Lance Coon, MD      . insulin aspart (novoLOG) injection 0-9 Units  0-9 Units Subcutaneous TID WC Lance Coon, MD   1 Units at 08/15/17 1207  . LORazepam (ATIVAN) injection 0-4 mg  0-4 mg Intravenous Q6H Lance Coon, MD   1 mg at 08/15/17 1149    Followed by  . [START ON 08/16/2017] LORazepam (ATIVAN) injection 0-4 mg  0-4 mg Intravenous Laurence Spates, MD      . LORazepam (ATIVAN) tablet 1 mg  1 mg Oral Q6H PRN Lance Coon, MD   1 mg at 08/14/17 2039   Or  . LORazepam (ATIVAN) injection 1 mg  1 mg Intravenous Q6H PRN Lance Coon, MD   1 mg at 08/14/17 0915  .  LORazepam (ATIVAN) injection 2 mg  2 mg Intravenous QHS Clapacs, Shafter T, MD      . meloxicam (MOBIC) tablet 7.5 mg  7.5 mg Oral BID PRN Harrie Foreman, MD   7.5 mg at 08/15/17 0008  . metoprolol succinate (TOPROL-XL) 24 hr tablet 50 mg  50 mg Oral Daily Neoma Laming A, MD   50 mg at 08/15/17 0834  . multivitamin with minerals tablet 1 tablet  1 tablet Oral Daily Lance Coon, MD   1 tablet at 08/15/17 [phone]  . mycophenolate (CELLCEPT) capsule 500 mg  500 mg Oral q morning - 10a Lance Coon, MD   500 mg at 08/15/17 1740   And  . mycophenolate (CELLCEPT) capsule 1,000 mg  1,000 mg Oral QPM Lance Coon, MD   1,000 mg at 08/14/17 1800  . nicotine (NICODERM CQ - dosed in mg/24 hours) patch 21 mg  21 mg Transdermal Daily Harrie Foreman, MD   21 mg at 08/15/17 8144  . ondansetron (ZOFRAN) tablet 4 mg  4 mg Oral Q6H PRN Lance Coon, MD      . sodium chloride flush (NS) 0.9 % injection 3 mL  3 mL Intravenous Q12H Dustin Flock, MD   3 mL at 08/15/17 0930  . sodium chloride flush (NS) 0.9 % injection 3 mL  3 mL Intravenous Q12H Dustin Flock, MD   3 mL at 08/15/17 0839  . sodium chloride flush (NS) 0.9 % injection 3 mL  3 mL Intravenous PRN Dustin Flock, MD      . thiamine (VITAMIN B-1) tablet 100 mg  100 mg Oral Daily Lance Coon, MD   100 mg at 08/15/17 8185   Or  . thiamine (B-1) injection 100 mg  100 mg Intravenous Daily Lance Coon, MD      . traMADol Veatrice Bourbon) tablet 50 mg  50 mg Oral Q6H PRN Dustin Flock, MD   50 mg at 08/15/17 1421    Musculoskeletal: Strength & Muscle Tone: decreased Gait & Station: unable to stand Patient leans:  Backward  Psychiatric Specialty Exam: Physical Exam  Nursing note and vitals reviewed. Constitutional: He appears well-developed. He appears distressed.  HENT:  Head: Normocephalic and atraumatic.  Eyes: Pupils are equal, round, and reactive to light. Conjunctivae are normal.  Neck: Normal range of motion.  Cardiovascular: Normal heart sounds.  Respiratory: Effort normal. No respiratory distress.  GI: Soft.  Musculoskeletal: Normal range of motion.  Neurological: He is alert. He displays tremor.  Skin: Skin is warm and dry.  Psychiatric: His mood appears anxious. His speech is delayed and tangential. He is agitated. He is not aggressive. Thought content is not paranoid. Cognition and memory are impaired. He expresses impulsivity and inappropriate judgment. He expresses no homicidal and no suicidal ideation. He exhibits abnormal recent memory and abnormal remote memory.    Review of Systems  Constitutional: Positive for diaphoresis.  HENT: Negative.   Eyes: Negative.   Respiratory: Negative.   Cardiovascular: Negative.   Gastrointestinal: Negative.   Musculoskeletal: Negative.   Skin: Negative.   Neurological: Positive for tremors.  Psychiatric/Behavioral: Positive for memory loss and substance abuse. Negative for depression, hallucinations and suicidal ideas. The patient is nervous/anxious. The patient does not have insomnia.     Blood pressure (!) 141/88, pulse 86, temperature 98.3 F (36.8 C), temperature source Oral, resp. rate 18, height '5\' 9"'$  (1.753 m), weight 81.3 kg (179 lb 3.2 oz), SpO2 99 %.Body mass index is 26.46 kg/m.  General Appearance: Disheveled  Eye Contact:  Fair  Speech:  Garbled  Volume:  Decreased  Mood:  Anxious and Dysphoric  Affect:  Constricted and Inappropriate  Thought Process:  Disorganized  Orientation:  Full (Time, Place, and Person)  Thought Content:  Illogical, Rumination and Tangential  Suicidal Thoughts:  No  Homicidal Thoughts:  No   Memory:  Immediate;   Fair Recent;   Poor Remote;   Poor  Judgement:  Poor  Insight:  Shallow  Psychomotor Activity:  Restlessness and Tremor  Concentration:  Concentration: Poor  Recall:  Poor  Fund of Knowledge:  Poor  Language:  Poor  Akathisia:  No  Handed:  Right  AIMS (if indicated):     Assets:  Social Support  ADL's:  Impaired  Cognition:  Impaired,  Moderate  Sleep:        Treatment Plan Summary: Daily contact with patient to assess and evaluate symptoms and progress in treatment, Medication management and Plan 57 year old man presented night before last very intoxicated.  Still having withdrawal symptoms.  Does not appear to be acutely delirious but is fidgety agitated and cognitively impaired.  Patient is begging to go home.  His reasons for it do not make any sense.  I tried to talk him through this and point out that the only reason I could imagine for his anxiety about getting discharge was so that he could go drink.  Patient denied it.  At this point I do not think he is safe to make this decision and I am not going to discontinue the IV C.  I did add a couple of doses of Ativan this evening to help to ease him through this period of withdrawal.  I do not think the patient is suicidal but I think his risk of continued drinking is extremely high.  If we can keep him in the hospital even another day it will make some difference in the chance of sobriety.  Case reviewed with nursing.  I will continue to follow up tomorrow.  Disposition: Patient does not meet criteria for psychiatric inpatient admission. Supportive therapy provided about ongoing stressors. Discussed crisis plan, support from social network, calling 911, coming to the Emergency Department, and calling Suicide Hotline.  Alethia Berthold, MD 08/15/2017 5:13 PM

## 2017-08-15 NOTE — Progress Notes (Signed)
SUBJECTIVE: Feeling better today. More alert. Reports back pain is constant.   Vitals:   08/14/17 1742 08/14/17 2106 08/15/17 0345 08/15/17 0730  BP:  (!) 151/92 (!) 153/93 132/90  Pulse: (!) 162 72 94 88  Resp:  (!) 24 18 20   Temp:  99.5 F (37.5 C) 98.6 F (37 C) 99.4 F (37.4 C)  TempSrc:  Oral Oral Oral  SpO2:  97% 100% 98%  Weight:   179 lb 3.2 oz (81.3 kg)   Height:        Intake/Output Summary (Last 24 hours) at 08/15/2017 0844 Last data filed at 08/15/2017 0105 Gross per 24 hour  Intake 1088.92 ml  Output 2032 ml  Net -943.08 ml    LABS: Basic Metabolic Panel: Recent Labs    08/14/17 0634 08/15/17 0426  NA 134* 135  K 3.3* 3.8  CL 99* 102  CO2 23 25  GLUCOSE 103* 132*  BUN 12 10  CREATININE 0.67 0.62  CALCIUM 7.8* 9.0  MG  --  1.9   Liver Function Tests: Recent Labs    08/13/17 1759  AST 64*  ALT 36  ALKPHOS 81  BILITOT 0.9  PROT 6.9  ALBUMIN 4.0   No results for input(s): LIPASE, AMYLASE in the last 72 hours. CBC: Recent Labs    08/13/17 1759 08/14/17 0112 08/14/17 0634  WBC 5.6 5.1 5.6  NEUTROABS 2.2  --   --   HGB 12.4* 11.1* 11.1*  HCT 40.0 35.7* 35.6*  MCV 74.5* 74.4* 74.3*  PLT 166 127* 119*   Cardiac Enzymes: Recent Labs    08/14/17 0112 08/14/17 0634 08/14/17 1230  TROPONINI 0.05* 0.06* 0.05*   BNP: Invalid input(s): POCBNP D-Dimer: No results for input(s): DDIMER in the last 72 hours. Hemoglobin A1C: Recent Labs    08/14/17 0634  HGBA1C 5.8*   Fasting Lipid Panel: No results for input(s): CHOL, HDL, LDLCALC, TRIG, CHOLHDL, LDLDIRECT in the last 72 hours. Thyroid Function Tests: No results for input(s): TSH, T4TOTAL, T3FREE, THYROIDAB in the last 72 hours.  Invalid input(s): FREET3 Anemia Panel: No results for input(s): VITAMINB12, FOLATE, FERRITIN, TIBC, IRON, RETICCTPCT in the last 72 hours.   PHYSICAL EXAM General: Sitting up, alert, eyes open and answering questions.  HEENT:  Normocephalic and  atramatic Neck:  No JVD.  Lungs: Clear bilaterally to auscultation and percussion. Heart: HRRR . 3/6 holosystolic pulmonic area murmur  Abdomen: Bowel sounds are positive, abdomen soft and non-tender  Msk:  Back normal, normal gait. Normal strength and tone for age. Extremities: No clubbing, cyanosis or edema.   Neuro: Alert and oriented X 3. Psych:  Good affect, responds appropriately  TELEMETRY: NSR 88bpm  ASSESSMENT AND PLAN: Atrial fibrillation with RVR: Converted to NSR 88bpm with amiodarone drip. Switch to oral amiodarone dosing 400mg  BID. Not a good candidate for anticoagulation due to history of alcohol abuse and fall risk. Potassium and magnesium are normal. Caution advised when using haloperidol with amiodarone. Discontinue PRN haloperidol if no longer needed for patients agitation.   Principal Problem:   Atrial fibrillation with RVR (HCC) Active Problems:   Type II diabetes mellitus with manifestations (HCC)   Myasthenia gravis (HCC)   HTN (hypertension)   Acute alcoholic intoxication with complication (HCC)   Chronic combined systolic and diastolic CHF (congestive heart failure) (HCC)    Kurt HammanKristin Hamlet Lasecki, NP-C 08/15/2017 8:44 AM Cell: [phone]

## 2017-08-15 NOTE — Progress Notes (Signed)
[employer] - [place] [facility]                                                                                                                                                                                  Patient Demographics   Kurt Horton, is a 57 y.o. male, DOB - December 11, 1960, ONG:[id number]  Admit date - 08/13/2017   Admitting Physician Oralia Manis, MD  Outpatient Primary MD for the patient is [name], MD   LOS - 2  Subjective: Patient's in normal sinus rhythm now continues to be tremulous   Review of Systems:   CONSTITUTIONAL: No documented fever. No fatigue, weakness. No weight gain, no weight loss.  EYES: No blurry or double vision.  ENT: No tinnitus. No postnasal drip. No redness of the oropharynx.  RESPIRATORY: No cough, no wheeze, no hemoptysis. No dyspnea.  CARDIOVASCULAR: No chest pain. No orthopnea. No palpitations. No syncope.  GASTROINTESTINAL: No nausea, no vomiting or diarrhea. No abdominal pain. No melena or hematochezia.  GENITOURINARY: No dysuria or hematuria.  ENDOCRINE: No polyuria or nocturia. No heat or cold intolerance.  HEMATOLOGY: No anemia. No bruising. No bleeding.  INTEGUMENTARY: No rashes. No lesions.  MUSCULOSKELETAL: Positive for back pain NEUROLOGIC: No numbness, tingling, or ataxia. No seizure-type activity.  Tremulous PSYCHIATRIC: No anxiety. No insomnia. No ADD.    Vitals:   Vitals:   08/14/17 1742 08/14/17 2106 08/15/17 0345 08/15/17 0730  BP:  (!) 151/92 (!) 153/93 132/90  Pulse: (!) 162 72 94 88  Resp:  (!) 24 18 20   Temp:  99.5 F (37.5 C) 98.6 F (37 C) 99.4 F (37.4 C)  TempSrc:  Oral Oral Oral  SpO2:  97% 100% 98%  Weight:   81.3 kg (179 lb 3.2 oz)   Height:        Wt Readings from Last 3 Encounters:  08/15/17 81.3 kg (179 lb 3.2 oz)  06/22/17 86.2 kg (190 lb)  06/07/17 86.2 kg (190 lb)     Intake/Output Summary (Last 24 hours) at 08/15/2017 1414 Last data filed at 08/15/2017 1300 Gross  per 24 hour  Intake 476.04 ml  Output 1977 ml  Net -1500.96 ml    Physical Exam:   GENERAL: Pleasant-appearing in no apparent distress.  HEAD, EYES, EARS, NOSE AND THROAT: Atraumatic, normocephalic. Extraocular muscles are intact. Pupils equal and reactive to light. Sclerae anicteric. No conjunctival injection. No oro-pharyngeal erythema.  NECK: Supple. There is no jugular venous distention. No bruits, no lymphadenopathy, no thyromegaly.  HEART: Irregularly irregular,. No murmurs, no rubs, no clicks.  LUNGS: Clear to auscultation bilaterally. No rales or rhonchi. No wheezes.  ABDOMEN: Soft, flat, nontender, nondistended.  Has good bowel sounds. No hepatosplenomegaly appreciated.  EXTREMITIES: No evidence of any cyanosis, clubbing, or peripheral edema.  +2 pedal and radial pulses bilaterally.  NEUROLOGIC: The patient is alert, awake, and oriented x3 with no focal motor or sensory deficits appreciated bilaterally.  SKIN: Moist and warm with no rashes appreciated.  Psych: Not anxious, depressed LN: No inguinal LN enlargement    Antibiotics   Anti-infectives (From admission, onward)   None      Medications   Scheduled Meds: . amiodarone  400 mg Oral BID  . enoxaparin (LOVENOX) injection  40 mg Subcutaneous Q24H  . folic acid  1 mg Oral Daily  . insulin aspart  0-5 Units Subcutaneous QHS  . insulin aspart  0-9 Units Subcutaneous TID WC  . LORazepam  0-4 mg Intravenous Q6H   Followed by  . [START ON 08/16/2017] LORazepam  0-4 mg Intravenous Q12H  . metoprolol succinate  50 mg Oral Daily  . multivitamin with minerals  1 tablet Oral Daily  . mycophenolate  500 mg Oral q morning - 10a   And  . mycophenolate  1,000 mg Oral QPM  . nicotine  21 mg Transdermal Daily  . sodium chloride flush  3 mL Intravenous Q12H  . sodium chloride flush  3 mL Intravenous Q12H  . thiamine  100 mg Oral Daily   Or  . thiamine  100 mg Intravenous Daily   Continuous Infusions:  PRN  Meds:.acetaminophen **OR** acetaminophen, haloperidol **OR** haloperidol lactate, LORazepam **OR** LORazepam, meloxicam, ondansetron **OR** [DISCONTINUED] ondansetron (ZOFRAN) IV, sodium chloride flush, traMADol   Data Review:   Micro Results Recent Results (from the past 240 hour(s))  C difficile quick scan w PCR reflex     Status: None   Collection Time: 08/14/17  5:41 PM  Result Value Ref Range Status   C Diff antigen NEGATIVE NEGATIVE Final   C Diff toxin NEGATIVE NEGATIVE Final   C Diff interpretation No C. difficile detected.  Final    Comment: VALID Performed at [facility], [address] [place], [place] [address]     Radiology Reports [place]g [place]  Result Date: 08/13/2017 CLINICAL DATA:  Back pain. EXAM: PORTABLE CHEST 1 VIEW COMPARISON:  Radiographs of March 19, 2017. FINDINGS: Stable cardiomegaly. No pneumothorax or pleural effusion is noted. Both lungs are clear. The visualized skeletal structures are unremarkable. IMPRESSION: No acute cardiopulmonary abnormality seen. Electronically Signed   By: Lupita RaiderJames  Green Jr, M.D.   On: 08/13/2017 18:18     CBC Recent Labs  Lab 08/13/17 1759 08/14/17 0112 08/14/17 0634  WBC 5.6 5.1 5.6  HGB 12.4* 11.1* 11.1*  HCT 40.0 35.7* 35.6*  PLT 166 127* 119*  MCV 74.5* 74.4* 74.3*  MCH 23.1* 23.1* 23.1*  MCHC 31.0* 31.1* 31.1*  RDW 21.5* 21.6* 21.9*  LYMPHSABS 2.9  --   --   MONOABS 0.4  --   --   EOSABS 0.0  --   --   BASOSABS 0.1  --   --     Chemistries  Recent Labs  Lab 08/13/17 1759 08/14/17 0112 08/14/17 0634 08/15/17 0426  NA 139  --  134* 135  K 3.2*  --  3.3* 3.8  CL 101  --  99* 102  CO2 24  --  23 25  GLUCOSE 123*  --  103* 132*  BUN 18  --  12 10  CREATININE 0.80 0.52* 0.67 0.62  CALCIUM 8.1*  --  7.8* 9.0  MG  --   --   --  1.9  AST 64*  --   --   --   ALT 36  --   --   --   ALKPHOS 81  --   --   --   BILITOT 0.9  --   --   --     ------------------------------------------------------------------------------------------------------------------ estimated creatinine clearance is 103.1 mL/min (by C-G formula based on SCr of 0.62 mg/dL). ------------------------------------------------------------------------------------------------------------------ Recent Labs    08/14/17 0634  HGBA1C 5.8*   ------------------------------------------------------------------------------------------------------------------ No results for input(s): CHOL, HDL, LDLCALC, TRIG, CHOLHDL, LDLDIRECT in the last 72 hours. ------------------------------------------------------------------------------------------------------------------ No results for input(s): TSH, T4TOTAL, T3FREE, THYROIDAB in the last 72 hours.  Invalid input(s): FREET3 ------------------------------------------------------------------------------------------------------------------ No results for input(s): VITAMINB12, FOLATE, FERRITIN, TIBC, IRON, RETICCTPCT in the last 72 hours.  Coagulation profile No results for input(s): INR, PROTIME in the last 168 hours.  No results for input(s): DDIMER in the last 72 hours.  Cardiac Enzymes Recent Labs  Lab 08/14/17 0112 08/14/17 0634 08/14/17 1230  TROPONINI 0.05* 0.06* 0.05*   ------------------------------------------------------------------------------------------------------------------ Invalid input(s): POCBNP    Assessment & Plan  Patient is 57 year old with history of alcohol abuse #1 Atrial fibrillation with RVR (HCC) - Continue amiodarone Start aspirin Due to alcohol abuse and fall risk not a good candidate for anticoagulation  #2 Alcohol abuse   CIWA protocol Patient had involuntarily been committed Psychiatry evaluation  #3  Type II diabetes mellitus with manifestations (HCC) -siding scale insulin with corresponding glucose checks  #4  HTN (hypertension) -blood pressure currently stable  #5   Chronic combined systolic and diastolic CHF (congestive heart failure) (HCC) -continue home meds, currently compensated   #6  Myasthenia gravis (HCC) -continue home meds continue CellCept        Code Status Orders  (From admission, onward)        Start     Ordered   08/14/17 0031  Full code  Continuous     08/14/17 0030    Code Status History    Date Active Date Inactive Code Status Order ID Comments User Context   06/04/2017 0116 06/06/2017 1701 Full Code [id number]  Oralia Manis, MD Inpatient   03/19/2017 1559 03/20/2017 2128 Full Code [id number]  Arnaldo Natal, MD ED   02/25/2017 1421 03/01/2017 1829 Full Code [id number]  Houston Siren, MD Inpatient   11/12/2016 1533 11/14/2016 1425 Full Code [id number]  Marguarite Arbour, MD Inpatient   12/26/2015 1927 12/30/2015 1604 Full Code [id number]  Marguarite Arbour, MD Inpatient           Consults cardiology  DVT Prophylaxis  Lovenox   Lab Results  Component Value Date   PLT 119 (L) 08/14/2017     Time Spent in minutes 35 minutes  Greater than 50% of time spent in care coordination and counseling patient regarding the condition and plan of care.   Auburn Bilberry M.D on 08/15/2017 at 2:14 PM  Between 7am to 6pm - Pager - [phone]  After 6pm go to [email or web address] - Social research officer, government  [employer]   Office  [phone]

## 2017-08-16 LAB — GLUCOSE, CAPILLARY
GLUCOSE-CAPILLARY: 154 mg/dL — AB (ref 65–99)
GLUCOSE-CAPILLARY: 122 mg/dL — AB (ref 65–99)
Glucose-Capillary: 121 mg/dL — ABNORMAL HIGH (ref 65–99)
Glucose-Capillary: 145 mg/dL — ABNORMAL HIGH (ref 65–99)

## 2017-08-16 MED ORDER — LOPERAMIDE HCL 2 MG PO CAPS: 2.0000 mg | ORAL_CAPSULE | Freq: Four times a day (QID) | ORAL | Status: DC | PRN

## 2017-08-16 MED ORDER — LOPERAMIDE HCL 2 MG PO CAPS
4.0000 mg | ORAL_CAPSULE | Freq: Four times a day (QID) | ORAL | Status: DC
  Administered 2017-08-17 (×3): 4 mg via ORAL
  Filled 2017-08-16 (×4): qty 2

## 2017-08-16 NOTE — Consult Note (Signed)
[facility] Consult   Reason for Consult: Follow-up consult for 57 year old man with alcohol abuse and DTs Referring Physician: Posey Pronto Patient Identification: Kurt Horton MRN:  [id number] Principal Diagnosis: Alcohol abuse Diagnosis:   Patient Active Problem List   Diagnosis Date Noted  . Alcohol abuse [F10.10]     Priority: High  . Alcohol withdrawal ([place]) [F10.239] 11/13/2016    Priority: High  . Chronic combined systolic and diastolic CHF (congestive heart failure) ([place]) [I50.42] 08/13/2017  . Alcohol withdrawal delirium ([place]) [F10.231] 02/26/2017  . Atrial fibrillation with RVR ([place]) [I48.91] 02/25/2017  . Acute alcoholic intoxication with complication ([place]) [O84.166]   . Substance induced mood disorder ([place]) [F19.94] 11/13/2016  . SVT (supraventricular tachycardia) ([place]) [I47.1] 11/12/2016  . HTN (hypertension) [I10] 11/12/2016  . EtOH dependence ([place]) [F10.20] 11/12/2016  . Lumbar radiculopathy [M54.16] 08/24/2016  . Depression with anxiety [F41.8] 08/24/2016  . Chronic low back pain [M54.5, G89.29] 01/04/2016  . Pressure ulcer [L89.90] 12/29/2015  . Osteomyelitis ([place]) [M86.9] 12/26/2015  . PVD (peripheral vascular disease) ([place]) [I73.9] 12/26/2015  . Type II diabetes mellitus with manifestations ([place]) [E11.8] 12/26/2015  . Myasthenia gravis ([place]) [G70.00] 12/26/2015    Total Time spent with patient: 30 minutes  Subjective:   Kurt Horton is a 57 y.o. male patient admitted with "I am tired".  HPI: Patient seen chart reviewed.  See previous notes.  Today the patient is different than he was yesterday.  He was sleepy but easy to arouse but when he did awake he was much less anxious.  He was not begging for discharge or rationally.  He was able to have a more lucid conversation about his situation.  Patient denies auditory or visual hallucinations.  Denies suicidal thoughts.  Cooperative currently with treatment.  Past Psychiatric History: Long history of  alcohol abuse multiple hospitalizations with history of DTs.  History of depression primarily in the context of alcohol abuse  Risk to Self: Is patient at risk for suicide?: No Risk to Others:   Prior Inpatient Therapy:   Prior Outpatient Therapy:    Past Medical History:  Past Medical History:  Diagnosis Date  . Alcohol abuse   . Alcohol withdrawal ([place]) 11/12/2016  . Diabetes mellitus without complication ([place])   . DJD (degenerative joint disease)   . Hypertension   . Myasthenia gravis ([place])   . Renal disorder     Past Surgical History:  Procedure Laterality Date  . AMPUTATION TOE Left 12/29/2015   Procedure: AMPUTATION TOE;  Surgeon: Samara Deist, DPM;  Location: [facility] ORS;  Service: Podiatry;  Laterality: Left;  . PERIPHERAL VASCULAR CATHETERIZATION N/A 12/27/2015   Procedure: Lower Extremity Angiography;  Surgeon: Algernon Huxley, MD;  Location: [facility];  Service: Cardiovascular;  Laterality: N/A;   Family History:  Family History  Problem Relation Age of Onset  . Rheum arthritis Father    Family Psychiatric  History: None Social History:  Social History   Substance and Sexual Activity  Alcohol Use Yes   Comment: Pt had tequila and rum, at least a 5th     Social History   Substance and Sexual Activity  Drug Use No    Social History   Socioeconomic History  . Marital status: Legally Separated    Spouse name: Not on file  . Number of children: Not on file  . Years of education: Not on file  . Highest education level: Not on file  Occupational History  . Not on  file  Social Needs  . Financial resource strain: Not on file  . Food insecurity:    Worry: Not on file    Inability: Not on file  . Transportation needs:    Medical: Not on file    Non-medical: Not on file  Tobacco Use  . Smoking status: Current Every Day Smoker    Packs/day: 0.50    Types: Cigarettes  . Smokeless tobacco: Never Used  Substance and Sexual Activity  . Alcohol use: Yes     Comment: Pt had tequila and rum, at least a 5th  . Drug use: No  . Sexual activity: Not on file  Lifestyle  . Physical activity:    Days per week: Not on file    Minutes per session: Not on file  . Stress: Not on file  Relationships  . Social connections:    Talks on phone: Not on file    Gets together: Not on file    Attends religious service: Not on file    Active member of club or organization: Not on file    Attends meetings of clubs or organizations: Not on file    Relationship status: Not on file  Other Topics Concern  . Not on file  Social History Narrative  . Not on file   Additional Social History:    Allergies:   Allergies  Allergen Reactions  . Other Other (See Comments)    Patient states he can't take any "mycin" medications because myasthenia gravis.    Labs:  Results for orders placed or performed during the hospital encounter of 08/13/17 (from the past 48 hour(s))  Glucose, capillary     Status: Abnormal   Collection Time: 08/14/17  9:20 PM  Result Value Ref Range   Glucose-Capillary 105 (H) 65 - 99 mg/dL   Comment 1 Notify RN    Comment 2 Document in Chart   Basic metabolic panel     Status: Abnormal   Collection Time: 08/15/17  4:26 AM  Result Value Ref Range   Sodium 135 135 - 145 mmol/L   Potassium 3.8 3.5 - 5.1 mmol/L   Chloride 102 101 - 111 mmol/L   CO2 25 22 - 32 mmol/L   Glucose, Bld 132 (H) 65 - 99 mg/dL   BUN 10 6 - 20 mg/dL   Creatinine, Ser 0.62 0.61 - 1.24 mg/dL   Calcium 9.0 8.9 - 10.3 mg/dL   GFR calc non Af Amer >60 >60 mL/min   GFR calc Af Amer >60 >60 mL/min    Comment: (NOTE) The eGFR has been calculated using the CKD EPI equation. This calculation has not been validated in all clinical situations. eGFR's persistently <60 mL/min signify possible Chronic Kidney Disease.    Anion gap 8 5 - 15    Comment: Performed at [facility], [address] [place], [place] [address]  Magnesium     Status: None    Collection Time: 08/15/17  4:26 AM  Result Value Ref Range   Magnesium 1.9 1.7 - 2.4 mg/dL    Comment: Performed at [facility], [address] [place], [place] [address]  Glucose, capillary     Status: Abnormal   Collection Time: 08/15/17  7:28 AM  Result Value Ref Range   Glucose-Capillary 126 (H) 65 - 99 mg/dL  Glucose, capillary     Status: Abnormal   Collection Time: 08/15/17 11:46 AM  Result Value Ref Range   Glucose-Capillary 136 (H) 65 - 99  mg/dL  Glucose, capillary     Status: Abnormal   Collection Time: 08/15/17  5:02 PM  Result Value Ref Range   Glucose-Capillary 152 (H) 65 - 99 mg/dL  Glucose, capillary     Status: Abnormal   Collection Time: 08/15/17  8:28 PM  Result Value Ref Range   Glucose-Capillary 112 (H) 65 - 99 mg/dL   Comment 1 Notify RN   Glucose, capillary     Status: Abnormal   Collection Time: 08/16/17  8:27 AM  Result Value Ref Range   Glucose-Capillary 121 (H) 65 - 99 mg/dL  Glucose, capillary     Status: Abnormal   Collection Time: 08/16/17 11:53 AM  Result Value Ref Range   Glucose-Capillary 154 (H) 65 - 99 mg/dL  Glucose, capillary     Status: Abnormal   Collection Time: 08/16/17  5:07 PM  Result Value Ref Range   Glucose-Capillary 122 (H) 65 - 99 mg/dL   Comment 1 Notify RN     Current Facility-Administered Medications  Medication Dose Route Frequency Provider Last Rate Last Dose  . acetaminophen (TYLENOL) tablet 650 mg  650 mg Oral Q6H PRN Lance Coon, MD   650 mg at 08/14/17 0423   Or  . acetaminophen (TYLENOL) suppository 650 mg  650 mg Rectal Q6H PRN Lance Coon, MD      . amiodarone (PACERONE) tablet 400 mg  400 mg Oral BID Jake Bathe, FNP   400 mg at 08/16/17 0848  . aspirin EC tablet 325 mg  325 mg Oral Daily Dustin Flock, MD   325 mg at 08/16/17 0848  . enoxaparin (LOVENOX) injection 40 mg  40 mg Subcutaneous Q24H Lance Coon, MD   40 mg at 08/16/17 0018  . folic acid (FOLVITE) tablet 1 mg  1 mg Oral  Daily Lance Coon, MD   1 mg at 08/16/17 0847  . haloperidol (HALDOL) tablet 2 mg  2 mg Oral Q4H PRN Dustin Flock, MD       Or  . haloperidol lactate (HALDOL) injection 2 mg  2 mg Intramuscular Q4H PRN Dustin Flock, MD      . insulin aspart (novoLOG) injection 0-5 Units  0-5 Units Subcutaneous QHS Lance Coon, MD      . insulin aspart (novoLOG) injection 0-9 Units  0-9 Units Subcutaneous TID WC Lance Coon, MD   2 Units at 08/16/17 1217  . loperamide (IMODIUM) capsule 2 mg  2 mg Oral Q6H PRN Dustin Flock, MD      . LORazepam (ATIVAN) injection 0-4 mg  0-4 mg Intravenous Laurence Spates, MD   2 mg at 08/16/17 0019  . LORazepam (ATIVAN) tablet 1 mg  1 mg Oral Q6H PRN Lance Coon, MD   1 mg at 08/16/17 1721   Or  . LORazepam (ATIVAN) injection 1 mg  1 mg Intravenous Q6H PRN Lance Coon, MD   1 mg at 08/14/17 0915  . meloxicam (MOBIC) tablet 7.5 mg  7.5 mg Oral BID PRN Harrie Foreman, MD   7.5 mg at 08/16/17 0936  . metoprolol succinate (TOPROL-XL) 24 hr tablet 50 mg  50 mg Oral Daily Neoma Laming A, MD   50 mg at 08/16/17 0848  . multivitamin with minerals tablet 1 tablet  1 tablet Oral Daily Lance Coon, MD   1 tablet at 08/16/17 0848  . mycophenolate (CELLCEPT) capsule 500 mg  500 mg Oral q morning - 10a Lance Coon, MD   500 mg at 08/16/17 [phone]  And  . mycophenolate (CELLCEPT) capsule 1,000 mg  1,000 mg Oral QPM Lance Coon, MD   1,000 mg at 08/16/17 1721  . nicotine (NICODERM CQ - dosed in mg/24 hours) patch 21 mg  21 mg Transdermal Daily Harrie Foreman, MD   21 mg at 08/16/17 0849  . ondansetron (ZOFRAN) tablet 4 mg  4 mg Oral Q6H PRN Lance Coon, MD      . sodium chloride flush (NS) 0.9 % injection 3 mL  3 mL Intravenous Q12H Dustin Flock, MD   3 mL at 08/16/17 0850  . sodium chloride flush (NS) 0.9 % injection 3 mL  3 mL Intravenous Q12H Dustin Flock, MD   3 mL at 08/16/17 0851  . sodium chloride flush (NS) 0.9 % injection 3 mL  3 mL Intravenous  PRN Dustin Flock, MD      . thiamine (VITAMIN B-1) tablet 100 mg  100 mg Oral Daily Lance Coon, MD   100 mg at 08/16/17 5176   Or  . thiamine (B-1) injection 100 mg  100 mg Intravenous Daily Lance Coon, MD      . traMADol Veatrice Bourbon) tablet 50 mg  50 mg Oral Q6H PRN Dustin Flock, MD   50 mg at 08/16/17 1721    Musculoskeletal: Strength & Muscle Tone: within normal limits and decreased Gait & Station: ataxic Patient leans: N/A  Psychiatric Specialty Exam: Physical Exam  Nursing note and vitals reviewed. Constitutional: He appears well-developed.  HENT:  Head: Normocephalic and atraumatic.  Eyes: Pupils are equal, round, and reactive to light. Conjunctivae are normal.  Neck: Normal range of motion.  Cardiovascular: Regular rhythm and normal heart sounds.  Respiratory: Effort normal. No respiratory distress.  GI: Soft.  Musculoskeletal: Normal range of motion.  Neurological: He is alert.  Skin: Skin is warm and dry.  Psychiatric: Judgment normal. His affect is blunt. His speech is delayed. He is slowed. Thought content is not paranoid and not delusional. Cognition and memory are impaired. He expresses no homicidal and no suicidal ideation.    Review of Systems  Constitutional: Negative.   HENT: Negative.   Eyes: Negative.   Respiratory: Negative.   Cardiovascular: Negative.   Gastrointestinal: Negative.   Musculoskeletal: Negative.   Skin: Negative.   Neurological: Negative.   Psychiatric/Behavioral: Positive for memory loss and substance abuse. Negative for depression, hallucinations and suicidal ideas. The patient is not nervous/anxious and does not have insomnia.     Blood pressure (!) 147/100, pulse 82, temperature 98.5 F (36.9 C), temperature source Oral, resp. rate 16, height _0  (1.753 m), weight 80.8 kg (178 lb 2.1 oz), SpO2 98 %.Body mass index is 26.31 kg/m.  General Appearance: Disheveled  Eye Contact:  Minimal  Speech:  Slow  Volume:  Decreased   Mood:  Euthymic  Affect:  Constricted  Thought Process:  Goal Directed  Orientation:  Full (Time, Place, and Person)  Thought Content:  Logical  Suicidal Thoughts:  No  Homicidal Thoughts:  No  Memory:  Immediate;   Fair Recent;   Fair Remote;   Fair  Judgement:  Fair  Insight:  Fair  Psychomotor Activity:  Decreased  Concentration:  Concentration: Fair  Recall:  [employer] of Knowledge:  Fair  Language:  Fair  Akathisia:  No  Handed:  Right  AIMS (if indicated):     Assets:  Desire for Improvement Housing Resilience Social Support  ADL's:  Impaired  Cognition:  Impaired,  Mild  Sleep:  Treatment Plan Summary: Plan Mr. Mansel seems to have calm down since yesterday.  He is not begging for discharge.  Shows better insight into his condition.  He has not required any sedating medicine during the day.  He has a clear her more lucid ability to plan for the future.  At this time I think he does not meet commitment criteria anymore.  Supportive counseling completed.  I am going to discontinue the IV C.  No change to medicine.  If he remains in the hospital I will follow up with him tomorrow.  Disposition: No evidence of imminent risk to self or others at present.   Patient does not meet criteria for psychiatric inpatient admission. Supportive therapy provided about ongoing stressors.  Alethia Berthold, MD 08/16/2017 5:41 PM

## 2017-08-16 NOTE — Plan of Care (Signed)
  Problem: Clinical Measurements: Goal: Cardiovascular complication will be avoided Outcome: Progressing   Problem: Coping: Goal: Level of anxiety will decrease Outcome: Progressing   Problem: Safety: Goal: Ability to remain free from injury will improve Outcome: Progressing   Problem: Activity: Goal: Ability to tolerate increased activity will improve Outcome: Progressing

## 2017-08-16 NOTE — Plan of Care (Signed)
1:1 discontinued.

## 2017-08-16 NOTE — Progress Notes (Signed)
SUBJECTIVE: patient is sleeping comfortably without any eventful night   Vitals:   08/15/17 1619 08/15/17 1954 08/16/17 0500 08/16/17 0623  BP: (!) 141/88 (!) 140/91  (!) 147/94  Pulse: 86 80  87  Resp: 18     Temp: 98.3 F (36.8 C) 98.7 F (37.1 C)  99.8 F (37.7 C)  TempSrc: Oral Oral  Axillary  SpO2: 99% 97%  98%  Weight:   178 lb 2.1 oz (80.8 kg)   Height:        Intake/Output Summary (Last 24 hours) at 08/16/2017 0806 Last data filed at 08/16/2017 0630 Gross per 24 hour  Intake 940 ml  Output 1325 ml  Net -385 ml    LABS: Basic Metabolic Panel: Recent Labs    08/14/17 0634 08/15/17 0426  NA 134* 135  K 3.3* 3.8  CL 99* 102  CO2 23 25  GLUCOSE 103* 132*  BUN 12 10  CREATININE 0.67 0.62  CALCIUM 7.8* 9.0  MG  --  1.9   Liver Function Tests: Recent Labs    08/13/17 1759  AST 64*  ALT 36  ALKPHOS 81  BILITOT 0.9  PROT 6.9  ALBUMIN 4.0   No results for input(s): LIPASE, AMYLASE in the last 72 hours. CBC: Recent Labs    08/13/17 1759 08/14/17 0112 08/14/17 0634  WBC 5.6 5.1 5.6  NEUTROABS 2.2  --   --   HGB 12.4* 11.1* 11.1*  HCT 40.0 35.7* 35.6*  MCV 74.5* 74.4* 74.3*  PLT 166 127* 119*   Cardiac Enzymes: Recent Labs    08/14/17 0112 08/14/17 0634 08/14/17 1230  TROPONINI 0.05* 0.06* 0.05*   BNP: Invalid input(s): POCBNP D-Dimer: No results for input(s): DDIMER in the last 72 hours. Hemoglobin A1C: Recent Labs    08/14/17 0634  HGBA1C 5.8*   Fasting Lipid Panel: No results for input(s): CHOL, HDL, LDLCALC, TRIG, CHOLHDL, LDLDIRECT in the last 72 hours. Thyroid Function Tests: No results for input(s): TSH, T4TOTAL, T3FREE, THYROIDAB in the last 72 hours.  Invalid input(s): FREET3 Anemia Panel: No results for input(s): VITAMINB12, FOLATE, FERRITIN, TIBC, IRON, RETICCTPCT in the last 72 hours.   PHYSICAL EXAM General: Well developed, well nourished, in no acute distress HEENT:  Normocephalic and atramatic Neck:  No JVD.   Lungs: Clear bilaterally to auscultation and percussion. Heart: HRRR . Normal S1 and S2 without gallops or murmurs.  Abdomen: Bowel sounds are positive, abdomen soft and non-tender  Msk:  Back normal, normal gait. Normal strength and tone for age. Extremities: No clubbing, cyanosis or edema.   Neuro: Alert and oriented X 3. Psych:  Good affect, responds appropriately  TELEMETRY: sinus rhythm at 85 bpm  ASSESSMENT AND PLAN: status post atrial fibrillation with rapid ventricular response rate right now in normal sinus rhythm and on by mouth amiodarone 400 twice a day. He has history of alcohol abuse and is not a candidate for anticoagulation. Advise changing amiodarone to 400 daily for a few days and then 200 daily.  Principal Problem:   Alcohol abuse Active Problems:   Type II diabetes mellitus with manifestations (HCC)   Myasthenia gravis (HCC)   HTN (hypertension)   Atrial fibrillation with RVR (HCC)   Acute alcoholic intoxication with complication (HCC)   Chronic combined systolic and diastolic CHF (congestive heart failure) (HCC)    Kurt Horton A, MD, [facility] CampusFACC 08/16/2017 8:06 AM

## 2017-08-16 NOTE — Progress Notes (Signed)
[employer] - [place] at [facility]                                                                                                                                                                                  Patient Demographics   Kurt Horton, is a 58 y.o. male, DOB - 07-31-1960, ZOX:[id number]  Admit date - 08/13/2017   Admitting Physician Oralia Manis, MD  Outpatient Primary MD for the patient is [name], MD   LOS - 3  Subjective: Continues to require Ativan   Review of Systems:   CONSTITUTIONAL: No documented fever. No fatigue, weakness. No weight gain, no weight loss.  EYES: No blurry or double vision.  ENT: No tinnitus. No postnasal drip. No redness of the oropharynx.  RESPIRATORY: No cough, no wheeze, no hemoptysis. No dyspnea.  CARDIOVASCULAR: No chest pain. No orthopnea. No palpitations. No syncope.  GASTROINTESTINAL: No nausea, no vomiting or diarrhea. No abdominal pain. No melena or hematochezia.  GENITOURINARY: No dysuria or hematuria.  ENDOCRINE: No polyuria or nocturia. No heat or cold intolerance.  HEMATOLOGY: No anemia. No bruising. No bleeding.  INTEGUMENTARY: No rashes. No lesions.  MUSCULOSKELETAL: Positive for back pain NEUROLOGIC: No numbness, tingling, or ataxia. No seizure-type activity.  Tremulous PSYCHIATRIC: No anxiety. No insomnia. No ADD.    Vitals:   Vitals:   08/16/17 0828 08/16/17 1216 08/16/17 1707 08/16/17 1724  BP: (!) 145/96 138/87 (!) 148/104 (!) 147/100  Pulse: 88 80 83 82  Resp: 16  16   Temp: 98.3 F (36.8 C)  98.5 F (36.9 C)   TempSrc: Oral  Oral   SpO2: 98%  96% 98%  Weight:      Height:        Wt Readings from Last 3 Encounters:  08/16/17 80.8 kg (178 lb 2.1 oz)  06/22/17 86.2 kg (190 lb)  06/07/17 86.2 kg (190 lb)     Intake/Output Summary (Last 24 hours) at 08/16/2017 1758 Last data filed at 08/16/2017 1030 Gross per 24 hour  Intake 440 ml  Output 725 ml  Net -285 ml    Physical Exam:    GENERAL: Pleasant-appearing in no apparent distress.  HEAD, EYES, EARS, NOSE AND THROAT: Atraumatic, normocephalic. Extraocular muscles are intact. Pupils equal and reactive to light. Sclerae anicteric. No conjunctival injection. No oro-pharyngeal erythema.  NECK: Supple. There is no jugular venous distention. No bruits, no lymphadenopathy, no thyromegaly.  HEART: Irregularly irregular,. No murmurs, no rubs, no clicks.  LUNGS: Clear to auscultation bilaterally. No rales or rhonchi. No wheezes.  ABDOMEN: Soft, flat, nontender, nondistended. Has good bowel sounds. No hepatosplenomegaly appreciated.  EXTREMITIES: No evidence of any cyanosis, clubbing,  or peripheral edema.  +2 pedal and radial pulses bilaterally.  NEUROLOGIC: The patient is alert, awake, and oriented x3 with no focal motor or sensory deficits appreciated bilaterally.  SKIN: Moist and warm with no rashes appreciated.  Psych: Not anxious, depressed LN: No inguinal LN enlargement    Antibiotics   Anti-infectives (From admission, onward)   None      Medications   Scheduled Meds: . amiodarone  400 mg Oral BID  . aspirin EC  325 mg Oral Daily  . enoxaparin (LOVENOX) injection  40 mg Subcutaneous Q24H  . folic acid  1 mg Oral Daily  . insulin aspart  0-5 Units Subcutaneous QHS  . insulin aspart  0-9 Units Subcutaneous TID WC  . LORazepam  0-4 mg Intravenous Q12H  . metoprolol succinate  50 mg Oral Daily  . multivitamin with minerals  1 tablet Oral Daily  . mycophenolate  500 mg Oral q morning - 10a   And  . mycophenolate  1,000 mg Oral QPM  . nicotine  21 mg Transdermal Daily  . sodium chloride flush  3 mL Intravenous Q12H  . sodium chloride flush  3 mL Intravenous Q12H  . thiamine  100 mg Oral Daily   Or  . thiamine  100 mg Intravenous Daily   Continuous Infusions:  PRN Meds:.acetaminophen **OR** acetaminophen, haloperidol **OR** haloperidol lactate, loperamide, LORazepam **OR** LORazepam, meloxicam, ondansetron  **OR** [DISCONTINUED] ondansetron (ZOFRAN) IV, sodium chloride flush, traMADol   Data Review:   Micro Results Recent Results (from the past 240 hour(s))  C difficile quick scan w PCR reflex     Status: None   Collection Time: 08/14/17  5:41 PM  Result Value Ref Range Status   C Diff antigen NEGATIVE NEGATIVE Final   C Diff toxin NEGATIVE NEGATIVE Final   C Diff interpretation No C. difficile detected.  Final    Comment: VALID Performed at [facility], [address] [place], [place] [address]     Radiology Reports Dg Chest [place] 1 View  Result Date: 08/13/2017 CLINICAL DATA:  Back pain. EXAM: PORTABLE CHEST 1 VIEW COMPARISON:  Radiographs of March 19, 2017. FINDINGS: Stable cardiomegaly. No pneumothorax or pleural effusion is noted. Both lungs are clear. The visualized skeletal structures are unremarkable. IMPRESSION: No acute cardiopulmonary abnormality seen. Electronically Signed   By: Lupita Raider, M.D.   On: 08/13/2017 18:18     CBC Recent Labs  Lab 08/13/17 1759 08/14/17 0112 08/14/17 0634  WBC 5.6 5.1 5.6  HGB 12.4* 11.1* 11.1*  HCT 40.0 35.7* 35.6*  PLT 166 127* 119*  MCV 74.5* 74.4* 74.3*  MCH 23.1* 23.1* 23.1*  MCHC 31.0* 31.1* 31.1*  RDW 21.5* 21.6* 21.9*  LYMPHSABS 2.9  --   --   MONOABS 0.4  --   --   EOSABS 0.0  --   --   BASOSABS 0.1  --   --     Chemistries  Recent Labs  Lab 08/13/17 1759 08/14/17 0112 08/14/17 0634 08/15/17 0426  NA 139  --  134* 135  K 3.2*  --  3.3* 3.8  CL 101  --  99* 102  CO2 24  --  23 25  GLUCOSE 123*  --  103* 132*  BUN 18  --  12 10  CREATININE 0.80 0.52* 0.67 0.62  CALCIUM 8.1*  --  7.8* 9.0  MG  --   --   --  1.9  AST 64*  --   --   --  ALT 36  --   --   --   ALKPHOS 81  --   --   --   BILITOT 0.9  --   --   --    ------------------------------------------------------------------------------------------------------------------ estimated creatinine clearance is 103.1 mL/min (by C-G formula  based on SCr of 0.62 mg/dL). ------------------------------------------------------------------------------------------------------------------ Recent Labs    08/14/17 0634  HGBA1C 5.8*   ------------------------------------------------------------------------------------------------------------------ No results for input(s): CHOL, HDL, LDLCALC, TRIG, CHOLHDL, LDLDIRECT in the last 72 hours. ------------------------------------------------------------------------------------------------------------------ No results for input(s): TSH, T4TOTAL, T3FREE, THYROIDAB in the last 72 hours.  Invalid input(s): FREET3 ------------------------------------------------------------------------------------------------------------------ No results for input(s): VITAMINB12, FOLATE, FERRITIN, TIBC, IRON, RETICCTPCT in the last 72 hours.  Coagulation profile No results for input(s): INR, PROTIME in the last 168 hours.  No results for input(s): DDIMER in the last 72 hours.  Cardiac Enzymes Recent Labs  Lab 08/14/17 0112 08/14/17 0634 08/14/17 1230  TROPONINI 0.05* 0.06* 0.05*   ------------------------------------------------------------------------------------------------------------------ Invalid input(s): POCBNP    Assessment & Plan  Patient is 57 year old with history of alcohol abuse #1 Atrial fibrillation with RVR (HCC) - Continue amiodarone Continue aspirin Due to alcohol abuse and fall risk not a good candidate for anticoagulation  #2 Alcohol abuse   CIWA protocol Appreciate psych input involuntary commitments discontinued   #3  Type II diabetes mellitus with manifestations (HCC) -siding scale insulin with corresponding glucose checks  #4  HTN (hypertension) -blood pressure currently stable  #5  Chronic combined systolic and diastolic CHF (congestive heart failure) (HCC) -continue home meds, currently compensated   #6  Myasthenia gravis (HCC) -continue home meds continue  CellCept        Code Status Orders  (From admission, onward)        Start     Ordered   08/14/17 0031  Full code  Continuous     08/14/17 0030    Code Status History    Date Active Date Inactive Code Status Order ID Comments User Context   06/04/2017 0116 06/06/2017 1701 Full Code [id number]  Oralia ManisWillis, David, MD Inpatient   03/19/2017 1559 03/20/2017 2128 Full Code [id number]  Arnaldo NatalMalinda, Paul F, MD ED   02/25/2017 1421 03/01/2017 1829 Full Code [id number]  Houston SirenSainani, Vivek J, MD Inpatient   11/12/2016 1533 11/14/2016 1425 Full Code [id number]  Marguarite ArbourSparks, Jeffrey D, MD Inpatient   12/26/2015 1927 12/30/2015 1604 Full Code [id number]  Marguarite ArbourSparks, Jeffrey D, MD Inpatient           Consults cardiology  DVT Prophylaxis  Lovenox   Lab Results  Component Value Date   PLT 119 (L) 08/14/2017     Time Spent in minutes 35 minutes  Greater than 50% of time spent in care coordination and counseling patient regarding the condition and plan of care.   Auburn BilberryShreyang Dmauri Rosenow M.D on 08/16/2017 at 5:58 PM  Between 7am to 6pm - Pager - [phone]  After 6pm go to [email or web address] - Social research officer, governmentpassword [email or web address]  [employer]   Office  [phone]

## 2017-08-17 ENCOUNTER — Inpatient Hospital Stay: Payer: Medicaid Other

## 2017-08-17 LAB — CBC WITH DIFFERENTIAL/PLATELET
Basophils Absolute: 0.1 10*3/uL (ref 0–0.1)
Basophils Relative: 1 %
EOS ABS: 0.1 10*3/uL (ref 0–0.7)
EOS PCT: 2 %
HCT: 38.9 % — ABNORMAL LOW (ref 40.0–52.0)
Hemoglobin: 12.4 g/dL — ABNORMAL LOW (ref 13.0–18.0)
Lymphocytes Relative: 28 %
Lymphs Abs: 1.5 10*3/uL (ref 1.0–3.6)
MCH: 24.6 pg — ABNORMAL LOW (ref 26.0–34.0)
MCHC: 31.8 g/dL — AB (ref 32.0–36.0)
MCV: 77.6 fL — ABNORMAL LOW (ref 80.0–100.0)
MONO ABS: 0.6 10*3/uL (ref 0.2–1.0)
MONOS PCT: 11 %
NEUTROS PCT: 58 %
Neutro Abs: 3.2 10*3/uL (ref 1.4–6.5)
PLATELETS: 154 10*3/uL (ref 150–440)
RBC: 5.01 MIL/uL (ref 4.40–5.90)
RDW: 23.9 % — AB (ref 11.5–14.5)
WBC: 5.5 10*3/uL (ref 3.8–10.6)

## 2017-08-17 LAB — GLUCOSE, CAPILLARY
GLUCOSE-CAPILLARY: 107 mg/dL — AB (ref 65–99)
GLUCOSE-CAPILLARY: 111 mg/dL — AB (ref 65–99)
GLUCOSE-CAPILLARY: 155 mg/dL — AB (ref 65–99)
Glucose-Capillary: 139 mg/dL — ABNORMAL HIGH (ref 65–99)

## 2017-08-17 MED ORDER — METOPROLOL SUCCINATE ER 25 MG PO TB24: 25.0000 mg | ORAL_TABLET | Freq: Every day | ORAL | Status: DC

## 2017-08-17 MED ORDER — AMIODARONE HCL 400 MG PO TABS: 400.0000 mg | ORAL_TABLET | Freq: Two times a day (BID) | ORAL | 0 refills | Status: DC

## 2017-08-17 MED ORDER — AMIODARONE HCL 200 MG PO TABS: 200.0000 mg | ORAL_TABLET | Freq: Every day | ORAL | 0 refills | Status: DC

## 2017-08-17 MED ORDER — SODIUM CHLORIDE 0.9 % IV BOLUS
500.0000 mL | Freq: Once | INTRAVENOUS | Status: AC
  Administered 2017-08-17: 500 mL via INTRAVENOUS

## 2017-08-17 MED ORDER — ONE-A-DAY MENS PO TABS: 1.0000 | ORAL_TABLET | Freq: Every day | ORAL | 0 refills | Status: DC

## 2017-08-17 MED ORDER — CHLORDIAZEPOXIDE HCL 5 MG PO CAPS: 5.0000 mg | ORAL_CAPSULE | Freq: Three times a day (TID) | ORAL | 0 refills | Status: DC | PRN

## 2017-08-17 MED ORDER — CHLORDIAZEPOXIDE HCL 10 MG PO CAPS
10.0000 mg | ORAL_CAPSULE | Freq: Three times a day (TID) | ORAL | Status: DC | PRN
  Administered 2017-08-17 – 2017-08-18 (×2): 10 mg via ORAL
  Filled 2017-08-17 (×2): qty 1

## 2017-08-17 MED ORDER — METOPROLOL SUCCINATE ER 50 MG PO TB24: 50.0000 mg | ORAL_TABLET | Freq: Every day | ORAL | 0 refills | Status: DC

## 2017-08-17 MED ORDER — SODIUM CHLORIDE 0.9 % IV SOLN
INTRAVENOUS | Status: DC
  Administered 2017-08-17 – 2017-08-18 (×2): via INTRAVENOUS

## 2017-08-17 MED ORDER — ASPIRIN 325 MG PO TBEC: 325.0000 mg | DELAYED_RELEASE_TABLET | Freq: Every day | ORAL | 0 refills | Status: DC

## 2017-08-17 NOTE — Plan of Care (Signed)
  Problem: Clinical Measurements: Goal: Ability to maintain clinical measurements within normal limits will improve Outcome: Progressing Goal: Cardiovascular complication will be avoided Outcome: Progressing   Problem: Safety: Goal: Ability to remain free from injury will improve Outcome: Progressing   Problem: Education: Goal: Knowledge of disease or condition will improve Outcome: Progressing Goal: Understanding of medication regimen will improve Outcome: Progressing

## 2017-08-17 NOTE — Progress Notes (Signed)
SUBJECTIVE: feeling much better   Vitals:   08/17/17 1236 08/17/17 1316 08/17/17 1325 08/17/17 1328  BP: 105/84 (!) 89/63 (!) 91/58 (!) 55/33  Pulse: 75 76 68 70  Resp:      Temp:      TempSrc:      SpO2:   94%   Weight:      Height:        Intake/Output Summary (Last 24 hours) at 08/17/2017 1405 Last data filed at 08/17/2017 1024 Gross per 24 hour  Intake 600 ml  Output 350 ml  Net 250 ml    LABS: Basic Metabolic Panel: Recent Labs    08/15/17 0426  NA 135  K 3.8  CL 102  CO2 25  GLUCOSE 132*  BUN 10  CREATININE 0.62  CALCIUM 9.0  MG 1.9   Liver Function Tests: No results for input(s): AST, ALT, ALKPHOS, BILITOT, PROT, ALBUMIN in the last 72 hours. No results for input(s): LIPASE, AMYLASE in the last 72 hours. CBC: No results for input(s): WBC, NEUTROABS, HGB, HCT, MCV, PLT in the last 72 hours. Cardiac Enzymes: No results for input(s): CKTOTAL, CKMB, CKMBINDEX, TROPONINI in the last 72 hours. BNP: Invalid input(s): POCBNP D-Dimer: No results for input(s): DDIMER in the last 72 hours. Hemoglobin A1C: No results for input(s): HGBA1C in the last 72 hours. Fasting Lipid Panel: No results for input(s): CHOL, HDL, LDLCALC, TRIG, CHOLHDL, LDLDIRECT in the last 72 hours. Thyroid Function Tests: No results for input(s): TSH, T4TOTAL, T3FREE, THYROIDAB in the last 72 hours.  Invalid input(s): FREET3 Anemia Panel: No results for input(s): VITAMINB12, FOLATE, FERRITIN, TIBC, IRON, RETICCTPCT in the last 72 hours.   PHYSICAL EXAM General: Well developed, well nourished, in no acute distress HEENT:  Normocephalic and atramatic Neck:  No JVD.  Lungs: Clear bilaterally to auscultation and percussion. Heart: HRRR . Normal S1 and S2 without gallops or murmurs.  Abdomen: Bowel sounds are positive, abdomen soft and non-tender  Msk:  Back normal, normal gait. Normal strength and tone for age. Extremities: No clubbing, cyanosis or edema.   Neuro: Alert and oriented X  3. Psych:  Good affect, responds appropriately  TELEMETRY:NSR  ASSESSMENT AND PLAN: S/P Atrial fibrillation, change amiodrone to 200mg  daily and f/u next week in office.  Principal Problem:   Alcohol abuse Active Problems:   Type II diabetes mellitus with manifestations (HCC)   Myasthenia gravis (HCC)   HTN (hypertension)   Atrial fibrillation with RVR (HCC)   Acute alcoholic intoxication with complication (HCC)   Chronic combined systolic and diastolic CHF (congestive heart failure) (HCC)    Kurt Horton A, MD, [facility] Franklin Square Medical CenterFACC 08/17/2017 2:05 PM

## 2017-08-17 NOTE — Progress Notes (Signed)
Pt requesting another nicotine patch. Stated that he lost the other one. Looked on left shoulder where last placed, searched for the patch, unable to find. Stated to Pt that in the morning the day nurse would place another one and asked if he would be ok. Pt stated "yes". Will continue to monitor and assess.

## 2017-08-17 NOTE — Progress Notes (Signed)
[employer] - [place] at [facility]                                                                                                                                                                                  Patient Demographics   Kurt Horton, is a 57 y.o. male, DOB - 02/10/1961, ZOX:[id number]  Admit date - 08/13/2017   Admitting Physician Oralia Manisavid Willis, MD  Outpatient Primary MD for the patient is Barbette ReichmannHande, Vishwanath, MD   LOS - 4  Subjective: Plan was for patient to be discharged however he was ambulated he was ambulated blood pressure dropped into the 50s he feels very weak and deconditioned   Review of Systems:   CONSTITUTIONAL: No documented fever. No fatigue, positive weakness. No weight gain, no weight loss.  EYES: No blurry or double vision.  ENT: No tinnitus. No postnasal drip. No redness of the oropharynx.  RESPIRATORY: No cough, no wheeze, no hemoptysis. No dyspnea.  CARDIOVASCULAR: No chest pain. No orthopnea. No palpitations. No syncope.  GASTROINTESTINAL: No nausea, no vomiting or diarrhea. No abdominal pain. No melena or hematochezia.  GENITOURINARY: No dysuria or hematuria.  ENDOCRINE: No polyuria or nocturia. No heat or cold intolerance.  HEMATOLOGY: No anemia. No bruising. No bleeding.  INTEGUMENTARY: No rashes. No lesions.  MUSCULOSKELETAL: Positive for back pain NEUROLOGIC: No numbness, tingling, or ataxia. No seizure-type activity.  Tremulous PSYCHIATRIC: No anxiety. No insomnia. No ADD.    Vitals:   Vitals:   08/17/17 1325 08/17/17 1328 08/17/17 1436 08/17/17 1623  BP: (!) 91/58 (!) 55/33 121/87 (!) 89/71  Pulse: 68 70 73 75  Resp:    19  Temp:    (!) 97.5 F (36.4 C)  TempSrc:    Oral  SpO2: 94%   100%  Weight:      Height:        Wt Readings from Last 3 Encounters:  08/17/17 79.3 kg (174 lb 13.2 oz)  06/22/17 86.2 kg (190 lb)  06/07/17 86.2 kg (190 lb)     Intake/Output Summary (Last 24 hours) at 08/17/2017 1649 Last data  filed at 08/17/2017 1500 Gross per 24 hour  Intake 1100 ml  Output 350 ml  Net 750 ml    Physical Exam:   GENERAL: Pleasant-appearing in no apparent distress.  HEAD, EYES, EARS, NOSE AND THROAT: Atraumatic, normocephalic. Extraocular muscles are intact. Pupils equal and reactive to light. Sclerae anicteric. No conjunctival injection. No oro-pharyngeal erythema.  NECK: Supple. There is no jugular venous distention. No bruits, no lymphadenopathy, no thyromegaly.  HEART: Irregularly irregular,. No murmurs, no rubs, no clicks.  LUNGS: Clear to auscultation bilaterally. No rales or rhonchi. No wheezes.  ABDOMEN: Soft, flat, nontender, nondistended. Has good bowel sounds. No hepatosplenomegaly appreciated.  EXTREMITIES: No evidence of any cyanosis, clubbing, or peripheral edema.  +2 pedal and radial pulses bilaterally.  NEUROLOGIC: The patient is alert, awake, and oriented x3 with no focal motor or sensory deficits appreciated bilaterally.  SKIN: Moist and warm with no rashes appreciated.  Psych: Not anxious, depressed LN: No inguinal LN enlargement    Antibiotics   Anti-infectives (From admission, onward)   None      Medications   Scheduled Meds: . amiodarone  400 mg Oral BID  . aspirin EC  325 mg Oral Daily  . enoxaparin (LOVENOX) injection  40 mg Subcutaneous Q24H  . folic acid  1 mg Oral Daily  . insulin aspart  0-5 Units Subcutaneous QHS  . insulin aspart  0-9 Units Subcutaneous TID WC  . loperamide  4 mg Oral Q6H  . LORazepam  0-4 mg Intravenous Q12H  . multivitamin with minerals  1 tablet Oral Daily  . mycophenolate  500 mg Oral q morning - 10a   And  . mycophenolate  1,000 mg Oral QPM  . nicotine  21 mg Transdermal Daily  . sodium chloride flush  3 mL Intravenous Q12H  . sodium chloride flush  3 mL Intravenous Q12H  . thiamine  100 mg Oral Daily   Or  . thiamine  100 mg Intravenous Daily   Continuous Infusions: . sodium chloride     PRN Meds:.acetaminophen  **OR** acetaminophen, chlordiazePOXIDE, meloxicam, ondansetron **OR** [DISCONTINUED] ondansetron (ZOFRAN) IV, sodium chloride flush, traMADol   Data Review:   Micro Results Recent Results (from the past 240 hour(s))  C difficile quick scan w PCR reflex     Status: None   Collection Time: 08/14/17  5:41 PM  Result Value Ref Range Status   C Diff antigen NEGATIVE NEGATIVE Final   C Diff toxin NEGATIVE NEGATIVE Final   C Diff interpretation No C. difficile detected.  Final    Comment: VALID Performed at [facility], [address], [place], [place] [address]     Radiology Reports Dg Chest [place] 1 View  Result Date: 08/13/2017 CLINICAL DATA:  Back pain. EXAM: PORTABLE CHEST 1 VIEW COMPARISON:  Radiographs of March 19, 2017. FINDINGS: Stable cardiomegaly. No pneumothorax or pleural effusion is noted. Both lungs are clear. The visualized skeletal structures are unremarkable. IMPRESSION: No acute cardiopulmonary abnormality seen. Electronically Signed   By: Lupita Raider, M.D.   On: 08/13/2017 18:18     CBC Recent Labs  Lab 08/13/17 1759 08/14/17 0112 08/14/17 0634  WBC 5.6 5.1 5.6  HGB 12.4* 11.1* 11.1*  HCT 40.0 35.7* 35.6*  PLT 166 127* 119*  MCV 74.5* 74.4* 74.3*  MCH 23.1* 23.1* 23.1*  MCHC 31.0* 31.1* 31.1*  RDW 21.5* 21.6* 21.9*  LYMPHSABS 2.9  --   --   MONOABS 0.4  --   --   EOSABS 0.0  --   --   BASOSABS 0.1  --   --     Chemistries  Recent Labs  Lab 08/13/17 1759 08/14/17 0112 08/14/17 0634 08/15/17 0426  NA 139  --  134* 135  K 3.2*  --  3.3* 3.8  CL 101  --  99* 102  CO2 24  --  23 25  GLUCOSE 123*  --  103* 132*  BUN 18  --  12 10  CREATININE 0.80 0.52* 0.67 0.62  CALCIUM 8.1*  --  7.8* 9.0  MG  --   --   --  1.9  AST 64*  --   --   --   ALT 36  --   --   --   ALKPHOS 81  --   --   --   BILITOT 0.9  --   --   --     ------------------------------------------------------------------------------------------------------------------ estimated creatinine clearance is 103.1 mL/min (by C-G formula based on SCr of 0.62 mg/dL). ------------------------------------------------------------------------------------------------------------------ No results for input(s): HGBA1C in the last 72 hours. ------------------------------------------------------------------------------------------------------------------ No results for input(s): CHOL, HDL, LDLCALC, TRIG, CHOLHDL, LDLDIRECT in the last 72 hours. ------------------------------------------------------------------------------------------------------------------ No results for input(s): TSH, T4TOTAL, T3FREE, THYROIDAB in the last 72 hours.  Invalid input(s): FREET3 ------------------------------------------------------------------------------------------------------------------ No results for input(s): VITAMINB12, FOLATE, FERRITIN, TIBC, IRON, RETICCTPCT in the last 72 hours.  Coagulation profile No results for input(s): INR, PROTIME in the last 168 hours.  No results for input(s): DDIMER in the last 72 hours.  Cardiac Enzymes Recent Labs  Lab 08/14/17 0112 08/14/17 0634 08/14/17 1230  TROPONINI 0.05* 0.06* 0.05*   ------------------------------------------------------------------------------------------------------------------ Invalid input(s): POCBNP    Assessment & Plan  Patient is 57 year old with history of alcohol abuse #1 Atrial fibrillation with RVR (HCC) -now in normal sinus rhythm Continue amiodarone orally as per cardiology Continue aspirin Due to alcohol abuse and fall risk not a good candidate for anticoagulation  #2  Hypotension I have given patient IV fluid bolus We will start him on normal saline Discontinue metoprolol  #3 facial droop CT scan of the head will be ordered  #4 alcohol abuse   CIWA protocol Appreciate psych  input involuntary commitments discontinued   #5  Type II diabetes mellitus with manifestations (HCC) -siding scale insulin with corresponding glucose checks  #6HTN (hypertension) -blood pressure currently stable  #7 Chronic combined systolic and diastolic CHF (congestive heart failure) (HCC) -continue home meds, currently compensated   #8  Myasthenia gravis (HCC) -continue home meds continue CellCept        Code Status Orders  (From admission, onward)        Start     Ordered   08/14/17 0031  Full code  Continuous     08/14/17 0030    Code Status History    Date Active Date Inactive Code Status Order ID Comments User Context   06/04/2017 0116 06/06/2017 1701 Full Code [id number]  Oralia Manis, MD Inpatient   03/19/2017 1559 03/20/2017 2128 Full Code [id number]  Arnaldo Natal, MD ED   02/25/2017 1421 03/01/2017 1829 Full Code [id number]  Houston Siren, MD Inpatient   11/12/2016 1533 11/14/2016 1425 Full Code [id number]  Marguarite Arbour, MD Inpatient   12/26/2015 1927 12/30/2015 1604 Full Code [id number]  Marguarite Arbour, MD Inpatient           Consults cardiology  DVT Prophylaxis  Lovenox   Lab Results  Component Value Date   PLT 119 (L) 08/14/2017     Time Spent in minutes 35 minutes  Greater than 50% of time spent in care coordination and counseling patient regarding the condition and plan of care.   Auburn Bilberry M.D on 08/17/2017 at 4:49 PM  Between 7am to 6pm - Pager - [phone]  After 6pm go to [email or web address] - Social research officer, government  [employer]   Office  [phone]

## 2017-08-17 NOTE — Progress Notes (Addendum)
Pt. Ambulated halfway around nursing station with walker, began to feel tired and dizzy after short distance and had to sit down and be wheelchaired back to room. Upon return to room, pt blood pressure, sitting 89/63, pt BP standing dropped significantly to 55/33, pt felt very dizzy, and weak, returned to bed, and BP 91/58. MD notified, and presented at bedside, orders to give 500 ml bolus and continue to monitor patient.

## 2017-08-17 NOTE — Consult Note (Signed)
[facility] Face-to-Face Psychiatry Consult   Reason for Consult: Follow-up note for 57 year old man with alcohol abuse.  See previous notes. Referring Physician: Allena Katz Patient Identification: Kurt Horton MRN:  [id number] Principal Diagnosis: Alcohol abuse Diagnosis:   Patient Active Problem List   Diagnosis Date Noted  . Alcohol abuse [F10.10]     Priority: High  . Alcohol withdrawal (HCC) [F10.239] 11/13/2016    Priority: High  . Chronic combined systolic and diastolic CHF (congestive heart failure) (HCC) [I50.42] 08/13/2017  . Alcohol withdrawal delirium (HCC) [F10.231] 02/26/2017  . Atrial fibrillation with RVR (HCC) [I48.91] 02/25/2017  . Acute alcoholic intoxication with complication (HCC) [F10.929]   . Substance induced mood disorder (HCC) [F19.94] 11/13/2016  . SVT (supraventricular tachycardia) (HCC) [I47.1] 11/12/2016  . HTN (hypertension) [I10] 11/12/2016  . EtOH dependence (HCC) [F10.20] 11/12/2016  . Lumbar radiculopathy [M54.16] 08/24/2016  . Depression with anxiety [F41.8] 08/24/2016  . Chronic low back pain [M54.5, G89.29] 01/04/2016  . Pressure ulcer [L89.90] 12/29/2015  . Osteomyelitis (HCC) [M86.9] 12/26/2015  . PVD (peripheral vascular disease) (HCC) [I73.9] 12/26/2015  . Type II diabetes mellitus with manifestations (HCC) [E11.8] 12/26/2015  . Myasthenia gravis (HCC) [G70.00] 12/26/2015    Total Time spent with patient: 30 minutes  Subjective:   Kurt Horton is a 57 y.o. male patient admitted with "I am doing okay today but I am sleepy".  HPI: Patient interviewed.  See previous notes.  This is a 57 year old man with alcohol abuse who came into the hospital intoxicated and has gone through alcohol withdrawal.  The last couple days his behavior has been more lucid and appropriate.  Patient today was asleep but easily arousable.  Says he is feeling a little bit tired but he is alert and oriented.  Affect calm.  Denies feeling depressed.  Denies suicidal thoughts.   Does not appear to be hallucinating or delusional.  Past Psychiatric History: Long-standing alcohol abuse problems with alcohol withdrawal complications Risk to Self: Is patient at risk for suicide?: No Risk to Others:   Prior Inpatient Therapy:   Prior Outpatient Therapy:    Past Medical History:  Past Medical History:  Diagnosis Date  . Alcohol abuse   . Alcohol withdrawal (HCC) 11/12/2016  . Diabetes mellitus without complication (HCC)   . DJD (degenerative joint disease)   . Hypertension   . Myasthenia gravis (HCC)   . Renal disorder     Past Surgical History:  Procedure Laterality Date  . AMPUTATION TOE Left 12/29/2015   Procedure: AMPUTATION TOE;  Surgeon: Gwyneth Revels, DPM;  Location: [facility] ORS;  Service: Podiatry;  Laterality: Left;  . PERIPHERAL VASCULAR CATHETERIZATION N/A 12/27/2015   Procedure: Lower Extremity Angiography;  Surgeon: Annice Needy, MD;  Location: [facility] INVASIVE CV LAB;  Service: Cardiovascular;  Laterality: N/A;   Family History:  Family History  Problem Relation Age of Onset  . Rheum arthritis Father    Family Psychiatric  History: None Social History:  Social History   Substance and Sexual Activity  Alcohol Use Yes   Comment: Pt had tequila and rum, at least a 5th     Social History   Substance and Sexual Activity  Drug Use No    Social History   Socioeconomic History  . Marital status: Legally Separated    Spouse name: Not on file  . Number of children: Not on file  . Years of education: Not on file  . Highest education level: Not on file  Occupational History  .  Not on file  Social Needs  . Financial resource strain: Not on file  . Food insecurity:    Worry: Not on file    Inability: Not on file  . Transportation needs:    Medical: Not on file    Non-medical: Not on file  Tobacco Use  . Smoking status: Current Every Day Smoker    Packs/day: 0.50    Types: Cigarettes  . Smokeless tobacco: Never Used  Substance and Sexual  Activity  . Alcohol use: Yes    Comment: Pt had tequila and rum, at least a 5th  . Drug use: No  . Sexual activity: Not on file  Lifestyle  . Physical activity:    Days per week: Not on file    Minutes per session: Not on file  . Stress: Not on file  Relationships  . Social connections:    Talks on phone: Not on file    Gets together: Not on file    Attends religious service: Not on file    Active member of club or organization: Not on file    Attends meetings of clubs or organizations: Not on file    Relationship status: Not on file  Other Topics Concern  . Not on file  Social History Narrative  . Not on file   Additional Social History:    Allergies:   Allergies  Allergen Reactions  . Other Other (See Comments)    Patient states he can't take any "mycin" medications because myasthenia gravis.    Labs:  Results for orders placed or performed during the hospital encounter of 08/13/17 (from the past 48 hour(s))  Glucose, capillary     Status: Abnormal   Collection Time: 08/15/17  8:28 PM  Result Value Ref Range   Glucose-Capillary 112 (H) 65 - 99 mg/dL   Comment 1 Notify RN   Glucose, capillary     Status: Abnormal   Collection Time: 08/16/17  8:27 AM  Result Value Ref Range   Glucose-Capillary 121 (H) 65 - 99 mg/dL  Glucose, capillary     Status: Abnormal   Collection Time: 08/16/17 11:53 AM  Result Value Ref Range   Glucose-Capillary 154 (H) 65 - 99 mg/dL  Glucose, capillary     Status: Abnormal   Collection Time: 08/16/17  5:07 PM  Result Value Ref Range   Glucose-Capillary 122 (H) 65 - 99 mg/dL   Comment 1 Notify RN   Glucose, capillary     Status: Abnormal   Collection Time: 08/16/17  9:18 PM  Result Value Ref Range   Glucose-Capillary 145 (H) 65 - 99 mg/dL   Comment 1 Notify RN    Comment 2 Document in Chart   Glucose, capillary     Status: Abnormal   Collection Time: 08/17/17  8:06 AM  Result Value Ref Range   Glucose-Capillary 139 (H) 65 - 99 mg/dL    Comment 1 Notify RN    Comment 2 Document in Chart   Glucose, capillary     Status: Abnormal   Collection Time: 08/17/17 11:59 AM  Result Value Ref Range   Glucose-Capillary 107 (H) 65 - 99 mg/dL   Comment 1 Notify RN    Comment 2 Document in Chart   Glucose, capillary     Status: Abnormal   Collection Time: 08/17/17  5:01 PM  Result Value Ref Range   Glucose-Capillary 111 (H) 65 - 99 mg/dL   Comment 1 Notify RN    Comment 2 Document  in Chart     Current Facility-Administered Medications  Medication Dose Route Frequency Provider Last Rate Last Dose  . 0.9 %  sodium chloride infusion   Intravenous Continuous Auburn BilberryPatel, Shreyang, MD      . acetaminophen (TYLENOL) tablet 650 mg  650 mg Oral Q6H PRN Oralia ManisWillis, David, MD   650 mg at 08/14/17 0423   Or  . acetaminophen (TYLENOL) suppository 650 mg  650 mg Rectal Q6H PRN Oralia ManisWillis, David, MD      . amiodarone (PACERONE) tablet 400 mg  400 mg Oral BID Caroleen Hammanunningham, Kristin, FNP   400 mg at 08/17/17 0827  . aspirin EC tablet 325 mg  325 mg Oral Daily Auburn BilberryPatel, Shreyang, MD   325 mg at 08/17/17 0827  . chlordiazePOXIDE (LIBRIUM) capsule 10 mg  10 mg Oral TID PRN Auburn BilberryPatel, Shreyang, MD   10 mg at 08/17/17 1727  . enoxaparin (LOVENOX) injection 40 mg  40 mg Subcutaneous Q24H Oralia ManisWillis, David, MD   40 mg at 08/17/17 0124  . folic acid (FOLVITE) tablet 1 mg  1 mg Oral Daily Oralia ManisWillis, David, MD   1 mg at 08/17/17 0827  . insulin aspart (novoLOG) injection 0-5 Units  0-5 Units Subcutaneous QHS Oralia ManisWillis, David, MD      . insulin aspart (novoLOG) injection 0-9 Units  0-9 Units Subcutaneous TID WC Oralia ManisWillis, David, MD   1 Units at 08/17/17 [phone]  . loperamide (IMODIUM) capsule 4 mg  4 mg Oral Q6H Auburn BilberryPatel, Shreyang, MD   4 mg at 08/17/17 1244  . LORazepam (ATIVAN) injection 0-4 mg  0-4 mg Intravenous Rigoberto NoelQ12H Willis, David, MD   1 mg at 08/17/17 1244  . meloxicam (MOBIC) tablet 7.5 mg  7.5 mg Oral BID PRN Arnaldo Nataliamond, Michael S, MD   7.5 mg at 08/16/17 [id number]  . multivitamin with minerals  tablet 1 tablet  1 tablet Oral Daily Oralia ManisWillis, David, MD   1 tablet at 08/17/17 0827  . mycophenolate (CELLCEPT) capsule 500 mg  500 mg Oral q morning - 10a Oralia ManisWillis, David, MD   500 mg at 08/17/17 [id number]   And  . mycophenolate (CELLCEPT) capsule 1,000 mg  1,000 mg Oral QPM Oralia ManisWillis, David, MD   1,000 mg at 08/17/17 1727  . nicotine (NICODERM CQ - dosed in mg/24 hours) patch 21 mg  21 mg Transdermal Daily Arnaldo Nataliamond, Michael S, MD   21 mg at 08/17/17 0834  . ondansetron (ZOFRAN) tablet 4 mg  4 mg Oral Q6H PRN Oralia ManisWillis, David, MD      . sodium chloride flush (NS) 0.9 % injection 3 mL  3 mL Intravenous Q12H Auburn BilberryPatel, Shreyang, MD   3 mL at 08/17/17 0829  . sodium chloride flush (NS) 0.9 % injection 3 mL  3 mL Intravenous Q12H Auburn BilberryPatel, Shreyang, MD   3 mL at 08/17/17 0829  . sodium chloride flush (NS) 0.9 % injection 3 mL  3 mL Intravenous PRN Auburn BilberryPatel, Shreyang, MD      . thiamine (VITAMIN B-1) tablet 100 mg  100 mg Oral Daily Oralia ManisWillis, David, MD   100 mg at 08/17/17 0827   Or  . thiamine (B-1) injection 100 mg  100 mg Intravenous Daily Oralia ManisWillis, David, MD      . traMADol Janean Sark(ULTRAM) tablet 50 mg  50 mg Oral Q6H PRN Auburn BilberryPatel, Shreyang, MD   50 mg at 08/17/17 1456    Musculoskeletal: Strength & Muscle Tone: within normal limits Gait & Station: normal Patient leans: N/A  Psychiatric Specialty Exam: Physical Exam  Nursing  note and vitals reviewed. Constitutional: He appears well-developed and well-nourished.  HENT:  Head: Normocephalic and atraumatic.  Eyes: Pupils are equal, round, and reactive to light. Conjunctivae are normal.  Neck: Normal range of motion.  Cardiovascular: Regular rhythm and normal heart sounds.  Respiratory: Effort normal. No respiratory distress.  GI: Soft.  Musculoskeletal: Normal range of motion.  Neurological: He is alert.  Skin: Skin is warm and dry.  Psychiatric: He has a normal mood and affect. Judgment normal. His speech is delayed. He is slowed. Thought content is not paranoid. He  expresses no homicidal and no suicidal ideation. He exhibits abnormal recent memory.    Review of Systems  Constitutional: Negative.   HENT: Negative.   Eyes: Negative.   Respiratory: Negative.   Cardiovascular: Negative.   Gastrointestinal: Negative.   Musculoskeletal: Negative.   Skin: Negative.   Neurological: Negative.   Psychiatric/Behavioral: Positive for memory loss and substance abuse. Negative for depression, hallucinations and suicidal ideas. The patient is not nervous/anxious and does not have insomnia.     Blood pressure (!) 89/71, pulse 75, temperature (!) 97.5 F (36.4 C), temperature source Oral, resp. rate 19, height 5\' 9"  (1.753 m), weight 79.3 kg (174 lb 13.2 oz), SpO2 100 %.Body mass index is 25.82 kg/m.  General Appearance: Disheveled  Eye Contact:  Good  Speech:  Slow  Volume:  Decreased  Mood:  Euthymic  Affect:  Constricted  Thought Process:  Goal Directed  Orientation:  Full (Time, Place, and Person)  Thought Content:  Logical  Suicidal Thoughts:  No  Homicidal Thoughts:  No  Memory:  Immediate;   Fair Recent;   Fair Remote;   Fair  Judgement:  Fair  Insight:  Fair  Psychomotor Activity:  Decreased  Concentration:  Concentration: Fair  Recall:  [employer] of Knowledge:  Fair  Language:  Fair  Akathisia:  No  Handed:  Right  AIMS (if indicated):     Assets:  Desire for Improvement Social Support  ADL's:  Impaired  Cognition:  Impaired,  Mild  Sleep:        Treatment Plan Summary: Daily contact with patient to assess and evaluate symptoms and progress in treatment, Medication management and Plan At this point the patient probably has a mild degree of chronic dementia but he is not delirious not depressed not agitated.  Does not meet commitment criteria does not require inpatient treatment.  Supportive counseling and review of the life threatening importance of staying sober.  Strongly encouraged him to follow-up with [facility].  Disposition: No evidence of imminent risk to self or others at present.   Patient does not meet criteria for psychiatric inpatient admission. Supportive therapy provided about ongoing stressors.  Mordecai Rasmussen, MD 08/17/2017 5:53 PM

## 2017-08-18 LAB — GLUCOSE, CAPILLARY: Glucose-Capillary: 86 mg/dL (ref 65–99)

## 2017-08-18 MED ORDER — LORAZEPAM 2 MG/ML IJ SOLN
1.0000 mg | INTRAMUSCULAR | Status: AC
  Administered 2017-08-18: 1 mg via INTRAVENOUS
  Filled 2017-08-18: qty 1

## 2017-08-18 MED ORDER — ONE-A-DAY MENS PO TABS: 1.0000 | ORAL_TABLET | Freq: Every day | ORAL | 0 refills | Status: DC

## 2017-08-18 MED ORDER — LORAZEPAM 0.5 MG PO TABS
0.5000 mg | ORAL_TABLET | Freq: Once | ORAL | Status: AC
  Administered 2017-08-18: 0.5 mg via ORAL
  Filled 2017-08-18: qty 1

## 2017-08-18 MED ORDER — AMIODARONE HCL 200 MG PO TABS: 200.0000 mg | ORAL_TABLET | Freq: Every day | ORAL | Status: DC

## 2017-08-18 NOTE — Discharge Instructions (Signed)
Alcohol Abuse and Nutrition Alcohol abuse is any pattern of alcohol consumption that harms your health, relationships, or work. Alcohol abuse can affect how your body breaks down and absorbs nutrients from food by causing your liver to work abnormally. Additionally, many people who abuse alcohol do not eat enough carbohydrates, protein, fat, vitamins, and minerals. This can cause poor nutrition (malnutrition) and a lack of nutrients (nutrient deficiencies), which can lead to further complications. Nutrients that are commonly lacking (deficient) among people who abuse alcohol include:  Vitamins. ? Vitamin A. This is stored in your liver. It is important for your vision, metabolism, and ability to fight off infections (immunity). ? B vitamins. These include vitamins such as folate, thiamin, and niacin. These are important in new cell growth and maintenance. ? Vitamin C. This plays an important role in iron absorption, wound healing, and immunity. ? Vitamin D. This is produced by your liver, but you can also get vitamin D from food. Vitamin D is necessary for your body to absorb and use calcium.  Minerals. ? Calcium. This is important for your bones and your heart and blood vessel (cardiovascular) function. ? Iron. This is important for blood, muscle, and nervous system functioning. ? Magnesium. This plays an important role in muscle and nerve function, and it helps to control blood sugar and blood pressure. ? Zinc. This is important for the normal function of your nervous system and digestive system (gastrointestinal tract).  Nutrition is an essential component of therapy for alcohol abuse. Your health care provider or dietitian will work with you to design a plan that can help restore nutrients to your body and prevent potential complications. What is my plan? Your dietitian may develop a specific diet plan that is based on your condition and any other complications you may have. A diet plan will  commonly include:  A balanced diet. ? Grains: 6-8 oz per day. ? Vegetables: 2-3 cups per day. ? Fruits: 1-2 cups per day. ? Meat and other protein: 5-6 oz per day. ? Dairy: 2-3 cups per day.  Vitamin and mineral supplements.  What do I need to know about alcohol and nutrition?  Consume foods that are high in antioxidants, such as grapes, berries, nuts, green tea, and dark green and orange vegetables. This can help to counteract some of the stress that is placed on your liver by consuming alcohol.  Avoid food and drinks that are high in fat and sugar. Foods such as sugared soft drinks, salty snack foods, and candy contain empty calories. This means that they lack important nutrients such as protein, fiber, and vitamins.  Eat frequent meals and snacks. Try to eat 5-6 small meals each day.  Eat a variety of fresh fruits and vegetables each day. This will help you get plenty of water, fiber, and vitamins in your diet.  Drink plenty of water and other clear fluids. Try to drink at least 48-64 oz (1.5-2 L) of water per day.  If you are a vegetarian, eat a variety of protein-rich foods. Pair whole grains with plant-based proteins at meals and snacks to obtain the greatest nutrient benefit from your food. For example, eat rice with beans, put peanut butter on whole-grain toast, or eat oatmeal with sunflower seeds.  Soak beans and whole grains overnight before cooking. This can help your body to absorb the nutrients more easily.  Include foods fortified with vitamins and minerals in your diet. Commonly fortified foods include milk, orange juice, cereal, and bread.    If you are malnourished, your dietitian may recommend a high-protein, high-calorie diet. This may include: ? 2,000-3,000 calories (kilocalories) per day. ? 70-100 grams of protein per day.  Your health care provider may recommend a complete nutritional supplement beverage. This can help to restore calories, protein, and vitamins to  your body. Depending on your condition, you may be advised to consume this instead of or in addition to meals.  Limit your intake of caffeine. Replace drinks like coffee and black tea with decaffeinated coffee and herbal tea.  Eat a variety of foods that are high in omega fatty acids. These include fish, nuts and seeds, and soybeans. These foods may help your liver to recover and may also stabilize your mood.  Certain medicines may cause changes in your appetite, taste, and weight. Work with your health care provider and dietitian to make any adjustments to your medicines and diet plan.  Include other healthy lifestyle choices in your daily routine. ? Be physically active. ? Get enough sleep. ? Spend time doing activities that you enjoy.  If you are unable to take in enough food and calories by mouth, your health care provider may recommend a feeding tube. This is a tube that passes through your nose and throat, directly into your stomach. Nutritional supplement beverages can be given to you through the feeding tube to help you get the nutrients you need.  Take vitamin or mineral supplements as recommended by your health care provider. What foods can I eat? Grains Enriched pasta. Enriched rice. Fortified whole-grain bread. Fortified whole-grain cereal. Barley. Brown rice. Quinoa. Millet. Vegetables All fresh, frozen, and canned vegetables. Spinach. Kale. Artichoke. Carrots. Winter squash and pumpkin. Sweet potatoes. Broccoli. Cabbage. Cucumbers. Tomatoes. Sweet peppers. Green beans. Peas. Corn. Fruits All fresh and frozen fruits. Berries. Grapes. Mango. Papaya. Guava. Cherries. Apples. Bananas. Peaches. Plums. Pineapple. Watermelon. Cantaloupe. Oranges. Avocado. Meats and Other Protein Sources Beef liver. Lean beef. Pork. Fresh and canned chicken. Fresh fish. Oysters. Sardines. Canned tuna. Shrimp. Eggs with yolks. Nuts and seeds. Peanut butter. Beans and lentils. Soybeans.  Tofu. Dairy Whole, low-fat, and nonfat milk. Whole, low-fat, and nonfat yogurt. Cottage cheese. Sour cream. Hard and soft cheeses. Beverages Water. Herbal tea. Decaffeinated coffee. Decaffeinated green tea. 100% fruit juice. 100% vegetable juice. Instant breakfast shakes. Condiments Ketchup. Mayonnaise. Mustard. Salad dressing. Barbecue sauce. Sweets and Desserts Sugar-free ice cream. Sugar-free pudding. Sugar-free gelatin. Fats and Oils Butter. Vegetable oil, flaxseed oil, olive oil, and walnut oil. Other Complete nutrition shakes. Protein bars. Sugar-free gum. The items listed above may not be a complete list of recommended foods or beverages. Contact your dietitian for more options. What foods are not recommended? Grains Sugar-sweetened breakfast cereals. Flavored instant oatmeal. Fried breads. Vegetables Breaded or deep-fried vegetables. Fruits Dried fruit with added sugar. Candied fruit. Canned fruit in syrup. Meats and Other Protein Sources Breaded or deep-fried meats. Dairy Flavored milks. Fried cheese curds or fried cheese sticks. Beverages Alcohol. Sugar-sweetened soft drinks. Sugar-sweetened tea. Caffeinated coffee and tea. Condiments Sugar. Honey. Agave nectar. Molasses. Sweets and Desserts Chocolate. Cake. Cookies. Candy. Other Potato chips. Pretzels. Salted nuts. Candied nuts. The items listed above may not be a complete list of foods and beverages to avoid. Contact your dietitian for more information. This information is not intended to replace advice given to you by your health care provider. Make sure you discuss any questions you have with your health care provider. Document Released: 02/16/2005 Document Revised: 09/01/2015 Document Reviewed: 11/25/2013 Elsevier Interactive Patient Education    2018 [employer] Inc[employer]  

## 2017-08-18 NOTE — Progress Notes (Signed)
Pt ambulated in hall. Pt walked about 200 feet, including multiple times up and down the hall and once around the nurses station. Pt tolerated well. No SOB, no signs of distress. VS after exertion were stable. BP 119/89, O2 99-100 % on room air. No noted signs of distress, no concerns offered.

## 2017-08-18 NOTE — Progress Notes (Signed)
SUBJECTIVE: patient is feeling much better denies any chest pain or shortness of breath   Vitals:   08/18/17 0848 08/18/17 0856 08/18/17 0919 08/18/17 0922  BP: 124/81 125/88 (!) 130/92 118/89  Pulse: 76 73 86 89  Resp:      Temp: (!) 97.5 F (36.4 C)     TempSrc: Oral     SpO2: 100% 100% 100% 100%  Weight:      Height:        Intake/Output Summary (Last 24 hours) at 08/18/2017 1047 Last data filed at 08/18/2017 1004 Gross per 24 hour  Intake 2080 ml  Output 125 ml  Net 1955 ml    LABS: Basic Metabolic Panel: No results for input(s): NA, K, CL, CO2, GLUCOSE, BUN, CREATININE, CALCIUM, MG, PHOS in the last 72 hours. Liver Function Tests: No results for input(s): AST, ALT, ALKPHOS, BILITOT, PROT, ALBUMIN in the last 72 hours. No results for input(s): LIPASE, AMYLASE in the last 72 hours. CBC: Recent Labs    08/17/17 1806  WBC 5.5  NEUTROABS 3.2  HGB 12.4*  HCT 38.9*  MCV 77.6*  PLT 154   Cardiac Enzymes: No results for input(s): CKTOTAL, CKMB, CKMBINDEX, TROPONINI in the last 72 hours. BNP: Invalid input(s): POCBNP D-Dimer: No results for input(s): DDIMER in the last 72 hours. Hemoglobin A1C: No results for input(s): HGBA1C in the last 72 hours. Fasting Lipid Panel: No results for input(s): CHOL, HDL, LDLCALC, TRIG, CHOLHDL, LDLDIRECT in the last 72 hours. Thyroid Function Tests: No results for input(s): TSH, T4TOTAL, T3FREE, THYROIDAB in the last 72 hours.  Invalid input(s): FREET3 Anemia Panel: No results for input(s): VITAMINB12, FOLATE, FERRITIN, TIBC, IRON, RETICCTPCT in the last 72 hours.   PHYSICAL EXAM General: Well developed, well nourished, in no acute distress HEENT:  Normocephalic and atramatic Neck:  No JVD.  Lungs: Clear bilaterally to auscultation and percussion. Heart: HRRR . Normal S1 and S2 without gallops or murmurs.  Abdomen: Bowel sounds are positive, abdomen soft and non-tender  Msk:  Back normal, normal gait. Normal strength and tone  for age. Extremities: No clubbing, cyanosis or edema.   Neuro: Alert and oriented X 3. Psych:  Good affect, responds appropriately  TELEMETRY: sinus rhythm about 80 bpm  ASSESSMENT AND PLAN: alcohol abuse status post atrial fibrillation with rapid ventricular response rate right now in sinus rhythm. Advise changing amiodarone from 800 mg daily to 200 mg daily upon discharge. Advise follow-up Monday at 2 PM.  Principal Problem:   Alcohol abuse Active Problems:   Type II diabetes mellitus with manifestations (HCC)   Myasthenia gravis (HCC)   HTN (hypertension)   Atrial fibrillation with RVR (HCC)   Acute alcoholic intoxication with complication (HCC)   Chronic combined systolic and diastolic CHF (congestive heart failure) (HCC)    [place]erville Hijazi A, MD, [facility] 08/18/2017 10:47 AM

## 2017-08-18 NOTE — Care Management (Addendum)
RNCM received notification that patient will discharge to home today and will need a rolling walker.  I [facility] Jermaine will deliver ~15 minutes as patient is eager to discharge.  Patient agrees with walker and wait time.  No other RNCM needs.  Patient will use [facility] to get home.

## 2017-08-18 NOTE — Plan of Care (Signed)
  Problem: Education: Goal: Knowledge of General Education information will improve Outcome: Progressing   Problem: Health Behavior/Discharge Planning: Goal: Ability to manage health-related needs will improve Outcome: Progressing   Problem: Clinical Measurements: Goal: Will remain free from infection Outcome: Progressing   Problem: Activity: Goal: Risk for activity intolerance will decrease Outcome: Progressing   Problem: Coping: Goal: Level of anxiety will decrease Outcome: Progressing   Problem: Pain Managment: Goal: General experience of comfort will improve Outcome: Progressing   Problem: Safety: Goal: Ability to remain free from injury will improve Outcome: Progressing   Problem: Activity: Goal: Ability to tolerate increased activity will improve Outcome: Progressing

## 2017-08-18 NOTE — Progress Notes (Signed)
Pt d/c to home today.  IV removed intact.  Pt belongings Jewelry ( watch and rings) returned. D/c paperwork printed and reviewed w/pt.  All medication questions and concerns reviewed and pt states understanding.  All Rx's given to patient. Pt requested to wheelchaired outt for d/c.

## 2017-08-18 NOTE — Discharge Summary (Signed)
[employer] [facility] - [place] at [facility]   PATIENT NAME: Kurt Horton    MR#:  [id number]  DATE OF BIRTH:  04/15/1961  DATE OF ADMISSION:  08/13/2017 ADMITTING PHYSICIAN: Oralia Manis, MD  DATE OF DISCHARGE: 08/18/2017  1:00 PM  PRIMARY CARE PHYSICIAN: [name], MD    ADMISSION DIAGNOSIS:  Dehydration [E86.0] ETOH abuse [F10.10] Atrial fibrillation with RVR (HCC) [I48.91] Alcoholic intoxication with complication (HCC) [F10.929]  DISCHARGE DIAGNOSIS:  Principal Problem:   Alcohol abuse Active Problems:   Type II diabetes mellitus with manifestations (HCC)   Myasthenia gravis (HCC)   HTN (hypertension)   Atrial fibrillation with RVR (HCC)   Acute alcoholic intoxication with complication (HCC)   Chronic combined systolic and diastolic CHF (congestive heart failure) (HCC)   SECONDARY DIAGNOSIS:   Past Medical History:  Diagnosis Date  . Alcohol abuse   . Alcohol withdrawal (HCC) 11/12/2016  . Diabetes mellitus without complication (HCC)   . DJD (degenerative joint disease)   . Hypertension   . Myasthenia gravis (HCC)   . Renal disorder     HOSPITAL COURSE:   1.  Atrial fibrillation with rapid ventricular response.  He has converted to normal sinus rhythm with amiodarone.  Follow-up with Dr. Park Breed cardiology as outpatient.  My associate Dr. Auburn Bilberry ordered a tapering dose of the amiodarone. 2.  Hypotension yesterday.  Holding off on medications that can lower blood pressure.  Can recheck blood pressure as outpatient. 3.  Alcohol abuse.  Finish see what protocol.  No signs of withdrawal. 4.  Type 2 diabetes mellitus on metformin 5.  Chronic combined systolic and diastolic congestive heart failure.  Since blood pressure was on the lower side we needed to hold medications.  Restart Toprol tomorrow.  Can consider restarting medications as outpatient if blood pressure comes up.  Patient has as needed Lasix. 6.  History of myasthenia gravis on  CellCept 7.  Home health set up  DISCHARGE CONDITIONS:     CONSULTS OBTAINED:  Treatment Team:  Laurier Nancy, MD Clapacs, Jackquline Denmark, MD  DRUG ALLERGIES:   Allergies  Allergen Reactions  . Other Other (See Comments)    Patient states he can't take any "mycin" medications because myasthenia gravis.    DISCHARGE MEDICATIONS:   Allergies as of 08/18/2017      Reactions   Other Other (See Comments)   Patient states he can't take any "mycin" medications because myasthenia gravis.      Medication List    STOP taking these medications   carvedilol 6.25 MG tablet Commonly known as:  COREG   lisinopril 10 MG tablet Commonly known as:  PRINIVIL,ZESTRIL   torsemide 10 MG tablet Commonly known as:  DEMADEX     TAKE these medications   amiodarone 400 MG tablet Commonly known as:  PACERONE Take 1 tablet (400 mg total) by mouth 2 (two) times daily for 7 days.   amiodarone 200 MG tablet Commonly known as:  PACERONE Take 1 tablet (200 mg total) by mouth daily. Start taking on:  08/25/2017   aspirin 325 MG EC tablet Take 1 tablet (325 mg total) by mouth daily.   B-complex with vitamin C tablet Take 1 tablet by mouth daily.   chlordiazePOXIDE 5 MG capsule Commonly known as:  LIBRIUM Take 1 capsule (5 mg total) by mouth 3 (three) times daily as needed for anxiety.   citalopram 20 MG tablet Commonly known as:  CELEXA Take 1 tablet (20 mg total) by  mouth daily.   furosemide 40 MG tablet Commonly known as:  LASIX Take 40 mg by mouth daily as needed for fluid (if taking prednisone).   gabapentin 300 MG capsule Commonly known as:  NEURONTIN Take 1 capsule (300 mg total) by mouth 3 (three) times daily. What changed:  how much to take   metFORMIN 500 MG tablet Commonly known as:  GLUCOPHAGE Take 1 tablet (500 mg total) by mouth daily.   metoprolol succinate 50 MG 24 hr tablet Commonly known as:  TOPROL-XL Take 1 tablet (50 mg total) by mouth daily. Take with or  immediately following a meal.   multivitamin Tabs tablet Take 1 tablet by mouth daily.   mycophenolate 500 MG tablet Commonly known as:  CELLCEPT Take 1 tablet (500MG ) by mouth every morning and 2 tablets (1000MG ) by mouth every evening   potassium chloride 10 MEQ tablet Commonly known as:  K-DUR Take 10 mEq by mouth daily.            Durable Medical Equipment  (From admission, onward)        Start     Ordered   08/18/17 0947  For home use only DME Walker rolling  Once    Question:  Patient needs a walker to treat with the following condition  Answer:  Unsteady gait   08/18/17 0946       DISCHARGE INSTRUCTIONS:    Follow-up with Dr. Park BreedKahn cardiology next week Follow-up PMD 6 days  If you experience worsening of your admission symptoms, develop shortness of breath, life threatening emergency, suicidal or homicidal thoughts you must seek medical attention immediately by calling 911 or calling your MD immediately  if symptoms less severe.  You Must read complete instructions/literature along with all the possible adverse reactions/side effects for all the Medicines you take and that have been prescribed to you. Take any new Medicines after you have completely understood and accept all the possible adverse reactions/side effects.   Please note  You were cared for by a hospitalist during your hospital stay. If you have any questions about your discharge medications or the care you received while you were in the hospital after you are discharged, you can call the unit and asked to speak with the hospitalist on call if the hospitalist that took care of you is not available. Once you are discharged, your primary care physician will handle any further medical issues. Please note that NO REFILLS for any discharge medications will be authorized once you are discharged, as it is imperative that you return to your primary care physician (or establish a relationship with a primary care  physician if you do not have one) for your aftercare needs so that they can reassess your need for medications and monitor your lab values.    Today   CHIEF COMPLAINT:   Chief Complaint  Patient presents with  . Alcohol Intoxication    HISTORY OF PRESENT ILLNESS:  Kurt Horton  is a 57 y.o. male came in and found to be in atrial fibrillation   VITAL SIGNS:  Blood pressure 118/89, pulse 89, temperature (!) 97.5 F (36.4 C), temperature source Oral, resp. rate 18, height 5\' 9"  (1.753 m), weight 80.7 kg (178 lb), SpO2 100 %.    PHYSICAL EXAMINATION:  GENERAL:  57 y.o.-year-old patient lying in the bed with no acute distress.  EYES: Pupils equal, round, reactive to light and accommodation. No scleral icterus. Extraocular muscles intact.  HEENT: Head atraumatic, normocephalic. Oropharynx and  nasopharynx clear.  NECK:  Supple, no jugular venous distention. No thyroid enlargement, no tenderness.  LUNGS: Normal breath sounds bilaterally, no wheezing, rales,rhonchi or crepitation. No use of accessory muscles of respiration.  CARDIOVASCULAR: S1, S2 normal. No murmurs, rubs, or gallops.  ABDOMEN: Soft, non-tender, non-distended. Bowel sounds present. No organomegaly or mass.  EXTREMITIES: No pedal edema, cyanosis, or clubbing.  NEUROLOGIC: Cranial nerves II through XII are intact. Muscle strength 5/5 in all extremities. Sensation intact. Gait not checked.  PSYCHIATRIC: The patient is alert and oriented x 3.  SKIN: No obvious rash, lesion, or ulcer.   DATA REVIEW:   CBC Recent Labs  Lab 08/17/17 1806  WBC 5.5  HGB 12.4*  HCT 38.9*  PLT 154    Chemistries  Recent Labs  Lab 08/13/17 1759  08/15/17 0426  NA 139   < > 135  K 3.2*   < > 3.8  CL 101   < > 102  CO2 24   < > 25  GLUCOSE 123*   < > 132*  BUN 18   < > 10  CREATININE 0.80   < > 0.62  CALCIUM 8.1*   < > 9.0  MG  --   --  1.9  AST 64*  --   --   ALT 36  --   --   ALKPHOS 81  --   --   BILITOT 0.9  --   --    <  > = values in this interval not displayed.    Cardiac Enzymes Recent Labs  Lab 08/14/17 1230  TROPONINI 0.05*    Microbiology Results  Results for orders placed or performed during the hospital encounter of 08/13/17  C difficile quick scan w PCR reflex     Status: None   Collection Time: 08/14/17  5:41 PM  Result Value Ref Range Status   C Diff antigen NEGATIVE NEGATIVE Final   C Diff toxin NEGATIVE NEGATIVE Final   C Diff interpretation No C. difficile detected.  Final    Comment: VALID Performed at [facility], [address] [place] [place], [place] [address]     RADIOLOGY:  Ct Head Wo Contrast  Result Date: 08/17/2017 CLINICAL DATA:  Subacute neuro deficit. Left-sided facial droop and blurred vision starting 3 days ago. History of myasthenia gravis EXAM: CT HEAD WITHOUT CONTRAST TECHNIQUE: Contiguous axial images were obtained from the base of the skull through the vertex without intravenous contrast. COMPARISON:  06/07/2017 FINDINGS: Brain: No evidence of acute infarction, hemorrhage, hydrocephalus, extra-axial collection or mass lesion/mass effect. Generalized volume loss for age. Stable low-density in the right centrum semiovale attributed to nonspecific remote insult. Vascular: Negative Skull: Negative Sinuses/Orbits: Negative IMPRESSION: 1. No acute finding or explanation for deficits. 2. Brain atrophy. Electronically Signed   By: Marnee Spring M.D.   On: 08/17/2017 17:57    Management plans discussed with the patient,  and he is in agreement.  CODE STATUS:     Code Status Orders  (From admission, onward)        Start     Ordered   08/14/17 0031  Full code  Continuous     08/14/17 0030    Code Status History    Date Active Date Inactive Code Status Order ID Comments User Context   06/04/2017 0116 06/06/2017 1701 Full Code [id number]  Oralia Manis, MD Inpatient   03/19/2017 1559 03/20/2017 2128 Full Code [id number]  Arnaldo Natal, MD ED   02/25/2017 [phone]  03/01/2017 1829 Full Code [id number]  Houston Siren, MD Inpatient   11/12/2016 1533 11/14/2016 1425 Full Code [id number]  Marguarite Arbour, MD Inpatient   12/26/2015 1927 12/30/2015 1604 Full Code [id number]  Marguarite Arbour, MD Inpatient      TOTAL TIME TAKING CARE OF THIS PATIENT: 35 minutes.    Alford Highland M.D on 08/18/2017 at 2:12 PM  Between 7am to 6pm - Pager - [phone]  After 6pm go to [email or web address] - password [email or web address]  [employer] Office  [phone]  CC: Primary care physician; [name], MD

## 2017-08-18 NOTE — Progress Notes (Signed)
Pt ask me if he can have ativan for anxiety. Mentioned that he have PRN Librium but states that it don't help him. Talked to doctor Weiting and he ordered for have the pt to have 0.5 mg of Ativan once for anxiety.  Will continue to monitor.

## 2017-08-18 NOTE — Progress Notes (Signed)
Pt has a scheduled CIWA at 1200 but his ativan expired. Doctor Hilton SinclairWeiting was notified and states pt is for discharge today. Will continue to monitor.

## 2017-08-24 ENCOUNTER — Inpatient Hospital Stay
Admission: EM | Admit: 2017-08-24 | Discharge: 2017-08-27 | DRG: 897 | Disposition: A | Discharge status: Home Health | Payer: Medicaid Other | Attending: Internal Medicine | Admitting: Internal Medicine

## 2017-08-24 ENCOUNTER — Other Ambulatory Visit: Payer: Self-pay

## 2017-08-24 ENCOUNTER — Encounter: Payer: Self-pay | Admitting: Internal Medicine

## 2017-08-24 DIAGNOSIS — F17210 Nicotine dependence, cigarettes, uncomplicated: Secondary | ICD-10-CM | POA: Diagnosis present

## 2017-08-24 DIAGNOSIS — F10930 Alcohol use, unspecified with withdrawal, uncomplicated: Secondary | ICD-10-CM

## 2017-08-24 DIAGNOSIS — I10 Essential (primary) hypertension: Secondary | ICD-10-CM | POA: Diagnosis present

## 2017-08-24 DIAGNOSIS — M4696 Unspecified inflammatory spondylopathy, lumbar region: Secondary | ICD-10-CM | POA: Diagnosis present

## 2017-08-24 DIAGNOSIS — Z7982 Long term (current) use of aspirin: Secondary | ICD-10-CM | POA: Diagnosis not present

## 2017-08-24 DIAGNOSIS — F10231 Alcohol dependence with withdrawal delirium: Principal | ICD-10-CM | POA: Diagnosis present

## 2017-08-24 DIAGNOSIS — G629 Polyneuropathy, unspecified: Secondary | ICD-10-CM

## 2017-08-24 DIAGNOSIS — E1142 Type 2 diabetes mellitus with diabetic polyneuropathy: Secondary | ICD-10-CM | POA: Diagnosis present

## 2017-08-24 DIAGNOSIS — F10230 Alcohol dependence with withdrawal, uncomplicated: Secondary | ICD-10-CM

## 2017-08-24 DIAGNOSIS — F10939 Alcohol use, unspecified with withdrawal, unspecified: Secondary | ICD-10-CM | POA: Diagnosis present

## 2017-08-24 DIAGNOSIS — Z7984 Long term (current) use of oral hypoglycemic drugs: Secondary | ICD-10-CM | POA: Diagnosis not present

## 2017-08-24 DIAGNOSIS — I480 Paroxysmal atrial fibrillation: Secondary | ICD-10-CM | POA: Diagnosis present

## 2017-08-24 DIAGNOSIS — E876 Hypokalemia: Secondary | ICD-10-CM | POA: Diagnosis present

## 2017-08-24 DIAGNOSIS — G7000 Myasthenia gravis without (acute) exacerbation: Secondary | ICD-10-CM | POA: Diagnosis present

## 2017-08-24 DIAGNOSIS — F10239 Alcohol dependence with withdrawal, unspecified: Secondary | ICD-10-CM | POA: Diagnosis present

## 2017-08-24 HISTORY — DX: Unspecified atrial fibrillation: I48.91

## 2017-08-24 LAB — CBC WITH DIFFERENTIAL/PLATELET
Band Neutrophils: 1 %
Basophils Absolute: 0.2 K/uL — ABNORMAL HIGH (ref 0–0.1)
Basophils Relative: 3 %
Blasts: 0 %
Eosinophils Absolute: 0 K/uL (ref 0–0.7)
Eosinophils Relative: 0 %
HCT: 37.9 % — ABNORMAL LOW (ref 40.0–52.0)
Hemoglobin: 12.2 g/dL — ABNORMAL LOW (ref 13.0–18.0)
Lymphocytes Relative: 40 %
Lymphs Abs: 2.4 K/uL (ref 1.0–3.6)
MCH: 24.7 pg — ABNORMAL LOW (ref 26.0–34.0)
MCHC: 32.3 g/dL (ref 32.0–36.0)
MCV: 76.4 fL — ABNORMAL LOW (ref 80.0–100.0)
Metamyelocytes Relative: 0 %
Monocytes Absolute: 0.7 K/uL (ref 0.2–1.0)
Monocytes Relative: 11 %
Myelocytes: 0 %
Neutro Abs: 2.8 K/uL (ref 1.4–6.5)
Neutrophils Relative %: 45 %
Other: 0 %
Platelets: 395 K/uL (ref 150–440)
Promyelocytes Relative: 0 %
RBC: 4.96 MIL/uL (ref 4.40–5.90)
RDW: 24.7 % — ABNORMAL HIGH (ref 11.5–14.5)
WBC: 6.1 K/uL (ref 3.8–10.6)
nRBC: 0 /100{WBCs}

## 2017-08-24 LAB — BASIC METABOLIC PANEL
ANION GAP: 14 (ref 5–15)
BUN: 13 mg/dL (ref 6–20)
CHLORIDE: 97 mmol/L — AB (ref 101–111)
CO2: 27 mmol/L (ref 22–32)
Calcium: 9.3 mg/dL (ref 8.9–10.3)
Creatinine, Ser: 0.94 mg/dL (ref 0.61–1.24)
GFR calc Af Amer: >60 mL/min (ref >60)
GLUCOSE: 108 mg/dL — AB (ref 65–99)
POTASSIUM: 3 mmol/L — AB (ref 3.5–5.1)
Sodium: 138 mmol/L (ref 135–145)

## 2017-08-24 LAB — MAGNESIUM: MAGNESIUM: 1.3 mg/dL — AB (ref 1.7–2.4)

## 2017-08-24 LAB — ETHANOL: Alcohol, Ethyl (B): <10 mg/dL (ref <10)

## 2017-08-24 LAB — TSH: TSH: 1.997 u[IU]/mL (ref 0.350–4.500)

## 2017-08-24 MED ORDER — LORAZEPAM 2 MG/ML IJ SOLN
0.0000 mg | Freq: Two times a day (BID) | INTRAMUSCULAR | Status: DC
  Administered 2017-08-26: 18:00:00 2 mg via INTRAVENOUS
  Administered 2017-08-27: 06:00:00 1 mg via INTRAVENOUS
  Filled 2017-08-24 (×2): qty 1

## 2017-08-24 MED ORDER — LORAZEPAM 2 MG/ML IJ SOLN
2.0000 mg | Freq: Once | INTRAMUSCULAR | Status: AC
  Administered 2017-08-24: 2 mg via INTRAVENOUS
  Filled 2017-08-24: qty 1

## 2017-08-24 MED ORDER — MYCOPHENOLATE MOFETIL 250 MG PO CAPS
1000.0000 mg | ORAL_CAPSULE | Freq: Every day | ORAL | Status: DC
  Administered 2017-08-24 – 2017-08-26 (×3): 1000 mg via ORAL
  Filled 2017-08-24 (×4): qty 4

## 2017-08-24 MED ORDER — B COMPLEX-C PO TABS
1.0000 | ORAL_TABLET | Freq: Every day | ORAL | Status: DC
  Administered 2017-08-25 – 2017-08-27 (×3): 1 via ORAL
  Filled 2017-08-24 (×3): qty 1

## 2017-08-24 MED ORDER — ONDANSETRON HCL 4 MG PO TABS: 4.0000 mg | ORAL_TABLET | Freq: Four times a day (QID) | ORAL | Status: DC | PRN

## 2017-08-24 MED ORDER — ADULT MULTIVITAMIN W/MINERALS CH
1.0000 | ORAL_TABLET | Freq: Every day | ORAL | Status: DC
  Administered 2017-08-25 – 2017-08-27 (×3): 1 via ORAL
  Filled 2017-08-24 (×3): qty 1

## 2017-08-24 MED ORDER — CITALOPRAM HYDROBROMIDE 20 MG PO TABS
20.0000 mg | ORAL_TABLET | Freq: Every day | ORAL | Status: DC
  Administered 2017-08-25 – 2017-08-27 (×3): 20 mg via ORAL
  Filled 2017-08-24 (×3): qty 1

## 2017-08-24 MED ORDER — ASPIRIN EC 325 MG PO TBEC
325.0000 mg | DELAYED_RELEASE_TABLET | Freq: Every day | ORAL | Status: DC
  Administered 2017-08-25 – 2017-08-27 (×3): 325 mg via ORAL
  Filled 2017-08-24 (×3): qty 1

## 2017-08-24 MED ORDER — MORPHINE SULFATE (PF) 2 MG/ML IV SOLN
2.0000 mg | INTRAVENOUS | Status: DC | PRN
  Administered 2017-08-24 – 2017-08-27 (×16): 2 mg via INTRAVENOUS
  Filled 2017-08-24 (×15): qty 1

## 2017-08-24 MED ORDER — ONDANSETRON HCL 4 MG/2ML IJ SOLN: 4.0000 mg | Freq: Four times a day (QID) | INTRAMUSCULAR | Status: DC | PRN

## 2017-08-24 MED ORDER — MYCOPHENOLATE MOFETIL 250 MG PO CAPS
500.0000 mg | ORAL_CAPSULE | ORAL | Status: DC
  Administered 2017-08-25 – 2017-08-27 (×3): 500 mg via ORAL
  Filled 2017-08-24 (×3): qty 2

## 2017-08-24 MED ORDER — METFORMIN HCL 500 MG PO TABS
500.0000 mg | ORAL_TABLET | Freq: Every day | ORAL | Status: DC
  Administered 2017-08-25 – 2017-08-27 (×3): 500 mg via ORAL
  Filled 2017-08-24 (×3): qty 1

## 2017-08-24 MED ORDER — DOCUSATE SODIUM 100 MG PO CAPS
100.0000 mg | ORAL_CAPSULE | Freq: Two times a day (BID) | ORAL | Status: DC
  Administered 2017-08-25: 23:00:00 100 mg via ORAL
  Filled 2017-08-24 (×4): qty 1

## 2017-08-24 MED ORDER — ACETAMINOPHEN 650 MG RE SUPP: 650.0000 mg | Freq: Four times a day (QID) | RECTAL | Status: DC | PRN

## 2017-08-24 MED ORDER — VITAMIN B-1 100 MG PO TABS
100.0000 mg | ORAL_TABLET | Freq: Every day | ORAL | Status: DC
  Administered 2017-08-24 – 2017-08-27 (×4): 100 mg via ORAL
  Filled 2017-08-24 (×4): qty 1

## 2017-08-24 MED ORDER — GABAPENTIN 300 MG PO CAPS
600.0000 mg | ORAL_CAPSULE | Freq: Three times a day (TID) | ORAL | Status: DC
  Administered 2017-08-24 – 2017-08-27 (×8): 600 mg via ORAL
  Filled 2017-08-24 (×8): qty 2

## 2017-08-24 MED ORDER — GABAPENTIN 600 MG PO TABS
300.0000 mg | ORAL_TABLET | Freq: Once | ORAL | Status: AC
  Administered 2017-08-24: 300 mg via ORAL
  Filled 2017-08-24: qty 1

## 2017-08-24 MED ORDER — ENOXAPARIN SODIUM 40 MG/0.4ML ~~LOC~~ SOLN
40.0000 mg | SUBCUTANEOUS | Status: DC
  Administered 2017-08-24 – 2017-08-25 (×2): 40 mg via SUBCUTANEOUS
  Filled 2017-08-24 (×2): qty 0.4

## 2017-08-24 MED ORDER — METOPROLOL SUCCINATE ER 50 MG PO TB24
50.0000 mg | ORAL_TABLET | Freq: Every day | ORAL | Status: DC
  Administered 2017-08-25 – 2017-08-27 (×3): 50 mg via ORAL
  Filled 2017-08-24 (×3): qty 1

## 2017-08-24 MED ORDER — LORAZEPAM 2 MG/ML IJ SOLN
1.0000 mg | Freq: Four times a day (QID) | INTRAMUSCULAR | Status: DC | PRN
  Administered 2017-08-25 – 2017-08-27 (×2): 1 mg via INTRAVENOUS
  Filled 2017-08-24: qty 1

## 2017-08-24 MED ORDER — LORAZEPAM 2 MG/ML IJ SOLN
0.0000 mg | Freq: Four times a day (QID) | INTRAMUSCULAR | Status: AC
  Administered 2017-08-24: 18:00:00 2 mg via INTRAVENOUS
  Administered 2017-08-25 (×3): 1 mg via INTRAVENOUS
  Administered 2017-08-26 (×3): 2 mg via INTRAVENOUS
  Filled 2017-08-24 (×9): qty 1

## 2017-08-24 MED ORDER — SODIUM CHLORIDE 0.9 % IV BOLUS
1000.0000 mL | Freq: Once | INTRAVENOUS | Status: AC
  Administered 2017-08-24: 1000 mL via INTRAVENOUS

## 2017-08-24 MED ORDER — OXYCODONE HCL 5 MG PO TABS
5.0000 mg | ORAL_TABLET | ORAL | Status: DC | PRN
  Administered 2017-08-24 – 2017-08-26 (×5): 5 mg via ORAL
  Filled 2017-08-24 (×5): qty 1

## 2017-08-24 MED ORDER — KETOROLAC TROMETHAMINE 30 MG/ML IJ SOLN
30.0000 mg | Freq: Once | INTRAMUSCULAR | Status: AC
  Administered 2017-08-24: 30 mg via INTRAVENOUS
  Filled 2017-08-24: qty 1

## 2017-08-24 MED ORDER — POTASSIUM CHLORIDE CRYS ER 20 MEQ PO TBCR
40.0000 meq | EXTENDED_RELEASE_TABLET | ORAL | Status: AC
  Administered 2017-08-24 (×2): 40 meq via ORAL
  Filled 2017-08-24 (×2): qty 2

## 2017-08-24 MED ORDER — ACETAMINOPHEN 325 MG PO TABS: 650.0000 mg | ORAL_TABLET | Freq: Four times a day (QID) | ORAL | Status: DC | PRN

## 2017-08-24 MED ORDER — AMIODARONE HCL 200 MG PO TABS
200.0000 mg | ORAL_TABLET | Freq: Every day | ORAL | Status: DC
  Administered 2017-08-24 – 2017-08-27 (×4): 200 mg via ORAL
  Filled 2017-08-24 (×4): qty 1

## 2017-08-24 MED ORDER — SODIUM CHLORIDE 0.9 % IV SOLN
INTRAVENOUS | Status: AC
  Administered 2017-08-24 – 2017-08-25 (×2): via INTRAVENOUS

## 2017-08-24 MED ORDER — POLYETHYLENE GLYCOL 3350 17 G PO PACK
17.0000 g | PACK | Freq: Every day | ORAL | Status: DC | PRN
Start: 2017-08-24 — End: 2017-08-27

## 2017-08-24 MED ORDER — FOLIC ACID 1 MG PO TABS
1.0000 mg | ORAL_TABLET | Freq: Every day | ORAL | Status: DC
  Administered 2017-08-24 – 2017-08-27 (×4): 1 mg via ORAL
  Filled 2017-08-24 (×4): qty 1

## 2017-08-24 MED ORDER — THIAMINE HCL 100 MG/ML IJ SOLN
100.0000 mg | Freq: Every day | INTRAMUSCULAR | Status: DC
  Filled 2017-08-24 (×4): qty 1

## 2017-08-24 MED ORDER — MORPHINE SULFATE (PF) 2 MG/ML IV SOLN
INTRAVENOUS | Status: AC
  Administered 2017-08-24: 2 mg via INTRAVENOUS
  Filled 2017-08-24: qty 1

## 2017-08-24 MED ORDER — ADULT MULTIVITAMIN W/MINERALS CH: 1.0000 | ORAL_TABLET | Freq: Every day | ORAL | Status: DC

## 2017-08-24 MED ORDER — LORAZEPAM 1 MG PO TABS
1.0000 mg | ORAL_TABLET | Freq: Four times a day (QID) | ORAL | Status: DC | PRN
  Administered 2017-08-26: 22:00:00 1 mg via ORAL
  Filled 2017-08-24: qty 1

## 2017-08-24 NOTE — ED Provider Notes (Signed)
[facility] Emergency Department Provider Note ____________________________________________   First MD Initiated Contact with Patient 08/24/17 1102     (approximate)  I have reviewed the triage vital signs and the nursing notes.   HISTORY  Chief Complaint Peripheral Neuropathy and Delirium Tremens (DTS)    HPI Kurt Horton is a 57 y.o. male with PMH as noted below who presents with bilateral numbness to his feet, chronic but acutely worsened in the last several days, and consistent with his prior neuropathy (although patient states there is never been this severe).  Patient states that due to this, he has been unable to ambulate.  The patient also reports that he feels like he is in alcohol withdrawal.  He states that he drinks daily, and that his last drink was last night.   Past Medical History:  Diagnosis Date  . Alcohol abuse   . Alcohol withdrawal (HCC) 11/12/2016  . Diabetes mellitus without complication (HCC)   . DJD (degenerative joint disease)   . Hypertension   . Myasthenia gravis (HCC)   . Renal disorder     Patient Active Problem List   Diagnosis Date Noted  . Chronic combined systolic and diastolic CHF (congestive heart failure) (HCC) 08/13/2017  . Alcohol withdrawal delirium (HCC) 02/26/2017  . Atrial fibrillation with RVR (HCC) 02/25/2017  . Acute alcoholic intoxication with complication (HCC)   . Alcohol abuse   . Alcohol withdrawal (HCC) 11/13/2016  . Substance induced mood disorder (HCC) 11/13/2016  . SVT (supraventricular tachycardia) (HCC) 11/12/2016  . HTN (hypertension) 11/12/2016  . EtOH dependence (HCC) 11/12/2016  . Lumbar radiculopathy 08/24/2016  . Depression with anxiety 08/24/2016  . Chronic low back pain 01/04/2016  . Pressure ulcer 12/29/2015  . Osteomyelitis (HCC) 12/26/2015  . PVD (peripheral vascular disease) (HCC) 12/26/2015  . Type II diabetes mellitus with manifestations (HCC) 12/26/2015  . Myasthenia  gravis (HCC) 12/26/2015    Past Surgical History:  Procedure Laterality Date  . AMPUTATION TOE Left 12/29/2015   Procedure: AMPUTATION TOE;  Surgeon: Gwyneth Revels, DPM;  Location: [facility] ORS;  Service: Podiatry;  Laterality: Left;  . PERIPHERAL VASCULAR CATHETERIZATION N/A 12/27/2015   Procedure: Lower Extremity Angiography;  Surgeon: Annice Needy, MD;  Location: [facility] INVASIVE CV LAB;  Service: Cardiovascular;  Laterality: N/A;    Prior to Admission medications   Medication Sig Start Date End Date Taking? Authorizing Provider  amiodarone (PACERONE) 200 MG tablet Take 1 tablet (200 mg total) by mouth daily. 08/25/17   Auburn Bilberry, MD  amiodarone (PACERONE) 400 MG tablet Take 1 tablet (400 mg total) by mouth 2 (two) times daily for 7 days. 08/17/17 08/24/17  Auburn Bilberry, MD  aspirin EC 325 MG EC tablet Take 1 tablet (325 mg total) by mouth daily. 08/18/17   Auburn Bilberry, MD  B Complex-C (B-COMPLEX WITH VITAMIN C) tablet Take 1 tablet by mouth daily.    [provider]  chlordiazePOXIDE (LIBRIUM) 5 MG capsule Take 1 capsule (5 mg total) by mouth 3 (three) times daily as needed for anxiety. 08/17/17   Auburn Bilberry, MD  citalopram (CELEXA) 20 MG tablet Take 1 tablet (20 mg total) by mouth daily. 08/24/16   Sater, Pearletha Furl, MD  furosemide (LASIX) 40 MG tablet Take 40 mg by mouth daily as needed for fluid (if taking prednisone).     [provider]  gabapentin (NEURONTIN) 300 MG capsule Take 1 capsule (300 mg total) by mouth 3 (three) times daily. Patient taking differently:  Take 600 mg 3 (three) times daily by mouth.  03/01/17   Enid Baas, MD  metFORMIN (GLUCOPHAGE) 500 MG tablet Take 1 tablet (500 mg total) by mouth daily. 12/30/15   Alford Highland, MD  metoprolol succinate (TOPROL-XL) 50 MG 24 hr tablet Take 1 tablet (50 mg total) by mouth daily. Take with or immediately following a meal. 08/18/17   Auburn Bilberry, MD  multivitamin (ONE-A-DAY MEN'S) TABS tablet  Take 1 tablet by mouth daily. 08/18/17   Alford Highland, MD  mycophenolate (CELLCEPT) 500 MG tablet Take 1 tablet (500MG ) by mouth every morning and 2 tablets (1000MG ) by mouth every evening    [provider]  potassium chloride (K-DUR) 10 MEQ tablet Take 10 mEq by mouth daily. 06/21/17 06/21/18  [provider]    Allergies Other  Family History  Problem Relation Age of Onset  . Rheum arthritis Father     Social History Social History   Tobacco Use  . Smoking status: Current Every Day Smoker    Packs/day: 0.50    Types: Cigarettes  . Smokeless tobacco: Never Used  Substance Use Topics  . Alcohol use: Yes    Comment: Pt had tequila and rum, at least a 5th  . Drug use: No    Review of Systems  Constitutional: No fever. Eyes: No redness. ENT: No sore throat. Cardiovascular: Denies chest pain. Respiratory: Denies shortness of breath. Gastrointestinal: No vomiting. Genitourinary: Negative for dysuria.  Musculoskeletal: Negative for back pain. Skin: Negative for rash. Neurological: Positive for peripheral numbness.   ____________________________________________   PHYSICAL EXAM:  VITAL SIGNS: ED Triage Vitals  Enc Vitals Group     BP 08/24/17 1039 (!) 147/84     Pulse Rate 08/24/17 1039 82     Resp 08/24/17 1039 18     Temp 08/24/17 1039 99 F (37.2 C)     Temp Source 08/24/17 1039 Oral     SpO2 08/24/17 1039 97 %     Weight 08/24/17 1040 178 lb (80.7 kg)     Height 08/24/17 1040 5\' 9"  (1.753 m)     Head Circumference --      Peak Flow --      Pain Score 08/24/17 1040 8     Pain Loc --      Pain Edu? --      Excl. in GC? --     Constitutional: Alert and oriented.  Disheveled and uncomfortable appearing. Eyes: Conjunctivae are normal.  EOMI.  PERRLA. Head: Atraumatic. Nose: No congestion/rhinnorhea. Mouth/Throat: Mucous membranes are dry.   Neck: Normal range of motion.  Cardiovascular: Normal rate, regular rhythm. Grossly normal  heart sounds.  Good peripheral circulation. Respiratory: Normal respiratory effort.  No retractions. Lungs CTAB. Gastrointestinal: Soft and nontender. No distention.  Genitourinary: No CVA tenderness. Musculoskeletal: No lower extremity edema.  Extremities warm and well perfused.  2+ DP pulses and normal color to feet bilaterally. Neurologic:  Normal speech and language decreased sensation of bilateral feet.  Motor intact to all extremities.   Skin:  Skin is warm and dry. No rash noted. Psychiatric: Somewhat flat affect.  Normal speech and behavior.  ____________________________________________   LABS (all labs ordered are listed, but only abnormal results are displayed)  Labs Reviewed  BASIC METABOLIC PANEL - Abnormal; Notable for the following components:      Result Value   Potassium 3.0 (*)    Chloride 97 (*)    Glucose, Bld 108 (*)    All other components  within normal limits  CBC WITH DIFFERENTIAL/PLATELET - Abnormal; Notable for the following components:   Hemoglobin 12.2 (*)    HCT 37.9 (*)    MCV 76.4 (*)    MCH 24.7 (*)    RDW 24.7 (*)    Basophils Absolute 0.2 (*)    All other components within normal limits  ETHANOL   ____________________________________________  EKG  ED ECG REPORT I, Dionne BucySebastian Ambermarie Honeyman, the attending physician, personally viewed and interpreted this ECG.  Date: 08/24/2017 EKG Time: 1212 Rate: 74 Rhythm: normal sinus rhythm QRS Axis: Left axis deviation Intervals: normal ST/T Wave abnormalities: Abnormal R wave progression Narrative Interpretation: Nonspecific findings; No evidence of acute ischemia  ____________________________________________  RADIOLOGY    ____________________________________________   PROCEDURES  Procedure(s) performed: No  Procedures  Critical Care performed: No ____________________________________________   INITIAL IMPRESSION / ASSESSMENT AND PLAN / ED COURSE  Pertinent labs & imaging results that  were available during my care of the patient were reviewed by me and considered in my medical decision making (see chart for details).  57 year old male with history of alcohol abuse and other PMH as noted above presents primarily for numbness to bilateral feet consistent with prior neuropathy but more severe.  He also states he is in alcohol withdrawal.  On exam, the patient is weak and disheveled appearing, and extremely tremulous.  He has significant tongue fasciculation.  Bilateral feet with motor intact, decreased sensation, but 2+ DP pulses and no other abnormalities.  Foot numbness is consistent with peripheral neuropathy which is likely acute exacerbation of a chronic condition.  Patient does appear to be in active alcohol withdrawal; although his vital signs are not significantly abnormal, given his degree of tremor, difficulty ambulating, and history of severe withdrawal in the past, he will require IV fluids, benzos, and likely admission.  We will treat symptomatically for the neuropathy with gabapentin.    ----------------------------------------- 2:39 PM on 08/24/2017 -----------------------------------------  Patient's lab work-up is unremarkable.  His tremors have significantly decreased and vital signs have improved after Ativan.  He reports no significant change in the neuropathy symptoms.  As the patient is too weak and cannot ambulate, he is not appropriate for discharge home.  Although his vital signs are not significantly abnormal, he appears to be in active alcohol withdrawal and will require admission for inpatient management.  ____________________________________________   FINAL CLINICAL IMPRESSION(S) / ED DIAGNOSES  Final diagnoses:  Alcohol withdrawal syndrome without complication (HCC)  Peripheral polyneuropathy      NEW MEDICATIONS STARTED DURING THIS VISIT:  New Prescriptions   No medications on file     Note:  This document was prepared using  Dragon voice recognition software and may include unintentional dictation errors.    Dionne BucySiadecki, Reg Bircher, MD 08/24/17 1441

## 2017-08-24 NOTE — ED Notes (Signed)
First Nurse Note:  Patient requesting water, advised NPO until seen.

## 2017-08-24 NOTE — H&P (Addendum)
[facility] [place] [facility]   PATIENT NAME: Kurt Horton    MR#:  [id number]  DATE OF BIRTH:  [birth year]  DATE OF ADMISSION:  08/24/2017  PRIMARY CARE PHYSICIAN: Barbette ReichmannHande, Vishwanath, MD   REQUESTING/REFERRING PHYSICIAN: Dr. Dionne BucySebastian Siadecki  CHIEF COMPLAINT:   Chief Complaint  Patient presents with  . Peripheral Neuropathy  . Delirium Tremens (DTS)    HISTORY OF PRESENT ILLNESS:  Kurt SimmerJohn Braddock  is a 57 y.o. male with a known history of alcohol abuse and multiple admissions to different facilities for alcohol withdrawal, paroxysmal atrial fibrillation, prediabetes, hypertension, myasthenia gravis and lower back pain presents to hospital secondary to worsening neuropathy symptoms in his feet and also tremors. Patient was just discharged from the hospital less than a week ago.  He was here for alcohol withdrawal and also new onset A. fib at the time.  He is converted into normal sinus rhythm during that admission.  Discharged on amiodarone.  He states he has been drinking due to stress lately.  His last drink was yesterday.  He drinks mostly from, at least a pint every day.  He has lower back issues from facet arthritis.  Seen a neurosurgeon, they have recommended spinal injections.  His last injection was about 2 weeks ago.  His back pain has worsened, his neuropathy and burning sensation in his feet has worsened to the point that he is unable to walk.  But on exam his strength is normal when lying in bed.  He is being admitted for alcohol withdrawal again.  PAST MEDICAL HISTORY:   Past Medical History:  Diagnosis Date  . A-fib (HCC)   . Alcohol abuse   . Alcohol withdrawal (HCC) 11/12/2016  . Diabetes mellitus without complication (HCC)   . DJD (degenerative joint disease)   . Hypertension   . Myasthenia gravis (HCC)   . Renal disorder     PAST SURGICAL HISTORY:   Past Surgical History:  Procedure Laterality Date  . AMPUTATION TOE Left 12/29/2015   Procedure: AMPUTATION TOE;  Surgeon: Gwyneth RevelsJustin Fowler, DPM;  Location: [facility] ORS;  Service: Podiatry;  Laterality: Left;  . PERIPHERAL VASCULAR CATHETERIZATION N/A 12/27/2015   Procedure: Lower Extremity Angiography;  Surgeon: Annice NeedyJason S Dew, MD;  Location: [facility] INVASIVE CV LAB;  Service: Cardiovascular;  Laterality: N/A;  . right hip surgery    . right shoulder surgery      SOCIAL HISTORY:   Social History   Tobacco Use  . Smoking status: Current Every Day Smoker    Packs/day: 0.50    Types: Cigarettes  . Smokeless tobacco: Never Used  Substance Use Topics  . Alcohol use: Yes    Comment: Pt had tequila and rum, at least a 5th or a pint    FAMILY HISTORY:   Family History  Problem Relation Age of Onset  . Rheum arthritis Father     DRUG ALLERGIES:   Allergies  Allergen Reactions  . Other Other (See Comments)    Patient states he can't take any "mycin" medications because myasthenia gravis.    REVIEW OF SYSTEMS:   Review of Systems  Constitutional: Positive for malaise/fatigue. Negative for chills, fever and weight loss.  HENT: Negative for ear discharge, ear pain, hearing loss and nosebleeds.   Eyes: Negative for blurred vision, double vision and photophobia.  Respiratory: Negative for cough, hemoptysis, shortness of breath and wheezing.   Cardiovascular: Negative for chest pain, palpitations, orthopnea and leg swelling.  Gastrointestinal: Negative for abdominal  pain, constipation, diarrhea, melena, nausea and vomiting.  Genitourinary: Negative for dysuria, frequency, hematuria and urgency.  Musculoskeletal: Positive for joint pain and myalgias. Negative for back pain and neck pain.  Skin: Negative for rash.  Neurological: Positive for tremors. Negative for dizziness, tingling, sensory change, speech change, focal weakness and headaches.  Endo/Heme/Allergies: Does not bruise/bleed easily.  Psychiatric/Behavioral: Negative for depression.    MEDICATIONS AT HOME:   Prior  to Admission medications   Medication Sig Start Date End Date Taking? Authorizing Provider  amiodarone (PACERONE) 400 MG tablet Take 1 tablet (400 mg total) by mouth 2 (two) times daily for 7 days. 08/17/17 08/24/17 Yes Auburn Bilberry, MD  aspirin EC 325 MG EC tablet Take 1 tablet (325 mg total) by mouth daily. 08/18/17  Yes Auburn Bilberry, MD  B Complex-C (B-COMPLEX WITH VITAMIN C) tablet Take 1 tablet by mouth daily.   Yes [provider]  chlordiazePOXIDE (LIBRIUM) 5 MG capsule Take 1 capsule (5 mg total) by mouth 3 (three) times daily as needed for anxiety. 08/17/17  Yes Auburn Bilberry, MD  citalopram (CELEXA) 20 MG tablet Take 1 tablet (20 mg total) by mouth daily. 08/24/16  Yes Sater, Pearletha Furl, MD  furosemide (LASIX) 40 MG tablet Take 40 mg by mouth daily as needed for fluid (if taking prednisone).    Yes [provider]  gabapentin (NEURONTIN) 300 MG capsule Take 1 capsule (300 mg total) by mouth 3 (three) times daily. Patient taking differently: Take 600 mg 3 (three) times daily by mouth.  03/01/17  Yes Enid Baas, MD  metFORMIN (GLUCOPHAGE) 500 MG tablet Take 1 tablet (500 mg total) by mouth daily. 12/30/15  Yes Wieting, Richard, MD  metoprolol succinate (TOPROL-XL) 50 MG 24 hr tablet Take 1 tablet (50 mg total) by mouth daily. Take with or immediately following a meal. 08/18/17  Yes Auburn Bilberry, MD  multivitamin (ONE-A-DAY MEN'S) TABS tablet Take 1 tablet by mouth daily. 08/18/17  Yes Wieting, Richard, MD  mycophenolate (CELLCEPT) 500 MG tablet Take 1 tablet (500MG ) by mouth every morning and 2 tablets (1000MG ) by mouth every evening   Yes [provider]  potassium chloride (K-DUR) 10 MEQ tablet Take 10 mEq by mouth daily. 06/21/17 06/21/18 Yes [provider]  amiodarone (PACERONE) 200 MG tablet Take 1 tablet (200 mg total) by mouth daily. Patient not taking: Reported on 08/24/2017 08/25/17   Auburn Bilberry, MD      VITAL SIGNS:  Blood  pressure (!) 147/84, pulse 82, temperature 99 F (37.2 C), temperature source Oral, resp. rate 18, height 5\' 9"  (1.753 m), weight 80.7 kg (178 lb), SpO2 97 %.  PHYSICAL EXAMINATION:   Physical Exam  GENERAL:  57 y.o.-year-old patient lying in the bed with no acute distress.  EYES: Pupils equal, round, reactive to light and accommodation. No scleral icterus. Extraocular muscles intact.  HEENT: Head atraumatic, normocephalic. Oropharynx and nasopharynx clear.  NECK:  Supple, no jugular venous distention. No thyroid enlargement, no tenderness.  LUNGS: Normal breath sounds bilaterally, no wheezing, rales,rhonchi or crepitation. No use of accessory muscles of respiration. Decreased bibasilar breath sounds CARDIOVASCULAR: S1, S2 normal. No murmurs, rubs, or gallops.  ABDOMEN: Soft, nontender, nondistended. Bowel sounds present. No organomegaly or mass.  EXTREMITIES: No pedal edema, cyanosis, or clubbing. Hand tremors Lower back pain noted. NEUROLOGIC: Cranial nerves II through XII are intact. Muscle strength 5/5 in all extremities. Sensation intact except in both feet. Gait not checked.  PSYCHIATRIC: The patient is alert and oriented  x 3.  SKIN: No obvious rash, lesion, or ulcer.   LABORATORY PANEL:   CBC Recent Labs  Lab 08/24/17 1204  WBC 6.1  HGB 12.2*  HCT 37.9*  PLT 395   ------------------------------------------------------------------------------------------------------------------  Chemistries  Recent Labs  Lab 08/24/17 1204  NA 138  K 3.0*  CL 97*  CO2 27  GLUCOSE 108*  BUN 13  CREATININE 0.94  CALCIUM 9.3   ------------------------------------------------------------------------------------------------------------------  Cardiac Enzymes No results for input(s): TROPONINI in the last 168 hours. ------------------------------------------------------------------------------------------------------------------  RADIOLOGY:  No results found.  EKG:   Orders  placed or performed during the hospital encounter of 08/24/17  . ED EKG  . ED EKG  . EKG 12-Lead  . EKG 12-Lead    IMPRESSION AND PLAN:   Laine Giovanetti  is a 57 y.o. male with a known history of alcohol abuse and multiple admissions to different facilities for alcohol withdrawal, paroxysmal atrial fibrillation, prediabetes, hypertension, myasthenia gravis and lower back pain presents to hospital secondary to worsening neuropathy symptoms in his feet and also tremors.  1. Alcohol abuse and alcohol withdrawal- admit, CIWA protocol - counseled, but frequent admissions for the same diagnosis - drinks a lot  2. Peripheral neuropathy- increase gabapentin and monitor  3.  Paroxysmal atrial fibrillation-replace electrolytes.  Currently in sinus rhythm. -Continue amiodarone and metoprolol -Aspirin only for anticoagulation is not a good candidate due to his alcohol use and falls  4.  Lumbar facet arthropathy-following up with physiatrist -Back injections.  Continue pain medications as needed  5.  Diabetes mellitus-on low-dose metformin  6.  DVT prophylaxis-Lovenox  7. Hypokalemia- being replaced, check mag level  Physical therapy consulted   All the records are reviewed and case discussed with ED provider. Management plans discussed with the patient, family and they are in agreement.  CODE STATUS: Full Code  TOTAL TIME TAKING CARE OF THIS PATIENT: 50 minutes.    Enid Baas M.D on 08/24/2017 at 3:24 PM  Between 7am to 6pm - Pager - [phone]  After 6pm go to [email or web address] - Social research officer, government  [facility]  Office  [phone]  CC: Primary care physician; [name], MD

## 2017-08-24 NOTE — ED Notes (Signed)
Pt gave this RN his pocket knife, a sticker with pt's name placed on it and left for the officer

## 2017-08-24 NOTE — ED Notes (Signed)
ED Provider at bedside. 

## 2017-08-24 NOTE — ED Triage Notes (Signed)
Pt arrives via ems, pt states that within 30 min of waking up both feet were numb, states hx of neuropathy but never this bad, pt shaking all over and difficult time noted removing his jacket, pt disheveled in appearance, pt asked about the tremors and states he is in dt's, that his last drink was yesterday and by this time of day he would have already started drinking

## 2017-08-24 NOTE — Progress Notes (Signed)
Chaplain educated patient on AD and remained to provide active listening, presence, and mutual sharing about alcohol addiction. Chaplain offered personal experiences, strength, and hope to encourage the patient along the path to sobriety. Chaplain will follow-up as requested.

## 2017-08-24 NOTE — ED Notes (Signed)
First Nurse Note:  Patient's Father asks to be called if patient is discharged.  Homero FellersFrank at [phone].

## 2017-08-24 NOTE — ED Notes (Signed)
First Nurse Note:  [facility] brings patient in with neuropathy, able to walk to front door for EMS. HX of alcohol abuse per EMS.  VSS at scene, BS - 161.  Sitting in WC in [place].

## 2017-08-25 LAB — BASIC METABOLIC PANEL
Anion gap: 7 (ref 5–15)
BUN: 18 mg/dL (ref 6–20)
CHLORIDE: 103 mmol/L (ref 101–111)
CO2: 27 mmol/L (ref 22–32)
CREATININE: 0.95 mg/dL (ref 0.61–1.24)
Calcium: 8.6 mg/dL — ABNORMAL LOW (ref 8.9–10.3)
GFR calc Af Amer: >60 mL/min (ref >60)
GFR calc non Af Amer: >60 mL/min (ref >60)
Glucose, Bld: 122 mg/dL — ABNORMAL HIGH (ref 65–99)
POTASSIUM: 3.4 mmol/L — AB (ref 3.5–5.1)
Sodium: 137 mmol/L (ref 135–145)

## 2017-08-25 LAB — CBC
HCT: 34.3 % — ABNORMAL LOW (ref 40.0–52.0)
Hemoglobin: 10.9 g/dL — ABNORMAL LOW (ref 13.0–18.0)
MCH: 24.7 pg — ABNORMAL LOW (ref 26.0–34.0)
MCHC: 31.8 g/dL — ABNORMAL LOW (ref 32.0–36.0)
MCV: 77.7 fL — AB (ref 80.0–100.0)
PLATELETS: 311 10*3/uL (ref 150–440)
RBC: 4.41 MIL/uL (ref 4.40–5.90)
RDW: 24.7 % — AB (ref 11.5–14.5)
WBC: 4.7 10*3/uL (ref 3.8–10.6)

## 2017-08-25 LAB — MAGNESIUM: Magnesium: 1.4 mg/dL — ABNORMAL LOW (ref 1.7–2.4)

## 2017-08-25 MED ORDER — POTASSIUM CHLORIDE CRYS ER 20 MEQ PO TBCR
20.0000 meq | EXTENDED_RELEASE_TABLET | Freq: Once | ORAL | Status: AC
  Administered 2017-08-25: 12:00:00 20 meq via ORAL
  Filled 2017-08-25: qty 1

## 2017-08-25 NOTE — Progress Notes (Signed)
[facility] - [place] [facility]   PATIENT NAME: Kurt Horton Horton    MR#:  [id number]  DATE OF BIRTH:  12-28-1960  SUBJECTIVE:  CHIEF COMPLAINT:   Chief Complaint  Patient presents with  . Peripheral Neuropathy  . Delirium Tremens (DTS)  Patient seen and evaluated today Has tremors in the upper extremities No chest pain No shortness of breath  REVIEW OF SYSTEMS:    ROS  CONSTITUTIONAL: No documented fever. Has fatigue, weakness. No weight gain, no weight loss.  EYES: No blurry or double vision.  ENT: No tinnitus. No postnasal drip. No redness of the oropharynx.  RESPIRATORY: No cough, no wheeze, no hemoptysis. No dyspnea.  CARDIOVASCULAR: No chest pain. No orthopnea. No palpitations. No syncope.  GASTROINTESTINAL: No nausea, no vomiting or diarrhea. No abdominal pain. No melena or hematochezia.  GENITOURINARY: No dysuria or hematuria.  ENDOCRINE: No polyuria or nocturia. No heat or cold intolerance.  HEMATOLOGY: No anemia. No bruising. No bleeding.  INTEGUMENTARY: No rashes. No lesions.  MUSCULOSKELETAL: No arthritis. No swelling. No gout.  NEUROLOGIC: No numbness, tingling, or ataxia. No seizure-type activity.  Tremors upper extremities PSYCHIATRIC: No anxiety. No insomnia. No ADD.   DRUG ALLERGIES:   Allergies  Allergen Reactions  . Other Other (See Comments)    Patient states he can't take any "mycin" medications because myasthenia gravis.    VITALS:  Blood pressure 121/78, pulse 80, temperature (!) 97.3 F (36.3 C), temperature source Oral, resp. rate 18, height 5\' 10"  (1.778 m), weight 79.1 kg (174 lb 7 oz), SpO2 100 %.  PHYSICAL EXAMINATION:   Physical Exam  GENERAL:  57 y.o.-year-old patient lying in the bed with no acute distress.  EYES: Pupils equal, round, reactive to light and accommodation. No scleral icterus. Extraocular muscles intact.  HEENT: Head atraumatic, normocephalic. Oropharynx and nasopharynx clear.  NECK:  Supple, no  jugular venous distention. No thyroid enlargement, no tenderness.  LUNGS: Normal breath sounds bilaterally, no wheezing, rales, rhonchi. No use of accessory muscles of respiration.  CARDIOVASCULAR: S1, S2 normal. No murmurs, rubs, or gallops.  ABDOMEN: Soft, nontender, nondistended. Bowel sounds present. No organomegaly or mass.  EXTREMITIES: No cyanosis, clubbing or edema b/l.    Tremors NEUROLOGIC: Cranial nerves II through XII are intact. No focal Motor or sensory deficits b/l.   PSYCHIATRIC: The patient is alert and oriented x 3.  SKIN: No obvious rash, lesion, or ulcer.   LABORATORY PANEL:   CBC Recent Labs  Lab 08/25/17 0456  WBC 4.7  HGB 10.9*  HCT 34.3*  PLT 311   ------------------------------------------------------------------------------------------------------------------ Chemistries  Recent Labs  Lab 08/24/17 1204 08/25/17 0456  NA 138 137  K 3.0* 3.4*  CL 97* 103  CO2 27 27  GLUCOSE 108* 122*  BUN 13 18  CREATININE 0.94 0.95  CALCIUM 9.3 8.6*  MG 1.3*  --    ------------------------------------------------------------------------------------------------------------------  Cardiac Enzymes No results for input(s): TROPONINI in the last 168 hours. ------------------------------------------------------------------------------------------------------------------  RADIOLOGY:  No results found.   ASSESSMENT AND PLAN:  Kurt Horton  is a 57 y.o. Horton with a known history of alcohol abuse and multiple admissions to different facilities for alcohol withdrawal, paroxysmal atrial fibrillation, prediabetes, hypertension, myasthenia gravis and lower back pain presents to hospital secondary to worsening neuropathy symptoms in his feet and also tremors.  1. Alcohol abuse and alcohol withdrawal- continue CIWA protocol - counseled again  2. Peripheral neuropathy- increase gabapentin and monitor  3.  Paroxysmal atrial fibrillation-replace electrolytes.  Currently  in sinus rhythm. -Continue amiodarone and metoprolol -Aspirin only for anticoagulation is not a good candidate due to his alcohol use and falls  4.  Lumbar facet arthropathy-following up with physiatrist -Back injections.  Continue pain medications as needed  5.  Diabetes mellitus-on low-dose metformin  6.  DVT prophylaxis-Lovenox  7. Hypokalemia- being replaced, check mag level  Physical therapy consulted     All the records are reviewed and case discussed with Care Management/Social Worker. Management plans discussed with the patient, family and they are in agreement.  CODE STATUS: Full code  DVT Prophylaxis: SCDs  TOTAL TIME TAKING CARE OF THIS PATIENT: 35 minutes.   POSSIBLE D/C IN 1 to 2 DAYS, DEPENDING ON CLINICAL CONDITION.  Ihor Austin M.D on 08/25/2017 at 10:58 AM  Between 7am to 6pm - Pager - [phone]  After 6pm go to [email or web address] - password [email or web address] [facility]  [facility]  Office  [phone]  CC: Primary care physician; [name], MD  Note: This dictation was prepared with Dragon dictation along with smaller phrase technology. Any transcriptional errors that result from this process are unintentional.

## 2017-08-25 NOTE — Evaluation (Signed)
Physical Therapy Evaluation Patient Details Name: Kurt PhenesJohn D Horton MRN: [id number] DOB: 04/22/1961 Today's Date: 08/25/2017   History of Present Illness  Pt is a 57 y/o M with a known history of alcohol abuse and multiple admissions to different facilities for alcohol withdrawal, paroxysmal atrial fibrillation, prediabetes, hypertension, myasthenia gravis and lower back pain presents to hospital secondary to worsening neuropathy symptoms in his feet and also tremors. Pt with CIWA protocol in hospital.     Clinical Impression  Pt admitted with above diagnosis. Pt currently with functional limitations due to the deficits listed below (see PT Problem List). [name] presents with instability with transfers and ambulation due to neuropathy BLEs.  Balance improved significantly when RW introduced as compared to [facility] and pt instructed to use RW exclusively moving forward.   Pt will benefit from skilled PT to increase their independence and safety with mobility to allow discharge to the venue listed below.      Follow Up Recommendations Outpatient PT    Equipment Recommendations  None recommended by PT(Pt reports he has RW at home)    Recommendations for Other Services       Precautions / Restrictions Precautions Precautions: Fall;Other (comment) Precaution Comments: Monitor HR Restrictions Weight Bearing Restrictions: No      Mobility  Bed Mobility Overal bed mobility: Modified Independent             General bed mobility comments: Increased time but no physical assist or cues needed  Transfers Overall transfer level: Needs assistance Equipment used: Straight cane;Rolling walker (2 wheeled) Transfers: Sit to/from Stand Sit to Stand: Min guard         General transfer comment: Close min guard assist for sit>stand from bed using SPC as pt demonstrates moderate unsteadiness.  Improved stability with introduction of RW but does require cues for safe mangement of RW with stand>sit.    Ambulation/Gait Ambulation/Gait assistance: Min guard Ambulation Distance (Feet): 160 Feet Assistive device: Rolling walker (2 wheeled) Gait Pattern/deviations: Step-through pattern;Decreased step length - left;Decreased step length - right;Antalgic;Trunk flexed Gait velocity: mildly decreased   General Gait Details: Pt requires cues for upright posture and forward gaze which is challenging to maintain due to chronic back pain.  Pt demonstrates moderate instability ambulating 10 ft with SPC with improved stability with introduction of RW (pt to use RW exclusively moving forward).  Cues to keep RW closer.    Stairs            Wheelchair Mobility    Modified Rankin (Stroke Patients Only)       Balance Overall balance assessment: Needs assistance Sitting-balance support: No upper extremity supported;Feet supported Sitting balance-Leahy Scale: Good     Standing balance support: Single extremity supported;During functional activity Standing balance-Leahy Scale: Fair                               Pertinent Vitals/Pain Pain Assessment: 0-10 Pain Score: 9  Pain Location: back, BLEs Pain Descriptors / Indicators: Aching;Moaning Pain Intervention(s): Limited activity within patient's tolerance;Monitored during session;Repositioned;Patient requesting pain meds-RN notified    Home Living Family/patient expects to be discharged to:: Private residence Living Arrangements: Parent Available Help at Discharge: Family Type of Home: House Home Access: Level entry     Home Layout: One level Home Equipment: Cane - single point;Insurance underwriterlectric scooter;Walker - 2 wheels      Prior Function Level of Independence: Needs assistance   Gait / Transfers  Assistance Needed: Pt ambulating with SPC mainly household distances with several falls over the past 6 months.    ADL's / Homemaking Assistance Needed: Pt not driving due to neuropathy.  Pt taking bath as he is concerned about  falling when showering.  Pt's family and friends assist with meal prep.          Hand Dominance        Extremity/Trunk Assessment   Upper Extremity Assessment Upper Extremity Assessment: (BUE strength grossly 4+/5)    Lower Extremity Assessment Lower Extremity Assessment: (BLE strength grossly 4/5)    Cervical / Trunk Assessment Cervical / Trunk Assessment: Kyphotic;Other exceptions Cervical / Trunk Exceptions: h/o LBP (arthritis per chart)  Communication   Communication: No difficulties  Cognition Arousal/Alertness: Awake/alert Behavior During Therapy: WFL for tasks assessed/performed Overall Cognitive Status: Within Functional Limits for tasks assessed                                        General Comments General comments (skin integrity, edema, etc.): HR appropriate throughout session with and without activity    Exercises     Assessment/Plan    PT Assessment Patient needs continued PT services  PT Problem List Decreased strength;Decreased activity tolerance;Decreased balance;Decreased knowledge of use of DME;Decreased safety awareness;Pain       PT Treatment Interventions DME instruction;Gait training;Stair training;Functional mobility training;Therapeutic activities;Therapeutic exercise;Balance training;Neuromuscular re-education;Patient/family education    PT Goals (Current goals can be found in the Care Plan section)  Acute Rehab PT Goals Patient Stated Goal: to improve his balance PT Goal Formulation: With patient Time For Goal Achievement: 09/08/17 Potential to Achieve Goals: Good    Frequency Min 2X/week   Barriers to discharge        Co-evaluation               AM-PAC PT "6 Clicks" Daily Activity  Outcome Measure Difficulty turning over in bed (including adjusting bedclothes, sheets and blankets)?: None Difficulty moving from lying on back to sitting on the side of the bed? : A Little Difficulty sitting down on and  standing up from a chair with arms (e.g., wheelchair, bedside commode, etc,.)?: A Little Help needed moving to and from a bed to chair (including a wheelchair)?: A Little Help needed walking in hospital room?: A Little Help needed climbing 3-5 steps with a railing? : A Little 6 Click Score: 19    End of Session Equipment Utilized During Treatment: Gait belt Activity Tolerance: Patient tolerated treatment well Patient left: in chair;with call bell/phone within reach;with chair alarm set Nurse Communication: Mobility status PT Visit Diagnosis: Unsteadiness on feet (R26.81);Other abnormalities of gait and mobility (R26.89);Pain Pain - Right/Left: (BLE) Pain - part of body: Leg    Time: 0942-1000 PT Time Calculation (min) (ACUTE ONLY): 18 min   Charges:   PT Evaluation $PT Eval Low Complexity: 1 Low     PT G Codes:       Encarnacion Chu PT, DPT 08/25/2017, 11:20 AM

## 2017-08-25 NOTE — Progress Notes (Signed)
Brief conversation with nurse about the patient. He is "drug-seeking" but doing well with withdrawal symptoms. Chaplain spoke with patient and he would like to complete the AD. Chaplain asked patient to wait until mid-morning to page for assistance completing the AD. Chaplain continued to provide encouragement during the patient's early sobriety.

## 2017-08-26 LAB — GLUCOSE, CAPILLARY
GLUCOSE-CAPILLARY: 101 mg/dL — AB (ref 65–99)
GLUCOSE-CAPILLARY: 121 mg/dL — AB (ref 65–99)
Glucose-Capillary: 119 mg/dL — ABNORMAL HIGH (ref 65–99)

## 2017-08-26 MED ORDER — INSULIN ASPART 100 UNIT/ML ~~LOC~~ SOLN
0.0000 [IU] | Freq: Three times a day (TID) | SUBCUTANEOUS | Status: DC
  Administered 2017-08-27: 2 [IU] via SUBCUTANEOUS
  Filled 2017-08-26: qty 1

## 2017-08-26 MED ORDER — INSULIN ASPART 100 UNIT/ML ~~LOC~~ SOLN: 0.0000 [IU] | Freq: Every day | SUBCUTANEOUS | Status: DC

## 2017-08-26 NOTE — Progress Notes (Signed)
[facility] - [place] at [facility]   PATIENT NAME: Kurt Horton    MR#:  [id number]  DATE OF BIRTH:  11-16-1960  SUBJECTIVE:  CHIEF COMPLAINT:   Chief Complaint  Patient presents with  . Peripheral Neuropathy  . Delirium Tremens (DTS)  Patient seen and evaluated today Improved tremors in the upper extremities Has neuropathic pains in lower extremities Says he has difficulty in balance secondary to neuropathic pains in the lower extremity No chest pain No shortness of breath  REVIEW OF SYSTEMS:    ROS  CONSTITUTIONAL: No documented fever. Has fatigue, weakness. No weight gain, no weight loss.  EYES: No blurry or double vision.  ENT: No tinnitus. No postnasal drip. No redness of the oropharynx.  RESPIRATORY: No cough, no wheeze, no hemoptysis. No dyspnea.  CARDIOVASCULAR: No chest pain. No orthopnea. No palpitations. No syncope.  GASTROINTESTINAL: No nausea, no vomiting or diarrhea. No abdominal pain. No melena or hematochezia.  GENITOURINARY: No dysuria or hematuria.  ENDOCRINE: No polyuria or nocturia. No heat or cold intolerance.  HEMATOLOGY: No anemia. No bruising. No bleeding.  INTEGUMENTARY: No rashes. No lesions.  MUSCULOSKELETAL: No arthritis. No swelling. No gout.  NEUROLOGIC: No numbness, tingling, or ataxia. No seizure-type activity.  Tremors upper extremities PSYCHIATRIC: No anxiety. No insomnia. No ADD.   DRUG ALLERGIES:   Allergies  Allergen Reactions  . Other Other (See Comments)    Patient states he can't take any "mycin" medications because myasthenia gravis.    VITALS:  Blood pressure 119/74, pulse 77, temperature 98.6 F (37 C), temperature source Oral, resp. rate 18, height 5\' 10"  (1.778 m), weight 79.1 kg (174 lb 7 oz), SpO2 99 %.  PHYSICAL EXAMINATION:   Physical Exam  GENERAL:  57 y.o.-year-old patient lying in the bed with no acute distress.  EYES: Pupils equal, round, reactive to light and accommodation. No scleral  icterus. Extraocular muscles intact.  HEENT: Head atraumatic, normocephalic. Oropharynx and nasopharynx clear.  NECK:  Supple, no jugular venous distention. No thyroid enlargement, no tenderness.  LUNGS: Normal breath sounds bilaterally, no wheezing, rales, rhonchi. No use of accessory muscles of respiration.  CARDIOVASCULAR: S1, S2 normal. No murmurs, rubs, or gallops.  ABDOMEN: Soft, nontender, nondistended. Bowel sounds present. No organomegaly or mass.  EXTREMITIES: No cyanosis, clubbing or edema b/l.    Tremors NEUROLOGIC: Cranial nerves II through XII are intact. No focal Motor or sensory deficits b/l.   PSYCHIATRIC: The patient is alert and oriented x 3.  SKIN: No obvious rash, lesion, or ulcer.   LABORATORY PANEL:   CBC Recent Labs  Lab 08/25/17 0456  WBC 4.7  HGB 10.9*  HCT 34.3*  PLT 311   ------------------------------------------------------------------------------------------------------------------ Chemistries  Recent Labs  Lab 08/25/17 0456  NA 137  K 3.4*  CL 103  CO2 27  GLUCOSE 122*  BUN 18  CREATININE 0.95  CALCIUM 8.6*  MG 1.4*   ------------------------------------------------------------------------------------------------------------------  Cardiac Enzymes No results for input(s): TROPONINI in the last 168 hours. ------------------------------------------------------------------------------------------------------------------  RADIOLOGY:  No results found.   ASSESSMENT AND PLAN:  Kurt Horton  is a 57 y.o. male with a known history of alcohol abuse and multiple admissions to different facilities for alcohol withdrawal, paroxysmal atrial fibrillation, prediabetes, hypertension, myasthenia gravis and lower back pain presents to hospital secondary to worsening neuropathy symptoms in his feet and also tremors.  1. Alcohol abuse and alcohol withdrawal- continue CIWA protocol - counseled again  2. Peripheral neuropathy-continue gabapentin and  monitor  3.  Paroxysmal atrial fibrillation-replace electrolytes.  Currently in sinus rhythm. -Continue amiodarone and metoprolol -Aspirin only for anticoagulation is not a good candidate due to his alcohol use and falls  4.  Lumbar facet arthropathy-following up with physiatrist -Back injections.  Continue pain medications as needed  5.  Diabetes mellitus-on low-dose metformin  6.  DVT prophylaxis-Lovenox  7. Hypokalemia- being replaced, check mag level       All the records are reviewed and case discussed with Care Management/Social Worker. Management plans discussed with the patient, family and they are in agreement.  CODE STATUS: Full code  DVT Prophylaxis: SCDs  TOTAL TIME TAKING CARE OF THIS PATIENT: 35 minutes.   POSSIBLE D/C IN 1 day, DEPENDING ON CLINICAL CONDITION.  Ihor Austin M.D on 08/26/2017 at 11:47 AM  Between 7am to 6pm - Pager - [phone]  After 6pm go to [email or web address] - password [email or web address] [facility]  [facility] [place] [facility]  Office  [phone]  CC: Primary care physician; [name], MD  Note: This dictation was prepared with Dragon dictation along with smaller phrase technology. Any transcriptional errors that result from this process are unintentional.

## 2017-08-27 LAB — GLUCOSE, CAPILLARY
GLUCOSE-CAPILLARY: 128 mg/dL — AB (ref 65–99)
Glucose-Capillary: 104 mg/dL — ABNORMAL HIGH (ref 65–99)

## 2017-08-27 NOTE — Progress Notes (Signed)
   08/27/17 1300  Clinical Encounter Type  Visited With Patient  Visit Type Follow-up  Referral From Nurse  Consult/Referral To Chaplain  Spiritual Encounters  Spiritual Needs Other (Comment)   CH received a PG to report to PT's RM to complete an AD. CH was unable to secure a notary so CH took PT to admin to get AD completed. AD was completed and original was given to PT and copy was put into file.

## 2017-08-27 NOTE — Care Management Note (Signed)
Case Management Note  Patient Details  Name: Kurt PhenesJohn D Horton MRN: [id number] Date of Birth: Mar 26, 1961  Subjective/Objective:   Admitted to [facility] with the diagnosis of alcohol withdrawal. Lives alone.Last seen Dr. Marcello FennelHande a couple of months ago. Prescriptions  are filled at [facility] or [facility] in [place], Family or [facility] will transport.                  Action/Plan: Physical therapy evaluation completed. Recommending Outpatient therapy. [name] is in agreement Will fax to rehab after getting Dr. Mosetta PuttPyreddy's signature,   Expected Discharge Date:  08/27/17               Expected Discharge Plan:     In-House Referral:     Discharge planning Services     Post Acute Care Choice:    Choice offered to:     DME Arranged:    DME Agency:     HH Arranged:    HH Agency:     Status of Service:     If discussed at [employer] Meetings, dates discussed:    Additional Comments:  Gwenette GreetBrenda S Havyn Ramo, RN MSN CCM Care Management [phone] 08/27/2017, 10:33 AM

## 2017-08-27 NOTE — Discharge Summary (Addendum)
[facility] - [place] at [facility]   PATIENT NAME: Kurt Horton    MR#:  [id number]  DATE OF BIRTH:  01/13/1961  DATE OF ADMISSION:  08/24/2017 ADMITTING PHYSICIAN: Enid Baasadhika Kalisetti, MD  DATE OF DISCHARGE: 08/27/2017  PRIMARY CARE PHYSICIAN: Barbette ReichmannHande, Vishwanath, MD   ADMISSION DIAGNOSIS:  Alcohol abuse and alcohol withdrawal Peripheral neuropathy Paroxysmal atrial fibrillation Lumbar facet arthropathy Type 2 diabetes mellitus Hypokalemia  DISCHARGE DIAGNOSIS:  Peripheral neuropathy Proximal atrial fibrillation Type 2 diabetes mellitus Hypokalemia resolved Alcohol withdrawal resolved  SECONDARY DIAGNOSIS:   Past Medical History:  Diagnosis Date  . A-fib (HCC)   . Alcohol abuse   . Alcohol withdrawal (HCC) 11/12/2016  . Diabetes mellitus without complication (HCC)   . DJD (degenerative joint disease)   . Hypertension   . Myasthenia gravis (HCC)   . Renal disorder      ADMITTING HISTORY Kurt Horton  is a 57 y.o. male with a known history of alcohol abuse and multiple admissions to different facilities for alcohol withdrawal, paroxysmal atrial fibrillation, prediabetes, hypertension, myasthenia gravis and lower back pain presents to hospital secondary to worsening neuropathy symptoms in his feet and also tremors.Patient was just discharged from the hospital less than a week ago.  He was here for alcohol withdrawal and also new onset A. fib at the time.  He is converted into normal sinus rhythm during that admission.  Discharged on amiodarone.  He states he has been drinking due to stress lately.  His last drink was yesterday.  He drinks mostly from, at least a pint every day.  He has lower back issues from facet arthritis.  Seen a neurosurgeon, they have recommended spinal injections.  His last injection was about 2 weeks ago.  His back pain has worsened, his neuropathy and burning sensation in his feet has worsened to the point that he is unable to walk.  But on  exam his strength is normal when lying in bed.  He is being admitted for alcohol withdrawal again.  HOSPITAL COURSE:  Patient was admitted to medical floor.  He was put on Ciwa protocol for alcohol withdrawal patient received oral gabapentin for peripheral neuropathy.  His potassium and magnesium were monitored.  Potassium was replaced actively.  Patient continued oral amiodarone and metoprolol for rate control for atrial fibrillation.  DVT prophylaxis was given with subcu Lovenox daily.  Patient received physical therapy during the stay in the hospital.  Patient hemodynamically stable will be discharged home with home physical therapy services.  CONSULTS OBTAINED:  None  DRUG ALLERGIES:   Allergies  Allergen Reactions  . Other Other (See Comments)    Patient states he can't take any "mycin" medications because myasthenia gravis.    DISCHARGE MEDICATIONS:   Allergies as of 08/27/2017      Reactions   Other Other (See Comments)   Patient states he can't take any "mycin" medications because myasthenia gravis.      Medication List    TAKE these medications   amiodarone 200 MG tablet Commonly known as:  PACERONE Take 1 tablet (200 mg total) by mouth daily. What changed:  Another medication with the same name was removed. Continue taking this medication, and follow the directions you see here.   aspirin 325 MG EC tablet Take 1 tablet (325 mg total) by mouth daily.   B-complex with vitamin C tablet Take 1 tablet by mouth daily.   chlordiazePOXIDE 5 MG capsule Commonly known as:  LIBRIUM Take 1 capsule (5  mg total) by mouth 3 (three) times daily as needed for anxiety.   citalopram 20 MG tablet Commonly known as:  CELEXA Take 1 tablet (20 mg total) by mouth daily.   furosemide 40 MG tablet Commonly known as:  LASIX Take 40 mg by mouth daily as needed for fluid (if taking prednisone).   gabapentin 300 MG capsule Commonly known as:  NEURONTIN Take 1 capsule (300 mg total) by  mouth 3 (three) times daily. What changed:  how much to take   metFORMIN 500 MG tablet Commonly known as:  GLUCOPHAGE Take 1 tablet (500 mg total) by mouth daily.   metoprolol succinate 50 MG 24 hr tablet Commonly known as:  TOPROL-XL Take 1 tablet (50 mg total) by mouth daily. Take with or immediately following a meal.   multivitamin Tabs tablet Take 1 tablet by mouth daily.   mycophenolate 500 MG tablet Commonly known as:  CELLCEPT Take 1 tablet (500MG ) by mouth every morning and 2 tablets (1000MG ) by mouth every evening   potassium chloride 10 MEQ tablet Commonly known as:  K-DUR Take 10 mEq by mouth daily.       Today  Patient seen and evaluated today Tolerating diet well No complaints VITAL SIGNS:  Blood pressure 115/83, pulse 61, temperature 98.5 F (36.9 C), temperature source Oral, resp. rate 18, height 5\' 10"  (1.778 m), weight 79.1 kg (174 lb 7 oz), SpO2 99 %.  I/O:    Intake/Output Summary (Last 24 hours) at 08/27/2017 1450 Last data filed at 08/27/2017 0953 Gross per 24 hour  Intake 240 ml  Output -  Net 240 ml    PHYSICAL EXAMINATION:  Physical Exam  GENERAL:  57 y.o.-year-old patient lying in the bed with no acute distress.  LUNGS: Normal breath sounds bilaterally, no wheezing, rales,rhonchi or crepitation. No use of accessory muscles of respiration.  CARDIOVASCULAR: S1, S2 normal. No murmurs, rubs, or gallops.  ABDOMEN: Soft, non-tender, non-distended. Bowel sounds present. No organomegaly or mass.  NEUROLOGIC: Moves all 4 extremities. PSYCHIATRIC: The patient is alert and oriented x 3.  SKIN: No obvious rash, lesion, or ulcer.   DATA REVIEW:   CBC Recent Labs  Lab 08/25/17 0456  WBC 4.7  HGB 10.9*  HCT 34.3*  PLT 311    Chemistries  Recent Labs  Lab 08/25/17 0456  NA 137  K 3.4*  CL 103  CO2 27  GLUCOSE 122*  BUN 18  CREATININE 0.95  CALCIUM 8.6*  MG 1.4*    Cardiac Enzymes No results for input(s): TROPONINI in the last  168 hours.  Microbiology Results  Results for orders placed or performed during the hospital encounter of 08/13/17  C difficile quick scan w PCR reflex     Status: None   Collection Time: 08/14/17  5:41 PM  Result Value Ref Range Status   C Diff antigen NEGATIVE NEGATIVE Final   C Diff toxin NEGATIVE NEGATIVE Final   C Diff interpretation No C. difficile detected.  Final    Comment: VALID Performed at [facility], [address] [place], [place] [address]     RADIOLOGY:  No results found.  Follow up with PCP in 1 week.  Management plans discussed with the patient, family and they are in agreement.  CODE STATUS: Full Code    Code Status Orders  (From admission, onward)        Start     Ordered   08/24/17 1756  Full code  Continuous  08/24/17 1755    Code Status History    Date Active Date Inactive Code Status Order ID Comments User Context   08/14/2017 0030 08/18/2017 1605 Full Code [id number]  Oralia Manis, MD Inpatient   06/04/2017 0116 06/06/2017 1701 Full Code [id number]  Oralia Manis, MD Inpatient   03/19/2017 1559 03/20/2017 2128 Full Code [id number]  Arnaldo Natal, MD ED   02/25/2017 1421 03/01/2017 1829 Full Code [id number]  Houston Siren, MD Inpatient   11/12/2016 1533 11/14/2016 1425 Full Code [id number]  Marguarite Arbour, MD Inpatient   12/26/2015 1927 12/30/2015 1604 Full Code [id number]  Marguarite Arbour, MD Inpatient      TOTAL TIME TAKING CARE OF THIS PATIENT ON DAY OF DISCHARGE: more than 33 minutes.   Ihor Austin M.D on 08/27/2017 at 2:50 PM  Between 7am to 6pm - Pager - [phone]  After 6pm go to [email or web address] - password [email or web address] [facility]  [facility] [place] [facility]  Office  [phone]  CC: Primary care physician; [name], MD  Note: This dictation was prepared with Dragon dictation along with smaller phrase technology. Any transcriptional errors that result from this process are unintentional.

## 2017-09-03 ENCOUNTER — Telehealth: Payer: Self-pay | Admitting: Licensed Clinical Social Worker

## 2017-09-03 NOTE — Telephone Encounter (Signed)
EMMI flagged patient for answering yes to loss of interest in things and yes to feeling sad/anxious/hopeless/empty. Clinical Child psychotherapist (CSW) attempted to contact patient via telephone however he did not answer and a voicemail was left at 5:22 pm.   [employer], LCSW [phone]

## 2017-09-04 ENCOUNTER — Telehealth: Payer: Self-pay | Admitting: Licensed Clinical Social Worker

## 2017-09-04 NOTE — Telephone Encounter (Signed)
EMMI flagged patient for answering yes to loss of interest in things and yes to feeling sad/hoepless/anxious/empty. Clinical Child psychotherapist (CSW) attempted to contact patient for the second time via telephone and he did not answer. CSW left patient a voicemail at 11:24 am encourging him to call CSW back. No other calls will be made.   [employer], LCSW [phone]

## 2017-09-30 ENCOUNTER — Other Ambulatory Visit: Payer: Self-pay

## 2017-09-30 ENCOUNTER — Encounter: Payer: Self-pay | Admitting: Emergency Medicine

## 2017-09-30 ENCOUNTER — Emergency Department: Payer: Medicaid Other

## 2017-09-30 ENCOUNTER — Inpatient Hospital Stay
Admission: EM | Admit: 2017-09-30 | Discharge: 2017-10-04 | DRG: 897 | Disposition: A | Discharge status: Home Health | Payer: Medicaid Other | Attending: Internal Medicine | Admitting: Internal Medicine

## 2017-09-30 DIAGNOSIS — Y908 Blood alcohol level of 240 mg/100 ml or more: Secondary | ICD-10-CM | POA: Diagnosis present

## 2017-09-30 DIAGNOSIS — F10939 Alcohol use, unspecified with withdrawal, unspecified: Secondary | ICD-10-CM | POA: Diagnosis present

## 2017-09-30 DIAGNOSIS — Z86718 Personal history of other venous thrombosis and embolism: Secondary | ICD-10-CM

## 2017-09-30 DIAGNOSIS — E440 Moderate protein-calorie malnutrition: Secondary | ICD-10-CM

## 2017-09-30 DIAGNOSIS — F10239 Alcohol dependence with withdrawal, unspecified: Secondary | ICD-10-CM | POA: Diagnosis present

## 2017-09-30 DIAGNOSIS — Z7984 Long term (current) use of oral hypoglycemic drugs: Secondary | ICD-10-CM

## 2017-09-30 DIAGNOSIS — F1020 Alcohol dependence, uncomplicated: Secondary | ICD-10-CM | POA: Diagnosis present

## 2017-09-30 DIAGNOSIS — F10221 Alcohol dependence with intoxication delirium: Secondary | ICD-10-CM | POA: Diagnosis present

## 2017-09-30 DIAGNOSIS — D509 Iron deficiency anemia, unspecified: Secondary | ICD-10-CM | POA: Diagnosis present

## 2017-09-30 DIAGNOSIS — F10231 Alcohol dependence with withdrawal delirium: Secondary | ICD-10-CM

## 2017-09-30 DIAGNOSIS — I110 Hypertensive heart disease with heart failure: Secondary | ICD-10-CM | POA: Diagnosis present

## 2017-09-30 DIAGNOSIS — R197 Diarrhea, unspecified: Secondary | ICD-10-CM | POA: Diagnosis present

## 2017-09-30 DIAGNOSIS — F10931 Alcohol use, unspecified with withdrawal delirium: Secondary | ICD-10-CM

## 2017-09-30 DIAGNOSIS — Z7141 Alcohol abuse counseling and surveillance of alcoholic: Secondary | ICD-10-CM

## 2017-09-30 DIAGNOSIS — F329 Major depressive disorder, single episode, unspecified: Secondary | ICD-10-CM | POA: Diagnosis present

## 2017-09-30 DIAGNOSIS — R740 Nonspecific elevation of levels of transaminase and lactic acid dehydrogenase [LDH]: Secondary | ICD-10-CM | POA: Diagnosis present

## 2017-09-30 DIAGNOSIS — F17210 Nicotine dependence, cigarettes, uncomplicated: Secondary | ICD-10-CM | POA: Diagnosis present

## 2017-09-30 DIAGNOSIS — E1140 Type 2 diabetes mellitus with diabetic neuropathy, unspecified: Secondary | ICD-10-CM | POA: Diagnosis present

## 2017-09-30 DIAGNOSIS — G7000 Myasthenia gravis without (acute) exacerbation: Secondary | ICD-10-CM | POA: Diagnosis present

## 2017-09-30 DIAGNOSIS — Z888 Allergy status to other drugs, medicaments and biological substances status: Secondary | ICD-10-CM

## 2017-09-30 DIAGNOSIS — R809 Proteinuria, unspecified: Secondary | ICD-10-CM | POA: Diagnosis present

## 2017-09-30 DIAGNOSIS — Z89422 Acquired absence of other left toe(s): Secondary | ICD-10-CM

## 2017-09-30 DIAGNOSIS — I429 Cardiomyopathy, unspecified: Secondary | ICD-10-CM | POA: Diagnosis present

## 2017-09-30 DIAGNOSIS — Z6824 Body mass index (BMI) 24.0-24.9, adult: Secondary | ICD-10-CM

## 2017-09-30 DIAGNOSIS — Z79899 Other long term (current) drug therapy: Secondary | ICD-10-CM

## 2017-09-30 DIAGNOSIS — Z8261 Family history of arthritis: Secondary | ICD-10-CM

## 2017-09-30 DIAGNOSIS — M48061 Spinal stenosis, lumbar region without neurogenic claudication: Secondary | ICD-10-CM | POA: Diagnosis present

## 2017-09-30 DIAGNOSIS — Z716 Tobacco abuse counseling: Secondary | ICD-10-CM

## 2017-09-30 DIAGNOSIS — E1165 Type 2 diabetes mellitus with hyperglycemia: Secondary | ICD-10-CM | POA: Diagnosis present

## 2017-09-30 DIAGNOSIS — M47896 Other spondylosis, lumbar region: Secondary | ICD-10-CM | POA: Diagnosis present

## 2017-09-30 DIAGNOSIS — Z7982 Long term (current) use of aspirin: Secondary | ICD-10-CM

## 2017-09-30 DIAGNOSIS — G8929 Other chronic pain: Secondary | ICD-10-CM | POA: Diagnosis present

## 2017-09-30 DIAGNOSIS — F10921 Alcohol use, unspecified with intoxication delirium: Secondary | ICD-10-CM

## 2017-09-30 DIAGNOSIS — I480 Paroxysmal atrial fibrillation: Secondary | ICD-10-CM | POA: Diagnosis present

## 2017-09-30 DIAGNOSIS — M549 Dorsalgia, unspecified: Secondary | ICD-10-CM | POA: Diagnosis present

## 2017-09-30 DIAGNOSIS — I5042 Chronic combined systolic (congestive) and diastolic (congestive) heart failure: Secondary | ICD-10-CM | POA: Diagnosis present

## 2017-09-30 DIAGNOSIS — R296 Repeated falls: Secondary | ICD-10-CM | POA: Diagnosis present

## 2017-09-30 LAB — CBC WITH DIFFERENTIAL/PLATELET
BASOS ABS: 0.1 10*3/uL (ref 0–0.1)
Band Neutrophils: 1 %
Basophils Relative: 2 %
Blasts: 0 %
EOS ABS: 0 10*3/uL (ref 0–0.7)
EOS PCT: 0 %
HCT: 39.4 % — ABNORMAL LOW (ref 40.0–52.0)
Hemoglobin: 12.5 g/dL — ABNORMAL LOW (ref 13.0–18.0)
LYMPHS ABS: 0.9 10*3/uL — AB (ref 1.0–3.6)
Lymphocytes Relative: 16 %
MCH: 24.3 pg — ABNORMAL LOW (ref 26.0–34.0)
MCHC: 31.7 g/dL — ABNORMAL LOW (ref 32.0–36.0)
MCV: 76.8 fL — AB (ref 80.0–100.0)
METAMYELOCYTES PCT: 1 %
MONOS PCT: 3 %
MYELOCYTES: 0 %
Monocytes Absolute: 0.2 10*3/uL (ref 0.2–1.0)
NEUTROS PCT: 77 %
Neutro Abs: 4.3 10*3/uL (ref 1.4–6.5)
Other: 0 %
PLATELETS: 160 10*3/uL (ref 150–440)
Promyelocytes Relative: 0 %
RBC: 5.13 MIL/uL (ref 4.40–5.90)
RDW: 25.2 % — ABNORMAL HIGH (ref 11.5–14.5)
WBC: 5.5 10*3/uL (ref 3.8–10.6)
nRBC: 0 /100 WBC

## 2017-09-30 LAB — COMPREHENSIVE METABOLIC PANEL
ALT: 33 U/L (ref 17–63)
AST: 57 U/L — AB (ref 15–41)
Albumin: 4.3 g/dL (ref 3.5–5.0)
Alkaline Phosphatase: 91 U/L (ref 38–126)
Anion gap: 14 (ref 5–15)
BUN: 9 mg/dL (ref 6–20)
CHLORIDE: 97 mmol/L — AB (ref 101–111)
CO2: 27 mmol/L (ref 22–32)
Calcium: 8.3 mg/dL — ABNORMAL LOW (ref 8.9–10.3)
Creatinine, Ser: 0.66 mg/dL (ref 0.61–1.24)
GFR calc Af Amer: >60 mL/min (ref >60)
Glucose, Bld: 191 mg/dL — ABNORMAL HIGH (ref 65–99)
POTASSIUM: 3.4 mmol/L — AB (ref 3.5–5.1)
SODIUM: 138 mmol/L (ref 135–145)
Total Bilirubin: 0.6 mg/dL (ref 0.3–1.2)
Total Protein: 7.6 g/dL (ref 6.5–8.1)

## 2017-09-30 LAB — ETHANOL: ALCOHOL ETHYL (B): 341 mg/dL — AB (ref <10)

## 2017-09-30 MED ORDER — IBUPROFEN 800 MG PO TABS
800.0000 mg | ORAL_TABLET | Freq: Once | ORAL | Status: AC
  Administered 2017-09-30: 800 mg via ORAL
  Filled 2017-09-30: qty 1

## 2017-09-30 MED ORDER — SODIUM CHLORIDE 0.9 % IV BOLUS
1000.0000 mL | Freq: Once | INTRAVENOUS | Status: AC
  Administered 2017-09-30: 1000 mL via INTRAVENOUS

## 2017-09-30 MED ORDER — LORAZEPAM 2 MG/ML IJ SOLN
2.0000 mg | Freq: Once | INTRAMUSCULAR | Status: AC
  Administered 2017-09-30: 2 mg via INTRAVENOUS

## 2017-09-30 MED ORDER — THIAMINE HCL 100 MG/ML IJ SOLN
Freq: Once | INTRAVENOUS | Status: AC
  Administered 2017-09-30: 21:00:00 via INTRAVENOUS
  Filled 2017-09-30: qty 1000

## 2017-09-30 MED ORDER — LORAZEPAM 2 MG/ML IJ SOLN
INTRAMUSCULAR | Status: AC
  Filled 2017-09-30: qty 1

## 2017-09-30 MED ORDER — LORAZEPAM 2 MG/ML IJ SOLN
2.0000 mg | Freq: Once | INTRAMUSCULAR | Status: AC
  Administered 2017-09-30: 2 mg via INTRAVENOUS
  Filled 2017-09-30: qty 1

## 2017-09-30 NOTE — Discharge Instructions (Signed)
please return for any further problems. °

## 2017-09-30 NOTE — ED Notes (Addendum)
Pt continues to sleep soundly; Pt noted to be incontinent of urine and stool; easily awakened; cleaned well; attends in place to preserve skin integrity; clean sheets on bed; continues to c/o back pain, requesting pain medication

## 2017-09-30 NOTE — ED Notes (Signed)
Pt has fallen back to sleep; resp even and unlabored; ETOH result of 0.341 report received from lab and reported to Dr Darnelle Catalan

## 2017-09-30 NOTE — ED Notes (Signed)
Pt easily awakened; begins visibly shaking as soon as he's awake; continues to c/o 10/10 lower back pain

## 2017-09-30 NOTE — ED Provider Notes (Signed)
[facility] Emergency Department Provider Note   ____________________________________________   First MD Initiated Contact with Patient 09/30/17 1902     (approximate)  I have reviewed the triage vital signs and the nursing notes.   HISTORY  Chief Complaint Delirium Tremens (DTS)    HPI Kurt Horton is a 57 y.o. male Who comes in with tremors saying he is withdrawing from alcohol. He has had a history of DVTs in the past. He usually drinks about 10 minutes easing of liquor a day has not had any since yesterday.he is very tremulous but is making sense.   Past Medical History:  Diagnosis Date  . A-fib (HCC)   . Alcohol abuse   . Alcohol withdrawal (HCC) 11/12/2016  . Diabetes mellitus without complication (HCC)   . DJD (degenerative joint disease)   . Hypertension   . Myasthenia gravis (HCC)   . Renal disorder     Patient Active Problem List   Diagnosis Date Noted  . Chronic combined systolic and diastolic CHF (congestive heart failure) (HCC) 08/13/2017  . Alcohol withdrawal delirium (HCC) 02/26/2017  . Atrial fibrillation with RVR (HCC) 02/25/2017  . Acute alcoholic intoxication with complication (HCC)   . Alcohol abuse   . Alcohol withdrawal (HCC) 11/13/2016  . Substance induced mood disorder (HCC) 11/13/2016  . SVT (supraventricular tachycardia) (HCC) 11/12/2016  . HTN (hypertension) 11/12/2016  . EtOH dependence (HCC) 11/12/2016  . Lumbar radiculopathy 08/24/2016  . Depression with anxiety 08/24/2016  . Chronic low back pain 01/04/2016  . Pressure ulcer 12/29/2015  . Osteomyelitis (HCC) 12/26/2015  . PVD (peripheral vascular disease) (HCC) 12/26/2015  . Type II diabetes mellitus with manifestations (HCC) 12/26/2015  . Myasthenia gravis (HCC) 12/26/2015    Past Surgical History:  Procedure Laterality Date  . AMPUTATION TOE Left 12/29/2015   Procedure: AMPUTATION TOE;  Surgeon: Gwyneth Revels, DPM;  Location: [facility] ORS;  Service:  Podiatry;  Laterality: Left;  . PERIPHERAL VASCULAR CATHETERIZATION N/A 12/27/2015   Procedure: Lower Extremity Angiography;  Surgeon: Annice Needy, MD;  Location: [facility] INVASIVE CV LAB;  Service: Cardiovascular;  Laterality: N/A;  . right hip surgery    . right shoulder surgery      Prior to Admission medications   Medication Sig Start Date End Date Taking? Authorizing Provider  amiodarone (PACERONE) 200 MG tablet Take 1 tablet (200 mg total) by mouth daily. 08/25/17  Yes Auburn Bilberry, MD  aspirin EC 325 MG EC tablet Take 1 tablet (325 mg total) by mouth daily. 08/18/17  Yes Auburn Bilberry, MD  B Complex-C (B-COMPLEX WITH VITAMIN C) tablet Take 1 tablet by mouth daily.   Yes [provider]  chlordiazePOXIDE (LIBRIUM) 5 MG capsule Take 1 capsule (5 mg total) by mouth 3 (three) times daily as needed for anxiety. 08/17/17  Yes Auburn Bilberry, MD  citalopram (CELEXA) 20 MG tablet Take 1 tablet (20 mg total) by mouth daily. 08/24/16  Yes Sater, Pearletha Furl, MD  furosemide (LASIX) 40 MG tablet Take 40 mg by mouth daily as needed for fluid (if taking prednisone).    Yes [provider]  gabapentin (NEURONTIN) 300 MG capsule Take 1 capsule (300 mg total) by mouth 3 (three) times daily. Patient taking differently: Take 600 mg 3 (three) times daily by mouth.  03/01/17  Yes Enid Baas, MD  metFORMIN (GLUCOPHAGE) 500 MG tablet Take 1 tablet (500 mg total) by mouth daily. 12/30/15  Yes Wieting, Richard, MD  metoprolol succinate (TOPROL-XL) 50 MG  24 hr tablet Take 1 tablet (50 mg total) by mouth daily. Take with or immediately following a meal. 08/18/17  Yes Auburn Bilberry, MD  multivitamin (ONE-A-DAY MEN'S) TABS tablet Take 1 tablet by mouth daily. 08/18/17  Yes Wieting, Richard, MD  mycophenolate (CELLCEPT) 500 MG tablet Take 1 tablet ( ) by mouth every morning and 2 tablets ( ) by mouth every evening   Yes [provider]  potassium chloride (K-DUR) 10 MEQ tablet  Take 10 mEq by mouth daily. 06/21/17 06/21/18 Yes [provider]    Allergies Other  Family History  Problem Relation Age of Onset  . Rheum arthritis Father     Social History Social History   Tobacco Use  . Smoking status: Current Every Day Smoker    Packs/day: 0.50    Types: Cigarettes  . Smokeless tobacco: Never Used  Substance Use Topics  . Alcohol use: Yes    Comment: Pt had tequila and rum, at least a 5th or a pint a day  . Drug use: No    Review of Systems  Constitutional: No fever/chills Eyes: No visual changes. ENT: No sore throat. Cardiovascular: Denies chest pain. Respiratory: Denies shortness of breath. Gastrointestinal: No abdominal pain.  No nausea, no vomiting.  No diarrhea.  No constipation. Genitourinary: Negative for dysuria. Musculoskeletal: Negative for back pain. Skin: Negative for rash. Neurological: Negative for headaches, focal weakness  ____________________________________________   PHYSICAL EXAM:  VITAL SIGNS: ED Triage Vitals  Enc Vitals Group     BP      Pulse      Resp      Temp      Temp src      SpO2      Weight      Height      Head Circumference      Peak Flow      Pain Score      Pain Loc      Pain Edu?      Excl. in GC?     Constitutional: Alert and oriented.very shaky Eyes: Conjunctivae are normal.  Head: Atraumatic. Nose: No congestion/rhinnorhea. Mouth/Throat: Mucous membranes are moist.  Oropharynx non-erythematous. Neck: No stridor.   Cardiovascular: Normal rate, regular rhythm. Grossly normal heart sounds.  Good peripheral circulation. Respiratory: Normal respiratory effort.  No retractions. Lungs CTAB. Gastrointestinal: Soft and nontender. No distention. No abdominal bruits. No CVA tenderness. Musculoskeletal: No lower extremity tenderness nor edema.   Neurologic:  Normal speech and language. No gross focal neurologic deficits are appreciated. Skin:  Skin is warm, dry and intact. No rash  noted. Psychiatric: Mood and affect are normal. Speech and behavior are normal.  ____________________________________________   LABS (all labs ordered are listed, but only abnormal results are displayed)  Labs Reviewed  COMPREHENSIVE METABOLIC PANEL - Abnormal; Notable for the following components:      Result Value   Potassium 3.4 (*)    Chloride 97 (*)    Glucose, Bld 191 (*)    Calcium 8.3 (*)    AST 57 (*)    All other components within normal limits  CBC WITH DIFFERENTIAL/PLATELET - Abnormal; Notable for the following components:   Hemoglobin 12.5 (*)    HCT 39.4 (*)    MCV 76.8 (*)    MCH 24.3 (*)    MCHC 31.7 (*)    RDW 25.2 (*)    Lymphs Abs 0.9 (*)    All other components within normal limits  ETHANOL - Abnormal; Notable  for the following components:   Alcohol, Ethyl (B) 341 (*)    All other components within normal limits  URINALYSIS, COMPLETE (UACMP) WITH MICROSCOPIC  URINE DRUG SCREEN, QUALITATIVE ([facility] ONLY)   ____________________________________________  EKG   ____________________________________________  RADIOLOGY  ED MD interpretation:   Official radiology report(s): Dg Chest Portable 1 View  Result Date: 09/30/2017 CLINICAL DATA:  Altered mental status and ethanol use today. Withdrawal. EXAM: PORTABLE CHEST 1 VIEW COMPARISON:  08/13/2017 FINDINGS: Mild S-shaped thoracolumbar spine curvature. Midline trachea. Borderline cardiomegaly. No pleural effusion or pneumothorax. Mild right hemidiaphragm elevation. Mild biapical pleural thickening. Clear lungs. IMPRESSION: No acute cardiopulmonary disease. Electronically Signed   By: Jeronimo Greaves M.D.   On: 09/30/2017 19:19    ____________________________________________   PROCEDURES  Procedure(s) performed:  Procedures  Critical Care performed:  ____________________________________________   INITIAL IMPRESSION / ASSESSMENT AND PLAN / ED COURSE  we will try to see if the patient can get in to [facility] tomorrow. He is still intoxicated now he says he wants detox he does not appear to actually be having delirium tremens at all. In fact I do not believe he was actually having withdrawal from his alcohol. His level was quite high. I have signed him out to Dr. Lenard Lance we will watch him to see if we can transfer on to [facility] in the morning.        ____________________________________________   FINAL CLINICAL IMPRESSION(S) / ED DIAGNOSES  Final diagnoses:  Uncomplicated alcohol dependence [facility])     ED Discharge Orders    None       Note:  This document was prepared using Dragon voice recognition software and may include unintentional dictation errors.    Arnaldo Natal, MD 09/30/17 [phone]

## 2017-09-30 NOTE — ED Triage Notes (Addendum)
Pt arrived via EMS from home  C/o alcohol withdrawal; pt visibly shaking; Dr Darnelle Catalan at bedside; pt says his last drink was sometime Saturday; pt says he's normally a daily drinker of "a bunch" of beer and liquor, but mostly liquor; pt also c/o chronic back pain from a fall years ago; rates back pain 10/10; pt answering questions in complete coherent sentences; skin clammy

## 2017-09-30 NOTE — ED Notes (Addendum)
TTS Keisha in to speak with pt; pt told her he was hungry and thirsty; she states pt is to go to detox tomorrow if willing, no bed available tonight

## 2017-09-30 NOTE — ED Notes (Signed)
Eyes closed, resp even and unlabored 

## 2017-10-01 ENCOUNTER — Encounter: Payer: Self-pay | Admitting: Internal Medicine

## 2017-10-01 ENCOUNTER — Other Ambulatory Visit: Payer: Self-pay

## 2017-10-01 DIAGNOSIS — F10230 Alcohol dependence with withdrawal, uncomplicated: Secondary | ICD-10-CM

## 2017-10-01 DIAGNOSIS — Z888 Allergy status to other drugs, medicaments and biological substances status: Secondary | ICD-10-CM | POA: Diagnosis not present

## 2017-10-01 DIAGNOSIS — Y908 Blood alcohol level of 240 mg/100 ml or more: Secondary | ICD-10-CM | POA: Diagnosis present

## 2017-10-01 DIAGNOSIS — R197 Diarrhea, unspecified: Secondary | ICD-10-CM | POA: Diagnosis present

## 2017-10-01 DIAGNOSIS — F17210 Nicotine dependence, cigarettes, uncomplicated: Secondary | ICD-10-CM | POA: Diagnosis present

## 2017-10-01 DIAGNOSIS — M48061 Spinal stenosis, lumbar region without neurogenic claudication: Secondary | ICD-10-CM | POA: Diagnosis present

## 2017-10-01 DIAGNOSIS — F10221 Alcohol dependence with intoxication delirium: Secondary | ICD-10-CM | POA: Diagnosis present

## 2017-10-01 DIAGNOSIS — F329 Major depressive disorder, single episode, unspecified: Secondary | ICD-10-CM | POA: Diagnosis present

## 2017-10-01 DIAGNOSIS — Z79899 Other long term (current) drug therapy: Secondary | ICD-10-CM | POA: Diagnosis not present

## 2017-10-01 DIAGNOSIS — Z8261 Family history of arthritis: Secondary | ICD-10-CM | POA: Diagnosis not present

## 2017-10-01 DIAGNOSIS — F10231 Alcohol dependence with withdrawal delirium: Secondary | ICD-10-CM | POA: Diagnosis present

## 2017-10-01 DIAGNOSIS — Z7982 Long term (current) use of aspirin: Secondary | ICD-10-CM | POA: Diagnosis not present

## 2017-10-01 DIAGNOSIS — I5042 Chronic combined systolic (congestive) and diastolic (congestive) heart failure: Secondary | ICD-10-CM | POA: Diagnosis present

## 2017-10-01 DIAGNOSIS — G8929 Other chronic pain: Secondary | ICD-10-CM | POA: Diagnosis present

## 2017-10-01 DIAGNOSIS — R809 Proteinuria, unspecified: Secondary | ICD-10-CM | POA: Diagnosis present

## 2017-10-01 DIAGNOSIS — Z7984 Long term (current) use of oral hypoglycemic drugs: Secondary | ICD-10-CM | POA: Diagnosis not present

## 2017-10-01 DIAGNOSIS — E1165 Type 2 diabetes mellitus with hyperglycemia: Secondary | ICD-10-CM | POA: Diagnosis present

## 2017-10-01 DIAGNOSIS — D509 Iron deficiency anemia, unspecified: Secondary | ICD-10-CM | POA: Diagnosis present

## 2017-10-01 DIAGNOSIS — E1140 Type 2 diabetes mellitus with diabetic neuropathy, unspecified: Secondary | ICD-10-CM | POA: Diagnosis present

## 2017-10-01 DIAGNOSIS — I429 Cardiomyopathy, unspecified: Secondary | ICD-10-CM | POA: Diagnosis present

## 2017-10-01 DIAGNOSIS — Z89422 Acquired absence of other left toe(s): Secondary | ICD-10-CM | POA: Diagnosis not present

## 2017-10-01 DIAGNOSIS — M47896 Other spondylosis, lumbar region: Secondary | ICD-10-CM | POA: Diagnosis present

## 2017-10-01 DIAGNOSIS — M549 Dorsalgia, unspecified: Secondary | ICD-10-CM | POA: Diagnosis present

## 2017-10-01 DIAGNOSIS — E440 Moderate protein-calorie malnutrition: Secondary | ICD-10-CM | POA: Diagnosis present

## 2017-10-01 DIAGNOSIS — Z7141 Alcohol abuse counseling and surveillance of alcoholic: Secondary | ICD-10-CM | POA: Diagnosis not present

## 2017-10-01 DIAGNOSIS — G7000 Myasthenia gravis without (acute) exacerbation: Secondary | ICD-10-CM | POA: Diagnosis present

## 2017-10-01 LAB — PHOSPHORUS: Phosphorus: 2.4 mg/dL — ABNORMAL LOW (ref 2.5–4.6)

## 2017-10-01 LAB — GLUCOSE, CAPILLARY
GLUCOSE-CAPILLARY: 118 mg/dL — AB (ref 65–99)
Glucose-Capillary: 97 mg/dL (ref 65–99)
Glucose-Capillary: 117 mg/dL — ABNORMAL HIGH (ref 65–99)

## 2017-10-01 LAB — URINE DRUG SCREEN, QUALITATIVE (ARMC ONLY)
Amphetamines, Ur Screen: NOT DETECTED
Barbiturates, Ur Screen: NOT DETECTED
Benzodiazepine, Ur Scrn: POSITIVE — AB
COCAINE METABOLITE, UR ~~LOC~~: NOT DETECTED
Cannabinoid 50 Ng, Ur ~~LOC~~: NOT DETECTED
MDMA (ECSTASY) UR SCREEN: NOT DETECTED
METHADONE SCREEN, URINE: NOT DETECTED
Opiate, Ur Screen: NOT DETECTED
Phencyclidine (PCP) Ur S: NOT DETECTED
TRICYCLIC, UR SCREEN: NOT DETECTED

## 2017-10-01 LAB — URINALYSIS, COMPLETE (UACMP) WITH MICROSCOPIC
BACTERIA UA: NONE SEEN
Bilirubin Urine: NEGATIVE
Glucose, UA: 50 mg/dL — AB
Hgb urine dipstick: NEGATIVE
Ketones, ur: 5 mg/dL — AB
Leukocytes, UA: NEGATIVE
Nitrite: NEGATIVE
Protein, ur: 30 mg/dL — AB
Specific Gravity, Urine: 1.017 (ref 1.005–1.030)
pH: 6 (ref 5.0–8.0)

## 2017-10-01 LAB — MAGNESIUM: MAGNESIUM: 1.3 mg/dL — AB (ref 1.7–2.4)

## 2017-10-01 MED ORDER — OXYCODONE HCL 5 MG PO TABS
2.5000 mg | ORAL_TABLET | ORAL | Status: DC | PRN
  Administered 2017-10-01: 2.5 mg via ORAL
  Filled 2017-10-01: qty 1

## 2017-10-01 MED ORDER — AMIODARONE HCL 200 MG PO TABS
200.0000 mg | ORAL_TABLET | Freq: Every day | ORAL | Status: DC
  Administered 2017-10-01 – 2017-10-03 (×3): 200 mg via ORAL
  Filled 2017-10-01 (×3): qty 1

## 2017-10-01 MED ORDER — LORAZEPAM 1 MG PO TABS: 1.0000 mg | ORAL_TABLET | Freq: Four times a day (QID) | ORAL | Status: DC | PRN

## 2017-10-01 MED ORDER — NICOTINE 21 MG/24HR TD PT24
21.0000 mg | MEDICATED_PATCH | Freq: Every day | TRANSDERMAL | Status: DC
  Administered 2017-10-01 – 2017-10-04 (×4): 21 mg via TRANSDERMAL
  Filled 2017-10-01 (×4): qty 1

## 2017-10-01 MED ORDER — ADULT MULTIVITAMIN W/MINERALS CH: 1.0000 | ORAL_TABLET | Freq: Every day | ORAL | Status: DC

## 2017-10-01 MED ORDER — ASPIRIN EC 325 MG PO TBEC
325.0000 mg | DELAYED_RELEASE_TABLET | Freq: Every day | ORAL | Status: DC
  Administered 2017-10-01 – 2017-10-04 (×4): 325 mg via ORAL
  Filled 2017-10-01 (×4): qty 1

## 2017-10-01 MED ORDER — LORAZEPAM 2 MG PO TABS
2.0000 mg | ORAL_TABLET | Freq: Once | ORAL | Status: AC
  Administered 2017-10-01: 2 mg via ORAL

## 2017-10-01 MED ORDER — METOPROLOL SUCCINATE ER 50 MG PO TB24
50.0000 mg | ORAL_TABLET | Freq: Every day | ORAL | Status: DC
  Administered 2017-10-01: 50 mg via ORAL
  Filled 2017-10-01: qty 1

## 2017-10-01 MED ORDER — LORAZEPAM 2 MG/ML IJ SOLN
0.0000 mg | Freq: Four times a day (QID) | INTRAMUSCULAR | Status: DC
  Administered 2017-10-01: 1 mg via INTRAVENOUS
  Administered 2017-10-01: 2 mg via INTRAVENOUS
  Filled 2017-10-01 (×2): qty 1

## 2017-10-01 MED ORDER — ENOXAPARIN SODIUM 40 MG/0.4ML ~~LOC~~ SOLN
40.0000 mg | SUBCUTANEOUS | Status: DC
  Administered 2017-10-01 – 2017-10-03 (×3): 40 mg via SUBCUTANEOUS
  Filled 2017-10-01 (×3): qty 0.4

## 2017-10-01 MED ORDER — MYCOPHENOLATE MOFETIL 250 MG PO CAPS
1000.0000 mg | ORAL_CAPSULE | Freq: Every evening | ORAL | Status: DC
  Administered 2017-10-01 – 2017-10-03 (×2): 1000 mg via ORAL
  Filled 2017-10-01 (×4): qty 4

## 2017-10-01 MED ORDER — INSULIN ASPART 100 UNIT/ML ~~LOC~~ SOLN
0.0000 [IU] | Freq: Three times a day (TID) | SUBCUTANEOUS | Status: DC
  Administered 2017-10-02 – 2017-10-04 (×6): 2 [IU] via SUBCUTANEOUS
  Filled 2017-10-01 (×6): qty 1

## 2017-10-01 MED ORDER — OXYCODONE HCL 5 MG PO TABS
5.0000 mg | ORAL_TABLET | Freq: Four times a day (QID) | ORAL | Status: DC | PRN
  Administered 2017-10-01 – 2017-10-02 (×4): 5 mg via ORAL
  Filled 2017-10-01 (×5): qty 1

## 2017-10-01 MED ORDER — ONDANSETRON HCL 4 MG/2ML IJ SOLN
4.0000 mg | Freq: Four times a day (QID) | INTRAMUSCULAR | Status: DC | PRN
  Administered 2017-10-01: 4 mg via INTRAVENOUS
  Filled 2017-10-01: qty 2

## 2017-10-01 MED ORDER — POTASSIUM CHLORIDE CRYS ER 20 MEQ PO TBCR
40.0000 meq | EXTENDED_RELEASE_TABLET | Freq: Two times a day (BID) | ORAL | Status: AC
  Administered 2017-10-01 (×2): 40 meq via ORAL
  Filled 2017-10-01 (×2): qty 2

## 2017-10-01 MED ORDER — GABAPENTIN 300 MG PO CAPS
600.0000 mg | ORAL_CAPSULE | Freq: Three times a day (TID) | ORAL | Status: DC
  Administered 2017-10-01 – 2017-10-04 (×10): 600 mg via ORAL
  Filled 2017-10-01 (×11): qty 2

## 2017-10-01 MED ORDER — THIAMINE HCL 100 MG/ML IJ SOLN: 100.0000 mg | Freq: Every day | INTRAMUSCULAR | Status: DC

## 2017-10-01 MED ORDER — VITAMIN B-1 100 MG PO TABS
100.0000 mg | ORAL_TABLET | Freq: Every day | ORAL | Status: DC
  Administered 2017-10-01 – 2017-10-04 (×4): 100 mg via ORAL
  Filled 2017-10-01 (×4): qty 1

## 2017-10-01 MED ORDER — LORAZEPAM 2 MG/ML IJ SOLN
0.0000 mg | Freq: Two times a day (BID) | INTRAMUSCULAR | Status: DC
  Administered 2017-10-03 (×2): 2 mg via INTRAVENOUS
  Filled 2017-10-01 (×2): qty 1

## 2017-10-01 MED ORDER — SENNOSIDES-DOCUSATE SODIUM 8.6-50 MG PO TABS: 1.0000 | ORAL_TABLET | Freq: Every evening | ORAL | Status: DC | PRN

## 2017-10-01 MED ORDER — ADULT MULTIVITAMIN W/MINERALS CH
1.0000 | ORAL_TABLET | Freq: Every day | ORAL | Status: DC
  Administered 2017-10-01 – 2017-10-04 (×4): 1 via ORAL
  Filled 2017-10-01 (×4): qty 1

## 2017-10-01 MED ORDER — FOLIC ACID 1 MG PO TABS
1.0000 mg | ORAL_TABLET | Freq: Every day | ORAL | Status: DC
  Administered 2017-10-01 – 2017-10-04 (×4): 1 mg via ORAL
  Filled 2017-10-01 (×4): qty 1

## 2017-10-01 MED ORDER — LORAZEPAM 1 MG PO TABS
ORAL_TABLET | ORAL | Status: AC
  Filled 2017-10-01: qty 2

## 2017-10-01 MED ORDER — MYCOPHENOLATE MOFETIL 250 MG PO CAPS
500.0000 mg | ORAL_CAPSULE | Freq: Every day | ORAL | Status: DC
  Administered 2017-10-01 – 2017-10-04 (×4): 500 mg via ORAL
  Filled 2017-10-01 (×5): qty 2

## 2017-10-01 MED ORDER — CITALOPRAM HYDROBROMIDE 20 MG PO TABS
20.0000 mg | ORAL_TABLET | Freq: Every day | ORAL | Status: DC
  Administered 2017-10-01 – 2017-10-04 (×4): 20 mg via ORAL
  Filled 2017-10-01 (×4): qty 1

## 2017-10-01 MED ORDER — LORAZEPAM 2 MG/ML IJ SOLN
0.0000 mg | Freq: Four times a day (QID) | INTRAMUSCULAR | Status: AC
  Administered 2017-10-01: 2 mg via INTRAVENOUS
  Administered 2017-10-01: 1 mg via INTRAVENOUS
  Administered 2017-10-02: 2 mg via INTRAVENOUS
  Administered 2017-10-02 – 2017-10-03 (×2): 1 mg via INTRAVENOUS
  Filled 2017-10-01 (×4): qty 1
  Filled 2017-10-01: qty 2
  Filled 2017-10-01: qty 1

## 2017-10-01 MED ORDER — LORAZEPAM 2 MG/ML IJ SOLN
1.0000 mg | Freq: Four times a day (QID) | INTRAMUSCULAR | Status: DC | PRN
  Administered 2017-10-02: 1 mg via INTRAVENOUS
  Filled 2017-10-01: qty 1

## 2017-10-01 MED ORDER — BISACODYL 5 MG PO TBEC: 5.0000 mg | DELAYED_RELEASE_TABLET | Freq: Every day | ORAL | Status: DC | PRN

## 2017-10-01 MED ORDER — ONDANSETRON HCL 4 MG PO TABS: 4.0000 mg | ORAL_TABLET | Freq: Four times a day (QID) | ORAL | Status: DC | PRN

## 2017-10-01 MED ORDER — LIDOCAINE 5 % EX PTCH
1.0000 | MEDICATED_PATCH | CUTANEOUS | Status: DC
  Administered 2017-10-01 – 2017-10-04 (×4): 1 via TRANSDERMAL
  Filled 2017-10-01 (×4): qty 1

## 2017-10-01 NOTE — ED Notes (Signed)
Attempted to call pt's father at his request to inform him of admission instead of detox today; no answer at number; no message left regarding pt's admission; pt says he will try to call his father later, does not wish to speak with him at this time had he answered;

## 2017-10-01 NOTE — ED Notes (Signed)
Yellow fall risk band placed on pt's left wrist; yellow fall risks socks placed on pt's feet; pt understands to call for help if he needs to get out of bed for any needs;

## 2017-10-01 NOTE — Consult Note (Signed)
[facility] Consult   Reason for Consult: Consult for patient with alcohol abuse Referring Physician: Mody Patient Identification: Kurt Horton MRN:  [id number] Principal Diagnosis: Alcohol withdrawal [facility]) Diagnosis:   Patient Active Problem List   Diagnosis Date Noted  . Alcohol abuse [F10.10]     Priority: High  . Alcohol withdrawal ([place]) [F10.239] 11/13/2016    Priority: High  . Chronic combined systolic and diastolic CHF (congestive heart failure) ([place]) [I50.42] 08/13/2017  . Alcohol withdrawal delirium ([place]) [F10.231] 02/26/2017  . Atrial fibrillation with RVR ([place]) [I48.91] 02/25/2017  . Acute alcoholic intoxication with complication ([place]) [M57.846]   . Substance induced mood disorder ([place]) [F19.94] 11/13/2016  . SVT (supraventricular tachycardia) ([place]) [I47.1] 11/12/2016  . HTN (hypertension) [I10] 11/12/2016  . EtOH dependence ([place]) [F10.20] 11/12/2016  . Lumbar radiculopathy [M54.16] 08/24/2016  . Depression with anxiety [F41.8] 08/24/2016  . Chronic low back pain [M54.5, G89.29] 01/04/2016  . Pressure ulcer [L89.90] 12/29/2015  . Osteomyelitis ([place]) [M86.9] 12/26/2015  . PVD (peripheral vascular disease) ([place]) [I73.9] 12/26/2015  . Type II diabetes mellitus with manifestations ([place]) [E11.8] 12/26/2015  . Myasthenia gravis ([place]) [G70.00] 12/26/2015    Total Time spent with patient: 1 hour  Subjective:   Kurt Horton is a 57 y.o. male patient admitted with "I am not feeling good" patient seen chart reviewed.  Patient known from several prior encounters.  57 year old man with alcohol dependence.  Comes into the hospital once again.  HPI: Very sick week requiring detox.  Unable to stand.  Having falls at home.  Patient tells me he continues to drink somewhere in the neighborhood of 10-20 shots of liquor a day.  Apparently in the time since I saw him last in April he never did make an effort to get into any kind of detox or rehab.  Mood continues to be flat  and dull and disappointed.  Denies any suicidal or homicidal thoughts.  Currently he is lucid and denies any hallucinations.  Social history: Lives with his parents.  I know they are very frustrated with his chronic drinking.  Medical history: Patient has diabetes.  He is getting more and more neuropathy from the diabetes the high blood pressure and the alcohol abuse.  He is getting to the point where he can hardly even stand up.  History of atrial fibrillation.  Substance abuse history: Long-standing very heavy alcohol abuse with history of DTs or seizures multiple episodes of detox.  He seems to be getting more run down with time.  Patient has been referred to rehab many times but has very little sustained sobriety.  Past Psychiatric History: Has been seen many times in the past for his alcohol abuse.  Patient does not have a history of suicidality does not have a history of responding to or needing antidepressant medicine.  Risk to Self: Is patient at risk for suicide?: No Risk to Others:   Prior Inpatient Therapy:   Prior Outpatient Therapy:    Past Medical History:  Past Medical History:  Diagnosis Date  . A-fib (Kurt Horton)   . Alcohol abuse   . Alcohol withdrawal ([place]) 11/12/2016  . Diabetes mellitus without complication (Kurt Horton)   . DJD (degenerative joint disease)   . Hypertension   . Myasthenia gravis ([place])   . Renal disorder     Past Surgical History:  Procedure Laterality Date  . AMPUTATION TOE Left 12/29/2015   Procedure: AMPUTATION TOE;  Surgeon: Samara Deist, DPM;  Location: [facility] ORS;  Service:  Podiatry;  Laterality: Left;  . PERIPHERAL VASCULAR CATHETERIZATION N/A 12/27/2015   Procedure: Lower Extremity Angiography;  Surgeon: Algernon Huxley, MD;  Location: [facility];  Service: Cardiovascular;  Laterality: N/A;  . right hip surgery    . right shoulder surgery     Family History:  Family History  Problem Relation Age of Onset  . Rheum arthritis Father    Family  Psychiatric  History: None Social History:  Social History   Substance and Sexual Activity  Alcohol Use Yes   Comment: Pt had tequila and rum, at least a 5th or a pint a day     Social History   Substance and Sexual Activity  Drug Use No    Social History   Socioeconomic History  . Marital status: Divorced    Spouse name: Not on file  . Number of children: Not on file  . Years of education: Not on file  . Highest education level: Not on file  Occupational History  . Not on file  Social Needs  . Financial resource strain: Not on file  . Food insecurity:    Worry: Not on file    Inability: Not on file  . Transportation needs:    Medical: Not on file    Non-medical: Not on file  Tobacco Use  . Smoking status: Current Every Day Smoker    Packs/day: 0.50    Types: Cigarettes  . Smokeless tobacco: Never Used  Substance and Sexual Activity  . Alcohol use: Yes    Comment: Pt had tequila and rum, at least a 5th or a pint a day  . Drug use: No  . Sexual activity: Not on file  Lifestyle  . Physical activity:    Days per week: Not on file    Minutes per session: Not on file  . Stress: Not on file  Relationships  . Social connections:    Talks on phone: Not on file    Gets together: Not on file    Attends religious service: Not on file    Active member of club or organization: Not on file    Attends meetings of clubs or organizations: Not on file    Relationship status: Not on file  Other Topics Concern  . Not on file  Social History Narrative   Live with Parents, ambulates with a cane. Falls lately.   Additional Social History:    Allergies:   Allergies  Allergen Reactions  . Other Other (See Comments)    Patient states he can't take any "mycin" medications because myasthenia gravis.    Labs:  Results for orders placed or performed during the hospital encounter of 09/30/17 (from the past 48 hour(s))  Comprehensive metabolic panel     Status: Abnormal    Collection Time: 09/30/17  7:19 PM  Result Value Ref Range   Sodium 138 135 - 145 mmol/L   Potassium 3.4 (L) 3.5 - 5.1 mmol/L   Chloride 97 (L) 101 - 111 mmol/L   CO2 27 22 - 32 mmol/L   Glucose, Bld 191 (H) 65 - 99 mg/dL   BUN 9 6 - 20 mg/dL   Creatinine, Ser 0.66 0.61 - 1.24 mg/dL   Calcium 8.3 (L) 8.9 - 10.3 mg/dL   Total Protein 7.6 6.5 - 8.1 g/dL   Albumin 4.3 3.5 - 5.0 g/dL   AST 57 (H) 15 - 41 U/L   ALT 33 17 - 63 U/L   Alkaline Phosphatase 91  38 - 126 U/L   Total Bilirubin 0.6 0.3 - 1.2 mg/dL   GFR calc non Af Amer >60 >60 mL/min   GFR calc Af Amer >60 >60 mL/min    Comment: (NOTE) The eGFR has been calculated using the CKD EPI equation. This calculation has not been validated in all clinical situations. eGFR's persistently <60 mL/min signify possible Chronic Kidney Disease.    Anion gap 14 5 - 15    Comment: Performed at [facility], [address] [place], [place] [address]  CBC with Differential     Status: Abnormal   Collection Time: 09/30/17  7:19 PM  Result Value Ref Range   WBC 5.5 3.8 - 10.6 K/uL   RBC 5.13 4.40 - 5.90 MIL/uL   Hemoglobin 12.5 (L) 13.0 - 18.0 g/dL   HCT 39.4 (L) 40.0 - 52.0 %   MCV 76.8 (L) 80.0 - 100.0 fL   MCH 24.3 (L) 26.0 - 34.0 pg   MCHC 31.7 (L) 32.0 - 36.0 g/dL   RDW 25.2 (H) 11.5 - 14.5 %   Platelets 160 150 - 440 K/uL   Neutrophils Relative % 77 %   Lymphocytes Relative 16 %   Monocytes Relative 3 %   Eosinophils Relative 0 %   Basophils Relative 2 %   Band Neutrophils 1 %   Metamyelocytes Relative 1 %   Myelocytes 0 %   Promyelocytes Relative 0 %   Blasts 0 %   nRBC 0 0 /100 WBC   Other 0 %   Neutro Abs 4.3 1.4 - 6.5 K/uL   Lymphs Abs 0.9 (L) 1.0 - 3.6 K/uL   Monocytes Absolute 0.2 0.2 - 1.0 K/uL   Eosinophils Absolute 0.0 0 - 0.7 K/uL   Basophils Absolute 0.1 0 - 0.1 K/uL   RBC Morphology MIXED RBC POPULATION     Comment: Performed at [facility], [address] [place], [place] [address]   Urinalysis, Complete w Microscopic     Status: Abnormal   Collection Time: 09/30/17  7:20 PM  Result Value Ref Range   Color, Urine YELLOW (A) YELLOW   APPearance CLEAR (A) CLEAR   Specific Gravity, Urine 1.017 1.005 - 1.030   pH 6.0 5.0 - 8.0   Glucose, UA 50 (A) NEGATIVE mg/dL   Hgb urine dipstick NEGATIVE NEGATIVE   Bilirubin Urine NEGATIVE NEGATIVE   Ketones, ur 5 (A) NEGATIVE mg/dL   Protein, ur 30 (A) NEGATIVE mg/dL   Nitrite NEGATIVE NEGATIVE   Leukocytes, UA NEGATIVE NEGATIVE   RBC / HPF 0-5 0 - 5 RBC/hpf   WBC, UA 0-5 0 - 5 WBC/hpf   Bacteria, UA NONE SEEN NONE SEEN   Squamous Epithelial / LPF 0-5 0 - 5   Mucus PRESENT     Comment: Performed at [facility], [address] [place], [place] [address]  Urine Drug Screen, Qualitative     Status: Abnormal   Collection Time: 09/30/17  7:20 PM  Result Value Ref Range   Tricyclic, Ur Screen NONE DETECTED NONE DETECTED   Amphetamines, Ur Screen NONE DETECTED NONE DETECTED   MDMA (Ecstasy)Ur Screen NONE DETECTED NONE DETECTED   Cocaine Metabolite,Ur [place] NONE DETECTED NONE DETECTED   Opiate, Ur Screen NONE DETECTED NONE DETECTED   Phencyclidine (PCP) Ur S NONE DETECTED NONE DETECTED   Cannabinoid 50 Ng, Ur [place] NONE DETECTED NONE DETECTED   Barbiturates, Ur Screen NONE DETECTED NONE DETECTED   Benzodiazepine, Ur Scrn POSITIVE (A) NONE  DETECTED   Methadone Scn, Ur NONE DETECTED NONE DETECTED    Comment: (NOTE) Tricyclics + metabolites, urine    Cutoff 1000 ng/mL Amphetamines + metabolites, urine  Cutoff 1000 ng/mL MDMA (Ecstasy), urine              Cutoff 500 ng/mL Cocaine Metabolite, urine          Cutoff 300 ng/mL Opiate + metabolites, urine        Cutoff 300 ng/mL Phencyclidine (PCP), urine         Cutoff 25 ng/mL Cannabinoid, urine                 Cutoff 50 ng/mL Barbiturates + metabolites, urine  Cutoff 200 ng/mL Benzodiazepine, urine              Cutoff 200 ng/mL Methadone, urine                   Cutoff 300  ng/mL The urine drug screen provides only a preliminary, unconfirmed analytical test result and should not be used for non-medical purposes. Clinical consideration and professional judgment should be applied to any positive drug screen result due to possible interfering substances. A more specific alternate chemical method must be used in order to obtain a confirmed analytical result. Gas chromatography / mass spectrometry (GC/MS) is the preferred confirmat ory method. Performed at [facility], [address] [place], [place] [address]   Ethanol     Status: Abnormal   Collection Time: 09/30/17  7:22 PM  Result Value Ref Range   Alcohol, Ethyl (B) 341 (HH) <10 mg/dL    Comment: CRITICAL RESULT CALLED TO, READ BACK BY AND VERIFIED WITH LAURIE ALLEN AT 2031 ON 09/30/17 RWW (NOTE) Lowest detectable limit for serum alcohol is 10 mg/dL. For medical purposes only. Performed at [facility], [address] [place], [place] [address]   Magnesium     Status: Abnormal   Collection Time: 10/01/17  8:14 AM  Result Value Ref Range   Magnesium 1.3 (L) 1.7 - 2.4 mg/dL    Comment: Performed at [facility], [address] [place], [place] [address]  Phosphorus     Status: Abnormal   Collection Time: 10/01/17  8:14 AM  Result Value Ref Range   Phosphorus 2.4 (L) 2.5 - 4.6 mg/dL    Comment: Performed at [facility], [address] [place], [place] [address]  Glucose, capillary     Status: None   Collection Time: 10/01/17 12:06 PM  Result Value Ref Range   Glucose-Capillary 97 65 - 99 mg/dL  Glucose, capillary     Status: Abnormal   Collection Time: 10/01/17  4:41 PM  Result Value Ref Range   Glucose-Capillary 117 (H) 65 - 99 mg/dL   Comment 1 Notify RN     Current Facility-Administered Medications  Medication Dose Route Frequency Provider Last Rate Last Dose  . amiodarone (PACERONE) tablet 200 mg  200 mg Oral Daily Arta Silence, MD    200 mg at 10/01/17 1013  . aspirin EC tablet 325 mg  325 mg Oral Daily Arta Silence, MD   325 mg at 10/01/17 1013  . bisacodyl (DULCOLAX) EC tablet 5 mg  5 mg Oral Daily PRN Arta Silence, MD      . citalopram (CELEXA) tablet 20 mg  20 mg Oral Daily Arta Silence, MD   20 mg at 10/01/17 1013  . enoxaparin (LOVENOX) injection 40 mg  40 mg Subcutaneous Q24H  Arta Silence, MD      . folic acid (FOLVITE) tablet 1 mg  1 mg Oral Daily Arta Silence, MD   1 mg at 10/01/17 1013  . gabapentin (NEURONTIN) capsule 600 mg  600 mg Oral TID Arta Silence, MD   600 mg at 10/01/17 1509  . insulin aspart (novoLOG) injection 0-15 Units  0-15 Units Subcutaneous TID WC Sridharan, Prasanna, MD      . lidocaine (LIDODERM) 5 % 1 patch  1 patch Transdermal Q24H Arta Silence, MD   1 patch at 10/01/17 0636  . LORazepam (ATIVAN) injection 0-4 mg  0-4 mg Intravenous Q6H Arta Silence, MD   1 mg at 10/01/17 1816   Followed by  . [START ON 10/03/2017] LORazepam (ATIVAN) injection 0-4 mg  0-4 mg Intravenous Q12H Sridharan, Prasanna, MD      . LORazepam (ATIVAN) tablet 1 mg  1 mg Oral Q6H PRN Arta Silence, MD       Or  . LORazepam (ATIVAN) injection 1 mg  1 mg Intravenous Q6H PRN Arta Silence, MD      . metoprolol succinate (TOPROL-XL) 24 hr tablet 50 mg  50 mg Oral Daily Arta Silence, MD   50 mg at 10/01/17 1013  . multivitamin with minerals tablet 1 tablet  1 tablet Oral Daily Arta Silence, MD   1 tablet at 10/01/17 1012  . mycophenolate (CELLCEPT) capsule 1,000 mg  1,000 mg Oral QPM Sridharan, Prasanna, MD      . mycophenolate (CELLCEPT) capsule 500 mg  500 mg Oral Daily Arta Silence, MD   500 mg at 10/01/17 1510  . nicotine (NICODERM CQ - dosed in mg/24 hours) patch 21 mg  21 mg Transdermal Daily Mody, Sital, MD   21 mg at 10/01/17 1600  . ondansetron (ZOFRAN) tablet 4 mg  4 mg Oral Q6H PRN Arta Silence, MD       Or  . ondansetron  (ZOFRAN) injection 4 mg  4 mg Intravenous Q6H PRN Arta Silence, MD   4 mg at 10/01/17 1019  . oxyCODONE (Oxy IR/ROXICODONE) immediate release tablet 5 mg  5 mg Oral Q6H PRN Bettey Costa, MD   5 mg at 10/01/17 1816  . potassium chloride SA (K-DUR,KLOR-CON) CR tablet 40 mEq  40 mEq Oral BID Arta Silence, MD   40 mEq at 10/01/17 1014  . senna-docusate (Senokot-S) tablet 1 tablet  1 tablet Oral QHS PRN Arta Silence, MD      . thiamine (VITAMIN B-1) tablet 100 mg  100 mg Oral Daily Arta Silence, MD   100 mg at 10/01/17 1014   Or  . thiamine (B-1) injection 100 mg  100 mg Intravenous Daily Arta Silence, MD        Musculoskeletal: Strength & Muscle Tone: decreased Gait & Station: unable to stand Patient leans: N/A  Psychiatric Specialty Exam: Physical Exam  Nursing note and vitals reviewed. Constitutional: He appears well-developed. He appears distressed.  HENT:  Head: Normocephalic and atraumatic.  Eyes: Pupils are equal, round, and reactive to light. Conjunctivae are normal.  Neck: Normal range of motion.  Cardiovascular: Regular rhythm and normal heart sounds.  Respiratory: Effort normal.  GI: Soft.  Musculoskeletal: Normal range of motion.  Neurological: He is alert.  Skin: Skin is warm and dry.  Psychiatric: His affect is blunt. His speech is delayed. He is slowed. He expresses impulsivity. He expresses no suicidal ideation. He exhibits abnormal recent memory.    Review of Systems  Constitutional: Positive for malaise/fatigue  and weight loss.  HENT: Negative.   Eyes: Negative.   Respiratory: Negative.   Cardiovascular: Negative.   Gastrointestinal: Negative.   Musculoskeletal: Negative.   Skin: Negative.   Neurological: Positive for weakness.  Psychiatric/Behavioral: Positive for memory loss and substance abuse. Negative for depression, hallucinations and suicidal ideas. The patient is nervous/anxious and has insomnia.     Blood pressure  139/77, pulse 93, temperature 99.5 F (37.5 C), temperature source Oral, resp. rate 16, height '5\' 11"'$  (1.803 m), weight 79.4 kg (175 lb), SpO2 100 %.Body mass index is 24.41 kg/m.  General Appearance: Disheveled  Eye Contact:  Minimal  Speech:  Slow and Slurred  Volume:  Decreased  Mood:  Dysphoric  Affect:  Flat  Thought Process:  Disorganized  Orientation:  Full (Time, Place, and Person)  Thought Content:  Rumination  Suicidal Thoughts:  No  Homicidal Thoughts:  No  Memory:  Immediate;   Fair Recent;   Poor Remote;   Fair  Judgement:  Impaired  Insight:  Lacking  Psychomotor Activity:  Decreased  Concentration:  Concentration: Poor  Recall:  Poor  Fund of Knowledge:  Poor  Language:  Poor  Akathisia:  No  Handed:  Right  AIMS (if indicated):     Assets:  Desire for Improvement Housing  ADL's:  Impaired  Cognition:  Impaired,  Mild  Sleep:        Treatment Plan Summary: Medication management and Plan 57 year old man with alcohol abuse back in the hospital having detox.  Patient knows that he is drinking himself to death.  He has been unable to follow up with the appropriate behavior and now his health is failing to the point where it is getting more and more impossible for him to do so.  I am not sure what can be done really to help him to stop drinking that is not already being done.  As far as detox the medication should be appropriate.  We will continue to monitor and make sure he does not get delirious or more agitated.  Disposition: No evidence of imminent risk to self or others at present.   Patient does not meet criteria for psychiatric inpatient admission. Supportive therapy provided about ongoing stressors.  Alethia Berthold, MD 10/01/2017 7:38 PM

## 2017-10-01 NOTE — ED Notes (Signed)
Report given to Jessica RN

## 2017-10-01 NOTE — ED Notes (Signed)
lidoderm patch applied per pt's instructions, across his lower back partially down on his "crack";

## 2017-10-01 NOTE — ED Notes (Signed)
Pt given diet ginger ale as requested 

## 2017-10-01 NOTE — ED Notes (Signed)
Assist x 2 back to stretcher with pt also using cane; pt became very unsteady on his feet and stumbled onto bed; shaking uncontrollably; asked pt what he does when he's at home and he gets this bad, stated he just lays in bed; when asked about toileting, pt would not answer;

## 2017-10-01 NOTE — ED Notes (Signed)
Pt more awake and alert; given sandwich tray and more ginger ale; asked pt to be honest about when he last drank; says maybe he did drink on Sunday but he doesn't recall; says he doesn't think he drinks to blackouts, but he does drink for pain control so he can sleep; pt understands the plan is for him to stay the night and go to detox tomorrow; at this time pt is willing to do so;

## 2017-10-01 NOTE — ED Notes (Signed)
Attempted to call report to the floor.  RN states she will call back as she is getting report from night shift.

## 2017-10-01 NOTE — Progress Notes (Signed)
Patient briefly seen and examined.  Patient here for EtOH withdrawal.  Patient with a history of diabetes, chronic anemia, glycemia on CellCept. History of cardiomyopathy EF of 40% and diastolic dysfunction Agree with current plan as ordered by admitting MD.  Tobacco dependence: Patient is encouraged to quit smoking. Counseling was provided for 4 minutes.   At psych consult

## 2017-10-01 NOTE — ED Notes (Signed)
Answered pt's call bell; stand placed at bedside to rest his drink on, per his request

## 2017-10-01 NOTE — ED Notes (Signed)
Pt given lemon swabs for mouth and another diet ginger ale;

## 2017-10-01 NOTE — ED Notes (Signed)
Answering pt's call bell; needs to void and have a bowel movement; pt assisted x 2 after moving stretcher right beside toilet; pt still very unsteady on his feet; Dr Dolores Frame in to watch pt transfer with 2 assist back to stretcher; decision made to medically admit pt; intake specialist informed pt will not need out patient detox today;

## 2017-10-01 NOTE — ED Notes (Signed)
Pt assisted x 2 to toilet in room, bed moved closed and pt also used his cane; urine specimen collected;

## 2017-10-01 NOTE — ED Notes (Signed)
Pt sleeping, hr 88, no tremors felt finger tip to fingertip while pt sleeping.

## 2017-10-01 NOTE — ED Notes (Signed)
In to answer pt's call bell; he states he's awake and "dry as a bone"; given diet ginger ale to drink; appreciative; pt says his attends is dry but may try to use a urinal soon; one placed in reach of pt; side rails remain up with call bell in reach

## 2017-10-01 NOTE — ED Notes (Signed)
Answered pt's call bell; asking if it's time for more ativan; will discuss with MD

## 2017-10-01 NOTE — ED Provider Notes (Signed)
-----------------------------------------   5:33 AM on 10/01/2017 -----------------------------------------  Patient with increasing need for Ativan per CIWA scale.  Currently patient is drenched in sweats.  Extremely shaky and unsteady going from commode to stretcher with heavy assistance.  From a safety standpoint, patient would not be able to go to outpatient detox today.  Discussed with hospitalist to evaluate patient in the emergency department for admission.   Irean Hong, MD 10/01/17 [phone]

## 2017-10-01 NOTE — ED Notes (Signed)
Answered pt's call bell; HOB lowered as requested;

## 2017-10-01 NOTE — ED Notes (Signed)
TTS contacted [facility] for detox placement.  No beds were available tonight.  Open bed should be available tomorrow.

## 2017-10-01 NOTE — ED Notes (Signed)
Pt given tissue to rub his eyes, says they're burning; ativan given per CIWA scale; IV site unremarkable

## 2017-10-01 NOTE — ED Notes (Signed)
Call bell answered by ED tech Genelle Bal; given more ginger ale;

## 2017-10-01 NOTE — Clinical Social Work Note (Signed)
Clinical Social Work Assessment  Patient Details  Name: Kurt Horton MRN: [id number] Date of Birth: November 03, 1960  Date of referral:  10/01/17               Reason for consult:  [employer], Substance Use/ETOH Abuse                Permission sought to share information with:    Permission granted to share information::     Name::        Agency::     Relationship::     Contact Information:     Housing/Transportation Living arrangements for the past 2 months:  Single Family Home Source of Information:  Patient Patient Interpreter Needed:  None Criminal Activity/Legal Involvement Pertinent to Current Situation/Hospitalization:  No - Comment as needed Significant Relationships:  Parents Lives with:  Parents Do you feel safe going back to the place where you live?  Yes Need for family participation in patient care:  Yes (Comment)  Care giving concerns:  Patient lives in [place] with his parents Kurt Horton and Kurt Horton.    Social Worker assessment / plan:  Holiday representative (CSW) reviewed chart and noted that patient was admitted to [facility] for alcohol withdrawal. CSW met with patient alone at bedside to offer resources. Patient was laying in bed and was alert and oriented X4. CSW introduced self and explained role of CSW department. Patient reported that he is feeling better today. Per patient he is on disability and receives around $2,600 per month in SSI. Per patient he does not work or drive and usually takes uber. Per patient he lives with his parents and drinks liquor daily. Per patient he has been drinking liquor for 5 years and was drinking beer prior to that. Patient reported that he has 2 adult children, his son and daughter. Patient reported that he is close with his children and just went to his son's wedding. Patient reported that he is ready to stop drinking and is interested in counseling. CSW provided patient with a list of outpatient substance abuse services including [facility]  and [facility]. Patient accepted resources and thanked CSW for assistance. CSW will continue to follow and assist as needed.   Employment status:  Disabled (Comment on whether or not currently receiving Disability) Insurance information:  Medicaid In [place] PT Recommendations:  Not assessed at this time Information / Referral to community resources:  Outpatient Substance Abuse Treatment Options  Patient/Family's Response to care:  Patient accepted substance abuse resources.   Patient/Family's Understanding of and Emotional Response to Diagnosis, Current Treatment, and Prognosis:  Patient appears motivated to stop drinking alcohol.   Emotional Assessment Appearance:  Appears stated age Attitude/Demeanor/Rapport:    Affect (typically observed):  Accepting, Adaptable, Pleasant Orientation:  Oriented to Self, Oriented to Place, Oriented to  Time, Oriented to Situation Alcohol / Substance use:  Alcohol Use Psych involvement (Current and /or in the community):  No (Comment)  Discharge Needs  Concerns to be addressed:  Discharge Planning Concerns Readmission within the last 30 days:  No Current discharge risk:  Substance Abuse Barriers to Discharge:  Continued Medical Work up   [employer], Veronia Beets, LCSW 10/01/2017, 12:36 PM

## 2017-10-01 NOTE — H&P (Addendum)
[facility] [place] at [facility]   PATIENT NAME: Kurt Horton    MR#:  [id number]  DATE OF BIRTH:  10-06-1960  DATE OF ADMISSION:  09/30/2017  PRIMARY CARE PHYSICIAN: [name], MD   REQUESTING/REFERRING PHYSICIAN: Irean Hong, MD  CHIEF COMPLAINT:   Chief Complaint  Patient presents with  . Delirium Tremens (DTS)    HISTORY OF PRESENT ILLNESS:  Kurt Horton  is a 57 y.o. male with a known history of T2NIDDM, Afib (no AC), myasthenia (on CellCept), EtOH abuse p/w 1d Hx EtOH withdrawal. Presented to ED @~1900 on 05/26, tremulous and in early withdrawal. EtOH level was 341. After 10hrs, pt continues to be diaphoretic and tremulous, necessitating ongoing withdrawal treatment. Pt states he is thirsty, anxious and endorses chronic back pain, but is otherwise w/o complaint. He states he has been drinking 10+ small airline bottles of rum per day for at least 43yrs. His last drink was yesterday. He cites divorce as a precipitant. He states he has not taken his home medications for 2 days. He was not aware that alcohol can potentially alter CellCept metabolism/levels. As noted, pt is diaphoretic and tremulous, but is not tachycardic or hypertensive.  PAST MEDICAL HISTORY:   Past Medical History:  Diagnosis Date  . A-fib (HCC)   . Alcohol abuse   . Alcohol withdrawal (HCC) 11/12/2016  . Diabetes mellitus without complication (HCC)   . DJD (degenerative joint disease)   . Hypertension   . Myasthenia gravis (HCC)   . Renal disorder     PAST SURGICAL HISTORY:   Past Surgical History:  Procedure Laterality Date  . AMPUTATION TOE Left 12/29/2015   Procedure: AMPUTATION TOE;  Surgeon: Gwyneth Revels, DPM;  Location: [facility] ORS;  Service: Podiatry;  Laterality: Left;  . PERIPHERAL VASCULAR CATHETERIZATION N/A 12/27/2015   Procedure: Lower Extremity Angiography;  Surgeon: Annice Needy, MD;  Location: [facility] INVASIVE CV LAB;  Service: Cardiovascular;  Laterality: N/A;  .  right hip surgery    . right shoulder surgery      SOCIAL HISTORY:   Social History   Tobacco Use  . Smoking status: Current Every Day Smoker    Packs/day: 0.50    Types: Cigarettes  . Smokeless tobacco: Never Used  Substance Use Topics  . Alcohol use: Yes    Comment: Pt had tequila and rum, at least a 5th or a pint a day    FAMILY HISTORY:   Family History  Problem Relation Age of Onset  . Rheum arthritis Father     DRUG ALLERGIES:   Allergies  Allergen Reactions  . Other Other (See Comments)    Patient states he can't take any "mycin" medications because myasthenia gravis.    REVIEW OF SYSTEMS:   Review of Systems  Constitutional: Positive for diaphoresis and malaise/fatigue. Negative for chills, fever and weight loss.  HENT: Negative for congestion, ear pain, hearing loss, nosebleeds, sinus pain, sore throat and tinnitus.   Eyes: Negative for blurred vision, double vision and photophobia.  Respiratory: Negative for cough, hemoptysis, sputum production, shortness of breath and wheezing.   Cardiovascular: Negative for chest pain, palpitations, orthopnea, claudication, leg swelling and PND.  Gastrointestinal: Negative for abdominal pain, blood in stool, constipation, diarrhea, heartburn, melena, nausea and vomiting.  Genitourinary: Negative for dysuria, frequency, hematuria and urgency.  Musculoskeletal: Positive for back pain. Negative for falls, joint pain, myalgias and neck pain.  Skin: Negative for itching and rash.  Neurological: Positive for tremors  and weakness. Negative for dizziness, tingling, sensory change, speech change, focal weakness, seizures, loss of consciousness and headaches.  Psychiatric/Behavioral: Negative for memory loss. The patient is nervous/anxious. The patient does not have insomnia.     MEDICATIONS AT HOME:   Prior to Admission medications   Medication Sig Start Date End Date Taking? Authorizing Provider  amiodarone (PACERONE) 200 MG  tablet Take 1 tablet (200 mg total) by mouth daily. 08/25/17  Yes Auburn Bilberry, MD  aspirin EC 325 MG EC tablet Take 1 tablet (325 mg total) by mouth daily. 08/18/17  Yes Auburn Bilberry, MD  B Complex-C (B-COMPLEX WITH VITAMIN C) tablet Take 1 tablet by mouth daily.   Yes [provider]  chlordiazePOXIDE (LIBRIUM) 5 MG capsule Take 1 capsule (5 mg total) by mouth 3 (three) times daily as needed for anxiety. 08/17/17  Yes Auburn Bilberry, MD  citalopram (CELEXA) 20 MG tablet Take 1 tablet (20 mg total) by mouth daily. 08/24/16  Yes Sater, Pearletha Furl, MD  furosemide (LASIX) 40 MG tablet Take 40 mg by mouth daily as needed for fluid (if taking prednisone).    Yes [provider]  gabapentin (NEURONTIN) 300 MG capsule Take 1 capsule (300 mg total) by mouth 3 (three) times daily. Patient taking differently: Take 600 mg 3 (three) times daily by mouth.  03/01/17  Yes Enid Baas, MD  metFORMIN (GLUCOPHAGE) 500 MG tablet Take 1 tablet (500 mg total) by mouth daily. 12/30/15  Yes Wieting, Richard, MD  metoprolol succinate (TOPROL-XL) 50 MG 24 hr tablet Take 1 tablet (50 mg total) by mouth daily. Take with or immediately following a meal. 08/18/17  Yes Auburn Bilberry, MD  multivitamin (ONE-A-DAY MEN'S) TABS tablet Take 1 tablet by mouth daily. 08/18/17  Yes Wieting, Richard, MD  mycophenolate (CELLCEPT) 500 MG tablet Take 1 tablet ( ) by mouth every morning and 2 tablets ( ) by mouth every evening   Yes [provider]  potassium chloride (K-DUR) 10 MEQ tablet Take 10 mEq by mouth daily. 06/21/17 06/21/18 Yes [provider]      VITAL SIGNS:  Blood pressure 138/76, pulse (!) 101, temperature 99 F (37.2 C), temperature source Oral, resp. rate (!) 37, height  (1.803 m), weight 79.4 kg (175 lb), SpO2 98 %.  PHYSICAL EXAMINATION:  Physical Exam  Constitutional: He is oriented to person, place, and time. He appears well-developed and well-nourished. He  is active and cooperative.  Non-toxic appearance. He does not have a sickly appearance. He does not appear ill. No distress. He is not intubated.  HENT:  Head: Normocephalic and atraumatic.  Mouth/Throat: No oropharyngeal exudate.  Eyes: Conjunctivae, EOM and lids are normal. No scleral icterus.  Neck: Neck supple. No JVD present. No thyromegaly present.  Cardiovascular: Normal rate, regular rhythm, S1 normal, S2 normal and normal heart sounds.  No extrasystoles are present. Exam reveals no gallop, no S3, no S4, no distant heart sounds and no friction rub.  No murmur heard. Pulmonary/Chest: Effort normal and breath sounds normal. No accessory muscle usage or stridor. No apnea, no tachypnea and no bradypnea. He is not intubated. No respiratory distress. He has no decreased breath sounds. He has no wheezes. He has no rhonchi. He has no rales.  Abdominal: Soft. Bowel sounds are normal. He exhibits no distension. There is no tenderness. There is no rebound and no guarding.  Musculoskeletal: Normal range of motion. He exhibits edema. He exhibits no tenderness.  Lymphadenopathy:    He has no cervical  adenopathy.  Neurological: He is alert and oriented to person, place, and time. He is not disoriented. He displays tremor.  Skin: Skin is warm and intact. No rash noted. He is diaphoretic. No erythema.  Psychiatric: His speech is normal and behavior is normal. Judgment and thought content normal. His mood appears anxious. His affect is not angry, not blunt, not labile and not inappropriate. He is not agitated, not aggressive, not hyperactive, not slowed, not withdrawn, not actively hallucinating and not combative. Thought content is not paranoid and not delusional. Cognition and memory are normal. Cognition and memory are not impaired. He does not express impulsivity or inappropriate judgment. He does not exhibit a depressed mood. He is attentive.   Tremulous. Diaphoretic. Trace/1+ B/L LE edema.  LABORATORY  PANEL:   CBC Recent Labs  Lab 09/30/17 1919  WBC 5.5  HGB 12.5*  HCT 39.4*  PLT 160   ------------------------------------------------------------------------------------------------------------------  Chemistries  Recent Labs  Lab 09/30/17 1919  NA 138  K 3.4*  CL 97*  CO2 27  GLUCOSE 191*  BUN 9  CREATININE 0.66  CALCIUM 8.3*  AST 57*  ALT 33  ALKPHOS 91  BILITOT 0.6   ------------------------------------------------------------------------------------------------------------------  Cardiac Enzymes No results for input(s): TROPONINI in the last 168 hours. ------------------------------------------------------------------------------------------------------------------  RADIOLOGY:  Dg Chest Portable 1 View  Result Date: 09/30/2017 CLINICAL DATA:  Altered mental status and ethanol use today. Withdrawal. EXAM: PORTABLE CHEST 1 VIEW COMPARISON:  08/13/2017 FINDINGS: Mild S-shaped thoracolumbar spine curvature. Midline trachea. Borderline cardiomegaly. No pleural effusion or pneumothorax. Mild right hemidiaphragm elevation. Mild biapical pleural thickening. Clear lungs. IMPRESSION: No acute cardiopulmonary disease. Electronically Signed   By: Jeronimo Greaves M.D.   On: 09/30/2017 19:19   IMPRESSION AND PLAN:   A/P: 60M EtOH withdrawal, hypokalamia, T2NIDDM w/ hyperglycemia + glycosuria/proteinuria + neuropathy, transaminasemia, microcytic anemia, chronic back pain. -Med/Surg w/ Telemetry box. VSS -CIWA protocol, Ativan PRN, consider supplementation w/ Valium/Librium if scores persistently high -Thiamine, folate, MVI -Hold Tylenol -K+ 3.4, replete -Magnesium, phosphorus levels pending -SSI, hold Metformin -AST mildly elevated (57), likely 2/2 EtOH use, < 2-3x ULN, f/up -Level of intrinsic liver disease unknown -Hgb 12.5, MCV 76.8. Suspect iron deficiency anemia, though this may also potentially be due to CellCept use. Outpt iron studies and repletion as  appropriate -Lidoderm patch, low-dose Oxycodone for back pain -Not on systemic AC, on ASA325 qD for Afib (cont'd) -CellCept level pending -Encourage PO fluids. Hx cardiomyopathy, EF 40% w/ grade 1 DD (02/2017 Echo), avoiding further IVF -Seems at least superficially motivated to detox/rehab from EtOH -c/w home meds -Cardiac diabetic diet -Lovenox -Full code -Admission, > 2 midnights   All the records are reviewed and case discussed with ED provider. Management plans discussed with the patient, family and they are in agreement.  CODE STATUS: Full code  TOTAL TIME TAKING CARE OF THIS PATIENT: 90 minutes.    Barbaraann Rondo M.D on 10/01/2017 at 6:07 AM  Between 7am to 6pm - Pager - [phone]  After 6pm go to [email or web address] - Social research officer, government  [facility] [place] [facility]  Office  [phone]  CC: Primary care physician; [name], MD   Note: This dictation was prepared with Dragon dictation along with smaller phrase technology. Any transcriptional errors that result from this process are unintentional.

## 2017-10-01 NOTE — ED Notes (Signed)
hospitalist in to see pt.

## 2017-10-01 NOTE — ED Notes (Signed)
Answered pt's call bell; requesting to brush his teeth;

## 2017-10-02 ENCOUNTER — Other Ambulatory Visit: Payer: Self-pay | Admitting: Psychiatry

## 2017-10-02 LAB — BASIC METABOLIC PANEL
ANION GAP: 10 (ref 5–15)
BUN: 8 mg/dL (ref 6–20)
CALCIUM: 9.1 mg/dL (ref 8.9–10.3)
CO2: 23 mmol/L (ref 22–32)
Chloride: 103 mmol/L (ref 101–111)
Creatinine, Ser: 0.7 mg/dL (ref 0.61–1.24)
Glucose, Bld: 97 mg/dL (ref 65–99)
Potassium: 3.7 mmol/L (ref 3.5–5.1)
Sodium: 136 mmol/L (ref 135–145)

## 2017-10-02 LAB — GLUCOSE, CAPILLARY
GLUCOSE-CAPILLARY: 99 mg/dL (ref 65–99)
GLUCOSE-CAPILLARY: 131 mg/dL — AB (ref 65–99)
Glucose-Capillary: 146 mg/dL — ABNORMAL HIGH (ref 65–99)
Glucose-Capillary: 131 mg/dL — ABNORMAL HIGH (ref 65–99)

## 2017-10-02 LAB — MAGNESIUM: Magnesium: 1.6 mg/dL — ABNORMAL LOW (ref 1.7–2.4)

## 2017-10-02 MED ORDER — LORAZEPAM 1 MG PO TABS
1.0000 mg | ORAL_TABLET | ORAL | Status: DC | PRN
  Administered 2017-10-02 – 2017-10-04 (×2): 1 mg via ORAL
  Filled 2017-10-02 (×2): qty 1

## 2017-10-02 MED ORDER — OXYCODONE HCL 5 MG PO TABS
5.0000 mg | ORAL_TABLET | ORAL | Status: DC | PRN
  Administered 2017-10-02 – 2017-10-03 (×5): 5 mg via ORAL
  Filled 2017-10-02 (×5): qty 1

## 2017-10-02 MED ORDER — PREMIER PROTEIN SHAKE
11.0000 [oz_av] | Freq: Two times a day (BID) | ORAL | Status: DC
  Administered 2017-10-03 (×2): 11 [oz_av] via ORAL

## 2017-10-02 MED ORDER — METOPROLOL SUCCINATE ER 50 MG PO TB24
75.0000 mg | ORAL_TABLET | Freq: Every day | ORAL | Status: DC
  Administered 2017-10-02 – 2017-10-03 (×2): 75 mg via ORAL
  Filled 2017-10-02 (×3): qty 2

## 2017-10-02 MED ORDER — LORAZEPAM 2 MG/ML IJ SOLN
1.0000 mg | Freq: Four times a day (QID) | INTRAMUSCULAR | Status: DC | PRN
  Administered 2017-10-02 – 2017-10-04 (×2): 1 mg via INTRAVENOUS
  Filled 2017-10-02: qty 1

## 2017-10-02 MED ORDER — METOPROLOL TARTRATE 25 MG PO TABS: 25.0000 mg | ORAL_TABLET | Freq: Two times a day (BID) | ORAL | Status: DC

## 2017-10-02 MED ORDER — METOPROLOL SUCCINATE ER 50 MG PO TB24: 100.0000 mg | ORAL_TABLET | Freq: Every day | ORAL | Status: DC

## 2017-10-02 MED ORDER — LOPERAMIDE HCL 2 MG PO CAPS
2.0000 mg | ORAL_CAPSULE | Freq: Four times a day (QID) | ORAL | Status: DC | PRN
  Administered 2017-10-02 – 2017-10-03 (×3): 2 mg via ORAL
  Filled 2017-10-02 (×5): qty 1

## 2017-10-02 NOTE — Progress Notes (Addendum)
Per Georgia Lopes with [facility] patient did contact him however patient told Gwyndolyn Saxon that he can't work. Per Gwyndolyn Saxon in order to be in the [facility] program you have to be able to work. Per patient he can't work and is on disability. Patient reported that he also called [facility] in [place] [facility] and was told to call back at 7 pm tonight. CSW also left a Advertising account executive for [employer] in [place].   CSW received a call back from Legrand Como at [employer] and asked CSW to help patient complete online application. CSW met with patient in his room and used the computer at bedside to help him complete the online application. Patient gave CSW permission to type in his answers to the application. Application was completed and submitted. Per Legrand Como he instructed patient to call back at 7 pm tonight, which patient has agreed to do.   [employer], LCSW [phone]

## 2017-10-02 NOTE — Progress Notes (Signed)
Pharmacy consulted for electrolyte replacement protocol:   Goal of therapy: Electrolytes within normal limits:  K 3.5 - 5.1 Corrected Ca 8.9 - 10.3 Phos 2.5 - 4.6 Mg 1.7 - 2.4   Assessment: Lab Results  Component Value Date   CREATININE 0.70 10/02/2017   BUN 8 10/02/2017   NA 136 10/02/2017   K 3.7 10/02/2017   CL 103 10/02/2017   CO2 23 10/02/2017    Plan: Labs WNL, will recheck with AM labs.    Luan Pulling, PharmD, MBA, [employer] Clinical Pharmacist [facility]

## 2017-10-02 NOTE — Progress Notes (Signed)
[facility] [employer] at [facility]   PATIENT NAME: Kurt Horton    MR#:  [id number]  DATE OF BIRTH:  01/07/1961  SUBJECTIVE:   patient with loose stools and tremors no hallucinations C/o back pain  REVIEW OF SYSTEMS:    Review of Systems  Constitutional: Negative for fever, chills weight loss HENT: Negative for ear pain, nosebleeds, congestion, facial swelling, rhinorrhea, neck pain, neck stiffness and ear discharge.   Respiratory: Negative for cough, shortness of breath, wheezing  Cardiovascular: Negative for chest pain, palpitations and leg swelling.  Gastrointestinal: Negative for heartburn, abdominal pain, vomiting, ++diarrhea or noconsitpation Genitourinary: Negative for dysuria, urgency, frequency, hematuria Musculoskeletal: Negative for back pain or joint pain Neurological: Negative for dizziness, seizures, syncope, focal weakness,  numbness and headaches.  +tremors Hematological: Does not bruise/bleed easily.  Psychiatric/Behavioral: Negative for hallucinations, confusion, dysphoric mood    Tolerating Diet: yes      DRUG ALLERGIES:   Allergies  Allergen Reactions  . Other Other (See Comments)    Patient states he can't take any "mycin" medications because myasthenia gravis.    VITALS:  Blood pressure (!) 143/82, pulse 97, temperature 98.5 F (36.9 C), temperature source Oral, resp. rate 18, height  (1.803 m), weight 79.4 kg (175 lb), SpO2 98 %.  PHYSICAL EXAMINATION:  Constitutional: Appears well-developed and well-nourished. No distress. HENT: Normocephalic. Marland Kitchen Oropharynx is clear and moist.  Eyes: Conjunctivae and EOM are normal. PERRLA, no scleral icterus.  Neck: Normal ROM. Neck supple. No JVD. No tracheal deviation. CVS: RRR, S1/S2 +, no murmurs, no gallops, no carotid bruit.  Pulmonary: Effort and breath sounds normal, no stridor, rhonchi, wheezes, rales.  Abdominal: Soft. BS +,  no distension, tenderness, rebound or guarding.   Musculoskeletal: Normal range of motion. No edema and no tenderness.  Neuro: Alert. CN 2-12 grossly intact. No focal deficits. Skin: Skin is warm and dry. No rash noted. Psychiatric: Normal mood and affect.  tremulous    LABORATORY PANEL:   CBC Recent Labs  Lab 09/30/17 1919  WBC 5.5  HGB 12.5*  HCT 39.4*  PLT 160   ------------------------------------------------------------------------------------------------------------------  Chemistries  Recent Labs  Lab 09/30/17 1919  10/02/17 0429  NA 138  --  136  K 3.4*  --  3.7  CL 97*  --  103  CO2 27  --  23  GLUCOSE 191*  --  97  BUN 9  --  8  CREATININE 0.66  --  0.70  CALCIUM 8.3*  --  9.1  MG  --    < > 1.6*  AST 57*  --   --   ALT 33  --   --   ALKPHOS 91  --   --   BILITOT 0.6  --   --    < > = values in this interval not displayed.   ------------------------------------------------------------------------------------------------------------------  Cardiac Enzymes No results for input(s): TROPONINI in the last 168 hours. ------------------------------------------------------------------------------------------------------------------  RADIOLOGY:  Dg Chest Portable 1 View  Result Date: 09/30/2017 CLINICAL DATA:  Altered mental status and ethanol use today. Withdrawal. EXAM: PORTABLE CHEST 1 VIEW COMPARISON:  08/13/2017 FINDINGS: Mild S-shaped thoracolumbar spine curvature. Midline trachea. Borderline cardiomegaly. No pleural effusion or pneumothorax. Mild right hemidiaphragm elevation. Mild biapical pleural thickening. Clear lungs. IMPRESSION: No acute cardiopulmonary disease. Electronically Signed   By: Jeronimo Greaves M.D.   On: 09/30/2017 19:19     ASSESSMENT AND PLAN:   57 y/o male with Hc of Etoh dependence  presented to ED with Etoh intoxication and weakness.  1. Etoh abuse: Patient counseled and seen CSW and psych Continue CIWA  2. Elevated BP from withdrawal: Increase dose of metoprolol and  follow  3. HypoMg: replete  4. Depression: celexa 5. MG: continue cellcept 6. Tobacco dependence: Patient is encouraged to quit smoking. Counseling was provided. Continue patch  7. PAF: continue metoprolol currently NSR  8. Diarrhea from detox imodium     Management plans discussed with the patient and he is in agreement.  CODE STATUS: full  TOTAL TIME TAKING CARE OF THIS PATIENT: 30 minutes.     POSSIBLE D/C 3-4 days, DEPENDING ON CLINICAL CONDITION.   Norma Montemurro M.D on 10/02/2017 at 11:47 AM  Between 7am to 6pm - Pager - [phone] After 6pm go to [email or web address] - password [email or web address]  [facility] [place] [facility]  Office  [phone]  CC: Primary care physician; [name], MD  Note: This dictation was prepared with Dragon dictation along with smaller phrase technology. Any transcriptional errors that result from this process are unintentional.

## 2017-10-02 NOTE — Progress Notes (Signed)
Clinical Education officer, museum (CSW) was approached by patient's aunt Dorian Pod outside of patient's room. Per Dorian Pod patient can't come home to his parents house and they want him placed in alcohol rehab. CSW explained that patient would have to be agreeable to that and that said his parents would have to make patient aware of that.   CSW met with patient alone at bedside. Patient reported that his parents are kicking him out and won't let him come home. CSW explained that a referral can be sent to inpatient substance abuse rehab at [facility]. Per patient he has been to [facility] before and does not want to go back there. Patient reported that he wants to go to a residential treatment center. Per patient his pastor is helping him make phone calls to residential treatment places. CSW provided patient with substance abuse treatment options. CSW also contact Georgia Lopes with [employer]. Gwyndolyn Saxon said he would be willing to talk with patient directly about the program but he requested that patient call him. CSW provided patient with [facility] contact information. Patient reported that if he is unable to secure a bed at residential treatment then he will stay in a hotel for a few days. Patient reported that he has enough money to pay for a hotel and does not want to go to the homeless shelter.    [employer], LCSW [phone]

## 2017-10-02 NOTE — Progress Notes (Addendum)
Initial Nutrition Assessment  DOCUMENTATION CODES:   Non-severe (moderate) malnutrition in context of social or environmental circumstances  INTERVENTION:  Provide Premier Protein po BID, each supplement provides 160 kcal and 30 grams of protein.  Provide bedtime snack.  Patient has signs of both wet and dry beriberi and is at risk for Wernicke's Encephalopathy. Recommend providing thiamine 500 mg IV, infused over 30 minutes, three times daily for 2 consecutive days followed by thiamine 250 mg IV or IM for 5 days. Can then provide thiamine 100 mg daily PO (to also be continued after discharge).  Continue MVI daily and folic acid 1 mg daily (continue after discharge).  Monitor magnesium, potassium, and phosphorus daily for at least 3 days, MD to replete as needed, as pt is at risk for refeeding syndrome given hx of EtOH abuse. Discussed with pharmacist as they are consulted for electrolyte replacement.  NUTRITION DIAGNOSIS:   Moderate Malnutrition related to social / environmental circumstances(EtOH abuse) as evidenced by mild fat depletion, mild muscle depletion, energy intake < or equal to 50% for > or equal to 1 month.  GOAL:   Patient will meet greater than or equal to 90% of their needs  MONITOR:   PO intake, Supplement acceptance, Labs, Weight trends, I & O's  REASON FOR ASSESSMENT:   Malnutrition Screening Tool    ASSESSMENT:   57 year old male with PMHx of DM, HTN, myasthenia gravis, A-fib, EtOH abuse, degenerative joint disease, cardiomyopathy, diastolic dysfunction who was admitted with EtOH intoxication currently on CIWA, weakness, also with elevated BP from withdrawal, and diarrhea.   Met with patient at bedside. He reports his appetite is okay here, but it has been poor PTA. He reports drinking rum heavily (10-20 shots per day) for years now, so he does not eat much during the day. He only has one meal per day. It is usually a 12" sub from [employer]. He either gets the  ham and [place] or [place] BMT with lettuce, tomato, and black olives. That is a daily intake of approximately 820 kcal per day (41% minimum estimated kcal needs). Patient takes a daily MVI but does not take additional folic acid or thiamine at home. He reports he has been having issues with neuropathy and unsteady gait lately. The way he is describing his gait sounds like it is ataxic. Patient is very concerned about not being able to walk again and has requested to work with PT. RN reports PT has already worked with patient. No note in chart at time of RD assessment and did not see an active order for PT. Patient reports he is eating fairly well here and is finishing most of his meals. He is requesting a bedtime snack because he gets very hungry a few hours after dinner and it affects his ability to sleep. He is also amenable to drinking Premier to help meet protein needs. He reports he is having loose bowel movements.  Patient report his weight fluctuates between 175-195 lbs. He reports he has not been weighed yet this admission. Current weight appears to be a stated weight. RD unable to weigh patient as he was sitting in chair and is reporting an unsteady gait. Per chart he was 185.2 lbs on 06/04/2017 and 174.4 lbs on 08/24/2017. That is a weight loss of 10.8 lbs (5.8% body weight) over 3 months, which is not significant for time frame.  Meal Completion: 95-100%  Medications reviewed and include: amiodarone, folic acid 1 mg daily PO, gabapentin, Novolog  0-15 units TID, Ativan, MVI daily PO, Cellcept, thiamine 100 mg daily PO or IV (PO being given).  Labs reviewed: CBG 99-131, Magnesium 1.6.  Discussed with RN.  NUTRITION - FOCUSED PHYSICAL EXAM:    Most Recent Value  Orbital Region  Mild depletion  Upper Arm Region  Moderate depletion  Thoracic and Lumbar Region  Mild depletion  Buccal Region  Mild depletion  Temple Region  Mild depletion  Clavicle Bone Region  Mild depletion  Clavicle and  Acromion Bone Region  Mild depletion  Scapular Bone Region  Mild depletion  Dorsal Hand  No depletion  Patellar Region  No depletion  Anterior Thigh Region  No depletion  Posterior Calf Region  No depletion  Edema (RD Assessment)  None  Hair  Reviewed  Eyes  Reviewed  Mouth  Reviewed  Skin  Reviewed  Nails  Reviewed     Diet Order:   Diet Order           Diet heart healthy/carb modified Room service appropriate? Yes; Fluid consistency: Thin  Diet effective now          EDUCATION NEEDS:   Education needs have been addressed  Skin:  Skin Assessment: Reviewed RN Assessment  Last BM:  10/02/2017 - small type 5  Height:   Ht Readings from Last 1 Encounters:  09/30/17 '5\' 11"'$  (1.803 m)    Weight:   Wt Readings from Last 1 Encounters:  09/30/17 175 lb (79.4 kg)    Ideal Body Weight:  78.2 kg  BMI:  Body mass index is 24.41 kg/m.  Estimated Nutritional Needs:   Kcal:  1980-2145 (MSJ x 1.2-1.3)  Protein:  95-110 grams (1.2-1.4 grams/kg)  Fluid:  1.9-2.1 L/day (1 mL/kcal)  Willey Blade, MS, RD, LDN Office: [phone] Pager: [phone] After Hours/Weekend Pager: [phone]

## 2017-10-03 LAB — BASIC METABOLIC PANEL
Anion gap: 13 (ref 5–15)
BUN: 16 mg/dL (ref 6–20)
CHLORIDE: 101 mmol/L (ref 101–111)
CO2: 21 mmol/L — ABNORMAL LOW (ref 22–32)
CREATININE: 0.75 mg/dL (ref 0.61–1.24)
Calcium: 9.1 mg/dL (ref 8.9–10.3)
GFR calc Af Amer: >60 mL/min (ref >60)
GFR calc non Af Amer: >60 mL/min (ref >60)
Glucose, Bld: 110 mg/dL — ABNORMAL HIGH (ref 65–99)
POTASSIUM: 3.7 mmol/L (ref 3.5–5.1)
SODIUM: 135 mmol/L (ref 135–145)

## 2017-10-03 LAB — GLUCOSE, CAPILLARY
GLUCOSE-CAPILLARY: 127 mg/dL — AB (ref 65–99)
GLUCOSE-CAPILLARY: 124 mg/dL — AB (ref 65–99)
Glucose-Capillary: 112 mg/dL — ABNORMAL HIGH (ref 65–99)
Glucose-Capillary: 118 mg/dL — ABNORMAL HIGH (ref 65–99)

## 2017-10-03 LAB — MYCOPHENOLIC ACID (CELLCEPT)
MPA Glucuronide: NOT DETECTED ug/mL (ref 15–125)
MPA: NOT DETECTED ug/mL (ref 1.0–3.5)

## 2017-10-03 LAB — PHOSPHORUS: Phosphorus: 3.4 mg/dL (ref 2.5–4.6)

## 2017-10-03 LAB — MAGNESIUM: MAGNESIUM: 1.8 mg/dL (ref 1.7–2.4)

## 2017-10-03 MED ORDER — OXYCODONE HCL 5 MG PO TABS
10.0000 mg | ORAL_TABLET | ORAL | Status: DC | PRN
  Administered 2017-10-03 – 2017-10-04 (×5): 10 mg via ORAL
  Filled 2017-10-03 (×5): qty 2

## 2017-10-03 NOTE — Progress Notes (Signed)
Pharmacy consulted for electrolyte replacement protocol:   Goal of therapy: Electrolytes within normal limits:  K 3.5 - 5.1 Corrected Ca 8.9 - 10.3 Phos 2.5 - 4.6 Mg 1.7 - 2.4   Assessment: Lab Results  Component Value Date   CREATININE 0.75 10/03/2017   BUN 16 10/03/2017   NA 135 10/03/2017   K 3.7 10/03/2017   CL 101 10/03/2017   CO2 21 (L) 10/03/2017    Plan: Labs WNL, will recheck with AM labs.    Clovia Cuff, PharmD, BCPS 10/03/2017 3:29 PM

## 2017-10-03 NOTE — Progress Notes (Signed)
Clinical Social Worker (CSW) met with patient this morning to follow up. Per patient he contacted [facility] last night at 7 pm as requested. Patient reported that he has a good chance of getting in the program. CSW contacted [facility] today to confirm this however they did not answer and 2 voicemails were left.   [employer], LCSW [phone]

## 2017-10-03 NOTE — Progress Notes (Signed)
[facility] - [place] [facility]   PATIENT NAME: Kurt Horton    MR#:  [id number]  DATE OF BIRTH:  11/12/1960  SUBJECTIVE:  Patient has degenerative disease of L4 and L5 complaining of pain.  I increase the frequency of oxycodone yesterday however still not relieved.  He was seeing a pain clinician.  As far as his detox this is uneventful.  Diarrhea has improved.  REVIEW OF SYSTEMS:    Review of Systems  Constitutional: Negative for fever, chills weight loss HENT: Negative for ear pain, nosebleeds, congestion, facial swelling, rhinorrhea, neck pain, neck stiffness and ear discharge.   Respiratory: Negative for cough, shortness of breath, wheezing  Cardiovascular: Negative for chest pain, palpitations and leg swelling.  Gastrointestinal: Negative for heartburn, abdominal pain, vomiting, proved diarrhea no constipation Genitourinary: Negative for dysuria, urgency, frequency, hematuria Musculoskeletal: Negative for back pain or joint pain Neurological: Negative for dizziness, seizures, syncope, focal weakness,  numbness and headaches.  Tremors have decreased Hematological: Does not bruise/bleed easily.  Psychiatric/Behavioral: Negative for hallucinations, confusion, dysphoric mood    Tolerating Diet: yes      DRUG ALLERGIES:   Allergies  Allergen Reactions  . Other Other (See Comments)    Patient states he can't take any "mycin" medications because myasthenia gravis.    VITALS:  Blood pressure 136/73, pulse 83, temperature 99.4 F (37.4 C), temperature source Oral, resp. rate 18, height  (1.803 m), weight 79.4 kg (175 lb), SpO2 98 %.  PHYSICAL EXAMINATION:  Constitutional: Appears well-developed and well-nourished. No distress. HENT: Normocephalic. Marland Kitchen Oropharynx is clear and moist.  Eyes: Conjunctivae and EOM are normal. PERRLA, no scleral icterus.  Neck: Normal ROM. Neck supple. No JVD. No tracheal deviation. CVS: RRR, S1/S2 +, no murmurs, no  gallops, no carotid bruit.  Pulmonary: Effort and breath sounds normal, no stridor, rhonchi, wheezes, rales.  Abdominal: Soft. BS +,  no distension, tenderness, rebound or guarding.  Musculoskeletal: Normal range of motion. No edema and no tenderness.  Neuro: Alert. CN 2-12 grossly intact. No focal deficits. Skin: Skin is warm and dry. No rash noted. Psychiatric: Normal mood and affect.      LABORATORY PANEL:   CBC Recent Labs  Lab 09/30/17 1919  WBC 5.5  HGB 12.5*  HCT 39.4*  PLT 160   ------------------------------------------------------------------------------------------------------------------  Chemistries  Recent Labs  Lab 09/30/17 1919  10/03/17 0429  NA 138   < > 135  K 3.4*   < > 3.7  CL 97*   < > 101  CO2 27   < > 21*  GLUCOSE 191*   < > 110*  BUN 9   < > 16  CREATININE 0.66   < > 0.75  CALCIUM 8.3*   < > 9.1  MG  --    < > 1.8  AST 57*  --   --   ALT 33  --   --   ALKPHOS 91  --   --   BILITOT 0.6  --   --    < > = values in this interval not displayed.   ------------------------------------------------------------------------------------------------------------------  Cardiac Enzymes No results for input(s): TROPONINI in the last 168 hours. ------------------------------------------------------------------------------------------------------------------  RADIOLOGY:  No results found.   ASSESSMENT AND PLAN:   57 y/o male with Hc of Etoh dependence presented to ED with Etoh intoxication and weakness.  1. Etoh abuse: Patient counseled and seen CSW and psych Continue CIWA  2.  Hypertension: Continue metoprolol   3.  HypoMg: Replete PRN 4. Depression: celexa 5. MG: continue cellcept 6. Tobacco dependence: Patient is encouraged to quit smoking. Counseling was provided. Continue patch  7. PAF: continue metoprolol currently NSR  8. Diarrhea from detox: This is improved  9.  DJD with L4/L5 lumbar stenosis: Patient has been evaluated by  neurosurgery in the past. Patient will need outpatient follow-up.  Physical therapy consultation for discharge planning     Management plans discussed with the patient and he is in agreement.  CODE STATUS: full  TOTAL TIME TAKING CARE OF THIS PATIENT: 22 minutes.     POSSIBLE D/C tomorrow DEPENDING ON CLINICAL CONDITION.   [name] M.D on 2020-04-1818 at 12:41 PM  Between 7am to 6pm - Pager - [phone] After 6pm go to [email or web address] - password [email or web address]  [facility]  Office  [phone]  CC: Primary care physician; [name], MD  Note: This dictation was prepared with Dragon dictation along with smaller phrase technology. Any transcriptional errors that result from this process are unintentional.

## 2017-10-03 NOTE — Evaluation (Signed)
Physical Therapy Evaluation Patient Details Name: Kurt Horton MRN: [id number] DOB: Sep 19, 1960 Today's Date: 19-Aug-202019   History of Present Illness  Pt is a 57 y/o M with a known history of alcohol abuse and multiple admissions to different facilities for alcohol withdrawal, paroxysmal atrial fibrillation, prediabetes, hypertension, myasthenia gravis and lower back pain presents to hospital secondary to alcohol withdrawl. Pt on CIWA protocol in hospital  Clinical Impression  Pt is a pleasant 57 year old male who was admitted for alcohol withdrawl. Pt performs transfers and minimal ambulation with min assist using RW. Per pt, RN staff has been assisting him with +2 for transfers without AD. Pt is able to stand with increased stability with RW, however ambulation severely limited due to tremors. Not safe to progress ambulation further at this time. Will plan for further ambulation with chair follow.  Pt demonstrates deficits with strength/tremors. Pt is very fearful of falling and is at high falls risk. Unclear how much PT will be able to address balance secondary to pt undergoing detox. Did educate pt on HEP for prevention of further deconditioning. Would benefit from skilled PT to address above deficits and promote optimal return to [facility] during hospital stay.  Recommend transition to HHPT upon discharge from detox program hospitalization.       Follow Up Recommendations Home health PT;Supervision for mobility/OOB    Equipment Recommendations  None recommended by PT(has RW)    Recommendations for Other Services       Precautions / Restrictions Precautions Precautions: Fall Restrictions Weight Bearing Restrictions: No      Mobility  Bed Mobility               General bed mobility comments: not assessed as pt received in recliner  Transfers Overall transfer level: Needs assistance Equipment used: Rolling walker (2 wheeled) Transfers: Sit to/from Stand Sit to Stand: Min  assist         General transfer comment: needs assist for standing balance as pt with intentional tremors during standing.   Ambulation/Gait Ambulation/Gait assistance: Min assist Ambulation Distance (Feet): 1 Feet Assistive device: Rolling walker (2 wheeled) Gait Pattern/deviations: Step-to pattern     General Gait Details: not safe for further ambulation due to B knee weakness. Pt able to march in place a few times and take 1 step in forward/backward direction. Heavy B UE on RW. Increased tremors during movement. Further ambulation deferred  Stairs            Wheelchair Mobility    Modified Rankin (Stroke Patients Only)       Balance Overall balance assessment: History of Falls;Needs assistance Sitting-balance support: Feet supported Sitting balance-Leahy Scale: Good     Standing balance support: Bilateral upper extremity supported Standing balance-Leahy Scale: Poor                               Pertinent Vitals/Pain Pain Assessment: Faces Faces Pain Scale: Hurts little more Pain Location: low back with movement Pain Descriptors / Indicators: Grimacing;Dull;Discomfort Pain Intervention(s): Limited activity within patient's tolerance;Premedicated before session    Home Living Family/patient expects to be discharged to:: Private residence Living Arrangements: Parent Available Help at Discharge: Family Type of Home: House Home Access: Level entry     Home Layout: One level Home Equipment: Environmental consultant - 2 wheels;Cane - single point      Prior Function Level of Independence: Needs assistance   Gait / Transfers Assistance Needed:  Pt ambulating with SPC mainly household distances with several falls over the past 6 months.       Comments: Pt uses SPC for all mobility and takes [employer] for transportation. Reports severe neuropathy.     Hand Dominance        Extremity/Trunk Assessment   Upper Extremity Assessment Upper Extremity Assessment:  Generalized weakness(B UE grossly 4/5)    Lower Extremity Assessment Lower Extremity Assessment: Generalized weakness(B LE grossly 4/5)       Communication   Communication: No difficulties  Cognition Arousal/Alertness: Awake/alert Behavior During Therapy: WFL for tasks assessed/performed Overall Cognitive Status: Within Functional Limits for tasks assessed                                        General Comments      Exercises Other Exercises Other Exercises: educated pt on HEP to improve strength on B UE/LE including SLR, heel slides, LAQ, quad sets and chair push up. Educated on correct frequency and duration.   Assessment/Plan    PT Assessment Patient needs continued PT services  PT Problem List Decreased strength;Decreased balance;Decreased mobility;Decreased knowledge of use of DME;Pain       PT Treatment Interventions Gait training;Therapeutic exercise;Balance training;DME instruction    PT Goals (Current goals can be found in the Care Plan section)  Acute Rehab PT Goals Patient Stated Goal: to get stronger and detox PT Goal Formulation: With patient Time For Goal Achievement: 10/17/17 Potential to Achieve Goals: Good    Frequency Min 2X/week   Barriers to discharge        Co-evaluation               AM-PAC PT "6 Clicks" Daily Activity  Outcome Measure Difficulty turning over in bed (including adjusting bedclothes, sheets and blankets)?: A Lot Difficulty moving from lying on back to sitting on the side of the bed? : A Lot Difficulty sitting down on and standing up from a chair with arms (e.g., wheelchair, bedside commode, etc,.)?: Unable Help needed moving to and from a bed to chair (including a wheelchair)?: A Lot Help needed walking in hospital room?: A Lot Help needed climbing 3-5 steps with a railing? : A Lot 6 Click Score: 11    End of Session Equipment Utilized During Treatment: Gait belt Activity Tolerance: Patient limited  by pain Patient left: in chair;with chair alarm set Nurse Communication: Mobility status PT Visit Diagnosis: Repeated falls (R29.6);Muscle weakness (generalized) (M62.81);Difficulty in walking, not elsewhere classified (R26.2);Unsteadiness on feet (R26.81);Pain Pain - Right/Left: (back) Pain - part of body: (back)    Time: 9604-5409 PT Time Calculation (min) (ACUTE ONLY): 20 min   Charges:   PT Evaluation $PT Eval Moderate Complexity: 1 Mod     PT G CodesElizabeth Palau, PT, DPT [phone]   Noelle Sease 10/03/2017, 5:30 PM

## 2017-10-03 NOTE — Consult Note (Signed)
[facility] Consult   Reason for Consult: Follow-up for this 57 year old man with alcohol abuse in the hospital with alcohol withdrawal. Referring Physician: Mody Patient Identification: LINUS WECKERLY MRN:  [id number] Principal Diagnosis: Alcohol withdrawal [facility]) Diagnosis:   Patient Active Problem List   Diagnosis Date Noted  . Alcohol abuse [F10.10]     Priority: High  . Alcohol withdrawal ([place]) [F10.239] 11/13/2016    Priority: High  . Chronic combined systolic and diastolic CHF (congestive heart failure) ([place]) [I50.42] 08/13/2017  . Alcohol withdrawal delirium ([place]) [F10.231] 02/26/2017  . Atrial fibrillation with RVR ([place]) [I48.91] 02/25/2017  . Acute alcoholic intoxication with complication ([place]) [X10.626]   . Substance induced mood disorder ([place]) [F19.94] 11/13/2016  . SVT (supraventricular tachycardia) ([place]) [I47.1] 11/12/2016  . HTN (hypertension) [I10] 11/12/2016  . EtOH dependence ([place]) [F10.20] 11/12/2016  . Lumbar radiculopathy [M54.16] 08/24/2016  . Depression with anxiety [F41.8] 08/24/2016  . Chronic low back pain [M54.5, G89.29] 01/04/2016  . Pressure ulcer [L89.90] 12/29/2015  . Osteomyelitis ([place]) [M86.9] 12/26/2015  . PVD (peripheral vascular disease) ([place]) [I73.9] 12/26/2015  . Type II diabetes mellitus with manifestations ([place]) [E11.8] 12/26/2015  . Myasthenia gravis ([place]) [G70.00] 12/26/2015    Total Time spent with patient: 30 minutes  Subjective:   CLOY COZZENS is a 57 y.o. male patient admitted with "I am still pretty shaky".  HPI: Patient seen chart reviewed.  57 year old man with history of alcohol abuse who is in the hospital for another episode of detox.  Patient was awake alert and interactive.  He was able to attain his attention throughout the interview.  There is no sign of delirium.  He recognized me and was oriented to his situation.  Patient tells me he is still feeling very shaky and tremulous.  He has not been able to stand  up on his own independently.  He says his mood is okay.  Denies suicidal thoughts.  He says that he is of the impression that someone is working on trying to get him into rehab.  Past Psychiatric History: Long history of alcohol abuse multiple detoxes.  Patient has had rapid progression of his physical impairment from his alcohol abuse  Risk to Self: Is patient at risk for suicide?: No Risk to Others:   Prior Inpatient Therapy:   Prior Outpatient Therapy:    Past Medical History:  Past Medical History:  Diagnosis Date  . A-fib ([place])   . Alcohol abuse   . Alcohol withdrawal ([place]) 11/12/2016  . Diabetes mellitus without complication ([place])   . DJD (degenerative joint disease)   . Hypertension   . Myasthenia gravis ([place])   . Renal disorder     Past Surgical History:  Procedure Laterality Date  . AMPUTATION TOE Left 12/29/2015   Procedure: AMPUTATION TOE;  Surgeon: Samara Deist, DPM;  Location: [facility] ORS;  Service: Podiatry;  Laterality: Left;  . PERIPHERAL VASCULAR CATHETERIZATION N/A 12/27/2015   Procedure: Lower Extremity Angiography;  Surgeon: Algernon Huxley, MD;  Location: [place] [facility] CV LAB;  Service: Cardiovascular;  Laterality: N/A;  . right hip surgery    . right shoulder surgery     Family History:  Family History  Problem Relation Age of Onset  . Rheum arthritis Father    Family Psychiatric  History: None Social History:  Social History   Substance and Sexual Activity  Alcohol Use Yes   Comment: Pt had tequila and rum, at least a 5th or a pint a day  Social History   Substance and Sexual Activity  Drug Use No    Social History   Socioeconomic History  . Marital status: Divorced    Spouse name: Not on file  . Number of children: Not on file  . Years of education: Not on file  . Highest education level: Not on file  Occupational History  . Not on file  Social Needs  . Financial resource strain: Not on file  . Food insecurity:    Worry: Not on file     Inability: Not on file  . Transportation needs:    Medical: Not on file    Non-medical: Not on file  Tobacco Use  . Smoking status: Current Every Day Smoker    Packs/day: 0.50    Types: Cigarettes  . Smokeless tobacco: Never Used  Substance and Sexual Activity  . Alcohol use: Yes    Comment: Pt had tequila and rum, at least a 5th or a pint a day  . Drug use: No  . Sexual activity: Not on file  Lifestyle  . Physical activity:    Days per week: Not on file    Minutes per session: Not on file  . Stress: Not on file  Relationships  . Social connections:    Talks on phone: Not on file    Gets together: Not on file    Attends religious service: Not on file    Active member of club or organization: Not on file    Attends meetings of clubs or organizations: Not on file    Relationship status: Not on file  Other Topics Concern  . Not on file  Social History Narrative   Live with Parents, ambulates with a cane. Falls lately.   Additional Social History:    Allergies:   Allergies  Allergen Reactions  . Other Other (See Comments)    Patient states he can't take any "mycin" medications because myasthenia gravis.    Labs:  Results for orders placed or performed during the hospital encounter of 09/30/17 (from the past 48 hour(s))  Glucose, capillary     Status: Abnormal   Collection Time: 10/01/17  9:26 PM  Result Value Ref Range   Glucose-Capillary 118 (H) 65 - 99 mg/dL   Comment 1 Notify RN   Basic metabolic panel     Status: None   Collection Time: 10/02/17  4:29 AM  Result Value Ref Range   Sodium 136 135 - 145 mmol/L   Potassium 3.7 3.5 - 5.1 mmol/L   Chloride 103 101 - 111 mmol/L   CO2 23 22 - 32 mmol/L   Glucose, Bld 97 65 - 99 mg/dL   BUN 8 6 - 20 mg/dL   Creatinine, Ser 0.70 0.61 - 1.24 mg/dL   Calcium 9.1 8.9 - 10.3 mg/dL   GFR calc non Af Amer >60 >60 mL/min   GFR calc Af Amer >60 >60 mL/min    Comment: (NOTE) The eGFR has been calculated using the CKD EPI  equation. This calculation has not been validated in all clinical situations. eGFR's persistently <60 mL/min signify possible Chronic Kidney Disease.    Anion gap 10 5 - 15    Comment: Performed at [facility], [address] [place], [place] [address]  Magnesium     Status: Abnormal   Collection Time: 10/02/17  4:29 AM  Result Value Ref Range   Magnesium 1.6 (L) 1.7 - 2.4 mg/dL    Comment: Performed at [facility]  Lab, [address] [place], [place] [address]  Glucose, capillary     Status: None   Collection Time: 10/02/17  8:05 AM  Result Value Ref Range   Glucose-Capillary 99 65 - 99 mg/dL  Glucose, capillary     Status: Abnormal   Collection Time: 10/02/17 12:26 PM  Result Value Ref Range   Glucose-Capillary 131 (H) 65 - 99 mg/dL  Glucose, capillary     Status: Abnormal   Collection Time: 10/02/17  5:22 PM  Result Value Ref Range   Glucose-Capillary 146 (H) 65 - 99 mg/dL  Glucose, capillary     Status: Abnormal   Collection Time: 10/02/17  9:03 PM  Result Value Ref Range   Glucose-Capillary 131 (H) 65 - 99 mg/dL  Basic metabolic panel     Status: Abnormal   Collection Time: 10/03/17  4:29 AM  Result Value Ref Range   Sodium 135 135 - 145 mmol/L   Potassium 3.7 3.5 - 5.1 mmol/L   Chloride 101 101 - 111 mmol/L   CO2 21 (L) 22 - 32 mmol/L   Glucose, Bld 110 (H) 65 - 99 mg/dL   BUN 16 6 - 20 mg/dL   Creatinine, Ser 0.75 0.61 - 1.24 mg/dL   Calcium 9.1 8.9 - 10.3 mg/dL   GFR calc non Af Amer >60 >60 mL/min   GFR calc Af Amer >60 >60 mL/min    Comment: (NOTE) The eGFR has been calculated using the CKD EPI equation. This calculation has not been validated in all clinical situations. eGFR's persistently <60 mL/min signify possible Chronic Kidney Disease.    Anion gap 13 5 - 15    Comment: Performed at [facility], [address] [place], [place] [address]  Magnesium     Status: None   Collection Time: 10/03/17  4:29 AM  Result Value Ref  Range   Magnesium 1.8 1.7 - 2.4 mg/dL    Comment: Performed at [facility], [address] [place], [place] [address]  Phosphorus     Status: None   Collection Time: 10/03/17  4:29 AM  Result Value Ref Range   Phosphorus 3.4 2.5 - 4.6 mg/dL    Comment: Performed at [facility], [address] [place], [place] [address]  Glucose, capillary     Status: Abnormal   Collection Time: 10/03/17  8:07 AM  Result Value Ref Range   Glucose-Capillary 112 (H) 65 - 99 mg/dL  Glucose, capillary     Status: Abnormal   Collection Time: 10/03/17 12:02 PM  Result Value Ref Range   Glucose-Capillary 127 (H) 65 - 99 mg/dL  Glucose, capillary     Status: Abnormal   Collection Time: 10/03/17  4:47 PM  Result Value Ref Range   Glucose-Capillary 124 (H) 65 - 99 mg/dL    Current Facility-Administered Medications  Medication Dose Route Frequency Provider Last Rate Last Dose  . amiodarone (PACERONE) tablet 200 mg  200 mg Oral Daily Arta Silence, MD   200 mg at 10/03/17 1202  . aspirin EC tablet 325 mg  325 mg Oral Daily Arta Silence, MD   325 mg at 10/03/17 0827  . bisacodyl (DULCOLAX) EC tablet 5 mg  5 mg Oral Daily PRN Arta Silence, MD      . citalopram (CELEXA) tablet 20 mg  20 mg Oral Daily Arta Silence, MD   20 mg at 10/03/17 0827  . enoxaparin (LOVENOX) injection 40 mg  40 mg Subcutaneous Q24H Arta Silence, MD  40 mg at 10/02/17 2150  . folic acid (FOLVITE) tablet 1 mg  1 mg Oral Daily Arta Silence, MD   1 mg at 10/03/17 0827  . gabapentin (NEURONTIN) capsule 600 mg  600 mg Oral TID Arta Silence, MD   600 mg at 10/03/17 0827  . insulin aspart (novoLOG) injection 0-15 Units  0-15 Units Subcutaneous TID WC Arta Silence, MD   2 Units at 10/03/17 1211  . lidocaine (LIDODERM) 5 % 1 patch  1 patch Transdermal Q24H Arta Silence, MD   1 patch at 10/03/17 [phone]  . loperamide (IMODIUM) capsule 2 mg  2 mg Oral Q6H PRN Bettey Costa,  MD   2 mg at 10/03/17 1203  . LORazepam (ATIVAN) injection 0-4 mg  0-4 mg Intravenous Q12H Arta Silence, MD   2 mg at 10/03/17 0636  . LORazepam (ATIVAN) tablet 1 mg  1 mg Oral Q4H PRN Bettey Costa, MD   1 mg at 10/02/17 1708   Or  . LORazepam (ATIVAN) injection 1 mg  1 mg Intravenous Q6H PRN Bettey Costa, MD   1 mg at 10/02/17 0836  . metoprolol succinate (TOPROL-XL) 24 hr tablet 75 mg  75 mg Oral Daily Bettey Costa, MD   75 mg at 10/03/17 1202  . multivitamin with minerals tablet 1 tablet  1 tablet Oral Daily Arta Silence, MD   1 tablet at 10/03/17 0827  . mycophenolate (CELLCEPT) capsule 1,000 mg  1,000 mg Oral QPM Arta Silence, MD   1,000 mg at 10/01/17 2151  . mycophenolate (CELLCEPT) capsule 500 mg  500 mg Oral Daily Arta Silence, MD   500 mg at 10/03/17 0828  . nicotine (NICODERM CQ - dosed in mg/24 hours) patch 21 mg  21 mg Transdermal Daily Bettey Costa, MD   21 mg at 10/03/17 0826  . ondansetron (ZOFRAN) tablet 4 mg  4 mg Oral Q6H PRN Arta Silence, MD       Or  . ondansetron (ZOFRAN) injection 4 mg  4 mg Intravenous Q6H PRN Arta Silence, MD   4 mg at 10/01/17 1019  . oxyCODONE (Oxy IR/ROXICODONE) immediate release tablet 10 mg  10 mg Oral Q4H PRN Bettey Costa, MD   10 mg at 10/03/17 1438  . protein supplement (PREMIER PROTEIN) liquid  11 oz Oral BID BM Mody, Sital, MD   11 oz at 10/03/17 1440  . senna-docusate (Senokot-S) tablet 1 tablet  1 tablet Oral QHS PRN Arta Silence, MD      . thiamine (VITAMIN B-1) tablet 100 mg  100 mg Oral Daily Arta Silence, MD   100 mg at 10/03/17 0827   Or  . thiamine (B-1) injection 100 mg  100 mg Intravenous Daily Arta Silence, MD        Musculoskeletal: Strength & Muscle Tone: decreased and atrophy Gait & Station: unsteady Patient leans: N/A  Psychiatric Specialty Exam: Physical Exam  Nursing note reviewed. Constitutional: He appears well-developed and well-nourished.  HENT:  Head:  Normocephalic and atraumatic.  Eyes: Pupils are equal, round, and reactive to light. Conjunctivae are normal.  Neck: Normal range of motion.  Cardiovascular: Normal heart sounds.  Respiratory: Effort normal.  GI: Soft.  Musculoskeletal: Normal range of motion.  Neurological: He is alert. He displays atrophy and tremor.  Skin: Skin is warm and dry.  Psychiatric: Judgment normal. His affect is blunt. His speech is delayed. He is slowed. Thought content is not paranoid. Cognition and memory are impaired. He expresses no homicidal and no suicidal  ideation.    Review of Systems  Constitutional: Negative.   HENT: Negative.   Eyes: Negative.   Respiratory: Negative.   Cardiovascular: Negative.   Gastrointestinal: Negative.   Musculoskeletal: Negative.   Skin: Negative.   Neurological: Positive for tremors.  Psychiatric/Behavioral: Positive for memory loss. Negative for depression, hallucinations, substance abuse and suicidal ideas. The patient is nervous/anxious and has insomnia.     Blood pressure (!) 149/95, pulse 74, temperature 98.9 F (37.2 C), temperature source Oral, resp. rate 18, height '5\' 11"'$  (1.803 m), weight 79.4 kg (175 lb), SpO2 99 %.Body mass index is 24.41 kg/m.  General Appearance: Disheveled  Eye Contact:  Fair  Speech:  Slow  Volume:  Decreased  Mood:  Euthymic  Affect:  Constricted  Thought Process:  Goal Directed  Orientation:  Full (Time, Place, and Person)  Thought Content:  Logical  Suicidal Thoughts:  No  Homicidal Thoughts:  No  Memory:  Immediate;   Fair Recent;   Fair Remote;   Fair  Judgement:  Impaired  Insight:  Shallow  Psychomotor Activity:  Decreased  Concentration:  Concentration: Poor  Recall:  [employer] of Knowledge:  Fair  Language:  Fair  Akathisia:  No  Handed:  Right  AIMS (if indicated):     Assets:  Desire for Improvement  ADL's:  Impaired  Cognition:  Impaired,  Mild  Sleep:        Treatment Plan Summary: Plan 57 year old  man with alcohol dependence.  He is detoxing without incident and not showing any current signs of delirium tremens.  No need to change medication for that.  Mood is mildly dysphoric but he does not have a major depression does not require specific medicine.  As I have seen in the past although the patient can factually describe his circumstances his real understanding of it is not very good.  He is talking about going to some kind of rehab but at this point he is not even able to stand up.  Patient seems to have very serious social problems in that apparently his parents do not want him back home.  Not clear where he will go to stay once he is stabilized.  Supportive counseling and encouragement.  No change to medicine I will follow-up while in the hospital.  Disposition: No evidence of imminent risk to self or others at present.   Patient does not meet criteria for psychiatric inpatient admission.  Alethia Berthold, MD 10/03/2017 5:07 PM

## 2017-10-04 DIAGNOSIS — E440 Moderate protein-calorie malnutrition: Secondary | ICD-10-CM

## 2017-10-04 LAB — BASIC METABOLIC PANEL
ANION GAP: 10 (ref 5–15)
BUN: 22 mg/dL — ABNORMAL HIGH (ref 6–20)
CO2: 23 mmol/L (ref 22–32)
Calcium: 9.1 mg/dL (ref 8.9–10.3)
Chloride: 103 mmol/L (ref 101–111)
Creatinine, Ser: 0.75 mg/dL (ref 0.61–1.24)
GLUCOSE: 104 mg/dL — AB (ref 65–99)
POTASSIUM: 3.5 mmol/L (ref 3.5–5.1)
Sodium: 136 mmol/L (ref 135–145)

## 2017-10-04 LAB — GLUCOSE, CAPILLARY
GLUCOSE-CAPILLARY: 133 mg/dL — AB (ref 65–99)
GLUCOSE-CAPILLARY: 125 mg/dL — AB (ref 65–99)

## 2017-10-04 LAB — PHOSPHORUS: PHOSPHORUS: 4.9 mg/dL — AB (ref 2.5–4.6)

## 2017-10-04 LAB — MAGNESIUM: Magnesium: 1.8 mg/dL (ref 1.7–2.4)

## 2017-10-04 MED ORDER — GABAPENTIN 300 MG PO CAPS
600.0000 mg | ORAL_CAPSULE | Freq: Three times a day (TID) | ORAL | 0 refills | Status: DC
Start: 2017-10-04 — End: 2017-11-02

## 2017-10-04 MED ORDER — LIDOCAINE 5 % EX PTCH: 1.0000 | MEDICATED_PATCH | CUTANEOUS | 0 refills | Status: DC

## 2017-10-04 MED ORDER — METOPROLOL SUCCINATE ER 50 MG PO TB24: 50.0000 mg | ORAL_TABLET | Freq: Every day | ORAL | 0 refills | Status: DC

## 2017-10-04 MED ORDER — FUROSEMIDE 40 MG PO TABS: 40.0000 mg | ORAL_TABLET | Freq: Every day | ORAL | 0 refills | Status: DC | PRN

## 2017-10-04 MED ORDER — CITALOPRAM HYDROBROMIDE 20 MG PO TABS: 20.0000 mg | ORAL_TABLET | Freq: Every day | ORAL | 0 refills | Status: DC

## 2017-10-04 MED ORDER — POTASSIUM CHLORIDE ER 10 MEQ PO TBCR: 10.0000 meq | EXTENDED_RELEASE_TABLET | Freq: Every day | ORAL | 0 refills | Status: DC

## 2017-10-04 MED ORDER — METFORMIN HCL 500 MG PO TABS: 500.0000 mg | ORAL_TABLET | Freq: Every day | ORAL | 0 refills | Status: DC

## 2017-10-04 MED ORDER — MYCOPHENOLATE MOFETIL 500 MG PO TABS: 500.0000 mg | ORAL_TABLET | Freq: Two times a day (BID) | ORAL | 0 refills | Status: DC

## 2017-10-04 MED ORDER — PREMIER PROTEIN SHAKE: 11.0000 [oz_av] | Freq: Two times a day (BID) | ORAL | 1 refills | Status: DC

## 2017-10-04 MED ORDER — NICOTINE 21 MG/24HR TD PT24: 21.0000 mg | MEDICATED_PATCH | Freq: Every day | TRANSDERMAL | 0 refills | Status: DC

## 2017-10-04 MED ORDER — AMIODARONE HCL 200 MG PO TABS: 200.0000 mg | ORAL_TABLET | Freq: Every day | ORAL | 0 refills | Status: DC

## 2017-10-04 NOTE — Progress Notes (Addendum)
Physical Therapy Treatment Patient Details Name: Kurt Horton MRN: [id number] DOB: 08-01-1960 Today's Date: 10/04/2017    History of Present Illness Pt is a 57 y/o M with a known history of alcohol abuse and multiple admissions to different facilities for alcohol withdrawal, paroxysmal atrial fibrillation, prediabetes, hypertension, myasthenia gravis and lower back pain presents to hospital secondary to alcohol withdrawl. Pt on CIWA protocol in hospital    PT Comments    Pt on phone call for 10 minutes prior to start of session and kept stating "Just a minute".  Pt agreed to session.  Bed mobility without assist.  Stood with min guard x 2 for safety.  He was able to ambulate around nursing unit this am with walker and min guard/assist +1 with wheelchair follow for safety.  Pt did rely heavily on walker for UE support but did not have any buckling or LOB.  He did have a right lean at times and required min guard/assist at times for gait.  C/O UE fatigue.  Upon return to room he answered another phone call but was able to transition back into bed without assist.  Pt stating during session that "I need a lot of help from 2 people to walk" and also stated it to his father on the phone upon return to room.  Reviewed with pt that he was able to walk with +1 min guard/assist without difficulty and 2nd assist was only used for safety as his tremors yesterday limited mobility.  Pt did have some light tremors but it did not significantly impact his gait this am.    Given pt's ambulation distance and quality, will continue with HHPT recommendations and that pt have +1 assist for safety with gait and mobility upon discharge.   Follow Up Recommendations  Home health PT;Supervision for mobility/OOB     Equipment Recommendations  Rolling walker with 5" wheels    Recommendations for Other Services       Precautions / Restrictions Precautions Precautions: Fall Restrictions Weight Bearing Restrictions:  No    Mobility  Bed Mobility Overal bed mobility: Modified Independent                Transfers Overall transfer level: Modified independent Equipment used: Rolling walker (2 wheeled) Transfers: Sit to/from Stand Sit to Stand: Min guard            Ambulation/Gait Ambulation/Gait assistance: Min assist Ambulation Distance (Feet): 180 Feet Assistive device: Rolling walker (2 wheeled) Gait Pattern/deviations: Step-through pattern;Decreased step length - right   Gait velocity interpretation: <1.8 ft/sec, indicate of risk for recurrent falls General Gait Details: right lean with gait but no LOB or buckling.  Requires +1 for safety   Stairs             Wheelchair Mobility    Modified Rankin (Stroke Patients Only)       Balance Overall balance assessment: History of Falls;Needs assistance Sitting-balance support: Feet supported       Standing balance support: Bilateral upper extremity supported Standing balance-Leahy Scale: Fair Standing balance comment: right lean at times but min gaurd/assist to maintain upright with static and dynamic activities.                            Cognition Arousal/Alertness: Awake/alert Behavior During Therapy: WFL for tasks assessed/performed Overall Cognitive Status: Within Functional Limits for tasks assessed  Exercises      General Comments        Pertinent Vitals/Pain Pain Assessment: No/denies pain    Home Living                      Prior Function            PT Goals (current goals can now be found in the care plan section) Progress towards PT goals: Progressing toward goals    Frequency    Min 2X/week      PT Plan Current plan remains appropriate    Co-evaluation              AM-PAC PT "6 Clicks" Daily Activity  Outcome Measure  Difficulty turning over in bed (including adjusting bedclothes, sheets and  blankets)?: None Difficulty moving from lying on back to sitting on the side of the bed? : None Difficulty sitting down on and standing up from a chair with arms (e.g., wheelchair, bedside commode, etc,.)?: A Little Help needed moving to and from a bed to chair (including a wheelchair)?: A Little Help needed walking in hospital room?: A Little Help needed climbing 3-5 steps with a railing? : A Little 6 Click Score: 20    End of Session Equipment Utilized During Treatment: Gait belt Activity Tolerance: Patient tolerated treatment well;Patient limited by fatigue Patient left: in bed;with bed alarm set;with call bell/phone within reach         Time: 1610-9604 PT Time Calculation (min) (ACUTE ONLY): 17 min  Charges:  $Gait Training: 8-22 mins                    G Codes:       Danielle Dess, PTA 10/04/17, 9:50 AM

## 2017-10-04 NOTE — Care Management Note (Signed)
Case Management Note  Patient Details  Name: Kurt Horton MRN: [id number] Date of Birth: 1960/12/31  Subjective/Objective:   Met with patient to discuss discharge planning. He plans to stay at a hotel. Will need PT and SW. Offered list of home care agencies. No preference. Referral to [facility] and they were no able to see patient until next week. [facility]erral to [facility] for PT and SW, requested a walker. PCP is Dr. Ginette Pitman.   Hotel Address: [employer]: [address],  [place], [place]                 Action/Plan: Discharging today  Expected Discharge Date:  10/04/17               Expected Discharge Plan:  [place]  In-House Referral:     Discharge planning Services  CM Consult  Post Acute Care Choice:    Choice offered to:  Patient  DME Arranged:  Walker rolling DME Agency:  [place] Arranged:  PT, Social Work [employer] Agency:  [place]  Status of Service:  Completed, signed off  If discussed at [employer], dates discussed:    Additional Comments:  Jolly Mango, RN April 27, 202019, 10:05 AM

## 2017-10-04 NOTE — Progress Notes (Signed)
Discharge summary reviewed with verbal understanding. Rxs given upon discharge. Patient's pastor arrived to transport. Escorted to via wc to vehicle.

## 2017-10-04 NOTE — Progress Notes (Signed)
Discussed d/c plan with CSW, patient's father and PT.  PT will reeval patient. Patient will find a hotel room and CM arrange HHC at hotel.

## 2017-10-04 NOTE — Progress Notes (Signed)
Per Legrand Como at [facility] he is unsure if patient will be a good fit because patient told him he can't work. Per Legrand Como he doesn't have bed for the next couple of days and will follow up with patient via telephone about the possibility of getting into the program. Patient does have a working cell phone that Legrand Como can call him on. Clinical Social Worker (CSW) met with patient and made him aware of above. Per patient he is going to a hotel today Production assistant, radio) and if his pastor can't pick him up then he will arrange an uber. Patient has the [place] app on his phone. RN case manager aware of above. Please reconsult if future social work needs arise. CSW signing off.   [employer], LCSW [phone]

## 2017-10-04 NOTE — Progress Notes (Signed)
Pharmacy consulted for electrolyte replacement protocol:   Goal of therapy: Electrolytes within normal limits:  K 3.5 - 5.1 Corrected Ca 8.9 - 10.3 Phos 2.5 - 4.6 Mg 1.7 - 2.4   Assessment: Lab Results  Component Value Date   CREATININE 0.75 10/04/2017   BUN 22 (H) 10/04/2017   NA 136 10/04/2017   K 3.5 10/04/2017   CL 103 10/04/2017   CO2 23 10/04/2017    Plan: Labs WNL, will follow up with AM labs tomorrow.    Luan Pulling, PharmD, MBA, [employer] Clinical Pharmacist [facility]

## 2017-10-04 NOTE — Discharge Summary (Signed)
[facility] [place] [facility]   PATIENT NAME: Kurt Horton    MR#:  [id number]  DATE OF BIRTH:  1960/09/22  DATE OF ADMISSION:  09/30/2017 ADMITTING PHYSICIAN: Barbaraann Rondo, MD  DATE OF DISCHARGE: 23-Jun-202019  PRIMARY CARE PHYSICIAN: [name], MD    ADMISSION DIAGNOSIS:  Delirium tremens (HCC) [F10.231] Alcohol intoxication with delirium (HCC) [F10.921]  DISCHARGE DIAGNOSIS:  Principal Problem:   Alcohol withdrawal (HCC) Active Problems:   Alcohol abuse   Malnutrition of moderate degree   SECONDARY DIAGNOSIS:   Past Medical History:  Diagnosis Date  . A-fib (HCC)   . Alcohol abuse   . Alcohol withdrawal (HCC) 11/12/2016  . Diabetes mellitus without complication (HCC)   . DJD (degenerative joint disease)   . Hypertension   . Myasthenia gravis (HCC)   . Renal disorder     HOSPITAL COURSE:  57 y/o male with Hc of Etoh dependence presented to ED with Etoh intoxication and weakness.  1. Etoh abuse: Patient counseled and seen by CSW and psychiatry. He had uneventful detox.  His plan was to seek inpatient alcohol rehab.  2.  Hypertension: He will continue metoprolol   3. HypoMg:  This was repleted   4. Depression: Continue Celexa 5. MG: continue cellcept 6. Tobacco dependence: Patient is encouraged to quit smoking. Counseling was provided. He will be discharged on nicotine patch  7. PAF: Continue metoprolol and amiodarone currently NSR  8. Diarrhea from detox: This has improved  9.  DJD with L4/L5 lumbar stenosis: Patient has been evaluated by neurosurgery in the past. She would benefit from outpatient follow-up     DISCHARGE CONDITIONS AND DIET:   Stable for discharge on regular diet  CONSULTS OBTAINED:  Treatment Team:  Barbaraann Rondo, MD [place], Jackquline Denmark, MD  DRUG ALLERGIES:   Allergies  Allergen Reactions  . Other Other (See Comments)    Patient states he can't take any "mycin" medications because  myasthenia gravis.    DISCHARGE MEDICATIONS:   Allergies as of 23-Jun-202019      Reactions   Other Other (See Comments)   Patient states he can't take any "mycin" medications because myasthenia gravis.      Medication List    STOP taking these medications   chlordiazePOXIDE 5 MG capsule Commonly known as:  LIBRIUM     TAKE these medications   amiodarone 200 MG tablet Commonly known as:  PACERONE Take 1 tablet (200 mg total) by mouth daily.   aspirin 325 MG EC tablet Take 1 tablet (325 mg total) by mouth daily.   B-complex with vitamin C tablet Take 1 tablet by mouth daily.   citalopram 20 MG tablet Commonly known as:  CELEXA Take 1 tablet (20 mg total) by mouth daily.   furosemide 40 MG tablet Commonly known as:  LASIX Take 1 tablet (40 mg total) by mouth daily as needed for fluid (if taking prednisone).   gabapentin 300 MG capsule Commonly known as:  NEURONTIN Take 2 capsules (600 mg total) by mouth 3 (three) times daily.   lidocaine 5 % Commonly known as:  LIDODERM Place 1 patch onto the skin daily. Remove & Discard patch within 12 hours or as directed by MD Start taking on:  10/05/2017   metFORMIN 500 MG tablet Commonly known as:  GLUCOPHAGE Take 1 tablet (500 mg total) by mouth daily.   metoprolol succinate 50 MG 24 hr tablet Commonly known as:  TOPROL-XL Take 1 tablet (50 mg total) by  mouth daily. Take with or immediately following a meal.   multivitamin Tabs tablet Take 1 tablet by mouth daily.   mycophenolate 500 MG tablet Commonly known as:  CELLCEPT Take 1 tablet (500 mg total) by mouth 2 (two) times daily. Take 1 tablet ( ) by mouth every morning and 2 tablets ( ) by mouth every evening What changed:    how much to take  how to take this  when to take this   nicotine 21 mg/24hr patch Commonly known as:  NICODERM CQ - dosed in mg/24 hours Place 1 patch (21 mg total) onto the skin daily.   potassium chloride 10 MEQ tablet Commonly  known as:  K-DUR Take 1 tablet (10 mEq total) by mouth daily.   protein supplement shake Liqd Commonly known as:  PREMIER PROTEIN Take 325 mLs (11 oz total) by mouth 2 (two) times daily between meals.         Today   CHIEF COMPLAINT:   Patient work with physical therapy.  He does not feel he can go to [place] for [place] re[place] because he does not feel that he can work.   VITAL SIGNS:  Blood pressure (!) 137/94, pulse 72, temperature 98.2 F (36.8 C), temperature source Oral, resp. rate 16, height  (1.803 m), weight 79.4 kg (175 lb), SpO2 100 %.   REVIEW OF SYSTEMS:  Review of Systems  Constitutional: Positive for malaise/fatigue. Negative for chills and fever.  HENT: Negative.  Negative for ear discharge, ear pain, hearing loss, nosebleeds and sore throat.   Eyes: Negative.  Negative for blurred vision and pain.  Respiratory: Negative.  Negative for cough, hemoptysis, shortness of breath and wheezing.   Cardiovascular: Negative.  Negative for chest pain, palpitations and leg swelling.  Gastrointestinal: Negative.  Negative for abdominal pain, blood in stool, diarrhea, nausea and vomiting.  Genitourinary: Negative.  Negative for dysuria.  Musculoskeletal: Negative.  Negative for back pain.  Skin: Negative.   Neurological: Negative for dizziness, tremors, speech change, focal weakness, seizures and headaches.  Endo/Heme/Allergies: Negative.  Does not bruise/bleed easily.  Psychiatric/Behavioral: Negative.  Negative for depression, hallucinations and suicidal ideas.     PHYSICAL EXAMINATION:  GENERAL:  57 y.o.-year-old patient lying in the bed with no acute distress.  NECK:  Supple, no jugular venous distention. No thyroid enlargement, no tenderness.  LUNGS: Normal breath sounds bilaterally, no wheezing, rales,rhonchi  No use of accessory muscles of respiration.  CARDIOVASCULAR: S1, S2 normal. No murmurs, rubs, or gallops.  ABDOMEN: Soft, non-tender,  non-distended. Bowel sounds present. No organomegaly or mass.  EXTREMITIES: No pedal edema, cyanosis, or clubbing.  PSYCHIATRIC: The patient is alert and oriented x 3.  SKIN: No obvious rash, lesion, or ulcer.   DATA REVIEW:   CBC Recent Labs  Lab 09/30/17 1919  WBC 5.5  HGB 12.5*  HCT 39.4*  PLT 160    Chemistries  Recent Labs  Lab 09/30/17 1919  10/04/17 0410  NA 138   < > 136  K 3.4*   < > 3.5  CL 97*   < > 103  CO2 27   < > 23  GLUCOSE 191*   < > 104*  BUN 9   < > 22*  CREATININE 0.66   < > 0.75  CALCIUM 8.3*   < > 9.1  MG  --    < > 1.8  AST 57*  --   --   ALT 33  --   --  ALKPHOS 91  --   --   BILITOT 0.6  --   --    < > = values in this interval not displayed.    Cardiac Enzymes No results for input(s): TROPONINI in the last 168 hours.  Microbiology Results  @  RADIOLOGY:  No results found.    Allergies as of 08/28/202019      Reactions   Other Other (See Comments)   Patient states he can't take any "mycin" medications because myasthenia gravis.      Medication List    STOP taking these medications   chlordiazePOXIDE 5 MG capsule Commonly known as:  LIBRIUM     TAKE these medications   amiodarone 200 MG tablet Commonly known as:  PACERONE Take 1 tablet (200 mg total) by mouth daily.   aspirin 325 MG EC tablet Take 1 tablet (325 mg total) by mouth daily.   B-complex with vitamin C tablet Take 1 tablet by mouth daily.   citalopram 20 MG tablet Commonly known as:  CELEXA Take 1 tablet (20 mg total) by mouth daily.   furosemide 40 MG tablet Commonly known as:  LASIX Take 1 tablet (40 mg total) by mouth daily as needed for fluid (if taking prednisone).   gabapentin 300 MG capsule Commonly known as:  NEURONTIN Take 2 capsules (600 mg total) by mouth 3 (three) times daily.   lidocaine 5 % Commonly known as:  LIDODERM Place 1 patch onto the skin daily. Remove & Discard patch within 12 hours or as directed by MD Start  taking on:  10/05/2017   metFORMIN 500 MG tablet Commonly known as:  GLUCOPHAGE Take 1 tablet (500 mg total) by mouth daily.   metoprolol succinate 50 MG 24 hr tablet Commonly known as:  TOPROL-XL Take 1 tablet (50 mg total) by mouth daily. Take with or immediately following a meal.   multivitamin Tabs tablet Take 1 tablet by mouth daily.   mycophenolate 500 MG tablet Commonly known as:  CELLCEPT Take 1 tablet (500 mg total) by mouth 2 (two) times daily. Take 1 tablet ( ) by mouth every morning and 2 tablets ( ) by mouth every evening What changed:    how much to take  how to take this  when to take this   nicotine 21 mg/24hr patch Commonly known as:  NICODERM CQ - dosed in mg/24 hours Place 1 patch (21 mg total) onto the skin daily.   potassium chloride 10 MEQ tablet Commonly known as:  K-DUR Take 1 tablet (10 mEq total) by mouth daily.   protein supplement shake Liqd Commonly known as:  PREMIER PROTEIN Take 325 mLs (11 oz total) by mouth 2 (two) times daily between meals.          Management plans discussed with the patient and he is in agreement. Stable for discharge   Patient should follow up with pcp  CODE STATUS:     Code Status Orders  (From admission, onward)        Start     Ordered   10/01/17 0808  Full code  Continuous     10/01/17 0807    Code Status History    Date Active Date Inactive Code Status Order ID Comments User Context   08/24/2017 1756 08/27/2017 1849 Full Code [id number]  Enid Baas, MD ED   08/14/2017 0030 08/18/2017 1605 Full Code [id number]  Oralia Manis, MD Inpatient   06/04/2017 0116 06/06/2017 1701 Full Code [id number]  Oralia Manis, MD Inpatient  03/19/2017 1559 03/20/2017 2128 Full Code [id number]  Arnaldo Natal, MD ED   02/25/2017 1421 03/01/2017 1829 Full Code [id number]  Houston Siren, MD Inpatient   11/12/2016 1533 11/14/2016 1425 Full Code [id number]  Marguarite Arbour, MD Inpatient   12/26/2015 1927  12/30/2015 1604 Full Code [id number]  Marguarite Arbour, MD Inpatient      TOTAL TIME TAKING CARE OF THIS PATIENT: 38 minutes.    Note: This dictation was prepared with Dragon dictation along with smaller phrase technology. Any transcriptional errors that result from this process are unintentional.  Mia Milan M.D on 10/04/2017 at 8:27 AM  Between 7am to 6pm - Pager - [phone] After 6pm go to [email or web address] - Social research officer, government  [facility]  Office  [phone]  CC: Primary care physician; [name], MD

## 2017-10-05 ENCOUNTER — Encounter: Payer: Self-pay | Admitting: Emergency Medicine

## 2017-10-05 ENCOUNTER — Emergency Department
Admission: EM | Admit: 2017-10-05 | Discharge: 2017-10-07 | Disposition: A | Discharge status: Home / Self Care | Payer: Medicaid Other | Attending: Emergency Medicine | Admitting: Emergency Medicine

## 2017-10-05 DIAGNOSIS — Z7982 Long term (current) use of aspirin: Secondary | ICD-10-CM | POA: Diagnosis not present

## 2017-10-05 DIAGNOSIS — E119 Type 2 diabetes mellitus without complications: Secondary | ICD-10-CM | POA: Insufficient documentation

## 2017-10-05 DIAGNOSIS — F17210 Nicotine dependence, cigarettes, uncomplicated: Secondary | ICD-10-CM | POA: Insufficient documentation

## 2017-10-05 DIAGNOSIS — F1010 Alcohol abuse, uncomplicated: Secondary | ICD-10-CM | POA: Diagnosis not present

## 2017-10-05 DIAGNOSIS — Z79899 Other long term (current) drug therapy: Secondary | ICD-10-CM | POA: Diagnosis not present

## 2017-10-05 DIAGNOSIS — I5042 Chronic combined systolic (congestive) and diastolic (congestive) heart failure: Secondary | ICD-10-CM | POA: Insufficient documentation

## 2017-10-05 DIAGNOSIS — Z046 Encounter for general psychiatric examination, requested by authority: Secondary | ICD-10-CM | POA: Diagnosis present

## 2017-10-05 DIAGNOSIS — R45851 Suicidal ideations: Secondary | ICD-10-CM | POA: Diagnosis not present

## 2017-10-05 DIAGNOSIS — I110 Hypertensive heart disease with heart failure: Secondary | ICD-10-CM | POA: Insufficient documentation

## 2017-10-05 DIAGNOSIS — Z7984 Long term (current) use of oral hypoglycemic drugs: Secondary | ICD-10-CM | POA: Insufficient documentation

## 2017-10-05 LAB — CBC
HEMATOCRIT: 41.8 % (ref 40.0–52.0)
Hemoglobin: 13.2 g/dL (ref 13.0–18.0)
MCH: 24.8 pg — ABNORMAL LOW (ref 26.0–34.0)
MCHC: 31.5 g/dL — ABNORMAL LOW (ref 32.0–36.0)
MCV: 78.7 fL — ABNORMAL LOW (ref 80.0–100.0)
Platelets: 218 10*3/uL (ref 150–440)
RBC: 5.31 MIL/uL (ref 4.40–5.90)
RDW: 24.9 % — AB (ref 11.5–14.5)
WBC: 4.8 10*3/uL (ref 3.8–10.6)

## 2017-10-05 LAB — ETHANOL: Alcohol, Ethyl (B): 326 mg/dL (ref <10)

## 2017-10-05 LAB — COMPREHENSIVE METABOLIC PANEL
ALBUMIN: 4.3 g/dL (ref 3.5–5.0)
ALT: 35 U/L (ref 17–63)
ANION GAP: 15 (ref 5–15)
AST: 34 U/L (ref 15–41)
Alkaline Phosphatase: 83 U/L (ref 38–126)
BILIRUBIN TOTAL: 0.6 mg/dL (ref 0.3–1.2)
BUN: 13 mg/dL (ref 6–20)
CHLORIDE: 102 mmol/L (ref 101–111)
CO2: 20 mmol/L — ABNORMAL LOW (ref 22–32)
Calcium: 8.9 mg/dL (ref 8.9–10.3)
Creatinine, Ser: 0.88 mg/dL (ref 0.61–1.24)
GFR calc Af Amer: >60 mL/min (ref >60)
GFR calc non Af Amer: >60 mL/min (ref >60)
GLUCOSE: 149 mg/dL — AB (ref 65–99)
POTASSIUM: 3.3 mmol/L — AB (ref 3.5–5.1)
SODIUM: 137 mmol/L (ref 135–145)
TOTAL PROTEIN: 7.7 g/dL (ref 6.5–8.1)

## 2017-10-05 LAB — ACETAMINOPHEN LEVEL: Acetaminophen (Tylenol), Serum: <10 ug/mL — ABNORMAL LOW (ref 10–30)

## 2017-10-05 LAB — SALICYLATE LEVEL: Salicylate Lvl: <7 mg/dL (ref 2.8–30.0)

## 2017-10-05 NOTE — ED Notes (Signed)
Pt unable to respond to questions at this time. Unable to complete psych assessment.

## 2017-10-05 NOTE — BH Assessment (Signed)
Per the request of ER MD (Dr. Darnelle Catalan), writer attempted to talk with the patient but he was unable to participate in the interview.

## 2017-10-05 NOTE — ED Triage Notes (Signed)
10 airplane bottles of liquor within hr. Here IVC with [employer].  Manager of [employer] called wanting pt out of room.  Pt told police he wanted to die.  Now minimally responsive. Opens eyes to sternal rub only.  VSS.  Breathing WNL.

## 2017-10-05 NOTE — ED Provider Notes (Addendum)
[facility] Emergency Department Provider Note   ____________________________________________   First MD Initiated Contact with Patient 10/05/17 1854     (approximate)  I have reviewed the triage vital signs and the nursing notes.   HISTORY  Chief Complaint Psychiatric Evaluation    HPI Kurt Horton is a 57 y.o. male who comes under commitment with [employer]. My critical manager called and said he wanted out of the room. Patient apparently had had 10 implant bottles of liquor within an hour. And told the police he wanted to die. Here patient is difficult to arouse after sternal rub he wakes up and says his low back hurts a lot and he does not want to hurt himself or anyone else. He probably does back to sleep.   Past Medical History:  Diagnosis Date  . A-fib (HCC)   . Alcohol abuse   . Alcohol withdrawal (HCC) 11/12/2016  . Diabetes mellitus without complication (HCC)   . DJD (degenerative joint disease)   . Hypertension   . Myasthenia gravis (HCC)   . Renal disorder     Patient Active Problem List   Diagnosis Date Noted  . Malnutrition of moderate degree 10/04/2017  . Chronic combined systolic and diastolic CHF (congestive heart failure) (HCC) 08/13/2017  . Alcohol withdrawal delirium (HCC) 02/26/2017  . Atrial fibrillation with RVR (HCC) 02/25/2017  . Acute alcoholic intoxication with complication (HCC)   . Alcohol abuse   . Alcohol withdrawal (HCC) 11/13/2016  . Substance induced mood disorder (HCC) 11/13/2016  . SVT (supraventricular tachycardia) (HCC) 11/12/2016  . HTN (hypertension) 11/12/2016  . EtOH dependence (HCC) 11/12/2016  . Lumbar radiculopathy 08/24/2016  . Depression with anxiety 08/24/2016  . Chronic low back pain 01/04/2016  . Pressure ulcer 12/29/2015  . Osteomyelitis (HCC) 12/26/2015  . PVD (peripheral vascular disease) (HCC) 12/26/2015  . Type II diabetes mellitus with manifestations (HCC)  12/26/2015  . Myasthenia gravis (HCC) 12/26/2015    Past Surgical History:  Procedure Laterality Date  . AMPUTATION TOE Left 12/29/2015   Procedure: AMPUTATION TOE;  Surgeon: Gwyneth Revels, DPM;  Location: [facility] ORS;  Service: Podiatry;  Laterality: Left;  . PERIPHERAL VASCULAR CATHETERIZATION N/A 12/27/2015   Procedure: Lower Extremity Angiography;  Surgeon: Annice Needy, MD;  Location: [facility] INVASIVE CV LAB;  Service: Cardiovascular;  Laterality: N/A;  . right hip surgery    . right shoulder surgery      Prior to Admission medications   Medication Sig Start Date End Date Taking? Authorizing Provider  amiodarone (PACERONE) 200 MG tablet Take 1 tablet (200 mg total) by mouth daily. 10/04/17   Adrian Saran, MD  aspirin EC 325 MG EC tablet Take 1 tablet (325 mg total) by mouth daily. 08/18/17   Auburn Bilberry, MD  B Complex-C (B-COMPLEX WITH VITAMIN C) tablet Take 1 tablet by mouth daily.    [provider]  citalopram (CELEXA) 20 MG tablet Take 1 tablet (20 mg total) by mouth daily. 10/04/17   Adrian Saran, MD  furosemide (LASIX) 40 MG tablet Take 1 tablet (40 mg total) by mouth daily as needed for fluid (if taking prednisone). 10/04/17   Adrian Saran, MD  gabapentin (NEURONTIN) 300 MG capsule Take 2 capsules (600 mg total) by mouth 3 (three) times daily. 10/04/17   Adrian Saran, MD  lidocaine (LIDODERM) 5 % Place 1 patch onto the skin daily. Remove & Discard patch within 12 hours or as directed by MD 10/05/17   Adrian Saran,  MD  metFORMIN (GLUCOPHAGE) 500 MG tablet Take 1 tablet (500 mg total) by mouth daily. 10/04/17   Adrian Saran, MD  metoprolol succinate (TOPROL-XL) 50 MG 24 hr tablet Take 1 tablet (50 mg total) by mouth daily. Take with or immediately following a meal. 10/04/17   Adrian Saran, MD  multivitamin (ONE-A-DAY MEN'S) TABS tablet Take 1 tablet by mouth daily. 08/18/17   Alford Highland, MD  mycophenolate (CELLCEPT) 500 MG tablet Take 1 tablet (500 mg total) by mouth 2 (two) times  daily. Take 1 tablet ( ) by mouth every morning and 2 tablets ( ) by mouth every evening 10/04/17   Adrian Saran, MD  nicotine (NICODERM CQ - DOSED IN MG/24 HOURS) 21 mg/24hr patch Place 1 patch (21 mg total) onto the skin daily. 10/04/17   Adrian Saran, MD  potassium chloride (K-DUR) 10 MEQ tablet Take 1 tablet (10 mEq total) by mouth daily. 10/04/17 10/12/18  Adrian Saran, MD  protein supplement shake (PREMIER PROTEIN) LIQD Take 325 mLs (11 oz total) by mouth 2 (two) times daily between meals. 10/04/17 11/04/17  Adrian Saran, MD    Allergies Other  Family History  Problem Relation Age of Onset  . Rheum arthritis Father     Social History Social History   Tobacco Use  . Smoking status: Current Every Day Smoker    Packs/day: 0.50    Types: Cigarettes  . Smokeless tobacco: Never Used  Substance Use Topics  . Alcohol use: Yes    Comment: Pt had tequila and rum, at least a 5th or a pint a day  . Drug use: No    Review of Systems unable to obtain due to his intoxication  ____________________________________________   PHYSICAL EXAM:  VITAL SIGNS: ED Triage Vitals  Enc Vitals Group     BP 10/05/17 1830 101/71     Pulse Rate 10/05/17 1830 92     Resp 10/05/17 1830 18     Temp --      Temp src --      SpO2 10/05/17 1830 99 %     Weight 10/05/17 1828 175 lb (79.4 kg)     Height 10/05/17 1828  (1.803 m)     Head Circumference --      Peak Flow --      Pain Score 10/05/17 1828 0     Pain Loc --      Pain Edu? --      Excl. in GC? --     Constitutional: sleepy but briefly arousable Eyes: Conjunctivae are normal. PERRL. EOMI. Head: Atraumatic. Nose: No congestion/rhinnorhea. Mouth/Throat: Mucous membranes are moist.  Oropharynx non-erythematous. Neck: No stridor Cardiovascular: Normal rate, regular rhythm. Grossly normal heart sounds.  Good peripheral circulation. Respiratory: Normal respiratory effort.  No retractions. Lungs CTAB. Gastrointestinal: Soft and  nontender. No distention. No abdominal bruits. No CVA tenderness. Musculoskeletal: No lower extremity tenderness nor edema. . Neurologic:  Normal speech and languagewhen awake. No gross focal neurologic deficits are appreciated. Skin:  Skin is warm, dry and intact. No rash noted.   ____________________________________________   LABS (all labs ordered are listed, but only abnormal results are displayed)  Labs Reviewed  COMPREHENSIVE METABOLIC PANEL - Abnormal; Notable for the following components:      Result Value   Potassium 3.3 (*)    CO2 20 (*)    Glucose, Bld 149 (*)    All other components within normal limits  ETHANOL - Abnormal; Notable for the following components:  Alcohol, Ethyl (B) 326 (*)    All other components within normal limits  ACETAMINOPHEN LEVEL - Abnormal; Notable for the following components:   Acetaminophen (Tylenol), Serum <10 (*)    All other components within normal limits  CBC - Abnormal; Notable for the following components:   MCV 78.7 (*)    MCH 24.8 (*)    MCHC 31.5 (*)    RDW 24.9 (*)    All other components within normal limits  SALICYLATE LEVEL  URINE DRUG SCREEN, QUALITATIVE ([facility] ONLY)   ____________________________________________  EKG   ____________________________________________  RADIOLOGY  ED MD interpretation:    Official radiology report(s): No results found.  ____________________________________________   PROCEDURES  Procedure(s) performed:   Procedures  Critical Care performed:   ____________________________________________   INITIAL IMPRESSION / ASSESSMENT AND PLAN / ED COURSE  patient has no history of head trauma. He did mention to the police officers that he was suicidal. She did not mention that to me when I was able to wake him up and talk briefly. We will await tox psychiatry to talk to him but at present he still too intoxicated to do this. I signed the patient out to Dr. Dolores Frame. patient continues to  breathe normally and easily.         ____________________________________________   FINAL CLINICAL IMPRESSION(S) / ED DIAGNOSES  Final diagnoses:  Alcohol abuse  there may be further diagnosis ibut at present he is too intoxicated to tell.   ED Discharge Orders    None       Note:  This document was prepared using Dragon voice recognition software and may include unintentional dictation errors.    Arnaldo Natal, MD 10/05/17 2337    Arnaldo Natal, MD 10/06/17 [phone]

## 2017-10-06 LAB — URINE DRUG SCREEN, QUALITATIVE (ARMC ONLY)
Amphetamines, Ur Screen: NOT DETECTED
BARBITURATES, UR SCREEN: NOT DETECTED
Benzodiazepine, Ur Scrn: POSITIVE — AB
COCAINE METABOLITE, UR ~~LOC~~: NOT DETECTED
Cannabinoid 50 Ng, Ur ~~LOC~~: NOT DETECTED
MDMA (ECSTASY) UR SCREEN: NOT DETECTED
METHADONE SCREEN, URINE: NOT DETECTED
OPIATE, UR SCREEN: NOT DETECTED
Phencyclidine (PCP) Ur S: NOT DETECTED
Tricyclic, Ur Screen: NOT DETECTED

## 2017-10-06 MED ORDER — LORAZEPAM 1 MG PO TABS
ORAL_TABLET | ORAL | Status: AC
  Filled 2017-10-06: qty 1

## 2017-10-06 MED ORDER — VITAMIN B-1 100 MG PO TABS
100.0000 mg | ORAL_TABLET | Freq: Every day | ORAL | Status: DC
  Administered 2017-10-06 – 2017-10-07 (×2): 100 mg via ORAL
  Filled 2017-10-06 (×2): qty 1

## 2017-10-06 MED ORDER — METFORMIN HCL 500 MG PO TABS: 500.0000 mg | ORAL_TABLET | Freq: Once | ORAL | Status: DC

## 2017-10-06 MED ORDER — IBUPROFEN 600 MG PO TABS
600.0000 mg | ORAL_TABLET | Freq: Once | ORAL | Status: AC
  Administered 2017-10-06: 600 mg via ORAL

## 2017-10-06 MED ORDER — HYDROCODONE-ACETAMINOPHEN 5-325 MG PO TABS
1.0000 | ORAL_TABLET | Freq: Once | ORAL | Status: AC
Start: 2017-10-06 — End: 2017-10-06
  Administered 2017-10-06: 1 via ORAL
  Filled 2017-10-06: qty 1

## 2017-10-06 MED ORDER — LORAZEPAM 2 MG PO TABS
0.0000 mg | ORAL_TABLET | Freq: Four times a day (QID) | ORAL | Status: DC
  Administered 2017-10-06 (×3): 1 mg via ORAL
  Filled 2017-10-06: qty 1

## 2017-10-06 MED ORDER — METFORMIN HCL 500 MG PO TABS
500.0000 mg | ORAL_TABLET | Freq: Every day | ORAL | Status: DC
  Administered 2017-10-07: 500 mg via ORAL
  Filled 2017-10-06: qty 1

## 2017-10-06 MED ORDER — HYDROXYZINE HCL 25 MG PO TABS
50.0000 mg | ORAL_TABLET | Freq: Once | ORAL | Status: AC
  Administered 2017-10-06: 50 mg via ORAL
  Filled 2017-10-06: qty 2

## 2017-10-06 MED ORDER — IBUPROFEN 600 MG PO TABS
ORAL_TABLET | ORAL | Status: AC
  Filled 2017-10-06: qty 1

## 2017-10-06 MED ORDER — IBUPROFEN 600 MG PO TABS
600.0000 mg | ORAL_TABLET | Freq: Once | ORAL | Status: AC
  Administered 2017-10-06: 600 mg via ORAL
  Filled 2017-10-06: qty 1

## 2017-10-06 NOTE — ED Notes (Signed)
Pt requested pain medication for his back. Advil administered as ordered.  Pt continuing to ask for more Ativan. RN explained he will need to be re-assessed later - no more medication can be given at this time. Maintained on 15 minute checks and observation by security camera for safety.

## 2017-10-06 NOTE — ED Notes (Signed)
Patient continues to ask for Ativan and pain medications regularly. Maintained on 15 minute checks and observation by security camera for safety.

## 2017-10-06 NOTE — ED Notes (Signed)
Patient received PM snack. 

## 2017-10-06 NOTE — BH Assessment (Signed)
Writer [facility] Attending Phys[facility], Dr. Jennet MaduroPucilowska. She recommended patient be referred to facility to address his substance abuse needs, preferably [facility]. TTS will seek placement.

## 2017-10-06 NOTE — ED Notes (Signed)
Pt ambulating independently to nurses station door. Maintained on 15 minute checks and observation by security camera for safety.

## 2017-10-06 NOTE — BH Assessment (Signed)
Assessment Note  Kurt Horton is an 57 y.o. male who presents the due to voicing SI and abusing alcohol. He was at local hotel, which was living at and they asked him to leave. When law enforcement arrived, the patient was intoxicated and voiced SI. Upon arrival to the ER patient BAC was 326.  With this writer patient denies SI but voice his concerns about his alcohol use. Patient was recently in the ER for similar presentation. Patient denies the use of other mind-altering substances. He also denies the involvement with the legal system and denies having a history of violence.  During the interview, the patient was calm, cooperative and pleasant. He was able to provide the appropriate answer to the question.    Diagnosis: Alcohol Use Disorder  Past Medical History:  Past Medical History:  Diagnosis Date  . A-fib (HCC)   . Alcohol abuse   . Alcohol withdrawal (HCC) 11/12/2016  . Diabetes mellitus without complication (HCC)   . DJD (degenerative joint disease)   . Hypertension   . Myasthenia gravis (HCC)   . Renal disorder     Past Surgical History:  Procedure Laterality Date  . AMPUTATION TOE Left 12/29/2015   Procedure: AMPUTATION TOE;  Surgeon: Gwyneth Revels, DPM;  Location: [facility] ORS;  Service: Podiatry;  Laterality: Left;  . PERIPHERAL VASCULAR CATHETERIZATION N/A 12/27/2015   Procedure: Lower Extremity Angiography;  Surgeon: Annice Needy, MD;  Location: [facility] INVASIVE CV LAB;  Service: Cardiovascular;  Laterality: N/A;  . right hip surgery    . right shoulder surgery      Family History:  Family History  Problem Relation Age of Onset  . Rheum arthritis Father     Social History:  reports that he has been smoking cigarettes.  He has been smoking about 0.50 packs per day. He has never used smokeless tobacco. He reports that he drinks alcohol. He reports that he does not use drugs.  Additional Social History:  Alcohol / Drug Use Pain Medications: See PTA Prescriptions: See  PTA Over the Counter: See PTA History of alcohol / drug use?: Yes Longest period of sobriety (when/how long): Unable to quantify  Negative Consequences of Use: Personal relationships, Work / Programmer, multimedia, Surveyor, quantity Withdrawal Symptoms: Fever / Chills, Weakness, Tremors, Nausea / Vomiting Substance #1 Name of Substance 1: Alcohol 1 - Age of First Use: 18 1 - Amount (size/oz): Unable to quantify 1 - Frequency: Daily 1 - Duration: Unable to quantify 1 - Last Use / Amount: 10/05/2017  CIWA: CIWA-Ar BP: 127/68 Pulse Rate: 95 Nausea and Vomiting: no nausea and no vomiting Tactile Disturbances: none Tremor: two Auditory Disturbances: not present Paroxysmal Sweats: two Visual Disturbances: not present Anxiety: mildly anxious Headache, Fullness in Head: none present Agitation: normal activity Orientation and Clouding of Sensorium: oriented and can do serial additions CIWA-Ar Total: 5 COWS:    Allergies:  Allergies  Allergen Reactions  . Other Other (See Comments)    Patient states he can't take any "mycin" medications because myasthenia gravis.    Home Medications:  (Not in a hospital admission)  OB/GYN Status:  No LMP for male patient.  General Assessment Data Location of Assessment: [facility] ED TTS Assessment: In system Is this a Tele or Face-to-Face Assessment?: Face-to-Face Is this an Initial Assessment or a Re-assessment for this encounter?: Initial Assessment Marital status: Single Maiden name: n/a Is patient pregnant?: No Living Arrangements: Other (Comment)(Homeless) Can pt return to current living arrangement?: Yes Admission Status: Involuntary Is patient  capable of signing voluntary admission?: No(Under IVC) Referral Source: Self/Family/Friend Insurance type: Medicaid  Medical Screening Exam [facility] Walk-in ONLY) Medical Exam completed: Yes  Crisis Care Plan Living Arrangements: Other (Comment)(Homeless) Legal Guardian: Other:(Self) Name of Psychiatrist: Reports of  none Name of Therapist: Reports of none  Education Status Is patient currently in school?: No Is the patient employed, unemployed or receiving disability?: Unemployed, Receiving disability income  Risk to self with the past 6 months Suicidal Ideation: No Has patient been a risk to self within the past 6 months prior to admission? : No Suicidal Intent: No Has patient had any suicidal intent within the past 6 months prior to admission? : No Is patient at risk for suicide?: No Suicidal Plan?: No Has patient had any suicidal plan within the past 6 months prior to admission? : No Access to Means: No What has been your use of drugs/alcohol within the last 12 months?: Alcohol Previous Attempts/Gestures: No How many times?: 0 Other Self Harm Risks: Active Addiction Triggers for Past Attempts: None known Intentional Self Injurious Behavior: None Family Suicide History: No Recent stressful life event(s): Other (Comment)(Active Addiction) Persecutory voices/beliefs?: No Depression: Yes Depression Symptoms: Loss of interest in usual pleasures, Feeling worthless/self pity, Fatigue, Isolating, Guilt, Tearfulness Substance abuse history and/or treatment for substance abuse?: Yes Suicide prevention information given to non-admitted patients: Not applicable  Risk to Others within the past 6 months Homicidal Ideation: No Does patient have any lifetime risk of violence toward others beyond the six months prior to admission? : No Thoughts of Harm to Others: No Current Homicidal Intent: No Current Homicidal Plan: No Access to Homicidal Means: No Identified Victim: Reports of none History of harm to others?: No Assessment of Violence: None Noted Violent Behavior Description: Reports of none Does patient have access to weapons?: No Criminal Charges Pending?: No Does patient have a court date: No Is patient on probation?: No  Psychosis Hallucinations: None noted Delusions: None  noted  Mental Status Report Appearance/Hygiene: Unremarkable, In scrubs Eye Contact: Good Motor Activity: Freedom of movement, Unremarkable Speech: Logical/coherent, Unremarkable Level of Consciousness: Alert Mood: Depressed, Sad, Pleasant Affect: Appropriate to circumstance, Sad, Anxious Anxiety Level: Minimal Thought Processes: Coherent, Relevant Judgement: Unimpaired Orientation: Person, Place, Time, Situation, Appropriate for developmental age Obsessive Compulsive Thoughts/Behaviors: None  Cognitive Functioning Concentration: Normal Memory: Recent Intact, Remote Intact Is patient IDD: No Is patient DD?: No Insight: Poor Impulse Control: Poor Appetite: Fair Have you had any weight changes? : No Change Sleep: No Change Total Hours of Sleep: 8 Vegetative Symptoms: None  ADLScreening [facility] Assessment Services) Patient's cognitive ability adequate to safely complete daily activities?: Yes Patient able to express need for assistance with ADLs?: Yes Independently performs ADLs?: Yes (appropriate for developmental age)  Prior Inpatient Therapy Prior Inpatient Therapy: No  Prior Outpatient Therapy Prior Outpatient Therapy: No Does patient have an ACCT team?: No Does patient have Intensive In-House Services?  : No Does patient have [facility] services? : No Does patient have P4CC services?: No  ADL Screening (condition at time of admission) Patient's cognitive ability adequate to safely complete daily activities?: Yes Is the patient deaf or have difficulty hearing?: No Does the patient have difficulty seeing, even when wearing glasses/contacts?: No Does the patient have difficulty concentrating, remembering, or making decisions?: No Patient able to express need for assistance with ADLs?: Yes Does the patient have difficulty dressing or bathing?: No Independently performs ADLs?: Yes (appropriate for developmental age) Does the patient have difficulty walking or climbing  stairs?: No Weakness of Legs: None Weakness of Arms/Hands: None  Home Assistive Devices/Equipment Home Assistive Devices/Equipment: None  Therapy Consults (therapy consults require a physician order) PT Evaluation Needed: No OT Evalulation Needed: No SLP Evaluation Needed: No Abuse/Neglect Assessment (Assessment to be complete while patient is alone) Abuse/Neglect Assessment Can Be Completed: Yes Physical Abuse: Denies Verbal Abuse: Denies Sexual Abuse: Denies Exploitation of patient/patient's resources: Denies Self-Neglect: Denies Values / Beliefs Cultural Requests During Hospitalization: None Spiritual Requests During Hospitalization: None Consults Spiritual Care Consult Needed: No Social Work Consult Needed: No         Child/Adolescent Assessment Running Away Risk: Denies(Patient is an adult)  Disposition:  Disposition Initial Assessment Completed for this Encounter: Yes  On Site Evaluation by:   Reviewed with Physician:    Lilyan Gilford MS, LCAS, LPC, NCC, CCSI Therapeutic Triage Specialist 10/06/2017 8:52 PM

## 2017-10-06 NOTE — ED Provider Notes (Signed)
-----------------------------------------   6:54 AM on 10/06/2017 -----------------------------------------   Blood pressure 105/77, pulse 84, temperature 98.1 F (36.7 C), temperature source Oral, resp. rate 18, height 5\' 11"  (1.803 m), weight 79.4 kg (175 lb), SpO2 100 %.  The patient had no acute events since last update.  He [facility] too [facility].  Will do so this morning after he is awake and sober.  Disposition is pending Psychiatry/Behavioral Medicine team recommendations.     Irean HongSung, Emon Lance J, MD 10/06/17 [phone]

## 2017-10-06 NOTE — ED Notes (Signed)

## 2017-10-06 NOTE — ED Notes (Signed)
Pt now awake requesting soda, pt oriented to water and given as requested, Pt requesting oxycodone for herniated disc pain, Dr Dolores FrameSung notified, orders rx'd

## 2017-10-07 ENCOUNTER — Emergency Department
Admission: EM | Admit: 2017-10-07 | Discharge: 2017-10-08 | Disposition: A | Discharge status: Home / Self Care | Payer: Medicaid Other | Source: Home / Self Care | Attending: Emergency Medicine | Admitting: Emergency Medicine

## 2017-10-07 ENCOUNTER — Encounter: Payer: Self-pay | Admitting: Emergency Medicine

## 2017-10-07 DIAGNOSIS — F10920 Alcohol use, unspecified with intoxication, uncomplicated: Secondary | ICD-10-CM

## 2017-10-07 MED ORDER — LORAZEPAM 2 MG PO TABS: 0.0000 mg | ORAL_TABLET | Freq: Four times a day (QID) | ORAL | Status: DC

## 2017-10-07 MED ORDER — SODIUM CHLORIDE 0.9 % IV BOLUS
1000.0000 mL | Freq: Once | INTRAVENOUS | Status: AC
  Administered 2017-10-08: 1000 mL via INTRAVENOUS

## 2017-10-07 MED ORDER — HYDROXYZINE HCL 25 MG PO TABS
ORAL_TABLET | ORAL | Status: AC
  Filled 2017-10-07: qty 2

## 2017-10-07 MED ORDER — LORAZEPAM 1 MG PO TABS
ORAL_TABLET | ORAL | Status: AC
  Administered 2017-10-07: 1 mg
  Filled 2017-10-07: qty 1

## 2017-10-07 MED ORDER — VITAMIN B-1 100 MG PO TABS: 100.0000 mg | ORAL_TABLET | Freq: Every day | ORAL | Status: DC

## 2017-10-07 MED ORDER — THIAMINE HCL 100 MG/ML IJ SOLN
Freq: Once | INTRAVENOUS | Status: AC
  Administered 2017-10-08: 01:00:00 via INTRAVENOUS
  Filled 2017-10-07: qty 1000

## 2017-10-07 MED ORDER — ACETAMINOPHEN 325 MG PO TABS
650.0000 mg | ORAL_TABLET | Freq: Once | ORAL | Status: AC
  Administered 2017-10-07: 650 mg via ORAL
  Filled 2017-10-07: qty 2

## 2017-10-07 MED ORDER — HYDROXYZINE HCL 25 MG PO TABS
50.0000 mg | ORAL_TABLET | Freq: Once | ORAL | Status: AC
  Administered 2017-10-07: 50 mg via ORAL

## 2017-10-07 MED ORDER — IBUPROFEN 600 MG PO TABS
600.0000 mg | ORAL_TABLET | Freq: Once | ORAL | Status: AC
  Administered 2017-10-07: 600 mg via ORAL

## 2017-10-07 MED ORDER — IBUPROFEN 600 MG PO TABS
ORAL_TABLET | ORAL | Status: AC
  Filled 2017-10-07: qty 1

## 2017-10-07 NOTE — ED Notes (Signed)
Pt has rested with eyes closed in hallway bed for bulk of morning.

## 2017-10-07 NOTE — ED Notes (Signed)
Per order, pt was discharged after receiving discharge instructions and lobby. Pt signed for discharge and indicated readiness. He was in no acute distress upon exit to lobby. He had indicated he had secured a hotel reservation for the evening.

## 2017-10-07 NOTE — ED Notes (Signed)
Patient has been moved from B4 to ED 20.

## 2017-10-07 NOTE — Discharge Instructions (Addendum)
Please seek medical attention and help for any thoughts about wanting to harm yourself, harm others, any concerning change in behavior, severe depression, inappropriate drug use or any other new or concerning symptoms. ° °

## 2017-10-07 NOTE — ED Notes (Signed)
IVC, SOC called 

## 2017-10-07 NOTE — BH Assessment (Addendum)
[place] Referral Form completed for [facility]  Information faxed to [employer] for Auth #.  [facility]([place] [phone]) with  Authorization/Tracking Number [id number]).  _________________________ Patient spoke with ER Staff, requesting to be discharged because he was feeling better.  [facility] and recommended for [facility].

## 2017-10-07 NOTE — ED Notes (Signed)
Pt is speaking with SOC now.

## 2017-10-07 NOTE — Discharge Instructions (Addendum)
Drink alcohol only in moderation.  Return to the ER for worsening symptoms, persistent vomiting, difficulty breathing or other concerns. 
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
## 2017-10-07 NOTE — ED Provider Notes (Signed)
-----------------------------------------   7:03 AM on 10/07/2017 -----------------------------------------   Blood pressure 132/74, pulse 92, temperature 98.1 F (36.7 C), temperature source Oral, resp. rate 18, height 5\' 11"  (1.803 m), weight 79.4 kg (175 lb), SpO2 100 %.  The patient had no acute events since last update.  Calm and cooperative at this time.  Disposition is pending Psychiatry/Behavioral Medicine team recommendations.  The patient is awaiting acceptance to a facility.     Rebecka ApleyWebster, Sharad Vaneaton P, MD 10/07/17 [phone]

## 2017-10-07 NOTE — ED Provider Notes (Signed)
[facility] Emergency Department Provider Note __________________________________________   I have reviewed the triage vital signs and the nursing notes.   HISTORY  Chief Complaint Alcohol intoxication/passing out  History limited by and level 5 caveat due to: Intoxication   HPI Kurt PhenesJohn D Higley is a 57 y.o. male who presents to the emergency department today who presents to the emergency department via EMS after passing out at a bar.  Apparently the patient passed out at the bar and then was placed on the ground.  That is how EMS found the patient.  Patient does admit to drinking alcohol.  Denies any current medical complaints however he appears quite intoxicated.    Per medical record review patient was seen in the emergency department yesterday   Past Medical History:  Diagnosis Date  . A-fib (HCC)   . Alcohol abuse   . Alcohol withdrawal (HCC) 11/12/2016  . Diabetes mellitus without complication (HCC)   . DJD (degenerative joint disease)   . Hypertension   . Myasthenia gravis (HCC)   . Renal disorder     Patient Active Problem List   Diagnosis Date Noted  . Malnutrition of moderate degree 10/04/2017  . Chronic combined systolic and diastolic CHF (congestive heart failure) (HCC) 08/13/2017  . Alcohol withdrawal delirium (HCC) 02/26/2017  . Atrial fibrillation with RVR (HCC) 02/25/2017  . Acute alcoholic intoxication with complication (HCC)   . Alcohol abuse   . Alcohol withdrawal (HCC) 11/13/2016  . Substance induced mood disorder (HCC) 11/13/2016  . SVT (supraventricular tachycardia) (HCC) 11/12/2016  . HTN (hypertension) 11/12/2016  . EtOH dependence (HCC) 11/12/2016  . Lumbar radiculopathy 08/24/2016  . Depression with anxiety 08/24/2016  . Chronic low back pain 01/04/2016  . Pressure ulcer 12/29/2015  . Osteomyelitis (HCC) 12/26/2015  . PVD (peripheral vascular disease) (HCC) 12/26/2015  . Type II diabetes mellitus with manifestations  (HCC) 12/26/2015  . Myasthenia gravis (HCC) 12/26/2015    Past Surgical History:  Procedure Laterality Date  . AMPUTATION TOE Left 12/29/2015   Procedure: AMPUTATION TOE;  Surgeon: Gwyneth RevelsJustin Fowler, DPM;  Location: [facility] ORS;  Service: Podiatry;  Laterality: Left;  . PERIPHERAL VASCULAR CATHETERIZATION N/A 12/27/2015   Procedure: Lower Extremity Angiography;  Surgeon: Annice NeedyJason S Dew, MD;  Location: [facility] INVASIVE CV LAB;  Service: Cardiovascular;  Laterality: N/A;  . right hip surgery    . right shoulder surgery      Prior to Admission medications   Medication Sig Start Date End Date Taking? Authorizing Provider  amiodarone (PACERONE) 200 MG tablet Take 1 tablet (200 mg total) by mouth daily. 10/04/17   Adrian SaranMody, Sital, MD  aspirin EC 325 MG EC tablet Take 1 tablet (325 mg total) by mouth daily. 08/18/17   Auburn BilberryPatel, Shreyang, MD  B Complex-C (B-COMPLEX WITH VITAMIN C) tablet Take 1 tablet by mouth daily.    [provider]  citalopram (CELEXA) 20 MG tablet Take 1 tablet (20 mg total) by mouth daily. 10/04/17   Adrian SaranMody, Sital, MD  furosemide (LASIX) 40 MG tablet Take 1 tablet (40 mg total) by mouth daily as needed for fluid (if taking prednisone). 10/04/17   Adrian SaranMody, Sital, MD  gabapentin (NEURONTIN) 300 MG capsule Take 2 capsules (600 mg total) by mouth 3 (three) times daily. 10/04/17   Adrian SaranMody, Sital, MD  lidocaine (LIDODERM) 5 % Place 1 patch onto the skin daily. Remove & Discard patch within 12 hours or as directed by MD 10/05/17   Adrian SaranMody, Sital, MD  metFORMIN (GLUCOPHAGE) 500  MG tablet Take 1 tablet (500 mg total) by mouth daily. 10/04/17   Adrian Saran, MD  metoprolol succinate (TOPROL-XL) 50 MG 24 hr tablet Take 1 tablet (50 mg total) by mouth daily. Take with or immediately following a meal. 10/04/17   Adrian Saran, MD  multivitamin (ONE-A-DAY MEN'S) TABS tablet Take 1 tablet by mouth daily. 08/18/17   Alford Highland, MD  mycophenolate (CELLCEPT) 500 MG tablet Take 1 tablet (500 mg total) by mouth 2 (two) times  daily. Take 1 tablet (500MG ) by mouth every morning and 2 tablets (1000MG ) by mouth every evening 10/04/17   Adrian Saran, MD  nicotine (NICODERM CQ - DOSED IN MG/24 HOURS) 21 mg/24hr patch Place 1 patch (21 mg total) onto the skin daily. Patient not taking: Reported on 10/06/2017 10/04/17   Adrian Saran, MD  potassium chloride (K-DUR) 10 MEQ tablet Take 1 tablet (10 mEq total) by mouth daily. 10/04/17 09/11/2018  Adrian Saran, MD  protein supplement shake (PREMIER PROTEIN) LIQD Take 325 mLs (11 oz total) by mouth 2 (two) times daily between meals. 10/04/17 11/04/17  Adrian Saran, MD    Allergies Other  Family History  Problem Relation Age of Onset  . Rheum arthritis Father     Social History Social History   Tobacco Use  . Smoking status: Current Every Day Smoker    Packs/day: 0.50    Types: Cigarettes  . Smokeless tobacco: Never Used  Substance Use Topics  . Alcohol use: Yes    Comment: Pt had tequila and rum, at least a 5th or a pint a day  . Drug use: No    Review of Systems Unable to obtain secondary to intoxication ____________________________________________   PHYSICAL EXAM:  VITAL SIGNS: ED Triage Vitals  Enc Vitals Group     BP 114/80     Pulse 86     Resp 16     Temp 97.3     Temp src      SpO2 98    Constitutional: Awake, intoxicated Eyes: Conjunctivae are normal.  ENT      Head: Normocephalic and atraumatic.      Nose: No congestion/rhinnorhea.      Mouth/Throat: Mucous membranes are moist.      Neck: No stridor. Hematological/Lymphatic/Immunilogical: No cervical lymphadenopathy. Cardiovascular: Normal rate, regular rhythm.  No murmurs, rubs, or gallops.  Respiratory: Normal respiratory effort without tachypnea nor retractions. Breath sounds are clear and equal bilaterally. No wheezes/rales/rhonchi. Gastrointestinal: Soft and non tender. No rebound. No guarding.  Genitourinary: Deferred Musculoskeletal: Normal range of motion in all extremities. No lower  extremity edema. Neurologic:  Intoxicated, appears to move all extremities. Skin:  Skin is warm, dry and intact. No rash noted.  ____________________________________________    LABS (pertinent positives/negatives)  None  ____________________________________________   EKG  None  ____________________________________________    RADIOLOGY  None  ____________________________________________   PROCEDURES  Procedures  ____________________________________________   INITIAL IMPRESSION / ASSESSMENT AND PLAN / ED COURSE  Pertinent labs & imaging results that were available during my care of the patient were reviewed by me and considered in my medical decision making (see chart for details).   Patient presents to the emergency department today via EMS after passing out while drinking at a bar.  Patient does appear quite intoxicated.  Patient was seen in the emergency department last night for similar symptoms.  Will observe until sober.   ____________________________________________   FINAL CLINICAL IMPRESSION(S) / ED DIAGNOSES  Final diagnoses:  Alcoholic intoxication  without complication [facility])     Note: This dictation was prepared with Dragon dictation. Any transcriptional errors that result from this process are unintentional      Phineas Semen, MD 10/07/17 [phone]
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
## 2017-10-08 LAB — ETHANOL: ALCOHOL ETHYL (B): 269 mg/dL — AB (ref <10)

## 2017-10-08 MED ORDER — LORAZEPAM 2 MG/ML IJ SOLN
INTRAMUSCULAR | Status: AC
  Administered 2017-10-08: 1 mg
  Filled 2017-10-08: qty 1

## 2017-10-08 NOTE — ED Notes (Addendum)
Patient states he was ready to go and is ambulatory.  Pt denies any pain, and states " I just want to get back to my hotel room".  MD notified.  Patient is going to charge his iPhone in the lobby and call for an [place].

## 2017-10-10 ENCOUNTER — Emergency Department
Admission: EM | Admit: 2017-10-10 | Discharge: 2017-10-11 | Disposition: A | Discharge status: Home / Self Care | Payer: Medicaid Other | Attending: Student in an Organized Health Care Education/Training Program | Admitting: Student in an Organized Health Care Education/Training Program

## 2017-10-10 DIAGNOSIS — I5042 Chronic combined systolic (congestive) and diastolic (congestive) heart failure: Secondary | ICD-10-CM | POA: Insufficient documentation

## 2017-10-10 DIAGNOSIS — E119 Type 2 diabetes mellitus without complications: Secondary | ICD-10-CM | POA: Insufficient documentation

## 2017-10-10 DIAGNOSIS — F10929 Alcohol use, unspecified with intoxication, unspecified: Secondary | ICD-10-CM | POA: Insufficient documentation

## 2017-10-10 DIAGNOSIS — Z79899 Other long term (current) drug therapy: Secondary | ICD-10-CM | POA: Diagnosis not present

## 2017-10-10 DIAGNOSIS — F17210 Nicotine dependence, cigarettes, uncomplicated: Secondary | ICD-10-CM | POA: Insufficient documentation

## 2017-10-10 DIAGNOSIS — I110 Hypertensive heart disease with heart failure: Secondary | ICD-10-CM | POA: Diagnosis not present

## 2017-10-10 DIAGNOSIS — Z89422 Acquired absence of other left toe(s): Secondary | ICD-10-CM | POA: Diagnosis not present

## 2017-10-10 DIAGNOSIS — Y908 Blood alcohol level of 240 mg/100 ml or more: Secondary | ICD-10-CM | POA: Diagnosis not present

## 2017-10-10 DIAGNOSIS — Z7982 Long term (current) use of aspirin: Secondary | ICD-10-CM | POA: Insufficient documentation

## 2017-10-10 LAB — CBC WITH DIFFERENTIAL/PLATELET
Basophils Absolute: 0.1 10*3/uL (ref 0–0.1)
Basophils Relative: 4 %
Eosinophils Absolute: 0 10*3/uL (ref 0–0.7)
Eosinophils Relative: 1 %
HEMATOCRIT: 37.9 % — AB (ref 40.0–52.0)
HEMOGLOBIN: 11.9 g/dL — AB (ref 13.0–18.0)
LYMPHS ABS: 1.7 10*3/uL (ref 1.0–3.6)
Lymphocytes Relative: 51 %
MCH: 24.6 pg — AB (ref 26.0–34.0)
MCHC: 31.4 g/dL — AB (ref 32.0–36.0)
MCV: 78.4 fL — ABNORMAL LOW (ref 80.0–100.0)
MONOS PCT: 13 %
Monocytes Absolute: 0.4 10*3/uL (ref 0.2–1.0)
NEUTROS ABS: 1 10*3/uL — AB (ref 1.4–6.5)
Neutrophils Relative %: 31 %
Platelets: 322 10*3/uL (ref 150–440)
RBC: 4.84 MIL/uL (ref 4.40–5.90)
RDW: 25.3 % — ABNORMAL HIGH (ref 11.5–14.5)
WBC: 3.3 10*3/uL — AB (ref 3.8–10.6)

## 2017-10-10 LAB — COMPREHENSIVE METABOLIC PANEL
ALBUMIN: 3.7 g/dL (ref 3.5–5.0)
ALK PHOS: 68 U/L (ref 38–126)
ALT: 34 U/L (ref 17–63)
ANION GAP: 11 (ref 5–15)
AST: 37 U/L (ref 15–41)
BILIRUBIN TOTAL: 0.2 mg/dL — AB (ref 0.3–1.2)
BUN: 9 mg/dL (ref 6–20)
CALCIUM: 8.1 mg/dL — AB (ref 8.9–10.3)
CO2: 24 mmol/L (ref 22–32)
CREATININE: 0.77 mg/dL (ref 0.61–1.24)
Chloride: 104 mmol/L (ref 101–111)
GFR calc non Af Amer: >60 mL/min (ref >60)
GLUCOSE: 122 mg/dL — AB (ref 65–99)
Potassium: 3 mmol/L — ABNORMAL LOW (ref 3.5–5.1)
Sodium: 139 mmol/L (ref 135–145)
TOTAL PROTEIN: 6.5 g/dL (ref 6.5–8.1)

## 2017-10-10 LAB — ACETAMINOPHEN LEVEL: Acetaminophen (Tylenol), Serum: <10 ug/mL — ABNORMAL LOW (ref 10–30)

## 2017-10-10 LAB — ETHANOL: Alcohol, Ethyl (B): 400 mg/dL (ref <10)

## 2017-10-10 MED ORDER — LORAZEPAM 2 MG PO TABS
0.0000 mg | ORAL_TABLET | Freq: Four times a day (QID) | ORAL | Status: DC
  Administered 2017-10-11: 1 mg via ORAL
  Filled 2017-10-10: qty 1

## 2017-10-10 MED ORDER — IBUPROFEN 600 MG PO TABS
600.0000 mg | ORAL_TABLET | Freq: Once | ORAL | Status: AC
  Administered 2017-10-10: 600 mg via ORAL

## 2017-10-10 MED ORDER — LORAZEPAM 2 MG/ML IJ SOLN
1.0000 mg | Freq: Once | INTRAMUSCULAR | Status: AC
  Administered 2017-10-10: 1 mg via INTRAVENOUS

## 2017-10-10 MED ORDER — IBUPROFEN 600 MG PO TABS
ORAL_TABLET | ORAL | Status: AC
  Filled 2017-10-10: qty 1

## 2017-10-10 MED ORDER — LORAZEPAM 2 MG/ML IJ SOLN
0.0000 mg | Freq: Four times a day (QID) | INTRAMUSCULAR | Status: DC
  Administered 2017-10-10 – 2017-10-11 (×2): 2 mg via INTRAVENOUS
  Filled 2017-10-10 (×2): qty 1

## 2017-10-10 MED ORDER — CHLORDIAZEPOXIDE HCL 25 MG PO CAPS
50.0000 mg | ORAL_CAPSULE | Freq: Once | ORAL | Status: AC
  Administered 2017-10-10: 50 mg via ORAL
  Filled 2017-10-10: qty 2

## 2017-10-10 MED ORDER — LORAZEPAM 2 MG/ML IJ SOLN
INTRAMUSCULAR | Status: AC
  Filled 2017-10-10: qty 1

## 2017-10-10 MED ORDER — NICOTINE 14 MG/24HR TD PT24
14.0000 mg | MEDICATED_PATCH | Freq: Once | TRANSDERMAL | Status: DC
  Administered 2017-10-10: 14 mg via TRANSDERMAL

## 2017-10-10 MED ORDER — VITAMIN B-1 100 MG PO TABS: 100.0000 mg | ORAL_TABLET | Freq: Every day | ORAL | Status: DC

## 2017-10-10 MED ORDER — NICOTINE 14 MG/24HR TD PT24
MEDICATED_PATCH | TRANSDERMAL | Status: AC
  Filled 2017-10-10: qty 1

## 2017-10-10 MED ORDER — LORAZEPAM 2 MG/ML IJ SOLN: 0.0000 mg | Freq: Two times a day (BID) | INTRAMUSCULAR | Status: DC

## 2017-10-10 MED ORDER — SODIUM CHLORIDE 0.9 % IV BOLUS
1000.0000 mL | Freq: Once | INTRAVENOUS | Status: AC
  Administered 2017-10-10: 1000 mL via INTRAVENOUS

## 2017-10-10 MED ORDER — THIAMINE HCL 100 MG/ML IJ SOLN
100.0000 mg | Freq: Every day | INTRAMUSCULAR | Status: DC
  Administered 2017-10-10: 100 mg via INTRAVENOUS
  Filled 2017-10-10: qty 2

## 2017-10-10 MED ORDER — LORAZEPAM 2 MG PO TABS: 0.0000 mg | ORAL_TABLET | Freq: Two times a day (BID) | ORAL | Status: DC
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
## 2017-10-10 NOTE — ED Provider Notes (Signed)
 -----------------------------------------   5:37 PM on 10/10/2017 -----------------------------------------  Patient is awake and alert, calm. Vital signs remained normal. We'll do a trial of oral intake. Plan to discharge when clinically sober.  ----------------------------------------- 7:56 PM on 10/10/2017 -----------------------------------------  Patient tolerating oral intake, clear speech, but unfortunately still unsteady on his feet. He unfortunately has been trying to smoke in the ED. We'll remove his belongings to maintain a safe environment.   Sharman CheekStafford, Dyrell Tuccillo, MD 10/12/17 2052

## 2017-10-10 NOTE — ED Notes (Signed)
Pt given [place] sandwich tray, ginger ale

## 2017-10-10 NOTE — ED Notes (Signed)
Pt found smoking in room again and moved into the hallway for supervision. Pt was hiding cigarettes under leg cigarettes and lighter removed from pt possession.

## 2017-10-10 NOTE — ED Notes (Signed)
Patient found in the room smoking cigarettes.  Patient instructed that he cannot be smoking in the facility.  Cigarettes removed from patient reach at this time.  MD and RN notified.

## 2017-10-10 NOTE — ED Notes (Signed)
Attempted to collect urine, pt unable to give sample at this time.

## 2017-10-10 NOTE — ED Notes (Signed)
Officers at bedside speaking to patient now.

## 2017-10-10 NOTE — ED Notes (Signed)
Pt. Able to move from stretcher to 19H bed.

## 2017-10-10 NOTE — ED Provider Notes (Signed)
[facility] Emergency Department Provider Note    First MD Initiated Contact with Patient 10/10/17 1308     (approximate)  I have reviewed the triage vital signs and the nursing notes.   HISTORY  Chief Complaint Alcohol Intoxication  Level V Caveat:  Acute intoxication  HPI Kurt Horton is a 57 y.o. male presents via EMS after being found unresponsive holiday in.  Patient well-known to this facility with frequent and long-standing known history of alcohol dependence frequently present in the ER acutely intoxicated.  Patient reportedly found in his bed with over 30 empty airplane bottles of liquor.  Patient able to be awoken.  States the last drink was several hours ago.  Denies hitting his head.  Denies any intent for self-harm suicidal ideation.  States he "likes to drink a lot ".    Past Medical History:  Diagnosis Date  . A-fib (HCC)   . Alcohol abuse   . Alcohol withdrawal (HCC) 11/12/2016  . Diabetes mellitus without complication (HCC)   . DJD (degenerative joint disease)   . Hypertension   . Myasthenia gravis (HCC)   . Renal disorder    Family History  Problem Relation Age of Onset  . Rheum arthritis Father    Past Surgical History:  Procedure Laterality Date  . AMPUTATION TOE Left 12/29/2015   Procedure: AMPUTATION TOE;  Surgeon: Gwyneth RevelsJustin Fowler, DPM;  Location: [facility] ORS;  Service: Podiatry;  Laterality: Left;  . PERIPHERAL VASCULAR CATHETERIZATION N/A 12/27/2015   Procedure: Lower Extremity Angiography;  Surgeon: Annice NeedyJason S Dew, MD;  Location: [facility] INVASIVE CV LAB;  Service: Cardiovascular;  Laterality: N/A;  . right hip surgery    . right shoulder surgery     Patient Active Problem List   Diagnosis Date Noted  . Malnutrition of moderate degree 10/04/2017  . Chronic combined systolic and diastolic CHF (congestive heart failure) (HCC) 08/13/2017  . Alcohol withdrawal delirium (HCC) 02/26/2017  . Atrial fibrillation with RVR (HCC) 02/25/2017    . Acute alcoholic intoxication with complication (HCC)   . Alcohol abuse   . Alcohol withdrawal (HCC) 11/13/2016  . Substance induced mood disorder (HCC) 11/13/2016  . SVT (supraventricular tachycardia) (HCC) 11/12/2016  . HTN (hypertension) 11/12/2016  . EtOH dependence (HCC) 11/12/2016  . Lumbar radiculopathy 08/24/2016  . Depression with anxiety 08/24/2016  . Chronic low back pain 01/04/2016  . Pressure ulcer 12/29/2015  . Osteomyelitis (HCC) 12/26/2015  . PVD (peripheral vascular disease) (HCC) 12/26/2015  . Type II diabetes mellitus with manifestations (HCC) 12/26/2015  . Myasthenia gravis (HCC) 12/26/2015      Prior to Admission medications   Medication Sig Start Date End Date Taking? Authorizing Provider  amiodarone (PACERONE) 200 MG tablet Take 1 tablet (200 mg total) by mouth daily. 10/04/17   Adrian SaranMody, Sital, MD  aspirin EC 325 MG EC tablet Take 1 tablet (325 mg total) by mouth daily. 08/18/17   Auburn BilberryPatel, Shreyang, MD  B Complex-C (B-COMPLEX WITH VITAMIN C) tablet Take 1 tablet by mouth daily.    [provider]  citalopram (CELEXA) 20 MG tablet Take 1 tablet (20 mg total) by mouth daily. 10/04/17   Adrian SaranMody, Sital, MD  furosemide (LASIX) 40 MG tablet Take 1 tablet (40 mg total) by mouth daily as needed for fluid (if taking prednisone). 10/04/17   Adrian SaranMody, Sital, MD  gabapentin (NEURONTIN) 300 MG capsule Take 2 capsules (600 mg total) by mouth 3 (three) times daily. 10/04/17   Adrian SaranMody, Sital, MD  lidocaine (  LIDODERM) 5 % Place 1 patch onto the skin daily. Remove & Discard patch within 12 hours or as directed by MD 10/05/17   Adrian Saran, MD  metFORMIN (GLUCOPHAGE) 500 MG tablet Take 1 tablet (500 mg total) by mouth daily. 10/04/17   Adrian Saran, MD  metoprolol succinate (TOPROL-XL) 50 MG 24 hr tablet Take 1 tablet (50 mg total) by mouth daily. Take with or immediately following a meal. 10/04/17   Adrian Saran, MD  multivitamin (ONE-A-DAY MEN'S) TABS tablet Take 1 tablet by mouth daily.  08/18/17   Alford Highland, MD  mycophenolate (CELLCEPT) 500 MG tablet Take 1 tablet (500 mg total) by mouth 2 (two) times daily. Take 1 tablet (500MG ) by mouth every morning and 2 tablets (1000MG ) by mouth every evening 10/04/17   Adrian Saran, MD  nicotine (NICODERM CQ - DOSED IN MG/24 HOURS) 21 mg/24hr patch Place 1 patch (21 mg total) onto the skin daily. Patient not taking: Reported on 10/06/2017 10/04/17   Adrian Saran, MD  potassium chloride (K-DUR) 10 MEQ tablet Take 1 tablet (10 mEq total) by mouth daily. 10/04/17 October 12, 2018  Adrian Saran, MD  protein supplement shake (PREMIER PROTEIN) LIQD Take 325 mLs (11 oz total) by mouth 2 (two) times daily between meals. 10/04/17 11/04/17  Adrian Saran, MD    Allergies Other    Social History Social History   Tobacco Use  . Smoking status: Current Every Day Smoker    Packs/day: 0.50    Types: Cigarettes  . Smokeless tobacco: Never Used  Substance Use Topics  . Alcohol use: Yes    Comment: Pt had tequila and rum, at least a 5th or a pint a day  . Drug use: No    Review of Systems Patient denies headaches, rhinorrhea, blurry vision, numbness, shortness of breath, chest pain, edema, cough, abdominal pain, nausea, vomiting, diarrhea, dysuria, fevers, rashes or hallucinations unless otherwise stated above in HPI. ____________________________________________   PHYSICAL EXAM:  VITAL SIGNS: Vitals:   10/10/17 1307 10/10/17 1312  BP:  133/85  Pulse:  84  Resp:  (!) 22  SpO2: 96% 98%    Constitutional: disheveled appearing, intoxicated but follows two step commans and answers questions appropriately.  Eyes: Conjunctivae are normal.  Head: Atraumatic. Nose: No congestion/rhinnorhea. Mouth/Throat: Mucous membranes are moist.   Neck: No stridor. Painless ROM.  Cardiovascular: Normal rate, regular rhythm. Grossly normal heart sounds.  Good peripheral circulation. Respiratory: Normal respiratory effort.  No retractions. Lungs  CTAB. Gastrointestinal: Soft and nontender. No distention. No abdominal bruits. No CVA tenderness. Genitourinary:  Musculoskeletal: No lower extremity tenderness nor edema.  No joint effusions. Neurologic:  Slurred speech, no gross focal neurologic deficits are appreciated. No facial droop Skin:  No rash noted.  ____________________________________________   LABS (all labs ordered are listed, but only abnormal results are displayed)  Results for orders placed or performed during the hospital encounter of 10/10/17 (from the past 24 hour(s))  CBC with Differential/Platelet     Status: Abnormal   Collection Time: 10/10/17  1:15 PM  Result Value Ref Range   WBC 3.3 (L) 3.8 - 10.6 K/uL   RBC 4.84 4.40 - 5.90 MIL/uL   Hemoglobin 11.9 (L) 13.0 - 18.0 g/dL   HCT 16.1 (L) 09.6 - 04.5 %   MCV 78.4 (L) 80.0 - 100.0 fL   MCH 24.6 (L) 26.0 - 34.0 pg   MCHC 31.4 (L) 32.0 - 36.0 g/dL   RDW 40.9 (H) 81.1 - 91.4 %   Platelets  322 150 - 440 K/uL   Neutrophils Relative % 31 %   Neutro Abs 1.0 (L) 1.4 - 6.5 K/uL   Lymphocytes Relative 51 %   Lymphs Abs 1.7 1.0 - 3.6 K/uL   Monocytes Relative 13 %   Monocytes Absolute 0.4 0.2 - 1.0 K/uL   Eosinophils Relative 1 %   Eosinophils Absolute 0.0 0 - 0.7 K/uL   Basophils Relative 4 %   Basophils Absolute 0.1 0 - 0.1 K/uL  Comprehensive metabolic panel     Status: Abnormal   Collection Time: 10/10/17  1:15 PM  Result Value Ref Range   Sodium 139 135 - 145 mmol/L   Potassium 3.0 (L) 3.5 - 5.1 mmol/L   Chloride 104 101 - 111 mmol/L   CO2 24 22 - 32 mmol/L   Glucose, Bld 122 (H) 65 - 99 mg/dL   BUN 9 6 - 20 mg/dL   Creatinine, Ser 1.61 0.61 - 1.24 mg/dL   Calcium 8.1 (L) 8.9 - 10.3 mg/dL   Total Protein 6.5 6.5 - 8.1 g/dL   Albumin 3.7 3.5 - 5.0 g/dL   AST 37 15 - 41 U/L   ALT 34 17 - 63 U/L   Alkaline Phosphatase 68 38 - 126 U/L   Total Bilirubin 0.2 (L) 0.3 - 1.2 mg/dL   GFR calc non Af Amer >60 >60 mL/min   GFR calc Af Amer >60 >60 mL/min    Anion gap 11 5 - 15  Ethanol     Status: Abnormal   Collection Time: 10/10/17  1:15 PM  Result Value Ref Range   Alcohol, Ethyl (B) 400 (HH) <10 mg/dL  Acetaminophen level     Status: Abnormal   Collection Time: 10/10/17  1:15 PM  Result Value Ref Range   Acetaminophen (Tylenol), Serum <10 (L) 10 - 30 ug/mL   ____________________________________________  EKG My review and personal interpretation at Time: 13:11   Indication: intoxication  Rate: 85  Rhythm: sinus Axis: left Other: normal intervals, nonspecific st abn ____________________________________________  RADIOLOGY   ____________________________________________   PROCEDURES  Procedure(s) performed:  Procedures    Critical Care performed: no ____________________________________________   INITIAL IMPRESSION / ASSESSMENT AND PLAN / ED COURSE  Pertinent labs & imaging results that were available during my care of the patient were reviewed by me and considered in my medical decision making (see chart for details).   DDX: Dehydration, sepsis, pna, uti, hypoglycemia, cva, drug effect, withdrawal, encephalitis   MOSI HANNOLD is a 57 y.o. who presents to the ED with symptoms as described above.  Vital signs are stable.  Patient is profoundly intoxicated but protecting his airway and will communicate and answer questions appropriately.  Denies any SI or HI.  Blood work sent for the above differential.  Will be given thiamine as well as placed on [place] protocol.  We will give IV fluids.  Will observe patient.  Patient will be signed out to oncoming provider pending reassessment for clinical sobriety.      As part of my medical decision making, I reviewed the following data within the electronic MEDICAL RECORD NUMBER Nursing notes reviewed and incorporated, Labs reviewed, notes from prior ED visits.  ____________________________________________   FINAL CLINICAL IMPRESSION(S) / ED DIAGNOSES  Final diagnoses:  Alcoholic  intoxication with complication (HCC)      NEW MEDICATIONS STARTED DURING THIS VISIT:  New Prescriptions   No medications on file     Note:  This document was prepared using  Dragon [employer] engineer and may include unintentional dictation errors.    Willy Eddy, MD 10/10/17 [phone]

## 2017-10-10 NOTE — ED Notes (Signed)
Date and time results received: 10/10/17  Test: ETOH Critical Value: 400  Name of Provider Notified: Roxan Hockeyobinson  Orders Received? Or Actions Taken?: MD notified

## 2017-10-10 NOTE — ED Notes (Addendum)
Viviann SpareSteven RN,  spoke with Beatrice LecherFrank Slifer, pt father who states he would be unable to come get patient until 9-10 am.  Father contact number : [phone]

## 2017-10-11 MED ORDER — DIPHENHYDRAMINE HCL 25 MG PO CAPS
50.0000 mg | ORAL_CAPSULE | Freq: Once | ORAL | Status: AC
  Administered 2017-10-11: 50 mg via ORAL

## 2017-10-11 MED ORDER — DIPHENHYDRAMINE HCL 25 MG PO CAPS
ORAL_CAPSULE | ORAL | Status: AC
  Filled 2017-10-11: qty 2

## 2017-10-11 MED ORDER — ACETAMINOPHEN 325 MG PO TABS
650.0000 mg | ORAL_TABLET | Freq: Once | ORAL | Status: AC
  Administered 2017-10-11: 650 mg via ORAL

## 2017-10-11 MED ORDER — ACETAMINOPHEN 325 MG PO TABS
ORAL_TABLET | ORAL | Status: AC
  Filled 2017-10-11: qty 2

## 2017-10-11 NOTE — ED Notes (Signed)
ED  Is the patient under IVC or is there intent for IVC: no Is the patient medically cleared: Yes.   Is there vacancy in the ED BHU: Yes.   Is the population mix appropriate for patient: Yes.   Is the patient awaiting placement in inpatient or outpatient setting:     Discharge pending Has the patient had a psychiatric consult:  Survey of unit performed for contraband, proper placement and condition of furniture, tampering with fixtures in bathroom, shower, and each patient room: Yes.  ; Findings:  APPEARANCE/BEHAVIOR Calm and cooperative NEURO ASSESSMENT Orientation: oriented x3  Hallucinations: No.None noted (Hallucinations) denies  Speech: Normal Gait: normal RESPIRATORY ASSESSMENT Even  Unlabored respirations  CARDIOVASCULAR ASSESSMENT Pulses equal   regular rate  Skin warm and dry   GASTROINTESTINAL ASSESSMENT no GI complaint EXTREMITIES Full ROM  PLAN OF CARE Provide calm/safe environment. Vital signs assessed twice daily. ED BHU Assessment once each 12-hour shift. Collaborate with TTS daily or as condition indicates. Assure the ED provider has rounded once each shift. Provide and encourage hygiene. Provide redirection as needed. Assess for escalating behavior; address immediately and inform ED provider.  Assess family dynamic and appropriateness for visitation as needed: Yes.  ; If necessary, describe findings:  Educate the patient/family about BHU procedures/visitation: Yes.  ; If necessary, describe findings:

## 2017-10-11 NOTE — ED Notes (Signed)
Pt assisted to bathroom

## 2017-10-11 NOTE — ED Notes (Signed)
Pt. Helped to bathroom, pt. Had normal bowl movement and urinated.  Pt. Back in 19H bed requesting sleep aid.

## 2017-10-11 NOTE — ED Notes (Signed)
Patient lying in hallway bed eyes open, NAD observed and will continue to monitor

## 2017-10-11 NOTE — ED Provider Notes (Signed)
Family has arrived to take the patient home.   Kurt Horton, Kurt Catena E, MD 10/11/17 [phone]

## 2017-10-11 NOTE — ED Notes (Signed)
Pt showered  Breakfast provided  - his father is to pick him up shortly  No apparent/acute distress assessed   CIWA completed  Continue to monitor  Discharge pending

## 2017-10-11 NOTE — ED Notes (Signed)
Pt. Given meal tray upon request and drink.

## 2017-10-11 NOTE — ED Notes (Signed)

## 2017-10-18 ENCOUNTER — Emergency Department
Admission: EM | Admit: 2017-10-18 | Discharge: 2017-10-19 | Disposition: A | Discharge status: Home / Self Care | Payer: Medicaid Other | Attending: Emergency Medicine | Admitting: Emergency Medicine

## 2017-10-18 ENCOUNTER — Encounter: Payer: Self-pay | Admitting: Emergency Medicine

## 2017-10-18 ENCOUNTER — Other Ambulatory Visit: Payer: Self-pay

## 2017-10-18 DIAGNOSIS — E119 Type 2 diabetes mellitus without complications: Secondary | ICD-10-CM | POA: Diagnosis not present

## 2017-10-18 DIAGNOSIS — F17210 Nicotine dependence, cigarettes, uncomplicated: Secondary | ICD-10-CM | POA: Diagnosis not present

## 2017-10-18 DIAGNOSIS — I5042 Chronic combined systolic (congestive) and diastolic (congestive) heart failure: Secondary | ICD-10-CM | POA: Insufficient documentation

## 2017-10-18 DIAGNOSIS — F419 Anxiety disorder, unspecified: Secondary | ICD-10-CM | POA: Diagnosis not present

## 2017-10-18 DIAGNOSIS — F329 Major depressive disorder, single episode, unspecified: Secondary | ICD-10-CM | POA: Diagnosis not present

## 2017-10-18 DIAGNOSIS — F10120 Alcohol abuse with intoxication, uncomplicated: Secondary | ICD-10-CM | POA: Diagnosis not present

## 2017-10-18 DIAGNOSIS — F10920 Alcohol use, unspecified with intoxication, uncomplicated: Secondary | ICD-10-CM

## 2017-10-18 DIAGNOSIS — F1010 Alcohol abuse, uncomplicated: Secondary | ICD-10-CM

## 2017-10-18 DIAGNOSIS — Z7982 Long term (current) use of aspirin: Secondary | ICD-10-CM | POA: Insufficient documentation

## 2017-10-18 DIAGNOSIS — Z79899 Other long term (current) drug therapy: Secondary | ICD-10-CM | POA: Diagnosis not present

## 2017-10-18 DIAGNOSIS — Z7984 Long term (current) use of oral hypoglycemic drugs: Secondary | ICD-10-CM | POA: Diagnosis not present

## 2017-10-18 DIAGNOSIS — I110 Hypertensive heart disease with heart failure: Secondary | ICD-10-CM | POA: Insufficient documentation

## 2017-10-18 DIAGNOSIS — F10929 Alcohol use, unspecified with intoxication, unspecified: Secondary | ICD-10-CM

## 2017-10-18 LAB — COMPREHENSIVE METABOLIC PANEL
ALBUMIN: 3.4 g/dL — AB (ref 3.5–5.0)
ALK PHOS: 61 U/L (ref 38–126)
ALT: 20 U/L (ref 17–63)
AST: 29 U/L (ref 15–41)
Anion gap: 9 (ref 5–15)
BUN: 8 mg/dL (ref 6–20)
CO2: 27 mmol/L (ref 22–32)
CREATININE: 0.58 mg/dL — AB (ref 0.61–1.24)
Calcium: 8 mg/dL — ABNORMAL LOW (ref 8.9–10.3)
Chloride: 105 mmol/L (ref 101–111)
GFR calc Af Amer: >60 mL/min (ref >60)
GFR calc non Af Amer: >60 mL/min (ref >60)
GLUCOSE: 136 mg/dL — AB (ref 65–99)
Potassium: 3.3 mmol/L — ABNORMAL LOW (ref 3.5–5.1)
SODIUM: 141 mmol/L (ref 135–145)
Total Bilirubin: 0.3 mg/dL (ref 0.3–1.2)
Total Protein: 6.2 g/dL — ABNORMAL LOW (ref 6.5–8.1)

## 2017-10-18 LAB — CBC WITH DIFFERENTIAL/PLATELET
BASOS PCT: 2 %
Basophils Absolute: 0.1 10*3/uL (ref 0–0.1)
Eosinophils Absolute: 0.1 10*3/uL (ref 0–0.7)
Eosinophils Relative: 2 %
HEMATOCRIT: 34.5 % — AB (ref 40.0–52.0)
HEMOGLOBIN: 10.9 g/dL — AB (ref 13.0–18.0)
LYMPHS ABS: 2.6 10*3/uL (ref 1.0–3.6)
Lymphocytes Relative: 50 %
MCH: 24.7 pg — ABNORMAL LOW (ref 26.0–34.0)
MCHC: 31.5 g/dL — AB (ref 32.0–36.0)
MCV: 78.2 fL — ABNORMAL LOW (ref 80.0–100.0)
Monocytes Absolute: 0.4 10*3/uL (ref 0.2–1.0)
Monocytes Relative: 8 %
NEUTROS ABS: 1.9 10*3/uL (ref 1.4–6.5)
NEUTROS PCT: 38 %
Platelets: 342 10*3/uL (ref 150–440)
RBC: 4.41 MIL/uL (ref 4.40–5.90)
RDW: 24.7 % — ABNORMAL HIGH (ref 11.5–14.5)
WBC: 5.1 10*3/uL (ref 3.8–10.6)

## 2017-10-18 LAB — SALICYLATE LEVEL: Salicylate Lvl: <7 mg/dL (ref 2.8–30.0)

## 2017-10-18 LAB — CK: Total CK: 102 U/L (ref 49–397)

## 2017-10-18 LAB — ETHANOL
Alcohol, Ethyl (B): 448 mg/dL (ref <10)
Alcohol, Ethyl (B): 212 mg/dL — ABNORMAL HIGH (ref <10)

## 2017-10-18 LAB — ACETAMINOPHEN LEVEL: Acetaminophen (Tylenol), Serum: <10 ug/mL — ABNORMAL LOW (ref 10–30)

## 2017-10-18 LAB — LIPASE, BLOOD: Lipase: 38 U/L (ref 11–51)

## 2017-10-18 MED ORDER — VITAMIN B-1 100 MG PO TABS: 100.0000 mg | ORAL_TABLET | Freq: Every day | ORAL | Status: DC

## 2017-10-18 MED ORDER — NICOTINE 14 MG/24HR TD PT24
14.0000 mg | MEDICATED_PATCH | Freq: Once | TRANSDERMAL | Status: DC
  Administered 2017-10-18: 14 mg via TRANSDERMAL
  Filled 2017-10-18: qty 1

## 2017-10-18 MED ORDER — ACETAMINOPHEN 10 MG/ML IV SOLN
1000.0000 mg | Freq: Once | INTRAVENOUS | Status: AC
  Administered 2017-10-18: 1000 mg via INTRAVENOUS
  Filled 2017-10-18: qty 100

## 2017-10-18 MED ORDER — GI COCKTAIL ~~LOC~~
30.0000 mL | Freq: Once | ORAL | Status: AC
  Administered 2017-10-18: 30 mL via ORAL
  Filled 2017-10-18: qty 30

## 2017-10-18 MED ORDER — LORAZEPAM 2 MG PO TABS: 0.0000 mg | ORAL_TABLET | Freq: Two times a day (BID) | ORAL | Status: DC

## 2017-10-18 MED ORDER — LORAZEPAM 2 MG/ML IJ SOLN
0.0000 mg | Freq: Four times a day (QID) | INTRAMUSCULAR | Status: DC
  Administered 2017-10-18 (×2): 2 mg via INTRAVENOUS
  Filled 2017-10-18 (×2): qty 1

## 2017-10-18 MED ORDER — LORAZEPAM 2 MG/ML IJ SOLN: 2.0000 mg | Freq: Once | INTRAMUSCULAR | Status: DC

## 2017-10-18 MED ORDER — LORAZEPAM 2 MG/ML IJ SOLN: 0.0000 mg | Freq: Two times a day (BID) | INTRAMUSCULAR | Status: DC

## 2017-10-18 MED ORDER — GI COCKTAIL ~~LOC~~
30.0000 mL | Freq: Once | ORAL | Status: AC
Start: 2017-10-18 — End: 2017-10-18
  Administered 2017-10-18: 30 mL via ORAL
  Filled 2017-10-18: qty 30

## 2017-10-18 MED ORDER — THIAMINE HCL 100 MG/ML IJ SOLN
100.0000 mg | Freq: Every day | INTRAMUSCULAR | Status: DC
  Administered 2017-10-18: 100 mg via INTRAVENOUS
  Filled 2017-10-18: qty 2

## 2017-10-18 MED ORDER — LORAZEPAM 2 MG PO TABS: 0.0000 mg | ORAL_TABLET | Freq: Four times a day (QID) | ORAL | Status: DC

## 2017-10-18 NOTE — BH Assessment (Signed)
This Clinical research associatewriter faxed completed Regional Referral Form and supporting documentation to [employer] for auth # [id number] referral.

## 2017-10-18 NOTE — ED Notes (Signed)
Pt given water with ice to drink.

## 2017-10-18 NOTE — ED Notes (Signed)
Pt ambulatory to the bathroom with a walker.

## 2017-10-18 NOTE — BH Assessment (Addendum)
Assessment Note  Kurt Horton is an 57 y.o. male. Patient presents to [facility]ED via EMS after being found intoxicated at a motel this morning. Patient reports " I don't know why I'm here." Per patients chart he was here at [facility] a week ago for similar reasons in addition to endorsing SI. Patient currently denies SI, HI, and AVH. Patient was admitted to ED with a 448 BAC. Patient endorses alcohol use since the age of 44. Patient states he drinks a fifth of liquor daily and can't recall a period of sobriety. Patient denies previous inpatient psychiatric/ substance abuse treatment. Patient expresses interest inpatient substance abuse treatment.   Patient has no current involvement in the legal system.  Patient presented with tremors, and depressed affect during assessment.   Diagnosis: Alcohol Use Disorder, Severe  Past Medical History:  Past Medical History:  Diagnosis Date  . A-fib (HCC)   . Alcohol abuse   . Alcohol withdrawal (HCC) 11/12/2016  . Diabetes mellitus without complication (HCC)   . DJD (degenerative joint disease)   . Hypertension   . Myasthenia gravis (HCC)   . Renal disorder     Past Surgical History:  Procedure Laterality Date  . AMPUTATION TOE Left 12/29/2015   Procedure: AMPUTATION TOE;  Surgeon: Gwyneth Revels, DPM;  Location: [facility] ORS;  Service: Podiatry;  Laterality: Left;  . PERIPHERAL VASCULAR CATHETERIZATION N/A 12/27/2015   Procedure: Lower Extremity Angiography;  Surgeon: Annice Needy, MD;  Location: [facility] INVASIVE CV LAB;  Service: Cardiovascular;  Laterality: N/A;  . right hip surgery    . right shoulder surgery      Family History:  Family History  Problem Relation Age of Onset  . Rheum arthritis Father     Social History:  reports that he has been smoking cigarettes.  He has been smoking about 0.50 packs per day. He has never used smokeless tobacco. He reports that he drinks alcohol. He reports that he does not use drugs.  Additional Social History:   Alcohol / Drug Use Pain Medications: SEE PTA  Prescriptions: SEE PTA  Over the Counter: SEE PTA  History of alcohol / drug use?: Yes Longest period of sobriety (when/how long): Unknown Negative Consequences of Use: Financial, Personal relationships Withdrawal Symptoms: Fever / Chills, Weakness, Nausea / Vomiting, Tremors Substance #1 Name of Substance 1: Alcohol  1 - Age of First Use: 18 1 - Amount (size/oz): a fifth of liquor 1 - Frequency: daily  1 - Duration: Unknown 1 - Last Use / Amount: 10/18/2017  CIWA: CIWA-Ar BP: (!) 144/90 Pulse Rate: (!) 107 Nausea and Vomiting: no nausea and no vomiting Tactile Disturbances: none Tremor: no tremor Auditory Disturbances: not present Paroxysmal Sweats: three Visual Disturbances: not present Anxiety: two Headache, Fullness in Head: none present Agitation: normal activity Orientation and Clouding of Sensorium: oriented and can do serial additions CIWA-Ar Total: 5 COWS:    Allergies:  Allergies  Allergen Reactions  . Nsaids Other (See Comments)    Duodenal ulcers, GI bleeding  . Other Other (See Comments)    Patient states he can't take any "mycin" medications because myasthenia gravis.    Home Medications:  (Not in a hospital admission)  OB/GYN Status:  No LMP for male patient.  General Assessment Data Assessment unable to be completed: (Assessment completed) Location of Assessment: [facility] ED TTS Assessment: In system Is this a Tele or Face-to-Face Assessment?: Face-to-Face Is this an Initial Assessment or a Re-assessment for this encounter?: Initial Assessment Marital status:  [place]ced [place]: N/A Is patient pregnant?: No Pregnancy Status: No Living Arrangements: Other (Comment)(Homeless) Can pt return to current living arrangement?: Yes Admission Status: Voluntary Is patient capable of signing voluntary admission?: Yes Referral Source: Self/Family/Friend Insurance type: Medicaid   Medical Screening Exam [facility]  Walk-in ONLY) Medical Exam completed: Yes  Crisis Care Plan Living Arrangements: Other (Comment)(Homeless) Legal Guardian: Other:(None reported) Name of Psychiatrist: None reported Name of Therapist: None reported   Education Status Is patient currently in school?: No Is the patient employed, unemployed or receiving disability?: Receiving disability income, Unemployed  Risk to self with the past 6 months Suicidal Ideation: No Has patient been a risk to self within the past 6 months prior to admission? : No Suicidal Intent: No Has patient had any suicidal intent within the past 6 months prior to admission? : No Is patient at risk for suicide?: No Suicidal Plan?: No Has patient had any suicidal plan within the past 6 months prior to admission? : No Access to Means: No What has been your use of drugs/alcohol within the last 12 months?: Alcohol Previous Attempts/Gestures: No How many times?: 0 Other Self Harm Risks: Active Addiction Triggers for Past Attempts: None known Intentional Self Injurious Behavior: None Family Suicide History: No Recent stressful life event(s): Other (Comment)(None reported) Persecutory voices/beliefs?: No Depression: Yes Depression Symptoms: Loss of interest in usual pleasures, Feeling worthless/self pity, Fatigue Substance abuse history and/or treatment for substance abuse?: Yes Suicide prevention information given to non-admitted patients: Not applicable  Risk to Others within the past 6 months Homicidal Ideation: No Does patient have any lifetime risk of violence toward others beyond the six months prior to admission? : No Thoughts of Harm to Others: No Current Homicidal Intent: No Current Homicidal Plan: No Access to Homicidal Means: No Identified Victim: None reported History of harm to others?: No Assessment of Violence: None Noted Violent Behavior Description: None reported Does patient have access to weapons?: No Criminal Charges  Pending?: No Does patient have a court date: No Is patient on probation?: No  Psychosis Hallucinations: None noted Delusions: None noted  Mental Status Report Appearance/Hygiene: Unremarkable, In scrubs Eye Contact: Fair Motor Activity: Tremors Speech: Soft Level of Consciousness: Alert Mood: Depressed Affect: Appropriate to circumstance, Anxious, Depressed Anxiety Level: Moderate Thought Processes: Thought Blocking Judgement: Impaired Orientation: Person, Place, Time, Situation, Appropriate for developmental age Obsessive Compulsive Thoughts/Behaviors: None  Cognitive Functioning Concentration: Fair Memory: Recent Impaired, Remote Impaired Is patient IDD: No Is patient DD?: No I IQ score available?: No Insight: Poor Impulse Control: Poor Appetite: Fair Have you had any weight changes? : No Change Sleep: No Change Total Hours of Sleep: 6 Vegetative Symptoms: None  ADLScreening [facility] Assessment Services) Patient's cognitive ability adequate to safely complete daily activities?: Yes Patient able to express need for assistance with ADLs?: Yes Independently performs ADLs?: Yes (appropriate for developmental age)  Prior Inpatient Therapy Prior Inpatient Therapy: No  Prior Outpatient Therapy Prior Outpatient Therapy: No Does patient have an ACCT team?: No Does patient have Intensive In-House Services?  : No Does patient have [facility] services? : No Does patient have P4CC services?: No  ADL Screening (condition at time of admission) Patient's cognitive ability adequate to safely complete daily activities?: Yes Is the patient deaf or have difficulty hearing?: No Does the patient have difficulty seeing, even when wearing glasses/contacts?: No Does the patient have difficulty concentrating, remembering, or making decisions?: No Patient able to express need for assistance with ADLs?: Yes Does the patient  have difficulty dressing or bathing?: No Independently performs  ADLs?: Yes (appropriate for developmental age) Does the patient have difficulty walking or climbing stairs?: No Weakness of Legs: None Weakness of Arms/Hands: None  Home Assistive Devices/Equipment Home Assistive Devices/Equipment: None  Therapy Consults (therapy consults require a physician order) PT Evaluation Needed: No OT Evalulation Needed: No SLP Evaluation Needed: No Abuse/Neglect Assessment (Assessment to be complete while patient is alone) Abuse/Neglect Assessment Can Be Completed: Yes Physical Abuse: Denies Verbal Abuse: Denies Sexual Abuse: Denies Exploitation of patient/patient's resources: Denies Self-Neglect: Denies Values / Beliefs Cultural Requests During Hospitalization: None Spiritual Requests During Hospitalization: None Consults Spiritual Care Consult Needed: No Social Work Consult Needed: No            Disposition:  Disposition Initial Assessment Completed for this Encounter: Yes Patient referred to: Other (Comment)(substance abuse treatment)  On Site Evaluation by:   Reviewed with Physician:    Galen Manila, LPC, LCAS-A 10/18/2017 5:34 PM

## 2017-10-18 NOTE — BH Assessment (Signed)
Referrals sent to the following:   [facility]  [address], [place] [address]  [phone]  [facility] [address]   [address] [place] [address]  [phone]  [facility]  [address] [place] [address] [phone]

## 2017-10-18 NOTE — ED Notes (Signed)
Pt given a ginger ale per request and a urinal.

## 2017-10-18 NOTE — ED Notes (Signed)
Pt 02 saturation dropped down to 80% pt placed on 2L o2 at this time.

## 2017-10-18 NOTE — BH Assessment (Signed)
This writer attempted to completed TTS assessment, however patient is unable to participate due to BAC of 448.

## 2017-10-18 NOTE — ED Provider Notes (Signed)
[facility] Emergency Department Provider Note  ____________________________________________   First MD Initiated Contact with Patient 10/18/17 1051     (approximate)  I have reviewed the triage vital signs and the nursing notes.   HISTORY  Chief Complaint Alcohol Intoxication  Level 5 exemption history limited by the patient's alcohol intoxication  HPI Kurt Horton is a 57 y.o. male who comes to the emergency department via EMS after being found intoxicated at a motel this morning.  The patient is a long-standing history of alcohol abuse and recently separated from his wife and has been increasingly intoxicated recently.  Apparently made staff found the patient surrounded by bottles of liquor difficult to arouse the call 911.  His blood sugar was 150 in route with normal vital signs.   police did place the patient under involuntary commitment for alcohol abuse.  Past Medical History:  Diagnosis Date  . A-fib (HCC)   . Alcohol abuse   . Alcohol withdrawal (HCC) 11/12/2016  . Diabetes mellitus without complication (HCC)   . DJD (degenerative joint disease)   . Hypertension   . Myasthenia gravis (HCC)   . Renal disorder     Patient Active Problem List   Diagnosis Date Noted  . Malnutrition of moderate degree 10/04/2017  . Chronic combined systolic and diastolic CHF (congestive heart failure) (HCC) 08/13/2017  . Alcohol withdrawal delirium (HCC) 02/26/2017  . Atrial fibrillation with RVR (HCC) 02/25/2017  . Acute alcoholic intoxication with complication (HCC)   . Alcohol abuse   . Alcohol withdrawal (HCC) 11/13/2016  . Substance induced mood disorder (HCC) 11/13/2016  . SVT (supraventricular tachycardia) (HCC) 11/12/2016  . HTN (hypertension) 11/12/2016  . EtOH dependence (HCC) 11/12/2016  . Lumbar radiculopathy 08/24/2016  . Depression with anxiety 08/24/2016  . Chronic low back pain 01/04/2016  . Pressure ulcer 12/29/2015  .  Osteomyelitis (HCC) 12/26/2015  . PVD (peripheral vascular disease) (HCC) 12/26/2015  . Type II diabetes mellitus with manifestations (HCC) 12/26/2015  . Myasthenia gravis (HCC) 12/26/2015    Past Surgical History:  Procedure Laterality Date  . AMPUTATION TOE Left 12/29/2015   Procedure: AMPUTATION TOE;  Surgeon: Gwyneth Revels, DPM;  Location: [facility] ORS;  Service: Podiatry;  Laterality: Left;  . PERIPHERAL VASCULAR CATHETERIZATION N/A 12/27/2015   Procedure: Lower Extremity Angiography;  Surgeon: Annice Needy, MD;  Location: [facility] INVASIVE CV LAB;  Service: Cardiovascular;  Laterality: N/A;  . right hip surgery    . right shoulder surgery      Prior to Admission medications   Medication Sig Start Date End Date Taking? Authorizing Provider  amiodarone (PACERONE) 200 MG tablet Take 1 tablet (200 mg total) by mouth daily. 10/04/17   Adrian Saran, MD  aspirin EC 325 MG EC tablet Take 1 tablet (325 mg total) by mouth daily. 08/18/17   Auburn Bilberry, MD  B Complex-C (B-COMPLEX WITH VITAMIN C) tablet Take 1 tablet by mouth daily.    [provider]  citalopram (CELEXA) 20 MG tablet Take 1 tablet (20 mg total) by mouth daily. 10/04/17   Adrian Saran, MD  furosemide (LASIX) 40 MG tablet Take 1 tablet (40 mg total) by mouth daily as needed for fluid (if taking prednisone). 10/04/17   Adrian Saran, MD  gabapentin (NEURONTIN) 300 MG capsule Take 2 capsules (600 mg total) by mouth 3 (three) times daily. 10/04/17   Adrian Saran, MD  lidocaine (LIDODERM) 5 % Place 1 patch onto the skin daily. Remove & Discard patch within  12 hours or as directed by MD 10/05/17   Adrian SaranMody, Sital, MD  metFORMIN (GLUCOPHAGE) 500 MG tablet Take 1 tablet (500 mg total) by mouth daily. 10/04/17   Adrian SaranMody, Sital, MD  metoprolol succinate (TOPROL-XL) 50 MG 24 hr tablet Take 1 tablet (50 mg total) by mouth daily. Take with or immediately following a meal. 10/04/17   Adrian SaranMody, Sital, MD  multivitamin (ONE-A-DAY MEN'S) TABS tablet Take 1 tablet by  mouth daily. 08/18/17   Alford HighlandWieting, Richard, MD  mycophenolate (CELLCEPT) 500 MG tablet Take 1 tablet (500 mg total) by mouth 2 (two) times daily. Take 1 tablet (500MG ) by mouth every morning and 2 tablets (1000MG ) by mouth every evening 10/04/17   Adrian SaranMody, Sital, MD  nicotine (NICODERM CQ - DOSED IN MG/24 HOURS) 21 mg/24hr patch Place 1 patch (21 mg total) onto the skin daily. Patient not taking: Reported on 10/06/2017 10/04/17   Adrian SaranMody, Sital, MD  potassium chloride (K-DUR) 10 MEQ tablet Take 1 tablet (10 mEq total) by mouth daily. 10/04/17 09-02-18  Adrian SaranMody, Sital, MD  protein supplement shake (PREMIER PROTEIN) LIQD Take 325 mLs (11 oz total) by mouth 2 (two) times daily between meals. 10/04/17 11/04/17  Adrian SaranMody, Sital, MD    Allergies Nsaids and Other  Family History  Problem Relation Age of Onset  . Rheum arthritis Father     Social History Social History   Tobacco Use  . Smoking status: Current Every Day Smoker    Packs/day: 0.50    Types: Cigarettes  . Smokeless tobacco: Never Used  Substance Use Topics  . Alcohol use: Yes    Comment: Pt had tequila and rum, at least a 5th or a pint a day  . Drug use: No    Review of Systems Level 5 exemption history limited by the patient's intoxication  ____________________________________________   PHYSICAL EXAM:  VITAL SIGNS: ED Triage Vitals  Enc Vitals Group     BP      Pulse      Resp      Temp      Temp src      SpO2      Weight      Height      Head Circumference      Peak Flow      Pain Score      Pain Loc      Pain Edu?      Excl. in GC?     Constitutional: Heavily intoxicated.  Arouses to painful stimulus although not verbal Eyes: PERRL EOMI. midrange and brisk Head: Atraumatic. Nose: No congestion/rhinnorhea. Mouth/Throat: No trismus no bites to the tongue no fasciculations Neck: No stridor.   Cardiovascular: Normal rate, regular rhythm. Grossly normal heart sounds.  Good peripheral circulation. Respiratory: Normal  respiratory effort.  No retractions. Lungs CTAB and moving good air Gastrointestinal: Soft nontender Musculoskeletal: No lower extremity edema   Neurologic:  Normal speech and language. No gross focal neurologic deficits are appreciated. Skin: Mildly diaphoretic Psychiatric: Heavily intoxicated    ____________________________________________   DIFFERENTIAL includes but not limited to  Alcohol intoxication, alcohol withdrawal, intracerebral hemorrhage, metabolic derangement ____________________________________________   LABS (all labs ordered are listed, but only abnormal results are displayed)  Labs Reviewed  COMPREHENSIVE METABOLIC PANEL - Abnormal; Notable for the following components:      Result Value   Potassium 3.3 (*)    Glucose, Bld 136 (*)    Creatinine, Ser 0.58 (*)    Calcium 8.0 (*)  Total Protein 6.2 (*)    Albumin 3.4 (*)    All other components within normal limits  ETHANOL - Abnormal; Notable for the following components:   Alcohol, Ethyl (B) 448 (*)    All other components within normal limits  ACETAMINOPHEN LEVEL - Abnormal; Notable for the following components:   Acetaminophen (Tylenol), Serum <10 (*)    All other components within normal limits  CBC WITH DIFFERENTIAL/PLATELET - Abnormal; Notable for the following components:   Hemoglobin 10.9 (*)    HCT 34.5 (*)    MCV 78.2 (*)    MCH 24.7 (*)    MCHC 31.5 (*)    RDW 24.7 (*)    All other components within normal limits  SALICYLATE LEVEL  LIPASE, BLOOD  CK  URINALYSIS, COMPLETE (UACMP) WITH MICROSCOPIC  URINE DRUG SCREEN, QUALITATIVE ([facility] ONLY)    Lab work reviewed by me shows significantly elevated ethanol level __________________________________________  EKG  _____  RADIOLOGY   ____________________________________________   PROCEDURES  Procedure(s) performed: no  Procedures  Critical Care performed: no  Observation:  no ____________________________________________   INITIAL IMPRESSION / ASSESSMENT AND PLAN / ED COURSE  Pertinent labs & imaging results that were available during my care of the patient were reviewed by me and considered in my medical decision making (see chart for details).  The patient comes to the emergency department heavily intoxicated not particularly combative or difficult.  He denies suicidal ideation to me at this point and I appreciate that he came to the emergency department on involuntary commitment however I disagree and I have terminated proceedings at this point.  We will observe the patient closely for alcohol withdrawal.  No evidence of trauma so defer neuroimaging at this point unless his alcohol level comes back 0.    ----------------------------------------- 2:38 PM on 10/18/2017 -----------------------------------------  The patient remains sleepy but arousable.  We will continue to observe until clinical sobriety.  Currently not withdrawing from alcohol.  ____________________________________________   FINAL CLINICAL IMPRESSION(S) / ED DIAGNOSES  Final diagnoses:  Alcoholic intoxication with complication (HCC)  Alcohol abuse      NEW MEDICATIONS STARTED DURING THIS VISIT:  New Prescriptions   No medications on file     Note:  This document was prepared using Dragon voice recognition software and may include unintentional dictation errors.     Merrily Brittle, MD 10/18/17 1438

## 2017-10-18 NOTE — ED Notes (Signed)
Pt asking for a dinner tray. Pt states he was not given a tray this evening.

## 2017-10-18 NOTE — ED Notes (Signed)
Date and time results received: 10/18/17   Test: etoh Critical Value: 448  Name of Provider Notified: Rifenbark  Orders Received? Or Actions Taken?: MD notified

## 2017-10-18 NOTE — ED Notes (Addendum)
Pt asking for the nurse, states he wants a nicotine patch. Pt states he smokes about 10 cigarettes per day. Informed the patient that we would have to get an order from the doctor first.

## 2017-10-18 NOTE — ED Triage Notes (Signed)
[facility] Pt moved over to bed and asked "What the fuck yall doing man." Hotel staff called ems for alcohol abuse and being unresponsive. Pt alert and states "I need something for me pain." Pt states he has chronic back pain. Pt denies SI to MD.

## 2017-10-18 NOTE — ED Notes (Signed)
TTS in room to evaluate patient.

## 2017-10-18 NOTE — ED Notes (Signed)
Pt given food tray. Pt requesting ativan. Explained to pt that we need to wait a little longer for more ativan.

## 2017-10-18 NOTE — Discharge Instructions (Signed)
Both [facility] and [facility] are full tonight.  We will let you go.  Call them in the morning they should have it bed by then.  Return here for any further problems.

## 2017-10-19 MED ORDER — CHLORDIAZEPOXIDE HCL 5 MG PO CAPS
5.0000 mg | ORAL_CAPSULE | Freq: Once | ORAL | Status: AC
  Administered 2017-10-19: 5 mg via ORAL
  Filled 2017-10-19: qty 1

## 2017-10-19 NOTE — ED Notes (Signed)
[facility]. [employer] called to transport.

## 2017-10-19 NOTE — ED Notes (Addendum)
[employer] called to state that [place] was [place] because [facility].

## 2017-10-19 NOTE — ED Provider Notes (Signed)
-----------------------------------------   12:13 AM on 10/19/2017 -----------------------------------------  Patient is much better.  [facility] and [facility] are full tonight.  Plan is to discharge the patient he can call them in the morning.  We will give him some Librium for tonight by mouth.  If need be he can return.   Arnaldo NatalMalinda, Paul F, MD 10/19/17 [phone]

## 2017-10-19 NOTE — ED Notes (Signed)
Pt [facility] and [facility]'s where his belongings are.

## 2017-10-21 ENCOUNTER — Encounter: Payer: Self-pay | Admitting: Emergency Medicine

## 2017-10-21 ENCOUNTER — Inpatient Hospital Stay
Admission: EM | Admit: 2017-10-21 | Discharge: 2017-10-23 | DRG: 897 | Disposition: A | Discharge status: Home / Self Care | Payer: Medicaid Other | Attending: Internal Medicine | Admitting: Internal Medicine

## 2017-10-21 ENCOUNTER — Other Ambulatory Visit: Payer: Self-pay

## 2017-10-21 ENCOUNTER — Emergency Department: Payer: Medicaid Other

## 2017-10-21 DIAGNOSIS — G7000 Myasthenia gravis without (acute) exacerbation: Secondary | ICD-10-CM | POA: Diagnosis present

## 2017-10-21 DIAGNOSIS — E876 Hypokalemia: Secondary | ICD-10-CM | POA: Diagnosis not present

## 2017-10-21 DIAGNOSIS — F10239 Alcohol dependence with withdrawal, unspecified: Secondary | ICD-10-CM | POA: Diagnosis present

## 2017-10-21 DIAGNOSIS — Z89422 Acquired absence of other left toe(s): Secondary | ICD-10-CM

## 2017-10-21 DIAGNOSIS — F329 Major depressive disorder, single episode, unspecified: Secondary | ICD-10-CM | POA: Diagnosis present

## 2017-10-21 DIAGNOSIS — F10939 Alcohol use, unspecified with withdrawal, unspecified: Secondary | ICD-10-CM | POA: Diagnosis present

## 2017-10-21 DIAGNOSIS — F17210 Nicotine dependence, cigarettes, uncomplicated: Secondary | ICD-10-CM | POA: Diagnosis present

## 2017-10-21 DIAGNOSIS — I4891 Unspecified atrial fibrillation: Secondary | ICD-10-CM | POA: Diagnosis present

## 2017-10-21 DIAGNOSIS — I10 Essential (primary) hypertension: Secondary | ICD-10-CM | POA: Diagnosis present

## 2017-10-21 DIAGNOSIS — F10930 Alcohol use, unspecified with withdrawal, uncomplicated: Secondary | ICD-10-CM

## 2017-10-21 DIAGNOSIS — E1140 Type 2 diabetes mellitus with diabetic neuropathy, unspecified: Secondary | ICD-10-CM | POA: Diagnosis present

## 2017-10-21 DIAGNOSIS — Z7984 Long term (current) use of oral hypoglycemic drugs: Secondary | ICD-10-CM | POA: Diagnosis not present

## 2017-10-21 DIAGNOSIS — F10230 Alcohol dependence with withdrawal, uncomplicated: Secondary | ICD-10-CM | POA: Diagnosis present

## 2017-10-21 LAB — COMPREHENSIVE METABOLIC PANEL
ALT: 20 U/L (ref 17–63)
AST: 29 U/L (ref 15–41)
Albumin: 4.1 g/dL (ref 3.5–5.0)
Alkaline Phosphatase: 87 U/L (ref 38–126)
Anion gap: 11 (ref 5–15)
BUN: 13 mg/dL (ref 6–20)
CHLORIDE: 99 mmol/L — AB (ref 101–111)
CO2: 25 mmol/L (ref 22–32)
Calcium: 8.7 mg/dL — ABNORMAL LOW (ref 8.9–10.3)
Creatinine, Ser: 0.72 mg/dL (ref 0.61–1.24)
GFR calc Af Amer: >60 mL/min (ref >60)
GFR calc non Af Amer: >60 mL/min (ref >60)
Glucose, Bld: 105 mg/dL — ABNORMAL HIGH (ref 65–99)
Potassium: 3.8 mmol/L (ref 3.5–5.1)
Sodium: 135 mmol/L (ref 135–145)
Total Bilirubin: 0.8 mg/dL (ref 0.3–1.2)
Total Protein: 7 g/dL (ref 6.5–8.1)

## 2017-10-21 LAB — CBC WITH DIFFERENTIAL/PLATELET
Basophils Absolute: 0.1 10*3/uL (ref 0–0.1)
Basophils Relative: 2 %
EOS ABS: 0.1 10*3/uL (ref 0–0.7)
EOS PCT: 1 %
HCT: 31.9 % — ABNORMAL LOW (ref 40.0–52.0)
Hemoglobin: 10.2 g/dL — ABNORMAL LOW (ref 13.0–18.0)
LYMPHS ABS: 1 10*3/uL (ref 1.0–3.6)
Lymphocytes Relative: 13 %
MCH: 25.2 pg — AB (ref 26.0–34.0)
MCHC: 32.1 g/dL (ref 32.0–36.0)
MCV: 78.6 fL — ABNORMAL LOW (ref 80.0–100.0)
MONO ABS: 0.8 10*3/uL (ref 0.2–1.0)
MONOS PCT: 11 %
Neutro Abs: 5.4 10*3/uL (ref 1.4–6.5)
Neutrophils Relative %: 73 %
PLATELETS: 252 10*3/uL (ref 150–440)
RBC: 4.06 MIL/uL — ABNORMAL LOW (ref 4.40–5.90)
RDW: 23 % — AB (ref 11.5–14.5)
WBC: 7.4 10*3/uL (ref 3.8–10.6)

## 2017-10-21 LAB — ETHANOL

## 2017-10-21 MED ORDER — CITALOPRAM HYDROBROMIDE 20 MG PO TABS
20.0000 mg | ORAL_TABLET | Freq: Every day | ORAL | Status: DC
  Administered 2017-10-22 – 2017-10-23 (×2): 20 mg via ORAL
  Filled 2017-10-21 (×3): qty 1

## 2017-10-21 MED ORDER — LORAZEPAM 2 MG/ML IJ SOLN
1.0000 mg | Freq: Four times a day (QID) | INTRAMUSCULAR | Status: DC | PRN
  Administered 2017-10-21 – 2017-10-22 (×2): 1 mg via INTRAVENOUS
  Filled 2017-10-21 (×2): qty 1

## 2017-10-21 MED ORDER — ENOXAPARIN SODIUM 40 MG/0.4ML ~~LOC~~ SOLN
40.0000 mg | SUBCUTANEOUS | Status: DC
  Administered 2017-10-21 – 2017-10-22 (×2): 40 mg via SUBCUTANEOUS
  Filled 2017-10-21 (×2): qty 0.4

## 2017-10-21 MED ORDER — ACETAMINOPHEN 325 MG PO TABS
650.0000 mg | ORAL_TABLET | Freq: Four times a day (QID) | ORAL | Status: DC | PRN
  Administered 2017-10-23: 650 mg via ORAL
  Filled 2017-10-21: qty 2

## 2017-10-21 MED ORDER — THIAMINE HCL 100 MG/ML IJ SOLN: 100.0000 mg | Freq: Every day | INTRAMUSCULAR | Status: DC

## 2017-10-21 MED ORDER — POTASSIUM CHLORIDE CRYS ER 10 MEQ PO TBCR
10.0000 meq | EXTENDED_RELEASE_TABLET | Freq: Every day | ORAL | Status: DC
  Administered 2017-10-23: 10 meq via ORAL
  Filled 2017-10-21: qty 1

## 2017-10-21 MED ORDER — ASPIRIN EC 325 MG PO TBEC
325.0000 mg | DELAYED_RELEASE_TABLET | Freq: Every day | ORAL | Status: DC
  Administered 2017-10-22 – 2017-10-23 (×2): 325 mg via ORAL
  Filled 2017-10-21 (×3): qty 1

## 2017-10-21 MED ORDER — LORAZEPAM 2 MG/ML IJ SOLN
INTRAMUSCULAR | Status: AC
  Filled 2017-10-21: qty 1

## 2017-10-21 MED ORDER — LORAZEPAM 2 MG/ML IJ SOLN
2.0000 mg | Freq: Once | INTRAMUSCULAR | Status: AC
  Administered 2017-10-21: 2 mg via INTRAVENOUS
  Filled 2017-10-21: qty 1

## 2017-10-21 MED ORDER — PREMIER PROTEIN SHAKE
11.0000 [oz_av] | Freq: Two times a day (BID) | ORAL | Status: DC
  Administered 2017-10-22 – 2017-10-23 (×3): 11 [oz_av] via ORAL

## 2017-10-21 MED ORDER — MYCOPHENOLATE MOFETIL 250 MG PO CAPS
500.0000 mg | ORAL_CAPSULE | Freq: Two times a day (BID) | ORAL | Status: DC
  Filled 2017-10-21: qty 2

## 2017-10-21 MED ORDER — DILTIAZEM HCL-DEXTROSE 100-5 MG/100ML-% IV SOLN (PREMIX)
5.0000 mg/h | INTRAVENOUS | Status: DC
  Administered 2017-10-21: 5 mg/h via INTRAVENOUS
  Administered 2017-10-22: 10 mg/h via INTRAVENOUS
  Filled 2017-10-21 (×2): qty 100

## 2017-10-21 MED ORDER — ADULT MULTIVITAMIN W/MINERALS CH
1.0000 | ORAL_TABLET | Freq: Every day | ORAL | Status: DC
  Administered 2017-10-21 – 2017-10-23 (×3): 1 via ORAL
  Filled 2017-10-21 (×3): qty 1

## 2017-10-21 MED ORDER — LORAZEPAM 2 MG/ML IJ SOLN
1.0000 mg | Freq: Once | INTRAMUSCULAR | Status: DC
  Filled 2017-10-21: qty 1

## 2017-10-21 MED ORDER — ONE-A-DAY MENS PO TABS: 1.0000 | ORAL_TABLET | Freq: Every day | ORAL | Status: DC

## 2017-10-21 MED ORDER — AMIODARONE HCL 200 MG PO TABS
200.0000 mg | ORAL_TABLET | Freq: Every day | ORAL | Status: DC
  Administered 2017-10-21 – 2017-10-23 (×3): 200 mg via ORAL
  Filled 2017-10-21 (×3): qty 1

## 2017-10-21 MED ORDER — MYCOPHENOLATE MOFETIL 250 MG PO CAPS
1000.0000 mg | ORAL_CAPSULE | Freq: Every day | ORAL | Status: DC
  Administered 2017-10-21 – 2017-10-22 (×2): 1000 mg via ORAL
  Filled 2017-10-21 (×3): qty 4

## 2017-10-21 MED ORDER — LORAZEPAM 2 MG/ML IJ SOLN
2.0000 mg | Freq: Once | INTRAMUSCULAR | Status: AC
  Administered 2017-10-21: 2 mg via INTRAVENOUS

## 2017-10-21 MED ORDER — NICOTINE 21 MG/24HR TD PT24
21.0000 mg | MEDICATED_PATCH | Freq: Every day | TRANSDERMAL | Status: DC
  Administered 2017-10-21 – 2017-10-23 (×3): 21 mg via TRANSDERMAL
  Filled 2017-10-21 (×3): qty 1

## 2017-10-21 MED ORDER — THIAMINE HCL 100 MG/ML IJ SOLN
Freq: Once | INTRAVENOUS | Status: AC
  Administered 2017-10-21: 15:00:00 via INTRAVENOUS
  Filled 2017-10-21: qty 1000

## 2017-10-21 MED ORDER — METOPROLOL TARTRATE 5 MG/5ML IV SOLN
5.0000 mg | Freq: Once | INTRAVENOUS | Status: AC
  Administered 2017-10-21: 5 mg via INTRAVENOUS

## 2017-10-21 MED ORDER — LORAZEPAM 1 MG PO TABS
1.0000 mg | ORAL_TABLET | Freq: Four times a day (QID) | ORAL | Status: DC | PRN
  Administered 2017-10-22 – 2017-10-23 (×5): 1 mg via ORAL
  Filled 2017-10-21 (×5): qty 1

## 2017-10-21 MED ORDER — METOPROLOL TARTRATE 5 MG/5ML IV SOLN
INTRAVENOUS | Status: AC
  Administered 2017-10-21: 5 mg via INTRAVENOUS
  Filled 2017-10-21: qty 5

## 2017-10-21 MED ORDER — METOPROLOL TARTRATE 5 MG/5ML IV SOLN: 5.0000 mg | INTRAVENOUS | Status: DC | PRN

## 2017-10-21 MED ORDER — ONDANSETRON HCL 4 MG/2ML IJ SOLN: 4.0000 mg | Freq: Four times a day (QID) | INTRAMUSCULAR | Status: DC | PRN

## 2017-10-21 MED ORDER — LORAZEPAM 2 MG/ML IJ SOLN
1.0000 mg | Freq: Once | INTRAMUSCULAR | Status: AC
  Administered 2017-10-21: 2 mg via INTRAVENOUS
  Filled 2017-10-21 (×3): qty 1

## 2017-10-21 MED ORDER — ONDANSETRON HCL 4 MG PO TABS
4.0000 mg | ORAL_TABLET | Freq: Four times a day (QID) | ORAL | Status: DC | PRN
Start: 2017-10-21 — End: 2017-10-23

## 2017-10-21 MED ORDER — ACETAMINOPHEN 650 MG RE SUPP: 650.0000 mg | Freq: Four times a day (QID) | RECTAL | Status: DC | PRN

## 2017-10-21 MED ORDER — VITAMIN B-1 100 MG PO TABS
100.0000 mg | ORAL_TABLET | Freq: Every day | ORAL | Status: DC
  Administered 2017-10-21 – 2017-10-23 (×3): 100 mg via ORAL
  Filled 2017-10-21 (×3): qty 1

## 2017-10-21 MED ORDER — GABAPENTIN 300 MG PO CAPS
600.0000 mg | ORAL_CAPSULE | Freq: Three times a day (TID) | ORAL | Status: DC
  Administered 2017-10-21 – 2017-10-23 (×5): 600 mg via ORAL
  Filled 2017-10-21 (×5): qty 2

## 2017-10-21 MED ORDER — FOLIC ACID 1 MG PO TABS
1.0000 mg | ORAL_TABLET | Freq: Every day | ORAL | Status: DC
  Administered 2017-10-21 – 2017-10-23 (×3): 1 mg via ORAL
  Filled 2017-10-21 (×3): qty 1

## 2017-10-21 MED ORDER — B COMPLEX-C PO TABS
1.0000 | ORAL_TABLET | Freq: Every day | ORAL | Status: DC
  Administered 2017-10-21 – 2017-10-23 (×3): 1 via ORAL
  Filled 2017-10-21 (×3): qty 1

## 2017-10-21 MED ORDER — MYCOPHENOLATE MOFETIL 250 MG PO CAPS
500.0000 mg | ORAL_CAPSULE | Freq: Every day | ORAL | Status: DC
  Administered 2017-10-22 – 2017-10-23 (×2): 500 mg via ORAL
  Filled 2017-10-21 (×2): qty 2

## 2017-10-21 MED ORDER — METFORMIN HCL 500 MG PO TABS
500.0000 mg | ORAL_TABLET | Freq: Every day | ORAL | Status: DC
  Filled 2017-10-21: qty 1

## 2017-10-21 MED ORDER — FUROSEMIDE 10 MG/ML IJ SOLN
20.0000 mg | Freq: Once | INTRAMUSCULAR | Status: AC
  Administered 2017-10-21: 20 mg via INTRAVENOUS
  Filled 2017-10-21: qty 4

## 2017-10-21 MED ORDER — FUROSEMIDE 40 MG PO TABS: 40.0000 mg | ORAL_TABLET | Freq: Every day | ORAL | Status: DC | PRN

## 2017-10-21 MED ORDER — METOPROLOL SUCCINATE ER 50 MG PO TB24
50.0000 mg | ORAL_TABLET | Freq: Every day | ORAL | Status: DC
  Administered 2017-10-21 – 2017-10-23 (×3): 50 mg via ORAL
  Filled 2017-10-21 (×3): qty 1

## 2017-10-21 MED ORDER — SODIUM CHLORIDE 0.9 % IV SOLN
INTRAVENOUS | Status: DC
  Administered 2017-10-22 – 2017-10-23 (×4): via INTRAVENOUS

## 2017-10-21 NOTE — ED Provider Notes (Signed)
[facility] Emergency Department Provider Note   ____________________________________________   First MD Initiated Contact with Patient 10/21/17 1334     (approximate)  I have reviewed the triage vital signs and the nursing notes.   HISTORY  Chief Complaint Medical Clearance   HPI Kurt Horton is a 57 y.o. male patient who is here last night to try to get detox but there were no beds in [facility] or [facility] tach went home to his mom's house as noted.  He then return here today.  He is somewhat shaky.  He has some edema somewhat worse than yesterday.  He reports is not been taking his Lasix for at least a week.  He still would like detox at all possible.  I have put in a consult.   Past Medical History:  Diagnosis Date  . A-fib (HCC)   . Alcohol abuse   . Alcohol withdrawal (HCC) 11/12/2016  . Diabetes mellitus without complication (HCC)   . DJD (degenerative joint disease)   . Hypertension   . Myasthenia gravis (HCC)   . Renal disorder     Patient Active Problem List   Diagnosis Date Noted  . Malnutrition of moderate degree 02/19/2018  . Chronic combined systolic and diastolic CHF (congestive heart failure) (HCC) 08/13/2017  . Alcohol withdrawal delirium (HCC) 02/26/2017  . Atrial fibrillation with RVR (HCC) 02/25/2017  . Acute alcoholic intoxication with complication (HCC)   . Alcohol abuse   . Alcohol withdrawal (HCC) 11/13/2016  . Substance induced mood disorder (HCC) 11/13/2016  . SVT (supraventricular tachycardia) (HCC) 11/12/2016  . HTN (hypertension) 11/12/2016  . EtOH dependence (HCC) 11/12/2016  . Lumbar radiculopathy 08/24/2016  . Depression with anxiety 08/24/2016  . Chronic low back pain 01/04/2016  . Pressure ulcer 12/29/2015  . Osteomyelitis (HCC) 12/26/2015  . PVD (peripheral vascular disease) (HCC) 12/26/2015  . Type II diabetes mellitus with manifestations (HCC) 12/26/2015  . Myasthenia gravis (HCC) 12/26/2015    Past Surgical  History:  Procedure Laterality Date  . AMPUTATION TOE Left 12/29/2015   Procedure: AMPUTATION TOE;  Surgeon: Gwyneth RevelsJustin Fowler, DPM;  Location: [facility] ORS;  Service: Podiatry;  Laterality: Left;  . PERIPHERAL VASCULAR CATHETERIZATION N/A 12/27/2015   Procedure: Lower Extremity Angiography;  Surgeon: Annice NeedyJason S Dew, MD;  Location: [facility] INVASIVE CV LAB;  Service: Cardiovascular;  Laterality: N/A;  . right hip surgery    . right shoulder surgery      Prior to Admission medications   Medication Sig Start Date End Date Taking? Authorizing Provider  amiodarone (PACERONE) 200 MG tablet Take 1 tablet (200 mg total) by mouth daily. 10/04/17  Yes Adrian SaranMody, Sital, MD  aspirin EC 325 MG EC tablet Take 1 tablet (325 mg total) by mouth daily. 08/18/17  Yes Auburn BilberryPatel, Shreyang, MD  B Complex-C (B-COMPLEX WITH VITAMIN C) tablet Take 1 tablet by mouth daily.   Yes [provider]  citalopram (CELEXA) 20 MG tablet Take 1 tablet (20 mg total) by mouth daily. 10/04/17  Yes Mody, Patricia PesaSital, MD  furosemide (LASIX) 40 MG tablet Take 1 tablet (40 mg total) by mouth daily as needed for fluid (if taking prednisone). 10/04/17  Yes Mody, Patricia PesaSital, MD  gabapentin (NEURONTIN) 300 MG capsule Take 2 capsules (600 mg total) by mouth 3 (three) times daily. 10/04/17  Yes Mody, Sital, MD  lidocaine (LIDODERM) 5 % Place 1 patch onto the skin daily. Remove & Discard patch within 12 hours or as directed by MD 10/05/17  Yes Mody,  Sital, MD  metFORMIN (GLUCOPHAGE) 500 MG tablet Take 1 tablet (500 mg total) by mouth daily. 10/04/17  Yes Mody, Patricia Pesa, MD  metoprolol succinate (TOPROL-XL) 50 MG 24 hr tablet Take 1 tablet (50 mg total) by mouth daily. Take with or immediately following a meal. 10/04/17  Yes Mody, Sital, MD  multivitamin (ONE-A-DAY MEN'S) TABS tablet Take 1 tablet by mouth daily. 08/18/17  Yes Wieting, Richard, MD  mycophenolate (CELLCEPT) 500 MG tablet Take 1 tablet (500 mg total) by mouth 2 (two) times daily. Take 1 tablet (500MG ) by mouth every  morning and 2 tablets (1000MG ) by mouth every evening 10/04/17  Yes Mody, Sital, MD  potassium chloride (K-DUR) 10 MEQ tablet Take 1 tablet (10 mEq total) by mouth daily. 10/04/17 10/01/2018 Yes Mody, Patricia Pesa, MD  protein supplement shake (PREMIER PROTEIN) LIQD Take 325 mLs (11 oz total) by mouth 2 (two) times daily between meals. 10/04/17 11/04/17 Yes Mody, Sital, MD  nicotine (NICODERM CQ - DOSED IN MG/24 HOURS) 21 mg/24hr patch Place 1 patch (21 mg total) onto the skin daily. Patient not taking: Reported on 10/06/2017 10/04/17   Adrian Saran, MD    Allergies Nsaids and Other  Family History  Problem Relation Age of Onset  . Rheum arthritis Father     Social History Social History   Tobacco Use  . Smoking status: Current Every Day Smoker    Packs/day: 0.50    Types: Cigarettes  . Smokeless tobacco: Never Used  Substance Use Topics  . Alcohol use: Yes    Comment: Pt had tequila and rum, at least a 5th or a pint a day  . Drug use: No    Review of Systems  Constitutional: No fever/chills Eyes: No visual changes. ENT: No sore throat. Cardiovascular: Denies chest pain. Respiratory: Denies shortness of breath. Gastrointestinal: No abdominal pain.  No nausea, no vomiting.  No diarrhea.  No constipation. Genitourinary: Negative for dysuria. Musculoskeletal: Negative for back pain. Skin: Negative for rash. Neurological: Negative for headaches, focal weakness   ____________________________________________   PHYSICAL EXAM:  VITAL SIGNS: ED Triage Vitals  Enc Vitals Group     BP      Pulse      Resp      Temp      Temp src      SpO2      Weight      Height      Head Circumference      Peak Flow      Pain Score      Pain Loc      Pain Edu?      Excl. in GC?    Constitutional: Alert and oriented.  Shaky and looks a little anxious Eyes: Conjunctivae are normal.  Head: Atraumatic. Nose: No congestion/rhinnorhea. Mouth/Throat: Mucous membranes are moist.  Oropharynx  non-erythematous. Neck: No stridor.   Cardiovascular: Rapid rate, regular rhythm. Grossly normal heart sounds.  Good peripheral circulation. Respiratory: Normal respiratory effort.  No retractions. Lungs CTAB. Gastrointestinal: Soft and nontender. No distention. No abdominal bruits. No CVA tenderness. Musculoskeletal: No lower extremity tenderness 1+ edema.   Neurologic:  Normal speech and language. No gross focal neurologic deficits are appreciated Skin:  Skin is warm, dry and intact. No rash noted. Psychiatric: Mood and affect are normal. Speech and behavior are normal.  ____________________________________________   LABS (all labs ordered are listed, but only abnormal results are displayed)  Labs Reviewed  COMPREHENSIVE METABOLIC PANEL - Abnormal; Notable for the following components:  Result Value   Chloride 99 (*)    Glucose, Bld 105 (*)    Calcium 8.7 (*)    All other components within normal limits  CBC WITH DIFFERENTIAL/PLATELET - Abnormal; Notable for the following components:   RBC 4.06 (*)    Hemoglobin 10.2 (*)    HCT 31.9 (*)    MCV 78.6 (*)    MCH 25.2 (*)    RDW 23.0 (*)    All other components within normal limits  ETHANOL  CBC  BASIC METABOLIC PANEL   ____________________________________________  EKG  EKG read and interpreted by me shows sinus tachycardia rate of 108 left axis no acute ST-T wave changes there is a lot of artifact present from his tremor. ____________________________________________  RADIOLOGY  ED MD interpretation:    Official radiology report(s): Dg Chest Portable 1 View  Result Date: 10/21/2017 CLINICAL DATA:  Medical clearance. EXAM: PORTABLE CHEST 1 VIEW COMPARISON:  Single-view of the chest 09/30/2017 and 03/19/2017. FINDINGS: The lungs are clear. Heart size is upper normal. No pneumothorax or pleural effusion. No acute bony abnormality. IMPRESSION: No acute disease. Electronically Signed   By: Drusilla Kanner M.D.   On:  10/21/2017 14:21    ____________________________________________   PROCEDURES  Procedure(s) performed:   Procedures  Critical Care performed:   ____________________________________________   INITIAL IMPRESSION / ASSESSMENT AND PLAN / ED COURSE   Patient is now withdrawing from alcohol.  I think he is too sick to send [facility].  We will admit him here and stabilize him.      ____________________________________________   FINAL CLINICAL IMPRESSION(S) / ED DIAGNOSES  Final diagnoses:  Alcohol withdrawal syndrome without complication [facility])     ED Discharge Orders    None       Note:  This document was prepared using Dragon voice recognition software and may include unintentional dictation errors.    Arnaldo Natal, MD 10/21/17 2027

## 2017-10-21 NOTE — BH Assessment (Signed)
This Clinical research associatewriter spoke with [facility] in respect to pt referral for detox. They do have a bed open and will accept the pt providing he is available to complete admission at 1:30 am at [facility]. Pts BAL must be below .2 to be accepted.

## 2017-10-21 NOTE — Progress Notes (Signed)
Patient was shaking a lot.  RN put in a second IV.  CIWA said to give 2 mg of ativan.  [facility] wanted RN to take 2 bottles and get 1mg  from each.  RN removed one bottle containing 2mg  and used only one bottle.    RN called pharmacy to confirm what to give and how to chart,  Nate said to follow the CIWA.  Nate said if there is a discrepancy with the Ativan RN will need to fix it.  [facility] might show 1 extra bottle above count.  Christean GriefShylon M Manda Holstad, RN

## 2017-10-21 NOTE — ED Notes (Addendum)
RN in room to administer ativan. Pt in bed sleeping with snoring respirations. Pt can be aroused by voice. Dr. Allena KatzPatel at bedside, spoke with admitting MD and requested that ativan be held due to pt having snoring respirations. Verbal order given for Lopressor 5 mg IV push. Will hold Ativan

## 2017-10-21 NOTE — H&P (Signed)
[facility] - [place] at [facility]   PATIENT NAME: Kurt Horton    MR#:  [id number]  DATE OF BIRTH:  03/07/1961  DATE OF ADMISSION:  10/21/2017  PRIMARY CARE PHYSICIAN: [name], MD   REQUESTING/REFERRING PHYSICIAN: Dorothea Glassman MD  CHIEF COMPLAINT:   Chief Complaint  Patient presents with  . Medical Clearance    HISTORY OF PRESENT ILLNESS: Kurt Horton  is a 57 y.o. male with a known history of atrial fibrillation, alcohol abuse, alcohol withdrawal, diabetes type 2, essential hypertension, myasthenia gravis who presented with alcohol intoxication in the ER yesterday and was discharged home.  Patient returns today tremulous noted to have elevated heart rate and elevated blood pressure.  Patient is received multiple doses of Ativan in the ER.  Currently is drowsy.   PAST MEDICAL HISTORY:   Past Medical History:  Diagnosis Date  . A-fib (HCC)   . Alcohol abuse   . Alcohol withdrawal (HCC) 11/12/2016  . Diabetes mellitus without complication (HCC)   . DJD (degenerative joint disease)   . Hypertension   . Myasthenia gravis (HCC)   . Renal disorder     PAST SURGICAL HISTORY:  Past Surgical History:  Procedure Laterality Date  . AMPUTATION TOE Left 12/29/2015   Procedure: AMPUTATION TOE;  Surgeon: Gwyneth Revels, DPM;  Location: [facility] ORS;  Service: Podiatry;  Laterality: Left;  . PERIPHERAL VASCULAR CATHETERIZATION N/A 12/27/2015   Procedure: Lower Extremity Angiography;  Surgeon: Annice Needy, MD;  Location: [facility] INVASIVE CV LAB;  Service: Cardiovascular;  Laterality: N/A;  . right hip surgery    . right shoulder surgery      SOCIAL HISTORY:  Social History   Tobacco Use  . Smoking status: Current Every Day Smoker    Packs/day: 0.50    Types: Cigarettes  . Smokeless tobacco: Never Used  Substance Use Topics  . Alcohol use: Yes    Comment: Pt had tequila and rum, at least a 5th or a pint a day    FAMILY HISTORY:  Family History  Problem  Relation Age of Onset  . Rheum arthritis Father     DRUG ALLERGIES:  Allergies  Allergen Reactions  . Nsaids Other (See Comments)    Duodenal ulcers, GI bleeding  . Other Other (See Comments)    Patient states he can't take any "mycin" medications because myasthenia gravis.    REVIEW OF SYSTEMS:   CONSTITUTIONAL: Unable to provide due to him being drowsy MEDICATIONS AT HOME:  Prior to Admission medications   Medication Sig Start Date End Date Taking? Authorizing Provider  amiodarone (PACERONE) 200 MG tablet Take 1 tablet (200 mg total) by mouth daily. 10/04/17   Adrian Saran, MD  aspirin EC 325 MG EC tablet Take 1 tablet (325 mg total) by mouth daily. 08/18/17   Auburn Bilberry, MD  B Complex-C (B-COMPLEX WITH VITAMIN C) tablet Take 1 tablet by mouth daily.    [provider]  citalopram (CELEXA) 20 MG tablet Take 1 tablet (20 mg total) by mouth daily. 10/04/17   Adrian Saran, MD  furosemide (LASIX) 40 MG tablet Take 1 tablet (40 mg total) by mouth daily as needed for fluid (if taking prednisone). 10/04/17   Adrian Saran, MD  gabapentin (NEURONTIN) 300 MG capsule Take 2 capsules (600 mg total) by mouth 3 (three) times daily. 10/04/17   Adrian Saran, MD  lidocaine (LIDODERM) 5 % Place 1 patch onto the skin daily. Remove & Discard patch within 12 hours or  as directed by MD 10/05/17   Adrian SaranMody, Sital, MD  metFORMIN (GLUCOPHAGE) 500 MG tablet Take 1 tablet (500 mg total) by mouth daily. 10/04/17   Adrian SaranMody, Sital, MD  metoprolol succinate (TOPROL-XL) 50 MG 24 hr tablet Take 1 tablet (50 mg total) by mouth daily. Take with or immediately following a meal. 10/04/17   Adrian SaranMody, Sital, MD  multivitamin (ONE-A-DAY MEN'S) TABS tablet Take 1 tablet by mouth daily. 08/18/17   Alford HighlandWieting, Richard, MD  mycophenolate (CELLCEPT) 500 MG tablet Take 1 tablet (500 mg total) by mouth 2 (two) times daily. Take 1 tablet (500MG ) by mouth every morning and 2 tablets (1000MG ) by mouth every evening 10/04/17   Adrian SaranMody, Sital, MD   nicotine (NICODERM CQ - DOSED IN MG/24 HOURS) 21 mg/24hr patch Place 1 patch (21 mg total) onto the skin daily. Patient not taking: Reported on 10/06/2017 10/04/17   Adrian SaranMody, Sital, MD  potassium chloride (K-DUR) 10 MEQ tablet Take 1 tablet (10 mEq total) by mouth daily. 10/04/17 09-01-2018  Adrian SaranMody, Sital, MD  protein supplement shake (PREMIER PROTEIN) LIQD Take 325 mLs (11 oz total) by mouth 2 (two) times daily between meals. 10/04/17 11/04/17  Adrian SaranMody, Sital, MD      PHYSICAL EXAMINATION:   VITAL SIGNS: Blood pressure (!) 164/104, pulse (!) 160, temperature 98.2 F (36.8 C), temperature source Oral, resp. rate (!) 23, weight 81.6 kg (180 lb), SpO2 100 %.  GENERAL:  57 y.o.-year-old patient lying in the bed with no acute distress.  EYES: Pupils equal, round, reactive to light and accommodation. No scleral icterus. Extraocular muscles intact.  HEENT: Head atraumatic, normocephalic. Oropharynx and nasopharynx clear.  NECK:  Supple, no jugular venous distention. No thyroid enlargement, no tenderness.  LUNGS: Normal breath sounds bilaterally, no wheezing, rales,rhonchi or crepitation. No use of accessory muscles of respiration.  CARDIOVASCULAR: S1, S2 tachycardia. No murmurs, rubs, or gallops.  ABDOMEN: Soft, nontender, nondistended. Bowel sounds present. No organomegaly or mass.  EXTREMITIES: 2+ pedal edema, cyanosis, or clubbing.  NEUROLOGIC: Drowsy PSYCHIATRIC: Drowsy SKIN: No obvious rash, lesion, or ulcer.   LABORATORY PANEL:   CBC Recent Labs  Lab 10/18/17 1058 10/21/17 1347  WBC 5.1 7.4  HGB 10.9* 10.2*  HCT 34.5* 31.9*  PLT 342 252  MCV 78.2* 78.6*  MCH 24.7* 25.2*  MCHC 31.5* 32.1  RDW 24.7* 23.0*  LYMPHSABS 2.6 1.0  MONOABS 0.4 0.8  EOSABS 0.1 0.1  BASOSABS 0.1 0.1   ------------------------------------------------------------------------------------------------------------------  Chemistries  Recent Labs  Lab 10/18/17 1058 10/21/17 1347  NA 141 135  K 3.3* 3.8  CL 105  99*  CO2 27 25  GLUCOSE 136* 105*  BUN 8 13  CREATININE 0.58* 0.72  CALCIUM 8.0* 8.7*  AST 29 29  ALT 20 20  ALKPHOS 61 87  BILITOT 0.3 0.8   ------------------------------------------------------------------------------------------------------------------ estimated creatinine clearance is 109.8 mL/min (by C-G formula based on SCr of 0.72 mg/dL). ------------------------------------------------------------------------------------------------------------------ No results for input(s): TSH, T4TOTAL, T3FREE, THYROIDAB in the last 72 hours.  Invalid input(s): FREET3   Coagulation profile No results for input(s): INR, PROTIME in the last 168 hours. ------------------------------------------------------------------------------------------------------------------- No results for input(s): DDIMER in the last 72 hours. -------------------------------------------------------------------------------------------------------------------  Cardiac Enzymes No results for input(s): CKMB, TROPONINI, MYOGLOBIN in the last 168 hours.  Invalid input(s): CK ------------------------------------------------------------------------------------------------------------------ Invalid input(s): POCBNP  ---------------------------------------------------------------------------------------------------------------  Urinalysis    Component Value Date/Time   COLORURINE YELLOW (A) 09/30/2017 1920   APPEARANCEUR CLEAR (A) 09/30/2017 1920   LABSPEC 1.017 09/30/2017 1920   PHURINE 6.0  09/30/2017 1920   GLUCOSEU 50 (A) 09/30/2017 1920   HGBUR NEGATIVE 09/30/2017 1920   BILIRUBINUR NEGATIVE 09/30/2017 1920   KETONESUR 5 (A) 09/30/2017 1920   PROTEINUR 30 (A) 09/30/2017 1920   NITRITE NEGATIVE 09/30/2017 1920   LEUKOCYTESUR NEGATIVE 09/30/2017 1920     RADIOLOGY: Dg Chest Portable 1 View  Result Date: 10/21/2017 CLINICAL DATA:  Medical clearance. EXAM: PORTABLE CHEST 1 VIEW COMPARISON:  Single-view of  the chest 09/30/2017 and 03/19/2017. FINDINGS: The lungs are clear. Heart size is upper normal. No pneumothorax or pleural effusion. No acute bony abnormality. IMPRESSION: No acute disease. Electronically Signed   By: Drusilla Kanner M.D.   On: 10/21/2017 14:21    EKG: Orders placed or performed during the hospital encounter of 10/21/17  . ED EKG  . ED EKG  . EKG 12-Lead  . EKG 12-Lead  . ED EKG  . ED EKG    IMPRESSION AND PLAN: Patient is a 57 year old with known history of alcohol abuse with recurrent admissions for withdrawal  Being readmitted for same  1.  Alcohol withdrawal place patient on CIWA protocol Monitor for any alcohol withdrawal seizure  2.  Sinus tachycardia heart rate in the 150s we will have to monitor patient on telemetry Place him on as needed IV Lopressor Continue Toprol-XL and amiodarone  3.  Essential hypertension Continue therapy with Toprol-XL  4.  Depression continue Celexa  5.  History of myasthenia gravis continue CellCept  All the records are reviewed and case discussed with ED provider. Management plans discussed with the patient, family and they are in agreement.  CODE STATUS:    Code Status Orders  (From admission, onward)        Start     Ordered   10/21/17 1504  Full code  Continuous     10/21/17 1504    Code Status History    Date Active Date Inactive Code Status Order ID Comments User Context   10/10/2017 1314 10/11/2017 1147 Full Code [id number]  Willy Eddy, MD ED   10/05/2017 2214 10/07/2017 2101 Full Code [id number]  Arnaldo Natal, MD ED   10/05/2017 1924 10/05/2017 2214 Full Code [id number]  Arnaldo Natal, MD ED   10/01/2017 0807 August 07, 202019 1650 Full Code [id number]  Barbaraann Rondo, MD Inpatient   08/24/2017 1756 08/27/2017 1849 Full Code [id number]  Enid Baas, MD ED   08/14/2017 0030 08/18/2017 1605 Full Code [id number]  Oralia Manis, MD Inpatient   06/04/2017 0116 06/06/2017 1701 Full Code [id number]  Oralia Manis, MD  Inpatient   03/19/2017 1559 03/20/2017 2128 Full Code [id number]  Arnaldo Natal, MD ED   02/25/2017 1421 03/01/2017 1829 Full Code [id number]  Houston Siren, MD Inpatient   11/12/2016 1533 11/14/2016 1425 Full Code [id number]  Marguarite Arbour, MD Inpatient   12/26/2015 1927 12/30/2015 1604 Full Code [id number]  Marguarite Arbour, MD Inpatient       TOTAL TIME TAKING CARE OF THIS PATIENT: 55 minutes.    Auburn Bilberry M.D on 10/21/2017 at 3:36 PM  Between 7am to 6pm - Pager - [phone]  After 6pm go to [email or web address] - password [employer]  [facility] Office  [phone]  CC: Primary care physician; [name], MD

## 2017-10-21 NOTE — ED Triage Notes (Signed)
Pt to ED via [facility] from home for medical clearance for detox. Pt states that he is an every day drinker. Pt is wanting to detox from alcohol. Pt last drink was today. Pt states that he dank 2 airplane bottles of alcohol. Pt has visible tremors on arrival to ED. Pt denies any hallucinations, N/V, or fullness in the head. Pt is in NAD at this time.

## 2017-10-22 LAB — BASIC METABOLIC PANEL
ANION GAP: 10 (ref 5–15)
BUN: 11 mg/dL (ref 6–20)
CO2: 26 mmol/L (ref 22–32)
Calcium: 8.4 mg/dL — ABNORMAL LOW (ref 8.9–10.3)
Chloride: 101 mmol/L (ref 101–111)
Creatinine, Ser: 0.71 mg/dL (ref 0.61–1.24)
GFR calc Af Amer: >60 mL/min (ref >60)
GLUCOSE: 110 mg/dL — AB (ref 65–99)
POTASSIUM: 3.4 mmol/L — AB (ref 3.5–5.1)
Sodium: 137 mmol/L (ref 135–145)

## 2017-10-22 LAB — CBC
HEMATOCRIT: 31.3 % — AB (ref 40.0–52.0)
Hemoglobin: 9.9 g/dL — ABNORMAL LOW (ref 13.0–18.0)
MCH: 25.2 pg — ABNORMAL LOW (ref 26.0–34.0)
MCHC: 31.6 g/dL — ABNORMAL LOW (ref 32.0–36.0)
MCV: 79.6 fL — AB (ref 80.0–100.0)
PLATELETS: 228 10*3/uL (ref 150–440)
RBC: 3.93 MIL/uL — AB (ref 4.40–5.90)
RDW: 23.4 % — AB (ref 11.5–14.5)
WBC: 4.7 10*3/uL (ref 3.8–10.6)

## 2017-10-22 MED ORDER — METFORMIN HCL 500 MG PO TABS
500.0000 mg | ORAL_TABLET | Freq: Every day | ORAL | Status: DC
  Administered 2017-10-22 – 2017-10-23 (×2): 500 mg via ORAL
  Filled 2017-10-22: qty 1

## 2017-10-22 MED ORDER — PNEUMOCOCCAL VAC POLYVALENT 25 MCG/0.5ML IJ INJ: 0.5000 mL | INJECTION | INTRAMUSCULAR | Status: DC

## 2017-10-22 MED ORDER — POTASSIUM CHLORIDE CRYS ER 20 MEQ PO TBCR
40.0000 meq | EXTENDED_RELEASE_TABLET | Freq: Once | ORAL | Status: AC
  Administered 2017-10-22: 40 meq via ORAL
  Filled 2017-10-22: qty 2

## 2017-10-22 MED ORDER — DILTIAZEM HCL 30 MG PO TABS
60.0000 mg | ORAL_TABLET | Freq: Three times a day (TID) | ORAL | Status: DC
  Administered 2017-10-22 – 2017-10-23 (×4): 60 mg via ORAL
  Filled 2017-10-22 (×4): qty 2

## 2017-10-22 MED ORDER — OXYCODONE-ACETAMINOPHEN 5-325 MG PO TABS
1.0000 | ORAL_TABLET | ORAL | Status: DC | PRN
  Administered 2017-10-22 – 2017-10-23 (×5): 1 via ORAL
  Filled 2017-10-22 (×5): qty 1

## 2017-10-22 NOTE — Progress Notes (Signed)
Patient is alert and oriented, cooperative with care.  Telesitter discontinued.

## 2017-10-22 NOTE — Progress Notes (Signed)
[facility] - [place] at [facility]   PATIENT NAME: Kurt Horton    MR#:  [id number]  DATE OF BIRTH:  [birth year]  SUBJECTIVE:  CHIEF COMPLAINT:   Chief Complaint  Patient presents with  . Medical Clearance   -Sleeping today.  Hand tremors noted.  Still scoring on CIWA -Converted to normal sinus rhythm now.  Cardizem drip being adjusted  REVIEW OF SYSTEMS:  Review of Systems  Constitutional: Negative for chills, fever and malaise/fatigue.  HENT: Negative for congestion, ear discharge, hearing loss and nosebleeds.   Eyes: Negative for blurred vision and double vision.  Respiratory: Negative for cough, shortness of breath and wheezing.   Cardiovascular: Negative for chest pain, palpitations and leg swelling.  Gastrointestinal: Negative for abdominal pain, constipation, diarrhea, nausea and vomiting.  Genitourinary: Negative for dysuria.  Musculoskeletal: Negative for myalgias.  Neurological: Positive for tremors. Negative for dizziness, focal weakness, seizures, weakness and headaches.  Psychiatric/Behavioral: Negative for depression.    DRUG ALLERGIES:   Allergies  Allergen Reactions  . Nsaids Other (See Comments)    Duodenal ulcers, GI bleeding  . Other Other (See Comments)    Patient states he can't take any "mycin" medications because myasthenia gravis.    VITALS:  Blood pressure 109/71, pulse 78, temperature 98.3 F (36.8 C), temperature source Oral, resp. rate 14, weight 83.9 kg (185 lb), SpO2 93 %.  PHYSICAL EXAMINATION:  Physical Exam  GENERAL:  57 y.o.-year-old patient lying in the bed with no acute distress. Falling asleep easily. EYES: Pupils equal, round, reactive to light and accommodation. No scleral icterus. Extraocular muscles intact.  HEENT: Head atraumatic, normocephalic. Oropharynx and nasopharynx clear.  NECK:  Supple, no jugular venous distention. No thyroid enlargement, no tenderness.  LUNGS: Normal breath sounds bilaterally, no  wheezing, rales,rhonchi or crepitation. No use of accessory muscles of respiration. Decreased bibasilar breath sounds CARDIOVASCULAR: S1, S2 normal. No murmurs, rubs, or gallops.  ABDOMEN: Soft, nontender, nondistended. Bowel sounds present. No organomegaly or mass.  EXTREMITIES: No pedal edema, cyanosis, or clubbing. Tremors of hands noted. NEUROLOGIC: Cranial nerves II through XII are intact. Muscle strength 5/5 in all extremities. Sensation intact. Gait not checked.  PSYCHIATRIC: The patient is alert and oriented x 2-3, some confusion noted.  SKIN: No obvious rash, lesion, or ulcer.    LABORATORY PANEL:   CBC Recent Labs  Lab 10/22/17 0425  WBC 4.7  HGB 9.9*  HCT 31.3*  PLT 228   ------------------------------------------------------------------------------------------------------------------  Chemistries  Recent Labs  Lab 10/21/17 1347 10/22/17 0425  NA 135 137  K 3.8 3.4*  CL 99* 101  CO2 25 26  GLUCOSE 105* 110*  BUN 13 11  CREATININE 0.72 0.71  CALCIUM 8.7* 8.4*  AST 29  --   ALT 20  --   ALKPHOS 87  --   BILITOT 0.8  --    ------------------------------------------------------------------------------------------------------------------  Cardiac Enzymes No results for input(s): TROPONINI in the last 168 hours. ------------------------------------------------------------------------------------------------------------------  RADIOLOGY:  Dg Chest Portable 1 View  Result Date: 10/21/2017 CLINICAL DATA:  Medical clearance. EXAM: PORTABLE CHEST 1 VIEW COMPARISON:  Single-view of the chest 09/30/2017 and 03/19/2017. FINDINGS: The lungs are clear. Heart size is upper normal. No pneumothorax or pleural effusion. No acute bony abnormality. IMPRESSION: No acute disease. Electronically Signed   By: Drusilla Kanner M.D.   On: 10/21/2017 14:21    EKG:   Orders placed or performed during the hospital encounter of 10/21/17  . ED EKG  . ED EKG  .  EKG 12-Lead  . EKG  12-Lead  . ED EKG  . ED EKG    ASSESSMENT AND PLAN:   57 year old male with known history of atrial fibrillation not on any anticoagulation, alcohol abuse, diabetes, hypertension and myasthenia presents to hospital secondary to worsening tremors and withdrawal symptoms.  1.  A. fib with RVR-noted to be in atrial fibrillation with rapid ventricular response-started on Cardizem drip -Now converted to normal sinus rhythm.  Will start oral Cardizem and discontinue the drip as tolerated. -Also on oral amiodarone -Not a candidate for anticoagulation with his history of alcohol abuse  2.  Alcohol abuse with withdrawal symptoms-continue CIWA protocol -Continue supplements with thiamine and multivitamins -In the process of getting to an inpatient rehab.  3.  Hypokalemia-replace  4.  Tobacco use disorder-on nicotine patch  5.  Neuropathy-gabapentin  6.  DVT prophylaxis-on Lovenox     All the records are reviewed and case discussed with Care Management/Social Workerr. Management plans discussed with the patient, family and they are in agreement.  CODE STATUS: Full Code  TOTAL TIME TAKING CARE OF THIS PATIENT: 37 minutes.   POSSIBLE D/C IN 2 DAYS, DEPENDING ON CLINICAL CONDITION.   Enid BaasKALISETTI,Naleah Kofoed M.D on 10/22/2017 at 2:21 PM  Between 7am to 6pm - Pager - [phone]  After 6pm go to [email or web address] - Social research officer, governmentpassword EPAS [email or web address]  [facility] [place] [facility]  Office  [phone]  CC: Primary care physician; Barbette ReichmannHande, Vishwanath, MD

## 2017-10-22 NOTE — Progress Notes (Signed)
Cardizem drip decreased to 5 mg/hr at change of shift.  Heart rate was consistently in the 90's and in NSR.  Patient just switched to Afib, Cardizem increased to 10 mg/ hr.

## 2017-10-23 LAB — BASIC METABOLIC PANEL
ANION GAP: 8 (ref 5–15)
BUN: 17 mg/dL (ref 6–20)
CO2: 23 mmol/L (ref 22–32)
Calcium: 8.8 mg/dL — ABNORMAL LOW (ref 8.9–10.3)
Chloride: 106 mmol/L (ref 101–111)
Creatinine, Ser: 0.75 mg/dL (ref 0.61–1.24)
GFR calc Af Amer: >60 mL/min (ref >60)
GLUCOSE: 104 mg/dL — AB (ref 65–99)
POTASSIUM: 4.2 mmol/L (ref 3.5–5.1)
Sodium: 137 mmol/L (ref 135–145)

## 2017-10-23 LAB — MAGNESIUM: Magnesium: 1.8 mg/dL (ref 1.7–2.4)

## 2017-10-23 LAB — PHOSPHORUS: PHOSPHORUS: 3.1 mg/dL (ref 2.5–4.6)

## 2017-10-23 MED ORDER — OXYCODONE-ACETAMINOPHEN 7.5-325 MG PO TABS
1.0000 | ORAL_TABLET | Freq: Four times a day (QID) | ORAL | Status: DC | PRN
  Administered 2017-10-23: 1 via ORAL
  Filled 2017-10-23: qty 1

## 2017-10-23 MED ORDER — CHLORDIAZEPOXIDE HCL 10 MG PO CAPS: 10.0000 mg | ORAL_CAPSULE | Freq: Three times a day (TID) | ORAL | 0 refills | Status: DC

## 2017-10-23 MED ORDER — CHLORDIAZEPOXIDE HCL 10 MG PO CAPS
10.0000 mg | ORAL_CAPSULE | Freq: Three times a day (TID) | ORAL | Status: DC
Start: 2017-10-23 — End: 2017-10-23
  Administered 2017-10-23: 10 mg via ORAL
  Filled 2017-10-23: qty 1

## 2017-10-23 MED ORDER — DILTIAZEM HCL ER COATED BEADS 180 MG PO CP24: 180.0000 mg | ORAL_CAPSULE | Freq: Every day | ORAL | 3 refills | Status: DC

## 2017-10-23 NOTE — Progress Notes (Signed)
Pt discharged to home. Both IV's and tele monitor removed. Reviewed discharge instructions, education, and new medication with pt and he verbalizes understanding. Pt leaving via Medicaid transport services and escorted down to front lobby via wheelchair by volunteer services.

## 2017-10-23 NOTE — Progress Notes (Signed)
Initial Nutrition Assessment  DOCUMENTATION CODES:   Non-severe (moderate) malnutrition in context of social or environmental circumstances  INTERVENTION:   Provide Premier Protein po BID, each supplement provides 160 kcal and 30 grams of protein.  Continue MVI, thiamine and folic acid daily (continue after discharge).  Recommend check Phosphorus lab as pt is at refeed risk   NUTRITION DIAGNOSIS:   Moderate Malnutrition related to social / environmental circumstances(etoh abuse ) as evidenced by mild fat depletion, mild muscle depletion, energy intake < or equal to 50% for > or equal to 1 month.  GOAL:   Patient will meet greater than or equal to 90% of their needs  MONITOR:   PO intake, Supplement acceptance, Labs, Weight trends, I & O's  REASON FOR ASSESSMENT:   Malnutrition Screening Tool    ASSESSMENT:   57 y.o. male with a known history of atrial fibrillation, alcohol abuse, alcohol withdrawal, diabetes type 2, essential hypertension, myasthenia gravis who presented with alcohol intoxication   Pt familiar to nutrition department from previous admit ~2-3 weeks ago. Pt reports poor appetite and oral intake at baseline. He reports drinking rum heavily (10-20 shots per day) for years now, so he does not eat much during the day. He only has one meal per day. It is usually a 12" sub from [employer]. He either gets the ham and [place] or [place] BMT with lettuce, tomato, and black olives. That is a daily intake of approximately 820 kcal per day (41% minimum estimated kcal needs). Patient takes a daily MVI but does not take additional folic acid or thiamine at home. He reported on last admit that he had been having issues with neuropathy and unsteady gait. At that time pt was treated with aggressive thiamine regimen for possible wernicke's and wet beri beri. Pt doing well since admit; eating 100% of meals and drinking Premier Protein. Pt on CIWA protocol. Per chart, pt appears weight  stable.   Medications reviewed and include: aspirin, B-complex with C, celexa, lovenox, folic acid, metformin, MVI, nicotine, KCl, thaimine, NaCl @100ml /hr  Labs reviewed: K 4.2 wnl, Ca 8.8(L), Mg 1.8 wnl Hgb 9.9(L), Hct 31.3(L), MCV 79.6(L), MCH 25.2(L)- 6/17  NUTRITION - FOCUSED PHYSICAL EXAM:    Most Recent Value  Orbital Region  Mild depletion  Upper Arm Region  Moderate depletion  Thoracic and Lumbar Region  Mild depletion  Buccal Region  Mild depletion  Temple Region  Mild depletion  Clavicle Bone Region  Mild depletion  Clavicle and Acromion Bone Region  Mild depletion  Scapular Bone Region  Mild depletion  Dorsal Hand  No depletion  Patellar Region  No depletion  Anterior Thigh Region  No depletion  Posterior Calf Region  No depletion  Edema (RD Assessment)  None  Hair  Reviewed  Eyes  Reviewed  Mouth  Reviewed  Skin  Reviewed  Nails  Reviewed     Diet Order:   Diet Order           Diet - low sodium heart healthy        Diet heart healthy/carb modified Room service appropriate? Yes; Fluid consistency: Thin  Diet effective now         EDUCATION NEEDS:   Education needs have been addressed  Skin:  Skin Assessment: Reviewed RN Assessment  Last BM:  6/17- type 6  Height:   Ht Readings from Last 1 Encounters:  10/18/17 5\' 11"  (1.803 m)    Weight:   Wt Readings from Last 1  Encounters:  10/21/17 185 lb (83.9 kg)    Ideal Body Weight:  78.1 kg  BMI:  Body mass index is 25.8 kg/m.  Estimated Nutritional Needs:   Kcal:  1900-2200kcal/day   Protein:  95-100g/day   Fluid:  >1.9L/day   Betsey Holidayasey Brannon Levene MS, RD, LDN Pager #- [phone] Office#- [phone] After Hours Pager: [phone]

## 2017-10-23 NOTE — Progress Notes (Signed)
Pt refused bed alarm. Educated on importance of bed alarm and safety. Pt verbalizes understanding, but still would like to have the alarm turned off. The patient is alert and oriented at this time and verbalizes understanding of calling for help if he needs to get up.

## 2017-10-23 NOTE — Progress Notes (Signed)
Pt ambulated 2 laps around unit with no assistance. Tolerated well.

## 2017-10-23 NOTE — Clinical Social Work Note (Signed)
CSW spoke to patient and discussed substance abuse treatment resources.  Patient stated he is [place] [facility] [place], he has contacted them and is supposed to call them once he discharges from hospital.  CSW provided a list of other substance abuse facilities as well.  Patient expressed he feels like he is ready to begin getting better, patient expressed he knows it is going to be a challenge, but he feels like he is ready for it.  CSW also provided patient the contact number for Medicaid Transportation services in order to get a ride home.  Patient was appreciative of information given.  CSW signing off, please reconsult if other social work changes arise.  Ervin KnackEric R. Marybell Robards, MSW, Theresia MajorsLCSWA [phone]  10/23/2017 2:26 PM

## 2017-10-23 NOTE — Discharge Summary (Signed)
[facility] -  at [facility]   PATIENT NAME: Nilda SimmerJohn Colgan    MR#:  [id number]  DATE OF BIRTH:  17-May-1960  DATE OF ADMISSION:  10/21/2017   ADMITTING PHYSICIAN: Auburn BilberryShreyang Patel, MD  DATE OF DISCHARGE:  10/23/17  PRIMARY CARE PHYSICIAN: Barbette ReichmannHande, Vishwanath, MD   ADMISSION DIAGNOSIS:   Alcohol withdrawal syndrome without complication (HCC) [F10.230]  DISCHARGE DIAGNOSIS:   Active Problems:   Alcohol withdrawal (HCC)   SECONDARY DIAGNOSIS:   Past Medical History:  Diagnosis Date  . A-fib (HCC)   . Alcohol abuse   . Alcohol withdrawal (HCC) 11/12/2016  . Diabetes mellitus without complication (HCC)   . DJD (degenerative joint disease)   . Hypertension   . Myasthenia gravis (HCC)   . Renal disorder     HOSPITAL COURSE:   57 year old male with known history of atrial fibrillation not on any anticoagulation, alcohol abuse, diabetes, hypertension and myasthenia presents to hospital secondary to worsening tremors and withdrawal symptoms.  1.  A. fib with RVR-noted to be in atrial fibrillation with rapid ventricular response-initially on Cardizem drip -Now converted to normal sinus rhythm.  started oral Cardizem  -Also on oral amiodarone and metoprolol -Not a candidate for anticoagulation with his history of alcohol abuse  2.  Alcohol abuse with withdrawal symptoms- was on CIWA protocol -Continue supplements with thiamine and multivitamins -In the process of getting to an inpatient rehab. -Much improved hallucinations and mental status.  Still has some tremors of his extremities which is stable.  Discharged on 3 more days of Librium  3.  Hypokalemia-replaced  4.  Tobacco use disorder-counseled on admission and was started on nicotine patch  5.  Neuropathy-gabapentin  6.  History of myasthenia gravis-continue CellCept.  No weakness noted.   Ambulated around the nurses station well.  Will be discharged today   DISCHARGE CONDITIONS:    Guarded  CONSULTS OBTAINED:   None  DRUG ALLERGIES:   Allergies  Allergen Reactions  . Nsaids Other (See Comments)    Duodenal ulcers, GI bleeding  . Other Other (See Comments)    Patient states he can't take any "mycin" medications because myasthenia gravis.   DISCHARGE MEDICATIONS:   Allergies as of 10/23/2017      Reactions   Nsaids Other (See Comments)   Duodenal ulcers, GI bleeding   Other Other (See Comments)   Patient states he can't take any "mycin" medications because myasthenia gravis.      Medication List    STOP taking these medications   nicotine 21 mg/24hr patch Commonly known as:  NICODERM CQ - dosed in mg/24 hours     TAKE these medications   amiodarone 200 MG tablet Commonly known as:  PACERONE Take 1 tablet (200 mg total) by mouth daily.   aspirin 325 MG EC tablet Take 1 tablet (325 mg total) by mouth daily.   B-complex with vitamin C tablet Take 1 tablet by mouth daily.   chlordiazePOXIDE 10 MG capsule Commonly known as:  LIBRIUM Take 1 capsule (10 mg total) by mouth 3 (three) times daily.   citalopram 20 MG tablet Commonly known as:  CELEXA Take 1 tablet (20 mg total) by mouth daily.   diltiazem 180 MG 24 hr capsule Commonly known as:  CARDIZEM CD Take 1 capsule (180 mg total) by mouth daily.   furosemide 40 MG tablet Commonly known as:  LASIX Take 1 tablet (40 mg total) by mouth daily as needed for fluid (if taking prednisone).  gabapentin 300 MG capsule Commonly known as:  NEURONTIN Take 2 capsules (600 mg total) by mouth 3 (three) times daily.   lidocaine 5 % Commonly known as:  LIDODERM Place 1 patch onto the skin daily. Remove & Discard patch within 12 hours or as directed by MD   metFORMIN 500 MG tablet Commonly known as:  GLUCOPHAGE Take 1 tablet (500 mg total) by mouth daily.   metoprolol succinate 50 MG 24 hr tablet Commonly known as:  TOPROL-XL Take 1 tablet (50 mg total) by mouth daily. Take with or  immediately following a meal.   multivitamin Tabs tablet Take 1 tablet by mouth daily.   mycophenolate 500 MG tablet Commonly known as:  CELLCEPT Take 1 tablet (500 mg total) by mouth 2 (two) times daily. Take 1 tablet (500MG ) by mouth every morning and 2 tablets (1000MG ) by mouth every evening   potassium chloride 10 MEQ tablet Commonly known as:  K-DUR Take 1 tablet (10 mEq total) by mouth daily.   protein supplement shake Liqd Commonly known as:  PREMIER PROTEIN Take 325 mLs (11 oz total) by mouth 2 (two) times daily between meals.        DISCHARGE INSTRUCTIONS:   1. F/u with alcohol rehab program 2. PCP f/u in 1 week  DIET:   Cardiac diet  ACTIVITY:   Activity as tolerated  OXYGEN:   Home Oxygen: No.  Oxygen Delivery: room air  DISCHARGE LOCATION:   home   If you experience worsening of your admission symptoms, develop shortness of breath, life threatening emergency, suicidal or homicidal thoughts you must seek medical attention immediately by calling 911 or calling your MD immediately  if symptoms less severe.  You Must read complete instructions/literature along with all the possible adverse reactions/side effects for all the Medicines you take and that have been prescribed to you. Take any new Medicines after you have completely understood and accpet all the possible adverse reactions/side effects.   Please note  You were cared for by a hospitalist during your hospital stay. If you have any questions about your discharge medications or the care you received while you were in the hospital after you are discharged, you can call the unit and asked to speak with the hospitalist on call if the hospitalist that took care of you is not available. Once you are discharged, your primary care physician will handle any further medical issues. Please note that NO REFILLS for any discharge medications will be authorized once you are discharged, as it is imperative that you  return to your primary care physician (or establish a relationship with a primary care physician if you do not have one) for your aftercare needs so that they can reassess your need for medications and monitor your lab values.    On the day of Discharge:  VITAL SIGNS:   Blood pressure 115/74, pulse 69, temperature 98.6 F (37 C), temperature source Oral, resp. rate 14, weight 83.9 kg (185 lb), SpO2 96 %.  PHYSICAL EXAMINATION:    GENERAL:  57 y.o.-year-old patient lying in the bed with no acute distress. Falling asleep easily. EYES: Pupils equal, round, reactive to light and accommodation. No scleral icterus. Extraocular muscles intact.  HEENT: Head atraumatic, normocephalic. Oropharynx and nasopharynx clear.  NECK:  Supple, no jugular venous distention. No thyroid enlargement, no tenderness.  LUNGS: Normal breath sounds bilaterally, no wheezing, rales,rhonchi or crepitation. No use of accessory muscles of respiration. Decreased bibasilar breath sounds CARDIOVASCULAR: S1, S2 normal. No  murmurs, rubs, or gallops.  ABDOMEN: Soft, nontender, nondistended. Bowel sounds present. No organomegaly or mass.  EXTREMITIES: No pedal edema, cyanosis, or clubbing. Tremors of hands noted. NEUROLOGIC: Cranial nerves II through XII are intact. Muscle strength 5/5 in all extremities. Sensation intact. Gait not checked.  PSYCHIATRIC: The patient is alert and oriented x 3,  SKIN: No obvious rash, lesion, or ulcer.    DATA REVIEW:   CBC Recent Labs  Lab 10/22/17 0425  WBC 4.7  HGB 9.9*  HCT 31.3*  PLT 228    Chemistries  Recent Labs  Lab 10/21/17 1347  10/23/17 0425  NA 135   < > 137  K 3.8   < > 4.2  CL 99*   < > 106  CO2 25   < > 23  GLUCOSE 105*   < > 104*  BUN 13   < > 17  CREATININE 0.72   < > 0.75  CALCIUM 8.7*   < > 8.8*  MG  --   --  1.8  AST 29  --   --   ALT 20  --   --   ALKPHOS 87  --   --   BILITOT 0.8  --   --    < > = values in this interval not displayed.      Microbiology Results  Results for orders placed or performed during the hospital encounter of 08/13/17  C difficile quick scan w PCR reflex     Status: None   Collection Time: 08/14/17  5:41 PM  Result Value Ref Range Status   C Diff antigen NEGATIVE NEGATIVE Final   C Diff toxin NEGATIVE NEGATIVE Final   C Diff interpretation No C. difficile detected.  Final    Comment: VALID Performed at [facility], [address] [place], [place] [address]     RADIOLOGY:  No results found.   Management plans discussed with the patient, family and they are in agreement.  CODE STATUS:     Code Status Orders  (From admission, onward)        Start     Ordered   10/21/17 1945  Full code  Continuous     10/21/17 1945    Code Status History    Date Active Date Inactive Code Status Order ID Comments User Context   10/21/2017 1504 10/21/2017 1945 Full Code [id number]  Arnaldo Natal, MD ED   10/10/2017 1314 10/11/2017 1147 Full Code [id number]  Willy Eddy, MD ED   10/05/2017 2214 10/07/2017 2101 Full Code [id number]  Arnaldo Natal, MD ED   10/05/2017 1924 10/05/2017 2214 Full Code [id number]  Arnaldo Natal, MD ED   10/01/2017 0807 08/23/202019 1650 Full Code [id number]  Barbaraann Rondo, MD Inpatient   08/24/2017 1756 08/27/2017 1849 Full Code [id number]  Enid Baas, MD ED   08/14/2017 0030 08/18/2017 1605 Full Code [id number]  Oralia Manis, MD Inpatient   06/04/2017 0116 06/06/2017 1701 Full Code [id number]  Oralia Manis, MD Inpatient   03/19/2017 1559 03/20/2017 2128 Full Code [id number]  Arnaldo Natal, MD ED   02/25/2017 1421 03/01/2017 1829 Full Code [id number]  Houston Siren, MD Inpatient   11/12/2016 1533 11/14/2016 1425 Full Code [id number]  Marguarite Arbour, MD Inpatient   12/26/2015 1927 12/30/2015 1604 Full Code [id number]  Marguarite Arbour, MD Inpatient      TOTAL TIME TAKING CARE OF THIS PATIENT: 38 minutes.    Enid Baas M.D on 10/23/2017 at 2:01  PM  Between 7am to 6pm - Pager - [phone]  After 6pm go to [email or web address] - Social research officer, government  [facility]  Office  [phone]  CC: Primary care physician; [name], MD   Note: This dictation was prepared with Dragon dictation along with smaller phrase technology. Any transcriptional errors that result from this process are unintentional.

## 2017-10-27 ENCOUNTER — Inpatient Hospital Stay
Admission: EM | Admit: 2017-10-27 | Discharge: 2017-11-02 | DRG: 897 | Disposition: A | Discharge status: Home / Self Care | Payer: Medicaid Other | Attending: Internal Medicine | Admitting: Internal Medicine

## 2017-10-27 ENCOUNTER — Emergency Department: Payer: Medicaid Other

## 2017-10-27 ENCOUNTER — Other Ambulatory Visit: Payer: Self-pay

## 2017-10-27 DIAGNOSIS — Z89422 Acquired absence of other left toe(s): Secondary | ICD-10-CM

## 2017-10-27 DIAGNOSIS — Z7982 Long term (current) use of aspirin: Secondary | ICD-10-CM | POA: Diagnosis not present

## 2017-10-27 DIAGNOSIS — G7000 Myasthenia gravis without (acute) exacerbation: Secondary | ICD-10-CM | POA: Diagnosis present

## 2017-10-27 DIAGNOSIS — I429 Cardiomyopathy, unspecified: Secondary | ICD-10-CM | POA: Diagnosis present

## 2017-10-27 DIAGNOSIS — Z79899 Other long term (current) drug therapy: Secondary | ICD-10-CM | POA: Diagnosis not present

## 2017-10-27 DIAGNOSIS — Y908 Blood alcohol level of 240 mg/100 ml or more: Secondary | ICD-10-CM | POA: Diagnosis present

## 2017-10-27 DIAGNOSIS — E119 Type 2 diabetes mellitus without complications: Secondary | ICD-10-CM | POA: Diagnosis present

## 2017-10-27 DIAGNOSIS — I480 Paroxysmal atrial fibrillation: Secondary | ICD-10-CM | POA: Diagnosis present

## 2017-10-27 DIAGNOSIS — F10239 Alcohol dependence with withdrawal, unspecified: Secondary | ICD-10-CM | POA: Diagnosis present

## 2017-10-27 DIAGNOSIS — M48061 Spinal stenosis, lumbar region without neurogenic claudication: Secondary | ICD-10-CM | POA: Diagnosis present

## 2017-10-27 DIAGNOSIS — M549 Dorsalgia, unspecified: Secondary | ICD-10-CM | POA: Diagnosis present

## 2017-10-27 DIAGNOSIS — Z886 Allergy status to analgesic agent status: Secondary | ICD-10-CM | POA: Diagnosis not present

## 2017-10-27 DIAGNOSIS — F10930 Alcohol use, unspecified with withdrawal, uncomplicated: Secondary | ICD-10-CM

## 2017-10-27 DIAGNOSIS — I10 Essential (primary) hypertension: Secondary | ICD-10-CM | POA: Diagnosis present

## 2017-10-27 DIAGNOSIS — F10230 Alcohol dependence with withdrawal, uncomplicated: Secondary | ICD-10-CM

## 2017-10-27 DIAGNOSIS — F10220 Alcohol dependence with intoxication, uncomplicated: Principal | ICD-10-CM | POA: Diagnosis present

## 2017-10-27 DIAGNOSIS — F17210 Nicotine dependence, cigarettes, uncomplicated: Secondary | ICD-10-CM | POA: Diagnosis present

## 2017-10-27 DIAGNOSIS — F1010 Alcohol abuse, uncomplicated: Secondary | ICD-10-CM

## 2017-10-27 DIAGNOSIS — F10939 Alcohol use, unspecified with withdrawal, unspecified: Secondary | ICD-10-CM | POA: Diagnosis present

## 2017-10-27 LAB — COMPREHENSIVE METABOLIC PANEL
ALT: 16 U/L — ABNORMAL LOW (ref 17–63)
AST: 23 U/L (ref 15–41)
Albumin: 3.7 g/dL (ref 3.5–5.0)
Alkaline Phosphatase: 71 U/L (ref 38–126)
Anion gap: 13 (ref 5–15)
BUN: 6 mg/dL (ref 6–20)
CO2: 25 mmol/L (ref 22–32)
Calcium: 8.3 mg/dL — ABNORMAL LOW (ref 8.9–10.3)
Chloride: 105 mmol/L (ref 101–111)
Creatinine, Ser: 0.77 mg/dL (ref 0.61–1.24)
Glucose, Bld: 148 mg/dL — ABNORMAL HIGH (ref 65–99)
POTASSIUM: 3.2 mmol/L — AB (ref 3.5–5.1)
Sodium: 143 mmol/L (ref 135–145)
TOTAL PROTEIN: 6.7 g/dL (ref 6.5–8.1)
Total Bilirubin: 0.5 mg/dL (ref 0.3–1.2)

## 2017-10-27 LAB — CBC WITH DIFFERENTIAL/PLATELET
BASOS ABS: 0.1 10*3/uL (ref 0–0.1)
Basophils Relative: 3 %
EOS PCT: 1 %
Eosinophils Absolute: 0 10*3/uL (ref 0–0.7)
HCT: 36.8 % — ABNORMAL LOW (ref 40.0–52.0)
Hemoglobin: 12 g/dL — ABNORMAL LOW (ref 13.0–18.0)
LYMPHS ABS: 2.3 10*3/uL (ref 1.0–3.6)
LYMPHS PCT: 49 %
MCH: 26 pg (ref 26.0–34.0)
MCHC: 32.5 g/dL (ref 32.0–36.0)
MCV: 80 fL (ref 80.0–100.0)
MONO ABS: 0.3 10*3/uL (ref 0.2–1.0)
MONOS PCT: 7 %
NEUTROS ABS: 1.9 10*3/uL (ref 1.4–6.5)
Neutrophils Relative %: 40 %
Platelets: 237 10*3/uL (ref 150–440)
RBC: 4.6 MIL/uL (ref 4.40–5.90)
RDW: 23.7 % — AB (ref 11.5–14.5)
WBC: 4.7 10*3/uL (ref 3.8–10.6)

## 2017-10-27 LAB — HEMOGLOBIN A1C
HEMOGLOBIN A1C: 5.9 % — AB (ref 4.8–5.6)
MEAN PLASMA GLUCOSE: 122.63 mg/dL

## 2017-10-27 LAB — GLUCOSE, CAPILLARY
GLUCOSE-CAPILLARY: 107 mg/dL — AB (ref 65–99)
Glucose-Capillary: 97 mg/dL (ref 65–99)

## 2017-10-27 LAB — ETHANOL: ALCOHOL ETHYL (B): 458 mg/dL — AB (ref <10)

## 2017-10-27 LAB — LIPASE, BLOOD: LIPASE: 35 U/L (ref 11–51)

## 2017-10-27 MED ORDER — THIAMINE HCL 100 MG/ML IJ SOLN
Freq: Once | INTRAVENOUS | Status: AC
  Administered 2017-10-27: 04:00:00 via INTRAVENOUS
  Filled 2017-10-27: qty 1000

## 2017-10-27 MED ORDER — ENOXAPARIN SODIUM 40 MG/0.4ML ~~LOC~~ SOLN
40.0000 mg | SUBCUTANEOUS | Status: DC
  Administered 2017-10-28 – 2017-11-01 (×5): 40 mg via SUBCUTANEOUS
  Filled 2017-10-27 (×6): qty 0.4

## 2017-10-27 MED ORDER — LORAZEPAM 2 MG/ML IJ SOLN
1.0000 mg | Freq: Four times a day (QID) | INTRAMUSCULAR | Status: DC | PRN
  Administered 2017-10-27 – 2017-10-29 (×5): 1 mg via INTRAVENOUS
  Filled 2017-10-27 (×6): qty 1

## 2017-10-27 MED ORDER — MYCOPHENOLATE MOFETIL 250 MG PO CAPS
1000.0000 mg | ORAL_CAPSULE | Freq: Every day | ORAL | Status: DC
  Administered 2017-10-27 – 2017-11-01 (×6): 1000 mg via ORAL
  Filled 2017-10-27 (×7): qty 4

## 2017-10-27 MED ORDER — ACETAMINOPHEN 325 MG PO TABS
650.0000 mg | ORAL_TABLET | Freq: Four times a day (QID) | ORAL | Status: DC | PRN
  Administered 2017-10-28: 650 mg via ORAL
  Filled 2017-10-27: qty 2

## 2017-10-27 MED ORDER — LORAZEPAM 2 MG/ML IJ SOLN
0.0000 mg | Freq: Four times a day (QID) | INTRAMUSCULAR | Status: AC
  Administered 2017-10-27 – 2017-10-28 (×5): 1 mg via INTRAVENOUS
  Administered 2017-10-28: 06:00:00 2 mg via INTRAVENOUS
  Administered 2017-10-28 (×2): 1 mg via INTRAVENOUS
  Filled 2017-10-27 (×7): qty 1

## 2017-10-27 MED ORDER — LORAZEPAM 1 MG PO TABS: 1.0000 mg | ORAL_TABLET | Freq: Four times a day (QID) | ORAL | Status: DC | PRN

## 2017-10-27 MED ORDER — GABAPENTIN 300 MG PO CAPS
600.0000 mg | ORAL_CAPSULE | Freq: Three times a day (TID) | ORAL | Status: DC
  Administered 2017-10-27 – 2017-11-02 (×18): 600 mg via ORAL
  Filled 2017-10-27 (×2): qty 2
  Filled 2017-10-27: qty 6
  Filled 2017-10-27 (×6): qty 2
  Filled 2017-10-27: qty 6
  Filled 2017-10-27 (×8): qty 2

## 2017-10-27 MED ORDER — ADULT MULTIVITAMIN W/MINERALS CH
1.0000 | ORAL_TABLET | Freq: Every day | ORAL | Status: DC
  Administered 2017-10-27 – 2017-11-02 (×7): 1 via ORAL
  Filled 2017-10-27 (×7): qty 1

## 2017-10-27 MED ORDER — HYDROCODONE-ACETAMINOPHEN 5-325 MG PO TABS
1.0000 | ORAL_TABLET | Freq: Once | ORAL | Status: AC
  Administered 2017-10-27: 1 via ORAL
  Filled 2017-10-27: qty 1

## 2017-10-27 MED ORDER — POTASSIUM CHLORIDE CRYS ER 20 MEQ PO TBCR
40.0000 meq | EXTENDED_RELEASE_TABLET | ORAL | Status: AC
  Administered 2017-10-27 (×2): 40 meq via ORAL
  Filled 2017-10-27 (×2): qty 2

## 2017-10-27 MED ORDER — FOLIC ACID 1 MG PO TABS
1.0000 mg | ORAL_TABLET | Freq: Every day | ORAL | Status: DC
  Administered 2017-10-27 – 2017-11-02 (×7): 1 mg via ORAL
  Filled 2017-10-27 (×7): qty 1

## 2017-10-27 MED ORDER — INSULIN ASPART 100 UNIT/ML ~~LOC~~ SOLN
0.0000 [IU] | Freq: Three times a day (TID) | SUBCUTANEOUS | Status: DC
  Administered 2017-10-29: 1 [IU] via SUBCUTANEOUS
  Administered 2017-10-30 – 2017-10-31 (×2): 3 [IU] via SUBCUTANEOUS
  Filled 2017-10-27 (×3): qty 1

## 2017-10-27 MED ORDER — THIAMINE HCL 100 MG/ML IJ SOLN
100.0000 mg | Freq: Every day | INTRAMUSCULAR | Status: DC
  Administered 2017-10-27 – 2017-10-28 (×2): 100 mg via INTRAVENOUS
  Filled 2017-10-27: qty 1
  Filled 2017-10-27: qty 2

## 2017-10-27 MED ORDER — ASPIRIN EC 325 MG PO TBEC
325.0000 mg | DELAYED_RELEASE_TABLET | Freq: Every day | ORAL | Status: DC
  Administered 2017-10-28 – 2017-11-02 (×6): 325 mg via ORAL
  Filled 2017-10-27 (×6): qty 1

## 2017-10-27 MED ORDER — NICOTINE 14 MG/24HR TD PT24
14.0000 mg | MEDICATED_PATCH | Freq: Every day | TRANSDERMAL | Status: DC
  Administered 2017-10-27 – 2017-11-02 (×7): 14 mg via TRANSDERMAL
  Filled 2017-10-27 (×7): qty 1

## 2017-10-27 MED ORDER — MYCOPHENOLATE MOFETIL 250 MG PO CAPS
500.0000 mg | ORAL_CAPSULE | Freq: Every day | ORAL | Status: DC
  Administered 2017-10-28 – 2017-11-02 (×6): 500 mg via ORAL
  Filled 2017-10-27 (×6): qty 2

## 2017-10-27 MED ORDER — SODIUM CHLORIDE 0.9 % IV BOLUS
1000.0000 mL | Freq: Once | INTRAVENOUS | Status: AC
  Administered 2017-10-27: 1000 mL via INTRAVENOUS

## 2017-10-27 MED ORDER — CHLORDIAZEPOXIDE HCL 25 MG PO CAPS
25.0000 mg | ORAL_CAPSULE | Freq: Three times a day (TID) | ORAL | Status: AC
  Administered 2017-10-27 – 2017-10-29 (×6): 25 mg via ORAL
  Filled 2017-10-27 (×6): qty 1

## 2017-10-27 MED ORDER — AMIODARONE HCL 200 MG PO TABS
200.0000 mg | ORAL_TABLET | Freq: Every day | ORAL | Status: DC
  Administered 2017-10-27 – 2017-11-02 (×7): 200 mg via ORAL
  Filled 2017-10-27 (×7): qty 1

## 2017-10-27 MED ORDER — ONDANSETRON HCL 4 MG/2ML IJ SOLN: 4.0000 mg | Freq: Four times a day (QID) | INTRAMUSCULAR | Status: DC | PRN

## 2017-10-27 MED ORDER — ONDANSETRON HCL 4 MG PO TABS: 4.0000 mg | ORAL_TABLET | Freq: Four times a day (QID) | ORAL | Status: DC | PRN

## 2017-10-27 MED ORDER — LORAZEPAM 2 MG/ML IJ SOLN
2.0000 mg | Freq: Once | INTRAMUSCULAR | Status: AC
  Administered 2017-10-27: 23:00:00 2 mg via INTRAVENOUS
  Filled 2017-10-27: qty 1

## 2017-10-27 MED ORDER — METOPROLOL SUCCINATE ER 50 MG PO TB24
50.0000 mg | ORAL_TABLET | Freq: Every day | ORAL | Status: DC
  Administered 2017-10-27 – 2017-11-02 (×7): 50 mg via ORAL
  Filled 2017-10-27 (×7): qty 1

## 2017-10-27 MED ORDER — CITALOPRAM HYDROBROMIDE 20 MG PO TABS
20.0000 mg | ORAL_TABLET | Freq: Every day | ORAL | Status: DC
  Administered 2017-10-27 – 2017-11-02 (×7): 20 mg via ORAL
  Filled 2017-10-27 (×7): qty 1

## 2017-10-27 MED ORDER — POTASSIUM CHLORIDE IN NACL 20-0.9 MEQ/L-% IV SOLN
75.0000 mL/h | INTRAVENOUS | Status: DC
  Administered 2017-10-27 – 2017-10-28 (×2): 75 mL/h via INTRAVENOUS
  Filled 2017-10-27 (×3): qty 1000

## 2017-10-27 MED ORDER — LABETALOL HCL 5 MG/ML IV SOLN: 10.0000 mg | INTRAVENOUS | Status: DC | PRN

## 2017-10-27 MED ORDER — LORAZEPAM 2 MG/ML IJ SOLN
1.0000 mg | Freq: Once | INTRAMUSCULAR | Status: AC
  Administered 2017-10-27: 1 mg via INTRAVENOUS
  Filled 2017-10-27: qty 1

## 2017-10-27 MED ORDER — IBUPROFEN 400 MG PO TABS
400.0000 mg | ORAL_TABLET | Freq: Four times a day (QID) | ORAL | Status: DC | PRN
  Administered 2017-10-28: 400 mg via ORAL
  Filled 2017-10-27: qty 1

## 2017-10-27 MED ORDER — POLYETHYLENE GLYCOL 3350 17 G PO PACK
17.0000 g | PACK | Freq: Every day | ORAL | Status: DC | PRN
  Administered 2017-11-01: 08:00:00 17 g via ORAL
  Filled 2017-10-27: qty 1

## 2017-10-27 MED ORDER — VITAMIN B-1 100 MG PO TABS
100.0000 mg | ORAL_TABLET | Freq: Every day | ORAL | Status: DC
  Administered 2017-10-27 – 2017-10-28 (×2): 100 mg via ORAL
  Filled 2017-10-27 (×2): qty 1

## 2017-10-27 MED ORDER — ACETAMINOPHEN 650 MG RE SUPP: 650.0000 mg | Freq: Four times a day (QID) | RECTAL | Status: DC | PRN

## 2017-10-27 MED ORDER — ALBUTEROL SULFATE (2.5 MG/3ML) 0.083% IN NEBU: 2.5000 mg | INHALATION_SOLUTION | RESPIRATORY_TRACT | Status: DC | PRN

## 2017-10-27 MED ORDER — SODIUM CHLORIDE 0.9 % IV BOLUS
500.0000 mL | Freq: Once | INTRAVENOUS | Status: AC
  Administered 2017-10-27: 500 mL via INTRAVENOUS

## 2017-10-27 MED ORDER — INSULIN ASPART 100 UNIT/ML ~~LOC~~ SOLN
0.0000 [IU] | Freq: Every day | SUBCUTANEOUS | Status: DC
  Administered 2017-10-30 – 2017-10-31 (×2): 2 [IU] via SUBCUTANEOUS
  Filled 2017-10-27 (×2): qty 1

## 2017-10-27 MED ORDER — DILTIAZEM HCL ER COATED BEADS 180 MG PO CP24
180.0000 mg | ORAL_CAPSULE | Freq: Every day | ORAL | Status: DC
  Administered 2017-10-27 – 2017-11-02 (×7): 180 mg via ORAL
  Filled 2017-10-27 (×9): qty 1

## 2017-10-27 MED ORDER — TRAMADOL HCL 50 MG PO TABS
50.0000 mg | ORAL_TABLET | Freq: Four times a day (QID) | ORAL | Status: DC | PRN
  Administered 2017-10-27 – 2017-10-29 (×3): 50 mg via ORAL
  Filled 2017-10-27 (×3): qty 1

## 2017-10-27 MED ORDER — THIAMINE HCL 100 MG/ML IJ SOLN: 100.0000 mg | Freq: Every day | INTRAMUSCULAR | Status: DC

## 2017-10-27 MED ORDER — POTASSIUM CHLORIDE ER 10 MEQ PO TBCR
10.0000 meq | EXTENDED_RELEASE_TABLET | Freq: Every day | ORAL | Status: DC
  Administered 2017-10-28: 10:00:00 10 meq via ORAL
  Filled 2017-10-27 (×2): qty 1

## 2017-10-27 NOTE — BH Assessment (Signed)
This Clinical research associatewriter followed up with Jillian @ [facility] and she stated patient needs a higher level of care than what they can provide.

## 2017-10-27 NOTE — ED Notes (Signed)
Pt requests pain medication for shoulder pain. MD made aware

## 2017-10-27 NOTE — ED Notes (Signed)
Pt given ginger ale.

## 2017-10-27 NOTE — ED Triage Notes (Signed)
Per EMS, pt from home with reports of drinking "10 alcoholic beverages." EMS states pt is arousable to sternal rub. Pt was placed on 2L of oxygen by EMS due to pt's saturation dropping to 88% on RA. Pt arrives sleeping however is awoken by sternal rub, when pt asks why he is in ED pt states "alcoholic." Pt able to answer questions, states he drank "a lot of liquor" tonight. Pt 97% on RA at this time.

## 2017-10-27 NOTE — ED Notes (Signed)
MD made aware of pt's CIWA score and presentation. See MAR for administered medications

## 2017-10-27 NOTE — Progress Notes (Signed)
Patient exhibiting severe withdrawal symptoms, CIWA score 20. Hospitalist contacted. See new orders for one time dose.

## 2017-10-27 NOTE — BH Assessment (Addendum)
This Clinical research associatewriter confirmed with Jillian @ [facility] referral was  received and is under review.

## 2017-10-27 NOTE — ED Notes (Signed)
ED Provider at bedside. 

## 2017-10-27 NOTE — ED Notes (Signed)
Father called checking on pt status. Informed pt is sleeping at this time. He stated he would call back later.

## 2017-10-27 NOTE — BH Assessment (Signed)
This Clinical research associatewriter faxed referral for alcohol detox @ [phone], spoke to [place]illian at [facility], she stated beds are available.

## 2017-10-27 NOTE — ED Notes (Signed)
Pt requesting something "for the shakes." EDP notified, see MAR for follow up.

## 2017-10-27 NOTE — H&P (Signed)
[employer] - [place] at [facility]   PATIENT NAME: Kurt Horton    MR#:  [id number]  DATE OF BIRTH:  [birth year]  DATE OF ADMISSION:  10/27/2017  PRIMARY CARE PHYSICIAN: Barbette ReichmannHande, Vishwanath, MD   REQUESTING/REFERRING PHYSICIAN: Dr. Cyril LoosenKinner  CHIEF COMPLAINT:   Chief Complaint  Patient presents with  . Alcohol Problem    HISTORY OF PRESENT ILLNESS:  Kurt Horton  is a 57 y.o. male with a known history of attention, paroxysmal atrial fibrillation, cardiomyopathy with ejection fraction for depression, alcohol abuse presents to the emergency room intoxicated asking for alcohol detox. Patient was supposed to be discharged to [facility]. But his withdrawal seem more severe than what can be admitted to [facility]. Patient will be admitted to the hospital for further treatment. Patient was recently in the hospital and discharged on 10/23/2017.  PAST MEDICAL HISTORY:   Past Medical History:  Diagnosis Date  . A-fib (HCC)   . Alcohol abuse   . Alcohol withdrawal (HCC) 11/12/2016  . Diabetes mellitus without complication (HCC)   . DJD (degenerative joint disease)   . Hypertension   . Myasthenia gravis (HCC)   . Renal disorder     PAST SURGICAL HISTORY:   Past Surgical History:  Procedure Laterality Date  . AMPUTATION TOE Left 12/29/2015   Procedure: AMPUTATION TOE;  Surgeon: Gwyneth RevelsJustin Fowler, DPM;  Location: [facility] ORS;  Service: Podiatry;  Laterality: Left;  . PERIPHERAL VASCULAR CATHETERIZATION N/A 12/27/2015   Procedure: Lower Extremity Angiography;  Surgeon: Annice NeedyJason S Dew, MD;  Location: [facility] INVASIVE CV LAB;  Service: Cardiovascular;  Laterality: N/A;  . right hip surgery    . right shoulder surgery      SOCIAL HISTORY:   Social History   Tobacco Use  . Smoking status: Current Every Day Smoker    Packs/day: 0.50    Types: Cigarettes  . Smokeless tobacco: Never Used  Substance Use Topics  . Alcohol use: Yes    Comment: Pt had tequila and rum, at least a 5th or a pint a day     FAMILY HISTORY:   Family History  Problem Relation Age of Onset  . Rheum arthritis Father     DRUG ALLERGIES:   Allergies  Allergen Reactions  . Nsaids Other (See Comments)    Duodenal ulcers, GI bleeding  . Other Other (See Comments)    Patient states he can't take any "mycin" medications because myasthenia gravis.    REVIEW OF SYSTEMS:   Review of Systems  Constitutional: Positive for malaise/fatigue. Negative for chills and fever.  HENT: Negative for sore throat.   Eyes: Negative for blurred vision, double vision and pain.  Respiratory: Negative for cough, hemoptysis, shortness of breath and wheezing.   Cardiovascular: Negative for chest pain, palpitations, orthopnea and leg swelling.  Gastrointestinal: Negative for abdominal pain, constipation, diarrhea, heartburn, nausea and vomiting.  Genitourinary: Negative for dysuria and hematuria.  Musculoskeletal: Positive for back pain. Negative for joint pain.  Skin: Negative for rash.  Neurological: Positive for tremors. Negative for sensory change, speech change, focal weakness and headaches.  Endo/Heme/Allergies: Does not bruise/bleed easily.  Psychiatric/Behavioral: Positive for hallucinations. Negative for depression. The patient is nervous/anxious.     MEDICATIONS AT HOME:   Prior to Admission medications   Medication Sig Start Date End Date Taking? Authorizing Provider  amiodarone (PACERONE) 200 MG tablet Take 1 tablet (200 mg total) by mouth daily. 10/04/17  Yes Adrian SaranMody, Sital, MD  aspirin EC 325 MG EC tablet Take 1  tablet (325 mg total) by mouth daily. 08/18/17  Yes Auburn Bilberry, MD  B Complex-C (B-COMPLEX WITH VITAMIN C) tablet Take 1 tablet by mouth daily.   Yes [provider]  chlordiazePOXIDE (LIBRIUM) 10 MG capsule Take 1 capsule (10 mg total) by mouth 3 (three) times daily. 10/23/17  Yes Enid Baas, MD  citalopram (CELEXA) 20 MG tablet Take 1 tablet (20 mg total) by mouth daily. 10/04/17  Yes  Mody, Patricia Pesa, MD  diltiazem (CARDIZEM CD) 180 MG 24 hr capsule Take 1 capsule (180 mg total) by mouth daily. 10/23/17 02/20/18 Yes Enid Baas, MD  furosemide (LASIX) 40 MG tablet Take 1 tablet (40 mg total) by mouth daily as needed for fluid (if taking prednisone). 10/04/17  Yes Mody, Patricia Pesa, MD  gabapentin (NEURONTIN) 300 MG capsule Take 2 capsules (600 mg total) by mouth 3 (three) times daily. 10/04/17  Yes Mody, Patricia Pesa, MD  metFORMIN (GLUCOPHAGE) 500 MG tablet Take 1 tablet (500 mg total) by mouth daily. 10/04/17  Yes Mody, Patricia Pesa, MD  metoprolol succinate (TOPROL-XL) 50 MG 24 hr tablet Take 1 tablet (50 mg total) by mouth daily. Take with or immediately following a meal. 10/04/17  Yes Mody, Sital, MD  mycophenolate (CELLCEPT) 500 MG tablet Take 1 tablet (500 mg total) by mouth 2 (two) times daily. Take 1 tablet (500MG ) by mouth every morning and 2 tablets (1000MG ) by mouth every evening 10/04/17  Yes Mody, Sital, MD  potassium chloride (K-DUR) 10 MEQ tablet Take 1 tablet (10 mEq total) by mouth daily. 10/04/17 Oct 30, 2018 Yes Mody, Patricia Pesa, MD  protein supplement shake (PREMIER PROTEIN) LIQD Take 325 mLs (11 oz total) by mouth 2 (two) times daily between meals. 10/04/17 11/04/17 Yes Mody, Sital, MD  lidocaine (LIDODERM) 5 % Place 1 patch onto the skin daily. Remove & Discard patch within 12 hours or as directed by MD Patient not taking: Reported on 10/27/2017 10/05/17   Adrian Saran, MD  multivitamin (ONE-A-DAY MEN'S) TABS tablet Take 1 tablet by mouth daily. 08/18/17   Alford Highland, MD     VITAL SIGNS:  Blood pressure (!) 166/98, pulse (!) 102, temperature 97.7 F (36.5 C), temperature source Oral, resp. rate 20, height 5\' 11"  (1.803 m), weight 81.6 kg (180 lb), SpO2 100 %.  PHYSICAL EXAMINATION:  Physical Exam  GENERAL:  57 y.o.-year-old patient lying in the bed with tremors, diaphoresis EYES: Pupils equal, round, reactive to light and accommodation. No scleral icterus. Extraocular muscles intact.   HEENT: Head atraumatic, normocephalic. Oropharynx and nasopharynx clear. No oropharyngeal erythema, moist oral mucosa  NECK:  Supple, no jugular venous distention. No thyroid enlargement, no tenderness.  LUNGS: Normal breath sounds bilaterally, no wheezing, rales, rhonchi. No use of accessory muscles of respiration.  CARDIOVASCULAR: S1, S2 tachycardia ABDOMEN: Soft, nontender, nondistended. Bowel sounds present. No organomegaly or mass.  EXTREMITIES: No pedal edema, cyanosis, or clubbing. + 2 pedal & radial pulses b/l.   NEUROLOGIC: Cranial nerves II through XII are intact. No focal Motor or sensory deficits appreciated b/l PSYCHIATRIC: The patient is alert and oriented x 3. Anxious SKIN: No obvious rash, lesion, or ulcer.   LABORATORY PANEL:   CBC Recent Labs  Lab 10/27/17 0230  WBC 4.7  HGB 12.0*  HCT 36.8*  PLT 237   ------------------------------------------------------------------------------------------------------------------  Chemistries  Recent Labs  Lab 10/23/17 0425 10/27/17 0230  NA 137 143  K 4.2 3.2*  CL 106 105  CO2 23 25  GLUCOSE 104* 148*  BUN 17 6  CREATININE 0.75  0.77  CALCIUM 8.8* 8.3*  MG 1.8  --   AST  --  23  ALT  --  16*  ALKPHOS  --  71  BILITOT  --  0.5   ------------------------------------------------------------------------------------------------------------------  Cardiac Enzymes No results for input(s): TROPONINI in the last 168 hours. ------------------------------------------------------------------------------------------------------------------  RADIOLOGY:  Dg Chest 1 View  Result Date: 10/27/2017 CLINICAL DATA:  Alcohol. Patient is arousable only to sternal rub. Possible aspiration. EXAM: CHEST  1 VIEW COMPARISON:  10/21/2017 FINDINGS: Shallow inspiration. Heart size is increased. Pulmonary vascularity is normal. Lungs appear clear and expanded. No blunting of costophrenic angles. No pneumothorax. Thoracic scoliosis.  IMPRESSION: Cardiac enlargement.  No evidence of active pulmonary disease. Electronically Signed   By: Burman Nieves M.D.   On: 10/27/2017 03:14     IMPRESSION AND PLAN:   *Alcohol withdrawal. Admit patient to medical floor with telemetry monitoring. Alcohol withdrawal protocol ordered. Start schedule Librium. Thiamine, folic acid. Counsel to quit alcohol. STAT dose of Ativan IV  *Hypertension. Restart patient's Cardizem and metoprolol. Also ordered IV labetalol.  *Paroxysmal atrial fibrillation. Check EKG. Start amiodarone, Cardizem and metoprolol. Aspirin.  *Cardiomyopathy with ejection fraction 40%. No signs of fluid overload.  DVT prophylaxis with Lovenox  All the records are reviewed and case discussed with ED provider. Management plans discussed with the patient, family and they are in agreement.  CODE STATUS: FULL CODE  TOTAL CC TIME TAKING CARE OF THIS PATIENT: 40 minutes.   Orie Fisherman M.D on 10/27/2017 at 3:24 PM  Between 7am to 6pm - Pager - [phone]  After 6pm go to [email or web address] - password [email or web address] [facility]  [facility] [place] [facility]  Office  [phone]  CC: Primary care physician; [name], MD  Note: This dictation was prepared with Dragon dictation along with smaller phrase technology. Any transcriptional errors that result from this process are unintentional.

## 2017-10-27 NOTE — ED Notes (Signed)
Pt states he is an "alcoholic." Pt states he called EMS to bring him here for detox. Pt with mult visits for same. Pt denies SI/HI. Pt able to answer questions when woken up.

## 2017-10-27 NOTE — ED Notes (Signed)
Called [facility] who states they are working on pt's plan

## 2017-10-27 NOTE — BH Assessment (Addendum)
This Clinical research associatewriter called [facility] @ [phone] to get an update on referral sent, no answer.

## 2017-10-27 NOTE — BH Assessment (Signed)
Assessment Note  Kurt Horton is an 57 y.o. male. Patient presents to [facility] due to alcohol intoxication. Patient reports drinking "10 alcoholic beverages" and then called  EMS because he wanted alcohol detox. Patient presented to [facility] several times for the same reason and most recently on 10/18/2017. Patient has a BAC of 458. Patient endorses depression. Patient denies SI/HI/AVH.  Patient doesn't currently have any involvement in the legal system.   Diagnosis: Alcohol Use Disorder, Severe  Past Medical History:  Past Medical History:  Diagnosis Date  . A-fib (HCC)   . Alcohol abuse   . Alcohol withdrawal (HCC) 11/12/2016  . Diabetes mellitus without complication (HCC)   . DJD (degenerative joint disease)   . Hypertension   . Myasthenia gravis (HCC)   . Renal disorder     Past Surgical History:  Procedure Laterality Date  . AMPUTATION TOE Left 12/29/2015   Procedure: AMPUTATION TOE;  Surgeon: Gwyneth Revels, DPM;  Location: [facility] ORS;  Service: Podiatry;  Laterality: Left;  . PERIPHERAL VASCULAR CATHETERIZATION N/A 12/27/2015   Procedure: Lower Extremity Angiography;  Surgeon: Annice Needy, MD;  Location: [facility] INVASIVE CV LAB;  Service: Cardiovascular;  Laterality: N/A;  . right hip surgery    . right shoulder surgery      Family History:  Family History  Problem Relation Age of Onset  . Rheum arthritis Father     Social History:  reports that he has been smoking cigarettes.  He has been smoking about 0.50 packs per day. He has never used smokeless tobacco. He reports that he drinks alcohol. He reports that he does not use drugs.  Additional Social History:  Alcohol / Drug Use Pain Medications: SEE PTA  Prescriptions: SEE PTA  Over the Counter: SEE PTA  History of alcohol / drug use?: Yes Longest period of sobriety (when/how long): Unknown Negative Consequences of Use: Financial, Personal relationships Withdrawal Symptoms: Fever / Chills, Nausea / Vomiting,  Tremors Substance #1 Name of Substance 1: Alcohol 1 - Age of First Use: 18 1 - Amount (size/oz): a fifth of liquor 1 - Frequency: daily  1 - Duration: ongoing  1 - Last Use / Amount: 10/27/2017  CIWA: CIWA-Ar BP: 130/83 Pulse Rate: 78 Nausea and Vomiting: no nausea and no vomiting Tactile Disturbances: none Tremor: five Auditory Disturbances: not present Paroxysmal Sweats: no sweat visible Visual Disturbances: not present Anxiety: three Headache, Fullness in Head: none present Agitation: normal activity Orientation and Clouding of Sensorium: oriented and can do serial additions CIWA-Ar Total: 8 COWS:    Allergies:  Allergies  Allergen Reactions  . Nsaids Other (See Comments)    Duodenal ulcers, GI bleeding  . Other Other (See Comments)    Patient states he can't take any "mycin" medications because myasthenia gravis.    Home Medications:  (Not in a hospital admission)  OB/GYN Status:  No LMP for male patient.  General Assessment Data Assessment unable to be completed: (Assessment completed ) Location of Assessment: [facility] ED TTS Assessment: In system Is this a Tele or Face-to-Face Assessment?: Face-to-Face Is this an Initial Assessment or a Re-assessment for this encounter?: Initial Assessment Marital status: Divorced [place] name: N/A Is patient pregnant?: No Pregnancy Status: No Living Arrangements: Other (Comment)(Homeless) Can pt return to current living arrangement?: Yes Admission Status: Voluntary Is patient capable of signing voluntary admission?: Yes Referral Source: Self/Family/Friend Insurance type: Medicaid  Medical Screening Exam [facility] Walk-in ONLY) Medical Exam completed: Yes  Crisis Care Plan Living Arrangements: Other (Comment)(Homeless)  Legal Guardian: Other:(N/A) Name of Psychiatrist: None reported  Name of Therapist: None reported  Education Status Is patient currently in school?: No Is the patient employed, unemployed or receiving  disability?: Receiving disability income, Unemployed  Risk to self with the past 6 months Suicidal Ideation: No Has patient been a risk to self within the past 6 months prior to admission? : No Suicidal Intent: No Has patient had any suicidal intent within the past 6 months prior to admission? : No Is patient at risk for suicide?: No Suicidal Plan?: No Has patient had any suicidal plan within the past 6 months prior to admission? : No Access to Means: No What has been your use of drugs/alcohol within the last 12 months?: Alcohol Previous Attempts/Gestures: No How many times?: 0 Other Self Harm Risks: Active Addiction Triggers for Past Attempts: None known Intentional Self Injurious Behavior: None Family Suicide History: No Recent stressful life event(s): Divorce Persecutory voices/beliefs?: No Depression: Yes Depression Symptoms: Feeling worthless/self pity Substance abuse history and/or treatment for substance abuse?: Yes Suicide prevention information given to non-admitted patients: Not applicable  Risk to Others within the past 6 months Homicidal Ideation: No Does patient have any lifetime risk of violence toward others beyond the six months prior to admission? : No Thoughts of Harm to Others: No Current Homicidal Intent: No Current Homicidal Plan: No Access to Homicidal Means: No Identified Victim: None reported History of harm to others?: No Assessment of Violence: None Noted Violent Behavior Description: None reported Does patient have access to weapons?: No Criminal Charges Pending?: No Does patient have a court date: No Is patient on probation?: No  Psychosis Hallucinations: None noted Delusions: None noted  Mental Status Report Appearance/Hygiene: Unremarkable, In scrubs Eye Contact: Poor Motor Activity: Unremarkable Speech: Soft Level of Consciousness: Alert Mood: Depressed Affect: Anxious, Depressed Anxiety Level: Minimal Thought Processes:  Circumstantial Judgement: Impaired Orientation: Person, Place, Time, Situation, Appropriate for developmental age Obsessive Compulsive Thoughts/Behaviors: None  Cognitive Functioning Concentration: Fair Memory: Recent Intact, Remote Intact Is patient IDD: No Is patient DD?: No I IQ score available?: No Insight: Fair Impulse Control: Poor Appetite: Fair Have you had any weight changes? : No Change Sleep: No Change Total Hours of Sleep: 6 Vegetative Symptoms: None  ADLScreening [facility] Assessment Services) Patient's cognitive ability adequate to safely complete daily activities?: Yes Patient able to express need for assistance with ADLs?: Yes Independently performs ADLs?: Yes (appropriate for developmental age)  Prior Inpatient Therapy Prior Inpatient Therapy: No  Prior Outpatient Therapy Prior Outpatient Therapy: No Does patient have an ACCT team?: No Does patient have Intensive In-House Services?  : No Does patient have [facility] services? : No Does patient have P4CC services?: No  ADL Screening (condition at time of admission) Patient's cognitive ability adequate to safely complete daily activities?: Yes Is the patient deaf or have difficulty hearing?: No Does the patient have difficulty seeing, even when wearing glasses/contacts?: No Does the patient have difficulty concentrating, remembering, or making decisions?: No Patient able to express need for assistance with ADLs?: Yes Does the patient have difficulty dressing or bathing?: No Independently performs ADLs?: Yes (appropriate for developmental age) Does the patient have difficulty walking or climbing stairs?: No Weakness of Legs: None Weakness of Arms/Hands: None  Home Assistive Devices/Equipment Home Assistive Devices/Equipment: None  Therapy Consults (therapy consults require a physician order) PT Evaluation Needed: No OT Evalulation Needed: No Abuse/Neglect Assessment (Assessment to be complete while patient is  alone) Abuse/Neglect Assessment Can Be Completed: Yes  Physical Abuse: Denies Verbal Abuse: Denies Sexual Abuse: Denies Exploitation of patient/patient's resources: Denies Self-Neglect: Denies Values / Beliefs Cultural Requests During Hospitalization: None Spiritual Requests During Hospitalization: None Consults Spiritual Care Consult Needed: No Social Work Consult Needed: No Merchant navy officerAdvance Directives (For Healthcare) Does Patient Have a Medical Advance Directive?: No Would patient like information on creating a medical advance directive?: No - Patient declined          Disposition:  Disposition Initial Assessment Completed for this Encounter: Yes Patient referred to: RTS  On Site Evaluation by:   Reviewed with Physician:    Rhea BleacherFEDORIA L Sofia Vanmeter, LPC, LCAS-A 10/27/2017 10:10 AM

## 2017-10-27 NOTE — ED Provider Notes (Addendum)
[facility] Emergency Department Provider Note   ____________________________________________   First MD Initiated Contact with Patient 10/27/17 [phone]     (approximate)  I have reviewed the triage vital signs and the nursing notes.   HISTORY  Chief Complaint Alcohol Problem  Level V caveat: History limited by intoxication  HPI Kurt Horton is a 57 y.o. male brought to the ED from home via EMS with a chief complaint of alcohol intoxication.  Patient reports drinking "10 alcoholic beverages" and then called EMS because he wanted alcohol detox.  History of same with frequent ED visits for same.  Denies SI/HI/AH/VH.  Patient denies associated abdominal pain, nausea or vomiting.  Denies chest pain or shortness of breath.  Denies recent travel or trauma.   Past Medical History:  Diagnosis Date  . A-fib (HCC)   . Alcohol abuse   . Alcohol withdrawal (HCC) 11/12/2016  . Diabetes mellitus without complication (HCC)   . DJD (degenerative joint disease)   . Hypertension   . Myasthenia gravis (HCC)   . Renal disorder     Patient Active Problem List   Diagnosis Date Noted  . Malnutrition of moderate degree 10/04/2017  . Chronic combined systolic and diastolic CHF (congestive heart failure) (HCC) 08/13/2017  . Alcohol withdrawal delirium (HCC) 02/26/2017  . Atrial fibrillation with RVR (HCC) 02/25/2017  . Acute alcoholic intoxication with complication (HCC)   . Alcohol abuse   . Alcohol withdrawal (HCC) 11/13/2016  . Substance induced mood disorder (HCC) 11/13/2016  . SVT (supraventricular tachycardia) (HCC) 11/12/2016  . HTN (hypertension) 11/12/2016  . EtOH dependence (HCC) 11/12/2016  . Lumbar radiculopathy 08/24/2016  . Depression with anxiety 08/24/2016  . Chronic low back pain 01/04/2016  . Pressure ulcer 12/29/2015  . Osteomyelitis (HCC) 12/26/2015  . PVD (peripheral vascular disease) (HCC) 12/26/2015  . Type II diabetes mellitus with  manifestations (HCC) 12/26/2015  . Myasthenia gravis (HCC) 12/26/2015    Past Surgical History:  Procedure Laterality Date  . AMPUTATION TOE Left 12/29/2015   Procedure: AMPUTATION TOE;  Surgeon: Gwyneth Revels, DPM;  Location: [facility] ORS;  Service: Podiatry;  Laterality: Left;  . PERIPHERAL VASCULAR CATHETERIZATION N/A 12/27/2015   Procedure: Lower Extremity Angiography;  Surgeon: Annice Needy, MD;  Location: [facility] INVASIVE CV LAB;  Service: Cardiovascular;  Laterality: N/A;  . right hip surgery    . right shoulder surgery      Prior to Admission medications   Medication Sig Start Date End Date Taking? Authorizing Provider  amiodarone (PACERONE) 200 MG tablet Take 1 tablet (200 mg total) by mouth daily. 10/04/17   Adrian Saran, MD  aspirin EC 325 MG EC tablet Take 1 tablet (325 mg total) by mouth daily. 08/18/17   Auburn Bilberry, MD  B Complex-C (B-COMPLEX WITH VITAMIN C) tablet Take 1 tablet by mouth daily.    [provider]  chlordiazePOXIDE (LIBRIUM) 10 MG capsule Take 1 capsule (10 mg total) by mouth 3 (three) times daily. 10/23/17   Enid Baas, MD  citalopram (CELEXA) 20 MG tablet Take 1 tablet (20 mg total) by mouth daily. 10/04/17   Adrian Saran, MD  diltiazem (CARDIZEM CD) 180 MG 24 hr capsule Take 1 capsule (180 mg total) by mouth daily. 10/23/17 02/20/18  Enid Baas, MD  furosemide (LASIX) 40 MG tablet Take 1 tablet (40 mg total) by mouth daily as needed for fluid (if taking prednisone). 10/04/17   Adrian Saran, MD  gabapentin (NEURONTIN) 300 MG capsule Take 2 capsules (  600 mg total) by mouth 3 (three) times daily. 10/04/17   Adrian Saran, MD  lidocaine (LIDODERM) 5 % Place 1 patch onto the skin daily. Remove & Discard patch within 12 hours or as directed by MD 10/05/17   Adrian Saran, MD  metFORMIN (GLUCOPHAGE) 500 MG tablet Take 1 tablet (500 mg total) by mouth daily. 10/04/17   Adrian Saran, MD  metoprolol succinate (TOPROL-XL) 50 MG 24 hr tablet Take 1 tablet (50 mg  total) by mouth daily. Take with or immediately following a meal. 10/04/17   Adrian Saran, MD  multivitamin (ONE-A-DAY MEN'S) TABS tablet Take 1 tablet by mouth daily. 08/18/17   Alford Highland, MD  mycophenolate (CELLCEPT) 500 MG tablet Take 1 tablet (500 mg total) by mouth 2 (two) times daily. Take 1 tablet (500MG ) by mouth every morning and 2 tablets (1000MG ) by mouth every evening 10/04/17   Adrian Saran, MD  potassium chloride (K-DUR) 10 MEQ tablet Take 1 tablet (10 mEq total) by mouth daily. 10/04/17 2018/10/12  Adrian Saran, MD  protein supplement shake (PREMIER PROTEIN) LIQD Take 325 mLs (11 oz total) by mouth 2 (two) times daily between meals. 10/04/17 11/04/17  Adrian Saran, MD    Allergies Nsaids and Other  Family History  Problem Relation Age of Onset  . Rheum arthritis Father     Social History Social History   Tobacco Use  . Smoking status: Current Every Day Smoker    Packs/day: 0.50    Types: Cigarettes  . Smokeless tobacco: Never Used  Substance Use Topics  . Alcohol use: Yes    Comment: Pt had tequila and rum, at least a 5th or a pint a day  . Drug use: No    Review of Systems  Constitutional: Positive for alcohol intoxication.  No fever/chills Eyes: No visual changes. ENT: No sore throat. Cardiovascular: Denies chest pain. Respiratory: Denies shortness of breath. Gastrointestinal: No abdominal pain.  No nausea, no vomiting.  No diarrhea.  No constipation. Genitourinary: Negative for dysuria. Musculoskeletal: Negative for back pain. Skin: Negative for rash. Neurological: Negative for headaches, focal weakness or numbness.   ____________________________________________   PHYSICAL EXAM:  VITAL SIGNS: ED Triage Vitals  Enc Vitals Group     BP 10/27/17 0237 129/83     Pulse Rate 10/27/17 0237 83     Resp 10/27/17 0237 14     Temp 10/27/17 0237 97.7 F (36.5 C)     Temp Source 10/27/17 0237 Oral     SpO2 10/27/17 0237 97 %     Weight 10/27/17 0238 180 lb  (81.6 kg)     Height 10/27/17 0238 5\' 11"  (1.803 m)     Head Circumference --      Peak Flow --      Pain Score 10/27/17 0238 0     Pain Loc --      Pain Edu? --      Excl. in GC? --     Constitutional: Alert and oriented.  Disheveled appearing and in no acute distress.  Heavily intoxicated. Eyes: Conjunctivae are normal. PERRL. EOMI. Head: Atraumatic. Nose: No deformity or injury. Mouth/Throat: Mucous membranes are moist.   Neck: No stridor.  No cervical spine tenderness to palpation. Cardiovascular: Normal rate, regular rhythm. Grossly normal heart sounds.  Good peripheral circulation. Respiratory: Normal respiratory effort.  No retractions. Lungs CTAB. Gastrointestinal: Soft and nontender to light or deep palpation. No distention. No abdominal bruits. No CVA tenderness. Musculoskeletal: No lower extremity tenderness nor edema.  No joint effusions. Neurologic:  Normal speech and language. No gross focal neurologic deficits are appreciated.  Skin:  Skin is warm, dry and intact. No rash noted. Psychiatric: Intoxicated.  Mood and affect are normal. Speech and behavior are normal.  ____________________________________________   LABS (all labs ordered are listed, but only abnormal results are displayed)  Labs Reviewed  CBC WITH DIFFERENTIAL/PLATELET - Abnormal; Notable for the following components:      Result Value   Hemoglobin 12.0 (*)    HCT 36.8 (*)    RDW 23.7 (*)    All other components within normal limits  COMPREHENSIVE METABOLIC PANEL - Abnormal; Notable for the following components:   Potassium 3.2 (*)    Glucose, Bld 148 (*)    Calcium 8.3 (*)    ALT 16 (*)    All other components within normal limits  ETHANOL - Abnormal; Notable for the following components:   Alcohol, Ethyl (B) 458 (*)    All other components within normal limits  LIPASE, BLOOD    ____________________________________________  EKG  None ____________________________________________  RADIOLOGY  ED MD interpretation: No acute cardiopulmonary process  Official radiology report(s): Dg Chest 1 View  Result Date: 10/27/2017 CLINICAL DATA:  Alcohol. Patient is arousable only to sternal rub. Possible aspiration. EXAM: CHEST  1 VIEW COMPARISON:  10/21/2017 FINDINGS: Shallow inspiration. Heart size is increased. Pulmonary vascularity is normal. Lungs appear clear and expanded. No blunting of costophrenic angles. No pneumothorax. Thoracic scoliosis. IMPRESSION: Cardiac enlargement.  No evidence of active pulmonary disease. Electronically Signed   By: Burman Nieves M.D.   On: 10/27/2017 03:14    ____________________________________________   PROCEDURES  Procedure(s) performed: None  Procedures  Critical Care performed:   CRITICAL CARE Performed by: Irean Hong   Total critical care time: 30 minutes  Critical care time was exclusive of separately billable procedures and treating other patients.  Critical care was necessary to treat or prevent imminent or life-threatening deterioration.  Critical care was time spent personally by me on the following activities: development of treatment plan with patient and/or surrogate as well as nursing, discussions with consultants, evaluation of patient's response to treatment, examination of patient, obtaining history from patient or surrogate, ordering and performing treatments and interventions, ordering and review of laboratory studies, ordering and review of radiographic studies, pulse oximetry and re-evaluation of patient's condition.  ____________________________________________   INITIAL IMPRESSION / ASSESSMENT AND PLAN / ED COURSE  As part of my medical decision making, I reviewed the following data within the electronic MEDICAL RECORD NUMBER Nursing notes reviewed and incorporated, Labs reviewed, Old chart  reviewed, Radiograph reviewed  and Notes from prior ED visits   57 year old male with alcohol dependency who presents with intoxication.  History of DTs.  Will initiate IV fluid resuscitation, banana bag and placed on CIWA score.  During my interview and examination, patient purposely closes his eyes and begins to shake his upper extremities and asking for Ativan.  Alcohol level is 458.  I do not believe he is withdrawing as his movements seem intentionally purposeful.  Hold Ativan for now.  Clinical Course as of Oct 27 728  Sat Oct 27, 2017  0728 Patient given Ativan per CIWA protocol.  He is now more awake.  Will place consult to TTS for alcohol detox per his request. Care transferred to Dr. Cyril Loosen.   [JS]    Clinical Course User Index [JS] Irean Hong, MD     ____________________________________________   FINAL CLINICAL IMPRESSION(S) /  ED DIAGNOSES  Final diagnoses:  Alcoholic intoxication without complication [facility](HCC)     ED Discharge Orders    None       Note:  This document was prepared using Dragon voice recognition software and may include unintentional dictation errors.    Irean HongSung, Lakeitha Basques J, MD 10/27/17 0730    Irean HongSung, Kelyse Pask J, MD 11/14/17 [phone]

## 2017-10-27 NOTE — ED Notes (Addendum)
Pt placed on 2L of oxygen due to saturation 88-89% on RA, pt sleeping with audible unlabored resp. Pt's O2 98% on 2L. EDP aware.

## 2017-10-27 NOTE — ED Provider Notes (Signed)
RTS will not accept. Patient has had severe DTs in the past will admit to the hospitalist service   Jene EveryKinner, Cletus Mehlhoff, MD 10/27/17 1438

## 2017-10-28 ENCOUNTER — Inpatient Hospital Stay: Payer: Medicaid Other

## 2017-10-28 LAB — COMPREHENSIVE METABOLIC PANEL
ALT: 15 U/L — AB (ref 17–63)
AST: 24 U/L (ref 15–41)
Albumin: 3.9 g/dL (ref 3.5–5.0)
Alkaline Phosphatase: 71 U/L (ref 38–126)
Anion gap: 7 (ref 5–15)
BILIRUBIN TOTAL: 0.9 mg/dL (ref 0.3–1.2)
BUN: 7 mg/dL (ref 6–20)
CO2: 25 mmol/L (ref 22–32)
Calcium: 9.1 mg/dL (ref 8.9–10.3)
Chloride: 104 mmol/L (ref 101–111)
Creatinine, Ser: 0.72 mg/dL (ref 0.61–1.24)
GFR calc Af Amer: >60 mL/min (ref >60)
Glucose, Bld: 166 mg/dL — ABNORMAL HIGH (ref 65–99)
Potassium: 4.1 mmol/L (ref 3.5–5.1)
Sodium: 136 mmol/L (ref 135–145)
TOTAL PROTEIN: 6.7 g/dL (ref 6.5–8.1)

## 2017-10-28 LAB — CBC
HEMATOCRIT: 36 % — AB (ref 40.0–52.0)
Hemoglobin: 11.5 g/dL — ABNORMAL LOW (ref 13.0–18.0)
MCH: 25.7 pg — ABNORMAL LOW (ref 26.0–34.0)
MCHC: 31.9 g/dL — ABNORMAL LOW (ref 32.0–36.0)
MCV: 80.6 fL (ref 80.0–100.0)
Platelets: 212 10*3/uL (ref 150–440)
RBC: 4.47 MIL/uL (ref 4.40–5.90)
RDW: 23.5 % — ABNORMAL HIGH (ref 11.5–14.5)
WBC: 5.3 10*3/uL (ref 3.8–10.6)

## 2017-10-28 LAB — GLUCOSE, CAPILLARY
GLUCOSE-CAPILLARY: 106 mg/dL — AB (ref 65–99)
GLUCOSE-CAPILLARY: 116 mg/dL — AB (ref 65–99)
GLUCOSE-CAPILLARY: 143 mg/dL — AB (ref 65–99)
Glucose-Capillary: 107 mg/dL — ABNORMAL HIGH (ref 65–99)

## 2017-10-28 MED ORDER — THIAMINE HCL 100 MG/ML IJ SOLN
Freq: Every day | INTRAVENOUS | Status: DC
  Administered 2017-10-29 – 2017-10-31 (×3): via INTRAVENOUS
  Filled 2017-10-28 (×3): qty 5

## 2017-10-28 MED ORDER — POTASSIUM CHLORIDE CRYS ER 10 MEQ PO TBCR
10.0000 meq | EXTENDED_RELEASE_TABLET | Freq: Every day | ORAL | Status: DC
  Administered 2017-10-29 – 2017-11-02 (×5): 10 meq via ORAL
  Filled 2017-10-28 (×5): qty 1

## 2017-10-28 MED ORDER — THIAMINE HCL 100 MG/ML IJ SOLN: 500.0000 mg | Freq: Every day | INTRAMUSCULAR | Status: DC

## 2017-10-28 MED ORDER — SODIUM CHLORIDE 0.9 % IV SOLN
INTRAVENOUS | Status: DC
  Administered 2017-10-28 – 2017-10-29 (×2): via INTRAVENOUS

## 2017-10-28 NOTE — Plan of Care (Signed)
  Problem: Education: Goal: Knowledge of General Education information will improve Outcome: Progressing   Problem: Health Behavior/Discharge Planning: Goal: Ability to manage health-related needs will improve Outcome: Progressing   Problem: Clinical Measurements: Goal: Will remain free from infection Outcome: Progressing   

## 2017-10-28 NOTE — Progress Notes (Signed)
Patient ID: Kurt Horton, male   DOB: 01-May-1961, 57 y.o.   MRN: [id number]  [facility] PROGRESS NOTE  Kurt Horton:[id number] DOB: Jun 14, 1960 DOA: 10/27/2017 PCP: Kurt Reichmann, MD  HPI/Subjective: Patient asking for Ativan.  He drinks 1/5  a day of liquor per day.  He states he goes through bed withdrawal and has a tremor.   Objective: Vitals:   10/27/17 2223 10/28/17 0436  BP:  118/69  Pulse: (!) 106 86  Resp:  19  Temp:  98.4 Horton (36.9 C)  SpO2:  98%    Filed Weights   10/27/17 0238 10/28/17 0436  Weight: 81.6 kg (180 lb) 83.3 kg (183 lb 10.3 oz)    ROS: Review of Systems  Constitutional: Negative for chills and fever.  Eyes: Negative for blurred vision.  Respiratory: Negative for cough and shortness of breath.   Cardiovascular: Negative for chest pain.  Gastrointestinal: Negative for abdominal pain, constipation, diarrhea, nausea and vomiting.  Genitourinary: Negative for dysuria.  Musculoskeletal: Negative for joint pain.  Neurological: Positive for tremors. Negative for dizziness and headaches.   Exam: Physical Exam  HENT:  Nose: No mucosal edema.  Mouth/Throat: No oropharyngeal exudate or posterior oropharyngeal edema.  Eyes: Pupils are equal, round, and reactive to light. Conjunctivae, EOM and lids are normal.  Neck: No JVD present. Carotid bruit is not present. No edema present. No thyroid mass and no thyromegaly present.  Cardiovascular: S1 normal and S2 normal. Exam reveals no gallop.  No murmur heard. Pulses:      Dorsalis pedis pulses are 2+ on the right side, and 2+ on the left side.  Respiratory: No respiratory distress. He has no wheezes. He has no rhonchi. He has no rales.  GI: Soft. Bowel sounds are normal. There is no tenderness.  Musculoskeletal:       Right ankle: He exhibits no swelling.       Left ankle: He exhibits no swelling.  Lymphadenopathy:    He has no cervical adenopathy.  Neurological: He is alert. No cranial nerve  deficit.  Slight tremor.  Skin: Skin is warm. No rash noted. Nails show no clubbing.  Psychiatric: He has a normal mood and affect.      Data Reviewed: Basic Metabolic Panel: Recent Labs  Lab 10/22/17 0425 10/23/17 0425 10/27/17 0230 10/28/17 0359  NA 137 137 143 136  K 3.4* 4.2 3.2* 4.1  CL 101 106 105 104  CO2 26 23 25 25   GLUCOSE 110* 104* 148* 166*  BUN 11 17 6 7   CREATININE 0.71 0.75 0.77 0.72  CALCIUM 8.4* 8.8* 8.3* 9.1  MG  --  1.8  --   --   PHOS  --  3.1  --   --    Liver Function Tests: Recent Labs  Lab 10/27/17 0230 10/28/17 0359  AST 23 24  ALT 16* 15*  ALKPHOS 71 71  BILITOT 0.5 0.9  PROT 6.7 6.7  ALBUMIN 3.7 3.9   Recent Labs  Lab 10/27/17 0230  LIPASE 35   CBC: Recent Labs  Lab 10/22/17 0425 10/27/17 0230 10/28/17 0359  WBC 4.7 4.7 5.3  NEUTROABS  --  1.9  --   HGB 9.9* 12.0* 11.5*  HCT 31.3* 36.8* 36.0*  MCV 79.6* 80.0 80.6  PLT 228 237 212   BNP (last 3 results) Recent Labs    08/14/17 0112  BNP 65.0     CBG: Recent Labs  Lab 10/27/17 1721 10/27/17 2123 10/28/17 0754 10/28/17  1159  GLUCAP 97 107* 106* 116*      Studies: Dg Chest 1 View  Result Date: 10/27/2017 CLINICAL DATA:  Alcohol. Patient is arousable only to sternal rub. Possible aspiration. EXAM: CHEST  1 VIEW COMPARISON:  10/21/2017 FINDINGS: Shallow inspiration. Heart size is increased. Pulmonary vascularity is normal. Lungs appear clear and expanded. No blunting of costophrenic angles. No pneumothorax. Thoracic scoliosis. IMPRESSION: Cardiac enlargement.  No evidence of active pulmonary disease. Electronically Signed   By: Burman NievesWilliam  Horton M.D.   On: 10/27/2017 03:14    Scheduled Meds: . amiodarone  200 mg Oral Daily  . aspirin  325 mg Oral Daily  . chlordiazePOXIDE  25 mg Oral TID  . citalopram  20 mg Oral Daily  . diltiazem  180 mg Oral Daily  . enoxaparin (LOVENOX) injection  40 mg Subcutaneous Q24H  . folic acid  1 mg Oral Daily  . gabapentin  600  mg Oral TID  . insulin aspart  0-5 Units Subcutaneous QHS  . insulin aspart  0-9 Units Subcutaneous TID WC  . LORazepam  0-4 mg Intravenous Q6H  . metoprolol succinate  50 mg Oral Daily  . multivitamin with minerals  1 tablet Oral Daily  . mycophenolate  1,000 mg Oral QHS  . mycophenolate  500 mg Oral Q0600  . nicotine  14 mg Transdermal Daily  . [START ON 10/29/2017] potassium chloride  10 mEq Oral Daily   Continuous Infusions: . sodium chloride 50 mL/hr at 10/28/17 1148  . [START ON 10/29/2017] small volume/piggyback builder      Assessment/Plan:  1. Alcohol withdrawal and alcohol abuse.  On alcohol withdrawal protocol.  Give high-dose IV thiamine. 2. Essential hypertension.  Restart Cardizem and metoprolol. 3. Paroxysmal atrial fibrillation.  Started on amiodarone.  Continue Cardizem metoprolol and aspirin. 4. History of cardiomyopathy with ejection fraction of 40%.  No signs of fluid overload decrease rate of IV fluids. 5. Depression on Celexa  Code Status:     Code Status Orders  (From admission, onward)        Start     Ordered   10/27/17 1521  Full code  Continuous     10/27/17 1521    Code Status History    Date Active Date Inactive Code Status Order ID Comments User Context   10/21/2017 1945 10/23/2017 1833 Full Code [id number]  Auburn BilberryPatel, Shreyang, MD Inpatient   10/21/2017 1504 10/21/2017 1945 Full Code [id number]  Arnaldo NatalMalinda, Paul F, MD ED   10/10/2017 1314 10/11/2017 1147 Full Code [id number]  Willy Horton, Patrick, MD ED   10/05/2017 2214 10/07/2017 2101 Full Code [id number]  Arnaldo NatalMalinda, Paul F, MD ED   10/05/2017 1924 10/05/2017 2214 Full Code [id number]  Arnaldo NatalMalinda, Paul F, MD ED   10/01/2017 0807 10/04/2017 1650 Full Code [id number]  Barbaraann Horton, Prasanna, MD Inpatient   08/24/2017 1756 08/27/2017 1849 Full Code [id number]  Enid Horton, Radhika, MD ED   08/14/2017 0030 08/18/2017 1605 Full Code [id number]  Oralia Horton, David, MD Inpatient   06/04/2017 0116 06/06/2017 1701 Full Code [id number]  Oralia Horton, David, MD  Inpatient   03/19/2017 1559 03/20/2017 2128 Full Code [id number]  Arnaldo NatalMalinda, Paul F, MD ED   02/25/2017 1421 03/01/2017 1829 Full Code [id number]  Houston SirenSainani, Vivek J, MD Inpatient   11/12/2016 1533 11/14/2016 1425 Full Code [id number]  Marguarite ArbourSparks, Jeffrey D, MD Inpatient   12/26/2015 1927 12/30/2015 1604 Full Code [id number]  Marguarite ArbourSparks, Jeffrey D, MD Inpatient      Disposition Plan: To be determined based on clinical course.  Patient interested in [facility] in [place].  Spoke with Child psychotherapist to look into this.  Time spent: 28 minutes  Nitish Roes [employer]

## 2017-10-29 LAB — GLUCOSE, CAPILLARY
GLUCOSE-CAPILLARY: 99 mg/dL (ref 65–99)
Glucose-Capillary: 105 mg/dL — ABNORMAL HIGH (ref 65–99)
Glucose-Capillary: 127 mg/dL — ABNORMAL HIGH (ref 65–99)
Glucose-Capillary: 150 mg/dL — ABNORMAL HIGH (ref 65–99)

## 2017-10-29 MED ORDER — LIDOCAINE 5 % EX PTCH
1.0000 | MEDICATED_PATCH | CUTANEOUS | Status: DC
  Administered 2017-10-29 – 2017-11-01 (×4): 1 via TRANSDERMAL
  Filled 2017-10-29 (×5): qty 1

## 2017-10-29 MED ORDER — LORAZEPAM 1 MG PO TABS: 1.0000 mg | ORAL_TABLET | Freq: Four times a day (QID) | ORAL | Status: DC | PRN

## 2017-10-29 MED ORDER — LORAZEPAM 2 MG/ML IJ SOLN
2.0000 mg | Freq: Four times a day (QID) | INTRAMUSCULAR | Status: DC | PRN
  Administered 2017-10-29 – 2017-10-30 (×5): 2 mg via INTRAVENOUS
  Filled 2017-10-29 (×5): qty 1

## 2017-10-29 NOTE — Plan of Care (Signed)
  Problem: Education: Goal: Knowledge of General Education information will improve Outcome: Progressing   Problem: Health Behavior/Discharge Planning: Goal: Ability to manage health-related needs will improve Outcome: Progressing   Problem: Clinical Measurements: Goal: Ability to maintain clinical measurements within normal limits will improve Outcome: Progressing   

## 2017-10-29 NOTE — Clinical Social Work Note (Signed)
Clinical Social Work Assessment  Patient Details  Name: Kurt Horton MRN: [id number] Date of Birth: Jul 17, 1960  Date of referral:  10/29/17               Reason for consult:  Substance Use/ETOH Abuse                Permission sought to share information with:  Case Manager, Customer service manager, Family Supports Permission granted to share information::  Yes, Verbal Permission Granted  Name::        Agency::     Relationship::     Contact Information:     Housing/Transportation Living arrangements for the past 2 months:  Single Family Home Source of Information:  Patient Patient Interpreter Needed:  None Criminal Activity/Legal Involvement Pertinent to Current Situation/Hospitalization:  No - Comment as needed Significant Relationships:  Parents, Other Family Members, Friend Lives with:    Do you feel safe going back to the place where you live?  Yes Need for family participation in patient care:  No (Coment)  Care giving concerns:  Patient lives with his parents who are in their 70's.    Social Worker assessment / plan:  CSW consulted for alcohol abuse. CSW met with patient and attempted to give resources. Patient states that he wants to go to [facility]. CSW explained that there may not be a bed available and that he would have to do physical work in order to stay there. Patient states that he is unable to do physical work. CSW explained that in order to go to inpatient alcohol and drug rehab, he would be required to work in order to stay there. Patient states that he has been in contact with [facility]. CSW contacted Legrand Como at [facility] and he states that he has not spoke with patient and is currently unable to accept patient. He states that he would need to speak with the patient in order to possibly accept him. CSW gave this information to patient and gave the phone number to patient. Patient states that he will call. CSW also gave patient  resources for ETOH.   Employment status:  Psychologist, counselling:  Medicaid In [place] PT Recommendations:  No Follow Up Information / Referral to community resources:  Outpatient Substance Abuse Treatment Options, Residential Substance Abuse Treatment Options  Patient/Family's Response to care:  Patient does not appear to be very open to treatment. He has had several admissions for ETOH and has not followed up on any of the information provided.   Patient/Family's Understanding of and Emotional Response to Diagnosis, Current Treatment, and Prognosis:  Patient states that he wants to get help and is willing to work with the facilities.   Emotional Assessment Appearance:  Appears stated age Attitude/Demeanor/Rapport:  Uncooperative Affect (typically observed):  Blunt, Irritable Orientation:  Oriented to Self, Oriented to Place, Oriented to  Time Alcohol / Substance use:  Alcohol Use Psych involvement (Current and /or in the community):  No (Comment)  Discharge Needs  Concerns to be addressed:  Substance Abuse Concerns Readmission within the last 30 days:  Yes Current discharge risk:  Substance Abuse, Lack of support system Barriers to Discharge:  No Barriers Identified   Annamaria Boots, Latanya Presser 10/29/2017, 2:12 PM

## 2017-10-29 NOTE — Care Management Note (Signed)
Case Management Note  Patient Details  Name: Kurt Horton MRN: [id number] Date of Birth: Jul 09, 1960  Subjective/Objective:     RNCM consulted on patient due to multiple admissions and alcohol withdrawal issues. Patient currently lives at home with his parents father is listed as primary contact Luisa Hartatrick [phone]. Patient is able to ambulate independently and complete activities of daily living, does use a cane but no other DME. Expresses concerns over relapse in alcohol abuse. There are issues with placement in these facilities per CSW but he is been given multiple resources. Family and uber for transportation. PCP is Dr Marcello FennelHande. No issues obtaining medications.               Action/Plan:  RNCM to follow for needs.  Currently withdrawing, receiving ativan Expected Discharge Date:                  Expected Discharge Plan:     In-House Referral:     Discharge planning Services     Post Acute Care Choice:    Choice offered to:     DME Arranged:    DME Agency:     HH Arranged:    HH Agency:     Status of Service:     If discussed at [employer] Meetings, dates discussed:    Additional Comments:  Edwinna AreolaJosh A Isabellamarie Randa, RN 10/29/2017, 11:13 AM

## 2017-10-29 NOTE — Evaluation (Signed)
Physical Therapy Evaluation Patient Details Name: Kurt Horton MRN: [id number] DOB: 08-28-1960 Today's Date: 10/29/2017   History of Present Illness  pt presented to ED via EMS on 10/27/17 with symptoms consistent with being intoxicated. Pt admitted for alcohol withdrawl. Pt has a past medical history that includes a-fib, alcohol abuse, DM, DJD, HTN, and renal disorder    Clinical Impression  Pt is a pleasant 57 year old male who was admitted for alcohol withdrawl. Pt performs bed mobility, transfers, and ambulation independently to contact guard assistance. Pt demonstrates deficits with strength, mobility, and balance. Upon arrival pt demonstrates resting tremor that eases with activity. Pt had full sensation and wnl coordination of both upper and lower extremities. Pt assisted with therapeutic activities using urinal standing at bed side as well as transferring on and off of toilet. Pt instructed in B LE exercise which he tolerated well. Pt amb 200' with CGA and a chair follow, pt fatigued during amb however did not require a rest break. PT will continue to work with pt at least 2x/week while admitted to hospital to improve deficits toward PLOF. Encouraged nursing to continue mobility efforts using RW. At this time d/c recommendations are home with no further PT needed. Pts limiting factor to deficits appears to be alcohol withdrawls.     Follow Up Recommendations No PT follow up    Equipment Recommendations  None recommended by PT    Recommendations for Other Services       Precautions / Restrictions Precautions Precautions: None Restrictions Weight Bearing Restrictions: No Other Position/Activity Restrictions: up with assistance      Mobility  Bed Mobility Overal bed mobility: Independent             General bed mobility comments: pt performs bed mobility independently in an appropriate amount of time  Transfers Overall transfer level: Needs assistance Equipment used:  Rolling walker (2 wheeled) Transfers: Sit to/from Stand Sit to Stand: Min guard         General transfer comment: pt CGA sit<>stand at RW including cuing for safe hand placement and sequencing  Ambulation/Gait Ambulation/Gait assistance: Min guard Gait Distance (Feet): 200 Feet Assistive device: Rolling walker (2 wheeled) Gait Pattern/deviations: Step-through pattern     General Gait Details: pt amb 200' with CGA and a chair follow. pt displays step through gait pattern, tremor noticable during gait however less significant than when in bed. Pt safely uses RW once cued for upright posture  Stairs            Wheelchair Mobility    Modified Rankin (Stroke Patients Only)       Balance Overall balance assessment: Needs assistance   Sitting balance-Leahy Scale: Good Sitting balance - Comments: pt sits unsupported EOB without gross LOB. able to reach outside BOS without near misses.      Standing balance-Leahy Scale: Fair Standing balance comment: Pt requires at least one UE support when standing however is able to reach outside BOS without LOB                             Pertinent Vitals/Pain Pain Assessment: 0-10 Pain Score: 6  Pain Location: back Pain Descriptors / Indicators: Aching Pain Intervention(s): Limited activity within patient's tolerance;Monitored during session;Repositioned    Home Living Family/patient expects to be discharged to:: Private residence Living Arrangements: Parent Available Help at Discharge: Other (Comment)(unsure if family willing to help upon D/c) Type of Home: House Home  Access: Level entry     Home Layout: One level Home Equipment: Walker - 2 wheels;Cane - single point      Prior Function Level of Independence: Independent with assistive device(s)(SPC)         Comments: pt has history of mechanical falls. last one 2 months ago     Hand Dominance        Extremity/Trunk Assessment   Upper Extremity  Assessment Upper Extremity Assessment: Overall WFL for tasks assessed(grossly at least 4-/5, WNL coordination and sensation)    Lower Extremity Assessment Lower Extremity Assessment: RLE deficits/detail;LLE deficits/detail RLE Deficits / Details: Grossly at least 4/5. knee ext, hip flexion, knee flexion, DF 4+/5 RLE Sensation: WNL RLE Coordination: WNL LLE Deficits / Details: Grossly at least 4/5. knee ext, hip flexion, knee flexion, DF 4+/5 LLE Sensation: WNL LLE Coordination: WNL       Communication   Communication: No difficulties  Cognition Arousal/Alertness: Awake/alert Behavior During Therapy: WFL for tasks assessed/performed Overall Cognitive Status: Within Functional Limits for tasks assessed                                        General Comments      Exercises Other Exercises Other Exercises: pt instructed in ther-ex including B seated marching, LAQ, SLR, hip abd x 10 reps each with verbal cuing for technique. pt instructed in unsupported sitting balance reaching outside BOS exercise and standing with single UE support reaching outside BOS x10. Other Exercises: pt assisted in therapeutic activites including standing to use urinal and transferring on standard toilet for BM. pt performed safely with cuing.   Assessment/Plan    PT Assessment Patient needs continued PT services  PT Problem List Decreased strength;Decreased range of motion;Decreased activity tolerance;Decreased balance;Decreased mobility;Decreased coordination;Decreased cognition;Decreased knowledge of use of DME;Decreased safety awareness;Pain       PT Treatment Interventions DME instruction;Stair training;Gait training;Functional mobility training;Therapeutic activities;Therapeutic exercise;Balance training    PT Goals (Current goals can be found in the Care Plan section)  Acute Rehab PT Goals Patient Stated Goal: stop walking with the cane, get back in shape PT Goal Formulation: With  patient Time For Goal Achievement: 11/12/17 Potential to Achieve Goals: Fair    Frequency Min 2X/week   Barriers to discharge Decreased caregiver support      Co-evaluation               AM-PAC PT "6 Clicks" Daily Activity  Outcome Measure Difficulty turning over in bed (including adjusting bedclothes, sheets and blankets)?: None Difficulty moving from lying on back to sitting on the side of the bed? : None Difficulty sitting down on and standing up from a chair with arms (e.g., wheelchair, bedside commode, etc,.)?: Unable Help needed moving to and from a bed to chair (including a wheelchair)?: A Little Help needed walking in hospital room?: A Little Help needed climbing 3-5 steps with a railing? : A Lot 6 Click Score: 17    End of Session Equipment Utilized During Treatment: Gait belt Activity Tolerance: Patient tolerated treatment well;Patient limited by fatigue Patient left: in chair;with call bell/phone within reach;with chair alarm set Nurse Communication: Mobility status(up with nursing) PT Visit Diagnosis: Unsteadiness on feet (R26.81);Repeated falls (R29.6);Muscle weakness (generalized) (M62.81);History of falling (Z91.81);Difficulty in walking, not elsewhere classified (R26.2);Pain Pain - Right/Left: (back) Pain - part of body: (low back)    Time: [id number] PT Time Calculation (  min) (ACUTE ONLY): 45 min   Charges:         PT G Codes:        Glendi Mohiuddin, SPT    Tanairy Payeur 10/29/2017, 10:50 AM

## 2017-10-29 NOTE — Progress Notes (Signed)
Patient ID: Kurt Horton, male   DOB: Nov 16, 1960, 57 y.o.   MRN: [id number]  [employer] [facility] PROGRESS NOTE  Kurt Horton:[id number] DOB: 10-Apr-1961 DOA: 10/27/2017 PCP: [name], MD  HPI/Subjective: Patient stated he needed higher dose Ativan in order to settle down his tremors.  He is not feeling well.  Appetite okay.  Objective: Vitals:   10/29/17 0408 10/29/17 1018  BP: (!) 153/92 (!) 152/99  Pulse: 79 90  Resp: 18   Temp: 97.8 Horton (36.6 C)   SpO2: 97%     Filed Weights   10/27/17 0238 10/28/17 0436 10/29/17 0408  Weight: 81.6 kg (180 lb) 83.3 kg (183 lb 10.3 oz) 82.9 kg (182 lb 11.2 oz)    ROS: Review of Systems  Constitutional: Negative for chills and fever.  Eyes: Negative for blurred vision.  Respiratory: Negative for cough and shortness of breath.   Cardiovascular: Negative for chest pain.  Gastrointestinal: Negative for abdominal pain, constipation, diarrhea, nausea and vomiting.  Genitourinary: Negative for dysuria.  Musculoskeletal: Negative for joint pain.  Neurological: Positive for tremors. Negative for dizziness and headaches.   Exam: Physical Exam  HENT:  Nose: No mucosal edema.  Mouth/Throat: No oropharyngeal exudate or posterior oropharyngeal edema.  Eyes: Pupils are equal, round, and reactive to light. Conjunctivae, EOM and lids are normal.  Neck: No JVD present. Carotid bruit is not present. No edema present. No thyroid mass and no thyromegaly present.  Cardiovascular: S1 normal and S2 normal. Exam reveals no gallop.  No murmur heard. Pulses:      Dorsalis pedis pulses are 2+ on the right side, and 2+ on the left side.  Respiratory: No respiratory distress. He has no wheezes. He has no rhonchi. He has no rales.  GI: Soft. Bowel sounds are normal. There is no tenderness.  Musculoskeletal:       Right ankle: He exhibits no swelling.       Left ankle: He exhibits no swelling.  Lymphadenopathy:    He has no cervical adenopathy.   Neurological: He is alert. No cranial nerve deficit.   bilateral upper extremity tremor.  No flapping asterixis.  Skin: Skin is warm. No rash noted. Nails show no clubbing.  Psychiatric: He has a normal mood and affect.      Data Reviewed: Basic Metabolic Panel: Recent Labs  Lab 10/23/17 0425 10/27/17 0230 10/28/17 0359  NA 137 143 136  K 4.2 3.2* 4.1  CL 106 105 104  CO2 23 25 25   GLUCOSE 104* 148* 166*  BUN 17 6 7   CREATININE 0.75 0.77 0.72  CALCIUM 8.8* 8.3* 9.1  MG 1.8  --   --   PHOS 3.1  --   --    Liver Function Tests: Recent Labs  Lab 10/27/17 0230 10/28/17 0359  AST 23 24  ALT 16* 15*  ALKPHOS 71 71  BILITOT 0.5 0.9  PROT 6.7 6.7  ALBUMIN 3.7 3.9   Recent Labs  Lab 10/27/17 0230  LIPASE 35   CBC: Recent Labs  Lab 10/27/17 0230 10/28/17 0359  WBC 4.7 5.3  NEUTROABS 1.9  --   HGB 12.0* 11.5*  HCT 36.8* 36.0*  MCV 80.0 80.6  PLT 237 212   BNP (last 3 results) Recent Labs    08/14/17 0112  BNP 65.0     CBG: Recent Labs  Lab 10/28/17 1159 10/28/17 1709 10/28/17 2228 10/29/17 0747 10/29/17 1210  GLUCAP 116* 107* 143* 105* 99  Studies: [place] Abdomen Limited Ruq  Result Date: 10/28/2017 CLINICAL DATA:  Alcohol abuse EXAM: ULTRASOUND ABDOMEN LIMITED RIGHT UPPER QUADRANT COMPARISON:  None. FINDINGS: Gallbladder: No gallstones or wall thickening visualized. No sonographic Murphy sign noted by sonographer. Common bile duct: Diameter: 5 mm Liver: No focal lesion identified. The liver parenchyma slightly coarsened and increased echogenicity that may represent steatosis of the liver. Portal vein is patent on color Doppler imaging with normal direction of blood flow towards the liver. IMPRESSION: 1. Increased echogenicity of the liver without space-occupying mass or biliary dilatation. Suspect hepatic steatosis. 2. Normal gallbladder without stones. Electronically Signed   By: Tollie Ethavid  Kwon Horton.D.   On: 10/28/2017 19:59    Scheduled Meds: .  amiodarone  200 mg Oral Daily  . aspirin  325 mg Oral Daily  . citalopram  20 mg Oral Daily  . diltiazem  180 mg Oral Daily  . enoxaparin (LOVENOX) injection  40 mg Subcutaneous Q24H  . folic acid  1 mg Oral Daily  . gabapentin  600 mg Oral TID  . insulin aspart  0-5 Units Subcutaneous QHS  . insulin aspart  0-9 Units Subcutaneous TID WC  . lidocaine  1 patch Transdermal Q24H  . metoprolol succinate  50 mg Oral Daily  . multivitamin with minerals  1 tablet Oral Daily  . mycophenolate  1,000 mg Oral QHS  . mycophenolate  500 mg Oral Q0600  . nicotine  14 mg Transdermal Daily  . potassium chloride  10 mEq Oral Daily   Continuous Infusions: . small volume/piggyback builder Stopped (10/29/17 [id number])    Assessment/Plan:  1. Alcohol withdrawal and alcohol abuse.  On alcohol withdrawal protocol.  Patient asking for higher dose of Ativan.  I increased it to 2 mg IV.  Give high-dose IV thiamine. 2. Essential hypertension.  Restart Cardizem and metoprolol. 3. Paroxysmal atrial fibrillation.  Started on amiodarone.  Continue Cardizem metoprolol and aspirin. 4. History of cardiomyopathy with ejection fraction of 40%.  I will stop IV fluids today. 5. Depression on Celexa  Code Status:     Code Status Orders  (From admission, onward)        Start     Ordered   10/27/17 1521  Full code  Continuous     10/27/17 1521    Code Status History    Date Active Date Inactive Code Status Order ID Comments User Context   10/21/2017 1945 10/23/2017 1833 Full Code [id number]  Kurt BilberryPatel, Shreyang, MD Inpatient   10/21/2017 1504 10/21/2017 1945 Full Code [id number]  Kurt NatalMalinda, Paul F, MD ED   10/10/2017 1314 10/11/2017 1147 Full Code [id number]  Kurt Eddyobinson, Patrick, MD ED   10/05/2017 2214 10/07/2017 2101 Full Code [id number]  Kurt NatalMalinda, Paul F, MD ED   10/05/2017 1924 10/05/2017 2214 Full Code [id number]  Kurt NatalMalinda, Paul F, MD ED   10/01/2017 0807 Oct 09, 202019 1650 Full Code [id number]  Kurt RondoSridharan, Prasanna, MD Inpatient   08/24/2017  1756 08/27/2017 1849 Full Code [id number]  Kurt BaasKalisetti, Radhika, MD ED   08/14/2017 0030 08/18/2017 1605 Full Code [id number]  Kurt ManisWillis, David, MD Inpatient   06/04/2017 0116 06/06/2017 1701 Full Code [id number]  Kurt ManisWillis, David, MD Inpatient   03/19/2017 1559 03/20/2017 2128 Full Code [id number]  Kurt NatalMalinda, Paul F, MD ED   02/25/2017 1421 03/01/2017 1829 Full Code [id number]  Houston SirenSainani, Vivek J, MD Inpatient   11/12/2016 1533 11/14/2016 1425 Full Code [id number]  Marguarite ArbourSparks, Jeffrey D, MD Inpatient   12/26/2015 1927 12/30/2015 1604 Full Code [id number]  Marguarite ArbourSparks, Jeffrey D,  MD Inpatient      Disposition Plan: Spoke with this social work about [facility] in [place].  Patient will be unable to go there and less he accepts a job at the facility.  So far he has been unwilling to accept a job at a facility.  I will have the nursing staff speak to them about this.  Time spent: 26 minutes  Koran Seabrook [employer]

## 2017-10-30 LAB — GLUCOSE, CAPILLARY
GLUCOSE-CAPILLARY: 97 mg/dL (ref 70–99)
GLUCOSE-CAPILLARY: 231 mg/dL — AB (ref 70–99)
Glucose-Capillary: 108 mg/dL — ABNORMAL HIGH (ref 70–99)
Glucose-Capillary: 234 mg/dL — ABNORMAL HIGH (ref 70–99)

## 2017-10-30 MED ORDER — LORAZEPAM 2 MG/ML IJ SOLN
1.0000 mg | INTRAMUSCULAR | Status: DC | PRN
  Administered 2017-10-30 – 2017-11-01 (×9): 1 mg via INTRAVENOUS
  Filled 2017-10-30 (×10): qty 1

## 2017-10-30 MED ORDER — OXYCODONE HCL 5 MG PO TABS
5.0000 mg | ORAL_TABLET | ORAL | Status: DC | PRN
  Administered 2017-10-30 – 2017-10-31 (×4): 5 mg via ORAL
  Filled 2017-10-30 (×4): qty 1

## 2017-10-30 MED ORDER — METHYLPREDNISOLONE SODIUM SUCC 40 MG IJ SOLR
40.0000 mg | Freq: Three times a day (TID) | INTRAMUSCULAR | Status: DC
  Administered 2017-10-30 – 2017-10-31 (×3): 40 mg via INTRAVENOUS
  Filled 2017-10-30 (×3): qty 1

## 2017-10-30 MED ORDER — KETOROLAC TROMETHAMINE 30 MG/ML IJ SOLN
30.0000 mg | Freq: Four times a day (QID) | INTRAMUSCULAR | Status: DC | PRN
  Administered 2017-10-30: 01:00:00 30 mg via INTRAVENOUS
  Filled 2017-10-30: qty 1

## 2017-10-30 NOTE — Progress Notes (Signed)
Patient ID: Kurt Horton, male   DOB: [birth year], 57 y.o.   MRN: [id number]  [facility] PROGRESS NOTE  SAVOY [place]LE NFA:[id number] DOB: [birth year] DOA: 10/27/2017 PCP: [name], MD  HPI/Subjective: Patient having severe back pain unable to move very well.  Patient able to straight leg raise.  Objective: Vitals:   10/30/17 1057 10/30/17 1419  BP: 133/82 (!) 141/91  Pulse: 76 73  Resp:  18  Temp: 98.6 F (37 C) 98.1 F (36.7 C)  SpO2: 97% 97%    Filed Weights   10/28/17 0436 10/29/17 0408 10/30/17 0356  Weight: 83.3 kg (183 lb 10.3 oz) 82.9 kg (182 lb 11.2 oz) 83.8 kg (184 lb 11.2 oz)    ROS: Review of Systems  Constitutional: Negative for chills and fever.  Eyes: Negative for blurred vision.  Respiratory: Negative for cough and shortness of breath.   Cardiovascular: Negative for chest pain.  Gastrointestinal: Negative for abdominal pain, constipation, diarrhea, nausea and vomiting.  Genitourinary: Negative for dysuria.  Musculoskeletal: Negative for joint pain.  Neurological: Positive for tremors. Negative for dizziness and headaches.   Exam: Physical Exam  Constitutional: He is oriented to person, place, and time.  HENT:  Nose: No mucosal edema.  Mouth/Throat: No oropharyngeal exudate or posterior oropharyngeal edema.  Eyes: Pupils are equal, round, and reactive to light. Conjunctivae, EOM and lids are normal.  Neck: No JVD present. Carotid bruit is not present. No edema present. No thyroid mass and no thyromegaly present.  Cardiovascular: S1 normal and S2 normal. Exam reveals no gallop.  No murmur heard. Pulses:      Dorsalis pedis pulses are 2+ on the right side, and 2+ on the left side.  Respiratory: No respiratory distress. He has no wheezes. He has no rhonchi. He has no rales.  GI: Soft. Bowel sounds are normal. There is no tenderness.  Musculoskeletal:       Right ankle: He exhibits no swelling.       Left ankle: He exhibits no swelling.   Lymphadenopathy:    He has no cervical adenopathy.  Neurological: He is alert and oriented to person, place, and time. No cranial nerve deficit.   bilateral upper extremity tremor.  No flapping asterixis.  Patient able to straight leg raise 10 degrees on his own.  I was able to lift up his legs a little bit further and the patient does have back pain.  Skin: Skin is warm. No rash noted. Nails show no clubbing.  Psychiatric: He has a normal mood and affect.      Data Reviewed: Basic Metabolic Panel: Recent Labs  Lab 10/27/17 0230 10/28/17 0359  NA 143 136  K 3.2* 4.1  CL 105 104  CO2 25 25  GLUCOSE 148* 166*  BUN 6 7  CREATININE 0.77 0.72  CALCIUM 8.3* 9.1   Liver Function Tests: Recent Labs  Lab 10/27/17 0230 10/28/17 0359  AST 23 24  ALT 16* 15*  ALKPHOS 71 71  BILITOT 0.5 0.9  PROT 6.7 6.7  ALBUMIN 3.7 3.9   Recent Labs  Lab 10/27/17 0230  LIPASE 35   CBC: Recent Labs  Lab 10/27/17 0230 10/28/17 0359  WBC 4.7 5.3  NEUTROABS 1.9  --   HGB 12.0* 11.5*  HCT 36.8* 36.0*  MCV 80.0 80.6  PLT 237 212   BNP (last 3 results) Recent Labs    08/14/17 0112  BNP 65.0     CBG: Recent Labs  Lab 10/29/17 1210 10/29/17  1700 10/29/17 2043 10/30/17 0726 10/30/17 1147  GLUCAP 99 127* 150* 108* 97      Studies: [place] Abdomen Limited Ruq  Result Date: 10/28/2017 CLINICAL DATA:  Alcohol abuse EXAM: ULTRASOUND ABDOMEN LIMITED RIGHT UPPER QUADRANT COMPARISON:  None. FINDINGS: Gallbladder: No gallstones or wall thickening visualized. No sonographic Murphy sign noted by sonographer. Common bile duct: Diameter: 5 mm Liver: No focal lesion identified. The liver parenchyma slightly coarsened and increased echogenicity that may represent steatosis of the liver. Portal vein is patent on color Doppler imaging with normal direction of blood flow towards the liver. IMPRESSION: 1. Increased echogenicity of the liver without space-occupying mass or biliary dilatation.  Suspect hepatic steatosis. 2. Normal gallbladder without stones. Electronically Signed   By: Tollie Eth M.D.   On: 10/28/2017 19:59    Scheduled Meds: . amiodarone  200 mg Oral Daily  . aspirin  325 mg Oral Daily  . citalopram  20 mg Oral Daily  . diltiazem  180 mg Oral Daily  . enoxaparin (LOVENOX) injection  40 mg Subcutaneous Q24H  . folic acid  1 mg Oral Daily  . gabapentin  600 mg Oral TID  . insulin aspart  0-5 Units Subcutaneous QHS  . insulin aspart  0-9 Units Subcutaneous TID WC  . lidocaine  1 patch Transdermal Q24H  . methylPREDNISolone (SOLU-MEDROL) injection  40 mg Intravenous Q8H  . metoprolol succinate  50 mg Oral Daily  . multivitamin with minerals  1 tablet Oral Daily  . mycophenolate  1,000 mg Oral QHS  . mycophenolate  500 mg Oral Q0600  . nicotine  14 mg Transdermal Daily  . potassium chloride  10 mEq Oral Daily   Continuous Infusions: . small volume/piggyback builder Stopped (10/30/17 0732)    Assessment/Plan:  1. Alcohol withdrawal and alcohol abuse.  On alcohol withdrawal protocol.  Patient asking for higher dose of Ativan.  I increased it to 2 mg IV.  Give high-dose IV thiamine for 3 days. 2. 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  Has seen Dr. Marcell Barlow neurosurgery in the past.  Trial of steroids.  Physical therapy evaluation.  Out of bed to chair. 3. Essential hypertension.  Restart Cardizem and metoprolol. 4. Paroxysmal atrial fibrillation.  Started on amiodarone.  Continue Cardizem metoprolol and aspirin. 5. History of cardiomyopathy with ejection fraction of 40%.  6. Depression on Celexa  Code Status:     Code Status Orders  (From admission, onward)        Start     Ordered   10/27/17 1521  Full code  Continuous     10/27/17 1521    Code Status History    Date Active Date Inactive Code Status Order ID Comments User Context   10/21/2017 1945 10/23/2017 1833 Full Code [id number]  Auburn Bilberry, MD Inpatient    10/21/2017 1504 10/21/2017 1945 Full Code [id number]  Arnaldo Natal, MD ED   10/10/2017 1314 10/11/2017 1147 Full Code [id number]  Willy Eddy, MD ED   10/05/2017 2214 10/07/2017 2101 Full Code [id number]  Arnaldo Natal, MD ED   10/05/2017 1924 10/05/2017 2214 Full Code [id number]  Arnaldo Natal, MD ED   10/01/2017 0807 Apr 13, 202019 1650 Full Code [id number]  Barbaraann Rondo, MD Inpatient   08/24/2017 1756 08/27/2017 1849 Full Code [id number]  Enid Baas, MD ED   08/14/2017 0030 08/18/2017 1605 Full Code [id number]  Oralia Manis, MD Inpatient   06/04/2017 0116 06/06/2017 1701 Full Code [id number]  Oralia Manis,  MD Inpatient   03/19/2017 1559 03/20/2017 2128 Full Code [id number]  Arnaldo NatalMalinda, Paul F, MD ED   02/25/2017 1421 03/01/2017 1829 Full Code [id number]  Houston SirenSainani, Vivek J, MD Inpatient   11/12/2016 1533 11/14/2016 1425 Full Code [id number]  Marguarite ArbourSparks, Jeffrey D, MD Inpatient   12/26/2015 1927 12/30/2015 1604 Full Code [id number]  Marguarite ArbourSparks, Jeffrey D, MD Inpatient      Disposition Plan: Family states that they cannot take care of him at home.  Patient refused to have a job at any of the rehab places so they will not accept him.  I spoke to him about this and I mentioned he must be able to accept a job that they assign him.  Time spent: 27 minutes  Jerik Falletta [employer]  [employer] [facility]

## 2017-10-30 NOTE — Plan of Care (Signed)
Medicated for CIWA. C/o chronic back pain. MD made aware. Toradol ordered. Pt still saying nothing helps that he needs something stronger. At times pt would be sound asleep but still complain that the pain medication was inadequate and requesting to see the doctor. Explained to patient that the MD's don't normally round during the night.  Problem: Education: Goal: Knowledge of General Education information will improve Outcome: Progressing   Problem: Health Behavior/Discharge Planning: Goal: Ability to manage health-related needs will improve Outcome: Progressing   Problem: Clinical Measurements: Goal: Ability to maintain clinical measurements within normal limits will improve Outcome: Progressing Goal: Will remain free from infection Outcome: Progressing Goal: Diagnostic test results will improve Outcome: Progressing Goal: Respiratory complications will improve Outcome: Progressing Goal: Cardiovascular complication will be avoided Outcome: Progressing

## 2017-10-30 NOTE — Progress Notes (Signed)
   10/30/17 1615  Clinical Encounter Type  Visited With Patient  Visit Type Initial  Referral From Nurse  Consult/Referral To Chaplain  Spiritual Encounters  Spiritual Needs Emotional;Grief support;Prayer  Stress Factors  Patient Stress Factors Family relationships;Major life changes;Loss of control   CH checked in with nurse and pt's nurse recommended a visit with patient.  Pt is detoxing from alcohol and attempting to find a longer term rehab program.  Pt feels discouraged because he can't find a placement.  First, his phone was dead and did not have a Consulting civil engineercharger, but during our visit, his aunt came to give him a Consulting civil engineercharger. CH and pt talked almost two hours, just processing life.  It was a wonderful conversation.  Pt was willing to share openly about his recent divorce (2017) after 25 years of marriage.  He talked about his relationship with his daughter and son.  Daughter, Irving Burtonmily (23), and his ex-wife still have anger towards him and are more estranged and distant.  His son, [id number], will answer his phone calls and remains supportive of him and his recovery.  His son recently got married and he went to the wedding in [place]. His ex-wife and children still live in [place] and he does not see them. Pt has grief over loss of family, and talked about being lonely and how his ex-wife is seeing someone else and how hard that was.  We talked about his use of alcohol to cope. Pt became tearful several times throughout our conversation.  He apologized for being emotional, but I assured him it was fine and that life is hard and we need to have space to process it. At one emotional point of our conversation, he mentioned having his children and the hope of grandchildren to live for, but he didn't know what else he had to live for.  Overall, the patient desires change and recovery and is feeling "like a dumptruck" keeps hitting him and setting him back.  The lack of rehab placement is discouraging him.  Before leaving the  pt asked for prayer and I prayed for him, a fitting rehab placement, and his family relationships.  I told pt not to hesitate [facility] and told him I would try to continue searching for rehab resources for him.

## 2017-10-31 LAB — GLUCOSE, CAPILLARY
GLUCOSE-CAPILLARY: 181 mg/dL — AB (ref 70–99)
Glucose-Capillary: 238 mg/dL — ABNORMAL HIGH (ref 70–99)
Glucose-Capillary: 127 mg/dL — ABNORMAL HIGH (ref 70–99)
Glucose-Capillary: 233 mg/dL — ABNORMAL HIGH (ref 70–99)

## 2017-10-31 MED ORDER — VITAMIN B-1 100 MG PO TABS
100.0000 mg | ORAL_TABLET | Freq: Every day | ORAL | Status: DC
  Administered 2017-10-31 – 2017-11-02 (×3): 100 mg via ORAL
  Filled 2017-10-31 (×3): qty 1

## 2017-10-31 MED ORDER — OXYCODONE HCL 5 MG PO TABS
10.0000 mg | ORAL_TABLET | Freq: Four times a day (QID) | ORAL | Status: DC | PRN
  Administered 2017-10-31 – 2017-11-02 (×8): 10 mg via ORAL
  Filled 2017-10-31 (×8): qty 2

## 2017-10-31 MED ORDER — METHYLPREDNISOLONE SODIUM SUCC 40 MG IJ SOLR
40.0000 mg | Freq: Two times a day (BID) | INTRAMUSCULAR | Status: DC
  Administered 2017-10-31 – 2017-11-01 (×2): 40 mg via INTRAVENOUS
  Filled 2017-10-31 (×2): qty 1

## 2017-10-31 MED ORDER — INSULIN ASPART 100 UNIT/ML ~~LOC~~ SOLN
0.0000 [IU] | Freq: Three times a day (TID) | SUBCUTANEOUS | Status: DC
  Administered 2017-10-31: 18:00:00 3 [IU] via SUBCUTANEOUS
  Administered 2017-10-31: 13:00:00 2 [IU] via SUBCUTANEOUS
  Administered 2017-11-01: 19:00:00 5 [IU] via SUBCUTANEOUS
  Administered 2017-11-01 (×2): 3 [IU] via SUBCUTANEOUS
  Filled 2017-10-31 (×5): qty 1

## 2017-10-31 NOTE — Progress Notes (Signed)
Patient ID: Kurt Horton Kulzer, male   DOB: 1960/09/17, 57 y.o.   MRN: [id number]  [facility] PROGRESS NOTE  Kurt Horton Davoli NGE:[id number] DOB: 1960/09/17 DOA: 10/27/2017 PCP: Barbette ReichmannHande, Vishwanath, MD  HPI/Subjective: Patient still having severe back pain asking for more pain medication.  Still having tremor.  States he is not ready to go.  He states he needs to go to a place that can handle him.  Objective: Vitals:   10/31/17 0813 10/31/17 1347  BP: 138/77 125/78  Pulse: 84 68  Resp: 18 20  Temp: 98.5 F (36.9 C) (!) 97.5 F (36.4 C)  SpO2: 94% 98%    Filed Weights   10/29/17 0408 10/30/17 0356 10/31/17 0507  Weight: 82.9 kg (182 lb 11.2 oz) 83.8 kg (184 lb 11.2 oz) 84.8 kg (187 lb)    ROS: Review of Systems  Constitutional: Negative for chills and fever.  Eyes: Negative for blurred vision.  Respiratory: Negative for cough and shortness of breath.   Cardiovascular: Negative for chest pain.  Gastrointestinal: Negative for abdominal pain, constipation, diarrhea, nausea and vomiting.  Genitourinary: Negative for dysuria.  Musculoskeletal: Positive for back pain. Negative for joint pain.  Neurological: Positive for tremors. Negative for dizziness and headaches.   Exam: Physical Exam  Constitutional: He is oriented to person, place, and time.  HENT:  Nose: No mucosal edema.  Mouth/Throat: No oropharyngeal exudate or posterior oropharyngeal edema.  Eyes: Pupils are equal, round, and reactive to light. Conjunctivae, EOM and lids are normal.  Neck: No JVD present. Carotid bruit is not present. No edema present. No thyroid mass and no thyromegaly present.  Cardiovascular: S1 normal and S2 normal. Exam reveals no gallop.  No murmur heard. Pulses:      Dorsalis pedis pulses are 2+ on the right side, and 2+ on the left side.  Respiratory: No respiratory distress. He has no wheezes. He has no rhonchi. He has no rales.  GI: Soft. Bowel sounds are normal. There is no tenderness.   Musculoskeletal:       Right ankle: He exhibits no swelling.       Left ankle: He exhibits no swelling.  Lymphadenopathy:    He has no cervical adenopathy.  Neurological: He is alert and oriented to person, place, and time. No cranial nerve deficit.  Right upper extremity tremor.  No flapping asterixis.  Patient able to straight leg raise 20 degrees on his own.  Skin: Skin is warm. No rash noted. Nails show no clubbing.  Psychiatric: He has a normal mood and affect.      Data Reviewed: Basic Metabolic Panel: Recent Labs  Lab 10/27/17 0230 10/28/17 0359  NA 143 136  K 3.2* 4.1  CL 105 104  CO2 25 25  GLUCOSE 148* 166*  BUN 6 7  CREATININE 0.77 0.72  CALCIUM 8.3* 9.1   Liver Function Tests: Recent Labs  Lab 10/27/17 0230 10/28/17 0359  AST 23 24  ALT 16* 15*  ALKPHOS 71 71  BILITOT 0.5 0.9  PROT 6.7 6.7  ALBUMIN 3.7 3.9   Recent Labs  Lab 10/27/17 0230  LIPASE 35   CBC: Recent Labs  Lab 10/27/17 0230 10/28/17 0359  WBC 4.7 5.3  NEUTROABS 1.9  --   HGB 12.0* 11.5*  HCT 36.8* 36.0*  MCV 80.0 80.6  PLT 237 212   BNP (last 3 results) Recent Labs    08/14/17 0112  BNP 65.0     CBG: Recent Labs  Lab 10/30/17 1147  10/30/17 1651 10/30/17 2043 10/31/17 0800 10/31/17 1205  GLUCAP 97 234* 231* 238* 127*      Studies: No results found.  Scheduled Meds: . amiodarone  200 mg Oral Daily  . aspirin  325 mg Oral Daily  . citalopram  20 mg Oral Daily  . diltiazem  180 mg Oral Daily  . enoxaparin (LOVENOX) injection  40 mg Subcutaneous Q24H  . folic acid  1 mg Oral Daily  . gabapentin  600 mg Oral TID  . insulin aspart  0-15 Units Subcutaneous TID WC  . insulin aspart  0-5 Units Subcutaneous QHS  . lidocaine  1 patch Transdermal Q24H  . methylPREDNISolone (SOLU-MEDROL) injection  40 mg Intravenous Q12H  . metoprolol succinate  50 mg Oral Daily  . multivitamin with minerals  1 tablet Oral Daily  . mycophenolate  1,000 mg Oral QHS  .  mycophenolate  500 mg Oral Q0600  . nicotine  14 mg Transdermal Daily  . potassium chloride  10 mEq Oral Daily  . thiamine  100 mg Oral Daily   Continuous Infusions:   Assessment/Plan:  1. Alcohol withdrawal and alcohol abuse.  On alcohol withdrawal protocol.  I decreased Ativan to 1 mg. 2. 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  Continue trial of steroids.  Physical therapy evaluation.  Out of bed to chair.  PRN oxycodone. 3. Essential hypertension.  Restart Cardizem and metoprolol. 4. Paroxysmal atrial fibrillation.  Started on amiodarone.  Continue Cardizem metoprolol and aspirin. 5. History of cardiomyopathy with ejection fraction of 40%.  6. Depression on Celexa  Code Status:     Code Status Orders  (From admission, onward)        Start     Ordered   10/27/17 1521  Full code  Continuous     10/27/17 1521    Code Status History    Date Active Date Inactive Code Status Order ID Comments User Context   10/21/2017 1945 10/23/2017 1833 Full Code [id number]  Auburn Bilberry, MD Inpatient   10/21/2017 1504 10/21/2017 1945 Full Code [id number]  Arnaldo Natal, MD ED   10/10/2017 1314 10/11/2017 1147 Full Code [id number]  Willy Eddy, MD ED   10/05/2017 2214 10/07/2017 2101 Full Code [id number]  Arnaldo Natal, MD ED   10/05/2017 1924 10/05/2017 2214 Full Code [id number]  Arnaldo Natal, MD ED   10/01/2017 0807 10/04/2017 1650 Full Code [id number]  Barbaraann Rondo, MD Inpatient   08/24/2017 1756 08/27/2017 1849 Full Code [id number]  Enid Baas, MD ED   08/14/2017 0030 08/18/2017 1605 Full Code [id number]  Oralia Manis, MD Inpatient   06/04/2017 0116 06/06/2017 1701 Full Code [id number]  Oralia Manis, MD Inpatient   03/19/2017 1559 03/20/2017 2128 Full Code [id number]  Arnaldo Natal, MD ED   02/25/2017 1421 03/01/2017 1829 Full Code [id number]  Houston Siren, MD Inpatient   11/12/2016 1533 11/14/2016 1425 Full Code [id number]  Marguarite Arbour, MD Inpatient    12/26/2015 1927 12/30/2015 1604 Full Code [id number]  Marguarite Arbour, MD Inpatient      Disposition Plan: Unclear at this point  Time spent: 26 minutes  Ajmal Kathan [employer]

## 2017-10-31 NOTE — Progress Notes (Signed)
Physical Therapy Treatment Patient Details Name: Kurt Horton MRN: [id number] DOB: 05-03-1961 Today's Date: 10/31/2017    History of Present Illness pt presented to ED via EMS on 10/27/17 with symptoms consistent with being intoxicated. Pt admitted for alcohol withdrawl. Pt has a past medical history that includes a-fib, alcohol abuse, DM, DJD, HTN, and renal disorder    PT Comments    Pt seen this morning and states that he is doing well. Pt states that he is very motivated to get into alcohol rehab and get back in shape. Pts withdrawal symptoms less compared to previous session, pt does not have tremors at this time. Pt instructed in there-ex which he tolerates well. Pts strength and ROM appears to be at baseline at this time. Pt amb 200' with CGA using RW. PT encouraged nursing to continue mobility efforts with patient. Pt states that he has 8/10 low back pain at this time however states that it is never better than a 7/10. Pt appears to be physically at his baseline at this time. PT plans to D/c in house at this time with d/c recommendation continuing to be outpatient PT once alcohol rehab program is complete to improve strength and balance.   Follow Up Recommendations  Other (comment)(Outpatient PT once alcohol rehab program complete)     Equipment Recommendations  None recommended by PT    Recommendations for Other Services       Precautions / Restrictions Precautions Precautions: None Restrictions Weight Bearing Restrictions: No    Mobility  Bed Mobility Overal bed mobility: Independent             General bed mobility comments: pt performs bed mobility independently in an appropriate amount of time  Transfers Overall transfer level: Needs assistance Equipment used: Rolling walker (2 wheeled) Transfers: Sit to/from Stand Sit to Stand: Min guard         General transfer comment: pt CGA sit<>stand at RW including cuing for safe hand placement and  sequencing  Ambulation/Gait Ambulation/Gait assistance: Min guard Gait Distance (Feet): 200 Feet Assistive device: Rolling walker (2 wheeled) Gait Pattern/deviations: Step-through pattern Gait velocity: 10' in 10 seconds   General Gait Details: pt amb 200 with CGA and RW. Pt demonstrates adequate gait pattern and speed. Continuous verbal cuing for upright posture.   Stairs             Wheelchair Mobility    Modified Rankin (Stroke Patients Only)       Balance                                            Cognition Arousal/Alertness: Awake/alert Behavior During Therapy: WFL for tasks assessed/performed Overall Cognitive Status: Within Functional Limits for tasks assessed                                        Exercises Other Exercises Other Exercises: pt instructed in B ther-ex x12 reps each including SLR, LAQ, sidelying hip abd, glut bridges, standing marching and standing unsupported reaching outside BOS.     General Comments        Pertinent Vitals/Pain Pain Assessment: 0-10 Pain Score: 8  Pain Location: back Pain Descriptors / Indicators: Aching Pain Intervention(s): Limited activity within patient's tolerance;Monitored during session;Premedicated before session;Repositioned  Home Living                      Prior Function            PT Goals (current goals can now be found in the care plan section) Acute Rehab PT Goals Patient Stated Goal: stop walking with the cane, get back in shape PT Goal Formulation: With patient Time For Goal Achievement: 11/12/17 Potential to Achieve Goals: Fair Progress towards PT goals: Progressing toward goals    Frequency    Min 2X/week      PT Plan Current plan remains appropriate    Co-evaluation              AM-PAC PT "6 Clicks" Daily Activity  Outcome Measure  Difficulty turning over in bed (including adjusting bedclothes, sheets and blankets)?:  None Difficulty moving from lying on back to sitting on the side of the bed? : None Difficulty sitting down on and standing up from a chair with arms (e.g., wheelchair, bedside commode, etc,.)?: Unable Help needed moving to and from a bed to chair (including a wheelchair)?: None Help needed walking in hospital room?: None Help needed climbing 3-5 steps with a railing? : A Little 6 Click Score: 20    End of Session Equipment Utilized During Treatment: Gait belt Activity Tolerance: Patient tolerated treatment well;Patient limited by fatigue Patient left: in chair;with call bell/phone within reach;with chair alarm set Nurse Communication: Mobility status PT Visit Diagnosis: Unsteadiness on feet (R26.81);Repeated falls (R29.6);Muscle weakness (generalized) (M62.81);History of falling (Z91.81);Difficulty in walking, not elsewhere classified (R26.2);Pain Pain - Right/Left: (low back) Pain - part of body: (back)     Time: 1610-9604 PT Time Calculation (min) (ACUTE ONLY): 26 min  Charges:                       G Codes:       Kurt Horton, SPT    Kurt Horton 10/31/2017, 12:22 PM

## 2017-11-01 LAB — GLUCOSE, CAPILLARY
GLUCOSE-CAPILLARY: 151 mg/dL — AB (ref 70–99)
GLUCOSE-CAPILLARY: 88 mg/dL (ref 70–99)
Glucose-Capillary: 183 mg/dL — ABNORMAL HIGH (ref 70–99)
Glucose-Capillary: 214 mg/dL — ABNORMAL HIGH (ref 70–99)

## 2017-11-01 MED ORDER — LIDOCAINE 5 % EX PTCH: 1.0000 | MEDICATED_PATCH | CUTANEOUS | 0 refills | Status: DC

## 2017-11-01 MED ORDER — THIAMINE HCL 100 MG PO TABS: 100.0000 mg | ORAL_TABLET | Freq: Every day | ORAL | 0 refills | Status: DC

## 2017-11-01 MED ORDER — PREDNISONE 20 MG PO TABS
30.0000 mg | ORAL_TABLET | Freq: Every day | ORAL | Status: DC
  Administered 2017-11-02: 09:00:00 30 mg via ORAL
  Filled 2017-11-01: qty 1

## 2017-11-01 MED ORDER — NICOTINE 14 MG/24HR TD PT24: 14.0000 mg | MEDICATED_PATCH | Freq: Every day | TRANSDERMAL | 0 refills | Status: DC

## 2017-11-01 MED ORDER — LORAZEPAM 1 MG PO TABS
1.0000 mg | ORAL_TABLET | ORAL | Status: DC | PRN
  Administered 2017-11-01: 1 mg via ORAL
  Filled 2017-11-01: qty 1

## 2017-11-01 MED ORDER — LORAZEPAM 1 MG PO TABS
1.0000 mg | ORAL_TABLET | Freq: Four times a day (QID) | ORAL | Status: DC | PRN
  Administered 2017-11-01 – 2017-11-02 (×3): 1 mg via ORAL
  Filled 2017-11-01 (×3): qty 1

## 2017-11-01 MED ORDER — ACETAMINOPHEN 325 MG PO TABS: 650.0000 mg | ORAL_TABLET | Freq: Four times a day (QID) | ORAL | Status: DC | PRN

## 2017-11-01 MED ORDER — PREDNISONE 10 MG PO TABS: ORAL_TABLET | ORAL | 0 refills | Status: DC

## 2017-11-01 NOTE — Clinical Social Work Note (Signed)
[facility]. [facility]. [facility]. CSW will continue to follow   Ruthe MannanCandace Genessis Flanary MSW, [place] [phone]

## 2017-11-01 NOTE — Discharge Summary (Addendum)
[facility] - [place] [facility]   PATIENT NAME: Kurt Horton    MR#:  [id number]  DATE OF BIRTH:  08-09-1960  DATE OF ADMISSION:  10/27/2017 ADMITTING PHYSICIAN: Milagros Loll, MD  DATE OF DISCHARGE: 11/02/2017  PRIMARY CARE PHYSICIAN: [name], MD    ADMISSION DIAGNOSIS:  Alcohol withdrawal syndrome without complication (HCC) [F10.230]  DISCHARGE DIAGNOSIS:  Active Problems:   Alcohol withdrawal (HCC)   SECONDARY DIAGNOSIS:   Past Medical History:  Diagnosis Date  . A-fib (HCC)   . Alcohol abuse   . Alcohol withdrawal (HCC) 11/12/2016  . Diabetes mellitus without complication (HCC)   . DJD (degenerative joint disease)   . Hypertension   . Myasthenia gravis (HCC)   . Renal disorder     HOSPITAL COURSE:   1.  Alcohol withdrawal and alcohol abuse.  Patient seems better today.  He was actually standing up combing his hair when I saw him today.  Patient is working on trying to get into a rehab program.  Ativan switched over to oral today.  Spread out the dose.  If he gets into a program I would not give him Ativan to go home with. 2.  Severe back pain with herniated disc.  Patient improved with trial of steroids.  Switch steroids over to oral.  If he gets into a program I will not prescribe any pain medications for him. 3.  Essential hypertension restart Cardizem and metoprolol 4.  Paroxysmal atrial fibrillation on Cardizem, metoprolol and amiodarone and aspirin. 5.  History of cardiomyopathy with ejection fraction of 40% 6.  Depression on Celexa  DISCHARGE CONDITIONS:   Fair  CONSULTS OBTAINED:  None  DRUG ALLERGIES:   Allergies  Allergen Reactions  . Nsaids Other (See Comments)    Duodenal ulcers, GI bleeding  . Other Other (See Comments)    Patient states he can't take any "mycin" medications because myasthenia gravis.    DISCHARGE MEDICATIONS:   Allergies as of 11/02/2017      Reactions   Nsaids Other (See Comments)    Duodenal ulcers, GI bleeding   Other Other (See Comments)   Patient states he can't take any "mycin" medications because myasthenia gravis.      Medication List    STOP taking these medications   chlordiazePOXIDE 10 MG capsule Commonly known as:  LIBRIUM   furosemide 40 MG tablet Commonly known as:  LASIX   potassium chloride 10 MEQ tablet Commonly known as:  K-DUR     TAKE these medications   acetaminophen 325 MG tablet Commonly known as:  TYLENOL Take 2 tablets (650 mg total) by mouth every 6 (six) hours as needed for mild pain (or Fever >/= 101).   amiodarone 200 MG tablet Commonly known as:  PACERONE Take 1 tablet (200 mg total) by mouth daily.   aspirin 325 MG EC tablet Take 1 tablet (325 mg total) by mouth daily.   B-complex with vitamin C tablet Take 1 tablet by mouth daily.   citalopram 20 MG tablet Commonly known as:  CELEXA Take 1 tablet (20 mg total) by mouth daily.   diltiazem 180 MG 24 hr capsule Commonly known as:  CARDIZEM CD Take 1 capsule (180 mg total) by mouth daily.   gabapentin 300 MG capsule Commonly known as:  NEURONTIN Take 2 capsules (600 mg total) by mouth 3 (three) times daily.   lidocaine 5 % Commonly known as:  LIDODERM Place 1 patch onto the skin daily. Remove &  Discard patch within 12 hours or as directed by MD   metFORMIN 500 MG tablet Commonly known as:  GLUCOPHAGE Take 1 tablet (500 mg total) by mouth daily.   metoprolol succinate 50 MG 24 hr tablet Commonly known as:  TOPROL-XL Take 1 tablet (50 mg total) by mouth daily. Take with or immediately following a meal.   multivitamin Tabs tablet Take 1 tablet by mouth daily.   mycophenolate 500 MG tablet Commonly known as:  CELLCEPT Take 1 tablet (500 mg total) by mouth 2 (two) times daily. Take 1 tablet (500MG ) by mouth every morning and 2 tablets (1000MG ) by mouth every evening   nicotine 14 mg/24hr patch Commonly known as:  NICODERM CQ - dosed in mg/24 hours Place 1  patch (14 mg total) onto the skin daily. Okay to substitute generic   predniSONE 10 MG tablet Commonly known as:  DELTASONE 2 tabs po day1; 1 tab po day2; 1/2 tab po day3,4   protein supplement shake Liqd Commonly known as:  PREMIER PROTEIN Take 325 mLs (11 oz total) by mouth 2 (two) times daily between meals.   thiamine 100 MG tablet Take 1 tablet (100 mg total) by mouth daily.        DISCHARGE INSTRUCTIONS:   Follow-up with PMD 6 days Hopefully can get into a alcohol rehab program  If you experience worsening of your admission symptoms, develop shortness of breath, life threatening emergency, suicidal or homicidal thoughts you must seek medical attention immediately by calling 911 or calling your MD immediately  if symptoms less severe.  You Must read complete instructions/literature along with all the possible adverse reactions/side effects for all the Medicines you take and that have been prescribed to you. Take any new Medicines after you have completely understood and accept all the possible adverse reactions/side effects.   Please note  You were cared for by a hospitalist during your hospital stay. If you have any questions about your discharge medications or the care you received while you were in the hospital after you are discharged, you can call the unit and asked to speak with the hospitalist on call if the hospitalist that took care of you is not available. Once you are discharged, your primary care physician will handle any further medical issues. Please note that NO REFILLS for any discharge medications will be authorized once you are discharged, as it is imperative that you return to your primary care physician (or establish a relationship with a primary care physician if you do not have one) for your aftercare needs so that they can reassess your need for medications and monitor your lab values.    Today   CHIEF COMPLAINT:   Chief Complaint  Patient presents with   . Alcohol Problem    HISTORY OF PRESENT ILLNESS:  Kurt Horton  is a 57 y.o. male with a known history of alcohol abuse.  Presents back to the hospital   VITAL SIGNS:  Blood pressure 109/73, pulse 66, temperature 98.5 F (36.9 C), temperature source Oral, resp. rate 18, height 5\' 11"  (1.803 m), weight 85.1 kg (187 lb 9.6 oz), SpO2 99 %.   PHYSICAL EXAMINATION:  GENERAL:  57 y.o.-year-old patient lying in the bed with no acute distress.  EYES: Pupils equal, round, reactive to light and accommodation. No scleral icterus. Extraocular muscles intact.  HEENT: Head atraumatic, normocephalic. Oropharynx and nasopharynx clear.  NECK:  Supple, no jugular venous distention. No thyroid enlargement, no tenderness.  LUNGS: Normal breath sounds  bilaterally, no wheezing, rales,rhonchi or crepitation. No use of accessory muscles of respiration.  CARDIOVASCULAR: S1, S2 normal. No murmurs, rubs, or gallops.  ABDOMEN: Soft, non-tender, non-distended. Bowel sounds present. No organomegaly or mass.  EXTREMITIES: No pedal edema, cyanosis, or clubbing.  NEUROLOGIC: Cranial nerves II through XII are intact. Muscle strength 5/5 in all extremities. Sensation intact. Gait not checked.  Slight tremor. PSYCHIATRIC: The patient is alert and oriented x 3.  SKIN: No obvious rash, lesion, or ulcer.   DATA REVIEW:   CBC Recent Labs  Lab 10/28/17 0359  WBC 5.3  HGB 11.5*  HCT 36.0*  PLT 212    Chemistries  Recent Labs  Lab 10/28/17 0359  NA 136  K 4.1  CL 104  CO2 25  GLUCOSE 166*  BUN 7  CREATININE 0.72  CALCIUM 9.1  AST 24  ALT 15*  ALKPHOS 71  BILITOT 0.9    Microbiology Results  Results for orders placed or performed during the hospital encounter of 08/13/17  C difficile quick scan w PCR reflex     Status: None   Collection Time: 08/14/17  5:41 PM  Result Value Ref Range Status   C Diff antigen NEGATIVE NEGATIVE Final   C Diff toxin NEGATIVE NEGATIVE Final   C Diff interpretation No  C. difficile detected.  Final    Comment: VALID Performed at [facility], [address] [place], [place] [address]       Management plans discussed with the patient,  and he is in agreement.  CODE STATUS:     Code Status Orders  (From admission, onward)        Start     Ordered   10/27/17 1521  Full code  Continuous     10/27/17 1521    Code Status History    Date Active Date Inactive Code Status Order ID Comments User Context   10/21/2017 1945 10/23/2017 1833 Full Code [id number]  Auburn Bilberry, MD Inpatient   10/21/2017 1504 10/21/2017 1945 Full Code [id number]  Arnaldo Natal, MD ED   10/10/2017 1314 10/11/2017 1147 Full Code [id number]  Willy Eddy, MD ED   10/05/2017 2214 10/07/2017 2101 Full Code [id number]  Arnaldo Natal, MD ED   10/05/2017 1924 10/05/2017 2214 Full Code [id number]  Arnaldo Natal, MD ED   10/01/2017 0807 10/04/2017 1650 Full Code [id number]  Barbaraann Rondo, MD Inpatient   08/24/2017 1756 08/27/2017 1849 Full Code [id number]  Enid Baas, MD ED   08/14/2017 0030 08/18/2017 1605 Full Code [id number]  Oralia Manis, MD Inpatient   06/04/2017 0116 06/06/2017 1701 Full Code [id number]  Oralia Manis, MD Inpatient   03/19/2017 1559 03/20/2017 2128 Full Code [id number]  Arnaldo Natal, MD ED   02/25/2017 1421 03/01/2017 1829 Full Code [id number]  Houston Siren, MD Inpatient   11/12/2016 1533 11/14/2016 1425 Full Code [id number]  Marguarite Arbour, MD Inpatient   12/26/2015 1927 12/30/2015 1604 Full Code [id number]  Marguarite Arbour, MD Inpatient      TOTAL TIME TAKING CARE OF THIS PATIENT: 35 minutes.    Alford Highland M.D on 11/02/2017 at 9:07 AM  Between 7am to 6pm - Pager - [phone]  After 6pm go to [email or web address] - password [email or web address]  [facility] Office  [phone]  CC: Primary care physician; [name], MD

## 2017-11-01 NOTE — Progress Notes (Signed)
Patient ID: Kurt Horton, male   DOB: 12-18-1960, 57 y.o.   MRN: [id number]   [facility] PROGRESS NOTE  Kurt Horton WUJ:[id number] DOB: [birth year] DOA: 10/27/2017 PCP: [name], MD  HPI/Subjective: Patient still having back pain.  Patient feeling a little bit better.  I saw him while he was standing up combing his hair.  Objective: Vitals:   11/01/17 0515 11/01/17 0859  BP: 120/72 (!) 143/78  Pulse: 63 68  Resp: 16 20  Temp: 98.4 F (36.9 C) 98.6 F (37 C)  SpO2: 96% 97%    Filed Weights   10/30/17 0356 10/31/17 0507 11/01/17 0518  Weight: 83.8 kg (184 lb 11.2 oz) 84.8 kg (187 lb) 85.1 kg (187 lb 9.6 oz)    ROS: Review of Systems  Constitutional: Negative for chills and fever.  Eyes: Negative for blurred vision.  Respiratory: Negative for cough and shortness of breath.   Cardiovascular: Negative for chest pain.  Gastrointestinal: Negative for abdominal pain, constipation, diarrhea, nausea and vomiting.  Genitourinary: Negative for dysuria.  Musculoskeletal: Positive for back pain. Negative for joint pain.  Neurological: Positive for tremors. Negative for dizziness and headaches.   Exam: Physical Exam  Constitutional: He is oriented to person, place, and time.  HENT:  Nose: No mucosal edema.  Mouth/Throat: No oropharyngeal exudate or posterior oropharyngeal edema.  Eyes: Pupils are equal, round, and reactive to light. Conjunctivae, EOM and lids are normal.  Neck: No JVD present. Carotid bruit is not present. No edema present. No thyroid mass and no thyromegaly present.  Cardiovascular: S1 normal and S2 normal. Exam reveals no gallop.  No murmur heard. Pulses:      Dorsalis pedis pulses are 2+ on the right side, and 2+ on the left side.  Respiratory: No respiratory distress. He has no wheezes. He has no rhonchi. He has no rales.  GI: Soft. Bowel sounds are normal. There is no tenderness.  Musculoskeletal:       Right ankle: He exhibits no swelling.        Left ankle: He exhibits no swelling.  Lymphadenopathy:    He has no cervical adenopathy.  Neurological: He is alert and oriented to person, place, and time. No cranial nerve deficit.  Right upper extremity tremor.  No flapping asterixis.  Patient able to straight leg raise 20 degrees on his own.  Skin: Skin is warm. No rash noted. Nails show no clubbing.  Psychiatric: He has a normal mood and affect.      Data Reviewed: Basic Metabolic Panel: Recent Labs  Lab 10/27/17 0230 10/28/17 0359  NA 143 136  K 3.2* 4.1  CL 105 104  CO2 25 25  GLUCOSE 148* 166*  BUN 6 7  CREATININE 0.77 0.72  CALCIUM 8.3* 9.1   Liver Function Tests: Recent Labs  Lab 10/27/17 0230 10/28/17 0359  AST 23 24  ALT 16* 15*  ALKPHOS 71 71  BILITOT 0.5 0.9  PROT 6.7 6.7  ALBUMIN 3.7 3.9   Recent Labs  Lab 10/27/17 0230  LIPASE 35   CBC: Recent Labs  Lab 10/27/17 0230 10/28/17 0359  WBC 4.7 5.3  NEUTROABS 1.9  --   HGB 12.0* 11.5*  HCT 36.8* 36.0*  MCV 80.0 80.6  PLT 237 212   BNP (last 3 results) Recent Labs    08/14/17 0112  BNP 65.0     CBG: Recent Labs  Lab 10/31/17 1205 10/31/17 1727 10/31/17 2102 11/01/17 0747 11/01/17 1149  GLUCAP 127* 181*  233* 183* 151*      Scheduled Meds: . amiodarone  200 mg Oral Daily  . aspirin  325 mg Oral Daily  . citalopram  20 mg Oral Daily  . diltiazem  180 mg Oral Daily  . enoxaparin (LOVENOX) injection  40 mg Subcutaneous Q24H  . folic acid  1 mg Oral Daily  . gabapentin  600 mg Oral TID  . insulin aspart  0-15 Units Subcutaneous TID WC  . insulin aspart  0-5 Units Subcutaneous QHS  . lidocaine  1 patch Transdermal Q24H  . metoprolol succinate  50 mg Oral Daily  . multivitamin with minerals  1 tablet Oral Daily  . mycophenolate  1,000 mg Oral QHS  . mycophenolate  500 mg Oral Q0600  . nicotine  14 mg Transdermal Daily  . potassium chloride  10 mEq Oral Daily  . [START ON 11/02/2017] predniSONE  30 mg Oral Q breakfast   . thiamine  100 mg Oral Daily   Continuous Infusions:   Assessment/Plan:  1. Alcohol withdrawal and alcohol abuse.  On alcohol withdrawal protocol.  Ativan switched over to oral and spread out the frequency. We talked about going home today.  Patient wanted to give it another day.  He is hoping to set up an outpatient rehab program where he can go directly there. 2. 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  Steroid taper. 3. Essential hypertension.  Cardizem and metoprolol. 4. Paroxysmal atrial fibrillation.  Continue Cardizem metoprolol and aspirin. 5. History of cardiomyopathy with ejection fraction of 40%.  6. Depression on Celexa  Code Status:     Code Status Orders  (From admission, onward)        Start     Ordered   10/27/17 1521  Full code  Continuous     10/27/17 1521    Code Status History    Date Active Date Inactive Code Status Order ID Comments User Context   10/21/2017 1945 10/23/2017 1833 Full Code [id number]  Auburn BilberryPatel, Shreyang, MD Inpatient   10/21/2017 1504 10/21/2017 1945 Full Code [id number]  Arnaldo NatalMalinda, Paul F, MD ED   10/10/2017 1314 10/11/2017 1147 Full Code [id number]  Willy Eddyobinson, Patrick, MD ED   10/05/2017 2214 10/07/2017 2101 Full Code [id number]  Arnaldo NatalMalinda, Paul F, MD ED   10/05/2017 1924 10/05/2017 2214 Full Code [id number]  Arnaldo NatalMalinda, Paul F, MD ED   10/01/2017 0807 March 11, 202019 1650 Full Code [id number]  Barbaraann RondoSridharan, Prasanna, MD Inpatient   08/24/2017 1756 08/27/2017 1849 Full Code [id number]  Enid BaasKalisetti, Radhika, MD ED   08/14/2017 0030 08/18/2017 1605 Full Code [id number]  Oralia ManisWillis, David, MD Inpatient   06/04/2017 0116 06/06/2017 1701 Full Code [id number]  Oralia ManisWillis, David, MD Inpatient   03/19/2017 1559 03/20/2017 2128 Full Code [id number]  Arnaldo NatalMalinda, Paul F, MD ED   02/25/2017 1421 03/01/2017 1829 Full Code [id number]  Houston SirenSainani, Vivek J, MD Inpatient   11/12/2016 1533 11/14/2016 1425 Full Code [id number]  Marguarite ArbourSparks, Jeffrey D, MD Inpatient   12/26/2015 1927 12/30/2015 1604 Full Code  [id number]  Marguarite ArbourSparks, Jeffrey D, MD Inpatient      Disposition Plan:  hopefully discharge tomorrow.  Hopefully will be able to go to a alcohol rehab program.  Time spent: 32 minutes  Izadora Roehr [employer]  [facility]

## 2017-11-02 LAB — GLUCOSE, CAPILLARY: Glucose-Capillary: 96 mg/dL (ref 70–99)

## 2017-11-02 MED ORDER — B COMPLEX-C PO TABS
1.0000 | ORAL_TABLET | Freq: Every day | ORAL | 0 refills | Status: DC
Start: 2017-11-02 — End: 2018-08-28

## 2017-11-02 MED ORDER — NICOTINE 14 MG/24HR TD PT24: 14.0000 mg | MEDICATED_PATCH | Freq: Every day | TRANSDERMAL | 0 refills | Status: DC

## 2017-11-02 MED ORDER — METOPROLOL SUCCINATE ER 50 MG PO TB24: 50.0000 mg | ORAL_TABLET | Freq: Every day | ORAL | 0 refills | Status: DC

## 2017-11-02 MED ORDER — METFORMIN HCL 500 MG PO TABS: 500.0000 mg | ORAL_TABLET | Freq: Every day | ORAL | 0 refills | Status: DC

## 2017-11-02 MED ORDER — MYCOPHENOLATE MOFETIL 500 MG PO TABS: 500.0000 mg | ORAL_TABLET | Freq: Two times a day (BID) | ORAL | 0 refills | Status: DC

## 2017-11-02 MED ORDER — AMIODARONE HCL 200 MG PO TABS: 200.0000 mg | ORAL_TABLET | Freq: Every day | ORAL | 0 refills | Status: DC

## 2017-11-02 MED ORDER — DILTIAZEM HCL ER COATED BEADS 180 MG PO CP24: 180.0000 mg | ORAL_CAPSULE | Freq: Every day | ORAL | 0 refills | Status: DC

## 2017-11-02 MED ORDER — ASPIRIN 325 MG PO TBEC: 325.0000 mg | DELAYED_RELEASE_TABLET | Freq: Every day | ORAL | 0 refills | Status: DC

## 2017-11-02 MED ORDER — GABAPENTIN 300 MG PO CAPS: 600.0000 mg | ORAL_CAPSULE | Freq: Three times a day (TID) | ORAL | 0 refills | Status: DC

## 2017-11-02 MED ORDER — ONE-A-DAY MENS PO TABS: 1.0000 | ORAL_TABLET | Freq: Every day | ORAL | 0 refills | Status: DC

## 2017-11-02 MED ORDER — PREMIER PROTEIN SHAKE: 11.0000 [oz_av] | Freq: Two times a day (BID) | ORAL | 0 refills | Status: AC

## 2017-11-02 MED ORDER — PREDNISONE 10 MG PO TABS: ORAL_TABLET | ORAL | 0 refills | Status: DC

## 2017-11-02 MED ORDER — ACETAMINOPHEN 325 MG PO TABS: 650.0000 mg | ORAL_TABLET | Freq: Four times a day (QID) | ORAL | 0 refills | Status: DC | PRN

## 2017-11-02 MED ORDER — LIDOCAINE 5 % EX PTCH: 1.0000 | MEDICATED_PATCH | CUTANEOUS | 0 refills | Status: DC

## 2017-11-02 MED ORDER — CITALOPRAM HYDROBROMIDE 20 MG PO TABS: 20.0000 mg | ORAL_TABLET | Freq: Every day | ORAL | 0 refills | Status: DC

## 2017-11-02 MED ORDER — OXYCODONE HCL 5 MG PO TABS
5.0000 mg | ORAL_TABLET | Freq: Once | ORAL | Status: AC
  Administered 2017-11-02: 10:00:00 5 mg via ORAL
  Filled 2017-11-02: qty 1

## 2017-11-02 NOTE — Progress Notes (Signed)
   11/02/17 1100  Clinical Encounter Type  Visited With Patient and family together  Visit Type Follow-up  Referral From Nurse  Consult/Referral To Chaplain  Spiritual Encounters  Spiritual Needs Emotional;Grief support  Stress Factors  Patient Stress Factors Family relationships   CH followed-up with patient after visiting with him earlier this week. CH inquired about pt's pending discharge and [place]. CH maintained pastoral presence as pt got dressed and gathered belongings for discharge.  Pt was receptive to our visit and told me he appreciated me checking in on him and coming to visit.  After patient was dressed and packed, pt settled down and started to be more intentional with our conversation.  We talked about his daughter and son again and how they are the reason he is going to rehab, to continue improving his relationship with them.  He also mentioned his ex-wife and his continued processing and grief over the end of their 25 yr marriage.  Pt was emotional and almost teary.  Pt shared that he was looking forward to rehab and the structure it provides.  Talked highly of being in class and group and looking forward to education and being able to hold intelligent conversations with others. CH waited with patient until his aunt arrived to help with escorted discharge. Before leaving, pt asked for a hug and CH told pt I would continue to pray for him.

## 2017-11-02 NOTE — Discharge Instructions (Signed)
Alcohol Withdrawal When a person who drinks a lot of alcohol stops drinking, he or she may go through alcohol withdrawal. Alcohol withdrawal causes problems. It can make you feel:  Tired (fatigued).  Sad (depressed).  Fearful (anxious).  Grouchy (irritable).  Not hungry.  Sick to your stomach (nauseous).  Shaky.  It can also make you have:  Nightmares.  Trouble sleeping.  Trouble thinking clearly.  Mood swings.  Clammy skin.  Very bad sweating.  A very fast heartbeat.  Shaking that you cannot control (tremor).  Having a fever.  A fit of movements that you cannot control (seizure).  Confusion.  Throwing up (vomiting).  Feeling or seeing things that are not there (hallucinations).  Follow these instructions at home:  Take medicines and vitamins only as told by your doctor.  Do not drink alcohol.  Have someone around in case you need help.  Drink enough fluid to keep your pee (urine) clear or pale yellow.  Think about joining a group to help you stop drinking. Contact a doctor if:  Your problems get worse.  Your problems do not go away.  You cannot eat or drink without throwing up.  You are having a hard time not drinking alcohol.  You cannot stop drinking alcohol. Get help right away if:  You feel your heart beating differently than usual.  Your chest hurts.  You have trouble breathing.  You have very bad problems, like: ? A fever. ? A fit of movements that you cannot control. ? Being very confused. ? Feeling or seeing things that are not there. This information is not intended to replace advice given to you by your health care provider. Make sure you discuss any questions you have with your health care provider. Document Released: 10/11/2007 Document Revised: 09/30/2015 Document Reviewed: 02/10/2014 Elsevier Interactive Patient Education  2018 [employer].  Alcohol Abuse and Nutrition Alcohol abuse is any pattern of alcohol  consumption that harms your health, relationships, or work. Alcohol abuse can affect how your body breaks down and absorbs nutrients from food by causing your liver to work abnormally. Additionally, many people who abuse alcohol do not eat enough carbohydrates, protein, fat, vitamins, and minerals. This can cause poor nutrition (malnutrition) and a lack of nutrients (nutrient deficiencies), which can lead to further complications. Nutrients that are commonly lacking (deficient) among people who abuse alcohol include:  Vitamins. ? Vitamin A. This is stored in your liver. It is important for your vision, metabolism, and ability to fight off infections (immunity). ? B vitamins. These include vitamins such as folate, thiamin, and niacin. These are important in new cell growth and maintenance. ? Vitamin C. This plays an important role in iron absorption, wound healing, and immunity. ? Vitamin D. This is produced by your liver, but you can also get vitamin D from food. Vitamin D is necessary for your body to absorb and use calcium.  Minerals. ? Calcium. This is important for your bones and your heart and blood vessel (cardiovascular) function. ? Iron. This is important for blood, muscle, and nervous system functioning. ? Magnesium. This plays an important role in muscle and nerve function, and it helps to control blood sugar and blood pressure. ? Zinc. This is important for the normal function of your nervous system and digestive system (gastrointestinal tract).  Nutrition is an essential component of therapy for alcohol abuse. Your health care provider or dietitian will work with you to design a plan that can help restore nutrients to your  body and prevent potential complications. What is my plan? Your dietitian may develop a specific diet plan that is based on your condition and any other complications you may have. A diet plan will commonly include:  A balanced diet. ? Grains: 6-8 oz per  day. ? Vegetables: 2-3 cups per day. ? Fruits: 1-2 cups per day. ? Meat and other protein: 5-6 oz per day. ? Dairy: 2-3 cups per day.  Vitamin and mineral supplements.  What do I need to know about alcohol and nutrition?  Consume foods that are high in antioxidants, such as grapes, berries, nuts, green tea, and dark green and orange vegetables. This can help to counteract some of the stress that is placed on your liver by consuming alcohol.  Avoid food and drinks that are high in fat and sugar. Foods such as sugared soft drinks, salty snack foods, and candy contain empty calories. This means that they lack important nutrients such as protein, fiber, and vitamins.  Eat frequent meals and snacks. Try to eat 5-6 small meals each day.  Eat a variety of fresh fruits and vegetables each day. This will help you get plenty of water, fiber, and vitamins in your diet.  Drink plenty of water and other clear fluids. Try to drink at least 48-64 oz (1.5-2 L) of water per day.  If you are a vegetarian, eat a variety of protein-rich foods. Pair whole grains with plant-based proteins at meals and snacks to obtain the greatest nutrient benefit from your food. For example, eat rice with beans, put peanut butter on whole-grain toast, or eat oatmeal with sunflower seeds.  Soak beans and whole grains overnight before cooking. This can help your body to absorb the nutrients more easily.  Include foods fortified with vitamins and minerals in your diet. Commonly fortified foods include milk, orange juice, cereal, and bread.  If you are malnourished, your dietitian may recommend a high-protein, high-calorie diet. This may include: ? 2,000-3,000 calories (kilocalories) per day. ? 70-100 grams of protein per day.  Your health care provider may recommend a complete nutritional supplement beverage. This can help to restore calories, protein, and vitamins to your body. Depending on your condition, you may be advised  to consume this instead of or in addition to meals.  Limit your intake of caffeine. Replace drinks like coffee and black tea with decaffeinated coffee and herbal tea.  Eat a variety of foods that are high in omega fatty acids. These include fish, nuts and seeds, and soybeans. These foods may help your liver to recover and may also stabilize your mood.  Certain medicines may cause changes in your appetite, taste, and weight. Work with your health care provider and dietitian to make any adjustments to your medicines and diet plan.  Include other healthy lifestyle choices in your daily routine. ? Be physically active. ? Get enough sleep. ? Spend time doing activities that you enjoy.  If you are unable to take in enough food and calories by mouth, your health care provider may recommend a feeding tube. This is a tube that passes through your nose and throat, directly into your stomach. Nutritional supplement beverages can be given to you through the feeding tube to help you get the nutrients you need.  Take vitamin or mineral supplements as recommended by your health care provider. What foods can I eat? Grains Enriched pasta. Enriched rice. Fortified whole-grain bread. Fortified whole-grain cereal. Barley. Brown rice. Quinoa. Millet. Vegetables All fresh, frozen, and canned vegetables.  Spinach. Kale. Artichoke. Carrots. Winter squash and pumpkin. Sweet potatoes. Broccoli. Cabbage. Cucumbers. Tomatoes. Sweet peppers. Green beans. Peas. Corn. Fruits All fresh and frozen fruits. Berries. Grapes. Mango. Papaya. Guava. Cherries. Apples. Bananas. Peaches. Plums. Pineapple. Watermelon. Cantaloupe. Oranges. Avocado. Meats and Other Protein Sources Beef liver. Lean beef. Pork. Fresh and canned chicken. Fresh fish. Oysters. Sardines. Canned tuna. Shrimp. Eggs with yolks. Nuts and seeds. Peanut butter. Beans and lentils. Soybeans. Tofu. Dairy Whole, low-fat, and nonfat milk. Whole, low-fat, and nonfat  yogurt. Cottage cheese. Sour cream. Hard and soft cheeses. Beverages Water. Herbal tea. Decaffeinated coffee. Decaffeinated green tea. 100% fruit juice. 100% vegetable juice. Instant breakfast shakes. Condiments Ketchup. Mayonnaise. Mustard. Salad dressing. Barbecue sauce. Sweets and Desserts Sugar-free ice cream. Sugar-free pudding. Sugar-free gelatin. Fats and Oils Butter. Vegetable oil, flaxseed oil, olive oil, and walnut oil. Other Complete nutrition shakes. Protein bars. Sugar-free gum. The items listed above may not be a complete list of recommended foods or beverages. Contact your dietitian for more options. What foods are not recommended? Grains Sugar-sweetened breakfast cereals. Flavored instant oatmeal. Fried breads. Vegetables Breaded or deep-fried vegetables. Fruits Dried fruit with added sugar. Candied fruit. Canned fruit in syrup. Meats and Other Protein Sources Breaded or deep-fried meats. Dairy Flavored milks. Fried cheese curds or fried cheese sticks. Beverages Alcohol. Sugar-sweetened soft drinks. Sugar-sweetened tea. Caffeinated coffee and tea. Condiments Sugar. Honey. Agave nectar. Molasses. Sweets and Desserts Chocolate. Cake. Cookies. Candy. Other Potato chips. Pretzels. Salted nuts. Candied nuts. The items listed above may not be a complete list of foods and beverages to avoid. Contact your dietitian for more information. This information is not intended to replace advice given to you by your health care provider. Make sure you discuss any questions you have with your health care provider. Document Released: 02/16/2005 Document Revised: 09/01/2015 Document Reviewed: 11/25/2013 Elsevier Interactive Patient Education  [employer].

## 2017-11-02 NOTE — Clinical Social Work Note (Signed)
CSW [facility] [place] can [facility] patient today [facility]. [facility] by 3pm with a 21 day supply of medications. CSW notified MD, patient and patient's father Megan Salonatrick Meine [phone] of above.   Ruthe Mannanandace Jisel Fleet MSW, [address]CSWA [phone]

## 2017-11-02 NOTE — Progress Notes (Signed)
Discharged.  His aunt of her to drive him to a substance abuse rehab center.  [facility] as he was anxious that they would not be filled quickly enough and he has to check in by 3:00 pm

## 2017-11-02 NOTE — Progress Notes (Signed)
Patient ID: Camelia PhenesJohn D Filla, male   DOB: Mar 23, 1961, 57 y.o.   MRN: [id number]   [employer] PROGRESS NOTE  Camelia PhenesJohn D Gramlich [id number] DOB: Mar 23, 1961 DOA: 10/27/2017 PCP: Barbette ReichmannHande, Vishwanath, MD  HPI/Subjective: Patient still complaining of pain in his low back but better than when it was 2 days ago.  Still has some slight tremor.  Objective: Vitals:   11/01/17 2015 11/02/17 0547  BP: 116/72 109/73  Pulse: 61 66  Resp: 18 18  Temp: 97.8 F (36.6 C) 98.5 F (36.9 C)  SpO2: 98% 99%    Filed Weights   10/30/17 0356 10/31/17 0507 11/01/17 0518  Weight: 83.8 kg (184 lb 11.2 oz) 84.8 kg (187 lb) 85.1 kg (187 lb 9.6 oz)    ROS: Review of Systems  Constitutional: Negative for chills and fever.  Eyes: Negative for blurred vision.  Respiratory: Negative for cough and shortness of breath.   Cardiovascular: Negative for chest pain.  Gastrointestinal: Negative for abdominal pain, constipation, diarrhea, nausea and vomiting.  Genitourinary: Negative for dysuria.  Musculoskeletal: Positive for back pain. Negative for joint pain.  Neurological: Positive for tremors. Negative for dizziness and headaches.   Exam: Physical Exam  Constitutional: He is oriented to person, place, and time.  HENT:  Nose: No mucosal edema.  Mouth/Throat: No oropharyngeal exudate or posterior oropharyngeal edema.  Eyes: Pupils are equal, round, and reactive to light. Conjunctivae, EOM and lids are normal.  Neck: No JVD present. Carotid bruit is not present. No edema present. No thyroid mass and no thyromegaly present.  Cardiovascular: S1 normal and S2 normal. Exam reveals no gallop.  No murmur heard. Pulses:      Dorsalis pedis pulses are 2+ on the right side, and 2+ on the left side.  Respiratory: No respiratory distress. He has no wheezes. He has no rhonchi. He has no rales.  GI: Soft. Bowel sounds are normal. There is no tenderness.  Musculoskeletal:       Right ankle: He exhibits no swelling.   Left ankle: He exhibits no swelling.  Lymphadenopathy:    He has no cervical adenopathy.  Neurological: He is alert and oriented to person, place, and time. No cranial nerve deficit.  Slight right upper extremity tremor.  No flapping asterixis.    Skin: Skin is warm. No rash noted. Nails show no clubbing.  Psychiatric: He has a normal mood and affect.      Data Reviewed: Basic Metabolic Panel: Recent Labs  Lab 10/27/17 0230 10/28/17 0359  NA 143 136  K 3.2* 4.1  CL 105 104  CO2 25 25  GLUCOSE 148* 166*  BUN 6 7  CREATININE 0.77 0.72  CALCIUM 8.3* 9.1   Liver Function Tests: Recent Labs  Lab 10/27/17 0230 10/28/17 0359  AST 23 24  ALT 16* 15*  ALKPHOS 71 71  BILITOT 0.5 0.9  PROT 6.7 6.7  ALBUMIN 3.7 3.9   Recent Labs  Lab 10/27/17 0230  LIPASE 35   CBC: Recent Labs  Lab 10/27/17 0230 10/28/17 0359  WBC 4.7 5.3  NEUTROABS 1.9  --   HGB 12.0* 11.5*  HCT 36.8* 36.0*  MCV 80.0 80.6  PLT 237 212   BNP (last 3 results) Recent Labs    08/14/17 0112  BNP 65.0     CBG: Recent Labs  Lab 11/01/17 0747 11/01/17 1149 11/01/17 1701 11/01/17 2018 11/02/17 0758  GLUCAP 183* 151* 214* 88 96      Scheduled Meds: . amiodarone  200 mg  Oral Daily  . aspirin  325 mg Oral Daily  . citalopram  20 mg Oral Daily  . diltiazem  180 mg Oral Daily  . enoxaparin (LOVENOX) injection  40 mg Subcutaneous Q24H  . folic acid  1 mg Oral Daily  . gabapentin  600 mg Oral TID  . insulin aspart  0-15 Units Subcutaneous TID WC  . insulin aspart  0-5 Units Subcutaneous QHS  . lidocaine  1 patch Transdermal Q24H  . metoprolol succinate  50 mg Oral Daily  . multivitamin with minerals  1 tablet Oral Daily  . mycophenolate  1,000 mg Oral QHS  . mycophenolate  500 mg Oral Q0600  . nicotine  14 mg Transdermal Daily  . oxyCODONE  5 mg Oral Once  . potassium chloride  10 mEq Oral Daily  . predniSONE  30 mg Oral Q breakfast  . thiamine  100 mg Oral Daily   Continuous  Infusions:   Assessment/Plan:  1. Alcohol withdrawal and alcohol abuse.  On alcohol withdrawal protocol.  Told him that I will not be able to give medications at his facility for alcohol withdrawal. 2. 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  Continue trial of steroids.  Prednisone taper upon discharge 3. Essential hypertension.  Continue Cardizem and metoprolol. 4. Paroxysmal atrial fibrillation.  Continue on amiodarone.  Continue Cardizem metoprolol and aspirin. 5. History of cardiomyopathy with ejection fraction of 40%.  6. Depression on Celexa  Code Status:     Code Status Orders  (From admission, onward)        Start     Ordered   10/27/17 1521  Full code  Continuous     10/27/17 1521    Code Status History    Date Active Date Inactive Code Status Order ID Comments User Context   10/21/2017 1945 10/23/2017 1833 Full Code [id number]  Auburn Bilberry, MD Inpatient   10/21/2017 1504 10/21/2017 1945 Full Code [id number]  Arnaldo Natal, MD ED   10/10/2017 1314 10/11/2017 1147 Full Code [id number]  Willy Eddy, MD ED   10/05/2017 2214 10/07/2017 2101 Full Code [id number]  Arnaldo Natal, MD ED   10/05/2017 1924 10/05/2017 2214 Full Code [id number]  Arnaldo Natal, MD ED   10/01/2017 0807 10/04/2017 1650 Full Code [id number]  Barbaraann Rondo, MD Inpatient   08/24/2017 1756 08/27/2017 1849 Full Code [id number]  Enid Baas, MD ED   08/14/2017 0030 08/18/2017 1605 Full Code [id number]  Oralia Manis, MD Inpatient   06/04/2017 0116 06/06/2017 1701 Full Code [id number]  Oralia Manis, MD Inpatient   03/19/2017 1559 03/20/2017 2128 Full Code [id number]  Arnaldo Natal, MD ED   02/25/2017 1421 03/01/2017 1829 Full Code [id number]  Houston Siren, MD Inpatient   11/12/2016 1533 11/14/2016 1425 Full Code [id number]  Marguarite Arbour, MD Inpatient   12/26/2015 1927 12/30/2015 1604 Full Code [id number]  Marguarite Arbour, MD Inpatient      Disposition Plan: Discharge today to  alcohol rehab  Time spent: 31 minutes  Odie Rauen [employer]

## 2017-12-22 ENCOUNTER — Other Ambulatory Visit: Payer: Self-pay

## 2017-12-22 ENCOUNTER — Encounter: Payer: Self-pay | Admitting: Emergency Medicine

## 2017-12-22 ENCOUNTER — Inpatient Hospital Stay
Admission: EM | Admit: 2017-12-22 | Discharge: 2017-12-25 | DRG: 897 | Disposition: A | Discharge status: Home / Self Care | Payer: Medicaid Other | Attending: Internal Medicine | Admitting: Internal Medicine

## 2017-12-22 DIAGNOSIS — I480 Paroxysmal atrial fibrillation: Secondary | ICD-10-CM | POA: Diagnosis present

## 2017-12-22 DIAGNOSIS — M545 Low back pain: Secondary | ICD-10-CM | POA: Diagnosis present

## 2017-12-22 DIAGNOSIS — Z89422 Acquired absence of other left toe(s): Secondary | ICD-10-CM

## 2017-12-22 DIAGNOSIS — F10230 Alcohol dependence with withdrawal, uncomplicated: Secondary | ICD-10-CM

## 2017-12-22 DIAGNOSIS — F10220 Alcohol dependence with intoxication, uncomplicated: Secondary | ICD-10-CM | POA: Diagnosis present

## 2017-12-22 DIAGNOSIS — E1165 Type 2 diabetes mellitus with hyperglycemia: Secondary | ICD-10-CM | POA: Diagnosis present

## 2017-12-22 DIAGNOSIS — F17210 Nicotine dependence, cigarettes, uncomplicated: Secondary | ICD-10-CM | POA: Diagnosis present

## 2017-12-22 DIAGNOSIS — G894 Chronic pain syndrome: Secondary | ICD-10-CM | POA: Diagnosis present

## 2017-12-22 DIAGNOSIS — G7000 Myasthenia gravis without (acute) exacerbation: Secondary | ICD-10-CM | POA: Diagnosis present

## 2017-12-22 DIAGNOSIS — I110 Hypertensive heart disease with heart failure: Secondary | ICD-10-CM | POA: Diagnosis present

## 2017-12-22 DIAGNOSIS — F10929 Alcohol use, unspecified with intoxication, unspecified: Secondary | ICD-10-CM | POA: Diagnosis present

## 2017-12-22 DIAGNOSIS — Z9114 Patient's other noncompliance with medication regimen: Secondary | ICD-10-CM

## 2017-12-22 DIAGNOSIS — Y908 Blood alcohol level of 240 mg/100 ml or more: Secondary | ICD-10-CM | POA: Diagnosis present

## 2017-12-22 DIAGNOSIS — I5032 Chronic diastolic (congestive) heart failure: Secondary | ICD-10-CM | POA: Diagnosis present

## 2017-12-22 DIAGNOSIS — F10239 Alcohol dependence with withdrawal, unspecified: Principal | ICD-10-CM | POA: Diagnosis present

## 2017-12-22 DIAGNOSIS — E876 Hypokalemia: Secondary | ICD-10-CM | POA: Diagnosis present

## 2017-12-22 DIAGNOSIS — F10920 Alcohol use, unspecified with intoxication, uncomplicated: Secondary | ICD-10-CM

## 2017-12-22 DIAGNOSIS — E639 Nutritional deficiency, unspecified: Secondary | ICD-10-CM | POA: Diagnosis present

## 2017-12-22 DIAGNOSIS — Z8261 Family history of arthritis: Secondary | ICD-10-CM | POA: Diagnosis not present

## 2017-12-22 DIAGNOSIS — F10930 Alcohol use, unspecified with withdrawal, uncomplicated: Secondary | ICD-10-CM

## 2017-12-22 LAB — COMPREHENSIVE METABOLIC PANEL
ALBUMIN: 4.5 g/dL (ref 3.5–5.0)
ALT: 52 U/L — ABNORMAL HIGH (ref 0–44)
AST: 87 U/L — ABNORMAL HIGH (ref 15–41)
Alkaline Phosphatase: 93 U/L (ref 38–126)
Anion gap: 13 (ref 5–15)
BUN: 14 mg/dL (ref 6–20)
CHLORIDE: 99 mmol/L (ref 98–111)
CO2: 28 mmol/L (ref 22–32)
Calcium: 8.6 mg/dL — ABNORMAL LOW (ref 8.9–10.3)
Creatinine, Ser: 0.81 mg/dL (ref 0.61–1.24)
GFR calc Af Amer: >60 mL/min (ref >60)
GFR calc non Af Amer: >60 mL/min (ref >60)
GLUCOSE: 149 mg/dL — AB (ref 70–99)
POTASSIUM: 3.3 mmol/L — AB (ref 3.5–5.1)
Sodium: 140 mmol/L (ref 135–145)
Total Bilirubin: 0.9 mg/dL (ref 0.3–1.2)
Total Protein: 7.6 g/dL (ref 6.5–8.1)

## 2017-12-22 LAB — GLUCOSE, CAPILLARY
GLUCOSE-CAPILLARY: 115 mg/dL — AB (ref 70–99)
GLUCOSE-CAPILLARY: 131 mg/dL — AB (ref 70–99)
Glucose-Capillary: 106 mg/dL — ABNORMAL HIGH (ref 70–99)

## 2017-12-22 LAB — GASTROINTESTINAL PANEL BY PCR, STOOL (REPLACES STOOL CULTURE)
ADENOVIRUS F40/41: NOT DETECTED
Astrovirus: NOT DETECTED
CRYPTOSPORIDIUM: NOT DETECTED
CYCLOSPORA CAYETANENSIS: NOT DETECTED
Campylobacter species: NOT DETECTED
ENTAMOEBA HISTOLYTICA: NOT DETECTED
ENTEROAGGREGATIVE E COLI (EAEC): NOT DETECTED
Enteropathogenic E coli (EPEC): NOT DETECTED
Enterotoxigenic E coli (ETEC): NOT DETECTED
GIARDIA LAMBLIA: NOT DETECTED
Norovirus GI/GII: NOT DETECTED
Plesimonas shigelloides: NOT DETECTED
Rotavirus A: NOT DETECTED
Salmonella species: NOT DETECTED
Sapovirus (I, II, IV, and V): NOT DETECTED
Shiga like toxin producing E coli (STEC): NOT DETECTED
Shigella/Enteroinvasive E coli (EIEC): NOT DETECTED
VIBRIO SPECIES: NOT DETECTED
Vibrio cholerae: NOT DETECTED
YERSINIA ENTEROCOLITICA: NOT DETECTED

## 2017-12-22 LAB — C DIFFICILE QUICK SCREEN W PCR REFLEX
C Diff antigen: NEGATIVE
C Diff interpretation: NOT DETECTED
C Diff toxin: NEGATIVE

## 2017-12-22 LAB — URINALYSIS, COMPLETE (UACMP) WITH MICROSCOPIC
BACTERIA UA: NONE SEEN
Bilirubin Urine: NEGATIVE
Glucose, UA: NEGATIVE mg/dL
Hgb urine dipstick: NEGATIVE
Ketones, ur: NEGATIVE mg/dL
Leukocytes, UA: NEGATIVE
Nitrite: NEGATIVE
PH: 6 (ref 5.0–8.0)
Protein, ur: 100 mg/dL — AB
SPECIFIC GRAVITY, URINE: 1.013 (ref 1.005–1.030)

## 2017-12-22 LAB — URINE DRUG SCREEN, QUALITATIVE (ARMC ONLY)
Amphetamines, Ur Screen: NOT DETECTED
BARBITURATES, UR SCREEN: NOT DETECTED
CANNABINOID 50 NG, UR ~~LOC~~: NOT DETECTED
COCAINE METABOLITE, UR ~~LOC~~: NOT DETECTED
MDMA (Ecstasy)Ur Screen: NOT DETECTED
Methadone Scn, Ur: NOT DETECTED
OPIATE, UR SCREEN: NOT DETECTED
Phencyclidine (PCP) Ur S: NOT DETECTED
Tricyclic, Ur Screen: NOT DETECTED

## 2017-12-22 LAB — CBC
HEMATOCRIT: 41.7 % (ref 40.0–52.0)
HEMOGLOBIN: 13.4 g/dL (ref 13.0–18.0)
MCH: 26 pg (ref 26.0–34.0)
MCHC: 32.3 g/dL (ref 32.0–36.0)
MCV: 80.6 fL (ref 80.0–100.0)
Platelets: 155 10*3/uL (ref 150–440)
RBC: 5.17 MIL/uL (ref 4.40–5.90)
RDW: 21.5 % — ABNORMAL HIGH (ref 11.5–14.5)
WBC: 7.7 10*3/uL (ref 3.8–10.6)

## 2017-12-22 LAB — MAGNESIUM: Magnesium: 1.8 mg/dL (ref 1.7–2.4)

## 2017-12-22 LAB — PHOSPHORUS: Phosphorus: 2.9 mg/dL (ref 2.5–4.6)

## 2017-12-22 LAB — ETHANOL: Alcohol, Ethyl (B): 270 mg/dL — ABNORMAL HIGH (ref <10)

## 2017-12-22 MED ORDER — LORAZEPAM 2 MG/ML IJ SOLN
0.0000 mg | Freq: Two times a day (BID) | INTRAMUSCULAR | Status: DC
  Administered 2017-12-24: 1 mg via INTRAVENOUS
  Administered 2017-12-24: 2 mg via INTRAVENOUS
  Filled 2017-12-22 (×2): qty 1

## 2017-12-22 MED ORDER — ENOXAPARIN SODIUM 40 MG/0.4ML ~~LOC~~ SOLN
40.0000 mg | SUBCUTANEOUS | Status: DC
  Administered 2017-12-22 – 2017-12-24 (×3): 40 mg via SUBCUTANEOUS
  Filled 2017-12-22 (×3): qty 0.4

## 2017-12-22 MED ORDER — MYCOPHENOLATE MOFETIL 250 MG PO CAPS
1000.0000 mg | ORAL_CAPSULE | Freq: Every day | ORAL | Status: DC
  Administered 2017-12-22 – 2017-12-24 (×3): 1000 mg via ORAL
  Filled 2017-12-22 (×4): qty 4

## 2017-12-22 MED ORDER — FOLIC ACID 1 MG PO TABS
1.0000 mg | ORAL_TABLET | Freq: Every day | ORAL | Status: DC
  Administered 2017-12-22 – 2017-12-25 (×4): 1 mg via ORAL
  Filled 2017-12-22 (×4): qty 1

## 2017-12-22 MED ORDER — LORAZEPAM 1 MG PO TABS
1.0000 mg | ORAL_TABLET | Freq: Four times a day (QID) | ORAL | Status: AC | PRN
  Administered 2017-12-24: 1 mg via ORAL
  Filled 2017-12-22: qty 1

## 2017-12-22 MED ORDER — NICOTINE 21 MG/24HR TD PT24
21.0000 mg | MEDICATED_PATCH | Freq: Once | TRANSDERMAL | Status: AC
  Administered 2017-12-22: 21 mg via TRANSDERMAL

## 2017-12-22 MED ORDER — LORAZEPAM 1 MG PO TABS
ORAL_TABLET | ORAL | Status: AC
  Administered 2017-12-22: 2 mg via ORAL
  Filled 2017-12-22: qty 2

## 2017-12-22 MED ORDER — MYCOPHENOLATE MOFETIL 250 MG PO CAPS
500.0000 mg | ORAL_CAPSULE | Freq: Every morning | ORAL | Status: DC
  Administered 2017-12-22 – 2017-12-25 (×4): 500 mg via ORAL
  Filled 2017-12-22 (×4): qty 2

## 2017-12-22 MED ORDER — ONDANSETRON HCL 4 MG PO TABS: 4.0000 mg | ORAL_TABLET | Freq: Four times a day (QID) | ORAL | Status: DC | PRN

## 2017-12-22 MED ORDER — LORAZEPAM 2 MG/ML IJ SOLN
1.0000 mg | Freq: Four times a day (QID) | INTRAMUSCULAR | Status: AC | PRN
  Administered 2017-12-25: 1 mg via INTRAVENOUS
  Filled 2017-12-22: qty 1

## 2017-12-22 MED ORDER — LOPERAMIDE HCL 2 MG PO CAPS
2.0000 mg | ORAL_CAPSULE | Freq: Four times a day (QID) | ORAL | Status: DC | PRN
  Administered 2017-12-22 – 2017-12-25 (×3): 2 mg via ORAL
  Filled 2017-12-22 (×3): qty 1

## 2017-12-22 MED ORDER — SODIUM CHLORIDE 0.9 % IV BOLUS
1000.0000 mL | Freq: Once | INTRAVENOUS | Status: AC
  Administered 2017-12-22: 1000 mL via INTRAVENOUS

## 2017-12-22 MED ORDER — BISACODYL 5 MG PO TBEC
5.0000 mg | DELAYED_RELEASE_TABLET | Freq: Every day | ORAL | Status: DC | PRN
Start: 2017-12-22 — End: 2017-12-25

## 2017-12-22 MED ORDER — LORAZEPAM 2 MG PO TABS
2.0000 mg | ORAL_TABLET | Freq: Once | ORAL | Status: AC
  Administered 2017-12-22: 2 mg via ORAL

## 2017-12-22 MED ORDER — ONDANSETRON HCL 4 MG/2ML IJ SOLN: 4.0000 mg | Freq: Four times a day (QID) | INTRAMUSCULAR | Status: DC | PRN

## 2017-12-22 MED ORDER — OXYCODONE HCL 5 MG PO TABS
10.0000 mg | ORAL_TABLET | ORAL | Status: DC | PRN
  Administered 2017-12-22 – 2017-12-25 (×14): 10 mg via ORAL
  Filled 2017-12-22 (×14): qty 2

## 2017-12-22 MED ORDER — ASPIRIN EC 325 MG PO TBEC
325.0000 mg | DELAYED_RELEASE_TABLET | Freq: Every day | ORAL | Status: DC
  Administered 2017-12-22 – 2017-12-25 (×4): 325 mg via ORAL
  Filled 2017-12-22 (×4): qty 1

## 2017-12-22 MED ORDER — AMIODARONE HCL 200 MG PO TABS
200.0000 mg | ORAL_TABLET | Freq: Every day | ORAL | Status: DC
  Administered 2017-12-22 – 2017-12-25 (×4): 200 mg via ORAL
  Filled 2017-12-22 (×4): qty 1

## 2017-12-22 MED ORDER — METOPROLOL SUCCINATE ER 50 MG PO TB24
50.0000 mg | ORAL_TABLET | Freq: Every day | ORAL | Status: DC
  Administered 2017-12-22 – 2017-12-25 (×4): 50 mg via ORAL
  Filled 2017-12-22 (×4): qty 1

## 2017-12-22 MED ORDER — NICOTINE 21 MG/24HR TD PT24
MEDICATED_PATCH | TRANSDERMAL | Status: AC
  Administered 2017-12-22: 21 mg via TRANSDERMAL
  Filled 2017-12-22: qty 1

## 2017-12-22 MED ORDER — DIAZEPAM 5 MG PO TABS
10.0000 mg | ORAL_TABLET | Freq: Four times a day (QID) | ORAL | Status: DC | PRN
Start: 2017-12-22 — End: 2017-12-25
  Administered 2017-12-23 – 2017-12-25 (×4): 10 mg via ORAL
  Filled 2017-12-22 (×5): qty 2

## 2017-12-22 MED ORDER — GABAPENTIN 300 MG PO CAPS
600.0000 mg | ORAL_CAPSULE | Freq: Three times a day (TID) | ORAL | Status: DC
  Administered 2017-12-22 – 2017-12-25 (×11): 600 mg via ORAL
  Filled 2017-12-22 (×11): qty 2

## 2017-12-22 MED ORDER — THIAMINE HCL 100 MG/ML IJ SOLN
100.0000 mg | Freq: Every day | INTRAMUSCULAR | Status: DC
  Filled 2017-12-22: qty 2

## 2017-12-22 MED ORDER — LORAZEPAM 2 MG/ML IJ SOLN
0.0000 mg | Freq: Four times a day (QID) | INTRAMUSCULAR | Status: AC
  Administered 2017-12-22 – 2017-12-23 (×3): 1 mg via INTRAVENOUS
  Administered 2017-12-23: 2 mg via INTRAVENOUS
  Administered 2017-12-23: 1 mg via INTRAVENOUS
  Administered 2017-12-23 – 2017-12-24 (×2): 2 mg via INTRAVENOUS
  Filled 2017-12-22 (×7): qty 1

## 2017-12-22 MED ORDER — CHLORDIAZEPOXIDE HCL 25 MG PO CAPS
50.0000 mg | ORAL_CAPSULE | Freq: Once | ORAL | Status: AC
  Administered 2017-12-22: 50 mg via ORAL
  Filled 2017-12-22: qty 2

## 2017-12-22 MED ORDER — SODIUM CHLORIDE 0.9 % IV SOLN
1.0000 mg | Freq: Once | INTRAVENOUS | Status: AC
  Administered 2017-12-22: 1 mg via INTRAVENOUS
  Filled 2017-12-22: qty 0.2

## 2017-12-22 MED ORDER — VITAMIN B-1 100 MG PO TABS
100.0000 mg | ORAL_TABLET | Freq: Every day | ORAL | Status: DC
  Administered 2017-12-22 – 2017-12-25 (×4): 100 mg via ORAL
  Filled 2017-12-22 (×4): qty 1

## 2017-12-22 MED ORDER — POTASSIUM CHLORIDE 10 MEQ/100ML IV SOLN
10.0000 meq | INTRAVENOUS | Status: AC
  Administered 2017-12-22 (×4): 10 meq via INTRAVENOUS
  Filled 2017-12-22 (×4): qty 100

## 2017-12-22 MED ORDER — MORPHINE SULFATE (PF) 4 MG/ML IV SOLN
4.0000 mg | INTRAVENOUS | Status: DC | PRN
  Administered 2017-12-22 – 2017-12-24 (×10): 4 mg via INTRAVENOUS
  Filled 2017-12-22 (×10): qty 1

## 2017-12-22 MED ORDER — THIAMINE HCL 100 MG/ML IJ SOLN
100.0000 mg | Freq: Once | INTRAMUSCULAR | Status: AC
  Administered 2017-12-22: 100 mg via INTRAVENOUS
  Filled 2017-12-22: qty 2

## 2017-12-22 MED ORDER — DILTIAZEM HCL ER COATED BEADS 180 MG PO CP24
180.0000 mg | ORAL_CAPSULE | Freq: Every day | ORAL | Status: DC
  Administered 2017-12-22 – 2017-12-25 (×4): 180 mg via ORAL
  Filled 2017-12-22 (×4): qty 1

## 2017-12-22 MED ORDER — CITALOPRAM HYDROBROMIDE 20 MG PO TABS
20.0000 mg | ORAL_TABLET | Freq: Every day | ORAL | Status: DC
  Administered 2017-12-22 – 2017-12-25 (×4): 20 mg via ORAL
  Filled 2017-12-22 (×4): qty 1

## 2017-12-22 MED ORDER — ADULT MULTIVITAMIN W/MINERALS CH: 1.0000 | ORAL_TABLET | Freq: Every day | ORAL | Status: DC

## 2017-12-22 MED ORDER — LIDOCAINE 5 % EX PTCH
1.0000 | MEDICATED_PATCH | CUTANEOUS | Status: DC
  Administered 2017-12-22 – 2017-12-25 (×4): 1 via TRANSDERMAL
  Filled 2017-12-22 (×4): qty 1

## 2017-12-22 MED ORDER — LORAZEPAM 2 MG/ML IJ SOLN
2.0000 mg | Freq: Once | INTRAMUSCULAR | Status: AC
  Administered 2017-12-22: 2 mg via INTRAVENOUS
  Filled 2017-12-22: qty 1

## 2017-12-22 MED ORDER — ADULT MULTIVITAMIN W/MINERALS CH
1.0000 | ORAL_TABLET | Freq: Every day | ORAL | Status: DC
  Administered 2017-12-22 – 2017-12-25 (×4): 1 via ORAL
  Filled 2017-12-22 (×4): qty 1

## 2017-12-22 MED ORDER — INSULIN ASPART 100 UNIT/ML ~~LOC~~ SOLN
0.0000 [IU] | Freq: Three times a day (TID) | SUBCUTANEOUS | Status: DC
  Administered 2017-12-23: 2 [IU] via SUBCUTANEOUS
  Administered 2017-12-23 (×2): 1 [IU] via SUBCUTANEOUS
  Administered 2017-12-24: 2 [IU] via SUBCUTANEOUS
  Administered 2017-12-25: 1 [IU] via SUBCUTANEOUS
  Filled 2017-12-22 (×5): qty 1

## 2017-12-22 MED ORDER — LORAZEPAM 1 MG PO TABS
1.0000 mg | ORAL_TABLET | ORAL | Status: AC | PRN
  Administered 2017-12-22: 1 mg via ORAL
  Filled 2017-12-22: qty 1

## 2017-12-22 MED ORDER — LORAZEPAM 2 MG/ML IJ SOLN
1.0000 mg | Freq: Once | INTRAMUSCULAR | Status: AC
  Administered 2017-12-22: 1 mg via INTRAVENOUS
  Filled 2017-12-22: qty 1

## 2017-12-22 MED ORDER — LACTATED RINGERS IV SOLN
INTRAVENOUS | Status: AC
  Administered 2017-12-22 (×2): via INTRAVENOUS

## 2017-12-22 MED ORDER — SENNOSIDES-DOCUSATE SODIUM 8.6-50 MG PO TABS: 1.0000 | ORAL_TABLET | Freq: Every evening | ORAL | Status: DC | PRN

## 2017-12-22 NOTE — ED Triage Notes (Signed)
Pt arrives via [facility] with c/o withdrawal. Pt reports that he ran out of rum yesterday and he has been drinking a lot this week. Pt has hx of diabetes and EMS reports CBG 404. Pt is jittery at this time in triage.

## 2017-12-22 NOTE — ED Provider Notes (Addendum)
[facility] Emergency Department Provider Note  ____________________________________________   I have reviewed the triage vital signs and the nursing notes. Where available I have reviewed prior notes and, if possible and indicated, outside hospital notes.    HISTORY  Chief Complaint Alcohol Intoxication    HPI Kurt Horton is a 57 y.o. male  Atrial fibrillation EtOH abuse diabetes mellitus, poorly controlled, myasthenia gravis, history of alcohol withdrawal but no seizures that he knows of, toe amputations, states he ran out of ROM yesterday usually drinks every day and feels very shaky.  Has not had a seizure has not fallen does not have any abdominal pain or vomiting.  Has not checked his sugar taken any of his diabetes medication in the last several weeks he states.  Denies SI or HI, EMS reported that his glucose was over 400.  Patient states his been feeling shaky for the last couple hours, no other symptoms no other treatment   Past Medical History:  Diagnosis Date  . A-fib (HCC)   . Alcohol abuse   . Alcohol withdrawal (HCC) 11/12/2016  . Diabetes mellitus without complication (HCC)   . DJD (degenerative joint disease)   . Hypertension   . Myasthenia gravis (HCC)   . Renal disorder     Patient Active Problem List   Diagnosis Date Noted  . Malnutrition of moderate degree 10/04/2017  . Chronic combined systolic and diastolic CHF (congestive heart failure) (HCC) 08/13/2017  . Alcohol withdrawal delirium (HCC) 02/26/2017  . Atrial fibrillation with RVR (HCC) 02/25/2017  . Acute alcoholic intoxication with complication (HCC)   . Alcohol abuse   . Alcohol withdrawal (HCC) 11/13/2016  . Substance induced mood disorder (HCC) 11/13/2016  . SVT (supraventricular tachycardia) (HCC) 11/12/2016  . HTN (hypertension) 11/12/2016  . EtOH dependence (HCC) 11/12/2016  . Lumbar radiculopathy 08/24/2016  . Depression with anxiety 08/24/2016  . Chronic low  back pain 01/04/2016  . Pressure ulcer 12/29/2015  . Osteomyelitis (HCC) 12/26/2015  . PVD (peripheral vascular disease) (HCC) 12/26/2015  . Type II diabetes mellitus with manifestations (HCC) 12/26/2015  . Myasthenia gravis (HCC) 12/26/2015    Past Surgical History:  Procedure Laterality Date  . AMPUTATION TOE Left 12/29/2015   Procedure: AMPUTATION TOE;  Surgeon: Gwyneth Revels, DPM;  Location: [facility] ORS;  Service: Podiatry;  Laterality: Left;  . PERIPHERAL VASCULAR CATHETERIZATION N/A 12/27/2015   Procedure: Lower Extremity Angiography;  Surgeon: Annice Needy, MD;  Location: [facility] INVASIVE CV LAB;  Service: Cardiovascular;  Laterality: N/A;  . right hip surgery    . right shoulder surgery      Prior to Admission medications   Medication Sig Start Date End Date Taking? Authorizing Provider  acetaminophen (TYLENOL) 325 MG tablet Take 2 tablets (650 mg total) by mouth every 6 (six) hours as needed for mild pain (or Fever >/= 101). 11/02/17   Alford Highland, MD  amiodarone (PACERONE) 200 MG tablet Take 1 tablet (200 mg total) by mouth daily. 11/02/17   Alford Highland, MD  aspirin 325 MG EC tablet Take 1 tablet (325 mg total) by mouth daily. 11/02/17   Alford Highland, MD  B Complex-C (B-COMPLEX WITH VITAMIN C) tablet Take 1 tablet by mouth daily. 11/02/17   Alford Highland, MD  citalopram (CELEXA) 20 MG tablet Take 1 tablet (20 mg total) by mouth daily. 11/02/17   Alford Highland, MD  diltiazem (CARDIZEM CD) 180 MG 24 hr capsule Take 1 capsule (180 mg total) by mouth daily. 11/02/17 03/02/18  Alford HighlandWieting, Richard, MD  gabapentin (NEURONTIN) 300 MG capsule Take 2 capsules (600 mg total) by mouth 3 (three) times daily. 11/02/17   Alford HighlandWieting, Richard, MD  lidocaine (LIDODERM) 5 % Place 1 patch onto the skin daily. Remove & Discard patch within 12 hours or as directed by MD 11/02/17   Alford HighlandWieting, Richard, MD  metFORMIN (GLUCOPHAGE) 500 MG tablet Take 1 tablet (500 mg total) by mouth daily. 11/02/17   Alford HighlandWieting,  Richard, MD  metoprolol succinate (TOPROL-XL) 50 MG 24 hr tablet Take 1 tablet (50 mg total) by mouth daily. Take with or immediately following a meal. 11/02/17   Alford HighlandWieting, Richard, MD  multivitamin (ONE-A-DAY MEN'S) TABS tablet Take 1 tablet by mouth daily. 11/02/17   Alford HighlandWieting, Richard, MD  mycophenolate (CELLCEPT) 500 MG tablet Take 1 tablet (500 mg total) by mouth 2 (two) times daily. Take 1 tablet (500MG ) by mouth every morning and 2 tablets (1000MG ) by mouth every evening 11/02/17   Alford HighlandWieting, Richard, MD  nicotine (NICODERM CQ - DOSED IN MG/24 HOURS) 14 mg/24hr patch Place 1 patch (14 mg total) onto the skin daily. Okay to substitute generic 11/02/17   Alford HighlandWieting, Richard, MD  predniSONE (DELTASONE) 10 MG tablet 2 tabs po day1; 1 tab po day2; 1/2 tab po day3,4 11/02/17   Alford HighlandWieting, Richard, MD  thiamine 100 MG tablet Take 1 tablet (100 mg total) by mouth daily. 11/02/17   Alford HighlandWieting, Richard, MD    Allergies Nsaids and Other  Family History  Problem Relation Age of Onset  . Rheum arthritis Father     Social History Social History   Tobacco Use  . Smoking status: Current Every Day Smoker    Packs/day: 0.50    Types: Cigarettes  . Smokeless tobacco: Never Used  Substance Use Topics  . Alcohol use: Yes    Comment: Pt had tequila and rum, at least a 5th or a pint a day  . Drug use: No    Review of Systems Constitutional: No fever/chills Eyes: No visual changes. ENT: No sore throat. No stiff neck no neck pain Cardiovascular: Denies chest pain. Respiratory: Denies shortness of breath. Gastrointestinal:   no vomiting.  No diarrhea.  No constipation. Genitourinary: Negative for dysuria. Musculoskeletal: Negative lower extremity swelling Skin: Negative for rash. Neurological: Negative for severe headaches, focal weakness or numbness.   ____________________________________________   PHYSICAL EXAM:  VITAL SIGNS: ED Triage Vitals  Enc Vitals Group     BP 12/22/17 0041 (!) 147/97      Pulse Rate 12/22/17 0041 100     Resp 12/22/17 0041 13     Temp 12/22/17 0041 98.5 F (36.9 C)     Temp Source 12/22/17 0041 Oral     SpO2 12/22/17 0041 99 %     Weight 12/22/17 0039 180 lb (81.6 kg)     Height 12/22/17 0039 5\' 11"  (1.803 m)     Head Circumference --      Peak Flow --      Pain Score 12/22/17 0038 8     Pain Loc --      Pain Edu? --      Excl. in GC? --     Constitutional: Alert and oriented in place unsure of the time. disheveled and chronically ill-appearing  eyes: Conjunctivae are normal Head: Atraumatic HEENT: No congestion/rhinnorhea. Mucous membranes are moist.  Oropharynx non-erythematous Neck:   Nontender with no meningismus, no masses, no stridor Cardiovascular: Tachycardia noted regular rhythm. Grossly normal heart sounds.  Good peripheral  circulation. Respiratory: Normal respiratory effort.  No retractions. Lungs CTAB. Abdominal: Soft and nontender. No distention. No guarding no rebound Back:  There is no focal tenderness or step off.  there is no midline tenderness there are no lesions noted. there is no CVA tenderness Musculoskeletal: No lower extremity tenderness, no upper extremity tenderness. No joint effusions, no DVT signs strong distal pulses no edema Neurologic:  Normal speech and language. No gross focal neurologic deficits are appreciated.  Patient is tremulous Skin:  Skin is warm, dry and intact. No rash noted. Psychiatric: Mood and affect are normal. Speech and behavior are normal.  ____________________________________________   LABS (all labs ordered are listed, but only abnormal results are displayed)  Labs Reviewed  COMPREHENSIVE METABOLIC PANEL  ETHANOL  CBC  URINE DRUG SCREEN, QUALITATIVE ([facility] ONLY)  URINALYSIS, COMPLETE (UACMP) WITH MICROSCOPIC  MAGNESIUM    Pertinent labs  results that were available during my care of the patient were reviewed by me and considered in my medical decision making (see chart for  details). ____________________________________________  EKG  I personally interpreted any EKGs ordered by me or triage Sinus rhythm rate 96 bpm no acute ST elevation or depression, borderline LAD no acute ischemia. ____________________________________________  RADIOLOGY  Pertinent labs & imaging results that were available during my care of the patient were reviewed by me and considered in my medical decision making (see chart for details). If possible, patient and/or family made aware of any abnormal findings.  No results found. ____________________________________________    PROCEDURES  Procedure(s) performed: None  Procedures  Critical Care performed: None  ____________________________________________   INITIAL IMPRESSION / ASSESSMENT AND PLAN / ED COURSE  Pertinent labs & imaging results that were available during my care of the patient were reviewed by me and considered in my medical decision making (see chart for details).  Patient here with evidence of withdrawal with mild confusion which could be his baseline and the shakes, could be intentional, no evidence of decompensated withdrawal such as hallucinations no evidence of seizure, will give him Ativan, and an EKG as he does have history of atrial fib, we will check ethanol, urinalysis urine drug screen, we will check liver function panel, no evidence of acute trauma and he denies having had trauma, nothing CT is indicated certainly there is sufficient pathology from EtOH withdrawal.  Patient has had to be admitted for this in the past.  Given uncontrolled blood sugars, and alcohol withdrawal he may need that again.   ----------------------------------------- 1:12 AM on 12/22/2017 ----------------------------------------- EtOH is 270, not likely to be really in withdrawal therefore, most likely just intoxicated.  We will sober him up and get him home.  I did give him Ativan upon arrival, we will keep him  monitored.  ----------------------------------------- 7:07 AM on 12/22/2017 ----------------------------------------- We did try to get the patient not however he was too somnolent to go home prior to the time he sobered up and now that he is sobering up, he is in withdrawal heart rates 108 and he is very tremulous, we will give him more Ativan, he feels that if he goes home he will die from withdrawal.  He is not interested in rehab he states.  I do not think it safe to send him home, it certainly possible he is malingering but at this point he does appear to be in withdrawal.  Discussed with the hospitalist they will bring him in on CIWA protocol     ____________________________________________   FINAL CLINICAL IMPRESSION(S) /  ED DIAGNOSES  Final diagnoses:  None      This chart was dictated using voice recognition software.  Despite best efforts to proofread,  errors can occur which can change meaning.      Jeanmarie PlantMcShane, James A, MD 12/22/17 [id number]    Jeanmarie PlantMcShane, James A, MD 12/22/17 [id number]    Jeanmarie PlantMcShane, James A, MD 12/22/17 [phone]

## 2017-12-22 NOTE — Progress Notes (Signed)
Patient admitted to unit. Oriented to room, call bell, and staff. Bed in lowest position. Fall safety plan reviewed - bed alarm on. Full assessment to Epic. Skin assessment verified with Greer EeSusan Presto RN. Telemetry box verification with tele clerk and WatsonJanki NT. Will continue to monitor.

## 2017-12-22 NOTE — Progress Notes (Signed)
Patient requesting imodium for diarrhea. Unsure if chronic or not, poor historian. Dr. Imogene Burnhen notified. Order to send sample for c.diff and GI panel.

## 2017-12-22 NOTE — Progress Notes (Signed)
The patient is admitted for alcohol withdrawal this morning. Patient is AAO x3 but looks confused and has tremor.  He complains of back pain and diarrhea. Vital signs are stable. Physical examinations is unremarkable except for tremor. Continue current treatment.  Check GI panel, Imodium as needed for diarrhea. Tobacco abuse.  Smoking cessation was counseled for 3-4 minutes. Discussed with RN. Time spent about 30 minutes.

## 2017-12-22 NOTE — H&P (Addendum)
[facility] - [place] [facility]   PATIENT NAME: Kurt Horton    MR#:  [id number]  DATE OF BIRTH:  28-Sep-1960  DATE OF ADMISSION:  12/22/2017  PRIMARY CARE PHYSICIAN: Barbette ReichmannHande, Vishwanath, MD   REQUESTING/REFERRING PHYSICIAN: Jeanmarie PlantMcShane, James A, MD  CHIEF COMPLAINT:   Chief Complaint  Patient presents with  . Alcohol Intoxication    HISTORY OF PRESENT ILLNESS:  Kurt Horton  is a 57 y.o. male with a known history of T2NIDDM, HTN, Afib (no AC), chronic systolic + diastolic CHF (EF 40%40% w/ grade 1 diastolic dysfxn as of 02/26/2017 Echo), Hx EtOH abuse/dependence/intoxication/withdrawal, who p/w  EtOH abuse/dependence/intoxication and anticipated withdrawal. Was recently admitted from 10/27/2017-11/02/2017. He says he was discharged to EtOH rehab. He was there for two weeks. On his way home from rehab, he stopped to buy alcohol. He states he has been drinking daily since. He states he drinks about 20 small liquor bottles (the variety provided for a fee to travelers on commercial aircraft) per day. Last EtOH consumption was on the evening of Friday 12/21/2017. EtOH level on admission 270. Pt denies N/V/AP. He endorses diaphoresis and tremor. Endorses acute worsening of chronic musculoskeletal (neck + low back) pain. He states he has not taken any of his medications x1wk.  PAST MEDICAL HISTORY:   Past Medical History:  Diagnosis Date  . A-fib (HCC)   . Alcohol abuse   . Alcohol withdrawal (HCC) 11/12/2016  . Diabetes mellitus without complication (HCC)   . DJD (degenerative joint disease)   . Hypertension   . Myasthenia gravis (HCC)   . Renal disorder     PAST SURGICAL HISTORY:   Past Surgical History:  Procedure Laterality Date  . AMPUTATION TOE Left 12/29/2015   Procedure: AMPUTATION TOE;  Surgeon: Gwyneth RevelsJustin Fowler, DPM;  Location: [facility] ORS;  Service: Podiatry;  Laterality: Left;  . PERIPHERAL VASCULAR CATHETERIZATION N/A 12/27/2015   Procedure: Lower Extremity  Angiography;  Surgeon: Annice NeedyJason S Dew, MD;  Location: [facility] INVASIVE CV LAB;  Service: Cardiovascular;  Laterality: N/A;  . right hip surgery    . right shoulder surgery      SOCIAL HISTORY:   Social History   Tobacco Use  . Smoking status: Current Every Day Smoker    Packs/day: 0.50    Types: Cigarettes  . Smokeless tobacco: Never Used  Substance Use Topics  . Alcohol use: Yes    Comment: Pt had tequila and rum, at least a 5th or a pint a day    FAMILY HISTORY:   Family History  Problem Relation Age of Onset  . Rheum arthritis Father     DRUG ALLERGIES:   Allergies  Allergen Reactions  . Nsaids Other (See Comments)    Duodenal ulcers, GI bleeding  . Other Other (See Comments)    Patient states he can't take any "mycin" medications because myasthenia gravis.    REVIEW OF SYSTEMS:   Review of Systems  Constitutional: Positive for diaphoresis. Negative for chills, fever, malaise/fatigue and weight loss.  HENT: Negative for congestion, ear pain, hearing loss, nosebleeds, sinus pain and tinnitus.   Eyes: Negative for blurred vision, double vision and photophobia.  Respiratory: Negative for cough, hemoptysis, sputum production, shortness of breath and wheezing.   Cardiovascular: Negative for chest pain, palpitations, orthopnea, claudication, leg swelling and PND.  Gastrointestinal: Negative for abdominal pain, blood in stool, constipation, diarrhea, heartburn, melena, nausea and vomiting.  Genitourinary: Negative for dysuria, frequency, hematuria and urgency.  Musculoskeletal: Positive for  back pain and neck pain. Negative for joint pain and myalgias.  Skin: Negative for itching and rash.  Neurological: Positive for tremors. Negative for dizziness, tingling, sensory change, speech change, focal weakness, seizures, loss of consciousness, weakness and headaches.  Psychiatric/Behavioral: Negative for memory loss. The patient does not have insomnia.     MEDICATIONS AT HOME:    Prior to Admission medications   Medication Sig Start Date End Date Taking? Authorizing Provider  acetaminophen (TYLENOL) 325 MG tablet Take 2 tablets (650 mg total) by mouth every 6 (six) hours as needed for mild pain (or Fever >/= 101). 11/02/17   Alford Highland, MD  amiodarone (PACERONE) 200 MG tablet Take 1 tablet (200 mg total) by mouth daily. 11/02/17   Alford Highland, MD  aspirin 325 MG EC tablet Take 1 tablet (325 mg total) by mouth daily. 11/02/17   Alford Highland, MD  B Complex-C (B-COMPLEX WITH VITAMIN C) tablet Take 1 tablet by mouth daily. 11/02/17   Alford Highland, MD  citalopram (CELEXA) 20 MG tablet Take 1 tablet (20 mg total) by mouth daily. 11/02/17   Alford Highland, MD  diltiazem (CARDIZEM CD) 180 MG 24 hr capsule Take 1 capsule (180 mg total) by mouth daily. 11/02/17 03/02/18  Alford Highland, MD  gabapentin (NEURONTIN) 300 MG capsule Take 2 capsules (600 mg total) by mouth 3 (three) times daily. 11/02/17   Alford Highland, MD  lidocaine (LIDODERM) 5 % Place 1 patch onto the skin daily. Remove & Discard patch within 12 hours or as directed by MD 11/02/17   Alford Highland, MD  metFORMIN (GLUCOPHAGE) 500 MG tablet Take 1 tablet (500 mg total) by mouth daily. 11/02/17   Alford Highland, MD  metoprolol succinate (TOPROL-XL) 50 MG 24 hr tablet Take 1 tablet (50 mg total) by mouth daily. Take with or immediately following a meal. 11/02/17   Alford Highland, MD  multivitamin (ONE-A-DAY MEN'S) TABS tablet Take 1 tablet by mouth daily. 11/02/17   Alford Highland, MD  mycophenolate (CELLCEPT) 500 MG tablet Take 1 tablet (500 mg total) by mouth 2 (two) times daily. Take 1 tablet (500MG ) by mouth every morning and 2 tablets (1000MG ) by mouth every evening 11/02/17   Alford Highland, MD  nicotine (NICODERM CQ - DOSED IN MG/24 HOURS) 14 mg/24hr patch Place 1 patch (14 mg total) onto the skin daily. Okay to substitute generic 11/02/17   Alford Highland, MD  predniSONE (DELTASONE) 10 MG  tablet 2 tabs po day1; 1 tab po day2; 1/2 tab po day3,4 11/02/17   Alford Highland, MD  thiamine 100 MG tablet Take 1 tablet (100 mg total) by mouth daily. 11/02/17   Alford Highland, MD      VITAL SIGNS:  Blood pressure (!) 145/89, pulse 92, temperature 98.3 F (36.8 C), temperature source Oral, resp. rate (!) 22, height 5\' 11"  (1.803 m), weight 81.6 kg, SpO2 98 %.  PHYSICAL EXAMINATION:  Physical Exam  Constitutional: He is oriented to person, place, and time. He appears well-developed and well-nourished. He is active and cooperative.  Non-toxic appearance. He does not have a sickly appearance. No distress. He is not intubated.  HENT:  Head: Normocephalic and atraumatic.  Mouth/Throat: Oropharynx is clear and moist. No oropharyngeal exudate.  Eyes: Conjunctivae, EOM and lids are normal. No scleral icterus.  Neck: Neck supple. No JVD present. No thyromegaly present.  Cardiovascular: Normal rate, regular rhythm, S1 normal, S2 normal and normal heart sounds.  No extrasystoles are present. Exam reveals no gallop, no S3,  no S4, no distant heart sounds and no friction rub.  No murmur heard. Pulmonary/Chest: Effort normal. No accessory muscle usage or stridor. Tachypnea noted. No apnea and no bradypnea. He is not intubated. No respiratory distress. He has decreased breath sounds in the right upper field, the right middle field, the right lower field, the left upper field, the left middle field and the left lower field. He has no wheezes. He has no rhonchi. He has no rales.  Abdominal: Soft. He exhibits no distension. Bowel sounds are decreased. There is no tenderness. There is no rigidity, no rebound and no guarding.  Musculoskeletal: Normal range of motion. He exhibits no edema or tenderness.  Lymphadenopathy:    He has no cervical adenopathy.  Neurological: He is alert and oriented to person, place, and time. He is not disoriented.  Skin: Skin is warm and intact. No rash noted. He is  diaphoretic. No erythema.  Psychiatric: He has a normal mood and affect. His speech is normal and behavior is normal. Judgment and thought content normal. Cognition and memory are normal.   LABORATORY PANEL:   CBC Recent Labs  Lab 12/22/17 0043  WBC 7.7  HGB 13.4  HCT 41.7  PLT 155   ------------------------------------------------------------------------------------------------------------------  Chemistries  Recent Labs  Lab 12/22/17 0043  NA 140  K 3.3*  CL 99  CO2 28  GLUCOSE 149*  BUN 14  CREATININE 0.81  CALCIUM 8.6*  MG 1.8  AST 87*  ALT 52*  ALKPHOS 93  BILITOT 0.9   ------------------------------------------------------------------------------------------------------------------  Cardiac Enzymes No results for input(s): TROPONINI in the last 168 hours. ------------------------------------------------------------------------------------------------------------------  RADIOLOGY:  No results found.  IMPRESSION AND PLAN:   A/P: 59M w/ Hx EtOH abuse/dependence/intoxication/withdrawal p/w EtOH abuse/dependence/intoxication and anticipated withdrawal. Hypokalemia, hyperglycemia (w/ T2NIDDM), hypocalcemia, transaminasemia, medication non-adherence. Musculoskeletal pain. -EtOH abuse/dependence/intoxication/withdrawal: (+) daily EtOH. EtOH level 270 on admission. CIWA, Ativan + Valium PRN. Thiamine/folate/MVI. Was recently D/Ced from EtOH rehab (11/2017). Not likely to benefit from EtOH rehab. No Tylenol. Phos level pending. -Hypokalemia: Likely 2/2 EtOH abuse, nutritional deficiency. (-) N/V. Gentle IVF LR, pulse ox. Replete K+. Mag level 1.8. Telemetry, cardiac monitoring. -Hyperglycemia/T2NIDDM: SSI. Hold PO antihyperglycemics. -Hypocalcemia: Ionized calcium. -Transaminasemia: Likely 2/2 acute EtOH abuse. AST/ALT < 2-3x ULN, do not suspect alcoholic hepatitis. -Medication non-adherence: Off all meds x1wk. Cellcept level. Resume home meds/formulary subs. -Afib: In  sinus. Not on systemic anticoagulation. -MSK pain: c/o acute worsening of chronic neck/back pain. Lidoderm, Oxycodone. -c/w home meds/formulary subs. -FEN/GI: Cardiac diabetic diet as tolerated. -DVT PPx: Lovenox. -Code status: Full code. -Disposition: Admission, > 2 midnights.   All the records are reviewed and case discussed with ED provider. Management plans discussed with the patient, family and they are in agreement.  CODE STATUS: Full code.  TOTAL TIME TAKING CARE OF THIS PATIENT: 75 minutes.    Barbaraann RondoPrasanna Trisha Morandi M.D on 12/22/2017 at 8:04 AM  Between 7am to 6pm - Pager - [phone]  After 6pm go to [email or web address] - Social research officer, governmentpassword EPAS [facility]  [facility]  Office  [phone]  CC: Primary care physician; Barbette ReichmannHande, Vishwanath, MD   Note: This dictation was prepared with Dragon dictation along with smaller phrase technology. Any transcriptional errors that result from this process are unintentional.

## 2017-12-22 NOTE — ED Notes (Signed)
Attempt iv insertion without success. Blood was collected, unable to advance iv catheter.

## 2017-12-23 LAB — COMPREHENSIVE METABOLIC PANEL
ALBUMIN: 3.9 g/dL (ref 3.5–5.0)
ALK PHOS: 75 U/L (ref 38–126)
ALT: 32 U/L (ref 0–44)
ANION GAP: 10 (ref 5–15)
AST: 41 U/L (ref 15–41)
BILIRUBIN TOTAL: 1.5 mg/dL — AB (ref 0.3–1.2)
BUN: 11 mg/dL (ref 6–20)
CO2: 26 mmol/L (ref 22–32)
Calcium: 8.9 mg/dL (ref 8.9–10.3)
Chloride: 102 mmol/L (ref 98–111)
Creatinine, Ser: 0.68 mg/dL (ref 0.61–1.24)
GFR calc Af Amer: >60 mL/min (ref >60)
GFR calc non Af Amer: >60 mL/min (ref >60)
GLUCOSE: 105 mg/dL — AB (ref 70–99)
POTASSIUM: 3.2 mmol/L — AB (ref 3.5–5.1)
SODIUM: 138 mmol/L (ref 135–145)
Total Protein: 6.6 g/dL (ref 6.5–8.1)

## 2017-12-23 LAB — GLUCOSE, CAPILLARY
GLUCOSE-CAPILLARY: 178 mg/dL — AB (ref 70–99)
Glucose-Capillary: 130 mg/dL — ABNORMAL HIGH (ref 70–99)
Glucose-Capillary: 128 mg/dL — ABNORMAL HIGH (ref 70–99)
Glucose-Capillary: 151 mg/dL — ABNORMAL HIGH (ref 70–99)

## 2017-12-23 MED ORDER — POTASSIUM CHLORIDE CRYS ER 20 MEQ PO TBCR
40.0000 meq | EXTENDED_RELEASE_TABLET | Freq: Once | ORAL | Status: AC
  Administered 2017-12-23: 40 meq via ORAL
  Filled 2017-12-23: qty 2

## 2017-12-23 MED ORDER — NICOTINE POLACRILEX 2 MG MT GUM
2.0000 mg | CHEWING_GUM | OROMUCOSAL | Status: DC | PRN
  Filled 2017-12-23 (×4): qty 1

## 2017-12-23 NOTE — Progress Notes (Signed)
Patient now having what appears to be both auditory and visual hallucinations. He thinks family are right out in the hallway talking and laughing. Also he frequently requests Valium and pain medication, even after being told multiple times the next available PRN administration time(s). Will continue to monitor neurological + physical withdrawal statuses. Jari FavreSteven M [facility] Cent[facility]hoff

## 2017-12-23 NOTE — Clinical Social Work Note (Signed)
Clinical Social Work Assessment  Patient Details  Name: Kurt Horton MRN: [id number] Date of Birth: Feb 23, 1961  Date of referral:  12/23/17               Reason for consult:  Housing Concerns/Homelessness, Substance Use/ETOH Abuse, Mental Health Concerns, Emotional/Coping/Adjustment to Illness                Permission sought to share information with:  Facility Art therapist granted to share information::  Yes, Verbal Permission Granted  Name::        Agency::  [facility]  Relationship::     Contact Information:     Housing/Transportation Living arrangements for the past 2 months:  Single Family Home Source of Information:  Patient, Medical Team Patient Interpreter Needed:  None Criminal Activity/Legal Involvement Pertinent to Current Situation/Hospitalization:  No - Comment as needed Significant Relationships:  Parents Lives with:  Parents Do you feel safe going back to the place where you live?  Yes Need for family participation in patient care:  No (Coment)  Care giving concerns: Patient admitted for ETOH withdrawal; patient cannot return to his home; patient has no social supports; patient has limited insight   Facilities manager / plan:  The CSW met with the patient at bedside to discuss discharge planning and aftercare. The patient began with concerns about whether or not his disability check is still in effect. The CSW explained that the patient would need to contact [employer] for that information. The CSW provided to contact number. Next, the client discussed his concern about getting his property from his parents' home. The patient shared that his mother "is losing it" and is "running [his] father to death". The CSW asked if he could contact his father to assist in getting his belongings, and the patient shared that his mother "would accuse [the patient] of stealing things". The CSW advised that the patient can contact the [facility]' non-emergency  number for assistance with gathering his belongings.   The CSW then provided an AUDIT and an SBIRT with the patient's permission. The patient scored a 34 on the AUDIT. The CSW provided brief alcohol psychoeducation and asked the patient if he is willing to seek treatment for alcohol use disorder. The patient shared that he recently finished 2 weeks with "[facility]" ([facility] in [place]). After leaving, the patient immediately purchased alcohol. The CSW asked if the patient would consider inpatient treatment such as [facility] or [facility]. The patient declined and stated that he wants to get an apartment before he gets treatment. The CSW explained that he is at risk of relapse should he not address his recovery prior to discharge. The patient stated that he would accept information about [facility] but did not want a warm referral.  The patient is in the contemplation stage of change in that he realizes that he has a problem, but his insight into how severe his substance use behaviors are is limited. The patient drinks 20-25 airline bottles of rum daily, which is equivalent to 20-25 shots/ounces of liquor. The patient also does not seem to understand the correlation between his mental health, substance use, and physical health, despite having just finished an inpatient substance treatment program. Of note, the patient shared that he collects guns and is wanting to take legal action against [facility] for denying him a permit for gun ownership. The CSW asked if the patient has ever had an inpatient psychiatric admission, and he answered in the  affirmative, but became significantly guarded when the CSW asked where, when and for what reason. The patient has a history of anxiety and has been asking for Valium repeatedly during this admission. The patient has received Celexa as well to control depressive symptoms. The patient would benefit from a psychiatric consult as he has begun to have some  delusional thoughts and hallucinations. The patient denies SI/HI.   The CSW will follow up with the patient tomorrow to provide contact information for the [place] (the apartment complex he is trying to enter), and the CSW has provided contact information about [facility], [employer], [employer], and [employer].   Employment status:  Disabled (Comment on whether or not currently receiving Disability) Insurance information:  Medicaid [place] PT Recommendations:  Not assessed at this time Information / Referral to community resources:  Outpatient Psychiatric Care (Comment Required), Outpatient Substance Abuse Treatment Options  Patient/Family's Response to care:  The patient thanked the CSW.  Patient/Family's Understanding of and Emotional Response to Diagnosis, Current Treatment, and Prognosis:  The patient has limited insight into the severity of his alcohol use disorder and is in the contemplation stage of change. The patient is not willing to address his needs through inpatient substance treatment options.  Emotional Assessment Appearance:  Appears stated age Attitude/Demeanor/Rapport:  Apprehensive, Attention Seeking, Inconsistent, Guarded, Self-Absorbed, Engaged, Self-Confident Affect (typically observed):  Anxious, Apprehensive, Guarded, In denial, Restless Orientation:  Oriented to Self, Oriented to Place, Oriented to  Time, Oriented to Situation Alcohol / Substance use:  Alcohol Use, Tobacco Use(The patient vapes nicotine and uses 20-25 ounces of alcohol qd) Psych involvement (Current and /or in the community):  No (Comment)  Discharge Needs  Concerns to be addressed:  Mental Health Concerns, Substance Abuse Concerns, Homelessness, Lack of Support, Compliance Issues Concerns, Coping/Stress Concerns Readmission within the last 30 days:  No Current discharge risk:  Chronically ill, Substance Abuse, Homeless Barriers to Discharge:  Family Issues, Continued Medical Work up, Homeless with  medical needs, Active Substance Use   Zettie Pho, LCSW 12/23/2017, 4:22 PM

## 2017-12-23 NOTE — Evaluation (Signed)
Physical Therapy Evaluation Patient Details Name: Kurt PhenesJohn D Proto MRN: [id number] DOB: 11/13/1960 Today's Date: 12/23/2017   History of Present Illness  57 yo male with onset of falls, EtOH abuse and detoxing, withdrawal with auditory and visual hallucinations.  Had low K+, tranaminasemia, elevated BS, referred to PT for mobility assessment.  PMHx:  myasthenia gravis, EtOH abuse,   Clinical Impression  Pt was assessed for mobility and mainly is struggling with his neuropathy and weakness with poor LE sensation.  Will continue on to work with him on strengthening and balance to increase his safety with all mobility.  Follow acutely for same.    Follow Up Recommendations SNF    Equipment Recommendations  None recommended by PT    Recommendations for Other Services       Precautions / Restrictions Precautions Precautions: Fall Precaution Comments: monitor pulses Restrictions Weight Bearing Restrictions: No      Mobility  Bed Mobility Overal bed mobility: Needs Assistance Bed Mobility: Supine to Sit;Sit to Supine     Supine to sit: Supervision Sit to supine: Supervision   General bed mobility comments: pt is impulsive with transition to side of bed  Transfers Overall transfer level: Needs assistance Equipment used: Rolling walker (2 wheeled);1 person hand held assist Transfers: Sit to/from Stand Sit to Stand: Mod assist         General transfer comment: mod assist to power up and control balance in standing esp with R ankle rolling  Ambulation/Gait Ambulation/Gait assistance: Mod assist Gait Distance (Feet): 15 Feet(5 x 3) Assistive device: Rolling walker (2 wheeled) Gait Pattern/deviations: Step-to pattern;Decreased stride length;Shuffle;Decreased dorsiflexion - right;Wide base of support;Trunk flexed;Ataxic Gait velocity: reduced Gait velocity interpretation: <1.8 ft/sec, indicate of risk for recurrent falls General Gait Details: unsteady ankles with tendency to  list backward, using bed to control balance  Stairs            Wheelchair Mobility    Modified Rankin (Stroke Patients Only)       Balance Overall balance assessment: History of Falls;Needs assistance Sitting-balance support: Feet supported Sitting balance-Leahy Scale: Good   Postural control: Posterior lean Standing balance support: Bilateral upper extremity supported;During functional activity Standing balance-Leahy Scale: Poor Standing balance comment: control of R ankle is weak                             Pertinent Vitals/Pain Pain Assessment: No/denies pain    Home Living Family/patient expects to be discharged to:: Private residence Living Arrangements: Parent Available Help at Discharge: Other (Comment) Type of Home: House Home Access: Level entry     Home Layout: One level Home Equipment: Walker - 2 wheels;Cane - single point Additional Comments: pt is not going to be going to his parents home    Prior Function Level of Independence: Independent with assistive device(s)(SPC)               Hand Dominance   Dominant Hand: Right    Extremity/Trunk Assessment   Upper Extremity Assessment Upper Extremity Assessment: Overall WFL for tasks assessed    Lower Extremity Assessment Lower Extremity Assessment: Generalized weakness    Cervical / Trunk Assessment Cervical / Trunk Assessment: Normal  Communication   Communication: No difficulties  Cognition Arousal/Alertness: Awake/alert Behavior During Therapy: Anxious Overall Cognitive Status: No family/caregiver present to determine baseline cognitive functioning  General Comments: pt is having trouble with his past information and verbalizing his plans      General Comments      Exercises     Assessment/Plan    PT Assessment Patient needs continued PT services  PT Problem List Decreased strength;Decreased range of  motion;Decreased activity tolerance;Decreased balance;Decreased mobility;Decreased coordination;Decreased knowledge of use of DME;Decreased safety awareness;Cardiopulmonary status limiting activity       PT Treatment Interventions DME instruction;Gait training;Functional mobility training;Therapeutic activities;Balance training;Therapeutic exercise;Neuromuscular re-education;Patient/family education    PT Goals (Current goals can be found in the Care Plan section)  Acute Rehab PT Goals Patient Stated Goal: to walk and not fall PT Goal Formulation: With patient Time For Goal Achievement: 01/06/18 Potential to Achieve Goals: Fair    Frequency Min 2X/week   Barriers to discharge Inaccessible home environment;Decreased caregiver support unsure of his help and security of living situation    Co-evaluation               AM-PAC PT "6 Clicks" Daily Activity  Outcome Measure Difficulty turning over in bed (including adjusting bedclothes, sheets and blankets)?: A Little Difficulty moving from lying on back to sitting on the side of the bed? : A Little Difficulty sitting down on and standing up from a chair with arms (e.g., wheelchair, bedside commode, etc,.)?: Unable Help needed moving to and from a bed to chair (including a wheelchair)?: A Lot Help needed walking in hospital room?: A Lot Help needed climbing 3-5 steps with a railing? : Total 6 Click Score: 12    End of Session Equipment Utilized During Treatment: Gait belt Activity Tolerance: Patient limited by fatigue;Treatment limited secondary to medical complications (Comment)(peripheral neuropathy) Patient left: in bed;with call bell/phone within reach;with bed alarm set Nurse Communication: Mobility status PT Visit Diagnosis: Unsteadiness on feet (R26.81);Repeated falls (R29.6);Muscle weakness (generalized) (M62.81);Ataxic gait (R26.0);Difficulty in walking, not elsewhere classified (R26.2)    Time: 8546-27031135-1202 PT Time  Calculation (min) (ACUTE ONLY): 27 min   Charges:   PT Evaluation $PT Eval Moderate Complexity: 1 Mod PT Treatments $Gait Training: 8-22 mins       Ivar DrapeRuth E Tylar Amborn 12/23/2017, 6:06 PM   Samul Dadauth Herndon Grill, PT MS Acute Rehab Dept. Number: [facility] [phone] and [facility] [phone]

## 2017-12-23 NOTE — Plan of Care (Signed)
  Problem: Education: Goal: Knowledge of General Education information will improve Description Including pain rating scale, medication(s)/side effects and non-pharmacologic comfort measures Outcome: Not Progressing Note:  Patient with worsening visual and auditory hallucinations upon reassessment. Will continue to monitor withdrawal symptoms, and notify Dr. Imogene Burnhen if patient's CIWA remains in double digits for the 5 - 7P assessment. Jari FavreSteven [facility], [place]

## 2017-12-23 NOTE — Progress Notes (Signed)
[employer] [facility] [place] [facility]   PATIENT NAME: Kurt SimmerJohn Horton    MR#:  [id number]  DATE OF BIRTH:  Aug 23, 1960  SUBJECTIVE:  CHIEF COMPLAINT:   Chief Complaint  Patient presents with  . Alcohol Intoxication   The patient is drowsy and ask pain medication, Valium due to back pain and anxiety. REVIEW OF SYSTEMS:  Review of Systems  Constitutional: Negative for chills, fever and malaise/fatigue.  HENT: Negative for sore throat.   Eyes: Negative for blurred vision and double vision.  Respiratory: Negative for cough, hemoptysis, shortness of breath, wheezing and stridor.   Cardiovascular: Negative for chest pain, palpitations, orthopnea and leg swelling.  Gastrointestinal: Negative for abdominal pain, blood in stool, diarrhea, melena, nausea and vomiting.  Genitourinary: Negative for dysuria, flank pain and hematuria.  Musculoskeletal: Positive for back pain. Negative for joint pain.  Neurological: Positive for tremors. Negative for dizziness, sensory change, focal weakness, seizures, loss of consciousness, weakness and headaches.  Endo/Heme/Allergies: Negative for polydipsia.  Psychiatric/Behavioral: Negative for depression. The patient is nervous/anxious.     DRUG ALLERGIES:   Allergies  Allergen Reactions  . Nsaids Other (See Comments)    Duodenal ulcers, GI bleeding  . Other Other (See Comments)    Patient states he can't take any "mycin" medications because myasthenia gravis.   VITALS:  Blood pressure (!) 162/86, pulse 91, temperature 98.4 F (36.9 C), temperature source Oral, resp. rate 20, height 5\' 11"  (1.803 m), weight 81.6 kg, SpO2 98 %. PHYSICAL EXAMINATION:  Physical Exam  Constitutional: He is oriented to person, place, and time. He appears well-developed. No distress.  HENT:  Head: Normocephalic.  Mouth/Throat: Oropharynx is clear and moist.  Eyes: Pupils are equal, round, and reactive to light. Conjunctivae and EOM are normal. No scleral  icterus.  Neck: Normal range of motion. Neck supple. No JVD present. No tracheal deviation present.  Cardiovascular: Normal rate, regular rhythm and normal heart sounds. Exam reveals no gallop.  No murmur heard. Pulmonary/Chest: Effort normal and breath sounds normal. No respiratory distress. He has no wheezes. He has no rales.  Abdominal: Soft. Bowel sounds are normal. He exhibits no distension. There is no tenderness. There is no rebound.  Musculoskeletal: Normal range of motion. He exhibits no edema or tenderness.  Neurological: He is alert and oriented to person, place, and time. No cranial nerve deficit.  Tremor.  Skin: No rash noted. No erythema.  Psychiatric:  Drowsy.   LABORATORY PANEL:  Male CBC Recent Labs  Lab 12/22/17 0043  WBC 7.7  HGB 13.4  HCT 41.7  PLT 155   ------------------------------------------------------------------------------------------------------------------ Chemistries  Recent Labs  Lab 12/22/17 0043 12/23/17 0432  NA 140 138  K 3.3* 3.2*  CL 99 102  CO2 28 26  GLUCOSE 149* 105*  BUN 14 11  CREATININE 0.81 0.68  CALCIUM 8.6* 8.9  MG 1.8  --   AST 87* 41  ALT 52* 32  ALKPHOS 93 75  BILITOT 0.9 1.5*   RADIOLOGY:  No results found. ASSESSMENT AND PLAN:   Alcohol intoxication. Continue CIWA protocol.  Ativan and Valium as needed.  Continue folic acid and thiamine. Hypokalemia.  Given potassium supplement, follow-up level. History of A. fib, in sinus rhythm.  Continue aspirin, Lopressor, Cardizem and amiodarone. Diabetes.  Continue sliding scale. Hypertension.  Continue home hypertension medication. Chronic pain syndrome.  Pain control.  All the records are reviewed and case discussed with Care Management/Social Worker. Management plans discussed with the patient, family  and they are in agreement.  CODE STATUS: Full Code  TOTAL TIME TAKING CARE OF THIS PATIENT: 33 minutes.   More than 50% of the time was spent in  counseling/coordination of care: YES  POSSIBLE D/C IN 2 DAYS, DEPENDING ON CLINICAL CONDITION.   Shaune PollackQing Mairlyn Tegtmeyer M.D on 12/23/2017 at 1:36 PM  Between 7am to 6pm - Pager - [phone]  After 6pm go to [email or web address] - Therapist, nutritionalpassword EPAS [facility]  [facility] [place] [facility]

## 2017-12-24 LAB — GLUCOSE, CAPILLARY
GLUCOSE-CAPILLARY: 88 mg/dL (ref 70–99)
GLUCOSE-CAPILLARY: 183 mg/dL — AB (ref 70–99)
Glucose-Capillary: 111 mg/dL — ABNORMAL HIGH (ref 70–99)
Glucose-Capillary: 156 mg/dL — ABNORMAL HIGH (ref 70–99)

## 2017-12-24 LAB — BASIC METABOLIC PANEL
Anion gap: 10 (ref 5–15)
BUN: 16 mg/dL (ref 6–20)
CO2: 25 mmol/L (ref 22–32)
Calcium: 9.3 mg/dL (ref 8.9–10.3)
Chloride: 103 mmol/L (ref 98–111)
Creatinine, Ser: 0.8 mg/dL (ref 0.61–1.24)
GFR calc Af Amer: >60 mL/min (ref >60)
GLUCOSE: 148 mg/dL — AB (ref 70–99)
POTASSIUM: 3.9 mmol/L (ref 3.5–5.1)
Sodium: 138 mmol/L (ref 135–145)

## 2017-12-24 LAB — CBC
HEMATOCRIT: 38.8 % — AB (ref 40.0–52.0)
Hemoglobin: 12.3 g/dL — ABNORMAL LOW (ref 13.0–18.0)
MCH: 26.1 pg (ref 26.0–34.0)
MCHC: 31.7 g/dL — ABNORMAL LOW (ref 32.0–36.0)
MCV: 82.2 fL (ref 80.0–100.0)
Platelets: 109 10*3/uL — ABNORMAL LOW (ref 150–440)
RBC: 4.72 MIL/uL (ref 4.40–5.90)
RDW: 20.9 % — ABNORMAL HIGH (ref 11.5–14.5)
WBC: 5.9 10*3/uL (ref 3.8–10.6)

## 2017-12-24 LAB — CALCIUM, IONIZED: Calcium, Ionized, Serum: 4.4 mg/dL — ABNORMAL LOW (ref 4.5–5.6)

## 2017-12-24 MED ORDER — MAGNESIUM SULFATE 2 GM/50ML IV SOLN
2.0000 g | Freq: Once | INTRAVENOUS | Status: AC
  Administered 2017-12-24: 2 g via INTRAVENOUS
  Filled 2017-12-24: qty 50

## 2017-12-24 MED ORDER — ACETAMINOPHEN 325 MG PO TABS: 650.0000 mg | ORAL_TABLET | Freq: Four times a day (QID) | ORAL | Status: DC | PRN

## 2017-12-24 MED ORDER — NICOTINE 14 MG/24HR TD PT24
14.0000 mg | MEDICATED_PATCH | Freq: Every day | TRANSDERMAL | Status: DC
  Administered 2017-12-24: 14 mg via TRANSDERMAL
  Filled 2017-12-24: qty 1

## 2017-12-24 NOTE — Progress Notes (Signed)
PT Cancellation Note  Patient Details Name: Kurt PhenesJohn D Horton MRN: [id number] DOB: 04/19/1961   Cancelled Treatment:    Reason Eval/Treat Not Completed: Fatigue/lethargy limiting ability to participate. Treatment attempted; pt soundly sleeping and unable to awaken through voice/touch. Re attempt at a later date.    Scot DockHeidi E Lavida Patch, PTA 12/24/2017, 3:51 PM

## 2017-12-24 NOTE — Progress Notes (Signed)
[employer] - [place] [facility]   PATIENT NAME: Kurt Horton    MR#:  [id number]  DATE OF BIRTH:  May 12, 1960  SUBJECTIVE:  States he is feeling better this morning. Says he started drinking heavily because his ex-wife started dating another man. Wants to stop drinking.  REVIEW OF SYSTEMS:  Review of Systems  Constitutional: Negative for chills, fever and malaise/fatigue.  HENT: Negative for sore throat.   Eyes: Negative for blurred vision and double vision.  Respiratory: Negative for cough, hemoptysis, shortness of breath, wheezing and stridor.   Cardiovascular: Negative for chest pain, palpitations, orthopnea and leg swelling.  Gastrointestinal: Negative for abdominal pain, blood in stool, diarrhea, melena, nausea and vomiting.  Genitourinary: Negative for dysuria, flank pain and hematuria.  Musculoskeletal: Positive for back pain. Negative for joint pain.  Neurological: Positive for tremors. Negative for dizziness, sensory change, focal weakness, seizures, loss of consciousness, weakness and headaches.  Endo/Heme/Allergies: Negative for polydipsia.  Psychiatric/Behavioral: Negative for depression. The patient is nervous/anxious.     DRUG ALLERGIES:   Allergies  Allergen Reactions  . Nsaids Other (See Comments)    Duodenal ulcers, GI bleeding  . Other Other (See Comments)    Patient states he can't take any "mycin" medications because myasthenia gravis.   VITALS:  Blood pressure 109/66, pulse 63, temperature 97.8 F (36.6 C), temperature source Oral, resp. rate 18, height 5\' 11"  (1.803 m), weight 81.6 kg, SpO2 100 %. PHYSICAL EXAMINATION:  Physical Exam  Constitutional: He is oriented to person, place, and time. He appears well-developed. No distress.  HENT:  Head: Normocephalic.  Mouth/Throat: Oropharynx is clear and moist.  Eyes: Pupils are equal, round, and reactive to light. Conjunctivae and EOM are normal. No scleral icterus.  Neck: Normal  range of motion. Neck supple. No JVD present. No tracheal deviation present.  Cardiovascular: Normal rate, regular rhythm and normal heart sounds. Exam reveals no gallop.  No murmur heard. Pulmonary/Chest: Effort normal and breath sounds normal. No respiratory distress. He has no wheezes. He has no rales.  Abdominal: Soft. Bowel sounds are normal. He exhibits no distension. There is no tenderness. There is no rebound.  Musculoskeletal: Normal range of motion. He exhibits no edema or tenderness.  Neurological: He is alert and oriented to person, place, and time. No cranial nerve deficit.  Tremor.  Skin: No rash noted. No erythema.  Psychiatric:  Appears mildly anxious   LABORATORY PANEL:  Male CBC Recent Labs  Lab 12/24/17 1043  WBC 5.9  HGB 12.3*  HCT 38.8*  PLT 109*   ------------------------------------------------------------------------------------------------------------------ Chemistries  Recent Labs  Lab 12/22/17 0043 12/23/17 0432 12/24/17 1043  NA 140 138 138  K 3.3* 3.2* 3.9  CL 99 102 103  CO2 28 26 25   GLUCOSE 149* 105* 148*  BUN 14 11 16   CREATININE 0.81 0.68 0.80  CALCIUM 8.6* 8.9 9.3  MG 1.8  --   --   AST 87* 41  --   ALT 52* 32  --   ALKPHOS 93 75  --   BILITOT 0.9 1.5*  --    RADIOLOGY:  No results found. ASSESSMENT AND PLAN:   Alcohol intoxication. CIWAs have been 13-17. - Continue CIWA protocol.   - Ativan and Valium as needed.  - Continue folic acid and thiamine.  QT prolongation - replete to keep mag >2 and K >4 - d/c zofran - may need to consider stopping amiodarone  History of paroxysmal A. Fib- in sinus rhythm. -  Continue aspirin, Lopressor, Cardizem and amiodarone.  Type 2 diabetes- blood sugars have been well-controlled. - continue sliding scale.  Hypertension- blood pressures have been well-controlled. - continue home metoprolol  Chronic pain syndrome- mostly in the low back. - continue oxycodone and morphine prn  All  the records are reviewed and case discussed with Care Management/Social Worker. Management plans discussed with the patient, family and they are in agreement.  CODE STATUS: Full Code  TOTAL TIME TAKING CARE OF THIS PATIENT: 35 minutes.   More than 50% of the time was spent in counseling/coordination of care: YES  POSSIBLE D/C IN 2 DAYS, DEPENDING ON CLINICAL CONDITION.   Jinny BlossomKaty D Samanvitha Germany M.D on 12/24/2017 at 8:02 PM  Between 7am to 6pm - Pager - [phone]  After 6pm go to [email or web address] - Therapist, nutritionalpassword EPAS [email or web address]  [employer] [place] [facility]

## 2017-12-25 LAB — BASIC METABOLIC PANEL
Anion gap: 10 (ref 5–15)
BUN: 20 mg/dL (ref 6–20)
CALCIUM: 8.6 mg/dL — AB (ref 8.9–10.3)
CO2: 25 mmol/L (ref 22–32)
CREATININE: 0.93 mg/dL (ref 0.61–1.24)
Chloride: 103 mmol/L (ref 98–111)
GFR calc Af Amer: >60 mL/min (ref >60)
GLUCOSE: 107 mg/dL — AB (ref 70–99)
Potassium: 3.5 mmol/L (ref 3.5–5.1)
Sodium: 138 mmol/L (ref 135–145)

## 2017-12-25 LAB — CBC
HCT: 36.4 % — ABNORMAL LOW (ref 40.0–52.0)
Hemoglobin: 11.7 g/dL — ABNORMAL LOW (ref 13.0–18.0)
MCH: 26.5 pg (ref 26.0–34.0)
MCHC: 32.3 g/dL (ref 32.0–36.0)
MCV: 82.2 fL (ref 80.0–100.0)
PLATELETS: 116 10*3/uL — AB (ref 150–440)
RBC: 4.43 MIL/uL (ref 4.40–5.90)
RDW: 21.6 % — AB (ref 11.5–14.5)
WBC: 5.3 10*3/uL (ref 3.8–10.6)

## 2017-12-25 LAB — MYCOPHENOLIC ACID (CELLCEPT)
MPA GLUCURONIDE: NOT DETECTED ug/mL (ref 15–125)
MPA: NOT DETECTED ug/mL (ref 1.0–3.5)

## 2017-12-25 LAB — HIV ANTIBODY (ROUTINE TESTING W REFLEX): HIV Screen 4th Generation wRfx: NONREACTIVE

## 2017-12-25 LAB — GLUCOSE, CAPILLARY
Glucose-Capillary: 150 mg/dL — ABNORMAL HIGH (ref 70–99)
Glucose-Capillary: 141 mg/dL — ABNORMAL HIGH (ref 70–99)

## 2017-12-25 LAB — MAGNESIUM: Magnesium: 2.1 mg/dL (ref 1.7–2.4)

## 2017-12-25 MED ORDER — OXYCODONE HCL 15 MG PO TABS: 15.0000 mg | ORAL_TABLET | Freq: Three times a day (TID) | ORAL | 0 refills | Status: DC | PRN

## 2017-12-25 MED ORDER — BISACODYL 5 MG PO TBEC: 5.0000 mg | DELAYED_RELEASE_TABLET | Freq: Every day | ORAL | 0 refills | Status: DC | PRN

## 2017-12-25 MED ORDER — FOLIC ACID 1 MG PO TABS: 1.0000 mg | ORAL_TABLET | Freq: Every day | ORAL | 0 refills | Status: DC

## 2017-12-25 MED ORDER — FENTANYL 75 MCG/HR TD PT72: 75.0000 ug | MEDICATED_PATCH | TRANSDERMAL | 0 refills | Status: DC

## 2017-12-25 MED ORDER — LORAZEPAM 1 MG PO TABS: ORAL_TABLET | ORAL | 0 refills | Status: DC

## 2017-12-25 MED ORDER — SENNOSIDES-DOCUSATE SODIUM 8.6-50 MG PO TABS: 1.0000 | ORAL_TABLET | Freq: Every evening | ORAL | 0 refills | Status: DC | PRN

## 2017-12-25 MED ORDER — HYDROMORPHONE HCL 1 MG/ML IJ SOLN
1.0000 mg | Freq: Once | INTRAMUSCULAR | Status: AC
  Administered 2017-12-25: 1 mg via INTRAVENOUS
  Filled 2017-12-25: qty 1

## 2017-12-25 NOTE — Discharge Summary (Signed)
[facility] [place] [facility]   PATIENT NAME: Kurt SimmerJohn Horton    MR#:  [id number]  DATE OF BIRTH:  [birth year]  DATE OF ADMISSION:  12/22/2017   ADMITTING PHYSICIAN: Barbaraann RondoPrasanna Sridharan, MD  DATE OF DISCHARGE: 12/25/17  PRIMARY CARE PHYSICIAN: Barbette ReichmannHande, Vishwanath, MD   ADMISSION DIAGNOSIS:  Alcohol withdrawal syndrome without complication (HCC) [F10.230] Alcoholic intoxication without complication (HCC) [F10.920] DISCHARGE DIAGNOSIS:  Active Problems:   Alcohol intoxication (HCC)  SECONDARY DIAGNOSIS:   Past Medical History:  Diagnosis Date  . A-fib (HCC)   . Alcohol abuse   . Alcohol withdrawal (HCC) 11/12/2016  . Diabetes mellitus without complication (HCC)   . DJD (degenerative joint disease)   . Hypertension   . Myasthenia gravis (HCC)   . Renal disorder    HOSPITAL COURSE:   Kurt Horton is a 57 year old male with a PMH of A-fib, alcohol abuse, type 2 diabetes, hypertension, chronic diastolic CHF, and myasthenia gravis who presented to the ED with alcohol intoxication and withdrawal. He was recently admitted 10/27/17-11/02/17 and was discharged to an alcohol rehab facility. As soon as he was discharged, he started drinking ~20 airplane bottles of liquor daily. He was admitted for further management.  Alcohol intoxication/withdrawal- improved, no signs of DTs - treated with ativan per [facility] protocol - discharged on ativan taper x 3 weeks - given thiamine, folic acid, and MVI  QT prolongation- noted on admission EKG. - mag and K repleted - may need to consider stopping amiodarone as an outpatient  History of paroxysmal A. Fib- in sinus rhythm during hospitalization - Continued aspirin, lopressor, cardizem, and amiodarone.  Type 2 diabetes- blood sugars have been well-controlled. - not on any meds at home - monitor  Hypertension- blood pressures have been well-controlled. - continued home metoprolol  Chronic pain syndrome- mostly in the low  back. - discharged on oxycodone and fentanyl patch, which he was previously taking - needs outpatient referral to pain clinic - discharged on bowel regimen  Myasthenia gravis- no signs of acute exacerbation - continued home cellcept  DISCHARGE CONDITIONS:  Alcohol intoxication/withdrawal- improved QT prolongation Paroxysmal a-fib Type 2 diabetes, diet controlled Hypertension Chronic pain syndrome Myasthenia gravis CONSULTS OBTAINED:  Treatment Team:  Barbaraann RondoSridharan, Prasanna, MD DRUG ALLERGIES:   Allergies  Allergen Reactions  . Nsaids Other (See Comments)    Duodenal ulcers, GI bleeding  . Other Other (See Comments)    Patient states he can't take any "mycin" medications because myasthenia gravis.   DISCHARGE MEDICATIONS:   Allergies as of 12/25/2017      Reactions   Nsaids Other (See Comments)   Duodenal ulcers, GI bleeding   Other Other (See Comments)   Patient states he can't take any "mycin" medications because myasthenia gravis.      Medication List    STOP taking these medications   predniSONE 10 MG tablet Commonly known as:  DELTASONE     TAKE these medications   acetaminophen 325 MG tablet Commonly known as:  TYLENOL Take 2 tablets (650 mg total) by mouth every 6 (six) hours as needed for mild pain (or Fever >/= 101).   amiodarone 200 MG tablet Commonly known as:  PACERONE Take 1 tablet (200 mg total) by mouth daily.   aspirin 325 MG EC tablet Take 1 tablet (325 mg total) by mouth daily.   B-complex with vitamin C tablet Take 1 tablet by mouth daily.   bisacodyl 5 MG EC tablet Commonly known as:  DULCOLAX  Take 1 tablet (5 mg total) by mouth daily as needed for moderate constipation.   citalopram 20 MG tablet Commonly known as:  CELEXA Take 1 tablet (20 mg total) by mouth daily.   diltiazem 180 MG 24 hr capsule Commonly known as:  CARDIZEM CD Take 1 capsule (180 mg total) by mouth daily.   fentaNYL 75 MCG/HR Commonly known as:  DURAGESIC -  dosed mcg/hr Place 1 patch (75 mcg total) onto the skin every 3 (three) days.   folic acid 1 MG tablet Commonly known as:  FOLVITE Take 1 tablet (1 mg total) by mouth daily. Start taking on:  12/26/2017   gabapentin 300 MG capsule Commonly known as:  NEURONTIN Take 2 capsules (600 mg total) by mouth 3 (three) times daily.   lidocaine 5 % Commonly known as:  LIDODERM Place 1 patch onto the skin daily. Remove & Discard patch within 12 hours or as directed by MD   LORazepam 1 MG tablet Commonly known as:  ATIVAN Take 1 tablet by mouth twice daily for 1 week, then take 1/2 tablet (0.5mg ) by mouth twice daily for 1 week, then take 1/2 tablet (0.5mg ) by mouth once daily for 1 week   metFORMIN 500 MG tablet Commonly known as:  GLUCOPHAGE Take 1 tablet (500 mg total) by mouth daily.   metoprolol succinate 50 MG 24 hr tablet Commonly known as:  TOPROL-XL Take 1 tablet (50 mg total) by mouth daily. Take with or immediately following a meal.   multivitamin Tabs tablet Take 1 tablet by mouth daily.   mycophenolate 500 MG tablet Commonly known as:  CELLCEPT Take 1 tablet (500 mg total) by mouth 2 (two) times daily. Take 1 tablet (500MG ) by mouth every morning and 2 tablets (1000MG ) by mouth every evening   nicotine 14 mg/24hr patch Commonly known as:  NICODERM CQ - dosed in mg/24 hours Place 1 patch (14 mg total) onto the skin daily. Okay to substitute generic   oxyCODONE 15 MG immediate release tablet Commonly known as:  ROXICODONE Take 1 tablet (15 mg total) by mouth every 8 (eight) hours as needed for pain.   senna-docusate 8.6-50 MG tablet Commonly known as:  Senokot-S Take 1 tablet by mouth at bedtime as needed for mild constipation.   thiamine 100 MG tablet Take 1 tablet (100 mg total) by mouth daily.        DISCHARGE INSTRUCTIONS:  1. F/u with PCP in 1 week 2. Patient needs referral to a pain clinic 3. QT prolongation noted during admission- consider stopping  amiodarone as an outpatient. DIET:  Heart healthy, diabetic diet DISCHARGE CONDITION:  Stable ACTIVITY:  Activity as tolerated OXYGEN:  Home Oxygen: No.  Oxygen Delivery: room air DISCHARGE LOCATION:  nursing home   If you experience worsening of your admission symptoms, develop shortness of breath, life threatening emergency, suicidal or homicidal thoughts you must seek medical attention immediately by calling 911 or calling your MD immediately  if symptoms less severe.  You Must read complete instructions/literature along with all the possible adverse reactions/side effects for all the Medicines you take and that have been prescribed to you. Take any new Medicines after you have completely understood and accpet all the possible adverse reactions/side effects.   Please note  You were cared for by a hospitalist during your hospital stay. If you have any questions about your discharge medications or the care you received while you were in the hospital after you are discharged, you can call  the unit and asked to speak with the hospitalist on call if the hospitalist that took care of you is not available. Once you are discharged, your primary care physician will handle any further medical issues. Please note that NO REFILLS for any discharge medications will be authorized once you are discharged, as it is imperative that you return to your primary care physician (or establish a relationship with a primary care physician if you do not have one) for your aftercare needs so that they can reassess your need for medications and monitor your lab values.    On the day of Discharge:  VITAL SIGNS:  Blood pressure 125/64, pulse 66, temperature 98 F (36.7 C), resp. rate 14, height 5\' 11"  (1.803 m), weight 84.6 kg, SpO2 99 %. PHYSICAL EXAMINATION:  GENERAL:  57 y.o.-year-old patient lying in the bed with no acute distress.  EYES: Pupils equal, round, reactive to light and accommodation. No scleral  icterus. Extraocular muscles intact.  HEENT: Head atraumatic, normocephalic. Oropharynx and nasopharynx clear.  NECK:  Supple, no jugular venous distention. No thyroid enlargement, no tenderness.  LUNGS: Normal breath sounds bilaterally, no wheezing, rales,rhonchi or crepitation. No use of accessory muscles of respiration.  CARDIOVASCULAR: RRR, S1, S2 normal. No murmurs, rubs, or gallops.  ABDOMEN: Soft, non-tender, non-distended. Bowel sounds present. No organomegaly or mass.  EXTREMITIES: No pedal edema, cyanosis, or clubbing.  NEUROLOGIC: Cranial nerves II through XII are intact. +global weakness PSYCHIATRIC: The patient is alert and oriented x 3.  SKIN: No obvious rash, lesion, or ulcer.  DATA REVIEW:   CBC Recent Labs  Lab 12/25/17 0323  WBC 5.3  HGB 11.7*  HCT 36.4*  PLT 116*    Chemistries  Recent Labs  Lab 12/23/17 0432  12/25/17 0323  NA 138   < > 138  K 3.2*   < > 3.5  CL 102   < > 103  CO2 26   < > 25  GLUCOSE 105*   < > 107*  BUN 11   < > 20  CREATININE 0.68   < > 0.93  CALCIUM 8.9   < > 8.6*  MG  --   --  2.1  AST 41  --   --   ALT 32  --   --   ALKPHOS 75  --   --   BILITOT 1.5*  --   --    < > = values in this interval not displayed.     Microbiology Results  Results for orders placed or performed during the hospital encounter of 12/22/17  Gastrointestinal Panel by PCR , Stool     Status: None   Collection Time: 12/22/17  2:23 PM  Result Value Ref Range Status   Campylobacter species NOT DETECTED NOT DETECTED Final   Plesimonas shigelloides NOT DETECTED NOT DETECTED Final   Salmonella species NOT DETECTED NOT DETECTED Final   Yersinia enterocolitica NOT DETECTED NOT DETECTED Final   Vibrio species NOT DETECTED NOT DETECTED Final   Vibrio cholerae NOT DETECTED NOT DETECTED Final   Enteroaggregative E coli (EAEC) NOT DETECTED NOT DETECTED Final   Enteropathogenic E coli (EPEC) NOT DETECTED NOT DETECTED Final   Enterotoxigenic E coli (ETEC) NOT  DETECTED NOT DETECTED Final   Shiga like toxin producing E coli (STEC) NOT DETECTED NOT DETECTED Final   Shigella/Enteroinvasive E coli (EIEC) NOT DETECTED NOT DETECTED Final   Cryptosporidium NOT DETECTED NOT DETECTED Final   Cyclospora cayetanensis NOT DETECTED NOT DETECTED Final  Entamoeba histolytica NOT DETECTED NOT DETECTED Final   Giardia lamblia NOT DETECTED NOT DETECTED Final   Adenovirus F40/41 NOT DETECTED NOT DETECTED Final   Astrovirus NOT DETECTED NOT DETECTED Final   Norovirus GI/GII NOT DETECTED NOT DETECTED Final   Rotavirus A NOT DETECTED NOT DETECTED Final   Sapovirus (I, II, IV, and V) NOT DETECTED NOT DETECTED Final    Comment: Performed at [facility], [address] [place], [place] [address]  C difficile quick scan w PCR reflex     Status: None   Collection Time: 12/22/17  2:23 PM  Result Value Ref Range Status   C Diff antigen NEGATIVE NEGATIVE Final   C Diff toxin NEGATIVE NEGATIVE Final   C Diff interpretation No C. difficile detected.  Final    Comment: Performed at [facility], [address] [place], [place] [address]    RADIOLOGY:  No results found.   Management plans discussed with the patient, family and they are in agreement.  CODE STATUS: Full Code   TOTAL TIME TAKING CARE OF THIS PATIENT: 35 minutes.    Jinny BlossomKaty D Mayo M.D on 12/25/2017 at 9:32 AM  Between 7am to 6pm - Pager - [phone]  After 6pm go to [email or web address] - Social research officer, governmentpassword [email or web address]  [facility] [place] [facility]  Office  [phone]  CC: Primary care physician; Barbette ReichmannHande, Vishwanath, MD   Note: This dictation was prepared with Dragon dictation along with smaller phrase technology. Any transcriptional errors that result from this process are unintentional.

## 2017-12-25 NOTE — Progress Notes (Signed)
Called social worker to get an update on patient's discharge progress, she states that the patient refuses SNF and now just wants to go to private residence. Informed her I would then modify the d/c order to read "d/c to private residence". Went into room to clarify with patient, he denies this claim of refusal and states he needs to make a phone call to clarify with someone else what he needs / should do. Instructed patient to f/u with me as soon as he decides what he wants to do and I will plan accordingly. Will continue to monitor. Kurt Horton M [facility] Jari Favref Bossiermhoff

## 2017-12-25 NOTE — Plan of Care (Signed)
  Problem: Education: Goal: Knowledge of General Education information will improve Description Including pain rating scale, medication(s)/side effects and non-pharmacologic comfort measures Outcome: Not Progressing Note:  Patient was unaware that he was being discharged to a rehab facility today, and he is still exhibiting severe drug-seeking behavior (mostly r/t narcotic pain medications, as recently as this afternoon). Informed patient the plan is for him to d/c today, will continue to monitor for signs / symptoms of continued alcoholic withdrawal. Raynald BlendSteven M Rayfield Beem

## 2017-12-25 NOTE — Clinical Social Work Note (Signed)
CSW spoke with patient again about SNF rehab. CSW explained that patient will have to sign over disability check and stay for 30 days. Patient debated several times but ultimately decided that he does not want to go to SNF and will be going home. CSW notified RN CM of decision. CSW signing off. Please re consult if further needs arise.   Ruthe Mannanandace Palin Tristan MSW, [address]CSWA [phone]

## 2017-12-25 NOTE — Discharge Instructions (Signed)
It was a pleasure meeting you during your hospitalization!  You came into the hospital for alcohol withdrawal symptoms. I have prescribed Ativan to help you safely come off the alcohol. You should take Ativan as follows: - take 1 tablet twice a day for 1 week - take 1/2 tablet twice a day for 1 week - take 1/2 tablet once a daily for 1 week  Please make sure you schedule an appointment with your primary care doctor as soon as possible. You will need a referral to a pain clinic.  -Dr. Nancy MarusMayo

## 2017-12-25 NOTE — Care Management (Signed)
Declines discussion with CM

## 2017-12-25 NOTE — [place] FL2 (Signed)
[place] MEDICAID FL2 LEVEL OF CARE SCREENING TOOL     IDENTIFICATION  Patient Name: Kurt Horton Birthdate: 04/11/1961 Sex: male Admission Date (Current Location): 12/22/2017  [place]   Facility and Address:  [facility], [address], [place], [place] [address]      Provider Number: [id number]  Attending Physician Name and Address:  Campbell StallMayo, Katy Dodd, MD  Relative Name and Phone Number:       Current Level of Care: Hospital Recommended Level of Care: Skilled Nursing Facility Prior Approval Number:    Date Approved/Denied:   PASRR Number: [id number]  Discharge Plan: SNF    Current Diagnoses: Patient Active Problem List   Diagnosis Date Noted  . Alcohol intoxication (HCC) 12/22/2017  . Malnutrition of moderate degree 03/30/2018  . Chronic combined systolic and diastolic CHF (congestive heart failure) (HCC) 08/13/2017  . Alcohol withdrawal delirium (HCC) 02/26/2017  . Atrial fibrillation with RVR (HCC) 02/25/2017  . Acute alcoholic intoxication with complication (HCC)   . Alcohol abuse   . Alcohol withdrawal (HCC) 11/13/2016  . Substance induced mood disorder (HCC) 11/13/2016  . SVT (supraventricular tachycardia) (HCC) 11/12/2016  . HTN (hypertension) 11/12/2016  . EtOH dependence (HCC) 11/12/2016  . Lumbar radiculopathy 08/24/2016  . Depression with anxiety 08/24/2016  . Chronic low back pain 01/04/2016  . Pressure ulcer 12/29/2015  . Osteomyelitis (HCC) 12/26/2015  . PVD (peripheral vascular disease) (HCC) 12/26/2015  . Type II diabetes mellitus with manifestations (HCC) 12/26/2015  . Myasthenia gravis (HCC) 12/26/2015    Orientation RESPIRATION BLADDER Height & Weight     Self, Time, Place, Situation  Normal Continent Weight: 186 lb 9.6 oz (84.6 kg) Height:  5\' 11"  (180.3 cm)  BEHAVIORAL SYMPTOMS/MOOD NEUROLOGICAL BOWEL NUTRITION STATUS  (none) (None) Continent Diet(Heart Healthy )  AMBULATORY  STATUS COMMUNICATION OF NEEDS Skin   Extensive Assist Verbally Normal                       Personal Care Assistance Level of Assistance  Bathing, Feeding, Dressing Bathing Assistance: Limited assistance Feeding assistance: Independent Dressing Assistance: Limited assistance     Functional Limitations Info  Sight, Hearing, Speech Sight Info: Adequate Hearing Info: Adequate Speech Info: Adequate    SPECIAL CARE FACTORS FREQUENCY  PT (By licensed PT), OT (By licensed OT)                    Contractures Contractures Info: Not present    Additional Factors Info  Code Status, Allergies Code Status Info: Full Code  Allergies Info: Nsaids            Current Medications (12/25/2017):  This is the current hospital active medication list Current Facility-Administered Medications  Medication Dose Route Frequency Provider Last Rate Last Dose  . acetaminophen (TYLENOL) tablet 650 mg  650 mg Oral Q6H PRN Mayo, Allyn KennerKaty Dodd, MD      . amiodarone (PACERONE) tablet 200 mg  200 mg Oral Daily Barbaraann RondoSridharan, Prasanna, MD   200 mg at 12/25/17 0919  . aspirin EC tablet 325 mg  325 mg Oral Daily Barbaraann RondoSridharan, Prasanna, MD   325 mg at 12/25/17 0919  . bisacodyl (DULCOLAX) EC tablet 5 mg  5 mg Oral Daily PRN Barbaraann RondoSridharan, Prasanna, MD      . citalopram (CELEXA) tablet 20 mg  20 mg Oral Daily Barbaraann RondoSridharan, Prasanna, MD   20 mg at 12/25/17 0919  . diazepam (VALIUM) tablet 10 mg  10 mg Oral Q6H PRN Barbaraann RondoSridharan, Prasanna, MD   10 mg at 12/25/17 1113  . diltiazem (CARDIZEM CD) 24 hr capsule 180 mg  180 mg Oral Daily Barbaraann RondoSridharan, Prasanna, MD   180 mg at 12/25/17 0919  . enoxaparin (LOVENOX) injection 40 mg  40 mg Subcutaneous Q24H Barbaraann RondoSridharan, Prasanna, MD   40 mg at 12/24/17 2134  . folic acid (FOLVITE) tablet 1 mg  1 mg Oral Daily Barbaraann RondoSridharan, Prasanna, MD   1 mg at 12/25/17 0919  . gabapentin (NEURONTIN) capsule 600 mg  600 mg Oral TID Barbaraann RondoSridharan, Prasanna, MD   600 mg at 12/25/17 1513  . insulin aspart  (novoLOG) injection 0-9 Units  0-9 Units Subcutaneous TID WC Barbaraann RondoSridharan, Prasanna, MD   1 Units at 12/25/17 0809  . lidocaine (LIDODERM) 5 % 1 patch  1 patch Transdermal Q24H Barbaraann RondoSridharan, Prasanna, MD   1 patch at 12/25/17 [phone]  . loperamide (IMODIUM) capsule 2 mg  2 mg Oral Q6H PRN Shaune Pollackhen, Qing, MD   2 mg at 12/22/17 2122  . LORazepam (ATIVAN) injection 0-4 mg  0-4 mg Intravenous Q12H Barbaraann RondoSridharan, Prasanna, MD   1 mg at 12/24/17 1717  . metoprolol succinate (TOPROL-XL) 24 hr tablet 50 mg  50 mg Oral Daily Barbaraann RondoSridharan, Prasanna, MD   50 mg at 12/25/17 0919  . multivitamin with minerals tablet 1 tablet  1 tablet Oral Daily Barbaraann RondoSridharan, Prasanna, MD   1 tablet at 12/25/17 0919  . mycophenolate (CELLCEPT) capsule 1,000 mg  1,000 mg Oral QHS Barbaraann RondoSridharan, Prasanna, MD   1,000 mg at 12/24/17 2134  . mycophenolate (CELLCEPT) capsule 500 mg  500 mg Oral q morning - 10a Marjie SkiffSridharan, Prasanna, MD   500 mg at 12/25/17 0919  . nicotine (NICODERM CQ - dosed in mg/24 hours) patch 14 mg  14 mg Transdermal q1800 Campbell StallMayo, Katy Dodd, MD   14 mg at 12/24/17 1715  . nicotine polacrilex (NICORETTE) gum 2 mg  2 mg Oral PRN Cammy CopaMaier, Angela, MD      . oxyCODONE (Oxy IR/ROXICODONE) immediate release tablet 10 mg  10 mg Oral Q4H PRN Barbaraann RondoSridharan, Prasanna, MD   10 mg at 12/25/17 1247  . senna-docusate (Senokot-S) tablet 1 tablet  1 tablet Oral QHS PRN Barbaraann RondoSridharan, Prasanna, MD      . thiamine (VITAMIN B-1) tablet 100 mg  100 mg Oral Daily Barbaraann RondoSridharan, Prasanna, MD   100 mg at 12/25/17 [id number]   Or  . thiamine (B-1) injection 100 mg  100 mg Intravenous Daily Barbaraann RondoSridharan, Prasanna, MD         Discharge Medications: Please see discharge summary for a list of discharge medications.  Relevant Imaging Results:  Relevant Lab Results:   Additional Information SSN: [id number]  Ruthe MannanCandace  Catelyn Friel, [place]

## 2018-01-19 ENCOUNTER — Emergency Department: Payer: Medicaid Other

## 2018-01-19 ENCOUNTER — Inpatient Hospital Stay
Admission: EM | Admit: 2018-01-19 | Discharge: 2018-01-22 | DRG: 897 | Disposition: A | Discharge status: Home Health | Payer: Medicaid Other | Attending: Internal Medicine | Admitting: Internal Medicine

## 2018-01-19 DIAGNOSIS — Z79891 Long term (current) use of opiate analgesic: Secondary | ICD-10-CM

## 2018-01-19 DIAGNOSIS — I959 Hypotension, unspecified: Secondary | ICD-10-CM | POA: Diagnosis present

## 2018-01-19 DIAGNOSIS — Z9114 Patient's other noncompliance with medication regimen: Secondary | ICD-10-CM

## 2018-01-19 DIAGNOSIS — E876 Hypokalemia: Secondary | ICD-10-CM | POA: Diagnosis present

## 2018-01-19 DIAGNOSIS — E1151 Type 2 diabetes mellitus with diabetic peripheral angiopathy without gangrene: Secondary | ICD-10-CM | POA: Diagnosis present

## 2018-01-19 DIAGNOSIS — R740 Nonspecific elevation of levels of transaminase and lactic acid dehydrogenase [LDH]: Secondary | ICD-10-CM | POA: Diagnosis present

## 2018-01-19 DIAGNOSIS — Z7984 Long term (current) use of oral hypoglycemic drugs: Secondary | ICD-10-CM

## 2018-01-19 DIAGNOSIS — I110 Hypertensive heart disease with heart failure: Secondary | ICD-10-CM | POA: Diagnosis present

## 2018-01-19 DIAGNOSIS — R748 Abnormal levels of other serum enzymes: Secondary | ICD-10-CM | POA: Diagnosis present

## 2018-01-19 DIAGNOSIS — Z89422 Acquired absence of other left toe(s): Secondary | ICD-10-CM

## 2018-01-19 DIAGNOSIS — M48061 Spinal stenosis, lumbar region without neurogenic claudication: Secondary | ICD-10-CM | POA: Diagnosis present

## 2018-01-19 DIAGNOSIS — F10931 Alcohol use, unspecified with withdrawal delirium: Secondary | ICD-10-CM

## 2018-01-19 DIAGNOSIS — Y908 Blood alcohol level of 240 mg/100 ml or more: Secondary | ICD-10-CM | POA: Diagnosis present

## 2018-01-19 DIAGNOSIS — J9690 Respiratory failure, unspecified, unspecified whether with hypoxia or hypercapnia: Secondary | ICD-10-CM

## 2018-01-19 DIAGNOSIS — F17210 Nicotine dependence, cigarettes, uncomplicated: Secondary | ICD-10-CM | POA: Diagnosis present

## 2018-01-19 DIAGNOSIS — E1165 Type 2 diabetes mellitus with hyperglycemia: Secondary | ICD-10-CM | POA: Diagnosis present

## 2018-01-19 DIAGNOSIS — F10939 Alcohol use, unspecified with withdrawal, unspecified: Secondary | ICD-10-CM | POA: Diagnosis present

## 2018-01-19 DIAGNOSIS — Z79899 Other long term (current) drug therapy: Secondary | ICD-10-CM

## 2018-01-19 DIAGNOSIS — Z7982 Long term (current) use of aspirin: Secondary | ICD-10-CM

## 2018-01-19 DIAGNOSIS — D709 Neutropenia, unspecified: Secondary | ICD-10-CM | POA: Diagnosis present

## 2018-01-19 DIAGNOSIS — I480 Paroxysmal atrial fibrillation: Secondary | ICD-10-CM | POA: Diagnosis present

## 2018-01-19 DIAGNOSIS — G7000 Myasthenia gravis without (acute) exacerbation: Secondary | ICD-10-CM | POA: Diagnosis present

## 2018-01-19 DIAGNOSIS — F418 Other specified anxiety disorders: Secondary | ICD-10-CM | POA: Diagnosis present

## 2018-01-19 DIAGNOSIS — R911 Solitary pulmonary nodule: Secondary | ICD-10-CM | POA: Diagnosis present

## 2018-01-19 DIAGNOSIS — W19XXXA Unspecified fall, initial encounter: Secondary | ICD-10-CM | POA: Diagnosis present

## 2018-01-19 DIAGNOSIS — Z888 Allergy status to other drugs, medicaments and biological substances status: Secondary | ICD-10-CM

## 2018-01-19 DIAGNOSIS — I5032 Chronic diastolic (congestive) heart failure: Secondary | ICD-10-CM | POA: Diagnosis present

## 2018-01-19 DIAGNOSIS — F1020 Alcohol dependence, uncomplicated: Secondary | ICD-10-CM

## 2018-01-19 DIAGNOSIS — Z886 Allergy status to analgesic agent status: Secondary | ICD-10-CM

## 2018-01-19 DIAGNOSIS — D696 Thrombocytopenia, unspecified: Secondary | ICD-10-CM | POA: Diagnosis present

## 2018-01-19 DIAGNOSIS — F10239 Alcohol dependence with withdrawal, unspecified: Secondary | ICD-10-CM | POA: Diagnosis present

## 2018-01-19 DIAGNOSIS — G9349 Other encephalopathy: Secondary | ICD-10-CM | POA: Diagnosis present

## 2018-01-19 DIAGNOSIS — G8929 Other chronic pain: Secondary | ICD-10-CM | POA: Diagnosis present

## 2018-01-19 DIAGNOSIS — F10231 Alcohol dependence with withdrawal delirium: Principal | ICD-10-CM | POA: Diagnosis present

## 2018-01-19 DIAGNOSIS — R159 Full incontinence of feces: Secondary | ICD-10-CM | POA: Diagnosis present

## 2018-01-19 LAB — URINALYSIS, COMPLETE (UACMP) WITH MICROSCOPIC
BACTERIA UA: NONE SEEN
BILIRUBIN URINE: NEGATIVE
Glucose, UA: NEGATIVE mg/dL
HGB URINE DIPSTICK: NEGATIVE
KETONES UR: NEGATIVE mg/dL
LEUKOCYTES UA: NEGATIVE
NITRITE: NEGATIVE
PH: 6 (ref 5.0–8.0)
Protein, ur: NEGATIVE mg/dL
Specific Gravity, Urine: 1.008 (ref 1.005–1.030)

## 2018-01-19 LAB — SALICYLATE LEVEL: Salicylate Lvl: <7 mg/dL (ref 2.8–30.0)

## 2018-01-19 LAB — CBC WITH DIFFERENTIAL/PLATELET
Basophils Absolute: 0.1 10*3/uL (ref 0–0.1)
Basophils Relative: 4 %
EOS ABS: 0 10*3/uL (ref 0–0.7)
Eosinophils Relative: 1 %
HEMATOCRIT: 42.2 % (ref 40.0–52.0)
HEMOGLOBIN: 13.8 g/dL (ref 13.0–18.0)
LYMPHS ABS: 1.3 10*3/uL (ref 1.0–3.6)
LYMPHS PCT: 47 %
MCH: 26.9 pg (ref 26.0–34.0)
MCHC: 32.6 g/dL (ref 32.0–36.0)
MCV: 82.3 fL (ref 80.0–100.0)
MONOS PCT: 15 %
Monocytes Absolute: 0.4 10*3/uL (ref 0.2–1.0)
NEUTROS ABS: 0.9 10*3/uL — AB (ref 1.4–6.5)
NEUTROS PCT: 33 %
PLATELETS: 113 10*3/uL — AB (ref 150–440)
RBC: 5.13 MIL/uL (ref 4.40–5.90)
RDW: 22.3 % — AB (ref 11.5–14.5)
WBC: 2.8 10*3/uL — AB (ref 3.8–10.6)

## 2018-01-19 LAB — URINE DRUG SCREEN, QUALITATIVE (ARMC ONLY)
Amphetamines, Ur Screen: NOT DETECTED
BARBITURATES, UR SCREEN: NOT DETECTED
BENZODIAZEPINE, UR SCRN: POSITIVE — AB
CANNABINOID 50 NG, UR ~~LOC~~: NOT DETECTED
COCAINE METABOLITE, UR ~~LOC~~: NOT DETECTED
MDMA (Ecstasy)Ur Screen: NOT DETECTED
Methadone Scn, Ur: NOT DETECTED
OPIATE, UR SCREEN: NOT DETECTED
PHENCYCLIDINE (PCP) UR S: NOT DETECTED
Tricyclic, Ur Screen: NOT DETECTED

## 2018-01-19 LAB — COMPREHENSIVE METABOLIC PANEL
ALBUMIN: 4.4 g/dL (ref 3.5–5.0)
ALT: 24 U/L (ref 0–44)
ANION GAP: 13 (ref 5–15)
AST: 52 U/L — AB (ref 15–41)
Alkaline Phosphatase: 92 U/L (ref 38–126)
BUN: 6 mg/dL (ref 6–20)
CHLORIDE: 104 mmol/L (ref 98–111)
CO2: 28 mmol/L (ref 22–32)
Calcium: 8.8 mg/dL — ABNORMAL LOW (ref 8.9–10.3)
Creatinine, Ser: 0.8 mg/dL (ref 0.61–1.24)
GFR calc Af Amer: >60 mL/min (ref >60)
GLUCOSE: 137 mg/dL — AB (ref 70–99)
Potassium: 3 mmol/L — ABNORMAL LOW (ref 3.5–5.1)
Sodium: 145 mmol/L (ref 135–145)
Total Bilirubin: 0.6 mg/dL (ref 0.3–1.2)
Total Protein: 7.8 g/dL (ref 6.5–8.1)

## 2018-01-19 LAB — ETHANOL: ALCOHOL ETHYL (B): 317 mg/dL — AB (ref <10)

## 2018-01-19 LAB — CK: Total CK: 133 U/L (ref 49–397)

## 2018-01-19 LAB — ACETAMINOPHEN LEVEL: Acetaminophen (Tylenol), Serum: <10 ug/mL — ABNORMAL LOW (ref 10–30)

## 2018-01-19 MED ORDER — LORAZEPAM 2 MG/ML IJ SOLN
0.0000 mg | Freq: Four times a day (QID) | INTRAMUSCULAR | Status: DC
  Administered 2018-01-19 (×2): 1 mg via INTRAVENOUS
  Filled 2018-01-19 (×2): qty 1

## 2018-01-19 MED ORDER — LOPERAMIDE HCL 2 MG PO CAPS
ORAL_CAPSULE | ORAL | Status: AC
  Administered 2018-01-20: 4 mg via ORAL
  Filled 2018-01-19: qty 2

## 2018-01-19 MED ORDER — ACETAMINOPHEN 325 MG PO TABS
650.0000 mg | ORAL_TABLET | Freq: Once | ORAL | Status: AC
  Administered 2018-01-19: 650 mg via ORAL

## 2018-01-19 MED ORDER — NICOTINE 21 MG/24HR TD PT24
21.0000 mg | MEDICATED_PATCH | Freq: Every day | TRANSDERMAL | Status: DC
  Administered 2018-01-20 – 2018-01-22 (×3): 21 mg via TRANSDERMAL
  Filled 2018-01-19 (×3): qty 1

## 2018-01-19 MED ORDER — DILTIAZEM HCL ER COATED BEADS 180 MG PO CP24
180.0000 mg | ORAL_CAPSULE | Freq: Every day | ORAL | Status: DC
  Filled 2018-01-19 (×2): qty 1

## 2018-01-19 MED ORDER — FOLIC ACID 1 MG PO TABS
1.0000 mg | ORAL_TABLET | Freq: Every day | ORAL | Status: DC
  Administered 2018-01-20 – 2018-01-22 (×3): 1 mg via ORAL
  Filled 2018-01-19 (×3): qty 1

## 2018-01-19 MED ORDER — NICOTINE 14 MG/24HR TD PT24
14.0000 mg | MEDICATED_PATCH | Freq: Once | TRANSDERMAL | Status: AC
  Administered 2018-01-19: 14 mg via TRANSDERMAL
  Filled 2018-01-19: qty 1

## 2018-01-19 MED ORDER — LORAZEPAM 2 MG PO TABS: 0.0000 mg | ORAL_TABLET | Freq: Four times a day (QID) | ORAL | Status: DC

## 2018-01-19 MED ORDER — VITAMIN B-1 100 MG PO TABS
100.0000 mg | ORAL_TABLET | Freq: Every day | ORAL | Status: DC
  Administered 2018-01-20 – 2018-01-22 (×2): 100 mg via ORAL
  Filled 2018-01-19 (×4): qty 1

## 2018-01-19 MED ORDER — CITALOPRAM HYDROBROMIDE 20 MG PO TABS
20.0000 mg | ORAL_TABLET | Freq: Every day | ORAL | Status: DC
  Administered 2018-01-20 – 2018-01-22 (×3): 20 mg via ORAL
  Filled 2018-01-19 (×3): qty 1

## 2018-01-19 MED ORDER — AMIODARONE HCL 200 MG PO TABS
200.0000 mg | ORAL_TABLET | Freq: Every day | ORAL | Status: DC
  Administered 2018-01-20 – 2018-01-22 (×3): 200 mg via ORAL
  Filled 2018-01-19 (×4): qty 1

## 2018-01-19 MED ORDER — METOPROLOL SUCCINATE ER 50 MG PO TB24
50.0000 mg | ORAL_TABLET | Freq: Every day | ORAL | Status: DC
  Administered 2018-01-20 – 2018-01-22 (×3): 50 mg via ORAL
  Filled 2018-01-19 (×3): qty 1

## 2018-01-19 MED ORDER — THIAMINE HCL 100 MG/ML IJ SOLN
100.0000 mg | Freq: Every day | INTRAMUSCULAR | Status: DC
  Administered 2018-01-19 – 2018-01-21 (×2): 100 mg via INTRAVENOUS
  Filled 2018-01-19 (×2): qty 2

## 2018-01-19 MED ORDER — VITAMIN B-1 100 MG PO TABS: 100.0000 mg | ORAL_TABLET | Freq: Every day | ORAL | Status: DC

## 2018-01-19 MED ORDER — GABAPENTIN 300 MG PO CAPS
600.0000 mg | ORAL_CAPSULE | Freq: Three times a day (TID) | ORAL | Status: DC
  Administered 2018-01-20 – 2018-01-22 (×5): 600 mg via ORAL
  Filled 2018-01-19 (×7): qty 2

## 2018-01-19 MED ORDER — LORAZEPAM 2 MG PO TABS: 0.0000 mg | ORAL_TABLET | Freq: Two times a day (BID) | ORAL | Status: DC

## 2018-01-19 MED ORDER — FENTANYL 75 MCG/HR TD PT72: 75.0000 ug | MEDICATED_PATCH | TRANSDERMAL | Status: DC

## 2018-01-19 MED ORDER — ACETAMINOPHEN 325 MG PO TABS
ORAL_TABLET | ORAL | Status: AC
  Filled 2018-01-19: qty 2

## 2018-01-19 MED ORDER — MYCOPHENOLATE MOFETIL 250 MG PO CAPS
500.0000 mg | ORAL_CAPSULE | Freq: Two times a day (BID) | ORAL | Status: DC
  Administered 2018-01-21 – 2018-01-22 (×2): 500 mg via ORAL
  Filled 2018-01-19 (×7): qty 2

## 2018-01-19 MED ORDER — METFORMIN HCL 500 MG PO TABS: 500.0000 mg | ORAL_TABLET | Freq: Every day | ORAL | Status: DC

## 2018-01-19 MED ORDER — LORAZEPAM 2 MG/ML IJ SOLN: 0.0000 mg | Freq: Two times a day (BID) | INTRAMUSCULAR | Status: DC

## 2018-01-19 MED ORDER — SODIUM CHLORIDE 0.9 % IV BOLUS
1000.0000 mL | Freq: Once | INTRAVENOUS | Status: AC
  Administered 2018-01-19: 1000 mL via INTRAVENOUS

## 2018-01-19 NOTE — ED Notes (Signed)
TTS has attempted to assess the pt. Pt is severely intoxicated, BAL 317 @ 1pm. Pt unsure about requesting asisstance with detox and or recovery at this time. Discussed with the attending provider (Dr. Pershing ProudSchaevitz) and pt is to be assess when sober.

## 2018-01-19 NOTE — ED Notes (Signed)
Pt incontinent of stool, cleaned up.

## 2018-01-19 NOTE — ED Provider Notes (Signed)
[facility] Emergency Department Provider Note ____________________________________________   First MD Initiated Contact with Patient 01/19/18 1235     (approximate)  I have reviewed the triage vital signs and the nursing notes.   HISTORY  Chief Complaint Fall  HPI Kurt Horton is a 57 y.o. male with a history of myasthenia gravis as well as alcohol abuse was presenting to the emergency department today for complications related to alcohol.  He says that he fell yesterday and has been lying on the ground at least all morning.  Says that his last drink was approximately 7:30 AM.  He was found lying in his own stool.  Says that during the fall yesterday hit the left side of his head as well as left shoulder.  Says that he is noncompliant with all of his medications.  Says that he drinks as much she can get, all day every day.  Denies any other drug use.   Past Medical History:  Diagnosis Date  . A-fib (HCC)   . Alcohol abuse   . Alcohol withdrawal (HCC) 11/12/2016  . Diabetes mellitus without complication (HCC)   . DJD (degenerative joint disease)   . Hypertension   . Myasthenia gravis (HCC)   . Renal disorder     Patient Active Problem List   Diagnosis Date Noted  . Alcohol intoxication (HCC) 12/22/2017  . Malnutrition of moderate degree 10/04/2017  . Chronic combined systolic and diastolic CHF (congestive heart failure) (HCC) 08/13/2017  . Alcohol withdrawal delirium (HCC) 02/26/2017  . Atrial fibrillation with RVR (HCC) 02/25/2017  . Acute alcoholic intoxication with complication (HCC)   . Alcohol abuse   . Alcohol withdrawal (HCC) 11/13/2016  . Substance induced mood disorder (HCC) 11/13/2016  . SVT (supraventricular tachycardia) (HCC) 11/12/2016  . HTN (hypertension) 11/12/2016  . EtOH dependence (HCC) 11/12/2016  . Lumbar radiculopathy 08/24/2016  . Depression with anxiety 08/24/2016  . Chronic low back pain 01/04/2016  . Pressure ulcer  12/29/2015  . Osteomyelitis (HCC) 12/26/2015  . PVD (peripheral vascular disease) (HCC) 12/26/2015  . Type II diabetes mellitus with manifestations (HCC) 12/26/2015  . Myasthenia gravis (HCC) 12/26/2015    Past Surgical History:  Procedure Laterality Date  . AMPUTATION TOE Left 12/29/2015   Procedure: AMPUTATION TOE;  Surgeon: Gwyneth Revels, DPM;  Location: [facility] ORS;  Service: Podiatry;  Laterality: Left;  . PERIPHERAL VASCULAR CATHETERIZATION N/A 12/27/2015   Procedure: Lower Extremity Angiography;  Surgeon: Annice Needy, MD;  Location: [facility] INVASIVE CV LAB;  Service: Cardiovascular;  Laterality: N/A;  . right hip surgery    . right shoulder surgery      Prior to Admission medications   Medication Sig Start Date End Date Taking? Authorizing Provider  acetaminophen (TYLENOL) 325 MG tablet Take 2 tablets (650 mg total) by mouth every 6 (six) hours as needed for mild pain (or Fever >/= 101). 11/02/17   Alford Highland, MD  amiodarone (PACERONE) 200 MG tablet Take 1 tablet (200 mg total) by mouth daily. 11/02/17   Alford Highland, MD  aspirin 325 MG EC tablet Take 1 tablet (325 mg total) by mouth daily. 11/02/17   Alford Highland, MD  B Complex-C (B-COMPLEX WITH VITAMIN C) tablet Take 1 tablet by mouth daily. 11/02/17   Alford Highland, MD  bisacodyl (DULCOLAX) 5 MG EC tablet Take 1 tablet (5 mg total) by mouth daily as needed for moderate constipation. 12/25/17   Mayo, Allyn Kenner, MD  citalopram (CELEXA) 20 MG tablet Take 1  tablet (20 mg total) by mouth daily. 11/02/17   Alford Highland, MD  diltiazem (CARDIZEM CD) 180 MG 24 hr capsule Take 1 capsule (180 mg total) by mouth daily. 11/02/17 03/02/18  Alford Highland, MD  fentaNYL (DURAGESIC - DOSED MCG/HR) 75 MCG/HR Place 1 patch (75 mcg total) onto the skin every 3 (three) days. 12/25/17   Mayo, Allyn Kenner, MD  folic acid (FOLVITE) 1 MG tablet Take 1 tablet (1 mg total) by mouth daily. 12/26/17   Mayo, Allyn Kenner, MD  gabapentin (NEURONTIN) 300 MG  capsule Take 2 capsules (600 mg total) by mouth 3 (three) times daily. 11/02/17   Alford Highland, MD  lidocaine (LIDODERM) 5 % Place 1 patch onto the skin daily. Remove & Discard patch within 12 hours or as directed by MD 11/02/17   Alford Highland, MD  LORazepam (ATIVAN) 1 MG tablet Take 1 tablet by mouth twice daily for 1 week, then take 1/2 tablet (0.5mg ) by mouth twice daily for 1 week, then take 1/2 tablet (0.5mg ) by mouth once daily for 1 week 12/25/17   Mayo, Allyn Kenner, MD  metFORMIN (GLUCOPHAGE) 500 MG tablet Take 1 tablet (500 mg total) by mouth daily. 11/02/17   Alford Highland, MD  metoprolol succinate (TOPROL-XL) 50 MG 24 hr tablet Take 1 tablet (50 mg total) by mouth daily. Take with or immediately following a meal. 11/02/17   Alford Highland, MD  multivitamin (ONE-A-DAY MEN'S) TABS tablet Take 1 tablet by mouth daily. 11/02/17   Alford Highland, MD  mycophenolate (CELLCEPT) 500 MG tablet Take 1 tablet (500 mg total) by mouth 2 (two) times daily. Take 1 tablet (500MG ) by mouth every morning and 2 tablets (1000MG ) by mouth every evening 11/02/17   Alford Highland, MD  nicotine (NICODERM CQ - DOSED IN MG/24 HOURS) 14 mg/24hr patch Place 1 patch (14 mg total) onto the skin daily. Okay to substitute generic 11/02/17   Alford Highland, MD  oxyCODONE (ROXICODONE) 15 MG immediate release tablet Take 1 tablet (15 mg total) by mouth every 8 (eight) hours as needed for pain. 12/25/17   Mayo, Allyn Kenner, MD  senna-docusate (SENOKOT-S) 8.6-50 MG tablet Take 1 tablet by mouth at bedtime as needed for mild constipation. 12/25/17   Mayo, Allyn Kenner, MD  thiamine 100 MG tablet Take 1 tablet (100 mg total) by mouth daily. 11/02/17   Alford Highland, MD    Allergies Nsaids and Other  Family History  Problem Relation Age of Onset  . Rheum arthritis Father     Social History Social History   Tobacco Use  . Smoking status: Current Every Day Smoker    Packs/day: 0.50    Types: Cigarettes  . Smokeless  tobacco: Never Used  Substance Use Topics  . Alcohol use: Yes    Comment: Pt had tequila and rum, at least a 5th or a pint a day  . Drug use: No    Review of Systems  Constitutional: No fever/chills Eyes: No visual changes. ENT: No sore throat. Cardiovascular: Denies chest pain. Respiratory: Denies shortness of breath. Gastrointestinal: No abdominal pain.  No nausea, no vomiting.  No diarrhea.  No constipation. Genitourinary: Negative for dysuria. Musculoskeletal: Negative for back pain. Skin: Negative for rash. Neurological: Negative for focal weakness or numbness.   ____________________________________________   PHYSICAL EXAM:  VITAL SIGNS: ED Triage Vitals  Enc Vitals Group     BP      Pulse      Resp      Temp  Temp src      SpO2      Weight      Height      Head Circumference      Peak Flow      Pain Score      Pain Loc      Pain Edu?      Excl. in GC?     Constitutional: Alert and oriented.  Ill and weak appearing.  Patient soiled, in his own stool.  Stool is grossly brown. Eyes: Conjunctivae are normal.  Head: Ecchymosis over the left side of the forehead.  No depression or bogginess or swelling. Nose: No congestion/rhinnorhea. Mouth/Throat: Mucous membranes are moist.  Neck: No stridor.  No tenderness to palpation to the cervical spine.  No deformity or step-off. Cardiovascular: Normal rate, regular rhythm. Grossly normal heart sounds.   Respiratory: Normal respiratory effort.  No retractions. Lungs CTAB. Gastrointestinal: Soft and nontender. No distention. No CVA tenderness. Musculoskeletal: No lower extremity tenderness nor edema.  No joint effusions.  5 out of 5 strength to bilateral lower extremities.  Left shoulder with ecchymosis and pain with active range of motion.  However, there is no deformity.  Mild to moderate diffuse tenderness to palpation.   Neurologic:  Normal speech and language. No gross focal neurologic deficits are appreciated.   1 x 12 cm strip of yellow ecchymotic area approximately at the level of the 10th and 11th ribs on the left side. Skin:  Skin is warm, dry and intact. No rash noted. Psychiatric: Mood and affect are normal. Speech and behavior are normal.  ____________________________________________   LABS (all labs ordered are listed, but only abnormal results are displayed)  Labs Reviewed  CBC WITH DIFFERENTIAL/PLATELET - Abnormal; Notable for the following components:      Result Value   WBC 2.8 (*)    RDW 22.3 (*)    Platelets 113 (*)    Neutro Abs 0.9 (*)    All other components within normal limits  COMPREHENSIVE METABOLIC PANEL - Abnormal; Notable for the following components:   Potassium 3.0 (*)    Glucose, Bld 137 (*)    Calcium 8.8 (*)    AST 52 (*)    All other components within normal limits  ETHANOL - Abnormal; Notable for the following components:   Alcohol, Ethyl (B) 317 (*)    All other components within normal limits  URINALYSIS, COMPLETE (UACMP) WITH MICROSCOPIC - Abnormal; Notable for the following components:   Color, Urine YELLOW (*)    APPearance CLEAR (*)    All other components within normal limits  URINE DRUG SCREEN, QUALITATIVE ([facility] ONLY) - Abnormal; Notable for the following components:   Benzodiazepine, Ur Scrn POSITIVE (*)    All other components within normal limits  ACETAMINOPHEN LEVEL - Abnormal; Notable for the following components:   Acetaminophen (Tylenol), Serum <10 (*)    All other components within normal limits  CK  SALICYLATE LEVEL   ____________________________________________  EKG  ED ECG REPORT I, Arelia Longest, the attending physician, personally viewed and interpreted this ECG.  ____________________________________________  RADIOLOGY  No fracture or dislocation of the left shoulder.  No acute finding to the CT the brain.CT chest with irregular nodular density within the left lower lobe.  1.3 cm.  22-month follow-up  recommended. ____________________________________________   PROCEDURES  Procedure(s) performed:   Procedures  Critical Care performed:   ____________________________________________   INITIAL IMPRESSION / ASSESSMENT AND PLAN / ED COURSE  Pertinent labs &  imaging results that were available during my care of the patient were reviewed by me and considered in my medical decision making (see chart for details).  DDX: Fall, cranial hemorrhage, skull fracture, alcoholism, alcohol withdrawal, alcohol intoxication, rhabdomyolysis, renal failure As part of my medical decision making, I reviewed the following data within the electronic MEDICAL RECORD NUMBER Notes from prior ED visits   ----------------------------------------- 9:02 PM on 01/19/2018 -----------------------------------------  Patient gave me permission to discuss his case with his parents, with whom he lives.  I discussed the case with the patient's father, Kurt Horton, who reports that the patient is highly dependent on his parents and that he and his mother are both in their 3780s.  Patient's father states that they have been trying to explore options for a group home or another higher level of care.  Patient has been refusing to leave and I believe that sending him home will not be a safe discharge at this time.  He will have a social work Administrator, sportsconsultation.  CIWA protocol in place. ____________________________________________   FINAL CLINICAL IMPRESSION(S) / ED DIAGNOSES  Alcoholism.  Pulmonary nodule.  NEW MEDICATIONS STARTED DURING THIS VISIT:  New Prescriptions   No medications on file     Note:  This document was prepared using Dragon voice recognition software and may include unintentional dictation errors.     Myrna BlazerSchaevitz, David Matthew, MD 01/19/18 2104

## 2018-01-19 NOTE — ED Notes (Signed)
ED Provider at bedside. 

## 2018-01-19 NOTE — ED Notes (Signed)
Pt flagged down this nurse walking by room; pt says he's incontinent of urine and stool; bed moved over beside toilet; assisted x 2 to toilet in room; pt unsteady/shaky on his feet; pt was able to clean his front, this nurse cleaned his bottom; assisted x 2 back to clean bed (changed by other nurse while pt was on the toilet); clean attends in place; pt appreciative; side rails up x 2 with call bell in reach;

## 2018-01-19 NOTE — ED Notes (Signed)
Pt arrives from home via [facility] from home. EMS reports pt found lying on floor covered in stool, complaining he hit his head last night, unknown LOC. Pt reports drinking heavily every day from the time he wakes up until bedtime. Unsure of number of drinks per day. Pt cleaned up of stool upon arrival, protocols initiated.

## 2018-01-19 NOTE — ED Notes (Signed)
Pt to ct 

## 2018-01-19 NOTE — ED Notes (Signed)
Medications all held at this time Per MD until [facility] be verified tomorrow with patient's current pharmacy as patient is unsure what medications and dosages of said medications he is currently taking.

## 2018-01-19 NOTE — ED Notes (Signed)
Pt has been regularly rounded on during stay in ED, pt door has been left open for closer monitoring. Pt room in view of nurses station as high fall risk. [place] to [place], [place]

## 2018-01-19 NOTE — ED Notes (Signed)
TTS consulted with pt regarding SA issues. Discussed patient with ER MD (Dr. Pershing ProudSchaevitz) and patient is able to discharge home when medically cleared. Patient was giving referral information and instructions on how to follow up with Outpatient Treatment ([facility]) and Mobile Crisis.  Writer also advised the patient to call the toll free phone on his insurance card for the counselors and agencies in his network.

## 2018-01-19 NOTE — ED Notes (Signed)
Pt called out asking for bed pan, encouraged pt to get up to toilet with assistance. Pt becomes agitated, stating he would rather poop in the bed and have nurses clean him up. Hunter, RN and I encouraged pt to get up to toilet with assistance. Pt reluctantly does get up and is steady with 1 assist. Pt sits on toilet and then states he no longer needs to go, pt assisted back in bed and then has BM.

## 2018-01-19 NOTE — ED Notes (Signed)
Patient given pillow, another warm blanket, lights dimmed.

## 2018-01-19 NOTE — ED Notes (Signed)
Pt had diarrhea on himself, changed by this RN. pericare provided.

## 2018-01-20 ENCOUNTER — Encounter: Payer: Self-pay | Admitting: Internal Medicine

## 2018-01-20 ENCOUNTER — Emergency Department: Payer: Medicaid Other

## 2018-01-20 DIAGNOSIS — E876 Hypokalemia: Secondary | ICD-10-CM | POA: Diagnosis present

## 2018-01-20 DIAGNOSIS — R740 Nonspecific elevation of levels of transaminase and lactic acid dehydrogenase [LDH]: Secondary | ICD-10-CM | POA: Diagnosis present

## 2018-01-20 DIAGNOSIS — I480 Paroxysmal atrial fibrillation: Secondary | ICD-10-CM | POA: Diagnosis present

## 2018-01-20 DIAGNOSIS — M48061 Spinal stenosis, lumbar region without neurogenic claudication: Secondary | ICD-10-CM | POA: Diagnosis present

## 2018-01-20 DIAGNOSIS — G8929 Other chronic pain: Secondary | ICD-10-CM | POA: Diagnosis present

## 2018-01-20 DIAGNOSIS — I5032 Chronic diastolic (congestive) heart failure: Secondary | ICD-10-CM | POA: Diagnosis present

## 2018-01-20 DIAGNOSIS — R748 Abnormal levels of other serum enzymes: Secondary | ICD-10-CM | POA: Diagnosis present

## 2018-01-20 DIAGNOSIS — G7000 Myasthenia gravis without (acute) exacerbation: Secondary | ICD-10-CM | POA: Diagnosis present

## 2018-01-20 DIAGNOSIS — F418 Other specified anxiety disorders: Secondary | ICD-10-CM | POA: Diagnosis present

## 2018-01-20 DIAGNOSIS — F10231 Alcohol dependence with withdrawal delirium: Secondary | ICD-10-CM | POA: Diagnosis present

## 2018-01-20 DIAGNOSIS — W19XXXA Unspecified fall, initial encounter: Secondary | ICD-10-CM | POA: Diagnosis present

## 2018-01-20 DIAGNOSIS — E1165 Type 2 diabetes mellitus with hyperglycemia: Secondary | ICD-10-CM | POA: Diagnosis present

## 2018-01-20 DIAGNOSIS — I110 Hypertensive heart disease with heart failure: Secondary | ICD-10-CM | POA: Diagnosis present

## 2018-01-20 DIAGNOSIS — I959 Hypotension, unspecified: Secondary | ICD-10-CM | POA: Diagnosis present

## 2018-01-20 DIAGNOSIS — D709 Neutropenia, unspecified: Secondary | ICD-10-CM | POA: Diagnosis present

## 2018-01-20 DIAGNOSIS — R911 Solitary pulmonary nodule: Secondary | ICD-10-CM | POA: Diagnosis present

## 2018-01-20 DIAGNOSIS — Z7982 Long term (current) use of aspirin: Secondary | ICD-10-CM | POA: Diagnosis not present

## 2018-01-20 DIAGNOSIS — D696 Thrombocytopenia, unspecified: Secondary | ICD-10-CM | POA: Diagnosis present

## 2018-01-20 DIAGNOSIS — R159 Full incontinence of feces: Secondary | ICD-10-CM | POA: Diagnosis present

## 2018-01-20 DIAGNOSIS — E1151 Type 2 diabetes mellitus with diabetic peripheral angiopathy without gangrene: Secondary | ICD-10-CM | POA: Diagnosis present

## 2018-01-20 DIAGNOSIS — Z79891 Long term (current) use of opiate analgesic: Secondary | ICD-10-CM | POA: Diagnosis not present

## 2018-01-20 DIAGNOSIS — R4182 Altered mental status, unspecified: Secondary | ICD-10-CM

## 2018-01-20 DIAGNOSIS — Y908 Blood alcohol level of 240 mg/100 ml or more: Secondary | ICD-10-CM | POA: Diagnosis present

## 2018-01-20 DIAGNOSIS — G9349 Other encephalopathy: Secondary | ICD-10-CM | POA: Diagnosis present

## 2018-01-20 DIAGNOSIS — F17210 Nicotine dependence, cigarettes, uncomplicated: Secondary | ICD-10-CM | POA: Diagnosis present

## 2018-01-20 LAB — GLUCOSE, CAPILLARY
GLUCOSE-CAPILLARY: 122 mg/dL — AB (ref 70–99)
Glucose-Capillary: 165 mg/dL — ABNORMAL HIGH (ref 70–99)
Glucose-Capillary: 84 mg/dL (ref 70–99)
Glucose-Capillary: 109 mg/dL — ABNORMAL HIGH (ref 70–99)

## 2018-01-20 LAB — BLOOD GAS, ARTERIAL
ACID-BASE EXCESS: 0.7 mmol/L (ref 0.0–2.0)
Bicarbonate: 24.3 mmol/L (ref 20.0–28.0)
FIO2: 0.35
MECHANICAL RATE: 12
O2 Saturation: 97.1 %
PEEP: 5 cmH2O
PH ART: 7.45 (ref 7.350–7.450)
Patient temperature: 37
pCO2 arterial: 35 mmHg (ref 32.0–48.0)
pO2, Arterial: 87 mmHg (ref 83.0–108.0)

## 2018-01-20 LAB — MAGNESIUM: Magnesium: 1.4 mg/dL — ABNORMAL LOW (ref 1.7–2.4)

## 2018-01-20 LAB — LIPASE, BLOOD: Lipase: 97 U/L — ABNORMAL HIGH (ref 11–51)

## 2018-01-20 LAB — PHOSPHORUS: Phosphorus: 2.9 mg/dL (ref 2.5–4.6)

## 2018-01-20 LAB — LACTIC ACID, PLASMA: Lactic Acid, Venous: 2.6 mmol/L (ref 0.5–1.9)

## 2018-01-20 LAB — MRSA PCR SCREENING: MRSA by PCR: NEGATIVE

## 2018-01-20 LAB — TRIGLYCERIDES: TRIGLYCERIDES: 112 mg/dL (ref <150)

## 2018-01-20 MED ORDER — POTASSIUM CHLORIDE IN NACL 20-0.9 MEQ/L-% IV SOLN
INTRAVENOUS | Status: DC
  Administered 2018-01-20 – 2018-01-21 (×2): via INTRAVENOUS
  Filled 2018-01-20 (×3): qty 1000

## 2018-01-20 MED ORDER — LOPERAMIDE HCL 2 MG PO CAPS
4.0000 mg | ORAL_CAPSULE | Freq: Once | ORAL | Status: AC
  Administered 2018-01-20: 4 mg via ORAL

## 2018-01-20 MED ORDER — LORAZEPAM 2 MG/ML IJ SOLN
0.0000 mg | Freq: Four times a day (QID) | INTRAMUSCULAR | Status: DC
  Administered 2018-01-20 – 2018-01-21 (×2): 1 mg via INTRAVENOUS
  Filled 2018-01-20 (×2): qty 1

## 2018-01-20 MED ORDER — FENTANYL 2500MCG IN NS 250ML (10MCG/ML) PREMIX INFUSION: 25.0000 ug/h | INTRAVENOUS | Status: DC

## 2018-01-20 MED ORDER — ROCURONIUM BROMIDE 50 MG/5ML IV SOLN
50.0000 mg | Freq: Once | INTRAVENOUS | Status: AC
  Administered 2018-01-20: 50 mg via INTRAVENOUS

## 2018-01-20 MED ORDER — LORAZEPAM 2 MG/ML IJ SOLN
8.0000 mg | Freq: Once | INTRAMUSCULAR | Status: AC
  Administered 2018-01-20: 8 mg via INTRAVENOUS

## 2018-01-20 MED ORDER — DILTIAZEM HCL 30 MG PO TABS
60.0000 mg | ORAL_TABLET | Freq: Three times a day (TID) | ORAL | Status: DC
  Administered 2018-01-21: 60 mg via ORAL
  Filled 2018-01-20: qty 2

## 2018-01-20 MED ORDER — PROPOFOL 1000 MG/100ML IV EMUL
50.0000 mg | Freq: Once | INTRAVENOUS | Status: AC
  Administered 2018-01-20: 50 mg via INTRAVENOUS

## 2018-01-20 MED ORDER — IPRATROPIUM-ALBUTEROL 0.5-2.5 (3) MG/3ML IN SOLN
3.0000 mL | Freq: Four times a day (QID) | RESPIRATORY_TRACT | Status: DC
  Administered 2018-01-20 – 2018-01-21 (×6): 3 mL via RESPIRATORY_TRACT
  Filled 2018-01-20 (×6): qty 3

## 2018-01-20 MED ORDER — NOREPINEPHRINE 4 MG/250ML-% IV SOLN
0.0000 ug/min | Freq: Once | INTRAVENOUS | Status: AC
  Administered 2018-01-20: 10 ug/min via INTRAVENOUS

## 2018-01-20 MED ORDER — PROPOFOL BOLUS VIA INFUSION
100.0000 mg | Freq: Once | INTRAVENOUS | Status: AC
  Administered 2018-01-20: 100 mg via INTRAVENOUS

## 2018-01-20 MED ORDER — CHLORHEXIDINE GLUCONATE 0.12 % MT SOLN
OROMUCOSAL | Status: AC
  Filled 2018-01-20: qty 15

## 2018-01-20 MED ORDER — FENTANYL 2500MCG IN NS 250ML (10MCG/ML) PREMIX INFUSION
100.0000 ug/h | INTRAVENOUS | Status: DC
  Administered 2018-01-20 (×2): 100 ug/h via INTRAVENOUS
  Filled 2018-01-20 (×2): qty 250

## 2018-01-20 MED ORDER — ENOXAPARIN SODIUM 40 MG/0.4ML ~~LOC~~ SOLN
40.0000 mg | SUBCUTANEOUS | Status: DC
  Administered 2018-01-21: 40 mg via SUBCUTANEOUS
  Filled 2018-01-20: qty 0.4

## 2018-01-20 MED ORDER — SODIUM CHLORIDE 0.9 % IV BOLUS
1000.0000 mL | Freq: Once | INTRAVENOUS | Status: AC
  Administered 2018-01-20: 1000 mL via INTRAVENOUS

## 2018-01-20 MED ORDER — VITAMIN B-1 100 MG PO TABS
500.0000 mg | ORAL_TABLET | Freq: Once | ORAL | Status: AC
  Administered 2018-01-20: 500 mg via ORAL
  Filled 2018-01-20: qty 5

## 2018-01-20 MED ORDER — FAMOTIDINE IN NACL 20-0.9 MG/50ML-% IV SOLN
20.0000 mg | Freq: Two times a day (BID) | INTRAVENOUS | Status: DC
  Administered 2018-01-20 – 2018-01-21 (×5): 20 mg via INTRAVENOUS
  Filled 2018-01-20 (×6): qty 50

## 2018-01-20 MED ORDER — MIDAZOLAM HCL 2 MG/2ML IJ SOLN: 2.0000 mg | INTRAMUSCULAR | Status: DC | PRN

## 2018-01-20 MED ORDER — INSULIN ASPART 100 UNIT/ML ~~LOC~~ SOLN: 0.0000 [IU] | Freq: Every day | SUBCUTANEOUS | Status: DC

## 2018-01-20 MED ORDER — LORAZEPAM 2 MG/ML IJ SOLN: 1.0000 mg | Freq: Four times a day (QID) | INTRAMUSCULAR | Status: DC | PRN

## 2018-01-20 MED ORDER — SUCCINYLCHOLINE CHLORIDE 20 MG/ML IJ SOLN
160.0000 mg | Freq: Once | INTRAMUSCULAR | Status: AC
  Administered 2018-01-20: 160 mg via INTRAVENOUS

## 2018-01-20 MED ORDER — SODIUM CHLORIDE 0.9 % IV SOLN
250.0000 mL | INTRAVENOUS | Status: DC | PRN
  Administered 2018-01-20: 250 mL via INTRAVENOUS

## 2018-01-20 MED ORDER — PROPOFOL 1000 MG/100ML IV EMUL
20.0000 ug/kg/min | Freq: Once | INTRAVENOUS | Status: AC
  Administered 2018-01-20: 50 ug/kg/min via INTRAVENOUS
  Filled 2018-01-20: qty 100

## 2018-01-20 MED ORDER — MIDAZOLAM HCL 2 MG/2ML IJ SOLN
2.0000 mg | INTRAMUSCULAR | Status: DC | PRN
  Filled 2018-01-20: qty 4

## 2018-01-20 MED ORDER — FENTANYL 75 MCG/HR TD PT72: 75.0000 ug | MEDICATED_PATCH | TRANSDERMAL | Status: DC

## 2018-01-20 MED ORDER — FENTANYL BOLUS VIA INFUSION
200.0000 ug | INTRAVENOUS | Status: AC
  Administered 2018-01-20: 200 ug via INTRAVENOUS

## 2018-01-20 MED ORDER — PROPOFOL BOLUS VIA INFUSION
100.0000 mg | Freq: Once | INTRAVENOUS | Status: AC
  Administered 2018-01-20: 100 mg via INTRAVENOUS
  Filled 2018-01-20: qty 100

## 2018-01-20 MED ORDER — POTASSIUM CHLORIDE 20 MEQ PO PACK
40.0000 meq | PACK | Freq: Once | ORAL | Status: AC
  Administered 2018-01-20: 40 meq
  Filled 2018-01-20: qty 2

## 2018-01-20 MED ORDER — INSULIN ASPART 100 UNIT/ML ~~LOC~~ SOLN
0.0000 [IU] | Freq: Three times a day (TID) | SUBCUTANEOUS | Status: DC
  Administered 2018-01-20 – 2018-01-21 (×4): 2 [IU] via SUBCUTANEOUS
  Filled 2018-01-20 (×4): qty 1

## 2018-01-20 MED ORDER — MAGNESIUM SULFATE 2 GM/50ML IV SOLN
2.0000 g | INTRAVENOUS | Status: AC
  Administered 2018-01-20 (×2): 2 g via INTRAVENOUS
  Filled 2018-01-20 (×2): qty 50

## 2018-01-20 MED ORDER — LORAZEPAM 2 MG/ML IJ SOLN: 0.0000 mg | Freq: Two times a day (BID) | INTRAMUSCULAR | Status: DC

## 2018-01-20 MED ORDER — LORAZEPAM 2 MG/ML IJ SOLN
4.0000 mg | Freq: Once | INTRAMUSCULAR | Status: AC
  Administered 2018-01-20: 4 mg via INTRAVENOUS
  Filled 2018-01-20: qty 2

## 2018-01-20 MED ORDER — PROPOFOL 1000 MG/100ML IV EMUL
0.0000 ug/kg/min | INTRAVENOUS | Status: DC
  Administered 2018-01-20: 15 ug/kg/min via INTRAVENOUS
  Filled 2018-01-20: qty 100

## 2018-01-20 MED ORDER — PHENOBARBITAL SODIUM 65 MG/ML IJ SOLN
260.0000 mg | Freq: Once | INTRAMUSCULAR | Status: AC
  Administered 2018-01-20: 260 mg via INTRAVENOUS
  Filled 2018-01-20: qty 4

## 2018-01-20 MED ORDER — NOREPINEPHRINE 4 MG/250ML-% IV SOLN
INTRAVENOUS | Status: AC
  Filled 2018-01-20: qty 250

## 2018-01-20 MED ORDER — FENTANYL BOLUS VIA INFUSION
200.0000 ug | INTRAVENOUS | Status: AC
  Administered 2018-01-20: 200 ug via INTRAVENOUS
  Filled 2018-01-20: qty 200

## 2018-01-20 MED ORDER — ORAL CARE MOUTH RINSE
15.0000 mL | OROMUCOSAL | Status: DC
  Administered 2018-01-20 – 2018-01-22 (×12): 15 mL via OROMUCOSAL

## 2018-01-20 MED ORDER — SENNOSIDES 8.8 MG/5ML PO SYRP
5.0000 mL | ORAL_SOLUTION | Freq: Two times a day (BID) | ORAL | Status: DC | PRN
  Filled 2018-01-20: qty 5

## 2018-01-20 MED ORDER — LORAZEPAM 1 MG PO TABS: 1.0000 mg | ORAL_TABLET | Freq: Four times a day (QID) | ORAL | Status: DC | PRN

## 2018-01-20 MED ORDER — KETAMINE HCL 10 MG/ML IJ SOLN
100.0000 mg | INTRAMUSCULAR | Status: AC
  Administered 2018-01-20: 100 mg via INTRAVENOUS

## 2018-01-20 MED ORDER — PROPOFOL 1000 MG/100ML IV EMUL
20.0000 ug/kg/min | INTRAVENOUS | Status: DC
  Administered 2018-01-20: 80 ug/kg/min via INTRAVENOUS
  Filled 2018-01-20 (×2): qty 100

## 2018-01-20 MED ORDER — BISACODYL 10 MG RE SUPP: 10.0000 mg | Freq: Every day | RECTAL | Status: DC | PRN

## 2018-01-20 MED ORDER — FENTANYL CITRATE (PF) 100 MCG/2ML IJ SOLN
12.5000 ug | INTRAMUSCULAR | Status: DC | PRN
  Administered 2018-01-21 (×2): 12.5 ug via INTRAVENOUS
  Filled 2018-01-20 (×2): qty 2

## 2018-01-20 MED ORDER — POTASSIUM CHLORIDE 10 MEQ/100ML IV SOLN
10.0000 meq | INTRAVENOUS | Status: AC
  Administered 2018-01-20 (×4): 10 meq via INTRAVENOUS
  Filled 2018-01-20 (×4): qty 100

## 2018-01-20 MED ORDER — ASPIRIN EC 81 MG PO TBEC
81.0000 mg | DELAYED_RELEASE_TABLET | Freq: Every day | ORAL | Status: DC
  Administered 2018-01-20 – 2018-01-22 (×3): 81 mg via ORAL
  Filled 2018-01-20 (×3): qty 1

## 2018-01-20 MED ORDER — LORAZEPAM 2 MG/ML IJ SOLN
INTRAMUSCULAR | Status: AC
  Filled 2018-01-20: qty 4

## 2018-01-20 MED ORDER — CHLORHEXIDINE GLUCONATE 0.12% ORAL RINSE (MEDLINE KIT)
15.0000 mL | Freq: Two times a day (BID) | OROMUCOSAL | Status: DC
  Administered 2018-01-20 (×2): 15 mL via OROMUCOSAL

## 2018-01-20 MED ORDER — FENTANYL BOLUS VIA INFUSION
50.0000 ug | INTRAVENOUS | Status: DC | PRN
  Filled 2018-01-20: qty 50

## 2018-01-20 MED ORDER — ADULT MULTIVITAMIN W/MINERALS CH
1.0000 | ORAL_TABLET | Freq: Once | ORAL | Status: DC
  Filled 2018-01-20: qty 1

## 2018-01-20 MED ORDER — DEXMEDETOMIDINE HCL IN NACL 400 MCG/100ML IV SOLN
0.0000 ug/kg/h | INTRAVENOUS | Status: DC
  Administered 2018-01-20: 0.8 ug/kg/h via INTRAVENOUS
  Administered 2018-01-20: 0.2 ug/kg/h via INTRAVENOUS
  Filled 2018-01-20: qty 100

## 2018-01-20 MED ORDER — FENTANYL CITRATE (PF) 100 MCG/2ML IJ SOLN
50.0000 ug | Freq: Once | INTRAMUSCULAR | Status: DC
  Filled 2018-01-20: qty 2

## 2018-01-20 NOTE — Progress Notes (Addendum)
[facility] - [place] [facility]   PATIENT NAME: Kurt Horton    MR#:  [id number]  DATE OF BIRTH:  1960/12/27  SUBJECTIVE:   Was brought in from home after he was found lying on the floor covered in feces by EMS. He called reporting heavy drinking. Patient was agitated and the emergency room got intubated for airway protection no family in the room REVIEW OF SYSTEMS:   Review of Systems  Unable to perform ROS: Intubated      DRUG ALLERGIES:   Allergies  Allergen Reactions  . Nsaids Other (See Comments)    Duodenal ulcers, GI bleeding  . Other Other (See Comments)    Patient states he can't take any "mycin" medications because myasthenia gravis.    VITALS:  Blood pressure (!) 150/91, pulse (!) 108, temperature (!) 97.1 F (36.2 C), temperature source Axillary, resp. rate 16, height 5\' 11"  (1.803 m), weight 79.4 kg, SpO2 100 %.  PHYSICAL EXAMINATION:   Physical Exam  GENERAL:  57 y.o.-year-old patient lying in the bed with no acute distress. Intubated on the ventilator EYES: Pupils equal, round, reactive to light and accommodation. No scleral icterus. Extraocular muscles intact.  HEENT: Head atraumatic, normocephalic. Oropharynx and nasopharynx clear.  NECK:  Supple, no jugular venous distention. No thyroid enlargement, no tenderness.  LUNGS: Normal breath sounds bilaterally, no wheezing, rales, rhonchi. No use of accessory muscles of respiration.  CARDIOVASCULAR: S1, S2 normal. No murmurs, rubs, or gallops.  ABDOMEN: Soft, nontender, nondistended. Bowel sounds present. No organomegaly or mass.  EXTREMITIES: No cyanosis, clubbing or edema b/l.    NEUROLOGIC: intubated PSYCHIATRIC:  on the vent SKIN: No obvious rash, lesion, or ulcer.   LABORATORY PANEL:  CBC Recent Labs  Lab 01/19/18 1244  WBC 2.8*  HGB 13.8  HCT 42.2  PLT 113*    Chemistries  Recent Labs  Lab 01/19/18 1244 01/20/18 0145  NA 145  --   K 3.0*  --   CL 104  --    CO2 28  --   GLUCOSE 137*  --   BUN 6  --   CREATININE 0.80  --   CALCIUM 8.8*  --   MG  --  1.4*  AST 52*  --   ALT 24  --   ALKPHOS 92  --   BILITOT 0.6  --    Cardiac Enzymes No results for input(s): TROPONINI in the last 168 hours. RADIOLOGY:  Ct Head Wo Contrast  Result Date: 01/19/2018 CLINICAL DATA:  Found down, possible head trauma, ETOH EXAM: CT HEAD WITHOUT CONTRAST CT CERVICAL SPINE WITHOUT CONTRAST TECHNIQUE: Multidetector CT imaging of the head and cervical spine was performed following the standard protocol without intravenous contrast. Multiplanar CT image reconstructions of the cervical spine were also generated. COMPARISON:  CT head dated 08/17/2017 FINDINGS: CT HEAD FINDINGS Brain: No evidence of acute infarction, hemorrhage, hydrocephalus, extra-axial collection or mass lesion/mass effect. Mild cortical atrophy. Vascular: No hyperdense vessel or unexpected calcification. Skull: Normal. Negative for fracture or focal lesion. Sinuses/Orbits: Partial opacification of the right ethmoid sinuses. Mastoid air cells are clear. Other: None. CT CERVICAL SPINE FINDINGS Alignment: Reversal of the normal cervical lordosis. Skull base and vertebrae: No acute fracture. No primary bone lesion or focal pathologic process. Soft tissues and spinal canal: No prevertebral fluid or swelling. No visible canal hematoma. Disc levels: Vertebral body heights are maintained. Mild multilevel degenerative changes. Spinal canal is patent. 4 mm anterolisthesis of C3 on C4.  5 mm anterolisthesis of C4 on C5. Upper chest: Visualized lung apices are clear. Other: Visualized thyroid is unremarkable. IMPRESSION: No evidence of acute intracranial abnormality. Mild cortical atrophy. No evidence of traumatic injury to the cervical spine. Mild multilevel degenerative changes. Anterolisthesis of C3 on C4 and C4 on C5, as above. Electronically Signed   By: Charline Bills M.D.   On: 01/19/2018 13:49   Ct Chest Wo  Contrast  Result Date: 01/19/2018 CLINICAL DATA:  Found down, possible head injury.  ETOH. EXAM: CT CHEST WITHOUT CONTRAST TECHNIQUE: Multidetector CT imaging of the chest was performed following the standard protocol without IV contrast. COMPARISON:  None. FINDINGS: Cardiovascular: Heart size is within normal limits. No pericardial effusion. No thoracic aortic aneurysm. Mediastinum/Nodes: No hemorrhage or edema within the mediastinum. No mass or enlarged lymph nodes seen within the mediastinum or perihilar regions. Lungs/Pleura: Irregular nodular density is seen within the LEFT lower lobe, medial aspect, measuring approximately 1.3 cm greatest dimension (series 3, image 104). Lungs otherwise clear. No pleural effusion or pneumothorax. Upper Abdomen: Liver is diffusely low in density, indicating fatty infiltration. No acute findings. Musculoskeletal: No acute appearing osseous abnormality. Degenerative spondylitic changes scattered throughout the slightly scoliotic thoracic spine, mild to moderate in degree. IMPRESSION: 1. No acute/traumatic findings. 2. Single irregular nodular density within the LEFT lower lobe, measuring 1.3 cm greatest dimension. Suspect nodular atelectasis. Consider one of the following in 3 months for both low-risk and high-risk individuals: (a) repeat chest CT, (b) follow-up PET-CT, or (c) tissue sampling. This recommendation follows the consensus statement: Guidelines for Management of Incidental Pulmonary Nodules Detected on CT Images: From the Fleischner Society 2017; Radiology 2017; 284:228-243. 3. Lungs otherwise clear. No pneumonia or pulmonary edema. No pleural effusion or pneumothorax. 4. Fatty infiltration of the liver. Electronically Signed   By: Bary Richard M.D.   On: 01/19/2018 13:53   Ct Cervical Spine Wo Contrast  Result Date: 01/19/2018 CLINICAL DATA:  Found down, possible head trauma, ETOH EXAM: CT HEAD WITHOUT CONTRAST CT CERVICAL SPINE WITHOUT CONTRAST TECHNIQUE:  Multidetector CT imaging of the head and cervical spine was performed following the standard protocol without intravenous contrast. Multiplanar CT image reconstructions of the cervical spine were also generated. COMPARISON:  CT head dated 08/17/2017 FINDINGS: CT HEAD FINDINGS Brain: No evidence of acute infarction, hemorrhage, hydrocephalus, extra-axial collection or mass lesion/mass effect. Mild cortical atrophy. Vascular: No hyperdense vessel or unexpected calcification. Skull: Normal. Negative for fracture or focal lesion. Sinuses/Orbits: Partial opacification of the right ethmoid sinuses. Mastoid air cells are clear. Other: None. CT CERVICAL SPINE FINDINGS Alignment: Reversal of the normal cervical lordosis. Skull base and vertebrae: No acute fracture. No primary bone lesion or focal pathologic process. Soft tissues and spinal canal: No prevertebral fluid or swelling. No visible canal hematoma. Disc levels: Vertebral body heights are maintained. Mild multilevel degenerative changes. Spinal canal is patent. 4 mm anterolisthesis of C3 on C4.  5 mm anterolisthesis of C4 on C5. Upper chest: Visualized lung apices are clear. Other: Visualized thyroid is unremarkable. IMPRESSION: No evidence of acute intracranial abnormality. Mild cortical atrophy. No evidence of traumatic injury to the cervical spine. Mild multilevel degenerative changes. Anterolisthesis of C3 on C4 and C4 on C5, as above. Electronically Signed   By: Charline Bills M.D.   On: 01/19/2018 13:49   Dg Chest Port 1 View  Result Date: 01/20/2018 CLINICAL DATA:  Intubation. EXAM: PORTABLE CHEST 1 VIEW COMPARISON:  Chest CT yesterday. FINDINGS: Endotracheal tube tip at the  thoracic inlet. Enteric tube in place with tip and side-port below the diaphragm not included in the field of view. Lung volumes are low. Mild bibasilar atelectasis. Heart size upper normal likely accentuated by technique. No confluent airspace disease, pulmonary edema, large  pleural effusion or pneumothorax. IMPRESSION: 1. Endotracheal tube tip at the thoracic inlet. Enteric tube in place with tip and side-port below the diaphragm not included in the field of view. 2. Low lung volumes with bibasilar atelectasis. Electronically Signed   By: Narda Rutherford M.D.   On: 01/20/2018 01:50   Dg Shoulder Left  Result Date: 01/19/2018 CLINICAL DATA:  Larey Seat today and injured left shoulder. EXAM: LEFT SHOULDER - 2+ VIEW COMPARISON:  None. FINDINGS: The joint spaces are maintained. No acute bony findings or bone lesion. No abnormal soft tissue calcifications. The visualized lung is clear and the visualized ribs are intact. IMPRESSION: No fracture or dislocation. Electronically Signed   By: Rudie Meyer M.D.   On: 01/19/2018 15:45   ASSESSMENT AND PLAN:  Mr. Laursen is a 57 year old gentleman with a past medical history remarkable for atrial fibrillation, diabetes, hypertension, myasthenia gravis, alcohol abuse,congestive heart failure with ejection fraction of 40% and diastolic dysfunction with brought in by EMS, reportedly found lying on the floor at home covered in feces  1. acute alcohol intoxication with significant agitation got intubated for airway protection. -Appreciate ICU attending input -continue Ciwa protocol -IV fluids  2. hypomagnesimia/hypokalemia -IV mag and K  3. A fib with RVR -pt is on amiodarone and metoprolol. Currently in sinus rhythm. Will resume once patient is extubated  4. Type II diabetes sliding scale insulin  Case discussed with Care Management/Social Worker. Management plans discussed with the patient, family and they are in agreement.  CODE STATUS: full  DVT Prophylaxis: lovenox  TOTAL TIME TAKING CARE OF THIS PATIENT: *30* minutes.  >50% time spent on counselling and coordination of care  POSSIBLE D/C IN 2-3 DAYS, DEPENDING ON CLINICAL CONDITION.  Note: This dictation was prepared with Dragon dictation along with smaller phrase  technology. Any transcriptional errors that result from this process are unintentional.  Enedina Finner M.D on 01/20/2018 at 1:37 PM  Between 7am to 6pm - Pager - [phone]  After 6pm go to [email or web address] - Social research officer, government  [facility]  Office  [phone]  CC: Primary care physician; [name], MDPatient ID: Kurt Horton, male   DOB: July 10, 1960, 57 y.o.   MRN: [id number]

## 2018-01-20 NOTE — ED Notes (Signed)
Patient hypotensive. MD Rifenbark informed. MD to bedside. Propofol paused.

## 2018-01-20 NOTE — ED Notes (Signed)
This RN and NeurosurgeonMegan RN attempted to get patient up to toilet. Patient became diaphoretic, tremors increased.

## 2018-01-20 NOTE — ED Notes (Signed)
Levophed paused per MD Rifenbark. Patient no longer hypotensive.

## 2018-01-20 NOTE — ED Notes (Signed)
Patient starting to bite the tube. MD Rifenbark at bedside. MD ordered to increased propofol to 80 mcg/kg/min, bolus with 50 mcg, and restart levophed at 5 mcg/min.

## 2018-01-20 NOTE — ED Notes (Signed)
MD Rifenbark gave this RN verbal order to administer 50 mg bolus of propofol if needed prior to/during transport.

## 2018-01-20 NOTE — ED Provider Notes (Signed)
Despite CIWA protocol the patient is now tachycardic, tremulous, obtunded, very clearly in moderate alcohol withdrawal pending delirium tremens.  I will give him a multivitamin, thiamine, as well as 260 mg of IV phenobarbital and 4 mg of IV lorazepam now for withdrawal and he will require inpatient admission.   Merrily Brittleifenbark, Lundon Rosier, MD 01/20/18 [id number]

## 2018-01-20 NOTE — Consult Note (Signed)
Reason for Consult:Critical Care Management Referring Physician: Hospitalist Service  Kurt Horton is an 57 y.o. male.   HPI: Kurt Horton is a 58 year old gentleman with a past medical history remarkable for atrial fibrillation, diabetes, hypertension, myasthenia gravis, alcohol abuse,congestive heart failure with ejection fraction of 64% and diastolic dysfunction with brought in by EMS, reportedly found lying on the floor at home covered in feces. He claimed to have fallen, hit his head and left shoulder and lost consciousness at some point, reportedly drinking heavily daily. In emergency department patient became agitated and tachycardic diaphoretic tremulous and altered and was subsequently intubated.His blood pressure had dropped, was given fluids started on norepinephrine and central line was placed. Presently is intubated in the intensive care unit  Past Medical History:  Diagnosis Date  . A-fib ([place])   . Alcohol abuse   . Alcohol withdrawal ([place]) 11/12/2016  . Diabetes mellitus without complication ([place])   . DJD (degenerative joint disease)   . Hypertension   . Myasthenia gravis ([place])   . Renal disorder     Past Surgical History:  Procedure Laterality Date  . AMPUTATION TOE Left 12/29/2015   Procedure: AMPUTATION TOE;  Surgeon: Samara Deist, DPM;  Location: [facility] ORS;  Service: Podiatry;  Laterality: Left;  . PERIPHERAL VASCULAR CATHETERIZATION N/A 12/27/2015   Procedure: Lower Extremity Angiography;  Surgeon: Algernon Huxley, MD;  Location: [place] [facility];  Service: Cardiovascular;  Laterality: N/A;  . right hip surgery    . right shoulder surgery      Family History  Problem Relation Age of Onset  . Rheum arthritis Father     Social History:  reports that he has been smoking cigarettes. He has been smoking about 0.50 packs per day. He has never used smokeless tobacco. He reports that he drinks alcohol. He reports that he does not use drugs.  Allergies:  Allergies   Allergen Reactions  . Nsaids Other (See Comments)    Duodenal ulcers, GI bleeding  . Other Other (See Comments)    Patient states he can't take any "mycin" medications because myasthenia gravis.    Medications: I have reviewed the patient's current medications.  Results for orders placed or performed during the hospital encounter of 01/19/18 (from the past 48 hour(s))  CBC with Differential     Status: Abnormal   Collection Time: 01/19/18 12:44 PM  Result Value Ref Range   WBC 2.8 (L) 3.8 - 10.6 K/uL   RBC 5.13 4.40 - 5.90 MIL/uL   Hemoglobin 13.8 13.0 - 18.0 g/dL   HCT 42.2 40.0 - 52.0 %   MCV 82.3 80.0 - 100.0 fL   MCH 26.9 26.0 - 34.0 pg   MCHC 32.6 32.0 - 36.0 g/dL   RDW 22.3 (H) 11.5 - 14.5 %   Platelets 113 (L) 150 - 440 K/uL   Neutrophils Relative % 33 %   Neutro Abs 0.9 (L) 1.4 - 6.5 K/uL   Lymphocytes Relative 47 %   Lymphs Abs 1.3 1.0 - 3.6 K/uL   Monocytes Relative 15 %   Monocytes Absolute 0.4 0.2 - 1.0 K/uL   Eosinophils Relative 1 %   Eosinophils Absolute 0.0 0 - 0.7 K/uL   Basophils Relative 4 %   Basophils Absolute 0.1 0 - 0.1 K/uL    Comment: Performed at [facility], [address], [place], [place] [address]  Comprehensive metabolic panel     Status: Abnormal   Collection Time: 01/19/18 12:44 PM  Result  Value Ref Range   Sodium 145 135 - 145 mmol/L   Potassium 3.0 (L) 3.5 - 5.1 mmol/L   Chloride 104 98 - 111 mmol/L   CO2 28 22 - 32 mmol/L   Glucose, Bld 137 (H) 70 - 99 mg/dL   BUN 6 6 - 20 mg/dL   Creatinine, Ser 0.80 0.61 - 1.24 mg/dL   Calcium 8.8 (L) 8.9 - 10.3 mg/dL   Total Protein 7.8 6.5 - 8.1 g/dL   Albumin 4.4 3.5 - 5.0 g/dL   AST 52 (H) 15 - 41 U/L   ALT 24 0 - 44 U/L   Alkaline Phosphatase 92 38 - 126 U/L   Total Bilirubin 0.6 0.3 - 1.2 mg/dL   GFR calc non Af Amer >60 >60 mL/min   GFR calc Af Amer >60 >60 mL/min    Comment: (NOTE) The eGFR has been calculated using the CKD EPI equation. This calculation has not been  validated in all clinical situations. eGFR's persistently <60 mL/min signify possible Chronic Kidney Disease.    Anion gap 13 5 - 15    Comment: Performed at [facility], [address] [place], [place] [address]  Ethanol     Status: Abnormal   Collection Time: 01/19/18 12:44 PM  Result Value Ref Range   Alcohol, Ethyl (B) 317 (HH) <10 mg/dL    Comment: CRITICAL RESULT CALLED TO, READ BACK BY AND VERIFIED WITH JESSICA REEVES 01/19/18 @ 1310  MLK (NOTE) Lowest detectable limit for serum alcohol is 10 mg/dL. For medical purposes only. Performed at [facility], [address] [place], [place] [address]   CK     Status: None   Collection Time: 01/19/18 12:44 PM  Result Value Ref Range   Total CK 133 49 - 397 U/L    Comment: Performed at [facility], [address] [place], [place] [address]  Acetaminophen level     Status: Abnormal   Collection Time: 01/19/18 12:44 PM  Result Value Ref Range   Acetaminophen (Tylenol), Serum <10 (L) 10 - 30 ug/mL    Comment: (NOTE) Therapeutic concentrations vary significantly. A range of 10-30 ug/mL  may be an effective concentration for many patients. However, some  are best treated at concentrations outside of this range. Acetaminophen concentrations >150 ug/mL at 4 hours after ingestion  and >50 ug/mL at 12 hours after ingestion are often associated with  toxic reactions. Performed at [facility], [address] [place], [place] [address]   Salicylate level     Status: None   Collection Time: 01/19/18 12:44 PM  Result Value Ref Range   Salicylate Lvl <1.7 2.8 - 30.0 mg/dL    Comment: Performed at [facility], [address] [place], [place] [address]  Urinalysis, Complete w Microscopic     Status: Abnormal   Collection Time: 01/19/18  2:41 PM  Result Value Ref Range   Color, Urine YELLOW (A) YELLOW   APPearance CLEAR (A) CLEAR   Specific Gravity, Urine 1.008 1.005 - 1.030   pH  6.0 5.0 - 8.0   Glucose, UA NEGATIVE NEGATIVE mg/dL   Hgb urine dipstick NEGATIVE NEGATIVE   Bilirubin Urine NEGATIVE NEGATIVE   Ketones, ur NEGATIVE NEGATIVE mg/dL   Protein, ur NEGATIVE NEGATIVE mg/dL   Nitrite NEGATIVE NEGATIVE   Leukocytes, UA NEGATIVE NEGATIVE   RBC / HPF 0-5 0 - 5 RBC/hpf   WBC, UA 0-5 0 - 5 WBC/hpf   Bacteria, UA NONE SEEN  NONE SEEN   Squamous Epithelial / LPF 0-5 0 - 5    Comment: Performed at [facility], [address] [place], [place] [address]  Urine Drug Screen, Qualitative [facility] only)     Status: Abnormal   Collection Time: 01/19/18  2:41 PM  Result Value Ref Range   Tricyclic, Ur Screen NONE DETECTED NONE DETECTED   Amphetamines, Ur Screen NONE DETECTED NONE DETECTED   MDMA (Ecstasy)Ur Screen NONE DETECTED NONE DETECTED   Cocaine Metabolite,Ur [place] NONE DETECTED NONE DETECTED   Opiate, Ur Screen NONE DETECTED NONE DETECTED   Phencyclidine (PCP) Ur S NONE DETECTED NONE DETECTED   Cannabinoid 50 Ng, Ur  NONE DETECTED NONE DETECTED   Barbiturates, Ur Screen NONE DETECTED NONE DETECTED   Benzodiazepine, Ur Scrn POSITIVE (A) NONE DETECTED   Methadone Scn, Ur NONE DETECTED NONE DETECTED    Comment: (NOTE) Tricyclics + metabolites, urine    Cutoff 1000 ng/mL Amphetamines + metabolites, urine  Cutoff 1000 ng/mL MDMA (Ecstasy), urine              Cutoff 500 ng/mL Cocaine Metabolite, urine          Cutoff 300 ng/mL Opiate + metabolites, urine        Cutoff 300 ng/mL Phencyclidine (PCP), urine         Cutoff 25 ng/mL Cannabinoid, urine                 Cutoff 50 ng/mL Barbiturates + metabolites, urine  Cutoff 200 ng/mL Benzodiazepine, urine              Cutoff 200 ng/mL Methadone, urine                   Cutoff 300 ng/mL The urine drug screen provides only a preliminary, unconfirmed analytical test result and should not be used for non-medical purposes. Clinical consideration and professional judgment should be applied to any positive drug  screen result due to possible interfering substances. A more specific alternate chemical method must be used in order to obtain a confirmed analytical result. Gas chromatography / mass spectrometry (GC/MS) is the preferred confirmat ory method. Performed at [facility], [address] [place], [place] [address]   Blood gas, arterial     Status: None   Collection Time: 01/20/18  1:42 AM  Result Value Ref Range   FIO2 0.35    Delivery systems VENTILATOR    Mode PRESSURE REGULATED VOLUME CONTROL    Peep/cpap 5.0 cm H20   pH, Arterial 7.45 7.350 - 7.450   pCO2 arterial 35 32.0 - 48.0 mmHg   pO2, Arterial 87 83.0 - 108.0 mmHg   Bicarbonate 24.3 20.0 - 28.0 mmol/L   Acid-Base Excess 0.7 0.0 - 2.0 mmol/L   O2 Saturation 97.1 %   Patient temperature 37.0    Collection site RIGHT RADIAL    Sample type ARTHROGRAPHIS SPECIES    Allens test (pass/fail) PASS PASS   Mechanical Rate 12     Comment: Performed at [facility], [address] [place], [place] [address]  Lactic acid, plasma     Status: Abnormal   Collection Time: 01/20/18  1:45 AM  Result Value Ref Range   Lactic Acid, Venous 2.6 (HH) 0.5 - 1.9 mmol/L    Comment: CRITICAL RESULT CALLED TO, READ BACK BY AND VERIFIED WITH JENNA ELLINGTON AT 0223 01/20/18.PMH Performed at [facility], [address] [place], [place] [address]   Lipase, blood  Status: Abnormal   Collection Time: 01/20/18  1:45 AM  Result Value Ref Range   Lipase 97 (H) 11 - 51 U/L    Comment: Performed at [facility], [address] [place], [place] [address]  Magnesium     Status: Abnormal   Collection Time: 01/20/18  1:45 AM  Result Value Ref Range   Magnesium 1.4 (L) 1.7 - 2.4 mg/dL    Comment: Performed at [facility], [address] [place], [place] [address]  Phosphorus     Status: None   Collection Time: 01/20/18  1:45 AM  Result Value Ref Range   Phosphorus 2.9 2.5 - 4.6 mg/dL    Comment:  Performed at [facility], [address] [place], [place] [address]  Glucose, capillary     Status: Abnormal   Collection Time: 01/20/18  3:38 AM  Result Value Ref Range   Glucose-Capillary 165 (H) 70 - 99 mg/dL   Comment 1 Notify RN   MRSA PCR Screening     Status: None   Collection Time: 01/20/18  3:39 AM  Result Value Ref Range   MRSA by PCR NEGATIVE NEGATIVE    Comment:        The GeneXpert MRSA Assay (FDA approved for NASAL specimens only), is one component of a comprehensive MRSA colonization surveillance program. It is not intended to diagnose MRSA infection nor to guide or monitor treatment for MRSA infections. Performed at [facility], [place],  [address]     Ct Head Wo Contrast  Result Date: 01/19/2018 CLINICAL DATA:  Found down, possible head trauma, ETOH EXAM: CT HEAD WITHOUT CONTRAST CT CERVICAL SPINE WITHOUT CONTRAST TECHNIQUE: Multidetector CT imaging of the head and cervical spine was performed following the standard protocol without intravenous contrast. Multiplanar CT image reconstructions of the cervical spine were also generated. COMPARISON:  CT head dated 08/17/2017 FINDINGS: CT HEAD FINDINGS Brain: No evidence of acute infarction, hemorrhage, hydrocephalus, extra-axial collection or mass lesion/mass effect. Mild cortical atrophy. Vascular: No hyperdense vessel or unexpected calcification. Skull: Normal. Negative for fracture or focal lesion. Sinuses/Orbits: Partial opacification of the right ethmoid sinuses. Mastoid air cells are clear. Other: None. CT CERVICAL SPINE FINDINGS Alignment: Reversal of the normal cervical lordosis. Skull base and vertebrae: No acute fracture. No primary bone lesion or focal pathologic process. Soft tissues and spinal canal: No prevertebral fluid or swelling. No visible canal hematoma. Disc levels: Vertebral body heights are maintained. Mild multilevel degenerative changes. Spinal canal is  patent. 4 mm anterolisthesis of C3 on C4.  5 mm anterolisthesis of C4 on C5. Upper chest: Visualized lung apices are clear. Other: Visualized thyroid is unremarkable. IMPRESSION: No evidence of acute intracranial abnormality. Mild cortical atrophy. No evidence of traumatic injury to the cervical spine. Mild multilevel degenerative changes. Anterolisthesis of C3 on C4 and C4 on C5, as above. Electronically Signed   By: Julian Hy M.D.   On: 01/19/2018 13:49   Ct Chest Wo Contrast  Result Date: 01/19/2018 CLINICAL DATA:  Found down, possible head injury.  ETOH. EXAM: CT CHEST WITHOUT CONTRAST TECHNIQUE: Multidetector CT imaging of the chest was performed following the standard protocol without IV contrast. COMPARISON:  None. FINDINGS: Cardiovascular: Heart size is within normal limits. No pericardial effusion. No thoracic aortic aneurysm. Mediastinum/Nodes: No hemorrhage or edema within the mediastinum. No mass or enlarged lymph nodes seen within the mediastinum or perihilar regions. Lungs/Pleura: Irregular nodular density is seen within the LEFT lower lobe,  medial aspect, measuring approximately 1.3 cm greatest dimension (series 3, image 104). Lungs otherwise clear. No pleural effusion or pneumothorax. Upper Abdomen: Liver is diffusely low in density, indicating fatty infiltration. No acute findings. Musculoskeletal: No acute appearing osseous abnormality. Degenerative spondylitic changes scattered throughout the slightly scoliotic thoracic spine, mild to moderate in degree. IMPRESSION: 1. No acute/traumatic findings. 2. Single irregular nodular density within the LEFT lower lobe, measuring 1.3 cm greatest dimension. Suspect nodular atelectasis. Consider one of the following in 3 months for both low-risk and high-risk individuals: (a) repeat chest CT, (b) follow-up PET-CT, or (c) tissue sampling. This recommendation follows the consensus statement: Guidelines for Management of Incidental Pulmonary Nodules  Detected on CT Images: From the Fleischner Society 2017; Radiology 2017; 284:228-243. 3. Lungs otherwise clear. No pneumonia or pulmonary edema. No pleural effusion or pneumothorax. 4. Fatty infiltration of the liver. Electronically Signed   By: Franki Cabot M.D.   On: 01/19/2018 13:53   Ct Cervical Spine Wo Contrast  Result Date: 01/19/2018 CLINICAL DATA:  Found down, possible head trauma, ETOH EXAM: CT HEAD WITHOUT CONTRAST CT CERVICAL SPINE WITHOUT CONTRAST TECHNIQUE: Multidetector CT imaging of the head and cervical spine was performed following the standard protocol without intravenous contrast. Multiplanar CT image reconstructions of the cervical spine were also generated. COMPARISON:  CT head dated 08/17/2017 FINDINGS: CT HEAD FINDINGS Brain: No evidence of acute infarction, hemorrhage, hydrocephalus, extra-axial collection or mass lesion/mass effect. Mild cortical atrophy. Vascular: No hyperdense vessel or unexpected calcification. Skull: Normal. Negative for fracture or focal lesion. Sinuses/Orbits: Partial opacification of the right ethmoid sinuses. Mastoid air cells are clear. Other: None. CT CERVICAL SPINE FINDINGS Alignment: Reversal of the normal cervical lordosis. Skull base and vertebrae: No acute fracture. No primary bone lesion or focal pathologic process. Soft tissues and spinal canal: No prevertebral fluid or swelling. No visible canal hematoma. Disc levels: Vertebral body heights are maintained. Mild multilevel degenerative changes. Spinal canal is patent. 4 mm anterolisthesis of C3 on C4.  5 mm anterolisthesis of C4 on C5. Upper chest: Visualized lung apices are clear. Other: Visualized thyroid is unremarkable. IMPRESSION: No evidence of acute intracranial abnormality. Mild cortical atrophy. No evidence of traumatic injury to the cervical spine. Mild multilevel degenerative changes. Anterolisthesis of C3 on C4 and C4 on C5, as above. Electronically Signed   By: Julian Hy M.D.   On:  01/19/2018 13:49   Dg Chest Port 1 View  Result Date: 01/20/2018 CLINICAL DATA:  Intubation. EXAM: PORTABLE CHEST 1 VIEW COMPARISON:  Chest CT yesterday. FINDINGS: Endotracheal tube tip at the thoracic inlet. Enteric tube in place with tip and side-port below the diaphragm not included in the field of view. Lung volumes are low. Mild bibasilar atelectasis. Heart size upper normal likely accentuated by technique. No confluent airspace disease, pulmonary edema, large pleural effusion or pneumothorax. IMPRESSION: 1. Endotracheal tube tip at the thoracic inlet. Enteric tube in place with tip and side-port below the diaphragm not included in the field of view. 2. Low lung volumes with bibasilar atelectasis. Electronically Signed   By: Keith Rake M.D.   On: 01/20/2018 01:50   Dg Shoulder Left  Result Date: 01/19/2018 CLINICAL DATA:  [place] today and injured left shoulder. EXAM: LEFT SHOULDER - 2+ VIEW COMPARISON:  None. FINDINGS: The joint spaces are maintained. No acute bony findings or bone lesion. No abnormal soft tissue calcifications. The visualized lung is clear and the visualized ribs are intact. IMPRESSION: No fracture or dislocation. Electronically Signed  By: Marijo Sanes M.D.   On: 01/19/2018 15:45    ROS  Unable to obtain review of systems as patient is presently intubated and sedated on mechanical ventilation  Blood pressure (!) 95/59, pulse 82, temperature 98.2 F (36.8 C), temperature source Axillary, resp. rate 12, height '5\' 11"'$  (1.803 m), weight 79.4 kg, SpO2 100 %. Physical Exam   Patient is orally intubated on mechanical ventilation. Sedated HEENT: Trachea is midline, no thyromegaly appreciated, no oral lesions noted Cardiovascular: Regular rate and rhythm Pulmonary: Coarse expiratory rhonchi appreciated Abdominal: Positive bowel sounds, soft exam Extremity: No clubbing cyanosis or edema noted Neurologic: Patient is sedated and intubated with limited  exam  Assessment/Plan:  57 year old gentleman with chronic EtOH abuse and acute intoxication syndrome with an alcohol level of 317,with altered mental status, fall,and subsequent intubation and mechanical ventilation. Patient had central line placed, was started on pressors. We'll place on CIWA, supplement with thiamine folic acid and multivitamins, as needed benzodiazepines. Will correct electrolytes to include hypomagnesemia, hypokalemia  Leukopenia, most likely marrow suppression  Atrial fibrillation. On amiodarone and metoprolol, control ventricular response presently in sinus rhythm  Paetyn Pietrzak,[place] 01/20/2018, 7:16 AM

## 2018-01-20 NOTE — Clinical Social Work Note (Signed)
CSW received consult for NH placement. PT is pending. The patient does not have a payor source. CSW will assess for ETOH resources once appropriate as the patient is currently intubated.  Kurt PonderKaren Martha Sylvie Horton, MSW, Theresia MajorsLCSWA [phone]

## 2018-01-20 NOTE — H&P (Signed)
[facility] [place] [facility]   PATIENT NAME: Kurt Horton    MR#:  [id number]  DATE OF BIRTH:  1961/02/27  DATE OF ADMISSION:  01/19/2018  PRIMARY CARE PHYSICIAN: [name], MD   REQUESTING/REFERRING PHYSICIAN: Merrily Brittle, MD  CHIEF COMPLAINT:   Chief Complaint  Patient presents with  . Fall    HISTORY OF PRESENT ILLNESS:  Kurt Horton  is a 57 y.o. male with a known history of EtOH dependence/abuse/intoxication/ withdrawal, PAF (on Amio; no AC), chronic systolic + diastolic CHF (EF 40% w/ grade 1 diastolic dysfxn as of 02/26/2017 Echo), myasthenia (CellCept), T2NIDDM, HTN p/w EtOH intoxication, fall, w/ subsequent AMS/agitation necessitating intubation. He is already intubated at the time of my assessment. He is well known to the Emergency Department providers and staff. I admitted him to the hospital on 08/17 (2/2 EtOH intoxication). He was discharged several days later, and went home. He reportedly lives with his parents, who are themselves of advanced age. He was brought in by EMS in the early afternoon on Saturday 01/19/2018. He was reportedly found lying on the floor at home covered in feces. He claimed he had fallen, hit his L head and L shoulder, and lost consciousness at some point during the previous evening. He had also reported drinking heavily daily, as much as he possibly can (unquantifiable). He has not taken his medications. He was to be admitted for monitoring, EtOH withdrawal protocol, but whilst still in ED, became agitated, tachycardic, diaphoretic, tremulous, altered and incontinent of stool. He was sedated with large doses of sedative medication, and was intubated. He has received bolus IVF, and was started on Levophed for hypotension. He has a R groin line.  PAST MEDICAL HISTORY:   Past Medical History:  Diagnosis Date  . A-fib (HCC)   . Alcohol abuse   . Alcohol withdrawal (HCC) 11/12/2016  . Diabetes mellitus without  complication (HCC)   . DJD (degenerative joint disease)   . Hypertension   . Myasthenia gravis (HCC)   . Renal disorder     PAST SURGICAL HISTORY:   Past Surgical History:  Procedure Laterality Date  . AMPUTATION TOE Left 12/29/2015   Procedure: AMPUTATION TOE;  Surgeon: Gwyneth Revels, DPM;  Location: [facility] ORS;  Service: Podiatry;  Laterality: Left;  . PERIPHERAL VASCULAR CATHETERIZATION N/A 12/27/2015   Procedure: Lower Extremity Angiography;  Surgeon: Annice Needy, MD;  Location: [facility] INVASIVE CV LAB;  Service: Cardiovascular;  Laterality: N/A;  . right hip surgery    . right shoulder surgery      SOCIAL HISTORY:   Social History   Tobacco Use  . Smoking status: Current Every Day Smoker    Packs/day: 0.50    Types: Cigarettes  . Smokeless tobacco: Never Used  Substance Use Topics  . Alcohol use: Yes    Comment: Pt had tequila and rum, at least a 5th or a pint a day    FAMILY HISTORY:   Family History  Problem Relation Age of Onset  . Rheum arthritis Father     DRUG ALLERGIES:   Allergies  Allergen Reactions  . Nsaids Other (See Comments)    Duodenal ulcers, GI bleeding  . Other Other (See Comments)    Patient states he can't take any "mycin" medications because myasthenia gravis.    REVIEW OF SYSTEMS:   Review of Systems  Unable to perform ROS: Intubated  Musculoskeletal: Positive for falls.  Neurological: Positive for loss of consciousness.  Intubated. Per HPI. MEDICATIONS AT HOME:   Prior to Admission medications   Medication Sig Start Date End Date Taking? Authorizing Provider  mycophenolate (CELLCEPT) 500 MG tablet Take 1 tablet (500 mg total) by mouth 2 (two) times daily. Take 1 tablet (500MG ) by mouth every morning and 2 tablets (1000MG ) by mouth every evening 11/02/17  Yes Wieting, Richard, MD  acetaminophen (TYLENOL) 325 MG tablet Take 2 tablets (650 mg total) by mouth every 6 (six) hours as needed for mild pain (or Fever >/= 101). 11/02/17    Alford Highland, MD  amiodarone (PACERONE) 200 MG tablet Take 1 tablet (200 mg total) by mouth daily. 11/02/17   Alford Highland, MD  aspirin 325 MG EC tablet Take 1 tablet (325 mg total) by mouth daily. 11/02/17   Alford Highland, MD  B Complex-C (B-COMPLEX WITH VITAMIN C) tablet Take 1 tablet by mouth daily. 11/02/17   Alford Highland, MD  bisacodyl (DULCOLAX) 5 MG EC tablet Take 1 tablet (5 mg total) by mouth daily as needed for moderate constipation. 12/25/17   Mayo, Allyn Kenner, MD  citalopram (CELEXA) 20 MG tablet Take 1 tablet (20 mg total) by mouth daily. 11/02/17   Alford Highland, MD  diltiazem (CARDIZEM CD) 180 MG 24 hr capsule Take 1 capsule (180 mg total) by mouth daily. 11/02/17 03/02/18  Alford Highland, MD  fentaNYL (DURAGESIC - DOSED MCG/HR) 75 MCG/HR Place 1 patch (75 mcg total) onto the skin every 3 (three) days. 12/25/17   Mayo, Allyn Kenner, MD  folic acid (FOLVITE) 1 MG tablet Take 1 tablet (1 mg total) by mouth daily. 12/26/17   Mayo, Allyn Kenner, MD  gabapentin (NEURONTIN) 300 MG capsule Take 2 capsules (600 mg total) by mouth 3 (three) times daily. 11/02/17   Alford Highland, MD  lidocaine (LIDODERM) 5 % Place 1 patch onto the skin daily. Remove & Discard patch within 12 hours or as directed by MD 11/02/17   Alford Highland, MD  LORazepam (ATIVAN) 1 MG tablet Take 1 tablet by mouth twice daily for 1 week, then take 1/2 tablet (0.5mg ) by mouth twice daily for 1 week, then take 1/2 tablet (0.5mg ) by mouth once daily for 1 week 12/25/17   Mayo, Allyn Kenner, MD  metFORMIN (GLUCOPHAGE) 500 MG tablet Take 1 tablet (500 mg total) by mouth daily. 11/02/17   Alford Highland, MD  metoprolol succinate (TOPROL-XL) 50 MG 24 hr tablet Take 1 tablet (50 mg total) by mouth daily. Take with or immediately following a meal. 11/02/17   Alford Highland, MD  multivitamin (ONE-A-DAY MEN'S) TABS tablet Take 1 tablet by mouth daily. 11/02/17   Alford Highland, MD  nicotine (NICODERM CQ - DOSED IN MG/24 HOURS) 14  mg/24hr patch Place 1 patch (14 mg total) onto the skin daily. Okay to substitute generic 11/02/17   Alford Highland, MD  oxyCODONE (ROXICODONE) 15 MG immediate release tablet Take 1 tablet (15 mg total) by mouth every 8 (eight) hours as needed for pain. 12/25/17   Mayo, Allyn Kenner, MD  senna-docusate (SENOKOT-S) 8.6-50 MG tablet Take 1 tablet by mouth at bedtime as needed for mild constipation. 12/25/17   Mayo, Allyn Kenner, MD  testosterone cypionate (DEPOTESTOTERONE CYPIONATE) 100 MG/ML injection Inject into the muscle every 14 (fourteen) days. For IM use only    [provider]  thiamine 100 MG tablet Take 1 tablet (100 mg total) by mouth daily. 11/02/17   Alford Highland, MD      VITAL SIGNS:  Blood pressure Marland Kitchen)  146/93, pulse (!) 101, temperature 98.4 F (36.9 C), temperature source Oral, resp. rate (!) 25, height 5\' 11"  (1.803 m), weight 79.4 kg, SpO2 100 %.  PHYSICAL EXAMINATION:  Physical Exam  Constitutional: He appears well-developed and well-nourished.  Non-toxic appearance. He is sedated and intubated.  Eyes: Conjunctivae and lids are normal. No scleral icterus.  Neck: Neck supple. No JVD present. No thyromegaly present.  Cardiovascular: Regular rhythm, S1 normal and S2 normal.  No extrasystoles are present. Tachycardia present. Exam reveals no gallop, no S3, no S4, no distant heart sounds and no friction rub.  No murmur heard. Pulmonary/Chest: No accessory muscle usage or stridor. Tachypnea noted. No apnea and no bradypnea. He is intubated. He has decreased breath sounds in the right upper field, the right middle field, the right lower field, the left upper field, the left middle field and the left lower field. He has no wheezes. He has no rhonchi. He has no rales.  Abdominal: Soft. He exhibits no distension. There is no tenderness. There is no rigidity, no rebound and no guarding.  Musculoskeletal: He exhibits no edema.  Lymphadenopathy:    He has no cervical adenopathy.    Neurological: He is unresponsive.  Intubated/sedated.  Skin: Skin is warm, dry and intact. No rash noted. He is not diaphoretic. No erythema.  Psychiatric: He is noncommunicative.  Intubated/sedated.   LABORATORY PANEL:   CBC Recent Labs  Lab 01/19/18 1244  WBC 2.8*  HGB 13.8  HCT 42.2  PLT 113*   ------------------------------------------------------------------------------------------------------------------  Chemistries  Recent Labs  Lab 01/19/18 1244 01/20/18 0145  NA 145  --   K 3.0*  --   CL 104  --   CO2 28  --   GLUCOSE 137*  --   BUN 6  --   CREATININE 0.80  --   CALCIUM 8.8*  --   MG  --  1.4*  AST 52*  --   ALT 24  --   ALKPHOS 92  --   BILITOT 0.6  --    ------------------------------------------------------------------------------------------------------------------  Cardiac Enzymes No results for input(s): TROPONINI in the last 168 hours. ------------------------------------------------------------------------------------------------------------------  RADIOLOGY:  Ct Head Wo Contrast  Result Date: 01/19/2018 CLINICAL DATA:  Found down, possible head trauma, ETOH EXAM: CT HEAD WITHOUT CONTRAST CT CERVICAL SPINE WITHOUT CONTRAST TECHNIQUE: Multidetector CT imaging of the head and cervical spine was performed following the standard protocol without intravenous contrast. Multiplanar CT image reconstructions of the cervical spine were also generated. COMPARISON:  CT head dated 08/17/2017 FINDINGS: CT HEAD FINDINGS Brain: No evidence of acute infarction, hemorrhage, hydrocephalus, extra-axial collection or mass lesion/mass effect. Mild cortical atrophy. Vascular: No hyperdense vessel or unexpected calcification. Skull: Normal. Negative for fracture or focal lesion. Sinuses/Orbits: Partial opacification of the right ethmoid sinuses. Mastoid air cells are clear. Other: None. CT CERVICAL SPINE FINDINGS Alignment: Reversal of the normal cervical lordosis. Skull  base and vertebrae: No acute fracture. No primary bone lesion or focal pathologic process. Soft tissues and spinal canal: No prevertebral fluid or swelling. No visible canal hematoma. Disc levels: Vertebral body heights are maintained. Mild multilevel degenerative changes. Spinal canal is patent. 4 mm anterolisthesis of C3 on C4.  5 mm anterolisthesis of C4 on C5. Upper chest: Visualized lung apices are clear. Other: Visualized thyroid is unremarkable. IMPRESSION: No evidence of acute intracranial abnormality. Mild cortical atrophy. No evidence of traumatic injury to the cervical spine. Mild multilevel degenerative changes. Anterolisthesis of C3 on C4 and C4 on C5, as  above. Electronically Signed   By: Charline BillsSriyesh  Krishnan M.D.   On: 01/19/2018 13:49   Ct Chest Wo Contrast  Result Date: 01/19/2018 CLINICAL DATA:  Found down, possible head injury.  ETOH. EXAM: CT CHEST WITHOUT CONTRAST TECHNIQUE: Multidetector CT imaging of the chest was performed following the standard protocol without IV contrast. COMPARISON:  None. FINDINGS: Cardiovascular: Heart size is within normal limits. No pericardial effusion. No thoracic aortic aneurysm. Mediastinum/Nodes: No hemorrhage or edema within the mediastinum. No mass or enlarged lymph nodes seen within the mediastinum or perihilar regions. Lungs/Pleura: Irregular nodular density is seen within the LEFT lower lobe, medial aspect, measuring approximately 1.3 cm greatest dimension (series 3, image 104). Lungs otherwise clear. No pleural effusion or pneumothorax. Upper Abdomen: Liver is diffusely low in density, indicating fatty infiltration. No acute findings. Musculoskeletal: No acute appearing osseous abnormality. Degenerative spondylitic changes scattered throughout the slightly scoliotic thoracic spine, mild to moderate in degree. IMPRESSION: 1. No acute/traumatic findings. 2. Single irregular nodular density within the LEFT lower lobe, measuring 1.3 cm greatest dimension.  Suspect nodular atelectasis. Consider one of the following in 3 months for both low-risk and high-risk individuals: (a) repeat chest CT, (b) follow-up PET-CT, or (c) tissue sampling. This recommendation follows the consensus statement: Guidelines for Management of Incidental Pulmonary Nodules Detected on CT Images: From the Fleischner Society 2017; Radiology 2017; 284:228-243. 3. Lungs otherwise clear. No pneumonia or pulmonary edema. No pleural effusion or pneumothorax. 4. Fatty infiltration of the liver. Electronically Signed   By: Bary RichardStan  Maynard M.D.   On: 01/19/2018 13:53   Ct Cervical Spine Wo Contrast  Result Date: 01/19/2018 CLINICAL DATA:  Found down, possible head trauma, ETOH EXAM: CT HEAD WITHOUT CONTRAST CT CERVICAL SPINE WITHOUT CONTRAST TECHNIQUE: Multidetector CT imaging of the head and cervical spine was performed following the standard protocol without intravenous contrast. Multiplanar CT image reconstructions of the cervical spine were also generated. COMPARISON:  CT head dated 08/17/2017 FINDINGS: CT HEAD FINDINGS Brain: No evidence of acute infarction, hemorrhage, hydrocephalus, extra-axial collection or mass lesion/mass effect. Mild cortical atrophy. Vascular: No hyperdense vessel or unexpected calcification. Skull: Normal. Negative for fracture or focal lesion. Sinuses/Orbits: Partial opacification of the right ethmoid sinuses. Mastoid air cells are clear. Other: None. CT CERVICAL SPINE FINDINGS Alignment: Reversal of the normal cervical lordosis. Skull base and vertebrae: No acute fracture. No primary bone lesion or focal pathologic process. Soft tissues and spinal canal: No prevertebral fluid or swelling. No visible canal hematoma. Disc levels: Vertebral body heights are maintained. Mild multilevel degenerative changes. Spinal canal is patent. 4 mm anterolisthesis of C3 on C4.  5 mm anterolisthesis of C4 on C5. Upper chest: Visualized lung apices are clear. Other: Visualized thyroid is  unremarkable. IMPRESSION: No evidence of acute intracranial abnormality. Mild cortical atrophy. No evidence of traumatic injury to the cervical spine. Mild multilevel degenerative changes. Anterolisthesis of C3 on C4 and C4 on C5, as above. Electronically Signed   By: Charline BillsSriyesh  Krishnan M.D.   On: 01/19/2018 13:49   Dg Chest Port 1 View  Result Date: 01/20/2018 CLINICAL DATA:  Intubation. EXAM: PORTABLE CHEST 1 VIEW COMPARISON:  Chest CT yesterday. FINDINGS: Endotracheal tube tip at the thoracic inlet. Enteric tube in place with tip and side-port below the diaphragm not included in the field of view. Lung volumes are low. Mild bibasilar atelectasis. Heart size upper normal likely accentuated by technique. No confluent airspace disease, pulmonary edema, large pleural effusion or pneumothorax. IMPRESSION: 1. Endotracheal tube tip at  the thoracic inlet. Enteric tube in place with tip and side-port below the diaphragm not included in the field of view. 2. Low lung volumes with bibasilar atelectasis. Electronically Signed   By: Narda Rutherford M.D.   On: 01/20/2018 01:50   Dg Shoulder Left  Result Date: 01/19/2018 CLINICAL DATA:  Larey Seat today and injured left shoulder. EXAM: LEFT SHOULDER - 2+ VIEW COMPARISON:  None. FINDINGS: The joint spaces are maintained. No acute bony findings or bone lesion. No abnormal soft tissue calcifications. The visualized lung is clear and the visualized ribs are intact. IMPRESSION: No fracture or dislocation. Electronically Signed   By: Rudie Meyer M.D.   On: 01/19/2018 15:45   IMPRESSION AND PLAN:   A/P: 4M w/ Hx EtOH dependence/abuse/intoxication/withdrawal, p/w EtOH intoxication, fall, w/ subsequent AMS/agitation necessitating intubation. Hypokalemia, hypomagnesemia, hyperglycemia (w/ T2NIDDM), hypocalcemia, mild transaminasemia, leukopenia (w/ moderate neutropenia), thrombocytopenia, lipase elevation. -EtOH intoxication, fall, AMS/agitation, intubated: EtOH level 317 on  admission. Trauma imaging performed in ED (-). CIWA, Ativan, PRN, thiamine/folate/MVI. Pt subsequently w/ AMS/agitation, intubated. Admit to ICU, cardiac monitoring + pulse oximetry. Sedation, RASS -2. -Hypokalemia, hypomagnesemia: K+ 3.0, mag 1.4. Replete and monitor. -Hyperglycemia/T2NIDDM: SSI, hold PO antihyperglycemics. -Transaminsemia, leukopenia w/ moderate neutropenia, thrombocytopenia, lipase elevation: AST 52, ALT WNL. WBC 2.8, ANC 900. Plt 113. Lipase 97. Findings likely 2/2 acute EtOH abuse/intoxication, though there may also be a degree of underlying intrinsic liver disease that is contributing. Monitor. -c/w other home meds. -FEN/GI: NPO. -DVT PPx: Lovenox. -Code status: Full code. -Disposition: Admission, > 2 midnights.    All the records are reviewed and case discussed with ED provider. Management plans discussed with the patient, family and they are in agreement.  CODE STATUS: Full code.  TOTAL TIME TAKING CARE OF THIS PATIENT: 90 minutes.    Barbaraann Rondo M.D on 01/20/2018 at 4:42 AM  Between 7am to 6pm - Pager - [phone]  After 6pm go to [email or web address] - Social research officer, government  [facility]  Office  [phone]  CC: Primary care physician; [name], MD   Note: This dictation was prepared with Dragon dictation along with smaller phrase technology. Any transcriptional errors that result from this process are unintentional.

## 2018-01-20 NOTE — ED Notes (Signed)
External jugular PIV infiltrated with NaCl. MD Rifenbark at bedside. MD removed line.

## 2018-01-20 NOTE — ED Notes (Signed)
1 unsuccessful attempt at foley insertion by Lerry LinerMichele RN, witnessed by Smitty KnudsenJenna RN. No urine was returned, so foley was removed.

## 2018-01-20 NOTE — Progress Notes (Signed)
Pharmacy Electrolyte Monitoring Consult:  Pharmacy consulted to assist in monitoring and replacing electrolytes in this 57 y.o. male admitted on 01/19/2018 with Fall   Labs:  Sodium (mmol/L)  Date Value  01/19/2018 145   Potassium (mmol/L)  Date Value  01/19/2018 3.0 (L)   Magnesium (mg/dL)  Date Value  60/45/409809/15/2019 1.4 (L)   Phosphorus (mg/dL)  Date Value  11/91/478209/15/2019 2.9   Calcium (mg/dL)  Date Value  95/62/130809/14/2019 8.8 (L)   Albumin (g/dL)  Date Value  65/78/469609/14/2019 4.4  08/24/2016 4.2    Assessment/Plan: Patient admitted for alcohol w/d manifesting in tachycardia. Patient sedated and intubated. Patient is currently hypokalemic will replace w/ NS + KCl 20 mEq IV @ 50 ml/hr Patient is also ordered KCI 10 mEq IV x 4 and KCI PO 40 mEq x 1.  Will check BMP w/ am labs and will replace as needed.  Thomasene Rippleavid Riann Oman, PharmD, BCPS Clinical Pharmacist 01/20/2018

## 2018-01-20 NOTE — ED Notes (Signed)
1 unsuccessful attempt at foley cathter insertion by Severiano GilbertMegan RN, witnessed by [facility] Center[facility]enna RN. Foley would not advance into bladder. Will re attempt at a later time.

## 2018-01-20 NOTE — ED Notes (Signed)
Patient exhibiting seizure-like activity. MD Rifenbark ordered second bolus of propofol

## 2018-01-20 NOTE — ED Notes (Signed)
Patient hypotensive. MD Rifenbark to bedside.

## 2018-01-20 NOTE — ED Notes (Signed)
Patient requesting Imodium. MD informed.

## 2018-01-20 NOTE — ED Notes (Signed)
Patient biting tube and moving fingers. MD at bedside.

## 2018-01-20 NOTE — Plan of Care (Signed)
Patient tolerating meds. Medicated. Anxious and combative at times.  PRN Versed given. Replacing electrolytes.  Will continue to monitor.

## 2018-01-20 NOTE — ED Notes (Signed)
Patient biting tube. MD Rifenbark at bedside. MD ordered to administer Rocuronium.

## 2018-01-20 NOTE — ED Notes (Signed)
Patient tachycardic. MD informed. MD to bedside.

## 2018-01-20 NOTE — ED Notes (Signed)
Patient continues to be tachycardic post ativan administration. MD informed. MD to bedside. MD discussed the need for, risks and benefits of intubation with patient. Patient verbalized understanding. Patient gave verbal consent for procedure.

## 2018-01-20 NOTE — ED Notes (Signed)
MD Rifenbark, Charge RN Nelida GoresLea, Megan RN, Dee RT, and this RN at bedside for RSI.

## 2018-01-20 NOTE — ED Provider Notes (Signed)
[facility] Emergency Department Provider Note  ____________________________________________   First MD Initiated Contact with Patient 01/20/18 0129     (approximate)  I have reviewed the triage vital signs and the nursing notes.   HISTORY  Chief Complaint Fall   HPI Kurt Horton is a 57 y.o. male who was signed out to me pending  social work evaluation.  Briefly the patient is a severe alcoholic whose parents have kicked him out of the house.  He has been in the emergency department for 12 hours when I came on to shift and he is newly tachycardic, confused, diaphoretic, when I walked into the room he is in severe alcohol withdrawal.  I gave him 4 mg of IV lorazepam with essentially no effect.  I then followed this with 8 mg of IV lorazepam again with minimal effect.  I followed that with 260 mg of IV phenobarbital and the patient remained tachycardic to the 170s, diaphoretic, and obtunded.  Decision was made to intubate to facilitate high-dose propofol anticipating his descent into continued delirium tremens.  He was intubated without complication however following intubation he was somewhat hypotensive down to the 60s over 40s.  He had difficult IV access so decision was made to place a right femoral central line which was done in usual complete sterile conditions.  Levophed initiated and 2 more liters of fluids bolus.  Initially spoke with Dr. Duane Boston for admission however once the patient was intubated I spoke with Dr. Oren Bracket.  The patient is transferred to the intensive care unit on fentanyl, propofol, norepinephrine infusions in critical condition. _______ PROCEDURES  Procedure(s) performed: Yes  .Critical Care Performed by: Darel Hong, MD Authorized by: Darel Hong, MD   Critical care provider statement:    Critical care time (minutes):  65   Critical care time was exclusive of:  Separately billable procedures and treating other patients   Critical  care was necessary to treat or prevent imminent or life-threatening deterioration of the following conditions:  Respiratory failure   Critical care was time spent personally by me on the following activities:  Development of treatment plan with patient or surrogate, discussions with consultants, evaluation of patient's response to treatment, examination of patient, obtaining history from patient or surrogate, ordering and performing treatments and interventions, ordering and review of laboratory studies, ordering and review of radiographic studies, pulse oximetry, re-evaluation of patient's condition and review of old charts Procedure Name: Intubation Date/Time: 01/20/2018 1:32 AM Performed by: Darel Hong, MD Pre-anesthesia Checklist: Patient identified, Patient being monitored, Emergency Drugs available, Timeout performed and Suction available Oxygen Delivery Method: Nasal cannula Preoxygenation: Pre-oxygenation with 100% oxygen Induction Type: Rapid sequence Ventilation: Mask ventilation without difficulty Laryngoscope Size: Mac and 4 Grade View: Grade I Tube size: 8.0 mm Number of attempts: 1 Placement Confirmation: ETT inserted through vocal cords under direct vision,  CO2 detector and Breath sounds checked- equal and bilateral Secured at: 23 cm Tube secured with: ETT holder    .Central Line Date/Time: 01/20/2018 2:09 AM Performed by: Darel Hong, MD Authorized by: Darel Hong, MD   Consent:    Consent obtained:  Emergent situation Pre-procedure details:    Hand hygiene: Hand hygiene performed prior to insertion     Sterile barrier technique: All elements of maximal sterile technique followed     Skin preparation:  2% chlorhexidine Sedation:    Sedation type:  Deep Anesthesia (see MAR for exact dosages):    Anesthesia method:  Local infiltration  Local anesthetic:  Lidocaine 1% w/o epi Procedure details:    Location:  R femoral   Patient position:  Reverse  Trendelenburg   Procedural supplies:  Triple lumen   Landmarks identified: yes     Ultrasound guidance: no     Number of attempts:  2   Successful placement: yes   Post-procedure details:    Post-procedure:  Dressing applied and line sutured   Assessment:  Blood return through all ports   Patient tolerance of procedure:  Tolerated well, no immediate complications    Critical Care performed: Yes  ____________________________________________         ____________________________________________   FINAL CLINICAL IMPRESSION(S) / ED DIAGNOSES  Final diagnoses:  Alcoholism ([place])  Pulmonary nodule  Alcohol withdrawal syndrome, with delirium ([place])      NEW MEDICATIONS STARTED DURING THIS VISIT:  New Prescriptions   No medications on file     Note:  This document was prepared using Dragon voice recognition software and may include unintentional dictation errors.      Darel Hong, MD 01/20/18 [phone]

## 2018-01-20 NOTE — ED Notes (Signed)
Attempted to call report to floor. Primary RN unavailable. Was informed RN would return call.

## 2018-01-21 ENCOUNTER — Other Ambulatory Visit: Payer: Self-pay

## 2018-01-21 ENCOUNTER — Inpatient Hospital Stay: Payer: Medicaid Other

## 2018-01-21 DIAGNOSIS — F1010 Alcohol abuse, uncomplicated: Secondary | ICD-10-CM

## 2018-01-21 LAB — GLUCOSE, CAPILLARY
GLUCOSE-CAPILLARY: 101 mg/dL — AB (ref 70–99)
GLUCOSE-CAPILLARY: 102 mg/dL — AB (ref 70–99)
GLUCOSE-CAPILLARY: 141 mg/dL — AB (ref 70–99)
Glucose-Capillary: 143 mg/dL — ABNORMAL HIGH (ref 70–99)
Glucose-Capillary: 122 mg/dL — ABNORMAL HIGH (ref 70–99)

## 2018-01-21 LAB — COMPREHENSIVE METABOLIC PANEL
ALT: 19 U/L (ref 0–44)
AST: 39 U/L (ref 15–41)
Albumin: 3.4 g/dL — ABNORMAL LOW (ref 3.5–5.0)
Alkaline Phosphatase: 73 U/L (ref 38–126)
Anion gap: 8 (ref 5–15)
BILIRUBIN TOTAL: 0.9 mg/dL (ref 0.3–1.2)
BUN: 10 mg/dL (ref 6–20)
CHLORIDE: 105 mmol/L (ref 98–111)
CO2: 25 mmol/L (ref 22–32)
CREATININE: 0.65 mg/dL (ref 0.61–1.24)
Calcium: 8.1 mg/dL — ABNORMAL LOW (ref 8.9–10.3)
GFR calc Af Amer: >60 mL/min (ref >60)
Glucose, Bld: 124 mg/dL — ABNORMAL HIGH (ref 70–99)
Potassium: 3.4 mmol/L — ABNORMAL LOW (ref 3.5–5.1)
Sodium: 138 mmol/L (ref 135–145)
TOTAL PROTEIN: 6 g/dL — AB (ref 6.5–8.1)

## 2018-01-21 MED ORDER — LORAZEPAM 2 MG/ML IJ SOLN
1.0000 mg | Freq: Four times a day (QID) | INTRAMUSCULAR | Status: DC | PRN
  Administered 2018-01-21: 1 mg via INTRAVENOUS
  Filled 2018-01-21: qty 1

## 2018-01-21 MED ORDER — IPRATROPIUM-ALBUTEROL 0.5-2.5 (3) MG/3ML IN SOLN: 3.0000 mL | Freq: Four times a day (QID) | RESPIRATORY_TRACT | Status: DC | PRN

## 2018-01-21 MED ORDER — LORAZEPAM 1 MG PO TABS: 0.0000 mg | ORAL_TABLET | Freq: Two times a day (BID) | ORAL | Status: DC

## 2018-01-21 MED ORDER — ONE-A-DAY MENS PO TABS: 1.0000 | ORAL_TABLET | Freq: Every day | ORAL | Status: DC

## 2018-01-21 MED ORDER — LIDOCAINE 5 % EX PTCH
1.0000 | MEDICATED_PATCH | CUTANEOUS | Status: DC
  Administered 2018-01-21 – 2018-01-22 (×2): 1 via TRANSDERMAL
  Filled 2018-01-21 (×2): qty 1

## 2018-01-21 MED ORDER — B COMPLEX-C PO TABS
1.0000 | ORAL_TABLET | Freq: Every day | ORAL | Status: DC
  Administered 2018-01-21 – 2018-01-22 (×2): 1 via ORAL
  Filled 2018-01-21 (×2): qty 1

## 2018-01-21 MED ORDER — OXYCODONE HCL 5 MG PO TABS
10.0000 mg | ORAL_TABLET | Freq: Three times a day (TID) | ORAL | Status: DC | PRN
  Administered 2018-01-21 – 2018-01-22 (×3): 10 mg via ORAL
  Filled 2018-01-21 (×3): qty 2

## 2018-01-21 MED ORDER — ADULT MULTIVITAMIN W/MINERALS CH
1.0000 | ORAL_TABLET | Freq: Every day | ORAL | Status: DC
  Administered 2018-01-21 – 2018-01-22 (×2): 1 via ORAL
  Filled 2018-01-21 (×2): qty 1

## 2018-01-21 MED ORDER — INFLUENZA VAC SPLIT QUAD 0.5 ML IM SUSY: 0.5000 mL | PREFILLED_SYRINGE | INTRAMUSCULAR | Status: DC

## 2018-01-21 MED ORDER — DIPHENOXYLATE-ATROPINE 2.5-0.025 MG PO TABS
1.0000 | ORAL_TABLET | Freq: Four times a day (QID) | ORAL | Status: DC | PRN
  Administered 2018-01-21: 1 via ORAL
  Filled 2018-01-21: qty 1

## 2018-01-21 MED ORDER — SENNOSIDES 8.8 MG/5ML PO SYRP
5.0000 mL | ORAL_SOLUTION | Freq: Two times a day (BID) | ORAL | Status: DC | PRN
  Filled 2018-01-21: qty 5

## 2018-01-21 MED ORDER — LORAZEPAM 1 MG PO TABS
0.0000 mg | ORAL_TABLET | Freq: Four times a day (QID) | ORAL | Status: DC
  Administered 2018-01-21 (×2): 1 mg via ORAL
  Administered 2018-01-22: 3 mg via ORAL
  Filled 2018-01-21: qty 1
  Filled 2018-01-21 (×2): qty 2
  Filled 2018-01-21: qty 1

## 2018-01-21 MED ORDER — LORAZEPAM 1 MG PO TABS
1.0000 mg | ORAL_TABLET | Freq: Four times a day (QID) | ORAL | Status: DC | PRN
  Administered 2018-01-22: 1 mg via ORAL

## 2018-01-21 MED ORDER — METFORMIN HCL 500 MG PO TABS
500.0000 mg | ORAL_TABLET | Freq: Every day | ORAL | Status: DC
  Administered 2018-01-21: 500 mg via ORAL
  Filled 2018-01-21 (×2): qty 1

## 2018-01-21 MED ORDER — HYDROCODONE-ACETAMINOPHEN 5-325 MG PO TABS
1.0000 | ORAL_TABLET | Freq: Four times a day (QID) | ORAL | Status: DC | PRN
  Administered 2018-01-21 – 2018-01-22 (×3): 1 via ORAL
  Filled 2018-01-21 (×3): qty 1

## 2018-01-21 MED ORDER — DILTIAZEM HCL ER COATED BEADS 180 MG PO CP24
180.0000 mg | ORAL_CAPSULE | Freq: Every day | ORAL | Status: DC
  Administered 2018-01-22: 180 mg via ORAL
  Filled 2018-01-21 (×2): qty 1

## 2018-01-21 NOTE — Plan of Care (Signed)
Patient alert and oriented. Able to make needs known.  [facility]  Patient continues with tremors and weakness. Precedex stopped.  No behavior concerns at this time. Patient is assist of two. Patient has c/o back pain and left shoulder pain.  Left shoulder pain r/t fall, according to patient.  Pain 8/10.  PRN meds given for pain.  Will continue to monitor.

## 2018-01-21 NOTE — Progress Notes (Addendum)
[facility] [place] [facility]   PATIENT NAME: Kurt Horton    MR#:  [id number]  DATE OF BIRTH:  08/24/1960  SUBJECTIVE:   Was brought in from home after he was found lying on the floor covered in feces by EMS. Patient now extubated. Has some diarrhea. He did eat. Complains of back pain. He wants his oxycodone. REVIEW OF SYSTEMS:   Review of Systems  Constitutional: Negative for chills, fever and weight loss.  HENT: Negative for ear discharge, ear pain and nosebleeds.   Eyes: Negative for blurred vision, pain and discharge.  Respiratory: Negative for sputum production, shortness of breath, wheezing and stridor.   Cardiovascular: Negative for chest pain, palpitations, orthopnea and PND.  Gastrointestinal: Negative for abdominal pain, diarrhea, nausea and vomiting.  Genitourinary: Negative for frequency and urgency.  Musculoskeletal: Positive for back pain. Negative for joint pain.  Neurological: Positive for weakness. Negative for sensory change, speech change and focal weakness.  Psychiatric/Behavioral: Negative for depression and hallucinations. The patient is not nervous/anxious.     DRUG ALLERGIES:   Allergies  Allergen Reactions  . Nsaids Other (See Comments)    Duodenal ulcers, GI bleeding  . Other Other (See Comments)    Patient states he can't take any "mycin" medications because myasthenia gravis.    VITALS:  Blood pressure (!) 164/87, pulse 82, temperature 98.4 F (36.9 C), temperature source Oral, resp. rate 16, height 5\' 11"  (1.803 m), weight 84.9 kg, SpO2 100 %.  PHYSICAL EXAMINATION:   Physical Exam  GENERAL:  57 y.o.-year-old patient lying in the bed with no acute distress.disheveled EYES: Pupils equal, round, reactive to light and accommodation. No scleral icterus. Extraocular muscles intact.  HEENT: Head atraumatic, normocephalic. Oropharynx and nasopharynx clear.  NECK:  Supple, no jugular venous distention. No thyroid  enlargement, no tenderness.  LUNGS: Normal breath sounds bilaterally, no wheezing, rales, rhonchi. No use of accessory muscles of respiration.  CARDIOVASCULAR: S1, S2 normal. No murmurs, rubs, or gallops.  ABDOMEN: Soft, nontender, nondistended. Bowel sounds present. No organomegaly or mass.  EXTREMITIES: No cyanosis, clubbing or edema b/l.    NEUROLOGIC: moves all extremities well. Appears deconditioned and somewhat weak. Has baseline tremors.  PSYCHIATRIC: alert and oriented times three  SKIN: No obvious rash, lesion, or ulcer.   LABORATORY PANEL:  CBC Recent Labs  Lab 01/19/18 1244  WBC 2.8*  HGB 13.8  HCT 42.2  PLT 113*    Chemistries  Recent Labs  Lab 01/20/18 0145 01/21/18 0623  NA  --  138  K  --  3.4*  CL  --  105  CO2  --  25  GLUCOSE  --  124*  BUN  --  10  CREATININE  --  0.65  CALCIUM  --  8.1*  MG 1.4*  --   AST  --  39  ALT  --  19  ALKPHOS  --  73  BILITOT  --  0.9   Cardiac Enzymes No results for input(s): TROPONINI in the last 168 hours. RADIOLOGY:  Portable Chest Xray  Result Date: 01/21/2018 CLINICAL DATA:  Respiratory failure EXAM: PORTABLE CHEST 1 VIEW COMPARISON:  Yesterday FINDINGS: Interval extubation with improved lung volumes. No opacity or effusion. Borderline heart size with stable mediastinal contours. Scoliosis IMPRESSION: Improved aeration.  No evidence of active disease. Electronically Signed   By: Marnee SpringJonathon  Watts M.D.   On: 01/21/2018 07:14   Dg Chest Port 1 View  Result Date: 01/20/2018 CLINICAL DATA:  Intubation. EXAM: PORTABLE CHEST 1 VIEW COMPARISON:  Chest CT yesterday. FINDINGS: Endotracheal tube tip at the thoracic inlet. Enteric tube in place with tip and side-port below the diaphragm not included in the field of view. Lung volumes are low. Mild bibasilar atelectasis. Heart size upper normal likely accentuated by technique. No confluent airspace disease, pulmonary edema, large pleural effusion or pneumothorax. IMPRESSION: 1.  Endotracheal tube tip at the thoracic inlet. Enteric tube in place with tip and side-port below the diaphragm not included in the field of view. 2. Low lung volumes with bibasilar atelectasis. Electronically Signed   By: Narda Rutherford M.D.   On: 01/20/2018 01:50   Dg Shoulder Left  Result Date: 01/19/2018 CLINICAL DATA:  Larey Seat today and injured left shoulder. EXAM: LEFT SHOULDER - 2+ VIEW COMPARISON:  None. FINDINGS: The joint spaces are maintained. No acute bony findings or bone lesion. No abnormal soft tissue calcifications. The visualized lung is clear and the visualized ribs are intact. IMPRESSION: No fracture or dislocation. Electronically Signed   By: Rudie Meyer M.D.   On: 01/19/2018 15:45   ASSESSMENT AND PLAN:  [name] is a 57 year old gentleman with a past medical history remarkable for atrial fibrillation, diabetes, hypertension, myasthenia gravis, alcohol abuse,congestive heart failure with ejection fraction of 40% and diastolic dysfunction with brought in by EMS, reportedly found lying on the floor at home covered in feces  1. acute alcohol intoxication with significant agitation got intubated for airway protection. -Appreciate ICU attending input -continue [place] protocol -received IV fluids  -pt now extubated -patient relapses into heavy drinking as soon as he is discharged from the hospital. This apparently is 1/7 admission and last six months. I will do psych consult for severe alcohol abuse  2. hypomagnesimia/hypokalemia -received IV mag and K  3. A fib with RVR -pt is on amiodarone , CCB and metoprolol. Currently in sinus rhythm.   4. Type II diabetes sliding scale insulin  5. 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 Marcello Fennel -I will resume it. I have told patient I will not be giving him any more narcotics. I will not be writing up any prescriptions on discharge as  well. -pt is planning to get established at the Pain clinic  6. Discharge planning to home with his parents. CM for discharge planning  spoke with patient he will discharge to home tomorrow if continues to improve.  Case discussed with Care Management/Social Worker. Management plans discussed with the patient, family and they are in agreement.  CODE STATUS: full  DVT Prophylaxis: lovenox  TOTAL TIME TAKING CARE OF THIS PATIENT: *30* minutes.  >50% time spent on counselling and coordination of care  POSSIBLE D/C IN 1-2 DAYS, DEPENDING ON CLINICAL CONDITION.  Note: This dictation was prepared with Dragon dictation along with smaller phrase technology. Any transcriptional errors that result from this process are unintentional.  Enedina Finner M.D on 01/21/2018 at 1:46 PM  Between 7am to 6pm - Pager - [phone]  After 6pm go to [email or web address] - Social research officer, government  [facility]  Office  [phone]  CC: Primary care physician; [name], MDPatient ID: Camelia Phenes, male   DOB: Aug 15, 1960, 57 y.o.   MRN: [id number]

## 2018-01-21 NOTE — Progress Notes (Signed)
Pharmacy Electrolyte Monitoring Consult:  Pharmacy consulted to assist in monitoring and replacing electrolytes in this 57 y.o. male admitted on 01/19/2018 with Fall   Labs:  Sodium (mmol/L)  Date Value  01/21/2018 138   Potassium (mmol/L)  Date Value  01/21/2018 3.4 (L)   Magnesium (mg/dL)  Date Value  16/10/960409/15/2019 1.4 (L)   Phosphorus (mg/dL)  Date Value  54/09/811909/15/2019 2.9   Calcium (mg/dL)  Date Value  14/78/295609/16/2019 8.1 (L)   Albumin (g/dL)  Date Value  21/30/865709/16/2019 3.4 (L)  08/24/2016 4.2    Assessment/Plan: Patient admitted for alcohol w/d manifesting in tachycardia. Patient sedated and intubated.  09/16 @ 0630 K 3.4 will increase rate to 75 ml/hr and will recheck electrolytes w/ am labs.  Will check BMP w/ am labs and will replace as needed.  Thomasene Rippleavid Sindy Mccune, PharmD, BCPS Clinical Pharmacist 01/21/2018

## 2018-01-21 NOTE — Progress Notes (Signed)
Performed bedside swallow eval per Dr. Clovis FredricksonKasa's orders. Pt  is able to tolerate water and whole pills at this time.

## 2018-01-21 NOTE — Progress Notes (Addendum)
Pt being transferred to room 151. Report called to Irving BurtonEmily, RN. Pt and belongings transferred to room 151 without incident.

## 2018-01-21 NOTE — Progress Notes (Signed)
CHIEF COMPLAINT:   Chief Complaint  Patient presents with  . Fall    Subjective  +etoh abuse Extubated Alert and awake Transfer to gen med floor today     Objective   Examination:  General exam: Appears calm and comfortable  Respiratory system: Clear to auscultation. Respiratory effort normal. HEENT: [place]/AT, PERRLA, no thrush, no stridor. Cardiovascular system: S1 & S2 heard, RRR. No JVD, murmurs, rubs, gallops or clicks. No pedal edema. Gastrointestinal system: Abdomen is nondistended, soft and nontender. No organomegaly or masses felt. Normal bowel sounds heard. Central nervous system: Alert and oriented. No focal neurological deficits. Extremities: Symmetric 5 x 5 power. Skin: No rashes, lesions or ulcers Psychiatry: Judgement and insight appear normal. Mood & affect appropriate.   VITALS:  height is 5\' 11"  (1.803 m) and weight is 84.9 kg. His oral temperature is 99.1 F (37.3 C). His blood pressure is 168/93 (abnormal) and his pulse is 82. His respiration is 13 and oxygen saturation is 98%.   I personally reviewed Labs under Results section.  Radiology Reports Ct Head Wo Contrast  Result Date: 01/19/2018 CLINICAL DATA:  Found down, possible head trauma, ETOH EXAM: CT HEAD WITHOUT CONTRAST CT CERVICAL SPINE WITHOUT CONTRAST TECHNIQUE: Multidetector CT imaging of the head and cervical spine was performed following the standard protocol without intravenous contrast. Multiplanar CT image reconstructions of the cervical spine were also generated. COMPARISON:  CT head dated 08/17/2017 FINDINGS: CT HEAD FINDINGS Brain: No evidence of acute infarction, hemorrhage, hydrocephalus, extra-axial collection or mass lesion/mass effect. Mild cortical atrophy. Vascular: No hyperdense vessel or unexpected calcification. Skull: Normal. Negative for fracture or focal lesion. Sinuses/Orbits: Partial opacification of the right ethmoid sinuses. Mastoid air cells are clear. Other: None. CT  CERVICAL SPINE FINDINGS Alignment: Reversal of the normal cervical lordosis. Skull base and vertebrae: No acute fracture. No primary bone lesion or focal pathologic process. Soft tissues and spinal canal: No prevertebral fluid or swelling. No visible canal hematoma. Disc levels: Vertebral body heights are maintained. Mild multilevel degenerative changes. Spinal canal is patent. 4 mm anterolisthesis of C3 on C4.  5 mm anterolisthesis of C4 on C5. Upper chest: Visualized lung apices are clear. Other: Visualized thyroid is unremarkable. IMPRESSION: No evidence of acute intracranial abnormality. Mild cortical atrophy. No evidence of traumatic injury to the cervical spine. Mild multilevel degenerative changes. Anterolisthesis of C3 on C4 and C4 on C5, as above. Electronically Signed   By: Charline Bills M.D.   On: 01/19/2018 13:49   Ct Chest Wo Contrast  Result Date: 01/19/2018 CLINICAL DATA:  Found down, possible head injury.  ETOH. EXAM: CT CHEST WITHOUT CONTRAST TECHNIQUE: Multidetector CT imaging of the chest was performed following the standard protocol without IV contrast. COMPARISON:  None. FINDINGS: Cardiovascular: Heart size is within normal limits. No pericardial effusion. No thoracic aortic aneurysm. Mediastinum/Nodes: No hemorrhage or edema within the mediastinum. No mass or enlarged lymph nodes seen within the mediastinum or perihilar regions. Lungs/Pleura: Irregular nodular density is seen within the LEFT lower lobe, medial aspect, measuring approximately 1.3 cm greatest dimension (series 3, image 104). Lungs otherwise clear. No pleural effusion or pneumothorax. Upper Abdomen: Liver is diffusely low in density, indicating fatty infiltration. No acute findings. Musculoskeletal: No acute appearing osseous abnormality. Degenerative spondylitic changes scattered throughout the slightly scoliotic thoracic spine, mild to moderate in degree. IMPRESSION: 1. No acute/traumatic findings. 2. Single irregular  nodular density within the LEFT lower lobe, measuring 1.3 cm greatest dimension. Suspect nodular atelectasis. Consider one  of the following in 3 months for both low-risk and high-risk individuals: (a) repeat chest CT, (b) follow-up PET-CT, or (c) tissue sampling. This recommendation follows the consensus statement: Guidelines for Management of Incidental Pulmonary Nodules Detected on CT Images: From the Fleischner Society 2017; Radiology 2017; 284:228-243. 3. Lungs otherwise clear. No pneumonia or pulmonary edema. No pleural effusion or pneumothorax. 4. Fatty infiltration of the liver. Electronically Signed   By: Bary Richard M.D.   On: 01/19/2018 13:53   Ct Cervical Spine Wo Contrast  Result Date: 01/19/2018 CLINICAL DATA:  Found down, possible head trauma, ETOH EXAM: CT HEAD WITHOUT CONTRAST CT CERVICAL SPINE WITHOUT CONTRAST TECHNIQUE: Multidetector CT imaging of the head and cervical spine was performed following the standard protocol without intravenous contrast. Multiplanar CT image reconstructions of the cervical spine were also generated. COMPARISON:  CT head dated 08/17/2017 FINDINGS: CT HEAD FINDINGS Brain: No evidence of acute infarction, hemorrhage, hydrocephalus, extra-axial collection or mass lesion/mass effect. Mild cortical atrophy. Vascular: No hyperdense vessel or unexpected calcification. Skull: Normal. Negative for fracture or focal lesion. Sinuses/Orbits: Partial opacification of the right ethmoid sinuses. Mastoid air cells are clear. Other: None. CT CERVICAL SPINE FINDINGS Alignment: Reversal of the normal cervical lordosis. Skull base and vertebrae: No acute fracture. No primary bone lesion or focal pathologic process. Soft tissues and spinal canal: No prevertebral fluid or swelling. No visible canal hematoma. Disc levels: Vertebral body heights are maintained. Mild multilevel degenerative changes. Spinal canal is patent. 4 mm anterolisthesis of C3 on C4.  5 mm anterolisthesis of C4 on  C5. Upper chest: Visualized lung apices are clear. Other: Visualized thyroid is unremarkable. IMPRESSION: No evidence of acute intracranial abnormality. Mild cortical atrophy. No evidence of traumatic injury to the cervical spine. Mild multilevel degenerative changes. Anterolisthesis of C3 on C4 and C4 on C5, as above. Electronically Signed   By: Charline Bills M.D.   On: 01/19/2018 13:49   Portable Chest Xray  Result Date: 01/21/2018 CLINICAL DATA:  Respiratory failure EXAM: PORTABLE CHEST 1 VIEW COMPARISON:  Yesterday FINDINGS: Interval extubation with improved lung volumes. No opacity or effusion. Borderline heart size with stable mediastinal contours. Scoliosis IMPRESSION: Improved aeration.  No evidence of active disease. Electronically Signed   By: Marnee Spring M.D.   On: 01/21/2018 07:14   Dg Chest Port 1 View  Result Date: 01/20/2018 CLINICAL DATA:  Intubation. EXAM: PORTABLE CHEST 1 VIEW COMPARISON:  Chest CT yesterday. FINDINGS: Endotracheal tube tip at the thoracic inlet. Enteric tube in place with tip and side-port below the diaphragm not included in the field of view. Lung volumes are low. Mild bibasilar atelectasis. Heart size upper normal likely accentuated by technique. No confluent airspace disease, pulmonary edema, large pleural effusion or pneumothorax. IMPRESSION: 1. Endotracheal tube tip at the thoracic inlet. Enteric tube in place with tip and side-port below the diaphragm not included in the field of view. 2. Low lung volumes with bibasilar atelectasis. Electronically Signed   By: Narda Rutherford M.D.   On: 01/20/2018 01:50   Dg Shoulder Left  Result Date: 01/19/2018 CLINICAL DATA:  Larey Seat today and injured left shoulder. EXAM: LEFT SHOULDER - 2+ VIEW COMPARISON:  None. FINDINGS: The joint spaces are maintained. No acute bony findings or bone lesion. No abnormal soft tissue calcifications. The visualized lung is clear and the visualized ribs are intact. IMPRESSION: No fracture  or dislocation. Electronically Signed   By: Rudie Meyer M.D.   On: 01/19/2018 15:45  Assessment/Plan:  ETOH abuse with encephalopathy intubated for air way protection In emergency department patient became agitated and tachycardic diaphoretic tremulous and altered and was subsequently intubated  Extubated successfully Transfer to gen med floor Replace lytes Supportive care     Yesica Kemler Santiago Gladavid Londin Antone, M.D.  [facility]  Medical Director [facility] Medical Director [facility]

## 2018-01-21 NOTE — Progress Notes (Signed)
   01/21/18 2010  Clinical Encounter Type  Visited With Patient  Visit Type Initial;Spiritual support  Referral From Nurse  Consult/Referral To Chaplain  Spiritual Encounters  Spiritual Needs Prayer;Emotional   CH received an OR to educate the patient on the AD process. I am familiar with Mr. Kurt Horton as he has been a patient on several occassions. The patient is a alcoholic and has made many attempts to stop drinking. We talked at length about his addiction and when it started. Mr. 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 He has two adult children and is divorced from his first and only wife. We talked about ways to fight the addiction and the support that his son who is [id number] gives him. I will continue to check on the patient and encourage him to seek help with his alcoholism.

## 2018-01-21 NOTE — Care Management Note (Addendum)
Case Management Note  Patient Details  Name: Kurt PhenesJohn D Sciuto MRN: [id number] Date of Birth: [birth year]  Subjective/Objective:                 Seventh admission for etoh intoxication in the past 6 months.  He was intubated for airway protection and ha since been extubated. He has been discharged home from past admission and immediately starts drinking again.  There is no documentation present that patient has been found incompetent after he detoxes.  He lives with his parents and it is documented they do not want him to return back to their residence. Patient verbalizes knowledge of this and he says he does not have anywhere to go.  Says "I just found out I do not have medicaid anymore and I don't know why.  He is currently on the phone with social services   Action/Plan:     Expected Discharge Date:                  Expected Discharge Plan:     In-House Referral:     Discharge planning Services     Post Acute Care Choice:    Choice offered to:     DME Arranged:    DME Agency:     HH Arranged:    HH Agency:     Status of Service:     If discussed at [employer] Meetings, dates discussed:    Additional Comments:  Eber HongGreene, Kumari Sculley R, RN 01/21/2018, 9:34 AM

## 2018-01-21 NOTE — Evaluation (Signed)
Clinical/Bedside Swallow Evaluation Patient Details  Name: Kurt Horton MRN: [id number] Date of Birth: 10-04-1960  Today's Date: 01/21/2018 Time: SLP Start Time (ACUTE ONLY): 1100 SLP Stop Time (ACUTE ONLY): 1136 SLP Time Calculation (min) (ACUTE ONLY): 36 min  Past Medical History:  Past Medical History:  Diagnosis Date  . A-fib (HCC)   . Alcohol abuse   . Alcohol withdrawal (HCC) 11/12/2016  . Diabetes mellitus without complication (HCC)   . DJD (degenerative joint disease)   . Hypertension   . Myasthenia gravis (HCC)   . Renal disorder    Past Surgical History:  Past Surgical History:  Procedure Laterality Date  . AMPUTATION TOE Left 12/29/2015   Procedure: AMPUTATION TOE;  Surgeon: Gwyneth Revels, DPM;  Location: [facility] ORS;  Service: Podiatry;  Laterality: Left;  . PERIPHERAL VASCULAR CATHETERIZATION N/A 12/27/2015   Procedure: Lower Extremity Angiography;  Surgeon: Annice Needy, MD;  Location: [facility] INVASIVE CV LAB;  Service: Cardiovascular;  Laterality: N/A;  . right hip surgery    . right shoulder surgery     HPI:  Kurt Horton  is a 57 y.o. male with a known history of EtOH dependence/abuse/intoxication/ withdrawal, PAF (on Amio; no AC), chronic systolic + diastolic CHF (EF 40% w/ grade 1 diastolic dysfxn as of 02/26/2017 Echo), myasthenia (CellCept), T2NIDDM, HTN p/w EtOH intoxication, fall, w/ subsequent AMS/agitation necessitating intubation. He is already intubated at the time of my assessment. He is well known to the Emergency Department providers and staff. I admitted him to the hospital on 08/17 (2/2 EtOH intoxication). He was discharged several days later, and went home. He reportedly lives with his parents, who are themselves of advanced age. He was brought in by EMS in the early afternoon on Saturday 01/19/2018. He was reportedly found lying on the floor at home covered in feces. He claimed he had fallen, hit his L head and L shoulder, and lost consciousness at some point  during the previous evening. He had also reported drinking heavily daily, as much as he possibly can (unquantifiable). He has not taken his medications. He was to be admitted for monitoring, EtOH withdrawal protocol, but whilst still in ED, became agitated, tachycardic, diaphoretic, tremulous, altered and incontinent of stool. He was sedated with large doses of sedative medication, and was intubated. He has received bolus IVF, and was started on Levophed for hypotension. He has a R groin line. Extubated and transferred to gen med floor this AM.   Assessment / Plan / Recommendation Clinical Impression  The patient demonstrates no indicators of swallowing difficulties.  He is able to drink thin liquid via straw with no overt clinical indicators of aspiration.  He is able to masticate and swallow peanut butter graham crackers with no difficulty.  Recommend regular diet.  Further speech therapy is not indicated, while sign off.  Please re-consult speech if any swallowing or communication problems arise. SLP Visit Diagnosis: Dysphagia, unspecified (R13.10)    Aspiration Risk  No limitations    Diet Recommendation Regular;Thin liquid   Liquid Administration via: Cup;Straw Medication Administration: Whole meds with liquid Supervision: Staff to assist with self feeding    Other  Recommendations     Follow up Recommendations        Frequency and Duration            Prognosis        Swallow Study   General Date of Onset: 01/19/18 HPI: Kurt Horton  is a 57 y.o. male with a known  history of EtOH dependence/abuse/intoxication/ withdrawal, PAF (on Amio; no AC), chronic systolic + diastolic CHF (EF 16%40% w/ grade 1 diastolic dysfxn as of 02/26/2017 Echo), myasthenia (CellCept), T2NIDDM, HTN p/w EtOH intoxication, fall, w/ subsequent AMS/agitation necessitating intubation. He is already intubated at the time of my assessment. He is well known to the Emergency Department providers and staff. I admitted  him to the hospital on 08/17 (2/2 EtOH intoxication). He was discharged several days later, and went home. He reportedly lives with his parents, who are themselves of advanced age. He was brought in by EMS in the early afternoon on Saturday 01/19/2018. He was reportedly found lying on the floor at home covered in feces. He claimed he had fallen, hit his L head and L shoulder, and lost consciousness at some point during the previous evening. He had also reported drinking heavily daily, as much as he possibly can (unquantifiable). He has not taken his medications. He was to be admitted for monitoring, EtOH withdrawal protocol, but whilst still in ED, became agitated, tachycardic, diaphoretic, tremulous, altered and incontinent of stool. He was sedated with large doses of sedative medication, and was intubated. He has received bolus IVF, and was started on Levophed for hypotension. He has a R groin line. Extubated and transferred to gen med floor this AM. Type of Study: Bedside Swallow Evaluation Diet Prior to this Study: Regular Behavior/Cognition: Alert;Cooperative;Pleasant mood    Oral/Motor/Sensory Function Overall Oral Motor/Sensory Function: Within functional limits   Ice Chips     Thin Liquid Thin Liquid: Within functional limits Presentation: Self Fed;Straw    Nectar Thick     Honey Thick     Puree Puree: Within functional limits Presentation: Self Fed;Spoon   Solid     Solid: Within functional limits Presentation: Self Fed     Dollene PrimroseSusan G Vito Beg, MS/CCC- SLP  Tedd SiasAbernathy, Susie 01/21/2018,11:37 AM

## 2018-01-21 NOTE — Evaluation (Signed)
Physical Therapy Evaluation Patient Details Name: Kurt Horton MRN: [id number] DOB: [birth year] Today's Date: 01/21/2018   History of Present Illness  presented to ER secondary to fall, found on floor covered in feces after significant ETOH intake; admitted for management of ETOH abuse/withdrawal (requiring brief intubation for airway protection).  Clinical Impression  Upon evaluation, patient alert and oriented; follows commands and demonstrates fair insight into deficits.  Bilat Ue/LE strength and ROM grossly symmetrical and WFL for basic transfers and mobility; endorses baseline neuropathy, ankles distally (unchanged with this admission).  Mildly tremulous throughout evaluation, but consistent throughout despite position change or activity level. No overt buckling noted as result.  Currently able to complete bed mobility with sup; sit/stand, basic transfers and gait (50-250') with RW, cga/close sup.  Improved fluidity and overall safety with gait trials when using RW; improved mechanics and balance reactions.  Min cuing for walker position and postural extension.  Decreased cadence/gait speed (10' walk time, 12 seconds), indicative of increased fall risk.  Do recommend continued use of RW for all mobility at this time; patient voiced understanding/agreement (has RW in home). Would benefit from skilled PT to address above deficits and promote optimal return to PLOF; Recommend transition to HHPT upon discharge from acute hospitalization.     Follow Up Recommendations Home health PT    Equipment Recommendations  (has RW)    Recommendations for Other Services       Precautions / Restrictions Precautions Precautions: Fall Restrictions Weight Bearing Restrictions: No      Mobility  Bed Mobility Overal bed mobility: Needs Assistance Bed Mobility: Supine to Sit     Supine to sit: Supervision        Transfers Overall transfer level: Needs assistance Equipment used: Rolling walker  (2 wheeled) Transfers: Sit to/from Stand Sit to Stand: Min guard;Min assist            Ambulation/Gait Ambulation/Gait assistance: Min assist Gait Distance (Feet): 50 Feet Assistive device: None       General Gait Details: broad BOS with staggered, inconsistent stepping pattern.  Limited balance reactions evident; guarded trunk posturing with limited rotation and arm swing  Stairs            Wheelchair Mobility    Modified Rankin (Stroke Patients Only)       Balance Overall balance assessment: Needs assistance Sitting-balance support: Feet supported;No upper extremity supported Sitting balance-Leahy Scale: Good     Standing balance support: No upper extremity supported Standing balance-Leahy Scale: Poor                               Pertinent Vitals/Pain Pain Assessment: No/denies pain    Home Living Family/patient expects to be discharged to:: Private residence Living Arrangements: Parent Available Help at Discharge: Family Type of Home: House Home Access: Level entry     Home Layout: One level Home Equipment: Environmental consultant - 2 wheels;Cane - single point      Prior Function Level of Independence: Independent with assistive device(s)         Comments: Indep with use of SPC for ADLs, household and community mobilization; intermittent use of RW "when legs are wobbly".  does endorse multiple fall history.     Hand Dominance   Dominant Hand: Right    Extremity/Trunk Assessment   Upper Extremity Assessment Upper Extremity Assessment: Overall WFL for tasks assessed(grossly at least 4+/5, mildly tremulous)    Lower Extremity Assessment  Lower Extremity Assessment: Overall WFL for tasks assessed(grossly at least 4/5, mildly tremulous; baseline neuropathy ankles distally)       Communication   Communication: No difficulties  Cognition Arousal/Alertness: Awake/alert Behavior During Therapy: WFL for tasks assessed/performed Overall  Cognitive Status: Within Functional Limits for tasks assessed                                        General Comments      Exercises Other Exercises Other Exercises: 250' with RW, cga/close sup-improved fluidity and overall safety with gait trials; improved mechanics and balance reactions.  Min cuing for walker position and postural extension.  Decreased cadence/gait speed (10' walk time, 12 seconds), indicative of increased fall risk.  Do recommend continued use of RW for all mobility at this time; patient voiced understanding/agreement (has RW in home).   Assessment/Plan    PT Assessment Patient needs continued PT services  PT Problem List Decreased activity tolerance;Decreased balance;Decreased mobility;Decreased coordination;Decreased knowledge of use of DME;Decreased safety awareness;Decreased knowledge of precautions       PT Treatment Interventions DME instruction;Gait training;Functional mobility training;Therapeutic activities;Therapeutic exercise;Balance training;Neuromuscular re-education;Patient/family education    PT Goals (Current goals can be found in the Care Plan section)  Acute Rehab PT Goals Patient Stated Goal: to get my strength up PT Goal Formulation: With patient Time For Goal Achievement: 02/04/18 Potential to Achieve Goals: Good    Frequency Min 2X/week   Barriers to discharge Decreased caregiver support      Co-evaluation               AM-PAC PT "6 Clicks" Daily Activity  Outcome Measure Difficulty turning over in bed (including adjusting bedclothes, sheets and blankets)?: A Little Difficulty moving from lying on back to sitting on the side of the bed? : A Little Difficulty sitting down on and standing up from a chair with arms (e.g., wheelchair, bedside commode, etc,.)?: A Little Help needed moving to and from a bed to chair (including a wheelchair)?: A Little Help needed walking in hospital room?: A Little Help needed  climbing 3-5 steps with a railing? : A Little 6 Click Score: 18    End of Session Equipment Utilized During Treatment: Gait belt Activity Tolerance: Patient tolerated treatment well Patient left: in chair;with call bell/phone within reach;with chair alarm set Nurse Communication: Mobility status PT Visit Diagnosis: Muscle weakness (generalized) (M62.81);Difficulty in walking, not elsewhere classified (R26.2)    Time: [id number] PT Time Calculation (min) (ACUTE ONLY): 23 min   Charges:   PT Evaluation $PT Eval Low Complexity: 1 Low PT Treatments $Gait Training: 8-22 mins       Lenette Rau H. Manson PasseyBrown, PT, DPT, NCS 01/21/18, 4:21 PM [phone]

## 2018-01-21 NOTE — Clinical Social Work Note (Signed)
Clinical Social Work Assessment  Patient Details  Name: Kurt Horton MRN: [id number] Date of Birth: [birth year]  Date of referral:  01/21/18               Reason for consult:  [employer], Discharge Planning, Substance Use/ETOH Abuse                Permission sought to share information with:    Permission granted to share information::     Name::        Agency::     Relationship::     Contact Information:     Housing/Transportation Living arrangements for the past 2 months:  Single Family Home Source of Information:  Patient Patient Interpreter Needed:  None Criminal Activity/Legal Involvement Pertinent to Current Situation/Hospitalization:  No - Comment as needed Significant Relationships:  Parents Lives with:  Parents Do you feel safe going back to the place where you live?  Yes Need for family participation in patient care:  Yes (Comment)  Care giving concerns:  Patient lives in [place] with his mother and father.    Social Worker assessment / plan:  Holiday representative is familiar with patient from previous admissions. CSW received consult for housing concerns. Patient is not eligible for residential substance abuse treatment because he states he is disabled and can't work. Patients have to be willing to work to go into residential treatment programs. PT is recommending home health. CSW met with patient alone at bedside to discuss D/C plan. Patient was alert and oriented X4 and was sitting up in the bed. CSW introduced self and explained role of CSW department. Per patient in July 2019 he spent 2 weeks at [facility] inpatient substance abuse rehab and it worked for him. Patient reported that he has not followed up on outpatient treatment. Per patient he lives with his parents however he has applications in for apartment complexes. Per patient he still receives SSI/disability however he lost his medicaid because he did not complete the annual application. Per patient he has put a  call into his Insurance account manager. Per patient he still drinks alcohol daily. CSW provided emotional support. Per patient his plan is to return to his parents home pack up his stuff and move into an apartment. Per patient if an apartment is not available right away then he will stay with his parents. Per patient he does not drive and relies on his father for transportation. Patient reported that he can pay for a taxi and uber as well. CSW provided patient with a list of outpatient substance abuse resources including [facility] and [employer]. Patient accepted resources and thanked CSW for visit. Patient reported no other needs or concerns.   Employment status:  Disabled (Comment on whether or not currently receiving Disability) Insurance information:  Self Pay (Medicaid Pending) PT Recommendations:  Home with [place] / Referral to community resources:  Outpatient Substance Abuse Treatment Options  Patient/Family's Response to care:  Patient accepted outpatient substance abuse resources.   Patient/Family's Understanding of and Emotional Response to Diagnosis, Current Treatment, and Prognosis:  Patient was pleasant and thanked CSW for assistance.   Emotional Assessment Appearance:  Appears stated age Attitude/Demeanor/Rapport:    Affect (typically observed):  Accepting, Adaptable, Pleasant Orientation:  Oriented to Self, Oriented to Place, Oriented to  Time, Oriented to Situation Alcohol / Substance use:  Alcohol Use Psych involvement (Current and /or in the community):  No (Comment)  Discharge Needs  Concerns to be addressed:  Discharge  Planning Concerns Readmission within the last 30 days:  No Current discharge risk:  Substance Abuse Barriers to Discharge:  Continued Medical Work up   [employer], Veronia Beets, LCSW 01/21/2018, 5:27 PM

## 2018-01-21 NOTE — Progress Notes (Signed)
VSS. Pt A&OX4. Pt in no acute distress. Ativan administered this shift per CIWA orders. Will continue to monitor.

## 2018-01-21 NOTE — Consult Note (Signed)
  Psychiatry: Brief note.  Full note to follow.  57 year old man with alcohol dependence very well-known to me from my multiple prior consults.  Presentation to the hospital identical with his previous presentations with alcohol abuse and the multiple medical complications he suffers from them.  Stop by briefly to see him but he was involved in a conversation with the chaplain so I will check in on him tomorrow.  I doubt very much that there is anything more that we would have to offer him that has not been offered repeatedly in the past.  Nevertheless I will check in and provide a more complete note tomorrow.

## 2018-01-22 LAB — MYCOPHENOLIC ACID (CELLCEPT)
MPA Glucuronide: NOT DETECTED ug/mL (ref 15–125)
MPA: NOT DETECTED ug/mL (ref 1.0–3.5)

## 2018-01-22 LAB — CALCIUM, IONIZED: CALCIUM, IONIZED, SERUM: 4.1 mg/dL — AB (ref 4.5–5.6)

## 2018-01-22 LAB — COMPREHENSIVE METABOLIC PANEL
ALT: 22 U/L (ref 0–44)
ANION GAP: 7 (ref 5–15)
AST: 45 U/L — ABNORMAL HIGH (ref 15–41)
Albumin: 3.3 g/dL — ABNORMAL LOW (ref 3.5–5.0)
Alkaline Phosphatase: 67 U/L (ref 38–126)
BUN: 9 mg/dL (ref 6–20)
CHLORIDE: 105 mmol/L (ref 98–111)
CO2: 25 mmol/L (ref 22–32)
Calcium: 8.3 mg/dL — ABNORMAL LOW (ref 8.9–10.3)
Creatinine, Ser: 0.76 mg/dL (ref 0.61–1.24)
Glucose, Bld: 125 mg/dL — ABNORMAL HIGH (ref 70–99)
POTASSIUM: 3 mmol/L — AB (ref 3.5–5.1)
Sodium: 137 mmol/L (ref 135–145)
TOTAL PROTEIN: 6 g/dL — AB (ref 6.5–8.1)
Total Bilirubin: 0.4 mg/dL (ref 0.3–1.2)

## 2018-01-22 LAB — GLUCOSE, CAPILLARY
GLUCOSE-CAPILLARY: 117 mg/dL — AB (ref 70–99)
GLUCOSE-CAPILLARY: 119 mg/dL — AB (ref 70–99)

## 2018-01-22 MED ORDER — POTASSIUM CHLORIDE 20 MEQ PO PACK: 20.0000 meq | PACK | Freq: Two times a day (BID) | ORAL | Status: DC

## 2018-01-22 MED ORDER — POTASSIUM CHLORIDE CRYS ER 20 MEQ PO TBCR
40.0000 meq | EXTENDED_RELEASE_TABLET | ORAL | Status: AC
  Administered 2018-01-22 (×2): 40 meq via ORAL
  Filled 2018-01-22 (×2): qty 2

## 2018-01-22 NOTE — Discharge Instructions (Signed)
Patient advised to abstain from alcohol and stop smoking

## 2018-01-22 NOTE — Discharge Summary (Signed)
[facility] [place] [facility]   PATIENT NAME: Kurt Horton    MR#:  [id number]  DATE OF BIRTH:  10/17/1960  DATE OF ADMISSION:  01/19/2018 ADMITTING PHYSICIAN: Barbaraann Rondo, MD  DATE OF DISCHARGE: 01/22/2018  PRIMARY CARE PHYSICIAN: [name], MD    ADMISSION DIAGNOSIS:  Respiratory failure (HCC) [J96.90] Alcoholism (HCC) [F10.20] Pulmonary nodule [R91.1] Alcohol withdrawal syndrome, with delirium (HCC) [F10.231]  DISCHARGE DIAGNOSIS:  acute alcohol intoxication recurrent with severe alcohol abuse severe agitation requiring mechanical intubation now extubated doing well chronic back pain  on chronic narcotics (given by PCP)  SECONDARY DIAGNOSIS:   Past Medical History:  Diagnosis Date  . A-fib (HCC)   . Alcohol abuse   . Alcohol withdrawal (HCC) 11/12/2016  . Diabetes mellitus without complication (HCC)   . DJD (degenerative joint disease)   . Hypertension   . Myasthenia gravis (HCC)   . Renal disorder     HOSPITAL COURSE:   Kurt Horton is a 57 year old gentleman with a past medical history remarkable for atrial fibrillation, diabetes, hypertension, myasthenia gravis, alcohol abuse,congestive heart failure with ejection fraction of 40% and diastolic dysfunction with brought in by EMS, reportedly found lying on the floor at home covered in feces  1. acute alcohol intoxication with significant agitation got intubated for airway protection. -continue Ciwa protocol scoring 3 -received IV fluids  -post extubation doing well -patient relapses into heavy drinking as soon as he is discharged from the hospital. This apparently is 7th admission and last six months.  -Psych consult appreciated. Patient was also seen by Child psychotherapist. Resources for chronic alcoholism given  2. hypomagnesimia/hypokalemia -received IV mag and K -repeat orally  3. A fib with RVR -pt is on amiodarone , CCB and metoprolol. Currently in sinus rhythm.    4. Type II diabetes sliding scale insulin continue metformin  5. 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 Marcello Fennel -I will resume it. I have told patient I will not be giving him any more narcotics. I will not be writing up any prescriptions on discharge as well. -pt is planning to get established at the Pain clinic  6. Discharge planning to home with his parents. Home health PT CM for discharge planning  spoke with patient he will discharge today CONSULTS OBTAINED:  Treatment Team:  Barbaraann Rondo, MD Clapacs, Jackquline Denmark, MD  DRUG ALLERGIES:   Allergies  Allergen Reactions  . Nsaids Other (See Comments)    Duodenal ulcers, GI bleeding  . Other Other (See Comments)    Patient states he can't take any "mycin" medications because myasthenia gravis.    DISCHARGE MEDICATIONS:   Allergies as of 01/22/2018      Reactions   Nsaids Other (See Comments)   Duodenal ulcers, GI bleeding   Other Other (See Comments)   Patient states he can't take any "mycin" medications because myasthenia gravis.      Medication List    STOP taking these medications   acetaminophen 325 MG tablet Commonly known as:  TYLENOL   fentaNYL 75 MCG/HR Commonly known as:  DURAGESIC - dosed mcg/hr   LORazepam 1 MG tablet Commonly known as:  ATIVAN     TAKE these medications   amiodarone 200 MG tablet Commonly known as:  PACERONE Take 1 tablet (200 mg total) by mouth daily.   aspirin 325 MG EC tablet Take 1 tablet (325 mg  total) by mouth daily.   B-complex with vitamin C tablet Take 1 tablet by mouth daily.   bisacodyl 5 MG EC tablet Commonly known as:  DULCOLAX Take 1 tablet (5 mg total) by mouth daily as needed for moderate constipation.   citalopram 20 MG tablet Commonly known as:  CELEXA Take 1 tablet (20 mg total) by mouth daily.   diltiazem 180 MG 24 hr capsule Commonly  known as:  CARDIZEM CD Take 1 capsule (180 mg total) by mouth daily.   folic acid 1 MG tablet Commonly known as:  FOLVITE Take 1 tablet (1 mg total) by mouth daily.   gabapentin 300 MG capsule Commonly known as:  NEURONTIN Take 2 capsules (600 mg total) by mouth 3 (three) times daily.   metFORMIN 500 MG tablet Commonly known as:  GLUCOPHAGE Take 1 tablet (500 mg total) by mouth daily.   metoprolol succinate 50 MG 24 hr tablet Commonly known as:  TOPROL-XL Take 1 tablet (50 mg total) by mouth daily. Take with or immediately following a meal.   multivitamin Tabs tablet Take 1 tablet by mouth daily.   mycophenolate 500 MG tablet Commonly known as:  CELLCEPT Take 1 tablet (500 mg total) by mouth 2 (two) times daily. Take 1 tablet (500MG ) by mouth every morning and 2 tablets (1000MG ) by mouth every evening   oxyCODONE 15 MG immediate release tablet Commonly known as:  ROXICODONE Take 1 tablet (15 mg total) by mouth every 8 (eight) hours as needed for pain. What changed:    how much to take  when to take this   senna-docusate 8.6-50 MG tablet Commonly known as:  Senokot-S Take 1 tablet by mouth at bedtime as needed for mild constipation.   testosterone cypionate 100 MG/ML injection Commonly known as:  DEPOTESTOTERONE CYPIONATE Inject into the muscle every 14 (fourteen) days. For IM use only   thiamine 100 MG tablet Take 1 tablet (100 mg total) by mouth daily.       If you experience worsening of your admission symptoms, develop shortness of breath, life threatening emergency, suicidal or homicidal thoughts you must seek medical attention immediately by calling 911 or calling your MD immediately  if symptoms less severe.  You Must read complete instructions/literature along with all the possible adverse reactions/side effects for all the Medicines you take and that have been prescribed to you. Take any new Medicines after you have completely understood and accept all the  possible adverse reactions/side effects.   Please note  You were cared for by a hospitalist during your hospital stay. If you have any questions about your discharge medications or the care you received while you were in the hospital after you are discharged, you can call the unit and asked to speak with the hospitalist on call if the hospitalist that took care of you is not available. Once you are discharged, your primary care physician will handle any further medical issues. Please note that NO REFILLS for any discharge medications will be authorized once you are discharged, as it is imperative that you return to your primary care physician (or establish a relationship with a primary care physician if you do not have one) for your aftercare needs so that they can reassess your need for medications and monitor your lab values. Today   SUBJECTIVE   Improving. chronic back pain  VITAL SIGNS:  Blood pressure (!) 158/90, pulse 85, temperature 98.6 F (37 C), temperature source Oral, resp. rate 18, height 5\' 11"  (  1.803 m), weight 83.5 kg, SpO2 98 %.  I/O:    Intake/Output Summary (Last 24 hours) at 01/22/2018 0800 Last data filed at 01/22/2018 0524 Gross per 24 hour  Intake 434.8 ml  Output 600 ml  Net -165.2 ml    PHYSICAL EXAMINATION:  GENERAL:  57 y.o.-year-old patient lying in the bed with no acute distress. Disheveled, EYES: Pupils equal, round, reactive to light and accommodation. No scleral icterus. Extraocular muscles intact.  HEENT: Head atraumatic, normocephalic. Oropharynx and nasopharynx clear.  NECK:  Supple, no jugular venous distention. No thyroid enlargement, no tenderness.  LUNGS: Normal breath sounds bilaterally, no wheezing, rales,rhonchi or crepitation. No use of accessory muscles of respiration.  CARDIOVASCULAR: S1, S2 normal. No murmurs, rubs, or gallops.  ABDOMEN: Soft, non-tender, non-distended. Bowel sounds present. No organomegaly or mass.  EXTREMITIES: No pedal  edema, cyanosis, or clubbing.  NEUROLOGIC: Cranial nerves II through XII are intact. Muscle strength 5/5 in all extremities. Sensation intact. Gait not checked.  PSYCHIATRIC: The patient is alert and oriented x 3.  SKIN: No obvious rash, lesion, or ulcer.   DATA REVIEW:   CBC  Recent Labs  Lab 01/19/18 1244  WBC 2.8*  HGB 13.8  HCT 42.2  PLT 113*    Chemistries  Recent Labs  Lab 01/20/18 0145  01/22/18 0339  NA  --    < > 137  K  --    < > 3.0*  CL  --    < > 105  CO2  --    < > 25  GLUCOSE  --    < > 125*  BUN  --    < > 9  CREATININE  --    < > 0.76  CALCIUM  --    < > 8.3*  MG 1.4*  --   --   AST  --    < > 45*  ALT  --    < > 22  ALKPHOS  --    < > 67  BILITOT  --    < > 0.4   < > = values in this interval not displayed.    Microbiology Results   Recent Results (from the past 240 hour(s))  MRSA PCR Screening     Status: None   Collection Time: 01/20/18  3:39 AM  Result Value Ref Range Status   MRSA by PCR NEGATIVE NEGATIVE Final    Comment:        The GeneXpert MRSA Assay (FDA approved for NASAL specimens only), is one component of a comprehensive MRSA colonization surveillance program. It is not intended to diagnose MRSA infection nor to guide or monitor treatment for MRSA infections. Performed at [facility], [address] [place], [place] [address]     RADIOLOGY:  Portable Chest Xray  Result Date: 01/21/2018 CLINICAL DATA:  Respiratory failure EXAM: PORTABLE CHEST 1 VIEW COMPARISON:  Yesterday FINDINGS: Interval extubation with improved lung volumes. No opacity or effusion. Borderline heart size with stable mediastinal contours. Scoliosis IMPRESSION: Improved aeration.  No evidence of active disease. Electronically Signed   By: Marnee Spring M.D.   On: 01/21/2018 07:14     Management plans discussed with the patient, family and they are in agreement.  CODE STATUS:     Code Status Orders  (From admission, onward)          Start     Ordered   01/20/18 0141  Full code  Continuous     01/20/18  0140        Code Status History    Date Active Date Inactive Code Status Order ID Comments User Context   01/19/2018 1239 01/20/2018 0140 Full Code [id number]  Myrna BlazerSchaevitz, David Matthew, MD ED   12/22/2017 0841 12/25/2017 2031 Full Code [id number]  Barbaraann RondoSridharan, Prasanna, MD Inpatient   10/27/2017 1522 11/02/2017 1432 Full Code [id number]  Milagros LollSudini, Srikar, MD ED   10/21/2017 1945 10/23/2017 1833 Full Code [id number]  Auburn BilberryPatel, Shreyang, MD Inpatient   10/21/2017 1504 10/21/2017 1945 Full Code [id number]  Arnaldo NatalMalinda, Paul F, MD ED   10/10/2017 1314 10/11/2017 1147 Full Code [id number]  Willy Eddyobinson, Patrick, MD ED   10/05/2017 2214 10/07/2017 2101 Full Code [id number]  Arnaldo NatalMalinda, Paul F, MD ED   10/05/2017 1924 10/05/2017 2214 Full Code [id number]  Arnaldo NatalMalinda, Paul F, MD ED   10/01/2017 0807 20-Nov-202019 1650 Full Code [id number]  Barbaraann RondoSridharan, Prasanna, MD Inpatient   08/24/2017 1756 08/27/2017 1849 Full Code [id number]  Enid BaasKalisetti, Radhika, MD ED   08/14/2017 0030 08/18/2017 1605 Full Code [id number]  Oralia ManisWillis, David, MD Inpatient   06/04/2017 0116 06/06/2017 1701 Full Code [id number]  Oralia ManisWillis, David, MD Inpatient   03/19/2017 1559 03/20/2017 2128 Full Code [id number]  Arnaldo NatalMalinda, Paul F, MD ED   02/25/2017 1421 03/01/2017 1829 Full Code [id number]  Houston SirenSainani, Vivek J, MD Inpatient   11/12/2016 1533 11/14/2016 1425 Full Code [id number]  Marguarite ArbourSparks, Jeffrey D, MD Inpatient   12/26/2015 1927 12/30/2015 1604 Full Code [id number]  Marguarite ArbourSparks, Jeffrey D, MD Inpatient      TOTAL TIME TAKING CARE OF THIS PATIENT: *40* minutes.    Enedina FinnerSona Skeeter Sheard M.D on 01/22/2018 at 8:00 AM  Between 7am to 6pm - Pager - [phone] After 6pm go to [email or web address] - Social research officer, governmentpassword [email or web address]  [facility]  Office  [phone]  CC: Primary care physician; Barbette ReichmannHande, Vishwanath, MD

## 2018-01-22 NOTE — Care Management Note (Addendum)
Case Management Note  Patient Details  Name: Kurt Horton MRN: [id number] Date of Birth: 30-Mar-1961  Subjective/Objective:   PT recommending home health PT. Met with patient at bedside. He lives at home with his elderly parents. Multiple readmission due to ETOH. He is uninsured. Offered home health PT with [facility]. Patient is agreeable. Referral to [facility] with [facility] for HHPT and charity program. He will come and speak with patient. No DME needs.  PCP is Dr. Ginette Pitman                Action/Plan:   Expected Discharge Date:  01/22/18               Expected Discharge Plan:     In-House Referral:     Discharge planning Services  CM Consult  Post Acute Care Choice:  Home Health Choice offered to:  Patient  DME Arranged:    DME Agency:     HH Arranged:  PT [place]:  [place]  Status of Service:  Completed, signed off  If discussed at [place] of Stay Meetings, dates discussed:    Additional Comments:  Jolly Mango, RN 01/22/2018, 11:08 AM

## 2018-01-22 NOTE — Progress Notes (Signed)
DISCHARGE NOTE:  Pt given discharge instructions. Pt verbalized understanding. Pt wheeled to car by staff.  

## 2018-01-22 NOTE — Progress Notes (Signed)
Initial Nutrition Assessment  DOCUMENTATION CODES:   Not applicable  INTERVENTION:  Advise continue MVI daily after discharge   NUTRITION DIAGNOSIS:   Inadequate oral intake related to chronic illness(alcohol dependence ) as evidenced by per patient/family report.  GOAL:   Patient will meet greater than or equal to 90% of their needs   MONITOR:   PO intake, Labs, Skin, Weight trends  REASON FOR ASSESSMENT:   Malnutrition Screening Tool    ASSESSMENT:   57 y.o. Male w/ PMHx of EtOH dependence, DM, chronic back pain, myasthenia gravis, PVD, HTN, and osteomyelitis sent to ED after being found unconscious in his home after a fall. Pt admitted for subsequent alcohol withdrawal symptoms.   Visited pt in room today prior to discharge. Pt states his appetite is good while in the hospital but that when he is drinking he only eats one meal a day, usually an early dinner eaten outside the home. He reports his parents are elderly and losing the ability to prepare meals. Pt reports taking his multivitamin "when he remembers". Stressed the importance to the pt about regularly taking a multivitamin to avoid common nutrient deficiencies seen in alcoholism. He reports neuropathy in his feet which results in sometimes not being able to walk and falling. He says he only keeps up with his diabetes medications when he remembers to. He avoids taking his metformin when not eating due to a previous episode of hypoglycemia. No reports of N/V but he regularly experiences diarrhea. He says that he was drinking less when he lived alone and has plans to move out of his parents' house and get a part-time job (is currently retired). He expresses desire to begin exercising with help of PT and seems receptive to nutrition-related information at this time.   Medications reviewed and include: B complex w/ Vit C, aspirin, Celexa, Lovenox, folic acid, gabapentin, insulin, Ativan, metformin, MVI, Toprol-XL, potassium,  thiamine.  Labs reviewed: CBG 117 (H), Potassium 3.0 (L), Calcium 8.3 (L), Albumin 3.3 (L), AST 45 (H), Total Protein 6.0 (L)   NUTRITION - FOCUSED PHYSICAL EXAM:    Most Recent Value  Orbital Region  No depletion  Upper Arm Region  No depletion  Thoracic and Lumbar Region  No depletion  Buccal Region  No depletion  Temple Region  No depletion  Clavicle Bone Region  Mild depletion  Clavicle and Acromion Bone Region  Mild depletion  Scapular Bone Region  No depletion  Dorsal Hand  No depletion  Patellar Region  No depletion  Anterior Thigh Region  No depletion  Posterior Calf Region  No depletion  Edema (RD Assessment)  None  Hair  Reviewed  Eyes  Reviewed  Mouth  Reviewed  Skin  Reviewed [Pale,  laceration on forehead]  Nails  Reviewed       Diet Order:   Diet Order            Diet - low sodium heart healthy        Diet regular Room service appropriate? Yes; Fluid consistency: Thin  Diet effective now              EDUCATION NEEDS:   No education needs have been identified at this time  Skin:  Skin Assessment: Reviewed RN Assessment(Abraison to forehead)  Last BM:   9/16  Height:   Ht Readings from Last 1 Encounters:  01/19/18 5\' 11"  (1.803 m)    Weight:   Wt Readings from Last 1 Encounters:  01/22/18 83.5 kg  Ideal Body Weight:  78 kg  BMI:  Body mass index is 25.67 kg/m.  Estimated Nutritional Needs:   Kcal:  2000-2300  Protein:  100-115  Fluid:  2.0-2.3 L    Alexea Blase, Dietetic Intern [phone]

## 2018-01-22 NOTE — Progress Notes (Addendum)
Pharmacy Electrolyte Monitoring Consult:  Pharmacy consulted to assist in monitoring and replacing electrolytes in this 57 y.o. male admitted on 01/19/2018 with Fall   Labs:  Sodium (mmol/L)  Date Value  01/22/2018 137   Potassium (mmol/L)  Date Value  01/22/2018 3.0 (L)   Magnesium (mg/dL)  Date Value  16/10/960409/15/2019 1.4 (L)   Phosphorus (mg/dL)  Date Value  54/09/811909/15/2019 2.9   Calcium (mg/dL)  Date Value  14/78/295609/17/2019 8.3 (L)   Albumin (g/dL)  Date Value  21/30/865709/17/2019 3.3 (L)  08/24/2016 4.2    Assessment/Plan: Patient admitted for alcohol w/d manifesting in tachycardia. Patient was sedated and intubated, now extubated and on 1A  K=3.0. Pt is to be d/c potentially today. I will give KCL 40 MEQ q 4 hr x 2 doses and recheck at 1500.  Olene FlossMelissa D Maccia, Pharm.D, BCPS Clinical Pharmacist 01/22/18 5:58 AM

## 2018-02-03 ENCOUNTER — Other Ambulatory Visit: Payer: Self-pay

## 2018-02-03 ENCOUNTER — Emergency Department
Admission: EM | Admit: 2018-02-03 | Discharge: 2018-02-04 | Disposition: A | Discharge status: Home / Self Care | Payer: Medicaid Other | Source: Home / Self Care | Attending: Emergency Medicine | Admitting: Emergency Medicine

## 2018-02-03 DIAGNOSIS — F17210 Nicotine dependence, cigarettes, uncomplicated: Secondary | ICD-10-CM | POA: Insufficient documentation

## 2018-02-03 DIAGNOSIS — I5042 Chronic combined systolic (congestive) and diastolic (congestive) heart failure: Secondary | ICD-10-CM | POA: Insufficient documentation

## 2018-02-03 DIAGNOSIS — Z7982 Long term (current) use of aspirin: Secondary | ICD-10-CM | POA: Insufficient documentation

## 2018-02-03 DIAGNOSIS — F10229 Alcohol dependence with intoxication, unspecified: Secondary | ICD-10-CM

## 2018-02-03 DIAGNOSIS — E119 Type 2 diabetes mellitus without complications: Secondary | ICD-10-CM | POA: Insufficient documentation

## 2018-02-03 DIAGNOSIS — I110 Hypertensive heart disease with heart failure: Secondary | ICD-10-CM | POA: Insufficient documentation

## 2018-02-03 DIAGNOSIS — Y908 Blood alcohol level of 240 mg/100 ml or more: Secondary | ICD-10-CM | POA: Insufficient documentation

## 2018-02-03 DIAGNOSIS — F10920 Alcohol use, unspecified with intoxication, uncomplicated: Secondary | ICD-10-CM

## 2018-02-03 DIAGNOSIS — Z7984 Long term (current) use of oral hypoglycemic drugs: Secondary | ICD-10-CM

## 2018-02-03 DIAGNOSIS — Z79899 Other long term (current) drug therapy: Secondary | ICD-10-CM

## 2018-02-03 LAB — COMPREHENSIVE METABOLIC PANEL
ALBUMIN: 4 g/dL (ref 3.5–5.0)
ALT: 39 U/L (ref 0–44)
AST: 53 U/L — AB (ref 15–41)
Alkaline Phosphatase: 94 U/L (ref 38–126)
Anion gap: 11 (ref 5–15)
BUN: 10 mg/dL (ref 6–20)
CHLORIDE: 104 mmol/L (ref 98–111)
CO2: 28 mmol/L (ref 22–32)
CREATININE: 0.81 mg/dL (ref 0.61–1.24)
Calcium: 8.3 mg/dL — ABNORMAL LOW (ref 8.9–10.3)
GFR calc Af Amer: >60 mL/min (ref >60)
GFR calc non Af Amer: >60 mL/min (ref >60)
GLUCOSE: 138 mg/dL — AB (ref 70–99)
POTASSIUM: 4.7 mmol/L (ref 3.5–5.1)
Sodium: 143 mmol/L (ref 135–145)
Total Bilirubin: 0.4 mg/dL (ref 0.3–1.2)
Total Protein: 7 g/dL (ref 6.5–8.1)

## 2018-02-03 LAB — URINE DRUG SCREEN, QUALITATIVE (ARMC ONLY)
AMPHETAMINES, UR SCREEN: NOT DETECTED
Barbiturates, Ur Screen: POSITIVE — AB
Benzodiazepine, Ur Scrn: POSITIVE — AB
CANNABINOID 50 NG, UR ~~LOC~~: NOT DETECTED
COCAINE METABOLITE, UR ~~LOC~~: NOT DETECTED
MDMA (ECSTASY) UR SCREEN: NOT DETECTED
Methadone Scn, Ur: NOT DETECTED
OPIATE, UR SCREEN: NOT DETECTED
PHENCYCLIDINE (PCP) UR S: NOT DETECTED
Tricyclic, Ur Screen: NOT DETECTED

## 2018-02-03 LAB — CBC
HEMATOCRIT: 43.4 % (ref 40.0–52.0)
Hemoglobin: 14.1 g/dL (ref 13.0–18.0)
MCH: 26.7 pg (ref 26.0–34.0)
MCHC: 32.5 g/dL (ref 32.0–36.0)
MCV: 82.4 fL (ref 80.0–100.0)
PLATELETS: 469 10*3/uL — AB (ref 150–440)
RBC: 5.26 MIL/uL (ref 4.40–5.90)
RDW: 21.7 % — AB (ref 11.5–14.5)
WBC: 5 10*3/uL (ref 3.8–10.6)

## 2018-02-03 LAB — ETHANOL: Alcohol, Ethyl (B): 391 mg/dL (ref <10)

## 2018-02-03 MED ORDER — LORAZEPAM 2 MG/ML IJ SOLN
1.0000 mg | Freq: Once | INTRAMUSCULAR | Status: AC
  Administered 2018-02-03: 1 mg via INTRAVENOUS
  Filled 2018-02-03: qty 1

## 2018-02-03 MED ORDER — LORAZEPAM 2 MG/ML IJ SOLN
2.0000 mg | Freq: Once | INTRAMUSCULAR | Status: AC
  Administered 2018-02-03: 2 mg via INTRAVENOUS
  Filled 2018-02-03: qty 1

## 2018-02-03 MED ORDER — ONDANSETRON HCL 4 MG/2ML IJ SOLN
INTRAMUSCULAR | Status: AC
  Administered 2018-02-03: 4 mg via INTRAVENOUS
  Filled 2018-02-03: qty 2

## 2018-02-03 MED ORDER — SODIUM CHLORIDE 0.9 % IV SOLN
1000.0000 mL | Freq: Once | INTRAVENOUS | Status: AC
  Administered 2018-02-03: 1000 mL via INTRAVENOUS

## 2018-02-03 MED ORDER — ONDANSETRON HCL 4 MG/2ML IJ SOLN
4.0000 mg | Freq: Once | INTRAMUSCULAR | Status: AC
  Administered 2018-02-03: 4 mg via INTRAVENOUS

## 2018-02-03 NOTE — ED Provider Notes (Signed)
[facility] Emergency Department Provider Note   ____________________________________________    I have reviewed the triage vital signs and the nursing notes.   HISTORY  Chief Complaint Alcohol Intoxication     HPI FREDIS MALKIEWICZ is a 57 y.o. male with history of atrial fibrillation, severe alcohol abuse, diabetes who presents today because of alcohol intoxication reportedly.  Patient is covered in feces.  Recent admission for alcohol withdrawal.  Patient is requesting Ativan because he has "the shakes.  "He states that the last time he had alcohol was 9 AM this morning.  That does not seem to fit with what EMS is reporting.   Past Medical History:  Diagnosis Date  . A-fib (HCC)   . Alcohol abuse   . Alcohol withdrawal (HCC) 11/12/2016  . Diabetes mellitus without complication (HCC)   . DJD (degenerative joint disease)   . Hypertension   . Myasthenia gravis (HCC)   . Renal disorder     Patient Active Problem List   Diagnosis Date Noted  . Alcohol intoxication (HCC) 12/22/2017  . Malnutrition of moderate degree 10/04/2017  . Chronic combined systolic and diastolic CHF (congestive heart failure) (HCC) 08/13/2017  . Alcohol withdrawal delirium (HCC) 02/26/2017  . Atrial fibrillation with RVR (HCC) 02/25/2017  . Acute alcoholic intoxication with complication (HCC)   . Alcohol abuse   . Alcohol withdrawal (HCC) 11/13/2016  . Substance induced mood disorder (HCC) 11/13/2016  . SVT (supraventricular tachycardia) (HCC) 11/12/2016  . HTN (hypertension) 11/12/2016  . EtOH dependence (HCC) 11/12/2016  . Lumbar radiculopathy 08/24/2016  . Depression with anxiety 08/24/2016  . Chronic low back pain 01/04/2016  . Pressure ulcer 12/29/2015  . Osteomyelitis (HCC) 12/26/2015  . PVD (peripheral vascular disease) (HCC) 12/26/2015  . Type II diabetes mellitus with manifestations (HCC) 12/26/2015  . Myasthenia gravis (HCC) 12/26/2015    Past Surgical  History:  Procedure Laterality Date  . AMPUTATION TOE Left 12/29/2015   Procedure: AMPUTATION TOE;  Surgeon: Gwyneth Revels, DPM;  Location: [facility] ORS;  Service: Podiatry;  Laterality: Left;  . PERIPHERAL VASCULAR CATHETERIZATION N/A 12/27/2015   Procedure: Lower Extremity Angiography;  Surgeon: Annice Needy, MD;  Location: [facility] INVASIVE CV LAB;  Service: Cardiovascular;  Laterality: N/A;  . right hip surgery    . right shoulder surgery      Prior to Admission medications   Medication Sig Start Date End Date Taking? Authorizing Provider  amiodarone (PACERONE) 200 MG tablet Take 1 tablet (200 mg total) by mouth daily. 11/02/17   Alford Highland, MD  aspirin 325 MG EC tablet Take 1 tablet (325 mg total) by mouth daily. 11/02/17   Alford Highland, MD  B Complex-C (B-COMPLEX WITH VITAMIN C) tablet Take 1 tablet by mouth daily. 11/02/17   Alford Highland, MD  bisacodyl (DULCOLAX) 5 MG EC tablet Take 1 tablet (5 mg total) by mouth daily as needed for moderate constipation. 12/25/17   Mayo, Allyn Kenner, MD  citalopram (CELEXA) 20 MG tablet Take 1 tablet (20 mg total) by mouth daily. 11/02/17   Alford Highland, MD  diltiazem (CARDIZEM CD) 180 MG 24 hr capsule Take 1 capsule (180 mg total) by mouth daily. 11/02/17 03/02/18  Alford Highland, MD  folic acid (FOLVITE) 1 MG tablet Take 1 tablet (1 mg total) by mouth daily. 12/26/17   Mayo, Allyn Kenner, MD  gabapentin (NEURONTIN) 300 MG capsule Take 2 capsules (600 mg total) by mouth 3 (three) times daily. 11/02/17   Wieting, Richard,  MD  metFORMIN (GLUCOPHAGE) 500 MG tablet Take 1 tablet (500 mg total) by mouth daily. 11/02/17   Alford Highland, MD  metoprolol succinate (TOPROL-XL) 50 MG 24 hr tablet Take 1 tablet (50 mg total) by mouth daily. Take with or immediately following a meal. 11/02/17   Alford Highland, MD  multivitamin (ONE-A-DAY MEN'S) TABS tablet Take 1 tablet by mouth daily. 11/02/17   Alford Highland, MD  mycophenolate (CELLCEPT) 500 MG tablet Take 1  tablet (500 mg total) by mouth 2 (two) times daily. Take 1 tablet (500MG ) by mouth every morning and 2 tablets (1000MG ) by mouth every evening 11/02/17   Alford Highland, MD  oxyCODONE (ROXICODONE) 15 MG immediate release tablet Take 1 tablet (15 mg total) by mouth every 8 (eight) hours as needed for pain. Patient taking differently: Take 10 mg by mouth 3 (three) times daily.  12/25/17   Mayo, Allyn Kenner, MD  senna-docusate (SENOKOT-S) 8.6-50 MG tablet Take 1 tablet by mouth at bedtime as needed for mild constipation. 12/25/17   Mayo, Allyn Kenner, MD  testosterone cypionate (DEPOTESTOTERONE CYPIONATE) 100 MG/ML injection Inject into the muscle every 14 (fourteen) days. For IM use only    [provider]  thiamine 100 MG tablet Take 1 tablet (100 mg total) by mouth daily. 11/02/17   Alford Highland, MD     Allergies Nsaids and Other  Family History  Problem Relation Age of Onset  . Rheum arthritis Father     Social History Social History   Tobacco Use  . Smoking status: Current Every Day Smoker    Packs/day: 0.50    Types: Cigarettes  . Smokeless tobacco: Never Used  Substance Use Topics  . Alcohol use: Yes    Comment: Pt had tequila and rum, at least a 5th or a pint a day  . Drug use: No    Review of Systems  Constitutional: Denies fevers Eyes: No visual changes.  ENT: No neck pain Cardiovascular: Denies chest pain. Respiratory: Denies shortness of breath. Gastrointestinal: Mild nausea Genitourinary: Frequent urination Musculoskeletal: "I got the shakes " Skin: Negative for rash. Neurological: Negative for headaches    ____________________________________________   PHYSICAL EXAM:  VITAL SIGNS: ED Triage Vitals  Enc Vitals Group     BP 02/03/18 1849 (!) 152/96     Pulse Rate 02/03/18 1850 72     Resp 02/03/18 1850 15     Temp 02/03/18 1857 98.2 F (36.8 C)     Temp Source 02/03/18 1857 Oral     SpO2 02/03/18 1850 100 %     Weight 02/03/18 1901 83.5 kg  (184 lb 1.4 oz)     Height 02/03/18 1901 1.803 m (5\' 11" )     Head Circumference --      Peak Flow --      Pain Score --      Pain Loc --      Pain Edu? --      Excl. in GC? --     Constitutional: Alert and oriented.  Tremulous Eyes: Conjunctivae are normal.  Head: Atraumatic. Nose: No congestion/rhinnorhea. Mouth/Throat: Mucous membranes are moist.   Neck:  Painless ROM Cardiovascular: Normal rate, regular rhythm. Grossly normal heart sounds.  Good peripheral circulation. Respiratory: Normal respiratory effort.  No retractions. Lungs CTAB. Gastrointestinal: Soft and nontender. No distention.  No CVA tenderness.  Musculoskeletal:  Warm and well perfused Neurologic:  Normal speech and language. No gross focal neurologic deficits are appreciated.  Skin:  Skin is warm,  dry and intact. No rash noted. Psychiatric: Mood and affect are normal. Speech and behavior are normal.  ____________________________________________   LABS (all labs ordered are listed, but only abnormal results are displayed)  Labs Reviewed  CBC - Abnormal; Notable for the following components:      Result Value   RDW 21.7 (*)    Platelets 469 (*)    All other components within normal limits  COMPREHENSIVE METABOLIC PANEL - Abnormal; Notable for the following components:   Glucose, Bld 138 (*)    Calcium 8.3 (*)    AST 53 (*)    All other components within normal limits  ETHANOL - Abnormal; Notable for the following components:   Alcohol, Ethyl (B) 391 (*)    All other components within normal limits  URINE DRUG SCREEN, QUALITATIVE ([facility] ONLY) - Abnormal; Notable for the following components:   Barbiturates, Ur Screen POSITIVE (*)    Benzodiazepine, Ur Scrn POSITIVE (*)    All other components within normal limits   ____________________________________________  EKG  ED ECG REPORT I, Jene Every, the attending physician, personally viewed and interpreted this ECG.  Date: 02/03/2018  Rhythm:  Atrial fibrillation QRS Axis: normal Intervals: normal ST/T Wave abnormalities: normal Narrative Interpretation: no evidence of acute ischemia  ____________________________________________  RADIOLOGY  None ____________________________________________   PROCEDURES  Procedure(s) performed: No  Procedures   Critical Care performed: No ____________________________________________   INITIAL IMPRESSION / ASSESSMENT AND PLAN / ED COURSE  Pertinent labs & imaging results that were available during my care of the patient were reviewed by me and considered in my medical decision making (see chart for details).  Patient presents with likely alcohol intoxication, pending ethanol, at high risk for withdrawal, will give Ativan IV, IV fluids, check labs and placed in the cardiac monitor  CIWA protocol ordered. Will consult TTS    ____________________________________________   FINAL CLINICAL IMPRESSION(S) / ED DIAGNOSES  Final diagnoses:  Alcoholic intoxication without complication (HCC)        Note:  This document was prepared using Dragon voice recognition software and may include unintentional dictation errors.        Jene Every, MD 02/03/18 2253

## 2018-02-03 NOTE — ED Triage Notes (Signed)
Pt arrives from home via [facility]. Pt resides with parents and after reportedly drinking 43 airplane bottles of bicardi today he become aggressive at which time his parents called EMS. Pt arrives airway patent, VSS, covered in feces from head to toe. Pt cleaned up, clothing placed in belongings bag along with wallet, all of which are covered in stool. Protocol initiated.

## 2018-02-04 ENCOUNTER — Inpatient Hospital Stay
Admission: EM | Admit: 2018-02-04 | Discharge: 2018-02-07 | DRG: 897 | Disposition: A | Discharge status: Home / Self Care | Payer: Medicaid Other | Attending: Internal Medicine | Admitting: Internal Medicine

## 2018-02-04 ENCOUNTER — Other Ambulatory Visit: Payer: Self-pay

## 2018-02-04 DIAGNOSIS — K921 Melena: Secondary | ICD-10-CM

## 2018-02-04 DIAGNOSIS — G7000 Myasthenia gravis without (acute) exacerbation: Secondary | ICD-10-CM | POA: Diagnosis present

## 2018-02-04 DIAGNOSIS — K922 Gastrointestinal hemorrhage, unspecified: Secondary | ICD-10-CM | POA: Diagnosis present

## 2018-02-04 DIAGNOSIS — K267 Chronic duodenal ulcer without hemorrhage or perforation: Secondary | ICD-10-CM | POA: Diagnosis present

## 2018-02-04 DIAGNOSIS — F10229 Alcohol dependence with intoxication, unspecified: Secondary | ICD-10-CM | POA: Diagnosis present

## 2018-02-04 DIAGNOSIS — F10230 Alcohol dependence with withdrawal, uncomplicated: Secondary | ICD-10-CM | POA: Diagnosis present

## 2018-02-04 DIAGNOSIS — Z886 Allergy status to analgesic agent status: Secondary | ICD-10-CM | POA: Diagnosis not present

## 2018-02-04 DIAGNOSIS — I5042 Chronic combined systolic (congestive) and diastolic (congestive) heart failure: Secondary | ICD-10-CM | POA: Diagnosis present

## 2018-02-04 DIAGNOSIS — I482 Chronic atrial fibrillation, unspecified: Secondary | ICD-10-CM | POA: Diagnosis present

## 2018-02-04 DIAGNOSIS — I110 Hypertensive heart disease with heart failure: Secondary | ICD-10-CM | POA: Diagnosis present

## 2018-02-04 DIAGNOSIS — K2900 Acute gastritis without bleeding: Secondary | ICD-10-CM | POA: Diagnosis present

## 2018-02-04 DIAGNOSIS — E440 Moderate protein-calorie malnutrition: Secondary | ICD-10-CM

## 2018-02-04 DIAGNOSIS — F10930 Alcohol use, unspecified with withdrawal, uncomplicated: Secondary | ICD-10-CM

## 2018-02-04 DIAGNOSIS — K257 Chronic gastric ulcer without hemorrhage or perforation: Secondary | ICD-10-CM | POA: Diagnosis present

## 2018-02-04 LAB — COMPREHENSIVE METABOLIC PANEL
ALT: 35 U/L (ref 0–44)
ANION GAP: 15 (ref 5–15)
AST: 59 U/L — ABNORMAL HIGH (ref 15–41)
Albumin: 3.7 g/dL (ref 3.5–5.0)
Alkaline Phosphatase: 85 U/L (ref 38–126)
BUN: 9 mg/dL (ref 6–20)
CHLORIDE: 101 mmol/L (ref 98–111)
CO2: 22 mmol/L (ref 22–32)
Calcium: 8 mg/dL — ABNORMAL LOW (ref 8.9–10.3)
Creatinine, Ser: 0.81 mg/dL (ref 0.61–1.24)
GFR calc Af Amer: >60 mL/min (ref >60)
GFR calc non Af Amer: >60 mL/min (ref >60)
Glucose, Bld: 161 mg/dL — ABNORMAL HIGH (ref 70–99)
POTASSIUM: 3.4 mmol/L — AB (ref 3.5–5.1)
Sodium: 138 mmol/L (ref 135–145)
Total Bilirubin: 0.6 mg/dL (ref 0.3–1.2)
Total Protein: 6.6 g/dL (ref 6.5–8.1)

## 2018-02-04 LAB — CBC WITH DIFFERENTIAL/PLATELET
BASOS ABS: 0.2 10*3/uL — AB (ref 0–0.1)
BASOS PCT: 1 %
Eosinophils Absolute: 0 10*3/uL (ref 0–0.7)
Eosinophils Relative: 0 %
HCT: 34.3 % — ABNORMAL LOW (ref 40.0–52.0)
HEMOGLOBIN: 11.4 g/dL — AB (ref 13.0–18.0)
Lymphocytes Relative: 11 %
Lymphs Abs: 1.3 10*3/uL (ref 1.0–3.6)
MCH: 27.4 pg (ref 26.0–34.0)
MCHC: 33.2 g/dL (ref 32.0–36.0)
MCV: 82.6 fL (ref 80.0–100.0)
MONO ABS: 1.8 10*3/uL — AB (ref 0.2–1.0)
Monocytes Relative: 15 %
NEUTROS ABS: 8.6 10*3/uL — AB (ref 1.4–6.5)
NEUTROS PCT: 73 %
Platelets: 400 10*3/uL (ref 150–440)
RBC: 4.15 MIL/uL — ABNORMAL LOW (ref 4.40–5.90)
RDW: 21.5 % — ABNORMAL HIGH (ref 11.5–14.5)
WBC: 11.9 10*3/uL — ABNORMAL HIGH (ref 3.8–10.6)

## 2018-02-04 LAB — TYPE AND SCREEN
ABO/RH(D): O POS
ANTIBODY SCREEN: NEGATIVE

## 2018-02-04 LAB — GLUCOSE, CAPILLARY: GLUCOSE-CAPILLARY: 122 mg/dL — AB (ref 70–99)

## 2018-02-04 LAB — HEMOGLOBIN AND HEMATOCRIT, BLOOD
HEMATOCRIT: 33.4 % — AB (ref 40.0–52.0)
HEMOGLOBIN: 11.2 g/dL — AB (ref 13.0–18.0)

## 2018-02-04 LAB — PROTIME-INR
INR: 0.97
Prothrombin Time: 12.8 seconds (ref 11.4–15.2)

## 2018-02-04 LAB — APTT

## 2018-02-04 MED ORDER — THIAMINE HCL 100 MG/ML IJ SOLN: 100.0000 mg | Freq: Every day | INTRAMUSCULAR | Status: DC

## 2018-02-04 MED ORDER — LORAZEPAM 1 MG PO TABS
1.0000 mg | ORAL_TABLET | Freq: Four times a day (QID) | ORAL | Status: DC | PRN
  Administered 2018-02-05 – 2018-02-07 (×7): 1 mg via ORAL
  Filled 2018-02-04 (×7): qty 1

## 2018-02-04 MED ORDER — ONDANSETRON HCL 4 MG PO TABS: 4.0000 mg | ORAL_TABLET | Freq: Four times a day (QID) | ORAL | Status: DC | PRN

## 2018-02-04 MED ORDER — LORAZEPAM 2 MG PO TABS
2.0000 mg | ORAL_TABLET | Freq: Once | ORAL | Status: AC
  Administered 2018-02-04: 2 mg via ORAL
  Filled 2018-02-04: qty 1

## 2018-02-04 MED ORDER — THIAMINE HCL 100 MG/ML IJ SOLN
Freq: Once | INTRAVENOUS | Status: AC
  Administered 2018-02-04: 03:00:00 via INTRAVENOUS
  Filled 2018-02-04: qty 1000

## 2018-02-04 MED ORDER — ADULT MULTIVITAMIN W/MINERALS CH
1.0000 | ORAL_TABLET | Freq: Every day | ORAL | Status: DC
  Administered 2018-02-04 – 2018-02-06 (×3): 1 via ORAL
  Filled 2018-02-04 (×3): qty 1

## 2018-02-04 MED ORDER — POTASSIUM CHLORIDE CRYS ER 20 MEQ PO TBCR
40.0000 meq | EXTENDED_RELEASE_TABLET | Freq: Once | ORAL | Status: AC
  Administered 2018-02-04: 40 meq via ORAL
  Filled 2018-02-04: qty 2

## 2018-02-04 MED ORDER — SODIUM CHLORIDE 0.9 % IV SOLN
8.0000 mg/h | INTRAVENOUS | Status: DC
  Administered 2018-02-04 – 2018-02-05 (×2): 8 mg/h via INTRAVENOUS
  Filled 2018-02-04 (×2): qty 80

## 2018-02-04 MED ORDER — HALOPERIDOL LACTATE 5 MG/ML IJ SOLN
2.5000 mg | Freq: Once | INTRAMUSCULAR | Status: AC
  Administered 2018-02-04: 2.5 mg via INTRAVENOUS
  Filled 2018-02-04: qty 1

## 2018-02-04 MED ORDER — LORAZEPAM 2 MG/ML IJ SOLN
2.0000 mg | Freq: Once | INTRAMUSCULAR | Status: AC
  Administered 2018-02-04: 2 mg via INTRAVENOUS
  Filled 2018-02-04: qty 1

## 2018-02-04 MED ORDER — VITAMIN B-1 100 MG PO TABS
100.0000 mg | ORAL_TABLET | Freq: Every day | ORAL | Status: DC
  Administered 2018-02-04 – 2018-02-07 (×4): 100 mg via ORAL
  Filled 2018-02-04 (×4): qty 1

## 2018-02-04 MED ORDER — HYDRALAZINE HCL 20 MG/ML IJ SOLN: 5.0000 mg | INTRAMUSCULAR | Status: DC | PRN

## 2018-02-04 MED ORDER — LORAZEPAM 2 MG/ML IJ SOLN: 1.0000 mg | Freq: Four times a day (QID) | INTRAMUSCULAR | Status: DC | PRN

## 2018-02-04 MED ORDER — INSULIN ASPART 100 UNIT/ML ~~LOC~~ SOLN
0.0000 [IU] | Freq: Three times a day (TID) | SUBCUTANEOUS | Status: DC
  Administered 2018-02-07: 1 [IU] via SUBCUTANEOUS
  Filled 2018-02-04: qty 1

## 2018-02-04 MED ORDER — SODIUM CHLORIDE 0.9 % IV SOLN
INTRAVENOUS | Status: DC
  Administered 2018-02-04: 18:00:00 via INTRAVENOUS

## 2018-02-04 MED ORDER — AMIODARONE HCL 200 MG PO TABS
200.0000 mg | ORAL_TABLET | Freq: Every day | ORAL | Status: DC
  Administered 2018-02-04 – 2018-02-07 (×4): 200 mg via ORAL
  Filled 2018-02-04 (×5): qty 1

## 2018-02-04 MED ORDER — LORAZEPAM 2 MG/ML IJ SOLN
0.0000 mg | Freq: Four times a day (QID) | INTRAMUSCULAR | Status: DC
  Administered 2018-02-04: 2 mg via INTRAVENOUS
  Filled 2018-02-04: qty 1

## 2018-02-04 MED ORDER — DILTIAZEM HCL ER COATED BEADS 180 MG PO CP24
180.0000 mg | ORAL_CAPSULE | Freq: Every day | ORAL | Status: DC
  Administered 2018-02-04 – 2018-02-07 (×4): 180 mg via ORAL
  Filled 2018-02-04 (×5): qty 1

## 2018-02-04 MED ORDER — INSULIN ASPART 100 UNIT/ML ~~LOC~~ SOLN: 0.0000 [IU] | Freq: Every day | SUBCUTANEOUS | Status: DC

## 2018-02-04 MED ORDER — ONDANSETRON HCL 4 MG/2ML IJ SOLN
4.0000 mg | Freq: Four times a day (QID) | INTRAMUSCULAR | Status: DC | PRN
  Administered 2018-02-04: 4 mg via INTRAVENOUS
  Filled 2018-02-04: qty 2

## 2018-02-04 MED ORDER — LORAZEPAM 2 MG/ML IJ SOLN
2.0000 mg | INTRAMUSCULAR | Status: DC | PRN
  Administered 2018-02-04 – 2018-02-06 (×7): 2 mg via INTRAVENOUS
  Filled 2018-02-04 (×7): qty 1

## 2018-02-04 MED ORDER — FOLIC ACID 1 MG PO TABS
1.0000 mg | ORAL_TABLET | Freq: Every day | ORAL | Status: DC
  Administered 2018-02-04 – 2018-02-07 (×4): 1 mg via ORAL
  Filled 2018-02-04 (×4): qty 1

## 2018-02-04 MED ORDER — MORPHINE SULFATE (PF) 2 MG/ML IV SOLN
2.0000 mg | Freq: Once | INTRAVENOUS | Status: AC
  Administered 2018-02-04: 2 mg via INTRAVENOUS
  Filled 2018-02-04: qty 1

## 2018-02-04 MED ORDER — BUSPIRONE HCL 15 MG PO TABS
15.0000 mg | ORAL_TABLET | Freq: Two times a day (BID) | ORAL | Status: DC
  Administered 2018-02-04 – 2018-02-07 (×6): 15 mg via ORAL
  Filled 2018-02-04 (×7): qty 1

## 2018-02-04 MED ORDER — GABAPENTIN 300 MG PO CAPS
600.0000 mg | ORAL_CAPSULE | Freq: Three times a day (TID) | ORAL | Status: DC
  Administered 2018-02-04 – 2018-02-07 (×9): 600 mg via ORAL
  Filled 2018-02-04 (×9): qty 2

## 2018-02-04 MED ORDER — CITALOPRAM HYDROBROMIDE 20 MG PO TABS
20.0000 mg | ORAL_TABLET | Freq: Every day | ORAL | Status: DC
  Administered 2018-02-04 – 2018-02-07 (×4): 20 mg via ORAL
  Filled 2018-02-04 (×4): qty 1

## 2018-02-04 MED ORDER — LORAZEPAM 2 MG/ML IJ SOLN
INTRAMUSCULAR | Status: AC
  Filled 2018-02-04: qty 1

## 2018-02-04 MED ORDER — LORAZEPAM 2 MG/ML IJ SOLN
2.0000 mg | Freq: Once | INTRAMUSCULAR | Status: AC
  Administered 2018-02-04: 2 mg via INTRAVENOUS

## 2018-02-04 MED ORDER — METOPROLOL SUCCINATE ER 50 MG PO TB24
50.0000 mg | ORAL_TABLET | Freq: Every day | ORAL | Status: DC
  Administered 2018-02-04 – 2018-02-07 (×4): 50 mg via ORAL
  Filled 2018-02-04 (×4): qty 1

## 2018-02-04 MED ORDER — SODIUM CHLORIDE 0.9 % IV SOLN
80.0000 mg | Freq: Once | INTRAVENOUS | Status: AC
  Administered 2018-02-04: 80 mg via INTRAVENOUS
  Filled 2018-02-04: qty 80

## 2018-02-04 NOTE — ED Notes (Addendum)
Pt requesting ativan for shaking at this time.

## 2018-02-04 NOTE — ED Notes (Signed)
Pt up to toilet has bloody residue in brief, loose bloody BM passed at this time. This is second bloody BM passed today

## 2018-02-04 NOTE — ED Notes (Signed)
Pt had an episode of bowel incontinents. Pt taken to toilet to be cleaned up at which time he passed a moderate amount of bright red blood with stool in toilet. No blood noted with previous BS's. Pt denies any hx of hemorrhoids

## 2018-02-04 NOTE — ED Notes (Signed)
Pt requested to get up to go to bathroom for bowel movement. When pt brief was removed there was bright red blood in brief. Which pt thought was bowel. No bowel noted. When pt sat on towel to have bowel movement only blood was presented. Bright red in color.  Pt requested more medicine.  Pt requested food. Food will be delivered when ED receives more trays  Lm edt

## 2018-02-04 NOTE — ED Notes (Signed)
Peripheral IV discontinued. Catheter intact. No signs of infiltration or redness. Gauze applied to IV site.   Discharge instructions reviewed with patient. Questions fielded by this RN. Patient verbalizes understanding of instructions. Patient discharged home in stable condition per sung. No acute distress noted at time of discharge.    

## 2018-02-04 NOTE — ED Notes (Signed)
Pt taken to lobby in wheel chair, still shaky from alcohol withdraw. Pt attempting to call taxi service for transport, unable to reach first attempt. Sitting in front of officer desk at phone to attempt to call again.

## 2018-02-04 NOTE — Discharge Instructions (Signed)
Drink alcohol only in moderation.  Return to the ER for worsening symptoms, persistent vomiting, difficulty breathing or other concerns. 

## 2018-02-04 NOTE — Consult Note (Signed)
[name], MD [address], [address], [place], [place], [address] [address], [address], [place], [place], [address] Phone: [phone]  Fax: [phone]  Consultation  Referring Provider:     Dr. Nancy Marus Primary Care Physician:  [name], MD Reason for Consultation:     Hematochezia  Date of Admission:  02/04/2018 Date of Consultation:  02/04/2018         HPI:   Kurt Horton is a 57 y.o. male with history of alcohol abuse, chronic A. fib, not on any anticoagulation at home, presents with alcohol withdrawal.  He presents due to shaking after not having alcohol for 3 days.  Alcohol level in the ED was 391.  While in the ED patient had one red bowel movement, but is hemodynamically stable.  Hemoglobin today is 11.4 with normal INR, and hemoglobin yesterday was around 14.  BUN is normal as well.  No hematemesis.  Patient reports having red fruit punch this morning and states his bowel movement appeared to be that same food points that he had this morning.  Denies any blood clots per rectum.   Last abdominal imaging was in June 2019 and reports suspected hepatic steatosis.  CT chest in September 2019 also reported fatty infiltration of the liver.  Liver enzymes with mildly elevated AST of 59.  Patient had an EGD in September 2018 due to suspected upper GI bleed and showed 2 nonbleeding duodenal ulcers, one 6 mm another one 14 mm.  Esophagus was reported to be normal.  No varices were reported on his procedure report.  Patient had a colonoscopy in 2015, report not available, patient states 10 polyps were removed and he is past due for repeat colonoscopy.  Reports taking daily ibuprofen prior to his last EGD in September 2018.  Now takes BC powder once a week.  Past Medical History:  Diagnosis Date  . A-fib (HCC)   . Alcohol abuse   . Alcohol withdrawal (HCC) 11/12/2016  . Diabetes mellitus without complication (HCC)   . DJD (degenerative joint disease)   . Hypertension    . Myasthenia gravis (HCC)   . Renal disorder     Past Surgical History:  Procedure Laterality Date  . AMPUTATION TOE Left 12/29/2015   Procedure: AMPUTATION TOE;  Surgeon: Gwyneth Revels, DPM;  Location: [facility] ORS;  Service: Podiatry;  Laterality: Left;  . PERIPHERAL VASCULAR CATHETERIZATION N/A 12/27/2015   Procedure: Lower Extremity Angiography;  Surgeon: Annice Needy, MD;  Location: [facility] INVASIVE CV LAB;  Service: Cardiovascular;  Laterality: N/A;  . right hip surgery    . right shoulder surgery      Prior to Admission medications   Medication Sig Start Date End Date Taking? Authorizing Provider  amiodarone (PACERONE) 200 MG tablet Take 1 tablet (200 mg total) by mouth daily. 11/02/17   Alford Highland, MD  aspirin 325 MG EC tablet Take 1 tablet (325 mg total) by mouth daily. 11/02/17   Alford Highland, MD  B Complex-C (B-COMPLEX WITH VITAMIN C) tablet Take 1 tablet by mouth daily. 11/02/17   Alford Highland, MD  bisacodyl (DULCOLAX) 5 MG EC tablet Take 1 tablet (5 mg total) by mouth daily as needed for moderate constipation. Patient not taking: Reported on 02/04/2018 12/25/17   MayoAllyn Kenner, MD  busPIRone (BUSPAR) 15 MG tablet Take 15 mg by mouth 2 (two) times daily.    [provider]  citalopram (CELEXA) 20 MG tablet Take 1 tablet (20 mg total) by  mouth daily. 11/02/17   Alford Highland, MD  diltiazem (CARDIZEM CD) 180 MG 24 hr capsule Take 1 capsule (180 mg total) by mouth daily. 11/02/17 03/02/18  Alford Highland, MD  folic acid (FOLVITE) 1 MG tablet Take 1 tablet (1 mg total) by mouth daily. 12/26/17   Mayo, Allyn Kenner, MD  gabapentin (NEURONTIN) 300 MG capsule Take 2 capsules (600 mg total) by mouth 3 (three) times daily. 11/02/17   Alford Highland, MD  metFORMIN (GLUCOPHAGE) 500 MG tablet Take 1 tablet (500 mg total) by mouth daily. 11/02/17   Alford Highland, MD  metoprolol succinate (TOPROL-XL) 50 MG 24 hr tablet Take 1 tablet (50 mg total) by mouth daily. Take with or  immediately following a meal. 11/02/17   Alford Highland, MD  multivitamin (ONE-A-DAY MEN'S) TABS tablet Take 1 tablet by mouth daily. 11/02/17   Alford Highland, MD  mycophenolate (CELLCEPT) 500 MG tablet Take 1 tablet (500 mg total) by mouth 2 (two) times daily. Take 1 tablet (500MG ) by mouth every morning and 2 tablets (1000MG ) by mouth every evening Patient taking differently: Take 500-1,000 mg by mouth See admin instructions. 500 mg every morning and 1000 mg at bedtime 11/02/17   Alford Highland, MD  oxyCODONE (ROXICODONE) 15 MG immediate release tablet Take 1 tablet (15 mg total) by mouth every 8 (eight) hours as needed for pain. Patient not taking: Reported on 02/04/2018 12/25/17   MayoAllyn Kenner, MD  potassium chloride (K-DUR,KLOR-CON) 10 MEQ tablet Take 10 mEq by mouth daily.    [provider]  senna-docusate (SENOKOT-S) 8.6-50 MG tablet Take 1 tablet by mouth at bedtime as needed for mild constipation. Patient taking differently: Take 1 tablet by mouth daily.  12/25/17   Mayo, Allyn Kenner, MD  testosterone cypionate (DEPOTESTOSTERONE CYPIONATE) 200 MG/ML injection Inject 200 mg into the muscle every 14 (fourteen) days.    [provider]  thiamine 100 MG tablet Take 1 tablet (100 mg total) by mouth daily. 11/02/17   Alford Highland, MD    Family History  Problem Relation Age of Onset  . Rheum arthritis Father      Social History   Tobacco Use  . Smoking status: Current Every Day Smoker    Packs/day: 0.50    Types: Cigarettes  . Smokeless tobacco: Never Used  Substance Use Topics  . Alcohol use: Yes    Comment: Pt had tequila and rum, at least a 5th or a pint a day  . Drug use: No    Allergies as of 02/04/2018 - Review Complete 02/04/2018  Allergen Reaction Noted  . Nsaids Other (See Comments) 01/09/2017  . Other Other (See Comments) 12/26/2015    Review of Systems:    All systems reviewed and negative except where noted in HPI.   Physical Exam:  Vital  signs in last 24 hours: Vitals:   02/04/18 0903  BP: (!) 175/83  Pulse: (!) 103  Resp: (!) 24  Temp: 98.9 F (37.2 C)  SpO2: 99%     General:   Pleasant, cooperative in NAD Head:  Normocephalic and atraumatic. Eyes:   No icterus.   Conjunctiva pink. PERRLA. Ears:  Normal auditory acuity. Neck:  Supple; no masses or thyroidomegaly Lungs: Respirations even and unlabored. Lungs clear to auscultation bilaterally.   No wheezes, crackles, or rhonchi.  Abdomen:  Soft, nondistended, nontender. Normal bowel sounds. No appreciable masses or hepatomegaly.  No rebound or guarding.  Neurologic:  Alert and oriented x3;  grossly normal neurologically. Skin:  Intact without significant lesions or rashes. Cervical Nodes:  No significant cervical adenopathy. Psych:  Alert and cooperative. Normal affect.  LAB RESULTS: Recent Labs    02/03/18 1840 02/04/18 1014  WBC 5.0 11.9*  HGB 14.1 11.4*  HCT 43.4 34.3*  PLT 469* 400   BMET Recent Labs    02/03/18 1840 02/04/18 1014  NA 143 138  K 4.7 3.4*  CL 104 101  CO2 28 22  GLUCOSE 138* 161*  BUN 10 9  CREATININE 0.81 0.81  CALCIUM 8.3* 8.0*   LFT Recent Labs    02/04/18 1014  PROT 6.6  ALBUMIN 3.7  AST 59*  ALT 35  ALKPHOS 85  BILITOT 0.6   PT/INR Recent Labs    02/04/18 1014  LABPROT 12.8  INR 0.97    STUDIES: No results found.    Impression / Plan:   Kurt Horton is a 57 y.o. y/o male with alcohol abuse, presents for alcohol withdrawal, with GI consulted for red appearing bowel movement in the ER today  Patient reports that his bowel movement appeared to be the same color as this was punch that he had this morning He denies any blood clots in his stool His hemoglobin being stable is also consistent with a bowel movement possibly not being acute GI bleed However, he does have history of duodenal ulcers and is taking BC powder about once a week Signs and symptoms are not consistent with upper GI bleed given  hemodynamic stability However, given NSAID use and history of duodenal ulcers, would treat with Protonix 40 IV twice daily Continue serial CBCs and transfuse PRN  Continue to monitor closely Depending on clinical status, and given his alcohol withdrawal, will need to monitor for best timing for any GI procedures If patient continues to have red bowel movements, can start colonoscopy prep for possible EGD and colonoscopy if clinical status allows However, if no further hematochezia occurs, colonoscopy prep is best done on an outpatient setting when he is not in withdrawal, and is able to tolerate the colonoscopy prep safely without it causing any hemodynamic changes from dehydration  EGD can be considered in 1 to 2 days depending on hemodynamics given his alcohol withdrawal.  EGD would be to follow-up for his previous history of duodenal ulcers, and rule out any upper sources of GI bleed  However, if patient becomes hemodynamically unstable and has signs of active GI bleeding, can consider procedures earlier  At this point medical optimization with IV fluids, CIWA protocol, folate, thiamine, are important given that he does not have any active GI bleeding at this time We will determine appropriate timeline for endoscopic procedures depending on clinical status  Diet okay today N.p.o. past midnight  No previous history of cirrhosis or varices and no clinical evidence of cirrhosis at this time  Thank you for involving me in the care of this patient.      LOS: 0 days   Pasty Spillers, MD  02/04/2018, 11:25 AM

## 2018-02-04 NOTE — ED Provider Notes (Addendum)
[facility] Emergency Department Provider Note  ____________________________________________   I have reviewed the triage vital signs and the nursing notes. Where available I have reviewed prior notes and, if possible and indicated, outside hospital notes.    HISTORY  Chief Complaint Shaking    HPI Kurt Horton is a 57 y.o. male  with a history of EtOH abuse, was discharged this morning, went to the waiting room and since going home began to feel shaky so checked himself back in. States that if he goes home he will have withdrawal. He wants to be admitted to the hospital. No chest pain or shortness of breath, no seizure. has been excepted multiple times in the past for withdrawal to the Hospital.  Past Medical History:  Diagnosis Date  . A-fib (HCC)   . Alcohol abuse   . Alcohol withdrawal (HCC) 11/12/2016  . Diabetes mellitus without complication (HCC)   . DJD (degenerative joint disease)   . Hypertension   . Myasthenia gravis (HCC)   . Renal disorder     Patient Active Problem List   Diagnosis Date Noted  . Alcohol intoxication (HCC) 12/22/2017  . Malnutrition of moderate degree 10/04/2017  . Chronic combined systolic and diastolic CHF (congestive heart failure) (HCC) 08/13/2017  . Alcohol withdrawal delirium (HCC) 02/26/2017  . Atrial fibrillation with RVR (HCC) 02/25/2017  . Acute alcoholic intoxication with complication (HCC)   . Alcohol abuse   . Alcohol withdrawal (HCC) 11/13/2016  . Substance induced mood disorder (HCC) 11/13/2016  . SVT (supraventricular tachycardia) (HCC) 11/12/2016  . HTN (hypertension) 11/12/2016  . EtOH dependence (HCC) 11/12/2016  . Lumbar radiculopathy 08/24/2016  . Depression with anxiety 08/24/2016  . Chronic low back pain 01/04/2016  . Pressure ulcer 12/29/2015  . Osteomyelitis (HCC) 12/26/2015  . PVD (peripheral vascular disease) (HCC) 12/26/2015  . Type II diabetes mellitus with manifestations (HCC)  12/26/2015  . Myasthenia gravis (HCC) 12/26/2015    Past Surgical History:  Procedure Laterality Date  . AMPUTATION TOE Left 12/29/2015   Procedure: AMPUTATION TOE;  Surgeon: Gwyneth Revels, DPM;  Location: [facility] ORS;  Service: Podiatry;  Laterality: Left;  . PERIPHERAL VASCULAR CATHETERIZATION N/A 12/27/2015   Procedure: Lower Extremity Angiography;  Surgeon: Annice Needy, MD;  Location: [facility] INVASIVE CV LAB;  Service: Cardiovascular;  Laterality: N/A;  . right hip surgery    . right shoulder surgery      Prior to Admission medications   Medication Sig Start Date End Date Taking? Authorizing Provider  amiodarone (PACERONE) 200 MG tablet Take 1 tablet (200 mg total) by mouth daily. 11/02/17  Yes Wieting, Richard, MD  aspirin 325 MG EC tablet Take 1 tablet (325 mg total) by mouth daily. 11/02/17  Yes Wieting, Richard, MD  B Complex-C (B-COMPLEX WITH VITAMIN C) tablet Take 1 tablet by mouth daily. 11/02/17  Yes Wieting, Richard, MD  citalopram (CELEXA) 20 MG tablet Take 1 tablet (20 mg total) by mouth daily. 11/02/17  Yes Wieting, Richard, MD  diltiazem (CARDIZEM CD) 180 MG 24 hr capsule Take 1 capsule (180 mg total) by mouth daily. 11/02/17 03/02/18 Yes Wieting, Richard, MD  bisacodyl (DULCOLAX) 5 MG EC tablet Take 1 tablet (5 mg total) by mouth daily as needed for moderate constipation. Patient not taking: Reported on 02/04/2018 12/25/17   MayoAllyn Kenner, MD  folic acid (FOLVITE) 1 MG tablet Take 1 tablet (1 mg total) by mouth daily. 12/26/17   Mayo, Allyn Kenner, MD  gabapentin (NEURONTIN) 300 MG  capsule Take 2 capsules (600 mg total) by mouth 3 (three) times daily. 11/02/17   Alford Highland, MD  metFORMIN (GLUCOPHAGE) 500 MG tablet Take 1 tablet (500 mg total) by mouth daily. 11/02/17   Alford Highland, MD  metoprolol succinate (TOPROL-XL) 50 MG 24 hr tablet Take 1 tablet (50 mg total) by mouth daily. Take with or immediately following a meal. 11/02/17   Alford Highland, MD  multivitamin (ONE-A-DAY  MEN'S) TABS tablet Take 1 tablet by mouth daily. 11/02/17   Alford Highland, MD  mycophenolate (CELLCEPT) 500 MG tablet Take 1 tablet (500 mg total) by mouth 2 (two) times daily. Take 1 tablet (500MG ) by mouth every morning and 2 tablets (1000MG ) by mouth every evening 11/02/17   Alford Highland, MD  oxyCODONE (ROXICODONE) 15 MG immediate release tablet Take 1 tablet (15 mg total) by mouth every 8 (eight) hours as needed for pain. Patient taking differently: Take 10 mg by mouth 3 (three) times daily.  12/25/17   Mayo, Allyn Kenner, MD  senna-docusate (SENOKOT-S) 8.6-50 MG tablet Take 1 tablet by mouth at bedtime as needed for mild constipation. 12/25/17   Mayo, Allyn Kenner, MD  testosterone cypionate (DEPOTESTOTERONE CYPIONATE) 100 MG/ML injection Inject into the muscle every 14 (fourteen) days. For IM use only    [provider]  thiamine 100 MG tablet Take 1 tablet (100 mg total) by mouth daily. 11/02/17   Alford Highland, MD    Allergies Nsaids and Other  Family History  Problem Relation Age of Onset  . Rheum arthritis Father     Social History Social History   Tobacco Use  . Smoking status: Current Every Day Smoker    Packs/day: 0.50    Types: Cigarettes  . Smokeless tobacco: Never Used  Substance Use Topics  . Alcohol use: Yes    Comment: Pt had tequila and rum, at least a 5th or a pint a day  . Drug use: No    Review of Systems Constitutional: No fever/chills Eyes: No visual changes. ENT: No sore throat. No stiff neck no neck pain Cardiovascular: Denies chest pain. Respiratory: Denies shortness of breath. Gastrointestinal:   no vomiting.  No diarrhea.  No constipation. Genitourinary: Negative for dysuria. Musculoskeletal: Negative lower extremity swelling Skin: Negative for rash. Neurological: Negative for severe headaches, focal weakness or numbness.   ____________________________________________   PHYSICAL EXAM:  VITAL SIGNS: ED Triage Vitals [02/04/18  0903]  Enc Vitals Group     BP (!) 175/83     Pulse Rate (!) 103     Resp (!) 24     Temp 98.9 F (37.2 C)     Temp src      SpO2 99 %     Weight      Height      Head Circumference      Peak Flow      Pain Score      Pain Loc      Pain Edu?      Excl. in GC?     Constitutional: Alert and oriented. poorly kempt nontoxic, tremulous Eyes: Conjunctivae are normal Head: Atraumatic HEENT: No congestion/rhinnorhea. Mucous membranes are moist.  Oropharynx non-erythematous Neck:   Nontender with no meningismus, no masses, no stridor Cardiovascular: mild tachycardia regular rhythm. Grossly normal heart sounds.  Good peripheral circulation. Respiratory: Normal respiratory effort.  No retractions. Lungs CTAB. Abdominal: Soft and nontender. No distention. No guarding no rebound Back:  There is no focal tenderness or step off.  there is no midline tenderness there are no lesions noted. there is no CVA tenderness Musculoskeletal: No lower extremity tenderness, no upper extremity tenderness. No joint effusions, no DVT signs strong distal pulses no edema Neurologic:  Normal speech and language. No gross focal neurologic deficits are appreciated. patient is tremulous Skin:  Skin is warm, dry and intact. No rash noted. Psychiatric: Mood and affect are normal. Speech and behavior are normal.  ____________________________________________   LABS (all labs ordered are listed, but only abnormal results are displayed)  Labs Reviewed - No data to display  Pertinent labs  results that were available during my care of the patient were reviewed by me and considered in my medical decision making (see chart for details). ____________________________________________  EKG  I personally interpreted any EKGs ordered by me or triage  ____________________________________________  RADIOLOGY  Pertinent labs & imaging results that were available during my care of the patient were reviewed by me and  considered in my medical decision making (see chart for details). If possible, patient and/or family made aware of any abnormal findings.  No results found. ____________________________________________    PROCEDURES  Procedure(s) performed: None  Procedures  Critical Care performed: None  ____________________________________________   INITIAL IMPRESSION / ASSESSMENT AND PLAN / ED COURSE  Pertinent labs & imaging results that were available during my care of the patient were reviewed by me and considered in my medical decision making (see chart for details).  patient was medically cleared prior to my arrival this morning, went out to the waiting room but then checked back in, stating that he felt like he is could have a seizure. Patient has some degree of tremor, it does seem to be somewhat intentional spray difficult to tease out but even at rest there is a little one at this time. Patient does apparently seemed to exaggerate his symptoms as for example last night when he said he was feeling shaky with alcohol 391. However, has not been since yesterday's any alcohol and this raises are concerned. He is not currently go home, he will need to be admitted to the hospital. My concern if we try to discharge and again this he'll go to the waiting room and have a seizure. I have talked to the hospitalist and they agree with management plan will admit.Marland Kitchen  ----------------------------------------- 3:19 PM on 02/04/2018 ----------------------------------------- Pt did have a bm with red blood in it. I sent h/h and t& screen. hopitalists aware. Has been admitted to their service since my last note but is boarding here. Had another bm with blood. Vs otw stable. I did make hospitalist, Dr. Nancy Marus, aware. She has consulted GI. He is monitored here.  Signed out at the end of my shift but he is under the continuing care of hospitalist.     ____________________________________________   FINAL CLINICAL  IMPRESSION(S) / ED DIAGNOSES  Final diagnoses:  Alcohol withdrawal syndrome without complication (HCC)      This chart was dictated using voice recognition software.  Despite best efforts to proofread,  errors can occur which can change meaning.      Jeanmarie Plant, MD 02/04/18 1610    Jeanmarie Plant, MD 02/04/18 [phone]

## 2018-02-04 NOTE — ED Notes (Signed)
Pt to toilet in room with episode of loose stool

## 2018-02-04 NOTE — ED Provider Notes (Signed)
-----------------------------------------   3:03 AM on 02/04/2018 -----------------------------------------  Patient keeps asking for food and drink and vomits when he quickly ingests the above.  We will keep him n.p.o. for now.  Continues to stool himself.  Staff to clean him up again.  Placed on CIWA protocol.  ----------------------------------------- 6:44 AM on 02/04/2018 -----------------------------------------  Patient ate ice chips without emesis.  Calling father for a ride.  Overall feels better.  Strict return precautions given.  Patient verbalizes understanding and agrees with plan of care.   Irean Hong, MD 02/04/18 [phone]

## 2018-02-04 NOTE — ED Notes (Signed)
Pt given sandwich tray 

## 2018-02-04 NOTE — ED Notes (Signed)
Pt diaper changed, cleaned of vomit and diarrhea, requesting ginger ale - apprised of NPO status

## 2018-02-04 NOTE — H&P (Addendum)
[facility] [place] [facility]   PATIENT NAME: Kurt Horton    MR#:  [id number]  DATE OF BIRTH:  11-02-1960  DATE OF ADMISSION:  02/04/2018  PRIMARY CARE PHYSICIAN: [name], MD   REQUESTING/REFERRING PHYSICIAN: Ileana Roup, MD  CHIEF COMPLAINT:   Chief Complaint  Patient presents with  . Shaking    HISTORY OF PRESENT ILLNESS:  Kurt Horton  is a 57 y.o. male with a known history of alcohol abuse, chronic atrial fibrillation, T2DM, HTN, and myasthenia gravis who presented to the ED with alcohol intoxication and withdrawal. He came into the ED last night. He was discharged, but became shaky while out in the waiting room, so he checked himself back into the ED. No chest pain. No seizure-like activity.   In the ED, initial EtOH was 391. UDS was positive for barbiturates and benzos. He did have a large bloody bowel movement while in the ED. He denies any melena or bloody stools. He states "he just drank a lot of fruit punch", although he has not received any fruit punch while in the ED. He denies any abdominal pain. Due to concern for alcohol withdrawal and GI bleed, hospitalists were called for admission.  PAST MEDICAL HISTORY:   Past Medical History:  Diagnosis Date  . A-fib (HCC)   . Alcohol abuse   . Alcohol withdrawal (HCC) 11/12/2016  . Diabetes mellitus without complication (HCC)   . DJD (degenerative joint disease)   . Hypertension   . Myasthenia gravis (HCC)   . Renal disorder     PAST SURGICAL HISTORY:   Past Surgical History:  Procedure Laterality Date  . AMPUTATION TOE Left 12/29/2015   Procedure: AMPUTATION TOE;  Surgeon: Gwyneth Revels, DPM;  Location: [facility] ORS;  Service: Podiatry;  Laterality: Left;  . PERIPHERAL VASCULAR CATHETERIZATION N/A 12/27/2015   Procedure: Lower Extremity Angiography;  Surgeon: Annice Needy, MD;  Location: [facility] INVASIVE CV LAB;  Service: Cardiovascular;  Laterality: N/A;  . right hip surgery    . right  shoulder surgery      SOCIAL HISTORY:   Social History   Tobacco Use  . Smoking status: Current Every Day Smoker    Packs/day: 0.50    Types: Cigarettes  . Smokeless tobacco: Never Used  Substance Use Topics  . Alcohol use: Yes    Comment: Pt had tequila and rum, at least a 5th or a pint a day    FAMILY HISTORY:   Family History  Problem Relation Age of Onset  . Rheum arthritis Father     DRUG ALLERGIES:   Allergies  Allergen Reactions  . Nsaids Other (See Comments)    Duodenal ulcers, GI bleeding  . Other Other (See Comments)    Patient states he can't take any "mycin" medications because myasthenia gravis.    REVIEW OF SYSTEMS:   Review of Systems  Constitutional: Negative for chills and fever.  HENT: Negative for congestion and sore throat.   Eyes: Negative for blurred vision and double vision.  Respiratory: Negative for cough and shortness of breath.   Cardiovascular: Negative for chest pain, palpitations and leg swelling.  Gastrointestinal: Positive for blood in stool and nausea. Negative for abdominal pain, diarrhea and vomiting.  Genitourinary: Negative for dysuria and frequency.  Musculoskeletal: Positive for back pain. Negative for neck pain.  Neurological: Positive for tremors. Negative for dizziness and headaches.  Psychiatric/Behavioral: Negative for depression. The patient is not nervous/anxious.     MEDICATIONS AT  HOME:   Prior to Admission medications   Medication Sig Start Date End Date Taking? Authorizing Provider  amiodarone (PACERONE) 200 MG tablet Take 1 tablet (200 mg total) by mouth daily. 11/02/17   Alford Highland, MD  aspirin 325 MG EC tablet Take 1 tablet (325 mg total) by mouth daily. 11/02/17   Alford Highland, MD  B Complex-C (B-COMPLEX WITH VITAMIN C) tablet Take 1 tablet by mouth daily. 11/02/17   Alford Highland, MD  bisacodyl (DULCOLAX) 5 MG EC tablet Take 1 tablet (5 mg total) by mouth daily as needed for moderate  constipation. Patient not taking: Reported on 02/04/2018 12/25/17   MayoAllyn Kenner, MD  busPIRone (BUSPAR) 15 MG tablet Take 15 mg by mouth 2 (two) times daily.    [provider]  citalopram (CELEXA) 20 MG tablet Take 1 tablet (20 mg total) by mouth daily. 11/02/17   Alford Highland, MD  diltiazem (CARDIZEM CD) 180 MG 24 hr capsule Take 1 capsule (180 mg total) by mouth daily. 11/02/17 03/02/18  Alford Highland, MD  folic acid (FOLVITE) 1 MG tablet Take 1 tablet (1 mg total) by mouth daily. 12/26/17   Anai Lipson, Allyn Kenner, MD  gabapentin (NEURONTIN) 300 MG capsule Take 2 capsules (600 mg total) by mouth 3 (three) times daily. 11/02/17   Alford Highland, MD  metFORMIN (GLUCOPHAGE) 500 MG tablet Take 1 tablet (500 mg total) by mouth daily. 11/02/17   Alford Highland, MD  metoprolol succinate (TOPROL-XL) 50 MG 24 hr tablet Take 1 tablet (50 mg total) by mouth daily. Take with or immediately following a meal. 11/02/17   Alford Highland, MD  multivitamin (ONE-A-DAY MEN'S) TABS tablet Take 1 tablet by mouth daily. 11/02/17   Alford Highland, MD  mycophenolate (CELLCEPT) 500 MG tablet Take 1 tablet (500 mg total) by mouth 2 (two) times daily. Take 1 tablet (500MG ) by mouth every morning and 2 tablets (1000MG ) by mouth every evening Patient taking differently: Take 500-1,000 mg by mouth See admin instructions. 500 mg every morning and 1000 mg at bedtime 11/02/17   Alford Highland, MD  oxyCODONE (ROXICODONE) 15 MG immediate release tablet Take 1 tablet (15 mg total) by mouth every 8 (eight) hours as needed for pain. Patient not taking: Reported on 02/04/2018 12/25/17   MayoAllyn Kenner, MD  potassium chloride (K-DUR,KLOR-CON) 10 MEQ tablet Take 10 mEq by mouth daily.    [provider]  senna-docusate (SENOKOT-S) 8.6-50 MG tablet Take 1 tablet by mouth at bedtime as needed for mild constipation. Patient taking differently: Take 1 tablet by mouth daily.  12/25/17   Joseangel Nettleton, Allyn Kenner, MD  testosterone  cypionate (DEPOTESTOSTERONE CYPIONATE) 200 MG/ML injection Inject 200 mg into the muscle every 14 (fourteen) days.    [provider]  thiamine 100 MG tablet Take 1 tablet (100 mg total) by mouth daily. 11/02/17   Alford Highland, MD      VITAL SIGNS:  Blood pressure (!) 175/83, pulse (!) 103, temperature 98.9 F (37.2 C), resp. rate (!) 24, SpO2 99 %.  PHYSICAL EXAMINATION:  Physical Exam  GENERAL:  57 y.o.-year-old patient lying in the bed with no acute distress. Appears mildly diaphoretic. EYES: Pupils equal, round, reactive to light and accommodation. No scleral icterus. Extraocular muscles intact.  HEENT: Head atraumatic, normocephalic. Oropharynx and nasopharynx clear. Moist mucous membranes. NECK:  Supple, no jugular venous distention. No thyroid enlargement, no tenderness.  LUNGS: Normal breath sounds bilaterally, no wheezing, rales,rhonchi or crepitation. No use of accessory muscles of  respiration.  CARDIOVASCULAR: Tachycardic, irregularly irregular rate, S1, S2 normal. No murmurs, rubs, or gallops.  ABDOMEN: Soft, nontender, +fluid wave. Bowel sounds present. No organomegaly or mass.  EXTREMITIES: No pedal edema, cyanosis, or clubbing.  NEUROLOGIC: Cranial nerves II through XII are intact. Muscle strength 5/5 in all extremities. Sensation intact. Gait not checked.  PSYCHIATRIC: The patient is alert and oriented x 3.  SKIN: No obvious rash, lesion, or ulcer.   LABORATORY PANEL:   CBC Recent Labs  Lab 02/03/18 1840  WBC 5.0  HGB 14.1  HCT 43.4  PLT 469*   ------------------------------------------------------------------------------------------------------------------  Chemistries  Recent Labs  Lab 02/03/18 1840  NA 143  K 4.7  CL 104  CO2 28  GLUCOSE 138*  BUN 10  CREATININE 0.81  CALCIUM 8.3*  AST 53*  ALT 39  ALKPHOS 94  BILITOT 0.4    ------------------------------------------------------------------------------------------------------------------  Cardiac Enzymes No results for input(s): TROPONINI in the last 168 hours. ------------------------------------------------------------------------------------------------------------------  RADIOLOGY:  No results found.    IMPRESSION AND PLAN:   GI bleed- had 1 large bloody bowel movement in the ED. Initial Hgb was 14.1. Last endoscopy 01/2017 with two non-bleeding duodenal ulcers. Last colonoscopy was in 2015, but unable to see results. No abdominal pain, so may be diverticular bleed. - start IV PPI - recheck CBC now - type and screen and INR - GI consult - hold anticoagulation  Alcohol withdrawal with a history of alcohol abuse- last drink 9/29 in the evening - CIWA protocol  Chronic atrial fibrillation- HRs mildly elevated in the low 100s. - hold aspirin with GI bleed - continue amiodarone, diltiazem, and metoprolol  Chronic combined systolic and diastolic heart failure- last ECHO with EF 40% and G1DD. Does not appear volume overloaded. - monitor - hold on fluids  Hypertension- BPs elevated in the ED - continue metoprolol - hydralazine prn  Type 2 diabetes- on metformin at home - sensitive SSI  Depression- stable - continue home buspar and celexa  Myasthenia gravis- stable, no signs of acute exacerbation - continue cellcept 500mg  in the morning and 1,000mg  in the evening  All the records are reviewed and case discussed with ED provider. Management plans discussed with the patient, family and they are in agreement.  CODE STATUS: Full  TOTAL TIME TAKING CARE OF THIS PATIENT: 45 minutes.    Jinny Blossom Shekia Kuper M.D on 02/04/2018 at 10:01 AM  Between 7am to 6pm - Pager - [phone]  After 6pm go to [email or web address] - Social research officer, government  [facility] [place]  Office  [phone]  CC: Primary care physician; [name],  MD   Note: This dictation was prepared with Dragon dictation along with smaller phrase technology. Any transcriptional errors that result from this process are unintentional.

## 2018-02-04 NOTE — ED Notes (Signed)
Pt requesting gingerale. md states pt is NPO. Pt informed of NPO status.

## 2018-02-04 NOTE — ED Notes (Signed)
Pt placed in wheelchair to be discharged by previous nurse. Pt still appears shaky and diaphoretic but able to keep ice chips down over the previous hour per nole RN

## 2018-02-04 NOTE — ED Triage Notes (Signed)
Pt was waiting in the lobby for a cab, pt states he needs medication for the shakes due to detox. Pt has had severe shakes while waiting.

## 2018-02-05 ENCOUNTER — Inpatient Hospital Stay: Payer: Medicaid Other | Admitting: Anesthesiology

## 2018-02-05 ENCOUNTER — Encounter
Admission: EM | Disposition: A | Discharge status: Home / Self Care | Payer: Self-pay | Source: Home / Self Care | Attending: Internal Medicine

## 2018-02-05 DIAGNOSIS — K3189 Other diseases of stomach and duodenum: Secondary | ICD-10-CM

## 2018-02-05 DIAGNOSIS — K267 Chronic duodenal ulcer without hemorrhage or perforation: Secondary | ICD-10-CM

## 2018-02-05 DIAGNOSIS — K253 Acute gastric ulcer without hemorrhage or perforation: Secondary | ICD-10-CM

## 2018-02-05 HISTORY — PX: ESOPHAGOGASTRODUODENOSCOPY: SHX5428

## 2018-02-05 LAB — CBC
HEMATOCRIT: 34.4 % — AB (ref 40.0–52.0)
HEMOGLOBIN: 11.1 g/dL — AB (ref 13.0–18.0)
MCH: 26.6 pg (ref 26.0–34.0)
MCHC: 32.3 g/dL (ref 32.0–36.0)
MCV: 82.5 fL (ref 80.0–100.0)
PLATELETS: 358 10*3/uL (ref 150–440)
RBC: 4.17 MIL/uL — AB (ref 4.40–5.90)
RDW: 21.3 % — ABNORMAL HIGH (ref 11.5–14.5)
WBC: 5.2 10*3/uL (ref 3.8–10.6)

## 2018-02-05 LAB — BASIC METABOLIC PANEL
ANION GAP: 9 (ref 5–15)
BUN: 6 mg/dL (ref 6–20)
CHLORIDE: 103 mmol/L (ref 98–111)
CO2: 26 mmol/L (ref 22–32)
Calcium: 8.5 mg/dL — ABNORMAL LOW (ref 8.9–10.3)
Creatinine, Ser: 0.62 mg/dL (ref 0.61–1.24)
Glucose, Bld: 117 mg/dL — ABNORMAL HIGH (ref 70–99)
POTASSIUM: 3.3 mmol/L — AB (ref 3.5–5.1)
SODIUM: 138 mmol/L (ref 135–145)

## 2018-02-05 LAB — GLUCOSE, CAPILLARY
GLUCOSE-CAPILLARY: 113 mg/dL — AB (ref 70–99)
Glucose-Capillary: 119 mg/dL — ABNORMAL HIGH (ref 70–99)
Glucose-Capillary: 124 mg/dL — ABNORMAL HIGH (ref 70–99)
Glucose-Capillary: 149 mg/dL — ABNORMAL HIGH (ref 70–99)

## 2018-02-05 SURGERY — EGD (ESOPHAGOGASTRODUODENOSCOPY)
Anesthesia: General

## 2018-02-05 MED ORDER — POTASSIUM CHLORIDE CRYS ER 20 MEQ PO TBCR
40.0000 meq | EXTENDED_RELEASE_TABLET | Freq: Once | ORAL | Status: AC
  Administered 2018-02-05: 40 meq via ORAL
  Filled 2018-02-05: qty 2

## 2018-02-05 MED ORDER — PANTOPRAZOLE SODIUM 40 MG PO TBEC
40.0000 mg | DELAYED_RELEASE_TABLET | Freq: Two times a day (BID) | ORAL | Status: DC
  Administered 2018-02-05 – 2018-02-07 (×4): 40 mg via ORAL
  Filled 2018-02-05 (×4): qty 1

## 2018-02-05 MED ORDER — PROPOFOL 10 MG/ML IV BOLUS
INTRAVENOUS | Status: DC | PRN
  Administered 2018-02-05: 80 mg via INTRAVENOUS

## 2018-02-05 MED ORDER — PROPOFOL 500 MG/50ML IV EMUL
INTRAVENOUS | Status: DC | PRN
  Administered 2018-02-05: 175 ug/kg/min via INTRAVENOUS

## 2018-02-05 MED ORDER — ACETAMINOPHEN 325 MG PO TABS: 650.0000 mg | ORAL_TABLET | Freq: Four times a day (QID) | ORAL | Status: DC | PRN

## 2018-02-05 MED ORDER — LIDOCAINE HCL (CARDIAC) PF 100 MG/5ML IV SOSY
PREFILLED_SYRINGE | INTRAVENOUS | Status: DC | PRN
  Administered 2018-02-05: 50 mg via INTRAVENOUS

## 2018-02-05 MED ORDER — OXYCODONE HCL 5 MG PO TABS
5.0000 mg | ORAL_TABLET | ORAL | Status: DC | PRN
  Administered 2018-02-05 (×2): 10 mg via ORAL
  Administered 2018-02-05: 5 mg via ORAL
  Administered 2018-02-06 – 2018-02-07 (×9): 10 mg via ORAL
  Filled 2018-02-05 (×6): qty 2
  Filled 2018-02-05: qty 1
  Filled 2018-02-05 (×5): qty 2

## 2018-02-05 MED ORDER — PROPOFOL 10 MG/ML IV BOLUS
INTRAVENOUS | Status: AC
  Filled 2018-02-05: qty 20

## 2018-02-05 NOTE — Progress Notes (Signed)
   [name], MD [address], [place], [place], [address] [address], [place], [place], [address] Phone: [phone]  Fax: [phone]   Subjective:  Patient denies any further hematochezia.  No hematemesis.  No abdominal pain.  Objective: Exam: Vital signs in last 24 hours: Vitals:   02/04/18 1730 02/04/18 1751 02/04/18 1938 02/05/18 0432  BP: (!) 168/80 (!) 162/76 (!) 148/82 131/79  Pulse:  93 97 77  Resp:  18    Temp:  98.7 F (37.1 C) 98.9 F (37.2 C) 98.7 F (37.1 C)  TempSrc:  Oral Oral Oral  SpO2:  90% 100% 96%  Weight:  83 kg    Height:  5\' 11"  (1.803 m)     Weight change:   Intake/Output Summary (Last 24 hours) at 02/05/2018 1233 Last data filed at 02/05/2018 9629 Gross per 24 hour  Intake 141.93 ml  Output 1400 ml  Net -1258.07 ml    General: No acute distress, AAO x3 Abd: Soft, NT/ND, No HSM Skin: Warm, no rashes Neck: Supple, Trachea midline   Lab Results: Lab Results  Component Value Date   WBC 5.2 02/05/2018   HGB 11.1 (L) 02/05/2018   HCT 34.4 (L) 02/05/2018   MCV 82.5 02/05/2018   PLT 358 02/05/2018   Micro Results: No results found for this or any previous visit (from the past 240 hour(s)). Studies/Results: No results found. Medications:  Scheduled Meds: . [MAR Hold] amiodarone  200 mg Oral Daily  . [MAR Hold] busPIRone  15 mg Oral BID  . [MAR Hold] citalopram  20 mg Oral Daily  . [MAR Hold] diltiazem  180 mg Oral Daily  . [MAR Hold] folic acid  1 mg Oral Daily  . [MAR Hold] gabapentin  600 mg Oral TID  . [MAR Hold] insulin aspart  0-5 Units Subcutaneous QHS  . [MAR Hold] insulin aspart  0-9 Units Subcutaneous TID WC  . [MAR Hold] metoprolol succinate  50 mg Oral Daily  . [MAR Hold] multivitamin with minerals  1 tablet Oral Daily  . [MAR Hold] thiamine  100 mg Oral Daily   Or  . [MAR Hold] thiamine  100 mg Intravenous Daily   Continuous Infusions: . sodium chloride 20 mL/hr at 02/04/18 1819  .  pantoprozole (PROTONIX) infusion 8 mg/hr (02/05/18 0244)   PRN Meds:.[MAR Hold] acetaminophen, [MAR Hold] hydrALAZINE, [MAR Hold] LORazepam **OR** [MAR Hold] LORazepam, [MAR Hold] LORazepam, [MAR Hold] ondansetron **OR** [MAR Hold] ondansetron (ZOFRAN) IV, [MAR Hold] oxyCODONE   Assessment: Active Problems:   GI bleed    Plan: Hematochezia has now resolved We will plan on EGD to rule out any upper source of GI bleed If this is negative, and no further hematochezia or active GI bleeding occurs, patient can follow-up as an outpatient to discuss colonoscopy when he is further out from his alcohol withdrawal. His hematochezia is resolved, getting colonoscopy prep can lead to dehydration, and worsen his alcohol withdrawal, therefore would avoid colonoscopy at this time as benefits of procedure do not outweigh risks at this time.  Plan discussed with patient he is in agreement Continue serial CBCs and transfuse PRN Avoid NSAIDs See EGD report for further findings and recommendations after procedure today   LOS: 1 day   [name], MD 02/05/2018, 12:33 PM

## 2018-02-05 NOTE — Anesthesia Preprocedure Evaluation (Signed)
Anesthesia Evaluation  Patient identified by MRN, date of birth, ID band Patient awake    Reviewed: Allergy & Precautions, H&P , NPO status , Patient's Chart, lab work & pertinent test results, reviewed documented beta blocker date and time   Airway Mallampati: II   Neck ROM: full    Dental  (+) Poor Dentition, Teeth Intact   Pulmonary neg pulmonary ROS, shortness of breath and with exertion, Current Smoker,    Pulmonary exam normal        Cardiovascular Exercise Tolerance: Poor hypertension, On Medications + Peripheral Vascular Disease and +CHF  (-) Orthopnea negative cardio ROS Normal cardiovascular exam Rhythm:regular Rate:Normal     Neuro/Psych PSYCHIATRIC DISORDERS Anxiety Depression  Neuromuscular disease negative neurological ROS  negative psych ROS   GI/Hepatic negative GI ROS, Neg liver ROS,   Endo/Other  negative endocrine ROSdiabetes, Well Controlled, Type 2  Renal/GU Renal diseasenegative Renal ROS  negative genitourinary   Musculoskeletal   Abdominal   Peds  Hematology negative hematology ROS (+)   Anesthesia Other Findings Past Medical History: No date: A-fib (HCC) No date: Alcohol abuse 11/12/2016: Alcohol withdrawal (HCC) No date: Diabetes mellitus without complication (HCC) No date: DJD (degenerative joint disease) No date: Hypertension No date: Myasthenia gravis (HCC) No date: Renal disorder Past Surgical History: 12/29/2015: AMPUTATION TOE; Left     Comment:  Procedure: AMPUTATION TOE;  Surgeon: Gwyneth Revels, DPM;              Location: [facility] ORS;  Service: Podiatry;  Laterality:               Left; 12/27/2015: PERIPHERAL VASCULAR CATHETERIZATION; N/A     Comment:  Procedure: Lower Extremity Angiography;  Surgeon: Annice Needy, MD;  Location: [facility] INVASIVE CV LAB;  Service:               Cardiovascular;  Laterality: N/A; No date: right hip surgery No date: right shoulder  surgery BMI    Body Mass Index:  25.52 kg/m     Reproductive/Obstetrics negative OB ROS                             Anesthesia Physical Anesthesia Plan  ASA: III and emergent  Anesthesia Plan: General   Post-op Pain Management:    Induction:   PONV Risk Score and Plan:   Airway Management Planned:   Additional Equipment:   Intra-op Plan:   Post-operative Plan:   Informed Consent: I have reviewed the patients History and Physical, chart, labs and discussed the procedure including the risks, benefits and alternatives for the proposed anesthesia with the patient or authorized representative who has indicated his/her understanding and acceptance.   Dental Advisory Given  Plan Discussed with: CRNA  Anesthesia Plan Comments:         Anesthesia Quick Evaluation

## 2018-02-05 NOTE — Op Note (Signed)
[facility] Patient Name: Kurt Horton Procedure Date: 02/05/2018 12:25 PM MRN: [id number] Account #: [id number] Date of Birth: December 02, 1960 Admit Type: Inpatient Age: 57 Room: [facility] ENDO ROOM 1 Gender: Male Note Status: Finalized Procedure:            Upper GI endoscopy Indications:          Hematochezia, Follow-up of chronic duodenal ulcer Providers:            [name] MD, MD Referring MD:         [name], MD (Referring MD) Medicines:            Monitored Anesthesia Care Complications:        No immediate complications. Procedure:            Pre-Anesthesia Assessment:                       - Prior to the procedure, a History and Physical was                        performed, and patient medications, allergies and                        sensitivities were reviewed. The patient's tolerance of                        previous anesthesia was reviewed.                       - The risks and benefits of the procedure and the                        sedation options and risks were discussed with the                        patient. All questions were answered and informed                        consent was obtained.                       - Patient identification and proposed procedure were                        verified prior to the procedure by the physician, the                        nurse, the anesthesiologist, the anesthetist and the                        technician. The procedure was verified in the procedure                        room.                       - ASA Grade Assessment: III - A patient with severe                        systemic disease.  After obtaining informed consent, the endoscope was                        passed under direct vision. Throughout the procedure,                        the patient's blood pressure, pulse, and oxygen                        saturations were monitored  continuously. The Endoscope                        was introduced through the mouth, and advanced to the                        second part of duodenum. The upper GI endoscopy was                        accomplished with ease. The patient tolerated the                        procedure well. Findings:      The examined esophagus was normal.      Patchy mildly erythematous mucosa without bleeding was found in the       gastric antrum. Biopsies were taken with a cold forceps for histology.       Biopsies were obtained in the gastric body, at the incisura and in the       gastric antrum with cold forceps for histology.      One non-bleeding gastric ulcer with a clean ulcer base (Forrest Class       III) was found in the stomach. The lesion was 4 mm in largest dimension.       Biopsies were taken with a cold forceps for histology.      The duodenal bulb, second portion of the duodenum and examined duodenum       were normal.      There is no endoscopic evidence of varices in the entire esophagus.      There is no endoscopic evidence of varices in the stomach. Impression:           - Normal esophagus.                       - Erythematous mucosa in the antrum. Biopsied.                       - Non-bleeding gastric ulcer with a clean ulcer base                        (Forrest Class III). Biopsied.                       - Normal duodenal bulb, second portion of the duodenum                        and examined duodenum.                       - Biopsies were obtained in the gastric body, at the  incisura and in the gastric antrum. Recommendation:       - Await pathology results.                       - Advance diet as tolerated.                       - Continue present medications.                       - Patient has a contact number available for                        emergencies. The signs and symptoms of potential                        delayed complications were  discussed with the patient.                        Return to normal activities tomorrow. Written discharge                        instructions were provided to the patient.                       - The findings and recommendations were discussed with                        the patient.                       - The findings and recommendations were discussed with                        the patient's family.                       - Continue Serial CBCs and transfuse PRN                       - Return to my office in 4 weeks.                       - Return to primary care physician in 2 weeks. Procedure Code(s):    --- Professional ---                       [phone], Esophagogastroduodenoscopy, flexible, transoral;                        with biopsy, single or multiple Diagnosis Code(s):    --- Professional ---                       K31.89, Other diseases of stomach and duodenum                       K25.9, Gastric ulcer, unspecified as acute or chronic,                        without hemorrhage or perforation                       K92.1,  Melena (includes Hematochezia)                       K26.7, Chronic duodenal ulcer without hemorrhage or                        perforation CPT copyright 2017 American Medical Association. All rights reserved. The codes documented in this report are preliminary and upon coder review may  be revised to meet current compliance requirements.  [name], MD [name] MD, MD 02/05/2018 12:53:05 PM This report has been signed electronically. Number of Addenda: 0 Note Initiated On: 02/05/2018 12:25 PM Estimated Blood Loss: Estimated blood loss: none.      [facility]

## 2018-02-05 NOTE — Anesthesia Post-op Follow-up Note (Signed)
Anesthesia QCDR form completed.        

## 2018-02-05 NOTE — Transfer of Care (Signed)
Immediate Anesthesia Transfer of Care Note  Patient: Kurt Horton  Procedure(s) Performed: ESOPHAGOGASTRODUODENOSCOPY (EGD) (N/A )  Patient Location: PACU  Anesthesia Type:General  Level of Consciousness: drowsy  Airway & Oxygen Therapy: Patient Spontanous Breathing and Patient connected to nasal cannula oxygen  Post-op Assessment: Report given to RN and Post -op Vital signs reviewed and stable  Post vital signs: Reviewed and stable  Last Vitals:  Vitals Value Taken Time  BP 129/87 02/05/2018 12:53 PM  Temp 35.9 C 02/05/2018 12:53 PM  Pulse 67 02/05/2018 12:54 PM  Resp 18 02/05/2018 12:54 PM  SpO2 99 % 02/05/2018 12:54 PM    Last Pain:  Vitals:   02/05/18 1253  TempSrc: Tympanic  PainSc: 0-No pain         Complications: No apparent anesthesia complications

## 2018-02-05 NOTE — Anesthesia Procedure Notes (Signed)
Date/Time: 02/05/2018 12:26 PM Performed by: Ginger Carne, CRNA Pre-anesthesia Checklist: Patient identified, Emergency Drugs available, Suction available, Patient being monitored and Timeout performed Patient Re-evaluated:Patient Re-evaluated prior to induction Oxygen Delivery Method: Nasal cannula Preoxygenation: Pre-oxygenation with 100% oxygen Induction Type: IV induction

## 2018-02-05 NOTE — Progress Notes (Signed)
[facility] [place] [facility]   PATIENT NAME: Kurt Horton    MR#:  [id number]  DATE OF BIRTH:  [birth year]  SUBJECTIVE:   States that his endoscopy went well. Not having any additional bloody bowel movements. No melena.  REVIEW OF SYSTEMS:  Review of Systems  Constitutional: Negative for chills and fever.  HENT: Negative for congestion and sore throat.   Eyes: Negative for blurred vision and double vision.  Respiratory: Negative for cough and shortness of breath.   Cardiovascular: Negative for chest pain, palpitations and leg swelling.  Gastrointestinal: Negative for abdominal pain, blood in stool, nausea and vomiting.  Genitourinary: Negative for dysuria and frequency.  Musculoskeletal: Positive for back pain, joint pain and neck pain.  Neurological: Negative for dizziness and headaches.  Psychiatric/Behavioral: Negative for depression. The patient is not nervous/anxious.     DRUG ALLERGIES:   Allergies  Allergen Reactions  . Nsaids Other (See Comments)    Duodenal ulcers, GI bleeding  . Other Other (See Comments)    Patient states he can't take any "mycin" medications because myasthenia gravis.   VITALS:  Blood pressure (!) 137/91, pulse 61, temperature 98.4 F (36.9 C), temperature source Oral, resp. rate 15, height 5\' 11"  (1.803 m), weight 83 kg, SpO2 98 %. PHYSICAL EXAMINATION:  Physical Exam  GENERAL:  57 y.o.-year-old patient lying in the bed with no acute distress.  EYES: Pupils equal, round, reactive to light and accommodation. No scleral icterus. Extraocular muscles intact.  HEENT: Head atraumatic, normocephalic. Oropharynx and nasopharynx clear. Moist mucous membranes. NECK:  Supple, no jugular venous distention. No thyroid enlargement, no tenderness.  LUNGS: Normal breath sounds bilaterally, no wheezing, rales,rhonchi or crepitation. No use of accessory muscles of respiration.  CARDIOVASCULAR: RRR, S1, S2 normal. No murmurs, rubs, or  gallops.  ABDOMEN: Soft, nontender, +fluid wave. Bowel sounds present. No organomegaly or mass.  EXTREMITIES: No pedal edema, cyanosis, or clubbing.  NEUROLOGIC: Cranial nerves II through XII are intact. Muscle strength 5/5 in all extremities. Sensation intact. Gait not checked. +tremor in the bilateral upper extremities. PSYCHIATRIC: The patient is alert and oriented x 3.  SKIN: No obvious rash, lesion, or ulcer.  LABORATORY PANEL:  Male CBC Recent Labs  Lab 02/05/18 0355  WBC 5.2  HGB 11.1*  HCT 34.4*  PLT 358   ------------------------------------------------------------------------------------------------------------------ Chemistries  Recent Labs  Lab 02/04/18 1014 02/05/18 0355  NA 138 138  K 3.4* 3.3*  CL 101 103  CO2 22 26  GLUCOSE 161* 117*  BUN 9 6  CREATININE 0.81 0.62  CALCIUM 8.0* 8.5*  AST 59*  --   ALT 35  --   ALKPHOS 85  --   BILITOT 0.6  --    RADIOLOGY:  No results found. ASSESSMENT AND PLAN:   GI bleed- Hgb stable. Bloody BMs have stopped. - GI following- endoscopy today with non-bleeding ulcer, no colonoscopy this admission as colonoscopy prep can cause dehydration and make alcohol withdrawal worse - f/u in GI office in 4 weeks - switch IV PPI to po - hold anticoagulation - trend hgb  Alcohol withdrawal with a history of alcohol abuse- last drink 9/29 in the evening - CIWA protocol  Chronic atrial fibrillation- in NSR now. - hold aspirin with GI bleed - continue amiodarone, diltiazem, and metoprolol  Chronic combined systolic and diastolic heart failure- last ECHO with EF 40% and G1DD. Does not appear volume overloaded. - monitor - hold on fluids  Hypertension- normotensive - continue  metoprolol - hydralazine prn  Type 2 diabetes- on metformin at home - sensitive SSI  Depression- stable - continue home buspar and celexa  Myasthenia gravis- stable, no signs of acute exacerbation - continue cellcept 500mg  in the morning  and 1,000mg  in the evening  All the records are reviewed and case discussed with Care Management/Social Worker. Management plans discussed with the patient, family and they are in agreement.  CODE STATUS: Full Code  TOTAL TIME TAKING CARE OF THIS PATIENT: 35 minutes.   More than 50% of the time was spent in counseling/coordination of care: YES  POSSIBLE D/C IN 1-2 DAYS, DEPENDING ON CLINICAL CONDITION.   Jinny Blossom Mayo M.D on 02/05/2018 at 5:28 PM  Between 7am to 6pm - Pager [phone]  After 6pm go to [email or web address] - Social research officer, government  [facility]  Office  [phone]  CC: Primary care physician; [name], MD  Note: This dictation was prepared with Dragon dictation along with smaller phrase technology. Any transcriptional errors that result from this process are unintentional.

## 2018-02-05 NOTE — Care Management Note (Signed)
Case Management Note  Patient Details  Name: Kurt Horton MRN: [id number] Date of Birth: 04-Sep-1960  Subjective/Objective:          Patient from home with GI bleed.  He is a chronic alcoholic with history of multiple admissions.  He lives with his elderly parents and he plans to discharge and find a 1 bedroom apartment.  He states his parents are coming up to visit this evening and bringing him some of his belongings so he does not need to return to their house.  He has a few apartments in mind.  He receives disability and states he is able to afford a small one bedroom apartment.  Financial counseling came today to assist him with completing an Medicaid application.  He did not reapply when it was annually required and was dropped.  He gets his medications at [facility] and he states they have given him his medications the last couple of time for the Medicaid cost of $3.00.  He denies issues with transportation.  He uses [facility] to transport as he cannot drive due to his peripheral neuropathy.   He has a walker and a cane as needed.          Action/Plan:   Expected Discharge Date:                  Expected Discharge Plan:     In-House Referral:     Discharge planning Services  CM Consult  Post Acute Care Choice:    Choice offered to:     DME Arranged:    DME Agency:     HH Arranged:    HH Agency:     Status of Service:  In process, will continue to follow  If discussed at Long Length of Stay Meetings, dates discussed:    Additional Comments:  Sherren Kerns, RN 02/05/2018, 5:22 PM

## 2018-02-05 NOTE — Care Management (Signed)
Call into financial counseling for assistance with reapplying for Medicaid.  They have him on their work que and will see patient.

## 2018-02-06 ENCOUNTER — Encounter: Payer: Self-pay | Admitting: Gastroenterology

## 2018-02-06 LAB — GLUCOSE, CAPILLARY
GLUCOSE-CAPILLARY: 120 mg/dL — AB (ref 70–99)
GLUCOSE-CAPILLARY: 128 mg/dL — AB (ref 70–99)
Glucose-Capillary: 107 mg/dL — ABNORMAL HIGH (ref 70–99)
Glucose-Capillary: 109 mg/dL — ABNORMAL HIGH (ref 70–99)

## 2018-02-06 LAB — BASIC METABOLIC PANEL
Anion gap: 9 (ref 5–15)
BUN: 10 mg/dL (ref 6–20)
CALCIUM: 8.8 mg/dL — AB (ref 8.9–10.3)
CO2: 23 mmol/L (ref 22–32)
CREATININE: 0.89 mg/dL (ref 0.61–1.24)
Chloride: 104 mmol/L (ref 98–111)
GFR calc Af Amer: >60 mL/min (ref >60)
GFR calc non Af Amer: >60 mL/min (ref >60)
GLUCOSE: 154 mg/dL — AB (ref 70–99)
Potassium: 3.9 mmol/L (ref 3.5–5.1)
Sodium: 136 mmol/L (ref 135–145)

## 2018-02-06 LAB — CBC
HEMATOCRIT: 36.5 % — AB (ref 40.0–52.0)
Hemoglobin: 11.9 g/dL — ABNORMAL LOW (ref 13.0–18.0)
MCH: 27.5 pg (ref 26.0–34.0)
MCHC: 32.7 g/dL (ref 32.0–36.0)
MCV: 84.1 fL (ref 80.0–100.0)
Platelets: 325 10*3/uL (ref 150–440)
RBC: 4.34 MIL/uL — ABNORMAL LOW (ref 4.40–5.90)
RDW: 21.3 % — AB (ref 11.5–14.5)
WBC: 7.1 10*3/uL (ref 3.8–10.6)

## 2018-02-06 LAB — SURGICAL PATHOLOGY

## 2018-02-06 MED ORDER — MYCOPHENOLATE MOFETIL 250 MG PO CAPS
500.0000 mg | ORAL_CAPSULE | Freq: Every morning | ORAL | Status: DC
  Administered 2018-02-06 – 2018-02-07 (×2): 500 mg via ORAL
  Filled 2018-02-06 (×2): qty 2

## 2018-02-06 MED ORDER — MYCOPHENOLATE MOFETIL 250 MG PO CAPS
1000.0000 mg | ORAL_CAPSULE | Freq: Every day | ORAL | Status: DC
  Administered 2018-02-06: 1000 mg via ORAL
  Filled 2018-02-06 (×2): qty 4

## 2018-02-06 NOTE — Progress Notes (Signed)
[facility] [place] [facility]   PATIENT NAME: Kurt Horton    MR#:  [id number]  DATE OF BIRTH:  [birth year]  SUBJECTIVE: Still shaking, still having some tremors, also requesting pain medicine says he has chronic back pain, left shoulder pain.  Requesting more narcotics and more Ativan.  Patient complains of tremors, sweating, palpitations still present.  Denies any other complaints, hemodynamically stable.     REVIEW OF SYSTEMS:  Review of Systems  Constitutional: Negative for chills and fever.  HENT: Negative for congestion and sore throat.   Eyes: Negative for blurred vision and double vision.  Respiratory: Negative for cough and shortness of breath.   Cardiovascular: Negative for chest pain, palpitations and leg swelling.  Gastrointestinal: Negative for abdominal pain, blood in stool, nausea and vomiting.  Genitourinary: Negative for dysuria and frequency.  Musculoskeletal: Positive for back pain, joint pain and neck pain.  Neurological: Negative for dizziness and headaches.  Psychiatric/Behavioral: Negative for depression. The patient is not nervous/anxious.     DRUG ALLERGIES:   Allergies  Allergen Reactions  . Nsaids Other (See Comments)    Duodenal ulcers, GI bleeding  . Other Other (See Comments)    Patient states he can't take any "mycin" medications because myasthenia gravis.   VITALS:  Blood pressure 132/86, pulse 68, temperature 97.7 F (36.5 C), temperature source Oral, resp. rate 18, height 5\' 11"  (1.803 m), weight 83 kg, SpO2 96 %. PHYSICAL EXAMINATION:  Physical Exam  GENERAL:  57 y.o.-year-old patient lying in the bed with no acute distress.  EYES: Pupils equal, round, reactive to light and accommodation. No scleral icterus. Extraocular muscles intact.  HEENT: Head atraumatic, normocephalic. Oropharynx and nasopharynx clear. Moist mucous membranes. NECK:  Supple, no jugular venous distention. No thyroid enlargement, no tenderness.    LUNGS: Normal breath sounds bilaterally, no wheezing, rales,rhonchi or crepitation. No use of accessory muscles of respiration.  CARDIOVASCULAR: RRR, S1, S2 normal. No murmurs, rubs, or gallops.  ABDOMEN: Soft, nontender, +fluid wave. Bowel sounds present. No organomegaly or mass.  EXTREMITIES: No pedal edema, cyanosis, or clubbing.  Decreased range of motion on the left shoulder joint. NEUROLOGIC: Cranial nerves II through XII are intact. Muscle strength 5/5 in all extremities. Sensation intact. Gait not checked. +tremor in the bilateral upper extremities. PSYCHIATRIC: The patient is alert and oriented x 3.  SKIN: No obvious rash, lesion, or ulcer.  LABORATORY PANEL:  Male CBC Recent Labs  Lab 02/06/18 0640  WBC 7.1  HGB 11.9*  HCT 36.5*  PLT 325   ------------------------------------------------------------------------------------------------------------------ Chemistries  Recent Labs  Lab 02/04/18 1014  02/06/18 0640  NA 138   < > 136  K 3.4*   < > 3.9  CL 101   < > 104  CO2 22   < > 23  GLUCOSE 161*   < > 154*  BUN 9   < > 10  CREATININE 0.81   < > 0.89  CALCIUM 8.0*   < > 8.8*  AST 59*  --   --   ALT 35  --   --   ALKPHOS 85  --   --   BILITOT 0.6  --   --    < > = values in this interval not displayed.   RADIOLOGY:  No results found. ASSESSMENT AND PLAN:   GI bleed- Hgb stable. Bloody BMs have stopped. - GI following- endoscopy today with non-bleeding ulcer, no colonoscopy this admission as colonoscopy prep can cause dehydration  and make alcohol withdrawal worse - f/u in GI office in 4 weeks -Continue p.o. PPIs. -Stable, no further GI bleed, hemoglobin stable continue PPIs for 30 to 60 days as per GI note , likely  Alcohol withdrawal with a history of alcohol abuse- last drink 9/29 in the evening - CIWA protocol, patient still complains of tremors, sweating, palpitations, use Ativan and wash tonight and likely discharge home tomorrow with Ativan advised to  quit alcohol.  Chronic atrial fibrillation- in NSR now. - hold aspirin with GI bleed, hold aspirin until colonoscopy is done by GI as an outpatient. - continue amiodarone, diltiazem, and metoprolol  Chronic combined systolic and diastolic heart failure- last ECHO with EF 40% and G1DD. Does not appear volume overloaded. - monitor - hold on fluids, patient denies any shortness of breath or pedal edema.  Not hypoxic.  Hypertension- normotensive - continue metoprolol - hydralazine prn  Type 2 diabetes- on metformin at home - sensitive SSI  Depression- stable - continue home buspar and celexa  Myasthenia gravis- stable, no signs of acute exacerbation - continue cellcept 500mg  in the morning and 1,000mg  in the evening, reorder them.  Chronic back pain, left shoulder pain, requesting higher doses of narcotics, told me that he was on fentanyl patch long time ago and oxycodone 10 mg 3 times daily but unable to give fentanyl patch or higher dose of narcotics than he is already getting, patient can follow-up with PCP Dr. Marcello Fennel for his chronic pain medicines.  All the records are reviewed and case discussed with Care Management/Social Worker. Management plans discussed with the patient, family and they are in agreement.  CODE STATUS: Full Code  TOTAL TIME TAKING CARE OF THIS PATIENT: 57 minutes.   More than 50% of the time was spent in counseling/coordination of care: YES  POSSIBLE D/C IN 1-2 DAYS, DEPENDING ON CLINICAL CONDITION.   Katha Hamming M.D on 02/06/2018 at 5:09 PM  Between 7am to 6pm - Pager - [phone]  After 6pm go to [email or web address] - Social research officer, government  [facility]  Office  [phone]  CC: Primary care physician; [name], MD  Note: This dictation was prepared with Dragon dictation along with smaller phrase technology. Any transcriptional errors that result from this process are unintentional.

## 2018-02-06 NOTE — Plan of Care (Signed)

## 2018-02-06 NOTE — Progress Notes (Signed)
   [name], MD [address], [place], [place], [address] [address], [address], [place], [place], [address] Phone: [phone]  Fax: [phone]   Subjective: No further signs of GI bleeding.  No nausea or vomiting.  No hematemesis.  No hematochezia or melena.   Objective: Exam: Vital signs in last 24 hours: Vitals:   02/05/18 1646 02/05/18 2045 02/06/18 0252 02/06/18 0730  BP: (!) 137/91 130/87 136/88 124/82  Pulse: 61 71 71 72  Resp:    18  Temp: 98.4 F (36.9 C) 98.4 F (36.9 C) 98.4 F (36.9 C) 98.7 F (37.1 C)  TempSrc: Oral Oral Oral Oral  SpO2: 98% 98% 97% 94%  Weight:      Height:       Weight change:   Intake/Output Summary (Last 24 hours) at 02/06/2018 1100 Last data filed at 02/06/2018 1027 Gross per 24 hour  Intake 100 ml  Output 1400 ml  Net -1300 ml    General: No acute distress, AAO x3 Abd: Soft, NT/ND, No HSM Skin: Warm, no rashes Neck: Supple, Trachea midline   Lab Results: Lab Results  Component Value Date   WBC 7.1 02/06/2018   HGB 11.9 (L) 02/06/2018   HCT 36.5 (L) 02/06/2018   MCV 84.1 02/06/2018   PLT 325 02/06/2018   Micro Results: No results found for this or any previous visit (from the past 240 hour(s)). Studies/Results: No results found. Medications:  Scheduled Meds: . amiodarone  200 mg Oral Daily  . busPIRone  15 mg Oral BID  . citalopram  20 mg Oral Daily  . diltiazem  180 mg Oral Daily  . folic acid  1 mg Oral Daily  . gabapentin  600 mg Oral TID  . insulin aspart  0-5 Units Subcutaneous QHS  . insulin aspart  0-9 Units Subcutaneous TID WC  . metoprolol succinate  50 mg Oral Daily  . multivitamin with minerals  1 tablet Oral Daily  . mycophenolate  1,000 mg Oral QHS  . mycophenolate  500 mg Oral q morning - 10a  . pantoprazole  40 mg Oral BID  . thiamine  100 mg Oral Daily   Or  . thiamine  100 mg Intravenous Daily   Continuous Infusions: PRN Meds:.acetaminophen, hydrALAZINE, LORazepam  **OR** LORazepam, LORazepam, ondansetron **OR** ondansetron (ZOFRAN) IV, oxyCODONE   Assessment: Active Problems:   GI bleed    Plan: Hematochezia has resolved (Hematochezia was likely due to the red fruit punch patient drank that day as patient's hemoglobin has not dropped throughout his stay and he did not have any further episodes after the first 1-2 episodes after he presented to the hospital that pt described as looking like the red drink he ingested and did not look like blood and did not have clots)  EGD yesterday showed clean-based gastric ulcer Biopsies are pending Continue Protonix for 30 to 60 days and then discontinue Avoid NSAIDs  Follow-up as an outpatient to discuss screening colonoscopy in GI clinic. Please make appointment before discharge Elective colonoscopy not recommended at this time given patient's alcohol withdrawal, and screening colonoscopy test is best done when patient is medically optimized  GI service will sign off, please page with any questions   LOS: 2 days   [name], MD 02/06/2018, 11:00 AM

## 2018-02-07 LAB — GLUCOSE, CAPILLARY
GLUCOSE-CAPILLARY: 131 mg/dL — AB (ref 70–99)
Glucose-Capillary: 104 mg/dL — ABNORMAL HIGH (ref 70–99)

## 2018-02-07 MED ORDER — OXYCODONE HCL 5 MG PO TABS: 5.0000 mg | ORAL_TABLET | ORAL | 0 refills | Status: DC | PRN

## 2018-02-07 MED ORDER — LORAZEPAM 1 MG PO TABS: 1.0000 mg | ORAL_TABLET | Freq: Three times a day (TID) | ORAL | 0 refills | Status: DC | PRN

## 2018-02-07 NOTE — Progress Notes (Signed)
Pt discharging to home with home health. IV and heart monitor removed. Pt assisted with dressing. Discharge instructions and education reviewed and provided to patient.

## 2018-02-07 NOTE — Care Management Note (Signed)
Case Management Note  Patient Details  Name: Kurt Horton MRN: [id number] Date of Birth: 05/10/1960  Subjective/Objective:       Patient being discharged today after admission for GI bleed.   Chronic alcoholic.  Medicaid ran out in August and patient did not reapply.  RNCM contacted financial services and they came to patients room and filled out paperwork for Medicaid.  Patient does get approximately $2000.00/mo in disability.  Denies difficulty at this time obtaining medications as he lives with his parents.  He is planning on getting an apartment once discharged.  No transportation issue.  He states he uses [facility] and can afford that.  He does want Medicaid in place for when he is on his own paying for housing.  He is current with his PCP.  MD ordered home health PT and RN.  As he does not have insurance RNCM asked  [facility].   They will not take patient because of past visits involving alcohol related inappropriate behavior.  Notified MD and patient.                  Action/Plan:   Expected Discharge Date:  02/07/18               Expected Discharge Plan:  Home/Self Care  In-House Referral:     Discharge planning Services  CM Consult  Post Acute Care Choice:    Choice offered to:     DME Arranged:    DME Agency:     HH Arranged:    HH Agency:     Status of Service:  Completed, signed off  If discussed at [employer] of Stay Meetings, dates discussed:    Additional Comments:  Sherren Kerns, RN 02/07/2018, 1:57 PM

## 2018-02-07 NOTE — Progress Notes (Signed)
Patient is stable for discharge.  Discharge instructions on the computer.  Advised to quit alcohol, requested narcotic pain medicines, Ativan given limited supply and advised to follow-up with PCP regarding his long-term need for narcotics, antianxiety medicine.  Discharging home, case manager told that he was hostile to health physical therapy before admitted 8 times the last 6 months.  Asked to discontinue aspirin, continue PPI for at least 1 to 2 months and follow-up with GI as an outpatient for colonoscopy.

## 2018-02-08 NOTE — Anesthesia Postprocedure Evaluation (Signed)
Anesthesia Post Note  Patient: Kurt Horton  Procedure(s) Performed: ESOPHAGOGASTRODUODENOSCOPY (EGD) (N/A )  Patient location during evaluation: PACU Anesthesia Type: General Level of consciousness: awake and alert Pain management: pain level controlled Vital Signs Assessment: post-procedure vital signs reviewed and stable Respiratory status: spontaneous breathing, nonlabored ventilation, respiratory function stable and patient connected to nasal cannula oxygen Cardiovascular status: blood pressure returned to baseline and stable Postop Assessment: no apparent nausea or vomiting Anesthetic complications: no     Last Vitals:  Vitals:   02/07/18 0440 02/07/18 0713  BP: 116/72 121/68  Pulse: 69 75  Resp: 16 18  Temp: 37.1 C 36.8 C  SpO2: 97% 95%    Last Pain:  Vitals:   02/07/18 1314  TempSrc:   PainSc: 8                  Yevette Edwards

## 2018-02-10 NOTE — Discharge Summary (Signed)
Kurt Horton, is a 57 y.o. male  DOB 16-Jul-1960  MRN [id number].  Admission date:  02/04/2018  Admitting Physician  Campbell Stall, MD  Discharge Date:  02/07/2018   Primary MD  [name], MD  Recommendations for primary care physician for things to follow:  Follow-up with PCP in 1 week. Dr. Maximino Greenland from the gastroenterologist in 1 week to 10 days for colonoscopy.   Admission Diagnosis  Alcohol withdrawal syndrome without complication (HCC) [F10.230]   Discharge Diagnosis  Alcohol withdrawal syndrome without complication (HCC) [F10.230]    Active Problems:   GI bleed      Past Medical History:  Diagnosis Date  . A-fib (HCC)   . Alcohol abuse   . Alcohol withdrawal (HCC) 11/12/2016  . Diabetes mellitus without complication (HCC)   . DJD (degenerative joint disease)   . Hypertension   . Myasthenia gravis (HCC)   . Renal disorder     Past Surgical History:  Procedure Laterality Date  . AMPUTATION TOE Left 12/29/2015   Procedure: AMPUTATION TOE;  Surgeon: Gwyneth Revels, DPM;  Location: [facility] ORS;  Service: Podiatry;  Laterality: Left;  . ESOPHAGOGASTRODUODENOSCOPY N/A 02/05/2018   Procedure: ESOPHAGOGASTRODUODENOSCOPY (EGD);  Surgeon: Pasty Spillers, MD;  Location: [facility] ENDOSCOPY;  Service: Endoscopy;  Laterality: N/A;  . PERIPHERAL VASCULAR CATHETERIZATION N/A 12/27/2015   Procedure: Lower Extremity Angiography;  Surgeon: Annice Needy, MD;  Location: [facility] INVASIVE CV LAB;  Service: Cardiovascular;  Laterality: N/A;  . right hip surgery    . right shoulder surgery         History of present illness and  Hospital Course:     Kindly see H&P for history of present illness and admission details, please review complete Labs, Consult reports and Test reports for all details in brief  HPI  from the  history and physical done on the day of admission 57 year old male with multiple medical problems of alcohol abuse, chronic A. fib, chronic combined systolic and diastolic heart failure, essential hypertension came in for alcohol withdrawal and developed GI bleed.   Hospital Course   #1. GI bleed: Had blood in the stool when he came.  Hemoglobin stayed stable.  IV PPIs, EGD is done by gastroenterology, EGD showed nonbleeding ulcer in the gastric antrum.  Patient never required blood transfusion.  Received IV fluids.  Hemodynamically stable.  Patient can follow-up with GI as an outpatient for colonoscopy.  ~Advised to stop aspirin, continue PPIs for at least 1 month. #3. EtOH abuse, patient has tremors on admission followed on CIWA protocol, discharged home with Ativan, advised the patient to quit drinking. 4.  History of myasthenia gravis: Patient is chronic Mycophenolate. 5.  Diabetes mellitus type 2: Continue metformin at discharge. 6.  History of chronic systolic and diastolic heart failure; appears euvolemic in the hospital. Chronic it fibrillation hold aspirin with GI bleed history, continue amiodarone, aspirin, metoprolol. #7. history of anxiety, depression patient is on BuSpar. 8.  Chronic back pain, requested high doses of narcotics. Gave limited supply of no physical rescription for narcotics advised the patient to follow-up with PCP regarding his chronic medication of the pain. Discharge Condition: Stable   Follow UP  Follow-up Information    [name], MD. Schedule an appointment as soon as possible for a visit on 02/11/2018.   Specialty:  Internal Medicine Why:  Appointment Time: @ 3:00pm Contact information: [address] [place] [address] [phone]  Pasty Spillers, MD. Schedule an appointment as soon as possible for a visit on 02/19/2018.   Specialty:  Gastroenterology Why:  Appointment Time: @ 9:00 Contact  information: [address] [place] [address] [phone]             Discharge Instructions  and  Discharge Medications     Discharge Instructions    Face-to-face encounter (required for Medicare/Medicaid patients)   Complete by:  As directed    I Katha Hamming certify that this patient is under my care and that I, or a nurse practitioner or physician's assistant working with me, had a face-to-face encounter that meets the physician face-to-face encounter requirements with this patient on 02/07/2018. The encounter with the patient was in whole, or in part for the following medical condition(s) which is the primary reason for home health care Alcohol withdrawal Acute gastritis , Chronic back pain anxiety Chronic A. fib   The encounter with the patient was in whole, or in part, for the following medical condition, which is the primary reason for home health care:  Whole   I certify that, based on my findings, the following services are medically necessary home health services:  Physical therapy   Reason for Medically Necessary Home Health Services:  Skilled Nursing- Teaching of Disease Process/Symptom Management   My clinical findings support the need for the above services:  Unable to leave home safely without assistance and/or assistive device   Further, I certify that my clinical findings support that this patient is homebound due to:  Unable to leave home safely without assistance   Home Health   Complete by:  As directed    To provide the following care/treatments:   PT OT RN Home Health Aide Social work       Allergies as of 02/07/2018      Reactions   Nsaids Other (See Comments)   Duodenal ulcers, GI bleeding   Other Other (See Comments)   Patient states he can't take any "mycin" medications because myasthenia gravis.      Medication List    STOP taking these medications   aspirin 325 MG EC tablet     TAKE these medications   amiodarone 200 MG  tablet Commonly known as:  PACERONE Take 1 tablet (200 mg total) by mouth daily.   B-complex with vitamin C tablet Take 1 tablet by mouth daily. Notes to patient:  Resume as normal   bisacodyl 5 MG EC tablet Commonly known as:  DULCOLAX Take 1 tablet (5 mg total) by mouth daily as needed for moderate constipation. Notes to patient:  Take as needed   busPIRone 15 MG tablet Commonly known as:  BUSPAR Take 15 mg by mouth 2 (two) times daily.   citalopram 20 MG tablet Commonly known as:  CELEXA Take 1 tablet (20 mg total) by mouth daily.   diltiazem 180 MG 24 hr capsule Commonly known as:  CARDIZEM CD Take 1 capsule (180 mg total) by mouth daily.   folic acid 1 MG tablet Commonly known as:  FOLVITE Take 1 tablet (1 mg total) by mouth daily.   gabapentin 300 MG capsule Commonly known as:  NEURONTIN Take 2 capsules (600 mg total) by mouth 3 (three) times daily.   LORazepam 1 MG tablet Commonly known as:  ATIVAN Take 1 tablet (1 mg total) by mouth every 8 (eight) hours as needed for anxiety.   metFORMIN 500 MG tablet Commonly known as:  GLUCOPHAGE Take 1 tablet (  500 mg total) by mouth daily. Notes to patient:  Resume as normal   metoprolol succinate 50 MG 24 hr tablet Commonly known as:  TOPROL-XL Take 1 tablet (50 mg total) by mouth daily. Take with or immediately following a meal.   multivitamin Tabs tablet Take 1 tablet by mouth daily. Notes to patient:  Resume as normal   mycophenolate 500 MG tablet Commonly known as:  CELLCEPT Take 1 tablet (500 mg total) by mouth 2 (two) times daily. Take 1 tablet (500MG ) by mouth every morning and 2 tablets (1000MG ) by mouth every evening What changed:    how much to take  when to take this  additional instructions   oxyCODONE 5 MG immediate release tablet Commonly known as:  Oxy IR/ROXICODONE Take 1-2 tablets (5-10 mg total) by mouth every 4 (four) hours as needed for moderate pain or severe pain. What changed:     medication strength  how much to take  when to take this  reasons to take this   potassium chloride 10 MEQ tablet Commonly known as:  K-DUR,KLOR-CON Take 10 mEq by mouth daily. Notes to patient:  Resume as normal   senna-docusate 8.6-50 MG tablet Commonly known as:  Senokot-S Take 1 tablet by mouth at bedtime as needed for mild constipation. What changed:  when to take this Notes to patient:  Take as needed   testosterone cypionate 200 MG/ML injection Commonly known as:  DEPOTESTOSTERONE CYPIONATE Inject 200 mg into the muscle every 14 (fourteen) days.   thiamine 100 MG tablet Take 1 tablet (100 mg total) by mouth daily.         Diet and Activity recommendation: See Discharge Instructions above   Consults obtained ' gastroenterology   Major procedures and Radiology Reports - PLEASE review detailed and final reports for all details, in brief -     Ct Head Wo Contrast  Result Date: 01/19/2018 CLINICAL DATA:  Found down, possible head trauma, ETOH EXAM: CT HEAD WITHOUT CONTRAST CT CERVICAL SPINE WITHOUT CONTRAST TECHNIQUE: Multidetector CT imaging of the head and cervical spine was performed following the standard protocol without intravenous contrast. Multiplanar CT image reconstructions of the cervical spine were also generated. COMPARISON:  CT head dated 08/17/2017 FINDINGS: CT HEAD FINDINGS Brain: No evidence of acute infarction, hemorrhage, hydrocephalus, extra-axial collection or mass lesion/mass effect. Mild cortical atrophy. Vascular: No hyperdense vessel or unexpected calcification. Skull: Normal. Negative for fracture or focal lesion. Sinuses/Orbits: Partial opacification of the right ethmoid sinuses. Mastoid air cells are clear. Other: None. CT CERVICAL SPINE FINDINGS Alignment: Reversal of the normal cervical lordosis. Skull base and vertebrae: No acute fracture. No primary bone lesion or focal pathologic process. Soft tissues and spinal canal: No prevertebral  fluid or swelling. No visible canal hematoma. Disc levels: Vertebral body heights are maintained. Mild multilevel degenerative changes. Spinal canal is patent. 4 mm anterolisthesis of C3 on C4.  5 mm anterolisthesis of C4 on C5. Upper chest: Visualized lung apices are clear. Other: Visualized thyroid is unremarkable. IMPRESSION: No evidence of acute intracranial abnormality. Mild cortical atrophy. No evidence of traumatic injury to the cervical spine. Mild multilevel degenerative changes. Anterolisthesis of C3 on C4 and C4 on C5, as above. Electronically Signed   By: Charline Bills M.D.   On: 01/19/2018 13:49   Ct Chest Wo Contrast  Result Date: 01/19/2018 CLINICAL DATA:  Found down, possible head injury.  ETOH. EXAM: CT CHEST WITHOUT CONTRAST TECHNIQUE: Multidetector CT imaging of the chest was performed following  the standard protocol without IV contrast. COMPARISON:  None. FINDINGS: Cardiovascular: Heart size is within normal limits. No pericardial effusion. No thoracic aortic aneurysm. Mediastinum/Nodes: No hemorrhage or edema within the mediastinum. No mass or enlarged lymph nodes seen within the mediastinum or perihilar regions. Lungs/Pleura: Irregular nodular density is seen within the LEFT lower lobe, medial aspect, measuring approximately 1.3 cm greatest dimension (series 3, image 104). Lungs otherwise clear. No pleural effusion or pneumothorax. Upper Abdomen: Liver is diffusely low in density, indicating fatty infiltration. No acute findings. Musculoskeletal: No acute appearing osseous abnormality. Degenerative spondylitic changes scattered throughout the slightly scoliotic thoracic spine, mild to moderate in degree. IMPRESSION: 1. No acute/traumatic findings. 2. Single irregular nodular density within the LEFT lower lobe, measuring 1.3 cm greatest dimension. Suspect nodular atelectasis. Consider one of the following in 3 months for both low-risk and high-risk individuals: (a) repeat chest CT, (b)  follow-up PET-CT, or (c) tissue sampling. This recommendation follows the consensus statement: Guidelines for Management of Incidental Pulmonary Nodules Detected on CT Images: From the Fleischner Society 2017; Radiology 2017; 284:228-243. 3. Lungs otherwise clear. No pneumonia or pulmonary edema. No pleural effusion or pneumothorax. 4. Fatty infiltration of the liver. Electronically Signed   By: Bary Richard M.D.   On: 01/19/2018 13:53   Ct Cervical Spine Wo Contrast  Result Date: 01/19/2018 CLINICAL DATA:  Found down, possible head trauma, ETOH EXAM: CT HEAD WITHOUT CONTRAST CT CERVICAL SPINE WITHOUT CONTRAST TECHNIQUE: Multidetector CT imaging of the head and cervical spine was performed following the standard protocol without intravenous contrast. Multiplanar CT image reconstructions of the cervical spine were also generated. COMPARISON:  CT head dated 08/17/2017 FINDINGS: CT HEAD FINDINGS Brain: No evidence of acute infarction, hemorrhage, hydrocephalus, extra-axial collection or mass lesion/mass effect. Mild cortical atrophy. Vascular: No hyperdense vessel or unexpected calcification. Skull: Normal. Negative for fracture or focal lesion. Sinuses/Orbits: Partial opacification of the right ethmoid sinuses. Mastoid air cells are clear. Other: None. CT CERVICAL SPINE FINDINGS Alignment: Reversal of the normal cervical lordosis. Skull base and vertebrae: No acute fracture. No primary bone lesion or focal pathologic process. Soft tissues and spinal canal: No prevertebral fluid or swelling. No visible canal hematoma. Disc levels: Vertebral body heights are maintained. Mild multilevel degenerative changes. Spinal canal is patent. 4 mm anterolisthesis of C3 on C4.  5 mm anterolisthesis of C4 on C5. Upper chest: Visualized lung apices are clear. Other: Visualized thyroid is unremarkable. IMPRESSION: No evidence of acute intracranial abnormality. Mild cortical atrophy. No evidence of traumatic injury to the cervical  spine. Mild multilevel degenerative changes. Anterolisthesis of C3 on C4 and C4 on C5, as above. Electronically Signed   By: Charline Bills M.D.   On: 01/19/2018 13:49   Portable Chest Xray  Result Date: 01/21/2018 CLINICAL DATA:  Respiratory failure EXAM: PORTABLE CHEST 1 VIEW COMPARISON:  Yesterday FINDINGS: Interval extubation with improved lung volumes. No opacity or effusion. Borderline heart size with stable mediastinal contours. Scoliosis IMPRESSION: Improved aeration.  No evidence of active disease. Electronically Signed   By: Marnee Spring M.D.   On: 01/21/2018 07:14   Dg Chest Port 1 View  Result Date: 01/20/2018 CLINICAL DATA:  Intubation. EXAM: PORTABLE CHEST 1 VIEW COMPARISON:  Chest CT yesterday. FINDINGS: Endotracheal tube tip at the thoracic inlet. Enteric tube in place with tip and side-port below the diaphragm not included in the field of view. Lung volumes are low. Mild bibasilar atelectasis. Heart size upper normal likely accentuated by technique. No confluent airspace  disease, pulmonary edema, large pleural effusion or pneumothorax. IMPRESSION: 1. Endotracheal tube tip at the thoracic inlet. Enteric tube in place with tip and side-port below the diaphragm not included in the field of view. 2. Low lung volumes with bibasilar atelectasis. Electronically Signed   By: Narda Rutherford M.D.   On: 01/20/2018 01:50   Dg Shoulder Left  Result Date: 01/19/2018 CLINICAL DATA:  Larey Seat today and injured left shoulder. EXAM: LEFT SHOULDER - 2+ VIEW COMPARISON:  None. FINDINGS: The joint spaces are maintained. No acute bony findings or bone lesion. No abnormal soft tissue calcifications. The visualized lung is clear and the visualized ribs are intact. IMPRESSION: No fracture or dislocation. Electronically Signed   By: Rudie Meyer M.D.   On: 01/19/2018 15:45    Micro Results   No results found for this or any previous visit (from the past 240 hour(s)).     Today   Subjective:    Kurt Horton today has no headache,no chest abdominal pain,no new weakness tingling or numbness, feels much better wants to go home today.  Objective:   Blood pressure 121/68, pulse 75, temperature 98.2 F (36.8 C), temperature source Oral, resp. rate 18, height 5\' 11"  (1.803 m), weight 83 kg, SpO2 95 %.  No intake or output data in the 24 hours ending 02/10/18 1806  Exam Awake Alert, Oriented x 3, No new F.N deficits, Normal affect [place].AT,PERRAL Supple Neck,No JVD, No cervical lymphadenopathy appriciated.  Symmetrical Chest wall movement, Good air movement bilaterally, CTAB RRR,No Gallops,Rubs or new Murmurs, No Parasternal Heave +ve B.Sounds, Abd Soft, Non tender, No organomegaly appriciated, No rebound -guarding or rigidity. No Cyanosis, Clubbing or edema, No new Rash or bruise  Data Review   CBC w Diff:  Lab Results  Component Value Date   WBC 7.1 02/06/2018   HGB 11.9 (L) 02/06/2018   HGB 12.8 (L) 08/24/2016   HCT 36.5 (L) 02/06/2018   HCT 38.0 08/24/2016   PLT 325 02/06/2018   PLT 205 08/24/2016   LYMPHOPCT 11 02/04/2018   BANDSPCT 1 09/30/2017   MONOPCT 15 02/04/2018   EOSPCT 0 02/04/2018   BASOPCT 1 02/04/2018    CMP:  Lab Results  Component Value Date   NA 136 02/06/2018   K 3.9 02/06/2018   CL 104 02/06/2018   CO2 23 02/06/2018   BUN 10 02/06/2018   CREATININE 0.89 02/06/2018   PROT 6.6 02/04/2018   PROT 6.2 08/24/2016   ALBUMIN 3.7 02/04/2018   ALBUMIN 4.2 08/24/2016   BILITOT 0.6 02/04/2018   BILITOT <0.2 08/24/2016   ALKPHOS 85 02/04/2018   AST 59 (H) 02/04/2018   ALT 35 02/04/2018  .   Total Time in preparing paper work, data evaluation and todays exam - 35 minutes  Katha Hamming M.D on 02/07/2018 at 6:06 PM    Note: This dictation was prepared with Dragon dictation along with smaller phrase technology. Any transcriptional errors that result from this process are unintentional.

## 2018-02-19 ENCOUNTER — Ambulatory Visit: Payer: Self-pay | Admitting: Gastroenterology

## 2018-02-19 ENCOUNTER — Encounter: Payer: Self-pay | Admitting: Gastroenterology

## 2018-02-19 DIAGNOSIS — F10230 Alcohol dependence with withdrawal, uncomplicated: Secondary | ICD-10-CM

## 2018-04-01 ENCOUNTER — Inpatient Hospital Stay
Admission: EM | Admit: 2018-04-01 | Discharge: 2018-04-10 | DRG: 896 | Disposition: A | Discharge status: Home Health | Payer: Medicaid Other | Attending: Internal Medicine | Admitting: Internal Medicine

## 2018-04-01 ENCOUNTER — Encounter: Payer: Self-pay | Admitting: Emergency Medicine

## 2018-04-01 DIAGNOSIS — F10239 Alcohol dependence with withdrawal, unspecified: Secondary | ICD-10-CM | POA: Diagnosis present

## 2018-04-01 DIAGNOSIS — M549 Dorsalgia, unspecified: Secondary | ICD-10-CM

## 2018-04-01 DIAGNOSIS — Z886 Allergy status to analgesic agent status: Secondary | ICD-10-CM

## 2018-04-01 DIAGNOSIS — F1123 Opioid dependence with withdrawal: Secondary | ICD-10-CM | POA: Diagnosis present

## 2018-04-01 DIAGNOSIS — I480 Paroxysmal atrial fibrillation: Secondary | ICD-10-CM | POA: Diagnosis present

## 2018-04-01 DIAGNOSIS — K769 Liver disease, unspecified: Secondary | ICD-10-CM | POA: Diagnosis present

## 2018-04-01 DIAGNOSIS — F10920 Alcohol use, unspecified with intoxication, uncomplicated: Secondary | ICD-10-CM

## 2018-04-01 DIAGNOSIS — F419 Anxiety disorder, unspecified: Secondary | ICD-10-CM | POA: Diagnosis present

## 2018-04-01 DIAGNOSIS — E1165 Type 2 diabetes mellitus with hyperglycemia: Secondary | ICD-10-CM | POA: Diagnosis present

## 2018-04-01 DIAGNOSIS — F10229 Alcohol dependence with intoxication, unspecified: Principal | ICD-10-CM | POA: Diagnosis present

## 2018-04-01 DIAGNOSIS — F10939 Alcohol use, unspecified with withdrawal, unspecified: Secondary | ICD-10-CM | POA: Diagnosis present

## 2018-04-01 DIAGNOSIS — F10231 Alcohol dependence with withdrawal delirium: Secondary | ICD-10-CM | POA: Diagnosis present

## 2018-04-01 DIAGNOSIS — R740 Nonspecific elevation of levels of transaminase and lactic acid dehydrogenase [LDH]: Secondary | ICD-10-CM | POA: Diagnosis present

## 2018-04-01 DIAGNOSIS — F10931 Alcohol use, unspecified with withdrawal delirium: Secondary | ICD-10-CM

## 2018-04-01 DIAGNOSIS — M545 Low back pain: Secondary | ICD-10-CM | POA: Diagnosis present

## 2018-04-01 DIAGNOSIS — F1193 Opioid use, unspecified with withdrawal: Secondary | ICD-10-CM

## 2018-04-01 DIAGNOSIS — E876 Hypokalemia: Secondary | ICD-10-CM | POA: Diagnosis present

## 2018-04-01 DIAGNOSIS — Z7984 Long term (current) use of oral hypoglycemic drugs: Secondary | ICD-10-CM

## 2018-04-01 DIAGNOSIS — F1020 Alcohol dependence, uncomplicated: Secondary | ICD-10-CM | POA: Diagnosis present

## 2018-04-01 DIAGNOSIS — G9341 Metabolic encephalopathy: Secondary | ICD-10-CM | POA: Diagnosis not present

## 2018-04-01 DIAGNOSIS — G7000 Myasthenia gravis without (acute) exacerbation: Secondary | ICD-10-CM | POA: Diagnosis present

## 2018-04-01 DIAGNOSIS — I959 Hypotension, unspecified: Secondary | ICD-10-CM | POA: Diagnosis not present

## 2018-04-01 DIAGNOSIS — Z89422 Acquired absence of other left toe(s): Secondary | ICD-10-CM

## 2018-04-01 DIAGNOSIS — F329 Major depressive disorder, single episode, unspecified: Secondary | ICD-10-CM | POA: Diagnosis present

## 2018-04-01 DIAGNOSIS — Z79899 Other long term (current) drug therapy: Secondary | ICD-10-CM

## 2018-04-01 DIAGNOSIS — I10 Essential (primary) hypertension: Secondary | ICD-10-CM | POA: Diagnosis present

## 2018-04-01 DIAGNOSIS — Z888 Allergy status to other drugs, medicaments and biological substances status: Secondary | ICD-10-CM

## 2018-04-01 DIAGNOSIS — Y908 Blood alcohol level of 240 mg/100 ml or more: Secondary | ICD-10-CM | POA: Diagnosis present

## 2018-04-01 DIAGNOSIS — I482 Chronic atrial fibrillation, unspecified: Secondary | ICD-10-CM | POA: Diagnosis present

## 2018-04-01 DIAGNOSIS — Z7982 Long term (current) use of aspirin: Secondary | ICD-10-CM

## 2018-04-01 DIAGNOSIS — G8929 Other chronic pain: Secondary | ICD-10-CM

## 2018-04-01 DIAGNOSIS — D6959 Other secondary thrombocytopenia: Secondary | ICD-10-CM | POA: Diagnosis present

## 2018-04-01 LAB — CBC
HEMATOCRIT: 41.9 % (ref 39.0–52.0)
HEMOGLOBIN: 13.3 g/dL (ref 13.0–17.0)
MCH: 26.5 pg (ref 26.0–34.0)
MCHC: 31.7 g/dL (ref 30.0–36.0)
MCV: 83.6 fL (ref 80.0–100.0)
NRBC: 0 % (ref 0.0–0.2)
PLATELETS: 99 10*3/uL — AB (ref 150–400)
RBC: 5.01 MIL/uL (ref 4.22–5.81)
RDW: 22.4 % — ABNORMAL HIGH (ref 11.5–15.5)
WBC: 4.3 10*3/uL (ref 4.0–10.5)

## 2018-04-01 LAB — COMPREHENSIVE METABOLIC PANEL
ALBUMIN: 4.4 g/dL (ref 3.5–5.0)
ALT: 24 U/L (ref 0–44)
AST: 56 U/L — AB (ref 15–41)
Alkaline Phosphatase: 78 U/L (ref 38–126)
Anion gap: 14 (ref 5–15)
BILIRUBIN TOTAL: 0.8 mg/dL (ref 0.3–1.2)
BUN: 10 mg/dL (ref 6–20)
CHLORIDE: 98 mmol/L (ref 98–111)
CO2: 29 mmol/L (ref 22–32)
Calcium: 9.1 mg/dL (ref 8.9–10.3)
Creatinine, Ser: 0.77 mg/dL (ref 0.61–1.24)
GFR calc Af Amer: >60 mL/min (ref >60)
GFR calc non Af Amer: >60 mL/min (ref >60)
GLUCOSE: 154 mg/dL — AB (ref 70–99)
POTASSIUM: 3.4 mmol/L — AB (ref 3.5–5.1)
Sodium: 141 mmol/L (ref 135–145)
TOTAL PROTEIN: 7.4 g/dL (ref 6.5–8.1)

## 2018-04-01 LAB — ETHANOL: Alcohol, Ethyl (B): 304 mg/dL (ref <10)

## 2018-04-01 LAB — SALICYLATE LEVEL: Salicylate Lvl: <7 mg/dL (ref 2.8–30.0)

## 2018-04-01 LAB — ACETAMINOPHEN LEVEL: Acetaminophen (Tylenol), Serum: <10 ug/mL — ABNORMAL LOW (ref 10–30)

## 2018-04-01 MED ORDER — VITAMIN B-1 100 MG PO TABS
100.0000 mg | ORAL_TABLET | Freq: Every day | ORAL | Status: DC
  Administered 2018-04-02: 100 mg via ORAL
  Filled 2018-04-01: qty 1

## 2018-04-01 MED ORDER — LORAZEPAM 2 MG PO TABS: 0.0000 mg | ORAL_TABLET | Freq: Two times a day (BID) | ORAL | Status: DC

## 2018-04-01 MED ORDER — LORAZEPAM 2 MG PO TABS
2.0000 mg | ORAL_TABLET | Freq: Once | ORAL | Status: AC
  Administered 2018-04-01: 2 mg via ORAL
  Filled 2018-04-01: qty 1

## 2018-04-01 MED ORDER — LORAZEPAM 2 MG/ML IJ SOLN
0.0000 mg | Freq: Four times a day (QID) | INTRAMUSCULAR | Status: DC
  Filled 2018-04-01: qty 1

## 2018-04-01 MED ORDER — LORAZEPAM 2 MG PO TABS
0.0000 mg | ORAL_TABLET | Freq: Four times a day (QID) | ORAL | Status: DC
  Administered 2018-04-01 – 2018-04-02 (×4): 2 mg via ORAL
  Administered 2018-04-02: 1 mg via ORAL
  Administered 2018-04-03: 2 mg via ORAL
  Filled 2018-04-01 (×6): qty 1

## 2018-04-01 MED ORDER — THIAMINE HCL 100 MG/ML IJ SOLN: 100.0000 mg | Freq: Every day | INTRAMUSCULAR | Status: DC

## 2018-04-01 MED ORDER — LORAZEPAM 2 MG/ML IJ SOLN: 0.0000 mg | Freq: Two times a day (BID) | INTRAMUSCULAR | Status: DC

## 2018-04-01 NOTE — ED Notes (Signed)
Hourly rounding reveals patient in room. No complaints, stable, in no acute distress. Q15 minute rounds and monitoring via Rover and Officer to continue.   

## 2018-04-01 NOTE — ED Notes (Signed)
Pt dressed out in burgundy scrubs by myself and officer Wilson.  Pt very lethargic and needed help changing.  Pt had dried stool in his pants and over skin.  Wipes given to pt to clean self. Asked when it happened and he said "a few days ago because he was drunk and he didn't feel like cleaning himself up".  Pt states "he is out of his Oxy's and needs some for his pain".  Pts belongings, which include cane, black coat, brown shoes, silver colored watch (that was placed in his shoe), yellow colored ring, silver colored ring, shirt, stool covered pants, black billfold with random cards and a 5.00 bill.  Belongings bagged, labeled and placed at nurses station.

## 2018-04-01 NOTE — ED Provider Notes (Signed)
[facility] Emergency Department Provider Note  ___________________________________________   First MD Initiated Contact with Patient 04/01/18 1941     (approximate)  I have reviewed the triage vital signs and the nursing notes.   HISTORY  Chief Complaint Alcohol Intoxication   HPI Kurt Horton is a 57 y.o. male with a history of atrial fibrillation as well as alcohol abuse was presented to the emergency department complaining of neck and back pain.  EMS reported that he was presenting for alcohol intoxication.  Patient says that he has had chronic neck and back pain over the past 2 years and that it is unchanged.  Not reporting any recent injury.  EMS reported that there were approximately 70 small, shot sized, bottles of liquor in his home.  Patient denies any suicidal or homicidal ideation.   Past Medical History:  Diagnosis Date  . A-fib (HCC)   . Alcohol abuse   . Alcohol withdrawal (HCC) 11/12/2016  . Diabetes mellitus without complication (HCC)   . DJD (degenerative joint disease)   . Hypertension   . Myasthenia gravis (HCC)   . Renal disorder     Patient Active Problem List   Diagnosis Date Noted  . GI bleed 02/04/2018  . Alcohol intoxication (HCC) 12/22/2017  . Malnutrition of moderate degree 10/04/2017  . Chronic combined systolic and diastolic CHF (congestive heart failure) (HCC) 08/13/2017  . Alcohol withdrawal delirium (HCC) 02/26/2017  . Atrial fibrillation with RVR (HCC) 02/25/2017  . Acute alcoholic intoxication with complication (HCC)   . Alcohol abuse   . Alcohol withdrawal (HCC) 11/13/2016  . Substance induced mood disorder (HCC) 11/13/2016  . SVT (supraventricular tachycardia) (HCC) 11/12/2016  . HTN (hypertension) 11/12/2016  . EtOH dependence (HCC) 11/12/2016  . Lumbar radiculopathy 08/24/2016  . Depression with anxiety 08/24/2016  . Chronic low back pain 01/04/2016  . Pressure ulcer 12/29/2015  . Osteomyelitis (HCC)  12/26/2015  . PVD (peripheral vascular disease) (HCC) 12/26/2015  . Type II diabetes mellitus with manifestations (HCC) 12/26/2015  . Myasthenia gravis (HCC) 12/26/2015    Past Surgical History:  Procedure Laterality Date  . AMPUTATION TOE Left 12/29/2015   Procedure: AMPUTATION TOE;  Surgeon: Gwyneth Revels, DPM;  Location: [facility] ORS;  Service: Podiatry;  Laterality: Left;  . ESOPHAGOGASTRODUODENOSCOPY N/A 02/05/2018   Procedure: ESOPHAGOGASTRODUODENOSCOPY (EGD);  Surgeon: Pasty Spillers, MD;  Location: [facility] ENDOSCOPY;  Service: Endoscopy;  Laterality: N/A;  . PERIPHERAL VASCULAR CATHETERIZATION N/A 12/27/2015   Procedure: Lower Extremity Angiography;  Surgeon: Annice Needy, MD;  Location: [facility] INVASIVE CV LAB;  Service: Cardiovascular;  Laterality: N/A;  . right hip surgery    . right shoulder surgery      Prior to Admission medications   Medication Sig Start Date End Date Taking? Authorizing Provider  amiodarone (PACERONE) 200 MG tablet Take 1 tablet (200 mg total) by mouth daily. 11/02/17  Yes Wieting, Richard, MD  aspirin EC 325 MG tablet Take 325 mg by mouth daily.   Yes [provider]  B Complex-C (B-COMPLEX WITH VITAMIN C) tablet Take 1 tablet by mouth daily. 11/02/17  Yes Wieting, Richard, MD  busPIRone (BUSPAR) 15 MG tablet Take 15 mg by mouth 2 (two) times daily.   Yes [provider]  folic acid (FOLVITE) 1 MG tablet Take 1 tablet (1 mg total) by mouth daily. 12/26/17  Yes Mayo, Allyn Kenner, MD  furosemide (LASIX) 40 MG tablet Take 40 mg by mouth daily.   Yes [provider]  gabapentin (NEURONTIN) 300 MG capsule Take 2 capsules (600 mg total) by mouth 3 (three) times daily. 11/02/17  Yes Wieting, Richard, MD  lisinopril (PRINIVIL,ZESTRIL) 10 MG tablet Take 10 mg by mouth daily.   Yes [provider]  metFORMIN (GLUCOPHAGE) 500 MG tablet Take 1 tablet (500 mg total) by mouth daily. 11/02/17  Yes Wieting, Richard, MD  metoprolol succinate  (TOPROL-XL) 50 MG 24 hr tablet Take 1 tablet (50 mg total) by mouth daily. Take with or immediately following a meal. 11/02/17  Yes Wieting, Richard, MD  multivitamin (ONE-A-DAY MEN'S) TABS tablet Take 1 tablet by mouth daily. 11/02/17  Yes Wieting, Richard, MD  mycophenolate (CELLCEPT) 500 MG tablet Take 1 tablet (500 mg total) by mouth 2 (two) times daily. Take 1 tablet (500MG ) by mouth every morning and 2 tablets (1000MG ) by mouth every evening Patient taking differently: Take 500-1,000 mg by mouth See admin instructions. 500 mg every morning and 1000 mg at bedtime 11/02/17  Yes Wieting, Richard, MD  oxyCODONE (OXY IR/ROXICODONE) 5 MG immediate release tablet Take 1-2 tablets (5-10 mg total) by mouth every 4 (four) hours as needed for moderate pain or severe pain. Patient taking differently: Take 10 mg by mouth daily as needed for moderate pain or severe pain.  02/07/18  Yes Katha HammingKonidena, Snehalatha, MD  potassium chloride (K-DUR,KLOR-CON) 10 MEQ tablet Take 10 mEq by mouth daily.   Yes [provider]  senna-docusate (SENOKOT-S) 8.6-50 MG tablet Take 1 tablet by mouth at bedtime as needed for mild constipation. Patient taking differently: Take 1 tablet by mouth daily.  12/25/17  Yes Mayo, Allyn KennerKaty Dodd, MD  testosterone cypionate (DEPOTESTOSTERONE CYPIONATE) 200 MG/ML injection Inject 200 mg into the muscle every 14 (fourteen) days.   Yes [provider]  thiamine 100 MG tablet Take 1 tablet (100 mg total) by mouth daily. 11/02/17  Yes Wieting, Richard, MD  bisacodyl (DULCOLAX) 5 MG EC tablet Take 1 tablet (5 mg total) by mouth daily as needed for moderate constipation. Patient not taking: Reported on 02/04/2018 12/25/17   MayoAllyn Kenner, Katy Dodd, MD  citalopram (CELEXA) 20 MG tablet Take 1 tablet (20 mg total) by mouth daily. Patient not taking: Reported on 04/01/2018 11/02/17   Alford HighlandWieting, Richard, MD  diltiazem (CARDIZEM CD) 180 MG 24 hr capsule Take 1 capsule (180 mg total) by mouth daily. 11/02/17  03/02/18  Alford HighlandWieting, Richard, MD  LORazepam (ATIVAN) 1 MG tablet Take 1 tablet (1 mg total) by mouth every 8 (eight) hours as needed for anxiety. Patient not taking: Reported on 04/01/2018 02/07/18 02/07/19  Katha HammingKonidena, Snehalatha, MD    Allergies Nsaids and Other  Family History  Problem Relation Age of Onset  . Rheum arthritis Father     Social History Social History   Tobacco Use  . Smoking status: Current Every Day Smoker    Packs/day: 0.50    Types: Cigarettes  . Smokeless tobacco: Never Used  Substance Use Topics  . Alcohol use: Yes    Comment: Pt had tequila and rum, at least a 5th or a pint a day  . Drug use: No    Review of Systems  Constitutional: No fever/chills Eyes: No visual changes. ENT: No sore throat. Cardiovascular: Denies chest pain. Respiratory: Denies shortness of breath. Gastrointestinal: No abdominal pain.  No nausea, no vomiting.  No diarrhea.  No constipation. Genitourinary: Negative for dysuria. Musculoskeletal: Negative for back pain. Skin: Negative for rash. Neurological: Negative for headaches, focal weakness or numbness.   ____________________________________________  PHYSICAL EXAM:  VITAL SIGNS: ED Triage Vitals  Enc Vitals Group     BP 04/01/18 1946 (!) 178/110     Pulse Rate 04/01/18 1946 (!) 111     Resp 04/01/18 1946 18     Temp 04/01/18 1946 98.4 F (36.9 C)     Temp Source 04/01/18 1946 Oral     SpO2 04/01/18 1946 97 %     Weight 04/01/18 1947 185 lb (83.9 kg)     Height 04/01/18 1947 5\' 11"  (1.803 m)     Head Circumference --      Peak Flow --      Pain Score --      Pain Loc --      Pain Edu? --      Excl. in GC? --     Constitutional: Alert and oriented.  Patient appears disheveled.  Slurring speech consistent with alcohol intoxication. Eyes: Conjunctivae are normal.  Head: Atraumatic. Nose: No congestion/rhinnorhea. Mouth/Throat: Mucous membranes are moist.  Neck: No stridor.   Cardiovascular: Normal rate,  regular rhythm. Grossly normal heart sounds.   Respiratory: Normal respiratory effort.  No retractions. Lungs CTAB. Gastrointestinal: Soft and nontender. No distention.  Musculoskeletal: No lower extremity tenderness nor edema.  No joint effusions.  No tenderness to palpation in the midline cervical spine.  Nor is there any tenderness palpation of the thoracic nor lumbar spines.  No deformity or step-off. Neurologic:  No gross focal neurologic deficits are appreciated. Skin:  Skin is warm, dry and intact. No rash noted. Psychiatric: Mood and affect are normal.  ____________________________________________   LABS (all labs ordered are listed, but only abnormal results are displayed)  Labs Reviewed  CBC - Abnormal; Notable for the following components:      Result Value   RDW 22.4 (*)    Platelets 99 (*)    All other components within normal limits  ACETAMINOPHEN LEVEL - Abnormal; Notable for the following components:   Acetaminophen (Tylenol), Serum <10 (*)    All other components within normal limits  COMPREHENSIVE METABOLIC PANEL - Abnormal; Notable for the following components:   Potassium 3.4 (*)    Glucose, Bld 154 (*)    AST 56 (*)    All other components within normal limits  ETHANOL - Abnormal; Notable for the following components:   Alcohol, Ethyl (B) 304 (*)    All other components within normal limits  SALICYLATE LEVEL  URINE DRUG SCREEN, QUALITATIVE ([facility] ONLY)   ____________________________________________  EKG   ____________________________________________  RADIOLOGY   ____________________________________________   PROCEDURES  Procedure(s) performed:   Procedures  Critical Care performed:   ____________________________________________   INITIAL IMPRESSION / ASSESSMENT AND PLAN / ED COURSE  Pertinent labs & imaging results that were available during my care of the patient were reviewed by me and considered in my medical decision making (see chart  for details).  DDX: Alcohol intoxication, chronic pain As part of my medical decision making, I reviewed the following data within the electronic MEDICAL RECORD NUMBER Notes from prior ED visits  ----------------------------------------- 11:38 PM on 04/01/2018 -----------------------------------------  Patient complaining of chronic back and neck pain but clinically intoxicate.  Also with a non focal neurologic exam.  Plan will be to observe until sober to dc.  Signed out to Dr. York Cerise.   ____________________________________________   FINAL CLINICAL IMPRESSION(S) / ED DIAGNOSES  chornic back pain Alcohol intoxication   NEW MEDICATIONS STARTED DURING THIS VISIT:  New Prescriptions   No medications  on file     Note:  This document was prepared using Dragon voice recognition software and may include unintentional dictation errors.     Myrna Blazer, MD 04/01/18 [phone]

## 2018-04-01 NOTE — ED Triage Notes (Signed)
Pt arrived to ED from home where family called due to pt drinking alcohol x3 days. EMS reports pt was found in bed laying on 70 airplane bottles of bacardi rum that pt reported he had consumed over last 3 days. Pt not answering questions on SI/HI or medical hx.

## 2018-04-01 NOTE — ED Notes (Signed)
Patient alert and oriented, warm and dry, in no acute distress.He said he was found in his room in his father's house. He said he has two grown children. Patient has two superficial cut on both his wrist. He said he is in so much pain in his neck, shoulder and lower back from a fall three years ago and he doesn't want to live anymore. He asked to have pain medication and he stated he take oxycodone for his pain. He denied that he had alcohol this evening.  Patient made aware of Q15 minute rounds and Psychologist, counsellingover and Officer presence for their safety. Patient instructed to come to me with needs or concerns.

## 2018-04-02 LAB — URINE DRUG SCREEN, QUALITATIVE (ARMC ONLY)
AMPHETAMINES, UR SCREEN: NOT DETECTED
BENZODIAZEPINE, UR SCRN: POSITIVE — AB
Barbiturates, Ur Screen: NOT DETECTED
Cannabinoid 50 Ng, Ur ~~LOC~~: NOT DETECTED
Cocaine Metabolite,Ur ~~LOC~~: NOT DETECTED
MDMA (Ecstasy)Ur Screen: NOT DETECTED
METHADONE SCREEN, URINE: NOT DETECTED
Opiate, Ur Screen: NOT DETECTED
Phencyclidine (PCP) Ur S: NOT DETECTED
Tricyclic, Ur Screen: NOT DETECTED

## 2018-04-02 MED ORDER — LOPERAMIDE HCL 2 MG PO CAPS
2.0000 mg | ORAL_CAPSULE | ORAL | Status: DC | PRN
  Administered 2018-04-02 – 2018-04-05 (×3): 2 mg via ORAL
  Filled 2018-04-02 (×4): qty 1

## 2018-04-02 MED ORDER — LOPERAMIDE HCL 2 MG PO CAPS
2.0000 mg | ORAL_CAPSULE | Freq: Once | ORAL | Status: AC
  Administered 2018-04-02: 2 mg via ORAL
  Filled 2018-04-02: qty 1

## 2018-04-02 MED ORDER — LORAZEPAM 2 MG PO TABS
2.0000 mg | ORAL_TABLET | Freq: Once | ORAL | Status: AC
  Administered 2018-04-02: 2 mg via ORAL
  Filled 2018-04-02: qty 1

## 2018-04-02 MED ORDER — ACETAMINOPHEN 500 MG PO TABS
1000.0000 mg | ORAL_TABLET | Freq: Once | ORAL | Status: AC
  Administered 2018-04-02: 1000 mg via ORAL
  Filled 2018-04-02: qty 2

## 2018-04-02 NOTE — ED Notes (Signed)
SOC  DONE  REPORT  GIVEN  TO MD 

## 2018-04-02 NOTE — ED Notes (Signed)
Hourly rounding reveals patient in room. No complaints, stable, in no acute distress. Q15 minute rounds and monitoring via Rover and Officer to continue.   

## 2018-04-02 NOTE — ED Notes (Signed)
Patient talking to the SOC 

## 2018-04-02 NOTE — ED Notes (Signed)
Pt stated he needs cleaning. Pt given pants, wipes and underwear and trashcan to clean himself.

## 2018-04-02 NOTE — BH Assessment (Signed)
Referral information for Psychiatric Hospitalization faxed to;   [facility] Kitchen. [facility] GroveBrynn Mar[facility] [phone]),   . [facility] [phone]---[phone]),  . [facility] [phone], [phone], [phone] or [phone]),   . [place] [phone] or [phone]  . [facility] [phone]),   . [facility] [phone]),   . [facility] [phone] or [phone])  . [facility] [phone])  . [facility]

## 2018-04-02 NOTE — ED Notes (Signed)
SOC    CALLED 

## 2018-04-02 NOTE — ED Notes (Signed)
VOL  PENDING  PLACEMENT 

## 2018-04-02 NOTE — ED Notes (Addendum)
BEHAVIORAL HEALTH ROUNDING Patient sleeping: No. Patient alert and oriented: yes Behavior appropriate: Yes.  ; If no, describe:  Nutrition and fluids offered: yes Toileting and hygiene offered: Yes  Sitter present: q15 minute observations and security monitoring Law enforcement present: Yes    

## 2018-04-02 NOTE — BH Assessment (Signed)
This Clinical research associatewriter called [facility] to check for male bed availability.  This Clinical research associatewriter spoke to [employer] ([facility] intake staff) who confirms bed availability. He requested that pt information (EDP note and recent labs) be faxed to [phone].  With verbal consent from pt, this writer faxed pt's information to [facility] for potential detox placement.

## 2018-04-02 NOTE — ED Provider Notes (Signed)
I reevaluated the patient around noon, after being told by [facility] that patient did not qualify for [facility] due to acuity given his chronic back pain.  Patient states that yesterday he did self injure his wrists bilaterally stating that he did not feel like he could live anymore if he was going to be in chronic pain.  I am to place a tele-psychiatry consultation.  He is not reporting suicidal ideation at this moment in time.  I am remaining involuntary at this point as he is ready for additional evaluation with a psychiatrist.  Patient is currently living with his parents.  Patient states that he is in for chronic pain evaluation in a couple of weeks.  States that his primary care doctor was giving him fentanyl as well as oxycodone, and last dose was about a week ago.  States he is been having a lot of diarrhea.  I suspect this may be related to opiate withdrawal.   Governor RooksLord, Fady Stamps, MD 04/02/18 1247

## 2018-04-02 NOTE — BH Assessment (Signed)
Per Kurt Maduroobert ([facility] staff) pt has been denied by [facility] due to acuity.

## 2018-04-02 NOTE — ED Provider Notes (Addendum)
-----------------------------------------   6:24 AM on 04/02/2018 -----------------------------------------   Blood pressure (!) 158/100, pulse (!) 111, temperature 98.4 F (36.9 C), temperature source Oral, resp. rate 18, height 1.803 m (5\' 11" ), weight 83.9 kg, SpO2 97 %.  The patient is awake and alert and clinically sober.  He appears to be suffering from some alcohol withdrawal.  He is slightly tremulous but he is making sense, alert and oriented, no hallucinations.  I will give him another dose of Ativan 2 mg by mouth but the patient agrees that he is otherwise ready to go home.  He is familiar with [facility] and I am giving him information about [facility] with whom he can follow-up.  As with Dr. Pershing ProudSchaevitz, he once again ask for "something for pain" and I told him I would give him Tylenol or anything else nonnarcotic but I will not treat his chronic neck and back pain with narcotics particularly in the setting of alcohol abuse.  He states that he understands.  I encouraged him to follow-up with Dr. Marcello FennelHande (his PCP) as well as with [facility] and he agrees to do so.  No indication for further evaluation at this time.    ----------------------------------------- 6:56 AM on 04/02/2018 -----------------------------------------  The patient is essentially refusing to leave stating that he is still having withdrawals and is shaking.  He has chronic alcohol abuse and chronic pain.  He is ready to go at any time from a medical perspective, but since he is refusing to leave I have consulted TTS to evaluate him and determine if he would be appropriate for [facility] or other alcohol treatment placement.   Loleta RoseForbach, Victoriana Aziz, MD 04/02/18 [phone]

## 2018-04-02 NOTE — ED Notes (Signed)
Pt is lying in bed cleaning himself and putting underwear and clothes on.

## 2018-04-02 NOTE — ED Notes (Signed)
Patient needed a 2 man assist to go to the bathroom, patient appeared to be very jittery and shaky in nurses presence Patient had some slight diarrhea

## 2018-04-02 NOTE — ED Notes (Signed)
Pt given breakfast tray

## 2018-04-02 NOTE — ED Notes (Signed)
Patient continues calling "nurse", writer explained to patient that a lot of things he has to do for himself, patient has not been calling the nurse or technician when he has a bowel movement or urinates on himself he says "I forget" expressed to patient it will be helpful to assist him to the restroom

## 2018-04-03 ENCOUNTER — Other Ambulatory Visit: Payer: Self-pay

## 2018-04-03 ENCOUNTER — Encounter: Payer: Self-pay | Admitting: Internal Medicine

## 2018-04-03 LAB — MAGNESIUM: MAGNESIUM: 1.2 mg/dL — AB (ref 1.7–2.4)

## 2018-04-03 LAB — GLUCOSE, CAPILLARY
GLUCOSE-CAPILLARY: 40 mg/dL — AB (ref 70–99)
GLUCOSE-CAPILLARY: 117 mg/dL — AB (ref 70–99)
Glucose-Capillary: 127 mg/dL — ABNORMAL HIGH (ref 70–99)
Glucose-Capillary: 136 mg/dL — ABNORMAL HIGH (ref 70–99)

## 2018-04-03 LAB — PHOSPHORUS: PHOSPHORUS: 1.9 mg/dL — AB (ref 2.5–4.6)

## 2018-04-03 MED ORDER — LISINOPRIL 10 MG PO TABS
10.0000 mg | ORAL_TABLET | Freq: Every day | ORAL | Status: DC
  Administered 2018-04-03 – 2018-04-10 (×7): 10 mg via ORAL
  Filled 2018-04-03 (×7): qty 1

## 2018-04-03 MED ORDER — CHLORDIAZEPOXIDE HCL 25 MG PO CAPS
25.0000 mg | ORAL_CAPSULE | Freq: Four times a day (QID) | ORAL | Status: DC | PRN
  Administered 2018-04-03 – 2018-04-04 (×3): 25 mg via ORAL
  Filled 2018-04-03 (×3): qty 1

## 2018-04-03 MED ORDER — LORAZEPAM 2 MG/ML IJ SOLN: 0.0000 mg | Freq: Two times a day (BID) | INTRAMUSCULAR | Status: DC

## 2018-04-03 MED ORDER — ASPIRIN EC 325 MG PO TBEC
325.0000 mg | DELAYED_RELEASE_TABLET | Freq: Every day | ORAL | Status: DC
  Administered 2018-04-03 – 2018-04-10 (×8): 325 mg via ORAL
  Filled 2018-04-03 (×8): qty 1

## 2018-04-03 MED ORDER — BISACODYL 5 MG PO TBEC: 5.0000 mg | DELAYED_RELEASE_TABLET | Freq: Every day | ORAL | Status: DC | PRN

## 2018-04-03 MED ORDER — LORAZEPAM 2 MG/ML IJ SOLN: 1.0000 mg | Freq: Four times a day (QID) | INTRAMUSCULAR | Status: DC | PRN

## 2018-04-03 MED ORDER — THIAMINE HCL 100 MG/ML IJ SOLN: 100.0000 mg | Freq: Every day | INTRAMUSCULAR | Status: DC

## 2018-04-03 MED ORDER — BUSPIRONE HCL 15 MG PO TABS
15.0000 mg | ORAL_TABLET | Freq: Two times a day (BID) | ORAL | Status: DC
  Administered 2018-04-03 – 2018-04-10 (×15): 15 mg via ORAL
  Filled 2018-04-03 (×2): qty 1
  Filled 2018-04-03: qty 3
  Filled 2018-04-03: qty 1
  Filled 2018-04-03: qty 3
  Filled 2018-04-03 (×2): qty 1
  Filled 2018-04-03 (×4): qty 3
  Filled 2018-04-03: qty 1
  Filled 2018-04-03 (×2): qty 3
  Filled 2018-04-03: qty 1
  Filled 2018-04-03: qty 3

## 2018-04-03 MED ORDER — METOPROLOL SUCCINATE ER 50 MG PO TB24
50.0000 mg | ORAL_TABLET | Freq: Every day | ORAL | Status: DC
  Administered 2018-04-03 – 2018-04-10 (×6): 50 mg via ORAL
  Filled 2018-04-03 (×7): qty 1

## 2018-04-03 MED ORDER — POTASSIUM CHLORIDE CRYS ER 20 MEQ PO TBCR: 40.0000 meq | EXTENDED_RELEASE_TABLET | Freq: Two times a day (BID) | ORAL | Status: DC

## 2018-04-03 MED ORDER — INSULIN ASPART 100 UNIT/ML ~~LOC~~ SOLN
0.0000 [IU] | Freq: Three times a day (TID) | SUBCUTANEOUS | Status: DC
  Administered 2018-04-03 – 2018-04-04 (×3): 2 [IU] via SUBCUTANEOUS
  Administered 2018-04-05 – 2018-04-06 (×4): 3 [IU] via SUBCUTANEOUS
  Administered 2018-04-06 – 2018-04-08 (×5): 2 [IU] via SUBCUTANEOUS
  Administered 2018-04-08: 3 [IU] via SUBCUTANEOUS
  Filled 2018-04-03 (×13): qty 1

## 2018-04-03 MED ORDER — SENNOSIDES-DOCUSATE SODIUM 8.6-50 MG PO TABS: 1.0000 | ORAL_TABLET | Freq: Every evening | ORAL | Status: DC | PRN

## 2018-04-03 MED ORDER — SODIUM CHLORIDE 0.9 % IV BOLUS: 1000.0000 mL | Freq: Once | INTRAVENOUS | Status: DC

## 2018-04-03 MED ORDER — POTASSIUM CHLORIDE CRYS ER 20 MEQ PO TBCR
40.0000 meq | EXTENDED_RELEASE_TABLET | Freq: Two times a day (BID) | ORAL | Status: AC
  Administered 2018-04-03: 40 meq via ORAL
  Filled 2018-04-03: qty 2

## 2018-04-03 MED ORDER — DIPHENHYDRAMINE HCL 25 MG PO CAPS
ORAL_CAPSULE | ORAL | Status: AC
  Administered 2018-04-03: 50 mg via ORAL
  Filled 2018-04-03: qty 2

## 2018-04-03 MED ORDER — GABAPENTIN 300 MG PO CAPS
600.0000 mg | ORAL_CAPSULE | Freq: Three times a day (TID) | ORAL | Status: DC
  Administered 2018-04-03 – 2018-04-10 (×22): 600 mg via ORAL
  Filled 2018-04-03 (×22): qty 2

## 2018-04-03 MED ORDER — THIAMINE HCL 100 MG/ML IJ SOLN
Freq: Once | INTRAVENOUS | Status: AC
  Administered 2018-04-03: 07:00:00 via INTRAVENOUS
  Filled 2018-04-03: qty 1000

## 2018-04-03 MED ORDER — LORAZEPAM 2 MG/ML IJ SOLN
0.0000 mg | Freq: Four times a day (QID) | INTRAMUSCULAR | Status: DC
  Administered 2018-04-03: 4 mg via INTRAVENOUS
  Administered 2018-04-03: 2 mg via INTRAVENOUS
  Administered 2018-04-03: 4 mg via INTRAVENOUS
  Administered 2018-04-03: 2 mg via INTRAVENOUS
  Administered 2018-04-04: 1 mg via INTRAVENOUS
  Filled 2018-04-03 (×2): qty 1
  Filled 2018-04-03: qty 2
  Filled 2018-04-03 (×2): qty 1
  Filled 2018-04-03: qty 2

## 2018-04-03 MED ORDER — AMIODARONE HCL 200 MG PO TABS
200.0000 mg | ORAL_TABLET | Freq: Every day | ORAL | Status: DC
  Administered 2018-04-03 – 2018-04-10 (×8): 200 mg via ORAL
  Filled 2018-04-03 (×8): qty 1

## 2018-04-03 MED ORDER — LORAZEPAM 1 MG PO TABS
1.0000 mg | ORAL_TABLET | Freq: Four times a day (QID) | ORAL | Status: DC | PRN
  Administered 2018-04-04: 1 mg via ORAL
  Filled 2018-04-03: qty 1

## 2018-04-03 MED ORDER — DIPHENHYDRAMINE HCL 25 MG PO CAPS
50.0000 mg | ORAL_CAPSULE | Freq: Once | ORAL | Status: AC
  Administered 2018-04-03: 50 mg via ORAL

## 2018-04-03 MED ORDER — INSULIN ASPART 100 UNIT/ML ~~LOC~~ SOLN: 0.0000 [IU] | Freq: Every day | SUBCUTANEOUS | Status: DC

## 2018-04-03 MED ORDER — ONDANSETRON HCL 4 MG/2ML IJ SOLN: 4.0000 mg | Freq: Four times a day (QID) | INTRAMUSCULAR | Status: DC | PRN

## 2018-04-03 MED ORDER — ONDANSETRON HCL 4 MG PO TABS: 4.0000 mg | ORAL_TABLET | Freq: Four times a day (QID) | ORAL | Status: DC | PRN

## 2018-04-03 MED ORDER — LACTATED RINGERS IV SOLN
INTRAVENOUS | Status: AC
  Administered 2018-04-03: 07:00:00 via INTRAVENOUS

## 2018-04-03 MED ORDER — MYCOPHENOLATE MOFETIL 250 MG PO CAPS
1000.0000 mg | ORAL_CAPSULE | Freq: Every day | ORAL | Status: DC
  Administered 2018-04-03 – 2018-04-09 (×7): 1000 mg via ORAL
  Filled 2018-04-03 (×8): qty 4

## 2018-04-03 MED ORDER — ENOXAPARIN SODIUM 40 MG/0.4ML ~~LOC~~ SOLN
40.0000 mg | SUBCUTANEOUS | Status: DC
  Administered 2018-04-03 – 2018-04-09 (×7): 40 mg via SUBCUTANEOUS
  Filled 2018-04-03 (×7): qty 0.4

## 2018-04-03 MED ORDER — VITAMIN B-1 100 MG PO TABS
100.0000 mg | ORAL_TABLET | Freq: Every day | ORAL | Status: DC
  Administered 2018-04-03 – 2018-04-10 (×8): 100 mg via ORAL
  Filled 2018-04-03 (×8): qty 1

## 2018-04-03 MED ORDER — FOLIC ACID 1 MG PO TABS
1.0000 mg | ORAL_TABLET | Freq: Every day | ORAL | Status: DC
  Administered 2018-04-03 – 2018-04-10 (×8): 1 mg via ORAL
  Filled 2018-04-03 (×8): qty 1

## 2018-04-03 MED ORDER — MAGNESIUM SULFATE 4 GM/100ML IV SOLN
4.0000 g | Freq: Once | INTRAVENOUS | Status: AC
  Administered 2018-04-03: 4 g via INTRAVENOUS
  Filled 2018-04-03: qty 100

## 2018-04-03 MED ORDER — DILTIAZEM HCL ER COATED BEADS 180 MG PO CP24
180.0000 mg | ORAL_CAPSULE | Freq: Every day | ORAL | Status: DC
  Administered 2018-04-03 – 2018-04-10 (×7): 180 mg via ORAL
  Filled 2018-04-03 (×7): qty 1

## 2018-04-03 MED ORDER — MYCOPHENOLATE MOFETIL 250 MG PO CAPS
500.0000 mg | ORAL_CAPSULE | ORAL | Status: DC
  Administered 2018-04-03 – 2018-04-10 (×8): 500 mg via ORAL
  Filled 2018-04-03 (×8): qty 2

## 2018-04-03 MED ORDER — POTASSIUM PHOSPHATE MONOBASIC 500 MG PO TABS
1000.0000 mg | ORAL_TABLET | Freq: Three times a day (TID) | ORAL | Status: DC
  Administered 2018-04-03 (×3): 1000 mg via ORAL
  Filled 2018-04-03 (×4): qty 2

## 2018-04-03 MED ORDER — ADULT MULTIVITAMIN W/MINERALS CH
1.0000 | ORAL_TABLET | Freq: Every day | ORAL | Status: DC
  Administered 2018-04-03 – 2018-04-10 (×8): 1 via ORAL
  Filled 2018-04-03 (×8): qty 1

## 2018-04-03 MED ORDER — FUROSEMIDE 40 MG PO TABS
40.0000 mg | ORAL_TABLET | Freq: Every day | ORAL | Status: DC
  Administered 2018-04-03 – 2018-04-09 (×6): 40 mg via ORAL
  Filled 2018-04-03 (×7): qty 1

## 2018-04-03 MED ORDER — LORAZEPAM 2 MG/ML IJ SOLN
2.0000 mg | Freq: Once | INTRAMUSCULAR | Status: AC
  Administered 2018-04-03: 2 mg via INTRAMUSCULAR

## 2018-04-03 NOTE — Consult Note (Signed)
PHARMACY ELECTROLYTE CONSULT  Pharmacy consulted to replace electolytes in tis 57 year old male w/ ETOH abuse K=3.4 Mg=1.2 Phos=1.9  KCL 40 MEQ x 2 doses already ordered. I will change this to x 1 dose KPhos po 1000mg  (7.5MEQ KCL) QID x 4 doses Mg IV 4g once  Recheck electrolytes in the AM  Nickolus Wadding D Gwenith Tschida, Pharm.D, BCPS Clinical Pharmacist

## 2018-04-03 NOTE — ED Notes (Signed)
Noise heard and rover to room and found pt sitting on the floor. Pt co pain to his ankles and lower back. Dr Dolores FrameSung informed.

## 2018-04-03 NOTE — Progress Notes (Signed)
CIWAA protocol in progress,ativan on board

## 2018-04-03 NOTE — ED Provider Notes (Addendum)
-----------------------------------------   4:49 AM on 04/03/2018 -----------------------------------------  Over the course of my shift, since 11 PM, patient has been escalating his need for Ativan.  Currently scores high on the CIWA scale.  Hallucinating.  Patient found sitting on the floor.  Denies striking head.  Complained of bilateral ankle and lower back pain; however, he was able to ambulate with steady gait to his bed.  He is currently tachycardic and hypertensive.  Discussed with hospitalist who will evaluate patient in the emergency department for admission.   Irean HongSung, Arslan Kier J, MD 04/03/18 0507  ED ECG REPORT I, Ishan Sanroman J, the attending physician, personally viewed and interpreted this ECG.   Date: 04/03/2018  EKG Time: 0521  Rate: 110  Rhythm: sinus tachycardia  Axis: LAD  Intervals:none  ST&T Change: Nonspecific     Irean HongSung, Johnica Armwood J, MD 04/03/18 [phone]

## 2018-04-03 NOTE — Progress Notes (Signed)
Patient admitted overnight. Seen and examined by me this morning. He appears intoxicated. States he does not know if he wants to stop drinking alcohol.  On exam, he is tremulous. +slurred speech. Alert, oriented only to person and place.   -Agree with admission H&P -CIWA -Continue librium, thiamine, folate, MVI -Will obtain psychiatry consult -Can likely discharge tomorrow  Kurt Horton Mayo, MD

## 2018-04-03 NOTE — ED Notes (Signed)
Pt assisted up and to the bathroom with walker and gait belt. Pt ambulated with slow steady gait. Pt assisted back to bed after having BM. Pt tolerated well. Warm blanket given.

## 2018-04-03 NOTE — H&P (Signed)
[facility] - [place] at [facility]   PATIENT NAME: Kurt Horton    MR#:  [id number]  DATE OF BIRTH:  31-Dec-1960  DATE OF ADMISSION:  04/01/2018  PRIMARY CARE PHYSICIAN: [name], MD   REQUESTING/REFERRING PHYSICIAN: Irean Hong, MD  CHIEF COMPLAINT:   Chief Complaint  Patient presents with  . Alcohol Intoxication    HISTORY OF PRESENT ILLNESS:  Kurt Horton  is a 57 y.o. male with a known history of EtOH dependence/abuse/intoxication/withdrawal, PAF (on Amio; no AC), chronic systolic + diastolic CHF (EF 40% w/ grade 1 diastolic dysfxn as of 02/26/2017 Echo), myasthenia (CellCept), T2NIDDM, HTN p/w EtOH dependence/abuse/intoxication/withdrawal. He says he is in the hospital for "alcohol". He also endorses palpitations and chronic shoulder and back pain. He says he has been referred to a pain clinic. The hospitalist service is contacted for continued management of EtOH withdrawal. Pt was reported to be exhibiting withdrawal symptoms in ED; he is diaphoretic (but is not tremulous or tachycardic) at the time of my assessment. EtOH level 304 on ED arrival. Pt is on a CIWA protocol.  I discussed the case extensively w/ ED provider (Dr. Dolores Frame). The timing and circumstances of his hospitalization are overly convenient. There is a strong suspicion that pt knows and is intentionally misusing the system for resources. For example, he asks to be detoxified at an alcohol rehabilitation facility, when it is patently obvious he will not remain EtOH-free, and will not be accepted at a facility during a holiday weekend; I suspect this is being done in order to remain hospitalized through the long weekend.  No narcotics.  PAST MEDICAL HISTORY:   Past Medical History:  Diagnosis Date  . A-fib (HCC)   . Alcohol abuse   . Alcohol withdrawal (HCC) 11/12/2016  . Diabetes mellitus without complication (HCC)   . DJD (degenerative joint disease)   . Hypertension   . Myasthenia  gravis (HCC)   . Renal disorder     PAST SURGICAL HISTORY:   Past Surgical History:  Procedure Laterality Date  . AMPUTATION TOE Left 12/29/2015   Procedure: AMPUTATION TOE;  Surgeon: Gwyneth Revels, DPM;  Location: [facility] ORS;  Service: Podiatry;  Laterality: Left;  . ESOPHAGOGASTRODUODENOSCOPY N/A 02/05/2018   Procedure: ESOPHAGOGASTRODUODENOSCOPY (EGD);  Surgeon: Pasty Spillers, MD;  Location: [facility] ENDOSCOPY;  Service: Endoscopy;  Laterality: N/A;  . PERIPHERAL VASCULAR CATHETERIZATION N/A 12/27/2015   Procedure: Lower Extremity Angiography;  Surgeon: Annice Needy, MD;  Location: [facility] INVASIVE CV LAB;  Service: Cardiovascular;  Laterality: N/A;  . right hip surgery    . right shoulder surgery      SOCIAL HISTORY:   Social History   Tobacco Use  . Smoking status: Current Every Day Smoker    Packs/day: 0.50    Types: Cigarettes  . Smokeless tobacco: Never Used  Substance Use Topics  . Alcohol use: Yes    Comment: Pt had tequila and rum, at least a 5th or a pint a day    FAMILY HISTORY:   Family History  Problem Relation Age of Onset  . Rheum arthritis Father     DRUG ALLERGIES:   Allergies  Allergen Reactions  . Nsaids Other (See Comments)    Duodenal ulcers, GI bleeding  . Other Other (See Comments)    Patient states he can't take any "mycin" medications because myasthenia gravis.    REVIEW OF SYSTEMS:   Review of Systems  Constitutional: Positive for diaphoresis. Negative for chills, fever,  malaise/fatigue and weight loss.  HENT: Negative for congestion, ear pain, hearing loss, nosebleeds, sinus pain, sore throat and tinnitus.   Eyes: Negative for blurred vision, double vision, photophobia, pain, discharge and redness.  Respiratory: Negative for cough, hemoptysis, sputum production, shortness of breath and wheezing.   Cardiovascular: Positive for palpitations. Negative for chest pain, orthopnea, claudication, leg swelling and PND.  Gastrointestinal: Negative  for abdominal pain, blood in stool, constipation, diarrhea, heartburn, melena, nausea and vomiting.  Genitourinary: Negative for dysuria, flank pain, frequency, hematuria and urgency.  Musculoskeletal: Positive for back pain and joint pain. Negative for falls, myalgias and neck pain.  Skin: Negative for itching and rash.  Neurological: Positive for tremors. Negative for dizziness, tingling, sensory change, speech change, focal weakness, seizures, loss of consciousness, weakness and headaches.  Psychiatric/Behavioral: Negative for memory loss. The patient does not have insomnia.    MEDICATIONS AT HOME:   Prior to Admission medications   Medication Sig Start Date End Date Taking? Authorizing Provider  amiodarone (PACERONE) 200 MG tablet Take 1 tablet (200 mg total) by mouth daily. 11/02/17  Yes Wieting, Richard, MD  aspirin EC 325 MG tablet Take 325 mg by mouth daily.   Yes [provider]  B Complex-C (B-COMPLEX WITH VITAMIN C) tablet Take 1 tablet by mouth daily. 11/02/17  Yes Wieting, Richard, MD  busPIRone (BUSPAR) 15 MG tablet Take 15 mg by mouth 2 (two) times daily.   Yes [provider]  folic acid (FOLVITE) 1 MG tablet Take 1 tablet (1 mg total) by mouth daily. 12/26/17  Yes Mayo, Allyn KennerKaty Dodd, MD  furosemide (LASIX) 40 MG tablet Take 40 mg by mouth daily.   Yes [provider]  gabapentin (NEURONTIN) 300 MG capsule Take 2 capsules (600 mg total) by mouth 3 (three) times daily. 11/02/17  Yes Wieting, Richard, MD  lisinopril (PRINIVIL,ZESTRIL) 10 MG tablet Take 10 mg by mouth daily.   Yes [provider]  metFORMIN (GLUCOPHAGE) 500 MG tablet Take 1 tablet (500 mg total) by mouth daily. 11/02/17  Yes Wieting, Richard, MD  metoprolol succinate (TOPROL-XL) 50 MG 24 hr tablet Take 1 tablet (50 mg total) by mouth daily. Take with or immediately following a meal. 11/02/17  Yes Wieting, Richard, MD  multivitamin (ONE-A-DAY MEN'S) TABS tablet Take 1 tablet by mouth daily.  11/02/17  Yes Wieting, Richard, MD  mycophenolate (CELLCEPT) 500 MG tablet Take 1 tablet (500 mg total) by mouth 2 (two) times daily. Take 1 tablet (500MG ) by mouth every morning and 2 tablets (1000MG ) by mouth every evening Patient taking differently: Take 500-1,000 mg by mouth See admin instructions. 500 mg every morning and 1000 mg at bedtime 11/02/17  Yes Wieting, Richard, MD  oxyCODONE (OXY IR/ROXICODONE) 5 MG immediate release tablet Take 1-2 tablets (5-10 mg total) by mouth every 4 (four) hours as needed for moderate pain or severe pain. Patient taking differently: Take 10 mg by mouth daily as needed for moderate pain or severe pain.  02/07/18  Yes Katha HammingKonidena, Snehalatha, MD  potassium chloride (K-DUR,KLOR-CON) 10 MEQ tablet Take 10 mEq by mouth daily.   Yes [provider]  senna-docusate (SENOKOT-S) 8.6-50 MG tablet Take 1 tablet by mouth at bedtime as needed for mild constipation. Patient taking differently: Take 1 tablet by mouth daily.  12/25/17  Yes Mayo, Allyn KennerKaty Dodd, MD  testosterone cypionate (DEPOTESTOSTERONE CYPIONATE) 200 MG/ML injection Inject 200 mg into the muscle every 14 (fourteen) days.   Yes [provider]  thiamine 100 MG  tablet Take 1 tablet (100 mg total) by mouth daily. 11/02/17  Yes Wieting, Richard, MD  bisacodyl (DULCOLAX) 5 MG EC tablet Take 1 tablet (5 mg total) by mouth daily as needed for moderate constipation. Patient not taking: Reported on 02/04/2018 12/25/17   MayoAllyn Kenner, MD  citalopram (CELEXA) 20 MG tablet Take 1 tablet (20 mg total) by mouth daily. Patient not taking: Reported on 04/01/2018 11/02/17   Alford Highland, MD  diltiazem (CARDIZEM CD) 180 MG 24 hr capsule Take 1 capsule (180 mg total) by mouth daily. 11/02/17 03/02/18  Alford Highland, MD  LORazepam (ATIVAN) 1 MG tablet Take 1 tablet (1 mg total) by mouth every 8 (eight) hours as needed for anxiety. Patient not taking: Reported on 04/01/2018 02/07/18 02/07/19  Katha Hamming, MD        VITAL SIGNS:  Blood pressure (!) 157/104, pulse (!) 117, temperature 98.4 F (36.9 C), temperature source Oral, resp. rate 20, height 5\' 11"  (1.803 m), weight 83.9 kg, SpO2 97 %.  PHYSICAL EXAMINATION:  Physical Exam  Constitutional: He is oriented to person, place, and time. He appears well-developed and well-nourished. He is active and cooperative.  Non-toxic appearance. He does not have a sickly appearance. He appears ill. No distress. He is not intubated.  HENT:  Head: Atraumatic.  Mouth/Throat: Oropharynx is clear and moist. No oropharyngeal exudate.  Eyes: Conjunctivae, EOM and lids are normal. No scleral icterus.  Neck: Neck supple. No JVD present. No thyromegaly present.  Cardiovascular: Normal rate, regular rhythm, S1 normal, S2 normal and normal heart sounds.  No extrasystoles are present. Exam reveals no gallop, no S3, no S4, no distant heart sounds and no friction rub.  No murmur heard. Pulmonary/Chest: Effort normal. No accessory muscle usage or stridor. No apnea, no tachypnea and no bradypnea. He is not intubated. No respiratory distress. He has no decreased breath sounds. He has no wheezes. He has no rhonchi. He has no rales.  Abdominal: Soft. He exhibits no distension. Bowel sounds are decreased. There is no tenderness. There is no rigidity, no rebound and no guarding.  Musculoskeletal: Normal range of motion. He exhibits no edema or tenderness.  Lymphadenopathy:    He has no cervical adenopathy.  Neurological: He is alert and oriented to person, place, and time. He is not disoriented.  Skin: Skin is warm and intact. He is diaphoretic. No erythema. No pallor.  Psychiatric: He has a normal mood and affect. His speech is normal and behavior is normal. Judgment and thought content normal. Cognition and memory are normal.   LABORATORY PANEL:   CBC Recent Labs  Lab 04/01/18 2007  WBC 4.3  HGB 13.3  HCT 41.9  PLT 99*    ------------------------------------------------------------------------------------------------------------------  Chemistries  Recent Labs  Lab 04/01/18 2103  NA 141  K 3.4*  CL 98  CO2 29  GLUCOSE 154*  BUN 10  CREATININE 0.77  CALCIUM 9.1  AST 56*  ALT 24  ALKPHOS 78  BILITOT 0.8   ------------------------------------------------------------------------------------------------------------------  Cardiac Enzymes No results for input(s): TROPONINI in the last 168 hours. ------------------------------------------------------------------------------------------------------------------  RADIOLOGY:  No results found.    IMPRESSION AND PLAN:   A/P: 22M PMHx EtOH dependence/abuse/intoxication/withdrawal p/w EtOH dependence/abuse/intoxication/withdrawal. Hypokalemia, hyperglycemia, transaminasemia, thrombocytopenia. -EtOH dependence/abuse/intoxication/withdrawal: CIWA, Librium, thiamine, folate, MVI. I see no value in detoxification. D/C ASAP. -Hypokalemia: Replete. -Transaminasemia, thrombocytopenia: 2/2 EtOH. -No Tylenol. -No narcotics. -c/w home meds/formulary subs. -FEN/GI: Regular diet. -DVT PPx: Lovenox. -Code status: Full code. -Disposition: Observation, < 2 midnights.  All the records are reviewed and case discussed with ED provider. Management plans discussed with the patient, family and they are in agreement.  CODE STATUS: Full code.  TOTAL TIME TAKING CARE OF THIS PATIENT: 75 minutes.    Barbaraann Rondo M.D on 04/03/2018 at 5:51 AM  Between 7am to 6pm - Pager - [phone]  After 6pm go to [email or web address] - Social research officer, government  [facility]  Office  [phone]  CC: Primary care physician; [name], MD   Note: This dictation was prepared with Dragon dictation along with smaller phrase technology. Any transcriptional errors that result from this process are unintentional.

## 2018-04-03 NOTE — Progress Notes (Signed)
Inpatient Diabetes Program Recommendations  AACE/ADA: New Consensus Statement on Inpatient Glycemic Control (2019)  Target Ranges:  Prepandial:   less than 140 mg/dL      Peak postprandial:   less than 180 mg/dL (1-2 hours)      Critically ill patients:  140 - 180 mg/dL   Results for Camelia PhenesDUNCAN, Keithen D (MRN [id number]) as of 04/03/2018 13:59  Ref. Range 04/03/2018 10:13 04/03/2018 11:56  Glucose-Capillary Latest Ref Range: 70 - 99 mg/dL 865127 (H)  Novolog 2 units @ 10:26 40 (LL)   Review of Glycemic Control  Diabetes history: DM2 Outpatient Diabetes medications: Metformin 500 mg daily Current orders for Inpatient glycemic control: Novolog 0-15 units TID with meals, Novolog 0-5 units QHS  Inpatient Diabetes Program Recommendations:  Correction (SSI): Please consider decreasing Novolog correction to Sensitive scale (Novolog 0-9 units TID with meals).  Thanks, Orlando PennerMarie Arda Daggs, RN, MSN, CDE Diabetes Coordinator Inpatient Diabetes Program [phone] (Team Pager from 8am to 5pm)

## 2018-04-03 NOTE — ED Notes (Signed)
Dr Dolores FrameSung informed of pt current vital signs and prior oral dose of ativan. Order received for ativan 2 mg IM.

## 2018-04-03 NOTE — Clinical Social Work Note (Signed)
CSW received consult for substance abuse, patient very confused, CSW to speak with patient at a later time.  Ervin KnackEric R. Hassan Rowannterhaus, MSW, Theresia MajorsLCSWA [phone]  04/03/2018 12:49 PM

## 2018-04-04 DIAGNOSIS — F10229 Alcohol dependence with intoxication, unspecified: Secondary | ICD-10-CM | POA: Diagnosis present

## 2018-04-04 DIAGNOSIS — F419 Anxiety disorder, unspecified: Secondary | ICD-10-CM | POA: Diagnosis present

## 2018-04-04 DIAGNOSIS — Z89422 Acquired absence of other left toe(s): Secondary | ICD-10-CM | POA: Diagnosis not present

## 2018-04-04 DIAGNOSIS — F1123 Opioid dependence with withdrawal: Secondary | ICD-10-CM | POA: Diagnosis present

## 2018-04-04 DIAGNOSIS — R740 Nonspecific elevation of levels of transaminase and lactic acid dehydrogenase [LDH]: Secondary | ICD-10-CM | POA: Diagnosis present

## 2018-04-04 DIAGNOSIS — Y908 Blood alcohol level of 240 mg/100 ml or more: Secondary | ICD-10-CM | POA: Diagnosis present

## 2018-04-04 DIAGNOSIS — G7000 Myasthenia gravis without (acute) exacerbation: Secondary | ICD-10-CM | POA: Diagnosis present

## 2018-04-04 DIAGNOSIS — I959 Hypotension, unspecified: Secondary | ICD-10-CM | POA: Diagnosis not present

## 2018-04-04 DIAGNOSIS — Z7982 Long term (current) use of aspirin: Secondary | ICD-10-CM | POA: Diagnosis not present

## 2018-04-04 DIAGNOSIS — E1165 Type 2 diabetes mellitus with hyperglycemia: Secondary | ICD-10-CM | POA: Diagnosis present

## 2018-04-04 DIAGNOSIS — I480 Paroxysmal atrial fibrillation: Secondary | ICD-10-CM | POA: Diagnosis present

## 2018-04-04 DIAGNOSIS — I10 Essential (primary) hypertension: Secondary | ICD-10-CM | POA: Diagnosis present

## 2018-04-04 DIAGNOSIS — I482 Chronic atrial fibrillation, unspecified: Secondary | ICD-10-CM | POA: Diagnosis present

## 2018-04-04 DIAGNOSIS — M545 Low back pain: Secondary | ICD-10-CM | POA: Diagnosis present

## 2018-04-04 DIAGNOSIS — Z888 Allergy status to other drugs, medicaments and biological substances status: Secondary | ICD-10-CM | POA: Diagnosis not present

## 2018-04-04 DIAGNOSIS — G9341 Metabolic encephalopathy: Secondary | ICD-10-CM | POA: Diagnosis not present

## 2018-04-04 DIAGNOSIS — F10230 Alcohol dependence with withdrawal, uncomplicated: Secondary | ICD-10-CM

## 2018-04-04 DIAGNOSIS — K769 Liver disease, unspecified: Secondary | ICD-10-CM | POA: Diagnosis present

## 2018-04-04 DIAGNOSIS — Z79899 Other long term (current) drug therapy: Secondary | ICD-10-CM | POA: Diagnosis not present

## 2018-04-04 DIAGNOSIS — F329 Major depressive disorder, single episode, unspecified: Secondary | ICD-10-CM | POA: Diagnosis present

## 2018-04-04 DIAGNOSIS — F10231 Alcohol dependence with withdrawal delirium: Secondary | ICD-10-CM | POA: Diagnosis present

## 2018-04-04 DIAGNOSIS — D6959 Other secondary thrombocytopenia: Secondary | ICD-10-CM | POA: Diagnosis present

## 2018-04-04 DIAGNOSIS — E876 Hypokalemia: Secondary | ICD-10-CM | POA: Diagnosis present

## 2018-04-04 DIAGNOSIS — Z886 Allergy status to analgesic agent status: Secondary | ICD-10-CM | POA: Diagnosis not present

## 2018-04-04 DIAGNOSIS — Z7984 Long term (current) use of oral hypoglycemic drugs: Secondary | ICD-10-CM | POA: Diagnosis not present

## 2018-04-04 LAB — BASIC METABOLIC PANEL
Anion gap: 14 (ref 5–15)
BUN: 13 mg/dL (ref 6–20)
CALCIUM: 8.9 mg/dL (ref 8.9–10.3)
CO2: 24 mmol/L (ref 22–32)
CREATININE: 0.78 mg/dL (ref 0.61–1.24)
Chloride: 102 mmol/L (ref 98–111)
GFR calc Af Amer: >60 mL/min (ref >60)
GFR calc non Af Amer: >60 mL/min (ref >60)
GLUCOSE: 111 mg/dL — AB (ref 70–99)
Potassium: 2.7 mmol/L — CL (ref 3.5–5.1)
Sodium: 140 mmol/L (ref 135–145)

## 2018-04-04 LAB — CBC
HCT: 39 % (ref 39.0–52.0)
Hemoglobin: 12.6 g/dL — ABNORMAL LOW (ref 13.0–17.0)
MCH: 27 pg (ref 26.0–34.0)
MCHC: 32.3 g/dL (ref 30.0–36.0)
MCV: 83.5 fL (ref 80.0–100.0)
Platelets: 98 10*3/uL — ABNORMAL LOW (ref 150–400)
RBC: 4.67 MIL/uL (ref 4.22–5.81)
RDW: 22.3 % — AB (ref 11.5–15.5)
WBC: 6.1 10*3/uL (ref 4.0–10.5)
nRBC: 0 % (ref 0.0–0.2)

## 2018-04-04 LAB — GLUCOSE, CAPILLARY
GLUCOSE-CAPILLARY: 131 mg/dL — AB (ref 70–99)
Glucose-Capillary: 119 mg/dL — ABNORMAL HIGH (ref 70–99)
Glucose-Capillary: 125 mg/dL — ABNORMAL HIGH (ref 70–99)

## 2018-04-04 LAB — PHOSPHORUS: Phosphorus: 4.5 mg/dL (ref 2.5–4.6)

## 2018-04-04 LAB — MAGNESIUM: Magnesium: 1.9 mg/dL (ref 1.7–2.4)

## 2018-04-04 LAB — POTASSIUM: Potassium: 3.3 mmol/L — ABNORMAL LOW (ref 3.5–5.1)

## 2018-04-04 MED ORDER — CHLORDIAZEPOXIDE HCL 10 MG PO CAPS
50.0000 mg | ORAL_CAPSULE | Freq: Three times a day (TID) | ORAL | Status: DC
  Administered 2018-04-04 – 2018-04-08 (×10): 50 mg via ORAL
  Filled 2018-04-04 (×2): qty 5
  Filled 2018-04-04: qty 2
  Filled 2018-04-04 (×3): qty 5
  Filled 2018-04-04: qty 2
  Filled 2018-04-04 (×3): qty 5

## 2018-04-04 MED ORDER — TRAZODONE HCL 50 MG PO TABS
50.0000 mg | ORAL_TABLET | Freq: Every evening | ORAL | Status: DC | PRN
  Administered 2018-04-05 – 2018-04-10 (×3): 50 mg via ORAL
  Filled 2018-04-04 (×4): qty 1

## 2018-04-04 MED ORDER — LORAZEPAM 1 MG PO TABS: 1.0000 mg | ORAL_TABLET | Freq: Four times a day (QID) | ORAL | Status: AC | PRN

## 2018-04-04 MED ORDER — POTASSIUM CHLORIDE 10 MEQ/100ML IV SOLN
10.0000 meq | INTRAVENOUS | Status: AC
  Administered 2018-04-04 (×2): 10 meq via INTRAVENOUS
  Filled 2018-04-04 (×2): qty 100

## 2018-04-04 MED ORDER — LORAZEPAM 2 MG/ML IJ SOLN
1.0000 mg | Freq: Four times a day (QID) | INTRAMUSCULAR | Status: AC | PRN
  Administered 2018-04-05 – 2018-04-06 (×2): 1 mg via INTRAVENOUS
  Filled 2018-04-04 (×2): qty 1

## 2018-04-04 MED ORDER — MAGNESIUM SULFATE 2 GM/50ML IV SOLN
2.0000 g | Freq: Once | INTRAVENOUS | Status: AC
  Administered 2018-04-04: 2 g via INTRAVENOUS
  Filled 2018-04-04: qty 50

## 2018-04-04 MED ORDER — POTASSIUM CHLORIDE 10 MEQ/100ML IV SOLN
10.0000 meq | INTRAVENOUS | Status: AC
  Administered 2018-04-04 – 2018-04-05 (×4): 10 meq via INTRAVENOUS
  Filled 2018-04-04 (×4): qty 100

## 2018-04-04 MED ORDER — POTASSIUM CHLORIDE CRYS ER 20 MEQ PO TBCR
40.0000 meq | EXTENDED_RELEASE_TABLET | ORAL | Status: AC
  Administered 2018-04-04 (×2): 40 meq via ORAL
  Filled 2018-04-04 (×2): qty 2

## 2018-04-04 MED ORDER — CHLORDIAZEPOXIDE HCL 5 MG PO CAPS
25.0000 mg | ORAL_CAPSULE | Freq: Four times a day (QID) | ORAL | Status: DC | PRN
  Administered 2018-04-09: 25 mg via ORAL
  Filled 2018-04-04: qty 1
  Filled 2018-04-04: qty 5

## 2018-04-04 NOTE — Progress Notes (Addendum)
[facility] [place] at [facility]   PATIENT NAME: Kurt SimmerJohn Horton    MR#:  [id number]  DATE OF BIRTH:  May 19, 1960  SUBJECTIVE:  CHIEF COMPLAINT: Patient is still lethargic and delusional.  Sitter at bedside.  Unable to get any history from the patient CIWA-6-7  REVIEW OF SYSTEMS:   Review of system unobtainable as the patient is altered DRUG ALLERGIES:   Allergies  Allergen Reactions  . Nsaids Other (See Comments)    Duodenal ulcers, GI bleeding  . Other Other (See Comments)    Patient states he can't take any "mycin" medications because myasthenia gravis.    VITALS:  Blood pressure (!) 146/95, pulse 88, temperature 97.8 F (36.6 C), temperature source Oral, resp. rate 19, height 5\' 11"  (1.803 m), weight 81.9 kg, SpO2 97 %.  PHYSICAL EXAMINATION:  GENERAL:  57 y.o.-year-old patient lying in the bed with no acute distress.  EYES: Pupils equal, round, reactive to light and accommodation. No scleral icterus HEENT: Head atraumatic, normocephalic. Oropharynx and nasopharynx clear.  NECK:  Supple, no jugular venous distention. No thyroid enlargement, no tenderness.  LUNGS: Normal breath sounds bilaterally, no wheezing, rales,rhonchi or crepitation. No use of accessory muscles of respiration.  CARDIOVASCULAR: S1, S2 normal. No murmurs, rubs, or gallops.  ABDOMEN: Soft, nontender, nondistended. Bowel sounds present.  EXTREMITIES: No pedal edema, cyanosis, or clubbing.  NEUROLOGIC: Delirious sensation intact. Gait not checked.  PSYCHIATRIC: The patient is delirious SKIN: No obvious rash, lesion, or ulcer.    LABORATORY PANEL:   CBC Recent Labs  Lab 04/01/18 2007  WBC 4.3  HGB 13.3  HCT 41.9  PLT 99*   ------------------------------------------------------------------------------------------------------------------  Chemistries  Recent Labs  Lab 04/01/18 2103  04/04/18 0402  NA 141  --  140  K 3.4*  --  2.7*  CL 98  --  102  CO2 29  --  24   GLUCOSE 154*  --  111*  BUN 10  --  13  CREATININE 0.77  --  0.78  CALCIUM 9.1  --  8.9  MG  --    < > 1.9  AST 56*  --   --   ALT 24  --   --   ALKPHOS 78  --   --   BILITOT 0.8  --   --    < > = values in this interval not displayed.   ------------------------------------------------------------------------------------------------------------------  Cardiac Enzymes No results for input(s): TROPONINI in the last 168 hours. ------------------------------------------------------------------------------------------------------------------  RADIOLOGY:  No results found.  EKG:   Orders placed or performed during the hospital encounter of 02/03/18  . ED EKG  . ED EKG  . EKG 12-Lead  . EKG 12-Lead    ASSESSMENT AND PLAN:   A/P: 58M PMHx EtOH  Acute metabolic encephalopathy: Secondary to alcohol withdrawal Patient is delirious ciwa protocol Librium, thiamine, folate, MVI Patient will be benefited with outpatient AA  Hypokalemia Potassium at 2.7 replete and recheck.  Magnesium at 1.9  Chronic atrial fibrillation Rate controlled.  Continue home medication aspirin 325 mg, amiodarone and Cardizem  Essential hypertension continue home medication metoprolol and Cardizem and titrate as needed   Diabetes-insulin sliding scale  Thrombocytopenia in the setting of alcohol abuse No active bleeding or bruising.  Monitor platelet count closely CBC in a.m.    -DVT PPx: Lovenox.  If platelet count is greater than 100,000 -Code status: Full code. -Disposition: Anticipating home     All the records are reviewed and case  discussed with Care Management/Social Workerr. Management plans discussed with the patient, family and they are in agreement.  CODE STATUS: FC  TOTAL TIME TAKING CARE OF THIS PATIENT: 35 minutes.   POSSIBLE D/C IN 2 DAYS, DEPENDING ON CLINICAL CONDITION.  Note: This dictation was prepared with Dragon dictation along with smaller phrase technology. Any  transcriptional errors that result from this process are unintentional.   Ramonita Lab M.D on 04/04/2018 at 1:22 PM  Between 7am to 6pm - Pager - [phone] After 6pm go to [email or web address] - password [email or web address] [facility]  [place] [facility]  Office  [phone]  CC: Primary care physician; [name], MD

## 2018-04-04 NOTE — Consult Note (Signed)
PHARMACY ELECTROLYTE CONSULT  Pharmacy consulted to replace electolytes in tis 57 year old male w/ ETOH abuse K= 2.7 Mg= 1.9 Phos= 4.5  Will give Klor-Con 40 mEq po Q4H x 2 doses and potassium chloride 10 mEq IV x 2 doses. Recheck K at 18:00. Discontinue remaining K Phos doses. Magnesium sulfate 2 gm IV x 1.   Follow electrolytes per protocol.  Bronda Alfred A. Madison Placeookson, VermontPharm.D, BCPS Clinical Pharmacist

## 2018-04-04 NOTE — Progress Notes (Signed)
MEDICATION RELATED CONSULT NOTE - INITIAL   Pharmacy Consult for Electrolytes  Indication: electrolytes   Allergies  Allergen Reactions  . Nsaids Other (See Comments)    Duodenal ulcers, GI bleeding  . Other Other (See Comments)    Patient states he can't take any "mycin" medications because myasthenia gravis.    Patient Measurements: Height: 5\' 11"  (180.3 cm) Weight: 180 lb 8 oz (81.9 kg) IBW/kg (Calculated) : 75.3 Adjusted Body Weight:   Vital Signs: Temp: 98.4 F (36.9 C) (11/28 1619) Temp Source: Oral (11/28 1619) BP: 120/82 (11/28 1619) Pulse Rate: 91 (11/28 1619) Intake/Output from previous day: 11/27 0701 - 11/28 0700 In: 1490 [P.O.:240; IV Piggyback:1250] Out: -  Intake/Output from this shift: Total I/O In: 227.6 [IV Piggyback:227.6] Out: 1075 [Urine:1075]  Labs: Recent Labs    04/01/18 2007 04/01/18 2103 04/03/18 0631 04/04/18 0402 04/04/18 1402  WBC 4.3  --   --   --  6.1  HGB 13.3  --   --   --  12.6*  HCT 41.9  --   --   --  39.0  PLT 99*  --   --   --  98*  CREATININE  --  0.77  --  0.78  --   MG  --   --  1.2* 1.9  --   PHOS  --   --  1.9* 4.5  --   ALBUMIN  --  4.4  --   --   --   PROT  --  7.4  --   --   --   AST  --  56*  --   --   --   ALT  --  24  --   --   --   ALKPHOS  --  78  --   --   --   BILITOT  --  0.8  --   --   --    Estimated Creatinine Clearance: 108.5 mL/min (by C-G formula based on SCr of 0.78 mg/dL).   Microbiology: No results found for this or any previous visit (from the past 720 hour(s)).  Medical History: Past Medical History:  Diagnosis Date  . A-fib (HCC)   . Alcohol abuse   . Alcohol withdrawal (HCC) 11/12/2016  . Diabetes mellitus without complication (HCC)   . DJD (degenerative joint disease)   . Hypertension   . Myasthenia gravis (HCC)   . Renal disorder     Medications:  Medications Prior to Admission  Medication Sig Dispense Refill Last Dose  . amiodarone (PACERONE) 200 MG tablet Take 1 tablet  (200 mg total) by mouth daily. 30 tablet 0 unknown at unknown  . aspirin EC 325 MG tablet Take 325 mg by mouth daily.   unknown at unknown  . B Complex-C (B-COMPLEX WITH VITAMIN C) tablet Take 1 tablet by mouth daily. 30 tablet 0 unknown at unknown  . busPIRone (BUSPAR) 15 MG tablet Take 15 mg by mouth 2 (two) times daily.   unknown at unknown  . folic acid (FOLVITE) 1 MG tablet Take 1 tablet (1 mg total) by mouth daily. 30 tablet 0 unknown at unknown  . furosemide (LASIX) 40 MG tablet Take 40 mg by mouth daily.   unknown at unknown  . gabapentin (NEURONTIN) 300 MG capsule Take 2 capsules (600 mg total) by mouth 3 (three) times daily. 180 capsule 0 unknown at unknown  . lisinopril (PRINIVIL,ZESTRIL) 10 MG tablet Take 10 mg by mouth daily.   unknonw  at unknown  . metFORMIN (GLUCOPHAGE) 500 MG tablet Take 1 tablet (500 mg total) by mouth daily. 30 tablet 0 unknown at unknown  . metoprolol succinate (TOPROL-XL) 50 MG 24 hr tablet Take 1 tablet (50 mg total) by mouth daily. Take with or immediately following a meal. 30 tablet 0 unknown at unknown  . multivitamin (ONE-A-DAY MEN'S) TABS tablet Take 1 tablet by mouth daily. 30 tablet 0 unknown at unknown  . mycophenolate (CELLCEPT) 500 MG tablet Take 1 tablet (500 mg total) by mouth 2 (two) times daily. Take 1 tablet (500MG ) by mouth every morning and 2 tablets (1000MG ) by mouth every evening (Patient taking differently: Take 500-1,000 mg by mouth See admin instructions. 500 mg every morning and 1000 mg at bedtime) 90 tablet 0 unknown at unknown  . oxyCODONE (OXY IR/ROXICODONE) 5 MG immediate release tablet Take 1-2 tablets (5-10 mg total) by mouth every 4 (four) hours as needed for moderate pain or severe pain. (Patient taking differently: Take 10 mg by mouth daily as needed for moderate pain or severe pain. ) 30 tablet 0 unknown at unknown  . potassium chloride (K-DUR,KLOR-CON) 10 MEQ tablet Take 10 mEq by mouth daily.   unknonw at unknown  . senna-docusate  (SENOKOT-S) 8.6-50 MG tablet Take 1 tablet by mouth at bedtime as needed for mild constipation. (Patient taking differently: Take 1 tablet by mouth daily. ) 30 tablet 0 unknown at unknown  . testosterone cypionate (DEPOTESTOSTERONE CYPIONATE) 200 MG/ML injection Inject 200 mg into the muscle every 14 (fourteen) days.   unknown at unknown  . thiamine 100 MG tablet Take 1 tablet (100 mg total) by mouth daily. 30 tablet 0 unknown at unknown  . bisacodyl (DULCOLAX) 5 MG EC tablet Take 1 tablet (5 mg total) by mouth daily as needed for moderate constipation. (Patient not taking: Reported on 02/04/2018) 30 tablet 0 Not Taking at Unknown time  . citalopram (CELEXA) 20 MG tablet Take 1 tablet (20 mg total) by mouth daily. (Patient not taking: Reported on 04/01/2018) 30 tablet 0 Not Taking at Unknown time  . diltiazem (CARDIZEM CD) 180 MG 24 hr capsule Take 1 capsule (180 mg total) by mouth daily. 30 capsule 0 unknown at unknown  . LORazepam (ATIVAN) 1 MG tablet Take 1 tablet (1 mg total) by mouth every 8 (eight) hours as needed for anxiety. (Patient not taking: Reported on 04/01/2018) 30 tablet 0 Not Taking at Unknown time    Assessment: PHARMACY ELECTROLYTE CONSULT  Pharmacy consulted to replace electolytes in tis 57 year old male w/ ETOH abuse K= 2.7 Mg= 1.9 Phos= 4.5  Will give Klor-Con 40 mEq po Q4H x 2 doses and potassium chloride 10 mEq IV x 2 doses. Recheck K at 18:00. Discontinue remaining K Phos doses. Magnesium sulfate 2 gm IV x 1.   11/28:  K @ 1747 = 3.3 Will order KCl 10 mEq IV X 4 to start on 11/28 @ 19:00 Will recheck electrolytes on 11/29 @ 0500.    Arrayah Connors D 04/04/2018,6:40 PM

## 2018-04-04 NOTE — Consult Note (Addendum)
[facility] Face-to-Face Psychiatry Consult   Reason for Consult:  Alcohol use disorder and withdrawal Referring Physician:  Dr. Levander Campion Patient Identification: Kurt Horton MRN:  [id number] Principal Diagnosis: <principal problem not specified> Diagnosis:  Active Problems:   Alcohol withdrawal (HCC)   Total Time spent with patient: 1 hour  Subjective:   Kurt Horton is a 57 y.o. male patient with severe alcohol use disorder,presesnted in the hospital with acute intoxication.  BAC was 304 on arrival. Kurt Horton has been in ED frequently in the past 6 months at least.    HPI:   Kurt Horton is a 57 y.o. male patient with severe alcohol use disorder, and was brought in to the hospital by EMS intoxicated with Alcohol.  His family called EMS after found him laying in bed with 70 airplane bottles of [employer]. Reportedly, pt drank all of that in 3 days.   BAC was 304 on arrival.   Pt seen in the room. Kurt Horton is awake, but somewhat sleepy. Kurt Horton was able to answer most questions coherently, but the writer needed to repeatedly wake him up.  Kurt Horton said that Kurt Horton wants to get sober this time. Kurt Horton admitted drinking 10-15 airplane bottles of Rum daily for years.  Behr Cislo said that Bracen Schum went to rehab before and did not work.    Kurt Horton denied having hx of withdrawal Seizure. Kurt Horton denied SI.  Kurt Horton said that Kurt Horton lives with parents and is on disability, but gave the Clinical research associate a wrong number or his parents.   Bedside sitter reports that pt has been more confused earlier, talking to himself, thinking Kurt Horton was in his own room.  Kurt Horton was "grabbing" things out of the air, as if Kurt Horton was having visual hallucinations.   Past Psychiatric History: severe alcohol use disorder, per Epic, Kurt Horton has at least 10 ED or hospital visits for alcohol intoxication for the past 6 months. At times, Chessie Neuharth visited ED 2-3x a week.   Risk to Self:   Risk to Others:   Prior Inpatient Therapy:   Prior Outpatient Therapy:    Past Medical History:  Past Medical History:   Diagnosis Date  . A-fib (HCC)   . Alcohol abuse   . Alcohol withdrawal (HCC) 11/12/2016  . Diabetes mellitus without complication (HCC)   . DJD (degenerative joint disease)   . Hypertension   . Myasthenia gravis (HCC)   . Renal disorder     Past Surgical History:  Procedure Laterality Date  . AMPUTATION TOE Left 12/29/2015   Procedure: AMPUTATION TOE;  Surgeon: Gwyneth Revels, DPM;  Location: [facility] ORS;  Service: Podiatry;  Laterality: Left;  . ESOPHAGOGASTRODUODENOSCOPY N/A 02/05/2018   Procedure: ESOPHAGOGASTRODUODENOSCOPY (EGD);  Surgeon: Pasty Spillers, MD;  Location: [facility] ENDOSCOPY;  Service: Endoscopy;  Laterality: N/A;  . PERIPHERAL VASCULAR CATHETERIZATION N/A 12/27/2015   Procedure: Lower Extremity Angiography;  Surgeon: Annice Needy, MD;  Location: [facility] INVASIVE CV LAB;  Service: Cardiovascular;  Laterality: N/A;  . right hip surgery    . right shoulder surgery     Family History:  Family History  Problem Relation Age of Onset  . Rheum arthritis Father    Family Psychiatric  History: unknown Social History:  Social History   Substance and Sexual Activity  Alcohol Use Yes   Comment: Pt had tequila and rum, at least a 5th or a pint a day     Social History   Substance and Sexual Activity  Drug Use No  Social History   Socioeconomic History  . Marital status: Divorced    Spouse name: Not on file  . Number of children: Not on file  . Years of education: Not on file  . Highest education level: Not on file  Occupational History  . Not on file  Social Needs  . Financial resource strain: Not on file  . Food insecurity:    Worry: Not on file    Inability: Not on file  . Transportation needs:    Medical: Not on file    Non-medical: Not on file  Tobacco Use  . Smoking status: Current Every Day Smoker    Packs/day: 0.50    Types: Cigarettes  . Smokeless tobacco: Never Used  Substance and Sexual Activity  . Alcohol use: Yes    Comment: Pt had tequila  and rum, at least a 5th or a pint a day  . Drug use: No  . Sexual activity: Not on file  Lifestyle  . Physical activity:    Days per week: Not on file    Minutes per session: Not on file  . Stress: Not on file  Relationships  . Social connections:    Talks on phone: Not on file    Gets together: Not on file    Attends religious service: Not on file    Active member of club or organization: Not on file    Attends meetings of clubs or organizations: Not on file    Relationship status: Not on file  Other Topics Concern  . Not on file  Social History Narrative   Live with Parents, ambulates with a cane. Falls lately.   Additional Social History:    Allergies:   Allergies  Allergen Reactions  . Nsaids Other (See Comments)    Duodenal ulcers, GI bleeding  . Other Other (See Comments)    Patient states Kadia Abaya can't take any "mycin" medications because myasthenia gravis.    Labs:  Results for orders placed or performed during the hospital encounter of 04/01/18 (from the past 48 hour(s))  Magnesium     Status: Abnormal   Collection Time: 04/03/18  6:31 AM  Result Value Ref Range   Magnesium 1.2 (L) 1.7 - 2.4 mg/dL    Comment: Performed at [facility], [address] [place], [place] [address]  Phosphorus     Status: Abnormal   Collection Time: 04/03/18  6:31 AM  Result Value Ref Range   Phosphorus 1.9 (L) 2.5 - 4.6 mg/dL    Comment: Performed at [facility], [address] [place], [place] [address]  Glucose, capillary     Status: Abnormal   Collection Time: 04/03/18 10:13 AM  Result Value Ref Range   Glucose-Capillary 127 (H) 70 - 99 mg/dL   Comment 1 Notify RN    Comment 2 Document in Chart   Glucose, capillary     Status: Abnormal   Collection Time: 04/03/18 11:56 AM  Result Value Ref Range   Glucose-Capillary 40 (LL) 70 - 99 mg/dL   Comment 1 Notify RN    Comment 2 Document in Chart   Glucose, capillary     Status: Abnormal   Collection Time:  04/03/18  2:23 PM  Result Value Ref Range   Glucose-Capillary 117 (H) 70 - 99 mg/dL  Glucose, capillary     Status: Abnormal   Collection Time: 04/03/18  9:34 PM  Result Value Ref Range   Glucose-Capillary 136 (H) 70 - 99 mg/dL  Basic metabolic panel     Status: Abnormal   Collection Time: 04/04/18  4:02 AM  Result Value Ref Range   Sodium 140 135 - 145 mmol/L   Potassium 2.7 (LL) 3.5 - 5.1 mmol/L    Comment: CRITICAL RESULT CALLED TO, READ BACK BY AND VERIFIED WITH BRITTANY PECINICH @0445  04/04/18 [facility]    Chloride 102 98 - 111 mmol/L   CO2 24 22 - 32 mmol/L   Glucose, Bld 111 (H) 70 - 99 mg/dL   BUN 13 6 - 20 mg/dL   Creatinine, Ser 1.610.78 0.61 - 1.24 mg/dL   Calcium 8.9 8.9 - 09.610.3 mg/dL   GFR calc non Af Amer >60 >60 mL/min   GFR calc Af Amer >60 >60 mL/min   Anion gap 14 5 - 15    Comment: Performed at [facility], [address] [place], [place] [address]  Magnesium     Status: None   Collection Time: 04/04/18  4:02 AM  Result Value Ref Range   Magnesium 1.9 1.7 - 2.4 mg/dL    Comment: Performed at [facility], [address] [place], [place] [address]  Phosphorus     Status: None   Collection Time: 04/04/18  4:02 AM  Result Value Ref Range   Phosphorus 4.5 2.5 - 4.6 mg/dL    Comment: Performed at [facility], [address] [place], [place] [address]  Glucose, capillary     Status: Abnormal   Collection Time: 04/04/18  7:48 AM  Result Value Ref Range   Glucose-Capillary 119 (H) 70 - 99 mg/dL  Glucose, capillary     Status: Abnormal   Collection Time: 04/04/18 11:39 AM  Result Value Ref Range   Glucose-Capillary 131 (H) 70 - 99 mg/dL   Comment 1 Notify RN    Comment 2 Document in Chart     Current Facility-Administered Medications  Medication Dose Route Frequency Provider Last Rate Last Dose  . amiodarone (PACERONE) tablet 200 mg  200 mg Oral Daily Barbaraann RondoSridharan, Prasanna, MD   200 mg at 04/04/18 0849  . aspirin EC tablet 325 mg  325  mg Oral Daily Barbaraann RondoSridharan, Prasanna, MD   325 mg at 04/04/18 0849  . bisacodyl (DULCOLAX) EC tablet 5 mg  5 mg Oral Daily PRN Barbaraann RondoSridharan, Prasanna, MD      . busPIRone (BUSPAR) tablet 15 mg  15 mg Oral BID Barbaraann RondoSridharan, Prasanna, MD   15 mg at 04/04/18 0848  . chlordiazePOXIDE (LIBRIUM) capsule 25 mg  25 mg Oral QID PRN Enmanuel Zufall, MD      . chlordiazePOXIDE (LIBRIUM) capsule 50 mg  50 mg Oral TID Olyver Hawes, MD      . diltiazem (CARDIZEM CD) 24 hr capsule 180 mg  180 mg Oral Daily Barbaraann RondoSridharan, Prasanna, MD   180 mg at 04/04/18 0848  . enoxaparin (LOVENOX) injection 40 mg  40 mg Subcutaneous Q24H Gouru, Aruna, MD   40 mg at 04/03/18 2053  . folic acid (FOLVITE) tablet 1 mg  1 mg Oral Daily Barbaraann RondoSridharan, Prasanna, MD   1 mg at 04/04/18 0848  . furosemide (LASIX) tablet 40 mg  40 mg Oral Daily Barbaraann RondoSridharan, Prasanna, MD   40 mg at 04/04/18 0849  . gabapentin (NEURONTIN) capsule 600 mg  600 mg Oral TID Barbaraann RondoSridharan, Prasanna, MD   600 mg at 04/04/18 0848  . insulin aspart (novoLOG) injection 0-15 Units  0-15 Units Subcutaneous TID WC Barbaraann RondoSridharan, Prasanna, MD   2 Units at 04/04/18 1221  .  insulin aspart (novoLOG) injection 0-5 Units  0-5 Units Subcutaneous QHS Marjie Skiff, Prasanna, MD      . lisinopril (PRINIVIL,ZESTRIL) tablet 10 mg  10 mg Oral Daily Barbaraann Rondo, MD   10 mg at 04/04/18 0849  . loperamide (IMODIUM) capsule 2 mg  2 mg Oral PRN Barbaraann Rondo, MD   2 mg at 04/04/18 1006  . LORazepam (ATIVAN) tablet 1 mg  1 mg Oral Q6H PRN Delynn Pursley, MD       Or  . LORazepam (ATIVAN) injection 1 mg  1 mg Intravenous Q6H PRN Roselia Snipe, MD      . metoprolol succinate (TOPROL-XL) 24 hr tablet 50 mg  50 mg Oral Daily Barbaraann Rondo, MD   50 mg at 04/04/18 0849  . multivitamin with minerals tablet 1 tablet  1 tablet Oral Daily Barbaraann Rondo, MD   1 tablet at 04/04/18 0848  . mycophenolate (CELLCEPT) capsule 1,000 mg  1,000 mg Oral QHS Barbaraann Rondo, MD   1,000 mg at 04/03/18 2054  . mycophenolate (CELLCEPT)  capsule 500 mg  500 mg Oral Vinnie Langton, Bosie Helper, MD   500 mg at 04/04/18 0848  . ondansetron (ZOFRAN) tablet 4 mg  4 mg Oral Q6H PRN Barbaraann Rondo, MD       Or  . ondansetron (ZOFRAN) injection 4 mg  4 mg Intravenous Q6H PRN Barbaraann Rondo, MD      . senna-docusate (Senokot-S) tablet 1 tablet  1 tablet Oral QHS PRN Barbaraann Rondo, MD      . sodium chloride 0.9 % bolus 1,000 mL  1,000 mL Intravenous Once Irean Hong, MD      . thiamine (VITAMIN B-1) tablet 100 mg  100 mg Oral Daily Barbaraann Rondo, MD   100 mg at 04/04/18 0849   Or  . thiamine (B-1) injection 100 mg  100 mg Intravenous Daily Barbaraann Rondo, MD      . traZODone (DESYREL) tablet 50 mg  50 mg Oral QHS PRN Oralia Manis, MD        Musculoskeletal: Strength & Muscle Tone: within normal limits Gait & Station: unable to asess Patient leans: N/A  Psychiatric Specialty Exam: Physical Exam  ROS  Blood pressure (!) 146/95, pulse 88, temperature 97.8 F (36.6 C), temperature source Oral, resp. rate 19, height 5\' 11"  (1.803 m), weight 81.9 kg, SpO2 97 %.Body mass index is 25.17 kg/m.  General Appearance: Disheveled  Eye Contact:  Absent  Speech:  Garbled and Slow  Volume:  Normal  Mood:  "I don't want to drink again"  Affect:  Constricted  Thought Process:  Descriptions of Associations: Loose  Orientation:  Other:  to place only  Thought Content:  Logical and however sitter reported visual hallucination.  Suicidal Thoughts:  No  Homicidal Thoughts:  No  Memory:  Immediate;   Fair Recent;   Fair Remote;   Fair  Judgement:  Poor  Insight:  Fair  Psychomotor Activity:  Increased  Concentration:  Concentration: Poor and Attention Span: Poor  Recall:  Poor  Fund of Knowledge:  Poor  Language:  Poor      AIMS (if indicated):     Assets:  Desire for Improvement  ADL's:  Intact  Cognition:  WNL  Sleep:        Treatment Plan Summary: Daily contact with patient to assess and evaluate  symptoms and progress in treatment and Medication management   57yo WM with severe alcohol use disorder, multiple frequent visits to hospital ED for  alcohol intoxication and detox.  Currently, Missael Ferrari is delirious, likely from alcohol withdrawal. No SI. Selin Eisler seems to be agreeable for alcohol abuse treatment.  No IVC is indicated at this time, but pt has NO capacity to leave AMA.  Recommend substance abuse treatment.   Plan  # alcohol use disorder, severe, in withdrawal delirium -- continue CIWA (this is 3rd day after last drink on 11/25) -- start Standing Librium 50mg  TID. Plan to taper 25-50% if tolerates.   -- continue to provide Librium 25mg  QID PRN for CIWA>8 -- continue to provide Ativan 1mg  q6h for CIWA>15.  -- pt received a large dose of ativan of 12mg  yesterday.  High dose of ativan can cause delirium itself.    -- recommend substance abuse treatment, inpatient treatment would be preferably if pt is agreeable.   # Mood disorder, r/o SIMD (substance-induced mood disorder) -- need to reassess depression and anxiety -- continue buspar 15mg  BID for now.   -- NO IVC indicated, but pt has no capacity to leave AMA at this time. Continue 1:1 sitter.  -- psych will follow daily.    Disposition: No evidence of imminent risk to self or others at present.   but has NO capacity to leave [facility]  Michaelpaul Apo, MD 04/04/2018 2:04 PM

## 2018-04-05 LAB — GLUCOSE, CAPILLARY
Glucose-Capillary: 154 mg/dL — ABNORMAL HIGH (ref 70–99)
Glucose-Capillary: 117 mg/dL — ABNORMAL HIGH (ref 70–99)
Glucose-Capillary: 166 mg/dL — ABNORMAL HIGH (ref 70–99)
Glucose-Capillary: 108 mg/dL — ABNORMAL HIGH (ref 70–99)

## 2018-04-05 LAB — CBC
HCT: 42.1 % (ref 39.0–52.0)
HEMOGLOBIN: 13.2 g/dL (ref 13.0–17.0)
MCH: 26.8 pg (ref 26.0–34.0)
MCHC: 31.4 g/dL (ref 30.0–36.0)
MCV: 85.6 fL (ref 80.0–100.0)
Platelets: 114 10*3/uL — ABNORMAL LOW (ref 150–400)
RBC: 4.92 MIL/uL (ref 4.22–5.81)
RDW: 22.6 % — ABNORMAL HIGH (ref 11.5–15.5)
WBC: 5.3 10*3/uL (ref 4.0–10.5)
nRBC: 0 % (ref 0.0–0.2)

## 2018-04-05 LAB — BASIC METABOLIC PANEL
Anion gap: 9 (ref 5–15)
BUN: 15 mg/dL (ref 6–20)
CO2: 25 mmol/L (ref 22–32)
Calcium: 9.1 mg/dL (ref 8.9–10.3)
Chloride: 102 mmol/L (ref 98–111)
Creatinine, Ser: 0.77 mg/dL (ref 0.61–1.24)
GFR calc Af Amer: >60 mL/min (ref >60)
GFR calc non Af Amer: >60 mL/min (ref >60)
Glucose, Bld: 195 mg/dL — ABNORMAL HIGH (ref 70–99)
Potassium: 3.1 mmol/L — ABNORMAL LOW (ref 3.5–5.1)
Sodium: 136 mmol/L (ref 135–145)

## 2018-04-05 LAB — MAGNESIUM: Magnesium: 2 mg/dL (ref 1.7–2.4)

## 2018-04-05 LAB — PHOSPHORUS: Phosphorus: 4 mg/dL (ref 2.5–4.6)

## 2018-04-05 MED ORDER — POTASSIUM CHLORIDE CRYS ER 20 MEQ PO TBCR
40.0000 meq | EXTENDED_RELEASE_TABLET | Freq: Four times a day (QID) | ORAL | Status: AC
  Administered 2018-04-05 (×3): 40 meq via ORAL
  Filled 2018-04-05 (×3): qty 2

## 2018-04-05 NOTE — Progress Notes (Signed)
[facility] [place] [facility]   PATIENT NAME: Kurt Horton    MR#:  [id number]  DATE OF BIRTH:  [birth year]  SUBJECTIVE:  CHIEF COMPLAINT: Patient is still lethargic and hallucinating sitter at bedside.  Unable to get any history from the patient CIWA-16 per RN  REVIEW OF SYSTEMS:   Review of system unobtainable as the patient is altered DRUG ALLERGIES:   Allergies  Allergen Reactions  . Nsaids Other (See Comments)    Duodenal ulcers, GI bleeding  . Other Other (See Comments)    Patient states he can't take any "mycin" medications because myasthenia gravis.    VITALS:  Blood pressure 108/80, pulse 88, temperature 98.8 F (37.1 C), temperature source Oral, resp. rate 20, height 5\' 11"  (1.803 m), weight 81.9 kg, SpO2 100 %.  PHYSICAL EXAMINATION:  GENERAL:  57 y.o.-year-old patient lying in the bed with no acute distress.  EYES: Pupils equal, round, reactive to light and accommodation. No scleral icterus HEENT: Head atraumatic, normocephalic. Oropharynx and nasopharynx clear.  NECK:  Supple, no jugular venous distention. No thyroid enlargement, no tenderness.  LUNGS: Normal breath sounds bilaterally, no wheezing, rales,rhonchi or crepitation. No use of accessory muscles of respiration.  CARDIOVASCULAR: S1, S2 normal. No murmurs, rubs, or gallops.  ABDOMEN: Soft, nontender, nondistended. Bowel sounds present.  EXTREMITIES: No pedal edema, cyanosis, or clubbing.  NEUROLOGIC: Delirious sensation intact. Gait not checked.  PSYCHIATRIC: The patient is delirious SKIN: No obvious rash, lesion, or ulcer.    LABORATORY PANEL:   CBC Recent Labs  Lab 04/05/18 0625  WBC 5.3  HGB 13.2  HCT 42.1  PLT 114*   ------------------------------------------------------------------------------------------------------------------  Chemistries  Recent Labs  Lab 04/01/18 2103  04/05/18 0625  NA 141   < > 136  K 3.4*   < > 3.1*  CL 98   < > 102  CO2 29    < > 25  GLUCOSE 154*   < > 195*  BUN 10   < > 15  CREATININE 0.77   < > 0.77  CALCIUM 9.1   < > 9.1  MG  --    < > 2.0  AST 56*  --   --   ALT 24  --   --   ALKPHOS 78  --   --   BILITOT 0.8  --   --    < > = values in this interval not displayed.   ------------------------------------------------------------------------------------------------------------------  Cardiac Enzymes No results for input(s): TROPONINI in the last 168 hours. ------------------------------------------------------------------------------------------------------------------  RADIOLOGY:  No results found.  EKG:   Orders placed or performed during the hospital encounter of 02/03/18  . ED EKG  . ED EKG  . EKG 12-Lead  . EKG 12-Lead    ASSESSMENT AND PLAN:   A/P: 70M PMHx EtOH  Acute metabolic encephalopathy: Secondary to alcohol withdrawal Patient is delirious ciwa protocol Seen and evaluated by psychiatry who is recommending standing Librium 50 mg 3 times daily, tapering plans per psychiatry; Librium 25 mg 4 times daily as needed for CIWA >8 -Recommending Ativan 1 mg every 6 hours for ciwa greater than 15 Librium, thiamine, folate, MVI Patient will be benefited with outpatient AA -Recommending to continue BuSpar 15 mg twice daily for substance-induced mood disorder.  Patient will be assessed for depression and anxiety when she is more awake and alert Sitter for safety  Hypokalemia Potassium at 2.7 repleted and recheck.  3.1 magnesium at 2.0  Chronic atrial fibrillation Rate  controlled.  Continue home medication aspirin 325 mg, amiodarone and Cardizem  Essential hypertension continue home medication metoprolol and Cardizem and titrate as needed   Diabetes-insulin sliding scale  Thrombocytopenia in the setting of alcohol abuse No active bleeding or bruising.  Monitor platelet count closely Redness at 1 14,000    -DVT PPx: Lovenox.  If platelet count is greater than 100,000 -Code status:  Full code. -Disposition: Anticipating home     All the records are reviewed and case discussed with Care Management/Social Workerr. Management plans discussed with the patient, family and they are in agreement.  CODE STATUS: FC  TOTAL TIME TAKING CARE OF THIS PATIENT: 35 minutes.   POSSIBLE D/C IN 2 DAYS, DEPENDING ON CLINICAL CONDITION.  Note: This dictation was prepared with Dragon dictation along with smaller phrase technology. Any transcriptional errors that result from this process are unintentional.   Ramonita LabAruna Candas Deemer M.D on 04/05/2018 at 2:40 PM  Between 7am to 6pm - Pager - [phone] After 6pm go to [email or web address] - password [email or web address] [facility]  [facility]  Office  [phone]  CC: Primary care physician; Barbette ReichmannHande, Vishwanath, MD

## 2018-04-05 NOTE — Care Management (Signed)
Has score as high as 16 on CIWA today

## 2018-04-05 NOTE — Clinical Social Work Note (Signed)
CSW attempted to see patient to provided substance abuse information, but patient was sleeping and would not wake up.  CSW to attempt at a later time.  Ervin KnackEric R. Jessy Cybulski, MSW, Theresia MajorsLCSWA [phone]  04/05/2018 6:22 PM

## 2018-04-05 NOTE — Progress Notes (Signed)
CIWA at 6P = not completed. Patient sleeping, heavily. Will pass along in shift report. Jari FavreSteven M [facility]

## 2018-04-05 NOTE — Consult Note (Signed)
PHARMACY ELECTROLYTE CONSULT  Pharmacy consulted to replace electolytes in tis 57 year old male w/ ETOH abuse K= 3.1 Mg= 2.0 Phos= 4.0  Will give KCL 40 MEQ q 6hr x 3 doses- as pt received 150MEQ of KCL yesterday and only improved fro 2.7>3.1 over the course of 24hr  Labs in the AM   Follow electrolytes per protocol.  Olene FlossMelissa D Kensie Susman, Pharm.D, BCPS Clinical Pharmacist

## 2018-04-06 LAB — CBC
HEMATOCRIT: 40.7 % (ref 39.0–52.0)
Hemoglobin: 12.5 g/dL — ABNORMAL LOW (ref 13.0–17.0)
MCH: 27.2 pg (ref 26.0–34.0)
MCHC: 30.7 g/dL (ref 30.0–36.0)
MCV: 88.5 fL (ref 80.0–100.0)
Platelets: 167 10*3/uL (ref 150–400)
RBC: 4.6 MIL/uL (ref 4.22–5.81)
RDW: 23.2 % — ABNORMAL HIGH (ref 11.5–15.5)
WBC: 6.5 10*3/uL (ref 4.0–10.5)
nRBC: 0 % (ref 0.0–0.2)

## 2018-04-06 LAB — COMPREHENSIVE METABOLIC PANEL
ALK PHOS: 59 U/L (ref 38–126)
ALT: 33 U/L (ref 0–44)
AST: 40 U/L (ref 15–41)
Albumin: 3.8 g/dL (ref 3.5–5.0)
Anion gap: 7 (ref 5–15)
BILIRUBIN TOTAL: 0.8 mg/dL (ref 0.3–1.2)
BUN: 17 mg/dL (ref 6–20)
CO2: 25 mmol/L (ref 22–32)
Calcium: 9 mg/dL (ref 8.9–10.3)
Chloride: 105 mmol/L (ref 98–111)
Creatinine, Ser: 0.86 mg/dL (ref 0.61–1.24)
GFR calc Af Amer: >60 mL/min (ref >60)
GFR calc non Af Amer: >60 mL/min (ref >60)
GLUCOSE: 182 mg/dL — AB (ref 70–99)
Potassium: 4.2 mmol/L (ref 3.5–5.1)
Sodium: 137 mmol/L (ref 135–145)
TOTAL PROTEIN: 6.3 g/dL — AB (ref 6.5–8.1)

## 2018-04-06 LAB — PHOSPHORUS: Phosphorus: 3.4 mg/dL (ref 2.5–4.6)

## 2018-04-06 LAB — GLUCOSE, CAPILLARY
GLUCOSE-CAPILLARY: 178 mg/dL — AB (ref 70–99)
Glucose-Capillary: 151 mg/dL — ABNORMAL HIGH (ref 70–99)
Glucose-Capillary: 165 mg/dL — ABNORMAL HIGH (ref 70–99)

## 2018-04-06 LAB — MAGNESIUM: Magnesium: 1.9 mg/dL (ref 1.7–2.4)

## 2018-04-06 MED ORDER — OXYCODONE HCL 5 MG PO TABS
10.0000 mg | ORAL_TABLET | ORAL | Status: DC | PRN
  Administered 2018-04-06 – 2018-04-10 (×18): 10 mg via ORAL
  Filled 2018-04-06 (×20): qty 2

## 2018-04-06 MED ORDER — IPRATROPIUM-ALBUTEROL 0.5-2.5 (3) MG/3ML IN SOLN
3.0000 mL | RESPIRATORY_TRACT | Status: DC | PRN
  Administered 2018-04-07: 3 mL via RESPIRATORY_TRACT
  Filled 2018-04-06: qty 3

## 2018-04-06 MED ORDER — NICOTINE 14 MG/24HR TD PT24
14.0000 mg | MEDICATED_PATCH | Freq: Every day | TRANSDERMAL | Status: DC
  Administered 2018-04-06 – 2018-04-10 (×5): 14 mg via TRANSDERMAL
  Filled 2018-04-06 (×5): qty 1

## 2018-04-06 NOTE — Consult Note (Signed)
PHARMACY ELECTROLYTE CONSULT  Assessment:  Pharmacy consulted to replace electolytes in tis 57 year old male w/ ETOH abuse  K= 4.2 Mg= 1.9 Phos= 3.4  Goal: Electrolyte WNL  Plan:  11/30 No electrolyte replacement needed at this time.   Will F/U with am labs and continue to replace electroytes as needed.    Gardner CandleSheema M Roxanne Panek, PharmD, BCPS Clinical Pharmacist 04/06/2018 7:50 AM

## 2018-04-06 NOTE — Progress Notes (Signed)
[facility] [place] at [facility]   PATIENT NAME: Kurt SimmerJohn Horton    MR#:  [id number]  DATE OF BIRTH:  11/21/1960  SUBJECTIVE:  CHIEF COMPLAINT: Patient is more alert today and reporting severe pain in his shoulder and back which is chronic and takes oxycodone at home still jittery  REVIEW OF SYSTEMS:   Review of system   Constitutional: Negative for chills and fever.  HENT: Negative for congestion and sore throat.   Eyes: Negative for blurred vision and double vision.  Respiratory: Negative for cough and shortness of breath.   Cardiovascular: Negative for chest pain.  Gastrointestinal: Negative for nausea and vomiting.  Genitourinary: Negative for dysuria and frequency.  Musculoskeletal: Chronic back pain Neurological: Negative for dizziness and headaches.  Psychiatric/Behavioral: Negative for depression. DRUG ALLERGIES:   Allergies  Allergen Reactions  . Nsaids Other (See Comments)    Duodenal ulcers, GI bleeding  . Other Other (See Comments)    Patient states he can't take any "mycin" medications because myasthenia gravis.    VITALS:  Blood pressure 105/72, pulse 83, temperature 98.2 F (36.8 C), temperature source Oral, resp. rate 20, height 5\' 11"  (1.803 m), weight 81.9 kg, SpO2 98 %.  PHYSICAL EXAMINATION:  GENERAL:  57 y.o.-year-old patient lying in the bed with no acute distress.  EYES: Pupils equal, round, reactive to light and accommodation. No scleral icterus HEENT: Head atraumatic, normocephalic. Oropharynx and nasopharynx clear.  NECK:  Supple, no jugular venous distention. No thyroid enlargement, no tenderness.  LUNGS: Normal breath sounds bilaterally, no wheezing, rales,rhonchi or crepitation. No use of accessory muscles of respiration.  CARDIOVASCULAR: S1, S2 normal. No murmurs, rubs, or gallops.  ABDOMEN: Soft, nontender, nondistended. Bowel sounds present.  EXTREMITIES: No pedal edema, cyanosis, or clubbing.  NEUROLOGIC:  Delirious sensation intact. Gait not checked.  PSYCHIATRIC: The patient is awake, alert and oriented x3 SKIN: No obvious rash, lesion, or ulcer.    LABORATORY PANEL:   CBC Recent Labs  Lab 04/06/18 0453  WBC 6.5  HGB 12.5*  HCT 40.7  PLT 167   ------------------------------------------------------------------------------------------------------------------  Chemistries  Recent Labs  Lab 04/06/18 0453  NA 137  K 4.2  CL 105  CO2 25  GLUCOSE 182*  BUN 17  CREATININE 0.86  CALCIUM 9.0  MG 1.9  AST 40  ALT 33  ALKPHOS 59  BILITOT 0.8   ------------------------------------------------------------------------------------------------------------------  Cardiac Enzymes No results for input(s): TROPONINI in the last 168 hours. ------------------------------------------------------------------------------------------------------------------  RADIOLOGY:  No results found.  EKG:   Orders placed or performed during the hospital encounter of 02/03/18  . ED EKG  . ED EKG  . EKG 12-Lead  . EKG 12-Lead    ASSESSMENT AND PLAN:   A/P: 4M PMHx EtOH  Acute metabolic encephalopathy: Secondary to alcohol withdrawal Patient is delirious ciwa protocol Seen and evaluated by psychiatry who is recommending standing Librium 50 mg 3 times daily, tapering plans per psychiatry; Librium 25 mg 4 times daily as needed for CIWA >8 -Recommending Ativan 1 mg every 6 hours for ciwa greater than 15 Librium, thiamine, folate, MVI Patient will be benefited with outpatient AA -Recommending to continue BuSpar 15 mg twice daily for substance-induced mood disorder.  Patient will be assessed for depression and anxiety when she is more awake and alert Sitter for safety  Hypokalemia Repleted Potassium at 4.2 magnesium at 1.9  Chronic atrial fibrillation Rate controlled.  Continue home medication aspirin 325 mg, amiodarone and Cardizem  Essential hypertension continue home  medication  metoprolol and Cardizem and titrate as needed  Chronic low back pain resume and continue oxycodone at home dose as needed   Diabetes-insulin sliding scale  Thrombocytopenia in the setting of alcohol abuse No active bleeding or bruising.  Monitor platelet count closely.  Today's platelet count is at 1 67,000    -DVT PPx: Lovenox.  If platelet count is greater than 100,000 -Code status: Full code. -Disposition: Anticipating home, but as reported by the staff patient was living with dad and dad is refusing to take him home.  Will consult social worker     All the records are reviewed and case discussed with Care Management/Social Workerr. Management plans discussed with the patient, family and they are in agreement.  CODE STATUS: FC  TOTAL TIME TAKING CARE OF THIS PATIENT: 35 minutes.   POSSIBLE D/C IN 2 DAYS, DEPENDING ON CLINICAL CONDITION.  Note: This dictation was prepared with Dragon dictation along with smaller phrase technology. Any transcriptional errors that result from this process are unintentional.   Ramonita Lab M.D on 04/06/2018 at 3:07 PM  Between 7am to 6pm - Pager - [phone] After 6pm go to [email or web address] - password [email or web address] [facility]  [place] [facility]  Office  [phone]  CC: Primary care physician; [name], MD

## 2018-04-06 NOTE — Plan of Care (Signed)
  Problem: Education: Goal: Knowledge of General Education information will improve Description Including pain rating scale, medication(s)/side effects and non-pharmacologic comfort measures Outcome: Not Progressing Note:  Patient once again requesting pain medication(s) just about every time a staff member walks in the room. Dr. Amado CoeGouru did order a PO pain med PRN to administer to patient this afternoon. Will continue to monitor patient's pain level. Jari FavreSteven M [facility]

## 2018-04-07 DIAGNOSIS — F1994 Other psychoactive substance use, unspecified with psychoactive substance-induced mood disorder: Secondary | ICD-10-CM

## 2018-04-07 LAB — GLUCOSE, CAPILLARY
Glucose-Capillary: 127 mg/dL — ABNORMAL HIGH (ref 70–99)
Glucose-Capillary: 129 mg/dL — ABNORMAL HIGH (ref 70–99)
Glucose-Capillary: 133 mg/dL — ABNORMAL HIGH (ref 70–99)
Glucose-Capillary: 156 mg/dL — ABNORMAL HIGH (ref 70–99)

## 2018-04-07 LAB — PHOSPHORUS: Phosphorus: 5.3 mg/dL — ABNORMAL HIGH (ref 2.5–4.6)

## 2018-04-07 LAB — POTASSIUM: Potassium: 4.4 mmol/L (ref 3.5–5.1)

## 2018-04-07 LAB — MAGNESIUM: Magnesium: 1.7 mg/dL (ref 1.7–2.4)

## 2018-04-07 MED ORDER — SODIUM CHLORIDE 0.9 % IV SOLN
INTRAVENOUS | Status: DC
  Administered 2018-04-07 – 2018-04-09 (×3): via INTRAVENOUS

## 2018-04-07 MED ORDER — SODIUM CHLORIDE 0.9 % IV BOLUS
1000.0000 mL | Freq: Once | INTRAVENOUS | Status: AC
  Administered 2018-04-07: 1000 mL via INTRAVENOUS

## 2018-04-07 MED ORDER — DIAZEPAM 2 MG PO TABS
2.0000 mg | ORAL_TABLET | Freq: Once | ORAL | Status: AC
  Administered 2018-04-07: 2 mg via ORAL
  Filled 2018-04-07: qty 1

## 2018-04-07 NOTE — Progress Notes (Signed)
Spoke with dr. Amado Coegouru to make aware bp remain low 87/51 with map of 71 per md hold all schedule bp meds and anxiety meds. Okay to give scheduled amiodarone. Will continue to monitor

## 2018-04-07 NOTE — Clinical Social Work Note (Signed)
Clinical Social Work Assessment  Patient Details  Name: Kurt Horton MRN: [id number] Date of Birth: 1960-06-01  Date of referral:  04/07/18               Reason for consult:  Family Concerns, Housing Concerns/Homelessness, Substance Use/ETOH Abuse                Permission sought to share information with:  Family Supports Permission granted to share information::  Yes, Verbal Permission Granted  Name::     Father : Megan Salonatrick Horton [phone] ( Hold off contacting him for now)  Agency::     Relationship::     Contact Information:     Housing/Transportation Living arrangements for the past 2 months:  Single Family Home Source of Information:  Patient Patient Interpreter Needed:   NO Criminal Activity/Legal Involvement Pertinent to Current Situation/Hospitalization:  No - Comment as needed Significant Relationships:  Other Family Members, Parents Lives with:  Parents Do you feel safe going back to the place where you live?  Yes(Dont think my father wants me back) Need for family participation in patient care:  No (Coment)  Care giving concerns:  Patient understands he needs help with his alcohol issues   Office managerocial Worker assessment / plan:  LCSW introduced myself and explained the purpose of this visit was to complete and assessment and to see what the patient needs while here in treatment. 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 He reports he has been living with his dad to save money- He has 2200.00 month of SSDI and at length LCSW reviewed some housing options ( paying rent for trailer, apartment, moving in with brother, boarding house.) LCSW also reviewed if he will consider treatment. He reports he would [place] [facility] [place] ( thinking about this he will have to pay 1300.00 co-pay and take an UBER to get there. LCSW reviewed his ADL's and he does  struggle with walking and pain issues and he reports he struggles often.LCSW called DSS [place] to see if he can have his medicaid re-instated.  Employment status:  Disabled (Comment on whether or not currently receiving Disability)(Received 2200.00 SSDI) Insurance information:  Self Pay (Medicaid Pending)(He reports it ran out) PT Recommendations:  Not assessed at this time Information / Referral to community resources:  Residential Substance Abuse Treatment Options, Shelter  Patient/Family's Response to care:  He agrees he needs help   Patient/Family's Understanding of and Emotional Response to Diagnosis, Current Treatment, and Prognosis:  He agrees to get help Emotional Assessment Appearance:  Appears stated age Attitude/Demeanor/Rapport:  Sedated Affect (typically observed):  Calm, Pleasant Orientation:  Oriented to Self, Oriented to Situation, Oriented to Place Alcohol / Substance use:  Alcohol Use Psych involvement (Current and /or in the community):  No (Comment)  Discharge Needs  Concerns to be addressed:  Homelessness, Care Coordination, Substance Abuse Concerns Readmission within the last 30 days:  No Current discharge risk:  None Barriers to Discharge:  Homeless with medical needs   Cheron SchaumannBandi, Ardith Lewman M, LCSW 04/07/2018, 3:38 PM

## 2018-04-07 NOTE — Progress Notes (Signed)
[facility] [place] [facility]   PATIENT NAME: Nilda SimmerJohn Lapenna    MR#:  [id number]  DATE OF BIRTH:  February 01, 1961  SUBJECTIVE:  CHIEF COMPLAINT: Patient is alert today and reporting severe pain in his shoulder and back and requesting more pain medicines.  Patient is very hypotensive and how to hold all his antihypertensives today.  Started on IV fluids.  Patient's pain seems to be out of proportion to his clinical appearance REVIEW OF SYSTEMS:   Review of system   Constitutional: Negative for chills and fever.  HENT: Negative for congestion and sore throat.   Eyes: Negative for blurred vision and double vision.  Respiratory: Negative for cough and shortness of breath.   Cardiovascular: Negative for chest pain.  Gastrointestinal: Negative for nausea and vomiting.  Genitourinary: Negative for dysuria and frequency.  Musculoskeletal: Chronic back pain Neurological: Negative for dizziness and headaches.  Psychiatric/Behavioral: Negative for depression. DRUG ALLERGIES:   Allergies  Allergen Reactions  . Nsaids Other (See Comments)    Duodenal ulcers, GI bleeding  . Other Other (See Comments)    Patient states he can't take any "mycin" medications because myasthenia gravis.    VITALS:  Blood pressure 115/65, pulse 77, temperature 98 F (36.7 C), resp. rate 18, height 5\' 11"  (1.803 m), weight 81.9 kg, SpO2 98 %.  PHYSICAL EXAMINATION:  GENERAL:  57 y.o.-year-old patient lying in the bed with no acute distress.  Clinically looks comfortable EYES: Pupils equal, round, reactive to light and accommodation. No scleral icterus HEENT: Head atraumatic, normocephalic. Oropharynx and nasopharynx clear.  NECK:  Supple, no jugular venous distention. No thyroid enlargement, no tenderness.  LUNGS: Normal breath sounds bilaterally, no wheezing, rales,rhonchi or crepitation. No use of accessory muscles of respiration.  CARDIOVASCULAR: S1, S2 normal. No murmurs, rubs, or  gallops.  ABDOMEN: Soft, nontender, nondistended. Bowel sounds present.  EXTREMITIES: No pedal edema, cyanosis, or clubbing.  NEUROLOGIC: Delirious sensation intact. Gait not checked.  PSYCHIATRIC: The patient is awake, alert and oriented x3 SKIN: No obvious rash, lesion, or ulcer.    LABORATORY PANEL:   CBC Recent Labs  Lab 04/06/18 0453  WBC 6.5  HGB 12.5*  HCT 40.7  PLT 167   ------------------------------------------------------------------------------------------------------------------  Chemistries  Recent Labs  Lab 04/06/18 0453 04/07/18 0544  NA 137  --   K 4.2 4.4  CL 105  --   CO2 25  --   GLUCOSE 182*  --   BUN 17  --   CREATININE 0.86  --   CALCIUM 9.0  --   MG 1.9 1.7  AST 40  --   ALT 33  --   ALKPHOS 59  --   BILITOT 0.8  --    ------------------------------------------------------------------------------------------------------------------  Cardiac Enzymes No results for input(s): TROPONINI in the last 168 hours. ------------------------------------------------------------------------------------------------------------------  RADIOLOGY:  No results found.  EKG:   Orders placed or performed during the hospital encounter of 02/03/18  . ED EKG  . ED EKG  . EKG 12-Lead  . EKG 12-Lead    ASSESSMENT AND PLAN:   A/P: 85M PMHx EtOH  Acute metabolic encephalopathy: Secondary to alcohol withdrawal Patient is delirious ciwa protocol Seen and evaluated by psychiatry who is recommending standing Librium 50 mg 3 times daily, tapering plans per psychiatry; Librium 25 mg 4 times daily as needed for CIWA >8 -Recommending Ativan 1 mg every 6 hours for ciwa greater than 15 Librium, thiamine, folate, MVI Patient will be benefited with outpatient AA -  Recommending to continue BuSpar 15 mg twice daily for substance-induced mood disorder.  Patient will be assessed by psych  for depression and anxiety as he is more awake and alert Sitter  dced  Hypokalemia Repleted Potassium at 4.4 magnesium at 1.7  Chronic atrial fibrillation Rate controlled.  Continue home medication aspirin 325 mg, amiodarone and Cardizem  Essential hypertension holding home medication metoprolol and Cardizem secondary to hypotension.  Patient is on IV fluids  Chronic low back pain resume and continue oxycodone at home dose as needed   Diabetes-insulin sliding scale  Thrombocytopenia in the setting of alcohol abuse No active bleeding or bruising.  Monitor platelet count closely.  Today's platelet count is at 1 67,000    -DVT PPx: Lovenox.  If platelet count is greater than 100,000 -Code status: Full code. -Disposition: Anticipating home, but patient does not feel comfortable to stay with his dad , would rather live with his brothers, he will discuss with his brothers per social worker     All the records are reviewed and case discussed with Care Management/Social Workerr. Management plans discussed with the patient, family and they are in agreement.  CODE STATUS: FC  TOTAL TIME TAKING CARE OF THIS PATIENT: 35 minutes.   POSSIBLE D/C IN 2 DAYS, DEPENDING ON CLINICAL CONDITION.  Note: This dictation was prepared with Dragon dictation along with smaller phrase technology. Any transcriptional errors that result from this process are unintentional.   Ramonita Lab M.D on 04/07/2018 at 3:29 PM  Between 7am to 6pm - Pager - [phone] After 6pm go to [email or web address] - password [email or web address] [facility]  [place] [facility]  Office  [phone]  CC: Primary care physician; [name], MD

## 2018-04-07 NOTE — Consult Note (Addendum)
PHARMACY ELECTROLYTE CONSULT  Assessment:  Pharmacy consulted to replace electolytes in tis 57 year old male w/ ETOH abuse  K= 4.4 Mg= 1.7 Phos= 5.3 (H)  Goal: Electrolyte WNL except phos which is slightly high. We will rely on time and furosemide to correct phos for now.   Plan:  12/1 no additional supplement indicated at this time.   Will F/U with am labs and continue to replace electroytes as needed.    Carola FrostNathan A Leshia Kope, PharmD, BCPS Clinical Pharmacist 04/07/2018 8:09 AM

## 2018-04-07 NOTE — Consult Note (Addendum)
[facility] Face-to-Face Psychiatry Consult   Reason for Consult:  Alcohol use disorder and withdrawal Referring Physician:  Dr. Elveria Royals, Dr. Levander Campion Patient Identification: Kurt Horton MRN:  [id number] Principal Diagnosis: <principal problem not specified> Diagnosis:  Active Problems:   Alcohol withdrawal (HCC)   Total Time spent with patient: 40 min  Subjective:   Kurt Horton is a 57 y.o. male patient with severe alcohol use disorder,presesnted in the hospital with acute intoxication.  BAC was 304 on arrival. He has been in ED frequently in the past 6 months at least.   Pt seen for follow up. Pt reportedly was delirious for last few days, now coming out of delirium, Pt ox3. Pt admits depression and anxiety  for last 3 yrs after divorce, reports worsening depression for last several weeks , endorses haplessness, helplessness, states he cannot go back to his dad's house upon d/c, admits drinking to help sadness. Denies AVH at this time. Denies manic symptoms. Pt reports he has been on celexa for about 1 yr but not helping, also tried zoloft in the past, wants to try something different. States he was in rehab 3 times, including at [facility]   Past Psychiatric History: severe alcohol use disorder, per Epic, he has at least 10 ED or hospital visits for alcohol intoxication for the past 6 months. At times, he visited ED 2-3x a week.   Risk to Self:   Risk to Others:   Prior Inpatient Therapy:   Prior Outpatient Therapy:    Past Medical History:  Past Medical History:  Diagnosis Date  . A-fib (HCC)   . Alcohol abuse   . Alcohol withdrawal (HCC) 11/12/2016  . Diabetes mellitus without complication (HCC)   . DJD (degenerative joint disease)   . Hypertension   . Myasthenia gravis (HCC)   . Renal disorder     Past Surgical History:  Procedure Laterality Date  . AMPUTATION TOE Left 12/29/2015   Procedure: AMPUTATION TOE;  Surgeon: Gwyneth Revels, DPM;  Location: [facility] ORS;  Service:  Podiatry;  Laterality: Left;  . ESOPHAGOGASTRODUODENOSCOPY N/A 02/05/2018   Procedure: ESOPHAGOGASTRODUODENOSCOPY (EGD);  Surgeon: Pasty Spillers, MD;  Location: [facility] ENDOSCOPY;  Service: Endoscopy;  Laterality: N/A;  . PERIPHERAL VASCULAR CATHETERIZATION N/A 12/27/2015   Procedure: Lower Extremity Angiography;  Surgeon: Annice Needy, MD;  Location: [facility] INVASIVE CV LAB;  Service: Cardiovascular;  Laterality: N/A;  . right hip surgery    . right shoulder surgery     Family History:  Family History  Problem Relation Age of Onset  . Rheum arthritis Father    Family Psychiatric  History: unknown Social History:  Social History   Substance and Sexual Activity  Alcohol Use Yes   Comment: Pt had tequila and rum, at least a 5th or a pint a day     Social History   Substance and Sexual Activity  Drug Use No    Social History   Socioeconomic History  . Marital status: Divorced    Spouse name: Not on file  . Number of children: Not on file  . Years of education: Not on file  . Highest education level: Not on file  Occupational History  . Not on file  Social Needs  . Financial resource strain: Not on file  . Food insecurity:    Worry: Not on file    Inability: Not on file  . Transportation needs:    Medical: Not on file    Non-medical: Not  on file  Tobacco Use  . Smoking status: Current Every Day Smoker    Packs/day: 0.50    Types: Cigarettes  . Smokeless tobacco: Never Used  Substance and Sexual Activity  . Alcohol use: Yes    Comment: Pt had tequila and rum, at least a 5th or a pint a day  . Drug use: No  . Sexual activity: Not on file  Lifestyle  . Physical activity:    Days per week: Not on file    Minutes per session: Not on file  . Stress: Not on file  Relationships  . Social connections:    Talks on phone: Not on file    Gets together: Not on file    Attends religious service: Not on file    Active member of club or organization: Not on file    Attends  meetings of clubs or organizations: Not on file    Relationship status: Not on file  Other Topics Concern  . Not on file  Social History Narrative   Live with Parents, ambulates with a cane. Falls lately.   Additional Social History:    Allergies:   Allergies  Allergen Reactions  . Nsaids Other (See Comments)    Duodenal ulcers, GI bleeding  . Other Other (See Comments)    Patient states he can't take any "mycin" medications because myasthenia gravis.    Labs:  Results for orders placed or performed during the hospital encounter of 04/01/18 (from the past 48 hour(s))  Glucose, capillary     Status: Abnormal   Collection Time: 04/05/18  8:53 PM  Result Value Ref Range   Glucose-Capillary 108 (H) 70 - 99 mg/dL  Magnesium     Status: None   Collection Time: 04/06/18  4:53 AM  Result Value Ref Range   Magnesium 1.9 1.7 - 2.4 mg/dL    Comment: Performed at [facility], [address] [place], [place] [address]  Phosphorus     Status: None   Collection Time: 04/06/18  4:53 AM  Result Value Ref Range   Phosphorus 3.4 2.5 - 4.6 mg/dL    Comment: Performed at [facility], [address] [place], [place] [address]  CBC     Status: Abnormal   Collection Time: 04/06/18  4:53 AM  Result Value Ref Range   WBC 6.5 4.0 - 10.5 K/uL   RBC 4.60 4.22 - 5.81 MIL/uL   Hemoglobin 12.5 (L) 13.0 - 17.0 g/dL   HCT 09.8 11.9 - 14.7 %   MCV 88.5 80.0 - 100.0 fL   MCH 27.2 26.0 - 34.0 pg   MCHC 30.7 30.0 - 36.0 g/dL   RDW 82.9 (H) 56.2 - 13.0 %   Platelets 167 150 - 400 K/uL   nRBC 0.0 0.0 - 0.2 %    Comment: Performed at [facility], [address] [place], [place] [address]  Comprehensive metabolic panel     Status: Abnormal   Collection Time: 04/06/18  4:53 AM  Result Value Ref Range   Sodium 137 135 - 145 mmol/L   Potassium 4.2 3.5 - 5.1 mmol/L   Chloride 105 98 - 111 mmol/L   CO2 25 22 - 32 mmol/L   Glucose, Bld 182 (H) 70 - 99 mg/dL   BUN 17 6 -  20 mg/dL   Creatinine, Ser 4.69 0.61 - 1.24 mg/dL   Calcium 9.0 8.9 - 62.9 mg/dL   Total Protein 6.3 (L) 6.5 - 8.1 g/dL  Albumin 3.8 3.5 - 5.0 g/dL   AST 40 15 - 41 U/L   ALT 33 0 - 44 U/L   Alkaline Phosphatase 59 38 - 126 U/L   Total Bilirubin 0.8 0.3 - 1.2 mg/dL   GFR calc non Af Amer >60 >60 mL/min   GFR calc Af Amer >60 >60 mL/min   Anion gap 7 5 - 15    Comment: Performed at [facility] [address] [place], [place] [address]  Glucose, capillary     Status: Abnormal   Collection Time: 04/06/18 12:02 PM  Result Value Ref Range   Glucose-Capillary 151 (H) 70 - 99 mg/dL  Glucose, capillary     Status: Abnormal   Collection Time: 04/06/18  5:26 PM  Result Value Ref Range   Glucose-Capillary 165 (H) 70 - 99 mg/dL  Glucose, capillary     Status: Abnormal   Collection Time: 04/06/18  9:16 PM  Result Value Ref Range   Glucose-Capillary 178 (H) 70 - 99 mg/dL  Potassium     Status: None   Collection Time: 04/07/18  5:44 AM  Result Value Ref Range   Potassium 4.4 3.5 - 5.1 mmol/L    Comment: Performed at [facility], [address], [place], [place] [address]  Magnesium     Status: None   Collection Time: 04/07/18  5:44 AM  Result Value Ref Range   Magnesium 1.7 1.7 - 2.4 mg/dL    Comment: Performed at [facility], [address], [place], [place] [address]  Phosphorus     Status: Abnormal   Collection Time: 04/07/18  5:44 AM  Result Value Ref Range   Phosphorus 5.3 (H) 2.5 - 4.6 mg/dL    Comment: Performed at [facility], [address] [place], [place] [address]  Glucose, capillary     Status: Abnormal   Collection Time: 04/07/18  8:27 AM  Result Value Ref Range   Glucose-Capillary 127 (H) 70 - 99 mg/dL  Glucose, capillary     Status: Abnormal   Collection Time: 04/07/18 11:24 AM  Result Value Ref Range   Glucose-Capillary 129 (H) 70 - 99 mg/dL    Current Facility-Administered Medications  Medication Dose Route  Frequency Provider Last Rate Last Dose  . 0.9 %  sodium chloride infusion   Intravenous Continuous Gouru, Aruna, MD 50 mL/hr at 04/07/18 0829    . amiodarone (PACERONE) tablet 200 mg  200 mg Oral Daily Barbaraann Rondo, MD   200 mg at 04/07/18 1103  . aspirin EC tablet 325 mg  325 mg Oral Daily Barbaraann Rondo, MD   325 mg at 04/07/18 1036  . bisacodyl (DULCOLAX) EC tablet 5 mg  5 mg Oral Daily PRN Barbaraann Rondo, MD      . busPIRone (BUSPAR) tablet 15 mg  15 mg Oral BID Barbaraann Rondo, MD   15 mg at 04/07/18 1239  . chlordiazePOXIDE (LIBRIUM) capsule 25 mg  25 mg Oral QID PRN He, Jun, MD      . chlordiazePOXIDE (LIBRIUM) capsule 50 mg  50 mg Oral TID He, Jun, MD   50 mg at 04/07/18 1239  . diltiazem (CARDIZEM CD) 24 hr capsule 180 mg  180 mg Oral Daily Barbaraann Rondo, MD   Stopped at 04/07/18 1054  . enoxaparin (LOVENOX) injection 40 mg  40 mg Subcutaneous Q24H Gouru, Aruna, MD   40 mg at 04/06/18 2152  . folic acid (FOLVITE) tablet 1 mg  1 mg Oral Daily Sridharan,  Prasanna, MD   1 mg at 04/07/18 1036  . furosemide (LASIX) tablet 40 mg  40 mg Oral Daily Barbaraann RondoSridharan, Prasanna, MD   Stopped at 04/07/18 1054  . gabapentin (NEURONTIN) capsule 600 mg  600 mg Oral TID Barbaraann RondoSridharan, Prasanna, MD   600 mg at 04/07/18 1103  . insulin aspart (novoLOG) injection 0-15 Units  0-15 Units Subcutaneous TID WC Barbaraann RondoSridharan, Prasanna, MD   2 Units at 04/07/18 1232  . insulin aspart (novoLOG) injection 0-5 Units  0-5 Units Subcutaneous QHS Sridharan, Prasanna, MD      . ipratropium-albuterol (DUONEB) 0.5-2.5 (3) MG/3ML nebulizer solution 3 mL  3 mL Nebulization Q4H PRN Oralia ManisWillis, David, MD   3 mL at 04/07/18 0009  . lisinopril (PRINIVIL,ZESTRIL) tablet 10 mg  10 mg Oral Daily Barbaraann RondoSridharan, Prasanna, MD   Stopped at 04/07/18 1054  . loperamide (IMODIUM) capsule 2 mg  2 mg Oral PRN Barbaraann RondoSridharan, Prasanna, MD   2 mg at 04/05/18 1808  . metoprolol succinate (TOPROL-XL) 24 hr tablet 50 mg  50 mg Oral Daily  Barbaraann RondoSridharan, Prasanna, MD   Stopped at 04/07/18 1054  . multivitamin with minerals tablet 1 tablet  1 tablet Oral Daily Barbaraann RondoSridharan, Prasanna, MD   1 tablet at 04/07/18 1036  . mycophenolate (CELLCEPT) capsule 1,000 mg  1,000 mg Oral QHS Barbaraann RondoSridharan, Prasanna, MD   1,000 mg at 04/06/18 2153  . mycophenolate (CELLCEPT) capsule 500 mg  500 mg Oral Vinnie LangtonBH-q7a Sridharan, Bosie HelperPrasanna, MD   500 mg at 04/07/18 0830  . nicotine (NICODERM CQ - dosed in mg/24 hours) patch 14 mg  14 mg Transdermal Daily Oralia ManisWillis, David, MD   14 mg at 04/07/18 1055  . ondansetron (ZOFRAN) tablet 4 mg  4 mg Oral Q6H PRN Barbaraann RondoSridharan, Prasanna, MD       Or  . ondansetron (ZOFRAN) injection 4 mg  4 mg Intravenous Q6H PRN Barbaraann RondoSridharan, Prasanna, MD      . oxyCODONE (Oxy IR/ROXICODONE) immediate release tablet 10 mg  10 mg Oral Q4H PRN Gouru, Aruna, MD   10 mg at 04/07/18 1238  . senna-docusate (Senokot-S) tablet 1 tablet  1 tablet Oral QHS PRN Barbaraann RondoSridharan, Prasanna, MD      . sodium chloride 0.9 % bolus 1,000 mL  1,000 mL Intravenous Once Irean HongSung, Jade J, MD      . thiamine (VITAMIN B-1) tablet 100 mg  100 mg Oral Daily Barbaraann RondoSridharan, Prasanna, MD   100 mg at 04/07/18 1036   Or  . thiamine (B-1) injection 100 mg  100 mg Intravenous Daily Barbaraann RondoSridharan, Prasanna, MD      . traZODone (DESYREL) tablet 50 mg  50 mg Oral QHS PRN Oralia ManisWillis, David, MD   50 mg at 04/06/18 2153    Musculoskeletal: Strength & Muscle Tone: within normal limits Gait & Station: unable to asess Patient leans: N/A  Psychiatric Specialty Exam: Physical Exam  Nursing note and vitals reviewed.   ROS  Blood pressure 115/65, pulse 77, temperature 98 F (36.7 C), resp. rate 18, height 5\' 11"  (1.803 m), weight 81.9 kg, SpO2 98 %.Body mass index is 25.17 kg/m.  General Appearance: Disheveled  Eye Contact:  fair  Speech:  normal  Volume:  Normal  Mood: depressed  Affect:  congruent  Thought Process:  Goal directed  Orientation:  ox3  Thought Content:  About life stressors  Suicidal  Thoughts:  No  Homicidal Thoughts:  No  Memory:  Immediate;   Fair Recent;   Fair Remote;   Fair  Judgement:  Poor  Insight:  Fair  Psychomotor Activity:  decreased  Concentration:  fair  Recall:  Poor  Fund of Knowledge:  Poor  Language:  Poor      AIMS (if indicated):     Assets:  Desire for Improvement  ADL's:  Intact  Cognition:  WNL  Sleep:        Treatment Plan Summary: Daily contact with patient to assess and evaluate symptoms and progress in treatment and Medication management   57yo WM with severe alcohol use disorder, multiple frequent visits to hospital ED for alcohol intoxication and detox. Pt coming out of delirium, Pt depressed, and anxious. Denies suicidal ideation/intent./plan.  Plan-  Pt coming out of delirium- taper  librium , cont thiamine. Depressive disorder vs substance induced mood disorder- Start Effexor  XR 75 mg daily, for depression and anxiety, may increase dose to 150 mg if needed and tolerated. Continue buspar for anxiety.   Obtain TSH.    Disposition: Pt does not  not meet criteria for inpatient psych tx, Refer to rehab for alcohol.   Beverly Sessions, MD 04/07/2018 4:18 PM

## 2018-04-07 NOTE — Progress Notes (Signed)
LCSW met with patient and discussed his current housing dilemma ( unable to stay with his dad) He will try and reach out to his brothers. 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 He will travel by [place] or [place] to [facility].  [employer] LCSW [phone]

## 2018-04-07 NOTE — Progress Notes (Signed)
Spoke with dr. Amado Coegouru to make aware patient blood pressure remains low sbp 80's. Patient does has a history of chf. Per md start on ns @50 . Will continue to monitor closely

## 2018-04-08 DIAGNOSIS — F10231 Alcohol dependence with withdrawal delirium: Secondary | ICD-10-CM

## 2018-04-08 LAB — GLUCOSE, CAPILLARY
GLUCOSE-CAPILLARY: 116 mg/dL — AB (ref 70–99)
Glucose-Capillary: 185 mg/dL — ABNORMAL HIGH (ref 70–99)
Glucose-Capillary: 133 mg/dL — ABNORMAL HIGH (ref 70–99)
Glucose-Capillary: 129 mg/dL — ABNORMAL HIGH (ref 70–99)
Glucose-Capillary: 145 mg/dL — ABNORMAL HIGH (ref 70–99)
Glucose-Capillary: 118 mg/dL — ABNORMAL HIGH (ref 70–99)

## 2018-04-08 LAB — BASIC METABOLIC PANEL
Anion gap: 7 (ref 5–15)
BUN: 18 mg/dL (ref 6–20)
CO2: 24 mmol/L (ref 22–32)
Calcium: 8.9 mg/dL (ref 8.9–10.3)
Chloride: 105 mmol/L (ref 98–111)
Creatinine, Ser: 0.92 mg/dL (ref 0.61–1.24)
GFR calc Af Amer: >60 mL/min (ref >60)
GFR calc non Af Amer: >60 mL/min (ref >60)
Glucose, Bld: 145 mg/dL — ABNORMAL HIGH (ref 70–99)
Potassium: 4.2 mmol/L (ref 3.5–5.1)
Sodium: 136 mmol/L (ref 135–145)

## 2018-04-08 LAB — PHOSPHORUS: Phosphorus: 3.7 mg/dL (ref 2.5–4.6)

## 2018-04-08 LAB — MAGNESIUM: Magnesium: 1.8 mg/dL (ref 1.7–2.4)

## 2018-04-08 MED ORDER — CHLORDIAZEPOXIDE HCL 10 MG PO CAPS: 50.0000 mg | ORAL_CAPSULE | Freq: Two times a day (BID) | ORAL | Status: DC

## 2018-04-08 MED ORDER — POLYVINYL ALCOHOL 1.4 % OP SOLN
1.0000 [drp] | OPHTHALMIC | Status: DC | PRN
  Administered 2018-04-08 – 2018-04-10 (×3): 1 [drp] via OPHTHALMIC
  Filled 2018-04-08: qty 15

## 2018-04-08 MED ORDER — CHLORDIAZEPOXIDE HCL 5 MG PO CAPS
25.0000 mg | ORAL_CAPSULE | Freq: Two times a day (BID) | ORAL | Status: DC
  Administered 2018-04-09 – 2018-04-10 (×3): 25 mg via ORAL
  Filled 2018-04-08 (×3): qty 2

## 2018-04-08 NOTE — Evaluation (Signed)
Physical Therapy Evaluation Patient Details Name: Kurt Horton MRN: [id number] DOB: [birth year] Today's Date: 04/08/2018   History of Present Illness   Pt is a 57 y.o. male patient with severe alcohol use disorder who presesnted to the hospital with acute intoxication.  BAC was 304 on arrival.  Assessment includes: acute metabolic encephalopathy on CIWA protocol, hypokalemia, A-fib, HTN, chronic LBP, DM, and thrombocytopenia.    Clinical Impression  Pt presents with deficits in strength, transfers, mobility, gait, balance, and activity tolerance.  Pt required extra time and effort with bed mobility tasks but no physical assistance.  Pt was unsteady in standing during transfers and amb but did not require physical assistance to prevent LOB.  Pt was able to amb 125' with a RW and CGA with flexed trunk posture that improved with cues.  Pt's posture, step length, and cadence all slowly improved grossly as amb progressed.  Pt will benefit from HHPT services upon discharge to safely address above deficits for decreased caregiver assistance and eventual return to PLOF.      Follow Up Recommendations Home health PT;Supervision for mobility/OOB    Equipment Recommendations  None recommended by PT    Recommendations for Other Services       Precautions / Restrictions Precautions Precautions: Fall Restrictions Weight Bearing Restrictions: No      Mobility  Bed Mobility Overal bed mobility: Modified Independent             General bed mobility comments: Extra time and effort but no physical assistance required during sup to/from sit  Transfers Overall transfer level: Needs assistance Equipment used: Rolling walker (2 wheeled) Transfers: Sit to/from Stand Sit to Stand: Min guard         General transfer comment: Somewhat unsteady upon initial stand but no physical assistance required to prevent LOB  Ambulation/Gait Ambulation/Gait assistance: Min guard Gait Distance (Feet):  125 Feet Assistive device: Rolling walker (2 wheeled) Gait Pattern/deviations: Step-through pattern;Trunk flexed;Decreased step length - right;Decreased step length - left Gait velocity: Decreased   General Gait Details: Mod verbal cues needed during amb to amb closer to the RW with upright posture; pt's general quality of gait improved during the session.  Stairs            Wheelchair Mobility    Modified Rankin (Stroke Patients Only)       Balance Overall balance assessment: Needs assistance   Sitting balance-Leahy Scale: Good     Standing balance support: Bilateral upper extremity supported Standing balance-Leahy Scale: Fair                               Pertinent Vitals/Pain Pain Assessment: 0-10 Pain Score: 9  Pain Location: LBP Pain Descriptors / Indicators: Aching;Sore Pain Intervention(s): Monitored during session;Premedicated before session    Home Living Family/patient expects to be discharged to:: Private residence Living Arrangements: Parent Available Help at Discharge: Family;Available 24 hours/day Type of Home: House Home Access: Level entry     Home Layout: One level Home Equipment: Walker - 2 wheels;Cane - single point      Prior Function Level of Independence: Independent with assistive device(s)         Comments: Mod Ind amb community distances with a SPC with occasional use of a RW, one fall in the last 6 months, Ind with ADLs     Hand Dominance        Extremity/Trunk Assessment   Upper Extremity  Assessment Upper Extremity Assessment: Generalized weakness    Lower Extremity Assessment Lower Extremity Assessment: Generalized weakness       Communication   Communication: No difficulties  Cognition Arousal/Alertness: Awake/alert Behavior During Therapy: WFL for tasks assessed/performed Overall Cognitive Status: Within Functional Limits for tasks assessed                                         General Comments      Exercises Total Joint Exercises Ankle Circles/Pumps: AROM;Both;10 reps Hip ABduction/ADduction: AROM;Both;5 reps Long Arc Quad: AROM;Both;10 reps Knee Flexion: AROM;Both;10 reps Marching in Standing: AROM;Both;10 reps;Seated;Standing   Assessment/Plan    PT Assessment Patient needs continued PT services  PT Problem List Decreased strength;Decreased activity tolerance;Decreased balance;Decreased knowledge of use of DME;Decreased mobility       PT Treatment Interventions DME instruction;Gait training;Functional mobility training;Balance training;Therapeutic exercise;Therapeutic activities;Patient/family education    PT Goals (Current goals can be found in the Care Plan section)  Acute Rehab PT Goals Patient Stated Goal: To get stronger and get back to my dad's house PT Goal Formulation: With patient Time For Goal Achievement: 04/21/18 Potential to Achieve Goals: Good    Frequency Min 2X/week   Barriers to discharge        Co-evaluation               AM-PAC PT "6 Clicks" Mobility  Outcome Measure Help needed turning from your back to your side while in a flat bed without using bedrails?: A Little Help needed moving from lying on your back to sitting on the side of a flat bed without using bedrails?: A Little Help needed moving to and from a bed to a chair (including a wheelchair)?: Total Help needed standing up from a chair using your arms (e.g., wheelchair or bedside chair)?: A Little Help needed to walk in hospital room?: A Little Help needed climbing 3-5 steps with a railing? : A Little 6 Click Score: 16    End of Session Equipment Utilized During Treatment: Gait belt Activity Tolerance: Patient tolerated treatment well Patient left: in bed;with call bell/phone within reach;with bed alarm set;Other (comment)(Pt declined up in chair) Nurse Communication: Mobility status PT Visit Diagnosis: Unsteadiness on feet (R26.81);Muscle weakness  (generalized) (M62.81);Difficulty in walking, not elsewhere classified (R26.2)    Time: [id number] PT Time Calculation (min) (ACUTE ONLY): 30 min   Charges:   PT Evaluation $PT Eval Low Complexity: 1 Low PT Treatments $Therapeutic Exercise: 8-22 mins        D. Scott Laila Myhre PT, DPT 04/08/18, 1:35 PM

## 2018-04-08 NOTE — Consult Note (Signed)
Pharmacy Electrolyte Monitoring Consult:  Pharmacy consulted to assist in monitoring and replacing electrolytes in this      Labs:   Potassium (mmol/L)  Date Value  04/08/2018 4.2   Magnesium (mg/dL)  Date Value  16/10/960412/06/2017 1.8   Phosphorus (mg/dL)  Date Value  54/09/811912/06/2017 3.7   Calcium (mg/dL)  Date Value  14/78/295612/06/2017 8.9   Albumin (g/dL)  Date Value  21/30/865711/30/2019 3.8  08/24/2016 4.2    Assessment/Plan: Pharmacy consulted to replace electolytes in tis 57 year old male w/ ETOH abuse. All electrolytes are now wnl. Pharmacy will sign off for now  Burnis Medinodney Heidy Mccubbin, PharmD Clinical Pharmacist

## 2018-04-08 NOTE — Clinical Social Work Note (Addendum)
CSW received a message from patient's father requesting a call back.  CSW attempted to contact patient's father, left a message awaiting for call back.  Patient informed weekend social worker that he would like to go to [facility] [place] possible for substance abuse treatment.  CSW contacted [facility] at [phone] and they requested clinical information be faxed to them.  CSW faxed requested information, awaiting for response back.  Ervin KnackEric R. Lamont Tant, MSW, Theresia MajorsLCSWA [phone]  04/08/2018 5:30 PM

## 2018-04-08 NOTE — Consult Note (Signed)
[facility] Face-to-Face Psychiatry Consult   Reason for Consult: Consult for this 57 year old man with alcohol abuse currently going through alcohol withdrawal delirium Referring Physician: Gouru Patient Identification: Kurt Horton MRN:  [id number] Principal Diagnosis: Alcohol withdrawal delirium (HCC) Diagnosis:  Principal Problem:   Alcohol withdrawal delirium (HCC) Active Problems:   Alcohol withdrawal (HCC)   Alcohol abuse   Total Time spent with patient: 1 hour  Subjective:   Kurt Horton is a 57 y.o. male patient admitted with "I guess all things considered I am okay".  HPI: Patient seen chart reviewed.  Patient well known from many prior encounters.  Patient has alcohol abuse and frequently comes into the hospital with seizures or delirium tremens.  He is been in the hospital for several days now.  Looks like family once again had him brought into the hospital after finding him completely passed out and having soiled himself.  Patient was awake and alert enough to have a conversation today.  He did remember me or at least he seemed to and had some orientation to his surroundings.  He admits that he is continuing to drink.  Cannot remember any time recently that he has been able to really stop.  Talked for a while about how sad he is about the things in his life that he is lost.  Denies suicidal thoughts denies any hallucinations today.  Medical history: Patient has liver damage myasthenia gravis diabetes multiple medical problems all complicating his alcohol abuse  Substance abuse history: Long-standing heavy alcohol abuse with multiple episodes of delirium tremens.  Has been referred to treatment many times and as time has gone on has been less and less compliant or agreeable  Social history: Lives with his parents.  Not able to work.  Past Psychiatric History: Has been seen many times previously pretty much always in the same context of alcohol abuse and withdrawal delirium.  History  of noncompliance with recommended outpatient treatment.  Has been treated with antidepressants in the past but it is unclear if it has even had a chance to be of any benefit given his heavy drinking.  Risk to Self:   Risk to Others:   Prior Inpatient Therapy:   Prior Outpatient Therapy:    Past Medical History:  Past Medical History:  Diagnosis Date  . A-fib (HCC)   . Alcohol abuse   . Alcohol withdrawal (HCC) 11/12/2016  . Diabetes mellitus without complication (HCC)   . DJD (degenerative joint disease)   . Hypertension   . Myasthenia gravis (HCC)   . Renal disorder     Past Surgical History:  Procedure Laterality Date  . AMPUTATION TOE Left 12/29/2015   Procedure: AMPUTATION TOE;  Surgeon: Gwyneth Revels, DPM;  Location: [facility] ORS;  Service: Podiatry;  Laterality: Left;  . ESOPHAGOGASTRODUODENOSCOPY N/A 02/05/2018   Procedure: ESOPHAGOGASTRODUODENOSCOPY (EGD);  Surgeon: Pasty Spillers, MD;  Location: [facility] ENDOSCOPY;  Service: Endoscopy;  Laterality: N/A;  . PERIPHERAL VASCULAR CATHETERIZATION N/A 12/27/2015   Procedure: Lower Extremity Angiography;  Surgeon: Annice Needy, MD;  Location: [facility] INVASIVE CV LAB;  Service: Cardiovascular;  Laterality: N/A;  . right hip surgery    . right shoulder surgery     Family History:  Family History  Problem Relation Age of Onset  . Rheum arthritis Father    Family Psychiatric  History: None Social History:  Social History   Substance and Sexual Activity  Alcohol Use Yes   Comment: Pt had tequila and rum, at least  a 5th or a pint a day     Social History   Substance and Sexual Activity  Drug Use No    Social History   Socioeconomic History  . Marital status: Divorced    Spouse name: Not on file  . Number of children: Not on file  . Years of education: Not on file  . Highest education level: Not on file  Occupational History  . Not on file  Social Needs  . Financial resource strain: Not on file  . Food insecurity:     Worry: Not on file    Inability: Not on file  . Transportation needs:    Medical: Not on file    Non-medical: Not on file  Tobacco Use  . Smoking status: Current Every Day Smoker    Packs/day: 0.50    Types: Cigarettes  . Smokeless tobacco: Never Used  Substance and Sexual Activity  . Alcohol use: Yes    Comment: Pt had tequila and rum, at least a 5th or a pint a day  . Drug use: No  . Sexual activity: Not on file  Lifestyle  . Physical activity:    Days per week: Not on file    Minutes per session: Not on file  . Stress: Not on file  Relationships  . Social connections:    Talks on phone: Not on file    Gets together: Not on file    Attends religious service: Not on file    Active member of club or organization: Not on file    Attends meetings of clubs or organizations: Not on file    Relationship status: Not on file  Other Topics Concern  . Not on file  Social History Narrative   Live with Parents, ambulates with a cane. Falls lately.   Additional Social History:    Allergies:   Allergies  Allergen Reactions  . Nsaids Other (See Comments)    Duodenal ulcers, GI bleeding  . Other Other (See Comments)    Patient states he can't take any "mycin" medications because myasthenia gravis.    Labs:  Results for orders placed or performed during the hospital encounter of 04/01/18 (from the past 48 hour(s))  Glucose, capillary     Status: Abnormal   Collection Time: 04/06/18  9:16 PM  Result Value Ref Range   Glucose-Capillary 178 (H) 70 - 99 mg/dL  Potassium     Status: None   Collection Time: 04/07/18  5:44 AM  Result Value Ref Range   Potassium 4.4 3.5 - 5.1 mmol/L    Comment: Performed at [facility], [address], [place], [place] [address]  Magnesium     Status: None   Collection Time: 04/07/18  5:44 AM  Result Value Ref Range   Magnesium 1.7 1.7 - 2.4 mg/dL    Comment: Performed at [facility], [address] [place], [place]  [address]  Phosphorus     Status: Abnormal   Collection Time: 04/07/18  5:44 AM  Result Value Ref Range   Phosphorus 5.3 (H) 2.5 - 4.6 mg/dL    Comment: Performed at [facility], [address] [place], [place] [address]  Glucose, capillary     Status: Abnormal   Collection Time: 04/07/18  8:27 AM  Result Value Ref Range   Glucose-Capillary 127 (H) 70 - 99 mg/dL  Glucose, capillary     Status: Abnormal   Collection Time: 04/07/18 11:24 AM  Result Value Ref Range  Glucose-Capillary 129 (H) 70 - 99 mg/dL  Glucose, capillary     Status: Abnormal   Collection Time: 04/07/18  4:46 PM  Result Value Ref Range   Glucose-Capillary 133 (H) 70 - 99 mg/dL  Glucose, capillary     Status: Abnormal   Collection Time: 04/07/18  9:17 PM  Result Value Ref Range   Glucose-Capillary 156 (H) 70 - 99 mg/dL  Basic metabolic panel     Status: Abnormal   Collection Time: 04/08/18  3:49 AM  Result Value Ref Range   Sodium 136 135 - 145 mmol/L   Potassium 4.2 3.5 - 5.1 mmol/L   Chloride 105 98 - 111 mmol/L   CO2 24 22 - 32 mmol/L   Glucose, Bld 145 (H) 70 - 99 mg/dL   BUN 18 6 - 20 mg/dL   Creatinine, Ser 1.610.92 0.61 - 1.24 mg/dL   Calcium 8.9 8.9 - 09.610.3 mg/dL   GFR calc non Af Amer >60 >60 mL/min   GFR calc Af Amer >60 >60 mL/min   Anion gap 7 5 - 15    Comment: Performed at [facility], [address] [place], [place] [address]  Magnesium     Status: None   Collection Time: 04/08/18  3:49 AM  Result Value Ref Range   Magnesium 1.8 1.7 - 2.4 mg/dL    Comment: Performed at [facility], [address] [place], [place] [address]  Phosphorus     Status: None   Collection Time: 04/08/18  3:49 AM  Result Value Ref Range   Phosphorus 3.7 2.5 - 4.6 mg/dL    Comment: Performed at [facility], [address] [place], [place] [address]  Glucose, capillary     Status: Abnormal   Collection Time: 04/08/18  7:46 AM  Result Value Ref Range   Glucose-Capillary 129  (H) 70 - 99 mg/dL  Glucose, capillary     Status: Abnormal   Collection Time: 04/08/18 11:45 AM  Result Value Ref Range   Glucose-Capillary 145 (H) 70 - 99 mg/dL  Glucose, capillary     Status: Abnormal   Collection Time: 04/08/18  4:29 PM  Result Value Ref Range   Glucose-Capillary 116 (H) 70 - 99 mg/dL    Current Facility-Administered Medications  Medication Dose Route Frequency Provider Last Rate Last Dose  . 0.9 %  sodium chloride infusion   Intravenous Continuous Gouru, Aruna, MD 50 mL/hr at 04/08/18 1500    . amiodarone (PACERONE) tablet 200 mg  200 mg Oral Daily Barbaraann RondoSridharan, Prasanna, MD   200 mg at 04/08/18 0927  . aspirin EC tablet 325 mg  325 mg Oral Daily Barbaraann RondoSridharan, Prasanna, MD   325 mg at 04/08/18 0926  . bisacodyl (DULCOLAX) EC tablet 5 mg  5 mg Oral Daily PRN Barbaraann RondoSridharan, Prasanna, MD      . busPIRone (BUSPAR) tablet 15 mg  15 mg Oral BID Barbaraann RondoSridharan, Prasanna, MD   15 mg at 04/08/18 0928  . chlordiazePOXIDE (LIBRIUM) capsule 25 mg  25 mg Oral QID PRN He, Jun, MD      . chlordiazePOXIDE (LIBRIUM) capsule 25 mg  25 mg Oral BID , Kevontae T, MD      . diltiazem (CARDIZEM CD) 24 hr capsule 180 mg  180 mg Oral Daily Barbaraann RondoSridharan, Prasanna, MD   180 mg at 04/08/18 0927  . enoxaparin (LOVENOX) injection 40 mg  40 mg Subcutaneous Q24H Gouru, Aruna, MD   40 mg at 04/07/18 2126  . folic  acid (FOLVITE) tablet 1 mg  1 mg Oral Daily Barbaraann Rondo, MD   1 mg at 04/08/18 0926  . furosemide (LASIX) tablet 40 mg  40 mg Oral Daily Barbaraann Rondo, MD   40 mg at 04/08/18 0926  . gabapentin (NEURONTIN) capsule 600 mg  600 mg Oral TID Barbaraann Rondo, MD   600 mg at 04/08/18 1702  . insulin aspart (novoLOG) injection 0-15 Units  0-15 Units Subcutaneous TID WC Barbaraann Rondo, MD   2 Units at 04/08/18 1208  . insulin aspart (novoLOG) injection 0-5 Units  0-5 Units Subcutaneous QHS Sridharan, Prasanna, MD      . ipratropium-albuterol (DUONEB) 0.5-2.5 (3) MG/3ML nebulizer solution 3 mL   3 mL Nebulization Q4H PRN Oralia Manis, MD   3 mL at 04/07/18 0009  . lisinopril (PRINIVIL,ZESTRIL) tablet 10 mg  10 mg Oral Daily Barbaraann Rondo, MD   10 mg at 04/08/18 0927  . loperamide (IMODIUM) capsule 2 mg  2 mg Oral PRN Barbaraann Rondo, MD   2 mg at 04/05/18 1808  . metoprolol succinate (TOPROL-XL) 24 hr tablet 50 mg  50 mg Oral Daily Barbaraann Rondo, MD   50 mg at 04/08/18 0927  . multivitamin with minerals tablet 1 tablet  1 tablet Oral Daily Barbaraann Rondo, MD   1 tablet at 04/08/18 0926  . mycophenolate (CELLCEPT) capsule 1,000 mg  1,000 mg Oral QHS Barbaraann Rondo, MD   1,000 mg at 04/07/18 2126  . mycophenolate (CELLCEPT) capsule 500 mg  500 mg Oral Vinnie Langton, Bosie Helper, MD   500 mg at 04/08/18 0809  . nicotine (NICODERM CQ - dosed in mg/24 hours) patch 14 mg  14 mg Transdermal Daily Oralia Manis, MD   14 mg at 04/08/18 0934  . ondansetron (ZOFRAN) tablet 4 mg  4 mg Oral Q6H PRN Barbaraann Rondo, MD       Or  . ondansetron (ZOFRAN) injection 4 mg  4 mg Intravenous Q6H PRN Barbaraann Rondo, MD      . oxyCODONE (Oxy IR/ROXICODONE) immediate release tablet 10 mg  10 mg Oral Q4H PRN Gouru, Aruna, MD   10 mg at 04/08/18 1349  . polyvinyl alcohol (LIQUIFILM TEARS) 1.4 % ophthalmic solution 1 drop  1 drop Both Eyes PRN Gouru, Aruna, MD   1 drop at 04/08/18 0955  . senna-docusate (Senokot-S) tablet 1 tablet  1 tablet Oral QHS PRN Barbaraann Rondo, MD      . sodium chloride 0.9 % bolus 1,000 mL  1,000 mL Intravenous Once Irean Hong, MD      . thiamine (VITAMIN B-1) tablet 100 mg  100 mg Oral Daily Barbaraann Rondo, MD   100 mg at 04/08/18 4098   Or  . thiamine (B-1) injection 100 mg  100 mg Intravenous Daily Barbaraann Rondo, MD      . traZODone (DESYREL) tablet 50 mg  50 mg Oral QHS PRN Oralia Manis, MD   50 mg at 04/06/18 2153    Musculoskeletal: Strength & Muscle Tone: decreased Gait & Station: unable to stand Patient leans:  N/A  Psychiatric Specialty Exam: Physical Exam  Nursing note and vitals reviewed. Constitutional: He appears well-developed.  HENT:  Head: Normocephalic and atraumatic.  Eyes: Pupils are equal, round, and reactive to light. Conjunctivae are normal.  Neck: Normal range of motion.  Cardiovascular: Regular rhythm and normal heart sounds.  Respiratory: Effort normal. No respiratory distress.  GI: Soft.  Musculoskeletal: Normal range of motion.  Neurological: He is alert.  Skin: Skin  is warm and dry.  Psychiatric: His affect is blunt. His speech is delayed. He is slowed. He expresses impulsivity. He exhibits a depressed mood. He expresses no homicidal and no suicidal ideation. He exhibits abnormal recent memory.    Review of Systems  Constitutional: Negative.   HENT: Negative.   Eyes: Negative.   Respiratory: Negative.   Cardiovascular: Negative.   Gastrointestinal: Negative.   Musculoskeletal: Negative.   Skin: Negative.   Neurological: Negative.   Psychiatric/Behavioral: Positive for memory loss and substance abuse. Negative for depression, hallucinations and suicidal ideas. The patient is nervous/anxious and has insomnia.     Blood pressure 107/64, pulse 86, temperature 99.5 F (37.5 C), temperature source Oral, resp. rate 16, height 5\' 11"  (1.803 m), weight 81.9 kg, SpO2 100 %.Body mass index is 25.17 kg/m.  General Appearance: Disheveled  Eye Contact:  Fair  Speech:  Slow  Volume:  Decreased  Mood:  Euthymic  Affect:  Constricted  Thought Process:  Coherent  Orientation:  Full (Time, Place, and Person)  Thought Content:  Rumination  Suicidal Thoughts:  No  Homicidal Thoughts:  No  Memory:  Immediate;   Fair Recent;   Poor Remote;   Fair  Judgement:  Impaired  Insight:  Fair  Psychomotor Activity:  Decreased  Concentration:  Concentration: Fair  Recall:  [employer] of Knowledge:  Fair  Language:  Fair  Akathisia:  No  Handed:  Right  AIMS (if indicated):      Assets:  Desire for Improvement Housing  ADL's:  Impaired  Cognition:  Impaired,  Mild  Sleep:        Treatment Plan Summary: Daily contact with patient to assess and evaluate symptoms and progress in treatment, Medication management and Plan Patient will be cut down a little bit on his Librium.  Can gradually continue to taper off of it.  Probably out of the worst risk of DTs at this point.  Spoke with social work and they are looking into possibly some kind of rehab or substance abuse treatment although the patient's history does not suggest a very likely good prognosis.  Spent some time talking with him supportive therapy and encouragement.  No change to any other medicine.  We will follow as needed.  Does not meet criteria nor need inpatient hospitalization on our unit.  Disposition: No evidence of imminent risk to self or others at present.   Patient does not meet criteria for psychiatric inpatient admission. Supportive therapy provided about ongoing stressors.  Mordecai Rasmussen, MD 04/08/2018 6:59 PM

## 2018-04-08 NOTE — Progress Notes (Signed)
[facility]   PATIENT NAME: Nilda SimmerJohn Burbach    MR#:  [id number]  DATE OF BIRTH:  1960-12-25  SUBJECTIVE:  CHIEF COMPLAINT: Patient is alert today and resting okay Blood pressure is better with IV fluids  REVIEW OF SYSTEMS:   Review of system   Constitutional: Negative for chills and fever.  HENT: Negative for congestion and sore throat.   Eyes: Negative for blurred vision and double vision.  Respiratory: Negative for cough and shortness of breath.   Cardiovascular: Negative for chest pain.  Gastrointestinal: Negative for nausea and vomiting.  Genitourinary: Negative for dysuria and frequency.  Musculoskeletal: Chronic back pain Neurological: Negative for dizziness and headaches.  Psychiatric/Behavioral: Negative for depression. DRUG ALLERGIES:   Allergies  Allergen Reactions  . Nsaids Other (See Comments)    Duodenal ulcers, GI bleeding  . Other Other (See Comments)    Patient states he can't take any "mycin" medications because myasthenia gravis.    VITALS:  Blood pressure 114/79, pulse 89, temperature 98.1 F (36.7 C), temperature source Oral, resp. rate 18, height 5\' 11"  (1.803 m), weight 81.9 kg, SpO2 100 %.  PHYSICAL EXAMINATION:  GENERAL:  57 y.o.-year-old patient lying in the bed with no acute distress.  Clinically looks comfortable EYES: Pupils equal, round, reactive to light and accommodation. No scleral icterus HEENT: Head atraumatic, normocephalic. Oropharynx and nasopharynx clear.  NECK:  Supple, no jugular venous distention. No thyroid enlargement, no tenderness.  LUNGS: Normal breath sounds bilaterally, no wheezing, rales,rhonchi or crepitation. No use of accessory muscles of respiration.  CARDIOVASCULAR: S1, S2 normal. No murmurs, rubs, or gallops.  ABDOMEN: Soft, nontender, nondistended. Bowel sounds present.  EXTREMITIES: No pedal edema, cyanosis, or clubbing.  NEUROLOGIC: Delirious sensation intact. Gait not  checked.  PSYCHIATRIC: The patient is awake, alert and oriented x3 SKIN: No obvious rash, lesion, or ulcer.    LABORATORY PANEL:   CBC Recent Labs  Lab 04/06/18 0453  WBC 6.5  HGB 12.5*  HCT 40.7  PLT 167   ------------------------------------------------------------------------------------------------------------------  Chemistries  Recent Labs  Lab 04/06/18 0453  04/08/18 0349  NA 137  --  136  K 4.2   < > 4.2  CL 105  --  105  CO2 25  --  24  GLUCOSE 182*  --  145*  BUN 17  --  18  CREATININE 0.86  --  0.92  CALCIUM 9.0  --  8.9  MG 1.9   < > 1.8  AST 40  --   --   ALT 33  --   --   ALKPHOS 59  --   --   BILITOT 0.8  --   --    < > = values in this interval not displayed.   ------------------------------------------------------------------------------------------------------------------  Cardiac Enzymes No results for input(s): TROPONINI in the last 168 hours. ------------------------------------------------------------------------------------------------------------------  RADIOLOGY:  No results found.  EKG:   Orders placed or performed during the hospital encounter of 02/03/18  . ED EKG  . ED EKG  . EKG 12-Lead  . EKG 12-Lead    ASSESSMENT AND PLAN:   A/P: 36M PMHx EtOH  Acute metabolic encephalopathy: Secondary to alcohol withdrawal Patient is delirious ciwa protocol Seen and evaluated by psychiatry who is recommending standing Librium 50 mg 3 times daily, tapering plans per psychiatry; Librium 25 mg 4 times daily as needed for CIWA >8 -Recommending Ativan 1 mg every 6 hours for ciwa greater than 15 Librium tapering per  psychiatry discussed with Dr. Toni Amend  thiamine, folate, MVI Patient will be benefited with outpatient AA -Recommending to continue BuSpar 15 mg twice daily for substance-induced mood disorder.  Patient does not meet criteria for inpatient psych placement not IVC  Hypokalemia Repleted Potassium at 4.4 magnesium at  1.7  Chronic atrial fibrillation Rate controlled.  Continue home medication aspirin 325 mg, amiodarone and Cardizem  Essential hypertension holding home medication metoprolol and Cardizem secondary to hypotension.  Patient is on IV fluids  Chronic low back pain resume and continue oxycodone at home dose as needed   Diabetes-insulin sliding scale  Thrombocytopenia in the setting of alcohol abuse Resolved       -DVT PPx: Lovenox.  If platelet count is greater than 100,000 -Code status: Full code. -Disposition: Anticipating home, but patient does not feel comfortable to stay with his dad , would rather live with his brothers, he will discuss with his brothers per social worker     All the records are reviewed and case discussed with Care Management/Social Workerr. Management plans discussed with the patient, family and they are in agreement.  CODE STATUS: FC  TOTAL TIME TAKING CARE OF THIS PATIENT: 35 minutes.   POSSIBLE D/C IN 2 DAYS, DEPENDING ON CLINICAL CONDITION.  Note: This dictation was prepared with Dragon dictation along with smaller phrase technology. Any transcriptional errors that result from this process are unintentional.   Ramonita Lab M.D on 04/08/2018 at 3:56 PM  Between 7am to 6pm - Pager - [phone] After 6pm go to [email or web address] - password [email or web address] [facility]  [facility]  Office  [phone]  CC: Primary care physician; [name], MD

## 2018-04-09 LAB — GLUCOSE, CAPILLARY
GLUCOSE-CAPILLARY: 106 mg/dL — AB (ref 70–99)
Glucose-Capillary: 107 mg/dL — ABNORMAL HIGH (ref 70–99)
Glucose-Capillary: 118 mg/dL — ABNORMAL HIGH (ref 70–99)
Glucose-Capillary: 142 mg/dL — ABNORMAL HIGH (ref 70–99)

## 2018-04-09 MED ORDER — SODIUM CHLORIDE 0.9% FLUSH
3.0000 mL | Freq: Two times a day (BID) | INTRAVENOUS | Status: DC
  Administered 2018-04-09: 3 mL via INTRAVENOUS

## 2018-04-09 NOTE — Progress Notes (Signed)
[facility] [place] at [facility]   PATIENT NAME: Kurt SimmerJohn Horton    MR#:  [id number]  DATE OF BIRTH:  Mar 19, 1961  SUBJECTIVE:  CHIEF COMPLAINT: Patient is alert today and resting fine , very weak on his extremities.  Planning to live with his dad  REVIEW OF SYSTEMS:   Review of system   Constitutional: Negative for chills and fever.  HENT: Negative for congestion and sore throat.   Eyes: Negative for blurred vision and double vision.  Respiratory: Negative for cough and shortness of breath.   Cardiovascular: Negative for chest pain.  Gastrointestinal: Negative for nausea and vomiting.  Genitourinary: Negative for dysuria and frequency.  Musculoskeletal: Chronic back pain Neurological: Negative for dizziness and headaches.  Psychiatric/Behavioral: Negative for depression. DRUG ALLERGIES:   Allergies  Allergen Reactions  . Nsaids Other (See Comments)    Duodenal ulcers, GI bleeding  . Other Other (See Comments)    Patient states he can't take any "mycin" medications because myasthenia gravis.    VITALS:  Blood pressure 133/81, pulse 78, temperature 97.7 F (36.5 C), temperature source Oral, resp. rate 18, height 5\' 11"  (1.803 m), weight 81.9 kg, SpO2 98 %.  PHYSICAL EXAMINATION:  GENERAL:  57 y.o.-year-old patient lying in the bed with no acute distress.  Clinically looks comfortable EYES: Pupils equal, round, reactive to light and accommodation. No scleral icterus HEENT: Head atraumatic, normocephalic. Oropharynx and nasopharynx clear.  NECK:  Supple, no jugular venous distention. No thyroid enlargement, no tenderness.  LUNGS: Normal breath sounds bilaterally, no wheezing, rales,rhonchi or crepitation. No use of accessory muscles of respiration.  CARDIOVASCULAR: S1, S2 normal. No murmurs, rubs, or gallops.  ABDOMEN: Soft, nontender, nondistended. Bowel sounds present.  EXTREMITIES: No pedal edema, cyanosis, or clubbing.  NEUROLOGIC: Delirious  sensation intact. Gait not checked.  PSYCHIATRIC: The patient is awake, alert and oriented x3 SKIN: No obvious rash, lesion, or ulcer.    LABORATORY PANEL:   CBC Recent Labs  Lab 04/06/18 0453  WBC 6.5  HGB 12.5*  HCT 40.7  PLT 167   ------------------------------------------------------------------------------------------------------------------  Chemistries  Recent Labs  Lab 04/06/18 0453  04/08/18 0349  NA 137  --  136  K 4.2   < > 4.2  CL 105  --  105  CO2 25  --  24  GLUCOSE 182*  --  145*  BUN 17  --  18  CREATININE 0.86  --  0.92  CALCIUM 9.0  --  8.9  MG 1.9   < > 1.8  AST 40  --   --   ALT 33  --   --   ALKPHOS 59  --   --   BILITOT 0.8  --   --    < > = values in this interval not displayed.   ------------------------------------------------------------------------------------------------------------------  Cardiac Enzymes No results for input(s): TROPONINI in the last 168 hours. ------------------------------------------------------------------------------------------------------------------  RADIOLOGY:  No results found.  EKG:   Orders placed or performed during the hospital encounter of 02/03/18  . ED EKG  . ED EKG  . EKG 12-Lead  . EKG 12-Lead    ASSESSMENT AND PLAN:   A/P: 45M PMHx EtOH  Acute metabolic encephalopathy: Secondary to alcohol withdrawal Patient is delirious ciwa protocol Seen and evaluated by psychiatry who is recommending standing Librium 50 mg 3 times daily tapered to 2 times a day Librium 25 mg 4 times daily as needed for CIWA >8 -Recommending Ativan 1 mg every 6 hours  for ciwa greater than 15 Librium tapering per psychiatry discussed with Dr. Toni Amend, he is following thiamine, folate, MVI Patient will be benefited with outpatient AA -Recommending to continue BuSpar 15 mg twice daily for substance-induced mood disorder.  Patient does not meet criteria for inpatient psych placement not  IVC  Hypokalemia Repleted Potassium at 4.4 magnesium at 1.7  Chronic atrial fibrillation Rate controlled.  Continue home medication aspirin 325 mg, amiodarone and Cardizem  Essential hypertension holding home medication metoprolol and Cardizem secondary to hypotension.  Patient is on IV fluids  Chronic low back pain resume and continue oxycodone at home dose as needed   Diabetes-insulin sliding scale  Thrombocytopenia in the setting of alcohol abuse Resolved  Generalized weakness physical therapy is recommending home health physical therapy       -DVT PPx: Lovenox.  If platelet count is greater than 100,000 -Code status: Full code. -Disposition: Anticipating home, patient is reporting that he is going to live with his dad, his dad is agreeable     All the records are reviewed and case discussed with Care Management/Social Workerr. Management plans discussed with the patient, family and they are in agreement.  CODE STATUS: FC  TOTAL TIME TAKING CARE OF THIS PATIENT: 32 minutes.   POSSIBLE D/C IN 1 DAYS, DEPENDING ON CLINICAL CONDITION.  Note: This dictation was prepared with Dragon dictation along with smaller phrase technology. Any transcriptional errors that result from this process are unintentional.   Ramonita Lab M.D on 04/09/2018 at 1:13 PM  Between 7am to 6pm - Pager - [phone] After 6pm go to [email or web address] - password [email or web address] [facility]  [place] [facility]  Office  [phone]  CC: Primary care physician; [name], MD

## 2018-04-09 NOTE — Plan of Care (Signed)
  Problem: Education: Goal: Knowledge of General Education information will improve Description: Including pain rating scale, medication(s)/side effects and non-pharmacologic comfort measures Outcome: Progressing   Problem: Health Behavior/Discharge Planning: Goal: Ability to manage health-related needs will improve Outcome: Progressing   Problem: Clinical Measurements: Goal: Ability to maintain clinical measurements within normal limits will improve Outcome: Progressing Goal: Diagnostic test results will improve Outcome: Progressing   Problem: Activity: Goal: Risk for activity intolerance will decrease Outcome: Progressing   Problem: Coping: Goal: Level of anxiety will decrease Outcome: Progressing   

## 2018-04-09 NOTE — Plan of Care (Signed)
  Problem: Education: Goal: Knowledge of General Education information will improve Description Including pain rating scale, medication(s)/side effects and non-pharmacologic comfort measures Outcome: Progressing   Problem: Clinical Measurements: Goal: Ability to maintain clinical measurements within normal limits will improve Outcome: Progressing   Problem: Activity: Goal: Risk for activity intolerance will decrease Outcome: Progressing Pt ambulated around nurses station and tolerated well.

## 2018-04-10 LAB — GLUCOSE, CAPILLARY
GLUCOSE-CAPILLARY: 134 mg/dL — AB (ref 70–99)
Glucose-Capillary: 127 mg/dL — ABNORMAL HIGH (ref 70–99)

## 2018-04-10 MED ORDER — BUSPIRONE HCL 15 MG PO TABS: 15.0000 mg | ORAL_TABLET | Freq: Two times a day (BID) | ORAL | 0 refills | Status: DC

## 2018-04-10 MED ORDER — NICOTINE 14 MG/24HR TD PT24: 14.0000 mg | MEDICATED_PATCH | Freq: Every day | TRANSDERMAL | 0 refills | Status: DC

## 2018-04-10 NOTE — Consult Note (Signed)
[facility] Face-to-Face Psychiatry Consult   Reason for Consult: Follow-up consult for this patient with alcohol abuse and delirium tremens Referring Physician: Gouru Patient Identification: CARON TARDIF MRN:  [id number] Principal Diagnosis: Alcohol withdrawal delirium (HCC) Diagnosis:  Principal Problem:   Alcohol withdrawal delirium (HCC) Active Problems:   Alcohol withdrawal (HCC)   Alcohol abuse   Total Time spent with patient: 30 minutes  Subjective:   Kurt Horton is a 57 y.o. male patient admitted with "I am going back to my parents home for now".  HPI: Follow-up for this patient with alcohol abuse who has gone through delirium tremens.  Patient has tapered down on his Librium and is no longer having any signs of delirium.  He was sitting up alert and oriented putting on his clothes when I came by.  No sign of acute confusion although he still is a little bit tremulous.  Patient says his mood is feeling okay although he is apprehensive about going home.  He repeats that he wishes that he could stop drinking but has found it just impossible to follow through so far.  He is denying suicidal thoughts denying any homicidal thoughts denying any hallucinations.  Affect a little blunted.  Thoughts lucid.  Past Psychiatric History: Depression but mainly a long history of alcohol abuse without any recent ability to stay sober meanwhile with his physical compromise getting worse  Risk to Self:   Risk to Others:   Prior Inpatient Therapy:   Prior Outpatient Therapy:    Past Medical History:  Past Medical History:  Diagnosis Date  . A-fib (HCC)   . Alcohol abuse   . Alcohol withdrawal (HCC) 11/12/2016  . Diabetes mellitus without complication (HCC)   . DJD (degenerative joint disease)   . Hypertension   . Myasthenia gravis (HCC)   . Renal disorder     Past Surgical History:  Procedure Laterality Date  . AMPUTATION TOE Left 12/29/2015   Procedure: AMPUTATION TOE;  Surgeon: Gwyneth Revels, DPM;  Location: [facility] ORS;  Service: Podiatry;  Laterality: Left;  . ESOPHAGOGASTRODUODENOSCOPY N/A 02/05/2018   Procedure: ESOPHAGOGASTRODUODENOSCOPY (EGD);  Surgeon: Pasty Spillers, MD;  Location: [facility] ENDOSCOPY;  Service: Endoscopy;  Laterality: N/A;  . PERIPHERAL VASCULAR CATHETERIZATION N/A 12/27/2015   Procedure: Lower Extremity Angiography;  Surgeon: Annice Needy, MD;  Location: [facility] INVASIVE CV LAB;  Service: Cardiovascular;  Laterality: N/A;  . right hip surgery    . right shoulder surgery     Family History:  Family History  Problem Relation Age of Onset  . Rheum arthritis Father    Family Psychiatric  History: See previous notes Social History:  Social History   Substance and Sexual Activity  Alcohol Use Yes   Comment: Pt had tequila and rum, at least a 5th or a pint a day     Social History   Substance and Sexual Activity  Drug Use No    Social History   Socioeconomic History  . Marital status: Divorced    Spouse name: Not on file  . Number of children: Not on file  . Years of education: Not on file  . Highest education level: Not on file  Occupational History  . Not on file  Social Needs  . Financial resource strain: Not on file  . Food insecurity:    Worry: Not on file    Inability: Not on file  . Transportation needs:    Medical: Not on file    Non-medical: Not  on file  Tobacco Use  . Smoking status: Current Every Day Smoker    Packs/day: 0.50    Types: Cigarettes  . Smokeless tobacco: Never Used  Substance and Sexual Activity  . Alcohol use: Yes    Comment: Pt had tequila and rum, at least a 5th or a pint a day  . Drug use: No  . Sexual activity: Not on file  Lifestyle  . Physical activity:    Days per week: Not on file    Minutes per session: Not on file  . Stress: Not on file  Relationships  . Social connections:    Talks on phone: Not on file    Gets together: Not on file    Attends religious service: Not on file    Active  member of club or organization: Not on file    Attends meetings of clubs or organizations: Not on file    Relationship status: Not on file  Other Topics Concern  . Not on file  Social History Narrative   Live with Parents, ambulates with a cane. Falls lately.   Additional Social History:    Allergies:   Allergies  Allergen Reactions  . Nsaids Other (See Comments)    Duodenal ulcers, GI bleeding  . Other Other (See Comments)    Patient states he can't take any "mycin" medications because myasthenia gravis.    Labs:  Results for orders placed or performed during the hospital encounter of 04/01/18 (from the past 48 hour(s))  Glucose, capillary     Status: Abnormal   Collection Time: 04/08/18  8:43 PM  Result Value Ref Range   Glucose-Capillary 118 (H) 70 - 99 mg/dL  Glucose, capillary     Status: Abnormal   Collection Time: 04/09/18  7:41 AM  Result Value Ref Range   Glucose-Capillary 107 (H) 70 - 99 mg/dL  Glucose, capillary     Status: Abnormal   Collection Time: 04/09/18 11:30 AM  Result Value Ref Range   Glucose-Capillary 106 (H) 70 - 99 mg/dL  Glucose, capillary     Status: Abnormal   Collection Time: 04/09/18  5:12 PM  Result Value Ref Range   Glucose-Capillary 118 (H) 70 - 99 mg/dL  Glucose, capillary     Status: Abnormal   Collection Time: 04/09/18  9:59 PM  Result Value Ref Range   Glucose-Capillary 142 (H) 70 - 99 mg/dL  Glucose, capillary     Status: Abnormal   Collection Time: 04/10/18  7:54 AM  Result Value Ref Range   Glucose-Capillary 127 (H) 70 - 99 mg/dL  Glucose, capillary     Status: Abnormal   Collection Time: 04/10/18 11:43 AM  Result Value Ref Range   Glucose-Capillary 134 (H) 70 - 99 mg/dL    Current Facility-Administered Medications  Medication Dose Route Frequency Provider Last Rate Last Dose  . 0.9 %  sodium chloride infusion   Intravenous Continuous Gouru, Aruna, MD 50 mL/hr at 04/10/18 0414    . amiodarone (PACERONE) tablet 200 mg  200  mg Oral Daily Barbaraann Rondo, MD   200 mg at 04/10/18 0937  . aspirin EC tablet 325 mg  325 mg Oral Daily Barbaraann Rondo, MD   325 mg at 04/10/18 0936  . bisacodyl (DULCOLAX) EC tablet 5 mg  5 mg Oral Daily PRN Barbaraann Rondo, MD      . busPIRone (BUSPAR) tablet 15 mg  15 mg Oral BID Barbaraann Rondo, MD   15 mg at 04/10/18 0937  .  diltiazem (CARDIZEM CD) 24 hr capsule 180 mg  180 mg Oral Daily Barbaraann Rondo, MD   180 mg at 04/10/18 0937  . enoxaparin (LOVENOX) injection 40 mg  40 mg Subcutaneous Q24H Gouru, Aruna, MD   40 mg at 04/09/18 2254  . folic acid (FOLVITE) tablet 1 mg  1 mg Oral Daily Barbaraann Rondo, MD   1 mg at 04/10/18 0936  . furosemide (LASIX) tablet 40 mg  40 mg Oral Daily Barbaraann Rondo, MD   40 mg at 04/09/18 0454  . gabapentin (NEURONTIN) capsule 600 mg  600 mg Oral TID Barbaraann Rondo, MD   600 mg at 04/10/18 0936  . insulin aspart (novoLOG) injection 0-15 Units  0-15 Units Subcutaneous TID WC Barbaraann Rondo, MD   2 Units at 04/08/18 1208  . insulin aspart (novoLOG) injection 0-5 Units  0-5 Units Subcutaneous QHS Sridharan, Prasanna, MD      . ipratropium-albuterol (DUONEB) 0.5-2.5 (3) MG/3ML nebulizer solution 3 mL  3 mL Nebulization Q4H PRN Oralia Manis, MD   3 mL at 04/07/18 0009  . lisinopril (PRINIVIL,ZESTRIL) tablet 10 mg  10 mg Oral Daily Barbaraann Rondo, MD   10 mg at 04/10/18 0936  . loperamide (IMODIUM) capsule 2 mg  2 mg Oral PRN Barbaraann Rondo, MD   2 mg at 04/05/18 1808  . metoprolol succinate (TOPROL-XL) 24 hr tablet 50 mg  50 mg Oral Daily Barbaraann Rondo, MD   50 mg at 04/10/18 0981  . multivitamin with minerals tablet 1 tablet  1 tablet Oral Daily Barbaraann Rondo, MD   1 tablet at 04/10/18 0936  . mycophenolate (CELLCEPT) capsule 1,000 mg  1,000 mg Oral QHS Barbaraann Rondo, MD   1,000 mg at 04/09/18 2254  . mycophenolate (CELLCEPT) capsule 500 mg  500 mg Oral Vinnie Langton, Prasanna, MD   500 mg  at 04/10/18 0937  . nicotine (NICODERM CQ - dosed in mg/24 hours) patch 14 mg  14 mg Transdermal Daily Oralia Manis, MD   14 mg at 04/10/18 1914  . ondansetron (ZOFRAN) tablet 4 mg  4 mg Oral Q6H PRN Barbaraann Rondo, MD       Or  . ondansetron (ZOFRAN) injection 4 mg  4 mg Intravenous Q6H PRN Barbaraann Rondo, MD      . oxyCODONE (Oxy IR/ROXICODONE) immediate release tablet 10 mg  10 mg Oral Q4H PRN Gouru, Aruna, MD   10 mg at 04/10/18 1452  . polyvinyl alcohol (LIQUIFILM TEARS) 1.4 % ophthalmic solution 1 drop  1 drop Both Eyes PRN Gouru, Aruna, MD   1 drop at 04/10/18 1037  . senna-docusate (Senokot-S) tablet 1 tablet  1 tablet Oral QHS PRN Barbaraann Rondo, MD      . sodium chloride 0.9 % bolus 1,000 mL  1,000 mL Intravenous Once Irean Hong, MD      . sodium chloride flush (NS) 0.9 % injection 3 mL  3 mL Intravenous Q12H Gouru, Aruna, MD   3 mL at 04/09/18 2310  . thiamine (VITAMIN B-1) tablet 100 mg  100 mg Oral Daily Barbaraann Rondo, MD   100 mg at 04/10/18 7829   Or  . thiamine (B-1) injection 100 mg  100 mg Intravenous Daily Barbaraann Rondo, MD      . traZODone (DESYREL) tablet 50 mg  50 mg Oral QHS PRN Oralia Manis, MD   50 mg at 04/10/18 0141    Musculoskeletal: Strength & Muscle Tone: within normal limits Gait & Station: normal Patient leans: N/A  Psychiatric Specialty Exam: Physical Exam  Nursing note and vitals reviewed. Constitutional: He appears well-developed and well-nourished.  HENT:  Head: Normocephalic and atraumatic.  Eyes: Pupils are equal, round, and reactive to light. Conjunctivae are normal.  Neck: Normal range of motion.  Cardiovascular: Regular rhythm and normal heart sounds.  Respiratory: Effort normal. No respiratory distress.  GI: Soft.  Musculoskeletal: Normal range of motion.  Neurological: He is alert.  Skin: Skin is warm and dry.  Psychiatric: Judgment normal. His mood appears anxious. His affect is blunt. His speech is  delayed. He is slowed. Cognition and memory are impaired. He expresses no homicidal and no suicidal ideation.    Review of Systems  Constitutional: Negative.   HENT: Negative.   Eyes: Negative.   Respiratory: Negative.   Cardiovascular: Negative.   Gastrointestinal: Negative.   Musculoskeletal: Negative.   Skin: Negative.   Neurological: Negative.   Psychiatric/Behavioral: Positive for memory loss and substance abuse. Negative for depression, hallucinations and suicidal ideas. The patient is nervous/anxious. The patient does not have insomnia.     Blood pressure 126/70, pulse 87, temperature 97.9 F (36.6 C), temperature source Oral, resp. rate 18, height 5\' 11"  (1.803 m), weight 89.9 kg, SpO2 96 %.Body mass index is 27.63 kg/m.  General Appearance: Casual  Eye Contact:  Good  Speech:  Slow  Volume:  Decreased  Mood:  Dysphoric  Affect:  Congruent  Thought Process:  Goal Directed  Orientation:  Full (Time, Place, and Person)  Thought Content:  Logical  Suicidal Thoughts:  No  Homicidal Thoughts:  No  Memory:  Immediate;   Fair Recent;   Fair Remote;   Fair  Judgement:  Fair  Insight:  Fair  Psychomotor Activity:  Tremor  Concentration:  Concentration: Fair  Recall:  [employer]Fair  Fund of Knowledge:  Fair  Language:  Fair  Akathisia:  No  Handed:  Right  AIMS (if indicated):     Assets:  Desire for Improvement Housing Social Support  ADL's:  Intact  Cognition:  Impaired,  Mild  Sleep:        Treatment Plan Summary: Plan Patient who has gone through delirium tremens.  Still a little bit slow and sluggish some short-term memory impairment at times although generally he is oriented right now.  He has enough insight to know that he is drinking himself to death.  He and I spent some time talking about how he really needs to be going to AA at least every day probably several times a day and to have a sponsor who he calls daily.  I wished him well and he expressed appreciation.   There is no indication to make any change to medicine right now.  No benefit to going downstairs to our unit.  Case reviewed with hospitalist  Disposition: No evidence of imminent risk to self or others at present.   Patient does not meet criteria for psychiatric inpatient admission. Supportive therapy provided about ongoing stressors.  Mordecai RasmussenJohn Jaymir Struble, MD 04/10/2018 4:59 PM

## 2018-04-10 NOTE — Progress Notes (Signed)
Patient complaining of being stressed out after a phone call with his dad.  Requesting valium.  Told patient that he does not have an order for valium but does have one for librium.  Gave him medication and will continue to monitor.   Patient also expresses that he does not believe he is ready to go home at this time.  He states he is unable to walk without max assistance and a walker and gait belt.  He wants to speak to MD.  I stated that Dr. Amado CoeGouru will be rounding this morning.

## 2018-04-10 NOTE — Clinical Social Work Note (Signed)
Patient will be discharging back home with his father CSW provided substance abuse resources in patient's discharge packet.  Kurt KnackEric R. [name], MSW, Theresia MajorsLCSWA [phone]  04/10/2018 2:29 PM

## 2018-04-10 NOTE — Care Management (Signed)
It was reported to CM that Kurt Horton informed staff that patient had called agency before they picked him up to ask to be taken to a different address that his parents.  It was documented on the voucher that driver should only take patient to the address on the voucher.

## 2018-04-10 NOTE — Progress Notes (Addendum)
Patient alert and oriented, vss.  Patient has ambulated with standby assistance with his cane.  Belongings given back to patient including a watch, two rings, soiled clothes/shoes and a cane.  Patient discharging back to home but his father is unable to pick him up d/t being sick.  Taxi voucher will be given to patient to go home.  He has been instructed to go straight home.  He has been educated on alcohol cessation in the future.  D/C telemtry and PIV.  Will be escorted out of hospital via wheelchair by volunteers.

## 2018-04-10 NOTE — Discharge Summary (Signed)
[facility] [place] [facility]   PATIENT NAME: Kurt SimmerJohn Horton    MR#:  [id number]  DATE OF BIRTH:  16-May-1960  DATE OF ADMISSION:  04/01/2018 ADMITTING PHYSICIAN: Barbaraann RondoPrasanna Sridharan, MD  DATE OF DISCHARGE:  04/10/18  PRIMARY CARE PHYSICIAN: Barbette ReichmannHande, Vishwanath, MD    ADMISSION DIAGNOSIS:  Opiate withdrawal (HCC) [F11.23] Alcohol withdrawal syndrome, with delirium (HCC) [F10.231] Alcoholic intoxication without complication (HCC) [F10.920] Chronic back pain, unspecified back location, unspecified back pain laterality [M54.9, G89.29]  DISCHARGE DIAGNOSIS:  Principal Problem:   Alcohol withdrawal delirium (HCC) Active Problems:   Alcohol withdrawal (HCC)   Alcohol abuse   SECONDARY DIAGNOSIS:   Past Medical History:  Diagnosis Date  . A-fib (HCC)   . Alcohol abuse   . Alcohol withdrawal (HCC) 11/12/2016  . Diabetes mellitus without complication (HCC)   . DJD (degenerative joint disease)   . Hypertension   . Myasthenia gravis (HCC)   . Renal disorder     HOSPITAL COURSE:   Acute metabolic encephalopathy: Secondary to alcohol withdrawal Patient is awake alert and mental status back to his baseline ciwa protocol prevented during the hospital course Seen and evaluated by psychiatry who is recommending standing Librium was given during the hospital course, eventually clinical situation improved and Librium tapered and discontinued by psychiatry -thiamine, folate, MVI to be continued by mouth Patient will be benefited with outpatient AA -Recommending to continue BuSpar 15 mg twice daily for substance-induced mood disorder.  Patient does not meet criteria for inpatient psych placement not IVC  Hypokalemia Repleted Potassium at 4.4 magnesium at 1.7  Chronic atrial fibrillation Rate controlled.  Continue home medication aspirin 325 mg, amiodarone and Cardizem  Essential hypertension improved with IV fluids home meds   chronic low back pain  resume and continue oxycodone at home dose as needed   Diabetes-insulin sliding scale  Thrombocytopenia in the setting of alcohol abuse Resolved  Generalized weakness physical therapy is recommending home health physical therapy       -DVT PPx: Lovenox.  If platelet count is greater than 100,000 -Code status: Full code. -Disposition: home, patient is reporting that he is going to live with his dad  DISCHARGE CONDITIONS:   Stable   CONSULTS OBTAINED:  Treatment Team:  Barbaraann RondoSridharan, Prasanna, MD Clapacs, Jackquline DenmarkJohn T, MD   PROCEDURES  None   DRUG ALLERGIES:   Allergies  Allergen Reactions  . Nsaids Other (See Comments)    Duodenal ulcers, GI bleeding  . Other Other (See Comments)    Patient states he can't take any "mycin" medications because myasthenia gravis.    DISCHARGE MEDICATIONS:   Allergies as of 04/10/2018      Reactions   Nsaids Other (See Comments)   Duodenal ulcers, GI bleeding   Other Other (See Comments)   Patient states he can't take any "mycin" medications because myasthenia gravis.      Medication List    STOP taking these medications   bisacodyl 5 MG EC tablet Commonly known as:  DULCOLAX   citalopram 20 MG tablet Commonly known as:  CELEXA   LORazepam 1 MG tablet Commonly known as:  ATIVAN   potassium chloride 10 MEQ tablet Commonly known as:  K-DUR,KLOR-CON     TAKE these medications   amiodarone 200 MG tablet Commonly known as:  PACERONE Take 1 tablet (200 mg total) by mouth daily.   aspirin EC 325 MG tablet Take 325 mg by mouth daily.   B-complex with vitamin C tablet Take  1 tablet by mouth daily.   busPIRone 15 MG tablet Commonly known as:  BUSPAR Take 1 tablet (15 mg total) by mouth 2 (two) times daily.   diltiazem 180 MG 24 hr capsule Commonly known as:  CARDIZEM CD Take 1 capsule (180 mg total) by mouth daily.   folic acid 1 MG tablet Commonly known as:  FOLVITE Take 1 tablet (1 mg total) by mouth daily.    furosemide 40 MG tablet Commonly known as:  LASIX Take 40 mg by mouth daily.   gabapentin 300 MG capsule Commonly known as:  NEURONTIN Take 2 capsules (600 mg total) by mouth 3 (three) times daily.   lisinopril 10 MG tablet Commonly known as:  PRINIVIL,ZESTRIL Take 10 mg by mouth daily.   metFORMIN 500 MG tablet Commonly known as:  GLUCOPHAGE Take 1 tablet (500 mg total) by mouth daily.   metoprolol succinate 50 MG 24 hr tablet Commonly known as:  TOPROL-XL Take 1 tablet (50 mg total) by mouth daily. Take with or immediately following a meal.   multivitamin Tabs tablet Take 1 tablet by mouth daily.   mycophenolate 500 MG tablet Commonly known as:  CELLCEPT Take 1 tablet (500 mg total) by mouth 2 (two) times daily. Take 1 tablet (500MG ) by mouth every morning and 2 tablets (1000MG ) by mouth every evening What changed:    how much to take  when to take this  additional instructions   nicotine 14 mg/24hr patch Commonly known as:  NICODERM CQ - dosed in mg/24 hours Place 1 patch (14 mg total) onto the skin daily. Start taking on:  04/11/2018   oxyCODONE 5 MG immediate release tablet Commonly known as:  Oxy IR/ROXICODONE Take 1-2 tablets (5-10 mg total) by mouth every 4 (four) hours as needed for moderate pain or severe pain. What changed:    how much to take  when to take this   senna-docusate 8.6-50 MG tablet Commonly known as:  Senokot-S Take 1 tablet by mouth at bedtime as needed for mild constipation. What changed:  when to take this   testosterone cypionate 200 MG/ML injection Commonly known as:  DEPOTESTOSTERONE CYPIONATE Inject 200 mg into the muscle every 14 (fourteen) days.   thiamine 100 MG tablet Take 1 tablet (100 mg total) by mouth daily.            Durable Medical Equipment  (From admission, onward)         Start     Ordered   04/09/18 1139  For home use only DME Walker rolling  Once    Comments:  RW with 5" wheel  Question:  Patient  needs a walker to treat with the following condition  Answer:  Weakness   04/09/18 1138           DISCHARGE INSTRUCTIONS:  Stop drinking alcohol Outpatient follow-up with alcohol Anonymous - 2 days Outpatient follow-up with primary care physician in 3 to 4 days Home health physical therapy   DIET:  Cardiac diet and Diabetic diet  DISCHARGE CONDITION:  Stable  ACTIVITY:  Activity as tolerated  OXYGEN:  Home Oxygen: No.   Oxygen Delivery: room air   DISCHARGE LOCATION:  home  Or shelter  If you experience worsening of your admission symptoms, develop shortness of breath, life threatening emergency, suicidal or homicidal thoughts you must seek medical attention immediately by calling 911 or calling your MD immediately  if symptoms less severe.  You Must read complete instructions/literature along with all  the possible adverse reactions/side effects for all the Medicines you take and that have been prescribed to you. Take any new Medicines after you have completely understood and accpet all the possible adverse reactions/side effects.   Please note  You were cared for by a hospitalist during your hospital stay. If you have any questions about your discharge medications or the care you received while you were in the hospital after you are discharged, you can call the unit and asked to speak with the hospitalist on call if the hospitalist that took care of you is not available. Once you are discharged, your primary care physician will handle any further medical issues. Please note that NO REFILLS for any discharge medications will be authorized once you are discharged, as it is imperative that you return to your primary care physician (or establish a relationship with a primary care physician if you do not have one) for your aftercare needs so that they can reassess your need for medications and monitor your lab values.     Today  Chief Complaint  Patient presents with  .  Alcohol Intoxication    Patient is doing much better.  Awake alert and oriented x3.  Reporting generalized weakness.  Physical therapy has seen the patient and recommending home health PT.  Patient states that he is going to go back to his dad's place after discharge  ROS:  CONSTITUTIONAL: Denies fevers, chills. Denies any fatigue, but reports weakness.  EYES: Denies blurry vision, double vision, eye pain. EARS, NOSE, THROAT: Denies tinnitus, ear pain, hearing loss. RESPIRATORY: Denies cough, wheeze, shortness of breath.  CARDIOVASCULAR: Denies chest pain, palpitations, edema.  GASTROINTESTINAL: Denies nausea, vomiting, diarrhea, abdominal pain. Denies bright red blood per rectum. SKIN: Denies rash or lesion. MUSCULOSKELETAL: Patient has chronic pain in his back shoulders NEUROLOGIC: Denies paralysis, paresthesias.  PSYCHIATRIC: Denies anxiety or depressive symptoms.   VITAL SIGNS:  Blood pressure 126/70, pulse 87, temperature 97.9 F (36.6 C), temperature source Oral, resp. rate 18, height 5\' 11"  (1.803 m), weight 89.9 kg, SpO2 96 %.  I/O:    Intake/Output Summary (Last 24 hours) at 04/10/2018 1330 Last data filed at 04/10/2018 1303 Gross per 24 hour  Intake 1926.74 ml  Output 2075 ml  Net -148.26 ml    PHYSICAL EXAMINATION:  GENERAL:  57 y.o.-year-old patient lying in the bed with no acute distress.  EYES: Pupils equal, round, reactive to light and accommodation. No scleral icterus. Extraocular muscles intact.  HEENT: Head atraumatic, normocephalic. Oropharynx and nasopharynx clear.  NECK:  Supple, no jugular venous distention. No thyroid enlargement, no tenderness.  LUNGS: Normal breath sounds bilaterally, no wheezing, rales,rhonchi or crepitation. No use of accessory muscles of respiration.  CARDIOVASCULAR: S1, S2 normal. No murmurs, rubs, or gallops.  ABDOMEN: Soft, non-tender, non-distended. Bowel sounds present. No organomegaly or mass.  EXTREMITIES: No pedal edema,  cyanosis, or clubbing.  NEUROLOGIC: Awake, alert and oriented x3 sensation intact. Gait not checked.  PSYCHIATRIC: The patient is alert and oriented x 3.  SKIN: No obvious rash, lesion, or ulcer.   DATA REVIEW:   CBC Recent Labs  Lab 04/06/18 0453  WBC 6.5  HGB 12.5*  HCT 40.7  PLT 167    Chemistries  Recent Labs  Lab 04/06/18 0453  04/08/18 0349  NA 137  --  136  K 4.2   < > 4.2  CL 105  --  105  CO2 25  --  24  GLUCOSE 182*  --  145*  BUN 17  --  18  CREATININE 0.86  --  0.92  CALCIUM 9.0  --  8.9  MG 1.9   < > 1.8  AST 40  --   --   ALT 33  --   --   ALKPHOS 59  --   --   BILITOT 0.8  --   --    < > = values in this interval not displayed.    Cardiac Enzymes No results for input(s): TROPONINI in the last 168 hours.  Microbiology Results  Results for orders placed or performed during the hospital encounter of 01/19/18  MRSA PCR Screening     Status: None   Collection Time: 01/20/18  3:39 AM  Result Value Ref Range Status   MRSA by PCR NEGATIVE NEGATIVE Final    Comment:        The GeneXpert MRSA Assay (FDA approved for NASAL specimens only), is one component of a comprehensive MRSA colonization surveillance program. It is not intended to diagnose MRSA infection nor to guide or monitor treatment for MRSA infections. Performed at [facility], [address], [place], [place] [address]     RADIOLOGY:  No results found.  EKG:   Orders placed or performed during the hospital encounter of 02/03/18  . ED EKG  . ED EKG  . EKG 12-Lead  . EKG 12-Lead      Management plans discussed with the patient, family and they are in agreement.  CODE STATUS:     Code Status Orders  (From admission, onward)         Start     Ordered   04/03/18 0548  Full code  Continuous     04/03/18 0547        Code Status History    Date Active Date Inactive Code Status Order ID Comments User Context   02/04/2018 1528 02/07/2018 1728 Full Code  [id number]  Campbell Stall, MD ED   02/03/2018 2233 02/04/2018 0841 Full Code [id number]  Jene Every, MD ED   01/20/2018 0140 01/22/2018 1819 Full Code [id number]  Barbaraann Rondo, MD ED   01/19/2018 1239 01/20/2018 0140 Full Code [id number]  Myrna Blazer, MD ED   12/22/2017 0841 12/25/2017 2031 Full Code [id number]  Barbaraann Rondo, MD Inpatient   10/27/2017 1522 11/02/2017 1432 Full Code [id number]  Milagros Loll, MD ED   10/21/2017 1945 10/23/2017 1833 Full Code [id number]  Auburn Bilberry, MD Inpatient   10/21/2017 1504 10/21/2017 1945 Full Code [id number]  Arnaldo Natal, MD ED   10/10/2017 1314 10/11/2017 1147 Full Code [id number]  Willy Eddy, MD ED   10/05/2017 2214 10/07/2017 2101 Full Code [id number]  Arnaldo Natal, MD ED   10/05/2017 1924 10/05/2017 2214 Full Code [id number]  Arnaldo Natal, MD ED   10/01/2017 0807 08/12/2017 1650 Full Code [id number]  Barbaraann Rondo, MD Inpatient   08/24/2017 1756 08/27/2017 1849 Full Code [id number]  Enid Baas, MD ED   08/14/2017 0030 08/18/2017 1605 Full Code [id number]  Oralia Manis, MD Inpatient   06/04/2017 0116 06/06/2017 1701 Full Code [id number]  Oralia Manis, MD Inpatient   03/19/2017 1559 03/20/2017 2128 Full Code [id number]  Arnaldo Natal, MD ED   02/25/2017 1421 03/01/2017 1829 Full Code [id number]  Houston Siren, MD Inpatient   11/12/2016 1533 11/14/2016 1425 Full Code [id number]  Marguarite Arbour, MD Inpatient   12/26/2015 1927 12/30/2015 1604 Full Code [id number]  Marguarite Arbour, MD Inpatient  TOTAL TIME TAKING CARE OF THIS PATIENT: 43  minutes.   Note: This dictation was prepared with Dragon dictation along with smaller phrase technology. Any transcriptional errors that result from this process are unintentional.   @M[facility] @  on 04/10/2018 at 1:30 PM  Between 7am to 6pm - Pager - [phone]  After 6pm go to [email or web address] - password [email or web address] [facility]  [facility] [place]  Office  [phone]  CC: Primary care  physician; [name], MD

## 2018-04-15 ENCOUNTER — Encounter: Payer: Self-pay | Admitting: Emergency Medicine

## 2018-04-15 ENCOUNTER — Emergency Department: Payer: Medicaid Other

## 2018-04-15 ENCOUNTER — Inpatient Hospital Stay
Admission: EM | Admit: 2018-04-15 | Discharge: 2018-04-19 | DRG: 896 | Disposition: A | Discharge status: Home / Self Care | Payer: Medicaid Other | Attending: Internal Medicine | Admitting: Internal Medicine

## 2018-04-15 ENCOUNTER — Inpatient Hospital Stay: Payer: Medicaid Other

## 2018-04-15 ENCOUNTER — Other Ambulatory Visit: Payer: Self-pay

## 2018-04-15 DIAGNOSIS — F10231 Alcohol dependence with withdrawal delirium: Secondary | ICD-10-CM

## 2018-04-15 DIAGNOSIS — S30820A Blister (nonthermal) of lower back and pelvis, initial encounter: Secondary | ICD-10-CM | POA: Diagnosis present

## 2018-04-15 DIAGNOSIS — R9401 Abnormal electroencephalogram [EEG]: Secondary | ICD-10-CM | POA: Diagnosis present

## 2018-04-15 DIAGNOSIS — S065X9A Traumatic subdural hemorrhage with loss of consciousness of unspecified duration, initial encounter: Secondary | ICD-10-CM | POA: Diagnosis present

## 2018-04-15 DIAGNOSIS — S300XXA Contusion of lower back and pelvis, initial encounter: Secondary | ICD-10-CM | POA: Diagnosis present

## 2018-04-15 DIAGNOSIS — I482 Chronic atrial fibrillation, unspecified: Secondary | ICD-10-CM | POA: Diagnosis present

## 2018-04-15 DIAGNOSIS — Z79899 Other long term (current) drug therapy: Secondary | ICD-10-CM | POA: Diagnosis not present

## 2018-04-15 DIAGNOSIS — G8929 Other chronic pain: Secondary | ICD-10-CM | POA: Diagnosis present

## 2018-04-15 DIAGNOSIS — F10939 Alcohol use, unspecified with withdrawal, unspecified: Secondary | ICD-10-CM | POA: Diagnosis present

## 2018-04-15 DIAGNOSIS — Z716 Tobacco abuse counseling: Secondary | ICD-10-CM

## 2018-04-15 DIAGNOSIS — E876 Hypokalemia: Secondary | ICD-10-CM | POA: Diagnosis present

## 2018-04-15 DIAGNOSIS — I5042 Chronic combined systolic (congestive) and diastolic (congestive) heart failure: Secondary | ICD-10-CM | POA: Diagnosis present

## 2018-04-15 DIAGNOSIS — Z888 Allergy status to other drugs, medicaments and biological substances status: Secondary | ICD-10-CM | POA: Diagnosis not present

## 2018-04-15 DIAGNOSIS — F17210 Nicotine dependence, cigarettes, uncomplicated: Secondary | ICD-10-CM | POA: Diagnosis present

## 2018-04-15 DIAGNOSIS — Z7151 Drug abuse counseling and surveillance of drug abuser: Secondary | ICD-10-CM

## 2018-04-15 DIAGNOSIS — Z7984 Long term (current) use of oral hypoglycemic drugs: Secondary | ICD-10-CM

## 2018-04-15 DIAGNOSIS — E1140 Type 2 diabetes mellitus with diabetic neuropathy, unspecified: Secondary | ICD-10-CM | POA: Diagnosis present

## 2018-04-15 DIAGNOSIS — I110 Hypertensive heart disease with heart failure: Secondary | ICD-10-CM | POA: Diagnosis present

## 2018-04-15 DIAGNOSIS — Z8261 Family history of arthritis: Secondary | ICD-10-CM | POA: Diagnosis not present

## 2018-04-15 DIAGNOSIS — Z7982 Long term (current) use of aspirin: Secondary | ICD-10-CM

## 2018-04-15 DIAGNOSIS — G7000 Myasthenia gravis without (acute) exacerbation: Secondary | ICD-10-CM | POA: Diagnosis present

## 2018-04-15 DIAGNOSIS — Z886 Allergy status to analgesic agent status: Secondary | ICD-10-CM

## 2018-04-15 DIAGNOSIS — Z89422 Acquired absence of other left toe(s): Secondary | ICD-10-CM | POA: Diagnosis not present

## 2018-04-15 DIAGNOSIS — G4733 Obstructive sleep apnea (adult) (pediatric): Secondary | ICD-10-CM | POA: Diagnosis present

## 2018-04-15 DIAGNOSIS — F1010 Alcohol abuse, uncomplicated: Secondary | ICD-10-CM

## 2018-04-15 DIAGNOSIS — Z7141 Alcohol abuse counseling and surveillance of alcoholic: Secondary | ICD-10-CM

## 2018-04-15 DIAGNOSIS — M545 Low back pain, unspecified: Secondary | ICD-10-CM

## 2018-04-15 DIAGNOSIS — F10239 Alcohol dependence with withdrawal, unspecified: Secondary | ICD-10-CM | POA: Diagnosis present

## 2018-04-15 DIAGNOSIS — Y92009 Unspecified place in unspecified non-institutional (private) residence as the place of occurrence of the external cause: Secondary | ICD-10-CM

## 2018-04-15 DIAGNOSIS — F10929 Alcohol use, unspecified with intoxication, unspecified: Secondary | ICD-10-CM

## 2018-04-15 DIAGNOSIS — W19XXXA Unspecified fall, initial encounter: Secondary | ICD-10-CM | POA: Diagnosis present

## 2018-04-15 DIAGNOSIS — M79606 Pain in leg, unspecified: Secondary | ICD-10-CM | POA: Diagnosis present

## 2018-04-15 DIAGNOSIS — S065XAA Traumatic subdural hemorrhage with loss of consciousness status unknown, initial encounter: Secondary | ICD-10-CM

## 2018-04-15 LAB — CBC WITH DIFFERENTIAL/PLATELET
Abs Immature Granulocytes: 0.08 10*3/uL — ABNORMAL HIGH (ref 0.00–0.07)
Basophils Absolute: 0.2 10*3/uL — ABNORMAL HIGH (ref 0.0–0.1)
Basophils Relative: 2 %
Eosinophils Absolute: 0 10*3/uL (ref 0.0–0.5)
Eosinophils Relative: 0 %
HCT: 40.3 % (ref 39.0–52.0)
Hemoglobin: 13 g/dL (ref 13.0–17.0)
Immature Granulocytes: 1 %
Lymphocytes Relative: 17 %
Lymphs Abs: 1.7 10*3/uL (ref 0.7–4.0)
MCH: 27.5 pg (ref 26.0–34.0)
MCHC: 32.3 g/dL (ref 30.0–36.0)
MCV: 85.2 fL (ref 80.0–100.0)
Monocytes Absolute: 1.3 10*3/uL — ABNORMAL HIGH (ref 0.1–1.0)
Monocytes Relative: 13 %
Neutro Abs: 6.5 10*3/uL (ref 1.7–7.7)
Neutrophils Relative %: 67 %
Platelets: 474 10*3/uL — ABNORMAL HIGH (ref 150–400)
RBC: 4.73 MIL/uL (ref 4.22–5.81)
RDW: 21.8 % — ABNORMAL HIGH (ref 11.5–15.5)
WBC: 9.7 10*3/uL (ref 4.0–10.5)
nRBC: 0 % (ref 0.0–0.2)

## 2018-04-15 LAB — COMPREHENSIVE METABOLIC PANEL
ALK PHOS: 86 U/L (ref 38–126)
ALT: 32 U/L (ref 0–44)
AST: 37 U/L (ref 15–41)
Albumin: 3.5 g/dL (ref 3.5–5.0)
Anion gap: 14 (ref 5–15)
BUN: 8 mg/dL (ref 6–20)
CO2: 27 mmol/L (ref 22–32)
CREATININE: 0.8 mg/dL (ref 0.61–1.24)
Calcium: 8.6 mg/dL — ABNORMAL LOW (ref 8.9–10.3)
Chloride: 102 mmol/L (ref 98–111)
GFR calc Af Amer: >60 mL/min (ref >60)
GFR calc non Af Amer: >60 mL/min (ref >60)
Glucose, Bld: 129 mg/dL — ABNORMAL HIGH (ref 70–99)
Potassium: 3.3 mmol/L — ABNORMAL LOW (ref 3.5–5.1)
Sodium: 143 mmol/L (ref 135–145)
Total Bilirubin: 0.6 mg/dL (ref 0.3–1.2)
Total Protein: 6.5 g/dL (ref 6.5–8.1)

## 2018-04-15 LAB — PROTIME-INR
INR: 0.98
Prothrombin Time: 12.9 seconds (ref 11.4–15.2)

## 2018-04-15 LAB — GLUCOSE, CAPILLARY
Glucose-Capillary: 114 mg/dL — ABNORMAL HIGH (ref 70–99)
Glucose-Capillary: 132 mg/dL — ABNORMAL HIGH (ref 70–99)
Glucose-Capillary: 84 mg/dL (ref 70–99)

## 2018-04-15 LAB — MRSA PCR SCREENING: MRSA by PCR: NEGATIVE

## 2018-04-15 LAB — CK: Total CK: 589 U/L — ABNORMAL HIGH (ref 49–397)

## 2018-04-15 LAB — ETHANOL: Alcohol, Ethyl (B): 147 mg/dL — ABNORMAL HIGH (ref <10)

## 2018-04-15 LAB — TROPONIN I: Troponin I: <0.03 ng/mL (ref <0.03)

## 2018-04-15 MED ORDER — SODIUM CHLORIDE 0.9 % IV BOLUS
1000.0000 mL | Freq: Once | INTRAVENOUS | Status: AC
  Administered 2018-04-15: 1000 mL via INTRAVENOUS

## 2018-04-15 MED ORDER — INSULIN ASPART 100 UNIT/ML ~~LOC~~ SOLN: 0.0000 [IU] | Freq: Every day | SUBCUTANEOUS | Status: DC

## 2018-04-15 MED ORDER — LEVETIRACETAM 500 MG PO TABS
1000.0000 mg | ORAL_TABLET | Freq: Two times a day (BID) | ORAL | Status: DC
  Administered 2018-04-15 – 2018-04-16 (×3): 1000 mg via ORAL
  Filled 2018-04-15 (×4): qty 2

## 2018-04-15 MED ORDER — VITAMIN B-1 100 MG PO TABS
100.0000 mg | ORAL_TABLET | Freq: Every day | ORAL | Status: DC
  Administered 2018-04-15 – 2018-04-19 (×5): 100 mg via ORAL
  Filled 2018-04-15 (×4): qty 1

## 2018-04-15 MED ORDER — LORAZEPAM 2 MG/ML IJ SOLN
1.0000 mg | Freq: Four times a day (QID) | INTRAMUSCULAR | Status: AC | PRN
  Administered 2018-04-16: 1 mg via INTRAVENOUS
  Filled 2018-04-15: qty 1

## 2018-04-15 MED ORDER — METOPROLOL SUCCINATE ER 50 MG PO TB24
50.0000 mg | ORAL_TABLET | Freq: Every day | ORAL | Status: DC
  Administered 2018-04-15 – 2018-04-19 (×5): 50 mg via ORAL
  Filled 2018-04-15 (×5): qty 1

## 2018-04-15 MED ORDER — MYCOPHENOLATE MOFETIL 250 MG PO CAPS
500.0000 mg | ORAL_CAPSULE | Freq: Every morning | ORAL | Status: DC
  Administered 2018-04-15 – 2018-04-19 (×5): 500 mg via ORAL
  Filled 2018-04-15 (×5): qty 2

## 2018-04-15 MED ORDER — DILTIAZEM HCL ER COATED BEADS 180 MG PO CP24
180.0000 mg | ORAL_CAPSULE | Freq: Every day | ORAL | Status: DC
  Administered 2018-04-15 – 2018-04-19 (×5): 180 mg via ORAL
  Filled 2018-04-15 (×5): qty 1

## 2018-04-15 MED ORDER — B COMPLEX-C PO TABS
1.0000 | ORAL_TABLET | Freq: Every day | ORAL | Status: DC
  Administered 2018-04-15 – 2018-04-19 (×5): 1 via ORAL
  Filled 2018-04-15 (×5): qty 1

## 2018-04-15 MED ORDER — GABAPENTIN 300 MG PO CAPS
600.0000 mg | ORAL_CAPSULE | Freq: Three times a day (TID) | ORAL | Status: DC
  Administered 2018-04-15 – 2018-04-19 (×13): 600 mg via ORAL
  Filled 2018-04-15 (×13): qty 2

## 2018-04-15 MED ORDER — LORAZEPAM 2 MG/ML IJ SOLN
0.0000 mg | Freq: Four times a day (QID) | INTRAMUSCULAR | Status: DC
  Administered 2018-04-15: 2 mg via INTRAVENOUS
  Filled 2018-04-15: qty 1

## 2018-04-15 MED ORDER — INSULIN ASPART 100 UNIT/ML ~~LOC~~ SOLN
0.0000 [IU] | Freq: Three times a day (TID) | SUBCUTANEOUS | Status: DC
  Administered 2018-04-15: 1 [IU] via SUBCUTANEOUS
  Administered 2018-04-16 – 2018-04-17 (×2): 2 [IU] via SUBCUTANEOUS
  Administered 2018-04-17: 1 [IU] via SUBCUTANEOUS
  Administered 2018-04-18: 2 [IU] via SUBCUTANEOUS
  Administered 2018-04-18 – 2018-04-19 (×2): 1 [IU] via SUBCUTANEOUS
  Filled 2018-04-15 (×7): qty 1

## 2018-04-15 MED ORDER — DEXMEDETOMIDINE HCL IN NACL 400 MCG/100ML IV SOLN
0.2000 ug/kg/h | INTRAVENOUS | Status: DC
  Administered 2018-04-15: 0.2 ug/kg/h via INTRAVENOUS
  Filled 2018-04-15 (×2): qty 100

## 2018-04-15 MED ORDER — ADULT MULTIVITAMIN W/MINERALS CH
1.0000 | ORAL_TABLET | Freq: Every day | ORAL | Status: DC
  Administered 2018-04-15 – 2018-04-19 (×5): 1 via ORAL
  Filled 2018-04-15 (×5): qty 1

## 2018-04-15 MED ORDER — AMIODARONE HCL 200 MG PO TABS
200.0000 mg | ORAL_TABLET | Freq: Every day | ORAL | Status: DC
  Administered 2018-04-16 – 2018-04-19 (×4): 200 mg via ORAL
  Filled 2018-04-15 (×4): qty 1

## 2018-04-15 MED ORDER — MYCOPHENOLATE MOFETIL 500 MG PO TABS: 500.0000 mg | ORAL_TABLET | ORAL | Status: DC

## 2018-04-15 MED ORDER — ADULT MULTIVITAMIN W/MINERALS CH: 1.0000 | ORAL_TABLET | Freq: Every day | ORAL | Status: DC

## 2018-04-15 MED ORDER — BUSPIRONE HCL 15 MG PO TABS
15.0000 mg | ORAL_TABLET | Freq: Two times a day (BID) | ORAL | Status: DC
  Administered 2018-04-15 – 2018-04-19 (×9): 15 mg via ORAL
  Filled 2018-04-15 (×11): qty 1

## 2018-04-15 MED ORDER — VITAMIN B-1 100 MG PO TABS
100.0000 mg | ORAL_TABLET | Freq: Every day | ORAL | Status: DC
  Filled 2018-04-15: qty 1

## 2018-04-15 MED ORDER — OXYCODONE HCL 5 MG PO TABS
5.0000 mg | ORAL_TABLET | Freq: Four times a day (QID) | ORAL | Status: DC | PRN
  Administered 2018-04-15 – 2018-04-17 (×6): 10 mg via ORAL
  Filled 2018-04-15: qty 1
  Filled 2018-04-15 (×3): qty 2
  Filled 2018-04-15: qty 1
  Filled 2018-04-15 (×2): qty 2

## 2018-04-15 MED ORDER — ONDANSETRON HCL 4 MG PO TABS: 4.0000 mg | ORAL_TABLET | Freq: Four times a day (QID) | ORAL | Status: DC | PRN

## 2018-04-15 MED ORDER — MYCOPHENOLATE MOFETIL 250 MG PO CAPS
1000.0000 mg | ORAL_CAPSULE | Freq: Every day | ORAL | Status: DC
  Administered 2018-04-15 – 2018-04-18 (×4): 1000 mg via ORAL
  Filled 2018-04-15 (×6): qty 4

## 2018-04-15 MED ORDER — LOPERAMIDE HCL 2 MG PO CAPS
2.0000 mg | ORAL_CAPSULE | Freq: Four times a day (QID) | ORAL | Status: DC | PRN
  Administered 2018-04-15 – 2018-04-18 (×3): 2 mg via ORAL
  Filled 2018-04-15 (×3): qty 1

## 2018-04-15 MED ORDER — OXYCODONE-ACETAMINOPHEN 5-325 MG PO TABS
2.0000 | ORAL_TABLET | Freq: Once | ORAL | Status: AC
  Administered 2018-04-15: 2 via ORAL
  Filled 2018-04-15: qty 2

## 2018-04-15 MED ORDER — ACETAMINOPHEN 650 MG RE SUPP: 650.0000 mg | Freq: Four times a day (QID) | RECTAL | Status: DC | PRN

## 2018-04-15 MED ORDER — THIAMINE HCL 100 MG/ML IJ SOLN
INTRAVENOUS | Status: DC
  Administered 2018-04-15 – 2018-04-16 (×2): via INTRAVENOUS
  Filled 2018-04-15 (×5): qty 1000

## 2018-04-15 MED ORDER — LORAZEPAM 2 MG PO TABS
0.0000 mg | ORAL_TABLET | Freq: Four times a day (QID) | ORAL | Status: AC
  Administered 2018-04-15: 1 mg via ORAL
  Administered 2018-04-16 – 2018-04-17 (×3): 2 mg via ORAL
  Filled 2018-04-15 (×6): qty 1

## 2018-04-15 MED ORDER — NICOTINE 14 MG/24HR TD PT24
14.0000 mg | MEDICATED_PATCH | Freq: Every day | TRANSDERMAL | Status: DC
  Administered 2018-04-15 – 2018-04-19 (×5): 14 mg via TRANSDERMAL
  Filled 2018-04-15 (×5): qty 1

## 2018-04-15 MED ORDER — LORAZEPAM 1 MG PO TABS
1.0000 mg | ORAL_TABLET | Freq: Four times a day (QID) | ORAL | Status: AC | PRN
  Administered 2018-04-15: 1 mg via ORAL

## 2018-04-15 MED ORDER — ACETAMINOPHEN 325 MG PO TABS
650.0000 mg | ORAL_TABLET | Freq: Four times a day (QID) | ORAL | Status: DC | PRN
  Administered 2018-04-16 – 2018-04-18 (×3): 650 mg via ORAL
  Filled 2018-04-15 (×3): qty 2

## 2018-04-15 MED ORDER — FOLIC ACID 1 MG PO TABS
1.0000 mg | ORAL_TABLET | Freq: Every day | ORAL | Status: DC
  Administered 2018-04-15 – 2018-04-19 (×5): 1 mg via ORAL
  Filled 2018-04-15 (×5): qty 1

## 2018-04-15 MED ORDER — LORAZEPAM 2 MG PO TABS
0.0000 mg | ORAL_TABLET | Freq: Two times a day (BID) | ORAL | Status: AC
  Administered 2018-04-17: 1 mg via ORAL
  Filled 2018-04-15: qty 1

## 2018-04-15 MED ORDER — BISACODYL 5 MG PO TBEC
5.0000 mg | DELAYED_RELEASE_TABLET | Freq: Every day | ORAL | Status: DC | PRN
  Filled 2018-04-15: qty 1

## 2018-04-15 MED ORDER — HYDROCODONE-ACETAMINOPHEN 5-325 MG PO TABS
1.0000 | ORAL_TABLET | ORAL | Status: DC | PRN
  Administered 2018-04-15 (×2): 2 via ORAL
  Filled 2018-04-15 (×2): qty 2

## 2018-04-15 MED ORDER — ONDANSETRON HCL 4 MG/2ML IJ SOLN: 4.0000 mg | Freq: Four times a day (QID) | INTRAMUSCULAR | Status: DC | PRN

## 2018-04-15 MED ORDER — FOLIC ACID 1 MG PO TABS: 1.0000 mg | ORAL_TABLET | Freq: Every day | ORAL | Status: DC

## 2018-04-15 MED ORDER — THIAMINE HCL 100 MG/ML IJ SOLN: 100.0000 mg | Freq: Every day | INTRAMUSCULAR | Status: DC

## 2018-04-15 MED ORDER — METFORMIN HCL 500 MG PO TABS
500.0000 mg | ORAL_TABLET | Freq: Every day | ORAL | Status: DC
  Administered 2018-04-15: 500 mg via ORAL
  Filled 2018-04-15: qty 1

## 2018-04-15 MED ORDER — DOCUSATE SODIUM 100 MG PO CAPS
100.0000 mg | ORAL_CAPSULE | Freq: Two times a day (BID) | ORAL | Status: DC
  Administered 2018-04-17: 100 mg via ORAL
  Filled 2018-04-15 (×7): qty 1

## 2018-04-15 MED ORDER — THIAMINE HCL 100 MG/ML IJ SOLN
100.0000 mg | Freq: Every day | INTRAMUSCULAR | Status: DC
  Administered 2018-04-15: 100 mg via INTRAVENOUS
  Filled 2018-04-15: qty 2

## 2018-04-15 NOTE — ED Triage Notes (Signed)
EMS pt to rm 26 from home with report of chronic low back pain, alcohol abuse and possibly withdrawals. Pt says he has not drank in about 10 hours. Pt is noted to be shaking.

## 2018-04-15 NOTE — ED Provider Notes (Signed)
[facility] Emergency Department Provider Note   ____________________________________________   First MD Initiated Contact with Patient 04/15/18 [phone]     (approximate)  I have reviewed the triage vital signs and the nursing notes.   HISTORY  Chief Complaint Low back pain   HPI Kurt Horton is a 57 y.o. male brought to the ED from home via EMS with chief complaint.  Patient is a recalcitrant alcoholic who was recently admitted from 11/25-12/4 for DTs.  Last drink was 10 hours ago; over the past day he has had 20 airplane bottles of alcohol.  Did not report fall to EMS when I queried him he did falling with low back pain.  Unknown if he struck his head or suffered LOC.  Denies extremity weakness, numbness or tingling.  Denies headache, vision changes, neck pain, chest pain, shortness of breath, abdominal pain, nausea or vomiting.   Past Medical History:  Diagnosis Date  . A-fib (HCC)   . Alcohol abuse   . Alcohol withdrawal (HCC) 11/12/2016  . Diabetes mellitus without complication (HCC)   . DJD (degenerative joint disease)   . Hypertension   . Myasthenia gravis (HCC)   . Renal disorder     Patient Active Problem List   Diagnosis Date Noted  . GI bleed 02/04/2018  . Alcohol intoxication (HCC) 12/22/2017  . Malnutrition of moderate degree 10/04/2017  . Chronic combined systolic and diastolic CHF (congestive heart failure) (HCC) 08/13/2017  . Alcohol withdrawal delirium (HCC) 02/26/2017  . Atrial fibrillation with RVR (HCC) 02/25/2017  . Acute alcoholic intoxication with complication (HCC)   . Alcohol abuse   . Alcohol withdrawal (HCC) 11/13/2016  . Substance induced mood disorder (HCC) 11/13/2016  . SVT (supraventricular tachycardia) (HCC) 11/12/2016  . HTN (hypertension) 11/12/2016  . EtOH dependence (HCC) 11/12/2016  . Lumbar radiculopathy 08/24/2016  . Depression with anxiety 08/24/2016  . Chronic low back pain 01/04/2016  . Pressure ulcer  12/29/2015  . Osteomyelitis (HCC) 12/26/2015  . PVD (peripheral vascular disease) (HCC) 12/26/2015  . Type II diabetes mellitus with manifestations (HCC) 12/26/2015  . Myasthenia gravis (HCC) 12/26/2015    Past Surgical History:  Procedure Laterality Date  . AMPUTATION TOE Left 12/29/2015   Procedure: AMPUTATION TOE;  Surgeon: Gwyneth Revels, DPM;  Location: [facility] ORS;  Service: Podiatry;  Laterality: Left;  . ESOPHAGOGASTRODUODENOSCOPY N/A 02/05/2018   Procedure: ESOPHAGOGASTRODUODENOSCOPY (EGD);  Surgeon: Pasty Spillers, MD;  Location: [facility] ENDOSCOPY;  Service: Endoscopy;  Laterality: N/A;  . PERIPHERAL VASCULAR CATHETERIZATION N/A 12/27/2015   Procedure: Lower Extremity Angiography;  Surgeon: Annice Needy, MD;  Location: [facility] INVASIVE CV LAB;  Service: Cardiovascular;  Laterality: N/A;  . right hip surgery    . right shoulder surgery      Prior to Admission medications   Medication Sig Start Date End Date Taking? Authorizing Provider  amiodarone (PACERONE) 200 MG tablet Take 1 tablet (200 mg total) by mouth daily. 11/02/17   Alford Highland, MD  aspirin EC 325 MG tablet Take 325 mg by mouth daily.    [provider]  B Complex-C (B-COMPLEX WITH VITAMIN C) tablet Take 1 tablet by mouth daily. 11/02/17   Alford Highland, MD  busPIRone (BUSPAR) 15 MG tablet Take 1 tablet (15 mg total) by mouth 2 (two) times daily. 04/10/18   Ramonita Lab, MD  diltiazem (CARDIZEM CD) 180 MG 24 hr capsule Take 1 capsule (180 mg total) by mouth daily. 11/02/17 03/02/18  Alford Highland, MD  folic  acid (FOLVITE) 1 MG tablet Take 1 tablet (1 mg total) by mouth daily. 12/26/17   Mayo, Allyn Kenner, MD  furosemide (LASIX) 40 MG tablet Take 40 mg by mouth daily.    [provider]  gabapentin (NEURONTIN) 300 MG capsule Take 2 capsules (600 mg total) by mouth 3 (three) times daily. 11/02/17   Alford Highland, MD  lisinopril (PRINIVIL,ZESTRIL) 10 MG tablet Take 10 mg by mouth daily.    [provider]  metFORMIN (GLUCOPHAGE) 500 MG tablet Take 1 tablet (500 mg total) by mouth daily. 11/02/17   Alford Highland, MD  metoprolol succinate (TOPROL-XL) 50 MG 24 hr tablet Take 1 tablet (50 mg total) by mouth daily. Take with or immediately following a meal. 11/02/17   Alford Highland, MD  multivitamin (ONE-A-DAY MEN'S) TABS tablet Take 1 tablet by mouth daily. 11/02/17   Alford Highland, MD  mycophenolate (CELLCEPT) 500 MG tablet Take 1 tablet (500 mg total) by mouth 2 (two) times daily. Take 1 tablet (500MG ) by mouth every morning and 2 tablets (1000MG ) by mouth every evening Patient taking differently: Take 500-1,000 mg by mouth See admin instructions. 500 mg every morning and 1000 mg at bedtime 11/02/17   Alford Highland, MD  nicotine (NICODERM CQ - DOSED IN MG/24 HOURS) 14 mg/24hr patch Place 1 patch (14 mg total) onto the skin daily. 04/11/18   Gouru, Deanna Artis, MD  oxyCODONE (OXY IR/ROXICODONE) 5 MG immediate release tablet Take 1-2 tablets (5-10 mg total) by mouth every 4 (four) hours as needed for moderate pain or severe pain. Patient taking differently: Take 10 mg by mouth daily as needed for moderate pain or severe pain.  02/07/18   Katha Hamming, MD  senna-docusate (SENOKOT-S) 8.6-50 MG tablet Take 1 tablet by mouth at bedtime as needed for mild constipation. Patient taking differently: Take 1 tablet by mouth daily.  12/25/17   Mayo, Allyn Kenner, MD  testosterone cypionate (DEPOTESTOSTERONE CYPIONATE) 200 MG/ML injection Inject 200 mg into the muscle every 14 (fourteen) days.    [provider]  thiamine 100 MG tablet Take 1 tablet (100 mg total) by mouth daily. 11/02/17   Alford Highland, MD    Allergies Nsaids and Other  Family History  Problem Relation Age of Onset  . Rheum arthritis Father     Social History Social History   Tobacco Use  . Smoking status: Current Every Day Smoker    Packs/day: 0.50    Types: Cigarettes  . Smokeless tobacco: Never Used    Substance Use Topics  . Alcohol use: Yes    Comment: Pt had tequila and rum, at least a 5th or a pint a day  . Drug use: No    Review of Systems  Constitutional: No fever/chills Eyes: No visual changes. ENT: No sore throat. Cardiovascular: Denies chest pain. Respiratory: Denies shortness of breath. Gastrointestinal: No abdominal pain.  No nausea, no vomiting.  No diarrhea.  No constipation. Genitourinary: Negative for dysuria. Musculoskeletal: Positive for back pain. Skin: Negative for rash. Neurological: Negative for headaches, focal weakness or numbness.   ____________________________________________   PHYSICAL EXAM:  VITAL SIGNS: ED Triage Vitals  Enc Vitals Group     BP      Pulse      Resp      Temp      Temp src      SpO2      Weight      Height      Head Circumference  Peak Flow      Pain Score      Pain Loc      Pain Edu?      Excl. in GC?     Constitutional: Alert and oriented.  Disheveled appearing and in mild acute distress. Eyes: Conjunctivae are normal. PERRL. EOMI. Head: Atraumatic. Nose: Atraumatic. Mouth/Throat: Mucous membranes are moist.  No dental malocclusion. Neck: No stridor.  No cervical spine tenderness to palpation. Cardiovascular: Normal rate, regular rhythm. Grossly normal heart sounds.  Good peripheral circulation. Respiratory: Normal respiratory effort.  No retractions. Lungs CTAB. Gastrointestinal: Soft and nontender. No distention. No abdominal bruits. No CVA tenderness. Musculoskeletal: Lumbar spine tenderness to palpation.  Right buttock with contusion versus lividity with open blisters of weeping.  No lower extremity tenderness nor edema.  No joint effusions. Neurologic:  Normal speech and language. No gross focal neurologic deficits are appreciated.  Skin:  Skin is warm, dry and intact. No rash noted. Psychiatric: Mood and affect are normal. Speech and behavior are  normal.  ____________________________________________   LABS (all labs ordered are listed, but only abnormal results are displayed)  Labs Reviewed  CBC WITH DIFFERENTIAL/PLATELET - Abnormal; Notable for the following components:      Result Value   RDW 21.8 (*)    Platelets 474 (*)    Monocytes Absolute 1.3 (*)    Basophils Absolute 0.2 (*)    Abs Immature Granulocytes 0.08 (*)    All other components within normal limits  COMPREHENSIVE METABOLIC PANEL - Abnormal; Notable for the following components:   Potassium 3.3 (*)    Glucose, Bld 129 (*)    Calcium 8.6 (*)    All other components within normal limits  ETHANOL - Abnormal; Notable for the following components:   Alcohol, Ethyl (B) 147 (*)    All other components within normal limits  CK - Abnormal; Notable for the following components:   Total CK 589 (*)    All other components within normal limits  TROPONIN I  PROTIME-INR   ____________________________________________  EKG  ED ECG REPORT I, Tessla Spurling J, the attending physician, personally viewed and interpreted this ECG.   Date: 04/15/2018  EKG Time: 0539  Rate: 83  Rhythm: normal EKG, normal sinus rhythm  Axis: Normal  Intervals:none  ST&T Change: Nonspecific  ____________________________________________  RADIOLOGY  ED MD interpretation:  No ICH, unremarkable pelvis, DDD lumbar spine without acute fracture  Official radiology report(s): Dg Lumbar Spine Complete  Result Date: 04/15/2018 CLINICAL DATA:  Chronic low back pain, possible fall. EXAM: LUMBAR SPINE - COMPLETE 4+ VIEW COMPARISON:  MRI of the lumbar spine dated June 22, 2017 FINDINGS: The lumbar vertebral bodies are preserved in height. There's gentle dextrocurvature centered in the mid lumbar spine. There is moderate to severe disc space narrowing at L2-3 and milder disc space narrowing at L1-2 and L3-4. There's mild disc space narrowing at L4-5 with grade 1 anterolisthesis of L4 with respect  L5 which is stable. There's multilevel facet joint hypertrophy. There are anterior endplate osteophytes at L1-2 and L2-3. IMPRESSION: Multilevel degenerative disc disease with the greatest disc space height loss noted at L2-3. Persistent mild grade 1 anterolisthesis of L4 with respect L5. No acute fracture. Electronically Signed   By: David  Swazil[place]Jordan M.D.   On: 04/15/2018 06:53   Dg Pelvis 1-2 Views  Result Date: 04/15/2018 CLINICAL DATA:  Chronic low back pain, questionable fall. Pain in the lower abdominal and pelvic regions and proximal right femur. EXAM: PELVIS -  1-2 VIEW COMPARISON:  No recent studies in [facility] FINDINGS: The bony pelvis is subjectively adequately mineralized. 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 The acetabular and femoral components appear normal with no evidence of loosening. The native femur where visualized exhibits no acute abnormality. IMPRESSION: There is no acute or significant chronic bony abnormality of the pelvis. Electronically Signed   By: David  [place] M.D.   On: 04/15/2018 06:55   Ct Head Wo Contrast  Result Date: 04/15/2018 CLINICAL DATA:  Tremors.  Question alcohol withdrawal.  Recent fall EXAM: CT HEAD WITHOUT CONTRAST TECHNIQUE: Contiguous axial images were obtained from the base of the skull through the vertex without intravenous contrast. COMPARISON:  January 19, 2018 FINDINGS: Brain: Mild diffuse atrophy overall is stable. There is no intracranial mass, hemorrhage, extra-axial fluid collection, or midline shift. No acute infarct is evident. There is minimal small vessel disease in the centra semiovale bilaterally. Vascular: There is no evident hyperdense vessel. No abnormal calcification evident. Skull: The bony calvarium appears intact. Sinuses/Orbits: There is mucosal thickening in several ethmoid air cells. Other visualized paranasal sinuses are clear. Visualized orbits appear symmetric bilaterally. Other: Visualized mastoid air cells are clear. IMPRESSION: Stable  atrophy with minimal periventricular small vessel disease. No mass or hemorrhage. No acute infarct evident. Mucosal thickening noted in several ethmoid air cells. Electronically Signed   By: Bretta Bang III M.D.   On: 04/15/2018 07:07    ____________________________________________   PROCEDURES  Procedure(s) performed: None  Procedures  Critical Care performed: No  ____________________________________________   INITIAL IMPRESSION / ASSESSMENT AND PLAN / ED COURSE  As part of my medical decision making, I reviewed the following data within the electronic MEDICAL RECORD NUMBERNursing notes reviewed and incorporated, Labs reviewed, Old chart reviewed, Radiograph reviewed and Notes from prior ED visits   57 year old male with a history of alcohol abuse status post numerous admissions for DTs who presents with acute on chronic low back pain, possibly due to fall. Will obtain screening labwork, initiate CIWA scale, IV fluid resuscitation and reassess.  Clinical Course as of Apr 15 718  Mon Apr 15, 2018  1610 Patient sleeping after Ativan administration.  CT and x-ray imaging studies unremarkable.  Patient is not safe to discharge at this time.  Will continue period of observation in the ED. Good possibly patient may require hospitalization for DTs but currently does not meet criteria. Care transferred to Dr. Lenard Lance pending observation and disposition.   [JS]    Clinical Course User Index [JS] Irean Hong, MD     ____________________________________________   FINAL CLINICAL IMPRESSION(S) / ED DIAGNOSES  Final diagnoses:  Acute midline low back pain without sciatica  Acute alcoholic intoxication with complication (HCC)  ETOH abuse     ED Discharge Orders    None       Note:  This document was prepared using Dragon voice recognition software and may include unintentional dictation errors.    Irean Hong, MD 04/15/18 [phone]

## 2018-04-15 NOTE — ED Notes (Signed)
Per admitting MD, patient needs step down bed d/t possibility of DTs. Floor made aware.

## 2018-04-15 NOTE — H&P (Signed)
[employer] [facility] [place] [facility]   PATIENT NAME: Kurt Horton    MR#:  [id number]  DATE OF BIRTH:  [birth year]  DATE OF ADMISSION:  04/15/2018  PRIMARY CARE PHYSICIAN: [name], MD   REQUESTING/REFERRING PHYSICIAN: Minna Antis, MD  CHIEF COMPLAINT:   Chief Complaint  Patient presents with  . Alcohol Intoxication  . Withdrawal  . Back Pain    HISTORY OF PRESENT ILLNESS:  Kurt Horton  is a 57 y.o. male with a known history of alcoholism recently admitted from 11/25-12/4 for DTs.  Last drink was 10 hours ago; over the past day he has had 20 airplane bottles of alcohol. Reported falling with c/o low back pain.  Unknown if he struck his head or suffered LOC.  Underwent head CT which shows 6 mm right-sided subdural hematoma. EDP spoke with Dr. Adriana Simas, covering neurosurgeon, who states the patient could be admitted locally with a repeat CT scan greater than 6 hours from the initial CT scan and if unchanged would be safe for discharge from the subdural hematoma aspect.  However patient also has significant withdrawal symptoms currently, significant shaking and DTs history of DTs in the past so being admitted for further evaluation and management. PAST MEDICAL HISTORY:   Past Medical History:  Diagnosis Date  . A-fib (HCC)   . Alcohol abuse   . Alcohol withdrawal (HCC) 11/12/2016  . Diabetes mellitus without complication (HCC)   . DJD (degenerative joint disease)   . Hypertension   . Myasthenia gravis (HCC)   . Renal disorder     PAST SURGICAL HISTORY:   Past Surgical History:  Procedure Laterality Date  . AMPUTATION TOE Left 12/29/2015   Procedure: AMPUTATION TOE;  Surgeon: Gwyneth Revels, DPM;  Location: [facility] ORS;  Service: Podiatry;  Laterality: Left;  . ESOPHAGOGASTRODUODENOSCOPY N/A 02/05/2018   Procedure: ESOPHAGOGASTRODUODENOSCOPY (EGD);  Surgeon: Pasty Spillers, MD;  Location: [facility] ENDOSCOPY;  Service: Endoscopy;  Laterality: N/A;  .  PERIPHERAL VASCULAR CATHETERIZATION N/A 12/27/2015   Procedure: Lower Extremity Angiography;  Surgeon: Annice Needy, MD;  Location: [facility] INVASIVE CV LAB;  Service: Cardiovascular;  Laterality: N/A;  . right hip surgery    . right shoulder surgery      SOCIAL HISTORY:   Social History   Tobacco Use  . Smoking status: Current Every Day Smoker    Packs/day: 0.50    Types: Cigarettes  . Smokeless tobacco: Never Used  Substance Use Topics  . Alcohol use: Yes    Comment: Pt had tequila and rum, at least a 5th or a pint a day    FAMILY HISTORY:   Family History  Problem Relation Age of Onset  . Rheum arthritis Father     DRUG ALLERGIES:   Allergies  Allergen Reactions  . Nsaids Other (See Comments)    Duodenal ulcers, GI bleeding  . Other Other (See Comments)    Patient states he can't take any "mycin" medications because myasthenia gravis.    REVIEW OF SYSTEMS:   Review of Systems  Constitutional: Negative for chills, fever and weight loss.  HENT: Negative for nosebleeds and sore throat.   Eyes: Negative for blurred vision.  Respiratory: Negative for cough, shortness of breath and wheezing.   Cardiovascular: Negative for chest pain, orthopnea, leg swelling and PND.  Gastrointestinal: Negative for abdominal pain, constipation, diarrhea, heartburn, nausea and vomiting.  Genitourinary: Negative for dysuria and urgency.  Musculoskeletal: Positive for back pain and falls.  Skin: Negative for  rash.  Neurological: Positive for tremors. Negative for dizziness, speech change, focal weakness and headaches.  Endo/Heme/Allergies: Does not bruise/bleed easily.  Psychiatric/Behavioral: Negative for depression.   MEDICATIONS AT HOME:   Prior to Admission medications   Medication Sig Start Date End Date Taking? Authorizing Provider  amiodarone (PACERONE) 200 MG tablet Take 1 tablet (200 mg total) by mouth daily. 11/02/17   Alford Highland, MD  aspirin EC 325 MG tablet Take 325 mg by  mouth daily.    [provider]  B Complex-C (B-COMPLEX WITH VITAMIN C) tablet Take 1 tablet by mouth daily. 11/02/17   Alford Highland, MD  busPIRone (BUSPAR) 15 MG tablet Take 1 tablet (15 mg total) by mouth 2 (two) times daily. 04/10/18   Ramonita Lab, MD  diltiazem (CARDIZEM CD) 180 MG 24 hr capsule Take 1 capsule (180 mg total) by mouth daily. 11/02/17 03/02/18  Alford Highland, MD  folic acid (FOLVITE) 1 MG tablet Take 1 tablet (1 mg total) by mouth daily. 12/26/17   Mayo, Allyn Kenner, MD  furosemide (LASIX) 40 MG tablet Take 40 mg by mouth daily.    [provider]  gabapentin (NEURONTIN) 300 MG capsule Take 2 capsules (600 mg total) by mouth 3 (three) times daily. 11/02/17   Alford Highland, MD  lisinopril (PRINIVIL,ZESTRIL) 10 MG tablet Take 10 mg by mouth daily.    [provider]  metFORMIN (GLUCOPHAGE) 500 MG tablet Take 1 tablet (500 mg total) by mouth daily. 11/02/17   Alford Highland, MD  metoprolol succinate (TOPROL-XL) 50 MG 24 hr tablet Take 1 tablet (50 mg total) by mouth daily. Take with or immediately following a meal. 11/02/17   Alford Highland, MD  multivitamin (ONE-A-DAY MEN'S) TABS tablet Take 1 tablet by mouth daily. 11/02/17   Alford Highland, MD  mycophenolate (CELLCEPT) 500 MG tablet Take 1 tablet (500 mg total) by mouth 2 (two) times daily. Take 1 tablet (500MG ) by mouth every morning and 2 tablets (1000MG ) by mouth every evening Patient taking differently: Take 500-1,000 mg by mouth See admin instructions. 500 mg every morning and 1000 mg at bedtime 11/02/17   Alford Highland, MD  nicotine (NICODERM CQ - DOSED IN MG/24 HOURS) 14 mg/24hr patch Place 1 patch (14 mg total) onto the skin daily. 04/11/18   Gouru, Deanna Artis, MD  oxyCODONE (OXY IR/ROXICODONE) 5 MG immediate release tablet Take 1-2 tablets (5-10 mg total) by mouth every 4 (four) hours as needed for moderate pain or severe pain. Patient taking differently: Take 10 mg by mouth daily as needed for  moderate pain or severe pain.  02/07/18   Katha Hamming, MD  senna-docusate (SENOKOT-S) 8.6-50 MG tablet Take 1 tablet by mouth at bedtime as needed for mild constipation. Patient taking differently: Take 1 tablet by mouth daily.  12/25/17   Mayo, Allyn Kenner, MD  testosterone cypionate (DEPOTESTOSTERONE CYPIONATE) 200 MG/ML injection Inject 200 mg into the muscle every 14 (fourteen) days.    [provider]  thiamine 100 MG tablet Take 1 tablet (100 mg total) by mouth daily. 11/02/17   Alford Highland, MD      VITAL SIGNS:  Blood pressure 138/90, pulse 86, temperature 98.4 F (36.9 C), temperature source Oral, resp. rate (!) 21, height 5\' 11"  (1.803 m), weight 86.6 kg, SpO2 100 %.  PHYSICAL EXAMINATION:  Physical Exam  GENERAL:  57 y.o.-year-old patient lying in the bed with no acute distress.  EYES: Pupils equal, round, reactive to light and accommodation. No scleral icterus. Extraocular  muscles intact.  HEENT: Head atraumatic, normocephalic. Oropharynx and nasopharynx clear.  NECK:  Supple, no jugular venous distention. No thyroid enlargement, no tenderness.  LUNGS: Normal breath sounds bilaterally, no wheezing, rales,rhonchi or crepitation. No use of accessory muscles of respiration.  CARDIOVASCULAR: S1, S2 normal. No murmurs, rubs, or gallops.  ABDOMEN: Soft, nontender, nondistended. Bowel sounds present. No organomegaly or mass.  EXTREMITIES: No pedal edema, cyanosis, or clubbing. Lumbar spine tenderness to palpation. Right buttock with contusion versus lividity with open blisters of weeping NEUROLOGIC: Cranial nerves II through XII are intact. Muscle strength 5/5 in all extremities. Sensation intact. Gait not checked. Some shaking and hand tremors PSYCHIATRIC: The patient is alert and oriented x 3.  SKIN: No obvious rash, lesion, or ulcer.  LABORATORY PANEL:   CBC Recent Labs  Lab 04/15/18 0548  WBC 9.7  HGB 13.0  HCT 40.3  PLT 474*    ------------------------------------------------------------------------------------------------------------------  Chemistries  Recent Labs  Lab 04/15/18 0548  NA 143  K 3.3*  CL 102  CO2 27  GLUCOSE 129*  BUN 8  CREATININE 0.80  CALCIUM 8.6*  AST 37  ALT 32  ALKPHOS 86  BILITOT 0.6   ------------------------------------------------------------------------------------------------------------------  Cardiac Enzymes Recent Labs  Lab 04/15/18 0548  TROPONINI <0.03   ------------------------------------------------------------------------------------------------------------------  RADIOLOGY:  Dg Lumbar Spine Complete  Result Date: 04/15/2018 CLINICAL DATA:  Chronic low back pain, possible fall. EXAM: LUMBAR SPINE - COMPLETE 4+ VIEW COMPARISON:  MRI of the lumbar spine dated June 22, 2017 FINDINGS: The lumbar vertebral bodies are preserved in height. There's gentle dextrocurvature centered in the mid lumbar spine. There is moderate to severe disc space narrowing at L2-3 and milder disc space narrowing at L1-2 and L3-4. There's mild disc space narrowing at L4-5 with grade 1 anterolisthesis of L4 with respect L5 which is stable. There's multilevel facet joint hypertrophy. There are anterior endplate osteophytes at L1-2 and L2-3. IMPRESSION: Multilevel degenerative disc disease with the greatest disc space height loss noted at L2-3. Persistent mild grade 1 anterolisthesis of L4 with respect L5. No acute fracture. Electronically Signed   By: David  SwazilandJordan M.D.   On: 04/15/2018 06:53   Dg Pelvis 1-2 Views  Result Date: 04/15/2018 CLINICAL DATA:  Chronic low back pain, questionable fall. Pain in the lower abdominal and pelvic regions and proximal right femur. EXAM: PELVIS - 1-2 VIEW COMPARISON:  [facility] FINDINGS: The bony pelvis is subjectively adequately mineralized. 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 The acetabular and femoral components appear normal with no  evidence of loosening. The native femur where visualized exhibits no acute abnormality. IMPRESSION: There is no acute or significant chronic bony abnormality of the pelvis. Electronically Signed   By: David  [place]azil[place]Jordan M.D.   On: 04/15/2018 06:55   Ct Head Wo Contrast  Addendum Date: 04/15/2018   ADDENDUM REPORT: 04/15/2018 08:38 ADDENDUM: On further review, there is a subdural hematoma on the right measuring 6 mm in maximum thickness. This hematoma involves portions of the temporal and parietal lobes on the right. It causes minimal mass effect but no midline shift. No other hemorrhage evident. Critical Value/emergent results were called by telephone at the time of interpretation on 04/15/2018 at 8:36 am to Dr. Elita QuickKevin Padukowski , who verbally acknowledged these results. Electronically Signed   By: Bretta BangWilliam  Woodruff III M.D.   On: 04/15/2018 08:38   Result Date: 04/15/2018 CLINICAL DATA:  Tremors.  Question alcohol withdrawal.  Recent fall EXAM: CT HEAD WITHOUT CONTRAST TECHNIQUE:  Contiguous axial images were obtained from the base of the skull through the vertex without intravenous contrast. COMPARISON:  January 19, 2018 FINDINGS: Brain: Mild diffuse atrophy overall is stable. There is no intracranial mass, hemorrhage, extra-axial fluid collection, or midline shift. No acute infarct is evident. There is minimal small vessel disease in the centra semiovale bilaterally. Vascular: There is no evident hyperdense vessel. No abnormal calcification evident. Skull: The bony calvarium appears intact. Sinuses/Orbits: There is mucosal thickening in several ethmoid air cells. Other visualized paranasal sinuses are clear. Visualized orbits appear symmetric bilaterally. Other: Visualized mastoid air cells are clear. IMPRESSION: Stable atrophy with minimal periventricular small vessel disease. No mass or hemorrhage. No acute infarct evident. Mucosal thickening noted in several ethmoid air cells. Electronically Signed: By:  Bretta Bang III M.D. On: 04/15/2018 07:07   IMPRESSION AND PLAN:  62 y m admitted for alcohol withdrawal and subdural hematoma  * Alcohol withdrawal - CIWA protocol, if worsens - consider step-down, there are no Step-down beds currently per charge nurse so will monitor on floor  * Subdural hematoma - CT head shows 6 mm right-sided subdural hematoma. EDP spoke with Dr. Adriana Simas, covering neurosurgeon, who states the patient could be admitted locally with a repeat CT scan greater than 6 hours from the initial CT scan and if unchanged would be safe for discharge from the subdural hematoma aspect, if need will c/s Neurosurgery  * Hypokalemia Replete and recheck  * Chronic atrial fibrillation Rate controlled. Continue amiodarone and Cardizem - hold any asa compounds, also anticoagulants  * Essential hypertension  - controlled on home meds   * chronic low back pain  - pain meds as need    Looks critically sick, high risk for active alcohol withdrawal requiring precedex drip, worsening SDH and transfer to step-down. Currently no beds available  All the records are reviewed and case discussed with ED provider. Management plans discussed with the patient, family and they are in agreement.  CODE STATUS: FULL CODE  TOTAL TIME (Critical Care) TAKING CARE OF THIS PATIENT: 45 minutes.    Delfino Lovett M.D on 04/15/2018 at 9:25 AM  Between 7am to 6pm - Pager - [phone]  After 6pm go to [email or web address] - Social research officer, government  [employer]  Office  [phone]  CC: Primary care physician; [name], MD   Note: This dictation was prepared with Dragon dictation along with smaller phrase technology. Any transcriptional errors that result from this process are unintentional.

## 2018-04-15 NOTE — Care Management Note (Signed)
Case Management Note  Patient Details  Name: Kurt Horton MRN: [id number] Date of Birth: 06-Jun-1960  Subjective/Objective:      Patient admitted to the ICU after fall with subdural hematoma.  Patient has been admitted multiple times in the past for alcohol withdrawal.  Patient states he does not remember falling last night.  Patient states he wants to quit drinking and needs medications to help stop, patient states it gets really bad the 2nd and 3rd day of withdrawal.  Patient reports that he has been to rehab 3 times in the past most recent [place] in [place].  Patient reports that he lives in [place] with his father, he is retired.  Patient reports that he uses a cane to help with ambulation, he can drive and has a license but doesn't because he has bad neuropathy.  Patient reports that he uses [employer] if he needs a ride.  Dr. Marcello FennelHande verified as the PCP, pharmacy is [facility] in [place].  [facility] for substance abuse resources.  Patient is interested in inpatient rehab to quit drinking.  RNCM will cont to follow. Robbie LisJeanna Parris Cudworth RN BSN [phone]              Action/Plan:   Expected Discharge Date:                  Expected Discharge Plan:     In-House Referral:     Discharge planning Services     Post Acute Care Choice:    Choice offered to:     DME Arranged:    DME Agency:     HH Arranged:    HH Agency:     Status of Service:  In process, will continue to follow  If discussed at Long Length of Stay Meetings, dates discussed:    Additional Comments:  Allayne ButcherJeanna M Traye Bates, RN 04/15/2018, 1:00 PM

## 2018-04-15 NOTE — Consult Note (Signed)
Reason for Consult: Assistance with management of DTs in a high risk patient Referring Physician: Erroll LunaShah,   Kurt Horton is an 57 y.o. male.  HPI:  57 year old current smoker with alcoholism who presented to the emergency room after a fall at home.  It is uncertain when he fell.  During the evaluation in the emergency room it was noted that he had a 6 mm subdural hematoma on the right temporal area.  The patient also appeared to be withdrawing from alcohol.  The patient at the time of the evaluation appears to be very tremulous states that he has to drink "all the time" so that he will not go into DTs.  He is aware that he is tremulous and feels that withdrawal is imminent.  He does not describe any fevers, chills or sweats.  His only complaint is that of back pain which is chronic for him he also complains of numbness on his lower extremities due to diabetic neuropathy.    Past Medical History:  Diagnosis Date  . A-fib (HCC)   . Alcohol abuse   . Alcohol withdrawal (HCC) 11/12/2016  . Diabetes mellitus without complication (HCC)   . DJD (degenerative joint disease)   . Hypertension   . Myasthenia gravis (HCC)   . Renal disorder     Past Surgical History:  Procedure Laterality Date  . AMPUTATION TOE Left 12/29/2015   Procedure: AMPUTATION TOE;  Surgeon: Gwyneth RevelsJustin Fowler, DPM;  Location: [facility] ORS;  Service: Podiatry;  Laterality: Left;  . ESOPHAGOGASTRODUODENOSCOPY N/A 02/05/2018   Procedure: ESOPHAGOGASTRODUODENOSCOPY (EGD);  Surgeon: Pasty Spillersahiliani, Varnita B, MD;  [facility];  Service: Endoscopy;  Laterality: N/A;  . PERIPHERAL VASCULAR CATHETERIZATION N/A 12/27/2015   Procedure: Lower Extremity Angiography;  Surgeon: Annice NeedyJason S Dew, MD;  Location: [facility] INVASIVE CV LAB;  Service: Cardiovascular;  Laterality: N/A;  . right hip surgery    . right shoulder surgery      Family History  Problem Relation Age of Onset  . Rheum arthritis Father     Social History:  reports that he has  been smoking cigarettes. He has been smoking about 0.50 packs per day. He has never used smokeless tobacco. He reports that he drinks alcohol. He reports that he does not use drugs.  Allergies:  Allergies  Allergen Reactions  . Nsaids Other (See Comments)    Duodenal ulcers, GI bleeding  . Other Other (See Comments)    Patient states he can't take any "mycin" medications because myasthenia gravis.    Medications: I have reviewed the patient's current medications.  Results for orders placed or performed during the hospital encounter of 04/15/18 (from the past 48 hour(s))  CBC with Differential     Status: Abnormal   Collection Time: 04/15/18  5:48 AM  Result Value Ref Range   WBC 9.7 4.0 - 10.5 K/uL   RBC 4.73 4.22 - 5.81 MIL/uL   Hemoglobin 13.0 13.0 - 17.0 g/dL   HCT 40.940.3 81.139.0 - 91.452.0 %   MCV 85.2 80.0 - 100.0 fL   MCH 27.5 26.0 - 34.0 pg   MCHC 32.3 30.0 - 36.0 g/dL   RDW 78.221.8 (H) 95.611.5 - 21.315.5 %   Platelets 474 (H) 150 - 400 K/uL   nRBC 0.0 0.0 - 0.2 %   Neutrophils Relative % 67 %   Neutro Abs 6.5 1.7 - 7.7 K/uL   Lymphocytes Relative 17 %   Lymphs Abs 1.7 0.7 - 4.0 K/uL   Monocytes Relative 13 %  Monocytes Absolute 1.3 (H) 0.1 - 1.0 K/uL   Eosinophils Relative 0 %   Eosinophils Absolute 0.0 0.0 - 0.5 K/uL   Basophils Relative 2 %   Basophils Absolute 0.2 (H) 0.0 - 0.1 K/uL   RBC Morphology See Note     Comment: ANISOCYTOSIS   Immature Granulocytes 1 %   Abs Immature Granulocytes 0.08 (H) 0.00 - 0.07 K/uL    Comment: Performed at [facility], [address] [place], [place] [address]  Comprehensive metabolic panel     Status: Abnormal   Collection Time: 04/15/18  5:48 AM  Result Value Ref Range   Sodium 143 135 - 145 mmol/L   Potassium 3.3 (L) 3.5 - 5.1 mmol/L   Chloride 102 98 - 111 mmol/L   CO2 27 22 - 32 mmol/L   Glucose, Bld 129 (H) 70 - 99 mg/dL   BUN 8 6 - 20 mg/dL   Creatinine, Ser 6.04 0.61 - 1.24 mg/dL   Calcium 8.6 (L) 8.9 - 10.3 mg/dL    Total Protein 6.5 6.5 - 8.1 g/dL   Albumin 3.5 3.5 - 5.0 g/dL   AST 37 15 - 41 U/L   ALT 32 0 - 44 U/L   Alkaline Phosphatase 86 38 - 126 U/L   Total Bilirubin 0.6 0.3 - 1.2 mg/dL   GFR calc non Af Amer >60 >60 mL/min   GFR calc Af Amer >60 >60 mL/min   Anion gap 14 5 - 15    Comment: Performed at [facility], [address], [place], [place] [address]  Ethanol     Status: Abnormal   Collection Time: 04/15/18  5:48 AM  Result Value Ref Range   Alcohol, Ethyl (B) 147 (H) <10 mg/dL    Comment: (NOTE) Lowest detectable limit for serum alcohol is 10 mg/dL. For medical purposes only. Performed at [facility], [address] [place], [place] [address]   Troponin I - Once     Status: None   Collection Time: 04/15/18  5:48 AM  Result Value Ref Range   Troponin I <0.03 <0.03 ng/mL    Comment: Performed at [facility], [address] [place], [place] [address]  CK     Status: Abnormal   Collection Time: 04/15/18  5:48 AM  Result Value Ref Range   Total CK 589 (H) 49 - 397 U/L    Comment: Performed at [facility], [address] [place], [place] [address]  Protime-INR     Status: None   Collection Time: 04/15/18  5:48 AM  Result Value Ref Range   Prothrombin Time 12.9 11.4 - 15.2 seconds   INR 0.98     Comment: Performed at [facility], [address] [place], [place] [address]  Glucose, capillary     Status: Abnormal   Collection Time: 04/15/18 11:51 AM  Result Value Ref Range   Glucose-Capillary 114 (H) 70 - 99 mg/dL  MRSA PCR Screening     Status: None   Collection Time: 04/15/18 11:53 AM  Result Value Ref Range   MRSA by PCR NEGATIVE NEGATIVE    Comment:        The GeneXpert MRSA Assay (FDA approved for NASAL specimens only), is one component of a comprehensive MRSA colonization surveillance program. It is not intended to diagnose MRSA infection nor to guide or monitor treatment for MRSA infections. Performed at  [facility], [address], [place], [place] [address]   Glucose,  capillary     Status: Abnormal   Collection Time: 04/15/18  4:24 PM  Result Value Ref Range   Glucose-Capillary 132 (H) 70 - 99 mg/dL    Dg Lumbar Spine Complete  Result Date: 04/15/2018 CLINICAL DATA:  Chronic low back pain, possible fall. EXAM: LUMBAR SPINE - COMPLETE 4+ VIEW COMPARISON:  MRI of the lumbar spine dated June 22, 2017 FINDINGS: The lumbar vertebral bodies are preserved in height. There's gentle dextrocurvature centered in the mid lumbar spine. There is moderate to severe disc space narrowing at L2-3 and milder disc space narrowing at L1-2 and L3-4. There's mild disc space narrowing at L4-5 with grade 1 anterolisthesis of L4 with respect L5 which is stable. There's multilevel facet joint hypertrophy. There are anterior endplate osteophytes at L1-2 and L2-3. IMPRESSION: Multilevel degenerative disc disease with the greatest disc space height loss noted at L2-3. Persistent mild grade 1 anterolisthesis of L4 with respect L5. No acute fracture. Electronically Signed   By: David  Swaz[place]and M.D.   On: 04/15/2018 06:53   Dg Pelvis 1-2 Views  Result Date: 04/15/2018 CLINICAL DATA:  Chronic low back pain, questionable fall. Pain in the lower abdominal and pelvic regions and proximal right femur. EXAM: PELVIS - 1-2 VIEW COMPARISON:  No recent studies in [facility] FINDINGS: The bony pelvis is subjectively adequately mineralized. 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 The acetabular and femoral components appear normal with no evidence of loosening. The native femur where visualized exhibits no acute abnormality. IMPRESSION: There is no acute or significant chronic bony abnormality of the pelvis. Electronically Signed   By: David  [place]and M.D.   On: 04/15/2018 06:55   Ct Head Wo Contrast  Result Date: 04/15/2018 CLINICAL DATA:  Follow-up subdural hemorrhage EXAM: CT HEAD WITHOUT CONTRAST TECHNIQUE: Contiguous axial images  were obtained from the base of the skull through the vertex without intravenous contrast. COMPARISON:  Earlier today FINDINGS: Brain: Unchanged extent of subdural hematoma along the majority of the right cerebral convexity, primarily high-density and measuring up to 6 mm in thickness. Local mass effect without midline shift or entrapment. No infarct or parenchymal hemorrhage. Vascular: Negative Skull: Negative for fracture Sinuses/Orbits: No evidence of injury IMPRESSION: Unchanged subdural hematoma along the right cerebral convexity measuring up to 6 mm in thickness. Electronically Signed   By: Marnee Spring M.D.   On: 04/15/2018 15:20   Ct Head Wo Contrast  Addendum Date: 04/15/2018   ADDENDUM REPORT: 04/15/2018 08:38 ADDENDUM: On further review, there is a subdural hematoma on the right measuring 6 mm in maximum thickness. This hematoma involves portions of the temporal and parietal lobes on the right. It causes minimal mass effect but no midline shift. No other hemorrhage evident. Critical Value/emergent results were called by telephone at the time of interpretation on 04/15/2018 at 8:36 am to Dr. Elita Quick , who verbally acknowledged these results. Electronically Signed   By: Bretta Bang III M.D.   On: 04/15/2018 08:38   Result Date: 04/15/2018 CLINICAL DATA:  Tremors.  Question alcohol withdrawal.  Recent fall EXAM: CT HEAD WITHOUT CONTRAST TECHNIQUE: Contiguous axial images were obtained from the base of the skull through the vertex without intravenous contrast. COMPARISON:  January 19, 2018 FINDINGS: Brain: Mild diffuse atrophy overall is stable. There is no intracranial mass, hemorrhage, extra-axial fluid collection, or midline shift. No acute infarct is evident. There is minimal small vessel disease in the centra semiovale bilaterally. Vascular: There is no evident hyperdense vessel. No abnormal calcification evident.  Skull: The bony calvarium appears intact. Sinuses/Orbits: There is  mucosal thickening in several ethmoid air cells. Other visualized paranasal sinuses are clear. Visualized orbits appear symmetric bilaterally. Other: Visualized mastoid air cells are clear. IMPRESSION: Stable atrophy with minimal periventricular small vessel disease. No mass or hemorrhage. No acute infarct evident. Mucosal thickening noted in several ethmoid air cells. Electronically Signed: By: Bretta Bang III M.D. On: 04/15/2018 07:07    Review of Systems  HENT: Positive for tinnitus.   Eyes: Negative.   Respiratory: Negative.   Cardiovascular: Positive for leg swelling.  Gastrointestinal: Positive for diarrhea.  Genitourinary: Negative.   Musculoskeletal: Positive for back pain.  Neurological: Positive for tingling and tremors.  Endo/Heme/Allergies: Negative.   Psychiatric/Behavioral: Positive for substance abuse.  All other systems reviewed and are negative.  Blood pressure 91/60, pulse 70, temperature 98.8 F (37.1 C), temperature source Oral, resp. rate 11, height 5\' 11"  (1.803 m), weight 87 kg, SpO2 96 %. Physical Exam  Nursing note and vitals reviewed. Constitutional: He is oriented to person, place, and time. He appears well-developed. He appears ill.  HENT:  Head: Normocephalic and atraumatic.  Right Ear: External ear normal.  Left Ear: External ear normal.  Nose: Nose normal.  Mouth/Throat: Oropharynx is clear and moist.  No obvious evidence of trauma  Eyes: Pupils are equal, round, and reactive to light. Conjunctivae and EOM are normal. No scleral icterus.  Neck: Neck supple. No JVD present. No tracheal deviation present. No thyromegaly present.  Cardiovascular: Normal rate, regular rhythm, normal heart sounds and intact distal pulses.  No murmur heard. Respiratory: Effort normal and breath sounds normal. No stridor.  GI: Soft. Bowel sounds are normal. He exhibits no distension. There is no tenderness. There is no rebound and no guarding.  Musculoskeletal: Normal  range of motion. He exhibits no edema or tenderness.  Multiple amputated toes.  Lymphadenopathy:    He has no cervical adenopathy.  Neurological: He is alert and oriented to person, place, and time. He displays tremor. A sensory deficit is present. No cranial nerve deficit.  Has peripheral neuropathy  Skin: Skin is warm and dry. No rash noted. He is not diaphoretic. No erythema. No pallor.  Psychiatric: His mood appears anxious. His speech is tangential.    Assessment/Plan:  1.  Alcohol withdrawal, supportive care with IV fluids, thiamine, electrolyte replacement, will start low-dose Precedex and place the patient on CIWA scale.  2.  6 mm right subdural hematoma: Close monitoring.  Per neurosurgery no need to intervene at present however, the patient may deteriorate.  Close monitoring.  3.  Alcohol abuse, patient has frequent admissions for DTs.  Strongly recommend rehab may need inpatient rehab.  4.  Diabetes mellitus type 2 insulin requiring, sliding scale.  5.  Tobacco abuse and dependence due to cigarettes, counseled.      Sarina Ser 04/15/2018, 10:05 PM

## 2018-04-15 NOTE — ED Provider Notes (Signed)
-----------------------------------------   8:48 AM on 04/15/2018 -----------------------------------------  Patient CT scan from overnight initially read as negative, an addendum was made showing a 6 mm right-sided subdural hematoma, the radiologist called me regarding this.  I spoke with Dr. Adriana Simasook, covering neurosurgeon, who states the patient could be admitted locally with a repeat CT scan greater than 6 hours from the initial CT scan and if unchanged would be safe for discharge from the subdural hematoma aspect.  However patient also has significant withdrawal symptoms currently, significant shaking and DTs history of DTs in the past.  Currently on [facility] protocol.  Patient will likely require admission to the hospitalist service.  I discussed the patient with the hospitalist they will be admitting to their service.  Patient agreeable to plan of care.   Minna AntisPaduchowski, Anagha Loseke, MD 04/15/18 [phone]

## 2018-04-15 NOTE — ED Notes (Signed)
Patient taken off of bedpan and cleaned with wipes. Clean chux placed. Patient produced small amount of watery brown stool. Redness and skin breakdown noted to patient's right hip. Patient states this is from where he "passed out and laid in urine for too long".

## 2018-04-15 NOTE — Consult Note (Signed)
Referring Physician:  No referring provider defined for this encounter.  Primary Physician:  Barbette ReichmannHande, Vishwanath, MD  Chief Complaint: Subdural hematoma  History of Present Illness: Kurt PhenesJohn D Horton is a 57 y.o. male with history of alcohol abuse who presents as a consult for subdural hematoma.  States that he experienced a fall last night and presented to the emergency department this morning.  He is attempting to quit alcohol consumption.  Denies headache, vision changes, confusion, changes in speech, weakness.  Review of Systems:  A 10 point review of systems is negative, except for the pertinent positives and negatives detailed in the HPI.  Past Medical History: Past Medical History:  Diagnosis Date  . A-fib (HCC)   . Alcohol abuse   . Alcohol withdrawal (HCC) 11/12/2016  . Diabetes mellitus without complication (HCC)   . DJD (degenerative joint disease)   . Hypertension   . Myasthenia gravis (HCC)   . Renal disorder     Past Surgical History: Past Surgical History:  Procedure Laterality Date  . AMPUTATION TOE Left 12/29/2015   Procedure: AMPUTATION TOE;  Surgeon: Gwyneth RevelsJustin Fowler, DPM;  Location: [facility] ORS;  Service: Podiatry;  Laterality: Left;  . ESOPHAGOGASTRODUODENOSCOPY N/A 02/05/2018   Procedure: ESOPHAGOGASTRODUODENOSCOPY (EGD);  Surgeon: Pasty Spillersahiliani, Varnita B, MD;  [facility];  Service: Endoscopy;  Laterality: N/A;  . PERIPHERAL VASCULAR CATHETERIZATION N/A 12/27/2015   Procedure: Lower Extremity Angiography;  Surgeon: Annice NeedyJason S Dew, MD;  Location: [facility] INVASIVE CV LAB;  Service: Cardiovascular;  Laterality: N/A;  . right hip surgery    . right shoulder surgery      Allergies: Allergies as of 04/15/2018 - Review Complete 04/15/2018  Allergen Reaction Noted  . Nsaids Other (See Comments) 01/09/2017  . Other Other (See Comments) 12/26/2015    Medications:  Current Facility-Administered Medications:  .  levETIRAcetam (KEPPRA) tablet 1,000 mg, 1,000 mg, Oral,  BID, Minna AntisPaduchowski, Kevin, MD, 1,000 mg at 04/15/18 0900 .  LORazepam (ATIVAN) injection 0-4 mg, 0-4 mg, Intravenous, Q6H, Irean HongSung, Jade J, MD, 2 mg at 04/15/18 0600 .  thiamine (B-1) injection 100 mg, 100 mg, Intravenous, Daily, Dolores FrameSung, Jade J, MD, 100 mg at 04/15/18 0901  Current Outpatient Medications:  .  amiodarone (PACERONE) 200 MG tablet, Take 1 tablet (200 mg total) by mouth daily., Disp: 30 tablet, Rfl: 0 .  aspirin EC 325 MG tablet, Take 325 mg by mouth daily., Disp: , Rfl:  .  B Complex-C (B-COMPLEX WITH VITAMIN C) tablet, Take 1 tablet by mouth daily., Disp: 30 tablet, Rfl: 0 .  busPIRone (BUSPAR) 15 MG tablet, Take 1 tablet (15 mg total) by mouth 2 (two) times daily., Disp: 60 tablet, Rfl: 0 .  diltiazem (CARDIZEM CD) 180 MG 24 hr capsule, Take 1 capsule (180 mg total) by mouth daily., Disp: 30 capsule, Rfl: 0 .  furosemide (LASIX) 40 MG tablet, Take 40 mg by mouth daily., Disp: , Rfl:  .  gabapentin (NEURONTIN) 300 MG capsule, Take 2 capsules (600 mg total) by mouth 3 (three) times daily., Disp: 180 capsule, Rfl: 0 .  lisinopril (PRINIVIL,ZESTRIL) 10 MG tablet, Take 10 mg by mouth daily., Disp: , Rfl:  .  metFORMIN (GLUCOPHAGE) 500 MG tablet, Take 1 tablet (500 mg total) by mouth daily., Disp: 30 tablet, Rfl: 0 .  metoprolol succinate (TOPROL-XL) 50 MG 24 hr tablet, Take 1 tablet (50 mg total) by mouth daily. Take with or immediately following a meal., Disp: 30 tablet, Rfl: 0 .  multivitamin (ONE-A-DAY MEN'S) TABS tablet,  Take 1 tablet by mouth daily., Disp: 30 tablet, Rfl: 0 .  mycophenolate (CELLCEPT) 500 MG tablet, Take 1 tablet (500 mg total) by mouth 2 (two) times daily. Take 1 tablet (500MG ) by mouth every morning and 2 tablets (1000MG ) by mouth every evening (Patient taking differently: Take 500-1,000 mg by mouth See admin instructions. 500 mg every morning and 1000 mg at bedtime), Disp: 90 tablet, Rfl: 0 .  oxyCODONE (OXY IR/ROXICODONE) 5 MG immediate release tablet, Take 1-2 tablets  (5-10 mg total) by mouth every 4 (four) hours as needed for moderate pain or severe pain. (Patient taking differently: Take 10 mg by mouth daily as needed for moderate pain or severe pain. ), Disp: 30 tablet, Rfl: 0 .  senna-docusate (SENOKOT-S) 8.6-50 MG tablet, Take 1 tablet by mouth at bedtime as needed for mild constipation. (Patient taking differently: Take 1 tablet by mouth daily. ), Disp: 30 tablet, Rfl: 0 .  testosterone cypionate (DEPOTESTOSTERONE CYPIONATE) 200 MG/ML injection, Inject 200 mg into the muscle every 14 (fourteen) days., Disp: , Rfl:  .  thiamine 100 MG tablet, Take 1 tablet (100 mg total) by mouth daily., Disp: 30 tablet, Rfl: 0 .  folic acid (FOLVITE) 1 MG tablet, Take 1 tablet (1 mg total) by mouth daily. (Patient not taking: Reported on 04/15/2018), Disp: 30 tablet, Rfl: 0 .  nicotine (NICODERM CQ - DOSED IN MG/24 HOURS) 14 mg/24hr patch, Place 1 patch (14 mg total) onto the skin daily. (Patient not taking: Reported on 04/15/2018), Disp: 28 patch, Rfl: 0   Social History: Social History   Tobacco Use  . Smoking status: Current Every Day Smoker    Packs/day: 0.50    Types: Cigarettes  . Smokeless tobacco: Never Used  Substance Use Topics  . Alcohol use: Yes    Comment: Pt had tequila and rum, at least a 5th or a pint a day  . Drug use: No    Family Medical History: Family History  Problem Relation Age of Onset  . Rheum arthritis Father     Physical Examination: Vitals:   04/15/18 0930 04/15/18 1000  BP: 140/82 109/82  Pulse: 87 87  Resp: 16 16  Temp:    SpO2: 97% 95%     General: Patient is well developed, well nourished, calm, collected, and in no apparent distress. Tremors noted.   Psychiatric: Patient is non-anxious.  Head:  Pupils equal, round, and reactive to light.  Neck:   Supple.  Full range of motion.  Respiratory: Patient is breathing without any difficulty.  Extremities: No edema.  Vascular: Palpable pulses in dorsal pedal  vessels.  Skin:   On exposed skin, there are no abnormal skin lesions.  NEUROLOGICAL:  General: In no acute distress.  Tremors Awake, alert, oriented to person, place, and time.  Pupils equal round and reactive to light.  Facial tone is symmetric.  Tongue protrusion is midline (tremor).  There is no pronator drift.  Strength: Side Biceps Triceps Deltoid Interossei Grip Wrist Ext. Wrist Flex.  R 5 5 5 5 5 5 5   L 5 5 5 5 5 5 5    Side Iliopsoas Quads Hamstring PF DF EHL  R 5 5 5 5 5 5   L 5 5 5 5 5 5    Reflexes are 1+ and symmetric at the biceps, triceps, brachioradialis, patella and achilles.   Bilateral upper and lower extremity sensation is intact to light touch (exception distal to ankle bilaterally from neuropathy).  Clonus is not present.  Toes are down-going.  Gait not assessed.   Imaging: EXAM: CT HEAD WITHOUT CONTRAST 04/15/2018  TECHNIQUE: Contiguous axial images were obtained from the base of the skull through the vertex without intravenous contrast.  COMPARISON:  January 19, 2018  FINDINGS: Brain: Mild diffuse atrophy overall is stable. There is no intracranial mass, hemorrhage, extra-axial fluid collection, or midline shift. No acute infarct is evident. There is minimal small vessel disease in the centra semiovale bilaterally.  Vascular: There is no evident hyperdense vessel. No abnormal calcification evident.  Skull: The bony calvarium appears intact.  Sinuses/Orbits: There is mucosal thickening in several ethmoid air cells. Other visualized paranasal sinuses are clear. Visualized orbits appear symmetric bilaterally.  Other: Visualized mastoid air cells are clear.  IMPRESSION: Stable atrophy with minimal periventricular small vessel disease. No mass or hemorrhage. No acute infarct evident.  Mucosal thickening noted in several ethmoid air cells.  ADDENDUM REPORT: 04/15/2018 08:38  ADDENDUM: On further review, there is a subdural hematoma on  the right measuring 6 mm in maximum thickness. This hematoma involves portions of the temporal and parietal lobes on the right. It causes minimal mass effect but no midline shift. No other hemorrhage evident.  Critical Value/emergent results were called by telephone at the time of interpretation on 04/15/2018 at 8:36 am to Dr. Elita Quick , who verbally acknowledged these results.   Electronically Signed   By: Bretta Bang III M.D.   On: 04/15/2018 08:38  Assessment and Plan: Mr. Nin is a pleasant 57 y.o. male with 6mm right subdural hematoma involving the temporal and parietal lobes. No gross abnormalities noted on physical exam except tremors and neuropathy findings.  Dr. Adriana Simas was consulted and recommendations conveyed including:   -Repeat head CT 6 hours following initial imaging to determine stability.  - Keppra 1g BID for 7 days - In regard to DVT prophylaxis: If repeat head CT is stable, repeat again in 24 hours. If stability remains, may begin DVT prophylaxis.   Ivar Drape, PA-C Dept. of Neurosurgery

## 2018-04-15 NOTE — ED Notes (Signed)
ED TO INPATIENT HANDOFF REPORT  Name/Age/Gender Kurt Horton 57 y.o. male  Code Status Code Status History    Date Active Date Inactive Code Status Order ID Comments User Context   04/03/2018 0547 04/10/2018 2052 Full Code [id number]  Barbaraann Rondo, MD ED   02/04/2018 1528 02/07/2018 1728 Full Code [id number]  Mayo, Allyn Kenner, MD ED   02/03/2018 2233 02/04/2018 0841 Full Code [id number]  Jene Every, MD ED   01/20/2018 0140 01/22/2018 1819 Full Code [id number]  Barbaraann Rondo, MD ED   01/19/2018 1239 01/20/2018 0140 Full Code [id number]  Myrna Blazer, MD ED   12/22/2017 0841 12/25/2017 2031 Full Code [id number]  Barbaraann Rondo, MD Inpatient   10/27/2017 1522 11/02/2017 1432 Full Code [id number]  Milagros Loll, MD ED   10/21/2017 1945 10/23/2017 1833 Full Code [id number]  Auburn Bilberry, MD Inpatient   10/21/2017 1504 10/21/2017 1945 Full Code [id number]  Arnaldo Natal, MD ED   10/10/2017 1314 10/11/2017 1147 Full Code [id number]  Willy Eddy, MD ED   10/05/2017 2214 10/07/2017 2101 Full Code [id number]  Arnaldo Natal, MD ED   10/05/2017 1924 10/05/2017 2214 Full Code [id number]  Arnaldo Natal, MD ED   10/01/2017 0807 07/05/2017 1650 Full Code [id number]  Barbaraann Rondo, MD Inpatient   08/24/2017 1756 08/27/2017 1849 Full Code [id number]  Enid Baas, MD ED   08/14/2017 0030 08/18/2017 1605 Full Code [id number]  Oralia Manis, MD Inpatient   06/04/2017 0116 06/06/2017 1701 Full Code [id number]  Oralia Manis, MD Inpatient   03/19/2017 1559 03/20/2017 2128 Full Code [id number]  Arnaldo Natal, MD ED   02/25/2017 1421 03/01/2017 1829 Full Code [id number]  Houston Siren, MD Inpatient   11/12/2016 1533 11/14/2016 1425 Full Code [id number]  Marguarite Arbour, MD Inpatient   12/26/2015 1927 12/30/2015 1604 Full Code [id number]  Marguarite Arbour, MD Inpatient      Home/SNF/Other Home  Chief Complaint [facility] - Back pain  Level of Care/Admitting Diagnosis ED Disposition    ED  Disposition Condition Comment   Admit  Hospital Area: [facility] [[id number]]  Level of Care: Stepdown [14]  Diagnosis: Alcohol withdrawal (HCC) [291.81.ICD-9-CM]  Admitting Physician: Delfino Lovett [[id number]]  Attending Physician: Delfino Lovett [[id number]]  Estimated length of stay: past midnight tomorrow  Certification:: I certify this patient will need inpatient services for at least 2 midnights  PT Class (Do Not Modify): Inpatient [101]  PT Acc Code (Do Not Modify): Private [1]       Medical History Past Medical History:  Diagnosis Date  . A-fib (HCC)   . Alcohol abuse   . Alcohol withdrawal (HCC) 11/12/2016  . Diabetes mellitus without complication (HCC)   . DJD (degenerative joint disease)   . Hypertension   . Myasthenia gravis (HCC)   . Renal disorder     Allergies Allergies  Allergen Reactions  . Nsaids Other (See Comments)    Duodenal ulcers, GI bleeding  . Other Other (See Comments)    Patient states he can't take any "mycin" medications because myasthenia gravis.    IV Location/Drains/Wounds Patient Lines/Drains/Airways Status   Active Line/Drains/Airways    Name:   Placement date:   Placement time:   Site:   Days:   Peripheral IV 04/15/18 Left Forearm   04/15/18    0550    Forearm   less than 1          Labs/Imaging Results for orders placed or performed during the hospital  encounter of 04/15/18 (from the past 48 hour(s))  CBC with Differential     Status: Abnormal   Collection Time: 04/15/18  5:48 AM  Result Value Ref Range   WBC 9.7 4.0 - 10.5 K/uL   RBC 4.73 4.22 - 5.81 MIL/uL   Hemoglobin 13.0 13.0 - 17.0 g/dL   HCT 09.8 11.9 - 14.7 %   MCV 85.2 80.0 - 100.0 fL   MCH 27.5 26.0 - 34.0 pg   MCHC 32.3 30.0 - 36.0 g/dL   RDW 82.9 (H) 56.2 - 13.0 %   Platelets 474 (H) 150 - 400 K/uL   nRBC 0.0 0.0 - 0.2 %   Neutrophils Relative % 67 %   Neutro Abs 6.5 1.7 - 7.7 K/uL   Lymphocytes Relative 17 %   Lymphs Abs 1.7 0.7 - 4.0 K/uL    Monocytes Relative 13 %   Monocytes Absolute 1.3 (H) 0.1 - 1.0 K/uL   Eosinophils Relative 0 %   Eosinophils Absolute 0.0 0.0 - 0.5 K/uL   Basophils Relative 2 %   Basophils Absolute 0.2 (H) 0.0 - 0.1 K/uL   RBC Morphology See Note     Comment: ANISOCYTOSIS   Immature Granulocytes 1 %   Abs Immature Granulocytes 0.08 (H) 0.00 - 0.07 K/uL    Comment: Performed at [facility], [address] [place], [place] [address]  Comprehensive metabolic panel     Status: Abnormal   Collection Time: 04/15/18  5:48 AM  Result Value Ref Range   Sodium 143 135 - 145 mmol/L   Potassium 3.3 (L) 3.5 - 5.1 mmol/L   Chloride 102 98 - 111 mmol/L   CO2 27 22 - 32 mmol/L   Glucose, Bld 129 (H) 70 - 99 mg/dL   BUN 8 6 - 20 mg/dL   Creatinine, Ser 4.69 0.61 - 1.24 mg/dL   Calcium 8.6 (L) 8.9 - 10.3 mg/dL   Total Protein 6.5 6.5 - 8.1 g/dL   Albumin 3.5 3.5 - 5.0 g/dL   AST 37 15 - 41 U/L   ALT 32 0 - 44 U/L   Alkaline Phosphatase 86 38 - 126 U/L   Total Bilirubin 0.6 0.3 - 1.2 mg/dL   GFR calc non Af Amer >60 >60 mL/min   GFR calc Af Amer >60 >60 mL/min   Anion gap 14 5 - 15    Comment: Performed at [facility], [address] [place], [place] [address]  Ethanol     Status: Abnormal   Collection Time: 04/15/18  5:48 AM  Result Value Ref Range   Alcohol, Ethyl (B) 147 (H) <10 mg/dL    Comment: (NOTE) Lowest detectable limit for serum alcohol is 10 mg/dL. For medical purposes only. Performed at [facility], [address] [place], [place] [address]   Troponin I - Once     Status: None   Collection Time: 04/15/18  5:48 AM  Result Value Ref Range   Troponin I <0.03 <0.03 ng/mL    Comment: Performed at [facility], [address] [place], [place] [address]  CK     Status: Abnormal   Collection Time: 04/15/18  5:48 AM  Result Value Ref Range   Total CK 589 (H) 49 - 397 U/L    Comment: Performed at [facility], [address],  [place], [place] [address]  Protime-INR     Status: None   Collection Time: 04/15/18  5:48 AM  Result Value Ref Range  Prothrombin Time 12.9 11.4 - 15.2 seconds   INR 0.98     Comment: Performed at [facility], [address] [place], [place] [address]   Dg Lumbar Spine Complete  Result Date: 04/15/2018 CLINICAL DATA:  Chronic low back pain, possible fall. EXAM: LUMBAR SPINE - COMPLETE 4+ VIEW COMPARISON:  MRI of the lumbar spine dated June 22, 2017 FINDINGS: The lumbar vertebral bodies are preserved in height. There's gentle dextrocurvature centered in the mid lumbar spine. There is moderate to severe disc space narrowing at L2-3 and milder disc space narrowing at L1-2 and L3-4. There's mild disc space narrowing at L4-5 with grade 1 anterolisthesis of L4 with respect L5 which is stable. There's multilevel facet joint hypertrophy. There are anterior endplate osteophytes at L1-2 and L2-3. IMPRESSION: Multilevel degenerative disc disease with the greatest disc space height loss noted at L2-3. Persistent mild grade 1 anterolisthesis of L4 with respect L5. No acute fracture. Electronically Signed   By: David  [place] M.D.   On: 04/15/2018 06:53   Dg Pelvis 1-2 Views  Result Date: 04/15/2018 CLINICAL DATA:  Chronic low back pain, questionable fall. Pain in the lower abdominal and pelvic regions and proximal right femur. EXAM: PELVIS - 1-2 VIEW COMPARISON:  No recent studies in [facility] FINDINGS: The bony pelvis is subjectively adequately mineralized. 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 The acetabular and femoral components appear normal with no evidence of loosening. The native femur where visualized exhibits no acute abnormality. IMPRESSION: There is no acute or significant chronic bony abnormality of the pelvis. Electronically Signed   By: David  [place] M.D.   On: 04/15/2018 06:55   Ct Head Wo Contrast  Addendum Date: 04/15/2018   ADDENDUM REPORT: 04/15/2018 08:38 ADDENDUM: On further review,  there is a subdural hematoma on the right measuring 6 mm in maximum thickness. This hematoma involves portions of the temporal and parietal lobes on the right. It causes minimal mass effect but no midline shift. No other hemorrhage evident. Critical Value/emergent results were called by telephone at the time of interpretation on 04/15/2018 at 8:36 am to Dr. Elita Quick , who verbally acknowledged these results. Electronically Signed   By: Bretta Bang III M.D.   On: 04/15/2018 08:38   Result Date: 04/15/2018 CLINICAL DATA:  Tremors.  Question alcohol withdrawal.  Recent fall EXAM: CT HEAD WITHOUT CONTRAST TECHNIQUE: Contiguous axial images were obtained from the base of the skull through the vertex without intravenous contrast. COMPARISON:  January 19, 2018 FINDINGS: Brain: Mild diffuse atrophy overall is stable. There is no intracranial mass, hemorrhage, extra-axial fluid collection, or midline shift. No acute infarct is evident. There is minimal small vessel disease in the centra semiovale bilaterally. Vascular: There is no evident hyperdense vessel. No abnormal calcification evident. Skull: The bony calvarium appears intact. Sinuses/Orbits: There is mucosal thickening in several ethmoid air cells. Other visualized paranasal sinuses are clear. Visualized orbits appear symmetric bilaterally. Other: Visualized mastoid air cells are clear. IMPRESSION: Stable atrophy with minimal periventricular small vessel disease. No mass or hemorrhage. No acute infarct evident. Mucosal thickening noted in several ethmoid air cells. Electronically Signed: By: Bretta Bang III M.D. 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,   R     Signed and Held   Signed and Held  CBC  Tomorrow morning,   R  Signed and Held          Vitals/Pain Today's Vitals   04/15/18 0830 04/15/18 0900  04/15/18 0930 04/15/18 1000  BP:  (!) 150/85 140/82 109/82  Pulse:  89 87 87  Resp:  (!) 21 16 16   Temp:      TempSrc:      SpO2:  99% 97% 95%  Weight:      Height:      PainSc: 9  7       Isolation Precautions No active isolations  Medications Medications  LORazepam (ATIVAN) injection 0-4 mg (2 mg Intravenous Given 04/15/18 0600)  thiamine (B-1) injection 100 mg (100 mg Intravenous Given 04/15/18 0901)  levETIRAcetam (KEPPRA) tablet 1,000 mg (1,000 mg Oral Given 04/15/18 0900)  sodium chloride 0.9 % bolus 1,000 mL (0 mLs Intravenous Stopped 04/15/18 0953)  oxyCODONE-acetaminophen (PERCOCET/ROXICET) 5-325 MG per tablet 2 tablet (2 tablets Oral Given 04/15/18 0829)    Mobility 2 person assistance d/t tremors

## 2018-04-15 NOTE — ED Notes (Signed)
Patient transported to room 127 

## 2018-04-15 NOTE — Consult Note (Signed)
Reason for Consult: Subdural hematoma Referring Physician: Delfino Lovett CC: Alcohol intoxication and traumatic fall  HPI: Kurt Horton is an 57 y.o. male with past medical history atrial fibrillation with RVR, bulbar myasthenia gravis, alcoholic pancreatitis, obstructive sleep apnea, opioid dependence with withdrawal, multiple admissions for alcohol withdrawal, polysubstance abuse, hypertension, and diabetes mellitus with complications presenting to the ED on 04/15/2018 with alcohol intoxication and traumatic fall to the back of the head.   Patient states that he has been binge drinking and his last drink was last night. He recalled drinking about 15 bottles of alcohol and felt very unsteady prior to taking a fall and hitting the back of his head.  Patient states he does not recall the events prior to the fall.  Denies using illicit drugs in addition to alcohol.  States that he did not pass out or lost control of his bladder or bowel. Initial labs revealed normal kidney functions and liver function test, potassium 3.3,  ethanol 147 mg/dL, normal white count, UDS pending, CK 589 u/l.  CT head showed 6 mm right-sided subdural hematoma. Neurosurgeon was consulted at that time who stated the patient could be admitted locally with a repeat CT scan greater than 6 hours from the initial CT scan and if unchanged would be safe for discharge from the subdural hematoma aspect. However due to significant withdrawal symptoms currently, significant shaking and history of DTs in the past, patient was transferred to the ICU for further monitoring and management.  Past Medical History:  Diagnosis Date  . A-fib (HCC)   . Alcohol abuse   . Alcohol withdrawal (HCC) 11/12/2016  . Diabetes mellitus without complication (HCC)   . DJD (degenerative joint disease)   . Hypertension   . Myasthenia gravis (HCC)   . Renal disorder     Past Surgical History:  Procedure Laterality Date  . AMPUTATION TOE Left 12/29/2015    Procedure: AMPUTATION TOE;  Surgeon: Gwyneth Revels, DPM;  Location: [facility] ORS;  Service: Podiatry;  Laterality: Left;  . ESOPHAGOGASTRODUODENOSCOPY N/A 02/05/2018   Procedure: ESOPHAGOGASTRODUODENOSCOPY (EGD);  Surgeon: Pasty Spillers, MD;  Location: [facility] ENDOSCOPY;  Service: Endoscopy;  Laterality: N/A;  . PERIPHERAL VASCULAR CATHETERIZATION N/A 12/27/2015   Procedure: Lower Extremity Angiography;  Surgeon: Annice Needy, MD;  Location: [facility] INVASIVE CV LAB;  Service: Cardiovascular;  Laterality: N/A;  . right hip surgery    . right shoulder surgery      Family History  Problem Relation Age of Onset  . Rheum arthritis Father     Social History:  reports that he has been smoking cigarettes. He has been smoking about 0.50 packs per day. He has never used smokeless tobacco. He reports that he drinks alcohol. He reports that he does not use drugs.  Allergies  Allergen Reactions  . Nsaids Other (See Comments)    Duodenal ulcers, GI bleeding  . Other Other (See Comments)    Patient states he can't take any "mycin" medications because myasthenia gravis.    Medications:  I have reviewed the patient's current medications. Prior to Admission:  Medications Prior to Admission  Medication Sig Dispense Refill Last Dose  . amiodarone (PACERONE) 200 MG tablet Take 1 tablet (200 mg total) by mouth daily. 30 tablet 0 04/15/2018 at 0700  . aspirin EC 325 MG tablet Take 325 mg by mouth daily.   04/15/2018 at Unknown time  . B Complex-C (B-COMPLEX WITH VITAMIN C) tablet Take 1 tablet by mouth daily. 30 tablet 0  04/14/2018 at 0700  . busPIRone (BUSPAR) 15 MG tablet Take 1 tablet (15 mg total) by mouth 2 (two) times daily. 60 tablet 0 04/14/2018 at 0700  . diltiazem (CARDIZEM CD) 180 MG 24 hr capsule Take 1 capsule (180 mg total) by mouth daily. 30 capsule 0 04/14/2018 at 0700  . furosemide (LASIX) 40 MG tablet Take 40 mg by mouth daily.   Past Week at Unknown time  . gabapentin (NEURONTIN) 300 MG capsule  Take 2 capsules (600 mg total) by mouth 3 (three) times daily. 180 capsule 0 04/14/2018 at Unknown time  . lisinopril (PRINIVIL,ZESTRIL) 10 MG tablet Take 10 mg by mouth daily.   04/14/2018 at Unknown time  . metFORMIN (GLUCOPHAGE) 500 MG tablet Take 1 tablet (500 mg total) by mouth daily. 30 tablet 0 Past Month at Unknown time  . metoprolol succinate (TOPROL-XL) 50 MG 24 hr tablet Take 1 tablet (50 mg total) by mouth daily. Take with or immediately following a meal. 30 tablet 0 04/14/2018 at Unknown time  . multivitamin (ONE-A-DAY MEN'S) TABS tablet Take 1 tablet by mouth daily. 30 tablet 0 Past Week at Unknown time  . mycophenolate (CELLCEPT) 500 MG tablet Take 1 tablet (500 mg total) by mouth 2 (two) times daily. Take 1 tablet (500MG ) by mouth every morning and 2 tablets (1000MG ) by mouth every evening (Patient taking differently: Take 500-1,000 mg by mouth See admin instructions. 500 mg every morning and 1000 mg at bedtime) 90 tablet 0 04/14/2018 at Unknown time  . oxyCODONE (OXY IR/ROXICODONE) 5 MG immediate release tablet Take 1-2 tablets (5-10 mg total) by mouth every 4 (four) hours as needed for moderate pain or severe pain. (Patient taking differently: Take 10 mg by mouth daily as needed for moderate pain or severe pain. ) 30 tablet 0 prn at prn  . senna-docusate (SENOKOT-S) 8.6-50 MG tablet Take 1 tablet by mouth at bedtime as needed for mild constipation. (Patient taking differently: Take 1 tablet by mouth daily. ) 30 tablet 0 prn at prn  . testosterone cypionate (DEPOTESTOSTERONE CYPIONATE) 200 MG/ML injection Inject 200 mg into the muscle every 14 (fourteen) days.   Past Month at Unknown time  . thiamine 100 MG tablet Take 1 tablet (100 mg total) by mouth daily. 30 tablet 0 Past Week at Unknown time  . folic acid (FOLVITE) 1 MG tablet Take 1 tablet (1 mg total) by mouth daily. (Patient not taking: Reported on 04/15/2018) 30 tablet 0 Not Taking at Unknown time  . nicotine (NICODERM CQ - DOSED IN  MG/24 HOURS) 14 mg/24hr patch Place 1 patch (14 mg total) onto the skin daily. (Patient not taking: Reported on 04/15/2018) 28 patch 0 Not Taking at Unknown time   Scheduled: . [START ON 04/16/2018] amiodarone  200 mg Oral Daily  . B-complex with vitamin C  1 tablet Oral Daily  . busPIRone  15 mg Oral BID  . diltiazem  180 mg Oral Daily  . docusate sodium  100 mg Oral BID  . folic acid  1 mg Oral Daily  . gabapentin  600 mg Oral TID  . levETIRAcetam  1,000 mg Oral BID  . LORazepam  0-4 mg Oral Q6H   Followed by  . [START ON 04/17/2018] LORazepam  0-4 mg Oral Q12H  . metFORMIN  500 mg Oral Q breakfast  . metoprolol succinate  50 mg Oral Daily  . multivitamin with minerals  1 tablet Oral Daily  . mycophenolate  500 mg Oral q morning -  10a   And  . mycophenolate  1,000 mg Oral QHS  . nicotine  14 mg Transdermal Daily  . thiamine  100 mg Oral Daily   Or  . thiamine  100 mg Intravenous Daily    ROS: History obtained from the patient   General ROS: negative for - chills, fatigue, fever, night sweats, weight gain or weight loss Psychological ROS: negative for - behavioral disorder, hallucinations, memory difficulties, mood swings or suicidal ideation Ophthalmic ROS: negative for - blurry vision, double vision, eye pain or loss of vision ENT ROS: negative for - epistaxis, nasal discharge, oral lesions, sore throat, tinnitus or vertigo Allergy and Immunology ROS: negative for - hives or itchy/watery eyes Hematological and Lymphatic ROS: negative for - bleeding problems, bruising or swollen lymph nodes Endocrine ROS: negative for - galactorrhea, hair pattern changes, polydipsia/polyuria or temperature intolerance Respiratory ROS: negative for - cough, hemoptysis, shortness of breath or wheezing Cardiovascular ROS: negative for - chest pain, dyspnea on exertion, edema or irregular heartbeat Gastrointestinal ROS: negative for - abdominal pain, diarrhea, hematemesis, nausea/vomiting or stool  incontinence Genito-Urinary ROS: negative for - dysuria, hematuria, incontinence or urinary frequency/urgency Musculoskeletal ROS: negative for - joint swelling or muscular weakness Neurological ROS: as noted in HPI Dermatological ROS: negative for rash and skin lesion changes  Physical Examination: Blood pressure 140/82, pulse 87, temperature 98.4 F (36.9 C), temperature source Oral, resp. rate 16, height 5\' 11"  (1.803 m), weight 86.6 kg, SpO2 97 %.  HEENT-  Normocephalic, no lesions, without obvious abnormality.  Normal external eye and conjunctiva.  Normal TM's bilaterally.  Normal auditory canals and external ears. Normal external nose, mucus membranes and septum.  Normal pharynx. Cardiovascular- S1, S2 normal, pulses palpable throughout   Lungs- chest clear, no wheezing, rales, normal symmetric air entry Abdomen- soft, non-tender; bowel sounds normal; no masses,  no organomegaly Extremities- no edema Lymph-no adenopathy palpable Musculoskeletal-no joint tenderness, deformity or swelling Skin-warm and dry, no hyperpigmentation, vitiligo, or suspicious lesions  Neurological Exam   Mental Status: Alert, oriented, thought content appropriate.  Speech fluent without evidence of aphasia.  Able to follow 3 step commands without difficulty. Attention span and concentration seemed appropriate  Cranial Nerves: II: Discs flat bilaterally; Visual fields grossly normal, pupils equal, round, reactive to light and accommodation III,IV, VI: ptosis not present, extra-ocular motions intact bilaterally V,VII: smile symmetric, facial light touch sensation intact VIII: hearing normal bilaterally IX,X: gag reflex present XI: bilateral shoulder shrug XII: midline tongue extension Motor: Right :  Upper extremity   5/5 Without pronator drift      Left: Upper extremity   5/5 without pronator drift Right:   Lower extremity   5/5                                          Left: Lower extremity    5/5 Moderate fine tremors in bilateral upper extremities Tone and bulk:normal tone throughout; no atrophy noted Sensory: Pinprick and light touch intact bilaterally Deep Tendon Reflexes: 2+ and symmetric throughout Plantars: Right: mute                              Left: mute Cerebellar: Finger-to-nose testing intact bilaterally. Heel to shin testing normal bilaterally Gait: not tested due to safety concerns  Data Reviewed  Laboratory Studies:   Basic Metabolic  Panel: Recent Labs  Lab 04/15/18 0548  NA 143  K 3.3*  CL 102  CO2 27  GLUCOSE 129*  BUN 8  CREATININE 0.80  CALCIUM 8.6*    Liver Function Tests: Recent Labs  Lab 04/15/18 0548  AST 37  ALT 32  ALKPHOS 86  BILITOT 0.6  PROT 6.5  ALBUMIN 3.5   No results for input(s): LIPASE, AMYLASE in the last 168 hours. No results for input(s): AMMONIA in the last 168 hours.  CBC: Recent Labs  Lab 04/15/18 0548  WBC 9.7  NEUTROABS 6.5  HGB 13.0  HCT 40.3  MCV 85.2  PLT 474*    Cardiac Enzymes: Recent Labs  Lab 04/15/18 0548  CKTOTAL 589*  TROPONINI <0.03    BNP: Invalid input(s): POCBNP  CBG: Recent Labs  Lab 04/09/18 1130 04/09/18 1712 04/09/18 2159 04/10/18 0754 04/10/18 1143  GLUCAP 106* 118* 142* 127* 134*    Microbiology: Results for orders placed or performed during the hospital encounter of 01/19/18  MRSA PCR Screening     Status: None   Collection Time: 01/20/18  3:39 AM  Result Value Ref Range Status   MRSA by PCR NEGATIVE NEGATIVE Final    Comment:        The GeneXpert MRSA Assay (FDA approved for NASAL specimens only), is one component of a comprehensive MRSA colonization surveillance program. It is not intended to diagnose MRSA infection nor to guide or monitor treatment for MRSA infections. Performed at [facility], [address] [place], [place] [address]     Coagulation Studies: Recent Labs    04/15/18 0548  LABPROT 12.9  INR 0.98     Urinalysis: No results for input(s): COLORURINE, LABSPEC, PHURINE, GLUCOSEU, HGBUR, BILIRUBINUR, KETONESUR, PROTEINUR, UROBILINOGEN, NITRITE, LEUKOCYTESUR in the last 168 hours.  Invalid input(s): APPERANCEUR  Lipid Panel:     Component Value Date/Time   TRIG 112 01/20/2018 0827    HgbA1C:  Lab Results  Component Value Date   HGBA1C 5.9 (H) 10/27/2017    Urine Drug Screen:      Component Value Date/Time   LABOPIA NONE DETECTED 04/01/2018 0041   COCAINSCRNUR NONE DETECTED 04/01/2018 0041   LABBENZ POSITIVE (A) 04/01/2018 0041   AMPHETMU NONE DETECTED 04/01/2018 0041   THCU NONE DETECTED 04/01/2018 0041   LABBARB NONE DETECTED 04/01/2018 0041    Alcohol Level:  Recent Labs  Lab 04/15/18 0548  ETH 147*    Other results: EKG: normal EKG, normal sinus rhythm, unchanged from previous tracings. Vent. rate 83 BPM PR interval * ms QRS duration 85 ms QT/QTc 393/462 ms P-R-T axes 65 -47 69  Imaging: Dg Lumbar Spine Complete  Result Date: 04/15/2018 CLINICAL DATA:  Chronic low back pain, possible fall. EXAM: LUMBAR SPINE - COMPLETE 4+ VIEW COMPARISON:  MRI of the lumbar spine dated June 22, 2017 FINDINGS: The lumbar vertebral bodies are preserved in height. There's gentle dextrocurvature centered in the mid lumbar spine. There is moderate to severe disc space narrowing at L2-3 and milder disc space narrowing at L1-2 and L3-4. There's mild disc space narrowing at L4-5 with grade 1 anterolisthesis of L4 with respect L5 which is stable. There's multilevel facet joint hypertrophy. There are anterior endplate osteophytes at L1-2 and L2-3. IMPRESSION: Multilevel degenerative disc disease with the greatest disc space height loss noted at L2-3. Persistent mild grade 1 anterolisthesis of L4 with respect L5. No acute fracture. Electronically Signed   By: David  [place] M.D.   On: 04/15/2018  06:53   Dg Pelvis 1-2 Views  Result Date: 04/15/2018 CLINICAL DATA:  Chronic low back pain,  questionable fall. Pain in the lower abdominal and pelvic regions and proximal right femur. EXAM: PELVIS - 1-2 VIEW COMPARISON:  No recent studies in [facility] FINDINGS: The bony pelvis is subjectively adequately mineralized. 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 The acetabular and femoral components appear normal with no evidence of loosening. The native femur where visualized exhibits no acute abnormality. IMPRESSION: There is no acute or significant chronic bony abnormality of the pelvis. Electronically Signed   By: David  [place] M.D.   On: 04/15/2018 06:55   Ct Head Wo Contrast  Addendum Date: 04/15/2018   ADDENDUM REPORT: 04/15/2018 08:38 ADDENDUM: On further review, there is a subdural hematoma on the right measuring 6 mm in maximum thickness. This hematoma involves portions of the temporal and parietal lobes on the right. It causes minimal mass effect but no midline shift. No other hemorrhage evident. Critical Value/emergent results were called by telephone at the time of interpretation on 04/15/2018 at 8:36 am to Dr. Elita Quick , who verbally acknowledged these results. Electronically Signed   By: Bretta Bang III M.D.   On: 04/15/2018 08:38   Result Date: 04/15/2018 CLINICAL DATA:  Tremors.  Question alcohol withdrawal.  Recent fall EXAM: CT HEAD WITHOUT CONTRAST TECHNIQUE: Contiguous axial images were obtained from the base of the skull through the vertex without intravenous contrast. COMPARISON:  January 19, 2018 FINDINGS: Brain: Mild diffuse atrophy overall is stable. There is no intracranial mass, hemorrhage, extra-axial fluid collection, or midline shift. No acute infarct is evident. There is minimal small vessel disease in the centra semiovale bilaterally. Vascular: There is no evident hyperdense vessel. No abnormal calcification evident. Skull: The bony calvarium appears intact. Sinuses/Orbits: There is mucosal thickening in several ethmoid air cells. Other visualized paranasal sinuses  are clear. Visualized orbits appear symmetric bilaterally. Other: Visualized mastoid air cells are clear. IMPRESSION: Stable atrophy with minimal periventricular small vessel disease. No mass or hemorrhage. No acute infarct evident. Mucosal thickening noted in several ethmoid air cells. Electronically Signed: By: Bretta Bang III M.D. On: 04/15/2018 07:07   Patient seen and examined.  Clinical course and management discussed.  Necessary edits performed.  I agree with the above.  Assessment and plan of care developed and discussed below.    Assessment: 57 y.o male  with past medical history atrial fibrillation with RVR, bulbar myasthenia gravis, alcoholic pancreatitis, obstructive sleep apnea, opioid dependence with withdrawal, multiple admissions for alcohol withdrawal, polysubstance abuse, hypertension, and diabetes mellitus with complications presenting to the ED on 04/15/2018 with alcohol intoxication and traumatic fall to the back of the head. CT head reviewed and showed showed 6 mm right-sided subdural hematoma. Neurosurgeon was consulted at that time who stated the patient could be admitted locally with a repeat CT scan greater than 6 hours from the initial CT scan and if unchanged would be safe for discharge from the subdural hematoma aspect.  Recommendation 1.  Agree with neurosurgery recommendations and disposition. 2.  CIWA protocol 3.  No evidence of seizure.  Would not continue maintenance Keppra 4.  EEG 5.  Would d/c ASA  This patient was staffed with Dr. Verlon Au, Thad Ranger who personally evaluated patient, reviewed documentation and agreed with assessment and plan of care as above.  Webb Silversmith, DNP, FNP-BC Board certified Nurse Practitioner Neurology Department  04/15/2018, 10:00 AM   Thana Farr, MD Neurology [phone]  04/15/2018  1:36 PM

## 2018-04-16 ENCOUNTER — Inpatient Hospital Stay: Payer: Medicaid Other

## 2018-04-16 LAB — CBC
HCT: 34.4 % — ABNORMAL LOW (ref 39.0–52.0)
Hemoglobin: 10.5 g/dL — ABNORMAL LOW (ref 13.0–17.0)
MCH: 27.3 pg (ref 26.0–34.0)
MCHC: 30.5 g/dL (ref 30.0–36.0)
MCV: 89.4 fL (ref 80.0–100.0)
NRBC: 0 % (ref 0.0–0.2)
Platelets: 390 10*3/uL (ref 150–400)
RBC: 3.85 MIL/uL — ABNORMAL LOW (ref 4.22–5.81)
RDW: 22.5 % — ABNORMAL HIGH (ref 11.5–15.5)
WBC: 6.5 10*3/uL (ref 4.0–10.5)

## 2018-04-16 LAB — BASIC METABOLIC PANEL
ANION GAP: 10 (ref 5–15)
BUN: 12 mg/dL (ref 6–20)
CO2: 25 mmol/L (ref 22–32)
Calcium: 8.2 mg/dL — ABNORMAL LOW (ref 8.9–10.3)
Chloride: 103 mmol/L (ref 98–111)
Creatinine, Ser: 0.76 mg/dL (ref 0.61–1.24)
GFR calc Af Amer: >60 mL/min (ref >60)
GFR calc non Af Amer: >60 mL/min (ref >60)
Glucose, Bld: 177 mg/dL — ABNORMAL HIGH (ref 70–99)
Potassium: 3.5 mmol/L (ref 3.5–5.1)
Sodium: 138 mmol/L (ref 135–145)

## 2018-04-16 LAB — GLUCOSE, CAPILLARY
GLUCOSE-CAPILLARY: 87 mg/dL (ref 70–99)
Glucose-Capillary: 160 mg/dL — ABNORMAL HIGH (ref 70–99)
Glucose-Capillary: 100 mg/dL — ABNORMAL HIGH (ref 70–99)
Glucose-Capillary: 104 mg/dL — ABNORMAL HIGH (ref 70–99)

## 2018-04-16 MED ORDER — FENTANYL CITRATE (PF) 100 MCG/2ML IJ SOLN
12.5000 ug | Freq: Four times a day (QID) | INTRAMUSCULAR | Status: DC | PRN
  Administered 2018-04-16 – 2018-04-17 (×4): 12.5 ug via INTRAVENOUS
  Filled 2018-04-16 (×4): qty 2

## 2018-04-16 NOTE — Progress Notes (Signed)
eeg completed ° °

## 2018-04-16 NOTE — Progress Notes (Signed)
Patient alert, no complaints of shortness of breath. Patient has chronic back pain. Fentanyl added for patient comfort/pain. Tolerating diet with no complications. Using urinal with no complications. Patient repeat CT scan performed and EEG. Precedex titrated off early afternoon. Orders placed for transfer to floor.

## 2018-04-16 NOTE — Progress Notes (Signed)
Notified Debera LatJeremiah K regarding CT results in. No additional orders given,

## 2018-04-16 NOTE — Clinical Social Work Note (Signed)
Clinical Social Work Assessment  Patient Details  Name: Kurt Horton MRN: [id number] Date of Birth: 09-Dec-1960  Date of referral:  04/16/18               Reason for consult:  Substance Use/ETOH Abuse                Permission sought to share information with:    Permission granted to share information::     Name::        Agency::     Relationship::     Contact Information:     Housing/Transportation Living arrangements for the past 2 months:  Single Family Home Source of Information:  Patient Patient Interpreter Needed:  None Criminal Activity/Legal Involvement Pertinent to Current Situation/Hospitalization:  No - Comment as needed Significant Relationships:  Parents Lives with:  Parents Do you feel safe going back to the place where you live?  Yes Need for family participation in patient care:  No (Coment)  Care giving concerns:  Patient resides with his parents.   Social Worker assessment / plan:  CSW spoke with patient regarding alcohol rehab. CSW explained role and purpose of visit. Patient states he has been in many rehab facilities. He states that he remembers the "good ones" and not so much the "bad ones." He went on to say that he lives with his parents and drinks about 20 airplane bottles of rum a day and an occasional beer. Patient states he wants to stop drinking because he may die if he doesn't. CSW explained that CSW could look into ADACT or RTS treatment centers for him. He said I am not sure I want to go to those. CSW explained that currently he does not have insurance and when he went to the other facilities, he had insurance. CSW further explained that his choices are limited due to not having insurance. Patient wishes to not pursue ADACT or RTS at this time. CSW updated nursing.   Employment status:  Disabled (Comment on whether or not currently receiving Disability) Insurance information:  Self Pay (Medicaid Pending) PT Recommendations:    Information / Referral  to community resources:     Patient/Family's Response to care:  Patient expressed appreciation for CSW visit.   Patient/Family's Understanding of and Emotional Response to Diagnosis, Current Treatment, and Prognosis:  Patient does not seem vested in his rehab and has gone so many times that it seems automatic that this is what he should ask for but is not vested in it.  Emotional Assessment Appearance:  Appears stated age Attitude/Demeanor/Rapport:  Complaining, Inconsistent Affect (typically observed):    Orientation:  Oriented to Self, Oriented to Place, Oriented to  Time, Oriented to Situation Alcohol / Substance use:  Not Applicable Psych involvement (Current and /or in the community):  No (Comment)  Discharge Needs  Concerns to be addressed:  Care Coordination Readmission within the last 30 days:  Yes Current discharge risk:  None Barriers to Discharge:  No Barriers Identified   [name], LCSW 04/16/2018, 12:14 PM

## 2018-04-16 NOTE — Progress Notes (Signed)
1        [employer] - [place] at [facility]   PATIENT NAME: Kurt Horton    MR#:  [id number]  DATE OF BIRTH:  11-29-1960  SUBJECTIVE:  CHIEF COMPLAINT:   Chief Complaint  Patient presents with  . Alcohol Intoxication  . Withdrawal  . Back Pain    REVIEW OF SYSTEMS:  ROS  DRUG ALLERGIES:   Allergies  Allergen Reactions  . Nsaids Other (See Comments)    Duodenal ulcers, GI bleeding  . Other Other (See Comments)    Patient states he can't take any "mycin" medications because myasthenia gravis.   VITALS:  Blood pressure 109/70, pulse 76, temperature 98.4 F (36.9 C), resp. rate 12, height 5\' 11"  (1.803 m), weight 87.6 kg, SpO2 100 %. PHYSICAL EXAMINATION:  Physical Exam LABORATORY PANEL:  Male CBC Recent Labs  Lab 04/16/18 0549  WBC 6.5  HGB 10.5*  HCT 34.4*  PLT 390   ------------------------------------------------------------------------------------------------------------------ Chemistries  Recent Labs  Lab 04/15/18 0548 04/16/18 0549  NA 143 138  K 3.3* 3.5  CL 102 103  CO2 27 25  GLUCOSE 129* 177*  BUN 8 12  CREATININE 0.80 0.76  CALCIUM 8.6* 8.2*  AST 37  --   ALT 32  --   ALKPHOS 86  --   BILITOT 0.6  --    RADIOLOGY:  Ct Head Wo Contrast  Result Date: 04/16/2018 CLINICAL DATA:  57 year old male with subdural hematoma. EXAM: CT HEAD WITHOUT CONTRAST TECHNIQUE: Contiguous axial images were obtained from the base of the skull through the vertex without intravenous contrast. COMPARISON:  04/15/2018. FINDINGS: Brain: Broad-based right-sided convexity subdural hematoma with maximal thickness of 6.1 mm similar in overall size to prior examination. Lysis of red cell suspected in the interim. Mass-effect upon the right lateral ventricle without midline shift. No new intracranial hemorrhage noted. No CT evidence of large acute infarct. Atrophy. No intracranial mass lesion noted on this unenhanced exam. Vascular: No acute hyperdense  vessel. Skull: No skull fracture noted. Sinuses/Orbits: No acute orbital abnormality. Visualized sinuses are clear. Other: Mastoid air cells and middle ear cavities are clear. IMPRESSION: Broad-based right convexity subdural hematoma with maximal thickness of 6.1 mm of similar size to prior exam with local mass effect upon the right lateral ventricle but without midline shift. Electronically Signed   By: Lacy Duverney M.D.   On: 04/16/2018 13:54   ASSESSMENT AND PLAN:  57 y m admitted for alcohol withdrawal and subdural hematoma  * Alcohol withdrawal - CIWA protocol, improving  * Subdural hematoma - CT head shows 6 mm right-sided subdural hematoma.  Repeat CT head shows stable hematoma -Appreciate neurology and neurosurgery recommendation -EEG unremarkable  * Hypokalemia Replete and recheck  * Chronic atrial fibrillation Rate controlled. Continue amiodarone and Cardizem - hold any asa compounds, also anticoagulants  * Essential hypertension - controlled on home meds   * chronic low back pain  - pain meds as need     All the records are reviewed and case discussed with Care Management/Social Worker. Management plans discussed with the patient, family and they are in agreement.  CODE STATUS: Full Code  TOTAL TIME TAKING CARE OF THIS PATIENT: 15 minutes.   More than 50% of the time was spent in counseling/coordination of care: YES  POSSIBLE D/C IN 2-3 DAYS, DEPENDING ON CLINICAL CONDITION.   Delfino Lovett M.D on 04/16/2018 at 5:39 PM  Between 7am to 6pm - Pager - [phone]  After 6pm  go to [email or web address] - Social research officer, governmentpassword [email or web address]  [facility] [place] [facility]  Office  [phone]  CC: Primary care physician; Barbette ReichmannHande, Vishwanath, MD  Note: This dictation was prepared with Dragon dictation along with smaller phrase technology. Any transcriptional errors that result from this process are unintentional.

## 2018-04-16 NOTE — Procedures (Addendum)
ELECTROENCEPHALOGRAM REPORT   Patient: Kurt PhenesJohn D Horton       Room #: IC12A EEG No. ID: [id number] Age: 57 y.o.        Sex: male Referring Physician: Sherryll BurgerShah Report Date:  04/16/2018        Interpreting Physician: Thana FarrEYNOLDS, Venetta Knee  History: Kurt PhenesJohn D Patron is an 57 y.o. male with altered mental status  Medications:  Pacerone, Precedex, Buspar, Cardizem, Folvite, Neurontin, Insulin, Toprol, MVI, Cellcept, Thiamine  Conditions of Recording:  This is a 21 channel routine scalp EEG performed with bipolar and monopolar montages arranged in accordance to the international 10/20 system of electrode placement. One channel was dedicated to EKG recording.  The patient is in the awake and drowsy states.  Description:  The waking background activity consists of a low voltage, symmetrical, fairly well organized, 7 Hz alpha activity, seen from the parieto-occipital and posterior temporal regions.  Low voltage fast activity, poorly organized, is seen anteriorly and is at times superimposed on more posterior regions.  A mixture of theta and alpha rhythms are seen from the central and temporal regions. The patient drowses with slowing to irregular, low voltage theta and beta activity.   Stage II sleep is not obtained. No epileptiform activity is noted.   Hyperventilation was not performed.  Intermittent photic stimulation was performed but failed to illicit any change in the tracing.    IMPRESSION: This is an abnormal EEG secondary to posterior background slowing.  This finding may be seen with a diffuse gray matter disturbance that is etiologically nonspecific, but may include a dementia, among other possibilities.  No epileptiform activity is noted.     Thana FarrLeslie Adelita Hone, MD Neurology [phone] 04/16/2018, 1:50 PM

## 2018-04-16 NOTE — Progress Notes (Signed)
Report given to Lauren. Did let RN know that patient refused getting setup for bed bath as he was too tiered to give himself a bath. He preferred to wait. Patient being transferred to room 225.

## 2018-04-16 NOTE — Progress Notes (Addendum)
Subjective: Patient awake, alert and oriented x4 . He remains on Precedex.  He reports that Precedex helps him better than Ativan for withdrawal.  He would also like to fentanyl for pain rather than oxycodone.  Denies any issues or concerns this morning.  Objective: Current vital signs: BP 100/68   Pulse 79   Temp 98.7 F (37.1 C)   Resp 20   Ht 5\' 11"  (1.803 m)   Wt 87.6 kg   SpO2 97%   BMI 26.94 kg/m  Vital signs in last 24 hours: Temp:  [98.7 F (37.1 C)-98.8 F (37.1 C)] 98.7 F (37.1 C) (12/10 0800) Pulse Rate:  [65-85] 79 (12/10 1000) Resp:  [9-26] 20 (12/10 1000) BP: (90-129)/(54-82) 100/68 (12/10 1000) SpO2:  [91 %-98 %] 97 % (12/10 1000) Weight:  [87 kg-87.6 kg] 87.6 kg (12/10 0500)  Intake/Output from previous day: 12/09 0701 - 12/10 0700 In: 2348.5 [I.V.:1348.5; IV Piggyback:1000] Out: 400 [Urine:400] Intake/Output this shift: Total I/O In: -  Out: 350 [Urine:350] Nutritional status:  Diet Order            Diet Heart Room service appropriate? Yes; Fluid consistency: Thin  Diet effective now             Neurological Exam  Mental Status: Alert, oriented, thought content appropriate. Speech fluent without evidence of aphasia. Able to follow 3 step commands without difficulty. Attention span and concentration seemed appropriate  Cranial Nerves: II: Discs flat bilaterally; Visual fields grossly normal, pupils equal, round, reactive to light and accommodation III,IV, VI: ptosis not present, extra-ocular motions intact bilaterally V,VII: smile symmetric, facial light touch sensationintact VIII: hearing normal bilaterally IX,X: gag reflex present XI: bilateral shoulder shrug XII: midline tongue extension Motor: Right :Upper extremity 5/5Without pronator driftLeft: Upper extremity 5/5 without pronator drift Right:Lower extremity 5/5Left: Lower extremity 5/5 Mild fine tremors in bilateral  upper extremities Tone and bulk:normal tone throughout; no atrophy noted Sensory: Pinprick and light touchintact bilaterally Deep Tendon Reflexes: 2+ and symmetric throughout Plantars: Right:muteLeft: mute Cerebellar: Finger-to-nosetesting intact bilaterally.Heel to shin testing normal bilaterally Gait: not tested due to safety concerns  Data Reviewed  Lab Results: Basic Metabolic Panel: Recent Labs  Lab 04/15/18 0548 04/16/18 0549  NA 143 138  K 3.3* 3.5  CL 102 103  CO2 27 25  GLUCOSE 129* 177*  BUN 8 12  CREATININE 0.80 0.76  CALCIUM 8.6* 8.2*    Liver Function Tests: Recent Labs  Lab 04/15/18 0548  AST 37  ALT 32  ALKPHOS 86  BILITOT 0.6  PROT 6.5  ALBUMIN 3.5   No results for input(s): LIPASE, AMYLASE in the last 168 hours. No results for input(s): AMMONIA in the last 168 hours.  CBC: Recent Labs  Lab 04/15/18 0548 04/16/18 0549  WBC 9.7 6.5  NEUTROABS 6.5  --   HGB 13.0 10.5*  HCT 40.3 34.4*  MCV 85.2 89.4  PLT 474* 390    Cardiac Enzymes: Recent Labs  Lab 04/15/18 0548  CKTOTAL 589*  TROPONINI <0.03    Lipid Panel: No results for input(s): CHOL, TRIG, HDL, CHOLHDL, VLDL, LDLCALC in the last 168 hours.  CBG: Recent Labs  Lab 04/10/18 1143 04/15/18 1151 04/15/18 1624 04/15/18 2315 04/16/18 0728  GLUCAP 134* 114* 132* 84 160*    Microbiology: Results for orders placed or performed during the hospital encounter of 04/15/18  MRSA PCR Screening     Status: None   Collection Time: 04/15/18 11:53 AM  Result Value Ref Range Status  MRSA by PCR NEGATIVE NEGATIVE Final    Comment:        The GeneXpert MRSA Assay (FDA approved for NASAL specimens only), is one component of a comprehensive MRSA colonization surveillance program. It is not intended to diagnose MRSA infection nor to guide or monitor treatment for MRSA infections. Performed at [facility], [address]  [place], [place] [address]     Coagulation Studies: Recent Labs    04/15/18 0548  LABPROT 12.9  INR 0.98    Imaging: Dg Lumbar Spine Complete  Result Date: 04/15/2018 CLINICAL DATA:  Chronic low back pain, possible fall. EXAM: LUMBAR SPINE - COMPLETE 4+ VIEW COMPARISON:  MRI of the lumbar spine dated June 22, 2017 FINDINGS: The lumbar vertebral bodies are preserved in height. There's gentle dextrocurvature centered in the mid lumbar spine. There is moderate to severe disc space narrowing at L2-3 and milder disc space narrowing at L1-2 and L3-4. There's mild disc space narrowing at L4-5 with grade 1 anterolisthesis of L4 with respect L5 which is stable. There's multilevel facet joint hypertrophy. There are anterior endplate osteophytes at L1-2 and L2-3. IMPRESSION: Multilevel degenerative disc disease with the greatest disc space height loss noted at L2-3. Persistent mild grade 1 anterolisthesis of L4 with respect L5. No acute fracture. Electronically Signed   By: David  [place] M.D.   On: 04/15/2018 06:53   Dg Pelvis 1-2 Views  Result Date: 04/15/2018 CLINICAL DATA:  Chronic low back pain, questionable fall. Pain in the lower abdominal and pelvic regions and proximal right femur. EXAM: PELVIS - 1-2 VIEW COMPARISON:  No recent studies in [facility] FINDINGS: The bony pelvis is subjectively adequately mineralized. 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 The acetabular and femoral components appear normal with no evidence of loosening. The native femur where visualized exhibits no acute abnormality. IMPRESSION: There is no acute or significant chronic bony abnormality of the pelvis. Electronically Signed   By: David  [place] M.D.   On: 04/15/2018 06:55   Ct Head Wo Contrast  Result Date: 04/15/2018 CLINICAL DATA:  Follow-up subdural hemorrhage EXAM: CT HEAD WITHOUT CONTRAST TECHNIQUE: Contiguous axial images were obtained from the base of the skull through the vertex without intravenous contrast. COMPARISON:   Earlier today FINDINGS: Brain: Unchanged extent of subdural hematoma along the majority of the right cerebral convexity, primarily high-density and measuring up to 6 mm in thickness. Local mass effect without midline shift or entrapment. No infarct or parenchymal hemorrhage. Vascular: Negative Skull: Negative for fracture Sinuses/Orbits: No evidence of injury IMPRESSION: Unchanged subdural hematoma along the right cerebral convexity measuring up to 6 mm in thickness. Electronically Signed   By: Marnee Spring M.D.   On: 04/15/2018 15:20   Ct Head Wo Contrast  Addendum Date: 04/15/2018   ADDENDUM REPORT: 04/15/2018 08:38 ADDENDUM: On further review, there is a subdural hematoma on the right measuring 6 mm in maximum thickness. This hematoma involves portions of the temporal and parietal lobes on the right. It causes minimal mass effect but no midline shift. No other hemorrhage evident. Critical Value/emergent results were called by telephone at the time of interpretation on 04/15/2018 at 8:36 am to Dr. Elita Quick , who verbally acknowledged these results. Electronically Signed   By: Bretta Bang III M.D.   On: 04/15/2018 08:38   Result Date: 04/15/2018 CLINICAL DATA:  Tremors.  Question alcohol withdrawal.  Recent fall EXAM: CT HEAD WITHOUT CONTRAST TECHNIQUE: Contiguous axial images were obtained from the base of the skull  through the vertex without intravenous contrast. COMPARISON:  January 19, 2018 FINDINGS: Brain: Mild diffuse atrophy overall is stable. There is no intracranial mass, hemorrhage, extra-axial fluid collection, or midline shift. No acute infarct is evident. There is minimal small vessel disease in the centra semiovale bilaterally. Vascular: There is no evident hyperdense vessel. No abnormal calcification evident. Skull: The bony calvarium appears intact. Sinuses/Orbits: There is mucosal thickening in several ethmoid air cells. Other visualized paranasal sinuses are clear.  Visualized orbits appear symmetric bilaterally. Other: Visualized mastoid air cells are clear. IMPRESSION: Stable atrophy with minimal periventricular small vessel disease. No mass or hemorrhage. No acute infarct evident. Mucosal thickening noted in several ethmoid air cells. Electronically Signed: By: Bretta BangWilliam  Woodruff III M.D. On: 04/15/2018 07:07    Medications:  I have reviewed the patient's current medications. Prior to Admission:  Medications Prior to Admission  Medication Sig Dispense Refill Last Dose  . amiodarone (PACERONE) 200 MG tablet Take 1 tablet (200 mg total) by mouth daily. 30 tablet 0 04/15/2018 at 0700  . aspirin EC 325 MG tablet Take 325 mg by mouth daily.   04/15/2018 at Unknown time  . B Complex-C (B-COMPLEX WITH VITAMIN C) tablet Take 1 tablet by mouth daily. 30 tablet 0 04/14/2018 at 0700  . busPIRone (BUSPAR) 15 MG tablet Take 1 tablet (15 mg total) by mouth 2 (two) times daily. 60 tablet 0 04/14/2018 at 0700  . diltiazem (CARDIZEM CD) 180 MG 24 hr capsule Take 1 capsule (180 mg total) by mouth daily. 30 capsule 0 04/14/2018 at 0700  . furosemide (LASIX) 40 MG tablet Take 40 mg by mouth daily.   Past Week at Unknown time  . gabapentin (NEURONTIN) 300 MG capsule Take 2 capsules (600 mg total) by mouth 3 (three) times daily. 180 capsule 0 04/14/2018 at Unknown time  . lisinopril (PRINIVIL,ZESTRIL) 10 MG tablet Take 10 mg by mouth daily.   04/14/2018 at Unknown time  . metFORMIN (GLUCOPHAGE) 500 MG tablet Take 1 tablet (500 mg total) by mouth daily. 30 tablet 0 Past Month at Unknown time  . metoprolol succinate (TOPROL-XL) 50 MG 24 hr tablet Take 1 tablet (50 mg total) by mouth daily. Take with or immediately following a meal. 30 tablet 0 04/14/2018 at Unknown time  . multivitamin (ONE-A-DAY MEN'S) TABS tablet Take 1 tablet by mouth daily. 30 tablet 0 Past Week at Unknown time  . mycophenolate (CELLCEPT) 500 MG tablet Take 1 tablet (500 mg total) by mouth 2 (two) times daily. Take 1  tablet (500MG ) by mouth every morning and 2 tablets (1000MG ) by mouth every evening (Patient taking differently: Take 500-1,000 mg by mouth See admin instructions. 500 mg every morning and 1000 mg at bedtime) 90 tablet 0 04/14/2018 at Unknown time  . oxyCODONE (OXY IR/ROXICODONE) 5 MG immediate release tablet Take 1-2 tablets (5-10 mg total) by mouth every 4 (four) hours as needed for moderate pain or severe pain. (Patient taking differently: Take 10 mg by mouth daily as needed for moderate pain or severe pain. ) 30 tablet 0 prn at prn  . senna-docusate (SENOKOT-S) 8.6-50 MG tablet Take 1 tablet by mouth at bedtime as needed for mild constipation. (Patient taking differently: Take 1 tablet by mouth daily. ) 30 tablet 0 prn at prn  . testosterone cypionate (DEPOTESTOSTERONE CYPIONATE) 200 MG/ML injection Inject 200 mg into the muscle every 14 (fourteen) days.   Past Month at Unknown time  . thiamine 100 MG tablet Take 1 tablet (100 mg  total) by mouth daily. 30 tablet 0 Past Week at Unknown time  . folic acid (FOLVITE) 1 MG tablet Take 1 tablet (1 mg total) by mouth daily. (Patient not taking: Reported on 04/15/2018) 30 tablet 0 Not Taking at Unknown time  . nicotine (NICODERM CQ - DOSED IN MG/24 HOURS) 14 mg/24hr patch Place 1 patch (14 mg total) onto the skin daily. (Patient not taking: Reported on 04/15/2018) 28 patch 0 Not Taking at Unknown time   Scheduled: . amiodarone  200 mg Oral Daily  . B-complex with vitamin C  1 tablet Oral Daily  . busPIRone  15 mg Oral BID  . diltiazem  180 mg Oral Daily  . docusate sodium  100 mg Oral BID  . folic acid  1 mg Oral Daily  . gabapentin  600 mg Oral TID  . insulin aspart  0-5 Units Subcutaneous QHS  . insulin aspart  0-9 Units Subcutaneous TID WC  . LORazepam  0-4 mg Oral Q6H   Followed by  . [START ON 04/17/2018] LORazepam  0-4 mg Oral Q12H  . metoprolol succinate  50 mg Oral Daily  . multivitamin with minerals  1 tablet Oral Daily  . mycophenolate  500  mg Oral q morning - 10a   And  . mycophenolate  1,000 mg Oral QHS  . nicotine  14 mg Transdermal Daily  . thiamine  100 mg Oral Daily   Or  . thiamine  100 mg Intravenous Daily   Patient seen and examined.  Clinical course and management discussed.  Necessary edits performed.  I agree with the above.  Assessment and plan of care developed and discussed below.  Assessment: 57 y.o male  with past medical history atrial fibrillation with RVR, bulbar myasthenia gravis, alcoholic pancreatitis, obstructive sleep apnea, opioid dependence with withdrawal, multiple admissions for alcoholwithdrawal,polysubstance abuse, hypertension,anddiabetes mellitus with complications presentingwith alcohol intoxication and traumatic fall. Found to have 6 mm right-sided subdural hematoma. Not a candidate for surgical intervention per Neurosurgery. Repeat CT head showed unchanged subdural hematoma along the right cerebral convexity measuring up to 6 mm in thickness. EEG only significant for mild posterior background slowing.    Recommendation 1.  Agree with neurosurgery recommendations and disposition. 2.  CIWA protocol 3.  EEG unremarkable.  Would not continue anticonvulsant therapy.   4.  Polysubstance abuse and alcohol cessation counseling  This patient was staffed with Dr. Verlon Au, Thad Ranger who personally evaluated patient, reviewed documentation and agreed with assessment and plan of care as above.  Webb Silversmith, DNP, FNP-BC Board certified Nurse Practitioner Neurology Department   LOS: 1 day   04/16/2018  11:05 AM  Thana Farr, MD Neurology [phone]  04/16/2018  2:27 PM

## 2018-04-16 NOTE — Progress Notes (Signed)
Patient alert, no complaints of shortness of breath. Patient has chronic back pain. Fentanyl added for patient comfort/pain. Tolerating diet with no complications. Using urinal with no complications.  VS stable  Transfer to gen med floor

## 2018-04-17 LAB — GLUCOSE, CAPILLARY
Glucose-Capillary: 100 mg/dL — ABNORMAL HIGH (ref 70–99)
Glucose-Capillary: 164 mg/dL — ABNORMAL HIGH (ref 70–99)
Glucose-Capillary: 128 mg/dL — ABNORMAL HIGH (ref 70–99)
Glucose-Capillary: 152 mg/dL — ABNORMAL HIGH (ref 70–99)

## 2018-04-17 MED ORDER — FENTANYL CITRATE (PF) 100 MCG/2ML IJ SOLN
12.5000 ug | Freq: Four times a day (QID) | INTRAMUSCULAR | Status: DC | PRN
  Administered 2018-04-17 – 2018-04-19 (×8): 12.5 ug via INTRAVENOUS
  Filled 2018-04-17 (×8): qty 2

## 2018-04-17 MED ORDER — OXYCODONE-ACETAMINOPHEN 5-325 MG PO TABS: 1.0000 | ORAL_TABLET | ORAL | Status: DC | PRN

## 2018-04-17 NOTE — Progress Notes (Addendum)
1        [employer] - [place] at [facility]   PATIENT NAME: Kurt SimmerJohn Horton    MR#:  [id number]  DATE OF BIRTH:  December 28, 1960  SUBJECTIVE:  CHIEF COMPLAINT:   Chief Complaint  Patient presents with  . Alcohol Intoxication  . Withdrawal  . Back Pain  requesting pain meds, lethargic at times per nursing REVIEW OF SYSTEMS:  Review of Systems  Constitutional: Negative for chills, fever and weight loss.  HENT: Negative for nosebleeds and sore throat.   Eyes: Negative for blurred vision.  Respiratory: Negative for cough, shortness of breath and wheezing.   Cardiovascular: Negative for chest pain, orthopnea, leg swelling and PND.  Gastrointestinal: Negative for abdominal pain, constipation, diarrhea, heartburn, nausea and vomiting.  Genitourinary: Negative for dysuria and urgency.  Musculoskeletal: Negative for back pain.  Skin: Negative for rash.  Neurological: Negative for dizziness, speech change, focal weakness and headaches.  Endo/Heme/Allergies: Does not bruise/bleed easily.  Psychiatric/Behavioral: Negative for depression.   DRUG ALLERGIES:   Allergies  Allergen Reactions  . Nsaids Other (See Comments)    Duodenal ulcers, GI bleeding  . Other Other (See Comments)    Patient states he can't take any "mycin" medications because myasthenia gravis.   VITALS:  Blood pressure 132/80, pulse 79, temperature 98.4 F (36.9 C), temperature source Oral, resp. rate 14, height 5\' 11"  (1.803 m), weight 87.6 kg, SpO2 99 %. PHYSICAL EXAMINATION:  Physical Exam  Constitutional: He is oriented to person, place, and time.  HENT:  Head: Normocephalic and atraumatic.  Eyes: Pupils are equal, round, and reactive to light. Conjunctivae and EOM are normal.  Neck: Normal range of motion. Neck supple. No tracheal deviation present. No thyromegaly present.  Cardiovascular: Normal rate, regular rhythm and normal heart sounds.  Pulmonary/Chest: Effort normal and breath sounds  normal. No respiratory distress. He has no wheezes. He exhibits no tenderness.  Abdominal: Soft. Bowel sounds are normal. He exhibits no distension. There is no tenderness.  Musculoskeletal: Normal range of motion.  Neurological: He is alert and oriented to person, place, and time. He displays tremor. No cranial nerve deficit.  Skin: Skin is warm and dry. No rash noted.   LABORATORY PANEL:  Male CBC Recent Labs  Lab 04/16/18 0549  WBC 6.5  HGB 10.5*  HCT 34.4*  PLT 390   ------------------------------------------------------------------------------------------------------------------ Chemistries  Recent Labs  Lab 04/15/18 0548 04/16/18 0549  NA 143 138  K 3.3* 3.5  CL 102 103  CO2 27 25  GLUCOSE 129* 177*  BUN 8 12  CREATININE 0.80 0.76  CALCIUM 8.6* 8.2*  AST 37  --   ALT 32  --   ALKPHOS 86  --   BILITOT 0.6  --    RADIOLOGY:  No results found. ASSESSMENT AND PLAN:  [age] y m admitted for alcohol withdrawal and subdural hematoma  * Alcohol withdrawal - CIWA protocol, improving  * Subdural hematoma - CT head shows 6 mm right-sided subdural hematoma.  Repeat CT head shows stable hematoma -Appreciate neurology and neurosurgery recommendation -EEG unremarkable  * Hypokalemia Repleted  * Chronic atrial fibrillation Rate controlled. Continue amiodarone and Cardizem - hold any asa compounds, also anticoagulants considering SDH  * Essential hypertension - controlled on home meds   * chronic low back pain  - pain meds as need, requests fentanyl     All the records are reviewed and case discussed with Care Management/Social Worker. Management plans discussed with the patient, nursing and  they are in agreement.  CODE STATUS: Full Code  TOTAL TIME TAKING CARE OF THIS PATIENT: 15 minutes.   More than 50% of the time was spent in counseling/coordination of care: YES  POSSIBLE D/C IN 1-2 DAYS, DEPENDING ON CLINICAL CONDITION.   Delfino Lovett M.D on  04/17/2018 at 2:54 PM  Between 7am to 6pm - Pager - [phone]  After 6pm go to [email or web address] - Social research officer, government  [facility] [place] [facility]  Office  [phone]  CC: Primary care physician; [name], MD  Note: This dictation was prepared with Dragon dictation along with smaller phrase technology. Any transcriptional errors that result from this process are unintentional.

## 2018-04-17 NOTE — Progress Notes (Signed)
Pt has been constantly requesting to have his fentanyl dose increased and to have it q4 PRN instead of q6 PRN. Pt was told that his dose was re ordered for Q6 PRN after it was D/C'd this A.M. Pt then was asking for his Ativan, which is Q12. Pt then was asking if he could have Valium. Pt is complaining saying, "He is miserable" but doesn't show any signs of distress.

## 2018-04-18 LAB — CBC
HCT: 39.7 % (ref 39.0–52.0)
Hemoglobin: 12 g/dL — ABNORMAL LOW (ref 13.0–17.0)
MCH: 26.9 pg (ref 26.0–34.0)
MCHC: 30.2 g/dL (ref 30.0–36.0)
MCV: 89 fL (ref 80.0–100.0)
Platelets: 396 10*3/uL (ref 150–400)
RBC: 4.46 MIL/uL (ref 4.22–5.81)
RDW: 22.5 % — ABNORMAL HIGH (ref 11.5–15.5)
WBC: 8.9 10*3/uL (ref 4.0–10.5)
nRBC: 0 % (ref 0.0–0.2)

## 2018-04-18 LAB — BASIC METABOLIC PANEL
Anion gap: 10 (ref 5–15)
BUN: 8 mg/dL (ref 6–20)
CALCIUM: 8.8 mg/dL — AB (ref 8.9–10.3)
CO2: 25 mmol/L (ref 22–32)
Chloride: 103 mmol/L (ref 98–111)
Creatinine, Ser: 0.67 mg/dL (ref 0.61–1.24)
GFR calc non Af Amer: >60 mL/min (ref >60)
Glucose, Bld: 121 mg/dL — ABNORMAL HIGH (ref 70–99)
Potassium: 4.1 mmol/L (ref 3.5–5.1)
Sodium: 138 mmol/L (ref 135–145)

## 2018-04-18 LAB — GLUCOSE, CAPILLARY
Glucose-Capillary: 102 mg/dL — ABNORMAL HIGH (ref 70–99)
Glucose-Capillary: 135 mg/dL — ABNORMAL HIGH (ref 70–99)
Glucose-Capillary: 166 mg/dL — ABNORMAL HIGH (ref 70–99)
Glucose-Capillary: 141 mg/dL — ABNORMAL HIGH (ref 70–99)

## 2018-04-18 MED ORDER — OXYCODONE-ACETAMINOPHEN 5-325 MG PO TABS
1.0000 | ORAL_TABLET | ORAL | Status: DC | PRN
  Administered 2018-04-18 – 2018-04-19 (×4): 1 via ORAL
  Filled 2018-04-18 (×4): qty 1

## 2018-04-18 MED ORDER — LORAZEPAM 1 MG PO TABS
1.0000 mg | ORAL_TABLET | Freq: Four times a day (QID) | ORAL | Status: DC | PRN
  Administered 2018-04-18: 1 mg via ORAL

## 2018-04-18 MED ORDER — LORAZEPAM 2 MG/ML IJ SOLN
1.0000 mg | Freq: Four times a day (QID) | INTRAMUSCULAR | Status: DC | PRN
  Administered 2018-04-18: 1 mg via INTRAVENOUS
  Filled 2018-04-18: qty 1

## 2018-04-18 MED ORDER — OXYCODONE-ACETAMINOPHEN 5-325 MG PO TABS
1.0000 | ORAL_TABLET | Freq: Four times a day (QID) | ORAL | Status: DC | PRN
  Administered 2018-04-18: 2 via ORAL
  Filled 2018-04-18: qty 2

## 2018-04-18 MED ORDER — OXYCODONE-ACETAMINOPHEN 5-325 MG PO TABS
1.0000 | ORAL_TABLET | Freq: Four times a day (QID) | ORAL | Status: DC | PRN
  Administered 2018-04-18: 1 via ORAL
  Filled 2018-04-18: qty 1

## 2018-04-18 NOTE — Progress Notes (Signed)
1        [facility] [place] at [facility]   PATIENT NAME: Kurt Horton    MR#:  [id number]  DATE OF BIRTH:  [birth year]  SUBJECTIVE:  CHIEF COMPLAINT:   Chief Complaint  Patient presents with  . Alcohol Intoxication  . Withdrawal  . Back Pain   The patient complains of back pain and leg pain.  He wants stronger pain medication. REVIEW OF SYSTEMS:  Review of Systems  Constitutional: Negative for chills, fever and weight loss.  HENT: Negative for nosebleeds and sore throat.   Eyes: Negative for blurred vision.  Respiratory: Negative for cough, shortness of breath and wheezing.   Cardiovascular: Negative for chest pain, orthopnea, leg swelling and PND.  Gastrointestinal: Negative for abdominal pain, constipation, diarrhea, heartburn, nausea and vomiting.  Genitourinary: Negative for dysuria and urgency.  Musculoskeletal: Positive for back pain.       Back pain and leg pain  Skin: Negative for rash.  Neurological: Negative for dizziness, speech change, focal weakness and headaches.  Endo/Heme/Allergies: Does not bruise/bleed easily.  Psychiatric/Behavioral: Negative for depression. The patient is nervous/anxious.    DRUG ALLERGIES:   Allergies  Allergen Reactions  . Nsaids Other (See Comments)    Duodenal ulcers, GI bleeding  . Other Other (See Comments)    Patient states he can't take any "mycin" medications because myasthenia gravis.   VITALS:  Blood pressure 125/80, pulse 73, temperature 99 F (37.2 C), temperature source Oral, resp. rate 16, height 5\' 11"  (1.803 m), weight 87.6 kg, SpO2 99 %. PHYSICAL EXAMINATION:  Physical Exam HENT:     Head: Normocephalic and atraumatic.  Eyes:     Extraocular Movements: EOM normal.     Conjunctiva/sclera: Conjunctivae normal.     Pupils: Pupils are equal, round, and reactive to light.  Neck:     Musculoskeletal: Normal range of motion and neck supple.     Thyroid: No thyromegaly.     Trachea: No tracheal  deviation.  Cardiovascular:     Rate and Rhythm: Normal rate and regular rhythm.     Heart sounds: Normal heart sounds.  Pulmonary:     Effort: Pulmonary effort is normal. No respiratory distress.     Breath sounds: Normal breath sounds. No wheezing.  Chest:     Chest wall: No tenderness.  Abdominal:     General: Bowel sounds are normal. There is no distension.     Palpations: Abdomen is soft.     Tenderness: There is no abdominal tenderness.  Musculoskeletal: Normal range of motion.  Skin:    General: Skin is warm and dry.     Findings: No rash.  Neurological:     Mental Status: He is alert and oriented to person, place, and time.     Cranial Nerves: No cranial nerve deficit.     Motor: Tremor present.    LABORATORY PANEL:  Male CBC Recent Labs  Lab 04/18/18 0530  WBC 8.9  HGB 12.0*  HCT 39.7  PLT 396   ------------------------------------------------------------------------------------------------------------------ Chemistries  Recent Labs  Lab 04/15/18 0548  04/18/18 0530  NA 143   < > 138  K 3.3*   < > 4.1  CL 102   < > 103  CO2 27   < > 25  GLUCOSE 129*   < > 121*  BUN 8   < > 8  CREATININE 0.80   < > 0.67  CALCIUM 8.6*   < > 8.8*  AST  37  --   --   ALT 32  --   --   ALKPHOS 86  --   --   BILITOT 0.6  --   --    < > = values in this interval not displayed.   RADIOLOGY:  No results found. ASSESSMENT AND PLAN:  [age] y m admitted for alcohol withdrawal and subdural hematoma  * Alcohol withdrawal - CIWA protocol, improving  * Subdural hematoma - CT head shows 6 mm right-sided subdural hematoma.  Repeat CT head shows stable hematoma -Appreciate neurology and neurosurgery recommendation -EEG unremarkable  * Hypokalemia.  Improved with potassium supplement.  * Chronic atrial fibrillation Rate controlled. Continue amiodarone, Lopressor and Cardizem - hold any asa compounds, also anticoagulants considering SDH  * Essential hypertension -  controlled on home meds   * chronic low back pain  - pain meds as need.  The patient continuously requesting stronger pain medication.  Diabetes.  Continue sliding scale.  Tobacco abuse.  Smoking cessation was counseled for 3 to 4 minutes, nicotine patch.  All the records are reviewed and case discussed with Care Management/Social Worker. Management plans discussed with the patient, nursing and they are in agreement.  CODE STATUS: Full Code  TOTAL TIME TAKING CARE OF THIS PATIENT: 25 minutes.   More than 50% of the time was spent in counseling/coordination of care: YES  POSSIBLE D/C IN 1-2 DAYS, DEPENDING ON CLINICAL CONDITION.   Shaune PollackQing Alsace Dowd M.D on 04/18/2018 at 4:39 PM  Between 7am to 6pm - Pager - [phone]  After 6pm go to [email or web address] - Social research officer, governmentpassword EPAS [email or web address]  [facility] [place] [facility]  Office  [phone]  CC: Primary care physician; Barbette ReichmannHande, Vishwanath, MD  Note: This dictation was prepared with Dragon dictation along with smaller phrase technology. Any transcriptional errors that result from this process are unintentional.

## 2018-04-18 NOTE — Progress Notes (Signed)
Pt is complaining of chronic back pain and is very upset with his pain medication regimen, saying "ya'll are not managing my pain well at all." Prime doctor was notified and orders placed by MD. Pt does not look in distress and looks like he is resting comfortably in his bed.   Ariellah Faust [place]

## 2018-04-19 LAB — GLUCOSE, CAPILLARY
Glucose-Capillary: 142 mg/dL — ABNORMAL HIGH (ref 70–99)
Glucose-Capillary: 103 mg/dL — ABNORMAL HIGH (ref 70–99)

## 2018-04-19 MED ORDER — NICOTINE 14 MG/24HR TD PT24: 14.0000 mg | MEDICATED_PATCH | Freq: Every day | TRANSDERMAL | 0 refills | Status: DC

## 2018-04-19 MED ORDER — THIAMINE HCL 100 MG PO TABS: 100.0000 mg | ORAL_TABLET | Freq: Every day | ORAL | 1 refills | Status: DC

## 2018-04-19 MED ORDER — OXYCODONE-ACETAMINOPHEN 5-325 MG PO TABS
1.0000 | ORAL_TABLET | Freq: Four times a day (QID) | ORAL | Status: DC | PRN
  Administered 2018-04-19: 1 via ORAL
  Filled 2018-04-19: qty 1

## 2018-04-19 MED ORDER — FOLIC ACID 1 MG PO TABS: 1.0000 mg | ORAL_TABLET | Freq: Every day | ORAL | 1 refills | Status: DC

## 2018-04-19 MED ORDER — ONE-A-DAY MENS PO TABS: 1.0000 | ORAL_TABLET | Freq: Every day | ORAL | 1 refills | Status: DC

## 2018-04-19 NOTE — Discharge Summary (Addendum)
[facility] - [place] at [facility]   PATIENT NAME: Kurt Horton    MR#:  [id number]  DATE OF BIRTH:  [birth year]  DATE OF ADMISSION:  04/15/2018   ADMITTING PHYSICIAN: Delfino LovettVipul Shah, MD  DATE OF DISCHARGE: 04/19/2018  PRIMARY CARE PHYSICIAN: Barbette ReichmannHande, Vishwanath, MD   ADMISSION DIAGNOSIS:  ETOH abuse [F10.10] SDH (subdural hematoma) (HCC) [S06.5X9A] Acute alcoholic intoxication with complication (HCC) [F10.929] Acute midline low back pain without sciatica [M54.5] Alcohol withdrawal (HCC) [F10.239] DISCHARGE DIAGNOSIS:  Active Problems:   Alcohol withdrawal (HCC)  SECONDARY DIAGNOSIS:   Past Medical History:  Diagnosis Date  . A-fib (HCC)   . Alcohol abuse   . Alcohol withdrawal (HCC) 11/12/2016  . Diabetes mellitus without complication (HCC)   . DJD (degenerative joint disease)   . Hypertension   . Myasthenia gravis (HCC)   . Renal disorder    HOSPITAL COURSE:  57 y m admitted for alcohol withdrawal and subdural hematoma  * Alcohol withdrawal - CIWA protocol, improved.  * Subdural hematoma - CT head shows6 mm right-sided subdural hematoma.  Repeat CT head shows stable hematoma -Appreciate neurology and neurosurgery recommendation -EEG unremarkable  *Hypokalemia.  Improved with potassium supplement.  *Chronic atrial fibrillation Rate controlled. Continue amiodarone, Lopressor and Cardizem - hold any asa compounds, also anticoagulants considering SDH  *Essential hypertension - controlled onhome meds   *chronic low back pain - pain meds as need.  The patient continuously requesting stronger pain medication. Advised f/u pain clinic.  Diabetes.  Continue sliding scale.  Tobacco abuse.  Smoking cessation was counseled for 3 to 4 minutes, nicotine patch. DISCHARGE CONDITIONS:  Stable, discharge to home today. I called patient's mobile phone after discharge, left message (don't take aspirin). CONSULTS OBTAINED:  Treatment Team:    Kym Groomriadhosp, Neuro1, MD DRUG ALLERGIES:   Allergies  Allergen Reactions  . Nsaids Other (See Comments)    Duodenal ulcers, GI bleeding  . Other Other (See Comments)    Patient states he can't take any "mycin" medications because myasthenia gravis.   DISCHARGE MEDICATIONS:   Allergies as of 04/19/2018      Reactions   Nsaids Other (See Comments)   Duodenal ulcers, GI bleeding   Other Other (See Comments)   Patient states he can't take any "mycin" medications because myasthenia gravis.      Medication List    STOP taking these medications   aspirin EC 325 MG tablet     TAKE these medications   amiodarone 200 MG tablet Commonly known as:  PACERONE Take 1 tablet (200 mg total) by mouth daily.   B-complex with vitamin C tablet Take 1 tablet by mouth daily.   busPIRone 15 MG tablet Commonly known as:  BUSPAR Take 1 tablet (15 mg total) by mouth 2 (two) times daily.   diltiazem 180 MG 24 hr capsule Commonly known as:  CARDIZEM CD Take 1 capsule (180 mg total) by mouth daily.   folic acid 1 MG tablet Commonly known as:  FOLVITE Take 1 tablet (1 mg total) by mouth daily.   furosemide 40 MG tablet Commonly known as:  LASIX Take 40 mg by mouth daily.   gabapentin 300 MG capsule Commonly known as:  NEURONTIN Take 2 capsules (600 mg total) by mouth 3 (three) times daily.   lisinopril 10 MG tablet Commonly known as:  PRINIVIL,ZESTRIL Take 10 mg by mouth daily.   metFORMIN 500 MG tablet Commonly known as:  GLUCOPHAGE Take 1 tablet (500 mg total) by  mouth daily.   metoprolol succinate 50 MG 24 hr tablet Commonly known as:  TOPROL-XL Take 1 tablet (50 mg total) by mouth daily. Take with or immediately following a meal.   multivitamin Tabs tablet Take 1 tablet by mouth daily.   mycophenolate 500 MG tablet Commonly known as:  CELLCEPT Take 1 tablet (500 mg total) by mouth 2 (two) times daily. Take 1 tablet (500MG ) by mouth every morning and 2 tablets (1000MG ) by  mouth every evening What changed:    how much to take  when to take this  additional instructions   nicotine 14 mg/24hr patch Commonly known as:  NICODERM CQ - dosed in mg/24 hours Place 1 patch (14 mg total) onto the skin daily.   oxyCODONE 5 MG immediate release tablet Commonly known as:  Oxy IR/ROXICODONE Take 1-2 tablets (5-10 mg total) by mouth every 4 (four) hours as needed for moderate pain or severe pain. What changed:    how much to take  when to take this   senna-docusate 8.6-50 MG tablet Commonly known as:  Senokot-S Take 1 tablet by mouth at bedtime as needed for mild constipation. What changed:  when to take this   testosterone cypionate 200 MG/ML injection Commonly known as:  DEPOTESTOSTERONE CYPIONATE Inject 200 mg into the muscle every 14 (fourteen) days.   thiamine 100 MG tablet Take 1 tablet (100 mg total) by mouth daily.        DISCHARGE INSTRUCTIONS:  See AVS. If you experience worsening of your admission symptoms, develop shortness of breath, life threatening emergency, suicidal or homicidal thoughts you must seek medical attention immediately by calling 911 or calling your MD immediately  if symptoms less severe.  You Must read complete instructions/literature along with all the possible adverse reactions/side effects for all the Medicines you take and that have been prescribed to you. Take any new Medicines after you have completely understood and accpet all the possible adverse reactions/side effects.   Please note  You were cared for by a hospitalist during your hospital stay. If you have any questions about your discharge medications or the care you received while you were in the hospital after you are discharged, you can call the unit and asked to speak with the hospitalist on call if the hospitalist that took care of you is not available. Once you are discharged, your primary care physician will handle any further medical issues. Please note  that NO REFILLS for any discharge medications will be authorized once you are discharged, as it is imperative that you return to your primary care physician (or establish a relationship with a primary care physician if you do not have one) for your aftercare needs so that they can reassess your need for medications and monitor your lab values.    On the day of Discharge:  VITAL SIGNS:  Blood pressure 123/79, pulse 72, temperature 97.9 F (36.6 C), temperature source Oral, resp. rate 16, height 5\' 11"  (1.803 m), weight 87.6 kg, SpO2 99 %. PHYSICAL EXAMINATION:  GENERAL:  57 y.o.-year-old patient lying in the bed with no acute distress.  EYES: Pupils equal, round, reactive to light and accommodation. No scleral icterus. Extraocular muscles intact.  HEENT: Head atraumatic, normocephalic. Oropharynx and nasopharynx clear.  NECK:  Supple, no jugular venous distention. No thyroid enlargement, no tenderness.  LUNGS: Normal breath sounds bilaterally, no wheezing, rales,rhonchi or crepitation. No use of accessory muscles of respiration.  CARDIOVASCULAR: S1, S2 normal. No murmurs, rubs, or gallops.  ABDOMEN: Soft, non-tender, non-distended. Bowel sounds present. No organomegaly or mass.  EXTREMITIES: No pedal edema, cyanosis, or clubbing.  NEUROLOGIC: Cranial nerves II through XII are intact. Muscle strength 5/5 in all extremities. Sensation intact. Gait not checked.  PSYCHIATRIC: The patient is alert and oriented x 3.  SKIN: No obvious rash, lesion, or ulcer.  DATA REVIEW:   CBC Recent Labs  Lab 04/18/18 0530  WBC 8.9  HGB 12.0*  HCT 39.7  PLT 396    Chemistries  Recent Labs  Lab 04/15/18 0548  04/18/18 0530  NA 143   < > 138  K 3.3*   < > 4.1  CL 102   < > 103  CO2 27   < > 25  GLUCOSE 129*   < > 121*  BUN 8   < > 8  CREATININE 0.80   < > 0.67  CALCIUM 8.6*   < > 8.8*  AST 37  --   --   ALT 32  --   --   ALKPHOS 86  --   --   BILITOT 0.6  --   --    < > = values in this  interval not displayed.     Microbiology Results  Results for orders placed or performed during the hospital encounter of 04/15/18  MRSA PCR Screening     Status: None   Collection Time: 04/15/18 11:53 AM  Result Value Ref Range Status   MRSA by PCR NEGATIVE NEGATIVE Final    Comment:        The GeneXpert MRSA Assay (FDA approved for NASAL specimens only), is one component of a comprehensive MRSA colonization surveillance program. It is not intended to diagnose MRSA infection nor to guide or monitor treatment for MRSA infections. Performed at [facility], [address], [place], [place] [address]     RADIOLOGY:  No results found.   Management plans discussed with the patient, family and they are in agreement.  CODE STATUS: Full Code   TOTAL TIME TAKING CARE OF THIS PATIENT: 33 minutes.    Shaune Pollack M.D on 04/19/2018 at 3:35 PM  Between 7am to 6pm - Pager - [phone]  After 6pm go to [email or web address] - Social research officer, government  [facility] [place] [facility]  Office  [phone]  CC: Primary care physician; [name], MD   Note: This dictation was prepared with Dragon dictation along with smaller phrase technology. Any transcriptional errors that result from this process are unintentional.

## 2018-04-19 NOTE — Care Management (Signed)
Patient admitted from home with alcohol withdrawal and subdural hematoma.  PCP Hande.  Pharmacy [facility].  Patient not discharging on any new medications.  Patient has RW and ane in the home.  CSW has met with patient, and patient plans to return to his parents home at discharge.  Patient has ambulated around nursing station utilizing a RW with staff. Reported that patient utilizes [employer] for transportation at baseline

## 2018-04-19 NOTE — Progress Notes (Signed)
The patient was called and did not answer the phone Dr. Imogene Burnhen wanted him to be notify not to take his aspirin. Discharged instructions provided.

## 2018-04-19 NOTE — Progress Notes (Addendum)
Dr. Sena Hitchheng messaged: The social worker and I went to the patient's room. Patient  refusing to be discharged do to weakness, and does not have someone to take care of him. His father is old and wont be able to care for him. He indicated if he goes home he will moslilky fall and end up being hospitalized again. He indicates he talked to you but you talk to fast for him and were not able to undestand that he wanted to stay today to built up strenght and be discharge tomorrow.

## 2018-04-19 NOTE — Discharge Instructions (Signed)
Smoking and alcohol cessation. Follow up pain clinic.

## 2018-05-16 ENCOUNTER — Encounter: Payer: Self-pay | Admitting: Emergency Medicine

## 2018-05-16 ENCOUNTER — Inpatient Hospital Stay
Admission: EM | Admit: 2018-05-16 | Discharge: 2018-05-21 | DRG: 897 | Disposition: A | Discharge status: Home / Self Care | Payer: Self-pay | Attending: Internal Medicine | Admitting: Internal Medicine

## 2018-05-16 ENCOUNTER — Other Ambulatory Visit: Payer: Self-pay

## 2018-05-16 DIAGNOSIS — G8929 Other chronic pain: Secondary | ICD-10-CM | POA: Diagnosis present

## 2018-05-16 DIAGNOSIS — F10929 Alcohol use, unspecified with intoxication, unspecified: Secondary | ICD-10-CM | POA: Diagnosis present

## 2018-05-16 DIAGNOSIS — F10231 Alcohol dependence with withdrawal delirium: Secondary | ICD-10-CM | POA: Diagnosis present

## 2018-05-16 DIAGNOSIS — Z888 Allergy status to other drugs, medicaments and biological substances status: Secondary | ICD-10-CM

## 2018-05-16 DIAGNOSIS — F10229 Alcohol dependence with intoxication, unspecified: Principal | ICD-10-CM | POA: Diagnosis present

## 2018-05-16 DIAGNOSIS — F329 Major depressive disorder, single episode, unspecified: Secondary | ICD-10-CM | POA: Diagnosis present

## 2018-05-16 DIAGNOSIS — Z8261 Family history of arthritis: Secondary | ICD-10-CM

## 2018-05-16 DIAGNOSIS — F10931 Alcohol use, unspecified with withdrawal delirium: Secondary | ICD-10-CM

## 2018-05-16 DIAGNOSIS — F10939 Alcohol use, unspecified with withdrawal, unspecified: Secondary | ICD-10-CM

## 2018-05-16 DIAGNOSIS — E119 Type 2 diabetes mellitus without complications: Secondary | ICD-10-CM | POA: Diagnosis present

## 2018-05-16 DIAGNOSIS — E876 Hypokalemia: Secondary | ICD-10-CM | POA: Diagnosis present

## 2018-05-16 DIAGNOSIS — I480 Paroxysmal atrial fibrillation: Secondary | ICD-10-CM | POA: Diagnosis present

## 2018-05-16 DIAGNOSIS — F17210 Nicotine dependence, cigarettes, uncomplicated: Secondary | ICD-10-CM | POA: Diagnosis present

## 2018-05-16 DIAGNOSIS — I5042 Chronic combined systolic (congestive) and diastolic (congestive) heart failure: Secondary | ICD-10-CM | POA: Diagnosis present

## 2018-05-16 DIAGNOSIS — F10239 Alcohol dependence with withdrawal, unspecified: Secondary | ICD-10-CM

## 2018-05-16 DIAGNOSIS — F419 Anxiety disorder, unspecified: Secondary | ICD-10-CM | POA: Diagnosis present

## 2018-05-16 DIAGNOSIS — G7000 Myasthenia gravis without (acute) exacerbation: Secondary | ICD-10-CM | POA: Diagnosis present

## 2018-05-16 DIAGNOSIS — Z886 Allergy status to analgesic agent status: Secondary | ICD-10-CM

## 2018-05-16 DIAGNOSIS — R443 Hallucinations, unspecified: Secondary | ICD-10-CM | POA: Diagnosis present

## 2018-05-16 DIAGNOSIS — I110 Hypertensive heart disease with heart failure: Secondary | ICD-10-CM | POA: Diagnosis present

## 2018-05-16 DIAGNOSIS — Z79891 Long term (current) use of opiate analgesic: Secondary | ICD-10-CM

## 2018-05-16 DIAGNOSIS — Z89422 Acquired absence of other left toe(s): Secondary | ICD-10-CM

## 2018-05-16 DIAGNOSIS — Z87820 Personal history of traumatic brain injury: Secondary | ICD-10-CM

## 2018-05-16 DIAGNOSIS — Z79899 Other long term (current) drug therapy: Secondary | ICD-10-CM

## 2018-05-16 LAB — CBC
HCT: 41.5 % (ref 39.0–52.0)
HEMOGLOBIN: 13.4 g/dL (ref 13.0–17.0)
MCH: 26.5 pg (ref 26.0–34.0)
MCHC: 32.3 g/dL (ref 30.0–36.0)
MCV: 82 fL (ref 80.0–100.0)
Platelets: 356 10*3/uL (ref 150–400)
RBC: 5.06 MIL/uL (ref 4.22–5.81)
RDW: 20.1 % — ABNORMAL HIGH (ref 11.5–15.5)
WBC: 12.3 10*3/uL — ABNORMAL HIGH (ref 4.0–10.5)
nRBC: 0 % (ref 0.0–0.2)

## 2018-05-16 LAB — COMPREHENSIVE METABOLIC PANEL
ALT: 34 U/L (ref 0–44)
AST: 50 U/L — ABNORMAL HIGH (ref 15–41)
Albumin: 3.5 g/dL (ref 3.5–5.0)
Alkaline Phosphatase: 105 U/L (ref 38–126)
Anion gap: 16 — ABNORMAL HIGH (ref 5–15)
BUN: 14 mg/dL (ref 6–20)
CO2: 21 mmol/L — ABNORMAL LOW (ref 22–32)
Calcium: 8.1 mg/dL — ABNORMAL LOW (ref 8.9–10.3)
Chloride: 99 mmol/L (ref 98–111)
Creatinine, Ser: 1 mg/dL (ref 0.61–1.24)
GFR calc Af Amer: >60 mL/min (ref >60)
GFR calc non Af Amer: >60 mL/min (ref >60)
Glucose, Bld: 215 mg/dL — ABNORMAL HIGH (ref 70–99)
Potassium: 2.9 mmol/L — ABNORMAL LOW (ref 3.5–5.1)
SODIUM: 136 mmol/L (ref 135–145)
Total Bilirubin: 0.7 mg/dL (ref 0.3–1.2)
Total Protein: 6.6 g/dL (ref 6.5–8.1)

## 2018-05-16 LAB — GLUCOSE, CAPILLARY: GLUCOSE-CAPILLARY: 113 mg/dL — AB (ref 70–99)

## 2018-05-16 LAB — ETHANOL: Alcohol, Ethyl (B): 326 mg/dL (ref <10)

## 2018-05-16 MED ORDER — LORAZEPAM 2 MG PO TABS: 0.0000 mg | ORAL_TABLET | Freq: Two times a day (BID) | ORAL | Status: DC

## 2018-05-16 MED ORDER — THIAMINE HCL 100 MG/ML IJ SOLN: 100.0000 mg | Freq: Every day | INTRAMUSCULAR | Status: DC

## 2018-05-16 MED ORDER — SODIUM CHLORIDE 0.9 % IV BOLUS
1000.0000 mL | Freq: Once | INTRAVENOUS | Status: AC
  Administered 2018-05-16: 1000 mL via INTRAVENOUS

## 2018-05-16 MED ORDER — IBUPROFEN 600 MG PO TABS
600.0000 mg | ORAL_TABLET | Freq: Once | ORAL | Status: AC
  Administered 2018-05-16: 600 mg via ORAL
  Filled 2018-05-16: qty 1

## 2018-05-16 MED ORDER — VITAMIN B-1 100 MG PO TABS: 100.0000 mg | ORAL_TABLET | Freq: Every day | ORAL | Status: DC

## 2018-05-16 MED ORDER — LORAZEPAM 2 MG/ML IJ SOLN
1.0000 mg | Freq: Once | INTRAMUSCULAR | Status: AC
  Administered 2018-05-16: 1 mg via INTRAVENOUS
  Filled 2018-05-16: qty 1

## 2018-05-16 MED ORDER — LORAZEPAM 2 MG/ML IJ SOLN: 0.0000 mg | Freq: Two times a day (BID) | INTRAMUSCULAR | Status: DC

## 2018-05-16 MED ORDER — THIAMINE HCL 100 MG/ML IJ SOLN
100.0000 mg | Freq: Once | INTRAMUSCULAR | Status: AC
  Administered 2018-05-16: 100 mg via INTRAVENOUS
  Filled 2018-05-16: qty 2

## 2018-05-16 MED ORDER — LORAZEPAM 2 MG PO TABS: 0.0000 mg | ORAL_TABLET | Freq: Four times a day (QID) | ORAL | Status: DC

## 2018-05-16 MED ORDER — THIAMINE HCL 100 MG/ML IJ SOLN
INTRAVENOUS | Status: AC
  Administered 2018-05-17: 02:00:00 via INTRAVENOUS
  Filled 2018-05-16: qty 1000

## 2018-05-16 MED ORDER — LORAZEPAM 2 MG/ML IJ SOLN
0.0000 mg | Freq: Four times a day (QID) | INTRAMUSCULAR | Status: DC
  Administered 2018-05-16 – 2018-05-17 (×2): 2 mg via INTRAVENOUS
  Filled 2018-05-16 (×2): qty 1

## 2018-05-16 NOTE — ED Notes (Signed)
Pt provided ice chips per request.

## 2018-05-16 NOTE — ED Provider Notes (Signed)
-----------------------------------------   7:21 PM on 05/16/2018 -----------------------------------------  Patient care assumed from Dr. Don Perking.  Patient now appears to be in active withdrawal, shaking, sweaty, blood pressure elevated heart rate of 140.  Patient receiving IV Ativan.  We will place the patient on CIWA protocols.  Patient will require admission to the hospitalist service for further treatment.   Minna Antis, MD 05/16/18 1921

## 2018-05-16 NOTE — Progress Notes (Signed)
   05/16/18 1335  Clinical Encounter Type  Visited With Patient  Visit Type Initial;Spiritual support  Referral From Nurse  Consult/Referral To Chaplain  Spiritual Encounters  Spiritual Needs Prayer;Other (Comment)   [place] visited with Kurt Horton as I have met him many times in the past at [facility]. He appears to be intoxicated and began to cry when I talked to him. I asked him how was he doing and he said "not good." I will follow up as needed.

## 2018-05-16 NOTE — H&P (Addendum)
[facility] - [place] at [facility]   PATIENT NAME: Kurt Horton    MR#:  [id number]  DATE OF BIRTH:  01/21/1961  DATE OF ADMISSION:  05/16/2018  PRIMARY CARE PHYSICIAN: [name], MD   REQUESTING/REFERRING PHYSICIAN: Minna Antis, MD  CHIEF COMPLAINT:   Chief Complaint  Patient presents with  . Alcohol Intoxication    HISTORY OF PRESENT ILLNESS:  Kurt Horton  is a 58 y.o. male with a known history of alcohol abuse, A. fib, type 2 diabetes, hypertension who came into the ED with alcohol intoxication.  He was in an [place] this afternoon, when he apparently was noted to be very intoxicated.  The Benedetto Goad driver decided to bring him to the ED for evaluation.  He has a history of multiple readmissions for alcohol intoxication and withdrawal.  He is obviously intoxicated in the ED.  He endorses some chronic low back pain.  Denies any chest pain, palpitations, shortness of breath.  In the ED, he was tachycardic with heart rate 103-143.  He was mildly hypertensive.  Labs were significant for K2.9, WBC 12.3, alcohol 326.  Due to his tachycardia and history of DTs, hospitalists were called for admission.  PAST MEDICAL HISTORY:   Past Medical History:  Diagnosis Date  . A-fib (HCC)   . Alcohol abuse   . Alcohol withdrawal (HCC) 11/12/2016  . Diabetes mellitus without complication (HCC)   . DJD (degenerative joint disease)   . Hypertension   . Myasthenia gravis (HCC)   . Renal disorder     PAST SURGICAL HISTORY:   Past Surgical History:  Procedure Laterality Date  . AMPUTATION TOE Left 12/29/2015   Procedure: AMPUTATION TOE;  Surgeon: Gwyneth Revels, DPM;  Location: [facility] ORS;  Service: Podiatry;  Laterality: Left;  . ESOPHAGOGASTRODUODENOSCOPY N/A 02/05/2018   Procedure: ESOPHAGOGASTRODUODENOSCOPY (EGD);  Surgeon: Pasty Spillers, MD;  Location: [facility] ENDOSCOPY;  Service: Endoscopy;  Laterality: N/A;  . PERIPHERAL VASCULAR CATHETERIZATION N/A 12/27/2015   Procedure: Lower Extremity Angiography;  Surgeon: Annice Needy, MD;  Location: [facility] INVASIVE CV LAB;  Service: Cardiovascular;  Laterality: N/A;  . right hip surgery    . right shoulder surgery      SOCIAL HISTORY:   Social History   Tobacco Use  . Smoking status: Current Every Day Smoker    Packs/day: 0.50    Types: Cigarettes  . Smokeless tobacco: Never Used  Substance Use Topics  . Alcohol use: Yes    Comment: Pt had 20 airplane bottles today    FAMILY HISTORY:   Family History  Problem Relation Age of Onset  . Rheum arthritis Father     DRUG ALLERGIES:   Allergies  Allergen Reactions  . Nsaids Other (See Comments)    Duodenal ulcers, GI bleeding  . Other Other (See Comments)    Patient states he can't take any "mycin" medications because myasthenia gravis.    REVIEW OF SYSTEMS:   Review of Systems  Constitutional: Negative for chills and fever.  HENT: Negative for congestion and sore throat.   Eyes: Negative for blurred vision and double vision.  Respiratory: Negative for cough and shortness of breath.   Cardiovascular: Negative for chest pain, palpitations and leg swelling.  Gastrointestinal: Negative for nausea and vomiting.  Genitourinary: Negative for dysuria and urgency.  Musculoskeletal: Positive for back pain and neck pain.  Neurological: Negative for dizziness and headaches.  Psychiatric/Behavioral: Negative for depression. The patient is not nervous/anxious.     MEDICATIONS AT  HOME:   Prior to Admission medications   Medication Sig Start Date End Date Taking? Authorizing Provider  DULoxetine (CYMBALTA) 30 MG capsule Take 30 mg by mouth daily at 6 (six) AM. 03/20/18 03/20/19 Yes [provider]  amiodarone (PACERONE) 200 MG tablet Take 1 tablet (200 mg total) by mouth daily. 11/02/17   Alford HighlandWieting, Richard, MD  B Complex-C (B-COMPLEX WITH VITAMIN C) tablet Take 1 tablet by mouth daily. 11/02/17   Alford HighlandWieting, Richard, MD  busPIRone (BUSPAR) 15 MG  tablet Take 1 tablet (15 mg total) by mouth 2 (two) times daily. 04/10/18   Ramonita LabGouru, Aruna, MD  diltiazem (CARDIZEM CD) 180 MG 24 hr capsule Take 1 capsule (180 mg total) by mouth daily. 11/02/17 04/15/18  Alford HighlandWieting, Richard, MD  folic acid (FOLVITE) 1 MG tablet Take 1 tablet (1 mg total) by mouth daily. 04/19/18   Shaune Pollackhen, Qing, MD  furosemide (LASIX) 40 MG tablet Take 40 mg by mouth daily.    [provider]  gabapentin (NEURONTIN) 300 MG capsule Take 2 capsules (600 mg total) by mouth 3 (three) times daily. 11/02/17   Alford HighlandWieting, Richard, MD  lisinopril (PRINIVIL,ZESTRIL) 10 MG tablet Take 10 mg by mouth daily.    [provider]  metFORMIN (GLUCOPHAGE) 500 MG tablet Take 1 tablet (500 mg total) by mouth daily. 11/02/17   Alford HighlandWieting, Richard, MD  metoprolol succinate (TOPROL-XL) 50 MG 24 hr tablet Take 1 tablet (50 mg total) by mouth daily. Take with or immediately following a meal. 11/02/17   Alford HighlandWieting, Richard, MD  multivitamin (ONE-A-DAY MEN'S) TABS tablet Take 1 tablet by mouth daily. 04/19/18   Shaune Pollackhen, Qing, MD  mycophenolate (CELLCEPT) 500 MG tablet Take 1 tablet (500 mg total) by mouth 2 (two) times daily. Take 1 tablet (500MG ) by mouth every morning and 2 tablets (1000MG ) by mouth every evening Patient taking differently: Take 500-1,000 mg by mouth See admin instructions. 500 mg every morning and 1000 mg at bedtime 11/02/17   Alford HighlandWieting, Richard, MD  nicotine (NICODERM CQ - DOSED IN MG/24 HOURS) 14 mg/24hr patch Place 1 patch (14 mg total) onto the skin daily. 04/19/18   Shaune Pollackhen, Qing, MD  oxyCODONE (OXY IR/ROXICODONE) 5 MG immediate release tablet Take 1-2 tablets (5-10 mg total) by mouth every 4 (four) hours as needed for moderate pain or severe pain. Patient taking differently: Take 10 mg by mouth daily as needed for moderate pain or severe pain.  02/07/18   Katha HammingKonidena, Snehalatha, MD  senna-docusate (SENOKOT-S) 8.6-50 MG tablet Take 1 tablet by mouth at bedtime as needed for mild constipation. Patient  taking differently: Take 1 tablet by mouth daily.  12/25/17   , Allyn KennerKaty Dodd, MD  testosterone cypionate (DEPOTESTOSTERONE CYPIONATE) 200 MG/ML injection Inject 200 mg into the muscle every 14 (fourteen) days.    [provider]  thiamine 100 MG tablet Take 1 tablet (100 mg total) by mouth daily. 04/19/18   Shaune Pollackhen, Qing, MD      VITAL SIGNS:  Blood pressure (!) 149/113, pulse (!) 143, resp. rate 19, height 5\' 11"  (1.803 m), weight 81.6 kg, SpO2 (!) 89 %.  PHYSICAL EXAMINATION:  Physical Exam  GENERAL:  58 y.o.-year-old patient lying in the bed with no acute distress.  Patient is intoxicated. EYES: Pupils equal, round, reactive to light and accommodation. No scleral icterus. Extraocular muscles intact.  HEENT: Head atraumatic, normocephalic. Oropharynx and nasopharynx clear.  NECK:  Supple, no jugular venous distention. No thyroid enlargement, no tenderness.  LUNGS: Normal breath sounds bilaterally,  no wheezing, rales,rhonchi or crepitation. No use of accessory muscles of respiration.  CARDIOVASCULAR: Tachycardic, regular rhythm. S1, S2 normal. No murmurs, rubs, or gallops.  ABDOMEN: Soft, nontender, nondistended. Bowel sounds present. No organomegaly or mass.  EXTREMITIES: No pedal edema, cyanosis, or clubbing.  NEUROLOGIC: Difficult to assess, as patient does not follow commands.  Mildly tremulous. PSYCHIATRIC: Sleeping but arousable.  Does not answer questions of orientation. SKIN: No obvious rash, lesion, or ulcer.   LABORATORY PANEL:   CBC Recent Labs  Lab 05/16/18 1322  WBC 12.3*  HGB 13.4  HCT 41.5  PLT 356   ------------------------------------------------------------------------------------------------------------------  Chemistries  Recent Labs  Lab 05/16/18 1445  NA 136  K 2.9*  CL 99  CO2 21*  GLUCOSE 215*  BUN 14  CREATININE 1.00  CALCIUM 8.1*  AST 50*  ALT 34  ALKPHOS 105  BILITOT 0.7    ------------------------------------------------------------------------------------------------------------------  Cardiac Enzymes No results for input(s): TROPONINI in the last 168 hours. ------------------------------------------------------------------------------------------------------------------  RADIOLOGY:  No results found.   IMPRESSION AND PLAN:   Alcohol intoxication/withdrawal-patient has repeated admissions for this. -CIWA protocol -Start banana bag  Paroxysmal atrial fibrillation- currently with sinus tachycardia -Continue amiodarone, diltiazem, metoprolol  Hypokalemia-K3.2 -Replete and recheck  Type 2 diabetes-takes metformin at home -Sensitive SSI  Recent history of subdural hematoma- seen on CT head 04/15/2018.  Unclear if patient had any head trauma today.  No signs of head trauma on exam. -Will repeat CT head -Hold on anticoagulation  Chronic combined systolic and diastolic heart failure-appears dry on exam -Banana bag for 12 hours -Hold Lasix for now, can likely restart in the morning  Depression- likely uncontrolled -Continue Buspar and Cymbalta  History of myasthenia gravis- no signs of acute exacerbation -Continue CellCept  Chronic low back pain -Continue home pain meds  All the records are reviewed and case discussed with ED provider. Management plans discussed with the patient, family and they are in agreement.  CODE STATUS: Full  TOTAL TIME TAKING CARE OF THIS PATIENT: 45 minutes.    Jinny BlossomKaty D  M.D on 05/16/2018 at 9:44 PM  Between 7am to 6pm - Pager - [phone]  After 6pm go to [email or web address] - Social research officer, governmentpassword EPAS [facility]  [facility]  Office  [phone]  CC: Primary care physician; Barbette ReichmannHande, Vishwanath, MD   Note: This dictation was prepared with Dragon dictation along with smaller phrase technology. Any transcriptional errors that result from this process are unintentional.

## 2018-05-16 NOTE — ED Notes (Signed)
Attempted to call report, RN unavailable at this time.

## 2018-05-16 NOTE — ED Triage Notes (Signed)
Pt to ED via EMS was picked up from [place] after drinking 20 airplane bottles of rum, denies SI/HI, presents A&Ox4, states pain is chronic lower back pain.  Requesting alcohol detox.  EMS vitals 147/98 BP, 91% RA, 297 CBG.  Pt soiled self, being cleaned and changed.  Pt with old left shoulder and elbow wounds that are scabbed over, newer looking abrasions to right hip, states has fallen recently.

## 2018-05-16 NOTE — ED Provider Notes (Signed)
[facility] Emergency Department Provider Note  ____________________________________________  Time seen: Approximately 2:45 PM  I have reviewed the triage vital signs and the nursing notes.   HISTORY  Chief Complaint Alcohol Intoxication  Level 5 caveat:  Portions of the history and physical were unable to be obtained due to intoxication   HPI Kurt Horton is a 58 y.o. male h/o alcohol abuse, DM, afib, HTN who presents for evaluation of alcohol intoxication. Patient arrives via EMS from a [place] vehicle.  Benedetto Goad driver called 410 since patient was extremely intoxicated.  Patient reports drinking 20 airplane bottles of rum.  Reports history of alcohol abuse and daily consumption of large volume of alcohol.  Patient is extremely intoxicated with slurred speech and slow to answer questions. Patient denies CP, SOB, HA, abdominal pain or any other drug use. His only complained is lower back pain which is chronic.  No saddle anesthesia, bilateral lower extremity weakness, urinary or bowel incontinence or retention. Patient denies SI or HI  Past Medical History:  Diagnosis Date  . A-fib (HCC)   . Alcohol abuse   . Alcohol withdrawal (HCC) 11/12/2016  . Diabetes mellitus without complication (HCC)   . DJD (degenerative joint disease)   . Hypertension   . Myasthenia gravis (HCC)   . Renal disorder     Patient Active Problem List   Diagnosis Date Noted  . GI bleed 02/04/2018  . Alcohol intoxication (HCC) 12/22/2017  . Malnutrition of moderate degree 2020/08/817  . Chronic combined systolic and diastolic CHF (congestive heart failure) (HCC) 08/13/2017  . Alcohol withdrawal delirium (HCC) 02/26/2017  . Atrial fibrillation with RVR (HCC) 02/25/2017  . Acute alcoholic intoxication with complication (HCC)   . Alcohol abuse   . Alcohol withdrawal (HCC) 11/13/2016  . Substance induced mood disorder (HCC) 11/13/2016  . SVT (supraventricular tachycardia) (HCC)  11/12/2016  . HTN (hypertension) 11/12/2016  . EtOH dependence (HCC) 11/12/2016  . Lumbar radiculopathy 08/24/2016  . Depression with anxiety 08/24/2016  . Chronic low back pain 01/04/2016  . Pressure ulcer 12/29/2015  . Osteomyelitis (HCC) 12/26/2015  . PVD (peripheral vascular disease) (HCC) 12/26/2015  . Type II diabetes mellitus with manifestations (HCC) 12/26/2015  . Myasthenia gravis (HCC) 12/26/2015    Past Surgical History:  Procedure Laterality Date  . AMPUTATION TOE Left 12/29/2015   Procedure: AMPUTATION TOE;  Surgeon: Gwyneth Revels, DPM;  Location: [facility] ORS;  Service: Podiatry;  Laterality: Left;  . ESOPHAGOGASTRODUODENOSCOPY N/A 02/05/2018   Procedure: ESOPHAGOGASTRODUODENOSCOPY (EGD);  Surgeon: Pasty Spillers, MD;  [facility] ENDOSCOPY;  Service: Endoscopy;  Laterality: N/A;  . PERIPHERAL VASCULAR CATHETERIZATION N/A 12/27/2015   Procedure: Lower Extremity Angiography;  Surgeon: Annice Needy, MD;  Location: [facility] INVASIVE CV LAB;  Service: Cardiovascular;  Laterality: N/A;  . right hip surgery    . right shoulder surgery      Prior to Admission medications   Medication Sig Start Date End Date Taking? Authorizing Provider  amiodarone (PACERONE) 200 MG tablet Take 1 tablet (200 mg total) by mouth daily. 11/02/17   Alford Highland, MD  B Complex-C (B-COMPLEX WITH VITAMIN C) tablet Take 1 tablet by mouth daily. 11/02/17   Alford Highland, MD  busPIRone (BUSPAR) 15 MG tablet Take 1 tablet (15 mg total) by mouth 2 (two) times daily. 04/10/18   Ramonita Lab, MD  diltiazem (CARDIZEM CD) 180 MG 24 hr capsule Take 1 capsule (180 mg total) by mouth daily. 11/02/17 04/15/18  Alford Highland, MD  folic acid (FOLVITE) 1 MG tablet Take 1 tablet (1 mg total) by mouth daily. 04/19/18   Shaune Pollackhen, Qing, MD  furosemide (LASIX) 40 MG tablet Take 40 mg by mouth daily.    [provider]  gabapentin (NEURONTIN) 300 MG capsule Take 2 capsules (600 mg total) by mouth 3 (three) times  daily. 11/02/17   Alford HighlandWieting, Richard, MD  lisinopril (PRINIVIL,ZESTRIL) 10 MG tablet Take 10 mg by mouth daily.    [provider]  metFORMIN (GLUCOPHAGE) 500 MG tablet Take 1 tablet (500 mg total) by mouth daily. 11/02/17   Alford HighlandWieting, Richard, MD  metoprolol succinate (TOPROL-XL) 50 MG 24 hr tablet Take 1 tablet (50 mg total) by mouth daily. Take with or immediately following a meal. 11/02/17   Alford HighlandWieting, Richard, MD  multivitamin (ONE-A-DAY MEN'S) TABS tablet Take 1 tablet by mouth daily. 04/19/18   Shaune Pollackhen, Qing, MD  mycophenolate (CELLCEPT) 500 MG tablet Take 1 tablet (500 mg total) by mouth 2 (two) times daily. Take 1 tablet (500MG ) by mouth every morning and 2 tablets (1000MG ) by mouth every evening Patient taking differently: Take 500-1,000 mg by mouth See admin instructions. 500 mg every morning and 1000 mg at bedtime 11/02/17   Alford HighlandWieting, Richard, MD  nicotine (NICODERM CQ - DOSED IN MG/24 HOURS) 14 mg/24hr patch Place 1 patch (14 mg total) onto the skin daily. 04/19/18   Shaune Pollackhen, Qing, MD  oxyCODONE (OXY IR/ROXICODONE) 5 MG immediate release tablet Take 1-2 tablets (5-10 mg total) by mouth every 4 (four) hours as needed for moderate pain or severe pain. Patient taking differently: Take 10 mg by mouth daily as needed for moderate pain or severe pain.  02/07/18   Katha HammingKonidena, Snehalatha, MD  senna-docusate (SENOKOT-S) 8.6-50 MG tablet Take 1 tablet by mouth at bedtime as needed for mild constipation. Patient taking differently: Take 1 tablet by mouth daily.  12/25/17   Mayo, Allyn KennerKaty Dodd, MD  testosterone cypionate (DEPOTESTOSTERONE CYPIONATE) 200 MG/ML injection Inject 200 mg into the muscle every 14 (fourteen) days.    [provider]  thiamine 100 MG tablet Take 1 tablet (100 mg total) by mouth daily. 04/19/18   Shaune Pollackhen, Qing, MD    Allergies Nsaids and Other  Family History  Problem Relation Age of Onset  . Rheum arthritis Father     Social History Social History   Tobacco Use  . Smoking  status: Current Every Day Smoker    Packs/day: 0.50    Types: Cigarettes  . Smokeless tobacco: Never Used  Substance Use Topics  . Alcohol use: Yes    Comment: Pt had 20 airplane bottles today  . Drug use: No    Review of Systems  Constitutional: Negative for fever. Cardiovascular: Negative for chest pain. Respiratory: Negative for shortness of breath. Gastrointestinal: Negative for abdominal pain, vomiting or diarrhea. Musculoskeletal: + back pain. Neurological: Negative for headaches, weakness or numbness. Psych: No SI or HI  ____________________________________________   PHYSICAL EXAM:  VITAL SIGNS: ED Triage Vitals  Enc Vitals Group     BP 05/16/18 1416 (!) 156/98     Pulse Rate 05/16/18 1416 (!) 103     Resp --      Temp --      Temp src --      SpO2 05/16/18 1416 95 %     Weight 05/16/18 1320 180 lb (81.6 kg)     Height 05/16/18 1320 5\' 11"  (1.803 m)     Head Circumference --  Peak Flow --      Pain Score 05/16/18 1320 9     Pain Loc --      Pain Edu? --      Excl. in GC? --     Constitutional: Sleeping, hard to arouses, but arouses with sternum rub, intoxicated HEENT:      Head: Normocephalic and atraumatic.         Eyes: Conjunctivae are normal. Sclera is non-icteric.       Mouth/Throat: Mucous membranes are dry.       Neck: Supple with no signs of meningismus. Cardiovascular: Tachycardic with regular rhythm. No murmurs, gallops, or rubs. Respiratory: Normal respiratory effort. Lungs are clear to auscultation bilaterally. No wheezes, crackles, or rhonchi.  Gastrointestinal: Soft, non tender, and non distended with positive bowel sounds. No rebound or guarding. Musculoskeletal: No edema, cyanosis, or erythema of extremities.  No CT and L-spine tenderness. Neurologic: Slurred speech. Face is symmetric. Moving all extremities. No gross focal neurologic deficits are appreciated. Skin: Skin is warm, dry and intact. No rash noted. Psychiatric: Mood and  affect are normal. Speech and behavior are normal.  ____________________________________________   LABS (all labs ordered are listed, but only abnormal results are displayed)  Labs Reviewed  CBC - Abnormal; Notable for the following components:      Result Value   WBC 12.3 (*)    RDW 20.1 (*)    All other components within normal limits  URINE DRUG SCREEN, QUALITATIVE ([facility] ONLY)  COMPREHENSIVE METABOLIC PANEL  ETHANOL   ____________________________________________  EKG  none  ____________________________________________  RADIOLOGY  none  ____________________________________________   PROCEDURES  Procedure(s) performed: None Procedures Critical Care performed:  None ____________________________________________   INITIAL IMPRESSION / ASSESSMENT AND PLAN / ED COURSE  58 y.o. male h/o alcohol abuse, DM, afib, HTN who presents for evaluation of alcohol intoxication.  Patient is extremely intoxicated with no signs of trauma.  Intoxicated.  Hard to arouse but does arouse with sternal rub and is able to answer questions.  End-tidal was placed to for close monitoring of respirations and so far it has been normal.  Patient looks extremely dry.  Was given fluids and thiamine.  No SI or HI. Labs pending. Will monitor respiratory status closely.   Clinical Course as of May 16 1514  Thu May 16, 2018  1515 Labs pending. Care transferred to Dr. Lenard LancePaduchowski   [CV]    Clinical Course User Index [CV] Don PerkingVeronese, [place], MD     As part of my medical decision making, I reviewed the following data within the electronic MEDICAL RECORD NUMBER Nursing notes reviewed and incorporated, Labs reviewed , Old chart reviewed, Notes from prior ED visits and [place] Controlled Substance Database    Pertinent labs & imaging results that were available during my care of the patient were reviewed by me and considered in my medical decision making (see chart for  details).    ____________________________________________   FINAL CLINICAL IMPRESSION(S) / ED DIAGNOSES  Final diagnoses:  Acute alcoholic intoxication with complication (HCC)      NEW MEDICATIONS STARTED DURING THIS VISIT:  ED Discharge Orders    None       Note:  This document was prepared using Dragon voice recognition software and may include unintentional dictation errors.    Don PerkingVeronese, [place], MD 05/16/18 [phone]

## 2018-05-16 NOTE — ED Notes (Signed)
Ashley RN, aware of bed assigned  

## 2018-05-17 ENCOUNTER — Other Ambulatory Visit: Payer: Self-pay

## 2018-05-17 ENCOUNTER — Inpatient Hospital Stay: Payer: Self-pay

## 2018-05-17 LAB — CBC
HCT: 34 % — ABNORMAL LOW (ref 39.0–52.0)
Hemoglobin: 10.9 g/dL — ABNORMAL LOW (ref 13.0–17.0)
MCH: 26 pg (ref 26.0–34.0)
MCHC: 32.1 g/dL (ref 30.0–36.0)
MCV: 81 fL (ref 80.0–100.0)
Platelets: 263 10*3/uL (ref 150–400)
RBC: 4.2 MIL/uL — ABNORMAL LOW (ref 4.22–5.81)
RDW: 20 % — ABNORMAL HIGH (ref 11.5–15.5)
WBC: 10 10*3/uL (ref 4.0–10.5)
nRBC: 0 % (ref 0.0–0.2)

## 2018-05-17 LAB — GLUCOSE, CAPILLARY
GLUCOSE-CAPILLARY: 135 mg/dL — AB (ref 70–99)
GLUCOSE-CAPILLARY: 135 mg/dL — AB (ref 70–99)
Glucose-Capillary: 119 mg/dL — ABNORMAL HIGH (ref 70–99)
Glucose-Capillary: 125 mg/dL — ABNORMAL HIGH (ref 70–99)
Glucose-Capillary: 155 mg/dL — ABNORMAL HIGH (ref 70–99)
Glucose-Capillary: 124 mg/dL — ABNORMAL HIGH (ref 70–99)

## 2018-05-17 LAB — URINE DRUG SCREEN, QUALITATIVE (ARMC ONLY)
AMPHETAMINES, UR SCREEN: NOT DETECTED
Barbiturates, Ur Screen: NOT DETECTED
Benzodiazepine, Ur Scrn: POSITIVE — AB
Cannabinoid 50 Ng, Ur ~~LOC~~: NOT DETECTED
Cocaine Metabolite,Ur ~~LOC~~: NOT DETECTED
MDMA (Ecstasy)Ur Screen: NOT DETECTED
Methadone Scn, Ur: NOT DETECTED
Opiate, Ur Screen: NOT DETECTED
Phencyclidine (PCP) Ur S: NOT DETECTED
Tricyclic, Ur Screen: NOT DETECTED

## 2018-05-17 LAB — BASIC METABOLIC PANEL
Anion gap: 8 (ref 5–15)
BUN: 16 mg/dL (ref 6–20)
CO2: 25 mmol/L (ref 22–32)
Calcium: 8 mg/dL — ABNORMAL LOW (ref 8.9–10.3)
Chloride: 102 mmol/L (ref 98–111)
Creatinine, Ser: 0.76 mg/dL (ref 0.61–1.24)
GFR calc Af Amer: >60 mL/min (ref >60)
GFR calc non Af Amer: >60 mL/min (ref >60)
Glucose, Bld: 123 mg/dL — ABNORMAL HIGH (ref 70–99)
POTASSIUM: 3.7 mmol/L (ref 3.5–5.1)
Sodium: 135 mmol/L (ref 135–145)

## 2018-05-17 MED ORDER — MYCOPHENOLATE MOFETIL 250 MG PO CAPS
1000.0000 mg | ORAL_CAPSULE | Freq: Every day | ORAL | Status: DC
  Administered 2018-05-17 – 2018-05-20 (×4): 1000 mg via ORAL
  Filled 2018-05-17 (×5): qty 4

## 2018-05-17 MED ORDER — ACETAMINOPHEN 325 MG PO TABS
650.0000 mg | ORAL_TABLET | Freq: Three times a day (TID) | ORAL | Status: DC
  Administered 2018-05-17 – 2018-05-21 (×12): 650 mg via ORAL
  Filled 2018-05-17 (×12): qty 2

## 2018-05-17 MED ORDER — ONDANSETRON HCL 4 MG PO TABS: 4.0000 mg | ORAL_TABLET | Freq: Four times a day (QID) | ORAL | Status: DC | PRN

## 2018-05-17 MED ORDER — ACETAMINOPHEN 650 MG RE SUPP: 650.0000 mg | Freq: Four times a day (QID) | RECTAL | Status: DC | PRN

## 2018-05-17 MED ORDER — NICOTINE 14 MG/24HR TD PT24
14.0000 mg | MEDICATED_PATCH | Freq: Every day | TRANSDERMAL | Status: DC
  Administered 2018-05-17 – 2018-05-21 (×5): 14 mg via TRANSDERMAL
  Filled 2018-05-17 (×5): qty 1

## 2018-05-17 MED ORDER — DILTIAZEM HCL ER COATED BEADS 180 MG PO CP24
180.0000 mg | ORAL_CAPSULE | Freq: Every day | ORAL | Status: DC
  Administered 2018-05-17 – 2018-05-21 (×5): 180 mg via ORAL
  Filled 2018-05-17 (×5): qty 1

## 2018-05-17 MED ORDER — BUSPIRONE HCL 15 MG PO TABS
15.0000 mg | ORAL_TABLET | Freq: Two times a day (BID) | ORAL | Status: DC
  Administered 2018-05-17 – 2018-05-21 (×9): 15 mg via ORAL
  Filled 2018-05-17 (×10): qty 1

## 2018-05-17 MED ORDER — ALUM & MAG HYDROXIDE-SIMETH 200-200-20 MG/5ML PO SUSP: 15.0000 mL | Freq: Four times a day (QID) | ORAL | Status: DC | PRN

## 2018-05-17 MED ORDER — LORAZEPAM 1 MG PO TABS
1.0000 mg | ORAL_TABLET | Freq: Four times a day (QID) | ORAL | Status: AC | PRN
  Administered 2018-05-18 – 2018-05-19 (×3): 1 mg via ORAL
  Filled 2018-05-17 (×3): qty 1

## 2018-05-17 MED ORDER — DULOXETINE HCL 30 MG PO CPEP
30.0000 mg | ORAL_CAPSULE | Freq: Every day | ORAL | Status: DC
  Administered 2018-05-17 – 2018-05-21 (×5): 30 mg via ORAL
  Filled 2018-05-17 (×5): qty 1

## 2018-05-17 MED ORDER — LISINOPRIL 10 MG PO TABS
10.0000 mg | ORAL_TABLET | Freq: Every day | ORAL | Status: DC
  Administered 2018-05-17 – 2018-05-21 (×5): 10 mg via ORAL
  Filled 2018-05-17 (×5): qty 1

## 2018-05-17 MED ORDER — INSULIN ASPART 100 UNIT/ML ~~LOC~~ SOLN: 0.0000 [IU] | Freq: Every day | SUBCUTANEOUS | Status: DC

## 2018-05-17 MED ORDER — POLYETHYLENE GLYCOL 3350 17 G PO PACK: 17.0000 g | PACK | Freq: Every day | ORAL | Status: DC | PRN

## 2018-05-17 MED ORDER — MYCOPHENOLATE MOFETIL 250 MG PO CAPS
500.0000 mg | ORAL_CAPSULE | Freq: Every day | ORAL | Status: DC
  Administered 2018-05-17 – 2018-05-21 (×5): 500 mg via ORAL
  Filled 2018-05-17 (×5): qty 2

## 2018-05-17 MED ORDER — OXYCODONE HCL 5 MG PO TABS
5.0000 mg | ORAL_TABLET | ORAL | Status: DC | PRN
  Administered 2018-05-17 (×2): 10 mg via ORAL
  Administered 2018-05-17 (×2): 5 mg via ORAL
  Administered 2018-05-17 – 2018-05-18 (×4): 10 mg via ORAL
  Administered 2018-05-18: 5 mg via ORAL
  Administered 2018-05-18 – 2018-05-21 (×11): 10 mg via ORAL
  Administered 2018-05-21 (×2): 5 mg via ORAL
  Filled 2018-05-17 (×2): qty 1
  Filled 2018-05-17 (×12): qty 2
  Filled 2018-05-17 (×2): qty 1
  Filled 2018-05-17 (×6): qty 2

## 2018-05-17 MED ORDER — CYCLOBENZAPRINE HCL 10 MG PO TABS
5.0000 mg | ORAL_TABLET | Freq: Three times a day (TID) | ORAL | Status: DC | PRN
  Administered 2018-05-17 – 2018-05-19 (×3): 5 mg via ORAL
  Filled 2018-05-17 (×3): qty 1

## 2018-05-17 MED ORDER — AMIODARONE HCL 200 MG PO TABS
200.0000 mg | ORAL_TABLET | Freq: Every day | ORAL | Status: DC
  Administered 2018-05-17 – 2018-05-21 (×5): 200 mg via ORAL
  Filled 2018-05-17 (×5): qty 1

## 2018-05-17 MED ORDER — ALUM & MAG HYDROXIDE-SIMETH 200-200-20 MG/5ML PO SUSP
30.0000 mL | Freq: Once | ORAL | Status: AC
  Administered 2018-05-17: 30 mL via ORAL
  Filled 2018-05-17: qty 30

## 2018-05-17 MED ORDER — ACETAMINOPHEN 650 MG RE SUPP
650.0000 mg | Freq: Three times a day (TID) | RECTAL | Status: DC
  Filled 2018-05-17: qty 1

## 2018-05-17 MED ORDER — MYCOPHENOLATE MOFETIL 500 MG PO TABS: 500.0000 mg | ORAL_TABLET | ORAL | Status: DC

## 2018-05-17 MED ORDER — METOPROLOL SUCCINATE ER 50 MG PO TB24
50.0000 mg | ORAL_TABLET | Freq: Every day | ORAL | Status: DC
  Administered 2018-05-17 – 2018-05-21 (×5): 50 mg via ORAL
  Filled 2018-05-17 (×5): qty 1

## 2018-05-17 MED ORDER — FUROSEMIDE 40 MG PO TABS
40.0000 mg | ORAL_TABLET | Freq: Every day | ORAL | Status: DC
  Administered 2018-05-17 – 2018-05-21 (×5): 40 mg via ORAL
  Filled 2018-05-17 (×5): qty 1

## 2018-05-17 MED ORDER — HYOSCYAMINE SULFATE 0.125 MG SL SUBL
0.2500 mg | SUBLINGUAL_TABLET | Freq: Once | SUBLINGUAL | Status: AC
  Administered 2018-05-17: 0.25 mg via SUBLINGUAL
  Filled 2018-05-17: qty 2

## 2018-05-17 MED ORDER — INSULIN ASPART 100 UNIT/ML ~~LOC~~ SOLN
0.0000 [IU] | Freq: Three times a day (TID) | SUBCUTANEOUS | Status: DC
  Administered 2018-05-17 (×2): 1 [IU] via SUBCUTANEOUS
  Administered 2018-05-17: 2 [IU] via SUBCUTANEOUS
  Administered 2018-05-18 – 2018-05-20 (×4): 1 [IU] via SUBCUTANEOUS
  Filled 2018-05-17 (×8): qty 1

## 2018-05-17 MED ORDER — ACETAMINOPHEN 325 MG PO TABS: 650.0000 mg | ORAL_TABLET | Freq: Four times a day (QID) | ORAL | Status: DC | PRN

## 2018-05-17 MED ORDER — GABAPENTIN 300 MG PO CAPS
600.0000 mg | ORAL_CAPSULE | Freq: Three times a day (TID) | ORAL | Status: DC
  Administered 2018-05-17 – 2018-05-21 (×13): 600 mg via ORAL
  Filled 2018-05-17 (×13): qty 2

## 2018-05-17 MED ORDER — AMITRIPTYLINE HCL 25 MG PO TABS
50.0000 mg | ORAL_TABLET | Freq: Every day | ORAL | Status: DC
  Administered 2018-05-17 – 2018-05-20 (×4): 50 mg via ORAL
  Filled 2018-05-17 (×4): qty 2

## 2018-05-17 MED ORDER — LIDOCAINE VISCOUS HCL 2 % MT SOLN
15.0000 mL | Freq: Once | OROMUCOSAL | Status: AC
  Administered 2018-05-17: 15 mL via ORAL
  Filled 2018-05-17: qty 15

## 2018-05-17 MED ORDER — POTASSIUM CHLORIDE CRYS ER 20 MEQ PO TBCR
40.0000 meq | EXTENDED_RELEASE_TABLET | Freq: Once | ORAL | Status: AC
  Administered 2018-05-17: 40 meq via ORAL
  Filled 2018-05-17: qty 2

## 2018-05-17 MED ORDER — ONDANSETRON HCL 4 MG/2ML IJ SOLN
4.0000 mg | Freq: Four times a day (QID) | INTRAMUSCULAR | Status: DC | PRN
  Administered 2018-05-17: 4 mg via INTRAVENOUS
  Filled 2018-05-17: qty 2

## 2018-05-17 MED ORDER — LORAZEPAM 2 MG/ML IJ SOLN
1.0000 mg | Freq: Four times a day (QID) | INTRAMUSCULAR | Status: AC | PRN
  Administered 2018-05-17 – 2018-05-20 (×9): 1 mg via INTRAVENOUS
  Filled 2018-05-17 (×9): qty 1

## 2018-05-17 NOTE — ED Notes (Addendum)
Spoke with pt father with pt permission. Told father that pt is being admitted to the hospital and gave the father the room number.

## 2018-05-17 NOTE — Clinical Social Work Note (Signed)
Clinical Social Work Assessment  Patient Details  Name: Kurt Horton MRN: [id number] Date of Birth: 06-24-1960  Date of referral:  05/17/18               Reason for consult:  Substance Use/ETOH Abuse                Permission sought to share information with:    Permission granted to share information::     Name::        Agency::     Relationship::     Contact Information:     Housing/Transportation Living arrangements for the past 2 months:  Single Family Home Source of Information:  Patient Patient Interpreter Needed:  None Criminal Activity/Legal Involvement Pertinent to Current Situation/Hospitalization:  No - Comment as needed Significant Relationships:  Parents Lives with:  Parents Do you feel safe going back to the place where you live?  Yes Need for family participation in patient care:  Yes (Comment)  Care giving concerns:  Patient lives in [place] with his mother and father.    Social Worker assessment / plan:  [place] representative (CSW) reviewed chart and noted that patient is admitted to [facility] for alcohol intoxication. CSW is familiar with patient from previous admissions. CSW met with patient alone at bedside to provide resources. Patient reported that he is living with his parents and he is drinking 20 air plane bottles of rum most days. Patient reported that he has been to several inpatient and outpatient rehabs for alcohol abuse however he is currently not in treatment. CSW provided patient with a list of outpatient substance abuse treatment options including [facility] and [place]. Per patient he lost his disability and medicaid and is trying to re-apply again. CSW printed medicaid application for patient at his request. CSW provided emotional support. Patient thanked CSW for visit and reported that is going back to his parents house after he leaves [facility]. CSW will continue to follow and assist as needed.    Employment status:  Disabled (Comment on whether or not currently  receiving Disability) Insurance information:  Self Pay (Medicaid Pending) PT Recommendations:  Not assessed at this time Information / Referral to community resources:  Outpatient Substance Abuse Treatment Options  Patient/Family's Response to care:  Patient accepted substance abuse resources.   Patient/Family's Understanding of and Emotional Response to Diagnosis, Current Treatment, and Prognosis:  Patient was very pleasant and thanked CSW for assistance.   Emotional Assessment Appearance:    Attitude/Demeanor/Rapport:    Affect (typically observed):  Accepting, Adaptable, Pleasant Orientation:  Oriented to Self, Oriented to Place, Oriented to  Time, Oriented to Situation Alcohol / Substance use:  Alcohol Use Psych involvement (Current and /or in the community):  No (Comment)  Discharge Needs  Concerns to be addressed:  Discharge Planning Concerns Readmission within the last 30 days:  Yes Current discharge risk:  Substance Abuse Barriers to Discharge:  Continued Medical Work up   [employer], Veronia Beets, LCSW 05/17/2018, 4:47 PM

## 2018-05-17 NOTE — ED Notes (Signed)
Patient transported to CT 

## 2018-05-17 NOTE — ED Notes (Signed)
Patient currently resting with eyes closed in bed. No distress observed respirations even and non labored.

## 2018-05-17 NOTE — ED Notes (Signed)
  Attempted to call report.  Will call back in 15 mins. 

## 2018-05-17 NOTE — ED Notes (Signed)
Patient returned from CT

## 2018-05-17 NOTE — Progress Notes (Signed)
[facility] - [place] at [facility]   PATIENT NAME: Kurt SimmerJohn Horton    MR#:  [id number]  DATE OF BIRTH:  07/20/1960  SUBJECTIVE:  Patient complaining of chronic back pain-[place], patient requesting oxycodone-medication is already on order, noted jitteriness/shakiness on evaluation, patient evaluated with nursing staff  REVIEW OF SYSTEMS:  CONSTITUTIONAL: No fever, fatigue or weakness.  EYES: No blurred or double vision.  EARS, NOSE, AND THROAT: No tinnitus or ear pain.  RESPIRATORY: No cough, shortness of breath, wheezing or hemoptysis.  CARDIOVASCULAR: No chest pain, orthopnea, edema.  GASTROINTESTINAL: No nausea, vomiting, diarrhea or abdominal pain.  GENITOURINARY: No dysuria, hematuria.  ENDOCRINE: No polyuria, nocturia,  HEMATOLOGY: No anemia, easy bruising or bleeding SKIN: No rash or lesion. MUSCULOSKELETAL: No joint pain or arthritis.   NEUROLOGIC: No tingling, numbness, weakness.  PSYCHIATRY: No anxiety or depression.   ROS  DRUG ALLERGIES:   Allergies  Allergen Reactions  . Nsaids Other (See Comments)    Duodenal ulcers, GI bleeding  . Other Other (See Comments)    Patient states he can't take any "mycin" medications because myasthenia gravis.    VITALS:  Blood pressure (!) 158/100, pulse 96, temperature 98.8 F (37.1 C), temperature source Oral, resp. rate 18, height 5\' 11"  (1.803 m), weight 82.6 kg, SpO2 99 %.  PHYSICAL EXAMINATION:  GENERAL:  58 y.o.-year-old patient lying in the bed with no acute distress.  EYES: Pupils equal, round, reactive to light and accommodation. No scleral icterus. Extraocular muscles intact.  HEENT: Head atraumatic, normocephalic. Oropharynx and nasopharynx clear.  NECK:  Supple, no jugular venous distention. No thyroid enlargement, no tenderness.  LUNGS: Normal breath sounds bilaterally, no wheezing, rales,rhonchi or crepitation. No use of accessory muscles of respiration.   CARDIOVASCULAR: S1, S2 normal. No murmurs, rubs, or gallops.  ABDOMEN: Soft, nontender, nondistended. Bowel sounds present. No organomegaly or mass.  EXTREMITIES: No pedal edema, cyanosis, or clubbing.  NEUROLOGIC: Cranial nerves II through XII are intact. Muscle strength 5/5 in all extremities. Sensation intact. Gait not checked.  PSYCHIATRIC: The patient is alert and oriented x 3.  SKIN: No obvious rash, lesion, or ulcer.   Physical Exam LABORATORY PANEL:   CBC Recent Labs  Lab 05/17/18 0622  WBC 10.0  HGB 10.9*  HCT 34.0*  PLT 263   ------------------------------------------------------------------------------------------------------------------  Chemistries  Recent Labs  Lab 05/16/18 1445 05/17/18 0622  NA 136 135  K 2.9* 3.7  CL 99 102  CO2 21* 25  GLUCOSE 215* 123*  BUN 14 16  CREATININE 1.00 0.76  CALCIUM 8.1* 8.0*  AST 50*  --   ALT 34  --   ALKPHOS 105  --   BILITOT 0.7  --    ------------------------------------------------------------------------------------------------------------------  Cardiac Enzymes No results for input(s): TROPONINI in the last 168 hours. ------------------------------------------------------------------------------------------------------------------  RADIOLOGY:  Ct Head Wo Contrast  Result Date: 05/17/2018 CLINICAL DATA:  ETOH intoxication, abrasions EXAM: CT HEAD WITHOUT CONTRAST TECHNIQUE: Contiguous axial images were obtained from the base of the skull through the vertex without intravenous contrast. COMPARISON:  CT 04/16/2018 FINDINGS: Brain: No acute territorial infarction, acute intracranial hemorrhage or mass is visualized. Resolving right convexity subdural hematoma, now 3 mm maximum thickness and more hypodense, consistent with developing chronic subdural. Atrophy. Mild small vessel ischemic changes of the white matter. No midline shift. Vascular: No hyperdense vessels.  No unexpected calcification Skull: No fracture  Sinuses/Orbits: No acute finding. Other: None IMPRESSION: 1. Resolving right convexity subdural hematoma, now  3 mm maximum thickness and more hypodense, consistent with developing chronic subdural. No acute intracranial hemorrhage is visualized. 2. Atrophy and mild small vessel ischemic changes of the white matter. Electronically Signed   By: Jasmine Pang M.D.   On: 05/17/2018 00:56    ASSESSMENT AND PLAN:  *Acute current alcohol intoxication, noted history of alcoholism  Stable  Continue alcohol withdrawal protocol   *Chronic paroxysmal atrial fibrillation currently with normal sinus rhythm Continue amiodarone, diltiazem, and metoprolol  *Acute hypokalemia Repleted  *Chronic diabetes mellitus type 2  Continue sliding scale insulin with Accu-Cheks per routine   *Recent history of subdural hematoma CT head is stable-no acute bleeding noted Continue to avoid anti-platelets/NSAIDs/anticoagulants  *Chronic combined systolic and diastolic heart failure Stable Continue beta-blocker therapy, lisinopril, restart Lasix  *Chronic depression Stable Continue Buspar and Cymbalta  *History of myasthenia gravis- no signs of acute exacerbation Continue CellCept  *Acute on chronic low back pain Followed by pain specialist in [facility]  Patient requesting fentanyl and oxycodone  Scheduled Tylenol, Flexeril as needed muscle spasms, Elavil at bedtime, continue Neurontin, oxycodone for severe pain  Note : Patient does not appear to have an indication for high-dose opioid medications-just degenerative joint disease   All the records are reviewed and case discussed with Care Management/Social Workerr. Management plans discussed with the patient, family and they are in agreement.  CODE STATUS: full  TOTAL TIME TAKING CARE OF THIS PATIENT: 35 minutes.     POSSIBLE D/C IN 1-3 DAYS, DEPENDING ON CLINICAL CONDITION.   Evelena Asa Jailynn Lavalais M.D on 05/17/2018   Between 7am to 6pm - Pager -  [phone]  After 6pm go to [email or web address] - password [email or web address]  [facility]  Office  [phone]  CC: Primary care physician; [name], MD  Note: This dictation was prepared with Dragon dictation along with smaller phrase technology. Any transcriptional errors that result from this process are unintentional.

## 2018-05-17 NOTE — Plan of Care (Signed)
  Problem: Health Behavior/Discharge Planning: Goal: Ability to manage health-related needs will improve Outcome: Not Progressing Note:  Patient w/ multiple requests for opioid pain medications and "stomach" related discomfort medications. Patient already on Q 4 hrs. PRN opioid pain medications now. CIWA scale continues to be +. IV Ativan administered. Will continue to closely monitor. Jari Favre [facility]

## 2018-05-18 LAB — GLUCOSE, CAPILLARY
GLUCOSE-CAPILLARY: 117 mg/dL — AB (ref 70–99)
Glucose-Capillary: 117 mg/dL — ABNORMAL HIGH (ref 70–99)
Glucose-Capillary: 123 mg/dL — ABNORMAL HIGH (ref 70–99)
Glucose-Capillary: 155 mg/dL — ABNORMAL HIGH (ref 70–99)

## 2018-05-18 MED ORDER — SODIUM CHLORIDE 0.9% FLUSH
3.0000 mL | Freq: Two times a day (BID) | INTRAVENOUS | Status: DC
  Administered 2018-05-18 – 2018-05-21 (×7): 3 mL via INTRAVENOUS

## 2018-05-18 MED ORDER — SODIUM CHLORIDE 0.9% FLUSH: 3.0000 mL | INTRAVENOUS | Status: DC | PRN

## 2018-05-18 NOTE — Progress Notes (Signed)
Confused.  He thinks he is in his bedroom at home.  Wants me to check in the kitchen for his cell phone.  When I told him he was at [facility] he responded with "oh, that's right".

## 2018-05-18 NOTE — Progress Notes (Signed)
[facility] - [place] at [facility]   PATIENT NAME: Kurt Horton    MR#:  [id number]  DATE OF BIRTH:  05/01/1961  SUBJECTIVE:   Continues to feel anxious and has tremors.  Chronic back pain is the same.  Uses oxycodone at home.  Afebrile.  REVIEW OF SYSTEMS:  CONSTITUTIONAL: Fatigue EYES: No blurred or double vision.  EARS, NOSE, AND THROAT: No tinnitus or ear pain.  RESPIRATORY: No cough, shortness of breath, wheezing or hemoptysis.  CARDIOVASCULAR: No chest pain, orthopnea, edema.  GASTROINTESTINAL: No nausea, vomiting, diarrhea or abdominal pain.  GENITOURINARY: No dysuria, hematuria.  ENDOCRINE: No polyuria, nocturia,  HEMATOLOGY: No anemia, easy bruising or bleeding SKIN: No rash or lesion. MUSCULOSKELETAL: Chronic back pain NEUROLOGIC: No tingling, numbness, weakness.  PSYCHIATRY: Anxiety  ROS  DRUG ALLERGIES:   Allergies  Allergen Reactions  . Nsaids Other (See Comments)    Duodenal ulcers, GI bleeding  . Other Other (See Comments)    Patient states he can't take any "mycin" medications because myasthenia gravis.    VITALS:  Blood pressure 129/81, pulse 79, temperature 98 F (36.7 C), temperature source Oral, resp. rate 19, height 5\' 11"  (1.803 m), weight 82.6 kg, SpO2 97 %.  PHYSICAL EXAMINATION:  GENERAL:  58 y.o.-year-old patient lying in the bed with no acute distress.  EYES: Pupils equal, round, reactive to light and accommodation. No scleral icterus. Extraocular muscles intact.  HEENT: Head atraumatic, normocephalic. Oropharynx and nasopharynx clear.  NECK:  Supple, no jugular venous distention. No thyroid enlargement, no tenderness.  LUNGS: Normal breath sounds bilaterally, no wheezing, rales,rhonchi or crepitation. No use of accessory muscles of respiration.  CARDIOVASCULAR: S1, S2 .  Tachycardia ABDOMEN: Soft, nontender, nondistended. Bowel sounds present. No organomegaly or mass.  EXTREMITIES: No pedal edema, cyanosis, or clubbing.   NEUROLOGIC: Cranial nerves II through XII are intact. Muscle strength 5/5 in all extremities. Sensation intact. Gait not checked.  PSYCHIATRIC: The patient is alert and oriented x 3.  Anxious SKIN: No obvious rash, lesion, or ulcer.   Physical Exam LABORATORY PANEL:   CBC Recent Labs  Lab 05/17/18 0622  WBC 10.0  HGB 10.9*  HCT 34.0*  PLT 263   ------------------------------------------------------------------------------------------------------------------  Chemistries  Recent Labs  Lab 05/16/18 1445 05/17/18 0622  NA 136 135  K 2.9* 3.7  CL 99 102  CO2 21* 25  GLUCOSE 215* 123*  BUN 14 16  CREATININE 1.00 0.76  CALCIUM 8.1* 8.0*  AST 50*  --   ALT 34  --   ALKPHOS 105  --   BILITOT 0.7  --    ------------------------------------------------------------------------------------------------------------------  Cardiac Enzymes No results for input(s): TROPONINI in the last 168 hours. ------------------------------------------------------------------------------------------------------------------  RADIOLOGY:  Ct Head Wo Contrast  Result Date: 05/17/2018 CLINICAL DATA:  ETOH intoxication, abrasions EXAM: CT HEAD WITHOUT CONTRAST TECHNIQUE: Contiguous axial images were obtained from the base of the skull through the vertex without intravenous contrast. COMPARISON:  CT 04/16/2018 FINDINGS: Brain: No acute territorial infarction, acute intracranial hemorrhage or mass is visualized. Resolving right convexity subdural hematoma, now 3 mm maximum thickness and more hypodense, consistent with developing chronic subdural. Atrophy. Mild small vessel ischemic changes of the white matter. No midline shift. Vascular: No hyperdense vessels.  No unexpected calcification Skull: No fracture Sinuses/Orbits: No acute finding. Other: None IMPRESSION: 1. Resolving right convexity subdural hematoma, now 3 mm maximum thickness and more hypodense, consistent with developing chronic subdural. No  acute intracranial hemorrhage is visualized. 2. Atrophy and mild  small vessel ischemic changes of the white matter. Electronically Signed   By: Jasmine PangKim  Fujinaga M.D.   On: 05/17/2018 00:56    ASSESSMENT AND PLAN:  *Alcohol withdrawals.  Long history of alcohol abuse.  Counseled to quit. Continue CIWA protocol. Telemetry monitoring. Likely discharge tomorrow.  *Chronic paroxysmal atrial fibrillation Currently with normal sinus rhythm Continue amiodarone, diltiazem, and metoprolol  *Acute hypokalemia Repleted  * Diabetes mellitus type 2  Continue sliding scale insulin with Accu-Cheks per routine   *Recent history of subdural hematoma CT head is stable-no acute bleeding noted Continue to avoid anti-platelets/NSAIDs/anticoagulants  *Chronic combined systolic and diastolic heart failure Continue beta-blocker therapy, lisinopril, restart Lasix  *Chronic depression Continue Buspar and Cymbalta  *History of myasthenia gravis- No signs of acute exacerbation Continue CellCept  * chronic low back pain [place]  Continue oxycodone from home.  All the records are reviewed and case discussed with Care Management/Social Workerr. Management plans discussed with the patient, family and they are in agreement.  CODE STATUS: Full  TOTAL TIME TAKING CARE OF THIS PATIENT: 35 minutes.   POSSIBLE D/C IN 1-2 DAYS, DEPENDING ON CLINICAL CONDITION.  Orie FishermanSrikar R Natia Fahmy M.D on 05/18/2018   Between 7am to 6pm - Pager - [phone]  After 6pm go to [email or web address] - password [email or web address]  [facility]  Office  [phone]  CC: Primary care physician; Barbette ReichmannHande, Vishwanath, MD  Note: This dictation was prepared with Dragon dictation along with smaller phrase technology. Any transcriptional errors that result from this process are unintentional.

## 2018-05-18 NOTE — Progress Notes (Signed)
Advance care planning  Purpose of Encounter Alcohol withdrawals  Parties in Attendance Patient  Patients Decisional capacity Alert and oriented.  Able to make medical decisions.  He has a documented power of attorney his brother.  Does not have a documented healthcare power of attorney.  Would like brother and his parents to make decisions together.  No advance directives in place.  Discussed regarding alcohol intoxication/withdrawal, treatment plan and prognosis.  CODE STATUS full code  Time spent - 17 minutes

## 2018-05-18 NOTE — Plan of Care (Signed)
Room and equipment set to minimize injury.  Mats on floor

## 2018-05-19 LAB — GLUCOSE, CAPILLARY
GLUCOSE-CAPILLARY: 100 mg/dL — AB (ref 70–99)
Glucose-Capillary: 117 mg/dL — ABNORMAL HIGH (ref 70–99)
Glucose-Capillary: 141 mg/dL — ABNORMAL HIGH (ref 70–99)
Glucose-Capillary: 101 mg/dL — ABNORMAL HIGH (ref 70–99)

## 2018-05-19 MED ORDER — CYCLOBENZAPRINE HCL 10 MG PO TABS
10.0000 mg | ORAL_TABLET | Freq: Three times a day (TID) | ORAL | Status: DC | PRN
  Administered 2018-05-19: 10 mg via ORAL
  Filled 2018-05-19: qty 1

## 2018-05-19 NOTE — Plan of Care (Signed)
  Problem: Health Behavior/Discharge Planning: Goal: Ability to manage health-related needs will improve Outcome: Not Progressing Note:  Patient still receiving IV Ativan Q 6 hours r/t elevated CIWA score, and he's still requesting PO Oxy Q 4 hours for uncontrolled chronic back pain. Still has visible hand tremors d/t alcohol withdrawal. Will continue to monitor progression. [facility]

## 2018-05-19 NOTE — Progress Notes (Signed)
[facility] - [place] at [facility]   PATIENT NAME: Kurt Horton    MR#:  [id number]  DATE OF BIRTH:  1960-06-06  SUBJECTIVE:   Hallucinations overnight. Sleeping a lot.  REVIEW OF SYSTEMS:  CONSTITUTIONAL: Fatigue EYES: No blurred or double vision.  EARS, NOSE, AND THROAT: No tinnitus or ear pain.  RESPIRATORY: No cough, shortness of breath, wheezing or hemoptysis.  CARDIOVASCULAR: No chest pain, orthopnea, edema.  GASTROINTESTINAL: No nausea, vomiting, diarrhea or abdominal pain.  GENITOURINARY: No dysuria, hematuria.  ENDOCRINE: No polyuria, nocturia,  HEMATOLOGY: No anemia, easy bruising or bleeding SKIN: No rash or lesion. MUSCULOSKELETAL: Chronic back pain NEUROLOGIC: No tingling, numbness, weakness.  PSYCHIATRY: Anxiety  ROS  DRUG ALLERGIES:   Allergies  Allergen Reactions  . Nsaids Other (See Comments)    Duodenal ulcers, GI bleeding  . Other Other (See Comments)    Patient states he can't take any "mycin" medications because myasthenia gravis.    VITALS:  Blood pressure 134/60, pulse 69, temperature (!) 97.5 F (36.4 C), temperature source Oral, resp. rate 19, height 5\' 11"  (1.803 m), weight 83.4 kg, SpO2 100 %.  PHYSICAL EXAMINATION:  GENERAL:  58 y.o.-year-old patient lying in the bed with no acute distress.  EYES: Pupils equal, round, reactive to light and accommodation. No scleral icterus. Extraocular muscles intact.  HEENT: Head atraumatic, normocephalic. Oropharynx and nasopharynx clear.  NECK:  Supple, no jugular venous distention. No thyroid enlargement, no tenderness.  LUNGS: Normal breath sounds bilaterally, no wheezing, rales,rhonchi or crepitation. No use of accessory muscles of respiration.  CARDIOVASCULAR: S1, S2 .  Tachycardia ABDOMEN: Soft, nontender, nondistended. Bowel sounds present. No organomegaly or mass.  EXTREMITIES: No pedal edema, cyanosis, or clubbing.  NEUROLOGIC: Cranial nerves II through XII are intact. Muscle  strength 5/5 in all extremities. Sensation intact. Gait not checked.  PSYCHIATRIC: The patient is alert and oriented x 3.  Anxious SKIN: No obvious rash, lesion, or ulcer.   Physical Exam LABORATORY PANEL:   CBC Recent Labs  Lab 05/17/18 0622  WBC 10.0  HGB 10.9*  HCT 34.0*  PLT 263   ------------------------------------------------------------------------------------------------------------------  Chemistries  Recent Labs  Lab 05/16/18 1445 05/17/18 0622  NA 136 135  K 2.9* 3.7  CL 99 102  CO2 21* 25  GLUCOSE 215* 123*  BUN 14 16  CREATININE 1.00 0.76  CALCIUM 8.1* 8.0*  AST 50*  --   ALT 34  --   ALKPHOS 105  --   BILITOT 0.7  --    ------------------------------------------------------------------------------------------------------------------  Cardiac Enzymes No results for input(s): TROPONINI in the last 168 hours. ------------------------------------------------------------------------------------------------------------------  RADIOLOGY:  No results found.  ASSESSMENT AND PLAN:  *Alcohol withdrawals.  Long history of alcohol abuse.  Counseled to quit. Continue CIWA protocol. Thiamine, Ativan PRN. Telemetry monitoring.  Continues to have hallucinations and tremors.  Will need at least 1 more day of inpatient treatment.  *Chronic paroxysmal atrial fibrillation Currently with normal sinus rhythm Continue amiodarone, diltiazem, and metoprolol  *Acute hypokalemia Repleted  * Diabetes mellitus type 2  Continue sliding scale insulin with Accu-Cheks per routine   *Recent history of subdural hematoma CT head is stable-no acute bleeding noted Continue to avoid anti-platelets/NSAIDs/anticoagulants  *Chronic combined systolic and diastolic heart failure Continue beta-blocker therapy, lisinopril, restart Lasix  *Chronic depression Continue Buspar and Cymbalta  *History of myasthenia gravis- No signs of acute exacerbation Continue CellCept  *  Chronic low back pain Followed by [facility] specialist in [facility]  Continue oxycodone from  home.  All the records are reviewed and case discussed with Care Management/Social Workerr. Management plans discussed with the patient, family and they are in agreement.  CODE STATUS: Full  TOTAL TIME TAKING CARE OF THIS PATIENT: 35 minutes.   POSSIBLE D/C IN 1-2 DAYS, DEPENDING ON CLINICAL CONDITION.  [name] M.D on 05/19/2018   Between 7am to 6pm - Pager - [phone]  After 6pm go to [email or web address] - password [email or web address]  [facility]  Office  [phone]  CC: Primary care physician; Barbette ReichmannHande, Vishwanath, MD  Note: This dictation was prepared with Dragon dictation along with smaller phrase technology. Any transcriptional errors that result from this process are unintentional.

## 2018-05-20 LAB — GLUCOSE, CAPILLARY
Glucose-Capillary: 95 mg/dL (ref 70–99)
Glucose-Capillary: 125 mg/dL — ABNORMAL HIGH (ref 70–99)
Glucose-Capillary: 144 mg/dL — ABNORMAL HIGH (ref 70–99)
Glucose-Capillary: 127 mg/dL — ABNORMAL HIGH (ref 70–99)

## 2018-05-20 MED ORDER — LORAZEPAM 2 MG/ML IJ SOLN: 0.5000 mg | Freq: Four times a day (QID) | INTRAMUSCULAR | Status: DC | PRN

## 2018-05-20 MED ORDER — LORAZEPAM 0.5 MG PO TABS: 0.5000 mg | ORAL_TABLET | Freq: Four times a day (QID) | ORAL | Status: DC | PRN

## 2018-05-20 NOTE — Evaluation (Signed)
Physical Therapy Evaluation Patient Details Name: Kurt Horton MRN: [id number] DOB: 05/30/1960 Today's Date: 05/20/2018   History of Present Illness  Pt is a 58 y.o. male presenting to hospital 05/16/18 with alcohol intoxication/withdrawal; noted to have paroxysmal a-fib.  Pt also with hallucinations and tremors during hospitalization. CT of head showing resolving R convexity SDH.  PMH includes L toe amp, alcoholic abuse, DM, a-fib, htn, myasthenia gravis, CHF, a-fib with RVR, R hip and R shoulder surgery, chronic back pain.  Clinical Impression  Prior to hospital admission, pt was ambulatory with [facility].  Pt lives with his parents in 1 level home (level entry).  Currently pt is SBA with bed mobility and CGA to min assist (with 2nd assist for safety) to stand and march in place with use of walker.  Pt fatigued quickly with standing activities and requesting to lay down to rest.  Mild generalized tremor noted.  Pt standing with weight through his heels B causing posterior lean standing requiring assist to shift weight forward and prevent loss of balance backwards.  Pt would benefit from skilled PT to address noted impairments and functional limitations (see below for any additional details).  Upon hospital discharge, pt would benefit from STR.    Follow Up Recommendations SNF    Equipment Recommendations  Rolling walker with 5" wheels    Recommendations for Other Services       Precautions / Restrictions Precautions Precautions: Fall Restrictions Weight Bearing Restrictions: No      Mobility  Bed Mobility Overal bed mobility: Needs Assistance Bed Mobility: Supine to Sit;Sit to Supine     Supine to sit: Supervision;HOB elevated Sit to supine: Supervision;HOB elevated   General bed mobility comments: mild increased effort to perform on own  Transfers Overall transfer level: Needs assistance Equipment used: Rolling walker (2 wheeled) Transfers: Sit to/from Stand Sit to Stand: Min  assist;Min guard;+2 safety/equipment         General transfer comment: vc's for scooting to edge of bed, LE and UE placement, and to shift weight forward when standing; pt standing with posterior weight through B heels requiring min assist to shift weight forward (x3 trials)  Ambulation/Gait Ambulation/Gait assistance: Min guard;Min assist;+2 safety/equipment Gait Distance (Feet): (marched in place x10 reps B LE's) Assistive device: Rolling walker (2 wheeled)   Gait velocity: decreased   General Gait Details: occasional min assist to shift weight forward d/t posterior lean; vc's to increase B LE foot clearance  Stairs            Wheelchair Mobility    Modified Rankin (Stroke Patients Only)       Balance Overall balance assessment: Needs assistance Sitting-balance support: No upper extremity supported;Feet supported Sitting balance-Leahy Scale: Good Sitting balance - Comments: steady sitting reaching within BOS   Standing balance support: Bilateral upper extremity supported Standing balance-Leahy Scale: Poor Standing balance comment: pt requiring B UE support for static standing balance                             Pertinent Vitals/Pain Pain Assessment: 0-10 Pain Score: 9  Pain Location: chronic low back pain Pain Descriptors / Indicators: Aching;Sore Pain Intervention(s): Limited activity within patient's tolerance;Monitored during session;Repositioned;Patient requesting pain meds-RN notified  Vitals (HR and O2 on room air) stable and WFL throughout treatment session.    Home Living Family/patient expects to be discharged to:: Private residence Living Arrangements: Parent Available Help at Discharge: Family;Available 24 hours/day  Type of Home: House Home Access: Level entry     Home Layout: One level Home Equipment: Walker - 2 wheels;Cane - single point      Prior Function Level of Independence: Independent with assistive device(s)          Comments: Modified independent ambulating with SPC (occasional use of RW).  2 falls in last month.     Hand Dominance   Dominant Hand: Right    Extremity/Trunk Assessment   Upper Extremity Assessment Upper Extremity Assessment: Generalized weakness    Lower Extremity Assessment Lower Extremity Assessment: Generalized weakness    Cervical / Trunk Assessment Cervical / Trunk Assessment: Normal  Communication   Communication: No difficulties  Cognition Arousal/Alertness: Awake/alert Behavior During Therapy: WFL for tasks assessed/performed Overall Cognitive Status: Within Functional Limits for tasks assessed                                        General Comments   Nursing cleared pt for participation in physical therapy.  Pt agreeable to PT session.    Exercises  transfer training   Assessment/Plan    PT Assessment Patient needs continued PT services  PT Problem List Decreased strength;Decreased activity tolerance;Decreased balance;Decreased mobility;Decreased knowledge of use of DME;Decreased safety awareness;Decreased knowledge of precautions;Pain       PT Treatment Interventions DME instruction;Gait training;Functional mobility training;Therapeutic activities;Therapeutic exercise;Balance training;Patient/family education    PT Goals (Current goals can be found in the Care Plan section)  Acute Rehab PT Goals Patient Stated Goal: to improve strength and mobility PT Goal Formulation: With patient Time For Goal Achievement: 06/03/18 Potential to Achieve Goals: Good    Frequency Min 2X/week   Barriers to discharge Decreased caregiver support      Co-evaluation               AM-PAC PT "6 Clicks" Mobility  Outcome Measure Help needed turning from your back to your side while in a flat bed without using bedrails?: A Little Help needed moving from lying on your back to sitting on the side of a flat bed without using bedrails?: A  Little Help needed moving to and from a bed to a chair (including a wheelchair)?: A Little Help needed standing up from a chair using your arms (e.g., wheelchair or bedside chair)?: A Little Help needed to walk in hospital room?: A Lot Help needed climbing 3-5 steps with a railing? : A Lot 6 Click Score: 16    End of Session Equipment Utilized During Treatment: Gait belt Activity Tolerance: Patient limited by fatigue Patient left: in bed;with call bell/phone within reach;with bed alarm set;Other (comment)(bed in lowest position; fall mat in place) Nurse Communication: Mobility status;Precautions;Patient requests pain meds PT Visit Diagnosis: Other abnormalities of gait and mobility (R26.89);Muscle weakness (generalized) (M62.81);History of falling (Z91.81);Difficulty in walking, not elsewhere classified (R26.2)    Time: 1950-9326 PT Time Calculation (min) (ACUTE ONLY): 26 min   Charges:   PT Evaluation $PT Eval Low Complexity: 1 Low PT Treatments $Therapeutic Activity: 8-22 mins       Hendricks Limes, PT 05/20/18, 2:43 PM [phone]

## 2018-05-20 NOTE — Clinical Social Work Note (Signed)
CSW found in chart review that PT is recommending SNF for patient. Patient currently has no way to pay for SNF as he has lost his medicaid and disability recently. Patient also states he is unable to pay for SNF out of pocket. Patient will return home with parents.  Ruthe Mannan MSW, [address] [phone]

## 2018-05-20 NOTE — Progress Notes (Signed)
[facility] [place] [facility]   PATIENT NAME: Kurt Horton    MR#:  [id number]  DATE OF BIRTH:  1960-07-19  SUBJECTIVE:   Hallucinations continue but better. Tremulous.  REVIEW OF SYSTEMS:  CONSTITUTIONAL: Fatigue EYES: No blurred or double vision.  EARS, NOSE, AND THROAT: No tinnitus or ear pain.  RESPIRATORY: No cough, shortness of breath, wheezing or hemoptysis.  CARDIOVASCULAR: No chest pain, orthopnea, edema.  GASTROINTESTINAL: No nausea, vomiting, diarrhea or abdominal pain.  GENITOURINARY: No dysuria, hematuria.  ENDOCRINE: No polyuria, nocturia,  HEMATOLOGY: No anemia, easy bruising or bleeding SKIN: No rash or lesion. MUSCULOSKELETAL: Chronic back pain NEUROLOGIC: No tingling, numbness, weakness.  PSYCHIATRY: Anxiety  ROS  DRUG ALLERGIES:   Allergies  Allergen Reactions  . Nsaids Other (See Comments)    Duodenal ulcers, GI bleeding  . Other Other (See Comments)    Patient states he can't take any "mycin" medications because myasthenia gravis.    VITALS:  Blood pressure 130/89, pulse 78, temperature 98.2 F (36.8 C), temperature source Oral, resp. rate 18, height 5\' 11"  (1.803 m), weight 83 kg, SpO2 100 %.  PHYSICAL EXAMINATION:  GENERAL:  58 y.o.-year-old patient lying in the bed with no acute distress.  EYES: Pupils equal, round, reactive to light and accommodation. No scleral icterus. Extraocular muscles intact.  HEENT: Head atraumatic, normocephalic. Oropharynx and nasopharynx clear.  NECK:  Supple, no jugular venous distention. No thyroid enlargement, no tenderness.  LUNGS: Normal breath sounds bilaterally, no wheezing, rales,rhonchi or crepitation. No use of accessory muscles of respiration.  CARDIOVASCULAR: S1, S2 .  Tachycardia ABDOMEN: Soft, nontender, nondistended. Bowel sounds present. No organomegaly or mass.  EXTREMITIES: No pedal edema, cyanosis, or clubbing.  NEUROLOGIC: Cranial nerves II through XII are intact. Muscle  strength 5/5 in all extremities. Sensation intact. Gait not checked.  PSYCHIATRIC: The patient is alert and oriented x 3.  Anxious SKIN: No obvious rash, lesion, or ulcer.   Physical Exam LABORATORY PANEL:   CBC Recent Labs  Lab 05/17/18 0622  WBC 10.0  HGB 10.9*  HCT 34.0*  PLT 263   ------------------------------------------------------------------------------------------------------------------  Chemistries  Recent Labs  Lab 05/16/18 1445 05/17/18 0622  NA 136 135  K 2.9* 3.7  CL 99 102  CO2 21* 25  GLUCOSE 215* 123*  BUN 14 16  CREATININE 1.00 0.76  CALCIUM 8.1* 8.0*  AST 50*  --   ALT 34  --   ALKPHOS 105  --   BILITOT 0.7  --    ------------------------------------------------------------------------------------------------------------------  Cardiac Enzymes No results for input(s): TROPONINI in the last 168 hours. ------------------------------------------------------------------------------------------------------------------  RADIOLOGY:  No results found.  ASSESSMENT AND PLAN:  *Alcohol withdrawals.  Long history of alcohol abuse.  Counseled to quit. Continue CIWA protocol with ativan PRN Thiamine, Ativan PRN. Telemetry monitoring.  Continues to have hallucinations and tremors.  Likely d/c tomorrow  *Chronic paroxysmal atrial fibrillation Currently with normal sinus rhythm Continue amiodarone, diltiazem, and metoprolol  *Acute hypokalemia Repleted  * Diabetes mellitus type 2  Continue sliding scale insulin with Accu-Cheks per routine   *Recent history of subdural hematoma CT head is stable-no acute bleeding noted Continue to avoid anti-platelets/NSAIDs/anticoagulants  *Chronic combined systolic and diastolic heart failure Continue beta-blocker therapy, lisinopril, restart Lasix  *Chronic depression Continue Buspar and Cymbalta  *History of myasthenia gravis- No signs of acute exacerbation Continue CellCept  * Chronic low back  pain [facility]  Continue oxycodone from home.  All the records  are reviewed and case discussed with Care Management/Social Workerr. Management plans discussed with the patient, family and they are in agreement.  CODE STATUS: Full  TOTAL TIME TAKING CARE OF THIS PATIENT: 35 minutes.   POSSIBLE D/C IN 1-2 DAYS, DEPENDING ON CLINICAL CONDITION.  Orie Fisherman M.D on 05/20/2018   Between 7am to 6pm - Pager - [phone]  After 6pm go to [email or web address] - password [employer]  [facility]  Office  [phone]  CC: Primary care physician; [name], MD  Note: This dictation was prepared with Dragon dictation along with smaller phrase technology. Any transcriptional errors that result from this process are unintentional.

## 2018-05-21 LAB — GLUCOSE, CAPILLARY
Glucose-Capillary: 118 mg/dL — ABNORMAL HIGH (ref 70–99)
Glucose-Capillary: 113 mg/dL — ABNORMAL HIGH (ref 70–99)

## 2018-05-21 MED ORDER — OXYCODONE HCL 5 MG PO TABS: 5.0000 mg | ORAL_TABLET | Freq: Four times a day (QID) | ORAL | 0 refills | Status: DC | PRN

## 2018-05-21 NOTE — Progress Notes (Signed)
Patient given discharge instructions with prescription. IV taken out and tele monitor. Patient's pastor driving patient home in stable condition.

## 2018-05-21 NOTE — Plan of Care (Signed)
  Problem: Education: Goal: Knowledge of General Education information will improve Description Including pain rating scale, medication(s)/side effects and non-pharmacologic comfort measures Outcome: Adequate for Discharge   Problem: Health Behavior/Discharge Planning: Goal: Ability to manage health-related needs will improve Outcome: Adequate for Discharge   Problem: Coping: Goal: Level of anxiety will decrease Outcome: Adequate for Discharge   Problem: Safety: Goal: Ability to remain free from injury will improve Outcome: Adequate for Discharge   Problem: Skin Integrity: Goal: Risk for impaired skin integrity will decrease Outcome: Adequate for Discharge

## 2018-05-21 NOTE — Clinical Social Work Note (Signed)
Patient is returning back home with his father, CSW signing off.  Kurt Horton. Kurt Horton, MSW, Theresia Majors [phone]  05/21/2018 12:15 PM

## 2018-05-21 NOTE — Plan of Care (Signed)
?  Problem: Health Behavior/Discharge Planning: ?Goal: Ability to manage health-related needs will improve ?Outcome: Progressing ?  ?Problem: Coping: ?Goal: Level of anxiety will decrease ?Outcome: Progressing ?  ?Problem: Safety: ?Goal: Ability to remain free from injury will improve ?Outcome: Progressing ?  ?

## 2018-05-21 NOTE — Discharge Instructions (Signed)
Resume diet and activity as before ° °QUIT ALCOHOL °

## 2018-05-23 ENCOUNTER — Other Ambulatory Visit: Payer: Self-pay

## 2018-05-23 ENCOUNTER — Encounter: Payer: Self-pay | Admitting: Emergency Medicine

## 2018-05-23 ENCOUNTER — Emergency Department
Admission: EM | Admit: 2018-05-23 | Discharge: 2018-05-23 | Disposition: A | Discharge status: Home / Self Care | Payer: Self-pay | Attending: Emergency Medicine | Admitting: Emergency Medicine

## 2018-05-23 DIAGNOSIS — F10220 Alcohol dependence with intoxication, uncomplicated: Secondary | ICD-10-CM | POA: Insufficient documentation

## 2018-05-23 DIAGNOSIS — I110 Hypertensive heart disease with heart failure: Secondary | ICD-10-CM | POA: Insufficient documentation

## 2018-05-23 DIAGNOSIS — E119 Type 2 diabetes mellitus without complications: Secondary | ICD-10-CM | POA: Insufficient documentation

## 2018-05-23 DIAGNOSIS — I5042 Chronic combined systolic (congestive) and diastolic (congestive) heart failure: Secondary | ICD-10-CM | POA: Insufficient documentation

## 2018-05-23 DIAGNOSIS — Z79899 Other long term (current) drug therapy: Secondary | ICD-10-CM | POA: Insufficient documentation

## 2018-05-23 DIAGNOSIS — Z7984 Long term (current) use of oral hypoglycemic drugs: Secondary | ICD-10-CM | POA: Insufficient documentation

## 2018-05-23 DIAGNOSIS — F17210 Nicotine dependence, cigarettes, uncomplicated: Secondary | ICD-10-CM | POA: Insufficient documentation

## 2018-05-23 LAB — COMPREHENSIVE METABOLIC PANEL
ALT: 21 U/L (ref 0–44)
AST: 21 U/L (ref 15–41)
Albumin: 3.8 g/dL (ref 3.5–5.0)
Alkaline Phosphatase: 91 U/L (ref 38–126)
Anion gap: 13 (ref 5–15)
BUN: 7 mg/dL (ref 6–20)
CO2: 24 mmol/L (ref 22–32)
Calcium: 8.8 mg/dL — ABNORMAL LOW (ref 8.9–10.3)
Chloride: 100 mmol/L (ref 98–111)
Creatinine, Ser: 0.82 mg/dL (ref 0.61–1.24)
GFR calc Af Amer: >60 mL/min (ref >60)
Glucose, Bld: 154 mg/dL — ABNORMAL HIGH (ref 70–99)
Potassium: 3.2 mmol/L — ABNORMAL LOW (ref 3.5–5.1)
Sodium: 137 mmol/L (ref 135–145)
Total Bilirubin: 0.6 mg/dL (ref 0.3–1.2)
Total Protein: 7 g/dL (ref 6.5–8.1)

## 2018-05-23 LAB — CBC WITH DIFFERENTIAL/PLATELET
Abs Immature Granulocytes: 0.04 10*3/uL (ref 0.00–0.07)
Basophils Absolute: 0.1 10*3/uL (ref 0.0–0.1)
Basophils Relative: 1 %
Eosinophils Absolute: 0.1 10*3/uL (ref 0.0–0.5)
Eosinophils Relative: 1 %
HEMATOCRIT: 43.1 % (ref 39.0–52.0)
Hemoglobin: 13.6 g/dL (ref 13.0–17.0)
Immature Granulocytes: 1 %
Lymphocytes Relative: 45 %
Lymphs Abs: 2.7 10*3/uL (ref 0.7–4.0)
MCH: 26.3 pg (ref 26.0–34.0)
MCHC: 31.6 g/dL (ref 30.0–36.0)
MCV: 83.2 fL (ref 80.0–100.0)
MONOS PCT: 10 %
Monocytes Absolute: 0.6 10*3/uL (ref 0.1–1.0)
Neutro Abs: 2.5 10*3/uL (ref 1.7–7.7)
Neutrophils Relative %: 42 %
Platelets: 242 10*3/uL (ref 150–400)
RBC: 5.18 MIL/uL (ref 4.22–5.81)
RDW: 21.2 % — AB (ref 11.5–15.5)
Smear Review: NORMAL
WBC: 5.9 10*3/uL (ref 4.0–10.5)
nRBC: 0 % (ref 0.0–0.2)

## 2018-05-23 LAB — ETHANOL: Alcohol, Ethyl (B): 392 mg/dL (ref <10)

## 2018-05-23 MED ORDER — SODIUM CHLORIDE 0.9 % IV BOLUS
1000.0000 mL | Freq: Once | INTRAVENOUS | Status: AC
  Administered 2018-05-23: 1000 mL via INTRAVENOUS

## 2018-05-23 MED ORDER — CHLORDIAZEPOXIDE HCL 25 MG PO CAPS
50.0000 mg | ORAL_CAPSULE | Freq: Once | ORAL | Status: AC
  Administered 2018-05-23: 50 mg via ORAL
  Filled 2018-05-23: qty 2

## 2018-05-23 MED ORDER — ACETAMINOPHEN 325 MG PO TABS
650.0000 mg | ORAL_TABLET | Freq: Once | ORAL | Status: AC
  Administered 2018-05-23: 650 mg via ORAL
  Filled 2018-05-23: qty 2

## 2018-05-23 MED ORDER — THIAMINE HCL 100 MG/ML IJ SOLN
100.0000 mg | Freq: Once | INTRAMUSCULAR | Status: AC
  Administered 2018-05-23: 100 mg via INTRAVENOUS
  Filled 2018-05-23: qty 2

## 2018-05-23 NOTE — ED Triage Notes (Signed)
Pt to ED via [facility]. Per [facility] pt called 911 to get help to detox. Per EMS pt was found intoxicated. Upon arrival EDP at bedside; pt states he drank 10 small bottles of bodka; pt also states he fell at home. VSS. NAD noted.

## 2018-05-23 NOTE — ED Notes (Signed)
Attempted to call pt's father x2 to arrange for safe transportation following possible upcoming d/c (Father listed as Emergency Contact in pt's demographics). No answer at number provided [phone]), but was able to leave a callback number on VM to the ED.

## 2018-05-23 NOTE — ED Notes (Signed)
Hold Chlordiazepoxide (librium) due to pt being highly intoxicated. Pt is resting at this time. NAD noted. VSS. This RN will continue to monitor pt.

## 2018-05-23 NOTE — ED Provider Notes (Addendum)
[facility] Emergency Department Provider Note  ____________________________________________  Time seen: Approximately 4:13 PM  I have reviewed the triage vital signs and the nursing notes.   HISTORY  Chief Complaint Alcohol Intoxication   HPI Kurt Horton is a 58 y.o. male history of alcohol abuse complicated by DTs, atrial fibrillation, hypertension, diabetes who presents for evaluation of intoxication.  Patient reports drinking 10 airplane bottles of rum and vodka today and then calling 911 for help detoxing. Patient discharged 2 days ago after 6 day admission for detox in the ICU for complicated withdrawals. Patient is crying and asking us to save his life. Not actively withdrawing. No SI or HI. No CP, SOB, abdominal pain, HA.    Past Medical History:  Diagnosis Date  . A-fib (HCC)   . Alcohol abuse   . Alcohol withdrawal (HCC) 11/12/2016  . Diabetes mellitus without complication (HCC)   . DJD (degenerative joint disease)   . Hypertension   . Myasthenia gravis (HCC)   . Renal disorder     Patient Active Problem List   Diagnosis Date Noted  . GI bleed 02/04/2018  . Alcohol intoxication (HCC) 12/22/2017  . Malnutrition of moderate degree 10/04/2017  . Chronic combined systolic and diastolic CHF (congestive heart failure) (HCC) 08/13/2017  . Alcohol withdrawal delirium (HCC) 02/26/2017  . Atrial fibrillation with RVR (HCC) 02/25/2017  . Acute alcoholic intoxication with complication (HCC)   . Alcohol abuse   . Alcohol withdrawal (HCC) 11/13/2016  . Substance induced mood disorder (HCC) 11/13/2016  . SVT (supraventricular tachycardia) (HCC) 11/12/2016  . HTN (hypertension) 11/12/2016  . EtOH dependence (HCC) 11/12/2016  . Lumbar radiculopathy 08/24/2016  . Depression with anxiety 08/24/2016  . Chronic low back pain 01/04/2016  . Pressure ulcer 12/29/2015  . Osteomyelitis (HCC) 12/26/2015  . PVD (peripheral vascular disease) (HCC)  12/26/2015  . Type II diabetes mellitus with manifestations (HCC) 12/26/2015  . Myasthenia gravis (HCC) 12/26/2015    Past Surgical History:  Procedure Laterality Date  . AMPUTATION TOE Left 12/29/2015   Procedure: AMPUTATION TOE;  Surgeon: Gwyneth RevelsJustin Fowler, DPM;  Location: [facility] ORS;  Service: Podiatry;  Laterality: Left;  . ESOPHAGOGASTRODUODENOSCOPY N/A 02/05/2018   Procedure: ESOPHAGOGASTRODUODENOSCOPY (EGD);  Surgeon: Pasty Spillersahiliani, Varnita B, MD;  [facility];  Service: Endoscopy;  Laterality: N/A;  . PERIPHERAL VASCULAR CATHETERIZATION N/A 12/27/2015   Procedure: Lower Extremity Angiography;  Surgeon: Annice NeedyJason S Dew, MD;  Location: [facility] INVASIVE CV LAB;  Service: Cardiovascular;  Laterality: N/A;  . right hip surgery    . right shoulder surgery      Prior to Admission medications   Medication Sig Start Date End Date Taking? Authorizing Provider  B Complex-C (B-COMPLEX WITH VITAMIN C) tablet Take 1 tablet by mouth daily. 11/02/17   Alford HighlandWieting, Richard, MD  busPIRone (BUSPAR) 15 MG tablet Take 1 tablet (15 mg total) by mouth 2 (two) times daily. 04/10/18   Ramonita LabGouru, Aruna, MD  DULoxetine (CYMBALTA) 30 MG capsule Take 30 mg by mouth daily at 6 (six) AM. 03/20/18 03/20/19  [provider]  gabapentin (NEURONTIN) 300 MG capsule Take 2 capsules (600 mg total) by mouth 3 (three) times daily. 11/02/17   Alford HighlandWieting, Richard, MD  metFORMIN (GLUCOPHAGE) 500 MG tablet Take 1 tablet (500 mg total) by mouth daily. 11/02/17   Alford HighlandWieting, Richard, MD  metoprolol succinate (TOPROL-XL) 50 MG 24 hr tablet Take 1 tablet (50 mg total) by mouth daily. Take with or immediately following a meal. 11/02/17  Alford Highland, MD  multivitamin (ONE-A-DAY MEN'S) TABS tablet Take 1 tablet by mouth daily. 04/19/18   Shaune Pollack, MD  mycophenolate (CELLCEPT) 500 MG tablet Take 1 tablet (500 mg total) by mouth 2 (two) times daily. Take 1 tablet (500MG ) by mouth every morning and 2 tablets (1000MG ) by mouth every  evening Patient taking differently: Take 500-1,000 mg by mouth See admin instructions. 500 mg every morning and 1000 mg at bedtime 11/02/17   Alford Highland, MD  oxyCODONE (OXY IR/ROXICODONE) 5 MG immediate release tablet Take 1 tablet (5 mg total) by mouth every 6 (six) hours as needed for severe pain. 05/21/18   Milagros Loll, MD  senna-docusate (SENOKOT-S) 8.6-50 MG tablet Take 1 tablet by mouth at bedtime as needed for mild constipation. Patient taking differently: Take 1 tablet by mouth daily.  12/25/17   Mayo, Allyn Kenner, MD  testosterone cypionate (DEPOTESTOSTERONE CYPIONATE) 200 MG/ML injection Inject 200 mg into the muscle every 14 (fourteen) days.    [provider]  thiamine 100 MG tablet Take 1 tablet (100 mg total) by mouth daily. 04/19/18   Shaune Pollack, MD    Allergies Nsaids and Other  Family History  Problem Relation Age of Onset  . Rheum arthritis Father     Social History Social History   Tobacco Use  . Smoking status: Current Every Day Smoker    Packs/day: 0.50    Types: Cigarettes  . Smokeless tobacco: Never Used  Substance Use Topics  . Alcohol use: Yes    Comment: Pt had 20 airplane bottles today  . Drug use: No    Review of Systems  Constitutional: Negative for fever. Eyes: Negative for visual changes. ENT: Negative for sore throat. Neck: No neck pain  Cardiovascular: Negative for chest pain. Respiratory: Negative for shortness of breath. Gastrointestinal: Negative for abdominal pain, vomiting or diarrhea. Genitourinary: Negative for dysuria. Musculoskeletal: Negative for back pain. Skin: Negative for rash. Neurological: Negative for headaches, weakness or numbness. Psych: No SI or HI  ____________________________________________   PHYSICAL EXAM:  VITAL SIGNS: Vitals:   05/23/18 1915 05/23/18 1930  BP:  (!) 150/74  Pulse: 91 89  Resp: 11 19  Temp:    SpO2: 100% 98%   Constitutional: Alert and oriented, intoxicated.  HEENT:       Head: Normocephalic and atraumatic.         Eyes: Conjunctivae are normal. Sclera is non-icteric.       Mouth/Throat: Mucous membranes are moist.       Neck: Supple with no signs of meningismus. Cardiovascular: Regular rate and rhythm. No murmurs, gallops, or rubs. 2+ symmetrical distal pulses are present in all extremities. No JVD. Respiratory: Normal respiratory effort. Lungs are clear to auscultation bilaterally. No wheezes, crackles, or rhonchi.  Gastrointestinal: Soft, non tender, and non distended with positive bowel sounds. No rebound or guarding. Musculoskeletal: Nontender with normal range of motion in all extremities. No edema, cyanosis, or erythema of extremities. Neurologic: Normal speech and language. Face is symmetric. Moving all extremities. No gross focal neurologic deficits are appreciated. Skin: Skin is warm, dry and intact. No rash noted. Psychiatric: Mood and affect are normal. Speech and behavior are normal.  ____________________________________________   LABS (all labs ordered are listed, but only abnormal results are displayed)  Labs Reviewed  CBC WITH DIFFERENTIAL/PLATELET - Abnormal; Notable for the following components:      Result Value   RDW 21.2 (*)    All other components within normal limits  COMPREHENSIVE  METABOLIC PANEL - Abnormal; Notable for the following components:   Potassium 3.2 (*)    Glucose, Bld 154 (*)    Calcium 8.8 (*)    All other components within normal limits  ETHANOL - Abnormal; Notable for the following components:   Alcohol, Ethyl (B) 392 (*)    All other components within normal limits   ____________________________________________  EKG  ED ECG REPORT I, Nita Sickle, the attending physician, personally viewed and interpreted this ECG.  Normal sinus rhythm, rate of 87, normal intervals, LAFB, left axis deviation, no ST elevations or depressions.  Unchanged from  prior. ____________________________________________  RADIOLOGY  none  ____________________________________________   PROCEDURES  Procedure(s) performed: None Procedures Critical Care performed:  None ____________________________________________   INITIAL IMPRESSION / ASSESSMENT AND PLAN / ED COURSE  58 y.o. male history of alcohol abuse complicated by DTs, atrial fibrillation, hypertension, diabetes who presents for evaluation of intoxication.  Patient has been admitted to the hospital several times for complicated withdrawals.  He receives detox and gets discharged home in stable condition and immediately starts drinking again.  He was discharged from [place] 2 days ago and is back today after drinking 10 airplane bottles of liquor just prior to arrival.  Patient denies suicidal homicidal ideation.  Patient obviously does not wish detox as every single time he is discharged he goes immediately back to drinking.  Therefore will check basic labs to rule out alcoholic ketoacidosis, severe electrolyte abnormalities, severe dehydration and plan to discharge patient home before any signs of withdrawal.  Patient is awake and alert.   Clinical Course as of May 23 1998  Thu May 23, 2018  1958 Patient has been sleeping, normal end tidal CO2. Labs with no acute findings. Alcohol level of 392. Several attempts to reach his family unsuccessful. Patient has no sober ride for home. Needs a ride or to be clinically sober for dc. No active signs of withdrawal at this time. Care transferred to Dr. Lenard Lance.    [CV]    Clinical Course User Index [CV] Don Perking [place], MD   _________________________ 8:43 PM on 05/23/2018 -----------------------------------------  Patient awake, walking, no signs of withdrawal yet. Given librium. Patient is clinically sober, walking and tolerating PO. No request for detox at this time. Will provide a cab voucher for home.  As part of my medical decision making, I  reviewed the following data within the electronic MEDICAL RECORD NUMBER Nursing notes reviewed and incorporated, Labs reviewed , EKG interpreted , Old EKG reviewed, Old chart reviewed, Notes from prior ED visits and [place] Controlled Substance Database    Pertinent labs & imaging results that were available during my care of the patient were reviewed by me and considered in my medical decision making (see chart for details).    ____________________________________________   FINAL CLINICAL IMPRESSION(S) / ED DIAGNOSES  Final diagnoses:  Alcohol dependence with uncomplicated intoxication (HCC)      NEW MEDICATIONS STARTED DURING THIS VISIT:  ED Discharge Orders    None       Note:  This document was prepared using Dragon voice recognition software and may include unintentional dictation errors.    Don Perking [place], MD 05/23/18 Linton Flemings, [place], MD 05/23/18 [phone]

## 2018-05-23 NOTE — ED Notes (Signed)
Date and time results received: 05/23/18 5:19 PM  (use smartphrase ".now" to insert current time)  Test: ETOH Critical Value: 392  Name of Provider Notified: Don Perking   Orders Received? Or Actions Taken?: critical results reported and acknowledged.

## 2018-05-23 NOTE — ED Notes (Signed)
Patient resting in bed.

## 2018-05-23 NOTE — ED Notes (Signed)
Attempted to call pt's father to arrange for safe transportation following possible upcoming d/c (Father listed as Emergency Contact in pt's demographics). No answer at number provided [phone]), but was able to leave a callback number on VM to the ED.

## 2018-05-23 NOTE — ED Notes (Signed)
Bobbyjoe Leetch (father) had been contacted via telephone. No answer. This RN left a voicemail compliant with HIPAA regulations.

## 2018-05-25 ENCOUNTER — Emergency Department
Admission: EM | Admit: 2018-05-25 | Discharge: 2018-05-25 | Disposition: A | Discharge status: Other Acute Inpatient Hospital | Payer: Self-pay | Attending: Emergency Medicine | Admitting: Emergency Medicine

## 2018-05-25 ENCOUNTER — Encounter: Payer: Self-pay | Admitting: *Deleted

## 2018-05-25 ENCOUNTER — Inpatient Hospital Stay
Admission: AD | Admit: 2018-05-25 | Discharge: 2018-05-30 | DRG: 885 | Disposition: A | Discharge status: Home / Self Care | Payer: No Typology Code available for payment source | Attending: Psychiatry | Admitting: Psychiatry

## 2018-05-25 ENCOUNTER — Encounter: Payer: Self-pay | Admitting: Emergency Medicine

## 2018-05-25 ENCOUNTER — Other Ambulatory Visit: Payer: Self-pay

## 2018-05-25 DIAGNOSIS — E119 Type 2 diabetes mellitus without complications: Secondary | ICD-10-CM | POA: Diagnosis present

## 2018-05-25 DIAGNOSIS — Z7984 Long term (current) use of oral hypoglycemic drugs: Secondary | ICD-10-CM | POA: Insufficient documentation

## 2018-05-25 DIAGNOSIS — Y9389 Activity, other specified: Secondary | ICD-10-CM | POA: Insufficient documentation

## 2018-05-25 DIAGNOSIS — F17210 Nicotine dependence, cigarettes, uncomplicated: Secondary | ICD-10-CM | POA: Diagnosis present

## 2018-05-25 DIAGNOSIS — I110 Hypertensive heart disease with heart failure: Secondary | ICD-10-CM | POA: Insufficient documentation

## 2018-05-25 DIAGNOSIS — E118 Type 2 diabetes mellitus with unspecified complications: Secondary | ICD-10-CM | POA: Diagnosis present

## 2018-05-25 DIAGNOSIS — F10231 Alcohol dependence with withdrawal delirium: Secondary | ICD-10-CM | POA: Diagnosis not present

## 2018-05-25 DIAGNOSIS — IMO0002 Reserved for concepts with insufficient information to code with codable children: Secondary | ICD-10-CM

## 2018-05-25 DIAGNOSIS — X781XXA Intentional self-harm by knife, initial encounter: Secondary | ICD-10-CM | POA: Insufficient documentation

## 2018-05-25 DIAGNOSIS — F419 Anxiety disorder, unspecified: Secondary | ICD-10-CM | POA: Diagnosis present

## 2018-05-25 DIAGNOSIS — S60812A Abrasion of left wrist, initial encounter: Secondary | ICD-10-CM | POA: Insufficient documentation

## 2018-05-25 DIAGNOSIS — R45851 Suicidal ideations: Secondary | ICD-10-CM | POA: Diagnosis present

## 2018-05-25 DIAGNOSIS — Z7289 Other problems related to lifestyle: Secondary | ICD-10-CM

## 2018-05-25 DIAGNOSIS — F10939 Alcohol use, unspecified with withdrawal, unspecified: Secondary | ICD-10-CM | POA: Diagnosis present

## 2018-05-25 DIAGNOSIS — G7000 Myasthenia gravis without (acute) exacerbation: Secondary | ICD-10-CM | POA: Diagnosis present

## 2018-05-25 DIAGNOSIS — S60811A Abrasion of right wrist, initial encounter: Secondary | ICD-10-CM | POA: Insufficient documentation

## 2018-05-25 DIAGNOSIS — F17200 Nicotine dependence, unspecified, uncomplicated: Secondary | ICD-10-CM | POA: Diagnosis present

## 2018-05-25 DIAGNOSIS — Z886 Allergy status to analgesic agent status: Secondary | ICD-10-CM

## 2018-05-25 DIAGNOSIS — F332 Major depressive disorder, recurrent severe without psychotic features: Secondary | ICD-10-CM | POA: Diagnosis present

## 2018-05-25 DIAGNOSIS — F10239 Alcohol dependence with withdrawal, unspecified: Secondary | ICD-10-CM | POA: Diagnosis present

## 2018-05-25 DIAGNOSIS — I10 Essential (primary) hypertension: Secondary | ICD-10-CM | POA: Diagnosis present

## 2018-05-25 DIAGNOSIS — Z9114 Patient's other noncompliance with medication regimen: Secondary | ICD-10-CM | POA: Diagnosis not present

## 2018-05-25 DIAGNOSIS — Z888 Allergy status to other drugs, medicaments and biological substances status: Secondary | ICD-10-CM | POA: Diagnosis not present

## 2018-05-25 DIAGNOSIS — F329 Major depressive disorder, single episode, unspecified: Secondary | ICD-10-CM | POA: Insufficient documentation

## 2018-05-25 DIAGNOSIS — F1010 Alcohol abuse, uncomplicated: Secondary | ICD-10-CM | POA: Insufficient documentation

## 2018-05-25 DIAGNOSIS — Y908 Blood alcohol level of 240 mg/100 ml or more: Secondary | ICD-10-CM | POA: Diagnosis present

## 2018-05-25 DIAGNOSIS — Z23 Encounter for immunization: Secondary | ICD-10-CM | POA: Insufficient documentation

## 2018-05-25 DIAGNOSIS — G8929 Other chronic pain: Secondary | ICD-10-CM | POA: Diagnosis present

## 2018-05-25 DIAGNOSIS — F10929 Alcohol use, unspecified with intoxication, unspecified: Secondary | ICD-10-CM

## 2018-05-25 DIAGNOSIS — F10230 Alcohol dependence with withdrawal, uncomplicated: Secondary | ICD-10-CM

## 2018-05-25 DIAGNOSIS — Y929 Unspecified place or not applicable: Secondary | ICD-10-CM | POA: Insufficient documentation

## 2018-05-25 DIAGNOSIS — F1994 Other psychoactive substance use, unspecified with psychoactive substance-induced mood disorder: Secondary | ICD-10-CM | POA: Diagnosis present

## 2018-05-25 DIAGNOSIS — Y998 Other external cause status: Secondary | ICD-10-CM | POA: Insufficient documentation

## 2018-05-25 DIAGNOSIS — F1020 Alcohol dependence, uncomplicated: Secondary | ICD-10-CM | POA: Diagnosis present

## 2018-05-25 DIAGNOSIS — I4891 Unspecified atrial fibrillation: Secondary | ICD-10-CM | POA: Diagnosis present

## 2018-05-25 DIAGNOSIS — Z89422 Acquired absence of other left toe(s): Secondary | ICD-10-CM

## 2018-05-25 DIAGNOSIS — Z79899 Other long term (current) drug therapy: Secondary | ICD-10-CM | POA: Diagnosis not present

## 2018-05-25 DIAGNOSIS — I5042 Chronic combined systolic (congestive) and diastolic (congestive) heart failure: Secondary | ICD-10-CM | POA: Insufficient documentation

## 2018-05-25 LAB — CBC WITH DIFFERENTIAL/PLATELET
Abs Immature Granulocytes: 0.05 10*3/uL (ref 0.00–0.07)
Basophils Absolute: 0.1 10*3/uL (ref 0.0–0.1)
Basophils Relative: 1 %
Eosinophils Absolute: 0 10*3/uL (ref 0.0–0.5)
Eosinophils Relative: 0 %
HEMATOCRIT: 44 % (ref 39.0–52.0)
HEMOGLOBIN: 14.1 g/dL (ref 13.0–17.0)
Immature Granulocytes: 1 %
Lymphocytes Relative: 35 %
Lymphs Abs: 2.1 10*3/uL (ref 0.7–4.0)
MCH: 26.4 pg (ref 26.0–34.0)
MCHC: 32 g/dL (ref 30.0–36.0)
MCV: 82.2 fL (ref 80.0–100.0)
Monocytes Absolute: 0.7 10*3/uL (ref 0.1–1.0)
Monocytes Relative: 11 %
Neutro Abs: 3.1 10*3/uL (ref 1.7–7.7)
Neutrophils Relative %: 52 %
Platelets: 298 10*3/uL (ref 150–400)
RBC: 5.35 MIL/uL (ref 4.22–5.81)
RDW: 21.2 % — AB (ref 11.5–15.5)
Smear Review: NORMAL
WBC: 6 10*3/uL (ref 4.0–10.5)
nRBC: 0 % (ref 0.0–0.2)

## 2018-05-25 LAB — LIPASE, BLOOD: Lipase: 48 U/L (ref 11–51)

## 2018-05-25 LAB — COMPREHENSIVE METABOLIC PANEL
ALT: 24 U/L (ref 0–44)
AST: 28 U/L (ref 15–41)
Albumin: 4.2 g/dL (ref 3.5–5.0)
Alkaline Phosphatase: 103 U/L (ref 38–126)
Anion gap: 11 (ref 5–15)
BUN: 5 mg/dL — ABNORMAL LOW (ref 6–20)
CO2: 26 mmol/L (ref 22–32)
Calcium: 8.8 mg/dL — ABNORMAL LOW (ref 8.9–10.3)
Chloride: 102 mmol/L (ref 98–111)
Creatinine, Ser: 0.65 mg/dL (ref 0.61–1.24)
GFR calc Af Amer: >60 mL/min (ref >60)
GFR calc non Af Amer: >60 mL/min (ref >60)
Glucose, Bld: 127 mg/dL — ABNORMAL HIGH (ref 70–99)
POTASSIUM: 3.3 mmol/L — AB (ref 3.5–5.1)
Sodium: 139 mmol/L (ref 135–145)
Total Bilirubin: 0.5 mg/dL (ref 0.3–1.2)
Total Protein: 7.7 g/dL (ref 6.5–8.1)

## 2018-05-25 LAB — ACETAMINOPHEN LEVEL: Acetaminophen (Tylenol), Serum: <10 ug/mL — ABNORMAL LOW (ref 10–30)

## 2018-05-25 LAB — SALICYLATE LEVEL

## 2018-05-25 MED ORDER — THIAMINE HCL 100 MG/ML IJ SOLN
Freq: Once | INTRAVENOUS | Status: AC
  Administered 2018-05-25: 02:00:00 via INTRAVENOUS
  Filled 2018-05-25: qty 1000

## 2018-05-25 MED ORDER — LORAZEPAM 1 MG PO TABS
1.0000 mg | ORAL_TABLET | Freq: Every day | ORAL | Status: AC
  Administered 2018-05-29: 1 mg via ORAL
  Filled 2018-05-25: qty 1

## 2018-05-25 MED ORDER — ACETAMINOPHEN 325 MG PO TABS
650.0000 mg | ORAL_TABLET | Freq: Once | ORAL | Status: AC
  Administered 2018-05-25: 650 mg via ORAL
  Filled 2018-05-25: qty 2

## 2018-05-25 MED ORDER — TETANUS-DIPHTH-ACELL PERTUSSIS 5-2.5-18.5 LF-MCG/0.5 IM SUSP
0.5000 mL | Freq: Once | INTRAMUSCULAR | Status: AC
  Administered 2018-05-25: 0.5 mL via INTRAMUSCULAR
  Filled 2018-05-25: qty 0.5

## 2018-05-25 MED ORDER — CLONIDINE HCL 0.1 MG PO TABS: 0.1000 mg | ORAL_TABLET | ORAL | Status: DC

## 2018-05-25 MED ORDER — LOPERAMIDE HCL 2 MG PO CAPS
2.0000 mg | ORAL_CAPSULE | ORAL | Status: AC | PRN
  Administered 2018-05-25: 4 mg via ORAL
  Filled 2018-05-25 (×2): qty 1

## 2018-05-25 MED ORDER — LORAZEPAM 1 MG PO TABS
1.0000 mg | ORAL_TABLET | Freq: Once | ORAL | Status: AC
  Administered 2018-05-25: 1 mg via ORAL
  Filled 2018-05-25: qty 1

## 2018-05-25 MED ORDER — ACETAMINOPHEN 500 MG PO TABS
1000.0000 mg | ORAL_TABLET | Freq: Once | ORAL | Status: AC
  Administered 2018-05-25: 1000 mg via ORAL

## 2018-05-25 MED ORDER — ONDANSETRON 4 MG PO TBDP: 4.0000 mg | ORAL_TABLET | Freq: Four times a day (QID) | ORAL | Status: AC | PRN

## 2018-05-25 MED ORDER — MYCOPHENOLATE MOFETIL 250 MG PO CAPS
500.0000 mg | ORAL_CAPSULE | Freq: Every day | ORAL | Status: DC
  Administered 2018-05-26 – 2018-05-30 (×5): 500 mg via ORAL
  Filled 2018-05-25 (×5): qty 2

## 2018-05-25 MED ORDER — ADULT MULTIVITAMIN W/MINERALS CH
1.0000 | ORAL_TABLET | Freq: Every day | ORAL | Status: DC
  Administered 2018-05-25 – 2018-05-30 (×6): 1 via ORAL
  Filled 2018-05-25 (×6): qty 1

## 2018-05-25 MED ORDER — ACETAMINOPHEN 500 MG PO TABS
ORAL_TABLET | ORAL | Status: AC
  Filled 2018-05-25: qty 2

## 2018-05-25 MED ORDER — VITAMIN B-1 100 MG PO TABS
100.0000 mg | ORAL_TABLET | Freq: Every day | ORAL | Status: DC
  Administered 2018-05-25: 100 mg via ORAL
  Filled 2018-05-25: qty 1

## 2018-05-25 MED ORDER — CLONIDINE HCL 0.1 MG/24HR TD PTWK
0.1000 mg | MEDICATED_PATCH | TRANSDERMAL | Status: DC
  Filled 2018-05-25: qty 1

## 2018-05-25 MED ORDER — LORAZEPAM 2 MG/ML IJ SOLN
0.0000 mg | Freq: Four times a day (QID) | INTRAMUSCULAR | Status: DC
  Administered 2018-05-25 (×2): 1 mg via INTRAVENOUS
  Filled 2018-05-25 (×2): qty 1

## 2018-05-25 MED ORDER — ACETAMINOPHEN 325 MG PO TABS
650.0000 mg | ORAL_TABLET | Freq: Four times a day (QID) | ORAL | Status: DC | PRN
  Administered 2018-05-25 – 2018-05-29 (×12): 650 mg via ORAL
  Filled 2018-05-25 (×13): qty 2

## 2018-05-25 MED ORDER — LORAZEPAM 1 MG PO TABS
1.0000 mg | ORAL_TABLET | Freq: Four times a day (QID) | ORAL | Status: AC | PRN
  Administered 2018-05-27 – 2018-05-28 (×2): 1 mg via ORAL
  Filled 2018-05-25 (×2): qty 1

## 2018-05-25 MED ORDER — METFORMIN HCL 500 MG PO TABS
500.0000 mg | ORAL_TABLET | Freq: Every day | ORAL | Status: DC
  Administered 2018-05-26 – 2018-05-30 (×5): 500 mg via ORAL
  Filled 2018-05-25 (×5): qty 1

## 2018-05-25 MED ORDER — HYDROXYZINE HCL 25 MG PO TABS
25.0000 mg | ORAL_TABLET | Freq: Four times a day (QID) | ORAL | Status: AC | PRN
  Administered 2018-05-25 – 2018-05-28 (×5): 25 mg via ORAL
  Filled 2018-05-25 (×5): qty 1

## 2018-05-25 MED ORDER — CLONIDINE HCL 0.1 MG PO TABS: 0.1000 mg | ORAL_TABLET | Freq: Four times a day (QID) | ORAL | Status: DC

## 2018-05-25 MED ORDER — CLONIDINE HCL 0.1 MG PO TABS
0.1000 mg | ORAL_TABLET | Freq: Every day | ORAL | Status: DC
  Administered 2018-05-25 – 2018-05-29 (×5): 0.1 mg via ORAL
  Filled 2018-05-25 (×5): qty 1

## 2018-05-25 MED ORDER — METOPROLOL SUCCINATE ER 25 MG PO TB24
25.0000 mg | ORAL_TABLET | Freq: Every day | ORAL | Status: DC
  Administered 2018-05-25 – 2018-05-29 (×5): 25 mg via ORAL
  Filled 2018-05-25 (×5): qty 1

## 2018-05-25 MED ORDER — MYCOPHENOLATE MOFETIL 250 MG PO CAPS
1000.0000 mg | ORAL_CAPSULE | Freq: Every day | ORAL | Status: DC
  Administered 2018-05-25 – 2018-05-29 (×5): 1000 mg via ORAL
  Filled 2018-05-25 (×6): qty 4

## 2018-05-25 MED ORDER — LORAZEPAM 1 MG PO TABS
1.0000 mg | ORAL_TABLET | Freq: Two times a day (BID) | ORAL | Status: AC
  Administered 2018-05-28 (×2): 1 mg via ORAL
  Filled 2018-05-25 (×2): qty 1

## 2018-05-25 MED ORDER — DICYCLOMINE HCL 20 MG PO TABS: 20.0000 mg | ORAL_TABLET | Freq: Four times a day (QID) | ORAL | Status: DC | PRN

## 2018-05-25 MED ORDER — VITAMIN B-1 100 MG PO TABS
100.0000 mg | ORAL_TABLET | Freq: Every day | ORAL | Status: DC
  Administered 2018-05-26 – 2018-05-30 (×5): 100 mg via ORAL
  Filled 2018-05-25 (×5): qty 1

## 2018-05-25 MED ORDER — METHOCARBAMOL 500 MG PO TABS
500.0000 mg | ORAL_TABLET | Freq: Three times a day (TID) | ORAL | Status: DC | PRN
  Administered 2018-05-26 – 2018-05-30 (×6): 500 mg via ORAL
  Filled 2018-05-25 (×8): qty 1

## 2018-05-25 MED ORDER — THIAMINE HCL 100 MG/ML IJ SOLN: 100.0000 mg | Freq: Every day | INTRAMUSCULAR | Status: DC

## 2018-05-25 MED ORDER — CLONIDINE HCL 0.1 MG PO TABS: 0.1000 mg | ORAL_TABLET | Freq: Every day | ORAL | Status: DC

## 2018-05-25 MED ORDER — LORAZEPAM 1 MG PO TABS
1.0000 mg | ORAL_TABLET | Freq: Three times a day (TID) | ORAL | Status: AC
  Administered 2018-05-27 (×3): 1 mg via ORAL
  Filled 2018-05-25 (×3): qty 1

## 2018-05-25 MED ORDER — LORAZEPAM 1 MG PO TABS
1.0000 mg | ORAL_TABLET | Freq: Four times a day (QID) | ORAL | Status: AC
  Administered 2018-05-25 – 2018-05-26 (×6): 1 mg via ORAL
  Filled 2018-05-25 (×6): qty 1

## 2018-05-25 NOTE — Tx Team (Addendum)
Initial Treatment Plan 05/25/2018 4:46 PM Kurt Horton [id number]    PATIENT STRESSORS: Marital or family conflict Medication change or noncompliance Substance abuse   PATIENT STRENGTHS: Metallurgist fund of knowledge   PATIENT IDENTIFIED PROBLEMS: Substance Abuse  Suicidal Ideation                   DISCHARGE CRITERIA:  Improved stabilization in mood, thinking, and/or behavior  PRELIMINARY DISCHARGE PLAN: Outpatient therapy  PATIENT/FAMILY INVOLVEMENT: This treatment plan has been presented to and reviewed with the patient, Kurt Horton, and/or family member, .  The patient and family have been given the opportunity to ask questions and make suggestions.  Shelia Media, RN 05/25/2018, 4:46 PM

## 2018-05-25 NOTE — ED Provider Notes (Addendum)
[facility] Emergency Department Provider Note   ____________________________________________   First MD Initiated Contact with Patient 05/25/18 [phone]     (approximate)  I have reviewed the triage vital signs and the nursing notes.   HISTORY  Chief Complaint Psychiatric Evaluation    HPI Kurt Horton is a 58 y.o. male brought to the ED by [employer] from home status post self-inflicted injury and requesting alcohol detox.  Patient is an alcoholic who is well-known to the emergency department.  Does have a history of significant DTs requiring medical admission.  He was just seen 2 days ago for intoxication and discharged home.  States he cut his wrist with a steak knife because he wanted to hurt himself.  Now desires alcohol detox.  Last drink was earlier this evening.  Tetanus is unknown.  Denies chest pain, shortness of breath, abdominal pain, nausea or vomiting.  Denies recent travel or trauma.   Past Medical History:  Diagnosis Date  . A-fib (HCC)   . Alcohol abuse   . Alcohol withdrawal (HCC) 11/12/2016  . Diabetes mellitus without complication (HCC)   . DJD (degenerative joint disease)   . Hypertension   . Myasthenia gravis (HCC)   . Renal disorder     Patient Active Problem List   Diagnosis Date Noted  . GI bleed 02/04/2018  . Alcohol intoxication (HCC) 12/22/2017  . Malnutrition of moderate degree 10/04/2017  . Chronic combined systolic and diastolic CHF (congestive heart failure) (HCC) 08/13/2017  . Alcohol withdrawal delirium (HCC) 02/26/2017  . Atrial fibrillation with RVR (HCC) 02/25/2017  . Acute alcoholic intoxication with complication (HCC)   . Alcohol abuse   . Alcohol withdrawal (HCC) 11/13/2016  . Substance induced mood disorder (HCC) 11/13/2016  . SVT (supraventricular tachycardia) (HCC) 11/12/2016  . HTN (hypertension) 11/12/2016  . EtOH dependence (HCC) 11/12/2016  . Lumbar radiculopathy 08/24/2016  . Depression with  anxiety 08/24/2016  . Chronic low back pain 01/04/2016  . Pressure ulcer 12/29/2015  . Osteomyelitis (HCC) 12/26/2015  . PVD (peripheral vascular disease) (HCC) 12/26/2015  . Type II diabetes mellitus with manifestations (HCC) 12/26/2015  . Myasthenia gravis (HCC) 12/26/2015    Past Surgical History:  Procedure Laterality Date  . AMPUTATION TOE Left 12/29/2015   Procedure: AMPUTATION TOE;  Surgeon: Gwyneth RevelsJustin Fowler, DPM;  Location: [facility] ORS;  Service: Podiatry;  Laterality: Left;  . ESOPHAGOGASTRODUODENOSCOPY N/A 02/05/2018   Procedure: ESOPHAGOGASTRODUODENOSCOPY (EGD);  Surgeon: Pasty Spillersahiliani, Varnita B, MD;  [facility];  Service: Endoscopy;  Laterality: N/A;  . PERIPHERAL VASCULAR CATHETERIZATION N/A 12/27/2015   Procedure: Lower Extremity Angiography;  Surgeon: Annice NeedyJason S Dew, MD;  Location: [facility] INVASIVE CV LAB;  Service: Cardiovascular;  Laterality: N/A;  . right hip surgery    . right shoulder surgery      Prior to Admission medications   Medication Sig Start Date End Date Taking? Authorizing Provider  B Complex-C (B-COMPLEX WITH VITAMIN C) tablet Take 1 tablet by mouth daily. 11/02/17  Yes Wieting, Richard, MD  busPIRone (BUSPAR) 15 MG tablet Take 1 tablet (15 mg total) by mouth 2 (two) times daily. 04/10/18  Yes Gouru, Deanna ArtisAruna, MD  DULoxetine (CYMBALTA) 30 MG capsule Take 30 mg by mouth daily at 6 (six) AM. 03/20/18 03/20/19 Yes [provider]  gabapentin (NEURONTIN) 300 MG capsule Take 2 capsules (600 mg total) by mouth 3 (three) times daily. 11/02/17  Yes Wieting, Richard, MD  metFORMIN (GLUCOPHAGE) 500 MG tablet Take 1 tablet (500 mg total)  by mouth daily. 11/02/17  Yes Wieting, Richard, MD  metoprolol succinate (TOPROL-XL) 50 MG 24 hr tablet Take 1 tablet (50 mg total) by mouth daily. Take with or immediately following a meal. 11/02/17  Yes Wieting, Richard, MD  multivitamin (ONE-A-DAY MEN'S) TABS tablet Take 1 tablet by mouth daily. 04/19/18  Yes Shaune Pollack, MD    mycophenolate (CELLCEPT) 500 MG tablet Take 1 tablet (500 mg total) by mouth 2 (two) times daily. Take 1 tablet (500MG ) by mouth every morning and 2 tablets (1000MG ) by mouth every evening Patient taking differently: Take 500-1,000 mg by mouth See admin instructions. 500 mg every morning and 1000 mg at bedtime 11/02/17  Yes Wieting, Richard, MD  oxyCODONE (OXY IR/ROXICODONE) 5 MG immediate release tablet Take 1 tablet (5 mg total) by mouth every 6 (six) hours as needed for severe pain. 05/21/18  Yes Sudini, Wardell Heath, MD  senna-docusate (SENOKOT-S) 8.6-50 MG tablet Take 1 tablet by mouth at bedtime as needed for mild constipation. Patient taking differently: Take 1 tablet by mouth daily.  12/25/17  Yes Mayo, Allyn Kenner, MD  testosterone cypionate (DEPOTESTOSTERONE CYPIONATE) 200 MG/ML injection Inject 200 mg into the muscle every 14 (fourteen) days.   Yes [provider]  thiamine 100 MG tablet Take 1 tablet (100 mg total) by mouth daily. 04/19/18  Yes Shaune Pollack, MD    Allergies Nsaids and Other  Family History  Problem Relation Age of Onset  . Rheum arthritis Father     Social History Social History   Tobacco Use  . Smoking status: Current Every Day Smoker    Packs/day: 0.50    Types: Cigarettes  . Smokeless tobacco: Never Used  Substance Use Topics  . Alcohol use: Yes    Comment: Pt had 20 airplane bottles today  . Drug use: No    Review of Systems  Constitutional: No fever/chills Eyes: No visual changes. ENT: No sore throat. Cardiovascular: Denies chest pain. Respiratory: Denies shortness of breath. Gastrointestinal: No abdominal pain.  No nausea, no vomiting.  No diarrhea.  No constipation. Genitourinary: Negative for dysuria. Musculoskeletal: Negative for back pain. Skin: Positive for self-inflicted wounds to wrists.  Negative for rash. Neurological: Negative for headaches, focal weakness or numbness. Psychiatric:Positive for self-inflicted  injury.   ____________________________________________   PHYSICAL EXAM:  VITAL SIGNS: ED Triage Vitals [05/25/18 0015]  Enc Vitals Group     BP (!) 136/115     Pulse Rate (!) 110     Resp 18     Temp 98.1 F (36.7 C)     Temp Source Oral     SpO2 100 %     Weight 180 lb (81.6 kg)     Height 5\' 11"  (1.803 m)     Head Circumference      Peak Flow      Pain Score      Pain Loc      Pain Edu?      Excl. in GC?     Constitutional: Alert and oriented.  Disheveled appearing and in mild acute distress. Eyes: Conjunctivae are normal. PERRL. EOMI. Head: Atraumatic. Nose: No congestion/rhinnorhea. Mouth/Throat: Mucous membranes are moist.  Oropharynx non-erythematous. Neck: No stridor.   Cardiovascular: Normal rate, regular rhythm. Grossly normal heart sounds.  Good peripheral circulation. Respiratory: Normal respiratory effort.  No retractions. Lungs CTAB. Gastrointestinal: Soft and nontender. No distention. No abdominal bruits. No CVA tenderness. Musculoskeletal:  BUE: Superficial abrasions to bilateral volar wrists without active bleeding.  2+ radial pulses.  Brisk, less than 5-second capillary refill. No lower extremity tenderness nor edema.  No joint effusions. Neurologic:  Normal speech and language. No gross focal neurologic deficits are appreciated.  Skin:  Skin is warm, dry and intact. No rash noted. Psychiatric: Mood and affect are normal. Speech and behavior are normal.  ____________________________________________   LABS (all labs ordered are listed, but only abnormal results are displayed)  Labs Reviewed  CBC WITH DIFFERENTIAL/PLATELET - Abnormal; Notable for the following components:      Result Value   RDW 21.2 (*)    All other components within normal limits  COMPREHENSIVE METABOLIC PANEL - Abnormal; Notable for the following components:   Potassium 3.3 (*)    Glucose, Bld 127 (*)    BUN 5 (*)    Calcium 8.8 (*)    All other components within normal  limits  ETHANOL - Abnormal; Notable for the following components:   Alcohol, Ethyl (B) 288 (*)    All other components within normal limits  ACETAMINOPHEN LEVEL - Abnormal; Notable for the following components:   Acetaminophen (Tylenol), Serum <10 (*)    All other components within normal limits  SALICYLATE LEVEL  LIPASE, BLOOD  URINE DRUG SCREEN, QUALITATIVE ([facility] ONLY)   ____________________________________________  EKG  None ____________________________________________  RADIOLOGY  ED MD interpretation: None  Official radiology report(s): No results found.  ____________________________________________   PROCEDURES  Procedure(s) performed: None  Procedures  Critical Care performed: No  ____________________________________________   INITIAL IMPRESSION / ASSESSMENT AND PLAN / ED COURSE  As part of my medical decision making, I reviewed the following data within the electronic MEDICAL RECORD NUMBER Nursing notes reviewed and incorporated, Labs reviewed, Old chart reviewed, A consult was requested and obtained from this/these consultant(s) Psychiatry and Notes from prior ED visits   58 year old alcoholic who presents status post self-inflicted injury; also desires detox.  Will check screening toxicological lab work and urinalysis; placed on CIWA protocol.  Will place under involuntary commitment for his safety.  Update tetanus.  Clinical Course as of May 25 653  Sat May 25, 2018  6599 Patient sleeping no acute distress.  Remains under IVC pending tele-psychiatry evaluation.   [JS]  [phone] Patient was evaluated by [facility] psychiatrist Dr. Sharlene Motts who recommends admission to inpatient substance abuse program.   [JS]    Clinical Course User Index [JS] Irean Hong, MD     ____________________________________________   FINAL CLINICAL IMPRESSION(S) / ED DIAGNOSES  Final diagnoses:  Alcohol abuse  Self-inflicted injury     ED Discharge Orders    None        Note:  This document was prepared using Dragon voice recognition software and may include unintentional dictation errors.    Irean Hong, MD 05/25/18 1779    Irean Hong, MD 05/25/18 [phone]

## 2018-05-25 NOTE — ED Triage Notes (Signed)
Patient brought in by [employer] from home. Patient has superficial lacerations to bilateral wrist. Patient states that he cut himself with a kitchen knife. Patient states that he wants to hurt himself. Patient states that he also wants help with detox from alcohol.

## 2018-05-25 NOTE — BH Assessment (Signed)
Assessment Note  Kurt Horton is an 58 y.o. male. Patient presents to [facility] by [place] police under IVC due to self-inflicted injury and alcohol detox. Patient was just seen 2 day ago for intoxication and discharged home. Patient reports he cut his wrist with a steak knife because he wanted to hurt himself. Patient endorses chronic depression due to not being able to see his children. Patient reports he lives with his parents. Patient states he drinks 20 "airline bottles" of alcohol daily.   Patient doesn't currently have any involvement in the legal system.  Patient denies previous inpatient psychiatric history and outpatient mental health providers.   Patient presents oriented x 4, and cooperative during assessment.    Diagnosis: Depression  Past Medical History:  Past Medical History:  Diagnosis Date  . A-fib (HCC)   . Alcohol abuse   . Alcohol withdrawal (HCC) 11/12/2016  . Diabetes mellitus without complication (HCC)   . DJD (degenerative joint disease)   . Hypertension   . Myasthenia gravis (HCC)   . Renal disorder     Past Surgical History:  Procedure Laterality Date  . AMPUTATION TOE Left 12/29/2015   Procedure: AMPUTATION TOE;  Surgeon: Kurt Horton, DPM;  Location: [facility] ORS;  Service: Podiatry;  Laterality: Left;  . ESOPHAGOGASTRODUODENOSCOPY N/A 02/05/2018   Procedure: ESOPHAGOGASTRODUODENOSCOPY (EGD);  Surgeon: Kurt Horton, Kurt B, MD;  [facility];  Service: Endoscopy;  Laterality: N/A;  . PERIPHERAL VASCULAR CATHETERIZATION N/A 12/27/2015   Procedure: Lower Extremity Angiography;  Surgeon: Kurt NeedyJason S Dew, MD;  Location: [facility] INVASIVE CV LAB;  Service: Cardiovascular;  Laterality: N/A;  . right hip surgery    . right shoulder surgery      Family History:  Family History  Problem Relation Age of Onset  . Rheum arthritis Father     Social History:  reports that he has been smoking cigarettes. He has been smoking about 0.50 packs per day. He has never  used smokeless tobacco. He reports current alcohol use. He reports that he does not use drugs.  Additional Social History:  Alcohol / Drug Use Pain Medications: SEE PTA  Prescriptions: SEE PTA  Over the Counter: SEE PTA  History of alcohol / drug use?: Yes Longest period of sobriety (when/how long): Unknown Negative Consequences of Use: Financial, Personal relationships Withdrawal Symptoms: Fever / Chills, Tremors, Nausea / Vomiting Substance #1 Name of Substance 1: Alcohol  1 - Age of First Use: Unknown 1 - Amount (size/oz): Unknown 1 - Frequency: Daily  1 - Duration: Ongoing  1 - Last Use / Amount: 05/25/2017  CIWA: CIWA-Ar BP: (!) 156/97 Pulse Rate: (!) 108 Nausea and Vomiting: no nausea and no vomiting Tactile Disturbances: none Tremor: three Auditory Disturbances: not present Paroxysmal Sweats: two Visual Disturbances: not present Anxiety: two Headache, Fullness in Head: none present Agitation: somewhat more than normal activity Orientation and Clouding of Sensorium: oriented and can do serial additions CIWA-Ar Total: 8 COWS:    Allergies:  Allergies  Allergen Reactions  . Nsaids Other (See Comments)    Duodenal ulcers, GI bleeding  . Other Other (See Comments)    Patient states he can't take any "mycin" medications because myasthenia gravis.    Home Medications: (Not in a hospital admission)   OB/GYN Status:  No LMP for male patient.  General Assessment Data Assessment unable to be completed: (Assessment completed ) [facility] T[facility] Assessment: In system Is this a Tele or Face-to-Face Assessment?: Face-to-Face Is this an Initial  Assessment or a Re-assessment for this encounter?: Initial Assessment Patient Accompanied by:: N/A Language Other than English: No Living Arrangements: Other (Comment) What gender do you identify as?: Male Marital status: Divorced [place] Horton name: N/A Pregnancy Status: No Living Arrangements: Parent Can pt  return to current living arrangement?: Yes Admission Status: Involuntary Petitioner: ED Attending Is patient capable of signing voluntary admission?: No Referral Source: Self/Family/Friend Insurance type: No insurance   Medical Screening Exam [facility] Walk-in ONLY) Medical Exam completed: Yes  Crisis Care Plan Living Arrangements: Parent Legal Guardian: Other:(None reported ) Name of Psychiatrist: None reported  Name of Therapist: None reported   Education Status Is patient currently in school?: No Is the patient employed, unemployed or receiving disability?: Unemployed  Risk to self with the past 6 months Suicidal Ideation: Yes-Currently Present Has patient been a risk to self within the past 6 months prior to admission? : Yes Suicidal Intent: Yes-Currently Present Has patient had any suicidal intent within the past 6 months prior to admission? : Yes Is patient at risk for suicide?: Yes Suicidal Plan?: Yes-Currently Present Has patient had any suicidal plan within the past 6 months prior to admission? : Yes Specify Current Suicidal Plan: cut Access to Means: Yes Specify Access to Suicidal Means: knife What has been your use of drugs/alcohol within the last 12 months?: Ongoing alcohol use  Previous Attempts/Gestures: Yes How many times?: 1 Other Self Harm Risks: Ongoing substance use Triggers for Past Attempts: Unpredictable Intentional Self Injurious Behavior: None Family Suicide History: No Recent stressful life event(s): Loss (Comment) Persecutory voices/beliefs?: Yes Depression: Yes Depression Symptoms: Feeling worthless/self pity, Feeling angry/irritable Substance abuse history and/or treatment for substance abuse?: Yes Suicide prevention information given to non-admitted patients: Not applicable  Risk to Others within the past 6 months Homicidal Ideation: No Does patient have any lifetime risk of violence toward others beyond the six months prior to admission? :  No Thoughts of Harm to Others: No Current Homicidal Intent: No Current Homicidal Plan: No Access to Homicidal Means: No Identified Victim: None reported History of harm to others?: No Assessment of Violence: None Noted Violent Behavior Description: None reported  Does patient have access to weapons?: Yes (Comment) Criminal Charges Pending?: No Does patient have a court date: No Is patient on probation?: No  Psychosis Hallucinations: None noted Delusions: None noted  Mental Status Report Appearance/Hygiene: In hospital gown Eye Contact: Poor Motor Activity: Tremors Speech: Unremarkable Level of Consciousness: Alert Mood: Depressed Affect: Depressed Anxiety Level: None Thought Processes: Circumstantial Judgement: Impaired Orientation: Person, Place, Time, Situation, Appropriate for developmental age Obsessive Compulsive Thoughts/Behaviors: None  Cognitive Functioning Concentration: Normal Memory: Remote Intact, Recent Intact Is patient IDD: No Insight: Fair Impulse Control: Poor Appetite: Fair Have you had any weight changes? : No Change Sleep: Decreased Total Hours of Sleep: 3 Vegetative Symptoms: None  ADLScreening [facility] Assessment Services) Patient's cognitive ability adequate to safely complete daily activities?: Yes Patient able to express need for assistance with ADLs?: Yes Independently performs ADLs?: Yes (appropriate for developmental age)  Prior Inpatient Therapy Prior Inpatient Therapy: No  Prior Outpatient Therapy Prior Outpatient Therapy: No Does patient have an ACCT team?: No Does patient have Intensive In-House Services?  : No Does patient have [facility] services? : No Does patient have P4CC services?: No  ADL Screening (condition at time of admission) Patient's cognitive ability adequate to safely complete daily activities?: Yes Does the patient have difficulty seeing, even when wearing glasses/contacts?: No Does the patient have difficulty  concentrating,  remembering, or making decisions?: No Patient able to express need for assistance with ADLs?: Yes Does the patient have difficulty dressing or bathing?: No Independently performs ADLs?: Yes (appropriate for developmental age) Does the patient have difficulty walking or climbing stairs?: No Weakness of Legs: None Weakness of Arms/Hands: None  Home Assistive Devices/Equipment Home Assistive Devices/Equipment: None  Therapy Consults (therapy consults require a physician order) PT Evaluation Needed: No OT Evalulation Needed: No SLP Evaluation Needed: No Abuse/Neglect Assessment (Assessment to be complete while patient is alone) Abuse/Neglect Assessment Can Be Completed: Yes Physical Abuse: Denies Verbal Abuse: Denies Exploitation of patient/patient's resources: Denies Self-Neglect: Denies Values / Beliefs Cultural Requests During Hospitalization: None Consults Spiritual Care Consult Needed: No Social Work Consult Needed: No Merchant navy officerAdvance Directives (For Healthcare) Does Patient Have a Medical Advance Directive?: No          Disposition:  Disposition Initial Assessment Completed for this Encounter: Yes Patient referred to: Other (Comment)([facility] )  On Site Evaluation by:   Reviewed with Physician:    Galen ManilaFEDORIA L Immaculate Crutcher, [facility], LCAS-A 05/25/2018 10:51 AM

## 2018-05-25 NOTE — ED Notes (Signed)
[facility] police department brought patient in from home for suicidal idealization.  Pt. Has bilateral wrists wrapped in gauze.  Pt. With self-inflicted superficial lacerations to bilateral wrists.  Pt. Requests detox from alcohol.  Pt. States no drinking today, officer who brought patient in stated family said patient has been drinking today.

## 2018-05-25 NOTE — H&P (Signed)
Psychiatric Admission Assessment Adult  Patient Identification: Kurt Horton MRN:  [id number] Date of Evaluation:  05/25/2018 Chief Complaint: "I tried to kill myself"   Diagnosis:   Suicidal Behavior Substance induced mood disorder Alcohol use disorder, severe Alcohol Use Alcohol Withdrawal Diabetes Hypertension History of Myasthenia Gravis  History of Present Illness: This is a 58 year old Caucasian male patient.  Who is currently involuntarily admitted to the inpatient psychiatric ward for recent suicidal behavior, and alcohol withdrawal.  The patient has had 12 admissions to the medical service at this facility for alcohol detox in the past year.  His last admission was from January 9 through January 14, at which point he was discharged to his parents house.  At that time he relapsed on alcohol and presented back to the emergency department on January 16 with a BAL of 392.  At that time notes reflect that he was alert and conversing freely, he was given Librium and discharged back to his parents house after he declined detox.  He reports returning to his parents home, and again relapsing on alcohol.  He stated that he drinks between 25 and 30 "airplane" bottles of rum.  He stated that he became "unable to deal with the pain" and cut his wrists with a kitchen knife prior to telling his parents to call 911.  Psychiatric review of systems: The patient reports chronic difficulties with sleep, energy, appetite, inappropriate feelings of guilt, anhedonia.  However he is unable to separate these symptoms in time from alcohol use.  The patient denies symptoms consistent with bipolar disorder.  He denies symptoms consistent with schizophrenia.  He reports having "anxiety" related to psychosocial stressors.  He denies symptoms associated with OCD he denies any childhood or adult traumatic experiences.  He reports he has been hospitalized multiple times for delirium tremens, and has had times of  hallucinating and was unable to recall if he is ever had a seizure.  He is unable to recall a past period of time of sobriety lasting greater than 1 month.  Stated that his drinking increased from 1 "fifth" ( ) of hard liquor per day to 25-30 "airplane drinks" (>1271ml) over the past 3 years.  He believes that his divorce separation from his family and retiring from his previous job have contributed greatly to his increased use.  He also reported that he has been poorly compliant with his usual medications.  He reports taking 10 mg of Percocet for 2 or more times a day, along with drinking.  During patient's exam he was noted to have an initial coarse tremor present in his bilateral upper extremities, which extinguished during the course of the interview and with distraction.   Total Time spent with patient: 1.5 hours  Past Psychiatric History: He denies any past suicide attempts, he denies any outpatient psychiatric history.  He reports attending rehabilitation in [place] at [place].  He reports that he has been prescribed antidepressants by his primary care provider, and has been on Celexa and Cymbalta.  He denies knowledge of other psychiatric medications.  Is the patient at risk to self? Yes.    Has the patient been a risk to self in the past 6 months? Yes.    Has the patient been a risk to self within the distant past? No.  Is the patient a risk to others? No.  Has the patient been a risk to others in the past 6 months? No.  Has the patient been a risk to others within the  distant past? No.   Prior Inpatient Therapy:   Prior Outpatient Therapy:    Alcohol Screening: 1. How often do you have a drink containing alcohol?: 4 or more times a week 2. How many drinks containing alcohol do you have on a typical day when you are drinking?: 10 or more 3. How often do you have six or more drinks on one occasion?: Daily or almost daily AUDIT-C Score: 12 4. How often during the last year have you  found that you were not able to stop drinking once you had started?: Daily or almost daily 5. How often during the last year have you failed to do what was normally expected from you becasue of drinking?: Daily or almost daily 6. How often during the last year have you needed a first drink in the morning to get yourself going after a heavy drinking session?: Daily or almost daily 7. How often during the last year have you had a feeling of guilt of remorse after drinking?: Daily or almost daily 8. How often during the last year have you been unable to remember what happened the night before because you had been drinking?: Daily or almost daily 9. Have you or someone else been injured as a result of your drinking?: Yes, during the last year 10. Has a relative or friend or a doctor or another health worker been concerned about your drinking or suggested you cut down?: Yes, during the last year Alcohol Use Disorder Identification Test Final Score (AUDIT): 40 Intervention/Follow-up: Alcohol Education, Continued Monitoring Substance Abuse History in the last 12 months:  Yes.   Consequences of Substance Abuse: Medical Consequences:  worsening health, multiple hospitalizations, poor compliance with medications Family Consequences:  divorce, separation, living with parents Blackouts:  multple DT's: multiple admissions Withdrawal Symptoms:   Diarrhea Headaches Tremors Previous Psychotropic Medications: Yes  Psychological Evaluations: Yes  Past Medical History:  Past Medical History:  Diagnosis Date  . A-fib (HCC)   . Alcohol abuse   . Alcohol withdrawal (HCC) 11/12/2016  . Diabetes mellitus without complication (HCC)   . DJD (degenerative joint disease)   . Hypertension   . Myasthenia gravis (HCC)   . Renal disorder     Past Surgical History:  Procedure Laterality Date  . AMPUTATION TOE Left 12/29/2015   Procedure: AMPUTATION TOE;  Surgeon: Gwyneth RevelsJustin Fowler, DPM;  Location: [facility] ORS;  Service:  Podiatry;  Laterality: Left;  . ESOPHAGOGASTRODUODENOSCOPY N/A 02/05/2018   Procedure: ESOPHAGOGASTRODUODENOSCOPY (EGD);  Surgeon: Pasty Spillersahiliani, Varnita B, MD;  [facility];  Service: Endoscopy;  Laterality: N/A;  . PERIPHERAL VASCULAR CATHETERIZATION N/A 12/27/2015   Procedure: Lower Extremity Angiography;  Surgeon: Annice NeedyJason S Dew, MD;  Location: [facility] INVASIVE CV LAB;  Service: Cardiovascular;  Laterality: N/A;  . right hip surgery    . right shoulder surgery     Family History:  Family History  Problem Relation Age of Onset  . Rheum arthritis Father    Family Psychiatric  History: denies Tobacco Screening: Have you used any form of tobacco in the last 30 days? (Cigarettes, Smokeless Tobacco, Cigars, and/or Pipes): Yes Tobacco use, Select all that apply: 4 or less cigarettes per day Are you interested in Tobacco Cessation Medications?: Yes, will notify MD for an order Counseled patient on smoking cessation including recognizing danger situations, developing coping skills and basic information about quitting provided: Refused/Declined practical counseling Social History:  Social History   Substance and Sexual Activity  Alcohol Use Yes  . Alcohol/week: 20.0 -  30.0 standard drinks  . Types: 20 - 30 Shots of liquor per week   Comment: Pt had 20 airplane bottles today     Social History   Substance and Sexual Activity  Drug Use Yes  . Types: Oxycodone    Additional Social History:     Patient reports that he was born in [place].  He states that his childhood was "fine".  He reports that he is in the middle of his siblings he has an older brother and a younger brother along with a younger sister.  Stated that his education was "okay".  He went to college for computer science and obtained his MBA.  He was married at the age of 41 and has 2 children.  He is divorced 3 years ago.  His previous career was as a Best boy.  He has been living with his parents since the  summer 2017.                      Allergies:   Allergies  Allergen Reactions  . Nsaids Other (See Comments)    Duodenal ulcers, GI bleeding  . Other Other (See Comments)    Patient states he can't take any "mycin" medications because myasthenia gravis.   Lab Results:  Results for orders placed or performed during the hospital encounter of 05/25/18 (from the past 48 hour(s))  CBC with Differential     Status: Abnormal   Collection Time: 05/25/18  1:19 AM  Result Value Ref Range   WBC 6.0 4.0 - 10.5 K/uL   RBC 5.35 4.22 - 5.81 MIL/uL   Hemoglobin 14.1 13.0 - 17.0 g/dL   HCT 16.1 09.6 - 04.5 %   MCV 82.2 80.0 - 100.0 fL   MCH 26.4 26.0 - 34.0 pg   MCHC 32.0 30.0 - 36.0 g/dL   RDW 40.9 (H) 81.1 - 91.4 %   Platelets 298 150 - 400 K/uL   nRBC 0.0 0.0 - 0.2 %   Neutrophils Relative % 52 %   Neutro Abs 3.1 1.7 - 7.7 K/uL   Lymphocytes Relative 35 %   Lymphs Abs 2.1 0.7 - 4.0 K/uL   Monocytes Relative 11 %   Monocytes Absolute 0.7 0.1 - 1.0 K/uL   Eosinophils Relative 0 %   Eosinophils Absolute 0.0 0.0 - 0.5 K/uL   Basophils Relative 1 %   Basophils Absolute 0.1 0.0 - 0.1 K/uL   WBC Morphology MORPHOLOGY UNREMARKABLE    RBC Morphology MORPHOLOGY UNREMARKABLE    Smear Review Normal platelet morphology    Immature Granulocytes 1 %   Abs Immature Granulocytes 0.05 0.00 - 0.07 K/uL    Comment: Performed at [facility], [address] [place], [place] [address]  Comprehensive metabolic panel     Status: Abnormal   Collection Time: 05/25/18  1:19 AM  Result Value Ref Range   Sodium 139 135 - 145 mmol/L   Potassium 3.3 (L) 3.5 - 5.1 mmol/L   Chloride 102 98 - 111 mmol/L   CO2 26 22 - 32 mmol/L   Glucose, Bld 127 (H) 70 - 99 mg/dL   BUN 5 (L) 6 - 20 mg/dL   Creatinine, Ser 6.21 0.61 - 1.24 mg/dL   Calcium 8.8 (L) 8.9 - 10.3 mg/dL   Total Protein 7.7 6.5 - 8.1 g/dL   Albumin 4.2 3.5 - 5.0 g/dL   AST 28 15 - 41 U/L   ALT 24 0 - 44  U/L   Alkaline Phosphatase  103 38 - 126 U/L   Total Bilirubin 0.5 0.3 - 1.2 mg/dL   GFR calc non Af Amer >60 >60 mL/min   GFR calc Af Amer >60 >60 mL/min   Anion gap 11 5 - 15    Comment: Performed at [facility], [address] [place], [place] [address]  Ethanol     Status: Abnormal   Collection Time: 05/25/18  1:19 AM  Result Value Ref Range   Alcohol, Ethyl (B) 288 (H) <10 mg/dL    Comment: (NOTE) Lowest detectable limit for serum alcohol is 10 mg/dL. For medical purposes only. Performed at [facility], [address] [place], [place] [address]   Acetaminophen level     Status: Abnormal   Collection Time: 05/25/18  1:19 AM  Result Value Ref Range   Acetaminophen (Tylenol), Serum <10 (L) 10 - 30 ug/mL    Comment: (NOTE) Therapeutic concentrations vary significantly. A range of 10-30 ug/mL  may be an effective concentration for many patients. However, some  are best treated at concentrations outside of this range. Acetaminophen concentrations >150 ug/mL at 4 hours after ingestion  and >50 ug/mL at 12 hours after ingestion are often associated with  toxic reactions. Performed at [facility], [address] [place], [place] [address]   Salicylate level     Status: None   Collection Time: 05/25/18  1:19 AM  Result Value Ref Range   Salicylate Lvl <7.0 2.8 - 30.0 mg/dL    Comment: Performed at [facility], [address] [place], [place] [address]  Lipase, blood     Status: None   Collection Time: 05/25/18  1:19 AM  Result Value Ref Range   Lipase 48 11 - 51 U/L    Comment: Performed at [facility], [address] [place], [place] [address]    Blood Alcohol level:  Lab Results  Component Value Date   ETH 288 (H) 05/25/2018   ETH 392 (HH) 05/23/2018    Metabolic Disorder Labs:  Lab Results  Component Value Date   HGBA1C 5.9 (H) 10/27/2017   MPG 122.63 10/27/2017   MPG 119.76 08/14/2017   No results found for: PROLACTIN Lab Results   Component Value Date   TRIG 112 01/20/2018    Current Medications: Current Facility-Administered Medications  Medication Dose Route Frequency Provider Last Rate Last Dose  . acetaminophen (TYLENOL) tablet 650 mg  650 mg Oral Q6H PRN Luciano Cutter, DO      . cloNIDine (CATAPRES - Dosed in mg/24 hr) patch 0.1 mg  0.1 mg Transdermal Weekly Donata Clay R, DO      . cloNIDine (CATAPRES) tablet 0.1 mg  0.1 mg Oral Daily Donata Clay R, DO   0.1 mg at 05/25/18 1730  . dicyclomine (BENTYL) tablet 20 mg  20 mg Oral Q6H PRN Donata Clay R, DO      . hydrOXYzine (ATARAX/VISTARIL) tablet 25 mg  25 mg Oral Q6H PRN Donata Clay R, DO      . loperamide (IMODIUM) capsule 2-4 mg  2-4 mg Oral PRN Luciano Cutter, DO      . LORazepam (ATIVAN) tablet 1 mg  1 mg Oral Q6H PRN Luciano Cutter, DO      . LORazepam (ATIVAN) tablet 1 mg  1 mg Oral QID Donata Clay R, DO   1 mg at 05/25/18 1730   Followed by  . [START ON 05/27/2018] LORazepam (ATIVAN)  tablet 1 mg  1 mg Oral TID Luciano CutterSmith, Seham Gardenhire R, DO       Followed by  . [START ON 05/28/2018] LORazepam (ATIVAN) tablet 1 mg  1 mg Oral BID Donata ClaySmith, Talyssa Gibas R, DO       Followed by  . [START ON 05/29/2018] LORazepam (ATIVAN) tablet 1 mg  1 mg Oral Daily Luciano CutterSmith, Manvi Guilliams R, DO      . [START ON 05/26/2018] metFORMIN (GLUCOPHAGE) tablet 500 mg  500 mg Oral Q breakfast Luciano CutterSmith, Sharifah Champine R, DO      . methocarbamol (ROBAXIN) tablet 500 mg  500 mg Oral Q8H PRN Donata ClaySmith, Charlee Whitebread R, DO      . metoprolol succinate (TOPROL-XL) 24 hr tablet 25 mg  25 mg Oral Daily Donata ClaySmith, Letisha Yera R, DO   25 mg at 05/25/18 1730  . multivitamin with minerals tablet 1 tablet  1 tablet Oral Daily Donata ClaySmith, Kayo Zion R, DO   1 tablet at 05/25/18 1730  . ondansetron (ZOFRAN-ODT) disintegrating tablet 4 mg  4 mg Oral Q6H PRN Luciano CutterSmith, Maycen Degregory R, DO      . [START ON 05/26/2018] thiamine (VITAMIN B-1) tablet 100 mg  100 mg Oral Daily Donata ClaySmith, Abdulkareem Badolato R, DO       PTA Medications: Medications Prior to Admission  Medication Sig  Dispense Refill Last Dose  . B Complex-C (B-COMPLEX WITH VITAMIN C) tablet Take 1 tablet by mouth daily. 30 tablet 0 Past Month at Unknown time  . busPIRone (BUSPAR) 15 MG tablet Take 1 tablet (15 mg total) by mouth 2 (two) times daily. 60 tablet 0 Past Month at Unknown time  . DULoxetine (CYMBALTA) 30 MG capsule Take 30 mg by mouth daily at 6 (six) AM.   Past Month at Unknown time  . gabapentin (NEURONTIN) 300 MG capsule Take 2 capsules (600 mg total) by mouth 3 (three) times daily. 180 capsule 0 Past Month at Unknown time  . metFORMIN (GLUCOPHAGE) 500 MG tablet Take 1 tablet (500 mg total) by mouth daily. 30 tablet 0 Past Month at Unknown time  . metoprolol succinate (TOPROL-XL) 50 MG 24 hr tablet Take 1 tablet (50 mg total) by mouth daily. Take with or immediately following a meal. 30 tablet 0 Past Month at Unknown time  . multivitamin (ONE-A-DAY MEN'S) TABS tablet Take 1 tablet by mouth daily. 30 tablet 1 Past Month at Unknown time  . mycophenolate (CELLCEPT) 500 MG tablet Take 1 tablet (500 mg total) by mouth 2 (two) times daily. Take 1 tablet (500MG ) by mouth every morning and 2 tablets (1000MG ) by mouth every evening (Patient taking differently: Take 500-1,000 mg by mouth See admin instructions. 500 mg every morning and 1000 mg at bedtime) 90 tablet 0 Past Month at Unknown time  . oxyCODONE (OXY IR/ROXICODONE) 5 MG immediate release tablet Take 1 tablet (5 mg total) by mouth every 6 (six) hours as needed for severe pain. 12 tablet 0 Past Month at Unknown time  . senna-docusate (SENOKOT-S) 8.6-50 MG tablet Take 1 tablet by mouth at bedtime as needed for mild constipation. (Patient taking differently: Take 1 tablet by mouth daily. ) 30 tablet 0 Past Month at Unknown time  . testosterone cypionate (DEPOTESTOSTERONE CYPIONATE) 200 MG/ML injection Inject 200 mg into the muscle every 14 (fourteen) days.   Past Month at Unknown time  . thiamine 100 MG tablet Take 1 tablet (100 mg total) by mouth daily.  30 tablet 1 Past Month at Unknown time    Musculoskeletal: Strength & Muscle Tone:  within normal limits Gait & Station: broad based Patient leans: Right  Psychiatric Specialty Exam: Physical Exam  Constitutional: He is oriented to person, place, and time. He appears well-developed and well-nourished.  HENT:  Head: Normocephalic and atraumatic.  Eyes: EOM are normal.  Respiratory: Effort normal.  Musculoskeletal: Normal range of motion.  Neurological: He is alert and oriented to person, place, and time.    Review of Systems  Constitutional: Positive for malaise/fatigue. Negative for chills, fever and weight loss.  HENT: Negative for congestion, hearing loss, sinus pain, sore throat and tinnitus.   Eyes: Negative for blurred vision and double vision.  Respiratory: Positive for cough. Negative for shortness of breath and wheezing.   Cardiovascular: Negative for chest pain and palpitations.  Gastrointestinal: Negative for constipation, diarrhea, heartburn and nausea.  Genitourinary: Negative for dysuria.  Musculoskeletal: Positive for back pain.  Neurological: Positive for sensory change. Negative for dizziness, seizures, weakness and headaches.    Blood pressure (!) 164/105, pulse 80, temperature 98.1 F (36.7 C), temperature source Oral, resp. rate 18, height 5\' 11"  (1.803 m), weight 78.9 kg, SpO2 97 %.Body mass index is 24.27 kg/m.  General Appearance: Casual and Disheveled  Eye Contact:  Poor  Speech:  Clear and Coherent  Volume:  Normal  Mood:  "awful"  Affect:  Constricted and Tearful  Thought Process:  Coherent  Orientation:  Full (Time, Place, and Person)  Thought Content:  Logical  Suicidal Thoughts:  Yes.  without intent/plan  Homicidal Thoughts:  No  Memory:  Immediate;   Good Recent;   Good Remote;   Good  Judgement:  Impaired  Insight:  Shallow  Psychomotor Activity:  Normal  Concentration:  Concentration: Good  Recall:  Fair  Fund of Knowledge:  Fair   Language:  Fair  Akathisia:  No  Handed:  Right  AIMS (if indicated):     Assets:  Communication Skills Desire for Improvement Housing Social Support  ADL's:  Intact  Cognition:  WNL  Sleep:       Treatment Plan Summary: Daily contact with patient to assess and evaluate symptoms and progress in treatment and Medication management   Overall the patient appears to be a 58 year old man with severe alcohol use disorder, withdrawals, substance-induced mood disorder, and suicidal behavior.  We will involuntarily admit him to the inpatient psychiatric ward.  Considering his history and tolerance we will treat his alcohol withdrawals conservatively and place him on a taper with CIWA.  We will restart his home medical medications of metformin, metoprolol, CellCept and gabapentin.  We discussed the concern for withdrawals and seizures with serotonergic medications so we will hold off on those for now.  We also discussed his misuse of his pain medications, and the dangers of utilizing opiate medications along with alcohol.  He did not endorse enough symptoms to warrant a diagnosis of opiate use disorder, but we may need more information. He is aware that he will also be on a opiate withdrawal scale as it is unclear to the extent that he is using his prescriptions. He reports that he hopes he can go to a inpatient rehabilitation center after his detoxification.   Observation Level/Precautions:  Detox Fall 15 minute checks  Laboratory:  CBC Chemistry Profile Folic Acid HbAIC UDS UA Vitamin B-12  Psychotherapy:    Medications:    Consultations:    Discharge Concerns:    Estimated LOS:  Other:     Physician Treatment Plan  #Suicidal Behavior Continue admission to the  inpatient psychiatric ward 15-minute checks Will hold off on serotonergic medications at this time.  #Alcohol Withdrawal We will place on Ativan taper with CIWA Fall precautions Thiamine  #Alcohol use disorder The  patient stated he may be open to medications to help with cravings, but at this time would like to wait until he is done with detox.  Considering his chronic pain and difficulties with adherence acamprosate and naltrexone may not be effective.  Would consider topiramate, but would need to research its use in patients with a history of myasthenia gravis.  #Diabetes Metformin 500 mg p.o. daily  #Hypertension Metoprolol 24-hour tablet 25 mg p.o. daily  #History of Myasthenia Gravis CellCept 500 mg p.o. every morning and 1000 mg p.o. nightly   #Comfort Medications -Tylenol 650 mg p.o. every 6 hours as needed for pain Imodium 2 to 4 mg p.o. as needed diarrhea zofran 4 mg tablet p.o. every 6 hours for nausea Hydroxyzine 25 mg p.o. every 6 hours for anxiety Bentyl 20 mg p.o. every 6 hours for abdominal cramping Robaxin 500 mg p.o. every 8 hours for muscle cramps   Long Term Goal(s): Improvement in symptoms so as ready for discharge  Short Term Goals: Ability to identify changes in lifestyle to reduce recurrence of condition will improve, Ability to verbalize feelings will improve, Ability to disclose and discuss suicidal ideas, Ability to demonstrate self-control will improve, Ability to identify and develop effective coping behaviors will improve, Ability to maintain clinical measurements within normal limits will improve, Compliance with prescribed medications will improve and Ability to identify triggers associated with substance abuse/mental health issues will improve  I certify that inpatient services furnished can reasonably be expected to improve the patient's condition.    Luciano Cutter, DO 1/18/20207:31 PM

## 2018-05-25 NOTE — ED Notes (Signed)
Pt report given - the admit order needs to be placed in computer then [facility]

## 2018-05-25 NOTE — BH Assessment (Signed)
Patient is to be admitted [facility] by Dr. Katrinka BlazingSmith.  Attending Physician will be Dr. Jennet MaduroPucilowska.   Patient has been [facility] to room 305, by [facility] Charge Nurse Marylu LundJanet.   Intake Paper Work has been signed and placed on patient chart.  ER staff is aware of the admission:  Ronnie: ER Secretary    Dr. Shelbie HutchingSheavitz: ER MD   Amy: Patient's Nurse   Marylene LandAngela: Patient Access.

## 2018-05-25 NOTE — Progress Notes (Signed)
58 year old male presents with substance abuse and Suicidal attempt. Patient has superficial cuts to wrists. Reports drinking 20-30 rum airplane bottles daily. Patient also reports 2-3 oxycontin daily. Patient contributes his abuse to being away from kids and divorced from wife. Patient Denies SI at present and is seeking help to stop alcohol abuse. Pt endorses depression. Pt presents in person with tremors, is requesting narcotics for chronic back pain. Pt high fall risk reports falls at least monthly due to alcohol abuse. Skin and contraband search completed and witnessed by Aurther Loft, Charity fundraiser. Patient has abrasions to legs and arms as well as scattered bruising. Pt has blood blister to right 5th toe. 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 No contraband found.  Encouragement and support offered. Oriented patient to room and unit. Fluids and nutrition offered.Safety checks maintained. Pt receptive and remains safe on unit with q 15 min checks.

## 2018-05-25 NOTE — [facility] Suicide Risk Assessment (Signed)
[facility] Admission Suicide Risk Assessment   Nursing information obtained from:  Patient Demographic factors:  Male, Caucasian, Divorced or widowed Current Mental Status:  Suicidal ideation indicated by patient Loss Factors:  Loss of significant relationship Historical Factors:  NA Risk Reduction Factors:  Positive social support  Total Time spent with patient: 1.5 hours   Diagnosis:   Suicidal Behavior Substance induced mood disorder Alcohol use disorder, severe Alcohol Use Alcohol Withdrawal Diabetes Hypertension History of Myasthenia Gravis  Subjective Data: Patient is a 58 year old Caucasian male with a long history of severe alcohol use, currently admitted to the inpatient psychiatric ward as an involuntary admission for the safe evaluation and treatment of alcohol withdrawal, alcohol use disorder and suicidal behavior.  Continued Clinical Symptoms:  Alcohol Use Disorder Identification Test Final Score (AUDIT): 40 The "Alcohol Use Disorders Identification Test", Guidelines for Use in Primary Care, Second Edition.  World Science writer [facility]). Score between 0-7:  no or low risk or alcohol related problems. Score between 8-15:  moderate risk of alcohol related problems. Score between 16-19:  high risk of alcohol related problems. Score 20 or above:  warrants further diagnostic evaluation for alcohol dependence and treatment.   CLINICAL FACTORS:   Alcohol/Substance Abuse/Dependencies   Musculoskeletal: Strength & Muscle Tone: within normal limits Gait & Station: unsteady Patient leans: Right  Psychiatric Specialty Exam: Physical Exam  Constitutional: He is oriented to person, place, and time. He appears well-developed and well-nourished.  HENT:  Head: Normocephalic and atraumatic.  Eyes: EOM are normal.  Respiratory: Effort normal.  Musculoskeletal: Normal range of motion.  Neurological: He is alert and oriented to person, place, and time.    Review of Systems   Constitutional: Positive for malaise/fatigue. Negative for chills, fever and weight loss.  HENT: Negative for congestion, hearing loss, sinus pain, sore throat and tinnitus.   Eyes: Negative for blurred vision and double vision.  Respiratory: Positive for cough. Negative for shortness of breath and wheezing.   Cardiovascular: Negative for chest pain and palpitations.  Gastrointestinal: Negative for constipation, diarrhea, heartburn and nausea.  Genitourinary: Negative for dysuria.  Musculoskeletal: Positive for back pain.  Neurological: Positive for sensory change. Negative for dizziness, seizures, weakness and headaches.    Blood pressure (!) 164/105, pulse 80, temperature 98.1 F (36.7 C), temperature source Oral, resp. rate 18, height 5\' 11"  (1.803 m), weight 78.9 kg, SpO2 97 %.Body mass index is 24.27 kg/m.  General Appearance: Casual and Disheveled  Eye Contact:  Poor  Speech:  Clear and Coherent  Volume:  Normal  Mood:  "awful"  Affect:  Constricted and Tearful  Thought Process:  Coherent  Orientation:  Full (Time, Place, and Person)  Thought Content:  Logical  Suicidal Thoughts:  Yes.  without intent/plan  Homicidal Thoughts:  No  Memory:  Immediate;   Good Recent;   Good Remote;   Good  Judgement:  Impaired  Insight:  Shallow  Psychomotor Activity:  Normal  Concentration:  Concentration: Good  Recall:  Fair  Fund of Knowledge:  Fair  Language:  Fair  Akathisia:  No  Handed:  Right  AIMS (if indicated):     Assets:  Communication Skills Desire for Improvement Housing Social Support  ADL's:  Intact  Cognition:  WNL  Sleep:         COGNITIVE FEATURES THAT CONTRIBUTE TO RISK:  Closed-mindedness, Polarized thinking and Thought constriction (tunnel vision)    SUICIDE RISK:   Moderate:  Frequent suicidal ideation with limited intensity, and duration,  some specificity in terms of plans, no associated intent, good self-control, limited dysphoria/symptomatology,  some risk factors present, and identifiable protective factors, including available and accessible social support.  PLAN OF CARE: The patient is currently admitted involuntarily to the inpatient psychiatric ward for safety and treatment.  Please see his H&P for the full treatment plan.  I certify that inpatient services furnished can reasonably be expected to improve the patient's condition.   Luciano Cutter, DO 05/25/2018, 9:24 PM

## 2018-05-25 NOTE — ED Notes (Signed)
SOC called report given, pt. Taken to observation room to talk to [facility].

## 2018-05-26 LAB — ETHANOL: Alcohol, Ethyl (B): 263 mg/dL — ABNORMAL HIGH (ref <10)

## 2018-05-26 MED ORDER — GABAPENTIN 300 MG PO CAPS
300.0000 mg | ORAL_CAPSULE | Freq: Three times a day (TID) | ORAL | Status: DC
  Administered 2018-05-26 – 2018-05-29 (×10): 300 mg via ORAL
  Filled 2018-05-26 (×10): qty 1

## 2018-05-26 MED ORDER — NICOTINE 14 MG/24HR TD PT24
14.0000 mg | MEDICATED_PATCH | Freq: Every day | TRANSDERMAL | Status: DC
  Administered 2018-05-26 – 2018-05-30 (×5): 14 mg via TRANSDERMAL
  Filled 2018-05-26 (×5): qty 1

## 2018-05-26 NOTE — Progress Notes (Signed)
[facility] MD Progress Note  05/26/2018 12:46 PM Greggory Maben  MRN:  [id number]  Diagnosis:  Suicidal Behavior Alcohol Withdrawal Substance induced mood disorder Alcohol use disorder, severe Alcohol Use Diabetes Hypertension History of Myasthenia Gravis  Total Time spent with patient: 20 minutes  CC: "I am hanging in there"   Subjective: No acute events overnight All signs within normal limits Sleep 5.75 hours CIWA scores: 7, 9, 8 Medication compliant  Per chart review the patient's been compliant and pleasant with staff, appears to be tolerating his withdrawals.  Noted that he utilized Imodium last night.  The patient was interviewed in his room this morning.  He was found in the day room interacting with his peers and watching television.  He was able to ambulate to his room without any assistance but continues to display a wide-based gait.  He reported that his withdrawals are going "well", we reviewed his treatment plan and answered questions regarding withdrawing from opiates and alcohol simultaneously.  Would like to be placed back on gabapentin and have a nicotine patch available.  We discussed maintenance therapy for alcohol use disorder, however this may be more appropriate once the patient has finished his detox.  He appeared to be interested in utilizing topiramate, as acamprosate may be difficult to be adherent with, and naltrexone may interfere with his pain management.  He is currently remorseful for his suicidal behavior, but stated and he continues to have intermittent suicidal ideation.  Otherwise he denies any auditory visual hallucinations and does not appear to have any delusional ideation.  Past Psychiatric History: Per H&P He denies any past suicide attempts, he denies any outpatient psychiatric history.  He reports attending rehabilitation in [place] at [place].  He reports that he has been prescribed antidepressants by his primary care provider, and has been on Celexa  and Cymbalta.  He denies knowledge of other psychiatric medications.  Past Medical History:  Past Medical History:  Diagnosis Date  . A-fib (HCC)   . Alcohol abuse   . Alcohol withdrawal (HCC) 11/12/2016  . Diabetes mellitus without complication (HCC)   . DJD (degenerative joint disease)   . Hypertension   . Myasthenia gravis (HCC)   . Renal disorder     Past Surgical History:  Procedure Laterality Date  . AMPUTATION TOE Left 12/29/2015   Procedure: AMPUTATION TOE;  Surgeon: Gwyneth Revels, DPM;  Location: [facility] ORS;  Service: Podiatry;  Laterality: Left;  . ESOPHAGOGASTRODUODENOSCOPY N/A 02/05/2018   Procedure: ESOPHAGOGASTRODUODENOSCOPY (EGD);  Surgeon: Pasty Spillers, MD;  Location[facility]COPY;  Service: Endoscopy;  Laterality: N/A;  . PERIPHERAL VASCULAR CATHETERIZATION N/A 12/27/2015   Procedure: Lower Extremity Angiography;  Surgeon: Annice Needy, MD;  Location: [facility] INVASIVE CV LAB;  Service: Cardiovascular;  Laterality: N/A;  . right hip surgery    . right shoulder surgery     Family History:  Family History  Problem Relation Age of Onset  . Rheum arthritis Father    Family Psychiatric  History: Denies Social History:  Social History   Substance and Sexual Activity  Alcohol Use Yes  . Alcohol/week: 20.0 - 30.0 standard drinks  . Types: 20 - 30 Shots of liquor per week   Comment: Pt had 20 airplane bottles today     Social History   Substance and Sexual Activity  Drug Use Yes  . Types: Oxycodone    Social History   Socioeconomic History  . Marital status: Divorced    Spouse name: Not on file  .  Number of children: Not on file  . Years of education: Not on file  . Highest education level: Not on file  Occupational History  . Not on file  Social Needs  . Financial resource strain: Not on file  . Food insecurity:    Worry: Not on file    Inability: Not on file  . Transportation needs:    Medical: Not on file    Non-medical: Not on file  Tobacco  Use  . Smoking status: Current Every Day Smoker    Packs/day: 0.50    Types: Cigarettes  . Smokeless tobacco: Never Used  Substance and Sexual Activity  . Alcohol use: Yes    Alcohol/week: 20.0 - 30.0 standard drinks    Types: 20 - 30 Shots of liquor per week    Comment: Pt had 20 airplane bottles today  . Drug use: Yes    Types: Oxycodone  . Sexual activity: Not on file  Lifestyle  . Physical activity:    Days per week: Not on file    Minutes per session: Not on file  . Stress: Not on file  Relationships  . Social connections:    Talks on phone: Not on file    Gets together: Not on file    Attends religious service: Not on file    Active member of club or organization: Not on file    Attends meetings of clubs or organizations: Not on file    Relationship status: Not on file  Other Topics Concern  . Not on file  Social History Narrative   Live with Parents, ambulates with a cane. Falls lately.   Additional Social History:   Per H&P   Patient reports that he was born in [place].  He states that his childhood was "fine".  He reports that he is in the middle of his siblings he has an older brother and a younger brother along with a younger sister.  Stated that his education was "okay".  He went to college for computer science and obtained his MBA.  He was married at the age of 2 and has 2 children.  He is divorced 3 years ago.  His previous career was as a Best boy.  He has been living with his parents since the summer 2017.                      Sleep: Good  Appetite:  Fair  Current Medications: Current Facility-Administered Medications  Medication Dose Route Frequency Provider Last Rate Last Dose  . acetaminophen (TYLENOL) tablet 650 mg  650 mg Oral Q6H PRN Luciano Cutter, DO   650 mg at 05/26/18 1221  . cloNIDine (CATAPRES - Dosed in mg/24 hr) patch 0.1 mg  0.1 mg Transdermal Weekly Donata Clay R, DO      . cloNIDine (CATAPRES) tablet 0.1 mg   0.1 mg Oral Daily Donata Clay R, DO   0.1 mg at 05/26/18 0834  . dicyclomine (BENTYL) tablet 20 mg  20 mg Oral Q6H PRN Donata Clay R, DO      . hydrOXYzine (ATARAX/VISTARIL) tablet 25 mg  25 mg Oral Q6H PRN Donata Clay R, DO   25 mg at 05/26/18 1610  . loperamide (IMODIUM) capsule 2-4 mg  2-4 mg Oral PRN Donata Clay R, DO   4 mg at 05/25/18 2054  . LORazepam (ATIVAN) tablet 1 mg  1 mg Oral Q6H PRN Luciano Cutter, DO      .  LORazepam (ATIVAN) tablet 1 mg  1 mg Oral QID Donata ClaySmith, Izzabell Klasen R, DO   1 mg at 05/26/18 1221   Followed by  . [START ON 05/27/2018] LORazepam (ATIVAN) tablet 1 mg  1 mg Oral TID Luciano CutterSmith, Tangy Drozdowski R, DO       Followed by  . [START ON 05/28/2018] LORazepam (ATIVAN) tablet 1 mg  1 mg Oral BID Donata ClaySmith, Kazmir Oki R, DO       Followed by  . [START ON 05/29/2018] LORazepam (ATIVAN) tablet 1 mg  1 mg Oral Daily Donata ClaySmith, Tilia Faso R, DO      . metFORMIN (GLUCOPHAGE) tablet 500 mg  500 mg Oral Q breakfast Donata ClaySmith, Elantra Caprara R, DO   500 mg at 05/26/18 0834  . methocarbamol (ROBAXIN) tablet 500 mg  500 mg Oral Q8H PRN Donata ClaySmith, Marion Seese R, DO   500 mg at 05/26/18 [id number]  . metoprolol succinate (TOPROL-XL) 24 hr tablet 25 mg  25 mg Oral Daily Donata ClaySmith, Onyx Edgley R, DO   25 mg at 05/26/18 0834  . multivitamin with minerals tablet 1 tablet  1 tablet Oral Daily Donata ClaySmith, Ashaki Frosch R, DO   1 tablet at 05/26/18 [id number]  . mycophenolate (CELLCEPT) capsule 500 mg  500 mg Oral Daily Donata ClaySmith, Kaliope Quinonez R, DO   500 mg at 05/26/18 [id number]   And  . mycophenolate (CELLCEPT) capsule 1,000 mg  1,000 mg Oral QHS Donata ClaySmith, Kaeley Vinje R, DO   1,000 mg at 05/25/18 2230  . ondansetron (ZOFRAN-ODT) disintegrating tablet 4 mg  4 mg Oral Q6H PRN Luciano CutterSmith, Aris Even R, DO      . thiamine (VITAMIN B-1) tablet 100 mg  100 mg Oral Daily Donata ClaySmith, Lakitha Gordy R, DO   100 mg at 05/26/18 [id number]    Lab Results:  Results for orders placed or performed during the hospital encounter of 05/25/18 (from the past 48 hour(s))  CBC with Differential     Status: Abnormal   Collection Time:  05/25/18  1:19 AM  Result Value Ref Range   WBC 6.0 4.0 - 10.5 K/uL   RBC 5.35 4.22 - 5.81 MIL/uL   Hemoglobin 14.1 13.0 - 17.0 g/dL   HCT 40.344.0 47.439.0 - 25.952.0 %   MCV 82.2 80.0 - 100.0 fL   MCH 26.4 26.0 - 34.0 pg   MCHC 32.0 30.0 - 36.0 g/dL   RDW 56.321.2 (H) 87.511.5 - 64.315.5 %   Platelets 298 150 - 400 K/uL   nRBC 0.0 0.0 - 0.2 %   Neutrophils Relative % 52 %   Neutro Abs 3.1 1.7 - 7.7 K/uL   Lymphocytes Relative 35 %   Lymphs Abs 2.1 0.7 - 4.0 K/uL   Monocytes Relative 11 %   Monocytes Absolute 0.7 0.1 - 1.0 K/uL   Eosinophils Relative 0 %   Eosinophils Absolute 0.0 0.0 - 0.5 K/uL   Basophils Relative 1 %   Basophils Absolute 0.1 0.0 - 0.1 K/uL   WBC Morphology MORPHOLOGY UNREMARKABLE    RBC Morphology MORPHOLOGY UNREMARKABLE    Smear Review Normal platelet morphology    Immature Granulocytes 1 %   Abs Immature Granulocytes 0.05 0.00 - 0.07 K/uL    Comment: Performed at [facility], [address] [place], [place] [address]  Comprehensive metabolic panel     Status: Abnormal   Collection Time: 05/25/18  1:19 AM  Result Value Ref Range   Sodium 139 135 - 145 mmol/L   Potassium 3.3 (L) 3.5 - 5.1 mmol/L   Chloride 102 98 - 111  mmol/L   CO2 26 22 - 32 mmol/L   Glucose, Bld 127 (H) 70 - 99 mg/dL   BUN 5 (L) 6 - 20 mg/dL   Creatinine, Ser 5.780.65 0.61 - 1.24 mg/dL   Calcium 8.8 (L) 8.9 - 10.3 mg/dL   Total Protein 7.7 6.5 - 8.1 g/dL   Albumin 4.2 3.5 - 5.0 g/dL   AST 28 15 - 41 U/L   ALT 24 0 - 44 U/L   Alkaline Phosphatase 103 38 - 126 U/L   Total Bilirubin 0.5 0.3 - 1.2 mg/dL   GFR calc non Af Amer >60 >60 mL/min   GFR calc Af Amer >60 >60 mL/min   Anion gap 11 5 - 15    Comment: Performed at [facility], [address] [place], [place] [address]  Ethanol     Status: Abnormal   Collection Time: 05/25/18  1:19 AM  Result Value Ref Range   Alcohol, Ethyl (B) 263 (H) <10 mg/dL    Comment: CORRECTED RESULTS (NOTE) Lowest detectable limit for serum alcohol  is 10 mg/dL. For medical purposes only. Performed at [facility], [address] [place], [place] [address] CORRECTED ON 01/19 AT 1034: PREVIOUSLY REPORTED AS 288   Acetaminophen level     Status: Abnormal   Collection Time: 05/25/18  1:19 AM  Result Value Ref Range   Acetaminophen (Tylenol), Serum <10 (L) 10 - 30 ug/mL    Comment: (NOTE) Therapeutic concentrations vary significantly. A range of 10-30 ug/mL  may be an effective concentration for many patients. However, some  are best treated at concentrations outside of this range. Acetaminophen concentrations >150 ug/mL at 4 hours after ingestion  and >50 ug/mL at 12 hours after ingestion are often associated with  toxic reactions. Performed at [facility], [address] [place], [place] [address]   Salicylate level     Status: None   Collection Time: 05/25/18  1:19 AM  Result Value Ref Range   Salicylate Lvl <7.0 2.8 - 30.0 mg/dL    Comment: Performed at [facility], [address] [place], [place] [address]  Lipase, blood     Status: None   Collection Time: 05/25/18  1:19 AM  Result Value Ref Range   Lipase 48 11 - 51 U/L    Comment: Performed at [facility], [address] [place], [place] [address]    Blood Alcohol level:  Lab Results  Component Value Date   ETH 263 (H) 05/25/2018   ETH 392 (HH) 05/23/2018    Metabolic Disorder Labs: Lab Results  Component Value Date   HGBA1C 5.9 (H) 10/27/2017   MPG 122.63 10/27/2017   MPG 119.76 08/14/2017   No results found for: PROLACTIN Lab Results  Component Value Date   TRIG 112 01/20/2018    Physical Findings: AIMS: Facial and Oral Movements Muscles of Facial Expression: None, normal Lips and Perioral Area: None, normal Jaw: None, normal Tongue: None, normal,Extremity Movements Upper (arms, wrists, hands, fingers): None, normal Lower (legs, knees, ankles, toes): None, normal, Trunk Movements Neck, shoulders,  hips: None, normal, Overall Severity Severity of abnormal movements (highest score from questions above): None, normal Incapacitation due to abnormal movements: None, normal Patient's awareness of abnormal movements (rate only patient's report): No Awareness, Dental Status Current problems with teeth and/or dentures?: No Does patient usually wear dentures?: No  CIWA:  CIWA-Ar Total: 1 COWS:  COWS Total Score: 3  Musculoskeletal: Strength & Muscle Tone: within normal  limits Gait & Station: broad based Patient leans: Right  Psychiatric Specialty Exam: Physical Exam  Constitutional: He is oriented to person, place, and time. He appears well-developed and well-nourished.  HENT:  Head: Normocephalic and atraumatic.  Eyes: EOM are normal.  Respiratory: Effort normal.  Musculoskeletal: Normal range of motion.  Neurological: He is alert and oriented to person, place, and time.    Review of Systems  Constitutional: Positive for malaise/fatigue. Negative for chills, fever and weight loss.  HENT: Negative for congestion, hearing loss, sinus pain, sore throat and tinnitus.   Eyes: Negative for blurred vision and double vision.  Respiratory: Positive for cough. Negative for shortness of breath and wheezing.   Cardiovascular: Negative for chest pain and palpitations.  Gastrointestinal: Negative for constipation, diarrhea, heartburn and nausea.  Genitourinary: Negative for dysuria.  Musculoskeletal: Positive for back pain.  Neurological: Positive for sensory change. Negative for dizziness, seizures, weakness and headaches.    Blood pressure (!) 129/91, pulse 87, temperature 98.5 F (36.9 C), temperature source Oral, resp. rate 18, height 5\' 11"  (1.803 m), weight 78.9 kg, SpO2 97 %.Body mass index is 24.27 kg/m.  General Appearance: Casual and Disheveled  Eye Contact:  Poor  Speech:  Clear and Coherent  Volume:  Normal  Mood:  "beter"  Affect:  Constricted   Thought Process:  Coherent   Orientation:  Full (Time, Place, and Person)  Thought Content:  Logical  Suicidal Thoughts:  Yes.  without intent/plan  Homicidal Thoughts:  No  Memory:  Immediate;   Good Recent;   Good Remote;   Good  Judgement:  Impaired  Insight:  Shallow  Psychomotor Activity:  Normal  Concentration:  Concentration: Good  Recall:  Fair  Fund of Knowledge:  Fair  Language:  Fair  Akathisia:  No  Handed:  Right  AIMS (if indicated):     Assets:  Communication Skills Desire for Improvement Housing Social Support  ADL's:  Intact  Cognition:  WNL  Sleep:   5.75     Treatment Plan Summary: #Suicidal Behavior Continue admission to the inpatient psychiatric ward 15-minute checks Will hold off on serotonergic medications at this time.  #Alcohol Withdrawal  Ativan taper with CIWA Fall precautions Thiamine  #Alcohol use disorder The patient stated he may be open to medications to help with cravings, but at this time would like to wait until he is done with detox.  Considering his chronic pain and difficulties with adherence acamprosate and naltrexone may not be effective.  Would consider topiramate, but would need to research its use in patients with a history of myasthenia gravis.  #Diabetes Metformin 500 mg p.o. daily Gabapentin 300 mg p.o. 3 times daily  #tobacco use NicoDerm patch 14 mg daily, remind patient to remove at night  #Hypertension Metoprolol 24-hour tablet 25 mg p.o. daily  #History of Myasthenia Gravis CellCept 500 mg p.o. every morning and 1000 mg p.o. nightly   #Comfort Medications -Tylenol 650 mg p.o. every 6 hours as needed for pain Imodium 2 to 4 mg p.o. as needed diarrhea zofran 4 mg tablet p.o. every 6 hours for nausea Hydroxyzine 25 mg p.o. every 6 hours for anxiety Bentyl 20 mg p.o. every 6 hours for abdominal cramping Robaxin 500 mg p.o. every 8 hours for muscle cramps  #Disposition To be determined with his primary psychiatric team  Luciano Cutter, DO 05/26/2018, 12:46 PM

## 2018-05-26 NOTE — BHH Counselor (Signed)
Adult Comprehensive Assessment  Patient ID: Kurt Horton, male   DOB: Jun 04, 1960, 58 y.o.   MRN: [id number]  Information Source: Information source: Patient  Current Stressors:  Patient states their primary concerns and needs for treatment are:: "I was drunk and in a lot of pain so I tried to kill myself? Patient states their goals for this hospitilization and ongoing recovery are:: "be happy" Educational / Learning stressors: none reported Employment / Job issues: none reported Family Relationships: "goodEngineer, petroleum / Lack of resources (include bankruptcy): disability Housing / Lack of housing: statble Social relationships: "good" Substance abuse: ETOH Bereavement / Loss: divorced 3 yeara ago  Living/Environment/Situation:  Living Arrangements: Parent Who else lives in the home?: parents  Family History:  Marital status: Divorced Divorced, when?: 2017 Are you sexually active?: No What is your sexual orientation?: hetersosexual Has your sexual activity been affected by drugs, alcohol, medication, or emotional stress?: yes, alcohol Does patient have children?: Yes How many children?: 2 How is patient's relationship with their children?: "strange"  Childhood History:  By whom was/is the patient raised?: Adoptive parents Description of patient's relationship with caregiver when they were a child: "good" Patient's description of current relationship with people who raised him/her: "good" How were you disciplined when you got in trouble as a child/adolescent?: grounded Does patient have siblings?: Yes Number of Siblings: 3 Description of patient's current relationship with siblings: "good" Did patient suffer any verbal/emotional/physical/sexual abuse as a child?: No Did patient suffer from severe childhood neglect?: No Has patient ever been sexually abused/assaulted/raped as an adolescent or adult?: No Was the patient ever a victim of a crime or a disaster?: No Witnessed  domestic violence?: No  Education:  Highest grade of school patient has completed: MBA Currently a Consulting civil engineer?: No Learning disability?: No  Employment/Work Situation:   Employment situation: On disability Why is patient on disability: medical problems How long has patient been on disability: 2017 Patient's job has been impacted by current illness: No What is the longest time patient has a held a job?: 12 years Where was the patient employed at that time?: [employer] Did You Receive Any Psychiatric Treatment/Services While in the [employer]?: No Are There Guns or Other Weapons in Your Home?: No  Financial Resources:   Financial resources: Insurance claims handler Does patient have a Lawyer or guardian?: No  Alcohol/Substance Abuse:   What has been your use of drugs/alcohol within the last 12 months?: ETOH- daily 20 small bottles of liquor daily If attempted suicide, did drugs/alcohol play a role in this?: Yes Alcohol/Substance Abuse Treatment Hx: Past Tx, Inpatient, Past Tx, Outpatient, Attends AA/NA Has alcohol/substance abuse ever caused legal problems?: No  Social Support System:   Conservation officer, nature Support System: Fair Development worker, community Support System: parents Type of faith/religion: Ephriam Knuckles How does patient's faith help to cope with current illness?: n/a  Leisure/Recreation:   Leisure and Hobbies: "I don't know"  Strengths/Needs:   What is the patient's perception of their strengths?: "I don't know" Patient states they can use these personal strengths during their treatment to contribute to their recovery: "I don't know" Patient states these barriers may affect/interfere with their treatment: "I don't know" Patient states these barriers may affect their return to the community: "I don't know"  Discharge Plan:   Currently receiving community mental health services: No Patient states concerns and preferences for aftercare planning are: "I don't know" Does  patient have access to transportation?: No Plan for no access to transportation at discharge: TBD  with CSW Plan for living situation after discharge: TBD with SW Will patient be returning to same living situation after discharge?: No  Summary/Recommendations:  Patient is a 58 year old male admitted involuntarily and diagnosed with Substance induced mood disorder and Alcohol use disorder, severe Who is currently involuntarily admitted to the inpatient psychiatric ward for recent suicidal behavior, and alcohol withdrawal?  The patient has had 12 admissions to the medical service at this facility for alcohol detox in the past year.  His last admission was from January 9 through January 14, at which point he was discharged to his parents' house.  At that time notes reflect that he was alert and conversing freely, he was given Librium and discharged back to his parents' house after he declined detox.  He reports returning to his parents' home, and again relapsing on alcohol.  Patient will benefit from crisis stabilization, medication evaluation, group therapy and psychoeducation. In addition to case management for discharge planning. At discharge it is recommended that patient adhere to the established discharge plan and continue treatment.     Amr Sturtevant  CUEBAS-COLON. 05/26/2018

## 2018-05-26 NOTE — Progress Notes (Signed)
D: Pt denies SI/HI/AVH, can contract for safety. Pt is pleasant and cooperative, engaging appropriately. Pt. Reports muscle spasms all throughout his back, given PRN medications for this. Pt. Endorses a mostly depressed and anxious mood. Pt. Rates his depression and anxiety at, "5/10" for both. Pt. Attending all meals, eating good. Pt. Mostly isolative and withdrawn to room when not watching tv in day room.     A: Q x 15 minute observation checks were completed for safety. Patient was provided with education. Patient was given/offered medications per orders. Patient  was encourage to attend groups, participate in unit activities and continue with plan of care. Pt. Chart and plans of care reviewed. Pt. Given support and encouragement.   R: Patient is complaint with medication and unit procedures. Pt. Participation with groups is absent/poor. Pt. Monitored per CIWA and COW scoring. Pt. Wants a cane to ambulate with, but adviced canes are not allowed on the unit. Pt. Ambulating with a steady gait, but slow. Pt. Educated he can utilize front wheel walker if needed, but declines to do so. Pt. Pain being addressed utilizing MD orders. Pt. Self-inventory inconsistent with this writer's assessments and conversations.              Precautionary checks every 15 minutes for safety maintained, room free of safety hazards, patient sustains no injury or falls during this shift. Will endorse care to next shift.

## 2018-05-26 NOTE — Plan of Care (Signed)
Pt. Is complaint with medications. Pt. Denies si/hi/avh, can contract for safety. Pt. Reports he can remain safe while on the unit. Pt. Reports doing better today. Pt. Participation with groups poor, but is visible around the milieu watching tv often.   Problem: Health Behavior/Discharge Planning: Goal: Compliance with treatment plan for underlying cause of condition will improve Outcome: Progressing   Problem: Safety: Goal: Ability to remain free from injury will improve Outcome: Progressing   Problem: Self-Concept: Goal: Ability to disclose and discuss suicidal ideas will improve Outcome: Progressing Goal: Will verbalize positive feelings about self Outcome: Progressing   Problem: Activity: Goal: Interest or engagement in activities will improve Outcome: Not Progressing

## 2018-05-26 NOTE — Plan of Care (Signed)
Visible and active in the milieu. Pleasant with sense of humor. Compliant with treatment

## 2018-05-26 NOTE — [facility] Group Notes (Signed)
LCSW Group Therapy Note 05/26/2018 1:15pm  Type of Therapy and Topic: Group Therapy: Feelings Around Returning Home & Establishing a Supportive Framework and Supporting Oneself When Supports Not Available  Participation Level: Did Not Attend  Description of Group:  Patients first processed thoughts and feelings about upcoming discharge. These included fears of upcoming changes, lack of change, new living environments, judgements and expectations from others and overall stigma of mental health issues. The group then discussed the definition of a supportive framework, what that looks and feels like, and how do to discern it from an unhealthy non-supportive network. The group identified different types of supports as well as what to do when your family/friends are less than helpful or unavailable  Therapeutic Goals  1. Patient will identify one healthy supportive network that they can use at discharge. 2. Patient will identify one factor of a supportive framework and how to tell it from an unhealthy network. 3. Patient able to identify one coping skill to use when they do not have positive supports from others. 4. Patient will demonstrate ability to communicate their needs through discussion and/or role plays.  Summary of Patient Progress:  Pt was invited to attend group but chose not to attend. CSW will continue to encourage pt to attend group throughout their admission.   Therapeutic Modalities Cognitive Behavioral Therapy Motivational Interviewing   Elani Delph  CUEBAS-COLON, LCSW 05/26/2018 9:14 AM

## 2018-05-26 NOTE — Plan of Care (Signed)
Patient newly admitted, hasn't had time to progress.   Problem: Education: Goal: Knowledge of [facility] Education information/materials will improve Outcome: Not Progressing Goal: Emotional status will improve Outcome: Not Progressing Goal: Mental status will improve Outcome: Not Progressing Goal: Verbalization of understanding the information provided will improve Outcome: Not Progressing   Problem: Activity: Goal: Interest or engagement in activities will improve Outcome: Not Progressing Goal: Sleeping patterns will improve Outcome: Not Progressing   Problem: Coping: Goal: Ability to verbalize frustrations and anger appropriately will improve Outcome: Not Progressing Goal: Ability to demonstrate self-control will improve Outcome: Not Progressing   Problem: Health Behavior/Discharge Planning: Goal: Compliance with treatment plan for underlying cause of condition will improve Outcome: Not Progressing   Problem: Physical Regulation: Goal: Ability to maintain clinical measurements within normal limits will improve Outcome: Not Progressing   Problem: Safety: Goal: Periods of time without injury will increase Outcome: Not Progressing   Problem: Education: Goal: Knowledge of disease or condition will improve Outcome: Not Progressing Goal: Understanding of discharge needs will improve Outcome: Not Progressing   Problem: Health Behavior/Discharge Planning: Goal: Ability to identify changes in lifestyle to reduce recurrence of condition will improve Outcome: Not Progressing   Problem: Physical Regulation: Goal: Complications related to the disease process, condition or treatment will be avoided or minimized Outcome: Not Progressing   Problem: Safety: Goal: Ability to remain free from injury will improve Outcome: Not Progressing   Problem: Education: Goal: Ability to make informed decisions regarding treatment will improve Outcome: Not Progressing   Problem:  Coping: Goal: Coping ability will improve Outcome: Not Progressing   Problem: Medication: Goal: Compliance with prescribed medication regimen will improve Outcome: Not Progressing   Problem: Self-Concept: Goal: Ability to disclose and discuss suicidal ideas will improve Outcome: Not Progressing Goal: Will verbalize positive feelings about self Outcome: Not Progressing

## 2018-05-26 NOTE — [facility] Group Notes (Signed)
[facility] Group Notes:  (Nursing/MHT/Case Management/Adjunct)  Date:  05/26/2018  Time:  9:00 PM  Type of Therapy:  Group Therapy  Participation Level:  Active  Participation Quality:  Appropriate and Talking about his room cold and he cold  Affect:  Appropriate  Cognitive:  Appropriate  Insight:  Good  Engagement in Group:  Engaged  Modes of Intervention:  Support  Summary of Progress/Problems:  Mayra Neer 05/26/2018, 9:00 PM

## 2018-05-26 NOTE — Progress Notes (Signed)
D - Patient was in the day room upon arrival to the unit. Patient was pleasant during assessment and medication administration. Patient denies SI/HI/AVH. Patient rated depression and anxiety 6/10. Patient denies pain at this time. Patient was seen acting appropriately with staff and peers on the unit by this Clinical research associate.  A - Patient was compliant with medication administration per MD orders. Patient given education. Patient given support and encouragement to be active in his treatment plan. Patient informed to let staff know if there are any issues or problems on the unit.   R - Patient verbally contracts for safety with this Clinical research associate. Patient being monitored Q 15 minutes for safety per unit protocol. Patient remains safe at this time.

## 2018-05-27 ENCOUNTER — Encounter: Payer: Self-pay | Admitting: Psychiatry

## 2018-05-27 DIAGNOSIS — F332 Major depressive disorder, recurrent severe without psychotic features: Principal | ICD-10-CM

## 2018-05-27 DIAGNOSIS — F17200 Nicotine dependence, unspecified, uncomplicated: Secondary | ICD-10-CM | POA: Diagnosis present

## 2018-05-27 MED ORDER — GUAIFENESIN 100 MG/5ML PO SOLN
5.0000 mL | ORAL | Status: DC | PRN
  Filled 2018-05-27: qty 5

## 2018-05-27 MED ORDER — BENZONATATE 100 MG PO CAPS
100.0000 mg | ORAL_CAPSULE | Freq: Three times a day (TID) | ORAL | Status: DC
  Administered 2018-05-27 – 2018-05-30 (×10): 100 mg via ORAL
  Filled 2018-05-27 (×14): qty 1

## 2018-05-27 NOTE — Progress Notes (Signed)
Recreation Therapy Notes  Date: 05/27/2018  Time: 9:30 am  Location: Craft Room  Behavioral response: Appropriate  Intervention Topic: Self- care  Discussion/Intervention:  Group content today was focused on Self-Care. The group defined self-care and some positive ways they care for themselves. Individuals expressed ways and reasons why they neglected any self-care in the past. Patients described ways to improve self-care in the future. The group explained what could happen if they did not do any self-care activities at all. The group participated in the intervention "self-care assessment" where they had a chance to discover some of their weaknesses and strengths in self- care. Patient came up with a self-care plan to improve themselves in the future.  Clinical Observations/Feedback:  Patient came to group late and defined self-care as good hygiene. He stated that a reason he has neglected his self-care is because he was intoxicated. Participant was pulled from group by peer support and never returned.    Chardai Gangemi LRT/CTRS           Andrena Margerum 05/27/2018 10:55 AM

## 2018-05-27 NOTE — Progress Notes (Signed)
At the beginning of the shift, patient was in bed sleeping, snoring. Appeared to be having difficulty breathing. Upon assessment, patient reported that he usually snores and has not have any major complications from snoring. Went to the dayroom and stayed  with peers. Had medications and snack and remained pleasant with sense of humor. No sign of distress. Patient received medications and slept throughout the night without any sign of distress.

## 2018-05-27 NOTE — [facility] Suicide Risk Assessment (Signed)
[facility] INPATIENT:  Family/Significant Other Suicide Prevention Education  Suicide Prevention Education:  Education Completed; Khristian Boland, father, [phone] has been identified by the patient as the family member/significant other with whom the patient will be residing, and identified as the person(s) who will aid the patient in the event of a mental health crisis (suicidal ideations/suicide attempt).  With written consent from the patient, the family member/significant other has been provided the following suicide prevention education, prior to the and/or following the discharge of the patient.  The suicide prevention education provided includes the following:  Suicide risk factors  Suicide prevention and interventions  National Suicide Hotline telephone number  [facility] assessment telephone number  [facility] Emergency Assistance 911  [facility] and/or Residential Mobile Crisis Unit telephone number  Request made of family/significant other to:  Remove weapons (e.g., guns, rifles, knives), all items previously/currently identified as safety concern.    Remove drugs/medications (over-the-counter, prescriptions, illicit drugs), all items previously/currently identified as a safety concern.  The family member/significant other verbalizes understanding of the suicide prevention education information provided.  The family member/significant other agrees to remove the items of safety concern listed above.  Father reports concerns regarding the client's "serious alcohol problems". Father reports "he has significant medical issues, diabetes, issues with his back, his eyes, he really has a dual problem the addiction and the physical problems that cause him a lot of pain.  That's why he says he has to drink so much." Father reports "he never gets enough pain medicine to relinquish the pain".  Father confirms that the patient does not have access to weapons.    Harden Mo 05/27/2018, 1:06 PM

## 2018-05-27 NOTE — Progress Notes (Signed)
D: Pt denies SI/HI/AVH, can contract for safety. Pt is pleasant and cooperative, engages appropriately. Pt. Reports a mostly normal mood, but endorses depression and anxiety also at a 7 and 4 respectively. Pt. Pain being addressed utilizing MD orders. Pt. Self inventory inconsistent with assessment with this Clinical research associate. Pt. Affect is appropriate.    A: Q x 15 minute observation checks were completed for safety. Patient was provided with education, but needs reinforcement. Patient was given/offered medications per orders. Patient  was encourage to attend groups, participate in unit activities and continue with plan of care. Pt. Chart and plans of care reviewed. Pt. Given support and encouragement.   R: Patient is complaint with medication and unit procedures with direction and encouragement. Pt. Attends all meals, but tends to isolate and withdraw to his room to sleep often. Pt. Group and activities attendance is frequently poor. Pt. Seen by PT. Spoke with PT and they recommend front wheel walker, but patient continues to decline to use on unit. Front wheel walker will remain available if patient wants to use for safety. Pt. Ambulating around the unit appropriately, just slow at times and shuffles his feet. Pt. Monitored utilizing CIWA and COW scoring per MD orders.             Precautionary checks every 15 minutes for safety maintained, room free of safety hazards, patient sustains no injury or falls during this shift. Will endorse care to next shift.

## 2018-05-27 NOTE — Progress Notes (Addendum)
[facility] MD Progress Note  05/27/2018 9:55 AM Kurt Horton  MRN:  [id number]  Subjective:    Kurt Horton reports mild symptoms of alcohol withdrawal, VS are stable. Sleep is fair. He is able to eat. He complains of pain "all over" and cough especially at night that wakes him up of 2 week duration. He tolerates medications well.  Unfortunately, the patient does not want to engage in any kind os substance abuse treatment program even though he claims he wants to stop drinking. He still feels down and suicidal as he is such a terrible disappointment to his aging parents. He is able to contract for safety.in the hospital.   Principal Problem: Major depressive disorder, recurrent severe without psychotic features (HCC) Diagnosis: Principal Problem:   Major depressive disorder, recurrent severe without psychotic features (HCC) Active Problems:   Type II diabetes mellitus with manifestations (HCC)   Myasthenia gravis (HCC)   HTN (hypertension)   Alcohol withdrawal (HCC)   Alcohol use disorder, severe, dependence (HCC)   Tobacco use disorder  Total Time spent with patient: 20 minutes  Past Psychiatric History: alcoholism, mood instability  Past Medical History:  Past Medical History:  Diagnosis Date  . A-fib (HCC)   . Alcohol abuse   . Alcohol withdrawal (HCC) 11/12/2016  . Diabetes mellitus without complication (HCC)   . DJD (degenerative joint disease)   . Hypertension   . Myasthenia gravis (HCC)   . Renal disorder     Past Surgical History:  Procedure Laterality Date  . AMPUTATION TOE Left 12/29/2015   Procedure: AMPUTATION TOE;  Surgeon: Gwyneth Revels, DPM;  Location: [facility] ORS;  Service: Podiatry;  Laterality: Left;  . ESOPHAGOGASTRODUODENOSCOPY N/A 02/05/2018   Procedure: ESOPHAGOGASTRODUODENOSCOPY (EGD);  Surgeon: Pasty Spillers, MD;  Location: [facility];  Service: Endoscopy;  Laterality: N/A;  . PERIPHERAL VASCULAR CATHETERIZATION N/A 12/27/2015   Procedure: Lower  Extremity Angiography;  Surgeon: Annice Needy, MD;  Location: [facility] INVASIVE CV LAB;  Service: Cardiovascular;  Laterality: N/A;  . right hip surgery    . right shoulder surgery     Family History:  Family History  Problem Relation Age of Onset  . Rheum arthritis Father    Family Psychiatric  History: none Social History:  Social History   Substance and Sexual Activity  Alcohol Use Yes  . Alcohol/week: 20.0 - 30.0 standard drinks  . Types: 20 - 30 Shots of liquor per week   Comment: Pt had 20 airplane bottles today     Social History   Substance and Sexual Activity  Drug Use Yes  . Types: Oxycodone    Social History   Socioeconomic History  . Marital status: Divorced    Spouse name: Not on file  . Number of children: Not on file  . Years of education: Not on file  . Highest education level: Not on file  Occupational History  . Not on file  Social Needs  . Financial resource strain: Not on file  . Food insecurity:    Worry: Not on file    Inability: Not on file  . Transportation needs:    Medical: Not on file    Non-medical: Not on file  Tobacco Use  . Smoking status: Current Every Day Smoker    Packs/day: 0.50    Types: Cigarettes  . Smokeless tobacco: Never Used  Substance and Sexual Activity  . Alcohol use: Yes    Alcohol/week: 20.0 - 30.0 standard drinks    Types: 20 -  30 Shots of liquor per week    Comment: Pt had 20 airplane bottles today  . Drug use: Yes    Types: Oxycodone  . Sexual activity: Not on file  Lifestyle  . Physical activity:    Days per week: Not on file    Minutes per session: Not on file  . Stress: Not on file  Relationships  . Social connections:    Talks on phone: Not on file    Gets together: Not on file    Attends religious service: Not on file    Active member of club or organization: Not on file    Attends meetings of clubs or organizations: Not on file    Relationship status: Not on file  Other Topics Concern  . Not on  file  Social History Narrative   Live with Parents, ambulates with a cane. Falls lately.   Additional Social History:                         Sleep: Fair  Appetite:  Fair  Current Medications: Current Facility-Administered Medications  Medication Dose Route Frequency Provider Last Rate Last Dose  . acetaminophen (TYLENOL) tablet 650 mg  650 mg Oral Q6H PRN Luciano CutterSmith, Justin R, DO   650 mg at 05/27/18 0810  . benzonatate (TESSALON) capsule 100 mg  100 mg Oral TID Moksh Loomer B, MD      . cloNIDine (CATAPRES - Dosed in mg/24 hr) patch 0.1 mg  0.1 mg Transdermal Weekly Donata ClaySmith, Justin R, DO      . cloNIDine (CATAPRES) tablet 0.1 mg  0.1 mg Oral Daily Donata ClaySmith, Justin R, DO   0.1 mg at 05/27/18 0802  . dicyclomine (BENTYL) tablet 20 mg  20 mg Oral Q6H PRN Luciano CutterSmith, Justin R, DO      . gabapentin (NEURONTIN) capsule 300 mg  300 mg Oral TID Donata ClaySmith, Justin R, DO   300 mg at 05/27/18 0803  . guaiFENesin (ROBITUSSIN) 100 MG/5ML solution 100 mg  5 mL Oral Q4H PRN Abi Shoults B, MD      . hydrOXYzine (ATARAX/VISTARIL) tablet 25 mg  25 mg Oral Q6H PRN Donata ClaySmith, Justin R, DO   25 mg at 05/26/18 2249  . loperamide (IMODIUM) capsule 2-4 mg  2-4 mg Oral PRN Donata ClaySmith, Justin R, DO   4 mg at 05/25/18 2054  . LORazepam (ATIVAN) tablet 1 mg  1 mg Oral Q6H PRN Luciano CutterSmith, Justin R, DO      . LORazepam (ATIVAN) tablet 1 mg  1 mg Oral TID Donata ClaySmith, Justin R, DO   1 mg at 05/27/18 [id number]   Followed by  . [START ON 05/28/2018] LORazepam (ATIVAN) tablet 1 mg  1 mg Oral BID Donata ClaySmith, Justin R, DO       Followed by  . [START ON 05/29/2018] LORazepam (ATIVAN) tablet 1 mg  1 mg Oral Daily Donata ClaySmith, Justin R, DO      . metFORMIN (GLUCOPHAGE) tablet 500 mg  500 mg Oral Q breakfast Donata ClaySmith, Justin R, DO   500 mg at 05/27/18 [id number]  . methocarbamol (ROBAXIN) tablet 500 mg  500 mg Oral Q8H PRN Donata ClaySmith, Justin R, DO   500 mg at 05/26/18 [id number]  . metoprolol succinate (TOPROL-XL) 24 hr tablet 25 mg  25 mg Oral Daily Donata ClaySmith, Justin R, DO   25  mg at 05/27/18 [id number]  . multivitamin with minerals tablet 1 tablet  1 tablet Oral Daily Luciano CutterSmith, Justin R, DO  1 tablet at 05/27/18 0803  . mycophenolate (CELLCEPT) capsule 500 mg  500 mg Oral Daily Donata ClaySmith, Justin R, DO   500 mg at 05/27/18 [id number]   And  . mycophenolate (CELLCEPT) capsule 1,000 mg  1,000 mg Oral QHS Donata ClaySmith, Justin R, DO   1,000 mg at 05/26/18 2155  . nicotine (NICODERM CQ - dosed in mg/24 hours) patch 14 mg  14 mg Transdermal Daily Donata ClaySmith, Justin R, DO   14 mg at 05/27/18 [id number]  . ondansetron (ZOFRAN-ODT) disintegrating tablet 4 mg  4 mg Oral Q6H PRN Donata ClaySmith, Justin R, DO      . thiamine (VITAMIN B-1) tablet 100 mg  100 mg Oral Daily Donata ClaySmith, Justin R, DO   100 mg at 05/27/18 0803    Lab Results: No results found for this or any previous visit (from the past 48 hour(s)).  Blood Alcohol level:  Lab Results  Component Value Date   ETH 263 (H) 05/25/2018   ETH 392 (HH) 05/23/2018    Metabolic Disorder Labs: Lab Results  Component Value Date   HGBA1C 5.9 (H) 10/27/2017   MPG 122.63 10/27/2017   MPG 119.76 08/14/2017   No results found for: PROLACTIN Lab Results  Component Value Date   TRIG 112 01/20/2018    Physical Findings: AIMS: Facial and Oral Movements Muscles of Facial Expression: None, normal Lips and Perioral Area: None, normal Jaw: None, normal Tongue: None, normal,Extremity Movements Upper (arms, wrists, hands, fingers): None, normal Lower (legs, knees, ankles, toes): None, normal, Trunk Movements Neck, shoulders, hips: None, normal, Overall Severity Severity of abnormal movements (highest score from questions above): None, normal Incapacitation due to abnormal movements: None, normal Patient's awareness of abnormal movements (rate only patient's report): No Awareness, Dental Status Current problems with teeth and/or dentures?: No Does patient usually wear dentures?: No  CIWA:  CIWA-Ar Total: 0 COWS:  COWS Total Score: 3  Musculoskeletal: Strength & Muscle  Tone: decreased Gait & Station: unsteady Patient leans: N/A  Psychiatric Specialty Exam: Physical Exam  Nursing note and vitals reviewed. Psychiatric: His affect is blunt. His speech is delayed. He is slowed and withdrawn. Cognition and memory are impaired. He expresses impulsivity. He expresses suicidal ideation.    Review of Systems  Respiratory: Positive for cough.   Neurological: Negative.   Psychiatric/Behavioral: Positive for depression, substance abuse and suicidal ideas.  All other systems reviewed and are negative.   Blood pressure (!) 136/93, pulse 96, temperature 98.5 F (36.9 C), temperature source Oral, resp. rate 18, height 5\' 11"  (1.803 m), weight 78.9 kg, SpO2 100 %.Body mass index is 24.27 kg/m.  General Appearance: Disheveled  Eye Contact:  Good  Speech:  Clear and Coherent and Slow  Volume:  Decreased  Mood:  Depressed, Hopeless and Worthless  Affect:  Blunt  Thought Process:  Goal Directed and Descriptions of Associations: Intact  Orientation:  Full (Time, Place, and Person)  Thought Content:  WDL  Suicidal Thoughts:  Yes.  with intent/plan  Homicidal Thoughts:  No  Memory:  Immediate;   Fair Recent;   Fair Remote;   Fair  Judgement:  Poor  Insight:  Lacking  Psychomotor Activity:  Psychomotor Retardation  Concentration:  Concentration: Fair and Attention Span: Fair  Recall:  [employer]  Fund of Knowledge:  Fair  Language:  Fair  Akathisia:  No  Handed:  Right  AIMS (if indicated):     Assets:  Communication Skills Desire for Improvement Financial Resources/Insurance Housing Resilience Social Support  ADL's:  Intact  Cognition:  WNL  Sleep:  Number of Hours: 6     Treatment Plan Summary: Daily contact with patient to assess and evaluate symptoms and progress in treatment and Medication management   Mr, Bocian is a 58 year old male with alcoholism and mood instability admitted for suicidal ideation in the context of continuous  drinking.  #Suicidal Behavior -patient able to contract for safety in the hospital  #Alcohol Withdrawal -continue Ativan taper with CIWA -Thiamine  #Alcohol use disorder -patient open to medications to help with cravings,but at this time would like to wait until he is done with detox.Considering his chronic pain and difficulties with adherence acamprosate and naltrexone may not be effective.Would consider topiramate,but would need to research its use in patients with a history of myasthenia gravis.  #Diabetes -Metformin 500 mg p.o. daily -Gabapentin 300 mg p.o. 3 times daily  #Tobacco use -NicoDerm patch 14 mg daily, remind patient to remove at night  #Hypertension -Metoprolol 24-hour tablet 25 mg p.o. daily -Clonidine 0.1 mg BID  #History of Myasthenia Gravis -CellCept 500 mg p.o. every morning and 1000 mg p.o. nightly  #Fall risk -PT consult to determine if he needs a walker  #Comfort Medications -Tylenol650 mg p.o. every 6 hours as needed for pain Imodium2 to 4 mg p.o. as needed diarrhea zofran4 mg tablet p.o. every 6 hours for nausea Hydroxyzine 25 mg p.o. every 6 hours for anxiety Bentyl20 mg p.o. every 6 hours for abdominal cramping Robaxin500 mg p.o. every 8 hours for muscle cramps  #Disposition -discharge to home with family -needs follow up with at least [facility]   Kristine Linea, MD 05/27/2018, 9:55 AM

## 2018-05-27 NOTE — Plan of Care (Signed)
Pt. Is complaint with medications. Pt. Denies si/hi/avh, can contract for safety. Pt. Reports he can remain safe while on the unit.    Problem: Health Behavior/Discharge Planning: Goal: Compliance with treatment plan for underlying cause of condition will improve Outcome: Progressing   Problem: Safety: Goal: Periods of time without injury will increase Outcome: Progressing   Problem: Safety: Goal: Ability to remain free from injury will improve Outcome: Progressing   Problem: Medication: Goal: Compliance with prescribed medication regimen will improve Outcome: Progressing   Problem: Self-Concept: Goal: Ability to disclose and discuss suicidal ideas will improve Outcome: Progressing

## 2018-05-27 NOTE — Progress Notes (Signed)
Recreation Therapy Notes  INPATIENT RECREATION THERAPY ASSESSMENT  Patient Details Name: Archer Jeanfreau MRN: [id number] DOB: 1960-06-06 Today's Date: 05/27/2018       Information Obtained From: Patient  Able to Participate in Assessment/Interview: Yes  Patient Presentation: Responsive  Reason for Admission (Per Patient): Active Symptoms, Suicidal Ideation, Substance Abuse, Suicide Attempt  Patient Stressors:    Coping Skills:   Substance Abuse  Leisure Interests (2+):  (N/A)  Frequency of Recreation/Participation:    Awareness of Community Resources:     [employer]:     Current Use:    If no, Barriers?:    Expressed Interest in [employer] Information:    [place] of Residence:  Film/video editor  Patient Main Form of Transportation:    Patient Strengths:  N/A  Patient Identified Areas of Improvement:  N/A  Patient Goal for Hospitalization:  Stop drinking  Current SI (including self-harm):  No  Current HI:  No  Current AVH: No  Staff Intervention Plan: Group Attendance, Collaborate with Interdisciplinary Treatment Team  Consent to Intern Participation: N/A  Delmar Arriaga 05/27/2018, 2:52 PM

## 2018-05-27 NOTE — Tx Team (Addendum)
Interdisciplinary Treatment and Diagnostic Plan Update  05/27/2018 Time of Session: 9:30AM Kurt SalmJohn David Horton MRN: [id number]  Principal Diagnosis: Major depressive disorder, recurrent severe without psychotic features (HCC)  Secondary Diagnoses: Principal Problem:   Major depressive disorder, recurrent severe without psychotic features (HCC) Active Problems:   Type II diabetes mellitus with manifestations (HCC)   Myasthenia gravis (HCC)   HTN (hypertension)   Alcohol withdrawal (HCC)   Alcohol use disorder, severe, dependence (HCC)   Tobacco use disorder   Current Medications:  Current Facility-Administered Medications  Medication Dose Route Frequency Provider Last Rate Last Dose  . acetaminophen (TYLENOL) tablet 650 mg  650 mg Oral Q6H PRN Luciano CutterSmith, Justin R, DO   650 mg at 05/27/18 0810  . benzonatate (TESSALON) capsule 100 mg  100 mg Oral TID Pucilowska, Jolanta B, MD   100 mg at 05/27/18 1122  . cloNIDine (CATAPRES - Dosed in mg/24 hr) patch 0.1 mg  0.1 mg Transdermal Weekly Donata ClaySmith, Justin R, DO      . cloNIDine (CATAPRES) tablet 0.1 mg  0.1 mg Oral Daily Donata ClaySmith, Justin R, DO   0.1 mg at 05/27/18 0802  . dicyclomine (BENTYL) tablet 20 mg  20 mg Oral Q6H PRN Luciano CutterSmith, Justin R, DO      . gabapentin (NEURONTIN) capsule 300 mg  300 mg Oral TID Donata ClaySmith, Justin R, DO   300 mg at 05/27/18 1110  . guaiFENesin (ROBITUSSIN) 100 MG/5ML solution 100 mg  5 mL Oral Q4H PRN Pucilowska, Jolanta B, MD      . hydrOXYzine (ATARAX/VISTARIL) tablet 25 mg  25 mg Oral Q6H PRN Donata ClaySmith, Justin R, DO   25 mg at 05/26/18 2249  . loperamide (IMODIUM) capsule 2-4 mg  2-4 mg Oral PRN Donata ClaySmith, Justin R, DO   4 mg at 05/25/18 2054  . LORazepam (ATIVAN) tablet 1 mg  1 mg Oral Q6H PRN Luciano CutterSmith, Justin R, DO      . LORazepam (ATIVAN) tablet 1 mg  1 mg Oral TID Donata ClaySmith, Justin R, DO   1 mg at 05/27/18 1111   Followed by  . [START ON 05/28/2018] LORazepam (ATIVAN) tablet 1 mg  1 mg Oral BID Donata ClaySmith, Justin R, DO       Followed by  .  [START ON 05/29/2018] LORazepam (ATIVAN) tablet 1 mg  1 mg Oral Daily Donata ClaySmith, Justin R, DO      . metFORMIN (GLUCOPHAGE) tablet 500 mg  500 mg Oral Q breakfast Donata ClaySmith, Justin R, DO   500 mg at 05/27/18 [id number]  . methocarbamol (ROBAXIN) tablet 500 mg  500 mg Oral Q8H PRN Donata ClaySmith, Justin R, DO   500 mg at 05/26/18 [id number]  . metoprolol succinate (TOPROL-XL) 24 hr tablet 25 mg  25 mg Oral Daily Donata ClaySmith, Justin R, DO   25 mg at 05/27/18 [id number]  . multivitamin with minerals tablet 1 tablet  1 tablet Oral Daily Donata ClaySmith, Justin R, DO   1 tablet at 05/27/18 [id number]  . mycophenolate (CELLCEPT) capsule 500 mg  500 mg Oral Daily Donata ClaySmith, Justin R, DO   500 mg at 05/27/18 [id number]   And  . mycophenolate (CELLCEPT) capsule 1,000 mg  1,000 mg Oral QHS Donata ClaySmith, Justin R, DO   1,000 mg at 05/26/18 2155  . nicotine (NICODERM CQ - dosed in mg/24 hours) patch 14 mg  14 mg Transdermal Daily Donata ClaySmith, Justin R, DO   14 mg at 05/27/18 [id number]  . ondansetron (ZOFRAN-ODT) disintegrating tablet 4 mg  4 mg Oral Q6H PRN Katrinka BlazingSmith,  Eino Farber, DO      . thiamine (VITAMIN B-1) tablet 100 mg  100 mg Oral Daily Donata Clay R, DO   100 mg at 05/27/18 0803   PTA Medications: Medications Prior to Admission  Medication Sig Dispense Refill Last Dose  . B Complex-C (B-COMPLEX WITH VITAMIN C) tablet Take 1 tablet by mouth daily. 30 tablet 0 Past Month at Unknown time  . busPIRone (BUSPAR) 15 MG tablet Take 1 tablet (15 mg total) by mouth 2 (two) times daily. 60 tablet 0 Past Month at Unknown time  . DULoxetine (CYMBALTA) 30 MG capsule Take 30 mg by mouth daily at 6 (six) AM.   Past Month at Unknown time  . gabapentin (NEURONTIN) 300 MG capsule Take 2 capsules (600 mg total) by mouth 3 (three) times daily. 180 capsule 0 Past Month at Unknown time  . metFORMIN (GLUCOPHAGE) 500 MG tablet Take 1 tablet (500 mg total) by mouth daily. 30 tablet 0 Past Month at Unknown time  . metoprolol succinate (TOPROL-XL) 50 MG 24 hr tablet Take 1 tablet (50 mg total) by mouth daily. Take  with or immediately following a meal. 30 tablet 0 Past Month at Unknown time  . multivitamin (ONE-A-DAY MEN'S) TABS tablet Take 1 tablet by mouth daily. 30 tablet 1 Past Month at Unknown time  . mycophenolate (CELLCEPT) 500 MG tablet Take 1 tablet (500 mg total) by mouth 2 (two) times daily. Take 1 tablet (500MG ) by mouth every morning and 2 tablets (1000MG ) by mouth every evening (Patient taking differently: Take 500-1,000 mg by mouth See admin instructions. 500 mg every morning and 1000 mg at bedtime) 90 tablet 0 Past Month at Unknown time  . oxyCODONE (OXY IR/ROXICODONE) 5 MG immediate release tablet Take 1 tablet (5 mg total) by mouth every 6 (six) hours as needed for severe pain. 12 tablet 0 Past Month at Unknown time  . senna-docusate (SENOKOT-S) 8.6-50 MG tablet Take 1 tablet by mouth at bedtime as needed for mild constipation. (Patient taking differently: Take 1 tablet by mouth daily. ) 30 tablet 0 Past Month at Unknown time  . testosterone cypionate (DEPOTESTOSTERONE CYPIONATE) 200 MG/ML injection Inject 200 mg into the muscle every 14 (fourteen) days.   Past Month at Unknown time  . thiamine 100 MG tablet Take 1 tablet (100 mg total) by mouth daily. 30 tablet 1 Past Month at Unknown time    Patient Stressors: Marital or family conflict Medication change or noncompliance Substance abuse  Patient Strengths: Metallurgist fund of knowledge  Treatment Modalities: Medication Management, Group therapy, Case management,  1 to 1 session with clinician, Psychoeducation, Recreational therapy.   Physician Treatment Plan for Primary Diagnosis: Major depressive disorder, recurrent severe without psychotic features (HCC) Long Term Goal(s): Improvement in symptoms so as ready for discharge   Short Term Goals: Ability to identify changes in lifestyle to reduce recurrence of condition will improve Ability to verbalize feelings will improve Ability to disclose and  discuss suicidal ideas Ability to demonstrate self-control will improve Ability to identify and develop effective coping behaviors will improve Ability to maintain clinical measurements within normal limits will improve Compliance with prescribed medications will improve Ability to identify triggers associated with substance abuse/mental health issues will improve  Medication Management: Evaluate patient's response, side effects, and tolerance of medication regimen.  Therapeutic Interventions: 1 to 1 sessions, Unit Group sessions and Medication administration.  Evaluation of Outcomes: Progressing  Physician Treatment Plan for Secondary Diagnosis: Principal Problem:  Major depressive disorder, recurrent severe without psychotic features (HCC) Active Problems:   Type II diabetes mellitus with manifestations (HCC)   Myasthenia gravis (HCC)   HTN (hypertension)   Alcohol withdrawal (HCC)   Alcohol use disorder, severe, dependence (HCC)   Tobacco use disorder  Long Term Goal(s): Improvement in symptoms so as ready for discharge   Short Term Goals: Ability to identify changes in lifestyle to reduce recurrence of condition will improve Ability to verbalize feelings will improve Ability to disclose and discuss suicidal ideas Ability to demonstrate self-control will improve Ability to identify and develop effective coping behaviors will improve Ability to maintain clinical measurements within normal limits will improve Compliance with prescribed medications will improve Ability to identify triggers associated with substance abuse/mental health issues will improve     Medication Management: Evaluate patient's response, side effects, and tolerance of medication regimen.  Therapeutic Interventions: 1 to 1 sessions, Unit Group sessions and Medication administration.  Evaluation of Outcomes: Progressing   RN Treatment Plan for Primary Diagnosis: Major depressive disorder, recurrent severe  without psychotic features (HCC) Long Term Goal(s): Knowledge of disease and therapeutic regimen to maintain health will improve  Short Term Goals: Ability to participate in decision making will improve, Ability to verbalize feelings will improve, Ability to identify and develop effective coping behaviors will improve and Compliance with prescribed medications will improve  Medication Management: RN will administer medications as ordered by provider, will assess and evaluate patient's response and provide education to patient for prescribed medication. RN will report any adverse and/or side effects to prescribing provider.  Therapeutic Interventions: 1 on 1 counseling sessions, Psychoeducation, Medication administration, Evaluate responses to treatment, Monitor vital signs and CBGs as ordered, Perform/monitor CIWA, COWS, AIMS and Fall Risk screenings as ordered, Perform wound care treatments as ordered.  Evaluation of Outcomes: Progressing   LCSW Treatment Plan for Primary Diagnosis: Major depressive disorder, recurrent severe without psychotic features (HCC) Long Term Goal(s): Safe transition to appropriate next level of care at discharge, Engage patient in therapeutic group addressing interpersonal concerns.  Short Term Goals: Engage patient in aftercare planning with referrals and resources, Increase social support, Increase ability to appropriately verbalize feelings, Increase emotional regulation and Identify triggers associated with mental health/substance abuse issues  Therapeutic Interventions: Assess for all discharge needs, 1 to 1 time with Social worker, Explore available resources and support systems, Assess for adequacy in community support network, Educate family and significant other(s) on suicide prevention, Complete Psychosocial Assessment, Interpersonal group therapy.  Evaluation of Outcomes: Progressing   Progress in Treatment: Attending groups: No. Participating in groups:  No. Taking medication as prescribed: Yes. Toleration medication: Yes. Family/Significant other contact made: No, will contact:  CSW will contact family support once permission has been given. Patient understands diagnosis: Yes. Discussing patient identified problems/goals with staff: Yes. Medical problems stabilized or resolved: Yes. Denies suicidal/homicidal ideation: Yes. Issues/concerns per patient self-inventory: No. Other: none  New problem(s) identified: No, Describe:  none  New Short Term/Long Term Goal(s): detox, elimination of symptoms of psychosis, medication management for mood stabilization; development of comprehensive mental wellness/sobriety plan.  Patient Goals:  "Stop drinking"  Discharge Plan or Barriers: Patient reports that he wants information on housing.  Patient also reports that he is open to residential treatment for SA use.  Reason for Continuation of Hospitalization: Anxiety Depression Medication stabilization Withdrawal symptoms  Estimated Length of Stay: 1-3 days  Recreational Therapy: Patient Stressors: N/A  Patient Goal: Patient will identify 3 positive replacements for unhealthy/harmful  habits within 5 recreation therapy group sessions  Attendees: Patient: Kurt SimmerJohn Horton 05/27/2018 1:00 PM  Physician: Dr. Jennet MaduroPucilowska 05/27/2018 1:00 PM  Nursing: Cecille AmsterdamGigi Manirattui, RN 05/27/2018 1:00 PM  RN Care Manager: 05/27/2018 1:00 PM  Social Worker: Penni HomansMichaela Stanfield, LCSW 05/27/2018 1:00 PM  Recreational Therapist: Hilbert BibleShay Teneisha Gignac, CTRS, LRT 05/27/2018 1:00 PM  Other:  05/27/2018 1:00 PM  Other:  05/27/2018 1:00 PM  Other: 05/27/2018 1:00 PM    Scribe for Treatment Team: Harden MoMichaela J Stanfield, LCSW 05/27/2018 1:00 PM

## 2018-05-27 NOTE — [facility] Group Notes (Signed)
Overcoming Obstacles  05/27/2018 1PM  Type of Therapy and Topic:  Group Therapy:  Overcoming Obstacles  Participation Level:  Did Not Attend    Description of Group:    In this group patients will be encouraged to explore what they see as obstacles to their own wellness and recovery. They will be guided to discuss their thoughts, feelings, and behaviors related to these obstacles. The group will process together ways to cope with barriers, with attention given to specific choices patients can make. Each patient will be challenged to identify changes they are motivated to make in order to overcome their obstacles. This group will be process-oriented, with patients participating in exploration of their own experiences as well as giving and receiving support and challenge from other group members.   Therapeutic Goals: 1. Patient will identify personal and current obstacles as they relate to admission. 2. Patient will identify barriers that currently interfere with their wellness or overcoming obstacles.  3. Patient will identify feelings, thought process and behaviors related to these barriers. 4. Patient will identify two changes they are willing to make to overcome these obstacles:      Summary of Patient Progress     Therapeutic Modalities:   Cognitive Behavioral Therapy Solution Focused Therapy Motivational Interviewing Relapse Prevention Therapy    Lowella Dandy, MSW, LCSW 05/27/2018 2:14 PM

## 2018-05-27 NOTE — Evaluation (Signed)
Physical Therapy Evaluation Patient Details Name: Kurt Horton MRN: [id number] DOB: 09-19-1960 Today's Date: 05/27/2018   History of Present Illness  Pt is a 58 y/o M admitted to the inpatient psychiatric ward for recent suicidal behavior and alcohol withdrawal.  Per chart review, pt has had 12 admissions at this facility for alcohol detox in the past year. Pt's PMH includes chronic pain, myasthenia gravis, a-fib.     Clinical Impression  Pt admitted with above diagnosis. Pt currently with functional limitations due to the deficits listed below (see PT Problem List). Mr. Diven was independently ambulating with SPC PTA and ind with bathing, dressing. Has 24/7 assist/supervision available from parents whom he lives with.  Pt currently demonstrates impaired balance compared to his baseline and thus recommending HHPT at d/c to address these deficits.  Pt demonstrates ability to ambulate in hallway with use of RW to remain steady and was instructed to exclusively use RW moving forward.  Pt will benefit from skilled PT to increase their independence and safety with mobility to allow discharge to the venue listed below.      Follow Up Recommendations Home health PT;Supervision for mobility/OOB    Equipment Recommendations  None recommended by PT    Recommendations for Other Services       Precautions / Restrictions Precautions Precautions: Fall Restrictions Weight Bearing Restrictions: No      Mobility  Bed Mobility Overal bed mobility: Independent Bed Mobility: Supine to Sit;Sit to Supine     Supine to sit: Independent Sit to supine: Independent   General bed mobility comments: No physical assist or cues needed.  Pt performs independently.   Transfers Overall transfer level: Needs assistance Equipment used: Rolling walker (2 wheeled) Transfers: Sit to/from Stand Sit to Stand: Min guard         General transfer comment: Pt demonstrates mild unsteadiness but no LOB.  Min  guard for safety.   Ambulation/Gait Ambulation/Gait assistance: Min guard Gait Distance (Feet): 90 Feet Assistive device: Rolling walker (2 wheeled) Gait Pattern/deviations: Decreased step length - right;Decreased step length - left;Trunk flexed Gait velocity: decreased   General Gait Details: Cues for forward gaze and upright posture.  Pt steady with use of RW but demonstrates guarded posture and dec gait speed.    Stairs            Wheelchair Mobility    Modified Rankin (Stroke Patients Only)       Balance Overall balance assessment: Needs assistance;History of Falls Sitting-balance support: No upper extremity supported;Feet supported Sitting balance-Leahy Scale: Good     Standing balance support: Bilateral upper extremity supported;During functional activity Standing balance-Leahy Scale: Poor Standing balance comment: Pt relies on UE support for static and dynamic activities Single Leg Stance - Right Leg: 4     Tandem Stance - Left Leg: 8 Rhomberg - Eyes Opened: 30(increased sway) Rhomberg - Eyes Closed: 15(increased sway)                 Pertinent Vitals/Pain Pain Assessment: Faces Faces Pain Scale: Hurts little more Pain Location: chronic low back pain Pain Descriptors / Indicators: Discomfort Pain Intervention(s): Limited activity within patient's tolerance;Monitored during session    Home Living Family/patient expects to be discharged to:: Private residence Living Arrangements: Parent Available Help at Discharge: Family;Available 24 hours/day Type of Home: House Home Access: Level entry     Home Layout: One level Home Equipment: Walker - 2 wheels;Cane - single point Additional Comments: Information taken from previous admission as  plan is currently to return to parent's home.  Pt confirmed his parents are available for 24/7 assist/supervision.     Prior Function Level of Independence: Independent with assistive device(s)          Comments: Ambulates with SPC at all times.  Frequent falls when intoxicated, does report some falling when not intoxicated.  Pt ind with bathing, dressing.  Does not drive, says he uses [employer] when he leaves the house.  Pt and family order in and do not do much cooking.      Hand Dominance   Dominant Hand: Right    Extremity/Trunk Assessment   Upper Extremity Assessment Upper Extremity Assessment: (BUE strength grossly 4/5)    Lower Extremity Assessment Lower Extremity Assessment: Generalized weakness    Cervical / Trunk Assessment Cervical / Trunk Assessment: Other exceptions Cervical / Trunk Exceptions: h/o chronic back pain  Communication   Communication: No difficulties  Cognition Arousal/Alertness: Awake/alert Behavior During Therapy: Flat affect Overall Cognitive Status: Within Functional Limits for tasks assessed                                        General Comments General comments (skin integrity, edema, etc.): Instructed pt to exclusively use RW moving forward.      Exercises     Assessment/Plan    PT Assessment Patient needs continued PT services  PT Problem List Decreased strength;Decreased activity tolerance;Decreased balance;Decreased knowledge of use of DME;Decreased safety awareness       PT Treatment Interventions DME instruction;Gait training;Stair training;Functional mobility training;Therapeutic activities;Therapeutic exercise;Balance training;Neuromuscular re-education;Patient/family education    PT Goals (Current goals can be found in the Care Plan section)  Acute Rehab PT Goals Patient Stated Goal: to improve strength and mobility PT Goal Formulation: With patient Time For Goal Achievement: 06/10/18 Potential to Achieve Goals: Good    Frequency Min 2X/week   Barriers to discharge        Co-evaluation               AM-PAC PT "6 Clicks" Mobility  Outcome Measure Help needed turning from your back to your side  while in a flat bed without using bedrails?: None Help needed moving from lying on your back to sitting on the side of a flat bed without using bedrails?: None Help needed moving to and from a bed to a chair (including a wheelchair)?: A Little Help needed standing up from a chair using your arms (e.g., wheelchair or bedside chair)?: A Little Help needed to walk in hospital room?: A Little Help needed climbing 3-5 steps with a railing? : A Little 6 Click Score: 20    End of Session Equipment Utilized During Treatment: Gait belt Activity Tolerance: Patient limited by fatigue(BUE fatigue from use of RW) Patient left: in bed Nurse Communication: Mobility status PT Visit Diagnosis: Unsteadiness on feet (R26.81);Other abnormalities of gait and mobility (R26.89);Muscle weakness (generalized) (M62.81);History of falling (Z91.81)    Time: 3474-2595 PT Time Calculation (min) (ACUTE ONLY): 20 min   Charges:   PT Evaluation $PT Eval Low Complexity: 1 Low PT Treatments $Gait Training: 8-22 mins        Encarnacion Chu PT, DPT 05/27/2018, 3:25 PM

## 2018-05-28 ENCOUNTER — Ambulatory Visit: Payer: Self-pay | Admitting: Student in an Organized Health Care Education/Training Program

## 2018-05-28 LAB — GLUCOSE, CAPILLARY: Glucose-Capillary: 120 mg/dL — ABNORMAL HIGH (ref 70–99)

## 2018-05-28 MED ORDER — TRAZODONE HCL 100 MG PO TABS
100.0000 mg | ORAL_TABLET | Freq: Once | ORAL | Status: AC
  Administered 2018-05-28: 100 mg via ORAL
  Filled 2018-05-28: qty 1

## 2018-05-28 NOTE — Progress Notes (Deleted)
Nursing 1:1 note D:Pt observed in dayroom . RR even and unlabored. No distress noted. A: 1:1 observation continues for safety  R: pt remains safe  

## 2018-05-28 NOTE — Plan of Care (Signed)
Patient compliant with medication administration per MD orders. Patient states, "I am having a hard time sleeping." Patient fell asleep around 2400 and woke up at 0245 asking for sleep medications. Patient given education.    Problem: Medication: Goal: Compliance with prescribed medication regimen will improve Outcome: Progressing   Problem: Activity: Goal: Sleeping patterns will improve Outcome: Not Progressing

## 2018-05-28 NOTE — [facility] Group Notes (Signed)
LCSW Group Therapy Note  05/28/2018 1:00 PM  Type of Therapy/Topic:  Group Therapy:  Feelings about Diagnosis  Participation Level:  Active   Description of Group:   This group will allow patients to explore their thoughts and feelings about diagnoses they have received. Patients will be guided to explore their level of understanding and acceptance of these diagnoses. Facilitator will encourage patients to process their thoughts and feelings about the reactions of others to their diagnosis and will guide patients in identifying ways to discuss their diagnosis with significant others in their lives. This group will be process-oriented, with patients participating in exploration of their own experiences, giving and receiving support, and processing challenge from other group members.   Therapeutic Goals: 1. Patient will demonstrate understanding of diagnosis as evidenced by identifying two or more symptoms of the disorder 2. Patient will be able to express two feelings regarding the diagnosis 3. Patient will demonstrate their ability to communicate their needs through discussion and/or role play  Summary of Patient Progress: Patient was present for group and an active participant. Patient shared with the group that he has struggled with substance use issues.  Patient reports that he has felt anger and frustration with his diagnosis as well as hopelessness.  Patient identified that his family has felt confusion and subsequently anger or frustration.  Patient received support from other group members and was supportive to others.    Therapeutic Modalities:   Cognitive Behavioral Therapy Brief Therapy Feelings Identification   Penni Homans, MSW, LCSW 05/28/2018 2:22 PM

## 2018-05-28 NOTE — [facility] Group Notes (Signed)
[facility] Group Notes:  (Nursing/MHT/Case Management/Adjunct)  Date:  05/28/2018  Time:  10:03 PM  Type of Therapy:  Group Therapy  Participation Level:  Active  Participation Quality:  Appropriate  Affect:  Appropriate  Cognitive:  Appropriate  Insight:  Appropriate  Engagement in Group:  Engaged  Modes of Intervention:  Discussion  Summary of Progress/Problems: Kurt Horton stated he goes by Kurt Horton. Kurt Horton stated his goal was to get rest and reduce his pain. Kurt Horton stated that was partially accomplished. Kurt Horton stated he wanted to interact in group and get out of his room more. MHT informed patients of rules and expectations while on the unit. MHT informed patients they needed to provide their code to any one they wanted to be able to contact them. MHT informed patients of visitation hours and phone hours. MHT informed patients of to fill out snack sheet. MHT informed patients food was not allowed in the rooms. MHT informed patients there was no touching, hugging, or doing other patient's hair on the unit. MHT informed patients of routine checks and to cover appropriately throughout the night. MHT reminded patients not to give out personal information and to only use first names. [name] 05/28/2018, 10:03 PM

## 2018-05-28 NOTE — Plan of Care (Signed)
Patient is alert and oriented X 4. Denies SI today. Denies HI, AVH. Patient continues to complain of depression, anxiety and hopelessness. Patient states today, " I have chronic pain due to a spinal disk injury and the pain just radiates down my back and legs so that's one reason why I feel the way I do, depressed."  Patient also complains about not getting enough sleep during the night. No self injurious behaviors noted. Problem: Education: Goal: Knowledge of [facility] Education information/materials will improve Outcome: Progressing Goal: Emotional status will improve Outcome: Progressing Goal: Mental status will improve Outcome: Progressing Goal: Verbalization of understanding the information provided will improve Outcome: Progressing   Problem: Activity: Goal: Interest or engagement in activities will improve Outcome: Progressing Goal: Sleeping patterns will improve Outcome: Progressing   Problem: Coping: Goal: Ability to verbalize frustrations and anger appropriately will improve Outcome: Progressing Goal: Ability to demonstrate self-control will improve Outcome: Progressing

## 2018-05-28 NOTE — Progress Notes (Signed)
D - Patient was in the day room upon arrival to the unit. Patient was pleasant during assessment and medication administration. Patient denies SI/HI/AVH. Patient rated depression and anxiety 5/10. Patient denies pain at this time. Patient was seen acting appropriately with staff and peers on the unit by this Clinical research associate. Patient stated, "I need something to help me sleep." at 0245. Patient given education.   A - Patient was compliant with medication administration per MD orders. Patient given education. Patient given support and encouragement to be active in his treatment plan. Patient informed to let staff know if there are any issues or problems on the unit.   R - Patient verbally contracts for safety with this Clinical research associate. Patient being monitored Q 15 minutes for safety per unit protocol. Patient remains safe at this time.

## 2018-05-28 NOTE — Progress Notes (Signed)
During 15 minute checks, patient found trying to reopen a cut on left wrist, crying stating, " I don't want to be here any more I have money but no family." patient was cleansed, emotional support given. Patient now in bed with eyes closed, no signs of distress.

## 2018-05-28 NOTE — Progress Notes (Signed)
Recreation Therapy Notes   Date: 05/28/2018  Time: 9:30 am   Location: Craft room   Behavioral response: N/A   Intervention Topic: Communication   Discussion/Intervention: Patient did not attend group.   Clinical Observations/Feedback:  Patient did not attend group.   Sakari Raisanen LRT/CTRS        Ladarren Steiner 05/28/2018 10:37 AM

## 2018-05-28 NOTE — Progress Notes (Signed)
[facility] MD Progress Note  05/28/2018 8:56 AM Kurt Horton  MRN:  [id number]  Subjective:   Kurt Horton reports some improvement. His mood is better and he is no longer suicidal. He still feels down and hipeless about his chances to maintain sobriety following discharge. He is not interested in residential program participation but seems to agree to [facility] with [facility]. He spoke with Unk Pinto already. Sleep was interrupted last night and he feels tired.  Principal Problem: Major depressive disorder, recurrent severe without psychotic features (HCC) Diagnosis: Principal Problem:   Major depressive disorder, recurrent severe without psychotic features (HCC) Active Problems:   Type II diabetes mellitus with manifestations (HCC)   Myasthenia gravis (HCC)   HTN (hypertension)   Alcohol withdrawal (HCC)   Alcohol use disorder, severe, dependence (HCC)   Tobacco use disorder  Total Time spent with patient: 20 minutes  Past Psychiatric History: alcoholism, mood instability  Past Medical History:  Past Medical History:  Diagnosis Date  . A-fib (HCC)   . Alcohol abuse   . Alcohol withdrawal (HCC) 11/12/2016  . Diabetes mellitus without complication (HCC)   . DJD (degenerative joint disease)   . Hypertension   . Myasthenia gravis (HCC)   . Renal disorder     Past Surgical History:  Procedure Laterality Date  . AMPUTATION TOE Left 12/29/2015   Procedure: AMPUTATION TOE;  Surgeon: Gwyneth Revels, DPM;  Location: [facility] ORS;  Service: Podiatry;  Laterality: Left;  . ESOPHAGOGASTRODUODENOSCOPY N/A 02/05/2018   Procedure: ESOPHAGOGASTRODUODENOSCOPY (EGD);  Surgeon: Pasty Spillers, MD;  Location: [facility] ENDOSCOPY;  Service: Endoscopy;  Laterality: N/A;  . PERIPHERAL VASCULAR CATHETERIZATION N/A 12/27/2015   Procedure: Lower Extremity Angiography;  Surgeon: Annice Needy, MD;  Location: [facility] INVASIVE CV LAB;  Service: Cardiovascular;  Laterality: N/A;  . right hip surgery    . right shoulder  surgery     Family History:  Family History  Problem Relation Age of Onset  . Rheum arthritis Father    Family Psychiatric  History: none Social History:  Social History   Substance and Sexual Activity  Alcohol Use Yes  . Alcohol/week: 20.0 - 30.0 standard drinks  . Types: 20 - 30 Shots of liquor per week   Comment: Pt had 20 airplane bottles today     Social History   Substance and Sexual Activity  Drug Use Yes  . Types: Oxycodone    Social History   Socioeconomic History  . Marital status: Divorced    Spouse name: Not on file  . Number of children: Not on file  . Years of education: Not on file  . Highest education level: Not on file  Occupational History  . Not on file  Social Needs  . Financial resource strain: Not on file  . Food insecurity:    Worry: Not on file    Inability: Not on file  . Transportation needs:    Medical: Not on file    Non-medical: Not on file  Tobacco Use  . Smoking status: Current Every Day Smoker    Packs/day: 0.50    Types: Cigarettes  . Smokeless tobacco: Never Used  Substance and Sexual Activity  . Alcohol use: Yes    Alcohol/week: 20.0 - 30.0 standard drinks    Types: 20 - 30 Shots of liquor per week    Comment: Pt had 20 airplane bottles today  . Drug use: Yes    Types: Oxycodone  . Sexual activity: Not on file  Lifestyle  .  Physical activity:    Days per week: Not on file    Minutes per session: Not on file  . Stress: Not on file  Relationships  . Social connections:    Talks on phone: Not on file    Gets together: Not on file    Attends religious service: Not on file    Active member of club or organization: Not on file    Attends meetings of clubs or organizations: Not on file    Relationship status: Not on file  Other Topics Concern  . Not on file  Social History Narrative   Live with Parents, ambulates with a cane. Falls lately.   Additional Social History:                         Sleep:  Poor  Appetite:  Poor  Current Medications: Current Facility-Administered Medications  Medication Dose Route Frequency Provider Last Rate Last Dose  . acetaminophen (TYLENOL) tablet 650 mg  650 mg Oral Q6H PRN Luciano CutterSmith, Justin R, DO   650 mg at 05/28/18 0421  . benzonatate (TESSALON) capsule 100 mg  100 mg Oral TID Eli Adami B, MD   100 mg at 05/28/18 0818  . cloNIDine (CATAPRES - Dosed in mg/24 hr) patch 0.1 mg  0.1 mg Transdermal Weekly Donata ClaySmith, Justin R, DO      . cloNIDine (CATAPRES) tablet 0.1 mg  0.1 mg Oral Daily Donata ClaySmith, Justin R, DO   0.1 mg at 05/28/18 0818  . dicyclomine (BENTYL) tablet 20 mg  20 mg Oral Q6H PRN Luciano CutterSmith, Justin R, DO      . gabapentin (NEURONTIN) capsule 300 mg  300 mg Oral TID Donata ClaySmith, Justin R, DO   300 mg at 05/28/18 0817  . guaiFENesin (ROBITUSSIN) 100 MG/5ML solution 100 mg  5 mL Oral Q4H PRN Abigael Mogle B, MD      . hydrOXYzine (ATARAX/VISTARIL) tablet 25 mg  25 mg Oral Q6H PRN Donata ClaySmith, Justin R, DO   25 mg at 05/28/18 0421  . loperamide (IMODIUM) capsule 2-4 mg  2-4 mg Oral PRN Donata ClaySmith, Justin R, DO   4 mg at 05/25/18 2054  . LORazepam (ATIVAN) tablet 1 mg  1 mg Oral Q6H PRN Donata ClaySmith, Justin R, DO   1 mg at 05/28/18 0421  . LORazepam (ATIVAN) tablet 1 mg  1 mg Oral BID Donata ClaySmith, Justin R, DO   1 mg at 05/28/18 [id number]   Followed by  . [START ON 05/29/2018] LORazepam (ATIVAN) tablet 1 mg  1 mg Oral Daily Donata ClaySmith, Justin R, DO      . metFORMIN (GLUCOPHAGE) tablet 500 mg  500 mg Oral Q breakfast Donata ClaySmith, Justin R, DO   500 mg at 05/28/18 0818  . methocarbamol (ROBAXIN) tablet 500 mg  500 mg Oral Q8H PRN Donata ClaySmith, Justin R, DO   500 mg at 05/28/18 0345  . metoprolol succinate (TOPROL-XL) 24 hr tablet 25 mg  25 mg Oral Daily Donata ClaySmith, Justin R, DO   25 mg at 05/28/18 [id number]  . multivitamin with minerals tablet 1 tablet  1 tablet Oral Daily Luciano CutterSmith, Justin R, DO   1 tablet at 05/28/18 [id number]  . mycophenolate (CELLCEPT) capsule 500 mg  500 mg Oral Daily Donata ClaySmith, Justin R, DO   500 mg at  05/28/18 0825   And  . mycophenolate (CELLCEPT) capsule 1,000 mg  1,000 mg Oral QHS Donata ClaySmith, Justin R, DO   1,000 mg at 05/27/18 2209  . nicotine (  NICODERM CQ - dosed in mg/24 hours) patch 14 mg  14 mg Transdermal Daily Donata Clay R, DO   14 mg at 05/28/18 1610  . ondansetron (ZOFRAN-ODT) disintegrating tablet 4 mg  4 mg Oral Q6H PRN Donata Clay R, DO      . thiamine (VITAMIN B-1) tablet 100 mg  100 mg Oral Daily Donata Clay R, DO   100 mg at 05/28/18 9604    Lab Results: No results found for this or any previous visit (from the past 48 hour(s)).  Blood Alcohol level:  Lab Results  Component Value Date   ETH 263 (H) 05/25/2018   ETH 392 (HH) 05/23/2018    Metabolic Disorder Labs: Lab Results  Component Value Date   HGBA1C 5.9 (H) 10/27/2017   MPG 122.63 10/27/2017   MPG 119.76 08/14/2017   No results found for: PROLACTIN Lab Results  Component Value Date   TRIG 112 01/20/2018    Physical Findings: AIMS: Facial and Oral Movements Muscles of Facial Expression: None, normal Lips and Perioral Area: None, normal Jaw: None, normal Tongue: None, normal,Extremity Movements Upper (arms, wrists, hands, fingers): None, normal Lower (legs, knees, ankles, toes): None, normal, Trunk Movements Neck, shoulders, hips: None, normal, Overall Severity Severity of abnormal movements (highest score from questions above): None, normal Incapacitation due to abnormal movements: None, normal Patient's awareness of abnormal movements (rate only patient's report): No Awareness, Dental Status Current problems with teeth and/or dentures?: No Does patient usually wear dentures?: No  CIWA:  CIWA-Ar Total: 10 COWS:  COWS Total Score: 0  Musculoskeletal: Strength & Muscle Tone: within normal limits Gait & Station: normal Patient leans: N/A  Psychiatric Specialty Exam: Physical Exam  Nursing note and vitals reviewed. Psychiatric: Thought content normal. His affect is blunt. His speech is  delayed. He is slowed and withdrawn. Cognition and memory are impaired. He expresses impulsivity.    Review of Systems  Neurological: Negative.   Psychiatric/Behavioral: Positive for depression and substance abuse.  All other systems reviewed and are negative.   Blood pressure (!) 137/99, pulse (!) 102, temperature 98.7 F (37.1 C), temperature source Oral, resp. rate 18, height 5\' 11"  (1.803 m), weight 78.9 kg, SpO2 99 %.Body mass index is 24.27 kg/m.  General Appearance: Disheveled  Eye Contact:  Fair  Speech:  Clear and Coherent  Volume:  Decreased  Mood:  Depressed, Hopeless and Worthless  Affect:  Blunt  Thought Process:  Goal Directed and Descriptions of Associations: Intact  Orientation:  Full (Time, Place, and Person)  Thought Content:  WDL  Suicidal Thoughts:  No  Homicidal Thoughts:  No  Memory:  Immediate;   Poor Recent;   Poor Remote;   Poor  Judgement:  Poor  Insight:  Lacking  Psychomotor Activity:  Decreased  Concentration:  Concentration: Poor and Attention Span: Poor  Recall:  Poor  Fund of Knowledge:  Fair  Language:  Fair  Akathisia:  No  Handed:  Right  AIMS (if indicated):     Assets:  Communication Skills Desire for Improvement Financial Resources/Insurance Housing Resilience Social Support  ADL's:  Intact  Cognition:  WNL  Sleep:  Number of Hours: 4.5     Treatment Plan Summary: Daily contact with patient to assess and evaluate symptoms and progress in treatment and Medication management   Mr, Sutch is a 58 year old male with alcoholism and mood instability admitted for suicidal ideation in the context of continuous drinking.  #Suicidal Behavior -patient able to contract for safety in the  hospital  #Alcohol Withdrawal -continue Ativan taper with CIWA -Thiamine  #Alcohol use disorder -patient open to medications to help with cravings, considering his chronic pain and difficulties with adherence acamprosate and naltrexone may not be  effective.Would consider topiramate,but would need to research its use in patients with a history of myasthenia gravis. Topamax should be used with caution in patient with myastenia.  #Diabetes -Metformin 500 mg p.o. daily -Gabapentin 300 mg p.o. 3 times daily  #Tobacco use -NicoDerm patch 14 mg daily, remind patient to remove at night  #Hypertension -Metoprolol 24-hour tablet 25 mg p.o. daily -Clonidine 0.1 mg BID  #History of Myasthenia Gravis -CellCept 500 mg p.o. every morning and 1000 mg p.o. nightly  #Fall risk -PT consult is appreciated -no equipment is recommended  #Comfort Medications -Tylenol650 mg p.o. every 6 hours as needed for pain Imodium2 to 4 mg p.o. as needed diarrhea zofran4 mg tablet p.o. every 6 hours for nausea Hydroxyzine 25 mg p.o. every 6 hours for anxiety Bentyl20 mg p.o. every 6 hours for abdominal cramping Robaxin500 mg p.o. every 8 hours for muscle cramps  #Disposition -discharge to home with family -needs follow up with at least [facility]  Kristine LineaJolanta Chimamanda Siegfried, MD 05/28/2018, 8:56 AM

## 2018-05-28 NOTE — Progress Notes (Signed)
PT Cancellation Note  Patient Details Name: Alper Noel MRN: [id number] DOB: [birth year]   Cancelled Treatment:    Reason Eval/Treat Not Completed: Other (comment).  Attempted to see pt for PT session; however, pt reporting, "I have the shakes" and not feeling well and declines working with PT.  Pt requests ativan, notified RN who is already aware.    Encarnacion Chu PT, DPT 05/28/2018, 4:06 PM

## 2018-05-29 LAB — GLUCOSE, CAPILLARY: Glucose-Capillary: 150 mg/dL — ABNORMAL HIGH (ref 70–99)

## 2018-05-29 MED ORDER — CHLORDIAZEPOXIDE HCL 10 MG PO CAPS
10.0000 mg | ORAL_CAPSULE | Freq: Four times a day (QID) | ORAL | Status: DC
  Administered 2018-05-29 – 2018-05-30 (×4): 10 mg via ORAL
  Filled 2018-05-29 (×4): qty 1

## 2018-05-29 MED ORDER — METOPROLOL SUCCINATE ER 25 MG PO TB24
50.0000 mg | ORAL_TABLET | Freq: Every day | ORAL | Status: DC
  Administered 2018-05-30: 50 mg via ORAL
  Filled 2018-05-29: qty 2

## 2018-05-29 MED ORDER — GABAPENTIN 300 MG PO CAPS
600.0000 mg | ORAL_CAPSULE | Freq: Three times a day (TID) | ORAL | Status: DC
  Administered 2018-05-29 – 2018-05-30 (×3): 600 mg via ORAL
  Filled 2018-05-29 (×3): qty 2

## 2018-05-29 MED ORDER — MIRTAZAPINE 15 MG PO TABS
7.5000 mg | ORAL_TABLET | Freq: Every day | ORAL | Status: DC
  Administered 2018-05-29: 7.5 mg via ORAL
  Filled 2018-05-29: qty 1

## 2018-05-29 NOTE — Plan of Care (Signed)
Patient is alert and oriented X 4, passive SI, denies HI and AVH.  Patient complains of depression and constant pain. Patient receives gabapentin to help with neuropathic pains. Patient has crying spells, and states he does not sleep well at night. Patient constantly seeking more PRNs to help with pain or anxiety. Patient out more in the dayroom today. No tremors noted. Safety checks Q 15 minutes. Problem: Education: Goal: Knowledge of [facility] Education information/materials will improve Outcome: Progressing Goal: Emotional status will improve Outcome: Progressing Goal: Mental status will improve Outcome: Progressing Goal: Verbalization of understanding the information provided will improve Outcome: Progressing   Problem: Activity: Goal: Interest or engagement in activities will improve Outcome: Progressing Goal: Sleeping patterns will improve Outcome: Progressing   Problem: Coping: Goal: Ability to verbalize frustrations and anger appropriately will improve Outcome: Progressing Goal: Ability to demonstrate self-control will improve Outcome: Progressing

## 2018-05-29 NOTE — Progress Notes (Signed)
D: Pt passive SI-contracts for safety.  denies HI/AVH. Pt is pleasant and cooperative. Pt visible on the unit much of this evening.   A:  Pt was offered support and encouragement. Pt was given scheduled medications. Pt was encourage to attend groups. Q 15 minute checks were done for safety.   R:  safety maintained on unit.   Problem: Education: Goal: Emotional status will improve Outcome: Progressing   Problem: Education: Goal: Mental status will improve Outcome: Progressing   Problem: Education: Goal: Verbalization of understanding the information provided will improve Outcome: Progressing

## 2018-05-29 NOTE — BHH Counselor (Signed)
CSW faxed referral for [facility].  CSW will follow up on bed availability.   Penni Homans, MSW, LCSW 05/29/2018 4:40 PM

## 2018-05-29 NOTE — B[facility] Group Notes (Signed)
Emotional Regulation 05/29/2018 1PM  Type of Therapy/Topic:  Group Therapy:  Emotion Regulation  Participation Level:  Did Not Attend   Description of Group:   The purpose of this group is to assist patients in learning to regulate negative emotions and experience positive emotions. Patients will be guided to discuss ways in which they have been vulnerable to their negative emotions. These vulnerabilities will be juxtaposed with experiences of positive emotions or situations, and patients will be challenged to use positive emotions to combat negative ones. Special emphasis will be placed on coping with negative emotions in conflict situations, and patients will process healthy conflict resolution skills.  Therapeutic Goals: 1. Patient will identify two positive emotions or experiences to reflect on in order to balance out negative emotions 2. Patient will label two or more emotions that they find the most difficult to experience 3. Patient will demonstrate positive conflict resolution skills through discussion and/or role plays  Summary of Patient Progress:       Therapeutic Modalities:   Cognitive Behavioral Therapy Feelings Identification Dialectical Behavioral Therapy   Kurt Horton T Mechelle Pates, LCSW 05/29/2018 1:54 PM  

## 2018-05-29 NOTE — BHH Group Notes (Signed)
B[facility] Group Notes:  (Nursing/MHT/Case Management/Adjunct)  Date:  05/29/2018  Time:  9:32 PM  Type of Therapy:  Group Therapy  Participation Level:  Active  Participation Quality:  Appropriate  Affect:  Appropriate  Cognitive:  Appropriate  Insight:  Appropriate  Engagement in Group:  Engaged  Modes of Intervention:  Education  Summary of Progress/Problems: MHT informed patients of rules and expectations of the unit. MHT reviewed visitation and call hours. MHT informed patients they needed to provide their code to any one they wanted to contact them. MHT reminded patients that staff would not confirm or deny if they were on the unit if the person did not have the code. MHT encouraged patients not to take food to their rooms. MHT encouraged patients to clean up after themselves. MHT informed patients of routine checks and not to be alarmed if they see or hear techs opening their doors. MHT processed with patients about shift change at 7a and 7p. MHT encouraged patients to get what they need before report started or after it was completed. MHT informed patients not to give out personal information and to only use first names while on the unit. MHT discussed resources that were available in the community. [name] 05/29/2018, 9:32 PM

## 2018-05-29 NOTE — Progress Notes (Signed)
[facility] MD Progress Note  05/29/2018 1:54 PM Derion Kreiter  MRN:  [id number]  Subjective:    Mr. Fortson met with treatment team today to discus disposition as he was too confused to make any decision. He completed Ativan taper but his vital signs are still elevated. He complains of shakes and sweats. Today he informs [place] that he is ready for rehab. He has been to [facility] in the past. [facility] no longer allows smoking so the patient declined. His information was send to [facility] but they do not have beds available immediately. He can return to his fathre's house but thinks he will resume drinking immediately.  He complains of pain al over that is not being helped with Neurontin. I see that in 2019 he went to pain clinic. He has no health insurance which is strange given his multiple medical problems.   Principal Problem: Major depressive disorder, recurrent severe without psychotic features ([place]) Diagnosis: Principal Problem:   Major depressive disorder, recurrent severe without psychotic features ([place]) Active Problems:   Type II diabetes mellitus with manifestations ([place])   Myasthenia gravis ([place])   HTN (hypertension)   Alcohol withdrawal ([place])   Alcohol use disorder, severe, dependence ([place])   Tobacco use disorder  Total Time spent with patient: 20 minutes  Past Psychiatric History: depression, alcoholism  Past Medical History:  Past Medical History:  Diagnosis Date  . A-fib ([place])   . Alcohol abuse   . Alcohol withdrawal ([place]) 11/12/2016  . Diabetes mellitus without complication ([place])   . DJD (degenerative joint disease)   . Hypertension   . Myasthenia gravis ([place])   . Renal disorder     Past Surgical History:  Procedure Laterality Date  . AMPUTATION TOE Left 12/29/2015   Procedure: AMPUTATION TOE;  Surgeon: Samara Deist, DPM;  Location: [facility] ORS;  Service: Podiatry;  Laterality: Left;  . ESOPHAGOGASTRODUODENOSCOPY N/A 02/05/2018   Procedure: ESOPHAGOGASTRODUODENOSCOPY (EGD);   Surgeon: Virgel Manifold, MD;  Location: [facility] ENDOSCOPY;  Service: Endoscopy;  Laterality: N/A;  . PERIPHERAL VASCULAR CATHETERIZATION N/A 12/27/2015   Procedure: Lower Extremity Angiography;  Surgeon: Algernon Huxley, MD;  Location: [place] CV LAB;  Service: Cardiovascular;  Laterality: N/A;  . right hip surgery    . right shoulder surgery     Family History:  Family History  Problem Relation Age of Onset  . Rheum arthritis Father    Family Psychiatric  History: none Social History:  Social History   Substance and Sexual Activity  Alcohol Use Yes  . Alcohol/week: 20.0 - 30.0 standard drinks  . Types: 20 - 30 Shots of liquor per week   Comment: Pt had 20 airplane bottles today     Social History   Substance and Sexual Activity  Drug Use Yes  . Types: Oxycodone    Social History   Socioeconomic History  . Marital status: Divorced    Spouse name: Not on file  . Number of children: Not on file  . Years of education: Not on file  . Highest education level: Not on file  Occupational History  . Not on file  Social Needs  . Financial resource strain: Not on file  . Food insecurity:    Worry: Not on file    Inability: Not on file  . Transportation needs:    Medical: Not on file    Non-medical: Not on file  Tobacco Use  . Smoking status: Current Every Day Smoker    Packs/day: 0.50  Types: Cigarettes  . Smokeless tobacco: Never Used  Substance and Sexual Activity  . Alcohol use: Yes    Alcohol/week: 20.0 - 30.0 standard drinks    Types: 20 - 30 Shots of liquor per week    Comment: Pt had 20 airplane bottles today  . Drug use: Yes    Types: Oxycodone  . Sexual activity: Not on file  Lifestyle  . Physical activity:    Days per week: Not on file    Minutes per session: Not on file  . Stress: Not on file  Relationships  . Social connections:    Talks on phone: Not on file    Gets together: Not on file    Attends religious service: Not on file    Active  member of club or organization: Not on file    Attends meetings of clubs or organizations: Not on file    Relationship status: Not on file  Other Topics Concern  . Not on file  Social History Narrative   Live with Parents, ambulates with a cane. Falls lately.   Additional Social History:                         Sleep: Poor  Appetite:  Fair  Current Medications: Current Facility-Administered Medications  Medication Dose Route Frequency Provider Last Rate Last Dose  . acetaminophen (TYLENOL) tablet 650 mg  650 mg Oral Q6H PRN Chong Sicilian, DO   650 mg at 05/29/18 0263  . benzonatate (TESSALON) capsule 100 mg  100 mg Oral TID Kion Huntsberry B, MD   100 mg at 05/29/18 1217  . cloNIDine (CATAPRES - Dosed in mg/24 hr) patch 0.1 mg  0.1 mg Transdermal Weekly Solon Palm R, DO      . cloNIDine (CATAPRES) tablet 0.1 mg  0.1 mg Oral Daily Solon Palm R, DO   0.1 mg at 05/29/18 7858  . dicyclomine (BENTYL) tablet 20 mg  20 mg Oral Q6H PRN Chong Sicilian, DO      . gabapentin (NEURONTIN) capsule 300 mg  300 mg Oral TID Solon Palm R, DO   300 mg at 05/29/18 1215  . guaiFENesin (ROBITUSSIN) 100 MG/5ML solution 100 mg  5 mL Oral Q4H PRN Zachrey Deutscher B, MD      . metFORMIN (GLUCOPHAGE) tablet 500 mg  500 mg Oral Q breakfast Solon Palm R, DO   500 mg at 05/29/18 8502  . methocarbamol (ROBAXIN) tablet 500 mg  500 mg Oral Q8H PRN Solon Palm R, DO   500 mg at 05/29/18 1215  . metoprolol succinate (TOPROL-XL) 24 hr tablet 25 mg  25 mg Oral Daily Solon Palm R, DO   25 mg at 05/29/18 7741  . multivitamin with minerals tablet 1 tablet  1 tablet Oral Daily Solon Palm R, DO   1 tablet at 05/29/18 2878  . mycophenolate (CELLCEPT) capsule 500 mg  500 mg Oral Daily Solon Palm R, DO   500 mg at 05/29/18 6767   And  . mycophenolate (CELLCEPT) capsule 1,000 mg  1,000 mg Oral QHS Solon Palm R, DO   1,000 mg at 05/28/18 2222  . nicotine (NICODERM CQ - dosed in mg/24  hours) patch 14 mg  14 mg Transdermal Daily Solon Palm R, DO   14 mg at 05/29/18 2094  . thiamine (VITAMIN B-1) tablet 100 mg  100 mg Oral Daily Solon Palm R, DO   100 mg at 05/29/18 [phone]  Lab Results:  Results for orders placed or performed during the hospital encounter of 05/25/18 (from the past 48 hour(s))  Glucose, capillary     Status: Abnormal   Collection Time: 05/28/18  7:10 AM  Result Value Ref Range   Glucose-Capillary 120 (H) 70 - 99 mg/dL  Glucose, capillary     Status: Abnormal   Collection Time: 05/29/18  6:59 AM  Result Value Ref Range   Glucose-Capillary 150 (H) 70 - 99 mg/dL   Comment 1 Notify RN     Blood Alcohol level:  Lab Results  Component Value Date   ETH 263 (H) 05/25/2018   ETH 392 (HH) 11/94/1740    Metabolic Disorder Labs: Lab Results  Component Value Date   HGBA1C 5.9 (H) 10/27/2017   MPG 122.63 10/27/2017   MPG 119.76 08/14/2017   No results found for: PROLACTIN Lab Results  Component Value Date   TRIG 112 01/20/2018    Physical Findings: AIMS: Facial and Oral Movements Muscles of Facial Expression: None, normal Lips and Perioral Area: None, normal Jaw: None, normal Tongue: None, normal,Extremity Movements Upper (arms, wrists, hands, fingers): None, normal Lower (legs, knees, ankles, toes): None, normal, Trunk Movements Neck, shoulders, hips: None, normal, Overall Severity Severity of abnormal movements (highest score from questions above): None, normal Incapacitation due to abnormal movements: None, normal Patient's awareness of abnormal movements (rate only patient's report): No Awareness, Dental Status Current problems with teeth and/or dentures?: No Does patient usually wear dentures?: No  CIWA:  CIWA-Ar Total: 10 COWS:  COWS Total Score: 6  Musculoskeletal: Strength & Muscle Tone: within normal limits Gait & Station: unsteady Patient leans: N/A  Psychiatric Specialty Exam: Physical Exam  Nursing note and vitals  reviewed. Psychiatric: His speech is normal. Thought content normal. His affect is blunt. He is withdrawn. Cognition and memory are impaired. He expresses impulsivity. He exhibits a depressed mood.    Review of Systems  Musculoskeletal: Positive for joint pain and myalgias.  Neurological: Negative.   Psychiatric/Behavioral: Positive for depression and substance abuse.  All other systems reviewed and are negative.   Blood pressure (!) 150/103, pulse (!) 110, temperature 98.1 F (36.7 C), temperature source Oral, resp. rate 18, height 5' 11" (1.803 m), weight 78.9 kg, SpO2 100 %.Body mass index is 24.27 kg/m.  General Appearance: Disheveled  Eye Contact:  Fair  Speech:  Clear and Coherent  Volume:  Decreased  Mood:  Depressed  Affect:  Blunt  Thought Process:  Goal Directed and Descriptions of Associations: Tangential  Orientation:  Full (Time, Place, and Person)  Thought Content:  WDL  Suicidal Thoughts:  No  Homicidal Thoughts:  No  Memory:  Immediate;   Poor Recent;   Poor Remote;   Poor  Judgement:  Poor  Insight:  Lacking  Psychomotor Activity:  Decreased  Concentration:  Concentration: Poor and Attention Span: Poor  Recall:  Poor  Fund of Knowledge:  Poor  Language:  Poor  Akathisia:  No  Handed:  Right  AIMS (if indicated):     Assets:  Communication Skills Desire for Improvement Housing Resilience Social Support  ADL's:  Intact  Cognition:  WNL  Sleep:  Number of Hours: 5.3     Treatment Plan Summary: Daily contact with patient to assess and evaluate symptoms and progress in treatment and Medication management   Mr, Kurt Horton is a 59 year old male with alcoholism and mood instability admitted for suicidal ideation in the context of continuous drinking.  #Suicidal Behavior -patient able  to contract for safety in the hospital  #Mood -start Remeron 7.5 mg nightly  #Alcohol withdrawal -completed Ativan taper with CIWA -will offer low dose Librium to  address withdrawal symptoms -Thiamine  #Alcohol use disorder -patient interested in residential treatment -referred to [facility] -Topamax, considered for cravings, not indicated in myastenia  #Chronic pain -increase Neurontin to 600 mg TID -Robaxin 500 mg TID PRN  #Diabetes -Metformin 500 mg p.o. daily  #Tobacco use -NicoDerm patch 14 mg daily, remind patient to remove at night  #Hypertension -increase Metoprolol 24-hour tablet 50 mg daily -Clonidine 0.1 mg patch  #History of Myasthenia Gravis -CellCept 500 mg p.o. every morning and 1000 mg p.o. nightly  #Fall risk -PT consult is appreciated -no equipment is recommended  #Disposition -discharge to home with family -needs follow up with at least [facility]  Orson Slick, MD 05/29/2018, 1:54 PM

## 2018-05-29 NOTE — Progress Notes (Signed)
Recreation Therapy Notes    Date: 05/29/2018  Time: 9:30 am  Location: Craft Room  Behavioral response: Appropriate  Intervention Topic: Problem Solving  Discussion/Intervention:  Group content on today was focused on problem solving. The group described what problem solving is. Patients expressed how problems affect them and how they deal with problems. Individuals identified healthy ways to deal with problems. Patients explained what normally happens to them when they do not deal with problems. The group expressed reoccurring problems for them. The group participated in the intervention "Put the story together" with their peers and worked together to put a story that was broken up in the correct order. Clinical Observations/Feedback:  Patient came to group and defined problem solving finding a way to remove obstacles in life. He expressed that he liked his problem at first which is why he never sought out help. Participant shared that he did not feel his drinking was a problem until he saw that his drinking was affecting other people he cared about. Patient stated he now wants help and that is how he is trying to solve his problem. Individual was social with peers and staff while participating in the intervention. [employer] LRT/CTRS         Maico Mulvehill 05/29/2018 12:22 PM

## 2018-05-30 LAB — GLUCOSE, CAPILLARY: Glucose-Capillary: 86 mg/dL (ref 70–99)

## 2018-05-30 MED ORDER — METFORMIN HCL 500 MG PO TABS: 500.0000 mg | ORAL_TABLET | Freq: Every day | ORAL | 1 refills | Status: DC

## 2018-05-30 MED ORDER — GABAPENTIN 300 MG PO CAPS: 600.0000 mg | ORAL_CAPSULE | Freq: Three times a day (TID) | ORAL | 0 refills | Status: DC

## 2018-05-30 MED ORDER — MIRTAZAPINE 15 MG PO TABS: 15.0000 mg | ORAL_TABLET | Freq: Every day | ORAL | 1 refills | Status: DC

## 2018-05-30 MED ORDER — MIRTAZAPINE 15 MG PO TABS: 15.0000 mg | ORAL_TABLET | Freq: Every day | ORAL | Status: DC

## 2018-05-30 MED ORDER — METOPROLOL SUCCINATE ER 50 MG PO TB24: 50.0000 mg | ORAL_TABLET | Freq: Every day | ORAL | 1 refills | Status: DC

## 2018-05-30 MED ORDER — TRAZODONE HCL 100 MG PO TABS: 100.0000 mg | ORAL_TABLET | Freq: Every day | ORAL | 1 refills | Status: DC

## 2018-05-30 MED ORDER — MYCOPHENOLATE MOFETIL 500 MG PO TABS: 500.0000 mg | ORAL_TABLET | Freq: Two times a day (BID) | ORAL | 1 refills | Status: DC

## 2018-05-30 MED ORDER — TRAZODONE HCL 100 MG PO TABS: 100.0000 mg | ORAL_TABLET | Freq: Every day | ORAL | Status: DC

## 2018-05-30 MED ORDER — THIAMINE HCL 100 MG PO TABS: 100.0000 mg | ORAL_TABLET | Freq: Every day | ORAL | 1 refills | Status: DC

## 2018-05-30 MED ORDER — CLONIDINE 0.1 MG/24HR TD PTWK: 0.1000 mg | MEDICATED_PATCH | TRANSDERMAL | 12 refills | Status: DC

## 2018-05-30 NOTE — BHH Group Notes (Signed)
LCSW Group Therapy Note  05/30/2018 1:00 PM  Type of Therapy/Topic:  Group Therapy:  Balance in Life  Participation Level:  Active  Description of Group:    This group will address the concept of balance and how it feels and looks when one is unbalanced. Patients will be encouraged to process areas in their lives that are out of balance and identify reasons for remaining unbalanced. Facilitators will guide patients in utilizing problem-solving interventions to address and correct the stressor making their life unbalanced. Understanding and applying boundaries will be explored and addressed for obtaining and maintaining a balanced life. Patients will be encouraged to explore ways to assertively make their unbalanced needs known to significant others in their lives, using other group members and facilitator for support and feedback.  Therapeutic Goals: 1. Patient will identify two or more emotions or situations they have that consume much of in their lives. 2. Patient will identify signs/triggers that life has become out of balance:  3. Patient will identify two ways to set boundaries in order to achieve balance in their lives:  4. Patient will demonstrate ability to communicate their needs through discussion and/or role plays  Summary of Patient Progress: Patient was present and attentive in group.  Patient shared his perspective on how being unbalanced has negatively impacted him in his life.  Patient was able to identify interventions and coping skills that he has used, including faith and acceptance in helping to adjust to feelings of being unbalanced.   Therapeutic Modalities:   Cognitive Behavioral Therapy Solution-Focused Therapy Assertiveness Training  Penni HomansMichaela Ryhanna Dunsmore MSW, LCSW 05/30/2018 2:26 PM

## 2018-05-30 NOTE — BHH Suicide Risk Assessment (Signed)
[facility] Discharge Suicide Risk Assessment   Principal Problem: Major depressive disorder, recurrent severe without psychotic features (HCC) Discharge Diagnoses: Principal Problem:   Major depressive disorder, recurrent severe without psychotic features (HCC) Active Problems:   Type II diabetes mellitus with manifestations (HCC)   Myasthenia gravis (HCC)   HTN (hypertension)   Alcohol withdrawal (HCC)   Alcohol use disorder, severe, dependence (HCC)   Tobacco use disorder   Total Time spent with patient: 20 minutes  Musculoskeletal: Strength & Muscle Tone: within normal limits Gait & Station: normal Patient leans: N/A  Psychiatric Specialty Exam: Review of Systems  Neurological: Positive for weakness.  Psychiatric/Behavioral: Positive for substance abuse.  All other systems reviewed and are negative.   Blood pressure (!) 159/95, pulse 95, temperature 97.7 F (36.5 C), temperature source Oral, resp. rate 18, height 5\' 11"  (1.803 m), weight 78.9 kg, SpO2 100 %.Body mass index is 24.27 kg/m.  General Appearance: Casual  Eye Contact::  Good  Speech:  Clear and Coherent409  Volume:  Normal  Mood:  Euthymic  Affect:  Appropriate  Thought Process:  Goal Directed and Descriptions of Associations: Intact  Orientation:  Full (Time, Place, and Person)  Thought Content:  WDL  Suicidal Thoughts:  No  Homicidal Thoughts:  No  Memory:  Immediate;   Fair Recent;   Fair Remote;   Fair  Judgement:  Poor  Insight:  Lacking  Psychomotor Activity:  Normal  Concentration:  Fair  Recall:  [employer] of Knowledge:Fair  Language: Fair  Akathisia:  No  Handed:  Right  AIMS (if indicated):     Assets:  Communication Skills Desire for Improvement Housing Resilience Social Support  Sleep:  Number of Hours: 3.5  Cognition: WNL  ADL's:  Intact   Mental Status Per Nursing Assessment::   On Admission:  Suicidal ideation indicated by patient  Demographic Factors:  Male and  Caucasian  Loss Factors: Decline in physical health  Historical Factors: Impulsivity  Risk Reduction Factors:   Sense of responsibility to family, Living with another person, especially a relative and Positive social support  Continued Clinical Symptoms:  Depression:   Comorbid alcohol abuse/dependence Impulsivity Alcohol/Substance Abuse/Dependencies  Cognitive Features That Contribute To Risk:  None    Suicide Risk:  Minimal: No identifiable suicidal ideation.  Patients presenting with no risk factors but with morbid ruminations; may be classified as minimal risk based on the severity of the depressive symptoms  Follow-up Information    [facility] Follow up.   Contact information: [address] [place] [address] [phone]           Plan Of Care/Follow-up recommendations:  Activity:  as tolerated Diet:  low sodium heart healthy Other:  keep follow up appointments  Kristine Linea, MD 05/30/2018, 9:29 AM

## 2018-05-30 NOTE — Progress Notes (Addendum)
  [facility] Adult Case Management Discharge Plan :  Will you be returning to the same living situation after discharge:  Yes,  pt will be returning to the home of his parents At discharge, do you have transportation home?: Yes,  pt will be provided a taxi. Do you have the ability to pay for your medications: Yes,  pt will be offered medication application.   Release of information consent forms completed and in the chart;  Patient's signature needed at discharge.  Patient to Follow up at: Follow-up Information    [facility] Follow up.   Specialty:  Addiction Medicine Why:  Please call and check on the status of your referral.  There are no beds presently and may have openings next week.  Ask what can/can't bring and any necessary paperwork needed.  Contact information: [address] [place] [address] [phone]    [facility] Follow up on 06/05/2018.   Why:  Lorella Nimrod will provide transportation to [facility] appointment on 06/05/2018 at 7:15AM.  He will be in contact with you.  Referral for peer support services with [facility]. Contact information: [address] [place] [address] [phone]          Next level of care provider has access to [facility] Link:no  Safety Planning and Suicide Prevention discussed: Yes,  SPE completed with father.  Have you used any form of tobacco in the last 30 days? (Cigarettes, Smokeless Tobacco, Cigars, and/or Pipes): Yes  Has patient been referred to the Quitline?: Patient refused referral  Patient has been referred for addiction treatment: Yes  Harden Mo, LCSW 05/30/2018, 9:36 AM   CSW added [facility] information.

## 2018-05-30 NOTE — Discharge Summary (Signed)
Physician Discharge Summary Note  Patient:  Kurt Horton is an 58 y.o., male MRN:  [id number] DOB:  19-Jan-1961 Patient phone:  [phone] (home)  Patient address:   [address] [place] [address],  Total Time spent with patient: 20 minutes plus 15 min on care coordination and documentation  Date of Admission:  05/25/2018 Date of Discharge: 05/30/2018  Reason for Admission:  Suicidal ideation.  History of Present Illness: This is a 58 year old Caucasian male patient.  Who is currently involuntarily admitted to the inpatient psychiatric ward for recent suicidal behavior, and alcohol withdrawal.  The patient has had 12 admissions to the medical service at this facility for alcohol detox in the past year.  His last admission was from January 9 through January 14, at which point he was discharged to his parents house.  At that time he relapsed on alcohol and presented back to the emergency department on January 16 with a BAL of 392.  At that time notes reflect that he was alert and conversing freely, he was given Librium and discharged back to his parents house after he declined detox.  He reports returning to his parents home, and again relapsing on alcohol.  He stated that he drinks between 25 and 30 "airplane" bottles of rum.  He stated that he became "unable to deal with the pain" and cut his wrists with a kitchen knife prior to telling his parents to call 911.  Psychiatric review of systems: The patient reports chronic difficulties with sleep, energy, appetite, inappropriate feelings of guilt, anhedonia.  However he is unable to separate these symptoms in time from alcohol use.  The patient denies symptoms consistent with bipolar disorder.  He denies symptoms consistent with schizophrenia.  He reports having "anxiety" related to psychosocial stressors.  He denies symptoms associated with OCD he denies any childhood or adult traumatic experiences.  He reports he has been hospitalized  multiple times for delirium tremens, and has had times of hallucinating and was unable to recall if he is ever had a seizure.  He is unable to recall a past period of time of sobriety lasting greater than 1 month.  Stated that his drinking increased from 1 "fifth" (749m) of hard liquor per day to 25-30 "airplane drinks" (>12560m over the past 3 years.  He believes that his divorce separation from his family and retiring from his previous job have contributed greatly to his increased use.  He also reported that he has been poorly compliant with his usual medications.  He reports taking 10 mg of Percocet for 2 or more times a day, along with drinking.  During patient's exam he was noted to have an initial coarse tremor present in his bilateral upper extremities, which extinguished during the course of the interview and with distraction.  Past Psychiatric History: He denies any past suicide attempts, he denies any outpatient psychiatric history.  He [place] He reports that he has been prescribed antidepressants by his primary care provider, and has been on Celexa and Cymbalta.  He denies knowledge of other psychiatric medications.  Family Psychiatric  History: denies  Social History.   Patient reports that he was born in [place] He states that his childhood was "fine".  He reports that he is in the middle of his siblings he has an older brother and a younger brother along with a younger sister.  Stated that his education was "okay".  He went to  college for [employer] and obtained his MBA.  He was married at the age of 55 and has 2 children.  He is divorced 3 years ago.  His previous career was as a Biomedical engineer.  He has been living with his parents since the summer 2017.   Principal Problem: Major depressive disorder, recurrent severe without psychotic features [facility]) Discharge Diagnoses: Principal Problem:   Major depressive disorder,  recurrent severe without psychotic features ([place]) Active Problems:   Type II diabetes mellitus with manifestations ([place])   Myasthenia gravis ([place])   HTN (hypertension)   Alcohol withdrawal ([place])   Alcohol use disorder, severe, dependence ([place])   Tobacco use disorder   Past Medical History:  Past Medical History:  Diagnosis Date  . A-fib ([place])   . Alcohol abuse   . Alcohol withdrawal ([place]) 11/12/2016  . Diabetes mellitus without complication ([place])   . DJD (degenerative joint disease)   . Hypertension   . Myasthenia gravis ([place])   . Renal disorder     Past Surgical History:  Procedure Laterality Date  . AMPUTATION TOE Left 12/29/2015   Procedure: AMPUTATION TOE;  Surgeon: Samara Deist, DPM;  Location: [facility] ORS;  Service: Podiatry;  Laterality: Left;  . ESOPHAGOGASTRODUODENOSCOPY N/A 02/05/2018   Procedure: ESOPHAGOGASTRODUODENOSCOPY (EGD);  Surgeon: Virgel Manifold, MD;  Location: [facility] ENDOSCOPY;  Service: Endoscopy;  Laterality: N/A;  . PERIPHERAL VASCULAR CATHETERIZATION N/A 12/27/2015   Procedure: Lower Extremity Angiography;  Surgeon: Algernon Huxley, MD;  Location: [facility] CV LAB;  Service: Cardiovascular;  Laterality: N/A;  . right hip surgery    . right shoulder surgery     Family History:  Family History  Problem Relation Age of Onset  . Rheum arthritis Father    Social History:  Social History   Substance and Sexual Activity  Alcohol Use Yes  . Alcohol/week: 20.0 - 30.0 standard drinks  . Types: 20 - 30 Shots of liquor per week   Comment: Pt had 20 airplane bottles today     Social History   Substance and Sexual Activity  Drug Use Yes  . Types: Oxycodone    Social History   Socioeconomic History  . Marital status: Divorced    Spouse name: Not on file  . Number of children: Not on file  . Years of education: Not on file  . Highest education level: Not on file  Occupational History  . Not on file  Social Needs  . Financial resource strain: Not  on file  . Food insecurity:    Worry: Not on file    Inability: Not on file  . Transportation needs:    Medical: Not on file    Non-medical: Not on file  Tobacco Use  . Smoking status: Current Every Day Smoker    Packs/day: 0.50    Types: Cigarettes  . Smokeless tobacco: Never Used  Substance and Sexual Activity  . Alcohol use: Yes    Alcohol/week: 20.0 - 30.0 standard drinks    Types: 20 - 30 Shots of liquor per week    Comment: Pt had 20 airplane bottles today  . Drug use: Yes    Types: Oxycodone  . Sexual activity: Not on file  Lifestyle  . Physical activity:    Days per week: Not on file    Minutes per session: Not on file  . Stress: Not on file  Relationships  . Social connections:    Talks on phone: Not on file  Gets together: Not on file    Attends religious service: Not on file    Active member of club or organization: Not on file    Attends meetings of clubs or organizations: Not on file    Relationship status: Not on file  Other Topics Concern  . Not on file  Social History Narrative   Live with Parents, ambulates with a cane. Falls lately.    Hospital Course:    Mr, Noguez is a 58 year old male with alcoholism and mood instability admitted for suicidal ideation in the context of continuous drinking.  Mr. Kader is a 58 year old male with a history of depression and severe alcoholism admitted for suicidal ideation in the context of continuous drinking. The patient completed alcohol detox and accepted medications for depression which he tolerated well. At the time of discharge, the patient is no longer suicidal or homicidal. He is able to contract for safety. He is forward thinking and optimistic about the future. Unfortunately, no beds at rehab at [facility] are available. He will follow up with [facility] program at [facility]. He met with Oswaldo Done of [place] already.  #Mood, improved -continue Remeron 15 mg nightly -Trazodone 100 mg nightly PRN insomnia  #Alcohol  withdrawal -completed Ativan taper with CIWA and low dose Librium taper, VS are stable -continue Thiamine  #Alcohol use disorder -patient interested in residential treatment -referred to [facility] but no beds available -open to [facility]OP -Topamax, considered for cravings, not indicated in myastenia  #Chronic pain -continue Neurontin to 600 mg TID  #Diabetes -Metformin 500 mg p.o. daily  #Smoking cessation -NicoDerm patch was available  #Hypertension -continue Metoprolol 24-hour tablet 50 mg daily -Clonidine 0.1 mg patch weekly  #History of Myasthenia Gravis -CellCept 500 mg every morning and 1000 mgnightly  #Fall risk -PT consultis appreciated -no equipment is recommended  #Social -patient receives disability but no longer Medicaid as he failed to reapply -he believes he qualifies for Medicare at this point but did not take any steps to initiate it  #Disposition -discharge to home with family -follow up with [facility] at [facility]  Physical Findings: AIMS: Facial and Oral Movements Muscles of Facial Expression: None, normal Lips and Perioral Area: None, normal Jaw: None, normal Tongue: None, normal,Extremity Movements Upper (arms, wrists, hands, fingers): None, normal Lower (legs, knees, ankles, toes): None, normal, Trunk Movements Neck, shoulders, hips: None, normal, Overall Severity Severity of abnormal movements (highest score from questions above): None, normal Incapacitation due to abnormal movements: None, normal Patient's awareness of abnormal movements (rate only patient's report): No Awareness, Dental Status Current problems with teeth and/or dentures?: No Does patient usually wear dentures?: No  CIWA:  CIWA-Ar Total: 10 COWS:  COWS Total Score: 0  Musculoskeletal: Strength & Muscle Tone: within normal limits Gait & Station: normal Patient leans: N/A  Psychiatric Specialty Exam: Physical Exam  Nursing note and vitals reviewed. Psychiatric: He has a normal  mood and affect. His behavior is normal. Thought content normal. Cognition and memory are normal. He expresses impulsivity.    Review of Systems  Musculoskeletal: Positive for myalgias.  Neurological: Positive for weakness.  Psychiatric/Behavioral: Positive for substance abuse.  All other systems reviewed and are negative.   Blood pressure (!) 159/95, pulse 95, temperature 97.7 F (36.5 C), temperature source Oral, resp. rate 18, height 5' 11" (1.803 m), weight 78.9 kg, SpO2 100 %.Body mass index is 24.27 kg/m.  General Appearance: Casual  Eye Contact:  Good  Speech:  Clear and Coherent  Volume:  Normal  Mood:  Euthymic  Affect:  Appropriate  Thought Process:  Goal Directed and Descriptions of Associations: Intact  Orientation:  Full (Time, Place, and Person)  Thought Content:  WDL  Suicidal Thoughts:  No  Homicidal Thoughts:  No  Memory:  Immediate;   Fair Recent;   Fair Remote;   Fair  Judgement:  Poor  Insight:  Lacking  Psychomotor Activity:  Normal  Concentration:  Concentration: Fair and Attention Span: Fair  Recall:  [employer] of Knowledge:  Fair  Language:  Fair  Akathisia:  No  Handed:  Right  AIMS (if indicated):     Assets:  Communication Skills Desire for Improvement Housing Resilience Social Support  ADL's:  Intact  Cognition:  WNL  Sleep:  Number of Hours: 3.5     Have you used any form of tobacco in the last 30 days? (Cigarettes, Smokeless Tobacco, Cigars, and/or Pipes): Yes  Has this patient used any form of tobacco in the last 30 days? (Cigarettes, Smokeless Tobacco, Cigars, and/or Pipes) Yes, Yes, A prescription for an FDA-approved tobacco cessation medication was offered at discharge and the patient refused  Blood Alcohol level:  Lab Results  Component Value Date   ETH 263 (H) 05/25/2018   ETH 392 ([place]) 02/72/5366    Metabolic Disorder Labs:  Lab Results  Component Value Date   HGBA1C 5.9 (H) 10/27/2017   MPG 122.63 10/27/2017   MPG  119.76 08/14/2017   No results found for: PROLACTIN Lab Results  Component Value Date   TRIG 112 01/20/2018    See Psychiatric Specialty Exam and Suicide Risk Assessment completed by Attending Physician prior to discharge.  Discharge destination:  Home  Is patient on multiple antipsychotic therapies at discharge:  No   Has Patient had three or more failed trials of antipsychotic monotherapy by history:  No  Recommended Plan for Multiple Antipsychotic Therapies: NA  Discharge Instructions    Diet - low sodium heart healthy   Complete by:  As directed    Increase activity slowly   Complete by:  As directed      Allergies as of 05/30/2018      Reactions   Nsaids Other (See Comments)   Duodenal ulcers, GI bleeding   Other Other (See Comments)   Patient states he can't take any "mycin" medications because myasthenia gravis.      Medication List    STOP taking these medications   busPIRone 15 MG tablet Commonly known as:  BUSPAR   DULoxetine 30 MG capsule Commonly known as:  CYMBALTA   oxyCODONE 5 MG immediate release tablet Commonly known as:  Oxy IR/ROXICODONE     TAKE these medications     Indication  B-complex with vitamin C tablet Take 1 tablet by mouth daily.  Indication:  general health   cloNIDine 0.1 mg/24hr patch Commonly known as:  CATAPRES - Dosed in mg/24 hr Place 1 patch (0.1 mg total) onto the skin once a week.  Indication:  High Blood Pressure Disorder   gabapentin 300 MG capsule Commonly known as:  NEURONTIN Take 2 capsules (600 mg total) by mouth 3 (three) times daily.  Indication:  Diabetes with Nerve Disease   metFORMIN 500 MG tablet Commonly known as:  GLUCOPHAGE Take 1 tablet (500 mg total) by mouth daily.  Indication:  Type 2 Diabetes   metoprolol succinate 50 MG 24 hr tablet Commonly known as:  TOPROL-XL Take 1 tablet (50 mg total) by mouth daily. Take with or immediately  following a meal.  Indication:  High Blood Pressure  Disorder   mirtazapine 15 MG tablet Commonly known as:  REMERON Take 1 tablet (15 mg total) by mouth at bedtime.  Indication:  Major Depressive Disorder   multivitamin Tabs tablet Take 1 tablet by mouth daily.  Indication:  general health   mycophenolate 500 MG tablet Commonly known as:  CELLCEPT Take 1 tablet (500 mg total) by mouth 2 (two) times daily. Take 1 tablet (500MG) by mouth every morning and 2 tablets (1000MG) by mouth every evening What changed:    how much to take  when to take this  additional instructions  Indication:  Myasthenia Gravis   senna-docusate 8.6-50 MG tablet Commonly known as:  Senokot-S Take 1 tablet by mouth at bedtime as needed for mild constipation. What changed:  when to take this  Indication:  Constipation   testosterone cypionate 200 MG/ML injection Commonly known as:  DEPOTESTOSTERONE CYPIONATE Inject 200 mg into the muscle every 14 (fourteen) days.  Indication:  Deficient Activity of Testis or Ovary   thiamine 100 MG tablet Take 1 tablet (100 mg total) by mouth daily.  Indication:  Deficiency of Vitamin B1   traZODone 100 MG tablet Commonly known as:  DESYREL Take 1 tablet (100 mg total) by mouth at bedtime.  Indication:  [place] Follow up.   Contact information: [place] [address] [phone]           Follow-up recommendations:  Activity:  as tolerated Diet:  low sodium heart healthy Other:  keep follow up appointments  Comments:    Signed: Orson Slick, MD 05/30/2018, 9:33 AM

## 2018-05-30 NOTE — Plan of Care (Signed)
Patient active on the milieu and observed by this Clinical research associate interacting appropriately with staff and peers. Patient compliant with medication administration per MD orders.   Problem: Activity: Goal: Interest or engagement in activities will improve Outcome: Progressing   Problem: Medication: Goal: Compliance with prescribed medication regimen will improve Outcome: Progressing

## 2018-05-30 NOTE — B[facility] Counselor (Signed)
CSW called to assess for status of referral to [facility].  CSW spoke with Shayla, Intake Coordinator, who reports that there will be no bed openings until next week.  Pt has declined a [facility] referral, reporting concerns about not being able to get clothing prior to any possible transportation to facility and not being able to smoke.   Pt [place] [facility] [place]    Pt is requesting residential at this time.  Pt is also requesting discharge.   Penni Homans, MSW, LCSW 05/30/2018 9:18 AM

## 2018-05-30 NOTE — [facility] Suicide Risk Assessment (Signed)
CSW spoke with the patients father who reports that at this time he and his wife are not able to have pt return home at this time due to father having the flu and mother being in the bed with a bad hip and back.  Father is requesting that pt stay at the hospital longer due to their mobility limitations.    Father reports that he would like CSW to speak to Dr. Lillia MountainP.  Wrigley Winborne, MSW, LCSW 05/30/2018 10:50 AM

## 2018-05-30 NOTE — Progress Notes (Signed)
D - Patient was in the day room upon arrival to the unit. Patient was pleasant during assessment and medication administration. Patient denies SI/HI/AVH. Patient rated depression and anxiety 5/10. Patient denies pain at this time. Patient was seen acting appropriately with staff and peers on the unit by this Clinical research associatewriter. Patient   A - Patient was compliant with medication administration per MD orders. Patient given education. Patient given support and encouragement to be active in his treatment plan. Patient informed to let staff know if there are any issues or problems on the unit.   R - Patient verbally contracts for safety with this Clinical research associatewriter. Patient being monitored Q 15 minutes for safety per unit protocol. Patient remains safe at this time.

## 2018-05-30 NOTE — Progress Notes (Signed)
Recreation Therapy Notes  INPATIENT RECREATION TR PLAN  Patient Details Name: Kurt Horton MRN: [id number] DOB: 31-Mar-1961 Today's Date: 05/30/2018  Rec Therapy Plan Is patient appropriate for Therapeutic Recreation?: Yes Treatment times per week: at least 3 Estimated Length of Stay: 5-7 days TR Treatment/Interventions: Group participation (Comment)  Discharge Criteria Pt will be discharged from therapy if:: Discharged Treatment plan/goals/alternatives discussed and agreed upon by:: Patient/family  Discharge Summary Short term goals set: Patient will identify 3 positive replacements for unhealthy/harmful habits within 5 recreation therapy group sessions Short term goals met: Adequate for discharge Progress toward goals comments: Groups attended Which groups?: Other (Comment)(Self-care, Problem Solving) Reason goals not met: N/A Therapeutic equipment acquired: N/A Reason patient discharged from therapy: Discharge from hospital Pt/family agrees with progress & goals achieved: Yes Date patient discharged from therapy: 05/30/18   [name] 05/30/2018, 12:13 PM

## 2018-05-30 NOTE — Progress Notes (Signed)
Recreation Therapy Notes   Date: 05/30/2018  Time: 9:30 am   Location: Craft room   Behavioral response: N/A   Intervention Topic: Time Management  Discussion/Intervention: Patient did not attend group.   Clinical Observations/Feedback:  Patient did not attend group.   Trenesha Alcaide LRT/CTRS         Kiara Keep 05/30/2018 11:38 AM 

## 2018-06-06 NOTE — Discharge Summary (Signed)
[employer] - [place] at [facility]   PATIENT NAME: Kurt Horton    MR#:  [id number]  DATE OF BIRTH:  01/25/1961  DATE OF ADMISSION:  05/16/2018 ADMITTING PHYSICIAN: Campbell Stall, MD  DATE OF DISCHARGE: 05/21/2018 12:30 PM  PRIMARY CARE PHYSICIAN: [name], MD   ADMISSION DIAGNOSIS:  Delirium tremens (HCC) [F10.231] Alcohol withdrawal syndrome with complication (HCC) [F10.239] Acute alcoholic intoxication with complication (HCC) [F10.929]  DISCHARGE DIAGNOSIS:  Active Problems:   Alcohol intoxication (HCC)   SECONDARY DIAGNOSIS:   Past Medical History:  Diagnosis Date  . A-fib (HCC)   . Alcohol abuse   . Alcohol withdrawal (HCC) 11/12/2016  . Diabetes mellitus without complication (HCC)   . DJD (degenerative joint disease)   . Hypertension   . Myasthenia gravis (HCC)   . Renal disorder      ADMITTING HISTORY  HISTORY OF PRESENT ILLNESS:  Kurt Horton  is a 58 y.o. male with a known history of alcohol abuse, A. fib, type 2 diabetes, hypertension who came into the ED with alcohol intoxication.  He was in an [place] this afternoon, when he apparently was noted to be very intoxicated.  The Benedetto Goad driver decided to bring him to the ED for evaluation.  He has a history of multiple readmissions for alcohol intoxication and withdrawal.  He is obviously intoxicated in the ED.  He endorses some chronic low back pain.  Denies any chest pain, palpitations, shortness of breath.  In the ED, he was tachycardic with heart rate 103-143.  He was mildly hypertensive.  Labs were significant for K2.9, WBC 12.3, alcohol 326.  Due to his tachycardia and history of DTs, hospitalists were called for admission.  HOSPITAL COURSE:   *Alcohol withdrawals.  Long history of alcohol abuse.  Counseled to quit. Continued on CIWA protocol with ativan PRN Thiamine, IVF Telemetry monitoring. Slowly improved. Had withdrawals initially but improved well. Discharged home with information  to help with alcohol abuse. Given by Child psychotherapist  No withdrawals at time of discharge.  Has chronic tremors and is anxious  *Chronic paroxysmal atrial fibrillation Currently with normal sinus rhythm Continue amiodarone, diltiazem, and metoprolol  *Acute hypokalemia Repleted  * Diabetes mellitus type 2  Continue sliding scale insulin with Accu-Cheks per routine   *Recent history of subdural hematoma CT head is stable-no acute bleeding noted Continue to avoid anti-platelets/NSAIDs/anticoagulants  *Chronic combined systolic and diastolic heart failure Continue beta-blocker therapy, lisinopril, restart Lasix  *Chronic depression Continue Buspar and Cymbalta  *History of myasthenia gravis- No signs of acute exacerbation Continue CellCept  * Chronic low back pain Followed by pain specialist in [facility]  Continue oxycodone from home.  Stable for discharge home  CONSULTS OBTAINED:    DRUG ALLERGIES:   Allergies  Allergen Reactions  . Nsaids Other (See Comments)    Duodenal ulcers, GI bleeding  . Other Other (See Comments)    Patient states he can't take any "mycin" medications because myasthenia gravis.    DISCHARGE MEDICATIONS:   Allergies as of 05/21/2018      Reactions   Nsaids Other (See Comments)   Duodenal ulcers, GI bleeding   Other Other (See Comments)   Patient states he can't take any "mycin" medications because myasthenia gravis.      Medication List    STOP taking these medications   amiodarone 200 MG tablet Commonly known as:  PACERONE   diltiazem 180 MG 24 hr capsule Commonly known as:  CARDIZEM CD  furosemide 40 MG tablet Commonly known as:  LASIX   lisinopril 10 MG tablet Commonly known as:  PRINIVIL,ZESTRIL   nicotine 14 mg/24hr patch Commonly known as:  NICODERM CQ - dosed in mg/24 hours   oxyCODONE 5 MG immediate release tablet Commonly known as:  Oxy IR/ROXICODONE     TAKE these medications   B-complex with  vitamin C tablet Take 1 tablet by mouth daily.   multivitamin Tabs tablet Take 1 tablet by mouth daily.   senna-docusate 8.6-50 MG tablet Commonly known as:  Senokot-S Take 1 tablet by mouth at bedtime as needed for mild constipation. What changed:  when to take this   testosterone cypionate 200 MG/ML injection Commonly known as:  DEPOTESTOSTERONE CYPIONATE Inject 200 mg into the muscle every 14 (fourteen) days.       Today   VITAL SIGNS:  Blood pressure 124/82, pulse 83, temperature 97.9 F (36.6 C), resp. rate 18, height 5\' 11"  (1.803 m), weight 83 kg, SpO2 100 %.  I/O:  No intake or output data in the 24 hours ending 06/06/18 1224  PHYSICAL EXAMINATION:  Physical Exam  GENERAL:  58 y.o.-year-old patient lying in the bed with no acute distress.  LUNGS: Normal breath sounds bilaterally, no wheezing, rales,rhonchi or crepitation. No use of accessory muscles of respiration.  CARDIOVASCULAR: S1, S2 normal. No murmurs, rubs, or gallops.  ABDOMEN: Soft, non-tender, non-distended. Bowel sounds present. No organomegaly or mass.  NEUROLOGIC: Moves all 4 extremities. PSYCHIATRIC: The patient is alert and oriented x 3.  SKIN: No obvious rash, lesion, or ulcer.   DATA REVIEW:   CBC No results for input(s): WBC, HGB, HCT, PLT in the last 168 hours.  Chemistries  No results for input(s): NA, K, CL, CO2, GLUCOSE, BUN, CREATININE, CALCIUM, MG, AST, ALT, ALKPHOS, BILITOT in the last 168 hours.  Invalid input(s): GFRCGP  Cardiac Enzymes No results for input(s): TROPONINI in the last 168 hours.  Microbiology Results  Results for orders placed or performed during the hospital encounter of 04/15/18  MRSA PCR Screening     Status: None   Collection Time: 04/15/18 11:53 AM  Result Value Ref Range Status   MRSA by PCR NEGATIVE NEGATIVE Final    Comment:        The GeneXpert MRSA Assay (FDA approved for NASAL specimens only), is one component of a comprehensive MRSA  colonization surveillance program. It is not intended to diagnose MRSA infection nor to guide or monitor treatment for MRSA infections. Performed at [facility], [address] [place], [place] [address]     RADIOLOGY:  No results found.  Follow up with PCP in 1 week.  Management plans discussed with the patient, family and they are in agreement.  CODE STATUS:  Code Status History    Date Active Date Inactive Code Status Order ID Comments User Context   05/25/2018 1609 05/30/2018 1913 Full Code [id number]  Luciano CutterSmith, Justin R, DO Inpatient   05/17/2018 0121 05/21/2018 1635 Full Code [id number]  Campbell StallMayo, Katy Dodd, MD Inpatient   04/15/2018 1145 04/19/2018 2132 Full Code [id number]  Delfino LovettShah, Vipul, MD Inpatient   04/03/2018 0547 04/10/2018 2052 Full Code [id number]  Barbaraann RondoSridharan, Prasanna, MD ED   02/04/2018 1528 02/07/2018 1728 Full Code [id number]  Mayo, Allyn KennerKaty Dodd, MD ED   02/03/2018 2233 02/04/2018 0841 Full Code [id number]  Jene EveryKinner, Robert, MD ED   01/20/2018 0140 01/22/2018 1819 Full Code [id number]  Barbaraann RondoSridharan, Prasanna, MD ED   01/19/2018 1239 01/20/2018 0140 Full Code  [id number]  Myrna BlazerSchaevitz, David Matthew, MD ED   12/22/2017 0841 12/25/2017 2031 Full Code [id number]  Barbaraann RondoSridharan, Prasanna, MD Inpatient   10/27/2017 1522 11/02/2017 1432 Full Code [id number]  Milagros LollSudini, Tashonda Pinkus, MD ED   10/21/2017 1945 10/23/2017 1833 Full Code [id number]  Auburn BilberryPatel, Shreyang, MD Inpatient   10/21/2017 1504 10/21/2017 1945 Full Code [id number]  Arnaldo NatalMalinda, Paul F, MD ED   10/10/2017 1314 10/11/2017 1147 Full Code [id number]  Willy Eddyobinson, Patrick, MD ED   10/05/2017 2214 10/07/2017 2101 Full Code [id number]  Arnaldo NatalMalinda, Paul F, MD ED   10/05/2017 1924 10/05/2017 2214 Full Code [id number]  Arnaldo NatalMalinda, Paul F, MD ED   10/01/2017 0807 2020/04/118 1650 Full Code [id number]  Barbaraann RondoSridharan, Prasanna, MD Inpatient   08/24/2017 1756 08/27/2017 1849 Full Code [id number]  Enid BaasKalisetti, Radhika, MD ED   08/14/2017 0030 08/18/2017 1605 Full Code [id number]  Oralia ManisWillis, David, MD Inpatient    06/04/2017 0116 06/06/2017 1701 Full Code [id number]  Oralia ManisWillis, David, MD Inpatient   03/19/2017 1559 03/20/2017 2128 Full Code [id number]  Arnaldo NatalMalinda, Paul F, MD ED   02/25/2017 1421 03/01/2017 1829 Full Code [id number]  Houston SirenSainani, Vivek J, MD Inpatient   11/12/2016 1533 11/14/2016 1425 Full Code [id number]  Marguarite ArbourSparks, Jeffrey D, MD Inpatient   12/26/2015 1927 12/30/2015 1604 Full Code [id number]  Marguarite ArbourSparks, Jeffrey D, MD Inpatient      TOTAL TIME TAKING CARE OF THIS PATIENT ON DAY OF DISCHARGE: more than 30 minutes.   Molinda BailiffSrikar R Azarias Chiou M.D on 06/06/2018 at 12:24 PM  Between 7am to 6pm - Pager - [phone]  After 6pm go to [email or web address] - password [email or web address] [facility]  [facility] [place] [facility]  Office  [phone]  CC: Primary care physician; Barbette ReichmannHande, Vishwanath, MD  Note: This dictation was prepared with Dragon dictation along with smaller phrase technology. Any transcriptional errors that result from this process are unintentional.

## 2018-06-09 ENCOUNTER — Emergency Department
Admission: EM | Admit: 2018-06-09 | Discharge: 2018-06-09 | Disposition: A | Discharge status: Home / Self Care | Payer: Medicaid Other | Attending: Emergency Medicine | Admitting: Emergency Medicine

## 2018-06-09 DIAGNOSIS — F17210 Nicotine dependence, cigarettes, uncomplicated: Secondary | ICD-10-CM | POA: Diagnosis not present

## 2018-06-09 DIAGNOSIS — F10929 Alcohol use, unspecified with intoxication, unspecified: Secondary | ICD-10-CM | POA: Diagnosis present

## 2018-06-09 DIAGNOSIS — I10 Essential (primary) hypertension: Secondary | ICD-10-CM | POA: Insufficient documentation

## 2018-06-09 DIAGNOSIS — Z7984 Long term (current) use of oral hypoglycemic drugs: Secondary | ICD-10-CM | POA: Diagnosis not present

## 2018-06-09 DIAGNOSIS — I110 Hypertensive heart disease with heart failure: Secondary | ICD-10-CM | POA: Insufficient documentation

## 2018-06-09 DIAGNOSIS — Y908 Blood alcohol level of 240 mg/100 ml or more: Secondary | ICD-10-CM | POA: Insufficient documentation

## 2018-06-09 DIAGNOSIS — I5042 Chronic combined systolic (congestive) and diastolic (congestive) heart failure: Secondary | ICD-10-CM | POA: Insufficient documentation

## 2018-06-09 DIAGNOSIS — Z79899 Other long term (current) drug therapy: Secondary | ICD-10-CM | POA: Insufficient documentation

## 2018-06-09 DIAGNOSIS — E119 Type 2 diabetes mellitus without complications: Secondary | ICD-10-CM | POA: Diagnosis not present

## 2018-06-09 DIAGNOSIS — F10920 Alcohol use, unspecified with intoxication, uncomplicated: Secondary | ICD-10-CM | POA: Diagnosis not present

## 2018-06-09 LAB — CBC
HEMATOCRIT: 35.7 % — AB (ref 39.0–52.0)
Hemoglobin: 11.3 g/dL — ABNORMAL LOW (ref 13.0–17.0)
MCH: 27.3 pg (ref 26.0–34.0)
MCHC: 31.7 g/dL (ref 30.0–36.0)
MCV: 86.2 fL (ref 80.0–100.0)
Platelets: 299 10*3/uL (ref 150–400)
RBC: 4.14 MIL/uL — ABNORMAL LOW (ref 4.22–5.81)
RDW: 21.6 % — ABNORMAL HIGH (ref 11.5–15.5)
WBC: 6 10*3/uL (ref 4.0–10.5)
nRBC: 0 % (ref 0.0–0.2)

## 2018-06-09 LAB — COMPREHENSIVE METABOLIC PANEL
ALK PHOS: 78 U/L (ref 38–126)
ALT: 16 U/L (ref 0–44)
AST: 21 U/L (ref 15–41)
Albumin: 3.3 g/dL — ABNORMAL LOW (ref 3.5–5.0)
Anion gap: 9 (ref 5–15)
BUN: 5 mg/dL — ABNORMAL LOW (ref 6–20)
CO2: 27 mmol/L (ref 22–32)
Calcium: 8.1 mg/dL — ABNORMAL LOW (ref 8.9–10.3)
Chloride: 105 mmol/L (ref 98–111)
Creatinine, Ser: 0.62 mg/dL (ref 0.61–1.24)
GFR calc Af Amer: >60 mL/min (ref >60)
GFR calc non Af Amer: >60 mL/min (ref >60)
Glucose, Bld: 122 mg/dL — ABNORMAL HIGH (ref 70–99)
Potassium: 3 mmol/L — ABNORMAL LOW (ref 3.5–5.1)
Sodium: 141 mmol/L (ref 135–145)
Total Bilirubin: 0.6 mg/dL (ref 0.3–1.2)
Total Protein: 6 g/dL — ABNORMAL LOW (ref 6.5–8.1)

## 2018-06-09 LAB — LIPASE, BLOOD: Lipase: 35 U/L (ref 11–51)

## 2018-06-09 LAB — ETHANOL: Alcohol, Ethyl (B): 315 mg/dL (ref <10)

## 2018-06-09 MED ORDER — ACETAMINOPHEN 325 MG PO TABS: 650.0000 mg | ORAL_TABLET | Freq: Once | ORAL | Status: DC

## 2018-06-09 MED ORDER — THIAMINE HCL 100 MG/ML IJ SOLN: 100.0000 mg | Freq: Every day | INTRAMUSCULAR | Status: DC

## 2018-06-09 MED ORDER — LORAZEPAM 2 MG/ML IJ SOLN
0.0000 mg | Freq: Four times a day (QID) | INTRAMUSCULAR | Status: DC
  Administered 2018-06-09: 2 mg via INTRAVENOUS
  Filled 2018-06-09: qty 1

## 2018-06-09 MED ORDER — THIAMINE HCL 100 MG/ML IJ SOLN
Freq: Once | INTRAVENOUS | Status: AC
  Administered 2018-06-09: 02:00:00 via INTRAVENOUS
  Filled 2018-06-09: qty 1000

## 2018-06-09 NOTE — ED Notes (Signed)
ED  Is the patient under IVC or is there intent for IVC:  no Is the patient medically cleared: Yes.   Is there vacancy in the ED BHU: Yes.   Is the population mix appropriate for patient: Yes.   Is the patient awaiting placement in inpatient or outpatient setting:  Has the patient had a psychiatric consult:  Pt discharged    Survey of unit performed for contraband, property placement and condition of furniture, tampering with fixtures in bathroom, shower, and each patient room: Yes.  ; Findings:  APPEARANCE/BEHAVIOR Calm and cooperative NEURO ASSESSMENT Orientation: oriented x3  Denies pain Hallucinations: No.None noted (Hallucinations) denies Speech: Normal Gait: normal RESPIRATORY ASSESSMENT Even  Unlabored respirations  CARDIOVASCULAR ASSESSMENT Pulses equal   regular rate  Skin warm and dry   GASTROINTESTINAL ASSESSMENT no GI complaint EXTREMITIES Full ROM  PLAN OF CARE Provide calm/safe environment. Vital signs assessed twice daily. ED BHU Assessment once each 12-hour shift. Collaborate with TTS daily or as condition indicates. Assure the ED provider has rounded once each shift. Provide and encourage hygiene. Provide redirection as needed. Assess for escalating behavior; address immediately and inform ED provider.  Assess family dynamic and appropriateness for visitation as needed: Yes.  ; If necessary, describe findings:  Educate the patient/family about BHU procedures/visitation: Yes.  ; If necessary, describe findings:

## 2018-06-09 NOTE — ED Notes (Signed)
Patient observed lying in bed with eyes closed  Even, unlabored respirations observed   NAD pt appears to be sleeping  I will continue to monitor along with every 15 minute visual observations and ongoing security monitoring    

## 2018-06-09 NOTE — ED Triage Notes (Signed)
Patient brought in by [facility] EMS for alcohol intoxication.  EMS reports that patient's parents called them because patient was drunk.  Upon arrival to home, patient was asleep in a chair in the living room but parents still wanted him to come to hospital.  Patient was cooperative.

## 2018-06-09 NOTE — Discharge Instructions (Addendum)
Call the number provided if you desire detox from alcohol. Return to the ER for worsening symptoms, persistent vomiting, difficulty breathing or other concerns.

## 2018-06-09 NOTE — ED Notes (Signed)
Patient given a cup of soda by request.

## 2018-06-09 NOTE — ED Provider Notes (Signed)
[facility] Emergency Department Provider Note   ____________________________________________   First MD Initiated Contact with Patient 06/09/18 [phone]     (approximate)  I have reviewed the triage vital signs and the nursing notes.   HISTORY  Chief Complaint Alcohol Intoxication  Level V caveat: Limited by intoxication  HPI Kurt Horton is a 58 y.o. male brought to the ED from home via EMS with a chief complaint of alcohol intoxication.  Patient is a recalcitrant alcoholic who lives with his parents.  They found patient asleep in the living room, heavily intoxicated and called 911 to transport him to the hospital.  Patient arrives to the hospital cooperative.  Voices no complaints.    Past Medical History:  Diagnosis Date  . A-fib (HCC)   . Alcohol abuse   . Alcohol withdrawal (HCC) 11/12/2016  . Diabetes mellitus without complication (HCC)   . DJD (degenerative joint disease)   . Hypertension   . Myasthenia gravis (HCC)   . Renal disorder     Patient Active Problem List   Diagnosis Date Noted  . Tobacco use disorder 05/27/2018  . GI bleed 02/04/2018  . Alcohol intoxication (HCC) 12/22/2017  . Malnutrition of moderate degree 10/04/2017  . Chronic combined systolic and diastolic CHF (congestive heart failure) (HCC) 08/13/2017  . Alcohol withdrawal delirium (HCC) 02/26/2017  . Atrial fibrillation with RVR (HCC) 02/25/2017  . Acute alcoholic intoxication with complication (HCC)   . Alcohol use disorder, severe, dependence (HCC)   . Alcohol withdrawal (HCC) 11/13/2016  . Substance induced mood disorder (HCC) 11/13/2016  . SVT (supraventricular tachycardia) (HCC) 11/12/2016  . HTN (hypertension) 11/12/2016  . EtOH dependence (HCC) 11/12/2016  . Lumbar radiculopathy 08/24/2016  . Major depressive disorder, recurrent severe without psychotic features (HCC) 08/24/2016  . Chronic low back pain 01/04/2016  . Pressure ulcer 12/29/2015  .  Osteomyelitis (HCC) 12/26/2015  . PVD (peripheral vascular disease) (HCC) 12/26/2015  . Type II diabetes mellitus with manifestations (HCC) 12/26/2015  . Myasthenia gravis (HCC) 12/26/2015    Past Surgical History:  Procedure Laterality Date  . AMPUTATION TOE Left 12/29/2015   Procedure: AMPUTATION TOE;  Surgeon: Gwyneth RevelsJustin Fowler, DPM;  Location: [facility] ORS;  Service: Podiatry;  Laterality: Left;  . ESOPHAGOGASTRODUODENOSCOPY N/A 02/05/2018   Procedure: ESOPHAGOGASTRODUODENOSCOPY (EGD);  Surgeon: Pasty Spillersahiliani, Varnita B, MD;  [facility] ENDOSCOPY;  Service: Endoscopy;  Laterality: N/A;  . PERIPHERAL VASCULAR CATHETERIZATION N/A 12/27/2015   Procedure: Lower Extremity Angiography;  Surgeon: Annice NeedyJason S Dew, MD;  Location: [facility] INVASIVE CV LAB;  Service: Cardiovascular;  Laterality: N/A;  . right hip surgery    . right shoulder surgery      Prior to Admission medications   Medication Sig Start Date End Date Taking? Authorizing Provider  B Complex-C (B-COMPLEX WITH VITAMIN C) tablet Take 1 tablet by mouth daily. 11/02/17   Alford HighlandWieting, Richard, MD  cloNIDine (CATAPRES - DOSED IN MG/24 HR) 0.1 mg/24hr patch Place 1 patch (0.1 mg total) onto the skin once a week. 05/30/18   Pucilowska, Braulio ConteJolanta B, MD  gabapentin (NEURONTIN) 300 MG capsule Take 2 capsules (600 mg total) by mouth 3 (three) times daily. 05/30/18   Pucilowska, Braulio ConteJolanta B, MD  metFORMIN (GLUCOPHAGE) 500 MG tablet Take 1 tablet (500 mg total) by mouth daily. 05/30/18   Pucilowska, Braulio ConteJolanta B, MD  metoprolol succinate (TOPROL-XL) 50 MG 24 hr tablet Take 1 tablet (50 mg total) by mouth daily. Take with or immediately following a meal. 05/30/18   Pucilowska,  Jolanta B, MD  mirtazapine (REMERON) 15 MG tablet Take 1 tablet (15 mg total) by mouth at bedtime. 05/30/18   Pucilowska, Braulio Conte B, MD  multivitamin (ONE-A-DAY MEN'S) TABS tablet Take 1 tablet by mouth daily. 04/19/18   Shaune Pollack, MD  mycophenolate (CELLCEPT) 500 MG tablet Take 1 tablet (500 mg total) by  mouth 2 (two) times daily. Take 1 tablet (500MG ) by mouth every morning and 2 tablets (1000MG ) by mouth every evening 05/30/18   Pucilowska, Jolanta B, MD  senna-docusate (SENOKOT-S) 8.6-50 MG tablet Take 1 tablet by mouth at bedtime as needed for mild constipation. Patient taking differently: Take 1 tablet by mouth daily.  12/25/17   Mayo, Allyn Kenner, MD  testosterone cypionate (DEPOTESTOSTERONE CYPIONATE) 200 MG/ML injection Inject 200 mg into the muscle every 14 (fourteen) days.    [provider]  thiamine 100 MG tablet Take 1 tablet (100 mg total) by mouth daily. 05/30/18   Pucilowska, Braulio Conte B, MD  traZODone (DESYREL) 100 MG tablet Take 1 tablet (100 mg total) by mouth at bedtime. 05/30/18   Pucilowska, Ellin Goodie, MD    Allergies Nsaids and Other  Family History  Problem Relation Age of Onset  . Rheum arthritis Father     Social History Social History   Tobacco Use  . Smoking status: Current Every Day Smoker    Packs/day: 0.50    Types: Cigarettes  . Smokeless tobacco: Never Used  Substance Use Topics  . Alcohol use: Yes    Alcohol/week: 20.0 - 30.0 standard drinks    Types: 20 - 30 Shots of liquor per week    Comment: Pt had 20 airplane bottles today  . Drug use: Yes    Types: Oxycodone    Review of Systems  Constitutional: Positive for alcohol intoxication.  No fever/chills Eyes: No visual changes. ENT: No sore throat. Cardiovascular: Denies chest pain. Respiratory: Denies shortness of breath. Gastrointestinal: No abdominal pain.  No nausea, no vomiting.  No diarrhea.  No constipation. Genitourinary: Negative for dysuria. Musculoskeletal: Negative for back pain. Skin: Negative for rash. Neurological: Negative for headaches, focal weakness or numbness.   ____________________________________________   PHYSICAL EXAM:  VITAL SIGNS: ED Triage Vitals  Enc Vitals Group     BP 06/09/18 0033 111/61     Pulse Rate 06/09/18 0033 92     Resp 06/09/18 0033 20       Temp 06/09/18 0033 98.4 F (36.9 C)     Temp Source 06/09/18 0033 Oral     SpO2 06/09/18 0033 95 %     Weight --      Height --      Head Circumference --      Peak Flow --      Pain Score 06/09/18 0027 Asleep     Pain Loc --      Pain Edu? --      Excl. in GC? --     Constitutional: Asleep, awakened for exam.  Alert and oriented. Well appearing and in no acute distress. Eyes: Conjunctivae are normal. PERRL. EOMI. Head: Atraumatic. Nose: Atraumatic. Mouth/Throat: Mucous membranes are moist.  No dental malocclusion. Neck: No stridor.  No cervical spine tenderness to palpation. Cardiovascular: Normal rate, regular rhythm. Grossly normal heart sounds.  Good peripheral circulation. Respiratory: Normal respiratory effort.  No retractions. Lungs CTAB. Gastrointestinal: Soft and nontender. No distention. No abdominal bruits. No CVA tenderness. Musculoskeletal: No lower extremity tenderness nor edema.  No joint effusions. Neurologic:  Normal speech and language.  No gross focal neurologic deficits are appreciated.  Skin:  Skin is warm, dry and intact. No rash noted. Psychiatric: Mood and affect are normal. Speech and behavior are normal.  ____________________________________________   LABS (all labs ordered are listed, but only abnormal results are displayed)  Labs Reviewed  COMPREHENSIVE METABOLIC PANEL - Abnormal; Notable for the following components:      Result Value   Potassium 3.0 (*)    Glucose, Bld 122 (*)    BUN 5 (*)    Calcium 8.1 (*)    Total Protein 6.0 (*)    Albumin 3.3 (*)    All other components within normal limits  ETHANOL - Abnormal; Notable for the following components:   Alcohol, Ethyl (B) 315 (*)    All other components within normal limits  CBC - Abnormal; Notable for the following components:   RBC 4.14 (*)    Hemoglobin 11.3 (*)    HCT 35.7 (*)    RDW 21.6 (*)    All other components within normal limits  LIPASE, BLOOD  URINE DRUG SCREEN,  QUALITATIVE ([facility] ONLY)   ____________________________________________  EKG  None ____________________________________________  RADIOLOGY  ED MD interpretation: None  Official radiology report(s): No results found.  ____________________________________________   PROCEDURES  Procedure(s) performed: None  Procedures  Critical Care performed: No  ____________________________________________   INITIAL IMPRESSION / ASSESSMENT AND PLAN / ED COURSE  As part of my medical decision making, I reviewed the following data within the electronic MEDICAL RECORD NUMBER History obtained from family, Labs reviewed, Old chart reviewed and Notes from prior ED visits    58 year old alcoholic brought to the ED for intoxication.  Cooperative and nonsuicidal.  Will initiate IV fluid resuscitation.  Place on CIWA protocol as patient has had DTs in the past.  Will observe in the emergency department.   Clinical Course as of Jun 09 698  Sun Jun 09, 2018  2035 Patient awake, alert, sipping on drink.  Complains of lower back pain which is chronic.  Will order Tylenol.  He will call for a ride home.  No evidence of DTs.  Strict return precautions given.  Patient verbalizes understanding agrees with plan of care.   [JS]    Clinical Course User Index [JS] Irean Hong, MD     ____________________________________________   FINAL CLINICAL IMPRESSION(S) / ED DIAGNOSES  Final diagnoses:  Alcoholic intoxication without complication [facility])     ED Discharge Orders    None       Note:  This document was prepared using Dragon voice recognition software and may include unintentional dictation errors.    Irean Hong, MD 06/09/18 [phone]

## 2018-06-09 NOTE — ED Notes (Signed)
He is awake - sitting up and eating breakfast tray  Informed him that he is discharged  He reports  "I don't have a way home"  Phone provided so that he may call family/friends for a ride   Continue to monitor

## 2018-06-09 NOTE — ED Notes (Signed)

## 2018-06-10 ENCOUNTER — Encounter: Payer: Self-pay | Admitting: Emergency Medicine

## 2018-06-10 ENCOUNTER — Other Ambulatory Visit: Payer: Self-pay

## 2018-06-10 ENCOUNTER — Emergency Department
Admission: EM | Admit: 2018-06-10 | Discharge: 2018-06-10 | Disposition: A | Discharge status: Home / Self Care | Payer: Medicaid Other | Attending: Emergency Medicine | Admitting: Emergency Medicine

## 2018-06-10 DIAGNOSIS — F10920 Alcohol use, unspecified with intoxication, uncomplicated: Secondary | ICD-10-CM | POA: Insufficient documentation

## 2018-06-10 DIAGNOSIS — I5042 Chronic combined systolic (congestive) and diastolic (congestive) heart failure: Secondary | ICD-10-CM | POA: Insufficient documentation

## 2018-06-10 DIAGNOSIS — E119 Type 2 diabetes mellitus without complications: Secondary | ICD-10-CM | POA: Diagnosis not present

## 2018-06-10 DIAGNOSIS — F10239 Alcohol dependence with withdrawal, unspecified: Secondary | ICD-10-CM | POA: Diagnosis present

## 2018-06-10 DIAGNOSIS — F10939 Alcohol use, unspecified with withdrawal, unspecified: Secondary | ICD-10-CM | POA: Diagnosis present

## 2018-06-10 DIAGNOSIS — F331 Major depressive disorder, recurrent, moderate: Secondary | ICD-10-CM | POA: Diagnosis not present

## 2018-06-10 DIAGNOSIS — F17210 Nicotine dependence, cigarettes, uncomplicated: Secondary | ICD-10-CM | POA: Diagnosis not present

## 2018-06-10 DIAGNOSIS — F329 Major depressive disorder, single episode, unspecified: Secondary | ICD-10-CM

## 2018-06-10 DIAGNOSIS — I10 Essential (primary) hypertension: Secondary | ICD-10-CM | POA: Insufficient documentation

## 2018-06-10 DIAGNOSIS — E876 Hypokalemia: Secondary | ICD-10-CM | POA: Insufficient documentation

## 2018-06-10 DIAGNOSIS — F1010 Alcohol abuse, uncomplicated: Secondary | ICD-10-CM

## 2018-06-10 DIAGNOSIS — Z79899 Other long term (current) drug therapy: Secondary | ICD-10-CM | POA: Insufficient documentation

## 2018-06-10 DIAGNOSIS — F1099 Alcohol use, unspecified with unspecified alcohol-induced disorder: Secondary | ICD-10-CM | POA: Diagnosis present

## 2018-06-10 LAB — CBC
HCT: 37.1 % — ABNORMAL LOW (ref 39.0–52.0)
Hemoglobin: 11.8 g/dL — ABNORMAL LOW (ref 13.0–17.0)
MCH: 27.4 pg (ref 26.0–34.0)
MCHC: 31.8 g/dL (ref 30.0–36.0)
MCV: 86.3 fL (ref 80.0–100.0)
Platelets: 333 10*3/uL (ref 150–400)
RBC: 4.3 MIL/uL (ref 4.22–5.81)
RDW: 21.8 % — ABNORMAL HIGH (ref 11.5–15.5)
WBC: 5.4 10*3/uL (ref 4.0–10.5)
nRBC: 0 % (ref 0.0–0.2)

## 2018-06-10 LAB — COMPREHENSIVE METABOLIC PANEL
ALT: 16 U/L (ref 0–44)
AST: 18 U/L (ref 15–41)
Albumin: 3.5 g/dL (ref 3.5–5.0)
Alkaline Phosphatase: 79 U/L (ref 38–126)
Anion gap: 8 (ref 5–15)
CO2: 30 mmol/L (ref 22–32)
Calcium: 8.7 mg/dL — ABNORMAL LOW (ref 8.9–10.3)
Chloride: 105 mmol/L (ref 98–111)
Creatinine, Ser: 0.73 mg/dL (ref 0.61–1.24)
GFR calc Af Amer: >60 mL/min (ref >60)
GFR calc non Af Amer: >60 mL/min (ref >60)
Glucose, Bld: 106 mg/dL — ABNORMAL HIGH (ref 70–99)
Potassium: 3.1 mmol/L — ABNORMAL LOW (ref 3.5–5.1)
Sodium: 143 mmol/L (ref 135–145)
Total Bilirubin: 0.6 mg/dL (ref 0.3–1.2)
Total Protein: 6.6 g/dL (ref 6.5–8.1)

## 2018-06-10 LAB — URINE DRUG SCREEN, QUALITATIVE (ARMC ONLY)
AMPHETAMINES, UR SCREEN: NOT DETECTED
Barbiturates, Ur Screen: POSITIVE — AB
Benzodiazepine, Ur Scrn: POSITIVE — AB
COCAINE METABOLITE, UR ~~LOC~~: NOT DETECTED
Cannabinoid 50 Ng, Ur ~~LOC~~: NOT DETECTED
MDMA (Ecstasy)Ur Screen: NOT DETECTED
Methadone Scn, Ur: NOT DETECTED
Opiate, Ur Screen: NOT DETECTED
PHENCYCLIDINE (PCP) UR S: NOT DETECTED
Tricyclic, Ur Screen: NOT DETECTED

## 2018-06-10 LAB — ACETAMINOPHEN LEVEL: Acetaminophen (Tylenol), Serum: <10 ug/mL — ABNORMAL LOW (ref 10–30)

## 2018-06-10 LAB — ETHANOL: Alcohol, Ethyl (B): 290 mg/dL — ABNORMAL HIGH (ref <10)

## 2018-06-10 LAB — SALICYLATE LEVEL: Salicylate Lvl: <7 mg/dL (ref 2.8–30.0)

## 2018-06-10 MED ORDER — LIDOCAINE 5 % EX PTCH
1.0000 | MEDICATED_PATCH | CUTANEOUS | Status: DC
  Administered 2018-06-10: 1 via TRANSDERMAL
  Filled 2018-06-10: qty 1

## 2018-06-10 MED ORDER — THIAMINE HCL 100 MG/ML IJ SOLN
Freq: Once | INTRAVENOUS | Status: AC
  Administered 2018-06-10: 02:00:00 via INTRAVENOUS
  Filled 2018-06-10: qty 1000

## 2018-06-10 MED ORDER — POTASSIUM CHLORIDE CRYS ER 20 MEQ PO TBCR
40.0000 meq | EXTENDED_RELEASE_TABLET | Freq: Once | ORAL | Status: AC
  Administered 2018-06-10: 40 meq via ORAL
  Filled 2018-06-10: qty 2

## 2018-06-10 MED ORDER — THIAMINE HCL 100 MG/ML IJ SOLN
100.0000 mg | Freq: Every day | INTRAMUSCULAR | Status: DC
  Filled 2018-06-10: qty 2

## 2018-06-10 MED ORDER — LORAZEPAM 2 MG/ML IJ SOLN
0.0000 mg | Freq: Four times a day (QID) | INTRAMUSCULAR | Status: DC
  Administered 2018-06-10: 2 mg via INTRAVENOUS
  Filled 2018-06-10: qty 1

## 2018-06-10 NOTE — Consult Note (Signed)
[facility] Face-to-Face Psychiatry Consult   Reason for Consult: Follow-up consult for this patient with alcohol abuse and delirium tremens Referring Physician: Dr. Roxan Hockey Patient Identification: Kurt Horton MRN:  [id number] Principal Diagnosis: <principal problem not specified> Diagnosis:  Active Problems:   * No active hospital problems. *   Total Time spent with patient: 30 minutes  Subjective:  "I'm fine"  Kurt Horton is a 58 y.o. male patient    HPI:  Per Kurt Murdoch, NP Kurt Horton is a 58 y.o. male who presents with alcohol intoxication. Symptom onset has been  for a time period of years. Patient chart reviewed and consulted with Dr. Viviano Simas on patient treatment plan. During evaluation Mr. Kurt Horton is alert and orientated x 4: calm and cooperative; and mood congruent with affect. He does not appear to be responding to internal/ external stimuli or delusional thoughts. Patient denies suicidal/ self-harm/ homicidal ideation, psychosis, and paranoia. Patient answered all questions appropriately.  Patient states lots of his drinking stem from his physical problems. He states that in order to help his pain he drinks and tend to drink more that he should. The patient did admit to inpatient here at [facility] about 3 weeks ago, but he felt he did not think it was helpful and he left on his 5th day of inpatient. The patient stated that he drank 20 of the "air plane" bottles of alcohol and was brought in by an Biomedical scientist.  He denies SI, HI, AVH.  In collaboration with the patient farther Kurt Horton) he acknowledges that he dose not want him back to his home, but he knows the patient has nowhere else to go. Mr. Kurt Horton is in agreement to discharge the patient back home,  he wants him to be referred to out patient services that is able to assist him in recovery. The patient farther stated that it is hard for the patient due to him having lots of physical problems.  Therefore; making it difficult for him due to him not being able to work. The patient farther is aware of the situation and he is unsure of what he can do to assist his son.  Past Psychiatric History: Depression but mainly a long history of alcohol abuse without any recent ability to stay sober meanwhile with his physical compromise getting worse  Risk to Self:  No Risk to Others:  No Prior Inpatient Therapy:  Yes Prior Outpatient Therapy:  Yes  Past Medical History:  Past Medical History:  Diagnosis Date  . A-fib (HCC)   . Alcohol abuse   . Alcohol withdrawal (HCC) 11/12/2016  . Diabetes mellitus without complication (HCC)   . DJD (degenerative joint disease)   . Hypertension   . Myasthenia gravis (HCC)   . Renal disorder     Past Surgical History:  Procedure Laterality Date  . AMPUTATION TOE Left 12/29/2015   Procedure: AMPUTATION TOE;  Surgeon: Kurt Horton, DPM;  Location: [facility] ORS;  Service: Podiatry;  Laterality: Left;  . ESOPHAGOGASTRODUODENOSCOPY N/A 02/05/2018   Procedure: ESOPHAGOGASTRODUODENOSCOPY (EGD);  Surgeon: Kurt Spillers, MD;  Location: [facility] ENDOSCOPY;  Service: Endoscopy;  Laterality: N/A;  . PERIPHERAL VASCULAR CATHETERIZATION N/A 12/27/2015   Procedure: Lower Extremity Angiography;  Surgeon: Kurt Needy, MD;  Location: [facility] INVASIVE CV LAB;  Service: Cardiovascular;  Laterality: N/A;  . right hip surgery    . right shoulder surgery     Family History:  Family History  Problem Relation Age of Onset  .  Rheum arthritis Father    Family Psychiatric  History: See previous notes Social History:  Social History   Substance and Sexual Activity  Alcohol Use Yes  . Alcohol/week: 20.0 - 30.0 standard drinks  . Types: 20 - 30 Shots of liquor per week   Comment: Pt had 20 airplane bottles today     Social History   Substance and Sexual Activity  Drug Use Yes  . Types: Oxycodone    Social History   Socioeconomic History  . Marital status: Divorced     Spouse name: Not on file  . Number of children: Not on file  . Years of education: Not on file  . Highest education level: Not on file  Occupational History  . Not on file  Social Needs  . Financial resource strain: Not on file  . Food insecurity:    Worry: Not on file    Inability: Not on file  . Transportation needs:    Medical: Not on file    Non-medical: Not on file  Tobacco Use  . Smoking status: Current Every Day Smoker    Packs/day: 0.50    Types: Cigarettes  . Smokeless tobacco: Never Used  Substance and Sexual Activity  . Alcohol use: Yes    Alcohol/week: 20.0 - 30.0 standard drinks    Types: 20 - 30 Shots of liquor per week    Comment: Pt had 20 airplane bottles today  . Drug use: Yes    Types: Oxycodone  . Sexual activity: Not on file  Lifestyle  . Physical activity:    Days per week: Not on file    Minutes per session: Not on file  . Stress: Not on file  Relationships  . Social connections:    Talks on phone: Not on file    Gets together: Not on file    Attends religious service: Not on file    Active member of club or organization: Not on file    Attends meetings of clubs or organizations: Not on file    Relationship status: Not on file  Other Topics Concern  . Not on file  Social History Narrative   Live with Parents, ambulates with a cane. Falls lately.   Additional Social History: Lives with parents in their 67s. Currently unemployed, on disability    Allergies:   Allergies  Allergen Reactions  . Nsaids Other (See Comments)    Duodenal ulcers, GI bleeding  . Other Other (See Comments)    Patient states he can't take any "mycin" medications because myasthenia gravis.    Labs:  Results for orders placed or performed during the hospital encounter of 06/10/18 (from the past 48 hour(s))  Comprehensive metabolic panel     Status: Abnormal   Collection Time: 06/10/18 12:45 AM  Result Value Ref Range   Sodium 143 135 - 145 mmol/L   Potassium  3.1 (L) 3.5 - 5.1 mmol/L   Chloride 105 98 - 111 mmol/L   CO2 30 22 - 32 mmol/L   Glucose, Bld 106 (H) 70 - 99 mg/dL   BUN <5 (L) 6 - 20 mg/dL   Creatinine, Ser 9.16 0.61 - 1.24 mg/dL   Calcium 8.7 (L) 8.9 - 10.3 mg/dL   Total Protein 6.6 6.5 - 8.1 g/dL   Albumin 3.5 3.5 - 5.0 g/dL   AST 18 15 - 41 U/L   ALT 16 0 - 44 U/L   Alkaline Phosphatase 79 38 - 126 U/L   Total Bilirubin  0.6 0.3 - 1.2 mg/dL   GFR calc non Af Amer >60 >60 mL/min   GFR calc Af Amer >60 >60 mL/min   Anion gap 8 5 - 15    Comment: Performed at [facility], [address] [place], [place] [address]  Ethanol     Status: Abnormal   Collection Time: 06/10/18 12:45 AM  Result Value Ref Range   Alcohol, Ethyl (B) 290 (H) <10 mg/dL    Comment: (NOTE) Lowest detectable limit for serum alcohol is 10 mg/dL. For medical purposes only. Performed at [facility], [address] [place], [place] [address]   cbc     Status: Abnormal   Collection Time: 06/10/18 12:45 AM  Result Value Ref Range   WBC 5.4 4.0 - 10.5 K/uL   RBC 4.30 4.22 - 5.81 MIL/uL   Hemoglobin 11.8 (L) 13.0 - 17.0 g/dL   HCT 69.637.1 (L) 29.539.0 - 28.452.0 %   MCV 86.3 80.0 - 100.0 fL   MCH 27.4 26.0 - 34.0 pg   MCHC 31.8 30.0 - 36.0 g/dL   RDW 13.221.8 (H) 44.011.5 - 10.215.5 %   Platelets 333 150 - 400 K/uL   nRBC 0.0 0.0 - 0.2 %    Comment: Performed at [facility], [address] [place], [place] [address]  Acetaminophen level     Status: Abnormal   Collection Time: 06/10/18 12:45 AM  Result Value Ref Range   Acetaminophen (Tylenol), Serum <10 (L) 10 - 30 ug/mL    Comment: (NOTE) Therapeutic concentrations vary significantly. A range of 10-30 ug/mL  may be an effective concentration for many patients. However, some  are best treated at concentrations outside of this range. Acetaminophen concentrations >150 ug/mL at 4 hours after ingestion  and >50 ug/mL at 12 hours after ingestion are often associated with  toxic  reactions. Performed at [facility], [address] [place], [place] [address]   Salicylate level     Status: None   Collection Time: 06/10/18 12:45 AM  Result Value Ref Range   Salicylate Lvl <7.0 2.8 - 30.0 mg/dL    Comment: Performed at [facility], [address] [place], [place] [address]  Urine Drug Screen, Qualitative     Status: Abnormal   Collection Time: 06/10/18  6:12 AM  Result Value Ref Range   Tricyclic, Ur Screen NONE DETECTED NONE DETECTED   Amphetamines, Ur Screen NONE DETECTED NONE DETECTED   MDMA (Ecstasy)Ur Screen NONE DETECTED NONE DETECTED   Cocaine Metabolite,Ur [place] NONE DETECTED NONE DETECTED   Opiate, Ur Screen NONE DETECTED NONE DETECTED   Phencyclidine (PCP) Ur S NONE DETECTED NONE DETECTED   Cannabinoid 50 Ng, Ur [place] NONE DETECTED NONE DETECTED   Barbiturates, Ur Screen POSITIVE (A) NONE DETECTED   Benzodiazepine, Ur Scrn POSITIVE (A) NONE DETECTED   Methadone Scn, Ur NONE DETECTED NONE DETECTED    Comment: (NOTE) Tricyclics + metabolites, urine    Cutoff 1000 ng/mL Amphetamines + metabolites, urine  Cutoff 1000 ng/mL MDMA (Ecstasy), urine              Cutoff 500 ng/mL Cocaine Metabolite, urine          Cutoff 300 ng/mL Opiate + metabolites, urine        Cutoff 300 ng/mL Phencyclidine (PCP), urine         Cutoff 25 ng/mL Cannabinoid, urine                 Cutoff  50 ng/mL Barbiturates + metabolites, urine  Cutoff 200 ng/mL Benzodiazepine, urine              Cutoff 200 ng/mL Methadone, urine                   Cutoff 300 ng/mL The urine drug screen provides only a preliminary, unconfirmed analytical test result and should not be used for non-medical purposes. Clinical consideration and professional judgment should be applied to any positive drug screen result due to possible interfering substances. A more specific alternate chemical method must be used in order to obtain a confirmed analytical result. Gas chromatography / mass  spectrometry (GC/MS) is the preferred confirmat ory method. Performed at [facility], [address] [place], [place] [address]     Current Facility-Administered Medications  Medication Dose Route Frequency Provider Last Rate Last Dose  . lidocaine (LIDODERM) 5 % 1 patch  1 patch Transdermal Q24H Willy Eddy, MD   1 patch at 06/10/18 0945  . LORazepam (ATIVAN) injection 0-4 mg  0-4 mg Intravenous Q6H Irean Hong, MD   2 mg at 06/10/18 0755  . thiamine (B-1) injection 100 mg  100 mg Intravenous Daily Irean Hong, MD       Current Outpatient Medications  Medication Sig Dispense Refill  . B Complex-C (B-COMPLEX WITH VITAMIN C) tablet Take 1 tablet by mouth daily. 30 tablet 0  . cloNIDine (CATAPRES - DOSED IN MG/24 HR) 0.1 mg/24hr patch Place 1 patch (0.1 mg total) onto the skin once a week. 4 patch 12  . gabapentin (NEURONTIN) 300 MG capsule Take 2 capsules (600 mg total) by mouth 3 (three) times daily. 180 capsule 0  . metFORMIN (GLUCOPHAGE) 500 MG tablet Take 1 tablet (500 mg total) by mouth daily. 30 tablet 1  . metoprolol succinate (TOPROL-XL) 50 MG 24 hr tablet Take 1 tablet (50 mg total) by mouth daily. Take with or immediately following a meal. 30 tablet 1  . mirtazapine (REMERON) 15 MG tablet Take 1 tablet (15 mg total) by mouth at bedtime. 30 tablet 1  . multivitamin (ONE-A-DAY MEN'S) TABS tablet Take 1 tablet by mouth daily. 30 tablet 1  . mycophenolate (CELLCEPT) 500 MG tablet Take 1 tablet (500 mg total) by mouth 2 (two) times daily. Take 1 tablet (500MG ) by mouth every morning and 2 tablets (1000MG ) by mouth every evening 90 tablet 1  . senna-docusate (SENOKOT-S) 8.6-50 MG tablet Take 1 tablet by mouth at bedtime as needed for mild constipation. (Patient taking differently: Take 1 tablet by mouth daily. ) 30 tablet 0  . testosterone cypionate (DEPOTESTOSTERONE CYPIONATE) 200 MG/ML injection Inject 200 mg into the muscle every 14 (fourteen) days.    Marland Kitchen thiamine  100 MG tablet Take 1 tablet (100 mg total) by mouth daily. 30 tablet 1  . traZODone (DESYREL) 100 MG tablet Take 1 tablet (100 mg total) by mouth at bedtime. 30 tablet 1    Musculoskeletal: Strength & Muscle Tone: within normal limits Gait & Station: normal Patient leans: N/A  Psychiatric Specialty Exam: Physical Exam  Nursing note and vitals reviewed. Constitutional: He appears well-developed and well-nourished.  HENT:  Head: Normocephalic and atraumatic.  Eyes: Pupils are equal, round, and reactive to light. Conjunctivae are normal.  Neck: Normal range of motion.  Cardiovascular: Regular rhythm and normal heart sounds.  Respiratory: Effort normal. No respiratory distress.  GI: Soft.  Musculoskeletal: Normal range of motion.  Neurological: He is alert.  Skin: Skin  is warm and dry.  Psychiatric: His speech is normal. Judgment normal. His affect is blunt. He is slowed. Cognition and memory are normal. He expresses no homicidal and no suicidal ideation.    Review of Systems  Constitutional: Negative.   HENT: Negative.   Eyes: Negative.   Respiratory: Negative.   Cardiovascular: Negative.   Gastrointestinal: Negative.   Musculoskeletal: Positive for back pain and myalgias.  Skin: Negative.   Neurological: Negative.   Psychiatric/Behavioral: Positive for substance abuse. Negative for depression, hallucinations, memory loss and suicidal ideas (alcohol). The patient is nervous/anxious (withdrawal effect). The patient does not have insomnia.     Blood pressure (!) 171/100, pulse (!) 104, temperature (!) 97.4 F (36.3 C), temperature source Oral, resp. rate (!) 21, height 5\' 11"  (1.803 m), weight 81.5 kg, SpO2 96 %.Body mass index is 25.06 kg/m.  General Appearance: Casual  Eye Contact:  Good  Speech:  Slow  Volume:  Decreased  Mood:  Dysphoric  Affect:  Congruent  Thought Process:  Goal Directed  Orientation:  Full (Time, Place, and Person)  Thought Content:  Logical   Suicidal Thoughts:  No  Homicidal Thoughts:  No  Memory:  Immediate;   Fair Recent;   Fair Remote;   Fair  Judgement:  Fair  Insight:  Fair  Psychomotor Activity:  Tremor  Concentration:  Concentration: Fair  Recall:  [employer] of Knowledge:  Fair  Language:  Fair  Akathisia:  No  Handed:  Right  AIMS (if indicated):     Assets:  Desire for Improvement Housing Social Support  ADL's:  Intact  Cognition:  Impaired,  Mild  Sleep:   adequate     Treatment Plan Summary: Plan Patient who has history of delirium tremens. He is oriented, and he has enough insight to know that he is drinking himself to death.  Reccomend AA at least every day and to have a sponsor. No medication changes. Patient is welcome back to parent's home.   Disposition: No evidence of imminent risk to self or others at present.   Patient does not meet criteria for psychiatric inpatient admission. Supportive therapy provided about ongoing stressors. Discussed crisis plan, support from social network, calling 911, coming to the Emergency Department, and calling Suicide Hotline. Provided with resources for residential reahabilitation services.  Mariel Craft, MD 06/10/2018 9:56 AM

## 2018-06-10 NOTE — ED Notes (Signed)
Hourly rounding reveals patient sleeping in room. No complaints, stable, in no acute distress. Q15 minute rounds and monitoring via Security to continue. 

## 2018-06-10 NOTE — ED Notes (Addendum)
Patient signed the discharge esignature and placed in the box for medical records

## 2018-06-10 NOTE — ED Triage Notes (Signed)
EMS pt to Rm 20 from home with report of called to residence due to domestic disturbance by  [place] police. Pt had argument with parents. Pt has been drinking heavily today. Hx of same. Pt when asked if SI nods yes. Pt is in soiled clothes.

## 2018-06-10 NOTE — ED Provider Notes (Signed)
The patient has been evaluated at bedside by psychiatry.  Patient is clinically stable.  Not felt to be a danger to self or others.  No SI or Hi.  No indication for inpatient psychiatric admission at this time.  Appropriate for continued outpatient therapy.    Willy Eddy, MD 06/10/18 1120

## 2018-06-10 NOTE — ED Notes (Signed)
TTS attempted to conduct assessment.  Kurt Horton was not able to participate and could not be aroused to participate.

## 2018-06-10 NOTE — ED Notes (Signed)
Patient discharged home to wife, patient received discharge papers. Patient received belongings and verbalized he has received all of his belongings. Patient appropriate and cooperative, Denies SI/HI AVH. Vital signs taken. NAD noted. 

## 2018-06-10 NOTE — ED Provider Notes (Signed)
[facility] Emergency Department Provider Note   ____________________________________________   First MD Initiated Contact with Patient 06/10/18 0030     (approximate)  I have reviewed the triage vital signs and the nursing notes.   HISTORY  Chief Complaint Alcohol Intoxication and Suicidal  Level B caveat: Limited by intoxication  HPI Kurt Horton is a 58 y.o. male brought to the ED from home via EMS with a chief complaint of alcohol intoxication and domestic disturbance.  Patient is a recalcitrant alcoholic who was just seen in the ED approximately 24 hours ago for intoxication.  Reportedly he had an argument with his elderly parents tonight and threatened suicide.  Endorses suicidal ideation without plan.  Voices no medical complaints.    Past Medical History:  Diagnosis Date  . A-fib (HCC)   . Alcohol abuse   . Alcohol withdrawal (HCC) 11/12/2016  . Diabetes mellitus without complication (HCC)   . DJD (degenerative joint disease)   . Hypertension   . Myasthenia gravis (HCC)   . Renal disorder     Patient Active Problem List   Diagnosis Date Noted  . Tobacco use disorder 05/27/2018  . GI bleed 02/04/2018  . Alcohol intoxication (HCC) 12/22/2017  . Malnutrition of moderate degree 10/04/2017  . Chronic combined systolic and diastolic CHF (congestive heart failure) (HCC) 08/13/2017  . Alcohol withdrawal delirium (HCC) 02/26/2017  . Atrial fibrillation with RVR (HCC) 02/25/2017  . Acute alcoholic intoxication with complication (HCC)   . Alcohol use disorder, severe, dependence (HCC)   . Alcohol withdrawal (HCC) 11/13/2016  . Substance induced mood disorder (HCC) 11/13/2016  . SVT (supraventricular tachycardia) (HCC) 11/12/2016  . HTN (hypertension) 11/12/2016  . EtOH dependence (HCC) 11/12/2016  . Lumbar radiculopathy 08/24/2016  . Major depressive disorder, recurrent severe without psychotic features (HCC) 08/24/2016  . Chronic low  back pain 01/04/2016  . Pressure ulcer 12/29/2015  . Osteomyelitis (HCC) 12/26/2015  . PVD (peripheral vascular disease) (HCC) 12/26/2015  . Type II diabetes mellitus with manifestations (HCC) 12/26/2015  . Myasthenia gravis (HCC) 12/26/2015    Past Surgical History:  Procedure Laterality Date  . AMPUTATION TOE Left 12/29/2015   Procedure: AMPUTATION TOE;  Surgeon: Gwyneth RevelsJustin Fowler, DPM;  Location: [facility] ORS;  Service: Podiatry;  Laterality: Left;  . ESOPHAGOGASTRODUODENOSCOPY N/A 02/05/2018   Procedure: ESOPHAGOGASTRODUODENOSCOPY (EGD);  Surgeon: Pasty Spillersahiliani, Varnita B, MD;  Location: [facility] ENDOSCOPY;  Service: Endoscopy;  Laterality: N/A;  . PERIPHERAL VASCULAR CATHETERIZATION N/A 12/27/2015   Procedure: Lower Extremity Angiography;  Surgeon: Annice NeedyJason S Dew, MD;  Location: [facility] INVASIVE CV LAB;  Service: Cardiovascular;  Laterality: N/A;  . right hip surgery    . right shoulder surgery      Prior to Admission medications   Medication Sig Start Date End Date Taking? Authorizing Provider  B Complex-C (B-COMPLEX WITH VITAMIN C) tablet Take 1 tablet by mouth daily. 11/02/17   Alford HighlandWieting, Richard, MD  cloNIDine (CATAPRES - DOSED IN MG/24 HR) 0.1 mg/24hr patch Place 1 patch (0.1 mg total) onto the skin once a week. 05/30/18   Pucilowska, Braulio ConteJolanta B, MD  gabapentin (NEURONTIN) 300 MG capsule Take 2 capsules (600 mg total) by mouth 3 (three) times daily. 05/30/18   Pucilowska, Braulio ConteJolanta B, MD  metFORMIN (GLUCOPHAGE) 500 MG tablet Take 1 tablet (500 mg total) by mouth daily. 05/30/18   Pucilowska, Braulio ConteJolanta B, MD  metoprolol succinate (TOPROL-XL) 50 MG 24 hr tablet Take 1 tablet (50 mg total) by mouth daily. Take with or immediately  following a meal. 05/30/18   Pucilowska, Jolanta B, MD  mirtazapine (REMERON) 15 MG tablet Take 1 tablet (15 mg total) by mouth at bedtime. 05/30/18   Pucilowska, Braulio ConteJolanta B, MD  multivitamin (ONE-A-DAY MEN'S) TABS tablet Take 1 tablet by mouth daily. 04/19/18   Shaune Pollackhen, Qing, MD  mycophenolate  (CELLCEPT) 500 MG tablet Take 1 tablet (500 mg total) by mouth 2 (two) times daily. Take 1 tablet (500MG ) by mouth every morning and 2 tablets (1000MG ) by mouth every evening 05/30/18   Pucilowska, Jolanta B, MD  senna-docusate (SENOKOT-S) 8.6-50 MG tablet Take 1 tablet by mouth at bedtime as needed for mild constipation. Patient taking differently: Take 1 tablet by mouth daily.  12/25/17   Mayo, Allyn KennerKaty Dodd, MD  testosterone cypionate (DEPOTESTOSTERONE CYPIONATE) 200 MG/ML injection Inject 200 mg into the muscle every 14 (fourteen) days.    [provider]  thiamine 100 MG tablet Take 1 tablet (100 mg total) by mouth daily. 05/30/18   Pucilowska, Braulio ConteJolanta B, MD  traZODone (DESYREL) 100 MG tablet Take 1 tablet (100 mg total) by mouth at bedtime. 05/30/18   Pucilowska, Ellin GoodieJolanta B, MD    Allergies Nsaids and Other  Family History  Problem Relation Age of Onset  . Rheum arthritis Father     Social History Social History   Tobacco Use  . Smoking status: Current Every Day Smoker    Packs/day: 0.50    Types: Cigarettes  . Smokeless tobacco: Never Used  Substance Use Topics  . Alcohol use: Yes    Alcohol/week: 20.0 - 30.0 standard drinks    Types: 20 - 30 Shots of liquor per week    Comment: Pt had 20 airplane bottles today  . Drug use: Yes    Types: Oxycodone    Review of Systems  Constitutional: Positive for intoxication.  No fever/chills Eyes: No visual changes. ENT: No sore throat. Cardiovascular: Denies chest pain. Respiratory: Denies shortness of breath. Gastrointestinal: No abdominal pain.  No nausea, no vomiting.  No diarrhea.  No constipation. Genitourinary: Negative for dysuria. Musculoskeletal: Negative for back pain. Skin: Negative for rash. Neurological: Negative for headaches, focal weakness or numbness. Psychiatric:  Positive for SI.  ____________________________________________   PHYSICAL EXAM:  VITAL SIGNS: ED Triage Vitals  Enc Vitals Group     BP  06/10/18 0030 (!) 130/95     Pulse Rate 06/10/18 0030 88     Resp 06/10/18 0030 18     Temp 06/10/18 0043 (!) 97.4 F (36.3 C)     Temp Source 06/10/18 0043 Oral     SpO2 06/10/18 0030 95 %     Weight 06/10/18 0039 179 lb 10.8 oz (81.5 kg)     Height 06/10/18 0039 5\' 11"  (1.803 m)     Head Circumference --      Peak Flow --      Pain Score 06/10/18 0039 0     Pain Loc --      Pain Edu? --      Excl. in GC? --     Constitutional: Asleep, awakened for exam.  Disheveled appearing and in no acute distress. Eyes: Conjunctivae are normal. PERRL. EOMI. Head: Atraumatic. Nose: Atraumatic. Mouth/Throat: Mucous membranes are moist.  No dental malocclusion. Neck: No stridor.  No cervical spine tenderness to palpation. Cardiovascular: Normal rate, regular rhythm. Grossly normal heart sounds.  Good peripheral circulation. Respiratory: Normal respiratory effort.  No retractions. Lungs CTAB. Gastrointestinal: Soft and nontender to light or deep palpation. No distention.  No abdominal bruits. No CVA tenderness. Musculoskeletal: No lower extremity tenderness nor edema.  No joint effusions. Neurologic: Intoxicated.  Normal speech and language. No gross focal neurologic deficits are appreciated. MAEx4. Skin:  Skin is warm, dry and intact. No rash noted. Psychiatric: Mood and affect are flat. Speech and behavior are normal.  ____________________________________________   LABS (all labs ordered are listed, but only abnormal results are displayed)  Labs Reviewed  COMPREHENSIVE METABOLIC PANEL - Abnormal; Notable for the following components:      Result Value   Potassium 3.1 (*)    Glucose, Bld 106 (*)    BUN <5 (*)    Calcium 8.7 (*)    All other components within normal limits  ETHANOL - Abnormal; Notable for the following components:   Alcohol, Ethyl (B) 290 (*)    All other components within normal limits  CBC - Abnormal; Notable for the following components:   Hemoglobin 11.8 (*)     HCT 37.1 (*)    RDW 21.8 (*)    All other components within normal limits  URINE DRUG SCREEN, QUALITATIVE ([facility] ONLY) - Abnormal; Notable for the following components:   Barbiturates, Ur Screen POSITIVE (*)    Benzodiazepine, Ur Scrn POSITIVE (*)    All other components within normal limits  ACETAMINOPHEN LEVEL - Abnormal; Notable for the following components:   Acetaminophen (Tylenol), Serum <10 (*)    All other components within normal limits  SALICYLATE LEVEL   ____________________________________________  EKG  None ____________________________________________  RADIOLOGY  ED MD interpretation: None  Official radiology report(s): No results found.  ____________________________________________   PROCEDURES  Procedure(s) performed: None  Procedures  Critical Care performed: No  ____________________________________________   INITIAL IMPRESSION / ASSESSMENT AND PLAN / ED COURSE  As part of my medical decision making, I reviewed the following data within the electronic MEDICAL RECORD NUMBER Nursing notes reviewed and incorporated, Labs reviewed, Old chart reviewed, A consult was requested and obtained from this/these consultant(s) Psychiatry and Notes from prior ED visits    57 year old alcoholic male brought to the ED from home for intoxication and suicidal ideation without plan.  Patient contracts for safety while in the emergency department.  Will keep patient on voluntary status pending tele-psychiatry evaluation disposition.  IV fluids administered.  Will place on CIWA protocol as patient has had a history of DTs in the past.   Clinical Course as of Jun 10 645  Mon Jun 10, 2018  1610 Patient was too sleepy intoxicated to speak with [facility] psychiatry.  Will ask inpatient psychiatrist to evaluate patient this morning.   [JS]  [id number] Care will be transferred to the oncoming provider.  Disposition per psychiatry.  Patient remains on CIWA protocol as he has a history of DTs in  the past.   [JS]    Clinical Course User Index [JS] Irean Hong, MD     ____________________________________________   FINAL CLINICAL IMPRESSION(S) / ED DIAGNOSES  Final diagnoses:  Alcoholic intoxication without complication (HCC)  Alcohol abuse  Moderate episode of recurrent major depressive disorder (HCC)  Hypokalemia     ED Discharge Orders    None       Note:  This document was prepared using Dragon voice recognition software and may include unintentional dictation errors.    Irean Hong, MD 06/10/18 [phone]

## 2018-06-10 NOTE — Progress Notes (Signed)
Kurt Horton is a 58 y.o. male who presents with alcohol intoxication. Symptom onset has been  for a time period of years. Patient chart reviewed and consulted with Dr. Viviano Simas on patient treatment plan. During evaluation Kurt Horton is alert and orientated x 4: calm and cooperative; and mood congruent with affect. He does not appear to be responding to internal/ external stimuli or delusional thoughts. Patient denies suicidal/ delf-harm/ homicidal ideation, psychosis, and paranoia. Patient answered all questions appropriately.  Patient states lots of his drinking stem from his physical problems. He states that in order to help his pain he drinks and tend to drink more that he should. The patient did admit to inpatient here at [facility] about 3 weeks ago, but he felt he did not think it was helpful and he left on his 5th day of inpatient. The patient stated that he drank 20 of the "air plane" bottles of alcohol and was brought in by an Biomedical scientist.   In collaboration with the patient farther Kurt Horton) he acknowledges that he dose not want him backto his home, but he knows the patient has nowhere else to go. Kurt Horton is in agreement to discharge the patient back home,  he wants him to be referred to out patient services that is able to assist him in recovery. The patient farther stated that it is hard for the patient due to him having lots of physical problems. Therefore; making it difficult for him due to him not being able to work. The patient farther is aware of the situation and he is unsure of what he can do to assist his son.  Disposition: Patient is psychiatrically cleared. No evidence of imminent risk to self or others at present. Patient does not meet criteria for psychiatric inpatient admission. Supportive therapy provided to the patient on some ways to get help with his alcohol use disorder. Patient will be referred to outpatient services for his alcohol used disorder. Dicussed crisis  plan, support from social network and calling suicide Hotline.

## 2018-06-12 ENCOUNTER — Emergency Department
Admission: EM | Admit: 2018-06-12 | Discharge: 2018-06-12 | Disposition: A | Discharge status: Home / Self Care | Payer: Medicaid Other | Attending: Emergency Medicine | Admitting: Emergency Medicine

## 2018-06-12 DIAGNOSIS — Z79899 Other long term (current) drug therapy: Secondary | ICD-10-CM | POA: Diagnosis not present

## 2018-06-12 DIAGNOSIS — I4891 Unspecified atrial fibrillation: Secondary | ICD-10-CM | POA: Insufficient documentation

## 2018-06-12 DIAGNOSIS — F10229 Alcohol dependence with intoxication, unspecified: Secondary | ICD-10-CM | POA: Diagnosis not present

## 2018-06-12 DIAGNOSIS — F17210 Nicotine dependence, cigarettes, uncomplicated: Secondary | ICD-10-CM | POA: Diagnosis not present

## 2018-06-12 DIAGNOSIS — Z7984 Long term (current) use of oral hypoglycemic drugs: Secondary | ICD-10-CM | POA: Insufficient documentation

## 2018-06-12 DIAGNOSIS — I110 Hypertensive heart disease with heart failure: Secondary | ICD-10-CM | POA: Diagnosis not present

## 2018-06-12 DIAGNOSIS — R45851 Suicidal ideations: Secondary | ICD-10-CM | POA: Diagnosis not present

## 2018-06-12 DIAGNOSIS — F10929 Alcohol use, unspecified with intoxication, unspecified: Secondary | ICD-10-CM

## 2018-06-12 DIAGNOSIS — I5042 Chronic combined systolic (congestive) and diastolic (congestive) heart failure: Secondary | ICD-10-CM | POA: Insufficient documentation

## 2018-06-12 DIAGNOSIS — F329 Major depressive disorder, single episode, unspecified: Secondary | ICD-10-CM | POA: Insufficient documentation

## 2018-06-12 DIAGNOSIS — F332 Major depressive disorder, recurrent severe without psychotic features: Secondary | ICD-10-CM | POA: Diagnosis present

## 2018-06-12 DIAGNOSIS — E119 Type 2 diabetes mellitus without complications: Secondary | ICD-10-CM | POA: Diagnosis not present

## 2018-06-12 DIAGNOSIS — F1010 Alcohol abuse, uncomplicated: Secondary | ICD-10-CM

## 2018-06-12 LAB — COMPREHENSIVE METABOLIC PANEL
ALT: 15 U/L (ref 0–44)
AST: 20 U/L (ref 15–41)
Albumin: 3.6 g/dL (ref 3.5–5.0)
Alkaline Phosphatase: 79 U/L (ref 38–126)
Anion gap: 10 (ref 5–15)
BUN: 6 mg/dL (ref 6–20)
CO2: 26 mmol/L (ref 22–32)
Calcium: 8.5 mg/dL — ABNORMAL LOW (ref 8.9–10.3)
Chloride: 107 mmol/L (ref 98–111)
Creatinine, Ser: 0.9 mg/dL (ref 0.61–1.24)
GFR calc Af Amer: >60 mL/min (ref >60)
GFR calc non Af Amer: >60 mL/min (ref >60)
Glucose, Bld: 125 mg/dL — ABNORMAL HIGH (ref 70–99)
Potassium: 3.2 mmol/L — ABNORMAL LOW (ref 3.5–5.1)
Sodium: 143 mmol/L (ref 135–145)
Total Bilirubin: 0.5 mg/dL (ref 0.3–1.2)
Total Protein: 6.4 g/dL — ABNORMAL LOW (ref 6.5–8.1)

## 2018-06-12 LAB — CBC
HCT: 38.9 % — ABNORMAL LOW (ref 39.0–52.0)
Hemoglobin: 12.2 g/dL — ABNORMAL LOW (ref 13.0–17.0)
MCH: 27.2 pg (ref 26.0–34.0)
MCHC: 31.4 g/dL (ref 30.0–36.0)
MCV: 86.6 fL (ref 80.0–100.0)
NRBC: 0 % (ref 0.0–0.2)
Platelets: 296 10*3/uL (ref 150–400)
RBC: 4.49 MIL/uL (ref 4.22–5.81)
RDW: 21.9 % — ABNORMAL HIGH (ref 11.5–15.5)
WBC: 4.7 10*3/uL (ref 4.0–10.5)

## 2018-06-12 LAB — ETHANOL: Alcohol, Ethyl (B): 280 mg/dL — ABNORMAL HIGH (ref <10)

## 2018-06-12 MED ORDER — VITAMIN B-1 100 MG PO TABS
100.0000 mg | ORAL_TABLET | Freq: Every day | ORAL | Status: DC
  Administered 2018-06-12: 100 mg via ORAL
  Filled 2018-06-12: qty 1

## 2018-06-12 MED ORDER — CLONIDINE HCL 0.1 MG/24HR TD PTWK
0.1000 mg | MEDICATED_PATCH | TRANSDERMAL | Status: DC
  Administered 2018-06-12: 0.1 mg via TRANSDERMAL
  Filled 2018-06-12: qty 1

## 2018-06-12 MED ORDER — SENNOSIDES-DOCUSATE SODIUM 8.6-50 MG PO TABS
1.0000 | ORAL_TABLET | Freq: Every day | ORAL | Status: DC
  Administered 2018-06-12: 1 via ORAL
  Filled 2018-06-12: qty 1

## 2018-06-12 MED ORDER — THIAMINE HCL 100 MG/ML IJ SOLN: 100.0000 mg | Freq: Every day | INTRAMUSCULAR | Status: DC

## 2018-06-12 MED ORDER — LORAZEPAM 2 MG PO TABS
0.0000 mg | ORAL_TABLET | Freq: Four times a day (QID) | ORAL | Status: DC
  Administered 2018-06-12: 2 mg via ORAL
  Administered 2018-06-12: 1 mg via ORAL
  Filled 2018-06-12 (×3): qty 1

## 2018-06-12 MED ORDER — LORAZEPAM 2 MG PO TABS: 0.0000 mg | ORAL_TABLET | Freq: Two times a day (BID) | ORAL | Status: DC

## 2018-06-12 MED ORDER — METFORMIN HCL 500 MG PO TABS
500.0000 mg | ORAL_TABLET | Freq: Every day | ORAL | Status: DC
  Administered 2018-06-12: 500 mg via ORAL
  Filled 2018-06-12: qty 1

## 2018-06-12 MED ORDER — VITAMIN B-1 100 MG PO TABS: 100.0000 mg | ORAL_TABLET | Freq: Every day | ORAL | Status: DC

## 2018-06-12 MED ORDER — MYCOPHENOLATE MOFETIL 250 MG PO CAPS
500.0000 mg | ORAL_CAPSULE | Freq: Two times a day (BID) | ORAL | Status: DC
  Administered 2018-06-12: 500 mg via ORAL
  Filled 2018-06-12 (×2): qty 2

## 2018-06-12 MED ORDER — B COMPLEX-C PO TABS
1.0000 | ORAL_TABLET | Freq: Every day | ORAL | Status: DC
  Administered 2018-06-12: 1 via ORAL
  Filled 2018-06-12: qty 1

## 2018-06-12 MED ORDER — GABAPENTIN 300 MG PO CAPS
600.0000 mg | ORAL_CAPSULE | Freq: Three times a day (TID) | ORAL | Status: DC
  Administered 2018-06-12 (×2): 600 mg via ORAL
  Filled 2018-06-12 (×2): qty 2

## 2018-06-12 MED ORDER — TRAZODONE HCL 100 MG PO TABS: 100.0000 mg | ORAL_TABLET | Freq: Every day | ORAL | Status: DC

## 2018-06-12 MED ORDER — ADULT MULTIVITAMIN W/MINERALS CH
1.0000 | ORAL_TABLET | Freq: Every day | ORAL | Status: DC
  Administered 2018-06-12: 1 via ORAL
  Filled 2018-06-12: qty 1

## 2018-06-12 MED ORDER — LORAZEPAM 2 MG/ML IJ SOLN: 0.0000 mg | Freq: Two times a day (BID) | INTRAMUSCULAR | Status: DC

## 2018-06-12 MED ORDER — METOPROLOL SUCCINATE ER 50 MG PO TB24
50.0000 mg | ORAL_TABLET | Freq: Every day | ORAL | Status: DC
  Administered 2018-06-12: 50 mg via ORAL
  Filled 2018-06-12: qty 1

## 2018-06-12 MED ORDER — LORAZEPAM 2 MG/ML IJ SOLN: 0.0000 mg | Freq: Four times a day (QID) | INTRAMUSCULAR | Status: DC

## 2018-06-12 NOTE — ED Notes (Signed)
Pt ambulated to the bathroom at this time by Council [employer]ic- EDT-P.

## 2018-06-12 NOTE — Consult Note (Signed)
[facility] Face-to-Face Psychiatry Consult   Reason for Consult: Follow-up consult for this patient with alcohol abuse and delirium tremens Referring Physician: Dr. Manson Passey Patient Identification: Kurt Horton MRN:  [id number] Principal Diagnosis: <principal problem not specified> Diagnosis:  Active Problems:   * No active hospital problems. *   Total Time spent with patient: 45 minutes  Subjective: "I am back here again, I do not know what to do." HPI:   Kurt Horton is a 58 y.o. male patient with below list of chronic medical conditions including alcohol abuse urgency department via EMS requesting alcohol detox.  Patient also states "I wish I did not exist".  Patient states that he has been having suicidal ideation.  Patient denies any homicidal ideation.  Patient states he drank approximately 20 small bottles of alcohol tonight.  Patient is seen and chart is reviewed.  Of note patient was seen with similar problem 2 days ago and discharged into the care of his parents to seek outpatient rehabilitative services.  Patient has multiple emergency room visits with similar complaints.  He had an inpatient psychiatric admission for detox and suicidal ideation from May 25, 2021 May 30, 2018.  Patient reports his longest period of sobriety is 5 days.  Blood alcohol level on arrival today was 280, 2 days ago 290, 3 days ago 315 and 2 weeks ago 263.  During evaluation Mr. Grewell is alert and orientated x 4: calm and cooperative; and mood congruent with affect. He does not appear to be responding to internal/ external stimuli or delusional thoughts. Patient denies suicidal/ self-harm/ homicidal ideation, psychosis, and paranoia. Patient answered all questions appropriately.  He denies SI, HI, AVH.  Per record review from May 10, 2018: Collateral obtained: Patient farther Megan Salon) he acknowledges that he dose not want him back to his home, but he knows the patient has nowhere else  to go. Mr. Epic Tribbett is in agreement to discharge the patient back home,  he wants him to be referred to out patient services that is able to assist him in recovery. The patient farther stated that it is hard for the patient due to him having lots of physical problems. Therefore; making it difficult for him due to him not being able to work. The patient farther is aware of the situation and he is unsure of what he can do to assist his son.  Past Psychiatric History: Depression but mainly a long history of alcohol abuse without any recent ability to stay sober meanwhile with his physical compromise getting worse  Risk to Self:  No Risk to Others:  No Prior Inpatient Therapy:  Yes Prior Outpatient Therapy:  Yes, last at [facility] January 18 through May 30, 2018  Past Medical History:  Past Medical History:  Diagnosis Date  . A-fib (HCC)   . Alcohol abuse   . Alcohol withdrawal (HCC) 11/12/2016  . Diabetes mellitus without complication (HCC)   . DJD (degenerative joint disease)   . Hypertension   . Myasthenia gravis (HCC)   . Renal disorder     Past Surgical History:  Procedure Laterality Date  . AMPUTATION TOE Left 12/29/2015   Procedure: AMPUTATION TOE;  Surgeon: Gwyneth Revels, DPM;  Location: [facility] ORS;  Service: Podiatry;  Laterality: Left;  . ESOPHAGOGASTRODUODENOSCOPY N/A 02/05/2018   Procedure: ESOPHAGOGASTRODUODENOSCOPY (EGD);  Surgeon: Pasty Spillers, MD;  Location: [facility] ENDOSCOPY;  Service: Endoscopy;  Laterality: N/A;  . PERIPHERAL VASCULAR CATHETERIZATION N/A 12/27/2015   Procedure:  Lower Extremity Angiography;  Surgeon: Annice Needy, MD;  Location: [facility] INVASIVE CV LAB;  Service: Cardiovascular;  Laterality: N/A;  . right hip surgery    . right shoulder surgery     Family History:  Family History  Problem Relation Age of Onset  . Rheum arthritis Father    Family Psychiatric  History: See previous notes Social History:  Social History    Substance and Sexual Activity  Alcohol Use Yes  . Alcohol/week: 20.0 - 30.0 standard drinks  . Types: 20 - 30 Shots of liquor per week   Comment: Pt had 20 airplane bottles today     Social History   Substance and Sexual Activity  Drug Use Yes  . Types: Oxycodone    Social History   Socioeconomic History  . Marital status: Divorced    Spouse name: Not on file  . Number of children: Not on file  . Years of education: Not on file  . Highest education level: Not on file  Occupational History  . Not on file  Social Needs  . Financial resource strain: Not on file  . Food insecurity:    Worry: Not on file    Inability: Not on file  . Transportation needs:    Medical: Not on file    Non-medical: Not on file  Tobacco Use  . Smoking status: Current Every Day Smoker    Packs/day: 0.50    Types: Cigarettes  . Smokeless tobacco: Never Used  Substance and Sexual Activity  . Alcohol use: Yes    Alcohol/week: 20.0 - 30.0 standard drinks    Types: 20 - 30 Shots of liquor per week    Comment: Pt had 20 airplane bottles today  . Drug use: Yes    Types: Oxycodone  . Sexual activity: Not on file  Lifestyle  . Physical activity:    Days per week: Not on file    Minutes per session: Not on file  . Stress: Not on file  Relationships  . Social connections:    Talks on phone: Not on file    Gets together: Not on file    Attends religious service: Not on file    Active member of club or organization: Not on file    Attends meetings of clubs or organizations: Not on file    Relationship status: Not on file  Other Topics Concern  . Not on file  Social History Narrative   Live with Parents, ambulates with a cane. Falls lately.   Additional Social History: Lives with parents: Father and mother are 83 years old, father has rheumatoid arthritis and mother has cardiac condition. Currently unemployed, on disability Patient reports that he does save most of his disability check, but  spends at least $500 of each on alcohol.  He states that his parents have discussed taking his money from him, but have not done so to this point. Patient has 2 siblings with whom he has little contact with.    Allergies:   Allergies  Allergen Reactions  . Nsaids Other (See Comments)    Duodenal ulcers, GI bleeding  . Other Other (See Comments)    Patient states he can't take any "mycin" medications because myasthenia gravis.    Labs:  Results for orders placed or performed during the hospital encounter of 06/12/18 (from the past 48 hour(s))  CBC     Status: Abnormal   Collection Time: 06/12/18  1:05 AM  Result Value Ref Range  WBC 4.7 4.0 - 10.5 K/uL   RBC 4.49 4.22 - 5.81 MIL/uL   Hemoglobin 12.2 (L) 13.0 - 17.0 g/dL   HCT 16.1 (L) 09.6 - 04.5 %   MCV 86.6 80.0 - 100.0 fL   MCH 27.2 26.0 - 34.0 pg   MCHC 31.4 30.0 - 36.0 g/dL   RDW 40.9 (H) 81.1 - 91.4 %   Platelets 296 150 - 400 K/uL   nRBC 0.0 0.0 - 0.2 %    Comment: Performed at [facility], [address] [place], [place] [address]  Comprehensive metabolic panel     Status: Abnormal   Collection Time: 06/12/18  1:05 AM  Result Value Ref Range   Sodium 143 135 - 145 mmol/L   Potassium 3.2 (L) 3.5 - 5.1 mmol/L   Chloride 107 98 - 111 mmol/L   CO2 26 22 - 32 mmol/L   Glucose, Bld 125 (H) 70 - 99 mg/dL   BUN 6 6 - 20 mg/dL   Creatinine, Ser 6.21 0.61 - 1.24 mg/dL   Calcium 8.5 (L) 8.9 - 10.3 mg/dL   Total Protein 6.4 (L) 6.5 - 8.1 g/dL   Albumin 3.6 3.5 - 5.0 g/dL   AST 20 15 - 41 U/L   ALT 15 0 - 44 U/L   Alkaline Phosphatase 79 38 - 126 U/L   Total Bilirubin 0.5 0.3 - 1.2 mg/dL   GFR calc non Af Amer >60 >60 mL/min   GFR calc Af Amer >60 >60 mL/min   Anion gap 10 5 - 15    Comment: Performed at [facility], [address] [place], [place] [address]  Ethanol     Status: Abnormal   Collection Time: 06/12/18  1:05 AM  Result Value Ref Range   Alcohol, Ethyl (B) 280 (H) <10 mg/dL     Comment: (NOTE) Lowest detectable limit for serum alcohol is 10 mg/dL. For medical purposes only. Performed at [facility], [address], [place], [place] [address]     Current Facility-Administered Medications  Medication Dose Route Frequency Provider Last Rate Last Dose  . LORazepam (ATIVAN) injection 0-4 mg  0-4 mg Intravenous Q6H Darci Current, MD       Or  . LORazepam (ATIVAN) tablet 0-4 mg  0-4 mg Oral Q6H Darci Current, MD   Stopped at 06/12/18 [phone]  . [START ON 06/14/2018] LORazepam (ATIVAN) injection 0-4 mg  0-4 mg Intravenous Q12H Darci Current, MD       Or  . Melene Muller ON 06/14/2018] LORazepam (ATIVAN) tablet 0-4 mg  0-4 mg Oral Q12H Darci Current, MD      . thiamine (VITAMIN B-1) tablet 100 mg  100 mg Oral Daily Darci Current, MD       Or  . thiamine (B-1) injection 100 mg  100 mg Intravenous Daily Darci Current, MD       Current Outpatient Medications  Medication Sig Dispense Refill  . B Complex-C (B-COMPLEX WITH VITAMIN C) tablet Take 1 tablet by mouth daily. 30 tablet 0  . gabapentin (NEURONTIN) 300 MG capsule Take 2 capsules (600 mg total) by mouth 3 (three) times daily. 180 capsule 0  . metFORMIN (GLUCOPHAGE) 500 MG tablet Take 1 tablet (500 mg total) by mouth daily. 30 tablet 1  . metoprolol succinate (TOPROL-XL) 50 MG 24 hr tablet Take 1 tablet (50 mg total) by mouth daily. Take with or immediately following a meal. 30 tablet 1  .  multivitamin (ONE-A-DAY MEN'S) TABS tablet Take 1 tablet by mouth daily. 30 tablet 1  . mycophenolate (CELLCEPT) 500 MG tablet Take 1 tablet (500 mg total) by mouth 2 (two) times daily. Take 1 tablet (500MG ) by mouth every morning and 2 tablets (1000MG ) by mouth every evening 90 tablet 1  . senna-docusate (SENOKOT-S) 8.6-50 MG tablet Take 1 tablet by mouth at bedtime as needed for mild constipation. (Patient taking differently: Take 1 tablet by mouth daily. ) 30 tablet 0  . testosterone cypionate  (DEPOTESTOSTERONE CYPIONATE) 200 MG/ML injection Inject 200 mg into the muscle every 14 (fourteen) days.    Marland Kitchen. thiamine 100 MG tablet Take 1 tablet (100 mg total) by mouth daily. 30 tablet 1  . cloNIDine (CATAPRES - DOSED IN MG/24 HR) 0.1 mg/24hr patch Place 1 patch (0.1 mg total) onto the skin once a week. (Patient not taking: Reported on 06/12/2018) 4 patch 12  . mirtazapine (REMERON) 15 MG tablet Take 1 tablet (15 mg total) by mouth at bedtime. (Patient not taking: Reported on 06/12/2018) 30 tablet 1  . traZODone (DESYREL) 100 MG tablet Take 1 tablet (100 mg total) by mouth at bedtime. 30 tablet 1    Musculoskeletal: Strength & Muscle Tone: within normal limits Gait & Station: normal Patient leans: N/A  Psychiatric Specialty Exam: Physical Exam  Nursing note and vitals reviewed. Constitutional: He appears well-developed and well-nourished.  HENT:  Head: Normocephalic and atraumatic.  Eyes: Pupils are equal, round, and reactive to light. Conjunctivae are normal.  Neck: Normal range of motion.  Cardiovascular: Regular rhythm and normal heart sounds.  Respiratory: Effort normal. No respiratory distress.  GI: Soft.  Musculoskeletal: Normal range of motion.  Neurological: He is alert.  Skin: Skin is warm and dry.  Psychiatric: His speech is normal. Judgment normal. His affect is blunt. He is slowed. Cognition and memory are normal. He expresses no homicidal and no suicidal ideation.    Review of Systems  Constitutional: Negative.   HENT: Negative.   Eyes: Negative.   Respiratory: Negative.   Cardiovascular: Negative.   Gastrointestinal: Negative.   Musculoskeletal: Positive for back pain and myalgias.  Skin: Negative.   Neurological: Negative.   Psychiatric/Behavioral: Positive for substance abuse. Negative for depression, hallucinations, memory loss and suicidal ideas (alcohol). The patient is nervous/anxious (withdrawal effect). The patient does not have insomnia.     Blood  pressure (!) 156/106, pulse (!) 121, temperature 98.4 F (36.9 C), temperature source Oral, resp. rate (!) 22, SpO2 96 %.There is no height or weight on file to calculate BMI.  General Appearance: Casual  Eye Contact:  Good  Speech:  Slow  Volume:  Decreased  Mood:  Dysphoric  Affect:  Congruent  Thought Process:  Goal Directed  Orientation:  Full (Time, Place, and Person)  Thought Content:  Logical  Suicidal Thoughts:  No  Homicidal Thoughts:  No  Memory:  Immediate;   Fair Recent;   Fair Remote;   Fair  Judgement:  Fair  Insight:  Fair  Psychomotor Activity:  Tremor  Concentration:  Concentration: Fair  Recall:  FiservFair  Fund of Knowledge:  Fair  Language:  Fair  Akathisia:  No  Handed:  Right  AIMS (if indicated):     Assets:  Desire for Improvement Housing Social Support  ADL's:  Intact  Cognition:  Impaired,  Mild  Sleep:   adequate     Treatment Plan Summary: Plan Patient who has history of delirium tremens. He is oriented, and he  has enough insight to know that he is drinking himself to death.  Reccomend patient seek residential treatment, AA at least every day and to have a sponsor. No medication changes. Patient is welcome back to parent's home. Patient reports  Disposition: No evidence of imminent risk to self or others at present.   Patient does not meet criteria for psychiatric inpatient admission. Supportive therapy provided about ongoing stressors. Discussed crisis plan, support from social network, calling 911, coming to the Emergency Department, and calling Suicide Hotline. Provided with resources for residential reahabilitation services. Discharge patient when medically stable and at decreased risk of withdrawal seizures/delirium tremens.  Mariel CraftSHEILA M Keyla Milone, MD 06/12/2018 9:09 AM

## 2018-06-12 NOTE — ED Notes (Signed)
TTS gave outpatient resources to patient.

## 2018-06-12 NOTE — BH Assessment (Signed)
Assessment Note  Kurt Horton is an 58 y.o. male who presents to the ER seeking assistance for his alcohol use. Patient was recently seen in the ER for similar presentation. Patient states when he returned home with his parents, he started drinking again. At one point he voiced thoughts of not wanting to live but denies SI with this Clinical research associate.  During the interview, the patient was calm, cooperative and pleasant. He was able to provide appropriate answers to the questions. He denies SI/HI and AV/H.  Diagnosis: Alcohol Use Disorder  Past Medical History:  Past Medical History:  Diagnosis Date  . A-fib (HCC)   . Alcohol abuse   . Alcohol withdrawal (HCC) 11/12/2016  . Diabetes mellitus without complication (HCC)   . DJD (degenerative joint disease)   . Hypertension   . Myasthenia gravis (HCC)   . Renal disorder     Past Surgical History:  Procedure Laterality Date  . AMPUTATION TOE Left 12/29/2015   Procedure: AMPUTATION TOE;  Surgeon: Gwyneth Revels, DPM;  Location: [facility] ORS;  Service: Podiatry;  Laterality: Left;  . ESOPHAGOGASTRODUODENOSCOPY N/A 02/05/2018   Procedure: ESOPHAGOGASTRODUODENOSCOPY (EGD);  Surgeon: Pasty Spillers, MD;  Location: [facility] ENDOSCOPY;  Service: Endoscopy;  Laterality: N/A;  . PERIPHERAL VASCULAR CATHETERIZATION N/A 12/27/2015   Procedure: Lower Extremity Angiography;  Surgeon: Annice Needy, MD;  Location: [facility] INVASIVE CV LAB;  Service: Cardiovascular;  Laterality: N/A;  . right hip surgery    . right shoulder surgery      Family History:  Family History  Problem Relation Age of Onset  . Rheum arthritis Father     Social History:  reports that he has been smoking cigarettes. He has been smoking about 0.50 packs per day. He has never used smokeless tobacco. He reports current alcohol use of about 20.0 - 30.0 standard drinks of alcohol per week. He reports current drug use. Drug: Oxycodone.  Additional Social History:  Alcohol / Drug Use Pain  Medications: SEE PTA  Prescriptions: SEE PTA  Over the Counter: SEE PTA  History of alcohol / drug use?: Yes Longest period of sobriety (when/how long): Unable to quantify Negative Consequences of Use: Financial, Personal relationships, Work / School Substance #1 Name of Substance 1: Alcohol  1 - Last Use / Amount: 06/11/2018  CIWA: CIWA-Ar BP: (!) 156/106 Pulse Rate: (!) 121 Nausea and Vomiting: mild nausea with no vomiting Tactile Disturbances: none Tremor: severe, even with arms not extended Auditory Disturbances: not present Paroxysmal Sweats: barely perceptible sweating, palms moist Visual Disturbances: not present Anxiety: mildly anxious Headache, Fullness in Head: mild Agitation: somewhat more than normal activity Orientation and Clouding of Sensorium: oriented and can do serial additions CIWA-Ar Total: 13 COWS:    Allergies:  Allergies  Allergen Reactions  . Nsaids Other (See Comments)    Duodenal ulcers, GI bleeding  . Other Other (See Comments)    Patient states he can't take any "mycin" medications because myasthenia gravis.    Home Medications: (Not in a hospital admission)   OB/GYN Status:  No LMP for male patient.  General Assessment Data Location of Assessment: [facility] ED TTS Assessment: In system Is this a Tele or Face-to-Face Assessment?: Face-to-Face Is this an Initial Assessment or a Re-assessment for this encounter?: Initial Assessment Patient Accompanied by:: N/A Language Other than English: No Living Arrangements: Other (Comment)(Private Home) What gender do you identify as?: Male Marital status: Divorced Jordan name: n/a Pregnancy Status: No Can pt return to current living arrangement?: Yes  Admission Status: Voluntary Petitioner: Other Is patient capable of signing voluntary admission?: Yes Referral Source: Self/Family/Friend Insurance type: None  Medical Screening Exam [facility] Walk-in ONLY) Medical Exam completed: Yes  Crisis Care  Plan Legal Guardian: Other: Name of Psychiatrist: Reports of none Name of Therapist: Reports of none  Education Status Is patient currently in school?: No Highest grade of school patient has completed: MBA Is the patient employed, unemployed or receiving disability?: Unemployed, Receiving disability income  Risk to self with the past 6 months Suicidal Ideation: No Has patient been a risk to self within the past 6 months prior to admission? : No Suicidal Intent: No Has patient had any suicidal intent within the past 6 months prior to admission? : No Is patient at risk for suicide?: No Suicidal Plan?: No Has patient had any suicidal plan within the past 6 months prior to admission? : No Specify Current Suicidal Plan: Reports of none Access to Means: No What has been your use of drugs/alcohol within the last 12 months?: Alcohol Previous Attempts/Gestures: No How many times?: 0 Other Self Harm Risks: Active use of alcohol Triggers for Past Attempts: None known Intentional Self Injurious Behavior: None Family Suicide History: No Recent stressful life event(s): Other (Comment) Persecutory voices/beliefs?: No Depression: Yes Depression Symptoms: Feeling worthless/self pity, Isolating Substance abuse history and/or treatment for substance abuse?: Yes Suicide prevention information given to non-admitted patients: Not applicable  Risk to Others within the past 6 months Homicidal Ideation: No Does patient have any lifetime risk of violence toward others beyond the six months prior to admission? : No Thoughts of Harm to Others: No Current Homicidal Intent: No Current Homicidal Plan: No Access to Homicidal Means: No Identified Victim: Reports of none History of harm to others?: No Assessment of Violence: None Noted Violent Behavior Description: Reports of none Does patient have access to weapons?: No Criminal Charges Pending?: No Does patient have a court date: No Is patient on  probation?: No  Psychosis Hallucinations: None noted Delusions: None noted  Mental Status Report Appearance/Hygiene: Unremarkable, In scrubs Eye Contact: Fair Motor Activity: Freedom of movement, Unremarkable Speech: Logical/coherent, Unremarkable Level of Consciousness: Alert Mood: Sad, Anxious, Pleasant Affect: Appropriate to circumstance, Depressed Anxiety Level: Minimal Thought Processes: Coherent, Relevant Judgement: Unimpaired Orientation: Person, Place, Time, Situation, Appropriate for developmental age Obsessive Compulsive Thoughts/Behaviors: Minimal  Cognitive Functioning Concentration: Normal Memory: Recent Intact, Remote Intact Is patient IDD: No Insight: Fair Impulse Control: Fair Appetite: Fair Have you had any weight changes? : No Change Sleep: No Change Total Hours of Sleep: 8 Vegetative Symptoms: None  ADLScreening [facility] Assessment Services) Patient's cognitive ability adequate to safely complete daily activities?: Yes Patient able to express need for assistance with ADLs?: Yes Independently performs ADLs?: Yes (appropriate for developmental age)  Prior Inpatient Therapy Prior Inpatient Therapy: Yes Prior Therapy Dates: Unable to remember dates Prior Therapy Facilty/Provider(s): 3-Rehab & 1-Detox Facility Reason for Treatment: Alcohol Treatment  Prior Outpatient Therapy Prior Outpatient Therapy: No Does patient have an ACCT team?: No Does patient have Intensive In-House Services?  : No Does patient have [facility] services? : No Does patient have P4CC services?: No  ADL Screening (condition at time of admission) Patient's cognitive ability adequate to safely complete daily activities?: Yes Is the patient deaf or have difficulty hearing?: No Does the patient have difficulty seeing, even when wearing glasses/contacts?: No Does the patient have difficulty concentrating, remembering, or making decisions?: No Patient able to express need for assistance  with ADLs?: Yes Does  the patient have difficulty dressing or bathing?: No Independently performs ADLs?: Yes (appropriate for developmental age) Does the patient have difficulty walking or climbing stairs?: No Weakness of Legs: None Weakness of Arms/Hands: None  Home Assistive Devices/Equipment Home Assistive Devices/Equipment: None  Therapy Consults (therapy consults require a physician order) PT Evaluation Needed: No OT Evalulation Needed: No SLP Evaluation Needed: No Abuse/Neglect Assessment (Assessment to be complete while patient is alone) Abuse/Neglect Assessment Can Be Completed: Yes Physical Abuse: Denies Verbal Abuse: Denies Sexual Abuse: Denies Exploitation of patient/patient's resources: Denies Self-Neglect: Denies Values / Beliefs Cultural Requests During Hospitalization: None Spiritual Requests During Hospitalization: None Consults Spiritual Care Consult Needed: No Social Work Consult Needed: No   Nutrition Screen- [facility] Adult/WL/AP Patient's home diet: Regular     Child/Adolescent Assessment Running Away Risk: Denies(Reports of none)  Disposition:  Disposition Initial Assessment Completed for this Encounter: Yes  On Site Evaluation by:   Reviewed with Physician:    Lilyan Gilfordalvin J. Orva Riles MS, LCAS, LPC, NCC, CCSI Therapeutic Triage Specialist 06/12/2018 12:37 PM

## 2018-06-12 NOTE — ED Notes (Signed)
Psychiatrist at bedside at this time.  

## 2018-06-12 NOTE — BH Assessment (Signed)
Writer provided patient with residential Treatment facilities to address his alcohol use.   Residential Treatment Facilities  [facility] [address],  [place], [place] [address] [phone]  [facility] [address], [place] [phone]  [facility] [address],  [place], [place] [phone]   [facility] [address],  [place], [place] [address] [phone]

## 2018-06-12 NOTE — ED Notes (Signed)
Pt given crackers and peanut butter per his request.

## 2018-06-12 NOTE — ED Notes (Signed)
Hourly rounding reveals patient sleeping in room. No complaints, stable, in no acute distress. Q15 minute rounds and monitoring via Rover and Officer to continue.  

## 2018-06-12 NOTE — ED Notes (Signed)
Pt. Requesting phone to call for ride.  Pt. Given phone to use.

## 2018-06-12 NOTE — ED Notes (Signed)
Pt provided with meal tray at this time. Pt remains resting in bed, meal tray placed at bedside for when patient awakens.

## 2018-06-12 NOTE — ED Notes (Signed)
Pt c/o pain to back and shoulders, requesting "something stronger for pain". Pt resting in bed with NAD noted at this time.

## 2018-06-12 NOTE — ED Notes (Signed)
Pt ambulated back to bed with staff as standby assist. Will continue to monitor for further patient needs.

## 2018-06-12 NOTE — ED Notes (Signed)
Pt to room 20 via EMS.  Pt ambulatory and very cooperative.  Labs drawn and sent to lab.  Pt dressed out in burgundy scrubs.  Pts belongings, which include, black coat, blue sweatshirt, jeans, belt, underwear and shoes.  Pt had 1 yellow colored ring and one white colored ring, and a white colored watch that was placed in his black coat pocket and zippered.  Pts belongings were labelled and placed at the nurses station.  Pt provided gingerale, warm blanket, and pt went directly to sleep.

## 2018-06-12 NOTE — ED Triage Notes (Signed)
Patient here requesting ETOH Detox and also states "I wish I didn't exist".

## 2018-06-12 NOTE — ED Notes (Signed)
Taxi cab called for patient.  Patient waiting in lobby for cab

## 2018-06-12 NOTE — ED Notes (Signed)
Pt ambulated to the bathroom by this RN.

## 2018-06-12 NOTE — ED Provider Notes (Signed)
[facility] Emergency Department Provider Note ____   First MD Initiated Contact with Patient 06/12/18 [phone]     (approximate)  I have reviewed the triage vital signs and the nursing notes.   HISTORY  Chief Complaint Suicidal (Patient requesting ETOH Detox)    HPI Kurt Horton is a 58 y.o. male with below list of chronic medical conditions including alcohol abuse urgency department via EMS requesting alcohol detox.  Patient also states "I wish I did not exist".  Patient states that he has been having suicidal ideation.  Patient denies any homicidal ideation.  Patient states he drank approximately 20 small bottles of alcohol tonight.   Past Medical History:  Diagnosis Date  . A-fib (HCC)   . Alcohol abuse   . Alcohol withdrawal (HCC) 11/12/2016  . Diabetes mellitus without complication (HCC)   . DJD (degenerative joint disease)   . Hypertension   . Myasthenia gravis (HCC)   . Renal disorder     Patient Active Problem List   Diagnosis Date Noted  . Tobacco use disorder 05/27/2018  . GI bleed 02/04/2018  . Alcohol intoxication (HCC) 12/22/2017  . Malnutrition of moderate degree 12-09-2017  . Chronic combined systolic and diastolic CHF (congestive heart failure) (HCC) 08/13/2017  . Alcohol withdrawal delirium (HCC) 02/26/2017  . Atrial fibrillation with RVR (HCC) 02/25/2017  . Acute alcoholic intoxication with complication (HCC)   . Alcohol use disorder, severe, dependence (HCC)   . Substance induced mood disorder (HCC) 11/13/2016  . SVT (supraventricular tachycardia) (HCC) 11/12/2016  . HTN (hypertension) 11/12/2016  . EtOH dependence (HCC) 11/12/2016  . Lumbar radiculopathy 08/24/2016  . Major depressive disorder, recurrent severe without psychotic features (HCC) 08/24/2016  . Chronic low back pain 01/04/2016  . Pressure ulcer 12/29/2015  . Osteomyelitis (HCC) 12/26/2015  . PVD (peripheral vascular disease) (HCC) 12/26/2015  . Type II  diabetes mellitus with manifestations (HCC) 12/26/2015  . Myasthenia gravis (HCC) 12/26/2015    Past Surgical History:  Procedure Laterality Date  . AMPUTATION TOE Left 12/29/2015   Procedure: AMPUTATION TOE;  Surgeon: Gwyneth Revels, DPM;  Location: [facility] ORS;  Service: Podiatry;  Laterality: Left;  . ESOPHAGOGASTRODUODENOSCOPY N/A 02/05/2018   Procedure: ESOPHAGOGASTRODUODENOSCOPY (EGD);  Surgeon: Pasty Spillers, MD;  Location: [facility] ENDOSCOPY;  Service: Endoscopy;  Laterality: N/A;  . PERIPHERAL VASCULAR CATHETERIZATION N/A 12/27/2015   Procedure: Lower Extremity Angiography;  Surgeon: Annice Needy, MD;  Location: [facility] INVASIVE CV LAB;  Service: Cardiovascular;  Laterality: N/A;  . right hip surgery    . right shoulder surgery      Prior to Admission medications   Medication Sig Start Date End Date Taking? Authorizing Provider  B Complex-C (B-COMPLEX WITH VITAMIN C) tablet Take 1 tablet by mouth daily. 11/02/17  Yes Wieting, Richard, MD  gabapentin (NEURONTIN) 300 MG capsule Take 2 capsules (600 mg total) by mouth 3 (three) times daily. 05/30/18  Yes Pucilowska, Jolanta B, MD  metFORMIN (GLUCOPHAGE) 500 MG tablet Take 1 tablet (500 mg total) by mouth daily. 05/30/18  Yes Pucilowska, Jolanta B, MD  metoprolol succinate (TOPROL-XL) 50 MG 24 hr tablet Take 1 tablet (50 mg total) by mouth daily. Take with or immediately following a meal. 05/30/18  Yes Pucilowska, Jolanta B, MD  multivitamin (ONE-A-DAY MEN'S) TABS tablet Take 1 tablet by mouth daily. 04/19/18  Yes Shaune Pollack, MD  mycophenolate (CELLCEPT) 500 MG tablet Take 1 tablet (500 mg total) by mouth 2 (two) times daily. Take 1 tablet (500MG )  by mouth every morning and 2 tablets (1000MG ) by mouth every evening 05/30/18  Yes Pucilowska, Jolanta B, MD  senna-docusate (SENOKOT-S) 8.6-50 MG tablet Take 1 tablet by mouth at bedtime as needed for mild constipation. Patient taking differently: Take 1 tablet by mouth daily.  12/25/17  Yes Mayo, Allyn KennerKaty  Dodd, MD  testosterone cypionate (DEPOTESTOSTERONE CYPIONATE) 200 MG/ML injection Inject 200 mg into the muscle every 14 (fourteen) days.   Yes [provider]  thiamine 100 MG tablet Take 1 tablet (100 mg total) by mouth daily. 05/30/18  Yes Pucilowska, Jolanta B, MD  cloNIDine (CATAPRES - DOSED IN MG/24 HR) 0.1 mg/24hr patch Place 1 patch (0.1 mg total) onto the skin once a week. Patient not taking: Reported on 06/12/2018 05/30/18   Pucilowska, Braulio ConteJolanta B, MD  mirtazapine (REMERON) 15 MG tablet Take 1 tablet (15 mg total) by mouth at bedtime. Patient not taking: Reported on 06/12/2018 05/30/18   Pucilowska, Ellin GoodieJolanta B, MD  traZODone (DESYREL) 100 MG tablet Take 1 tablet (100 mg total) by mouth at bedtime. 05/30/18   Pucilowska, Ellin GoodieJolanta B, MD    Allergies Nsaids and Other  Family History  Problem Relation Age of Onset  . Rheum arthritis Father     Social History Social History   Tobacco Use  . Smoking status: Current Every Day Smoker    Packs/day: 0.50    Types: Cigarettes  . Smokeless tobacco: Never Used  Substance Use Topics  . Alcohol use: Yes    Alcohol/week: 20.0 - 30.0 standard drinks    Types: 20 - 30 Shots of liquor per week    Comment: Pt had 20 airplane bottles today  . Drug use: Yes    Types: Oxycodone    Review of Systems Constitutional: No fever/chills Eyes: No visual changes. ENT: No sore throat. Cardiovascular: Denies chest pain. Respiratory: Denies shortness of breath. Gastrointestinal: No abdominal pain.  No nausea, no vomiting.  No diarrhea.  No constipation. Genitourinary: Negative for dysuria. Musculoskeletal: Negative for neck pain.  Negative for back pain. Integumentary: Negative for rash. Neurological: Negative for headaches, focal weakness or numbness. Psychiatric:Positive for alcohol abuse and suicidal ideation  ____________________________________________   PHYSICAL EXAM:  VITAL SIGNS: ED Triage Vitals [06/12/18 0134]  Enc Vitals Group      BP (!) 150/94     Pulse Rate (!) 116     Resp 16     Temp 98 F (36.7 C)     Temp Source Oral     SpO2 100 %     Weight      Height      Head Circumference      Peak Flow      Pain Score 0     Pain Loc      Pain Edu?      Excl. in GC?     Constitutional: Alert and oriented.  Appears intoxicated.   Eyes: Conjunctivae are normal.  Head: Atraumatic. Mouth/Throat: Mucous membranes are moist.  Oropharynx non-erythematous. Neck: No stridor.   Cardiovascular: Normal rate, regular rhythm. Good peripheral circulation. Grossly normal heart sounds. Respiratory: Normal respiratory effort.  No retractions. Lungs CTAB. Gastrointestinal: Soft and nontender. No distention.  Musculoskeletal: No lower extremity tenderness nor edema. No gross deformities of extremities. Neurologic:  Normal speech and language. No gross focal neurologic deficits are appreciated.  Skin:  Skin is warm, dry and intact. No rash noted. Psychiatric: Appears intoxicated.    Labs Reviewed - No data to display ____________________________________________  Procedures   ____________________________________________   INITIAL IMPRESSION / ASSESSMENT AND PLAN / ED COURSE  As part of my medical decision making, I reviewed the following data within the electronic MEDICAL RECORD NUMBER  58 year old male presenting with above-stated history and physical exam consistent with acute alcohol intoxication requesting alcohol detox.  Patient also voiced suicidal ideation.  Awaiting TTS and psychiatry consultation at this time. ____________________________________________  FINAL CLINICAL IMPRESSION(S) / ED DIAGNOSES  Final diagnoses:  Alcohol abuse  Alcoholic intoxication with complication (HCC)  Suicidal ideations     MEDICATIONS GIVEN DURING THIS VISIT:  Medications - No data to display   ED Discharge Orders    None       Note:  This document was prepared using Dragon voice recognition software and may  include unintentional dictation errors.    Darci CurrentBrown, [place] N, MD 06/12/18 [phone]

## 2018-06-12 NOTE — Discharge Instructions (Addendum)
Please follow-up with [facility] or [facility].  Return for any further problems.

## 2018-06-12 NOTE — ED Notes (Signed)
Pt given meal tray.

## 2018-06-12 NOTE — ED Notes (Signed)
Pt provided with meal tray at this time 

## 2018-06-12 NOTE — ED Notes (Signed)
Pt ate approx 50% of lunch tray at this time.

## 2018-06-12 NOTE — ED Provider Notes (Signed)
Patient is no longer drunk.  He is not suicidal.  He assures me he is not suicidal unless he is drunk.  He wants to try to go to [facility].  Apparently the new policy is that the patient is to call himself.  Patient has a phone he is calling.  We will end up having to discharge in 1 where the other.   Arnaldo Natal, MD 06/12/18 2013

## 2018-06-14 ENCOUNTER — Emergency Department
Admission: EM | Admit: 2018-06-14 | Discharge: 2018-06-14 | Disposition: A | Discharge status: Home / Self Care | Payer: Medicaid Other | Attending: Emergency Medicine | Admitting: Emergency Medicine

## 2018-06-14 DIAGNOSIS — Z7984 Long term (current) use of oral hypoglycemic drugs: Secondary | ICD-10-CM | POA: Diagnosis not present

## 2018-06-14 DIAGNOSIS — Z79899 Other long term (current) drug therapy: Secondary | ICD-10-CM | POA: Insufficient documentation

## 2018-06-14 DIAGNOSIS — I110 Hypertensive heart disease with heart failure: Secondary | ICD-10-CM | POA: Diagnosis not present

## 2018-06-14 DIAGNOSIS — I5042 Chronic combined systolic (congestive) and diastolic (congestive) heart failure: Secondary | ICD-10-CM | POA: Diagnosis not present

## 2018-06-14 DIAGNOSIS — Z89422 Acquired absence of other left toe(s): Secondary | ICD-10-CM | POA: Insufficient documentation

## 2018-06-14 DIAGNOSIS — E119 Type 2 diabetes mellitus without complications: Secondary | ICD-10-CM | POA: Diagnosis not present

## 2018-06-14 DIAGNOSIS — F17210 Nicotine dependence, cigarettes, uncomplicated: Secondary | ICD-10-CM | POA: Diagnosis not present

## 2018-06-14 DIAGNOSIS — F10920 Alcohol use, unspecified with intoxication, uncomplicated: Secondary | ICD-10-CM | POA: Insufficient documentation

## 2018-06-14 DIAGNOSIS — Z008 Encounter for other general examination: Secondary | ICD-10-CM | POA: Diagnosis present

## 2018-06-14 LAB — COMPREHENSIVE METABOLIC PANEL
ALK PHOS: 75 U/L (ref 38–126)
ALT: 14 U/L (ref 0–44)
AST: 17 U/L (ref 15–41)
Albumin: 3.3 g/dL — ABNORMAL LOW (ref 3.5–5.0)
Anion gap: 10 (ref 5–15)
BUN: 8 mg/dL (ref 6–20)
CO2: 25 mmol/L (ref 22–32)
Calcium: 8.3 mg/dL — ABNORMAL LOW (ref 8.9–10.3)
Chloride: 107 mmol/L (ref 98–111)
Creatinine, Ser: 0.67 mg/dL (ref 0.61–1.24)
GFR calc Af Amer: >60 mL/min (ref >60)
GFR calc non Af Amer: >60 mL/min (ref >60)
Glucose, Bld: 129 mg/dL — ABNORMAL HIGH (ref 70–99)
Potassium: 3.1 mmol/L — ABNORMAL LOW (ref 3.5–5.1)
SODIUM: 142 mmol/L (ref 135–145)
Total Bilirubin: 0.4 mg/dL (ref 0.3–1.2)
Total Protein: 5.9 g/dL — ABNORMAL LOW (ref 6.5–8.1)

## 2018-06-14 LAB — CBC WITH DIFFERENTIAL/PLATELET
ABS IMMATURE GRANULOCYTES: 0 10*3/uL (ref 0.00–0.07)
BASOS PCT: 2 %
Basophils Absolute: 0.1 10*3/uL (ref 0.0–0.1)
Eosinophils Absolute: 0.1 10*3/uL (ref 0.0–0.5)
Eosinophils Relative: 1 %
HCT: 36.1 % — ABNORMAL LOW (ref 39.0–52.0)
Hemoglobin: 11.2 g/dL — ABNORMAL LOW (ref 13.0–17.0)
Immature Granulocytes: 0 %
Lymphocytes Relative: 50 %
Lymphs Abs: 2.4 10*3/uL (ref 0.7–4.0)
MCH: 26.7 pg (ref 26.0–34.0)
MCHC: 31 g/dL (ref 30.0–36.0)
MCV: 86.2 fL (ref 80.0–100.0)
Monocytes Absolute: 0.5 10*3/uL (ref 0.1–1.0)
Monocytes Relative: 11 %
Neutro Abs: 1.7 10*3/uL (ref 1.7–7.7)
Neutrophils Relative %: 36 %
PLATELETS: 221 10*3/uL (ref 150–400)
RBC: 4.19 MIL/uL — ABNORMAL LOW (ref 4.22–5.81)
RDW: 21.3 % — ABNORMAL HIGH (ref 11.5–15.5)
WBC: 4.7 10*3/uL (ref 4.0–10.5)
nRBC: 0 % (ref 0.0–0.2)

## 2018-06-14 LAB — ETHANOL: Alcohol, Ethyl (B): 234 mg/dL — ABNORMAL HIGH (ref <10)

## 2018-06-14 LAB — LIPASE, BLOOD: Lipase: 48 U/L (ref 11–51)

## 2018-06-14 MED ORDER — HYDROXYZINE HCL 25 MG PO TABS
50.0000 mg | ORAL_TABLET | Freq: Once | ORAL | Status: AC
  Administered 2018-06-14: 50 mg via ORAL
  Filled 2018-06-14: qty 2

## 2018-06-14 MED ORDER — THIAMINE HCL 100 MG/ML IJ SOLN
Freq: Once | INTRAVENOUS | Status: AC
  Administered 2018-06-14: 09:00:00 via INTRAVENOUS
  Filled 2018-06-14: qty 1000

## 2018-06-14 MED ORDER — LORAZEPAM 2 MG PO TABS: 2.0000 mg | ORAL_TABLET | Freq: Once | ORAL | Status: DC

## 2018-06-14 MED ORDER — CHLORDIAZEPOXIDE HCL 25 MG PO CAPS: ORAL_CAPSULE | ORAL | 0 refills | Status: DC

## 2018-06-14 MED ORDER — LORAZEPAM 2 MG/ML IJ SOLN: 0.0000 mg | Freq: Four times a day (QID) | INTRAMUSCULAR | Status: DC

## 2018-06-14 MED ORDER — THIAMINE HCL 100 MG/ML IJ SOLN
100.0000 mg | Freq: Every day | INTRAMUSCULAR | Status: DC
  Administered 2018-06-14: 100 mg via INTRAVENOUS
  Filled 2018-06-14: qty 2

## 2018-06-14 MED ORDER — SODIUM CHLORIDE 0.9 % IV BOLUS
1000.0000 mL | Freq: Once | INTRAVENOUS | Status: AC
  Administered 2018-06-14: 1000 mL via INTRAVENOUS

## 2018-06-14 MED ORDER — LORAZEPAM 2 MG PO TABS
2.0000 mg | ORAL_TABLET | Freq: Once | ORAL | Status: AC
  Administered 2018-06-14: 2 mg via ORAL
  Filled 2018-06-14: qty 1

## 2018-06-14 NOTE — ED Notes (Signed)
Pt moved from room 7 to 1H and resumed care from [place], [place]. Pt in bed at present time sleeping with fluids infusing.

## 2018-06-14 NOTE — ED Provider Notes (Signed)
[facility] Emergency Department Provider Note   ____________________________________________   First MD Initiated Contact with Patient 06/14/18 [phone]     (approximate)  I have reviewed the triage vital signs and the nursing notes.   HISTORY  Chief Complaint Alcohol Problem  Level V caveat: Limited by intoxication  HPI Kurt Horton is a 58 y.o. male brought to the ED from home via EMS with a chief complaint of alcohol intoxication and requesting detox.  Patient is a recalcitrant alcoholic who has been seen frequently in the past week for same.  Tonight he denies active SI/HI/AH/VH.  History of DTs requiring medical admission.  Rest of history is unobtainable as patient is opening eyes to voice but declines to answer my questions.  He is answering some triage questions with the nurse.    Past Medical History:  Diagnosis Date  . A-fib (HCC)   . Alcohol abuse   . Alcohol withdrawal (HCC) 11/12/2016  . Diabetes mellitus without complication (HCC)   . DJD (degenerative joint disease)   . Hypertension   . Myasthenia gravis (HCC)   . Renal disorder     Patient Active Problem List   Diagnosis Date Noted  . Tobacco use disorder 05/27/2018  . GI bleed 02/04/2018  . Alcohol intoxication (HCC) 12/22/2017  . Malnutrition of moderate degree 10/04/2017  . Chronic combined systolic and diastolic CHF (congestive heart failure) (HCC) 08/13/2017  . Alcohol withdrawal delirium (HCC) 02/26/2017  . Atrial fibrillation with RVR (HCC) 02/25/2017  . Acute alcoholic intoxication with complication (HCC)   . Alcohol use disorder, severe, dependence (HCC)   . Substance induced mood disorder (HCC) 11/13/2016  . SVT (supraventricular tachycardia) (HCC) 11/12/2016  . HTN (hypertension) 11/12/2016  . EtOH dependence (HCC) 11/12/2016  . Lumbar radiculopathy 08/24/2016  . Major depressive disorder, recurrent severe without psychotic features (HCC) 08/24/2016  . Chronic  low back pain 01/04/2016  . Pressure ulcer 12/29/2015  . Osteomyelitis (HCC) 12/26/2015  . PVD (peripheral vascular disease) (HCC) 12/26/2015  . Type II diabetes mellitus with manifestations (HCC) 12/26/2015  . Myasthenia gravis (HCC) 12/26/2015    Past Surgical History:  Procedure Laterality Date  . AMPUTATION TOE Left 12/29/2015   Procedure: AMPUTATION TOE;  Surgeon: Gwyneth RevelsJustin Fowler, DPM;  Location: [facility] ORS;  Service: Podiatry;  Laterality: Left;  . ESOPHAGOGASTRODUODENOSCOPY N/A 02/05/2018   Procedure: ESOPHAGOGASTRODUODENOSCOPY (EGD);  Surgeon: Pasty Spillersahiliani, Varnita B, MD;  [facility];  Service: Endoscopy;  Laterality: N/A;  . PERIPHERAL VASCULAR CATHETERIZATION N/A 12/27/2015   Procedure: Lower Extremity Angiography;  Surgeon: Annice NeedyJason S Dew, MD;  Location: [facility] INVASIVE CV LAB;  Service: Cardiovascular;  Laterality: N/A;  . right hip surgery    . right shoulder surgery      Prior to Admission medications   Medication Sig Start Date End Date Taking? Authorizing Provider  B Complex-C (B-COMPLEX WITH VITAMIN C) tablet Take 1 tablet by mouth daily. 11/02/17   Alford HighlandWieting, Richard, MD  cloNIDine (CATAPRES - DOSED IN MG/24 HR) 0.1 mg/24hr patch Place 1 patch (0.1 mg total) onto the skin once a week. Patient not taking: Reported on 06/12/2018 05/30/18   Pucilowska, Braulio ConteJolanta B, MD  gabapentin (NEURONTIN) 300 MG capsule Take 2 capsules (600 mg total) by mouth 3 (three) times daily. 05/30/18   Pucilowska, Braulio ConteJolanta B, MD  metFORMIN (GLUCOPHAGE) 500 MG tablet Take 1 tablet (500 mg total) by mouth daily. 05/30/18   Pucilowska, Braulio ConteJolanta B, MD  metoprolol succinate (TOPROL-XL) 50 MG 24 hr tablet  Take 1 tablet (50 mg total) by mouth daily. Take with or immediately following a meal. 05/30/18   Pucilowska, Jolanta B, MD  mirtazapine (REMERON) 15 MG tablet Take 1 tablet (15 mg total) by mouth at bedtime. Patient not taking: Reported on 06/12/2018 05/30/18   Pucilowska, Ellin Goodie, MD  multivitamin (ONE-A-DAY  MEN'S) TABS tablet Take 1 tablet by mouth daily. 04/19/18   Shaune Pollack, MD  mycophenolate (CELLCEPT) 500 MG tablet Take 1 tablet (500 mg total) by mouth 2 (two) times daily. Take 1 tablet (500MG ) by mouth every morning and 2 tablets (1000MG ) by mouth every evening 05/30/18   Pucilowska, Jolanta B, MD  senna-docusate (SENOKOT-S) 8.6-50 MG tablet Take 1 tablet by mouth at bedtime as needed for mild constipation. Patient taking differently: Take 1 tablet by mouth daily.  12/25/17   Mayo, Allyn Kenner, MD  testosterone cypionate (DEPOTESTOSTERONE CYPIONATE) 200 MG/ML injection Inject 200 mg into the muscle every 14 (fourteen) days.    [provider]  thiamine 100 MG tablet Take 1 tablet (100 mg total) by mouth daily. 05/30/18   Pucilowska, Braulio Conte B, MD  traZODone (DESYREL) 100 MG tablet Take 1 tablet (100 mg total) by mouth at bedtime. 05/30/18   Pucilowska, Ellin Goodie, MD    Allergies Nsaids and Other  Family History  Problem Relation Age of Onset  . Rheum arthritis Father     Social History Social History   Tobacco Use  . Smoking status: Current Every Day Smoker    Packs/day: 0.50    Types: Cigarettes  . Smokeless tobacco: Never Used  Substance Use Topics  . Alcohol use: Yes    Alcohol/week: 20.0 - 30.0 standard drinks    Types: 20 - 30 Shots of liquor per week    Comment: Pt had 20 airplane bottles today  . Drug use: Yes    Types: Oxycodone    Review of Systems  Constitutional: No fever/chills Eyes: No visual changes. ENT: No sore throat. Cardiovascular: Denies chest pain. Respiratory: Denies shortness of breath. Gastrointestinal: No abdominal pain.  No nausea, no vomiting.  No diarrhea.  No constipation. Genitourinary: Negative for dysuria. Musculoskeletal: Negative for back pain. Skin: Negative for rash. Neurological: Negative for headaches, focal weakness or numbness. Psychiatric: Positive for  intoxication.  ____________________________________________   PHYSICAL EXAM:  VITAL SIGNS: ED Triage Vitals  Enc Vitals Group     BP      Pulse      Resp      Temp      Temp src      SpO2      Weight      Height      Head Circumference      Peak Flow      Pain Score      Pain Loc      Pain Edu?      Excl. in GC?     Constitutional: Eyes open to voice..  Disheveled appearing and in no acute distress. Eyes: Conjunctivae are normal. PERRL. EOMI. Head: Atraumatic. Nose: No congestion/rhinnorhea. Mouth/Throat: Mucous membranes are moist.  Oropharynx non-erythematous. Neck: No stridor.  No cervical spine tenderness to palpation. Cardiovascular: Normal rate, regular rhythm. Grossly normal heart sounds.  Good peripheral circulation. Respiratory: Normal respiratory effort.  No retractions. Lungs CTAB. Gastrointestinal: Soft and nontender. No distention. No abdominal bruits. No CVA tenderness. Musculoskeletal: No lower extremity tenderness nor edema.  No joint effusions. Neurologic:  Normal speech and language. No gross focal neurologic deficits are  appreciated. No gait instability. Skin:  Skin is warm, dry and intact. No rash noted. Psychiatric: Mood and affect are flat. Speech and behavior are normal.  ____________________________________________   LABS (all labs ordered are listed, but only abnormal results are displayed)  Labs Reviewed  CBC WITH DIFFERENTIAL/PLATELET - Abnormal; Notable for the following components:      Result Value   RBC 4.19 (*)    Hemoglobin 11.2 (*)    HCT 36.1 (*)    RDW 21.3 (*)    All other components within normal limits  COMPREHENSIVE METABOLIC PANEL - Abnormal; Notable for the following components:   Potassium 3.1 (*)    Glucose, Bld 129 (*)    Calcium 8.3 (*)    Total Protein 5.9 (*)    Albumin 3.3 (*)    All other components within normal limits  ETHANOL - Abnormal; Notable for the following components:   Alcohol, Ethyl (B) 234 (*)     All other components within normal limits  LIPASE, BLOOD   ____________________________________________  EKG  ED ECG REPORT I, Nerine Pulse J, the attending physician, personally viewed and interpreted this ECG.   Date: 06/14/2018  EKG Time: 0338  Rate: 109  Rhythm: sinus tachycardia  Axis: LAD  Intervals:left anterior fascicular block  ST&T Change: Nonspecific  ____________________________________________  RADIOLOGY  ED MD interpretation: None  Official radiology report(s): No results found.  ____________________________________________   PROCEDURES  Procedure(s) performed: None  Procedures  Critical Care performed: No  ____________________________________________   INITIAL IMPRESSION / ASSESSMENT AND PLAN / ED COURSE  As part of my medical decision making, I reviewed the following data within the electronic MEDICAL RECORD NUMBER Nursing notes reviewed and incorporated, Patient signed out to Dr. Manson PasseyBrown and Notes from prior ED visits    58 year old recalcitrant alcoholic brought to the ED by EMS for intoxication and desiring detox.  Will obtain screening lab work, initiate IV fluid resuscitation and place patient on CIWA scale.   Clinical Course as of Jun 14 557  Caleen EssexFri Jun 14, 2018  [id number] Patient transferred to the behavioral medicine side of the emergency department.  Care transferred to Dr. Manson PasseyBrown.   [JS]    Clinical Course User Index [JS] Irean HongSung, Haleema Vanderheyden J, MD     ____________________________________________   FINAL CLINICAL IMPRESSION(S) / ED DIAGNOSES  Final diagnoses:  Alcoholic intoxication without complication [facility](HCC)     ED Discharge Orders    None       Note:  This document was prepared using Dragon voice recognition software and may include unintentional dictation errors.    Irean HongSung, Arul Farabee J, MD 06/14/18 [phone]

## 2018-06-14 NOTE — ED Notes (Signed)

## 2018-06-14 NOTE — ED Notes (Addendum)
He has ambulated to and from the BR with a steady gait   Pt requesting  "Can I go home now"   He also stated  "I will need a cab voucher cause I don't have a ride home "  Pt informed that we do not give cab voucher anymore and he stated  "Well that's a lie cause I got one yesterday."   Banana bag continues to infuse

## 2018-06-14 NOTE — ED Triage Notes (Signed)
Pt arrived EMS from home after consuming 20-30 airplane bottles of alcohol in last 24 hours and is requesting detox. Pt is A&O x4. Pt denies SI and HI at this time.

## 2018-06-14 NOTE — ED Provider Notes (Signed)
Patient appears medically clear for discharge, wants to leave at this time.  He no longer appears intoxicated.   Emily Filbert, MD 06/14/18 [phone]

## 2018-06-14 NOTE — ED Notes (Signed)
BEHAVIORAL HEALTH ROUNDING Patient sleeping: Yes.   Patient alert and oriented: eyes closed  Appears asleep Behavior appropriate: Yes.  ; If no, describe:  Nutrition and fluids offered: Yes  Toileting and hygiene offered: sleeping Sitter present: q 15 minute observations and security monitoring Law enforcement present: yes  ODS 

## 2018-06-24 ENCOUNTER — Other Ambulatory Visit: Payer: Self-pay

## 2018-06-24 ENCOUNTER — Inpatient Hospital Stay
Admission: EM | Admit: 2018-06-24 | Discharge: 2018-06-28 | DRG: 897 | Disposition: A | Discharge status: Home / Self Care | Payer: Medicaid Other | Attending: Internal Medicine | Admitting: Internal Medicine

## 2018-06-24 DIAGNOSIS — F10929 Alcohol use, unspecified with intoxication, unspecified: Secondary | ICD-10-CM

## 2018-06-24 DIAGNOSIS — R531 Weakness: Secondary | ICD-10-CM | POA: Diagnosis present

## 2018-06-24 DIAGNOSIS — F10231 Alcohol dependence with withdrawal delirium: Secondary | ICD-10-CM | POA: Diagnosis present

## 2018-06-24 DIAGNOSIS — I10 Essential (primary) hypertension: Secondary | ICD-10-CM | POA: Diagnosis present

## 2018-06-24 DIAGNOSIS — E876 Hypokalemia: Secondary | ICD-10-CM | POA: Diagnosis present

## 2018-06-24 DIAGNOSIS — Z7984 Long term (current) use of oral hypoglycemic drugs: Secondary | ICD-10-CM | POA: Diagnosis not present

## 2018-06-24 DIAGNOSIS — F419 Anxiety disorder, unspecified: Secondary | ICD-10-CM | POA: Diagnosis present

## 2018-06-24 DIAGNOSIS — F17210 Nicotine dependence, cigarettes, uncomplicated: Secondary | ICD-10-CM | POA: Diagnosis present

## 2018-06-24 DIAGNOSIS — Z8261 Family history of arthritis: Secondary | ICD-10-CM | POA: Diagnosis not present

## 2018-06-24 DIAGNOSIS — Z89422 Acquired absence of other left toe(s): Secondary | ICD-10-CM | POA: Diagnosis not present

## 2018-06-24 DIAGNOSIS — Z888 Allergy status to other drugs, medicaments and biological substances status: Secondary | ICD-10-CM

## 2018-06-24 DIAGNOSIS — Z716 Tobacco abuse counseling: Secondary | ICD-10-CM

## 2018-06-24 DIAGNOSIS — M1990 Unspecified osteoarthritis, unspecified site: Secondary | ICD-10-CM | POA: Diagnosis present

## 2018-06-24 DIAGNOSIS — I351 Nonrheumatic aortic (valve) insufficiency: Secondary | ICD-10-CM | POA: Diagnosis present

## 2018-06-24 DIAGNOSIS — F1010 Alcohol abuse, uncomplicated: Secondary | ICD-10-CM | POA: Diagnosis present

## 2018-06-24 DIAGNOSIS — F1020 Alcohol dependence, uncomplicated: Secondary | ICD-10-CM | POA: Diagnosis present

## 2018-06-24 DIAGNOSIS — I110 Hypertensive heart disease with heart failure: Secondary | ICD-10-CM | POA: Diagnosis present

## 2018-06-24 DIAGNOSIS — E119 Type 2 diabetes mellitus without complications: Secondary | ICD-10-CM | POA: Diagnosis present

## 2018-06-24 DIAGNOSIS — Z79899 Other long term (current) drug therapy: Secondary | ICD-10-CM

## 2018-06-24 DIAGNOSIS — G7000 Myasthenia gravis without (acute) exacerbation: Secondary | ICD-10-CM | POA: Diagnosis present

## 2018-06-24 DIAGNOSIS — I5042 Chronic combined systolic (congestive) and diastolic (congestive) heart failure: Secondary | ICD-10-CM | POA: Diagnosis present

## 2018-06-24 DIAGNOSIS — I4891 Unspecified atrial fibrillation: Secondary | ICD-10-CM

## 2018-06-24 DIAGNOSIS — F10931 Alcohol use, unspecified with withdrawal delirium: Secondary | ICD-10-CM | POA: Diagnosis present

## 2018-06-24 LAB — URINALYSIS, COMPLETE (UACMP) WITH MICROSCOPIC
BACTERIA UA: NONE SEEN
Bilirubin Urine: NEGATIVE
Glucose, UA: NEGATIVE mg/dL
Hgb urine dipstick: NEGATIVE
KETONES UR: NEGATIVE mg/dL
Leukocytes,Ua: NEGATIVE
Nitrite: NEGATIVE
Protein, ur: NEGATIVE mg/dL
SQUAMOUS EPITHELIAL / LPF: NONE SEEN (ref 0–5)
Specific Gravity, Urine: 1.016 (ref 1.005–1.030)
pH: 6 (ref 5.0–8.0)

## 2018-06-24 LAB — URINE DRUG SCREEN, QUALITATIVE (ARMC ONLY)
Amphetamines, Ur Screen: NOT DETECTED
Barbiturates, Ur Screen: NOT DETECTED
Benzodiazepine, Ur Scrn: POSITIVE — AB
Cannabinoid 50 Ng, Ur ~~LOC~~: NOT DETECTED
Cocaine Metabolite,Ur ~~LOC~~: NOT DETECTED
MDMA (Ecstasy)Ur Screen: NOT DETECTED
Methadone Scn, Ur: NOT DETECTED
Opiate, Ur Screen: NOT DETECTED
Phencyclidine (PCP) Ur S: NOT DETECTED
Tricyclic, Ur Screen: NOT DETECTED

## 2018-06-24 LAB — MAGNESIUM: Magnesium: 1.6 mg/dL — ABNORMAL LOW (ref 1.7–2.4)

## 2018-06-24 LAB — CBC WITH DIFFERENTIAL/PLATELET
Abs Immature Granulocytes: 0.02 10*3/uL (ref 0.00–0.07)
Basophils Absolute: 0.1 10*3/uL (ref 0.0–0.1)
Basophils Relative: 1 %
Eosinophils Absolute: 0 10*3/uL (ref 0.0–0.5)
Eosinophils Relative: 1 %
HCT: 39.6 % (ref 39.0–52.0)
Hemoglobin: 12.8 g/dL — ABNORMAL LOW (ref 13.0–17.0)
Immature Granulocytes: 0 %
Lymphocytes Relative: 23 %
Lymphs Abs: 1.6 10*3/uL (ref 0.7–4.0)
MCH: 27.3 pg (ref 26.0–34.0)
MCHC: 32.3 g/dL (ref 30.0–36.0)
MCV: 84.4 fL (ref 80.0–100.0)
MONOS PCT: 8 %
Monocytes Absolute: 0.6 10*3/uL (ref 0.1–1.0)
Neutro Abs: 4.7 10*3/uL (ref 1.7–7.7)
Neutrophils Relative %: 67 %
Platelets: 227 10*3/uL (ref 150–400)
RBC: 4.69 MIL/uL (ref 4.22–5.81)
RDW: 20.3 % — ABNORMAL HIGH (ref 11.5–15.5)
WBC: 7 10*3/uL (ref 4.0–10.5)
nRBC: 0 % (ref 0.0–0.2)

## 2018-06-24 LAB — COMPREHENSIVE METABOLIC PANEL
ALT: 17 U/L (ref 0–44)
AST: 27 U/L (ref 15–41)
Albumin: 3.6 g/dL (ref 3.5–5.0)
Alkaline Phosphatase: 87 U/L (ref 38–126)
Anion gap: 14 (ref 5–15)
BUN: 9 mg/dL (ref 6–20)
CO2: 24 mmol/L (ref 22–32)
Calcium: 8.7 mg/dL — ABNORMAL LOW (ref 8.9–10.3)
Chloride: 101 mmol/L (ref 98–111)
Creatinine, Ser: 0.67 mg/dL (ref 0.61–1.24)
GFR calc Af Amer: >60 mL/min (ref >60)
GFR calc non Af Amer: >60 mL/min (ref >60)
Glucose, Bld: 143 mg/dL — ABNORMAL HIGH (ref 70–99)
POTASSIUM: 3.2 mmol/L — AB (ref 3.5–5.1)
Sodium: 139 mmol/L (ref 135–145)
Total Bilirubin: 0.7 mg/dL (ref 0.3–1.2)
Total Protein: 6.4 g/dL — ABNORMAL LOW (ref 6.5–8.1)

## 2018-06-24 LAB — GLUCOSE, CAPILLARY
Glucose-Capillary: 182 mg/dL — ABNORMAL HIGH (ref 70–99)
Glucose-Capillary: 156 mg/dL — ABNORMAL HIGH (ref 70–99)

## 2018-06-24 LAB — TSH: TSH: 0.865 u[IU]/mL (ref 0.350–4.500)

## 2018-06-24 LAB — ETHANOL: Alcohol, Ethyl (B): 168 mg/dL — ABNORMAL HIGH (ref <10)

## 2018-06-24 LAB — TROPONIN I: Troponin I: <0.03 ng/mL (ref <0.03)

## 2018-06-24 LAB — LIPASE, BLOOD: Lipase: 42 U/L (ref 11–51)

## 2018-06-24 LAB — SALICYLATE LEVEL: Salicylate Lvl: <7 mg/dL (ref 2.8–30.0)

## 2018-06-24 LAB — ACETAMINOPHEN LEVEL: Acetaminophen (Tylenol), Serum: <10 ug/mL — ABNORMAL LOW (ref 10–30)

## 2018-06-24 MED ORDER — OXYCODONE HCL 5 MG PO TABS
5.0000 mg | ORAL_TABLET | Freq: Four times a day (QID) | ORAL | Status: DC | PRN
  Administered 2018-06-24 – 2018-06-28 (×10): 5 mg via ORAL
  Filled 2018-06-24 (×11): qty 1

## 2018-06-24 MED ORDER — MYCOPHENOLATE MOFETIL 250 MG PO CAPS
1000.0000 mg | ORAL_CAPSULE | Freq: Every day | ORAL | Status: DC
  Administered 2018-06-24 – 2018-06-27 (×4): 1000 mg via ORAL
  Filled 2018-06-24 (×6): qty 4

## 2018-06-24 MED ORDER — THIAMINE HCL 100 MG/ML IJ SOLN
100.0000 mg | Freq: Every day | INTRAMUSCULAR | Status: DC
  Filled 2018-06-24: qty 2

## 2018-06-24 MED ORDER — DILTIAZEM HCL 25 MG/5ML IV SOLN
INTRAVENOUS | Status: AC
  Administered 2018-06-24: 10 mg via INTRAVENOUS
  Filled 2018-06-24: qty 5

## 2018-06-24 MED ORDER — CLONIDINE HCL 0.1 MG/24HR TD PTWK
0.1000 mg | MEDICATED_PATCH | TRANSDERMAL | Status: DC
  Administered 2018-06-24: 0.1 mg via TRANSDERMAL
  Filled 2018-06-24: qty 1

## 2018-06-24 MED ORDER — ADULT MULTIVITAMIN W/MINERALS CH
1.0000 | ORAL_TABLET | Freq: Every day | ORAL | Status: DC
  Administered 2018-06-24 – 2018-06-28 (×5): 1 via ORAL
  Filled 2018-06-24 (×5): qty 1

## 2018-06-24 MED ORDER — DILTIAZEM HCL 25 MG/5ML IV SOLN
10.0000 mg | Freq: Once | INTRAVENOUS | Status: AC
  Administered 2018-06-24: 10 mg via INTRAVENOUS

## 2018-06-24 MED ORDER — FOLIC ACID 1 MG PO TABS
1.0000 mg | ORAL_TABLET | Freq: Every day | ORAL | Status: DC
  Administered 2018-06-24 – 2018-06-28 (×5): 1 mg via ORAL
  Filled 2018-06-24 (×5): qty 1

## 2018-06-24 MED ORDER — DILTIAZEM HCL-DEXTROSE 100-5 MG/100ML-% IV SOLN (PREMIX)
5.0000 mg/h | INTRAVENOUS | Status: DC
  Administered 2018-06-24: 12.5 mg/h via INTRAVENOUS
  Administered 2018-06-24: 5 mg/h via INTRAVENOUS
  Administered 2018-06-25: 15 mg/h via INTRAVENOUS
  Filled 2018-06-24 (×4): qty 100

## 2018-06-24 MED ORDER — SODIUM CHLORIDE 0.9 % IV SOLN
INTRAVENOUS | Status: DC
  Administered 2018-06-25 – 2018-06-27 (×5): via INTRAVENOUS

## 2018-06-24 MED ORDER — TRAZODONE HCL 100 MG PO TABS
100.0000 mg | ORAL_TABLET | Freq: Every day | ORAL | Status: DC
  Administered 2018-06-24 – 2018-06-27 (×4): 100 mg via ORAL
  Filled 2018-06-24 (×4): qty 1

## 2018-06-24 MED ORDER — LORAZEPAM 2 MG PO TABS
2.0000 mg | ORAL_TABLET | ORAL | Status: AC | PRN
  Administered 2018-06-24 – 2018-06-25 (×3): 2 mg via ORAL
  Administered 2018-06-25: 1 mg via ORAL
  Administered 2018-06-26 – 2018-06-27 (×4): 2 mg via ORAL
  Filled 2018-06-24 (×8): qty 1

## 2018-06-24 MED ORDER — MYCOPHENOLATE MOFETIL 250 MG PO CAPS
500.0000 mg | ORAL_CAPSULE | Freq: Every morning | ORAL | Status: DC
  Administered 2018-06-25 – 2018-06-28 (×4): 500 mg via ORAL
  Filled 2018-06-24 (×4): qty 2

## 2018-06-24 MED ORDER — VITAMIN B-1 100 MG PO TABS
100.0000 mg | ORAL_TABLET | Freq: Every day | ORAL | Status: DC
  Administered 2018-06-25: 100 mg via ORAL
  Filled 2018-06-24: qty 1

## 2018-06-24 MED ORDER — LORAZEPAM 2 MG/ML IJ SOLN
2.0000 mg | INTRAMUSCULAR | Status: AC | PRN
  Administered 2018-06-24 – 2018-06-27 (×3): 2 mg via INTRAVENOUS
  Filled 2018-06-24 (×3): qty 1

## 2018-06-24 MED ORDER — THIAMINE HCL 100 MG/ML IJ SOLN
Freq: Once | INTRAVENOUS | Status: AC
  Administered 2018-06-24: 14:00:00 via INTRAVENOUS
  Filled 2018-06-24: qty 1000

## 2018-06-24 MED ORDER — SODIUM CHLORIDE 0.9 % IV BOLUS
1000.0000 mL | Freq: Once | INTRAVENOUS | Status: AC
  Administered 2018-06-24: 1000 mL via INTRAVENOUS

## 2018-06-24 MED ORDER — DOCUSATE SODIUM 100 MG PO CAPS: 100.0000 mg | ORAL_CAPSULE | Freq: Two times a day (BID) | ORAL | Status: DC | PRN

## 2018-06-24 MED ORDER — POTASSIUM CHLORIDE CRYS ER 20 MEQ PO TBCR
20.0000 meq | EXTENDED_RELEASE_TABLET | Freq: Two times a day (BID) | ORAL | Status: DC
  Administered 2018-06-24 – 2018-06-28 (×8): 20 meq via ORAL
  Filled 2018-06-24 (×8): qty 1

## 2018-06-24 MED ORDER — VITAMIN B-1 100 MG PO TABS
100.0000 mg | ORAL_TABLET | Freq: Every day | ORAL | Status: DC
  Administered 2018-06-25 – 2018-06-28 (×4): 100 mg via ORAL
  Filled 2018-06-24 (×4): qty 1

## 2018-06-24 MED ORDER — SENNOSIDES-DOCUSATE SODIUM 8.6-50 MG PO TABS
1.0000 | ORAL_TABLET | Freq: Every day | ORAL | Status: DC
  Administered 2018-06-24 – 2018-06-28 (×4): 1 via ORAL
  Filled 2018-06-24 (×5): qty 1

## 2018-06-24 MED ORDER — NICOTINE 21 MG/24HR TD PT24
21.0000 mg | MEDICATED_PATCH | Freq: Every day | TRANSDERMAL | Status: DC
  Administered 2018-06-25 – 2018-06-28 (×4): 21 mg via TRANSDERMAL
  Filled 2018-06-24 (×4): qty 1

## 2018-06-24 MED ORDER — METOPROLOL SUCCINATE ER 50 MG PO TB24
50.0000 mg | ORAL_TABLET | Freq: Every day | ORAL | Status: DC
  Administered 2018-06-24 – 2018-06-25 (×2): 50 mg via ORAL
  Filled 2018-06-24 (×2): qty 1

## 2018-06-24 MED ORDER — DILTIAZEM HCL 25 MG/5ML IV SOLN
20.0000 mg | Freq: Once | INTRAVENOUS | Status: AC
  Administered 2018-06-24: 20 mg via INTRAVENOUS
  Filled 2018-06-24: qty 5

## 2018-06-24 MED ORDER — INSULIN ASPART 100 UNIT/ML ~~LOC~~ SOLN: 0.0000 [IU] | Freq: Every day | SUBCUTANEOUS | Status: DC

## 2018-06-24 MED ORDER — INSULIN ASPART 100 UNIT/ML ~~LOC~~ SOLN
0.0000 [IU] | Freq: Three times a day (TID) | SUBCUTANEOUS | Status: DC
  Administered 2018-06-24: 2 [IU] via SUBCUTANEOUS
  Administered 2018-06-25 – 2018-06-28 (×7): 1 [IU] via SUBCUTANEOUS
  Filled 2018-06-24 (×8): qty 1

## 2018-06-24 MED ORDER — GABAPENTIN 300 MG PO CAPS
600.0000 mg | ORAL_CAPSULE | Freq: Three times a day (TID) | ORAL | Status: DC
  Administered 2018-06-24 – 2018-06-28 (×12): 600 mg via ORAL
  Filled 2018-06-24 (×13): qty 2

## 2018-06-24 MED ORDER — B COMPLEX-C PO TABS
1.0000 | ORAL_TABLET | Freq: Every day | ORAL | Status: DC
  Administered 2018-06-24 – 2018-06-28 (×5): 1 via ORAL
  Filled 2018-06-24 (×5): qty 1

## 2018-06-24 MED ORDER — HEPARIN SODIUM (PORCINE) 5000 UNIT/ML IJ SOLN
5000.0000 [IU] | Freq: Three times a day (TID) | INTRAMUSCULAR | Status: DC
  Administered 2018-06-24 – 2018-06-28 (×11): 5000 [IU] via SUBCUTANEOUS
  Filled 2018-06-24 (×11): qty 1

## 2018-06-24 NOTE — ED Notes (Signed)
Moved to 24

## 2018-06-24 NOTE — H&P (Signed)
[employer] [facility] [place] [facility]   PATIENT NAME: Kurt SimmerJohn Horton    MR#:  [id number]  DATE OF BIRTH:  04/02/1961  DATE OF ADMISSION:  06/24/2018  PRIMARY CARE PHYSICIAN: Barbette ReichmannHande, Vishwanath, MD   REQUESTING/REFERRING PHYSICIAN: Malinda  CHIEF COMPLAINT:   Chief Complaint  Patient presents with  . Alcohol Intoxication    HISTORY OF PRESENT ILLNESS: Kurt SimmerJohn Horton  is a 58 y.o. male with a known history of atrial fibrillation, alcohol abuse, diabetes, hypertension, myasthenia gravis-brought by family because of shaking and alcohol abuse.  Noted to have A. fib with RVR in ER and started on Cardizem drip.  Patient did not have any complaint of palpitation or chest pain.  He had excessive shaking.  He drinks 20 of airplane-sized bottles of rum every day.  Last drink was today morning. ER physician gave some Ativan injection to prevent alcohol withdrawal and called hospitalist service for further management.  PAST MEDICAL HISTORY:   Past Medical History:  Diagnosis Date  . A-fib (HCC)   . Alcohol abuse   . Alcohol withdrawal (HCC) 11/12/2016  . Diabetes mellitus without complication (HCC)   . DJD (degenerative joint disease)   . Hypertension   . Myasthenia gravis (HCC)   . Renal disorder     PAST SURGICAL HISTORY:  Past Surgical History:  Procedure Laterality Date  . AMPUTATION TOE Left 12/29/2015   Procedure: AMPUTATION TOE;  Surgeon: Gwyneth RevelsJustin Fowler, DPM;  Location: [facility] ORS;  Service: Podiatry;  Laterality: Left;  . ESOPHAGOGASTRODUODENOSCOPY N/A 02/05/2018   Procedure: ESOPHAGOGASTRODUODENOSCOPY (EGD);  Surgeon: Pasty Spillersahiliani, Varnita B, MD;  Location[facility] END[facility]COPY;  Service: Endoscopy;  Laterality: N/A;  . PERIPHERAL VASCULAR CATHETERIZATION N/A 12/27/2015   Procedure: Lower Extremity Angiography;  Surgeon: Annice NeedyJason S Dew, MD;  Location: [facility] INVASIVE CV LAB;  Service: Cardiovascular;  Laterality: N/A;  . right hip surgery    . right shoulder surgery      SOCIAL HISTORY:   Social History   Tobacco Use  . Smoking status: Current Every Day Smoker    Packs/day: 0.50    Types: Cigarettes  . Smokeless tobacco: Never Used  Substance Use Topics  . Alcohol use: Yes    Alcohol/week: 20.0 - 30.0 standard drinks    Types: 20 - 30 Shots of liquor per week    Comment: Pt had 20 airplane bottles today    FAMILY HISTORY:  Family History  Problem Relation Age of Onset  . Rheum arthritis Father     DRUG ALLERGIES:  Allergies  Allergen Reactions  . Nsaids Other (See Comments)    Duodenal ulcers, GI bleeding  . Other Other (See Comments)    Patient states he can't take any "mycin" medications because myasthenia gravis.    REVIEW OF SYSTEMS:   CONSTITUTIONAL: No fever, fatigue or weakness.  EYES: No blurred or double vision.  EARS, NOSE, AND THROAT: No tinnitus or ear pain.  RESPIRATORY: No cough, shortness of breath, wheezing or hemoptysis.  CARDIOVASCULAR: No chest pain, orthopnea, edema.  GASTROINTESTINAL: No nausea, vomiting, diarrhea or abdominal pain.  GENITOURINARY: No dysuria, hematuria.  ENDOCRINE: No polyuria, nocturia,  HEMATOLOGY: No anemia, easy bruising or bleeding SKIN: No rash or lesion. MUSCULOSKELETAL: No joint pain or arthritis.  Patient has excessive shaking. NEUROLOGIC: No tingling, numbness, weakness.  PSYCHIATRY: No anxiety or depression.   MEDICATIONS AT HOME:  Prior to Admission medications   Medication Sig Start Date End Date Taking? Authorizing Provider  B Complex-C (B-COMPLEX WITH VITAMIN C)  tablet Take 1 tablet by mouth daily. 11/02/17  Yes Wieting, Richard, MD  gabapentin (NEURONTIN) 300 MG capsule Take 2 capsules (600 mg total) by mouth 3 (three) times daily. 05/30/18  Yes Pucilowska, Jolanta B, MD  metFORMIN (GLUCOPHAGE) 500 MG tablet Take 1 tablet (500 mg total) by mouth daily. 05/30/18  Yes Pucilowska, Jolanta B, MD  metoprolol succinate (TOPROL-XL) 50 MG 24 hr tablet Take 1 tablet (50 mg total) by mouth daily. Take with  or immediately following a meal. 05/30/18  Yes Pucilowska, Jolanta B, MD  multivitamin (ONE-A-DAY MEN'S) TABS tablet Take 1 tablet by mouth daily. 04/19/18  Yes Shaune Pollack, MD  mycophenolate (CELLCEPT) 500 MG tablet Take 1 tablet (500 mg total) by mouth 2 (two) times daily. Take 1 tablet (500MG ) by mouth every morning and 2 tablets (1000MG ) by mouth every evening Patient taking differently: Take 500-1,000 mg by mouth See admin instructions. Take 1 tablet (500MG ) by mouth every morning and 2 tablets (1000MG ) by mouth every evening  05/30/18  Yes Pucilowska, Jolanta B, MD  senna-docusate (SENOKOT-S) 8.6-50 MG tablet Take 1 tablet by mouth at bedtime as needed for mild constipation. Patient taking differently: Take 1 tablet by mouth daily.  12/25/17  Yes Mayo, Allyn Kenner, MD  testosterone cypionate (DEPOTESTOSTERONE CYPIONATE) 200 MG/ML injection Inject 200 mg into the muscle every 14 (fourteen) days.   Yes [provider]  thiamine 100 MG tablet Take 1 tablet (100 mg total) by mouth daily. 05/30/18  Yes Pucilowska, Jolanta B, MD  chlordiazePOXIDE (LIBRIUM) 25 MG capsule Take 1 tablet by mouth 5 times a day on day 1, then decrease by 1 tablet daily until gone. 06/14/18   Emily Filbert, MD  cloNIDine (CATAPRES - DOSED IN MG/24 HR) 0.1 mg/24hr patch Place 1 patch (0.1 mg total) onto the skin once a week. Patient not taking: Reported on 06/12/2018 05/30/18   Pucilowska, Braulio Conte B, MD  mirtazapine (REMERON) 15 MG tablet Take 1 tablet (15 mg total) by mouth at bedtime. Patient not taking: Reported on 06/12/2018 05/30/18   Pucilowska, Ellin Goodie, MD  traZODone (DESYREL) 100 MG tablet Take 1 tablet (100 mg total) by mouth at bedtime. 05/30/18   Pucilowska, Jolanta B, MD      PHYSICAL EXAMINATION:   VITAL SIGNS: Blood pressure 124/81, pulse (!) 143, temperature 98.1 F (36.7 C), temperature source Oral, resp. rate 16, height 5\' 11"  (1.803 m), weight 81.5 kg, SpO2 99 %.  GENERAL:  58 y.o.-year-old patient  lying in the bed with no acute distress.  Appears anxious. EYES: Pupils equal, round, reactive to light and accommodation. No scleral icterus. Extraocular muscles intact.  HEENT: Head atraumatic, normocephalic. Oropharynx and nasopharynx clear.  NECK:  Supple, no jugular venous distention. No thyroid enlargement, no tenderness.  LUNGS: Normal breath sounds bilaterally, no wheezing, rales,rhonchi or crepitation. No use of accessory muscles of respiration.  CARDIOVASCULAR: S1, S2 fast and regular. No murmurs, rubs, or gallops.  ABDOMEN: Soft, nontender, nondistended. Bowel sounds present. No organomegaly or mass.  EXTREMITIES: No pedal edema, cyanosis, or clubbing.  NEUROLOGIC: Cranial nerves II through XII are intact. Muscle strength 5/5 in all extremities. Sensation intact. Gait not checked.  Excessive shaking. PSYCHIATRIC: The patient is alert and oriented x 3.  SKIN: No obvious rash, lesion, or ulcer.   LABORATORY PANEL:   CBC Recent Labs  Lab 06/24/18 1154  WBC 7.0  HGB 12.8*  HCT 39.6  PLT 227  MCV 84.4  MCH 27.3  MCHC 32.3  RDW 20.3*  LYMPHSABS 1.6  MONOABS 0.6  EOSABS 0.0  BASOSABS 0.1   ------------------------------------------------------------------------------------------------------------------  Chemistries  Recent Labs  Lab 06/24/18 1154  NA 139  K 3.2*  CL 101  CO2 24  GLUCOSE 143*  BUN 9  CREATININE 0.67  CALCIUM 8.7*  AST 27  ALT 17  ALKPHOS 87  BILITOT 0.7   ------------------------------------------------------------------------------------------------------------------ estimated creatinine clearance is 108.5 mL/min (by C-G formula based on SCr of 0.67 mg/dL). ------------------------------------------------------------------------------------------------------------------ Recent Labs    06/24/18 1154  TSH 0.865     Coagulation profile No results for input(s): INR, PROTIME in the last 168  hours. ------------------------------------------------------------------------------------------------------------------- No results for input(s): DDIMER in the last 72 hours. -------------------------------------------------------------------------------------------------------------------  Cardiac Enzymes Recent Labs  Lab 06/24/18 1154  TROPONINI <0.03   ------------------------------------------------------------------------------------------------------------------ Invalid input(s): POCBNP  ---------------------------------------------------------------------------------------------------------------  Urinalysis    Component Value Date/Time   COLORURINE YELLOW (A) 01/19/2018 1441   APPEARANCEUR CLEAR (A) 01/19/2018 1441   LABSPEC 1.008 01/19/2018 1441   PHURINE 6.0 01/19/2018 1441   GLUCOSEU NEGATIVE 01/19/2018 1441   HGBUR NEGATIVE 01/19/2018 1441   BILIRUBINUR NEGATIVE 01/19/2018 1441   KETONESUR NEGATIVE 01/19/2018 1441   PROTEINUR NEGATIVE 01/19/2018 1441   NITRITE NEGATIVE 01/19/2018 1441   LEUKOCYTESUR NEGATIVE 01/19/2018 1441     RADIOLOGY: No results found.  EKG: Orders placed or performed during the hospital encounter of 06/24/18  . ED EKG  . ED EKG    IMPRESSION AND PLAN:  *A. fib with RVR ER physician started on Cardizem IV drip. Will monitor on telemetry and get an echocardiogram and follow with cardiology consult. Informed Dr. [name]. Continue home metoprolol dose.  *Alcohol abuse and withdrawal I have ordered CIWA protocol and Ativan to be given every 2 hours for his withdrawal symptoms. IV fluids and vitamin replacement as per protocol.  *Hypokalemia Replace oral.  Check magnesium.  *Hypertension Continue clonidine.  *Myasthenia gravis Continue CellCept.  *Active smoking Counseled to quit smoking for 4 minutes and offered nicotine patch.  All the records are reviewed and case discussed with ED provider. Management plans  discussed with the patient, family and they are in agreement.  CODE STATUS: Full code.    Code Status Orders  (From admission, onward)         Start     Ordered   06/24/18 1438  Full code  Continuous     06/24/18 1438        Code Status History    Date Active Date Inactive Code Status Order ID Comments User Context   06/24/2018 1207 06/24/2018 1438 Full Code [id number]  Arnaldo Natal, MD ED   06/12/2018 0350 06/13/2018 0101 Full Code [id number]  Darci Current, MD ED   05/25/2018 1609 05/30/2018 1913 Full Code [id number]  Luciano Cutter, DO Inpatient   05/17/2018 0121 05/21/2018 1635 Full Code [id number]  Mayo, Allyn Kenner, MD Inpatient   04/15/2018 1145 04/19/2018 2132 Full Code [id number]  Delfino Lovett, MD Inpatient   04/03/2018 0547 04/10/2018 2052 Full Code [id number]  Barbaraann Rondo, MD ED   02/04/2018 1528 02/07/2018 1728 Full Code [id number]  Mayo, Allyn Kenner, MD ED   02/03/2018 2233 02/04/2018 0841 Full Code [id number]  Jene Every, MD ED   01/20/2018 0140 01/22/2018 1819 Full Code [id number]  Barbaraann Rondo, MD ED   01/19/2018 1239 01/20/2018 0140 Full Code [id number]  Myrna Blazer, MD ED   12/22/2017 0841 12/25/2017 2031 Full Code [id number]  Barbaraann Rondo, MD Inpatient   10/27/2017  1522 11/02/2017 1432 Full Code [id number]  Milagros Loll, MD ED   10/21/2017 1945 10/23/2017 1833 Full Code [id number]  Auburn Bilberry, MD Inpatient   10/21/2017 1504 10/21/2017 1945 Full Code [id number]  Arnaldo Natal, MD ED   10/10/2017 1314 10/11/2017 1147 Full Code [id number]  Willy Eddy, MD ED   10/05/2017 2214 10/07/2017 2101 Full Code [id number]  Arnaldo Natal, MD ED   10/05/2017 1924 10/05/2017 2214 Full Code [id number]  Arnaldo Natal, MD ED   10/01/2017 0807 10/04/2017 1650 Full Code [id number]  Barbaraann Rondo, MD Inpatient   08/24/2017 1756 08/27/2017 1849 Full Code [id number]  Enid Baas, MD ED   08/14/2017 0030 08/18/2017 1605 Full Code [id number]  Oralia Manis, MD Inpatient    06/04/2017 0116 06/06/2017 1701 Full Code [id number]  Oralia Manis, MD Inpatient   03/19/2017 1559 03/20/2017 2128 Full Code [id number]  Arnaldo Natal, MD ED   02/25/2017 1421 03/01/2017 1829 Full Code [id number]  Houston Siren, MD Inpatient   11/12/2016 1533 11/14/2016 1425 Full Code [id number]  Marguarite Arbour, MD Inpatient   12/26/2015 1927 12/30/2015 1604 Full Code [id number]  Marguarite Arbour, MD Inpatient       TOTAL TIME TAKING CARE OF THIS PATIENT: 50 minutes.    Altamese Dilling M.D on 06/24/2018   Between 7am to 6pm - Pager - [phone]  After 6pm go to [email or web address] - password [email or web address]  [facility]  Office  [phone]  CC: Primary care physician; [name], MD   Note: This dictation was prepared with Dragon dictation along with smaller phrase technology. Any transcriptional errors that result from this process are unintentional.

## 2018-06-24 NOTE — ED Notes (Signed)
Admitting MD in room to assess patient.  Will continue to monitor.   

## 2018-06-24 NOTE — BH Assessment (Addendum)
TTS spoke with pt who is requesting detox treatment. Pt reports "I drank 20-30 of those little airplane alcohol bottles". He reports he has been to 3 rehabs in the, last treatment being 2 months ago. He currently lives with his parents who are concerned with his alcohol use. Pt reports current withdrawal symptoms: shakes. Pt's BAL was 168 on admission to ED.  Per Dr. Darnelle Catalan, pt will be admitted medical.  Detox referral canceled.

## 2018-06-24 NOTE — ED Triage Notes (Signed)
Pt to ED via [facility] from home c/o alcohol intoxication. Mother called and stated that pt was severely intoxicated. Pt hx of alcoholism. VSS with EMS. Pt covered in fecal matter.

## 2018-06-24 NOTE — ED Provider Notes (Addendum)
[facility] Emergency Department Provider Note   ____________________________________________   First MD Initiated Contact with Patient 06/24/18 1157     (approximate)  I have reviewed the triage vital signs and the nursing notes.   HISTORY  Chief Complaint Alcohol Intoxication    HPI Kurt Horton is a 58 y.o. male who reports he is retired Best [employer].  He has had 20 little airplane bottles of liquor this morning.  From what I understand his mother called because she was worried he had alcohol poisoning.  Patient is awake alert oriented able to speak clearly.  He has says he is an alcoholic.   Past Medical History:  Diagnosis Date  . A-fib (HCC)   . Alcohol abuse   . Alcohol withdrawal (HCC) 11/12/2016  . Diabetes mellitus without complication (HCC)   . DJD (degenerative joint disease)   . Hypertension   . Myasthenia gravis (HCC)   . Renal disorder     Patient Active Problem List   Diagnosis Date Noted  . Tobacco use disorder 05/27/2018  . GI bleed 02/04/2018  . Alcohol intoxication (HCC) 12/22/2017  . Malnutrition of moderate degree 11-18-2017  . Chronic combined systolic and diastolic CHF (congestive heart failure) (HCC) 08/13/2017  . Alcohol withdrawal delirium (HCC) 02/26/2017  . Atrial fibrillation with RVR (HCC) 02/25/2017  . Acute alcoholic intoxication with complication (HCC)   . Alcohol use disorder, severe, dependence (HCC)   . Substance induced mood disorder (HCC) 11/13/2016  . SVT (supraventricular tachycardia) (HCC) 11/12/2016  . HTN (hypertension) 11/12/2016  . EtOH dependence (HCC) 11/12/2016  . Lumbar radiculopathy 08/24/2016  . Major depressive disorder, recurrent severe without psychotic features (HCC) 08/24/2016  . Chronic low back pain 01/04/2016  . Pressure ulcer 12/29/2015  . Osteomyelitis (HCC) 12/26/2015  . PVD (peripheral vascular disease) (HCC) 12/26/2015  . Type II diabetes mellitus with manifestations  (HCC) 12/26/2015  . Myasthenia gravis (HCC) 12/26/2015    Past Surgical History:  Procedure Laterality Date  . AMPUTATION TOE Left 12/29/2015   Procedure: AMPUTATION TOE;  Surgeon: Gwyneth Revels, DPM;  Location: [facility] ORS;  Service: Podiatry;  Laterality: Left;  . ESOPHAGOGASTRODUODENOSCOPY N/A 02/05/2018   Procedure: ESOPHAGOGASTRODUODENOSCOPY (EGD);  Surgeon: Pasty Spillers, MD;  Location: [facility] ENDOSCOPY;  Service: Endoscopy;  Laterality: N/A;  . PERIPHERAL VASCULAR CATHETERIZATION N/A 12/27/2015   Procedure: Lower Extremity Angiography;  Surgeon: Annice Needy, MD;  Location: [facility] INVASIVE CV LAB;  Service: Cardiovascular;  Laterality: N/A;  . right hip surgery    . right shoulder surgery      Prior to Admission medications   Medication Sig Start Date End Date Taking? Authorizing Provider  B Complex-C (B-COMPLEX WITH VITAMIN C) tablet Take 1 tablet by mouth daily. 11/02/17   Alford Highland, MD  chlordiazePOXIDE (LIBRIUM) 25 MG capsule Take 1 tablet by mouth 5 times a day on day 1, then decrease by 1 tablet daily until gone. 06/14/18   Emily Filbert, MD  cloNIDine (CATAPRES - DOSED IN MG/24 HR) 0.1 mg/24hr patch Place 1 patch (0.1 mg total) onto the skin once a week. Patient not taking: Reported on 06/12/2018 05/30/18   Pucilowska, Braulio Conte B, MD  gabapentin (NEURONTIN) 300 MG capsule Take 2 capsules (600 mg total) by mouth 3 (three) times daily. 05/30/18   Pucilowska, Braulio Conte B, MD  metFORMIN (GLUCOPHAGE) 500 MG tablet Take 1 tablet (500 mg total) by mouth daily. 05/30/18   Pucilowska, Braulio Conte B, MD  metoprolol succinate (TOPROL-XL) 50 MG  24 hr tablet Take 1 tablet (50 mg total) by mouth daily. Take with or immediately following a meal. 05/30/18   Pucilowska, Jolanta B, MD  mirtazapine (REMERON) 15 MG tablet Take 1 tablet (15 mg total) by mouth at bedtime. Patient not taking: Reported on 06/12/2018 05/30/18   Pucilowska, Ellin GoodieJolanta B, MD  multivitamin (ONE-A-DAY MEN'S) TABS tablet Take 1  tablet by mouth daily. 04/19/18   Shaune Pollackhen, Qing, MD  mycophenolate (CELLCEPT) 500 MG tablet Take 1 tablet (500 mg total) by mouth 2 (two) times daily. Take 1 tablet (500MG ) by mouth every morning and 2 tablets (1000MG ) by mouth every evening 05/30/18   Pucilowska, Jolanta B, MD  senna-docusate (SENOKOT-S) 8.6-50 MG tablet Take 1 tablet by mouth at bedtime as needed for mild constipation. Patient taking differently: Take 1 tablet by mouth daily.  12/25/17   Mayo, Allyn KennerKaty Dodd, MD  testosterone cypionate (DEPOTESTOSTERONE CYPIONATE) 200 MG/ML injection Inject 200 mg into the muscle every 14 (fourteen) days.    [provider]  thiamine 100 MG tablet Take 1 tablet (100 mg total) by mouth daily. 05/30/18   Pucilowska, Braulio ConteJolanta B, MD  traZODone (DESYREL) 100 MG tablet Take 1 tablet (100 mg total) by mouth at bedtime. 05/30/18   Pucilowska, Ellin GoodieJolanta B, MD    Allergies Nsaids and Other  Family History  Problem Relation Age of Onset  . Rheum arthritis Father     Social History Social History   Tobacco Use  . Smoking status: Current Every Day Smoker    Packs/day: 0.50    Types: Cigarettes  . Smokeless tobacco: Never Used  Substance Use Topics  . Alcohol use: Yes    Alcohol/week: 20.0 - 30.0 standard drinks    Types: 20 - 30 Shots of liquor per week    Comment: Pt had 20 airplane bottles today  . Drug use: Yes    Types: Oxycodone    Review of Systems  Constitutional: No fever/chills Eyes: No visual changes. ENT: No sore throat. Cardiovascular: Denies chest pain. Respiratory: Denies shortness of breath. Gastrointestinal: No abdominal pain.  No nausea, no vomiting.  No diarrhea.  No constipation. Genitourinary: Negative for dysuria. Musculoskeletal: Negative for back pain. Skin: Negative for rash. Neurological: Negative for headaches, focal weakness  ____________________________________________   PHYSICAL EXAM:  VITAL SIGNS: ED Triage Vitals  Enc Vitals Group     BP 06/24/18  1145 108/70     Pulse --      Resp 06/24/18 1145 18     Temp 06/24/18 1145 98.1 F (36.7 C)     Temp Source 06/24/18 1145 Oral     SpO2 06/24/18 1145 97 %     Weight 06/24/18 1146 179 lb 10.8 oz (81.5 kg)     Height 06/24/18 1146 5\' 11"  (1.803 m)     Head Circumference --      Peak Flow --      Pain Score 06/24/18 1146 0     Pain Loc --      Pain Edu? --      Excl. in GC? --     Constitutional: Alert and oriented.  Appears to be somewhat intoxicated Eyes: Conjunctivae are normal. PER. EOMI. Head: Atraumatic. Nose: No congestion/rhinnorhea. Mouth/Throat: Mucous membranes are moist.  Oropharynx non-erythematous. Neck: No stridor. Cardiovascular: Normal rate, regular rhythm. Grossly normal heart sounds.  Good peripheral circulation. Respiratory: Normal respiratory effort.  No retractions. Lungs CTAB. Gastrointestinal: Soft and nontender. No distention. No abdominal bruits. No CVA tenderness. Musculoskeletal: No  lower extremity tenderness nor edema.   Neurologic:  Normal speech and language. No gross focal neurologic deficits are appreciated.  Skin:  Skin is warm, dry and intact. No rash noted.   ____________________________________________   LABS (all labs ordered are listed, but only abnormal results are displayed)  Labs Reviewed  CBC WITH DIFFERENTIAL/PLATELET - Abnormal; Notable for the following components:      Result Value   Hemoglobin 12.8 (*)    RDW 20.3 (*)    All other components within normal limits  COMPREHENSIVE METABOLIC PANEL  ACETAMINOPHEN LEVEL  ETHANOL  LIPASE, BLOOD  SALICYLATE LEVEL  URINALYSIS, COMPLETE (UACMP) WITH MICROSCOPIC  URINE DRUG SCREEN, QUALITATIVE ([facility] ONLY)   ____________________________________________  EKG  EKG read interpreted by me shows A. fib with RVR at a rate of 157 left axis no acute ST-T wave changes ____________________________________________  RADIOLOGY  ED MD interpretation:    Official radiology report(s): No  results found.  ____________________________________________   PROCEDURES  Procedure(s) performed:   Procedures  Critical Care performed: Critical care time 1 hour this includes initial evaluation of the patient discovery of the A. fib which I did not hear when I auscultated his chest initial dose of diltiazem which brought his heart rate from 1 5160 down to 05/28/1928 re-dosing him starting him on a drip.  Anticipate getting him in the hospital.  ____________________________________________   INITIAL IMPRESSION / ASSESSMENT AND PLAN / ED COURSE  At this point patient still in A. fib with RVR heart rates in the 1 teens on a diltiazem drip.  His troponin and TSH are within normal limits.  We will get him in the hospital.  Hopefully the A. fib will improve as he becomes less intoxicated.  We are also giving him a banana bag.          ____________________________________________   FINAL CLINICAL IMPRESSION(S) / ED DIAGNOSES  Final diagnoses:  ETOH abuse  Atrial fibrillation with RVR [facility])     ED Discharge Orders    None       Note:  This document was prepared using Dragon voice recognition software and may include unintentional dictation errors.    Arnaldo Natal, MD 06/24/18 0947    Arnaldo Natal, MD 06/24/18 [phone]

## 2018-06-24 NOTE — ED Notes (Signed)
Answered call light cleaned patient and refreshed bed. [place]ingerale given.

## 2018-06-24 NOTE — ED Notes (Signed)
Report to Anna, RN.

## 2018-06-24 NOTE — ED Notes (Signed)
Staff cleaned patient of stool and urine.

## 2018-06-24 NOTE — ED Notes (Signed)
Patient is complaining of feeling very hot and sweating.

## 2018-06-25 ENCOUNTER — Inpatient Hospital Stay
Admit: 2018-06-25 | Discharge: 2018-06-25 | Disposition: A | Discharge status: Home / Self Care | Payer: Medicaid Other | Attending: Internal Medicine | Admitting: Internal Medicine

## 2018-06-25 ENCOUNTER — Encounter: Payer: Self-pay | Admitting: Internal Medicine

## 2018-06-25 LAB — ECHOCARDIOGRAM COMPLETE
Height: 71 in
WEIGHTICAEL: 2874.8 [oz_av]

## 2018-06-25 LAB — GLUCOSE, CAPILLARY
GLUCOSE-CAPILLARY: 140 mg/dL — AB (ref 70–99)
Glucose-Capillary: 128 mg/dL — ABNORMAL HIGH (ref 70–99)
Glucose-Capillary: 135 mg/dL — ABNORMAL HIGH (ref 70–99)
Glucose-Capillary: 151 mg/dL — ABNORMAL HIGH (ref 70–99)

## 2018-06-25 LAB — MAGNESIUM: Magnesium: 1.5 mg/dL — ABNORMAL LOW (ref 1.7–2.4)

## 2018-06-25 LAB — CBC
HEMATOCRIT: 34.8 % — AB (ref 39.0–52.0)
HEMOGLOBIN: 11.2 g/dL — AB (ref 13.0–17.0)
MCH: 27.9 pg (ref 26.0–34.0)
MCHC: 32.2 g/dL (ref 30.0–36.0)
MCV: 86.8 fL (ref 80.0–100.0)
Platelets: 169 10*3/uL (ref 150–400)
RBC: 4.01 MIL/uL — ABNORMAL LOW (ref 4.22–5.81)
RDW: 20.2 % — ABNORMAL HIGH (ref 11.5–15.5)
WBC: 5.4 10*3/uL (ref 4.0–10.5)
nRBC: 0 % (ref 0.0–0.2)

## 2018-06-25 LAB — BASIC METABOLIC PANEL
Anion gap: 6 (ref 5–15)
BUN: 7 mg/dL (ref 6–20)
CO2: 27 mmol/L (ref 22–32)
Calcium: 8.2 mg/dL — ABNORMAL LOW (ref 8.9–10.3)
Chloride: 103 mmol/L (ref 98–111)
Creatinine, Ser: 0.59 mg/dL — ABNORMAL LOW (ref 0.61–1.24)
GFR calc Af Amer: >60 mL/min (ref >60)
GFR calc non Af Amer: >60 mL/min (ref >60)
Glucose, Bld: 131 mg/dL — ABNORMAL HIGH (ref 70–99)
Potassium: 3.3 mmol/L — ABNORMAL LOW (ref 3.5–5.1)
Sodium: 136 mmol/L (ref 135–145)

## 2018-06-25 MED ORDER — DILTIAZEM HCL ER COATED BEADS 120 MG PO CP24
120.0000 mg | ORAL_CAPSULE | Freq: Every day | ORAL | Status: DC
  Administered 2018-06-25 – 2018-06-28 (×4): 120 mg via ORAL
  Filled 2018-06-25 (×4): qty 1

## 2018-06-25 MED ORDER — DILTIAZEM HCL ER COATED BEADS 120 MG PO CP24: 120.0000 mg | ORAL_CAPSULE | Freq: Every day | ORAL | Status: DC

## 2018-06-25 MED ORDER — CHLORDIAZEPOXIDE HCL 25 MG PO CAPS
25.0000 mg | ORAL_CAPSULE | Freq: Three times a day (TID) | ORAL | Status: AC
  Administered 2018-06-25 – 2018-06-26 (×5): 25 mg via ORAL
  Filled 2018-06-25 (×5): qty 1

## 2018-06-25 NOTE — ED Notes (Signed)
Patient in bed resting with eyes closed. Vitals WDL. Will continue to monitor.  

## 2018-06-25 NOTE — Progress Notes (Signed)
*  PRELIMINARY RESULTS* Echocardiogram 2D Echocardiogram has been performed.  Cristela Blue 06/25/2018, 1:05 PM

## 2018-06-25 NOTE — Progress Notes (Signed)
Advance care planning  Purpose of Encounter Afib  Parties in Attendance Patient  Patients Decisional capacity Alert and oriented. Can make medical desicions  Discussed regarding afib, admission, treatment and plan and prognosis  Code status discussed  - FULL CODE  Time spent - 17 minutes

## 2018-06-25 NOTE — ED Notes (Signed)
Bedtime medication given. No complaints at this time from patient. Patient educated on importance to drink fluids.

## 2018-06-25 NOTE — Progress Notes (Signed)
[employer] - [place] at [facility]   PATIENT NAME: Kurt Horton    MR#:  [id number]  DATE OF BIRTH:  [birth year]  SUBJECTIVE:  CHIEF COMPLAINT:   Chief Complaint  Patient presents with  . Alcohol Intoxication   Tremulous but oriented. In NSR today  REVIEW OF SYSTEMS:    Review of Systems  Constitutional: Positive for malaise/fatigue. Negative for chills and fever.  HENT: Negative for sore throat.   Eyes: Negative for blurred vision, double vision and pain.  Respiratory: Negative for cough, hemoptysis, shortness of breath and wheezing.   Cardiovascular: Negative for chest pain, palpitations, orthopnea and leg swelling.  Gastrointestinal: Negative for abdominal pain, constipation, diarrhea, heartburn, nausea and vomiting.  Genitourinary: Negative for dysuria and hematuria.  Musculoskeletal: Negative for back pain and joint pain.  Skin: Negative for rash.  Neurological: Positive for dizziness and tremors. Negative for sensory change, speech change, focal weakness and headaches.  Endo/Heme/Allergies: Does not bruise/bleed easily.  Psychiatric/Behavioral: Negative for depression. The patient is nervous/anxious.     DRUG ALLERGIES:   Allergies  Allergen Reactions  . Nsaids Other (See Comments)    Duodenal ulcers, GI bleeding  . Other Other (See Comments)    Patient states he can't take any "mycin" medications because myasthenia gravis.    VITALS:  Blood pressure (!) 142/75, pulse 92, temperature 98.1 F (36.7 C), temperature source Oral, resp. rate 18, height 5\' 11"  (1.803 m), weight 81.5 kg, SpO2 95 %.  PHYSICAL EXAMINATION:   Physical Exam  GENERAL:  58 y.o.-year-old patient lying in the bed  EYES: Pupils equal, round, reactive to light and accommodation. No scleral icterus. Extraocular muscles intact.  HEENT: Head atraumatic, normocephalic. Oropharynx and nasopharynx clear.  NECK:  Supple, no jugular venous distention. No thyroid enlargement, no  tenderness.  LUNGS: Normal breath sounds bilaterally, no wheezing, rales, rhonchi. No use of accessory muscles of respiration.  CARDIOVASCULAR: S1, S2 normal. No murmurs, rubs, or gallops.  ABDOMEN: Soft, nontender, nondistended. Bowel sounds present. No organomegaly or mass.  EXTREMITIES: No cyanosis, clubbing or edema b/l.    NEUROLOGIC: Cranial nerves II through XII are intact. No focal Motor or sensory deficits b/l.   PSYCHIATRIC: The patient is alert and oriented x 3. Anxious SKIN: No obvious rash, lesion, or ulcer.   LABORATORY PANEL:   CBC Recent Labs  Lab 06/25/18 0745  WBC 5.4  HGB 11.2*  HCT 34.8*  PLT 169   ------------------------------------------------------------------------------------------------------------------ Chemistries  Recent Labs  Lab 06/24/18 1154 06/25/18 0745  NA 139 136  K 3.2* 3.3*  CL 101 103  CO2 24 27  GLUCOSE 143* 131*  BUN 9 7  CREATININE 0.67 0.59*  CALCIUM 8.7* 8.2*  MG 1.6*  --   AST 27  --   ALT 17  --   ALKPHOS 87  --   BILITOT 0.7  --    ------------------------------------------------------------------------------------------------------------------  Cardiac Enzymes Recent Labs  Lab 06/24/18 1154  TROPONINI <0.03   ------------------------------------------------------------------------------------------------------------------  RADIOLOGY:  No results found.   ASSESSMENT AND PLAN:   * A. fib with RVR IN NSR today. Will start cardizem CD 120 mg Wean off drip. Continue Metoprolol  *Alcohol  withdrawal ON CIWA protocol Will add librium He does have chronic resting tremor  *Hypokalemia Replace oral.    *Hypertension Continue clonidine. Metoprolol, cardizem  *Myasthenia gravis Continue CellCept.  *Active smoking Counseled on admission  All the records are reviewed and case discussed with Care Management/Social Worker Management plans discussed with  the patient, family and they are in  agreement.  CODE STATUS: FULL CODE  DVT Prophylaxis: SCDs  TOTAL TIME TAKING CARE OF THIS PATIENT: 35 minutes.   POSSIBLE D/C IN 1-2 DAYS, DEPENDING ON CLINICAL CONDITION.  Molinda Bailiff Latash Nouri M.D on 06/25/2018 at 10:46 AM  Between 7am to 6pm - Pager - [phone]  After 6pm go to [email or web address] - password [email or web address] [facility]  [facility] [place]  Office  [phone]  CC: Primary care physician; [name], MD  Note: This dictation was prepared with Dragon dictation along with smaller phrase technology. Any transcriptional errors that result from this process are unintentional.

## 2018-06-25 NOTE — ED Notes (Signed)
Called 2A to inform that pt is on the way and that I will give bedside report.  Per Cammy Copa RN pt is going to 258 instead, but that room is not clean. Arrived to 66 and gave bedside report to [facility] once she arrived. Pt assisted to bed. Tech at bedside.

## 2018-06-25 NOTE — ED Notes (Signed)
Patient had BM on bedpan and used urinal.

## 2018-06-25 NOTE — ED Notes (Addendum)
Patient's HR NSR 87. Decreasing Cardizem to 12.5 mg/hr from 15 mg/hr to tolerate.  Patient asking for food at this time. Advised patient that breakfast will be soon and try to get some rest.

## 2018-06-25 NOTE — ED Notes (Signed)
Sandwich tray and soda given to patient.  

## 2018-06-25 NOTE — Consult Note (Signed)
Kurt Horton is a 58 y.o. male  [id number]  Primary Cardiologist: [name] Reason for Consultation: Atrial fibrillation with rapid ventricular response rate  HPI: This is a 58 year old white male with a history of EtOH abuse presented to the hospital with weakness dizziness and was found to be in atrial fibrillation with rapid ventricular response rate.  Currently feeling better but still feels weak.   Review of Systems: No chest pain   Past Medical History:  Diagnosis Date  . A-fib (HCC)   . Alcohol abuse   . Alcohol withdrawal (HCC) 11/12/2016  . Diabetes mellitus without complication (HCC)   . DJD (degenerative joint disease)   . Hypertension   . Myasthenia gravis (HCC)   . Renal disorder     (Not in a hospital admission)    . B-complex with vitamin C  1 tablet Oral Daily  . cloNIDine  0.1 mg Transdermal Weekly  . diltiazem  120 mg Oral Daily  . folic acid  1 mg Oral Daily  . gabapentin  600 mg Oral TID  . heparin  5,000 Units Subcutaneous Q8H  . insulin aspart  0-5 Units Subcutaneous QHS  . insulin aspart  0-9 Units Subcutaneous TID WC  . metoprolol succinate  50 mg Oral Daily  . multivitamin with minerals  1 tablet Oral Daily  . mycophenolate  1,000 mg Oral QHS  . mycophenolate  500 mg Oral q morning - 10a  . nicotine  21 mg Transdermal Daily  . potassium chloride  20 mEq Oral BID  . senna-docusate  1 tablet Oral Daily  . thiamine  100 mg Oral Daily   Or  . thiamine  100 mg Intravenous Daily  . thiamine  100 mg Oral Daily  . traZODone  100 mg Oral QHS    Infusions: . sodium chloride 75 mL/hr at 06/25/18 0030  . diltiazem (CARDIZEM) infusion 5 mg/hr (06/25/18 0729)    Allergies  Allergen Reactions  . Nsaids Other (See Comments)    Duodenal ulcers, GI bleeding  . Other Other (See Comments)    Patient states he can't take any "mycin" medications because myasthenia gravis.    Social History   Socioeconomic History  . Marital status:  Divorced    Spouse name: Not on file  . Number of children: Not on file  . Years of education: Not on file  . Highest education level: Not on file  Occupational History  . Not on file  Social Needs  . Financial resource strain: Not on file  . Food insecurity:    Worry: Not on file    Inability: Not on file  . Transportation needs:    Medical: Not on file    Non-medical: Not on file  Tobacco Use  . Smoking status: Current Every Day Smoker    Packs/day: 0.50    Types: Cigarettes  . Smokeless tobacco: Never Used  Substance and Sexual Activity  . Alcohol use: Yes    Alcohol/week: 20.0 - 30.0 standard drinks    Types: 20 - 30 Shots of liquor per week    Comment: Pt had 20 airplane bottles today  . Drug use: Yes    Types: Oxycodone  . Sexual activity: Not on file  Lifestyle  . Physical activity:    Days per week: Not on file    Minutes per session: Not on file  . Stress: Not on file  Relationships  . Social connections:    Talks on phone:  Not on file    Gets together: Not on file    Attends religious service: Not on file    Active member of club or organization: Not on file    Attends meetings of clubs or organizations: Not on file    Relationship status: Not on file  . Intimate partner violence:    Fear of current or ex partner: Not on file    Emotionally abused: Not on file    Physically abused: Not on file    Forced sexual activity: Not on file  Other Topics Concern  . Not on file  Social History Narrative   Live with Parents, ambulates with a cane. Falls lately.    Family History  Problem Relation Age of Onset  . Rheum arthritis Father     PHYSICAL EXAM: Vitals:   06/25/18 0801 06/25/18 0845  BP: 126/83 138/87  Pulse: 87 92  Resp:  11  Temp:    SpO2:  99%     Intake/Output Summary (Last 24 hours) at 06/25/2018 0950 Last data filed at 06/24/2018 1632 Gross per 24 hour  Intake 1002.45 ml  Output 1400 ml  Net -397.55 ml    General:  Well appearing.  No respiratory difficulty HEENT: normal Neck: supple. no JVD. Carotids 2+ bilat; no bruits. No lymphadenopathy or thryomegaly appreciated. Cor: PMI nondisplaced. Regular rate & rhythm. No rubs, gallops or murmurs. Lungs: clear Abdomen: soft, nontender, nondistended. No hepatosplenomegaly. No bruits or masses. Good bowel sounds. Extremities: no cyanosis, clubbing, rash, edema Neuro: alert & oriented x 3, cranial nerves grossly intact. moves all 4 extremities w/o difficulty. Affect pleasant.  ECG: Atrial fibrillation with rapid ventricular response rate  Results for orders placed or performed during the hospital encounter of 06/24/18 (from the past 24 hour(s))  Comprehensive metabolic panel     Status: Abnormal   Collection Time: 06/24/18 11:54 AM  Result Value Ref Range   Sodium 139 135 - 145 mmol/L   Potassium 3.2 (L) 3.5 - 5.1 mmol/L   Chloride 101 98 - 111 mmol/L   CO2 24 22 - 32 mmol/L   Glucose, Bld 143 (H) 70 - 99 mg/dL   BUN 9 6 - 20 mg/dL   Creatinine, Ser 8.25 0.61 - 1.24 mg/dL   Calcium 8.7 (L) 8.9 - 10.3 mg/dL   Total Protein 6.4 (L) 6.5 - 8.1 g/dL   Albumin 3.6 3.5 - 5.0 g/dL   AST 27 15 - 41 U/L   ALT 17 0 - 44 U/L   Alkaline Phosphatase 87 38 - 126 U/L   Total Bilirubin 0.7 0.3 - 1.2 mg/dL   GFR calc non Af Amer >60 >60 mL/min   GFR calc Af Amer >60 >60 mL/min   Anion gap 14 5 - 15  Acetaminophen level     Status: Abnormal   Collection Time: 06/24/18 11:54 AM  Result Value Ref Range   Acetaminophen (Tylenol), Serum <10 (L) 10 - 30 ug/mL  Ethanol     Status: Abnormal   Collection Time: 06/24/18 11:54 AM  Result Value Ref Range   Alcohol, Ethyl (B) 168 (H) <10 mg/dL  Lipase, blood     Status: None   Collection Time: 06/24/18 11:54 AM  Result Value Ref Range   Lipase 42 11 - 51 U/L  Salicylate level     Status: None   Collection Time: 06/24/18 11:54 AM  Result Value Ref Range   Salicylate Lvl <7.0 2.8 - 30.0 mg/dL  CBC with Differential  Status: Abnormal    Collection Time: 06/24/18 11:54 AM  Result Value Ref Range   WBC 7.0 4.0 - 10.5 K/uL   RBC 4.69 4.22 - 5.81 MIL/uL   Hemoglobin 12.8 (L) 13.0 - 17.0 g/dL   HCT 16.139.6 09.639.0 - 04.552.0 %   MCV 84.4 80.0 - 100.0 fL   MCH 27.3 26.0 - 34.0 pg   MCHC 32.3 30.0 - 36.0 g/dL   RDW 40.920.3 (H) 81.111.5 - 91.415.5 %   Platelets 227 150 - 400 K/uL   nRBC 0.0 0.0 - 0.2 %   Neutrophils Relative % 67 %   Neutro Abs 4.7 1.7 - 7.7 K/uL   Lymphocytes Relative 23 %   Lymphs Abs 1.6 0.7 - 4.0 K/uL   Monocytes Relative 8 %   Monocytes Absolute 0.6 0.1 - 1.0 K/uL   Eosinophils Relative 1 %   Eosinophils Absolute 0.0 0.0 - 0.5 K/uL   Basophils Relative 1 %   Basophils Absolute 0.1 0.0 - 0.1 K/uL   Immature Granulocytes 0 %   Abs Immature Granulocytes 0.02 0.00 - 0.07 K/uL  Troponin I - Add-On to previous collection     Status: None   Collection Time: 06/24/18 11:54 AM  Result Value Ref Range   Troponin I <0.03 <0.03 ng/mL  TSH     Status: None   Collection Time: 06/24/18 11:54 AM  Result Value Ref Range   TSH 0.865 0.350 - 4.500 uIU/mL  Magnesium     Status: Abnormal   Collection Time: 06/24/18 11:54 AM  Result Value Ref Range   Magnesium 1.6 (L) 1.7 - 2.4 mg/dL  Urinalysis, Complete w Microscopic     Status: Abnormal   Collection Time: 06/24/18  4:19 PM  Result Value Ref Range   Color, Urine YELLOW (A) YELLOW   APPearance CLEAR (A) CLEAR   Specific Gravity, Urine 1.016 1.005 - 1.030   pH 6.0 5.0 - 8.0   Glucose, UA NEGATIVE NEGATIVE mg/dL   Hgb urine dipstick NEGATIVE NEGATIVE   Bilirubin Urine NEGATIVE NEGATIVE   Ketones, ur NEGATIVE NEGATIVE mg/dL   Protein, ur NEGATIVE NEGATIVE mg/dL   Nitrite NEGATIVE NEGATIVE   Leukocytes,Ua NEGATIVE NEGATIVE   RBC / HPF 0-5 0 - 5 RBC/hpf   WBC, UA 0-5 0 - 5 WBC/hpf   Bacteria, UA NONE SEEN NONE SEEN   Squamous Epithelial / LPF NONE SEEN 0 - 5   Mucus PRESENT   Urine Drug Screen, Qualitative     Status: Abnormal   Collection Time: 06/24/18  4:19 PM  Result  Value Ref Range   Tricyclic, Ur Screen NONE DETECTED NONE DETECTED   Amphetamines, Ur Screen NONE DETECTED NONE DETECTED   MDMA (Ecstasy)Ur Screen NONE DETECTED NONE DETECTED   Cocaine Metabolite,Ur [place] NONE DETECTED NONE DETECTED   Opiate, Ur Screen NONE DETECTED NONE DETECTED   Phencyclidine (PCP) Ur S NONE DETECTED NONE DETECTED   Cannabinoid 50 Ng, Ur [place] NONE DETECTED NONE DETECTED   Barbiturates, Ur Screen NONE DETECTED NONE DETECTED   Benzodiazepine, Ur Scrn POSITIVE (A) NONE DETECTED   Methadone Scn, Ur NONE DETECTED NONE DETECTED  Glucose, capillary     Status: Abnormal   Collection Time: 06/24/18  4:50 PM  Result Value Ref Range   Glucose-Capillary 182 (H) 70 - 99 mg/dL  Glucose, capillary     Status: Abnormal   Collection Time: 06/24/18 10:42 PM  Result Value Ref Range   Glucose-Capillary 156 (H) 70 - 99 mg/dL  Basic  metabolic panel     Status: Abnormal   Collection Time: 06/25/18  7:45 AM  Result Value Ref Range   Sodium 136 135 - 145 mmol/L   Potassium 3.3 (L) 3.5 - 5.1 mmol/L   Chloride 103 98 - 111 mmol/L   CO2 27 22 - 32 mmol/L   Glucose, Bld 131 (H) 70 - 99 mg/dL   BUN 7 6 - 20 mg/dL   Creatinine, Ser 8.29 (L) 0.61 - 1.24 mg/dL   Calcium 8.2 (L) 8.9 - 10.3 mg/dL   GFR calc non Af Amer >60 >60 mL/min   GFR calc Af Amer >60 >60 mL/min   Anion gap 6 5 - 15  CBC     Status: Abnormal   Collection Time: 06/25/18  7:45 AM  Result Value Ref Range   WBC 5.4 4.0 - 10.5 K/uL   RBC 4.01 (L) 4.22 - 5.81 MIL/uL   Hemoglobin 11.2 (L) 13.0 - 17.0 g/dL   HCT 56.2 (L) 13.0 - 86.5 %   MCV 86.8 80.0 - 100.0 fL   MCH 27.9 26.0 - 34.0 pg   MCHC 32.2 30.0 - 36.0 g/dL   RDW 78.4 (H) 69.6 - 29.5 %   Platelets 169 150 - 400 K/uL   nRBC 0.0 0.0 - 0.2 %  Glucose, capillary     Status: Abnormal   Collection Time: 06/25/18  7:47 AM  Result Value Ref Range   Glucose-Capillary 128 (H) 70 - 99 mg/dL   No results found.   ASSESSMENT AND PLAN: Atrial fibrillation with rapid  ventricular response rate and hypokalemia due to EtOH abuse.  On Cardizem drip patient has converted to sinus rhythm.  Patient can be switched to p.o. Cardizem CD 120 and can be discharged with follow-up in the office on Thursday at 10:00.  KHAN,SHAUKAT A

## 2018-06-25 NOTE — ED Notes (Signed)
Spoke with Admitting doctor about decreasing Cardizem drip to 10mg /hr from 12.5mg /hr due to heart rate in the lower 80's.

## 2018-06-25 NOTE — ED Notes (Signed)
Pt given crackers and sprite.

## 2018-06-25 NOTE — ED Notes (Signed)
Pt given breakfast tray

## 2018-06-25 NOTE — ED Notes (Signed)
Called 2A to inform them that I am on the way up for bedside report. Informed that pt is being moved to 258 and that it isn't clean. Already on floor by this time. Took pt to room 233. Went to room 233. Abigail RN arrived at 27 and I gave bedside report to Allen.

## 2018-06-25 NOTE — ED Notes (Signed)
Attempted to call report, per 2A they didn't know they were getting pt, RN Charge at lunch and they will go get her for report. Informed Teacher, early years/pre of situation.

## 2018-06-25 NOTE — ED Notes (Signed)
Patient in bed watching TV. No complaints at this time. Will continue to monitor.

## 2018-06-26 ENCOUNTER — Telehealth: Payer: Self-pay

## 2018-06-26 ENCOUNTER — Ambulatory Visit: Payer: Self-pay | Admitting: Student in an Organized Health Care Education/Training Program

## 2018-06-26 LAB — GLUCOSE, CAPILLARY
GLUCOSE-CAPILLARY: 125 mg/dL — AB (ref 70–99)
Glucose-Capillary: 109 mg/dL — ABNORMAL HIGH (ref 70–99)
Glucose-Capillary: 119 mg/dL — ABNORMAL HIGH (ref 70–99)
Glucose-Capillary: 117 mg/dL — ABNORMAL HIGH (ref 70–99)

## 2018-06-26 LAB — COMPREHENSIVE METABOLIC PANEL
ALBUMIN: 3 g/dL — AB (ref 3.5–5.0)
ALT: 12 U/L (ref 0–44)
AST: 25 U/L (ref 15–41)
Alkaline Phosphatase: 75 U/L (ref 38–126)
Anion gap: 7 (ref 5–15)
BUN: 10 mg/dL (ref 6–20)
CO2: 24 mmol/L (ref 22–32)
Calcium: 8.3 mg/dL — ABNORMAL LOW (ref 8.9–10.3)
Chloride: 107 mmol/L (ref 98–111)
Creatinine, Ser: 0.69 mg/dL (ref 0.61–1.24)
GFR calc Af Amer: >60 mL/min (ref >60)
GFR calc non Af Amer: >60 mL/min (ref >60)
Glucose, Bld: 131 mg/dL — ABNORMAL HIGH (ref 70–99)
POTASSIUM: 3.9 mmol/L (ref 3.5–5.1)
Sodium: 138 mmol/L (ref 135–145)
Total Bilirubin: 0.5 mg/dL (ref 0.3–1.2)
Total Protein: 5.3 g/dL — ABNORMAL LOW (ref 6.5–8.1)

## 2018-06-26 MED ORDER — METOPROLOL SUCCINATE ER 100 MG PO TB24
100.0000 mg | ORAL_TABLET | Freq: Every day | ORAL | Status: DC
  Administered 2018-06-26 – 2018-06-28 (×3): 100 mg via ORAL
  Filled 2018-06-26 (×3): qty 1

## 2018-06-26 MED ORDER — LORAZEPAM 1 MG PO TABS
1.0000 mg | ORAL_TABLET | ORAL | Status: DC | PRN
  Administered 2018-06-27 – 2018-06-28 (×3): 1 mg via ORAL
  Filled 2018-06-26 (×3): qty 1

## 2018-06-26 NOTE — Progress Notes (Signed)
Patient has no acute event overnight, PRN Ativan given twice, Patient reamined in NSR with HR in the 90s, stable VS,

## 2018-06-26 NOTE — Progress Notes (Signed)
   06/26/18 1800  Clinical Encounter Type  Visited With Patient  Visit Type Follow-up  Referral From Nurse  Spiritual Encounters  Spiritual Needs Prayer;Emotional  Stress Factors  Patient Stress Factors Family relationships;Health changes;Loss of control;Major life changes  Ch received an OR requesting a prayer. Pt shared his strained relationship with his mother who is trying to put him in a rehab. Pt is an alcoholic and on disability. Pt wishes to live independently but does not have the means to do it yet and is stressed out about having to go to a rehab. Pt experienced a painful divorce a few years ago. Pt lacks social support and feels alone. Ch offered a listening ear and a prayer of healing and resilience.

## 2018-06-26 NOTE — Progress Notes (Signed)
SUBJECTIVE: Pt is feeling well today. No chest pain or shortness of breath.   Vitals:   06/26/18 0315 06/26/18 0409 06/26/18 0538 06/26/18 0842  BP: 113/77 118/64  (!) 138/97  Pulse: 93 95 (!) 107 95  Resp:  16  20  Temp: 98.8 F (37.1 C) 98.2 F (36.8 C)  98.3 F (36.8 C)  TempSrc: Oral Oral  Oral  SpO2: 97% 97%  98%  Weight:  85.5 kg    Height:        Intake/Output Summary (Last 24 hours) at 06/26/2018 0847 Last data filed at 06/26/2018 3007 Gross per 24 hour  Intake 2401.36 ml  Output 1375 ml  Net 1026.36 ml    LABS: Basic Metabolic Panel: Recent Labs    06/24/18 1154 06/25/18 0745 06/26/18 0534  NA 139 136 138  K 3.2* 3.3* 3.9  CL 101 103 107  CO2 24 27 24   GLUCOSE 143* 131* 131*  BUN 9 7 10   CREATININE 0.67 0.59* 0.69  CALCIUM 8.7* 8.2* 8.3*  MG 1.6* 1.5*  --    Liver Function Tests: Recent Labs    06/24/18 1154 06/26/18 0534  AST 27 25  ALT 17 12  ALKPHOS 87 75  BILITOT 0.7 0.5  PROT 6.4* 5.3*  ALBUMIN 3.6 3.0*   Recent Labs    06/24/18 1154  LIPASE 42   CBC: Recent Labs    06/24/18 1154 06/25/18 0745  WBC 7.0 5.4  NEUTROABS 4.7  --   HGB 12.8* 11.2*  HCT 39.6 34.8*  MCV 84.4 86.8  PLT 227 169   Cardiac Enzymes: Recent Labs    06/24/18 1154  TROPONINI <0.03   BNP: Invalid input(s): POCBNP D-Dimer: No results for input(s): DDIMER in the last 72 hours. Hemoglobin A1C: No results for input(s): HGBA1C in the last 72 hours. Fasting Lipid Panel: No results for input(s): CHOL, HDL, LDLCALC, TRIG, CHOLHDL, LDLDIRECT in the last 72 hours. Thyroid Function Tests: Recent Labs    06/24/18 1154  TSH 0.865   Anemia Panel: No results for input(s): VITAMINB12, FOLATE, FERRITIN, TIBC, IRON, RETICCTPCT in the last 72 hours.   PHYSICAL EXAM General: Well developed, well nourished, in no acute distress HEENT:  Normocephalic and atramatic Neck:  No JVD.  Lungs: Clear bilaterally to auscultation and percussion. Heart: HRRR . Normal S1  and S2 without gallops or murmurs.  Abdomen: Bowel sounds are positive, abdomen soft and non-tender  Msk:  Back normal, normal gait. Normal strength and tone for age. Extremities: No clubbing, cyanosis or edema.   Neuro: Alert and oriented X 3. Psych:  Good affect, responds appropriately  TELEMETRY: Sinus tachycardia 103bpm  ASSESSMENT AND PLAN:  Atrial fibrillation with RVR: Pt converted to NSR with Cardizem drip.Off drip and now taking Cardizem 120mg /day PO and metoprolol. Rate is elevated this morning. Will increase metoprolol Echo: Normal LVEF 60-65%, moderate LV hypertrophy, trivial MR and aortic insufficiency.   Continue PO Cardizem, increase metoprolol to 100mg /day, anticoagulation contraindicated due to ETOH abuse. May discharge from cardiac perspective and follow up outpatient with cardiology Friday 10am at [facility].   Active Problems:   Atrial fibrillation with RVR (HCC)   Alcohol use disorder, severe, dependence (HCC)   Alcohol withdrawal delirium (HCC)    Caroleen Hamman, NP-C 06/26/2018 8:47 AM Cell: [phone]

## 2018-06-26 NOTE — Progress Notes (Signed)
[employer] - [place] at [facility]   PATIENT NAME: Guadlupe Stoltzfus    MR#:  [id number]  DATE OF BIRTH:  November 30, 1960  SUBJECTIVE:  CHIEF COMPLAINT:   Chief Complaint  Patient presents with  . Alcohol Intoxication   Tremulous but oriented. In NSR today  REVIEW OF SYSTEMS:    Review of Systems  Constitutional: Positive for malaise/fatigue. Negative for chills and fever.  HENT: Negative for sore throat.   Eyes: Negative for blurred vision, double vision and pain.  Respiratory: Negative for cough, hemoptysis, shortness of breath and wheezing.   Cardiovascular: Negative for chest pain, palpitations, orthopnea and leg swelling.  Gastrointestinal: Negative for abdominal pain, constipation, diarrhea, heartburn, nausea and vomiting.  Genitourinary: Negative for dysuria and hematuria.  Musculoskeletal: Negative for back pain and joint pain.  Skin: Negative for rash.  Neurological: Positive for tremors. Negative for dizziness, sensory change, speech change, focal weakness and headaches.  Endo/Heme/Allergies: Does not bruise/bleed easily.  Psychiatric/Behavioral: Negative for depression. The patient is nervous/anxious.     DRUG ALLERGIES:   Allergies  Allergen Reactions  . Nsaids Other (See Comments)    Duodenal ulcers, GI bleeding  . Other Other (See Comments)    Patient states he can't take any "mycin" medications because myasthenia gravis.    VITALS:  Blood pressure 115/71, pulse 83, temperature 98.3 F (36.8 C), temperature source Oral, resp. rate 16, height 5\' 11"  (1.803 m), weight 85.5 kg, SpO2 99 %.  PHYSICAL EXAMINATION:   Physical Exam  GENERAL:  58 y.o.-year-old patient lying in the bed  EYES: Pupils equal, round, reactive to light and accommodation. No scleral icterus. Extraocular muscles intact.  HEENT: Head atraumatic, normocephalic. Oropharynx and nasopharynx clear.  NECK:  Supple, no jugular venous distention. No thyroid enlargement, no tenderness.   LUNGS: Normal breath sounds bilaterally, no wheezing, rales, rhonchi. No use of accessory muscles of respiration.  CARDIOVASCULAR: S1, S2 normal. No murmurs, rubs, or gallops.  ABDOMEN: Soft, nontender, nondistended. Bowel sounds present. No organomegaly or mass.  EXTREMITIES: No cyanosis, clubbing or edema b/l.    NEUROLOGIC: Cranial nerves II through XII are intact. No focal Motor or sensory deficits b/l.  Have some tremors. PSYCHIATRIC: The patient is alert and oriented x 3. Anxious SKIN: No obvious rash, lesion, or ulcer.   LABORATORY PANEL:   CBC Recent Labs  Lab 06/25/18 0745  WBC 5.4  HGB 11.2*  HCT 34.8*  PLT 169   ------------------------------------------------------------------------------------------------------------------ Chemistries  Recent Labs  Lab 06/25/18 0745 06/26/18 0534  NA 136 138  K 3.3* 3.9  CL 103 107  CO2 27 24  GLUCOSE 131* 131*  BUN 7 10  CREATININE 0.59* 0.69  CALCIUM 8.2* 8.3*  MG 1.5*  --   AST  --  25  ALT  --  12  ALKPHOS  --  75  BILITOT  --  0.5   ------------------------------------------------------------------------------------------------------------------  Cardiac Enzymes Recent Labs  Lab 06/24/18 1154  TROPONINI <0.03   ------------------------------------------------------------------------------------------------------------------  RADIOLOGY:  No results found.   ASSESSMENT AND PLAN:   * A. fib with RVR IN NSR today.  cardizem CD 120 mg Continue Metoprolol   Appreciated cardiology help.  *Alcohol  withdrawal ON CIWA protocol  added librium He does have chronic resting tremor  Added oral ativan PRN for Anxiety.  *Hypokalemia Replace oral.    *Hypertension Continue clonidine. Metoprolol, cardizem  *Myasthenia gravis Continue CellCept.  *Active smoking Counseled on admission  All the records are reviewed and case discussed  with Care Management/Social Worker Management plans discussed with  the patient, family and they are in agreement.  CODE STATUS: FULL CODE  DVT Prophylaxis: SCDs  TOTAL TIME TAKING CARE OF THIS PATIENT: 35 minutes.   POSSIBLE D/C IN 1-2 DAYS, DEPENDING ON CLINICAL CONDITION.  Altamese Dilling M.D on 06/26/2018 at 8:48 PM  Between 7am to 6pm - Pager - [phone]  After 6pm go to [email or web address] - password [email or web address] [facility]  [facility] [place] [facility]  Office  [phone]  CC: Primary care physician; [name], MD  Note: This dictation was prepared with Dragon dictation along with smaller phrase technology. Any transcriptional errors that result from this process are unintentional.

## 2018-06-27 LAB — GLUCOSE, CAPILLARY
Glucose-Capillary: 132 mg/dL — ABNORMAL HIGH (ref 70–99)
Glucose-Capillary: 133 mg/dL — ABNORMAL HIGH (ref 70–99)
Glucose-Capillary: 141 mg/dL — ABNORMAL HIGH (ref 70–99)
Glucose-Capillary: 114 mg/dL — ABNORMAL HIGH (ref 70–99)

## 2018-06-27 MED ORDER — SODIUM CHLORIDE 0.9% FLUSH
3.0000 mL | Freq: Two times a day (BID) | INTRAVENOUS | Status: DC
  Administered 2018-06-27 – 2018-06-28 (×2): 3 mL via INTRAVENOUS

## 2018-06-27 NOTE — Evaluation (Signed)
Physical Therapy Evaluation Patient Details Name: Kurt Horton MRN: [id number] DOB: 12/08/1960 Today's Date: 06/27/2018   History of Present Illness  58 y/o M admitted with Afib, ETOH withdrawl.  Pt has had frequent admissions at this facility for alcohol detox in the past year (>10). PMH includes chronic pain, myasthenia gravis, a-fib.   Clinical Impression  Pt showed reasonable effort with PT and actually did tolerate gait training of 236ft with slow, but relatively consistent cadence, no overt LOBs, heavy reliance on the walker and occasional veering to the right that he was not quick to self correct, requiring VCs.  Pt fatigued with the effort, but HR and O2 both stayed in the 90s t/o the effort.  Pt with general weakness for age, but is WFL apart from mild tremor.  He is not interested in HHPT, he should be able to return home with assist from parents, encouraged him to use his walker initially secondary to being weaker than his baseline.     Follow Up Recommendations No PT follow up;Home health PT(pt reports he has not had HHPT in the past, not interested)    Equipment Recommendations       Recommendations for Other Services       Precautions / Restrictions Precautions Precautions: Fall Restrictions Weight Bearing Restrictions: No      Mobility  Bed Mobility Overal bed mobility: Modified Independent             General bed mobility comments: Pt able to get up to sitting w/o much extra effort  Transfers Overall transfer level: Independent Equipment used: Rolling walker (2 wheeled)             General transfer comment: unable to rise w/o assist until PT cued him to use arm rails to rise, able to to stand w/o assist there after  Ambulation/Gait Ambulation/Gait assistance: Min guard Gait Distance (Feet): 200 Feet Assistive device: Rolling walker (2 wheeled)       General Gait Details: Pt was able to slowly, but relatively safely, circumambulate the  nurses' station.  He had some issues with veering L and reported fatiguing in UEs (walker) with the effort but HR remained <100 and O2 in the 90s.   Stairs            Wheelchair Mobility    Modified Rankin (Stroke Patients Only)       Balance Overall balance assessment: Needs assistance Sitting-balance support: No upper extremity supported Sitting balance-Leahy Scale: Good     Standing balance support: Bilateral upper extremity supported Standing balance-Leahy Scale: Fair Standing balance comment: Pt reliant on walker, would clearly have been unsteady w/o UE use                             Pertinent Vitals/Pain Pain Assessment: (unrated chronic back pain)    Home Living Family/patient expects to be discharged to:: Private residence Living Arrangements: Parent Available Help at Discharge: Family;Available 24 hours/day Type of Home: House Home Access: Level entry     Home Layout: One level Home Equipment: Walker - 2 wheels;Cane - single point      Prior Function Level of Independence: Independent with assistive device(s)         Comments: Ambulates with SPC at all times.  Frequent falls when intoxicated, does report some falling when not intoxicated.  Pt ind with bathing, dressing.  Does not drive, says he uses [employer] when he leaves the house.  Pt and family order in and do not do much cooking.      Hand Dominance        Extremity/Trunk Assessment   Upper Extremity Assessment Upper Extremity Assessment: Generalized weakness    Lower Extremity Assessment Lower Extremity Assessment: Generalized weakness       Communication   Communication: No difficulties  Cognition Arousal/Alertness: Awake/alert Behavior During Therapy: WFL for tasks assessed/performed Overall Cognitive Status: Within Functional Limits for tasks assessed                                        General Comments      Exercises     Assessment/Plan     PT Assessment Patient needs continued PT services  PT Problem List Decreased strength;Decreased range of motion;Decreased activity tolerance;Decreased balance;Decreased mobility;Decreased knowledge of use of DME;Decreased safety awareness;Pain;Cardiopulmonary status limiting activity       PT Treatment Interventions DME instruction;Gait training;Stair training;Functional mobility training;Therapeutic activities;Therapeutic exercise;Balance training;Neuromuscular re-education;Patient/family education    PT Goals (Current goals can be found in the Care Plan section)  Acute Rehab PT Goals Patient Stated Goal: go home and stop coming back to the hospital PT Goal Formulation: With patient Potential to Achieve Goals: Fair    Frequency Min 2X/week   Barriers to discharge        Co-evaluation               AM-PAC PT "6 Clicks" Mobility  Outcome Measure Help needed turning from your back to your side while in a flat bed without using bedrails?: None Help needed moving from lying on your back to sitting on the side of a flat bed without using bedrails?: None Help needed moving to and from a bed to a chair (including a wheelchair)?: A Little Help needed standing up from a chair using your arms (e.g., wheelchair or bedside chair)?: A Little Help needed to walk in hospital room?: A Little Help needed climbing 3-5 steps with a railing? : A Lot 6 Click Score: 19    End of Session Equipment Utilized During Treatment: Gait belt Activity Tolerance: Patient limited by fatigue Patient left: with chair alarm set;with call bell/phone within reach Nurse Communication: Mobility status PT Visit Diagnosis: Muscle weakness (generalized) (M62.81);Unsteadiness on feet (R26.81)    Time: 1400-1436 PT Time Calculation (min) (ACUTE ONLY): 36 min   Charges:   PT Evaluation $PT Eval Low Complexity: 1 Low PT Treatments $Gait Training: 8-22 mins        Malachi Pro, DPT 06/27/2018, 3:08  PM

## 2018-06-27 NOTE — Progress Notes (Signed)
[employer] - [place] at [facility]   PATIENT NAME: Kurt Horton    MR#:  [id number]  DATE OF BIRTH:  [birth year]  SUBJECTIVE:  CHIEF COMPLAINT:   Chief Complaint  Patient presents with  . Alcohol Intoxication   Tremulous but oriented. In NSR today. C/o generalized weakness when I saw in morning.  REVIEW OF SYSTEMS:    Review of Systems  Constitutional: Positive for malaise/fatigue. Negative for chills and fever.  HENT: Negative for sore throat.   Eyes: Negative for blurred vision, double vision and pain.  Respiratory: Negative for cough, hemoptysis, shortness of breath and wheezing.   Cardiovascular: Negative for chest pain, palpitations, orthopnea and leg swelling.  Gastrointestinal: Negative for abdominal pain, constipation, diarrhea, heartburn, nausea and vomiting.  Genitourinary: Negative for dysuria and hematuria.  Musculoskeletal: Negative for back pain and joint pain.  Skin: Negative for rash.  Neurological: Positive for tremors. Negative for dizziness, sensory change, speech change, focal weakness and headaches.  Endo/Heme/Allergies: Does not bruise/bleed easily.  Psychiatric/Behavioral: Negative for depression. The patient is nervous/anxious.     DRUG ALLERGIES:   Allergies  Allergen Reactions  . Nsaids Other (See Comments)    Duodenal ulcers, GI bleeding  . Other Other (See Comments)    Patient states he can't take any "mycin" medications because myasthenia gravis.    VITALS:  Blood pressure 109/65, pulse 80, temperature 98.3 F (36.8 C), temperature source Oral, resp. rate 18, height 5\' 11"  (1.803 m), weight 85.8 kg, SpO2 94 %.  PHYSICAL EXAMINATION:   Physical Exam  GENERAL:  58 y.o.-year-old patient lying in the bed  EYES: Pupils equal, round, reactive to light and accommodation. No scleral icterus. Extraocular muscles intact.  HEENT: Head atraumatic, normocephalic. Oropharynx and nasopharynx clear.  NECK:  Supple, no jugular venous  distention. No thyroid enlargement, no tenderness.  LUNGS: Normal breath sounds bilaterally, no wheezing, rales, rhonchi. No use of accessory muscles of respiration.  CARDIOVASCULAR: S1, S2 normal. No murmurs, rubs, or gallops.  ABDOMEN: Soft, nontender, nondistended. Bowel sounds present. No organomegaly or mass.  EXTREMITIES: No cyanosis, clubbing or edema b/l.    NEUROLOGIC: Cranial nerves II through XII are intact. No focal Motor or sensory deficits b/l.  Have some tremors. PSYCHIATRIC: The patient is alert and oriented x 3. Anxious SKIN: No obvious rash, lesion, or ulcer.   LABORATORY PANEL:   CBC Recent Labs  Lab 06/25/18 0745  WBC 5.4  HGB 11.2*  HCT 34.8*  PLT 169   ------------------------------------------------------------------------------------------------------------------ Chemistries  Recent Labs  Lab 06/25/18 0745 06/26/18 0534  NA 136 138  K 3.3* 3.9  CL 103 107  CO2 27 24  GLUCOSE 131* 131*  BUN 7 10  CREATININE 0.59* 0.69  CALCIUM 8.2* 8.3*  MG 1.5*  --   AST  --  25  ALT  --  12  ALKPHOS  --  75  BILITOT  --  0.5   ------------------------------------------------------------------------------------------------------------------  Cardiac Enzymes Recent Labs  Lab 06/24/18 1154  TROPONINI <0.03   ------------------------------------------------------------------------------------------------------------------  RADIOLOGY:  No results found.   ASSESSMENT AND PLAN:   * A. fib with RVR IN NSR today.  cardizem CD 120 mg Continue Metoprolol   Appreciated cardiology help.  *Alcohol  withdrawal ON CIWA protocol  added librium He does have chronic resting tremor  Added oral ativan PRN for Anxiety.  *Hypokalemia Replace oral.    *Hypertension Continue clonidine. Metoprolol, cardizem  *Myasthenia gravis Continue CellCept.  *Active smoking Counseled on admission  *  Generalized weakness   PT eval.  All the records are  reviewed and case discussed with Care Management/Social Worker Management plans discussed with the patient, family and they are in agreement.  CODE STATUS: FULL CODE  DVT Prophylaxis: SCDs  TOTAL TIME TAKING CARE OF THIS PATIENT: 35 minutes.   POSSIBLE D/C IN 1-2 DAYS, DEPENDING ON CLINICAL CONDITION.  Altamese Dilling M.D on 06/27/2018 at 9:56 PM  Between 7am to 6pm - Pager - [phone]  After 6pm go to [email or web address] - password [email or web address] [facility]  [facility] [place] [facility]  Office  [phone]  CC: Primary care physician; [name], MD  Note: This dictation was prepared with Dragon dictation along with smaller phrase technology. Any transcriptional errors that result from this process are unintentional.

## 2018-06-27 NOTE — Plan of Care (Signed)
  Problem: Education: Goal: Knowledge of General Education information will improve Description Including pain rating scale, medication(s)/side effects and non-pharmacologic comfort measures Outcome: Not Progressing   Problem: Health Behavior/Discharge Planning: Goal: Ability to manage health-related needs will improve Outcome: Not Progressing   Problem: Clinical Measurements: Goal: Ability to maintain clinical measurements within normal limits will improve Outcome: Not Progressing Goal: Cardiovascular complication will be avoided Outcome: Progressing   Problem: Pain Managment: Goal: General experience of comfort will improve Outcome: Not Progressing

## 2018-06-28 LAB — GLUCOSE, CAPILLARY
Glucose-Capillary: 139 mg/dL — ABNORMAL HIGH (ref 70–99)
Glucose-Capillary: 114 mg/dL — ABNORMAL HIGH (ref 70–99)

## 2018-06-28 MED ORDER — FOLIC ACID 1 MG PO TABS: 1.0000 mg | ORAL_TABLET | Freq: Every day | ORAL | 0 refills | Status: DC

## 2018-06-28 MED ORDER — LORAZEPAM 1 MG PO TABS: 1.0000 mg | ORAL_TABLET | Freq: Three times a day (TID) | ORAL | 0 refills | Status: DC | PRN

## 2018-06-28 MED ORDER — DILTIAZEM HCL ER COATED BEADS 120 MG PO CP24: 120.0000 mg | ORAL_CAPSULE | Freq: Every day | ORAL | 0 refills | Status: DC

## 2018-06-28 MED ORDER — OXYCODONE HCL 5 MG PO TABS: 5.0000 mg | ORAL_TABLET | Freq: Four times a day (QID) | ORAL | 0 refills | Status: DC | PRN

## 2018-06-28 MED ORDER — THIAMINE HCL 100 MG PO TABS: 100.0000 mg | ORAL_TABLET | Freq: Every day | ORAL | 0 refills | Status: DC

## 2018-06-28 NOTE — Discharge Summary (Signed)
[facility] [place] [facility]   PATIENT NAME: Taren Toops    MR#:  [id number]  DATE OF BIRTH:  07/30/1960  DATE OF ADMISSION:  06/24/2018 ADMITTING PHYSICIAN: Altamese Dilling, MD  DATE OF DISCHARGE: 06/28/2018   PRIMARY CARE PHYSICIAN: [name], MD    ADMISSION DIAGNOSIS:  ETOH abuse [F10.10] Atrial fibrillation with RVR (HCC) [I48.91] Alcoholic intoxication with complication (HCC) [F10.929]  DISCHARGE DIAGNOSIS:  Active Problems:   Atrial fibrillation with RVR (HCC)   Alcohol use disorder, severe, dependence (HCC)   Alcohol withdrawal delirium (HCC)   SECONDARY DIAGNOSIS:   Past Medical History:  Diagnosis Date  . A-fib (HCC)   . Alcohol abuse   . Alcohol withdrawal (HCC) 11/12/2016  . Diabetes mellitus without complication (HCC)   . DJD (degenerative joint disease)   . Hypertension   . Myasthenia gravis (HCC)   . Renal disorder     HOSPITAL COURSE:   * A. fib with RVR IN NSR today.  cardizem CD 120 mg Continue Metoprolol   Appreciated cardiology help.  *Alcohol  withdrawal ON CIWA protocol  added librium He does have chronic resting tremor  Added oral ativan PRN for Anxiety. Much improved.  *Hypokalemia Replace oral.   *Hypertension Continue clonidine. Metoprolol, cardizem  *Myasthenia gravis Continue CellCept.  *Active smoking Counseled on admission  * Generalized weakness   PT eval. suggested home health at discharge.  DISCHARGE CONDITIONS:   Stable  CONSULTS OBTAINED:  Treatment Team:  Laurier Nancy, MD  DRUG ALLERGIES:   Allergies  Allergen Reactions  . Nsaids Other (See Comments)    Duodenal ulcers, GI bleeding  . Other Other (See Comments)    Patient states he can't take any "mycin" medications because myasthenia gravis.    DISCHARGE MEDICATIONS:   Allergies as of 06/28/2018      Reactions   Nsaids Other (See Comments)   Duodenal ulcers, GI bleeding   Other Other  (See Comments)   Patient states he can't take any "mycin" medications because myasthenia gravis.      Medication List    STOP taking these medications   chlordiazePOXIDE 25 MG capsule Commonly known as:  LIBRIUM   mirtazapine 15 MG tablet Commonly known as:  REMERON     TAKE these medications   B-complex with vitamin C tablet Take 1 tablet by mouth daily.   cloNIDine 0.1 mg/24hr patch Commonly known as:  CATAPRES - Dosed in mg/24 hr Place 1 patch (0.1 mg total) onto the skin once a week.   diltiazem 120 MG 24 hr capsule Commonly known as:  CARDIZEM CD Take 1 capsule (120 mg total) by mouth daily. Start taking on:  June 29, 2018   folic acid 1 MG tablet Commonly known as:  FOLVITE Take 1 tablet (1 mg total) by mouth daily. Start taking on:  June 29, 2018   gabapentin 300 MG capsule Commonly known as:  NEURONTIN Take 2 capsules (600 mg total) by mouth 3 (three) times daily.   LORazepam 1 MG tablet Commonly known as:  ATIVAN Take 1 tablet (1 mg total) by mouth every 8 (eight) hours as needed for anxiety.   metFORMIN 500 MG tablet Commonly known as:  GLUCOPHAGE Take 1 tablet (500 mg total) by mouth daily.   metoprolol succinate 50 MG 24 hr tablet Commonly known as:  TOPROL-XL Take 1 tablet (50 mg total) by mouth daily. Take with or immediately following a meal.   multivitamin Tabs tablet Take  1 tablet by mouth daily.   mycophenolate 500 MG tablet Commonly known as:  CELLCEPT Take 1 tablet (500 mg total) by mouth 2 (two) times daily. Take 1 tablet (500MG ) by mouth every morning and 2 tablets (1000MG ) by mouth every evening What changed:    how much to take  when to take this   senna-docusate 8.6-50 MG tablet Commonly known as:  Senokot-S Take 1 tablet by mouth at bedtime as needed for mild constipation. What changed:  when to take this   testosterone cypionate 200 MG/ML injection Commonly known as:  DEPOTESTOSTERONE CYPIONATE Inject 200 mg into  the muscle every 14 (fourteen) days.   thiamine 100 MG tablet Take 1 tablet (100 mg total) by mouth daily. Start taking on:  June 29, 2018   traZODone 100 MG tablet Commonly known as:  DESYREL Take 1 tablet (100 mg total) by mouth at bedtime.        DISCHARGE INSTRUCTIONS:   Follow with primary care physician in 1 week.  Advised strongly against drinking alcohol.  If you experience worsening of your admission symptoms, develop shortness of breath, life threatening emergency, suicidal or homicidal thoughts you must seek medical attention immediately by calling 911 or calling your MD immediately  if symptoms less severe.  You Must read complete instructions/literature along with all the possible adverse reactions/side effects for all the Medicines you take and that have been prescribed to you. Take any new Medicines after you have completely understood and accept all the possible adverse reactions/side effects.   Please note  You were cared for by a hospitalist during your hospital stay. If you have any questions about your discharge medications or the care you received while you were in the hospital after you are discharged, you can call the unit and asked to speak with the hospitalist on call if the hospitalist that took care of you is not available. Once you are discharged, your primary care physician will handle any further medical issues. Please note that NO REFILLS for any discharge medications will be authorized once you are discharged, as it is imperative that you return to your primary care physician (or establish a relationship with a primary care physician if you do not have one) for your aftercare needs so that they can reassess your need for medications and monitor your lab values.    Today   CHIEF COMPLAINT:   Chief Complaint  Patient presents with  . Alcohol Intoxication    HISTORY OF PRESENT ILLNESS:  Nilda SimmerJohn Lim  is a 58 y.o. male with a known history of  atrial fibrillation, alcohol abuse, diabetes, hypertension, myasthenia gravis-brought by family because of shaking and alcohol abuse.  Noted to have A. fib with RVR in ER and started on Cardizem drip.  Patient did not have any complaint of palpitation or chest pain.  He had excessive shaking.  He drinks 20 of airplane-sized bottles of rum every day.  Last drink was today morning. ER physician gave some Ativan injection to prevent alcohol withdrawal and called hospitalist service for further management.   VITAL SIGNS:  Blood pressure 103/72, pulse 87, temperature 99 F (37.2 C), temperature source Oral, resp. rate 16, height 5\' 11"  (1.803 m), weight 85.8 kg, SpO2 97 %.  I/O:    Intake/Output Summary (Last 24 hours) at 06/28/2018 1828 Last data filed at 06/28/2018 0850 Gross per 24 hour  Intake 3 ml  Output 1100 ml  Net -1097 ml    PHYSICAL EXAMINATION:  GENERAL:  58 y.o.-year-old patient lying in the bed  EYES: Pupils equal, round, reactive to light and accommodation. No scleral icterus. Extraocular muscles intact.  HEENT: Head atraumatic, normocephalic. Oropharynx and nasopharynx clear.  NECK:  Supple, no jugular venous distention. No thyroid enlargement, no tenderness.  LUNGS: Normal breath sounds bilaterally, no wheezing, rales, rhonchi. No use of accessory muscles of respiration.  CARDIOVASCULAR: S1, S2 normal. No murmurs, rubs, or gallops.  ABDOMEN: Soft, nontender, nondistended. Bowel sounds present. No organomegaly or mass.  EXTREMITIES: No cyanosis, clubbing or edema b/l.    NEUROLOGIC: Cranial nerves II through XII are intact. No focal Motor or sensory deficits b/l.  Have no tremors. PSYCHIATRIC: The patient is alert and oriented x 3.  SKIN: No obvious rash, lesion, or ulcer.   DATA REVIEW:   CBC Recent Labs  Lab 06/25/18 0745  WBC 5.4  HGB 11.2*  HCT 34.8*  PLT 169    Chemistries  Recent Labs  Lab 06/25/18 0745 06/26/18 0534  NA 136 138  K 3.3* 3.9  CL 103  107  CO2 27 24  GLUCOSE 131* 131*  BUN 7 10  CREATININE 0.59* 0.69  CALCIUM 8.2* 8.3*  MG 1.5*  --   AST  --  25  ALT  --  12  ALKPHOS  --  75  BILITOT  --  0.5    Cardiac Enzymes Recent Labs  Lab 06/24/18 1154  TROPONINI <0.03    Microbiology Results  Results for orders placed or performed during the hospital encounter of 04/15/18  MRSA PCR Screening     Status: None   Collection Time: 04/15/18 11:53 AM  Result Value Ref Range Status   MRSA by PCR NEGATIVE NEGATIVE Final    Comment:        The GeneXpert MRSA Assay (FDA approved for NASAL specimens only), is one component of a comprehensive MRSA colonization surveillance program. It is not intended to diagnose MRSA infection nor to guide or monitor treatment for MRSA infections. Performed at [facility], [address], [place], [place] [address]     RADIOLOGY:  No results found.  EKG:   Orders placed or performed during the hospital encounter of 06/24/18  . ED EKG  . ED EKG  . EKG      Management plans discussed with the patient, family and they are in agreement.  CODE STATUS:     Code Status Orders  (From admission, onward)         Start     Ordered   06/24/18 1438  Full code  Continuous     06/24/18 1438        Code Status History    Date Active Date Inactive Code Status Order ID Comments User Context   06/24/2018 1207 06/24/2018 1438 Full Code [id number]  Arnaldo Natal, MD ED   06/12/2018 0350 06/13/2018 0101 Full Code [id number]  Darci Current, MD ED   05/25/2018 1609 05/30/2018 1913 Full Code [id number]  Luciano Cutter, DO Inpatient   05/17/2018 0121 05/21/2018 1635 Full Code [id number]  Mayo, Allyn Kenner, MD Inpatient   04/15/2018 1145 04/19/2018 2132 Full Code [id number]  Delfino Lovett, MD Inpatient   04/03/2018 0547 04/10/2018 2052 Full Code [id number]  Barbaraann Rondo, MD ED   02/04/2018 1528 02/07/2018 1728 Full Code [id number]  Mayo, Allyn Kenner, MD ED   02/03/2018 2233 02/04/2018  0841 Full Code [id number]  Jene Every, MD ED   01/20/2018 0140 01/22/2018 1819 Full  Code [id number]  Barbaraann Rondo, MD ED   01/19/2018 1239 01/20/2018 0140 Full Code [id number]  Myrna Blazer, MD ED   12/22/2017 0841 12/25/2017 2031 Full Code [id number]  Barbaraann Rondo, MD Inpatient   10/27/2017 1522 11/02/2017 1432 Full Code [id number]  Milagros Loll, MD ED   10/21/2017 1945 10/23/2017 1833 Full Code [id number]  Auburn Bilberry, MD Inpatient   10/21/2017 1504 10/21/2017 1945 Full Code [id number]  Arnaldo Natal, MD ED   10/10/2017 1314 10/11/2017 1147 Full Code [id number]  Willy Eddy, MD ED   10/05/2017 2214 10/07/2017 2101 Full Code [id number]  Arnaldo Natal, MD ED   10/05/2017 1924 10/05/2017 2214 Full Code [id number]  Arnaldo Natal, MD ED   10/01/2017 0807 02/19/2018 1650 Full Code [id number]  Barbaraann Rondo, MD Inpatient   08/24/2017 1756 08/27/2017 1849 Full Code [id number]  Enid Baas, MD ED   08/14/2017 0030 08/18/2017 1605 Full Code [id number]  Oralia Manis, MD Inpatient   06/04/2017 0116 06/06/2017 1701 Full Code [id number]  Oralia Manis, MD Inpatient   03/19/2017 1559 03/20/2017 2128 Full Code [id number]  Arnaldo Natal, MD ED   02/25/2017 1421 03/01/2017 1829 Full Code [id number]  Houston Siren, MD Inpatient   11/12/2016 1533 11/14/2016 1425 Full Code [id number]  Marguarite Arbour, MD Inpatient   12/26/2015 1927 12/30/2015 1604 Full Code [id number]  Marguarite Arbour, MD Inpatient      TOTAL TIME TAKING CARE OF THIS PATIENT: 35  minutes.    Altamese Dilling M.D on 06/28/2018 at 6:28 PM  Between 7am to 6pm - Pager - [phone]  After 6pm go to [email or web address] - password [email or web address]  [facility]  Office  [phone]  CC: Primary care physician; [name], MD   Note: This dictation was prepared with Dragon dictation along with smaller phrase technology. Any transcriptional errors that result from this process are unintentional.

## 2018-06-28 NOTE — Care Management Note (Signed)
Case Management Note  Patient Details  Name: Kurt Horton MRN: [id number] Date of Birth: 1960-09-07  Subjective/Objective:      Patient is a frequent readmission to [facility] for alcohol detox.   Patient lives with his parents.  He does not have insurance states his card should be coming in the mail any day.  He obtains his medications at [facility] without difficulty.  He is current with his PCP.  He states either his parents will pick him up or he'll take an [place].  MD ordered [employer] PT.  This RNCM notified MD that there is no a home health agency that can take patient as he has a history of being belligerent and inappropriate in the past.  He has had the police called on him before by home health.  Offered out patient PT and patient refuses.           Action/Plan:   Expected Discharge Date:  06/28/18               Expected Discharge Plan:  Home/Self Care  In-House Referral:     Discharge planning Services  CM Consult  Post Acute Care Choice:    Choice offered to:     DME Arranged:    DME Agency:     HH Arranged:    HH Agency:     Status of Service:  Completed, signed off  If discussed at [employer] of Stay Meetings, dates discussed:    Additional Comments:  Sherren Kerns, RN 06/28/2018, 2:28 PM

## 2018-06-28 NOTE — Progress Notes (Signed)
Went over discharge instructions with the patient including new medications and follow-up appointment. Discontinue peripheral IV and telemetry monitor. Patient states that his visa card was missing, strip his bed and checked his room, also called security if he had belongings locked, visa not found. NT help patient transport.

## 2018-06-28 NOTE — Plan of Care (Signed)
  Problem: Clinical Measurements: Goal: Ability to maintain clinical measurements within normal limits will improve Outcome: Progressing Goal: Will remain free from infection Outcome: Progressing Note:  Remains afebrile Goal: Diagnostic test results will improve Outcome: Progressing Goal: Respiratory complications will improve Outcome: Progressing   Problem: Clinical Measurements: Goal: Cardiovascular complication will be avoided Note:  No arrhythmias over night   Problem: Pain Managment: Goal: General experience of comfort will improve Outcome: Not Progressing Note:  Chronic pain

## 2018-06-30 ENCOUNTER — Emergency Department
Admission: EM | Admit: 2018-06-30 | Discharge: 2018-06-30 | Disposition: A | Discharge status: Home / Self Care | Payer: Medicaid Other | Attending: Emergency Medicine | Admitting: Emergency Medicine

## 2018-06-30 ENCOUNTER — Emergency Department: Payer: Medicaid Other

## 2018-06-30 ENCOUNTER — Other Ambulatory Visit: Payer: Self-pay

## 2018-06-30 DIAGNOSIS — I10 Essential (primary) hypertension: Secondary | ICD-10-CM | POA: Diagnosis not present

## 2018-06-30 DIAGNOSIS — R05 Cough: Secondary | ICD-10-CM | POA: Diagnosis present

## 2018-06-30 DIAGNOSIS — I4891 Unspecified atrial fibrillation: Secondary | ICD-10-CM | POA: Diagnosis not present

## 2018-06-30 DIAGNOSIS — J101 Influenza due to other identified influenza virus with other respiratory manifestations: Secondary | ICD-10-CM

## 2018-06-30 DIAGNOSIS — E119 Type 2 diabetes mellitus without complications: Secondary | ICD-10-CM | POA: Diagnosis not present

## 2018-06-30 DIAGNOSIS — Z79899 Other long term (current) drug therapy: Secondary | ICD-10-CM | POA: Diagnosis not present

## 2018-06-30 DIAGNOSIS — F17210 Nicotine dependence, cigarettes, uncomplicated: Secondary | ICD-10-CM | POA: Diagnosis not present

## 2018-06-30 LAB — INFLUENZA PANEL BY PCR (TYPE A & B)
INFLAPCR: POSITIVE — AB
Influenza B By PCR: NEGATIVE

## 2018-06-30 MED ORDER — BENZONATATE 100 MG PO CAPS
100.0000 mg | ORAL_CAPSULE | Freq: Once | ORAL | Status: AC
  Administered 2018-06-30: 100 mg via ORAL
  Filled 2018-06-30: qty 1

## 2018-06-30 MED ORDER — CHLORDIAZEPOXIDE HCL 25 MG PO CAPS
25.0000 mg | ORAL_CAPSULE | Freq: Once | ORAL | Status: AC
  Administered 2018-06-30: 25 mg via ORAL
  Filled 2018-06-30: qty 1

## 2018-06-30 MED ORDER — OSELTAMIVIR PHOSPHATE 75 MG PO CAPS: 75.0000 mg | ORAL_CAPSULE | Freq: Two times a day (BID) | ORAL | 0 refills | Status: AC

## 2018-06-30 MED ORDER — OSELTAMIVIR PHOSPHATE 75 MG PO CAPS
75.0000 mg | ORAL_CAPSULE | Freq: Once | ORAL | Status: AC
  Administered 2018-06-30: 75 mg via ORAL
  Filled 2018-06-30: qty 1

## 2018-06-30 NOTE — ED Notes (Signed)
EMS pt from home with c/o cough and fever; audible wheezing; 150/90; HR109; temp 99.6

## 2018-06-30 NOTE — ED Triage Notes (Signed)
Patient reports coughing for the past 2 days.

## 2018-06-30 NOTE — ED Notes (Signed)
Pt resting with eyes closed, resp even and unlabored; waiting for treatment room

## 2018-06-30 NOTE — ED Provider Notes (Signed)
[facility] Emergency Department Provider Note __   First MD Initiated Contact with Patient 06/30/18 0510     (approximate)  I have reviewed the triage vital signs and the nursing notes.   HISTORY  Chief Complaint Cough    HPI Kurt Horton is a 58 y.o. male with below list of chronic medical conditions including alcohol abuse and recent hospital admission for atrial fibrillation presents to the emergency department with cough congestion generalized muscle aches x2 days.  Past Medical History:  Diagnosis Date  . A-fib (HCC)   . Alcohol abuse   . Alcohol withdrawal (HCC) 11/12/2016  . Diabetes mellitus without complication (HCC)   . DJD (degenerative joint disease)   . Hypertension   . Myasthenia gravis (HCC)   . Renal disorder     Patient Active Problem List   Diagnosis Date Noted  . Tobacco use disorder 05/27/2018  . GI bleed 02/04/2018  . Alcohol intoxication (HCC) 12/22/2017  . Malnutrition of moderate degree 10/04/2017  . Chronic combined systolic and diastolic CHF (congestive heart failure) (HCC) 08/13/2017  . Alcohol withdrawal delirium (HCC) 02/26/2017  . Atrial fibrillation with RVR (HCC) 02/25/2017  . Acute alcoholic intoxication with complication (HCC)   . Alcohol use disorder, severe, dependence (HCC)   . Substance induced mood disorder (HCC) 11/13/2016  . SVT (supraventricular tachycardia) (HCC) 11/12/2016  . HTN (hypertension) 11/12/2016  . EtOH dependence (HCC) 11/12/2016  . Lumbar radiculopathy 08/24/2016  . Major depressive disorder, recurrent severe without psychotic features (HCC) 08/24/2016  . Chronic low back pain 01/04/2016  . Pressure ulcer 12/29/2015  . Osteomyelitis (HCC) 12/26/2015  . PVD (peripheral vascular disease) (HCC) 12/26/2015  . Type II diabetes mellitus with manifestations (HCC) 12/26/2015  . Myasthenia gravis (HCC) 12/26/2015    Past Surgical History:  Procedure Laterality Date  . AMPUTATION  TOE Left 12/29/2015   Procedure: AMPUTATION TOE;  Surgeon: Gwyneth Revels, DPM;  Location: [facility] ORS;  Service: Podiatry;  Laterality: Left;  . ESOPHAGOGASTRODUODENOSCOPY N/A 02/05/2018   Procedure: ESOPHAGOGASTRODUODENOSCOPY (EGD);  Surgeon: Pasty Spillers, MD;  Location: [facility] ENDOSCOPY;  Service: Endoscopy;  Laterality: N/A;  . PERIPHERAL VASCULAR CATHETERIZATION N/A 12/27/2015   Procedure: Lower Extremity Angiography;  Surgeon: Annice Needy, MD;  Location: [facility] INVASIVE CV LAB;  Service: Cardiovascular;  Laterality: N/A;  . right hip surgery    . right shoulder surgery      Prior to Admission medications   Medication Sig Start Date End Date Taking? Authorizing Provider  B Complex-C (B-COMPLEX WITH VITAMIN C) tablet Take 1 tablet by mouth daily. 11/02/17   Alford Highland, MD  cloNIDine (CATAPRES - DOSED IN MG/24 HR) 0.1 mg/24hr patch Place 1 patch (0.1 mg total) onto the skin once a week. Patient not taking: Reported on 06/12/2018 05/30/18   Pucilowska, Ellin Goodie, MD  diltiazem (CARDIZEM CD) 120 MG 24 hr capsule Take 1 capsule (120 mg total) by mouth daily. 06/29/18   Altamese Dilling, MD  folic acid (FOLVITE) 1 MG tablet Take 1 tablet (1 mg total) by mouth daily. 06/29/18   Altamese Dilling, MD  gabapentin (NEURONTIN) 300 MG capsule Take 2 capsules (600 mg total) by mouth 3 (three) times daily. 05/30/18   Pucilowska, Jolanta B, MD  LORazepam (ATIVAN) 1 MG tablet Take 1 tablet (1 mg total) by mouth every 8 (eight) hours as needed for anxiety. 06/28/18   Altamese Dilling, MD  metFORMIN (GLUCOPHAGE) 500 MG tablet Take 1 tablet (500 mg total) by mouth  daily. 05/30/18   Pucilowska, Braulio ConteJolanta B, MD  metoprolol succinate (TOPROL-XL) 50 MG 24 hr tablet Take 1 tablet (50 mg total) by mouth daily. Take with or immediately following a meal. 05/30/18   Pucilowska, Jolanta B, MD  multivitamin (ONE-A-DAY MEN'S) TABS tablet Take 1 tablet by mouth daily. 04/19/18   Shaune Pollackhen, Qing, MD  mycophenolate  (CELLCEPT) 500 MG tablet Take 1 tablet (500 mg total) by mouth 2 (two) times daily. Take 1 tablet (500MG ) by mouth every morning and 2 tablets (1000MG ) by mouth every evening Patient taking differently: Take 500-1,000 mg by mouth See admin instructions. Take 1 tablet (500MG ) by mouth every morning and 2 tablets (1000MG ) by mouth every evening  05/30/18   Pucilowska, Jolanta B, MD  senna-docusate (SENOKOT-S) 8.6-50 MG tablet Take 1 tablet by mouth at bedtime as needed for mild constipation. Patient taking differently: Take 1 tablet by mouth daily.  12/25/17   Mayo, Allyn KennerKaty Dodd, MD  testosterone cypionate (DEPOTESTOSTERONE CYPIONATE) 200 MG/ML injection Inject 200 mg into the muscle every 14 (fourteen) days.    [provider]  thiamine 100 MG tablet Take 1 tablet (100 mg total) by mouth daily. 06/29/18   Altamese DillingVachhani, Vaibhavkumar, MD  traZODone (DESYREL) 100 MG tablet Take 1 tablet (100 mg total) by mouth at bedtime. 05/30/18   Pucilowska, Ellin GoodieJolanta B, MD    Allergies Nsaids and Other  Family History  Problem Relation Age of Onset  . Rheum arthritis Father     Social History Social History   Tobacco Use  . Smoking status: Current Every Day Smoker    Packs/day: 0.50    Types: Cigarettes  . Smokeless tobacco: Never Used  Substance Use Topics  . Alcohol use: Yes    Alcohol/week: 20.0 - 30.0 standard drinks    Types: 20 - 30 Shots of liquor per week    Comment: Last drink 3 days ago 06/22/2018- rum  . Drug use: Yes    Types: Oxycodone    Review of Systems Constitutional: No fever/chills Eyes: No visual changes. ENT: No sore throat. Cardiovascular: Denies chest pain. Respiratory: Denies shortness of breath.  Positive for cough and congestion Gastrointestinal: No abdominal pain.  No nausea, no vomiting.  No diarrhea.  No constipation. Genitourinary: Negative for dysuria. Musculoskeletal: Negative for neck pain.  Negative for back pain. Integumentary: Negative for rash. Neurological:  Negative for headaches, focal weakness or numbness.   ____________________________________________   PHYSICAL EXAM:  VITAL SIGNS: ED Triage Vitals  Enc Vitals Group     BP 06/30/18 0129 136/79     Pulse Rate 06/30/18 0129 68     Resp 06/30/18 0129 18     Temp 06/30/18 0129 99.5 F (37.5 C)     Temp Source 06/30/18 0129 Oral     SpO2 06/30/18 0129 95 %     Weight --      Height --      Head Circumference --      Peak Flow --      Pain Score 06/30/18 0128 8     Pain Loc --      Pain Edu? --      Excl. in GC? --     Constitutional: Alert and oriented. Well appearing and in no acute distress. Eyes: Conjunctivae are normal.  Head: Atraumatic. Nose: No congestion/rhinnorhea. Mouth/Throat: Mucous membranes are moist. Oropharynx non-erythematous. Neck: No stridor.   Cardiovascular: Normal rate, regular rhythm. Good peripheral circulation. Grossly normal heart sounds. Respiratory: Normal respiratory effort.  No retractions. Lungs CTAB. Gastrointestinal: Soft and nontender. No distention.  Musculoskeletal: No lower extremity tenderness nor edema. No gross deformities of extremities. Neurologic:  Normal speech and language. No gross focal neurologic deficits are appreciated.  Skin:  Skin is warm, dry and intact. No rash noted. Psychiatric: Mood and affect are normal. Speech and behavior are normal.  ____________________________________________   LABS (all labs ordered are listed, but only abnormal results are displayed)  Labs Reviewed  INFLUENZA PANEL BY PCR (TYPE A & B)   ____________________________________________  EKG ED ECG REPORT I, Bancroft N Westly Hinnant, the attending physician, personally viewed and interpreted this ECG.   Date: 06/30/2018  EKG Time: 30 8 AM  Rate: 107  Rhythm: Sinus tachycardia  Axis: Normal  Intervals: Normal  ST&T Change: None  ____________________________________________  RADIOLOGY I, Galva N Achilles Neville, personally viewed and evaluated  these images (plain radiographs) as part of my medical decision making, as well as reviewing the written report by the radiologist.  ED MD interpretation: Bronchitic changes predominantly in the left mid and lower lung zones per radiologist no focal consolidation.  Official radiology report(s): Dg Chest 2 View  Result Date: 06/30/2018 CLINICAL DATA:  58 y/o  M; 2 days of cough. EXAM: CHEST - 2 VIEW COMPARISON:  01/21/2018 chest radiograph. FINDINGS: Stable heart size and mediastinal contours are within normal limits given projection and technique. Bronchitic changes predominantly in left mid and lower lung zones. Focal consolidation. No pleural effusion or pneumothorax. Straightening of thoracic kyphosis. No acute osseous abnormality is evident. IMPRESSION: Bronchitic changes predominantly in left mid and lower lung zones. No Focal consolidation. Electronically Signed   By: Mitzi Hansen M.D.   On: 06/30/2018 02:25     Procedures   ____________________________________________   INITIAL IMPRESSION / MDM / ASSESSMENT AND PLAN / ED COURSE  As part of my medical decision making, I reviewed the following data within the electronic MEDICAL RECORD NUMBER   58 year old male presenting with above-stated history and physical exam with differential diagnosis including influenza, pneumonia, bronchitis.  Influenza swab positive for influenza A.  Chest x-ray revealed bronchitic changes.  Patient given Tamiflu in the emergency department will be prescribed the same for home.    ____________________________________________  FINAL CLINICAL IMPRESSION(S) / ED DIAGNOSES  Final diagnoses:  Influenza A     MEDICATIONS GIVEN DURING THIS VISIT:  Medications  chlordiazePOXIDE (LIBRIUM) capsule 25 mg (25 mg Oral Given 06/30/18 0505)  benzonatate (TESSALON) capsule 100 mg (100 mg Oral Given 06/30/18 0505)     ED Discharge Orders    None       Note:  This document was prepared using Dragon  voice recognition software and may include unintentional dictation errors.   Darci Current, MD 06/30/18 [phone]

## 2018-06-30 NOTE — ED Notes (Signed)
Pt ambulatory with a slow steady gait, using a cane, to move across the waiting room in hopes of finding somewhere to lay down

## 2018-06-30 NOTE — ED Notes (Signed)
Pt reports discharged from hospital Friday. Developed non productive cough while here as a patient.

## 2018-07-06 ENCOUNTER — Encounter: Payer: Self-pay | Admitting: Emergency Medicine

## 2018-07-06 ENCOUNTER — Other Ambulatory Visit: Payer: Self-pay

## 2018-07-06 ENCOUNTER — Emergency Department
Admission: EM | Admit: 2018-07-06 | Discharge: 2018-07-07 | Disposition: A | Discharge status: Home / Self Care | Payer: Medicaid Other | Attending: Emergency Medicine | Admitting: Emergency Medicine

## 2018-07-06 DIAGNOSIS — F10920 Alcohol use, unspecified with intoxication, uncomplicated: Secondary | ICD-10-CM

## 2018-07-06 DIAGNOSIS — I110 Hypertensive heart disease with heart failure: Secondary | ICD-10-CM | POA: Insufficient documentation

## 2018-07-06 DIAGNOSIS — F17210 Nicotine dependence, cigarettes, uncomplicated: Secondary | ICD-10-CM | POA: Insufficient documentation

## 2018-07-06 DIAGNOSIS — F1010 Alcohol abuse, uncomplicated: Secondary | ICD-10-CM | POA: Insufficient documentation

## 2018-07-06 DIAGNOSIS — F10120 Alcohol abuse with intoxication, uncomplicated: Secondary | ICD-10-CM | POA: Diagnosis present

## 2018-07-06 DIAGNOSIS — I5042 Chronic combined systolic (congestive) and diastolic (congestive) heart failure: Secondary | ICD-10-CM | POA: Diagnosis not present

## 2018-07-06 DIAGNOSIS — E876 Hypokalemia: Secondary | ICD-10-CM | POA: Diagnosis not present

## 2018-07-06 DIAGNOSIS — E119 Type 2 diabetes mellitus without complications: Secondary | ICD-10-CM | POA: Insufficient documentation

## 2018-07-06 LAB — CBC WITH DIFFERENTIAL/PLATELET
Abs Immature Granulocytes: 0.02 10*3/uL (ref 0.00–0.07)
Basophils Absolute: 0 10*3/uL (ref 0.0–0.1)
Basophils Relative: 1 %
Eosinophils Absolute: 0 10*3/uL (ref 0.0–0.5)
Eosinophils Relative: 0 %
HCT: 43.7 % (ref 39.0–52.0)
Hemoglobin: 14 g/dL (ref 13.0–17.0)
Immature Granulocytes: 0 %
Lymphocytes Relative: 66 %
Lymphs Abs: 3 10*3/uL (ref 0.7–4.0)
MCH: 27.6 pg (ref 26.0–34.0)
MCHC: 32 g/dL (ref 30.0–36.0)
MCV: 86.2 fL (ref 80.0–100.0)
Monocytes Absolute: 0.4 10*3/uL (ref 0.1–1.0)
Monocytes Relative: 10 %
Neutro Abs: 1.1 10*3/uL — ABNORMAL LOW (ref 1.7–7.7)
Neutrophils Relative %: 23 %
Platelets: 296 10*3/uL (ref 150–400)
RBC: 5.07 MIL/uL (ref 4.22–5.81)
RDW: 18.9 % — AB (ref 11.5–15.5)
WBC: 4.6 10*3/uL (ref 4.0–10.5)
nRBC: 0 % (ref 0.0–0.2)

## 2018-07-06 LAB — COMPREHENSIVE METABOLIC PANEL
ALT: 20 U/L (ref 0–44)
AST: 28 U/L (ref 15–41)
Albumin: 3.6 g/dL (ref 3.5–5.0)
Alkaline Phosphatase: 72 U/L (ref 38–126)
Anion gap: 12 (ref 5–15)
BUN: <5 mg/dL — ABNORMAL LOW (ref 6–20)
CO2: 27 mmol/L (ref 22–32)
Calcium: 8.3 mg/dL — ABNORMAL LOW (ref 8.9–10.3)
Chloride: 104 mmol/L (ref 98–111)
Creatinine, Ser: 0.62 mg/dL (ref 0.61–1.24)
GFR calc Af Amer: >60 mL/min (ref >60)
GFR calc non Af Amer: >60 mL/min (ref >60)
Glucose, Bld: 111 mg/dL — ABNORMAL HIGH (ref 70–99)
Potassium: 2.6 mmol/L — CL (ref 3.5–5.1)
Sodium: 143 mmol/L (ref 135–145)
Total Bilirubin: 0.3 mg/dL (ref 0.3–1.2)
Total Protein: 6.5 g/dL (ref 6.5–8.1)

## 2018-07-06 LAB — ETHANOL: Alcohol, Ethyl (B): 338 mg/dL (ref <10)

## 2018-07-06 MED ORDER — POTASSIUM CHLORIDE CRYS ER 20 MEQ PO TBCR
60.0000 meq | EXTENDED_RELEASE_TABLET | Freq: Once | ORAL | Status: AC
  Administered 2018-07-06: 60 meq via ORAL
  Filled 2018-07-06: qty 3

## 2018-07-06 MED ORDER — THIAMINE HCL 100 MG/ML IJ SOLN
Freq: Once | INTRAVENOUS | Status: AC
  Administered 2018-07-06: 21:00:00 via INTRAVENOUS
  Filled 2018-07-06: qty 1000

## 2018-07-06 NOTE — ED Provider Notes (Signed)
[facility] Emergency Department Provider Note       Time seen: ----------------------------------------- 7:48 PM on 07/06/2018 -----------------------------------------   I have reviewed the triage vital signs and the nursing notes.  HISTORY   Chief Complaint No chief complaint on file.   HPI Kurt Horton is a 58 y.o. male with a history of atrial fibrillation, alcohol abuse, diabetes, hypertension who presents to the ED for concerns about alcohol poisoning.  Patient arrives by EMS for same.  Patient states he has been drinking heavily today because he enjoys it.  EMS reports he had soiled his pants.  He was not trying to harm himself.  He denies any recent illness.  Past Medical History:  Diagnosis Date  . A-fib (HCC)   . Alcohol abuse   . Alcohol withdrawal (HCC) 11/12/2016  . Diabetes mellitus without complication (HCC)   . DJD (degenerative joint disease)   . Hypertension   . Myasthenia gravis (HCC)   . Renal disorder     Patient Active Problem List   Diagnosis Date Noted  . Tobacco use disorder 05/27/2018  . GI bleed 02/04/2018  . Alcohol intoxication (HCC) 12/22/2017  . Malnutrition of moderate degree 10/04/2017  . Chronic combined systolic and diastolic CHF (congestive heart failure) (HCC) 08/13/2017  . Alcohol withdrawal delirium (HCC) 02/26/2017  . Atrial fibrillation with RVR (HCC) 02/25/2017  . Acute alcoholic intoxication with complication (HCC)   . Alcohol use disorder, severe, dependence (HCC)   . Substance induced mood disorder (HCC) 11/13/2016  . SVT (supraventricular tachycardia) (HCC) 11/12/2016  . HTN (hypertension) 11/12/2016  . EtOH dependence (HCC) 11/12/2016  . Lumbar radiculopathy 08/24/2016  . Major depressive disorder, recurrent severe without psychotic features (HCC) 08/24/2016  . Chronic low back pain 01/04/2016  . Pressure ulcer 12/29/2015  . Osteomyelitis (HCC) 12/26/2015  . PVD (peripheral vascular  disease) (HCC) 12/26/2015  . Type II diabetes mellitus with manifestations (HCC) 12/26/2015  . Myasthenia gravis (HCC) 12/26/2015    Past Surgical History:  Procedure Laterality Date  . AMPUTATION TOE Left 12/29/2015   Procedure: AMPUTATION TOE;  Surgeon: Gwyneth Revels, DPM;  Location: [facility] ORS;  Service: Podiatry;  Laterality: Left;  . ESOPHAGOGASTRODUODENOSCOPY N/A 02/05/2018   Procedure: ESOPHAGOGASTRODUODENOSCOPY (EGD);  Surgeon: Pasty Spillers, MD;  Location: [facility] ENDOSCOPY;  Service: Endoscopy;  Laterality: N/A;  . PERIPHERAL VASCULAR CATHETERIZATION N/A 12/27/2015   Procedure: Lower Extremity Angiography;  Surgeon: Annice Needy, MD;  Location: [facility] INVASIVE CV LAB;  Service: Cardiovascular;  Laterality: N/A;  . right hip surgery    . right shoulder surgery      Allergies Nsaids and Other  Social History Social History   Tobacco Use  . Smoking status: Current Every Day Smoker    Packs/day: 0.50    Types: Cigarettes  . Smokeless tobacco: Never Used  Substance Use Topics  . Alcohol use: Yes    Alcohol/week: 20.0 - 30.0 standard drinks    Types: 20 - 30 Shots of liquor per week    Comment: Last drink 3 days ago 06/22/2018- rum  . Drug use: Yes    Types: Oxycodone   Review of Systems Constitutional: Negative for fever. Cardiovascular: Negative for chest pain. Respiratory: Negative for shortness of breath. Gastrointestinal: Negative for abdominal pain, vomiting and diarrhea. Musculoskeletal: Negative for back pain. Skin: Negative for rash. Neurological: Negative for headaches, focal weakness or numbness. Psychiatric: Positive for alcohol intake, negative for suicidal or homicidal ideation  All systems negative/normal/unremarkable except as stated  in the HPI  ____________________________________________   PHYSICAL EXAM:  VITAL SIGNS: ED Triage Vitals  Enc Vitals Group     BP      Pulse      Resp      Temp      Temp src      SpO2      Weight      Height       Head Circumference      Peak Flow      Pain Score      Pain Loc      Pain Edu?      Excl. in GC?    Constitutional: Alert but somewhat drowsy, no distress.  Disheveled appearance Eyes: Conjunctivae are normal. Normal extraocular movements. ENT      Head: Normocephalic and atraumatic.      Nose: No congestion/rhinnorhea.      Mouth/Throat: Mucous membranes are moist.      Neck: No stridor. Cardiovascular: Normal rate, regular rhythm. No murmurs, rubs, or gallops. Respiratory: Normal respiratory effort without tachypnea nor retractions. Breath sounds are clear and equal bilaterally. No wheezes/rales/rhonchi. Gastrointestinal: Soft and nontender. Normal bowel sounds Musculoskeletal: Nontender with normal range of motion in extremities. No lower extremity tenderness nor edema. Neurologic:  Normal speech and language. No gross focal neurologic deficits are appreciated.  Skin:  Skin is warm, dry and intact. No rash noted. Psychiatric: Mood and affect are normal. Speech and behavior are normal.  ____________________________________________  ED COURSE:  As part of my medical decision making, I reviewed the following data within the electronic MEDICAL RECORD NUMBER History obtained from family if available, nursing notes, old chart and ekg, as well as notes from prior ED visits. Patient presented for alcohol abuse, we will assess with labs and imaging as indicated at this time. Clinical Course as of Feb 29 2137  Sat Jul 06, 2018  2058 Alcohol, Ethyl (B)(!!): 338 [JW]  2058 Potassium being repleted orally  Potassium(!!): 2.6 [JW]    Clinical Course User Index [JW] Emily Filbert, MD   Procedures ____________________________________________   LABS (pertinent positives/negatives)  Labs Reviewed  ETHANOL - Abnormal; Notable for the following components:      Result Value   Alcohol, Ethyl (B) 338 (*)    All other components within normal limits  CBC WITH DIFFERENTIAL/PLATELET -  Abnormal; Notable for the following components:   RDW 18.9 (*)    Neutro Abs 1.1 (*)    All other components within normal limits  COMPREHENSIVE METABOLIC PANEL - Abnormal; Notable for the following components:   Potassium 2.6 (*)    Glucose, Bld 111 (*)    BUN <5 (*)    Calcium 8.3 (*)    All other components within normal limits   ____________________________________________   DIFFERENTIAL DIAGNOSIS   Chronic alcohol use, dehydration, electrolyte abnormality, withdrawal  FINAL ASSESSMENT AND PLAN  Alcohol use disorder, hypokalemia   Plan: The patient had presented for alcohol intoxication. Patient's labs did reveal significant alcohol intoxication with a level 338.  His potassium was also low at 2.6.  He was repleted with oral potassium.  We did give an IV banana bag.  Patient appears medically cleared once he sobers enough to walk without falling.   Ulice Dash, MD    Note: This note was generated in part or whole with voice recognition software. Voice recognition is usually quite accurate but there are transcription errors that can and very often do occur. I apologize  for any typographical errors that were not detected and corrected.     Emily FilbertWilliams, Javonni Macke E, MD 07/06/18 2138

## 2018-07-06 NOTE — ED Notes (Signed)
CRITICAL LAB: ETOH is 338, [facility], Dr. Mayford Knife notified, orders received

## 2018-07-06 NOTE — ED Triage Notes (Signed)
Patient presents to Emergency Department via [facility] from home with complaints of "alcohol poisoning", pt reports intentional alcohol consumption without intent to harm self but "because it's fun". Pt reports hx of ETOH problems.   Pt wakes to stimuli to answer questions. Per EMS pt has "soiled his pants"

## 2018-07-06 NOTE — ED Notes (Signed)
Pt reports last drink earlier today, reports drinking "about 10 of those little airplane bottles"  Reports today's day as Thursday

## 2018-07-06 NOTE — ED Notes (Signed)
CRITICAL LAB: POTASSIUM is 2.6, [facility], Dr. Mayford Knife notified, orders received

## 2018-07-07 DIAGNOSIS — I5042 Chronic combined systolic (congestive) and diastolic (congestive) heart failure: Secondary | ICD-10-CM | POA: Diagnosis not present

## 2018-07-07 DIAGNOSIS — F1010 Alcohol abuse, uncomplicated: Secondary | ICD-10-CM | POA: Diagnosis not present

## 2018-07-07 DIAGNOSIS — E119 Type 2 diabetes mellitus without complications: Secondary | ICD-10-CM | POA: Diagnosis not present

## 2018-07-07 DIAGNOSIS — F17210 Nicotine dependence, cigarettes, uncomplicated: Secondary | ICD-10-CM | POA: Diagnosis not present

## 2018-07-07 DIAGNOSIS — E876 Hypokalemia: Secondary | ICD-10-CM | POA: Diagnosis not present

## 2018-07-07 DIAGNOSIS — F10120 Alcohol abuse with intoxication, uncomplicated: Secondary | ICD-10-CM | POA: Diagnosis present

## 2018-07-07 DIAGNOSIS — I110 Hypertensive heart disease with heart failure: Secondary | ICD-10-CM | POA: Diagnosis not present

## 2018-07-07 MED ORDER — LORAZEPAM 2 MG PO TABS
2.0000 mg | ORAL_TABLET | Freq: Once | ORAL | Status: AC
  Administered 2018-07-07: 2 mg via ORAL
  Filled 2018-07-07: qty 1

## 2018-07-07 MED ORDER — CHLORDIAZEPOXIDE HCL 25 MG PO CAPS
50.0000 mg | ORAL_CAPSULE | Freq: Once | ORAL | Status: AC
  Administered 2018-07-07: 50 mg via ORAL
  Filled 2018-07-07: qty 2

## 2018-07-07 NOTE — ED Notes (Signed)
Case worker at bedside.

## 2018-07-07 NOTE — ED Notes (Signed)
PT moved to 12H, requesting "something for these shakes" will discuss same with Dr. Cyril Loosen.  Pt requesting to "just put me in a cab."  Explained to pt that he is not stable enough for a cab."  Will continue to attempt to contact family.

## 2018-07-07 NOTE — ED Notes (Signed)
PT ambulatory to BR with RN near by.  Pt ambulatory with a tremor, unsteady gait.

## 2018-07-07 NOTE — ED Notes (Signed)
Erskine Squibb, RN agrees with this RN that pt gait is not steady enough for cab transportation.  Pt to be moved to 12H until safe transportation can be secured.  Pt aware of same.

## 2018-07-07 NOTE — ED Notes (Addendum)
Case worker discussed options with patient.  Dr. Cyril Loosen feels that pt is stable enough to be discharged and released in a cab.  Case work states pt has neuropathy and has a normally unsteady gait, but that pt is alert, oriented and able to make his own decisions and that he is able to be released to cab.  Pt to be discharged to lobby to wait for cab at this time.

## 2018-07-07 NOTE — ED Notes (Signed)
Pt cleaned, ambulatory to toilet, dress in paper scrubs, clothes in belongings bag  Spoke to father on phone "I can't come get Evann"  Multiple msg left for taxi to pick up pt

## 2018-07-07 NOTE — ED Notes (Signed)
Call from EMS states they are unable to provide transport for this patient as he does not meet transportation criteria.  EMS requests that pt return home in a cab.  Discussed same with Erskine Squibb, RN charge nurse, she will evaluate pt for appropriateness of cab transport.

## 2018-07-07 NOTE — Progress Notes (Addendum)
LCSW- Education officer, museum met with patient. He was engaged and clear and able to identify his current medical concerns. 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 He was agreeable to receive resources for both residential and out patient treatment options for his drinking issues. He reported he has been in 3 separate facilities [facility], [facility] several times in the last 20 years. He continues to struggle with his alcohol use. He is agreeable to follow up with his personal care physician Dr Ginette Pitman to look into additional help for his neuropathy in his feet, diabetic medications and and his alcohol issues.  Handouts for [facility], [facility] and in patient treatment options were discussed with patient.  Consulted with ED charge- and Taxi voucher was completed by Education officer, museum and signed by ED charge.  No further SW needs   [employer] LCSW [phone]

## 2018-07-07 NOTE — Discharge Instructions (Addendum)
Please seek medical attention for any high fevers, chest pain, shortness of breath, change in behavior, persistent vomiting, bloody stool or any other new or concerning symptoms.  

## 2018-07-07 NOTE — ED Notes (Signed)
Report received, pt awaiting EMS transport at this time.  Will monitor.

## 2018-08-12 ENCOUNTER — Other Ambulatory Visit: Payer: Self-pay

## 2018-08-12 ENCOUNTER — Emergency Department: Payer: Medicare Other

## 2018-08-12 ENCOUNTER — Emergency Department
Admission: EM | Admit: 2018-08-12 | Discharge: 2018-08-13 | Disposition: A | Discharge status: Home / Self Care | Payer: Medicare Other | Attending: Emergency Medicine | Admitting: Emergency Medicine

## 2018-08-12 DIAGNOSIS — F10920 Alcohol use, unspecified with intoxication, uncomplicated: Secondary | ICD-10-CM

## 2018-08-12 DIAGNOSIS — Z79899 Other long term (current) drug therapy: Secondary | ICD-10-CM | POA: Insufficient documentation

## 2018-08-12 DIAGNOSIS — I5042 Chronic combined systolic (congestive) and diastolic (congestive) heart failure: Secondary | ICD-10-CM | POA: Diagnosis not present

## 2018-08-12 DIAGNOSIS — Z7984 Long term (current) use of oral hypoglycemic drugs: Secondary | ICD-10-CM | POA: Insufficient documentation

## 2018-08-12 DIAGNOSIS — E119 Type 2 diabetes mellitus without complications: Secondary | ICD-10-CM | POA: Diagnosis not present

## 2018-08-12 DIAGNOSIS — I110 Hypertensive heart disease with heart failure: Secondary | ICD-10-CM | POA: Diagnosis not present

## 2018-08-12 DIAGNOSIS — Y908 Blood alcohol level of 240 mg/100 ml or more: Secondary | ICD-10-CM | POA: Insufficient documentation

## 2018-08-12 DIAGNOSIS — Z89422 Acquired absence of other left toe(s): Secondary | ICD-10-CM | POA: Diagnosis not present

## 2018-08-12 DIAGNOSIS — F17210 Nicotine dependence, cigarettes, uncomplicated: Secondary | ICD-10-CM | POA: Insufficient documentation

## 2018-08-12 LAB — CBC WITH DIFFERENTIAL/PLATELET
Abs Immature Granulocytes: 0.02 10*3/uL (ref 0.00–0.07)
Basophils Absolute: 0.1 10*3/uL (ref 0.0–0.1)
Basophils Relative: 3 %
Eosinophils Absolute: 0.1 10*3/uL (ref 0.0–0.5)
Eosinophils Relative: 2 %
HCT: 40.9 % (ref 39.0–52.0)
Hemoglobin: 13.2 g/dL (ref 13.0–17.0)
Immature Granulocytes: 0 %
Lymphocytes Relative: 54 %
Lymphs Abs: 2.7 10*3/uL (ref 0.7–4.0)
MCH: 28.4 pg (ref 26.0–34.0)
MCHC: 32.3 g/dL (ref 30.0–36.0)
MCV: 88 fL (ref 80.0–100.0)
Monocytes Absolute: 0.4 10*3/uL (ref 0.1–1.0)
Monocytes Relative: 9 %
Neutro Abs: 1.6 10*3/uL — ABNORMAL LOW (ref 1.7–7.7)
Neutrophils Relative %: 32 %
Platelets: 275 10*3/uL (ref 150–400)
RBC: 4.65 MIL/uL (ref 4.22–5.81)
RDW: 18.6 % — ABNORMAL HIGH (ref 11.5–15.5)
WBC: 5 10*3/uL (ref 4.0–10.5)
nRBC: 0 % (ref 0.0–0.2)

## 2018-08-12 LAB — SALICYLATE LEVEL: Salicylate Lvl: <7 mg/dL (ref 2.8–30.0)

## 2018-08-12 LAB — COMPREHENSIVE METABOLIC PANEL
ALT: 13 U/L (ref 0–44)
AST: 27 U/L (ref 15–41)
Albumin: 3.6 g/dL (ref 3.5–5.0)
Alkaline Phosphatase: 104 U/L (ref 38–126)
Anion gap: 11 (ref 5–15)
BUN: 8 mg/dL (ref 6–20)
CO2: 26 mmol/L (ref 22–32)
Calcium: 8.2 mg/dL — ABNORMAL LOW (ref 8.9–10.3)
Chloride: 105 mmol/L (ref 98–111)
Creatinine, Ser: 0.92 mg/dL (ref 0.61–1.24)
GFR calc Af Amer: >60 mL/min (ref >60)
GFR calc non Af Amer: >60 mL/min (ref >60)
Glucose, Bld: 134 mg/dL — ABNORMAL HIGH (ref 70–99)
Potassium: 3.4 mmol/L — ABNORMAL LOW (ref 3.5–5.1)
Sodium: 142 mmol/L (ref 135–145)
Total Bilirubin: 0.3 mg/dL (ref 0.3–1.2)
Total Protein: 6.6 g/dL (ref 6.5–8.1)

## 2018-08-12 LAB — ETHANOL: Alcohol, Ethyl (B): 396 mg/dL (ref <10)

## 2018-08-12 LAB — ACETAMINOPHEN LEVEL: Acetaminophen (Tylenol), Serum: <10 ug/mL — ABNORMAL LOW (ref 10–30)

## 2018-08-12 NOTE — ED Provider Notes (Signed)
[facility] Emergency Department Provider Note   ____________________________________________   First MD Initiated Contact with Patient 08/12/18 2023     (approximate)  I have reviewed the triage vital signs and the nursing notes.   HISTORY  Chief Complaint Alcohol Intoxication History limited by alcohol intoxication  HPI Kurt Horton is a 58 y.o. male who comes in apparently intoxicated smelling of alcohol.  EMS reports he has stool on his close.  He has multiple previous ER visits for intoxication.  He is asking for something for back pain even though he is apparently intoxicated.  Then he says he feels fine.  He cannot say how much he drank.     Past Medical History:  Diagnosis Date  . A-fib (HCC)   . Alcohol abuse   . Alcohol withdrawal (HCC) 11/12/2016  . Diabetes mellitus without complication (HCC)   . DJD (degenerative joint disease)   . Hypertension   . Myasthenia gravis (HCC)   . Renal disorder     Patient Active Problem List   Diagnosis Date Noted  . Tobacco use disorder 05/27/2018  . GI bleed 02/04/2018  . Alcohol intoxication (HCC) 12/22/2017  . Malnutrition of moderate degree 10/04/2017  . Chronic combined systolic and diastolic CHF (congestive heart failure) (HCC) 08/13/2017  . Alcohol withdrawal delirium (HCC) 02/26/2017  . Atrial fibrillation with RVR (HCC) 02/25/2017  . Acute alcoholic intoxication with complication (HCC)   . Alcohol use disorder, severe, dependence (HCC)   . Substance induced mood disorder (HCC) 11/13/2016  . SVT (supraventricular tachycardia) (HCC) 11/12/2016  . HTN (hypertension) 11/12/2016  . EtOH dependence (HCC) 11/12/2016  . Lumbar radiculopathy 08/24/2016  . Major depressive disorder, recurrent severe without psychotic features (HCC) 08/24/2016  . Chronic low back pain 01/04/2016  . Pressure ulcer 12/29/2015  . Osteomyelitis (HCC) 12/26/2015  . PVD (peripheral vascular disease) (HCC)  12/26/2015  . Type II diabetes mellitus with manifestations (HCC) 12/26/2015  . Myasthenia gravis (HCC) 12/26/2015    Past Surgical History:  Procedure Laterality Date  . AMPUTATION TOE Left 12/29/2015   Procedure: AMPUTATION TOE;  Surgeon: Gwyneth Revels, DPM;  Location: [facility] ORS;  Service: Podiatry;  Laterality: Left;  . ESOPHAGOGASTRODUODENOSCOPY N/A 02/05/2018   Procedure: ESOPHAGOGASTRODUODENOSCOPY (EGD);  Surgeon: Pasty Spillers, MD;  Location: [facility] ENDOSCOPY;  Service: Endoscopy;  Laterality: N/A;  . PERIPHERAL VASCULAR CATHETERIZATION N/A 12/27/2015   Procedure: Lower Extremity Angiography;  Surgeon: Annice Needy, MD;  Location: [facility] INVASIVE CV LAB;  Service: Cardiovascular;  Laterality: N/A;  . right hip surgery    . right shoulder surgery      Prior to Admission medications   Medication Sig Start Date End Date Taking? Authorizing Provider  B Complex-C (B-COMPLEX WITH VITAMIN C) tablet Take 1 tablet by mouth daily. 11/02/17   Alford Highland, MD  cloNIDine (CATAPRES - DOSED IN MG/24 HR) 0.1 mg/24hr patch Place 1 patch (0.1 mg total) onto the skin once a week. Patient not taking: Reported on 06/12/2018 05/30/18   Pucilowska, Ellin Goodie, MD  diltiazem (CARDIZEM CD) 120 MG 24 hr capsule Take 1 capsule (120 mg total) by mouth daily. 06/29/18   Altamese Dilling, MD  folic acid (FOLVITE) 1 MG tablet Take 1 tablet (1 mg total) by mouth daily. 06/29/18   Altamese Dilling, MD  gabapentin (NEURONTIN) 300 MG capsule Take 2 capsules (600 mg total) by mouth 3 (three) times daily. 05/30/18   Pucilowska, Ellin Goodie, MD  LORazepam (ATIVAN) 1 MG tablet Take  1 tablet (1 mg total) by mouth every 8 (eight) hours as needed for anxiety. 06/28/18   Altamese Dilling, MD  metFORMIN (GLUCOPHAGE) 500 MG tablet Take 1 tablet (500 mg total) by mouth daily. 05/30/18   Pucilowska, Braulio Conte B, MD  metoprolol succinate (TOPROL-XL) 50 MG 24 hr tablet Take 1 tablet (50 mg total) by mouth daily. Take with  or immediately following a meal. 05/30/18   Pucilowska, Jolanta B, MD  multivitamin (ONE-A-DAY MEN'S) TABS tablet Take 1 tablet by mouth daily. 04/19/18   Shaune Pollack, MD  mycophenolate (CELLCEPT) 500 MG tablet Take 1 tablet (500 mg total) by mouth 2 (two) times daily. Take 1 tablet (500MG ) by mouth every morning and 2 tablets (1000MG ) by mouth every evening Patient taking differently: Take 500-1,000 mg by mouth See admin instructions. Take 1 tablet (500MG ) by mouth every morning and 2 tablets (1000MG ) by mouth every evening  05/30/18   Pucilowska, Jolanta B, MD  senna-docusate (SENOKOT-S) 8.6-50 MG tablet Take 1 tablet by mouth at bedtime as needed for mild constipation. Patient taking differently: Take 1 tablet by mouth daily.  12/25/17   Mayo, Allyn Kenner, MD  testosterone cypionate (DEPOTESTOSTERONE CYPIONATE) 200 MG/ML injection Inject 200 mg into the muscle every 14 (fourteen) days.    [provider]  thiamine 100 MG tablet Take 1 tablet (100 mg total) by mouth daily. 06/29/18   Altamese Dilling, MD  traZODone (DESYREL) 100 MG tablet Take 1 tablet (100 mg total) by mouth at bedtime. 05/30/18   Pucilowska, Ellin Goodie, MD    Allergies Nsaids and Other  Family History  Problem Relation Age of Onset  . Rheum arthritis Father     Social History Social History   Tobacco Use  . Smoking status: Current Every Day Smoker    Packs/day: 0.50    Types: Cigarettes  . Smokeless tobacco: Never Used  Substance Use Topics  . Alcohol use: Yes    Alcohol/week: 20.0 - 30.0 standard drinks    Types: 20 - 30 Shots of liquor per week    Comment: Last drink 3 days ago 06/22/2018- rum  . Drug use: Yes    Types: Oxycodone    Review of Systems  Unable to obtain due to intoxication   ____________________________________________   PHYSICAL EXAM:  VITAL SIGNS: ED Triage Vitals  Enc Vitals Group     BP --      Pulse --      Resp --      Temp --      Temp src --      SpO2 --       Weight 08/12/18 2024 180 lb (81.6 kg)     Height 08/12/18 2024 5\' 9"  (1.753 m)     Head Circumference --      Peak Flow --      Pain Score 08/12/18 2023 8     Pain Loc --      Pain Edu? --      Excl. in GC? --     Constitutional: Sleepy but easily arousable well appearing and in no acute distress. Eyes: Conjunctivae are normal.  Head: Atraumatic. Nose: No congestion/rhinnorhea. Mouth/Throat: Mucous membranes are moist.  Oropharynx non-erythematous. Neck: No stridor.  Cardiovascular: Normal rate, regular rhythm. Grossly normal heart sounds.  Good peripheral circulation. Respiratory: Normal respiratory effort.  No retractions. Lungs CTAB. Gastrointestinal: Soft and nontender. No distention. No abdominal bruits. No CVA tenderness. Musculoskeletal: No lower extremity tenderness nor edema.  Neurologic:  Normal speech and language. No gross focal neurologic deficits are appreciated.  Skin:  Skin is warm, dry and intact. No rash noted.   ____________________________________________   LABS (all labs ordered are listed, but only abnormal results are displayed)  Labs Reviewed  COMPREHENSIVE METABOLIC PANEL - Abnormal; Notable for the following components:      Result Value   Potassium 3.4 (*)    Glucose, Bld 134 (*)    Calcium 8.2 (*)    All other components within normal limits  ETHANOL - Abnormal; Notable for the following components:   Alcohol, Ethyl (B) 396 (*)    All other components within normal limits  ACETAMINOPHEN LEVEL - Abnormal; Notable for the following components:   Acetaminophen (Tylenol), Serum <10 (*)    All other components within normal limits  CBC WITH DIFFERENTIAL/PLATELET - Abnormal; Notable for the following components:   RDW 18.6 (*)    Neutro Abs 1.6 (*)    All other components within normal limits  SALICYLATE LEVEL  URINALYSIS, COMPLETE (UACMP) WITH MICROSCOPIC  URINE DRUG SCREEN, QUALITATIVE ([facility] ONLY)   ____________________________________________   EKG   ____________________________________________  RADIOLOGY  ED MD interpretation: X-ray read by radiology reviewed by me shows no acute disease  Official radiology report(s): Dg Chest Portable 1 View  Result Date: 08/12/2018 CLINICAL DATA:  Alcohol intoxication.  Hypertension. EXAM: PORTABLE CHEST 1 VIEW COMPARISON:  June 30, 2018 FINDINGS: No edema or consolidation. Heart size and pulmonary vascularity are normal. No adenopathy. There is upper thoracic levoscoliosis. IMPRESSION: No edema or consolidation. Electronically Signed   By: Bretta Bang III M.D.   On: 08/12/2018 20:47    ____________________________________________   PROCEDURES  Procedure(s) performed (including Critical Care):  Procedures   ____________________________________________   INITIAL IMPRESSION / ASSESSMENT AND PLAN / ED COURSE  Patient is intoxicated we will monitor him closely and wait to see if he improves           ____________________________________________   FINAL CLINICAL IMPRESSION(S) / ED DIAGNOSES  Final diagnoses:  Alcoholic intoxication without complication [facility])     ED Discharge Orders    None       Note:  This document was prepared using Dragon voice recognition software and may include unintentional dictation errors.    Arnaldo Natal, MD 08/12/18 [phone]

## 2018-08-12 NOTE — ED Triage Notes (Signed)
Pt to ED via ems from home with alcohol intoxication. Pt states he drinks 20-30 air plane bottles of liquor a day. Pt is arousable to voice, a&o x4. VSS.

## 2018-08-13 MED ORDER — NICOTINE 21 MG/24HR TD PT24
21.0000 mg | MEDICATED_PATCH | Freq: Once | TRANSDERMAL | Status: DC
  Administered 2018-08-13: 21 mg via TRANSDERMAL
  Filled 2018-08-13: qty 1

## 2018-08-15 ENCOUNTER — Emergency Department
Admission: EM | Admit: 2018-08-15 | Discharge: 2018-08-16 | Disposition: A | Discharge status: Home / Self Care | Payer: Medicare Other | Source: Home / Self Care | Attending: Emergency Medicine | Admitting: Emergency Medicine

## 2018-08-15 DIAGNOSIS — I5042 Chronic combined systolic (congestive) and diastolic (congestive) heart failure: Secondary | ICD-10-CM

## 2018-08-15 DIAGNOSIS — E119 Type 2 diabetes mellitus without complications: Secondary | ICD-10-CM | POA: Insufficient documentation

## 2018-08-15 DIAGNOSIS — Z79899 Other long term (current) drug therapy: Secondary | ICD-10-CM

## 2018-08-15 DIAGNOSIS — I110 Hypertensive heart disease with heart failure: Secondary | ICD-10-CM | POA: Insufficient documentation

## 2018-08-15 DIAGNOSIS — F10239 Alcohol dependence with withdrawal, unspecified: Secondary | ICD-10-CM | POA: Diagnosis not present

## 2018-08-15 DIAGNOSIS — Y908 Blood alcohol level of 240 mg/100 ml or more: Secondary | ICD-10-CM | POA: Insufficient documentation

## 2018-08-15 DIAGNOSIS — F10920 Alcohol use, unspecified with intoxication, uncomplicated: Secondary | ICD-10-CM

## 2018-08-15 DIAGNOSIS — F17210 Nicotine dependence, cigarettes, uncomplicated: Secondary | ICD-10-CM

## 2018-08-15 LAB — CBC
HCT: 48.6 % (ref 39.0–52.0)
Hemoglobin: 15.4 g/dL (ref 13.0–17.0)
MCH: 28.5 pg (ref 26.0–34.0)
MCHC: 31.7 g/dL (ref 30.0–36.0)
MCV: 90 fL (ref 80.0–100.0)
Platelets: 219 10*3/uL (ref 150–400)
RBC: 5.4 MIL/uL (ref 4.22–5.81)
RDW: 19.6 % — ABNORMAL HIGH (ref 11.5–15.5)
WBC: 9 10*3/uL (ref 4.0–10.5)
nRBC: 0.2 % (ref 0.0–0.2)

## 2018-08-15 LAB — COMPREHENSIVE METABOLIC PANEL
ALT: 12 U/L (ref 0–44)
AST: 38 U/L (ref 15–41)
Albumin: 3.6 g/dL (ref 3.5–5.0)
Alkaline Phosphatase: 133 U/L — ABNORMAL HIGH (ref 38–126)
Anion gap: 16 — ABNORMAL HIGH (ref 5–15)
BUN: 8 mg/dL (ref 6–20)
CO2: 21 mmol/L — ABNORMAL LOW (ref 22–32)
Calcium: 7.9 mg/dL — ABNORMAL LOW (ref 8.9–10.3)
Chloride: 103 mmol/L (ref 98–111)
Creatinine, Ser: 0.82 mg/dL (ref 0.61–1.24)
GFR calc Af Amer: >60 mL/min (ref >60)
GFR calc non Af Amer: >60 mL/min (ref >60)
Glucose, Bld: 179 mg/dL — ABNORMAL HIGH (ref 70–99)
Potassium: 4.8 mmol/L (ref 3.5–5.1)
Sodium: 140 mmol/L (ref 135–145)
Total Bilirubin: 0.9 mg/dL (ref 0.3–1.2)
Total Protein: 7.3 g/dL (ref 6.5–8.1)

## 2018-08-15 LAB — ETHANOL: Alcohol, Ethyl (B): 400 mg/dL (ref <10)

## 2018-08-15 MED ORDER — SODIUM CHLORIDE 0.9 % IV BOLUS
1000.0000 mL | Freq: Once | INTRAVENOUS | Status: AC
  Administered 2018-08-15: 1000 mL via INTRAVENOUS

## 2018-08-15 NOTE — ED Notes (Addendum)
This RN and Research scientist (life sciences)  Counted patient's cash, $180 in total. Labeled and locked in safe. Belonging's placed in locker #14. Key locked in pixis in ED

## 2018-08-15 NOTE — ED Notes (Signed)
Patient's oxygen saturation decreased to 89% on RA. Patient placed on 3L [place]. RN will continue to monitor.

## 2018-08-15 NOTE — ED Triage Notes (Signed)
Patient coming [facility] from parents home for LOC. Patient with known hx of alcohol intoxicaiton. Patient had 3 airplane size bottles of bacardi in pockets. Patient reports cough, sore throat, denies SOB.   EMS vitals: HR 106, O2 saturation 88-90% on RA, CBG 203, BP 154/99.

## 2018-08-15 NOTE — ED Provider Notes (Signed)
[facility] Emergency Department Provider Note  Time seen: 10:09 PM  I have reviewed the triage vital signs and the nursing notes.   HISTORY  Chief Complaint Loss of Consciousness   HPI Kurt Horton is a 58 y.o. male with a past medical history of atrial fibrillation, alcohol abuse, hypertension, presents to the emergency department with significant intoxication.  According EMS they were called out to the patient's residence for severe intoxication.  Patient is a chronic alcoholic well-known to the emergency department.  EMS states that patient per report was diagnosed with influenza within the past 2 days at [facility], placed on contact and droplet precautions upon arrival.  Patient is extremely intoxicated upon arrival, is unable to form a coherent sentence, does awaken to voice or mild physical stimuli, but falls asleep shortly after.  Unable to provide any history or review of systems.  Patient does admit to drinking alcohol.  Past Medical History:  Diagnosis Date  . A-fib (HCC)   . Alcohol abuse   . Alcohol withdrawal (HCC) 11/12/2016  . Diabetes mellitus without complication (HCC)   . DJD (degenerative joint disease)   . Hypertension   . Myasthenia gravis (HCC)   . Renal disorder     Patient Active Problem List   Diagnosis Date Noted  . Tobacco use disorder 05/27/2018  . GI bleed 02/04/2018  . Alcohol intoxication (HCC) 12/22/2017  . Malnutrition of moderate degree 10/04/2017  . Chronic combined systolic and diastolic CHF (congestive heart failure) (HCC) 08/13/2017  . Alcohol withdrawal delirium (HCC) 02/26/2017  . Atrial fibrillation with RVR (HCC) 02/25/2017  . Acute alcoholic intoxication with complication (HCC)   . Alcohol use disorder, severe, dependence (HCC)   . Substance induced mood disorder (HCC) 11/13/2016  . SVT (supraventricular tachycardia) (HCC) 11/12/2016  . HTN (hypertension) 11/12/2016  . EtOH dependence (HCC) 11/12/2016   . Lumbar radiculopathy 08/24/2016  . Major depressive disorder, recurrent severe without psychotic features (HCC) 08/24/2016  . Chronic low back pain 01/04/2016  . Pressure ulcer 12/29/2015  . Osteomyelitis (HCC) 12/26/2015  . PVD (peripheral vascular disease) (HCC) 12/26/2015  . Type II diabetes mellitus with manifestations (HCC) 12/26/2015  . Myasthenia gravis (HCC) 12/26/2015    Past Surgical History:  Procedure Laterality Date  . AMPUTATION TOE Left 12/29/2015   Procedure: AMPUTATION TOE;  Surgeon: Gwyneth Revels, DPM;  Location: [facility] ORS;  Service: Podiatry;  Laterality: Left;  . ESOPHAGOGASTRODUODENOSCOPY N/A 02/05/2018   Procedure: ESOPHAGOGASTRODUODENOSCOPY (EGD);  Surgeon: Pasty Spillers, MD;  Location: [facility] ENDOSCOPY;  Service: Endoscopy;  Laterality: N/A;  . PERIPHERAL VASCULAR CATHETERIZATION N/A 12/27/2015   Procedure: Lower Extremity Angiography;  Surgeon: Annice Needy, MD;  Location: [facility] INVASIVE CV LAB;  Service: Cardiovascular;  Laterality: N/A;  . right hip surgery    . right shoulder surgery      Prior to Admission medications   Medication Sig Start Date End Date Taking? Authorizing Provider  B Complex-C (B-COMPLEX WITH VITAMIN C) tablet Take 1 tablet by mouth daily. 11/02/17   Alford Highland, MD  cloNIDine (CATAPRES - DOSED IN MG/24 HR) 0.1 mg/24hr patch Place 1 patch (0.1 mg total) onto the skin once a week. Patient not taking: Reported on 06/12/2018 05/30/18   Pucilowska, Ellin Goodie, MD  diltiazem (CARDIZEM CD) 120 MG 24 hr capsule Take 1 capsule (120 mg total) by mouth daily. 06/29/18   Altamese Dilling, MD  folic acid (FOLVITE) 1 MG tablet Take 1 tablet (1 mg total) by mouth  daily. 06/29/18   Altamese DillingVachhani, Vaibhavkumar, MD  gabapentin (NEURONTIN) 300 MG capsule Take 2 capsules (600 mg total) by mouth 3 (three) times daily. 05/30/18   Pucilowska, Jolanta B, MD  LORazepam (ATIVAN) 1 MG tablet Take 1 tablet (1 mg total) by mouth every 8 (eight) hours as needed for  anxiety. 06/28/18   Altamese DillingVachhani, Vaibhavkumar, MD  metFORMIN (GLUCOPHAGE) 500 MG tablet Take 1 tablet (500 mg total) by mouth daily. 05/30/18   Pucilowska, Braulio ConteJolanta B, MD  metoprolol succinate (TOPROL-XL) 50 MG 24 hr tablet Take 1 tablet (50 mg total) by mouth daily. Take with or immediately following a meal. 05/30/18   Pucilowska, Jolanta B, MD  multivitamin (ONE-A-DAY MEN'S) TABS tablet Take 1 tablet by mouth daily. 04/19/18   Shaune Pollackhen, Qing, MD  mycophenolate (CELLCEPT) 500 MG tablet Take 1 tablet (500 mg total) by mouth 2 (two) times daily. Take 1 tablet (500MG ) by mouth every morning and 2 tablets (1000MG ) by mouth every evening Patient taking differently: Take 500-1,000 mg by mouth See admin instructions. Take 1 tablet (500MG ) by mouth every morning and 2 tablets (1000MG ) by mouth every evening  05/30/18   Pucilowska, Jolanta B, MD  senna-docusate (SENOKOT-S) 8.6-50 MG tablet Take 1 tablet by mouth at bedtime as needed for mild constipation. Patient taking differently: Take 1 tablet by mouth daily.  12/25/17   Mayo, Allyn KennerKaty Dodd, MD  testosterone cypionate (DEPOTESTOSTERONE CYPIONATE) 200 MG/ML injection Inject 200 mg into the muscle every 14 (fourteen) days.    [provider]  thiamine 100 MG tablet Take 1 tablet (100 mg total) by mouth daily. 06/29/18   Altamese DillingVachhani, Vaibhavkumar, MD  traZODone (DESYREL) 100 MG tablet Take 1 tablet (100 mg total) by mouth at bedtime. 05/30/18   Pucilowska, Ellin GoodieJolanta B, MD    Allergies  Allergen Reactions  . Nsaids Other (See Comments)    Duodenal ulcers, GI bleeding  . Other Other (See Comments)    Patient states he can't take any "mycin" medications because myasthenia gravis.    Family History  Problem Relation Age of Onset  . Rheum arthritis Father     Social History Social History   Tobacco Use  . Smoking status: Current Every Day Smoker    Packs/day: 0.50    Types: Cigarettes  . Smokeless tobacco: Never Used  Substance Use Topics  . Alcohol use: Yes     Alcohol/week: 20.0 - 30.0 standard drinks    Types: 20 - 30 Shots of liquor per week    Comment: Last drink 3 days ago 06/22/2018- rum  . Drug use: Yes    Types: Oxycodone    Review of Systems Unable to complete an adequate/accurate review of systems secondary to significant alcohol intoxication ____________________________________________   PHYSICAL EXAM:  VITAL SIGNS: ED Triage Vitals  Enc Vitals Group     BP 08/15/18 2202 (!) 154/99     Pulse Rate 08/15/18 2202 (!) 105     Resp 08/15/18 2202 16     Temp 08/15/18 2202 98.4 F (36.9 C)     Temp Source 08/15/18 2202 Oral     SpO2 08/15/18 2202 99 %     Weight 08/15/18 2203 180 lb 12.4 oz (82 kg)     Height --      Head Circumference --      Peak Flow --      Pain Score 08/15/18 2203 0     Pain Loc --      Pain Edu? --  Excl. in GC? --    Constitutional: Alert and oriented. Well appearing and in no distress. Eyes: Normal exam ENT   Head: Normocephalic and atraumatic   Mouth/Throat: Mucous membranes are moist. Cardiovascular: Normal rate, regular rhythm.  Respiratory: Normal respiratory effort without tachypnea nor retractions. Breath sounds are clear  Gastrointestinal: Soft and nontender. No distention.  Musculoskeletal: Nontender with normal range of motion in all extremities.  Neurologic:  Normal speech and language. No gross focal neurologic deficits  Skin:  Skin is warm, dry and intact.  Psychiatric: Mood and affect are normal.  ____________________________________________   INITIAL IMPRESSION / ASSESSMENT AND PLAN / ED COURSE  Pertinent labs & imaging results that were available during my care of the patient were reviewed by me and considered in my medical decision making (see chart for details).  Patient presents to the emergency department by EMS for severe intoxication.  Also per report patient recently tested positive for influenza at [facility].  Upon arrival patient is extremely  intoxicated appearing, extremely slurred speech unable to form a coherent sentence.  We will check labs including alcohol level.  I reviewed the patient's records it appears he was diagnosed influenza at the end of February.    Patient's lab work has resulted showing significant blood ethanol level of 400.  Patient will need to sober in the emergency department prior to discharge.  Patient care signed out to Dr. Manson Passey.  ____________________________________________   FINAL CLINICAL IMPRESSION(S) / ED DIAGNOSES  Alcohol intoxication   Minna Antis, MD 08/16/18 1535

## 2018-08-15 NOTE — ED Notes (Signed)
Ed provider at bedside

## 2018-08-16 NOTE — ED Notes (Signed)
Pt belongings given back to pt. Pt 180 dollars returned from safe to pt. Pt in NAD at time of D/C. Pt escorted to lobby and awaiting cab to arrive. First nurse Lisa,RN aware.

## 2018-08-16 NOTE — ED Notes (Signed)
Condom cath placed by Erie Noe RN with this RN as a witness

## 2018-08-17 ENCOUNTER — Encounter: Payer: Self-pay | Admitting: Internal Medicine

## 2018-08-17 ENCOUNTER — Inpatient Hospital Stay
Admission: EM | Admit: 2018-08-17 | Discharge: 2018-08-20 | DRG: 897 | Disposition: A | Discharge status: Home / Self Care | Payer: Medicare Other | Attending: Internal Medicine | Admitting: Internal Medicine

## 2018-08-17 ENCOUNTER — Other Ambulatory Visit: Payer: Self-pay

## 2018-08-17 DIAGNOSIS — Y908 Blood alcohol level of 240 mg/100 ml or more: Secondary | ICD-10-CM | POA: Diagnosis present

## 2018-08-17 DIAGNOSIS — E1142 Type 2 diabetes mellitus with diabetic polyneuropathy: Secondary | ICD-10-CM | POA: Diagnosis present

## 2018-08-17 DIAGNOSIS — G8929 Other chronic pain: Secondary | ICD-10-CM | POA: Diagnosis present

## 2018-08-17 DIAGNOSIS — Z888 Allergy status to other drugs, medicaments and biological substances status: Secondary | ICD-10-CM | POA: Diagnosis not present

## 2018-08-17 DIAGNOSIS — Z8261 Family history of arthritis: Secondary | ICD-10-CM

## 2018-08-17 DIAGNOSIS — G7000 Myasthenia gravis without (acute) exacerbation: Secondary | ICD-10-CM | POA: Diagnosis present

## 2018-08-17 DIAGNOSIS — F10920 Alcohol use, unspecified with intoxication, uncomplicated: Secondary | ICD-10-CM

## 2018-08-17 DIAGNOSIS — Z79899 Other long term (current) drug therapy: Secondary | ICD-10-CM

## 2018-08-17 DIAGNOSIS — D649 Anemia, unspecified: Secondary | ICD-10-CM | POA: Diagnosis present

## 2018-08-17 DIAGNOSIS — Z7984 Long term (current) use of oral hypoglycemic drugs: Secondary | ICD-10-CM | POA: Diagnosis not present

## 2018-08-17 DIAGNOSIS — Z79890 Hormone replacement therapy (postmenopausal): Secondary | ICD-10-CM | POA: Diagnosis not present

## 2018-08-17 DIAGNOSIS — M549 Dorsalgia, unspecified: Secondary | ICD-10-CM | POA: Diagnosis present

## 2018-08-17 DIAGNOSIS — Z9119 Patient's noncompliance with other medical treatment and regimen: Secondary | ICD-10-CM | POA: Diagnosis not present

## 2018-08-17 DIAGNOSIS — I480 Paroxysmal atrial fibrillation: Secondary | ICD-10-CM | POA: Diagnosis present

## 2018-08-17 DIAGNOSIS — F1010 Alcohol abuse, uncomplicated: Secondary | ICD-10-CM

## 2018-08-17 DIAGNOSIS — F10220 Alcohol dependence with intoxication, uncomplicated: Secondary | ICD-10-CM | POA: Diagnosis present

## 2018-08-17 DIAGNOSIS — E876 Hypokalemia: Secondary | ICD-10-CM | POA: Diagnosis present

## 2018-08-17 DIAGNOSIS — Z89422 Acquired absence of other left toe(s): Secondary | ICD-10-CM

## 2018-08-17 DIAGNOSIS — I10 Essential (primary) hypertension: Secondary | ICD-10-CM | POA: Diagnosis present

## 2018-08-17 DIAGNOSIS — F10239 Alcohol dependence with withdrawal, unspecified: Secondary | ICD-10-CM

## 2018-08-17 DIAGNOSIS — F17210 Nicotine dependence, cigarettes, uncomplicated: Secondary | ICD-10-CM | POA: Diagnosis present

## 2018-08-17 DIAGNOSIS — Z886 Allergy status to analgesic agent status: Secondary | ICD-10-CM

## 2018-08-17 DIAGNOSIS — M1990 Unspecified osteoarthritis, unspecified site: Secondary | ICD-10-CM | POA: Diagnosis present

## 2018-08-17 DIAGNOSIS — R197 Diarrhea, unspecified: Secondary | ICD-10-CM | POA: Diagnosis present

## 2018-08-17 DIAGNOSIS — I4891 Unspecified atrial fibrillation: Secondary | ICD-10-CM

## 2018-08-17 DIAGNOSIS — F10939 Alcohol use, unspecified with withdrawal, unspecified: Secondary | ICD-10-CM

## 2018-08-17 LAB — CBC WITH DIFFERENTIAL/PLATELET
Abs Immature Granulocytes: 0.03 10*3/uL (ref 0.00–0.07)
Basophils Absolute: 0.1 10*3/uL (ref 0.0–0.1)
Basophils Relative: 2 %
Eosinophils Absolute: 0.1 10*3/uL (ref 0.0–0.5)
Eosinophils Relative: 1 %
HCT: 46.7 % (ref 39.0–52.0)
Hemoglobin: 15.1 g/dL (ref 13.0–17.0)
Immature Granulocytes: 1 %
Lymphocytes Relative: 42 %
Lymphs Abs: 2.2 10*3/uL (ref 0.7–4.0)
MCH: 29.1 pg (ref 26.0–34.0)
MCHC: 32.3 g/dL (ref 30.0–36.0)
MCV: 90 fL (ref 80.0–100.0)
Monocytes Absolute: 0.5 10*3/uL (ref 0.1–1.0)
Monocytes Relative: 10 %
Neutro Abs: 2.4 10*3/uL (ref 1.7–7.7)
Neutrophils Relative %: 44 %
Platelets: 181 10*3/uL (ref 150–400)
RBC: 5.19 MIL/uL (ref 4.22–5.81)
RDW: 19.4 % — ABNORMAL HIGH (ref 11.5–15.5)
WBC: 5.3 10*3/uL (ref 4.0–10.5)
nRBC: 0 % (ref 0.0–0.2)

## 2018-08-17 LAB — COMPREHENSIVE METABOLIC PANEL
ALT: 14 U/L (ref 0–44)
AST: 43 U/L — ABNORMAL HIGH (ref 15–41)
Albumin: 3.2 g/dL — ABNORMAL LOW (ref 3.5–5.0)
Alkaline Phosphatase: 104 U/L (ref 38–126)
Anion gap: 13 (ref 5–15)
BUN: <5 mg/dL — ABNORMAL LOW (ref 6–20)
CO2: 29 mmol/L (ref 22–32)
Calcium: 8 mg/dL — ABNORMAL LOW (ref 8.9–10.3)
Chloride: 100 mmol/L (ref 98–111)
Creatinine, Ser: 0.84 mg/dL (ref 0.61–1.24)
GFR calc Af Amer: >60 mL/min (ref >60)
GFR calc non Af Amer: >60 mL/min (ref >60)
Glucose, Bld: 128 mg/dL — ABNORMAL HIGH (ref 70–99)
Potassium: 3.9 mmol/L (ref 3.5–5.1)
Sodium: 142 mmol/L (ref 135–145)
Total Bilirubin: 1.3 mg/dL — ABNORMAL HIGH (ref 0.3–1.2)
Total Protein: 6.6 g/dL (ref 6.5–8.1)

## 2018-08-17 LAB — ETHANOL: Alcohol, Ethyl (B): 331 mg/dL (ref <10)

## 2018-08-17 LAB — GLUCOSE, CAPILLARY: Glucose-Capillary: 130 mg/dL — ABNORMAL HIGH (ref 70–99)

## 2018-08-17 LAB — MAGNESIUM: Magnesium: 1.8 mg/dL (ref 1.7–2.4)

## 2018-08-17 MED ORDER — LACTATED RINGERS IV SOLN
INTRAVENOUS | Status: DC
  Administered 2018-08-17 – 2018-08-20 (×5): via INTRAVENOUS

## 2018-08-17 MED ORDER — LORAZEPAM 2 MG/ML IJ SOLN
2.0000 mg | Freq: Once | INTRAMUSCULAR | Status: AC
  Administered 2018-08-17: 20:00:00 2 mg via INTRAVENOUS
  Filled 2018-08-17: qty 1

## 2018-08-17 MED ORDER — ONDANSETRON HCL 4 MG/2ML IJ SOLN
4.0000 mg | Freq: Four times a day (QID) | INTRAMUSCULAR | Status: DC | PRN
  Filled 2018-08-17: qty 2

## 2018-08-17 MED ORDER — ENOXAPARIN SODIUM 40 MG/0.4ML ~~LOC~~ SOLN
40.0000 mg | SUBCUTANEOUS | Status: DC
  Administered 2018-08-17 – 2018-08-19 (×3): 40 mg via SUBCUTANEOUS
  Filled 2018-08-17 (×3): qty 0.4

## 2018-08-17 MED ORDER — LORAZEPAM 0.5 MG PO TABS
0.5000 mg | ORAL_TABLET | ORAL | Status: AC
  Filled 2018-08-17: qty 1

## 2018-08-17 MED ORDER — THIAMINE HCL 100 MG/ML IJ SOLN
100.0000 mg | Freq: Every day | INTRAMUSCULAR | Status: DC
  Administered 2018-08-18 – 2018-08-19 (×2): 100 mg via INTRAVENOUS
  Filled 2018-08-17 (×2): qty 2

## 2018-08-17 MED ORDER — METOPROLOL TARTRATE 5 MG/5ML IV SOLN
10.0000 mg | INTRAVENOUS | Status: DC | PRN
  Administered 2018-08-17: 10 mg via INTRAVENOUS
  Filled 2018-08-17: qty 10

## 2018-08-17 MED ORDER — LORAZEPAM 2 MG/ML IJ SOLN
1.0000 mg | Freq: Four times a day (QID) | INTRAMUSCULAR | Status: DC | PRN
  Administered 2018-08-17 – 2018-08-19 (×3): 1 mg via INTRAVENOUS
  Filled 2018-08-17: qty 0.5
  Filled 2018-08-17: qty 1

## 2018-08-17 MED ORDER — VITAMIN B-1 100 MG PO TABS: 100.0000 mg | ORAL_TABLET | Freq: Every day | ORAL | Status: DC

## 2018-08-17 MED ORDER — LABETALOL HCL 5 MG/ML IV SOLN: 10.0000 mg | INTRAVENOUS | Status: DC | PRN

## 2018-08-17 MED ORDER — DILTIAZEM HCL ER COATED BEADS 120 MG PO CP24
120.0000 mg | ORAL_CAPSULE | Freq: Every day | ORAL | Status: DC
  Administered 2018-08-17 – 2018-08-20 (×4): 120 mg via ORAL
  Filled 2018-08-17 (×4): qty 1

## 2018-08-17 MED ORDER — ALBUTEROL SULFATE (2.5 MG/3ML) 0.083% IN NEBU
2.5000 mg | INHALATION_SOLUTION | RESPIRATORY_TRACT | Status: DC | PRN
  Filled 2018-08-17: qty 3

## 2018-08-17 MED ORDER — VITAMIN B-1 100 MG PO TABS
100.0000 mg | ORAL_TABLET | Freq: Every day | ORAL | Status: DC
  Administered 2018-08-20: 100 mg via ORAL
  Filled 2018-08-17 (×2): qty 1

## 2018-08-17 MED ORDER — ACETAMINOPHEN 325 MG PO TABS
650.0000 mg | ORAL_TABLET | Freq: Four times a day (QID) | ORAL | Status: DC | PRN
  Filled 2018-08-17: qty 2

## 2018-08-17 MED ORDER — LORAZEPAM 2 MG/ML IJ SOLN: 0.0000 mg | Freq: Two times a day (BID) | INTRAMUSCULAR | Status: DC

## 2018-08-17 MED ORDER — GABAPENTIN 300 MG PO CAPS
600.0000 mg | ORAL_CAPSULE | Freq: Three times a day (TID) | ORAL | Status: DC
  Administered 2018-08-17 – 2018-08-20 (×8): 600 mg via ORAL
  Filled 2018-08-17 (×7): qty 2

## 2018-08-17 MED ORDER — LORAZEPAM 2 MG PO TABS
0.0000 mg | ORAL_TABLET | Freq: Two times a day (BID) | ORAL | Status: DC
  Administered 2018-08-20: 2 mg via ORAL
  Filled 2018-08-17: qty 1

## 2018-08-17 MED ORDER — METOPROLOL SUCCINATE ER 50 MG PO TB24
50.0000 mg | ORAL_TABLET | Freq: Every day | ORAL | Status: DC
  Administered 2018-08-17 – 2018-08-20 (×4): 50 mg via ORAL
  Filled 2018-08-17 (×4): qty 1

## 2018-08-17 MED ORDER — POLYETHYLENE GLYCOL 3350 17 G PO PACK
17.0000 g | PACK | Freq: Every day | ORAL | Status: DC | PRN
  Filled 2018-08-17: qty 1

## 2018-08-17 MED ORDER — THIAMINE HCL 100 MG/ML IJ SOLN: 100.0000 mg | Freq: Every day | INTRAMUSCULAR | Status: DC

## 2018-08-17 MED ORDER — OXYCODONE HCL 5 MG PO TABS
10.0000 mg | ORAL_TABLET | ORAL | Status: DC | PRN
  Administered 2018-08-17 – 2018-08-20 (×12): 10 mg via ORAL
  Filled 2018-08-17 (×13): qty 2

## 2018-08-17 MED ORDER — INSULIN ASPART 100 UNIT/ML ~~LOC~~ SOLN
0.0000 [IU] | Freq: Three times a day (TID) | SUBCUTANEOUS | Status: DC
  Administered 2018-08-19: 13:00:00 2 [IU] via SUBCUTANEOUS
  Administered 2018-08-19 – 2018-08-20 (×2): 1 [IU] via SUBCUTANEOUS
  Filled 2018-08-17 (×4): qty 1

## 2018-08-17 MED ORDER — LORAZEPAM 1 MG PO TABS
1.0000 mg | ORAL_TABLET | Freq: Once | ORAL | Status: AC
  Administered 2018-08-17: 1 mg via ORAL
  Filled 2018-08-17: qty 1

## 2018-08-17 MED ORDER — CHLORDIAZEPOXIDE HCL 25 MG PO CAPS
25.0000 mg | ORAL_CAPSULE | Freq: Three times a day (TID) | ORAL | Status: DC
  Administered 2018-08-17 – 2018-08-20 (×7): 25 mg via ORAL
  Filled 2018-08-17 (×7): qty 1

## 2018-08-17 MED ORDER — SODIUM CHLORIDE 0.9 % IV BOLUS
1000.0000 mL | Freq: Once | INTRAVENOUS | Status: AC
  Administered 2018-08-17: 1000 mL via INTRAVENOUS

## 2018-08-17 MED ORDER — THIAMINE HCL 100 MG/ML IJ SOLN
100.0000 mg | Freq: Once | INTRAMUSCULAR | Status: AC
  Administered 2018-08-17: 100 mg via INTRAVENOUS
  Filled 2018-08-17: qty 2

## 2018-08-17 MED ORDER — MYCOPHENOLATE MOFETIL 250 MG PO CAPS
1000.0000 mg | ORAL_CAPSULE | Freq: Every evening | ORAL | Status: DC
  Administered 2018-08-17 – 2018-08-19 (×3): 1000 mg via ORAL
  Filled 2018-08-17 (×4): qty 4

## 2018-08-17 MED ORDER — MYCOPHENOLATE MOFETIL 250 MG PO CAPS
500.0000 mg | ORAL_CAPSULE | Freq: Every morning | ORAL | Status: DC
  Administered 2018-08-18 – 2018-08-20 (×3): 500 mg via ORAL
  Filled 2018-08-17 (×3): qty 2

## 2018-08-17 MED ORDER — ACETAMINOPHEN 650 MG RE SUPP
650.0000 mg | Freq: Four times a day (QID) | RECTAL | Status: DC | PRN
  Filled 2018-08-17: qty 1

## 2018-08-17 MED ORDER — FOLIC ACID 1 MG PO TABS
1.0000 mg | ORAL_TABLET | Freq: Every day | ORAL | Status: DC
  Administered 2018-08-18 – 2018-08-20 (×3): 1 mg via ORAL
  Filled 2018-08-17 (×3): qty 1

## 2018-08-17 MED ORDER — LORAZEPAM 1 MG PO TABS
1.0000 mg | ORAL_TABLET | ORAL | Status: AC
  Administered 2018-08-17: 16:00:00 1 mg via ORAL
  Filled 2018-08-17: qty 1

## 2018-08-17 MED ORDER — FOLIC ACID 1 MG PO TABS: 1.0000 mg | ORAL_TABLET | Freq: Every day | ORAL | Status: DC

## 2018-08-17 MED ORDER — FOLIC ACID 1 MG PO TABS
1.0000 mg | ORAL_TABLET | Freq: Once | ORAL | Status: AC
  Administered 2018-08-17: 1 mg via ORAL
  Filled 2018-08-17: qty 1

## 2018-08-17 MED ORDER — LORAZEPAM 2 MG/ML IJ SOLN
0.0000 mg | Freq: Four times a day (QID) | INTRAMUSCULAR | Status: AC
  Administered 2018-08-17: 20:00:00 0 mg via INTRAVENOUS
  Administered 2018-08-18 – 2018-08-19 (×2): 1 mg via INTRAVENOUS
  Administered 2018-08-19: 2 mg via INTRAVENOUS
  Administered 2018-08-19: 1 mg via INTRAVENOUS
  Filled 2018-08-17 (×6): qty 1

## 2018-08-17 MED ORDER — MYCOPHENOLATE MOFETIL 500 MG PO TABS: 500.0000 mg | ORAL_TABLET | Freq: Two times a day (BID) | ORAL | Status: DC

## 2018-08-17 MED ORDER — LORAZEPAM 1 MG PO TABS
1.0000 mg | ORAL_TABLET | Freq: Four times a day (QID) | ORAL | Status: DC | PRN
  Administered 2018-08-19: 1 mg via ORAL
  Filled 2018-08-17 (×2): qty 1

## 2018-08-17 MED ORDER — SODIUM CHLORIDE 0.9% FLUSH
3.0000 mL | Freq: Two times a day (BID) | INTRAVENOUS | Status: DC
  Administered 2018-08-17 – 2018-08-20 (×6): 3 mL via INTRAVENOUS

## 2018-08-17 MED ORDER — LOPERAMIDE HCL 2 MG PO CAPS
2.0000 mg | ORAL_CAPSULE | Freq: Once | ORAL | Status: AC
  Administered 2018-08-17: 18:00:00 2 mg via ORAL
  Filled 2018-08-17: qty 1

## 2018-08-17 MED ORDER — LORAZEPAM 2 MG PO TABS
0.0000 mg | ORAL_TABLET | Freq: Four times a day (QID) | ORAL | Status: AC
  Administered 2018-08-18 (×2): 2 mg via ORAL
  Filled 2018-08-17 (×2): qty 1

## 2018-08-17 MED ORDER — ONDANSETRON HCL 4 MG PO TABS
4.0000 mg | ORAL_TABLET | Freq: Four times a day (QID) | ORAL | Status: DC | PRN
  Filled 2018-08-17: qty 1

## 2018-08-17 MED ORDER — ADULT MULTIVITAMIN W/MINERALS CH
1.0000 | ORAL_TABLET | Freq: Every day | ORAL | Status: DC
  Administered 2018-08-17 – 2018-08-20 (×4): 1 via ORAL
  Filled 2018-08-17 (×4): qty 1

## 2018-08-17 MED ORDER — TRAZODONE HCL 100 MG PO TABS
100.0000 mg | ORAL_TABLET | Freq: Every day | ORAL | Status: DC
  Administered 2018-08-17 – 2018-08-19 (×3): 100 mg via ORAL
  Filled 2018-08-17 (×3): qty 1

## 2018-08-17 MED ORDER — MAGNESIUM SULFATE 2 GM/50ML IV SOLN
2.0000 g | Freq: Once | INTRAVENOUS | Status: AC
  Administered 2018-08-17: 2 g via INTRAVENOUS
  Filled 2018-08-17: qty 50

## 2018-08-17 NOTE — ED Provider Notes (Addendum)
Vitals:   08/17/18 1733 08/17/18 1914  BP: (!) 172/117 (!) 163/117  Pulse: 100 (!) 138  Resp: 18 18  Temp: 97.8 F (36.6 C)   SpO2: 96% 98%     Patient resting comfortably in the hallway.  He has been able to ambulate back and forth without distress to the bathroom.  He is fully awake and alert.  Reports has been through alcohol rehab several times without success.  He does not appear acutely intoxicated, he has clear speech, able to ambulate with steady gait without any assistance.  He does report straining feel bit tremulous, unfortunately he is not presently no expressing interest in detox. I will give him a small second dose of oral ativan to assist in his slight tremors at this time. He is alert, fully oriented. No distress.  I will discharge the patient home.  He is requested we help a taxi for him, he lives in [place]  He appears appropriate for discharge.  Return precautions and treatment recommendations and follow-up discussed with the patient who is agreeable with the plan.    Sharyn Creamer, MD 08/17/18 1705   Patient remains awake and alert.  Taxi is here to get him.  He does report some tremulousness ongoing.  Remains hypertensive, reports he is now tachycardic with a heart rate about 140.  He is obviously tremulous, feels that he is injured withdrawals and "DTs".  Will start on IV [place], initiating with 2 mg IV Ativan now.  Unfortunately, the patient appears to be entering significant withdrawal at this time and will require hospitalization due to the severity of his symptoms which have come up over only a couple hours of time.  We will place him on a monitor, initiate IV Ativan and admit due to anticipated severe withdrawals given that he is now into that.  He remains alert and oriented without obvious delirium or confusion or agitation     Sharyn Creamer, MD 08/17/18 1906  ----------------------------------------- 7:47 PM on  08/17/2018 -----------------------------------------  Patient is tremulous to improve.  He is calm, cooperative and compliant.  Reports feeling better after IV Ativan.  Awaiting admission    Sharyn Creamer, MD 08/17/18 9826

## 2018-08-17 NOTE — ED Provider Notes (Addendum)
[facility] Emergency Department Provider Note  ____________________________________________   I have reviewed the triage vital signs and the nursing notes. Where available I have reviewed prior notes and, if possible and indicated, outside hospital notes.    HISTORY  Chief Complaint Alcohol Intoxication    HPI Kurt Horton is a 58 y.o. male  Who is frequently here for EtOH abuse presents today after having had EtOH abuse.  Patient states his been drinking a little bit.  He has been seen here innumerable times for the same.  He is awake and alert he denies trauma.  He has no complaints.  Does have a history of getting sufficiently intoxicated to soil himself.   Past Medical History:  Diagnosis Date  . A-fib (HCC)   . Alcohol abuse   . Alcohol withdrawal (HCC) 11/12/2016  . Diabetes mellitus without complication (HCC)   . DJD (degenerative joint disease)   . Hypertension   . Myasthenia gravis (HCC)   . Renal disorder     Patient Active Problem List   Diagnosis Date Noted  . Tobacco use disorder 05/27/2018  . GI bleed 02/04/2018  . Alcohol intoxication (HCC) 12/22/2017  . Malnutrition of moderate degree 10/12/2017  . Chronic combined systolic and diastolic CHF (congestive heart failure) (HCC) 08/13/2017  . Alcohol withdrawal delirium (HCC) 02/26/2017  . Atrial fibrillation with RVR (HCC) 02/25/2017  . Acute alcoholic intoxication with complication (HCC)   . Alcohol use disorder, severe, dependence (HCC)   . Substance induced mood disorder (HCC) 11/13/2016  . SVT (supraventricular tachycardia) (HCC) 11/12/2016  . HTN (hypertension) 11/12/2016  . EtOH dependence (HCC) 11/12/2016  . Lumbar radiculopathy 08/24/2016  . Major depressive disorder, recurrent severe without psychotic features (HCC) 08/24/2016  . Chronic low back pain 01/04/2016  . Pressure ulcer 12/29/2015  . Osteomyelitis (HCC) 12/26/2015  . PVD (peripheral vascular disease)  (HCC) 12/26/2015  . Type II diabetes mellitus with manifestations (HCC) 12/26/2015  . Myasthenia gravis (HCC) 12/26/2015    Past Surgical History:  Procedure Laterality Date  . AMPUTATION TOE Left 12/29/2015   Procedure: AMPUTATION TOE;  Surgeon: Gwyneth Revels, DPM;  Location: [facility] ORS;  Service: Podiatry;  Laterality: Left;  . ESOPHAGOGASTRODUODENOSCOPY N/A 02/05/2018   Procedure: ESOPHAGOGASTRODUODENOSCOPY (EGD);  Surgeon: Pasty Spillers, MD;  Location: [facility] ENDOSCOPY;  Service: Endoscopy;  Laterality: N/A;  . PERIPHERAL VASCULAR CATHETERIZATION N/A 12/27/2015   Procedure: Lower Extremity Angiography;  Surgeon: Annice Needy, MD;  Location: [facility] INVASIVE CV LAB;  Service: Cardiovascular;  Laterality: N/A;  . right hip surgery    . right shoulder surgery      Prior to Admission medications   Medication Sig Start Date End Date Taking? Authorizing Provider  B Complex-C (B-COMPLEX WITH VITAMIN C) tablet Take 1 tablet by mouth daily. 11/02/17   Alford Highland, MD  cloNIDine (CATAPRES - DOSED IN MG/24 HR) 0.1 mg/24hr patch Place 1 patch (0.1 mg total) onto the skin once a week. Patient not taking: Reported on 06/12/2018 05/30/18   Pucilowska, Ellin Goodie, MD  diltiazem (CARDIZEM CD) 120 MG 24 hr capsule Take 1 capsule (120 mg total) by mouth daily. 06/29/18   Altamese Dilling, MD  folic acid (FOLVITE) 1 MG tablet Take 1 tablet (1 mg total) by mouth daily. 06/29/18   Altamese Dilling, MD  gabapentin (NEURONTIN) 300 MG capsule Take 2 capsules (600 mg total) by mouth 3 (three) times daily. 05/30/18   Pucilowska, Ellin Goodie, MD  LORazepam (ATIVAN) 1 MG tablet Take  1 tablet (1 mg total) by mouth every 8 (eight) hours as needed for anxiety. 06/28/18   Altamese Dilling, MD  metFORMIN (GLUCOPHAGE) 500 MG tablet Take 1 tablet (500 mg total) by mouth daily. 05/30/18   Pucilowska, Braulio Conte B, MD  metoprolol succinate (TOPROL-XL) 50 MG 24 hr tablet Take 1 tablet (50 mg total) by mouth daily. Take  with or immediately following a meal. 05/30/18   Pucilowska, Jolanta B, MD  multivitamin (ONE-A-DAY MEN'S) TABS tablet Take 1 tablet by mouth daily. 04/19/18   Shaune Pollack, MD  mycophenolate (CELLCEPT) 500 MG tablet Take 1 tablet (500 mg total) by mouth 2 (two) times daily. Take 1 tablet ( ) by mouth every morning and 2 tablets ( ) by mouth every evening Patient taking differently: Take 500-1,000 mg by mouth See admin instructions. Take 1 tablet ( ) by mouth every morning and 2 tablets ( ) by mouth every evening  05/30/18   Pucilowska, Jolanta B, MD  senna-docusate (SENOKOT-S) 8.6-50 MG tablet Take 1 tablet by mouth at bedtime as needed for mild constipation. Patient taking differently: Take 1 tablet by mouth daily.  12/25/17   Mayo, Allyn Kenner, MD  testosterone cypionate (DEPOTESTOSTERONE CYPIONATE) 200 MG/ML injection Inject 200 mg into the muscle every 14 (fourteen) days.    [provider]  thiamine 100 MG tablet Take 1 tablet (100 mg total) by mouth daily. 06/29/18   Altamese Dilling, MD  traZODone (DESYREL) 100 MG tablet Take 1 tablet (100 mg total) by mouth at bedtime. 05/30/18   Pucilowska, Ellin Goodie, MD    Allergies Nsaids and Other  Family History  Problem Relation Age of Onset  . Rheum arthritis Father     Social History Social History   Tobacco Use  . Smoking status: Current Every Day Smoker    Packs/day: 0.50    Types: Cigarettes  . Smokeless tobacco: Never Used  Substance Use Topics  . Alcohol use: Yes    Alcohol/week: 20.0 - 30.0 standard drinks    Types: 20 - 30 Shots of liquor per week    Comment: Last drink 3 days ago 06/22/2018- rum  . Drug use: Yes    Types: Oxycodone    Review of Systems Constitutional: No fever/chills Eyes: No visual changes. ENT: No sore throat. No stiff neck no neck pain Cardiovascular: Denies chest pain. Respiratory: Denies shortness of breath. Gastrointestinal:   no vomiting.  No diarrhea.  No  constipation. Genitourinary: Negative for dysuria. Musculoskeletal: Negative lower extremity swelling Skin: Negative for rash. Neurological: Negative for severe headaches, focal weakness or numbness.   ____________________________________________   PHYSICAL EXAM:  VITAL SIGNS: ED Triage Vitals  Enc Vitals Group     BP 08/17/18 1058 (!) 145/92     Pulse Rate 08/17/18 1058 93     Resp 08/17/18 1058 16     Temp 08/17/18 1058 (!) 97.5 F (36.4 C)     Temp Source 08/17/18 1058 Oral     SpO2 08/17/18 1058 95 %     Weight 08/17/18 1059 180 lb 12.4 oz (82 kg)     Height 08/17/18 1059  (1.753 m)     Head Circumference --      Peak Flow --      Pain Score 08/17/18 1058 9     Pain Loc --      Pain Edu? --      Excl. in GC? --     Constitutional: Patient is somnolent smells little bit of alcohol, he is  nonfocal and will wake up and talk to me Eyes: Conjunctivae are normal.  Equally round and reactive to light Head: Atraumatic HEENT: No congestion/rhinnorhea. Mucous membranes are moist.  Oropharynx non-erythematous Neck:   Nontender with no meningismus, no masses, no stridor Cardiovascular: Normal rate, regular rhythm. Grossly normal heart sounds.  Good peripheral circulation. Respiratory: Normal respiratory effort.  No retractions. Lungs CTAB. Abdominal: Soft and nontender. No distention. No guarding no rebound Back:  There is no focal tenderness or step off.  there is no midline tenderness there are no lesions noted. there is no CVA tenderness Musculoskeletal: No lower extremity tenderness, no upper extremity tenderness. No joint effusions, no DVT signs strong distal pulses no edema Neurologic:  Normal speech and language. No gross focal neurologic deficits are appreciated.  Skin:  Skin is warm, dry and intact. No rash noted. Psychiatric: Mood and affect are normal. Speech and behavior are normal.  ____________________________________________   LABS (all labs ordered are  listed, but only abnormal results are displayed)  Labs Reviewed  CBC WITH DIFFERENTIAL/PLATELET - Abnormal; Notable for the following components:      Result Value   RDW 19.4 (*)    All other components within normal limits  ETHANOL  COMPREHENSIVE METABOLIC PANEL  MAGNESIUM    Pertinent labs  results that were available during my care of the patient were reviewed by me and considered in my medical decision making (see chart for details). ____________________________________________  EKG  I personally interpreted any EKGs ordered by me or triage  ____________________________________________  RADIOLOGY  Pertinent labs & imaging results that were available during my care of the patient were reviewed by me and considered in my medical decision making (see chart for details). If possible, patient and/or family made aware of any abnormal findings.  No results found. ____________________________________________    PROCEDURES  Procedure(s) performed: None  Procedures  Critical Care performed: None  ____________________________________________   INITIAL IMPRESSION / ASSESSMENT AND PLAN / ED COURSE  Pertinent labs & imaging results that were available during my care of the patient were reviewed by me and considered in my medical decision making (see chart for details).  Here for presumed EtOH intoxication or abuse, he is following commands and talking to me to some extent.  States he does not want to talk very much.  We will give him a banana bag check basic blood work and watch him while he is here.  No evidence of head trauma or seizure   ----------------------------------------- 3:05 PM on 08/17/2018 -----------------------------------------  Somnolent but arousable alcohol comensurate with exam. Signed out at the end of my shift to dr. Fanny Bienquale     ____________________________________________   FINAL CLINICAL IMPRESSION(S) / ED DIAGNOSES  Final diagnoses:  None       This chart was dictated using voice recognition software.  Despite best efforts to proofread,  errors can occur which can change meaning.      Jeanmarie PlantMcShane,  A, MD 08/17/18 1209    Jeanmarie PlantMcShane,  A, MD 08/17/18 1505    Jeanmarie PlantMcShane,  A, MD 08/17/18 [phone]

## 2018-08-17 NOTE — ED Notes (Signed)
Matt RN, aware of bed assigned

## 2018-08-17 NOTE — Discharge Instructions (Addendum)
You came into the hospital with alcohol withdrawal. Your heart was beating really quickly. This got much better when we started your home medications.  Please make sure you are taking all of your medicines as prescribed.  Take care, Dr. Nancy Marus

## 2018-08-17 NOTE — ED Notes (Signed)
ED TO INPATIENT HANDOFF REPORT  ED Nurse Name and Phone #: [phone]  S Name/Age/Gender Kurt Horton 58 y.o. male Room/Bed: ED19HA/ED19HA  Code Status   Code Status: Full Code  Home/SNF/Other Home Patient oriented to: self, place, time and situation Is this baseline? Yes   Triage Complete: Triage complete  Chief Complaint etoh  Triage Note Pt arrives via ems from home, pt's mom called 911 today due to pt being intoxicated and unable to hold his stool. Pt's mom called to the ER and told secretary that the patient needs a colonoscopy while he is here because he can't hold his stool and she is unable to clean him up. Pt is alert and able to answer questions, ems reports pt has drank a lot of bacardi today   Allergies Allergies  Allergen Reactions  . Nsaids Other (See Comments)    Duodenal ulcers, GI bleeding  . Other Other (See Comments)    Patient states he can't take any "mycin" medications because myasthenia gravis.    Level of Care/Admitting Diagnosis ED Disposition    ED Disposition Condition Comment   Admit  Hospital Area: [facility] [[id number]]  Level of Care: Telemetry [5]  Diagnosis: A-fib [facility]) [id number]  Admitting Physician: Milagros Loll [[id number]]  Attending Physician: Milagros Loll [phone]  Estimated length of stay: past midnight tomorrow  Certification:: I certify this patient will need inpatient services for at least 2 midnights  PT Class (Do Not Modify): Inpatient [101]  PT Acc Code (Do Not Modify): Private [1]       B Medical/Surgery History Past Medical History:  Diagnosis Date  . A-fib (HCC)   . Alcohol abuse   . Alcohol withdrawal (HCC) 11/12/2016  . Diabetes mellitus without complication (HCC)   . DJD (degenerative joint disease)   . Hypertension   . Myasthenia gravis (HCC)   . Renal disorder    Past Surgical History:  Procedure Laterality Date  . AMPUTATION TOE Left 12/29/2015   Procedure: AMPUTATION TOE;   Surgeon: Gwyneth Revels, DPM;  Location: [facility] ORS;  Service: Podiatry;  Laterality: Left;  . ESOPHAGOGASTRODUODENOSCOPY N/A 02/05/2018   Procedure: ESOPHAGOGASTRODUODENOSCOPY (EGD);  Surgeon: Pasty Spillers, MD;  Location: [facility] ENDOSCOPY;  Service: Endoscopy;  Laterality: N/A;  . PERIPHERAL VASCULAR CATHETERIZATION N/A 12/27/2015   Procedure: Lower Extremity Angiography;  Surgeon: Annice Needy, MD;  Location: [facility] INVASIVE CV LAB;  Service: Cardiovascular;  Laterality: N/A;  . right hip surgery    . right shoulder surgery       A IV Location/Drains/Wounds Patient Lines/Drains/Airways Status   Active Line/Drains/Airways    Name:   Placement date:   Placement time:   Site:   Days:   Peripheral IV 08/17/18 Right Antecubital   08/17/18    1157    Antecubital   less than 1          Intake/Output Last 24 hours No intake or output data in the 24 hours ending 08/17/18 1942  Labs/Imaging Results for orders placed or performed during the hospital encounter of 08/17/18 (from the past 48 hour(s))  CBC with Differential     Status: Abnormal   Collection Time: 08/17/18 11:38 AM  Result Value Ref Range   WBC 5.3 4.0 - 10.5 K/uL   RBC 5.19 4.22 - 5.81 MIL/uL   Hemoglobin 15.1 13.0 - 17.0 g/dL   HCT 84.6 96.2 - 95.2 %   MCV 90.0 80.0 - 100.0 fL   MCH 29.1 26.0 -  34.0 pg   MCHC 32.3 30.0 - 36.0 g/dL   RDW 33.2 (H) 95.1 - 88.4 %   Platelets 181 150 - 400 K/uL   nRBC 0.0 0.0 - 0.2 %   Neutrophils Relative % 44 %   Neutro Abs 2.4 1.7 - 7.7 K/uL   Lymphocytes Relative 42 %   Lymphs Abs 2.2 0.7 - 4.0 K/uL   Monocytes Relative 10 %   Monocytes Absolute 0.5 0.1 - 1.0 K/uL   Eosinophils Relative 1 %   Eosinophils Absolute 0.1 0.0 - 0.5 K/uL   Basophils Relative 2 %   Basophils Absolute 0.1 0.0 - 0.1 K/uL   Immature Granulocytes 1 %   Abs Immature Granulocytes 0.03 0.00 - 0.07 K/uL    Comment: Performed at [facility], [address] [place], [place] [address]  Ethanol      Status: Abnormal   Collection Time: 08/17/18 11:38 AM  Result Value Ref Range   Alcohol, Ethyl (B) 331 (HH) <10 mg/dL    Comment: CRITICAL RESULT CALLED TO, READ BACK BY AND VERIFIED WITH RHEA MITCHELL AT 1206 08/17/2018.PMF (NOTE) Lowest detectable limit for serum alcohol is 10 mg/dL. For medical purposes only. Performed at [facility], [address] [place], [place] [address]   Comprehensive metabolic panel     Status: Abnormal   Collection Time: 08/17/18 11:38 AM  Result Value Ref Range   Sodium 142 135 - 145 mmol/L   Potassium 3.9 3.5 - 5.1 mmol/L    Comment: HEMOLYSIS AT THIS LEVEL MAY AFFECT RESULT   Chloride 100 98 - 111 mmol/L   CO2 29 22 - 32 mmol/L   Glucose, Bld 128 (H) 70 - 99 mg/dL   BUN <5 (L) 6 - 20 mg/dL   Creatinine, Ser 1.09 0.61 - 1.24 mg/dL   Calcium 8.0 (L) 8.9 - 10.3 mg/dL   Total Protein 6.6 6.5 - 8.1 g/dL   Albumin 3.2 (L) 3.5 - 5.0 g/dL   AST 43 (H) 15 - 41 U/L    Comment: HEMOLYSIS AT THIS LEVEL MAY AFFECT RESULT   ALT 14 0 - 44 U/L    Comment: HEMOLYSIS AT THIS LEVEL MAY AFFECT RESULT   Alkaline Phosphatase 104 38 - 126 U/L   Total Bilirubin 1.3 (H) 0.3 - 1.2 mg/dL    Comment: HEMOLYSIS AT THIS LEVEL MAY AFFECT RESULT   GFR calc non Af Amer >60 >60 mL/min   GFR calc Af Amer >60 >60 mL/min   Anion gap 13 5 - 15    Comment: Performed at [facility], [address], [place], [place] [address]  Magnesium     Status: None   Collection Time: 08/17/18 11:38 AM  Result Value Ref Range   Magnesium 1.8 1.7 - 2.4 mg/dL    Comment: Performed at [facility], [address] [place], [place] [address]   No results found.  Pending Labs Unresulted Labs (From admission, onward)    Start     Ordered   08/24/18 0500  Creatinine, serum  (enoxaparin (LOVENOX)    CrCl >/= 30 ml/min)  Weekly,   STAT    Comments:  while on enoxaparin therapy    08/17/18 1924   08/18/18 0500  Comprehensive metabolic panel  Tomorrow morning,   STAT      08/17/18 1924   08/18/18 0500  CBC  Tomorrow morning,   STAT     08/17/18 1924  Vitals/Pain Today's Vitals   08/17/18 1058 08/17/18 1059 08/17/18 1733 08/17/18 1914  BP: (!) 145/92  (!) 172/117 (!) 163/117  Pulse: 93  100 (!) 138  Resp: Temp: (!) 97.5 F (36.4 C)  97.8 F (36.6 C)   TempSrc: Oral  Oral   SpO2: 95%  96% 98%  Weight:  180 lb 12.4 oz (82 kg)    Height:   (1.753 m)    PainSc: 9   0-No pain 9     Isolation Precautions No active isolations  Medications Medications  LORazepam (ATIVAN) tablet 0.5 mg (0.5 mg Oral Not Given 08/17/18 1936)  LORazepam (ATIVAN) injection 0-4 mg (0 mg Intravenous Given 08/17/18 1935)    Or  LORazepam (ATIVAN) tablet 0-4 mg ( Oral See Alternative 08/17/18 1935)  LORazepam (ATIVAN) injection 0-4 mg (has no administration in time range)    Or  LORazepam (ATIVAN) tablet 0-4 mg (has no administration in time range)  thiamine (VITAMIN B-1) tablet 100 mg (has no administration in time range)    Or  thiamine (B-1) injection 100 mg (has no administration in time range)  diltiazem (CARDIZEM CD) 24 hr capsule 120 mg (has no administration in time range)  metoprolol succinate (TOPROL-XL) 24 hr tablet 50 mg (has no administration in time range)  enoxaparin (LOVENOX) injection 40 mg (has no administration in time range)  sodium chloride flush (NS) 0.9 % injection 3 mL (has no administration in time range)  acetaminophen (TYLENOL) tablet 650 mg (has no administration in time range)    Or  acetaminophen (TYLENOL) suppository 650 mg (has no administration in time range)  polyethylene glycol (MIRALAX / GLYCOLAX) packet 17 g (has no administration in time range)  oxyCODONE (Oxy IR/ROXICODONE) immediate release tablet 10 mg (has no administration in time range)  ondansetron (ZOFRAN) tablet 4 mg (has no administration in time range)    Or  ondansetron (ZOFRAN) injection 4 mg (has no administration in time range)  albuterol  (PROVENTIL) (2.5 MG/3ML) 0.083% nebulizer solution 2.5 mg (has no administration in time range)  lactated ringers infusion (has no administration in time range)  metoprolol tartrate (LOPRESSOR) injection 10 mg (has no administration in time range)  LORazepam (ATIVAN) tablet 1 mg (has no administration in time range)    Or  LORazepam (ATIVAN) injection 1 mg (has no administration in time range)  thiamine (VITAMIN B-1) tablet 100 mg (has no administration in time range)    Or  thiamine (B-1) injection 100 mg (has no administration in time range)  folic acid (FOLVITE) tablet 1 mg (has no administration in time range)  multivitamin with minerals tablet 1 tablet (has no administration in time range)  chlordiazePOXIDE (LIBRIUM) capsule 25 mg (has no administration in time range)  thiamine (B-1) injection 100 mg (100 mg Intravenous Given 08/17/18 1158)  folic acid (FOLVITE) tablet 1 mg (1 mg Oral Given 08/17/18 1158)  sodium chloride 0.9 % bolus 1,000 mL (1,000 mLs Intravenous Bolus 08/17/18 1157)  magnesium sulfate IVPB 2 g 50 mL (0 g Intravenous Stopped 08/17/18 1354)  LORazepam (ATIVAN) tablet 1 mg (1 mg Oral Given 08/17/18 1604)  LORazepam (ATIVAN) tablet 1 mg (1 mg Oral Given 08/17/18 1708)  loperamide (IMODIUM) capsule 2 mg (2 mg Oral Given 08/17/18 1740)  LORazepam (ATIVAN) injection 2 mg (2 mg Intravenous Given 08/17/18 1930)    Mobility walks High fall risk   Focused Assessments Cardiac Assessment Handoff:    Lab Results  Component  Value Date   CKTOTAL 589 (H) 04/15/2018   TROPONINI <0.03 06/24/2018   No results found for: DDIMER Does the Patient currently have chest pain? No     R Recommendations: See Admitting Provider Note  Report given to:   Additional Notes:

## 2018-08-17 NOTE — ED Notes (Signed)
Pt verbalized understanding of discharge instructions and follow up care.  Pt signed paper copy of e-signature form.  Form placed in medical records file.  Pt discharged to lobby to cab to get a ride home.

## 2018-08-17 NOTE — ED Triage Notes (Signed)
Pt arrives via ems from home, pt's mom called 911 today due to pt being intoxicated and unable to hold his stool. Pt's mom called to the ER and told secretary that the patient needs a colonoscopy while he is here because he can't hold his stool and she is unable to clean him up. Pt is alert and able to answer questions, ems reports pt has drank a lot of bacardi today

## 2018-08-17 NOTE — H&P (Signed)
[employer] [facility] [place] [facility]   PATIENT NAME: Kurt Horton    MR#:  [id number]  DATE OF BIRTH:  1961/01/29  DATE OF ADMISSION:  08/17/2018  PRIMARY CARE PHYSICIAN: [name], MD   REQUESTING/REFERRING PHYSICIAN: Dr. Fanny Bien  CHIEF COMPLAINT:   Chief Complaint  Patient presents with  . Alcohol Intoxication    HISTORY OF PRESENT ILLNESS:  [name]  is a 58 y.o. male with a known history of myasthenia gravis, hypertension, diabetes mellitus, alcohol abuse, paroxysmal atrial fibrillation presents to the emergency room sent in by his mom due to increased alcohol intake.  Here patient was initially drowsy with alcohol levels of 335.  All his lab work seem to be close to normal and patient was waiting for a taxi ride.  Unfortunately during this time patient woke up and is in florid withdrawals.  His blood pressure is 180/116, tachycardia into the 150s with atrial fibrillation and significant tremors.  He has received 3 doses of Ativan with no significant improvement.  Patient is being admitted for alcohol withdrawals.  He tells me he does have diarrhea when he goes through withdrawals which is unchanged.  He is also asking for IV fentanyl for his back pain. I have seen this patient in the past and he always requests IV pain medications.  Even when his withdrawals are resolved he tends to have some tremors and requests IV Ativan prior to discharge.  PAST MEDICAL HISTORY:   Past Medical History:  Diagnosis Date  . A-fib (HCC)   . Alcohol abuse   . Alcohol withdrawal (HCC) 11/12/2016  . Diabetes mellitus without complication (HCC)   . DJD (degenerative joint disease)   . Hypertension   . Myasthenia gravis (HCC)   . Renal disorder     PAST SURGICAL HISTORY:   Past Surgical History:  Procedure Laterality Date  . AMPUTATION TOE Left 12/29/2015   Procedure: AMPUTATION TOE;  Surgeon: Gwyneth Revels, DPM;  Location: [facility] ORS;  Service: Podiatry;  Laterality:  Left;  . ESOPHAGOGASTRODUODENOSCOPY N/A 02/05/2018   Procedure: ESOPHAGOGASTRODUODENOSCOPY (EGD);  Surgeon: Pasty Spillers, MD;  Location: [facility] ENDOSCOPY;  Service: Endoscopy;  Laterality: N/A;  . PERIPHERAL VASCULAR CATHETERIZATION N/A 12/27/2015   Procedure: Lower Extremity Angiography;  Surgeon: Annice Needy, MD;  Location: [facility] INVASIVE CV LAB;  Service: Cardiovascular;  Laterality: N/A;  . right hip surgery    . right shoulder surgery      SOCIAL HISTORY:   Social History   Tobacco Use  . Smoking status: Current Every Day Smoker    Packs/day: 0.50    Types: Cigarettes  . Smokeless tobacco: Never Used  Substance Use Topics  . Alcohol use: Yes    Alcohol/week: 20.0 - 30.0 standard drinks    Types: 20 - 30 Shots of liquor per week    Comment: Last drink 3 days ago 06/22/2018- rum    FAMILY HISTORY:   Family History  Problem Relation Age of Onset  . Rheum arthritis Father     DRUG ALLERGIES:   Allergies  Allergen Reactions  . Nsaids Other (See Comments)    Duodenal ulcers, GI bleeding  . Other Other (See Comments)    Patient states he can't take any "mycin" medications because myasthenia gravis.    REVIEW OF SYSTEMS:   Review of Systems  Constitutional: Positive for malaise/fatigue. Negative for chills, fever and weight loss.  HENT: Negative for hearing loss and nosebleeds.   Eyes: Negative for blurred vision,  double vision and pain.  Respiratory: Negative for cough, hemoptysis, sputum production, shortness of breath and wheezing.   Cardiovascular: Negative for chest pain, palpitations, orthopnea and leg swelling.  Gastrointestinal: Positive for diarrhea and nausea. Negative for abdominal pain, constipation and vomiting.  Genitourinary: Negative for dysuria and hematuria.  Musculoskeletal: Negative for back pain, falls and myalgias.  Skin: Negative for rash.  Neurological: Positive for tremors. Negative for dizziness, sensory change, speech change, focal  weakness, seizures and headaches.  Endo/Heme/Allergies: Does not bruise/bleed easily.  Psychiatric/Behavioral: Negative for depression and memory loss. The patient is not nervous/anxious.     MEDICATIONS AT HOME:   Prior to Admission medications   Medication Sig Start Date End Date Taking? Authorizing Provider  B Complex-C (B-COMPLEX WITH VITAMIN C) tablet Take 1 tablet by mouth daily. 11/02/17   Alford Highland, MD  cloNIDine (CATAPRES - DOSED IN MG/24 HR) 0.1 mg/24hr patch Place 1 patch (0.1 mg total) onto the skin once a week. Patient not taking: Reported on 06/12/2018 05/30/18   Pucilowska, Ellin Goodie, MD  diltiazem (CARDIZEM CD) 120 MG 24 hr capsule Take 1 capsule (120 mg total) by mouth daily. 06/29/18   Altamese Dilling, MD  folic acid (FOLVITE) 1 MG tablet Take 1 tablet (1 mg total) by mouth daily. 06/29/18   Altamese Dilling, MD  gabapentin (NEURONTIN) 300 MG capsule Take 2 capsules (600 mg total) by mouth 3 (three) times daily. 05/30/18   Pucilowska, Jolanta B, MD  LORazepam (ATIVAN) 1 MG tablet Take 1 tablet (1 mg total) by mouth every 8 (eight) hours as needed for anxiety. 06/28/18   Altamese Dilling, MD  metFORMIN (GLUCOPHAGE) 500 MG tablet Take 1 tablet (500 mg total) by mouth daily. 05/30/18   Pucilowska, Braulio Conte B, MD  metoprolol succinate (TOPROL-XL) 50 MG 24 hr tablet Take 1 tablet (50 mg total) by mouth daily. Take with or immediately following a meal. 05/30/18   Pucilowska, Jolanta B, MD  multivitamin (ONE-A-DAY MEN'S) TABS tablet Take 1 tablet by mouth daily. 04/19/18   Shaune Pollack, MD  mycophenolate (CELLCEPT) 500 MG tablet Take 1 tablet (500 mg total) by mouth 2 (two) times daily. Take 1 tablet (500MG ) by mouth every morning and 2 tablets (1000MG ) by mouth every evening Patient taking differently: Take 500-1,000 mg by mouth See admin instructions. Take 1 tablet (500MG ) by mouth every morning and 2 tablets (1000MG ) by mouth every evening  05/30/18   Pucilowska, Jolanta  B, MD  senna-docusate (SENOKOT-S) 8.6-50 MG tablet Take 1 tablet by mouth at bedtime as needed for mild constipation. Patient taking differently: Take 1 tablet by mouth daily.  12/25/17   Mayo, Allyn Kenner, MD  testosterone cypionate (DEPOTESTOSTERONE CYPIONATE) 200 MG/ML injection Inject 200 mg into the muscle every 14 (fourteen) days.    [provider]  thiamine 100 MG tablet Take 1 tablet (100 mg total) by mouth daily. 06/29/18   Altamese Dilling, MD  traZODone (DESYREL) 100 MG tablet Take 1 tablet (100 mg total) by mouth at bedtime. 05/30/18   Pucilowska, Jolanta B, MD     VITAL SIGNS:  Blood pressure (!) 163/117, pulse (!) 138, temperature 97.8 F (36.6 C), temperature source Oral, resp. rate 18, height 5\' 9"  (1.753 m), weight 82 kg, SpO2 98 %.  PHYSICAL EXAMINATION:  Physical Exam  GENERAL:  58 y.o.-year-old patient lying in the bed . EYES: Pupils equal, round, reactive to light and accommodation. No scleral icterus. Extraocular muscles intact.  HEENT: Head atraumatic, normocephalic. Oropharynx and nasopharynx  clear. No oropharyngeal erythema, moist oral mucosa  NECK:  Supple, no jugular venous distention. No thyroid enlargement, no tenderness.  LUNGS: Normal breath sounds bilaterally, no wheezing, rales, rhonchi. No use of accessory muscles of respiration.  CARDIOVASCULAR: Tachycardia irregular ABDOMEN: Soft, nontender, nondistended. Bowel sounds present. No organomegaly or mass.  EXTREMITIES: No pedal edema, cyanosis, or clubbing. + 2 pedal & radial pulses b/l.   NEUROLOGIC: Cranial nerves II through XII are intact. No focal Motor or sensory deficits appreciated b/l PSYCHIATRIC: The patient is alert and oriented x 3.  Extremely anxious SKIN: No obvious rash, lesion, or ulcer.   LABORATORY PANEL:   CBC Recent Labs  Lab 08/17/18 1138  WBC 5.3  HGB 15.1  HCT 46.7  PLT 181    ------------------------------------------------------------------------------------------------------------------  Chemistries  Recent Labs  Lab 08/17/18 1138  NA 142  K 3.9  CL 100  CO2 29  GLUCOSE 128*  BUN <5*  CREATININE 0.84  CALCIUM 8.0*  MG 1.8  AST 43*  ALT 14  ALKPHOS 104  BILITOT 1.3*   ------------------------------------------------------------------------------------------------------------------  Cardiac Enzymes No results for input(s): TROPONINI in the last 168 hours. ------------------------------------------------------------------------------------------------------------------  RADIOLOGY:  No results found.   IMPRESSION AND PLAN:   *Atrial fibrillation with rapid ventricular rate.  EKG pending at this time.  Will give his home dose of Cardizem and metoprolol stat.  I have also ordered IV metoprolol. Not on anticoagulation at home due to noncompliance. Admit to telemetry floor.  *Alcohol withdrawal Patient was given 3 dose of Ativan with no improvement.  At this time will start CIWA protocol.  Start thiamine and folic acid.  IV fluids.  Will add scheduled Librium.  Patient has had prolonged stays with this problem in the past.  *Hypertension, uncontrolled.  Due to not taking medications at home and being in alcohol withdrawals.  Will restart his home medications.  IV PRN medications added.  *Chronic intermittent diarrhea is unchanged.  No abdominal pain or vomiting.  Afebrile.  Normal WBC.  *Myasthenia gravis.  Continue his CellCept from home.  *Diabetes mellitus.  Sliding scale insulin.  DVT prophylaxis with Lovenox   All the records are reviewed and case discussed with ED provider. Management plans discussed with the patient, family and they are in agreement.  CODE STATUS: Full code   TOTAL CRITICAL CARE  TIME TAKING CARE OF THIS PATIENT: 80 minutes.   Orie FishermanSrikar R Yulian Gosney M.D on 08/17/2018 at 7:33 PM  Between 7am to 6pm - Pager -  [phone]  After 6pm go to [email or web address] - password [email or web address] [facility]  [facility] [place] [facility]  Office  [phone]  CC: Primary care physician; Barbette ReichmannHande, Vishwanath, MD  Note: This dictation was prepared with Dragon dictation along with smaller phrase technology. Any transcriptional errors that result from this process are unintentional.

## 2018-08-17 NOTE — BH Assessment (Signed)
    Patient has been seen by Psych MD Elpidio Anis) and will be admitted to the medical floor.  TTS isn't needed at this time as pt has not yet been medically cleared.

## 2018-08-17 NOTE — ED Notes (Signed)
Awaiting [facility] to pick up patient at this time.  [facility] stated they would be here in 30 mins.

## 2018-08-18 ENCOUNTER — Other Ambulatory Visit: Payer: Self-pay

## 2018-08-18 LAB — CBC
HCT: 36 % — ABNORMAL LOW (ref 39.0–52.0)
Hemoglobin: 12.2 g/dL — ABNORMAL LOW (ref 13.0–17.0)
MCH: 29.5 pg (ref 26.0–34.0)
MCHC: 33.9 g/dL (ref 30.0–36.0)
MCV: 87.2 fL (ref 80.0–100.0)
Platelets: 151 10*3/uL (ref 150–400)
RBC: 4.13 MIL/uL — ABNORMAL LOW (ref 4.22–5.81)
RDW: 18.7 % — ABNORMAL HIGH (ref 11.5–15.5)
WBC: 7.3 10*3/uL (ref 4.0–10.5)
nRBC: 0 % (ref 0.0–0.2)

## 2018-08-18 LAB — COMPREHENSIVE METABOLIC PANEL
ALT: 12 U/L (ref 0–44)
AST: 32 U/L (ref 15–41)
Albumin: 3 g/dL — ABNORMAL LOW (ref 3.5–5.0)
Alkaline Phosphatase: 93 U/L (ref 38–126)
Anion gap: 12 (ref 5–15)
BUN: 6 mg/dL (ref 6–20)
CO2: 29 mmol/L (ref 22–32)
Calcium: 7.9 mg/dL — ABNORMAL LOW (ref 8.9–10.3)
Chloride: 95 mmol/L — ABNORMAL LOW (ref 98–111)
Creatinine, Ser: 0.71 mg/dL (ref 0.61–1.24)
GFR calc Af Amer: >60 mL/min (ref >60)
GFR calc non Af Amer: >60 mL/min (ref >60)
Glucose, Bld: 116 mg/dL — ABNORMAL HIGH (ref 70–99)
Potassium: 3.2 mmol/L — ABNORMAL LOW (ref 3.5–5.1)
Sodium: 136 mmol/L (ref 135–145)
Total Bilirubin: 1.1 mg/dL (ref 0.3–1.2)
Total Protein: 5.3 g/dL — ABNORMAL LOW (ref 6.5–8.1)

## 2018-08-18 LAB — GLUCOSE, CAPILLARY
Glucose-Capillary: 111 mg/dL — ABNORMAL HIGH (ref 70–99)
Glucose-Capillary: 124 mg/dL — ABNORMAL HIGH (ref 70–99)
Glucose-Capillary: 124 mg/dL — ABNORMAL HIGH (ref 70–99)
Glucose-Capillary: 145 mg/dL — ABNORMAL HIGH (ref 70–99)

## 2018-08-18 MED ORDER — NICOTINE 21 MG/24HR TD PT24
21.0000 mg | MEDICATED_PATCH | Freq: Every day | TRANSDERMAL | Status: DC
  Administered 2018-08-18 – 2018-08-20 (×3): 21 mg via TRANSDERMAL
  Filled 2018-08-18 (×3): qty 1

## 2018-08-18 MED ORDER — POTASSIUM CHLORIDE CRYS ER 20 MEQ PO TBCR
40.0000 meq | EXTENDED_RELEASE_TABLET | Freq: Once | ORAL | Status: AC
  Administered 2018-08-18: 09:00:00 40 meq via ORAL
  Filled 2018-08-18: qty 2

## 2018-08-18 MED ORDER — LOPERAMIDE HCL 2 MG PO CAPS: 2.0000 mg | ORAL_CAPSULE | ORAL | Status: DC | PRN

## 2018-08-18 NOTE — Progress Notes (Addendum)
Pt is requesting for a nicotine patch and a imodium. As per pt he usually smoke 10 to 20 stick a day. Pt also states that he always have diarrhea every time he have alcohol withdrawal. Notified Dr. Arville Care. Will continue to monitor.  Update 0440: Dr. Arville Care ordered nicotine patch 21 mg daily, Imodium 2 mg for every unformed bowel movement maximum of 16 mg/ daily, and check culture and wbcz for stool.

## 2018-08-18 NOTE — Plan of Care (Signed)
  Problem: Education: Goal: Knowledge of General Education information will improve Description: Including pain rating scale, medication(s)/side effects and non-pharmacologic comfort measures Outcome: Progressing   Problem: Safety: Goal: Ability to remain free from injury will improve Outcome: Progressing   

## 2018-08-18 NOTE — Progress Notes (Signed)
[employer] -  at [facility]   PATIENT NAME: Kyndell Heward    MR#:  [id number]  DATE OF BIRTH:  1960-05-09  SUBJECTIVE:   Patient states that he is feeling very "shaky" today.  He denies any chest pain, shortness of breath, palpitations.  REVIEW OF SYSTEMS:  Review of Systems  Constitutional: Negative for chills and fever.  HENT: Negative for congestion and sore throat.   Eyes: Negative for blurred vision and double vision.  Respiratory: Negative for cough and shortness of breath.   Cardiovascular: Negative for chest pain and palpitations.  Gastrointestinal: Negative for nausea and vomiting.  Genitourinary: Negative for dysuria and urgency.  Musculoskeletal: Negative for back pain and neck pain.  Neurological: Positive for tremors. Negative for dizziness and headaches.  Psychiatric/Behavioral: Negative for depression. The patient is not nervous/anxious.     DRUG ALLERGIES:   Allergies  Allergen Reactions  . Nsaids Other (See Comments)    Duodenal ulcers, GI bleeding  . Other Other (See Comments)    Patient states he can't take any "mycin" medications because myasthenia gravis.   VITALS:  Blood pressure (!) 142/79, pulse 91, temperature 98.4 F (36.9 C), temperature source Oral, resp. rate 20, height 5\' 11"  (1.803 m), weight 82.5 kg, SpO2 98 %. PHYSICAL EXAMINATION:  Physical Exam  GENERAL:  Laying in the bed with no acute distress.  HEENT: Head atraumatic, normocephalic. Pupils equal, round, reactive to light and accommodation. No scleral icterus. Extraocular muscles intact. Oropharynx and nasopharynx clear.  NECK:  Supple, no jugular venous distention. No thyroid enlargement. LUNGS: Lungs are clear to auscultation bilaterally. No wheezes, crackles, rhonchi. No use of accessory muscles of respiration.  CARDIOVASCULAR: Tachycardic, regular rhythm, S1, S2 normal. No murmurs, rubs, or gallops.  ABDOMEN: Soft, nontender, nondistended. Bowel sounds  present.  EXTREMITIES: No pedal edema, cyanosis, or clubbing.  NEUROLOGIC: CN 2-12 intact, no focal deficits. 5/5 muscle strength throughout all extremities. Sensation intact throughout. Gait not checked. + tremor PSYCHIATRIC: The patient is alert and oriented x 3.  SKIN: No obvious rash, lesion, or ulcer.  LABORATORY PANEL:  Male CBC Recent Labs  Lab 08/18/18 0509  WBC 7.3  HGB 12.2*  HCT 36.0*  PLT 151   ------------------------------------------------------------------------------------------------------------------ Chemistries  Recent Labs  Lab 08/17/18 1138 08/18/18 0509  NA 142 136  K 3.9 3.2*  CL 100 95*  CO2 29 29  GLUCOSE 128* 116*  BUN <5* 6  CREATININE 0.84 0.71  CALCIUM 8.0* 7.9*  MG 1.8  --   AST 43* 32  ALT 14 12  ALKPHOS 104 93  BILITOT 1.3* 1.1   RADIOLOGY:  No results found. ASSESSMENT AND PLAN:   Atrial fibrillation with rapid ventricular rate- resolved with diltiazem and metoprolol. -Continue home diltiazem and metoprolol -Metoprolol IV as needed for heart rate > 100 -Cardiac monitoring  Alcohol withdrawal -Continue scheduled Librium and Ativan IV as needed per CIWA protocol  Hypokalemia- K 3.2 -Replete and recheck  Hypertension- improved -Continue home meds -IV metoprolol as needed for elevated blood pressures  Chronic intermittent diarrhea -Stool studies ordered by overnight physician  Myasthenia gravis-no signs of myasthenia crisis -Continue home CellCept  Type 2 diabetes- blood sugars well-controlled  -Sliding scale insulin  DVT prophylaxis with Lovenox  All the records are reviewed and case discussed with Care Management/Social Worker. Management plans discussed with the patient, family and they are in agreement.  CODE STATUS: Full Code  TOTAL TIME TAKING CARE OF THIS PATIENT: 45 minutes.  More than 50% of the time was spent in counseling/coordination of care: YES  POSSIBLE D/C IN 1-2 DAYS, DEPENDING ON CLINICAL  CONDITION.   Jinny BlossomKaty D Haja Crego M.D on 08/18/2018 at 3:10 PM  Between 7am to 6pm - Pager - [phone]  After 6pm go to [email or web address] - Social research officer, governmentpassword EPAS [facility]  [facility] [place] [facility]  Office  [phone]  CC: Primary care physician; Barbette ReichmannHande, Vishwanath, MD  Note: This dictation was prepared with Dragon dictation along with smaller phrase technology. Any transcriptional errors that result from this process are unintentional.

## 2018-08-18 NOTE — Evaluation (Signed)
Physical Therapy Evaluation Patient Details Name: Kurt Horton MRN: [id number] DOB: Dec 29, 1960 Today's Date: 08/18/2018   History of Present Illness  58 y/o M admitted with Afib, ETOH withdrawl.  Pt has had frequent admissions at this facility for alcohol detox in the past year (>10). PMH includes chronic pain, myasthenia gravis, a-fib.   Clinical Impression  It appears pt's mother regularly calls EMS to bring him to ED when he drinks too much, admitted with A-fib.  Pt with poor balance (very reliant on AD, frequent falls, b/l neuropathy), poor activity tolerance, decreased safety with ambulation and weak for his age (though testing is [facility] most cases).  Pt very eager to work with PT and indicated that he was expecting that if he worked really hard with PT in the hospital (likely to be <72 hours) that he could be ready to go home w/o AD and start going to the gym soon.  PT tried to be realistic and pragmatic with him on this front and educated that he did not get like this overnight and that he will need consistent work and be diligent about not doing activities that would hinder any progress he makes over the next weeks/months with HHPT and self work.  Pt showed good effort with ambulation (though unsteady, running into obstacles and inconsistent cadence) with reliance on the walker as well as strength and balance activities.      Follow Up Recommendations Home health PT    Equipment Recommendations  None recommended by PT    Recommendations for Other Services       Precautions / Restrictions Precautions Precautions: Fall Restrictions Weight Bearing Restrictions: No      Mobility  Bed Mobility Overal bed mobility: Independent             General bed mobility comments: Pt able to get to sitting EOB w/o assist  Transfers Overall transfer level: Needs assistance Equipment used: Rolling walker (2 wheeled) Transfers: Sit to/from Stand Sit to Stand: Min assist          General transfer comment: Pt unable to rise to standing w/o cues for UE placement and weight shift forward.  Most efforts from standard height did require light assist from PT to attain standing  Ambulation/Gait Ambulation/Gait assistance: Min guard Gait Distance (Feet): 85 Feet Assistive device: Rolling walker (2 wheeled)       General Gait Details: Pt with shuffling, slow gait and consistent veering to the L (ran into obstacles multiple times).  He was able to maintain balance but clearly had unsteadiness issues.  Pt generally trailing L LE behind with brief bouts of being able to improve this with cuing.  Pt reliant on the walker and completely unsafe w/o it.  Stairs            Wheelchair Mobility    Modified Rankin (Stroke Patients Only)       Balance Overall balance assessment: Needs assistance Sitting-balance support: Bilateral upper extremity supported Sitting balance-Leahy Scale: Good     Standing balance support: Bilateral upper extremity supported Standing balance-Leahy Scale: Poor Standing balance comment: w/o AD or HHA pt with very poor balance, neuropathy in b/l LEs very limiting.                             Pertinent Vitals/Pain Pain Assessment: 0-10 Pain Score: 4  Pain Location: head from where he fell and hit it    Home Living Family/patient  expects to be discharged to:: Private residence Living Arrangements: Parent Available Help at Discharge: Family;Available 24 hours/day Type of Home: House Home Access: Level entry     Home Layout: One level Home Equipment: Walker - 2 wheels;Cane - single point      Prior Function Level of Independence: Independent with assistive device(s)         Comments: Uses AD most of the time.  Frequent falls when intoxicated, does report some falling when not intoxicated.  Pt ind with bathing, dressing.  Does not drive, says he uses [employer] when he leaves the house.  Pt and family order in and do not do  much cooking.      Hand Dominance        Extremity/Trunk Assessment   Upper Extremity Assessment Upper Extremity Assessment: Generalized weakness(intention tremor, grossly 4/5)    Lower Extremity Assessment Lower Extremity Assessment: Generalized weakness(grossly 4/5)       Communication   Communication: No difficulties  Cognition Arousal/Alertness: Awake/alert Behavior During Therapy: Anxious;Restless Overall Cognitive Status: Within Functional Limits for tasks assessed                                        General Comments      Exercises General Exercises - Lower Extremity Ankle Circles/Pumps: AROM;10 reps Long Arc Quad: Strengthening;10 reps Heel Slides: Strengthening;10 reps Hip ABduction/ADduction: Strengthening;10 reps Hip Flexion/Marching: Strengthening;10 reps Heel Raises: Strengthening;10 reps;Standing(HHA, partial ROM) Other Exercises Other Exercises: standing balance exercises all with HHA (faded double to single as tolerated).  Static standing with wide to shld width BOS, EO/EC, heel raises and small marching in place tasks.  Pt with no ankle control and needing considerable HHA at times to keep from falling back.  Multiple reps of each per tolerance, seated rest breaks between most activities.   Assessment/Plan    PT Assessment Patient needs continued PT services  PT Problem List Decreased strength;Decreased range of motion;Decreased activity tolerance;Decreased balance;Decreased mobility;Decreased coordination;Decreased knowledge of use of DME;Decreased safety awareness;Pain       PT Treatment Interventions DME instruction;Gait training;Stair training;Therapeutic activities;Functional mobility training;Therapeutic exercise;Balance training;Neuromuscular re-education;Patient/family education    PT Goals (Current goals can be found in the Care Plan section)  Acute Rehab PT Goals Patient Stated Goal: Pt reports he really wants to work  hard to get into better shape PT Goal Formulation: With patient Time For Goal Achievement: 09/01/18 Potential to Achieve Goals: Fair    Frequency Min 2X/week   Barriers to discharge        Co-evaluation               AM-PAC PT "6 Clicks" Mobility  Outcome Measure Help needed turning from your back to your side while in a flat bed without using bedrails?: None Help needed moving from lying on your back to sitting on the side of a flat bed without using bedrails?: None Help needed moving to and from a bed to a chair (including a wheelchair)?: A Little Help needed standing up from a chair using your arms (e.g., wheelchair or bedside chair)?: A Little Help needed to walk in hospital room?: A Little Help needed climbing 3-5 steps with a railing? : A Lot 6 Click Score: 19    End of Session Equipment Utilized During Treatment: Gait belt Activity Tolerance: Patient limited by fatigue Patient left: with chair alarm set;with call bell/phone within reach Nurse  Communication: Mobility status PT Visit Diagnosis: Muscle weakness (generalized) (M62.81);Repeated falls (R29.6);Unsteadiness on feet (R26.81);Other abnormalities of gait and mobility (R26.89);History of falling (Z91.81)    Time: 9702-6378 PT Time Calculation (min) (ACUTE ONLY): 48 min   Charges:   PT Evaluation $PT Eval Low Complexity: 1 Low PT Treatments $Gait Training: 8-22 mins $Therapeutic Exercise: 8-22 mins        Malachi Pro, DPT 08/18/2018, 4:25 PM

## 2018-08-19 LAB — BASIC METABOLIC PANEL
Anion gap: 7 (ref 5–15)
BUN: 8 mg/dL (ref 6–20)
CO2: 28 mmol/L (ref 22–32)
Calcium: 8 mg/dL — ABNORMAL LOW (ref 8.9–10.3)
Chloride: 103 mmol/L (ref 98–111)
Creatinine, Ser: 0.81 mg/dL (ref 0.61–1.24)
GFR calc Af Amer: >60 mL/min (ref >60)
GFR calc non Af Amer: >60 mL/min (ref >60)
Glucose, Bld: 134 mg/dL — ABNORMAL HIGH (ref 70–99)
Potassium: 3.2 mmol/L — ABNORMAL LOW (ref 3.5–5.1)
Sodium: 138 mmol/L (ref 135–145)

## 2018-08-19 LAB — HEMOGLOBIN A1C
Hgb A1c MFr Bld: 5.9 % — ABNORMAL HIGH (ref 4.8–5.6)
Mean Plasma Glucose: 123 mg/dL

## 2018-08-19 LAB — GLUCOSE, CAPILLARY
Glucose-Capillary: 134 mg/dL — ABNORMAL HIGH (ref 70–99)
Glucose-Capillary: 162 mg/dL — ABNORMAL HIGH (ref 70–99)
Glucose-Capillary: 114 mg/dL — ABNORMAL HIGH (ref 70–99)

## 2018-08-19 MED ORDER — ENSURE MAX PROTEIN PO LIQD
11.0000 [oz_av] | Freq: Every day | ORAL | Status: DC
  Administered 2018-08-19 – 2018-08-20 (×2): 11 [oz_av] via ORAL
  Filled 2018-08-19: qty 330

## 2018-08-19 MED ORDER — POTASSIUM CHLORIDE CRYS ER 20 MEQ PO TBCR
40.0000 meq | EXTENDED_RELEASE_TABLET | Freq: Once | ORAL | Status: AC
  Administered 2018-08-19: 40 meq via ORAL
  Filled 2018-08-19: qty 2

## 2018-08-19 NOTE — TOC Initial Note (Addendum)
Transition of Care [facility]) - Initial/Assessment Note    Patient Details  Name: Kurt Horton MRN: [id number] Date of Birth: 1961-04-06  Transition of Care [facility]) CM/SW Contact:    Sherren Kerns, RN Phone Number: 08/19/2018, 4:02 PM  Clinical Narrative:   Patient is well know to [facility].  Hx of Afib and alcohol abuse.  Admitted for detox.  Patient states he has applied for Medicaid and it is pending.  Current with Dr. Lajuana Carry PCP.  Obtains medications at [facility] in [place].  Asked patient if he would like a list of self help/rehab centers.  He said he would like that;  Provided list and number to Medicaid office to check on status.   Patient lives with his parents.  He states they will transport him home at [place].  PT is currently working with patient.  He has declined home health in the past and has also been denied by several agencies as patient has a history of violent behavior with police involvement.        Patient has a walker.           Expected Discharge Plan: Home/Self Care Barriers to Discharge: Continued Medical Work up   Patient Goals and CMS Choice        Expected Discharge Plan and Services Expected Discharge Plan: Home/Self Care   Discharge Planning Services: CM Consult   Living arrangements for the past 2 months: Single Family Home Expected Discharge Date: 08/19/18                        Prior Living Arrangements/Services Living arrangements for the past 2 months: Single Family Home Lives with:: Parents Patient language and need for interpreter reviewed:: Yes Do you feel safe going back to the place where you live?: Yes            Criminal Activity/Legal Involvement Pertinent to Current Situation/Hospitalization: No - Comment as needed  Activities of Daily Living Home Assistive Devices/Equipment: Cane (specify quad or straight), Walker (specify type)(1 prong cane ; walker with two wheels) ADL Screening (condition at time of admission) Patient's  cognitive ability adequate to safely complete daily activities?: Yes Is the patient deaf or have difficulty hearing?: No Does the patient have difficulty seeing, even when wearing glasses/contacts?: Yes(yes but states left at home) Does the patient have difficulty concentrating, remembering, or making decisions?: No Patient able to express need for assistance with ADLs?: Yes Does the patient have difficulty dressing or bathing?: No Independently performs ADLs?: Yes (appropriate for developmental age) Does the patient have difficulty walking or climbing stairs?: No Weakness of Legs: Both Weakness of Arms/Hands: None  Permission Sought/Granted                  Emotional Assessment Appearance:: Appears older than stated age   Affect (typically observed): Accepting Orientation: : Oriented to Self, Oriented to Place, Oriented to  Time, Oriented to Situation Alcohol / Substance Use: Not Applicable    Admission diagnosis:  Alcohol abuse [F10.10] Alcohol withdrawal syndrome with complication (HCC) [F10.239] Alcoholic intoxication without complication (HCC) [F10.920] Patient Active Problem List   Diagnosis Date Noted  . A-fib (HCC) 08/17/2018  . Tobacco use disorder 05/27/2018  . GI bleed 02/04/2018  . Alcohol intoxication (HCC) 12/22/2017  . Malnutrition of moderate degree 2020-04-2618  . Chronic combined systolic and diastolic CHF (congestive heart failure) (HCC) 08/13/2017  . Alcohol withdrawal delirium (HCC) 02/26/2017  . Atrial fibrillation with RVR (  HCC) 02/25/2017  . Acute alcoholic intoxication with complication (HCC)   . Alcohol use disorder, severe, dependence (HCC)   . Substance induced mood disorder (HCC) 11/13/2016  . SVT (supraventricular tachycardia) (HCC) 11/12/2016  . HTN (hypertension) 11/12/2016  . EtOH dependence (HCC) 11/12/2016  . Lumbar radiculopathy 08/24/2016  . Major depressive disorder, recurrent severe without psychotic features (HCC) 08/24/2016  .  Chronic low back pain 01/04/2016  . Pressure ulcer 12/29/2015  . Osteomyelitis (HCC) 12/26/2015  . PVD (peripheral vascular disease) (HCC) 12/26/2015  . Type II diabetes mellitus with manifestations (HCC) 12/26/2015  . Myasthenia gravis (HCC) 12/26/2015   PCP:  Barbette ReichmannHande, Vishwanath, MD Pharmacy:   [facility] [place] - [address] [place] [address] Phone: [phone] Fax: [phone]  [facility] [place], [place] - [address] [place] [address] Phone: [phone] Fax: [phone]  [id number] [place] - [place] AT [facility] [address] [place] [address] Phone: [phone] Fax: [phone]     Social Determinants of Health (SDOH) Interventions    Readmission Risk Interventions Readmission Risk Prevention Plan 08/19/2018  Transportation Screening Complete  Medication Review (RN Care Manager) Complete  PCP or Specialist appointment within 3-5 days of discharge Complete  HRI or Home Care Consult Complete  SW Recovery Care/Counseling Consult Complete  Palliative Care Screening Not Applicable  Skilled Nursing Facility Not Applicable  Some recent data might be hidden

## 2018-08-19 NOTE — Progress Notes (Signed)
Physical Therapy Treatment Patient Details Name: Kurt Horton MRN: [id number] DOB: [birth year] Today's Date: 08/19/2018    History of Present Illness 58 y/o M admitted with Afib, ETOH withdrawl. Pt has had frequent admissions at this facility for alcohol detox in the past year (>10). PMH includes chronic pain, myasthenia gravis, a-fib.     PT Comments    Pt received in bed, finishing breakfast, reports more weak this date. Mobility appears much more weak, and patient is unable to AMB 50% of distance AMB yesterday. Pt moving slowly, wide based gait, reports some SOB after near fall in room, questionable for adrenaline rush associated with near fall, MinA from PT to stabilize in standing. Pt is unsafe with RW alone, still requires close minGuard assistance intermittent minA. Lethargy precludes quality finger to nose assessment when attempted.    Follow Up Recommendations  Home health PT;Supervision/Assistance - 24 hour;Supervision for mobility/OOB     Equipment Recommendations  Rolling walker with 5" wheels    Recommendations for Other Services       Precautions / Restrictions Precautions Precautions: Fall Precaution Comments: myasthenia gravis, ETOH withdrawl Restrictions Weight Bearing Restrictions: No    Mobility  Bed Mobility Overal bed mobility: Modified Independent             General bed mobility comments: lethargic, requires 3x amount of time to perform, appears weak.   Transfers Overall transfer level: Needs assistance Equipment used: Rolling walker (2 wheeled) Transfers: Sit to/from Stand Sit to Stand: Supervision         General transfer comment: Performed 6x from EOB c RW; pt moving slow, appearing weak, maximal assistance to rise, intermittent use of RW  Ambulation/Gait Ambulation/Gait assistance: Min assist;Min guard Gait Distance (Feet): 35 Feet Assistive device: Rolling walker (2 wheeled) Gait Pattern/deviations: Ataxic;Wide base of support      General Gait Details: slow, unsteady, establishing baalnce with RW appears labored, intermittently unsuccessful, 1 slow retropulsive event requires minA for correction. Thereafter pt c SOB, dizziness throughout once to EOB.    Stairs             Wheelchair Mobility    Modified Rankin (Stroke Patients Only)       Balance Overall balance assessment: Needs assistance Sitting-balance support: Bilateral upper extremity supported Sitting balance-Leahy Scale: Good     Standing balance support: Bilateral upper extremity supported Standing balance-Leahy Scale: Poor Standing balance comment: w/o AD or HHA pt with very poor balance, neuropathy in b/l LEs very limiting.                            Cognition Arousal/Alertness: Awake/alert Behavior During Therapy: Anxious;Restless Overall Cognitive Status: Within Functional Limits for tasks assessed                                        Exercises      General Comments        Pertinent Vitals/Pain Pain Assessment: No/denies pain    Home Living                      Prior Function            PT Goals (current goals can now be found in the care plan section) Acute Rehab PT Goals Patient Stated Goal: Pt reports he really wants to work hard to get  into better shape PT Goal Formulation: With patient Time For Goal Achievement: 09/01/18 Potential to Achieve Goals: Fair Progress towards PT goals: Progressing toward goals    Frequency    Min 2X/week      PT Plan Current plan remains appropriate    Co-evaluation              AM-PAC PT "6 Clicks" Mobility   Outcome Measure  Help needed turning from your back to your side while in a flat bed without using bedrails?: None Help needed moving from lying on your back to sitting on the side of a flat bed without using bedrails?: None Help needed moving to and from a bed to a chair (including a wheelchair)?: A Little Help  needed standing up from a chair using your arms (e.g., wheelchair or bedside chair)?: A Little Help needed to walk in hospital room?: A Lot Help needed climbing 3-5 steps with a railing? : A Lot 6 Click Score: 18    End of Session Equipment Utilized During Treatment: Gait belt Activity Tolerance: Patient limited by lethargy;Patient limited by fatigue Patient left: with chair alarm set;with call bell/phone within reach Nurse Communication: Mobility status PT Visit Diagnosis: Muscle weakness (generalized) (M62.81);Repeated falls (R29.6);Unsteadiness on feet (R26.81);Other abnormalities of gait and mobility (R26.89);History of falling (Z91.81)     Time: 0413-6438 PT Time Calculation (min) (ACUTE ONLY): 30 min  Charges:  $Gait Training: 8-22 mins $Therapeutic Exercise: 8-22 mins                     11:02 AM, 08/19/18 Rosamaria Lints, PT, DPT Physical Therapist - [facility]  [phone] (ASCOM)     Buccola,[name] 08/19/2018, 11:00 AM

## 2018-08-19 NOTE — Progress Notes (Signed)
Initial Nutrition Assessment  DOCUMENTATION CODES:   Not applicable  INTERVENTION:   Ensure Max protein supplement daily, each supplement provides 150kcal and 30g of protein.  MVI, thiamine and folic acid in setting of etoh abuse  Pt at moderate refeed risk; recommend monitor K, Mg and P labs daily until stable.   NUTRITION DIAGNOSIS:   Increased nutrient needs related to social / environmental circumstances(etoh abuse ) as evidenced by increased estimated needs.  GOAL:   Patient will meet greater than or equal to 90% of their needs  MONITOR:   PO intake, Supplement acceptance, Labs, Weight trends, I & O's, Skin  REASON FOR ASSESSMENT:   Malnutrition Screening Tool    ASSESSMENT:   58 y.o. male with a known history of myasthenia gravis, hypertension, diabetes mellitus, alcohol abuse, paroxysmal atrial fibrillation presents to the emergency room due to increased alcohol intake.  RD working remotely.  Suspect pt with poor appetite and oral intake at baseline r/t etoh abuse. There are no documented intakes from this admit. Per chart, pt appears fairly weight stable pta. Pt on CIWA protocol. RD will add supplements to help pt meet his estimated needs. Suspect pt at moderate refeed risk; recommend monitor K, Mg and P labs daily until stable. Per chart, pt with chronic intermittent diarrhea.   Medications reviewed and include: lovenox, folic acid, insulin, MVI, nicotine, thiamine, LRS @125ml /hr  Labs reviewed: K 3.2(L)  Unable to complete Nutrition-Focused physical exam at this time.   Diet Order:   Diet Order            Diet - low sodium heart healthy        Diet Carb Modified Fluid consistency: Thin; Room service appropriate? Yes  Diet effective now             EDUCATION NEEDS:   Not appropriate for education at this time  Skin:  Skin Assessment: Reviewed RN Assessment  Last BM:  4/11  Height:   Ht Readings from Last 1 Encounters:  08/17/18 5\' 11"  (1.803  m)    Weight:   Wt Readings from Last 1 Encounters:  08/19/18 86.2 kg    Ideal Body Weight:  78.2 kg  BMI:  Body mass index is 26.5 kg/m.  Estimated Nutritional Needs:   Kcal:  1900-2200kcal/day   Protein:  95-105g/day   Fluid:  2.2L/day   Betsey Holiday MS, RD, LDN Pager #- [phone] Office#- [phone] After Hours Pager: [phone]

## 2018-08-19 NOTE — Progress Notes (Signed)
[employer] [facility] [place] at [facility]   PATIENT NAME: Kurt Horton    MR#:  [id number]  DATE OF BIRTH:  1961-02-10  SUBJECTIVE:   Patient states he continues to feel very weak today.  He was only able to walk with physical therapy a little bit yesterday.  He also endorses continued "shakiness".  He states that his back and neck hurt really badly.  REVIEW OF SYSTEMS:  Review of Systems  Constitutional: Negative for chills and fever.  HENT: Negative for congestion and sore throat.   Eyes: Negative for blurred vision and double vision.  Respiratory: Negative for cough and shortness of breath.   Cardiovascular: Negative for chest pain and palpitations.  Gastrointestinal: Negative for nausea and vomiting.  Genitourinary: Negative for dysuria and urgency.  Musculoskeletal: Positive for back pain and neck pain.  Neurological: Positive for tremors. Negative for dizziness and headaches.  Psychiatric/Behavioral: Negative for depression. The patient is not nervous/anxious.     DRUG ALLERGIES:   Allergies  Allergen Reactions  . Nsaids Other (See Comments)    Duodenal ulcers, GI bleeding  . Other Other (See Comments)    Patient states he can't take any "mycin" medications because myasthenia gravis.   VITALS:  Blood pressure 130/88, pulse 86, temperature 98.6 F (37 C), resp. rate 12, height 5\' 11"  (1.803 m), weight 86.2 kg, SpO2 97 %. PHYSICAL EXAMINATION:  Physical Exam  GENERAL:  Laying in the bed with no acute distress.  HEENT: Head atraumatic, normocephalic. Pupils equal, round, reactive to light and accommodation. No scleral icterus. Extraocular muscles intact. Oropharynx and nasopharynx clear.  NECK:  Supple, no jugular venous distention. No thyroid enlargement. LUNGS: Lungs are clear to auscultation bilaterally. No wheezes, crackles, rhonchi. No use of accessory muscles of respiration.  CARDIOVASCULAR: Regular rate, regular rhythm, S1, S2 normal. No murmurs,  rubs, or gallops.  ABDOMEN: Soft, nontender, nondistended. Bowel sounds present.  EXTREMITIES: No pedal edema, cyanosis, or clubbing.  NEUROLOGIC: CN 2-12 intact, no focal deficits. 5/5 muscle strength throughout all extremities. Sensation intact throughout. Gait not checked. + tremor PSYCHIATRIC: The patient is alert and oriented x 3.  SKIN: No obvious rash, lesion, or ulcer.  LABORATORY PANEL:  Male CBC Recent Labs  Lab 08/18/18 0509  WBC 7.3  HGB 12.2*  HCT 36.0*  PLT 151   ------------------------------------------------------------------------------------------------------------------ Chemistries  Recent Labs  Lab 08/17/18 1138 08/18/18 0509 08/19/18 0425  NA 142 136 138  K 3.9 3.2* 3.2*  CL 100 95* 103  CO2 29 29 28   GLUCOSE 128* 116* 134*  BUN <5* 6 8  CREATININE 0.84 0.71 0.81  CALCIUM 8.0* 7.9* 8.0*  MG 1.8  --   --   AST 43* 32  --   ALT 14 12  --   ALKPHOS 104 93  --   BILITOT 1.3* 1.1  --    RADIOLOGY:  No results found. ASSESSMENT AND PLAN:   Atrial fibrillation with rapid ventricular rate- resolved with diltiazem and metoprolol.  Currently in normal sinus rhythm. -Continue home diltiazem and metoprolol -Metoprolol IV as needed for heart rate > 100 -Cardiac monitoring  Alcohol withdrawal -Continue scheduled Librium and Ativan IV as needed per CIWA protocol  Hypokalemia- K 3.0 -Replete and recheck -Check magnesium and phosphorus  Generalized weakness-likely secondary to chronic pain, chronic neuropathy, myasthenia gravis, deconditioning -PT following-recommending home health PT and rolling walker  Hypertension- improved -Continue home meds -IV metoprolol as needed for elevated blood pressures  Chronic intermittent diarrhea-patient  has not had a bowel movement in 48 hours -Discontinue stool studies -Discontinue enteric precautions  Myasthenia gravis-no signs of myasthenia crisis -Continue home CellCept  Type 2 diabetes- blood sugars  well-controlled  -Sliding scale insulin  DVT prophylaxis with Lovenox  All the records are reviewed and case discussed with Care Management/Social Worker. Management plans discussed with the patient, family and they are in agreement.  CODE STATUS: Full Code  TOTAL TIME TAKING CARE OF THIS PATIENT: 45 minutes.   More than 50% of the time was spent in counseling/coordination of care: YES  POSSIBLE D/C IN 1-2 DAYS, DEPENDING ON CLINICAL CONDITION.   Jinny BlossomKaty D Lillybeth Tal M.D on 08/19/2018 at 3:33 PM  Between 7am to 6pm - Pager - [phone]  After 6pm go to [email or web address] - Social research officer, governmentpassword EPAS [email or web address]  [facility] [place] [facility]  Office  [phone]  CC: Primary care physician; Barbette ReichmannHande, Vishwanath, MD  Note: This dictation was prepared with Dragon dictation along with smaller phrase technology. Any transcriptional errors that result from this process are unintentional.

## 2018-08-20 LAB — MAGNESIUM: Magnesium: 1.4 mg/dL — ABNORMAL LOW (ref 1.7–2.4)

## 2018-08-20 LAB — GLUCOSE, CAPILLARY
Glucose-Capillary: 115 mg/dL — ABNORMAL HIGH (ref 70–99)
Glucose-Capillary: 131 mg/dL — ABNORMAL HIGH (ref 70–99)

## 2018-08-20 LAB — CBC
HCT: 33.6 % — ABNORMAL LOW (ref 39.0–52.0)
Hemoglobin: 10.6 g/dL — ABNORMAL LOW (ref 13.0–17.0)
MCH: 29.1 pg (ref 26.0–34.0)
MCHC: 31.5 g/dL (ref 30.0–36.0)
MCV: 92.3 fL (ref 80.0–100.0)
Platelets: 121 10*3/uL — ABNORMAL LOW (ref 150–400)
RBC: 3.64 MIL/uL — ABNORMAL LOW (ref 4.22–5.81)
RDW: 19.4 % — ABNORMAL HIGH (ref 11.5–15.5)
WBC: 6.8 10*3/uL (ref 4.0–10.5)
nRBC: 0 % (ref 0.0–0.2)

## 2018-08-20 LAB — BASIC METABOLIC PANEL
Anion gap: 11 (ref 5–15)
BUN: 12 mg/dL (ref 6–20)
CO2: 26 mmol/L (ref 22–32)
Calcium: 8.8 mg/dL — ABNORMAL LOW (ref 8.9–10.3)
Chloride: 100 mmol/L (ref 98–111)
Creatinine, Ser: 0.71 mg/dL (ref 0.61–1.24)
GFR calc Af Amer: >60 mL/min (ref >60)
GFR calc non Af Amer: >60 mL/min (ref >60)
Glucose, Bld: 111 mg/dL — ABNORMAL HIGH (ref 70–99)
Potassium: 3.9 mmol/L (ref 3.5–5.1)
Sodium: 137 mmol/L (ref 135–145)

## 2018-08-20 LAB — PHOSPHORUS: Phosphorus: 3.8 mg/dL (ref 2.5–4.6)

## 2018-08-20 MED ORDER — ENOXAPARIN SODIUM 40 MG/0.4ML ~~LOC~~ SOLN: 40.0000 mg | SUBCUTANEOUS | Status: DC

## 2018-08-20 MED ORDER — MAGNESIUM SULFATE 4 GM/100ML IV SOLN
4.0000 g | Freq: Once | INTRAVENOUS | Status: AC
  Administered 2018-08-20: 4 g via INTRAVENOUS
  Filled 2018-08-20: qty 100

## 2018-08-20 NOTE — TOC Transition Note (Signed)
Transition of Care [facility]) - CM/SW Discharge Note   Patient Details  Name: Delphis Borak MRN: [id number] Date of Birth: 01/15/1961  [facility]) CM/SW Contact:  Sherren Kerns, RN Phone Number: 08/20/2018, 10:32 AM   Clinical Narrative:   Patient discharging to home with parents.  Declines home health services.      Final next level of care: Home/Self Care Barriers to Discharge: No Barriers Identified   Patient Goals and CMS Choice        Discharge Placement                       Discharge Plan and Services   Discharge Planning Services: CM Consult                      Social Determinants of Health (SDOH) Interventions     Readmission Risk Interventions Readmission Risk Prevention Plan 08/19/2018  Transportation Screening Complete  Medication Review (RN Care Manager) Complete  PCP or Specialist appointment within 3-5 days of discharge Complete  HRI or Home Care Consult Complete  SW Recovery Care/Counseling Consult Complete  Palliative Care Screening Not Applicable  Skilled Nursing Facility Not Applicable  Some recent data might be hidden

## 2018-08-20 NOTE — Discharge Summary (Addendum)
[facility] - [place] at [facility]   PATIENT NAME: Kurt Horton    MR#:  [id number]  DATE OF BIRTH:  [birth year]  DATE OF ADMISSION:  08/17/2018   ADMITTING PHYSICIAN: Milagros Loll, MD  DATE OF DISCHARGE: 08/20/18  PRIMARY CARE PHYSICIAN: [name], MD   ADMISSION DIAGNOSIS:  Alcohol abuse [F10.10] Alcohol withdrawal syndrome with complication (HCC) [F10.239] Alcoholic intoxication without complication (HCC) [F10.920] DISCHARGE DIAGNOSIS:  Active Problems:   A-fib (HCC)  SECONDARY DIAGNOSIS:   Past Medical History:  Diagnosis Date   A-fib (HCC)    Alcohol abuse    Alcohol withdrawal (HCC) 11/12/2016   Diabetes mellitus without complication (HCC)    DJD (degenerative joint disease)    Hypertension    Myasthenia gravis (HCC)    Renal disorder    HOSPITAL COURSE:   Dayle is a 58 year old male who presented to the ED with alcohol intoxication.  He was evaluated in the ED and labs were normal.  He was waiting for a ride home, when he suddenly went into alcohol withdrawal with hypertension and A. fib with RVR with heart rates in the 150s.  He was given Ativan, Cardizem, metoprolol and was admitted for further management.  Atrial fibrillation with rapid ventricular rate- resolved with diltiazem and metoprolol.   -Patient was in normal sinus rhythm for over 24 hours prior to discharge -Continued home diltiazem and metoprolol  Alcohol withdrawal -Treated with Librium and Ativan while in the hospital -Alcohol cessation counseling provided  Generalized weakness-likely secondary to chronic pain, chronic neuropathy, myasthenia gravis, deconditioning -PT following-recommended home health PT and rolling walker, but patient declined home health services  Hypertension- improved -Continued home meds  Chronic intermittent diarrhea-resolved  Myasthenia gravis- no signs of myasthenia crisis -Continued home CellCept  Type 2 diabetes- blood  sugars well-controlled  -Home diabetes meds restarted on discharge  Normocytic anemia- hgb dropped this admission, but close to baseline when reviewing previous labs. No active bleeding. -Recheck CBC as an outpatient  DISCHARGE CONDITIONS:  Alcohol abuse Generalized weakness Hypertension Chronic intermittent diarrhea Myasthenia gravis Type 2 diabetes CONSULTS OBTAINED:  None DRUG ALLERGIES:   Allergies  Allergen Reactions   Nsaids Other (See Comments)    Duodenal ulcers, GI bleeding   Other Other (See Comments)    Patient states he can't take any "mycin" medications because myasthenia gravis.   DISCHARGE MEDICATIONS:   Allergies as of 08/20/2018      Reactions   Nsaids Other (See Comments)   Duodenal ulcers, GI bleeding   Other Other (See Comments)   Patient states he can't take any "mycin" medications because myasthenia gravis.      Medication List    STOP taking these medications   cloNIDine 0.1 mg/24hr patch Commonly known as:  CATAPRES - Dosed in mg/24 hr     TAKE these medications   B-complex with vitamin C tablet Take 1 tablet by mouth daily.   diltiazem 120 MG 24 hr capsule Commonly known as:  CARDIZEM CD Take 1 capsule (120 mg total) by mouth daily.   folic acid 1 MG tablet Commonly known as:  FOLVITE Take 1 tablet (1 mg total) by mouth daily.   gabapentin 300 MG capsule Commonly known as:  NEURONTIN Take 2 capsules (600 mg total) by mouth 3 (three) times daily.   LORazepam 1 MG tablet Commonly known as:  ATIVAN Take 1 tablet (1 mg total) by mouth every 8 (eight) hours as needed for anxiety.   metFORMIN 500  MG tablet Commonly known as:  GLUCOPHAGE Take 1 tablet (500 mg total) by mouth daily.   metoprolol succinate 50 MG 24 hr tablet Commonly known as:  TOPROL-XL Take 1 tablet (50 mg total) by mouth daily. Take with or immediately following a meal.   multivitamin Tabs tablet Take 1 tablet by mouth daily.   mycophenolate 500 MG  tablet Commonly known as:  CELLCEPT Take 1 tablet (500 mg total) by mouth 2 (two) times daily. Take 1 tablet ( ) by mouth every morning and 2 tablets ( ) by mouth every evening What changed:    how much to take  when to take this   senna-docusate 8.6-50 MG tablet Commonly known as:  Senokot-S Take 1 tablet by mouth at bedtime as needed for mild constipation. What changed:  when to take this   testosterone cypionate 200 MG/ML injection Commonly known as:  DEPOTESTOSTERONE CYPIONATE Inject 200 mg into the muscle every 14 (fourteen) days.   thiamine 100 MG tablet Take 1 tablet (100 mg total) by mouth daily.   traZODone 100 MG tablet Commonly known as:  DESYREL Take 1 tablet (100 mg total) by mouth at bedtime.            Durable Medical Equipment  (From admission, onward)         Start     Ordered   08/19/18 1041  For home use only DME Walker rolling  Once    Question:  Patient needs a walker to treat with the following condition  Answer:  Generalized weakness   08/19/18 1041           DISCHARGE INSTRUCTIONS:  1.  Follow-up with PCP in 5 days 2.  Needs to follow-up in pain clinic for chronic pain 3.  Needs continued alcohol cessation counseling as an outpatient DIET:  Cardiac diet DISCHARGE CONDITION:  Stable ACTIVITY:  Activity as tolerated OXYGEN:  Home Oxygen: No.  Oxygen Delivery: room air DISCHARGE LOCATION:  home   If you experience worsening of your admission symptoms, develop shortness of breath, life threatening emergency, suicidal or homicidal thoughts you must seek medical attention immediately by calling 911 or calling your MD immediately  if symptoms less severe.  You Must read complete instructions/literature along with all the possible adverse reactions/side effects for all the Medicines you take and that have been prescribed to you. Take any new Medicines after you have completely understood and accpet all the possible adverse  reactions/side effects.   Please note  You were cared for by a hospitalist during your hospital stay. If you have any questions about your discharge medications or the care you received while you were in the hospital after you are discharged, you can call the unit and asked to speak with the hospitalist on call if the hospitalist that took care of you is not available. Once you are discharged, your primary care physician will handle any further medical issues. Please note that NO REFILLS for any discharge medications will be authorized once you are discharged, as it is imperative that you return to your primary care physician (or establish a relationship with a primary care physician if you do not have one) for your aftercare needs so that they can reassess your need for medications and monitor your lab values.    On the day of Discharge:  VITAL SIGNS:  Blood pressure 138/78, pulse 93, temperature 98.5 F (36.9 C), temperature source Oral, resp. rate 20, height  (1.803 m), weight 79.1 kg,  SpO2 99 %. PHYSICAL EXAMINATION:  GENERAL:  58 y.o.-year-old patient lying in the bed with no acute distress.  EYES: Pupils equal, round, reactive to light and accommodation. No scleral icterus. Extraocular muscles intact.  HEENT: Head atraumatic, normocephalic. Oropharynx and nasopharynx clear.  NECK:  Supple, no jugular venous distention. No thyroid enlargement, no tenderness.  LUNGS: Normal breath sounds bilaterally, no wheezing, rales,rhonchi or crepitation. No use of accessory muscles of respiration.  CARDIOVASCULAR: RRR, S1, S2 normal. No murmurs, rubs, or gallops.  ABDOMEN: Soft, non-tender, non-distended. Bowel sounds present. No organomegaly or mass.  EXTREMITIES: No pedal edema, cyanosis, or clubbing.  NEUROLOGIC: Cranial nerves II through XII are intact. Sensation intact. Gait not checked. +tremor PSYCHIATRIC: The patient is alert and oriented x 3.  SKIN: No obvious rash, lesion, or ulcer.    DATA REVIEW:   CBC Recent Labs  Lab 08/18/18 0509  WBC 7.3  HGB 12.2*  HCT 36.0*  PLT 151    Chemistries  Recent Labs  Lab 08/18/18 0509  08/20/18 0412  NA 136   < > 137  K 3.2*   < > 3.9  CL 95*   < > 100  CO2 29   < > 26  GLUCOSE 116*   < > 111*  BUN 6   < > 12  CREATININE 0.71   < > 0.71  CALCIUM 7.9*   < > 8.8*  MG  --   --  1.4*  AST 32  --   --   ALT 12  --   --   ALKPHOS 93  --   --   BILITOT 1.1  --   --    < > = values in this interval not displayed.     Microbiology Results  Results for orders placed or performed during the hospital encounter of 04/15/18  MRSA PCR Screening     Status: None   Collection Time: 04/15/18 11:53 AM  Result Value Ref Range Status   MRSA by PCR NEGATIVE NEGATIVE Final    Comment:        The GeneXpert MRSA Assay (FDA approved for NASAL specimens only), is one component of a comprehensive MRSA colonization surveillance program. It is not intended to diagnose MRSA infection nor to guide or monitor treatment for MRSA infections. Performed at [facility], [address], [place], [place] [address]     RADIOLOGY:  No results found.   Management plans discussed with the patient, family and they are in agreement.  CODE STATUS: Full Code   TOTAL TIME TAKING CARE OF THIS PATIENT: 40 minutes.    Jinny Blossom Kaio Kuhlman M.D on 08/20/2018 at 10:05 AM  Between 7am to 6pm - Pager - [phone]  After 6pm go to [email or web address] - Social research officer, government  [facility] [place] [facility]  Office  [phone]  CC: Primary care physician; [name], MD   Note: This dictation was prepared with Dragon dictation along with smaller phrase technology. Any transcriptional errors that result from this process are unintentional.

## 2018-08-20 NOTE — Progress Notes (Signed)
Physical Therapy Treatment Patient Details Name: Kurt Horton MRN: [id number] DOB: 1960/06/01 Today's Date: 08/20/2018    History of Present Illness 58 y/o M admitted with Afib, ETOH withdrawl. Pt has had frequent admissions at this facility for alcohol detox in the past year (>10). PMH includes chronic pain, myasthenia gravis, a-fib, hip replacement, lumbar DJD/DDD previosuly followed by chronic pain.     PT Comments    Pt in bed upon entry, appears more alert this morning that 1DA, but still has frank delayed processing, response time, and formulation of thought. Pt progresses AMB tolerance to 12400ft, but requires standing 60s rest break halfway. Pt has trouble with independent establishment of balance using RW, it positioned often too close to his body. Pt also still using a very slow shuffle for primary movement strategy, clearly unsafe without supervision. Pt making progress, still limited in general, but I suspect that his gait deviation/ataxia are complicated and multifactorial, especially in light of his ETOH abuse history. It is difficult at this time to formulate an acurrate prognosis for improvement in functional mobility without additional insight into the etiology of his impairment.      Follow Up Recommendations  Home health PT;Supervision/Assistance - 24 hour;Supervision for mobility/OOB(Pt is clearly appropriate for STR from a rehab standpoint, but likely not an option from an insurance standpoint; can be safe at home with close supervision for all xfers and AMB)     Equipment Recommendations  Rolling walker with 5" wheels    Recommendations for Other Services       Precautions / Restrictions Precautions Precautions: Fall Precaution Comments: continues to have balance difficulty Restrictions Weight Bearing Restrictions: No    Mobility  Bed Mobility Overal bed mobility: Modified Independent             General bed mobility comments: lethargic, requires 2x  amount of time to perform, appears less weak today that yesterday.   Transfers Overall transfer level: Needs assistance Equipment used: Rolling walker (2 wheeled) Transfers: Sit to/from Stand Sit to Stand: Supervision         General transfer comment: requires multiple attempts to rise successfully, rocking strategy, eventually coming up slowly  Ambulation/Gait Ambulation/Gait assistance: Min guard Gait Distance (Feet): 100 Feet Assistive device: Rolling walker (2 wheeled) Gait Pattern/deviations: Shuffle Gait velocity: 0.7143m/s  Gait velocity interpretation: <1.31 ft/sec, indicative of household ambulator General Gait Details: verbal cues for safe RW use (too close to body); cued to DC shuffle and engage larger steps which helps, but worsens RW use. VC for upright stance/posture   Stairs             Wheelchair Mobility    Modified Rankin (Stroke Patients Only)       Balance Overall balance assessment: Needs assistance Sitting-balance support: No upper extremity supported;Feet supported Sitting balance-Leahy Scale: Good     Standing balance support: During functional activity;Bilateral upper extremity supported Standing balance-Leahy Scale: Poor Standing balance comment: poor RW strategy, still reverts to fixed objkect for stability when he has a LOB                            Cognition Arousal/Alertness: Awake/alert Behavior During Therapy: WFL for tasks assessed/performed(slightly disoriented, to time of day; slow processsing, slowed speech, delayed response time. ) Overall Cognitive Status: No family/caregiver present to determine baseline cognitive functioning(WFL for task. )  Exercises      General Comments        Pertinent Vitals/Pain Pain Assessment: Faces Faces Pain Scale: Hurts little more Pain Location: his chronic back pain, knee pain, reports adequately controlled with pain  meds Pain Descriptors / Indicators: Aching Pain Intervention(s): Limited activity within patient's tolerance    Home Living                      Prior Function            PT Goals (current goals can now be found in the care plan section) Acute Rehab PT Goals Patient Stated Goal: Pt reports he really wants to work hard to get into better shape PT Goal Formulation: With patient Time For Goal Achievement: 09/01/18 Potential to Achieve Goals: Fair Progress towards PT goals: Progressing toward goals    Frequency    Min 2X/week      PT Plan Current plan remains appropriate    Co-evaluation              AM-PAC PT "6 Clicks" Mobility   Outcome Measure  Help needed turning from your back to your side while in a flat bed without using bedrails?: None Help needed moving from lying on your back to sitting on the side of a flat bed without using bedrails?: None Help needed moving to and from a bed to a chair (including a wheelchair)?: A Little Help needed standing up from a chair using your arms (e.g., wheelchair or bedside chair)?: A Little Help needed to walk in hospital room?: A Little Help needed climbing 3-5 steps with a railing? : A Lot 6 Click Score: 19    End of Session Equipment Utilized During Treatment: Gait belt Activity Tolerance: Patient tolerated treatment well;Patient limited by fatigue;Other (comment)(limited by what appears to be slowed cognitive processing. ) Patient left: with chair alarm set;with call bell/phone within reach Nurse Communication: Mobility status PT Visit Diagnosis: Muscle weakness (generalized) (M62.81);Repeated falls (R29.6);Unsteadiness on feet (R26.81);Other abnormalities of gait and mobility (R26.89);History of falling (Z91.81)     Time: 5427-0623 PT Time Calculation (min) (ACUTE ONLY): 33 min  Charges:  $Gait Training: 8-22 mins $Therapeutic Exercise: 8-22 mins                     9:50 AM, 08/20/18 Rosamaria Lints,  PT, DPT Physical Therapist - [facility]  [phone] (ASCOM)    Buccola,[name] 08/20/2018, 9:43 AM

## 2018-08-20 NOTE — Progress Notes (Signed)
No adverse effects from his fall.  He is not complaining about any pain.  He is annoyed that he had to argue with his parents to get them to send his clothes.  He is very unstable on his feet, but he has been for the last several admissions.  He was discharged to home with his private duty home health aide. He has no new medications.  He has to make follow up appointments since the physicians have limited office times.

## 2018-08-20 NOTE — Progress Notes (Signed)
At about 1400, Kurt Horton was found on the floor.  He says he was trying to walk around the bed to get his belonging he is taking home when discharged and slipped.  He had been in the chair - the chair alarm was off. He says he hit his side but that "it doesn't really hurt".  Dr. Nancy Marus notified.  Asked that we observe him for a while and then discharge him

## 2018-08-26 ENCOUNTER — Other Ambulatory Visit: Payer: Self-pay

## 2018-08-26 ENCOUNTER — Encounter: Payer: Self-pay | Admitting: Emergency Medicine

## 2018-08-26 ENCOUNTER — Inpatient Hospital Stay
Admission: EM | Admit: 2018-08-26 | Discharge: 2018-08-28 | DRG: 897 | Disposition: A | Discharge status: Home Health | Payer: Medicare Other | Attending: Internal Medicine | Admitting: Internal Medicine

## 2018-08-26 DIAGNOSIS — Z7984 Long term (current) use of oral hypoglycemic drugs: Secondary | ICD-10-CM | POA: Diagnosis not present

## 2018-08-26 DIAGNOSIS — F10930 Alcohol use, unspecified with withdrawal, uncomplicated: Secondary | ICD-10-CM

## 2018-08-26 DIAGNOSIS — Z9114 Patient's other noncompliance with medication regimen: Secondary | ICD-10-CM | POA: Diagnosis not present

## 2018-08-26 DIAGNOSIS — I480 Paroxysmal atrial fibrillation: Secondary | ICD-10-CM | POA: Diagnosis present

## 2018-08-26 DIAGNOSIS — Y906 Blood alcohol level of 120-199 mg/100 ml: Secondary | ICD-10-CM | POA: Diagnosis present

## 2018-08-26 DIAGNOSIS — K529 Noninfective gastroenteritis and colitis, unspecified: Secondary | ICD-10-CM | POA: Diagnosis present

## 2018-08-26 DIAGNOSIS — Z9119 Patient's noncompliance with other medical treatment and regimen: Secondary | ICD-10-CM

## 2018-08-26 DIAGNOSIS — Z79899 Other long term (current) drug therapy: Secondary | ICD-10-CM | POA: Diagnosis not present

## 2018-08-26 DIAGNOSIS — Z89422 Acquired absence of other left toe(s): Secondary | ICD-10-CM

## 2018-08-26 DIAGNOSIS — Z8261 Family history of arthritis: Secondary | ICD-10-CM | POA: Diagnosis not present

## 2018-08-26 DIAGNOSIS — F10230 Alcohol dependence with withdrawal, uncomplicated: Secondary | ICD-10-CM

## 2018-08-26 DIAGNOSIS — F17210 Nicotine dependence, cigarettes, uncomplicated: Secondary | ICD-10-CM | POA: Diagnosis present

## 2018-08-26 DIAGNOSIS — R000 Tachycardia, unspecified: Secondary | ICD-10-CM | POA: Diagnosis present

## 2018-08-26 DIAGNOSIS — E872 Acidosis: Secondary | ICD-10-CM | POA: Diagnosis present

## 2018-08-26 DIAGNOSIS — G7000 Myasthenia gravis without (acute) exacerbation: Secondary | ICD-10-CM | POA: Diagnosis present

## 2018-08-26 DIAGNOSIS — E119 Type 2 diabetes mellitus without complications: Secondary | ICD-10-CM | POA: Diagnosis present

## 2018-08-26 DIAGNOSIS — I10 Essential (primary) hypertension: Secondary | ICD-10-CM | POA: Diagnosis present

## 2018-08-26 DIAGNOSIS — F10239 Alcohol dependence with withdrawal, unspecified: Secondary | ICD-10-CM | POA: Diagnosis present

## 2018-08-26 DIAGNOSIS — F10939 Alcohol use, unspecified with withdrawal, unspecified: Secondary | ICD-10-CM | POA: Diagnosis present

## 2018-08-26 LAB — GLUCOSE, CAPILLARY
Glucose-Capillary: 132 mg/dL — ABNORMAL HIGH (ref 70–99)
Glucose-Capillary: 141 mg/dL — ABNORMAL HIGH (ref 70–99)
Glucose-Capillary: 139 mg/dL — ABNORMAL HIGH (ref 70–99)

## 2018-08-26 LAB — CBC WITH DIFFERENTIAL/PLATELET
Abs Immature Granulocytes: 0.04 10*3/uL (ref 0.00–0.07)
Basophils Absolute: 0.1 10*3/uL (ref 0.0–0.1)
Basophils Relative: 3 %
Eosinophils Absolute: 0 10*3/uL (ref 0.0–0.5)
Eosinophils Relative: 1 %
HCT: 38.2 % — ABNORMAL LOW (ref 39.0–52.0)
Hemoglobin: 12.4 g/dL — ABNORMAL LOW (ref 13.0–17.0)
Immature Granulocytes: 1 %
Lymphocytes Relative: 43 %
Lymphs Abs: 1.5 10*3/uL (ref 0.7–4.0)
MCH: 29.2 pg (ref 26.0–34.0)
MCHC: 32.5 g/dL (ref 30.0–36.0)
MCV: 90.1 fL (ref 80.0–100.0)
Monocytes Absolute: 0.7 10*3/uL (ref 0.1–1.0)
Monocytes Relative: 19 %
Neutro Abs: 1.2 10*3/uL — ABNORMAL LOW (ref 1.7–7.7)
Neutrophils Relative %: 33 %
Platelets: 290 10*3/uL (ref 150–400)
RBC: 4.24 MIL/uL (ref 4.22–5.81)
RDW: 19.5 % — ABNORMAL HIGH (ref 11.5–15.5)
WBC: 3.5 10*3/uL — ABNORMAL LOW (ref 4.0–10.5)
nRBC: 0 % (ref 0.0–0.2)

## 2018-08-26 LAB — COMPREHENSIVE METABOLIC PANEL
ALT: 29 U/L (ref 0–44)
AST: 39 U/L (ref 15–41)
Albumin: 3.5 g/dL (ref 3.5–5.0)
Alkaline Phosphatase: 86 U/L (ref 38–126)
Anion gap: 13 (ref 5–15)
BUN: 6 mg/dL (ref 6–20)
CO2: 26 mmol/L (ref 22–32)
Calcium: 8.4 mg/dL — ABNORMAL LOW (ref 8.9–10.3)
Chloride: 102 mmol/L (ref 98–111)
Creatinine, Ser: 0.88 mg/dL (ref 0.61–1.24)
GFR calc Af Amer: >60 mL/min (ref >60)
GFR calc non Af Amer: >60 mL/min (ref >60)
Glucose, Bld: 141 mg/dL — ABNORMAL HIGH (ref 70–99)
Potassium: 3.4 mmol/L — ABNORMAL LOW (ref 3.5–5.1)
Sodium: 141 mmol/L (ref 135–145)
Total Bilirubin: 0.5 mg/dL (ref 0.3–1.2)
Total Protein: 6.6 g/dL (ref 6.5–8.1)

## 2018-08-26 LAB — ETHANOL: Alcohol, Ethyl (B): 184 mg/dL — ABNORMAL HIGH (ref <10)

## 2018-08-26 LAB — LACTIC ACID, PLASMA
Lactic Acid, Venous: 3.6 mmol/L (ref 0.5–1.9)
Lactic Acid, Venous: 2.7 mmol/L (ref 0.5–1.9)

## 2018-08-26 MED ORDER — LORAZEPAM 2 MG/ML IJ SOLN
1.0000 mg | Freq: Once | INTRAMUSCULAR | Status: DC
  Filled 2018-08-26: qty 1

## 2018-08-26 MED ORDER — MYCOPHENOLATE MOFETIL 250 MG PO CAPS
1000.0000 mg | ORAL_CAPSULE | Freq: Every day | ORAL | Status: DC
  Administered 2018-08-26 – 2018-08-27 (×2): 1000 mg via ORAL
  Filled 2018-08-26 (×4): qty 4

## 2018-08-26 MED ORDER — VITAMIN B-1 100 MG PO TABS
100.0000 mg | ORAL_TABLET | Freq: Every day | ORAL | Status: DC
  Administered 2018-08-27 – 2018-08-28 (×2): 100 mg via ORAL
  Filled 2018-08-26 (×2): qty 1

## 2018-08-26 MED ORDER — ACETAMINOPHEN 325 MG PO TABS
650.0000 mg | ORAL_TABLET | Freq: Four times a day (QID) | ORAL | Status: DC | PRN
  Administered 2018-08-27 (×2): 650 mg via ORAL
  Filled 2018-08-26 (×2): qty 2

## 2018-08-26 MED ORDER — NICOTINE 14 MG/24HR TD PT24
14.0000 mg | MEDICATED_PATCH | Freq: Every day | TRANSDERMAL | Status: DC
  Administered 2018-08-26 – 2018-08-28 (×3): 14 mg via TRANSDERMAL
  Filled 2018-08-26 (×3): qty 1

## 2018-08-26 MED ORDER — THIAMINE HCL 100 MG/ML IJ SOLN
100.0000 mg | Freq: Every day | INTRAMUSCULAR | Status: DC
  Administered 2018-08-26: 100 mg via INTRAVENOUS
  Filled 2018-08-26: qty 2

## 2018-08-26 MED ORDER — LORAZEPAM 2 MG/ML IJ SOLN
2.0000 mg | Freq: Once | INTRAMUSCULAR | Status: AC
  Administered 2018-08-26: 2 mg via INTRAVENOUS
  Filled 2018-08-26: qty 1

## 2018-08-26 MED ORDER — ALBUTEROL SULFATE (2.5 MG/3ML) 0.083% IN NEBU: 2.5000 mg | INHALATION_SOLUTION | RESPIRATORY_TRACT | Status: DC | PRN

## 2018-08-26 MED ORDER — METOPROLOL SUCCINATE ER 25 MG PO TB24
25.0000 mg | ORAL_TABLET | Freq: Every day | ORAL | Status: DC
  Administered 2018-08-26 – 2018-08-28 (×3): 25 mg via ORAL
  Filled 2018-08-26 (×3): qty 1

## 2018-08-26 MED ORDER — MYCOPHENOLATE MOFETIL 250 MG PO CAPS
500.0000 mg | ORAL_CAPSULE | Freq: Every morning | ORAL | Status: DC
  Administered 2018-08-27 – 2018-08-28 (×2): 500 mg via ORAL
  Filled 2018-08-26 (×2): qty 2

## 2018-08-26 MED ORDER — POTASSIUM CHLORIDE CRYS ER 20 MEQ PO TBCR
40.0000 meq | EXTENDED_RELEASE_TABLET | ORAL | Status: AC
  Administered 2018-08-26 (×2): 40 meq via ORAL
  Filled 2018-08-26 (×2): qty 2

## 2018-08-26 MED ORDER — FOLIC ACID 1 MG PO TABS
1.0000 mg | ORAL_TABLET | Freq: Every day | ORAL | Status: DC
  Administered 2018-08-26 – 2018-08-28 (×3): 1 mg via ORAL
  Filled 2018-08-26 (×3): qty 1

## 2018-08-26 MED ORDER — CHLORDIAZEPOXIDE HCL 25 MG PO CAPS
50.0000 mg | ORAL_CAPSULE | Freq: Once | ORAL | Status: AC
  Administered 2018-08-26: 50 mg via ORAL
  Filled 2018-08-26: qty 2

## 2018-08-26 MED ORDER — MYCOPHENOLATE MOFETIL 500 MG PO TABS: 500.0000 mg | ORAL_TABLET | Freq: Two times a day (BID) | ORAL | Status: DC

## 2018-08-26 MED ORDER — OXYCODONE HCL 5 MG PO TABS
10.0000 mg | ORAL_TABLET | Freq: Three times a day (TID) | ORAL | Status: DC | PRN
  Administered 2018-08-26 – 2018-08-27 (×5): 10 mg via ORAL
  Filled 2018-08-26 (×5): qty 2

## 2018-08-26 MED ORDER — SODIUM CHLORIDE 0.9 % IV BOLUS
1000.0000 mL | Freq: Once | INTRAVENOUS | Status: AC
  Administered 2018-08-26: 1000 mL via INTRAVENOUS

## 2018-08-26 MED ORDER — THIAMINE HCL 100 MG/ML IJ SOLN
Freq: Once | INTRAVENOUS | Status: AC
  Administered 2018-08-26: 07:00:00 via INTRAVENOUS
  Filled 2018-08-26: qty 1000

## 2018-08-26 MED ORDER — ONDANSETRON HCL 4 MG/2ML IJ SOLN: 4.0000 mg | Freq: Four times a day (QID) | INTRAMUSCULAR | Status: DC | PRN

## 2018-08-26 MED ORDER — LORAZEPAM 2 MG/ML IJ SOLN
1.0000 mg | Freq: Four times a day (QID) | INTRAMUSCULAR | Status: DC | PRN
  Administered 2018-08-26 – 2018-08-27 (×3): 1 mg via INTRAVENOUS
  Filled 2018-08-26 (×3): qty 1

## 2018-08-26 MED ORDER — LACTATED RINGERS IV SOLN
INTRAVENOUS | Status: DC
  Administered 2018-08-26 – 2018-08-27 (×2): via INTRAVENOUS

## 2018-08-26 MED ORDER — LORAZEPAM 2 MG/ML IJ SOLN: 1.0000 mg | Freq: Once | INTRAMUSCULAR | Status: DC

## 2018-08-26 MED ORDER — ADULT MULTIVITAMIN W/MINERALS CH
1.0000 | ORAL_TABLET | Freq: Every day | ORAL | Status: DC
  Administered 2018-08-26 – 2018-08-28 (×3): 1 via ORAL
  Filled 2018-08-26 (×3): qty 1

## 2018-08-26 MED ORDER — ACETAMINOPHEN 650 MG RE SUPP: 650.0000 mg | Freq: Four times a day (QID) | RECTAL | Status: DC | PRN

## 2018-08-26 MED ORDER — CHLORDIAZEPOXIDE HCL 5 MG PO CAPS
25.0000 mg | ORAL_CAPSULE | Freq: Three times a day (TID) | ORAL | Status: DC
  Administered 2018-08-26 – 2018-08-28 (×6): 25 mg via ORAL
  Filled 2018-08-26 (×6): qty 5

## 2018-08-26 MED ORDER — TRAZODONE HCL 100 MG PO TABS
100.0000 mg | ORAL_TABLET | Freq: Every day | ORAL | Status: DC
  Administered 2018-08-27 (×2): 100 mg via ORAL
  Filled 2018-08-26 (×4): qty 1

## 2018-08-26 MED ORDER — INSULIN ASPART 100 UNIT/ML ~~LOC~~ SOLN
0.0000 [IU] | Freq: Three times a day (TID) | SUBCUTANEOUS | Status: DC
  Administered 2018-08-26 – 2018-08-27 (×5): 1 [IU] via SUBCUTANEOUS
  Filled 2018-08-26 (×5): qty 1

## 2018-08-26 MED ORDER — LORAZEPAM 2 MG/ML IJ SOLN
2.0000 mg | Freq: Once | INTRAMUSCULAR | Status: AC
  Administered 2018-08-26: 06:00:00 2 mg via INTRAVENOUS

## 2018-08-26 MED ORDER — LOPERAMIDE HCL 2 MG PO CAPS
2.0000 mg | ORAL_CAPSULE | Freq: Four times a day (QID) | ORAL | Status: DC | PRN
  Administered 2018-08-26: 15:00:00 2 mg via ORAL
  Filled 2018-08-26: qty 1

## 2018-08-26 MED ORDER — LORAZEPAM 1 MG PO TABS: 1.0000 mg | ORAL_TABLET | Freq: Four times a day (QID) | ORAL | Status: DC | PRN

## 2018-08-26 MED ORDER — ACETAMINOPHEN 325 MG PO TABS
650.0000 mg | ORAL_TABLET | Freq: Four times a day (QID) | ORAL | Status: DC | PRN
  Administered 2018-08-26: 12:00:00 650 mg via ORAL
  Filled 2018-08-26: qty 2

## 2018-08-26 MED ORDER — POLYETHYLENE GLYCOL 3350 17 G PO PACK: 17.0000 g | PACK | Freq: Every day | ORAL | Status: DC | PRN

## 2018-08-26 MED ORDER — ENOXAPARIN SODIUM 40 MG/0.4ML ~~LOC~~ SOLN
40.0000 mg | SUBCUTANEOUS | Status: DC
  Administered 2018-08-26 – 2018-08-27 (×2): 40 mg via SUBCUTANEOUS
  Filled 2018-08-26 (×2): qty 0.4

## 2018-08-26 MED ORDER — ONDANSETRON HCL 4 MG PO TABS: 4.0000 mg | ORAL_TABLET | Freq: Four times a day (QID) | ORAL | Status: DC | PRN

## 2018-08-26 MED ORDER — DILTIAZEM HCL ER COATED BEADS 120 MG PO CP24
120.0000 mg | ORAL_CAPSULE | Freq: Every day | ORAL | Status: DC
  Administered 2018-08-26 – 2018-08-28 (×3): 120 mg via ORAL
  Filled 2018-08-26 (×3): qty 1

## 2018-08-26 NOTE — ED Notes (Addendum)
Pt says now his last alcohol intake was this morning; pt says the liquor store was closed yesterday so he's been drinking beer; while going over medications pt admits to not taking his prescribed medications for some time; unable to give reason why

## 2018-08-26 NOTE — ED Notes (Signed)
ED TO INPATIENT HANDOFF REPORT  ED Nurse Name and Phone #: Dervin Vore [phone]  S Name/Age/Gender Kurt Horton 58 y.o. male Room/Bed: ED15A/ED15A  Code Status   Code Status: Full Code  Home/SNF/Other home Patient oriented x 4  Is this baseline? yes  Triage Complete: Triage complete  Chief Complaint withdrawal  Triage Note Pt arrived via EMS from his parents house; here for withdrawal from alcohol; says he's shaking "real bad inside"; pt is normally a daily drinker but says he hasn't had any alcohol in 2 days; c/o chronic low back pain;   Allergies Allergies  Allergen Reactions  . Nsaids Other (See Comments)    Duodenal ulcers, GI bleeding  . Other Other (See Comments)    Patient states he can't take any "mycin" medications because myasthenia gravis.    Level of Care/Admitting Diagnosis ED Disposition    ED Disposition Condition Comment   Admit  Hospital Area: [facility] [[id number]]  Level of Care: Med-Surg [16]  Covid Evaluation: N/A  Diagnosis: Alcohol withdrawal (HCC) [291.81.ICD-9-CM]  Admitting Physician: Milagros Loll [[id number]]  Attending Physician: Milagros Loll [[id number]]  Estimated length of stay: past midnight tomorrow  Certification:: I certify this patient will need inpatient services for at least 2 midnights  PT Class (Do Not Modify): Inpatient [101]  PT Acc Code (Do Not Modify): Private [1]       B Medical/Surgery History Past Medical History:  Diagnosis Date  . A-fib (HCC)   . Alcohol abuse   . Alcohol withdrawal (HCC) 11/12/2016  . Diabetes mellitus without complication (HCC)   . DJD (degenerative joint disease)   . Hypertension   . Myasthenia gravis (HCC)   . Renal disorder    Past Surgical History:  Procedure Laterality Date  . AMPUTATION TOE Left 12/29/2015   Procedure: AMPUTATION TOE;  Surgeon: Gwyneth Revels, DPM;  Location: [facility] ORS;  Service: Podiatry;  Laterality: Left;  . ESOPHAGOGASTRODUODENOSCOPY N/A 02/05/2018    Procedure: ESOPHAGOGASTRODUODENOSCOPY (EGD);  Surgeon: Pasty Spillers, MD;  Location: [facility] ENDOSCOPY;  Service: Endoscopy;  Laterality: N/A;  . PERIPHERAL VASCULAR CATHETERIZATION N/A 12/27/2015   Procedure: Lower Extremity Angiography;  Surgeon: Annice Needy, MD;  Location: [facility] INVASIVE CV LAB;  Service: Cardiovascular;  Laterality: N/A;  . right hip surgery    . right shoulder surgery       A IV Location/Drains/Wounds Patient Lines/Drains/Airways Status   Active Line/Drains/Airways    Name:   Placement date:   Placement time:   Site:   Days:   Peripheral IV 08/26/18 Right Other (Comment)   08/26/18    0610    Other (Comment)   less than 1          Intake/Output Last 24 hours  Intake/Output Summary (Last 24 hours) at 08/26/2018 1121 Last data filed at 08/26/2018 1117 Gross per 24 hour  Intake 1000 ml  Output 300 ml  Net 700 ml    Labs/Imaging Results for orders placed or performed during the hospital encounter of 08/26/18 (from the past 48 hour(s))  Comprehensive metabolic panel     Status: Abnormal   Collection Time: 08/26/18  6:16 AM  Result Value Ref Range   Sodium 141 135 - 145 mmol/L   Potassium 3.4 (L) 3.5 - 5.1 mmol/L   Chloride 102 98 - 111 mmol/L   CO2 26 22 - 32 mmol/L   Glucose, Bld 141 (H) 70 - 99 mg/dL   BUN 6 6 - 20 mg/dL   Creatinine,  Ser 0.88 0.61 - 1.24 mg/dL   Calcium 8.4 (L) 8.9 - 10.3 mg/dL   Total Protein 6.6 6.5 - 8.1 g/dL   Albumin 3.5 3.5 - 5.0 g/dL   AST 39 15 - 41 U/L   ALT 29 0 - 44 U/L   Alkaline Phosphatase 86 38 - 126 U/L   Total Bilirubin 0.5 0.3 - 1.2 mg/dL   GFR calc non Af Amer >60 >60 mL/min   GFR calc Af Amer >60 >60 mL/min   Anion gap 13 5 - 15    Comment: Performed at [facility], [address] [place], [place] [address]  CBC with Differential     Status: Abnormal   Collection Time: 08/26/18  6:16 AM  Result Value Ref Range   WBC 3.5 (L) 4.0 - 10.5 K/uL   RBC 4.24 4.22 - 5.81 MIL/uL   Hemoglobin 12.4 (L)  13.0 - 17.0 g/dL   HCT 60.438.2 (L) 54.039.0 - 98.152.0 %   MCV 90.1 80.0 - 100.0 fL   MCH 29.2 26.0 - 34.0 pg   MCHC 32.5 30.0 - 36.0 g/dL   RDW 19.119.5 (H) 47.811.5 - 29.515.5 %   Platelets 290 150 - 400 K/uL   nRBC 0.0 0.0 - 0.2 %   Neutrophils Relative % 33 %   Neutro Abs 1.2 (L) 1.7 - 7.7 K/uL   Lymphocytes Relative 43 %   Lymphs Abs 1.5 0.7 - 4.0 K/uL   Monocytes Relative 19 %   Monocytes Absolute 0.7 0.1 - 1.0 K/uL   Eosinophils Relative 1 %   Eosinophils Absolute 0.0 0.0 - 0.5 K/uL   Basophils Relative 3 %   Basophils Absolute 0.1 0.0 - 0.1 K/uL   Immature Granulocytes 1 %   Abs Immature Granulocytes 0.04 0.00 - 0.07 K/uL    Comment: Performed at [facility], [address] [place], [place] [address]  Lactic acid, plasma     Status: Abnormal   Collection Time: 08/26/18  7:08 AM  Result Value Ref Range   Lactic Acid, Venous 3.6 (HH) 0.5 - 1.9 mmol/L    Comment: CRITICAL RESULT CALLED TO, READ BACK BY AND VERIFIED WITH LAURIE Lizania Bouchard @0805  08/26/18 MJU/JJB Performed at [facility], [address] [place], [place] [address]   Ethanol     Status: Abnormal   Collection Time: 08/26/18  7:08 AM  Result Value Ref Range   Alcohol, Ethyl (B) 184 (H) <10 mg/dL    Comment: (NOTE) Lowest detectable limit for serum alcohol is 10 mg/dL. For medical purposes only. Performed at [facility], [address] [place], [place] [address]    No results found.  Pending Labs Unresulted Labs (From admission, onward)    Start     Ordered   09/02/18 0500  Creatinine, serum  (enoxaparin (LOVENOX)    CrCl >/= 30 ml/min)  Weekly,   STAT    Comments:  while on enoxaparin therapy    08/26/18 1055   08/27/18 0500  Comprehensive metabolic panel  Tomorrow morning,   STAT     08/26/18 1055   08/27/18 0500  CBC  Tomorrow morning,   STAT     08/26/18 1055   08/27/18 0500  Protime-INR  Tomorrow morning,   STAT     08/26/18 1055   08/26/18 1200  Lactic acid, plasma  Once-Timed,   STAT      08/26/18 1051          Vitals/Pain Today's Vitals  08/26/18 0843 08/26/18 0900 08/26/18 1000 08/26/18 1100  BP: (!) 147/73 (!) 145/97 (!) 148/96 (!) 140/92  Pulse: (!) 110 (!) 112 (!) 101 (!) 114  Resp: Temp:      TempSrc:      SpO2: 97% 95% 97% 97%  Weight:      Height:      PainSc:        Isolation Precautions No active isolations  Medications Medications  sodium chloride 0.9 % bolus 1,000 mL (has no administration in time range)  LORazepam (ATIVAN) tablet 1 mg (has no administration in time range)    Or  LORazepam (ATIVAN) injection 1 mg (has no administration in time range)  thiamine (VITAMIN B-1) tablet 100 mg (has no administration in time range)    Or  thiamine (B-1) injection 100 mg (has no administration in time range)  folic acid (FOLVITE) tablet 1 mg (has no administration in time range)  multivitamin with minerals tablet 1 tablet (has no administration in time range)  insulin aspart (novoLOG) injection 0-9 Units (has no administration in time range)  enoxaparin (LOVENOX) injection 40 mg (has no administration in time range)  acetaminophen (TYLENOL) tablet 650 mg (has no administration in time range)    Or  acetaminophen (TYLENOL) suppository 650 mg (has no administration in time range)  polyethylene glycol (MIRALAX / GLYCOLAX) packet 17 g (has no administration in time range)  ondansetron (ZOFRAN) tablet 4 mg (has no administration in time range)    Or  ondansetron (ZOFRAN) injection 4 mg (has no administration in time range)  albuterol (PROVENTIL) (2.5 MG/3ML) 0.083% nebulizer solution 2.5 mg (has no administration in time range)  chlordiazePOXIDE (LIBRIUM) capsule 25 mg (has no administration in time range)  potassium chloride SA (K-DUR) CR tablet 40 mEq (has no administration in time range)  lactated ringers infusion (has no administration in time range)  diltiazem (CARDIZEM CD) 24 hr capsule 120 mg (has no administration in time range)   metoprolol succinate (TOPROL-XL) 24 hr tablet 25 mg (has no administration in time range)  sodium chloride 0.9 % 1,000 mL with thiamine 100 mg, folic acid 1 mg, multivitamins adult 10 mL infusion ( Intravenous New Bag/Given 08/26/18 0658)  LORazepam (ATIVAN) injection 2 mg (2 mg Intravenous Given 08/26/18 0623)  sodium chloride 0.9 % bolus 1,000 mL (0 mLs Intravenous Stopped 08/26/18 1117)  chlordiazePOXIDE (LIBRIUM) capsule 50 mg (50 mg Oral Given 08/26/18 0941)  LORazepam (ATIVAN) injection 2 mg (2 mg Intravenous Given 08/26/18 1014)    Mobility High fall risk

## 2018-08-26 NOTE — ED Provider Notes (Signed)
[facility] Emergency Department Provider Note   ____________________________________________   First MD Initiated Contact with Patient 08/26/18 [phone]     (approximate)  I have reviewed the triage vital signs and the nursing notes.   HISTORY  Chief Complaint Withdrawal    HPI Kurt Horton is a 58 y.o. male patient reports he usually drinks about 20 airplane bottles of rum a day.  Is not had any alcohol for about 2 days and he is getting very shaky and withdrawing.  He has a history of alcohol withdrawal in the past.         Past Medical History:  Diagnosis Date  . A-fib (HCC)   . Alcohol abuse   . Alcohol withdrawal (HCC) 11/12/2016  . Diabetes mellitus without complication (HCC)   . DJD (degenerative joint disease)   . Hypertension   . Myasthenia gravis (HCC)   . Renal disorder     Patient Active Problem List   Diagnosis Date Noted  . A-fib (HCC) 08/17/2018  . Tobacco use disorder 05/27/2018  . GI bleed 02/04/2018  . Alcohol intoxication (HCC) 12/22/2017  . Malnutrition of moderate degree 22-Nov-202019  . Chronic combined systolic and diastolic CHF (congestive heart failure) (HCC) 08/13/2017  . Alcohol withdrawal delirium (HCC) 02/26/2017  . Atrial fibrillation with RVR (HCC) 02/25/2017  . Acute alcoholic intoxication with complication (HCC)   . Alcohol use disorder, severe, dependence (HCC)   . Substance induced mood disorder (HCC) 11/13/2016  . SVT (supraventricular tachycardia) (HCC) 11/12/2016  . HTN (hypertension) 11/12/2016  . EtOH dependence (HCC) 11/12/2016  . Lumbar radiculopathy 08/24/2016  . Major depressive disorder, recurrent severe without psychotic features (HCC) 08/24/2016  . Chronic low back pain 01/04/2016  . Pressure ulcer 12/29/2015  . Osteomyelitis (HCC) 12/26/2015  . PVD (peripheral vascular disease) (HCC) 12/26/2015  . Type II diabetes mellitus with manifestations (HCC) 12/26/2015  . Myasthenia gravis (HCC)  12/26/2015    Past Surgical History:  Procedure Laterality Date  . AMPUTATION TOE Left 12/29/2015   Procedure: AMPUTATION TOE;  Surgeon: Gwyneth Revels, DPM;  Location: [facility] ORS;  Service: Podiatry;  Laterality: Left;  . ESOPHAGOGASTRODUODENOSCOPY N/A 02/05/2018   Procedure: ESOPHAGOGASTRODUODENOSCOPY (EGD);  Surgeon: Pasty Spillers, MD;  Location: [facility] ENDOSCOPY;  Service: Endoscopy;  Laterality: N/A;  . PERIPHERAL VASCULAR CATHETERIZATION N/A 12/27/2015   Procedure: Lower Extremity Angiography;  Surgeon: Annice Needy, MD;  Location: [facility] INVASIVE CV LAB;  Service: Cardiovascular;  Laterality: N/A;  . right hip surgery    . right shoulder surgery      Prior to Admission medications   Medication Sig Start Date End Date Taking? Authorizing Provider  diltiazem (CARDIZEM CD) 120 MG 24 hr capsule Take 1 capsule (120 mg total) by mouth daily. 06/29/18  Yes Altamese Dilling, MD  folic acid (FOLVITE) 1 MG tablet Take 1 tablet (1 mg total) by mouth daily. 06/29/18  Yes Altamese Dilling, MD  gabapentin (NEURONTIN) 300 MG capsule Take 2 capsules (600 mg total) by mouth 3 (three) times daily. 05/30/18  Yes Pucilowska, Jolanta B, MD  LORazepam (ATIVAN) 1 MG tablet Take 1 tablet (1 mg total) by mouth every 8 (eight) hours as needed for anxiety. 06/28/18  Yes Altamese Dilling, MD  metFORMIN (GLUCOPHAGE) 500 MG tablet Take 1 tablet (500 mg total) by mouth daily. 05/30/18  Yes Pucilowska, Jolanta B, MD  metoprolol succinate (TOPROL-XL) 50 MG 24 hr tablet Take 1 tablet (50 mg total) by mouth daily. Take with or immediately  following a meal. 05/30/18  Yes Pucilowska, Jolanta B, MD  multivitamin (ONE-A-DAY MEN'S) TABS tablet Take 1 tablet by mouth daily. 04/19/18  Yes Shaune Pollack, MD  mycophenolate (CELLCEPT) 500 MG tablet Take 1 tablet (500 mg total) by mouth 2 (two) times daily. Take 1 tablet (500MG ) by mouth every morning and 2 tablets (1000MG ) by mouth every evening Patient taking differently:  Take 500-1,000 mg by mouth See admin instructions. Take 1 tablet (500MG ) by mouth every morning and 2 tablets (1000MG ) by mouth every evening  05/30/18  Yes Pucilowska, Jolanta B, MD  B Complex-C (B-COMPLEX WITH VITAMIN C) tablet Take 1 tablet by mouth daily. 11/02/17   Alford Highland, MD  senna-docusate (SENOKOT-S) 8.6-50 MG tablet Take 1 tablet by mouth at bedtime as needed for mild constipation. Patient taking differently: Take 1 tablet by mouth daily.  12/25/17   Mayo, Allyn Kenner, MD  testosterone cypionate (DEPOTESTOSTERONE CYPIONATE) 200 MG/ML injection Inject 200 mg into the muscle every 14 (fourteen) days.    [provider]  thiamine 100 MG tablet Take 1 tablet (100 mg total) by mouth daily. 06/29/18   Altamese Dilling, MD  traZODone (DESYREL) 100 MG tablet Take 1 tablet (100 mg total) by mouth at bedtime. 05/30/18   Pucilowska, Ellin Goodie, MD    Allergies Nsaids and Other  Family History  Problem Relation Age of Onset  . Rheum arthritis Father     Social History Social History   Tobacco Use  . Smoking status: Current Every Day Smoker    Packs/day: 0.50    Types: Cigarettes  . Smokeless tobacco: Never Used  Substance Use Topics  . Alcohol use: Yes    Alcohol/week: 20.0 - 30.0 standard drinks    Types: 20 - 30 Shots of liquor per week    Comment: Last drink 3 days ago 06/22/2018- rum  . Drug use: Yes    Types: Oxycodone    Review of Systems  Constitutional: No fever/chills Eyes: No visual changes. ENT: No sore throat. Cardiovascular: Denies chest pain. Respiratory: Denies shortness of breath. Gastrointestinal: No abdominal pain.  No nausea, no vomiting.  No diarrhea.  No constipation. Genitourinary: Negative for dysuria. Musculoskeletal: Negative for back pain. Skin: Negative for rash. Neurological: Negative for headaches, focal weakness ____________________________________________   PHYSICAL EXAM:  VITAL SIGNS: ED Triage Vitals  Enc Vitals Group      BP      Pulse      Resp      Temp      Temp src      SpO2      Weight      Height      Head Circumference      Peak Flow      Pain Score      Pain Loc      Pain Edu?      Excl. in GC?     Constitutional: Alert and oriented.  Looks shaky and a little worried Eyes: Conjunctivae are somewhat injected Head: Atraumatic. Nose: No congestion/rhinnorhea. Mouth/Throat: Mucous membranes are moist.  Oropharynx non-erythematous. Neck: No stridor. Cardiovascular: Normal rate, regular rhythm. Grossly normal heart sounds.  Good peripheral circulation. Respiratory: Normal respiratory effort.  No retractions. Lungs CTAB. Gastrointestinal: Soft and nontender. No distention. No abdominal bruits. No CVA tenderness. Musculoskeletal: No lower extremity tenderness  Neurologic:  Normal speech and language. No gross focal neurologic deficits are appreciated.  Patient is tremulous Skin:  Skin is warm, dry and intact. No rash noted.  ____________________________________________   LABS (all labs ordered are listed, but only abnormal results are displayed)  Labs Reviewed  COMPREHENSIVE METABOLIC PANEL - Abnormal; Notable for the following components:      Result Value   Potassium 3.4 (*)    Glucose, Bld 141 (*)    Calcium 8.4 (*)    All other components within normal limits  CBC WITH DIFFERENTIAL/PLATELET - Abnormal; Notable for the following components:   WBC 3.5 (*)    Hemoglobin 12.4 (*)    HCT 38.2 (*)    RDW 19.5 (*)    Neutro Abs 1.2 (*)    All other components within normal limits  LACTIC ACID, PLASMA  LACTIC ACID, PLASMA  ETHANOL   ____________________________________________  EKG  EKG read and interpreted by me shows sinus tach at a rate of 109 left axis no acute ST-T wave changes ____________________________________________  RADIOLOGY  ED MD interpretation:   Official radiology report(s): No results found.  ____________________________________________    PROCEDURES  Procedure(s) performed (including Critical Care):  Procedures   ____________________________________________   INITIAL IMPRESSION / ASSESSMENT AND PLAN / ED COURSE     Patient initially says he had not had anything to drink for 2 days now he says he had some beer this morning.  Is able to stop his tremors for EKG.   Dr Scotty CourtStafford will watch til labs all back and likely discharge.       ____________________________________________   FINAL CLINICAL IMPRESSION(S) / ED DIAGNOSES  Final diagnoses:  Alcohol withdrawal syndrome without complication [facility](HCC)     ED Discharge Orders    None       Note:  This document was prepared using Dragon voice recognition software and may include unintentional dictation errors.    Arnaldo NatalMalinda, Paul F, MD 08/26/18 0700

## 2018-08-26 NOTE — ED Notes (Signed)
Pt placed on oxygen at 2lpm via [place] for pox of 92% on ra. Pt with rebound pox to 96% on oxgyen.

## 2018-08-26 NOTE — ED Notes (Signed)
Report to lorrie, rn.  

## 2018-08-26 NOTE — H&P (Signed)
[facility] - [place] at [facility]   PATIENT NAME: Kurt Horton    MR#:  [id number]  DATE OF BIRTH:  1960-12-23  DATE OF ADMISSION:  08/26/2018  PRIMARY CARE PHYSICIAN: Barbette ReichmannHande, Vishwanath, MD   REQUESTING/REFERRING PHYSICIAN: Dr. Scotty CourtStafford  CHIEF COMPLAINT:   Chief Complaint  Patient presents with  . Withdrawal    HISTORY OF PRESENT ILLNESS:  Kurt Horton  is a 58 y.o. male with a known history of alcohol abuse, recurrent admissions for alcohol withdrawal, hypertension, diabetes mellitus, atrial fibrillation who was recently in the hospital for alcohol withdrawals and was discharged home returns as patient ran out of alcohol at home and presently in withdrawals.  His lactic acid found to be 3.6.  Afebrile.  Normal WBC. Patient also tachycardic. His alcohol levels are 184.  He tells me on Sunday the liquor store was closed and all he could get was beer and could not drink enough and then ran out. Then he called EMS and arrived in the emergency room.  Here he received Librium, Ativan and continues to have tremors, withdrawals and is being admitted to the hospital.  PAST MEDICAL HISTORY:   Past Medical History:  Diagnosis Date  . A-fib (HCC)   . Alcohol abuse   . Alcohol withdrawal (HCC) 11/12/2016  . Diabetes mellitus without complication (HCC)   . DJD (degenerative joint disease)   . Hypertension   . Myasthenia gravis (HCC)   . Renal disorder     PAST SURGICAL HISTORY:   Past Surgical History:  Procedure Laterality Date  . AMPUTATION TOE Left 12/29/2015   Procedure: AMPUTATION TOE;  Surgeon: Gwyneth RevelsJustin Fowler, DPM;  Location: [facility] ORS;  Service: Podiatry;  Laterality: Left;  . ESOPHAGOGASTRODUODENOSCOPY N/A 02/05/2018   Procedure: ESOPHAGOGASTRODUODENOSCOPY (EGD);  Surgeon: Pasty Spillersahiliani, Varnita B, MD;  [facility];  Service: Endoscopy;  Laterality: N/A;  . PERIPHERAL VASCULAR CATHETERIZATION N/A 12/27/2015   Procedure: Lower Extremity Angiography;   Surgeon: Annice NeedyJason S Dew, MD;  Location: [facility] INVASIVE CV LAB;  Service: Cardiovascular;  Laterality: N/A;  . right hip surgery    . right shoulder surgery      SOCIAL HISTORY:   Social History   Tobacco Use  . Smoking status: Current Every Day Smoker    Packs/day: 0.50    Types: Cigarettes  . Smokeless tobacco: Never Used  Substance Use Topics  . Alcohol use: Yes    Alcohol/week: 20.0 - 30.0 standard drinks    Types: 20 - 30 Shots of liquor per week    Comment: Last drink 3 days ago 06/22/2018- rum    FAMILY HISTORY:   Family History  Problem Relation Age of Onset  . Rheum arthritis Father     DRUG ALLERGIES:   Allergies  Allergen Reactions  . Nsaids Other (See Comments)    Duodenal ulcers, GI bleeding  . Other Other (See Comments)    Patient states he can't take any "mycin" medications because myasthenia gravis.    REVIEW OF SYSTEMS:   Review of Systems  Constitutional: Positive for malaise/fatigue. Negative for chills and fever.  HENT: Negative for sore throat.   Eyes: Negative for blurred vision, double vision and pain.  Respiratory: Negative for cough, hemoptysis, shortness of breath and wheezing.   Cardiovascular: Negative for chest pain, palpitations, orthopnea and leg swelling.  Gastrointestinal: Positive for nausea. Negative for abdominal pain, constipation, diarrhea, heartburn and vomiting.  Genitourinary: Negative for dysuria and hematuria.  Musculoskeletal: Negative for back pain and joint pain.  Skin: Negative for rash.  Neurological: Positive for dizziness and tremors. Negative for sensory change, speech change, focal weakness and headaches.  Endo/Heme/Allergies: Does not bruise/bleed easily.  Psychiatric/Behavioral: Negative for depression. The patient is not nervous/anxious.     MEDICATIONS AT HOME:   Prior to Admission medications   Medication Sig Start Date End Date Taking? Authorizing Provider  B Complex-C (B-COMPLEX WITH VITAMIN C) tablet Take  1 tablet by mouth daily. Patient not taking: Reported on 08/26/2018 11/02/17   Alford Highland, MD  diltiazem (CARDIZEM CD) 120 MG 24 hr capsule Take 1 capsule (120 mg total) by mouth daily. Patient not taking: Reported on 08/26/2018 06/29/18   Altamese Dilling, MD  folic acid (FOLVITE) 1 MG tablet Take 1 tablet (1 mg total) by mouth daily. Patient not taking: Reported on 08/26/2018 06/29/18   Altamese Dilling, MD  gabapentin (NEURONTIN) 300 MG capsule Take 2 capsules (600 mg total) by mouth 3 (three) times daily. Patient not taking: Reported on 08/26/2018 05/30/18   Pucilowska, Ellin Goodie, MD  LORazepam (ATIVAN) 1 MG tablet Take 1 tablet (1 mg total) by mouth every 8 (eight) hours as needed for anxiety. Patient not taking: Reported on 08/26/2018 06/28/18   Altamese Dilling, MD  metFORMIN (GLUCOPHAGE) 500 MG tablet Take 1 tablet (500 mg total) by mouth daily. Patient not taking: Reported on 08/26/2018 05/30/18   Pucilowska, Braulio Conte B, MD  metoprolol succinate (TOPROL-XL) 50 MG 24 hr tablet Take 1 tablet (50 mg total) by mouth daily. Take with or immediately following a meal. Patient not taking: Reported on 08/26/2018 05/30/18   Pucilowska, Ellin Goodie, MD  multivitamin (ONE-A-DAY MEN'S) TABS tablet Take 1 tablet by mouth daily. Patient not taking: Reported on 08/26/2018 04/19/18   Shaune Pollack, MD  mycophenolate (CELLCEPT) 500 MG tablet Take 1 tablet (500 mg total) by mouth 2 (two) times daily. Take 1 tablet ( ) by mouth every morning and 2 tablets ( ) by mouth every evening Patient not taking: Reported on 08/26/2018 05/30/18   Pucilowska, Braulio Conte B, MD  senna-docusate (SENOKOT-S) 8.6-50 MG tablet Take 1 tablet by mouth at bedtime as needed for mild constipation. Patient not taking: Reported on 08/26/2018 12/25/17   MayoAllyn Kenner, MD  testosterone cypionate (DEPOTESTOSTERONE CYPIONATE) 200 MG/ML injection Inject 200 mg into the muscle every 14 (fourteen) days.    [provider]   thiamine 100 MG tablet Take 1 tablet (100 mg total) by mouth daily. Patient not taking: Reported on 08/26/2018 06/29/18   Altamese Dilling, MD  traZODone (DESYREL) 100 MG tablet Take 1 tablet (100 mg total) by mouth at bedtime. Patient not taking: Reported on 08/26/2018 05/30/18   Pucilowska, Ellin Goodie, MD     VITAL SIGNS:  Blood pressure (!) 148/96, pulse (!) 101, temperature 98.7 F (37.1 C), temperature source Oral, resp. rate 15, height  (1.803 m), weight 80.5 kg, SpO2 97 %.  PHYSICAL EXAMINATION:  Physical Exam  GENERAL:  59 y.o.-year-old patient lying in the bed with no acute distress.  EYES: Pupils equal, round, reactive to light and accommodation. No scleral icterus. Extraocular muscles intact.  HEENT: Head atraumatic, normocephalic. Oropharynx and nasopharynx clear. No oropharyngeal erythema, moist oral mucosa  NECK:  Supple, no jugular venous distention. No thyroid enlargement, no tenderness.  LUNGS: Normal breath sounds bilaterally, no wheezing, rales, rhonchi. No use of accessory muscles of respiration.  CARDIOVASCULAR: S1, S2, tachycardia ABDOMEN: Soft, nontender, nondistended. Bowel sounds present. No organomegaly or mass.  EXTREMITIES: No pedal edema, cyanosis, or clubbing. +  2 pedal & radial pulses b/l.   NEUROLOGIC: Cranial nerves II through XII are intact. No focal Motor or sensory deficits appreciated b/l PSYCHIATRIC: The patient is alert and oriented x 3.  Anxious SKIN: No obvious rash, lesion, or ulcer.   LABORATORY PANEL:   CBC Recent Labs  Lab 08/26/18 0616  WBC 3.5*  HGB 12.4*  HCT 38.2*  PLT 290   ------------------------------------------------------------------------------------------------------------------  Chemistries  Recent Labs  Lab 08/20/18 0412 08/26/18 0616  NA 137 141  K 3.9 3.4*  CL 100 102  CO2 26 26  GLUCOSE 111* 141*  BUN 12 6  CREATININE 0.71 0.88  CALCIUM 8.8* 8.4*  MG 1.4*  --   AST  --  39  ALT  --  29   ALKPHOS  --  86  BILITOT  --  0.5   ------------------------------------------------------------------------------------------------------------------  Cardiac Enzymes No results for input(s): TROPONINI in the last 168 hours. ------------------------------------------------------------------------------------------------------------------  RADIOLOGY:  No results found.   IMPRESSION AND PLAN:   *Lactic acidosis.  Bolus 1 L normal saline STAT.  Will repeat lactic acid.  No signs of infection.  Does have significant tachycardia from alcohol withdrawals which is likely the cause.  Afebrile.  Normal WBC.  Will start IV antibiotics if any febrile episodes.  *Alcohol withdrawal Patient received oral Librium, Ativan and continues to have tremors and withdrawals.  Patient will be admitted with CIWA protocol.  Thiamine, folic acid.  IV fluids.  Scheduled Librium added.  *Hypertension.  Restart Cardizem and metoprolol at home.  He has not been compliant with medications.  *Chronic diarrhea.  No abdominal pain or fever.  Normal WBC.  *Myasthenia gravis.  Continue CellCept from home.  *Diabetes mellitus.  Sliding scale insulin.  Recent hemoglobin A1c was 5.9.  *Paroxysmal atrial fibrillation.  Presently he is in sinus tachycardia.  Stat doses of Cardizem and metoprolol ordered.  High risk for A. fib with RVR like his last admission.  DVT prophylaxis with Lovenox  All the records are reviewed and case discussed with ED provider. Management plans discussed with the patient, family and they are in agreement.  CODE STATUS: Full code  TOTAL CRITICAL CARE TIME TAKING CARE OF THIS PATIENT: 35 minutes.   Molinda Bailiff Kurt Horton M.D on 08/26/2018 at 10:57 AM  Between 7am to 6pm - Pager - [phone]  After 6pm go to [email or web address] - password [email or web address] [facility]  [facility] [place] [facility]  Office  [phone]  CC: Primary care physician; [name], MD  Note: This dictation was prepared with  Dragon dictation along with smaller phrase technology. Any transcriptional errors that result from this process are unintentional.

## 2018-08-26 NOTE — ED Notes (Signed)
Date and time results received: 08/26/18  Test: lactic Critical Value: 3.7  Name of Provider Notified: stafford  Orders Received? Or Actions Taken?: MD notified

## 2018-08-26 NOTE — ED Provider Notes (Signed)
-----------------------------------------   10:00 AM on 08/26/2018 -----------------------------------------   Lactate resulted at 3.6.  Patient was given an additional liter bolus at that time.  Still receiving the banana bag ordered by Dr. Juliette Alcide as well.  Despite these fluids, he is becoming more tachycardic, now to 115.  Blood pressure also is slowly becoming more elevated as well.  I am concerned for autonomic instability in the setting of alcohol withdrawal.  Patient does confirm that he normally drinks at least 20 airplane bottles of liquor daily and ran out yesterday and has been drinking much less.  Denies any hallucinosis or formication's currently, but does not currently have access to alcohol at home.  Due to his escalating withdrawal symptoms despite previous Ativan and Librium, I will give him additional IV Ativan and plan to hospitalize for further stabilization.  Counseled the patient on the need to develop an alcohol cessation strategy once his withdrawal symptoms have passed, as he is likely to have frequent struggles with alcohol withdrawal which can be life-threatening in the future if he resumes drinking.   Sharman Cheek, MD 08/26/18 1003

## 2018-08-26 NOTE — Progress Notes (Signed)
PT Cancellation Note  Patient Details Name: Kurt Horton MRN: [id number] DOB: Jan 03, 1961   Cancelled Treatment:    Reason Eval/Treat Not Completed: Other (comment)(Chart reviewed, pt just recently (hours ago) admitted with ETOH withdrawl, CIWA scale not completed yet, but from review of BP and HR, pt is not appropriate at this time for OOB assessment. Pt is familiar to author from prior admission. Will continue to follow remotely and evaluate once patient is medically appropriate.)   1:24 PM, 08/26/18 Rosamaria Lints, PT, DPT Physical Therapist - [facility]  [phone] (ASCOM)      Ariv Penrod C 08/26/2018, 1:23 PM

## 2018-08-26 NOTE — ED Notes (Signed)
Patient has been provided Meal and warm blankets.

## 2018-08-26 NOTE — ED Triage Notes (Signed)
Pt arrived via EMS from his parents house; here for withdrawal from alcohol; says he's shaking "real bad inside"; pt is normally a daily drinker but says he hasn't had any alcohol in 2 days; c/o chronic low back pain;

## 2018-08-27 LAB — CBC
HCT: 38.5 % — ABNORMAL LOW (ref 39.0–52.0)
Hemoglobin: 12.4 g/dL — ABNORMAL LOW (ref 13.0–17.0)
MCH: 29.4 pg (ref 26.0–34.0)
MCHC: 32.2 g/dL (ref 30.0–36.0)
MCV: 91.2 fL (ref 80.0–100.0)
Platelets: 253 10*3/uL (ref 150–400)
RBC: 4.22 MIL/uL (ref 4.22–5.81)
RDW: 18.9 % — ABNORMAL HIGH (ref 11.5–15.5)
WBC: 5.2 10*3/uL (ref 4.0–10.5)
nRBC: 0 % (ref 0.0–0.2)

## 2018-08-27 LAB — COMPREHENSIVE METABOLIC PANEL
ALT: 25 U/L (ref 0–44)
AST: 31 U/L (ref 15–41)
Albumin: 3.3 g/dL — ABNORMAL LOW (ref 3.5–5.0)
Alkaline Phosphatase: 86 U/L (ref 38–126)
Anion gap: 8 (ref 5–15)
BUN: 6 mg/dL (ref 6–20)
CO2: 25 mmol/L (ref 22–32)
Calcium: 8.9 mg/dL (ref 8.9–10.3)
Chloride: 104 mmol/L (ref 98–111)
Creatinine, Ser: 0.66 mg/dL (ref 0.61–1.24)
GFR calc Af Amer: >60 mL/min (ref >60)
GFR calc non Af Amer: >60 mL/min (ref >60)
Glucose, Bld: 125 mg/dL — ABNORMAL HIGH (ref 70–99)
Potassium: 3.9 mmol/L (ref 3.5–5.1)
Sodium: 137 mmol/L (ref 135–145)
Total Bilirubin: 0.7 mg/dL (ref 0.3–1.2)
Total Protein: 6.3 g/dL — ABNORMAL LOW (ref 6.5–8.1)

## 2018-08-27 LAB — GLUCOSE, CAPILLARY
Glucose-Capillary: 140 mg/dL — ABNORMAL HIGH (ref 70–99)
Glucose-Capillary: 126 mg/dL — ABNORMAL HIGH (ref 70–99)
Glucose-Capillary: 145 mg/dL — ABNORMAL HIGH (ref 70–99)
Glucose-Capillary: 111 mg/dL — ABNORMAL HIGH (ref 70–99)

## 2018-08-27 LAB — PROTIME-INR
INR: 0.8 (ref 0.8–1.2)
Prothrombin Time: 11 seconds — ABNORMAL LOW (ref 11.4–15.2)

## 2018-08-27 MED ORDER — LORAZEPAM 2 MG PO TABS
2.0000 mg | ORAL_TABLET | Freq: Four times a day (QID) | ORAL | Status: DC | PRN
  Administered 2018-08-27 – 2018-08-28 (×4): 2 mg via ORAL
  Filled 2018-08-27 (×4): qty 1

## 2018-08-27 NOTE — TOC Initial Note (Signed)
Transition of Care [facility]) - Initial/Assessment Note    Patient Details  Name: Kurt Horton MRN: [id number] Date of Birth: 1961/04/08  Transition of Care [facility]) CM/SW Contact:    Annamaria Boots, [place] Phone Number: 08/27/2018, 3:58 PM  Clinical Narrative:  CSW consulted for ETOH abuse. This patient is well known to CSW from multiple previous admissions for detox. CSW met with patient to discuss discharge planning. Patient currently resides with his elderly parents. Patient states that his medicaid is pending but on previous admission it was found that patient does not qualify for Medicaid. Patient reports that he came in for detox and wants to "get clean". CSW reminded him of previous admissions where treatment centers were set up for him to go and patient refused or never showed up. Patient reports that he is still going through withdrawals and does not want to discuss any further at this time. CSW provided a list of drug/alcohol resources for patient. CSW will monitor for any additional needs.                   Expected Discharge Plan: [place] Barriers to Discharge: Continued Medical Work up   Patient Goals and CMS Choice Patient states their goals for this hospitalization and ongoing recovery are:: I want to get better  CMS Medicare.gov Compare Post Acute Care list provided to:: Patient    Expected Discharge Plan and Services Expected Discharge Plan: [place]       Living arrangements for the past 2 months: Single Family Home                          Prior Living Arrangements/Services Living arrangements for the past 2 months: Single Family Home Lives with:: Parents Patient language and need for interpreter reviewed:: Yes Do you feel safe going back to the place where you live?: Yes      Need for Family Participation in Patient Care: Yes (Comment) Care giver support system in place?: Yes (comment)   Criminal Activity/Legal  Involvement Pertinent to Current Situation/Hospitalization: No - Comment as needed  Activities of Daily Living Home Assistive Devices/Equipment: Cane (specify quad or straight), Walker (specify type), CPAP ADL Screening (condition at time of admission) Patient's cognitive ability adequate to safely complete daily activities?: Yes Is the patient deaf or have difficulty hearing?: No Does the patient have difficulty seeing, even when wearing glasses/contacts?: No Does the patient have difficulty concentrating, remembering, or making decisions?: No Patient able to express need for assistance with ADLs?: Yes Does the patient have difficulty dressing or bathing?: No Independently performs ADLs?: Yes (appropriate for developmental age) Does the patient have difficulty walking or climbing stairs?: No Weakness of Legs: None Weakness of Arms/Hands: None  Permission Sought/Granted Permission sought to share information with : Case Manager, Customer service manager, Family Supports Permission granted to share information with : Yes, Verbal Permission Granted              Emotional Assessment Appearance:: Appears older than stated age Attitude/Demeanor/Rapport: Guarded Affect (typically observed): Quiet Orientation: : Oriented to Self, Oriented to Place, Oriented to  Time Alcohol / Substance Use: Not Applicable Psych Involvement: No (comment)  Admission diagnosis:  Alcohol withdrawal syndrome without complication ([place]) [P29.518] Patient Active Problem List   Diagnosis Date Noted  . Alcohol withdrawal ([place]) 08/26/2018  . A-fib ([place]) 08/17/2018  . Tobacco use disorder 05/27/2018  . GI bleed 02/04/2018  .  Alcohol intoxication ([place]) 12/22/2017  . Malnutrition of moderate degree 06/06/2017  . Chronic combined systolic and diastolic CHF (congestive heart failure) ([place]) 08/13/2017  . Alcohol withdrawal delirium ([place]) 02/26/2017  . Atrial fibrillation with RVR ([place]) 02/25/2017  . Acute  alcoholic intoxication with complication ([place])   . Alcohol use disorder, severe, dependence ([place])   . Substance induced mood disorder ([place]) 11/13/2016  . SVT (supraventricular tachycardia) ([place]) 11/12/2016  . HTN (hypertension) 11/12/2016  . EtOH dependence ([place]) 11/12/2016  . Lumbar radiculopathy 08/24/2016  . Major depressive disorder, recurrent severe without psychotic features ([place]) 08/24/2016  . Chronic low back pain 01/04/2016  . Pressure ulcer 12/29/2015  . Osteomyelitis ([place]) 12/26/2015  . PVD (peripheral vascular disease) ([place]) 12/26/2015  . Type II diabetes mellitus with manifestations ([place]) 12/26/2015  . Myasthenia gravis ([place]) 12/26/2015   PCP:  Tracie Harrier, MD Pharmacy:   [place], [address] [place] [address] Phone: [phone] Fax: [phone]  [facility] [place], [address] [place] [address] Phone: [phone] Fax: [phone]  [facility] [place], [address] [place] [address] Phone: [phone] Fax: [phone]     Social Determinants of Health (SDOH) Interventions    Readmission Risk Interventions Readmission Risk Prevention Plan 08/19/2018  Transportation Screening Complete  Medication Review ([place]) Complete  PCP or Specialist appointment within 3-5 days of discharge Complete  HRI or [place] Complete  SW Recovery Care/Counseling Consult Complete  [place] Not Applicable  Some recent data might be hidden

## 2018-08-27 NOTE — Progress Notes (Signed)
[facility] -  at [facility]   PATIENT NAME: Kurt Horton    MR#:  [id number]  DATE OF BIRTH:  1960-09-26  SUBJECTIVE:  CHIEF COMPLAINT:   Chief Complaint  Patient presents with  . Withdrawal   Requesting to increase Ativan dose and oxycodone.  Once IV and not oral. He is scoring low on CIWA compared to yesterday.  REVIEW OF SYSTEMS:    Review of Systems  Constitutional: Positive for malaise/fatigue. Negative for chills and fever.  HENT: Negative for sore throat.   Eyes: Negative for blurred vision, double vision and pain.  Respiratory: Negative for cough, hemoptysis, shortness of breath and wheezing.   Cardiovascular: Negative for chest pain, palpitations, orthopnea and leg swelling.  Gastrointestinal: Negative for abdominal pain, constipation, diarrhea, heartburn, nausea and vomiting.  Genitourinary: Negative for dysuria and hematuria.  Musculoskeletal: Positive for back pain (chronic). Negative for joint pain.  Skin: Negative for rash.  Neurological: Positive for tremors (chronic). Negative for sensory change, speech change, focal weakness and headaches.  Endo/Heme/Allergies: Does not bruise/bleed easily.  Psychiatric/Behavioral: Negative for depression. The patient is not nervous/anxious.     DRUG ALLERGIES:   Allergies  Allergen Reactions  . Nsaids Other (See Comments)    Duodenal ulcers, GI bleeding  . Other Other (See Comments)    Patient states he can't take any "mycin" medications because myasthenia gravis.    VITALS:  Blood pressure (!) 152/101, pulse 89, temperature 97.6 F (36.4 C), temperature source Oral, resp. rate 20, height 5\' 11"  (1.803 m), weight 85 kg, SpO2 98 %.  PHYSICAL EXAMINATION:   Physical Exam  GENERAL:  58 y.o.-year-old patient lying in the bed with no acute distress.  EYES: Pupils equal, round, reactive to light and accommodation. No scleral icterus. Extraocular muscles intact.  HEENT: Head atraumatic,  normocephalic. Oropharynx and nasopharynx clear.  NECK:  Supple, no jugular venous distention. No thyroid enlargement, no tenderness.  LUNGS: Normal breath sounds bilaterally, no wheezing, rales, rhonchi. No use of accessory muscles of respiration.  CARDIOVASCULAR: S1, S2 normal. No murmurs, rubs, or gallops.  ABDOMEN: Soft, nontender, nondistended. Bowel sounds present. No organomegaly or mass.  EXTREMITIES: No cyanosis, clubbing or edema b/l.    NEUROLOGIC: Cranial nerves II through XII are intact. No focal Motor or sensory deficits b/l.   PSYCHIATRIC: The patient is alert and oriented x 3.  SKIN: No obvious rash, lesion, or ulcer.   LABORATORY PANEL:   CBC Recent Labs  Lab 08/27/18 0500  WBC 5.2  HGB 12.4*  HCT 38.5*  PLT 253   ------------------------------------------------------------------------------------------------------------------ Chemistries  Recent Labs  Lab 08/27/18 0500  NA 137  K 3.9  CL 104  CO2 25  GLUCOSE 125*  BUN 6  CREATININE 0.66  CALCIUM 8.9  AST 31  ALT 25  ALKPHOS 86  BILITOT 0.7   ------------------------------------------------------------------------------------------------------------------  Cardiac Enzymes No results for input(s): TROPONINI in the last 168 hours. ------------------------------------------------------------------------------------------------------------------  RADIOLOGY:  No results found.   ASSESSMENT AND PLAN:   *Alcohol withdrawal Proving.  We will continue Librium and as needed Ativan. Patient has chronic tremors  I have treated this patient multiple times in the past and he requests increasing doses of Ativan and oxycodone.  He likely should be ready to be discharged tomorrow.  *Lactic acidosis.  Improved with IV fluids.  No signs of infection.  Likely secondary to significant tachycardia and alcohol.  *Hypertension.   On Cardizem and metoprolol.  Improved.  *Chronic diarrhea.  No abdominal  pain or  fever.  Normal WBC.  *Myasthenia gravis.  Continue CellCept from home.  *Diabetes mellitus.  Sliding scale insulin.  Recent hemoglobin A1c was 5.9.  *Paroxysmal atrial fibrillation.  In normal sinus rhythm.  On Cardizem and metoprolol. Not on anticoagulation due to noncompliance and falls.  DVT prophylaxis with Lovenox  All the records are reviewed and case discussed with Care Management/Social Worker Management plans discussed with the patient, family and they are in agreement.  CODE STATUS: FULL CODE  DVT Prophylaxis: SCDs  TOTAL TIME TAKING CARE OF THIS PATIENT: 40 minutes.   POSSIBLE D/C IN 1-2 DAYS, DEPENDING ON CLINICAL CONDITION.  Molinda BailiffSrikar R Ericia Moxley M.D on 08/27/2018 at 12:43 PM  Between 7am to 6pm - Pager - [phone]  After 6pm go to [email or web address] - password [email or web address] [facility]  SOUND [place] [facility]  Office  [phone]  CC: Primary care physician; Barbette ReichmannHande, Vishwanath, MD  Note: This dictation was prepared with Dragon dictation along with smaller phrase technology. Any transcriptional errors that result from this process are unintentional.

## 2018-08-27 NOTE — Evaluation (Signed)
Physical Therapy Evaluation Patient Details Name: Kurt Horton MRN: [id number] DOB: 1960/10/09 Today's Date: 08/27/2018   History of Present Illness  58 y/o M with multiple recent admits with Afib, ETOH withdrawl. Pt has had frequent admissions at this facility for alcohol detox in the past year (>10). PMH includes chronic pain, myasthenia gravis, a-fib, hip replacement, lumbar DJD/DDD previously followed by chronic pain.   Clinical Impression  Pt with relatively typical PT assessment (has had multiple admits of withdrawal in the last few months).  He was anxious, had some weakness and tremors, but was able to ambulate in the hallway (>300 ft today) with walker and slow and cautious ambulation.  He fatigued with the effort, but HR never went >110 bpm during the effort and ultimately he again appears to be near his baseline - which is not good for his age but is adequate to manage at home with assist from family.  Continued ETOH abuse, however seems to be biggest issue regarding pt's prognosis.    Follow Up Recommendations Home health PT;Supervision/Assistance - 24 hour;Supervision for mobility/OOB    Equipment Recommendations  None recommended by PT    Recommendations for Other Services       Precautions / Restrictions Precautions Precautions: Fall Restrictions Weight Bearing Restrictions: No      Mobility  Bed Mobility Overal bed mobility: Modified Independent             General bed mobility comments: Pt anxious about doing much, but ultimately able to get to EOB w/o assist  Transfers Overall transfer level: Modified independent Equipment used: Rolling walker (2 wheeled) Transfers: Sit to/from Stand Sit to Stand: Min guard         General transfer comment: Pt attempted to rise w/o appropriate use of UEs and was unable, cuing and assist with set up allowed pt to rise under his own power  Ambulation/Gait Ambulation/Gait assistance: Min guard Gait Distance  (Feet): 325 Feet Assistive device: Rolling walker (2 wheeled)       General Gait Details: Pt with very similar effort with ambulation as most recent previous efforts.  He continues to be reliant on the walker with some light veering L and regular cues to keep both of these in check, but ultimately he was able to exceed typical in-home distances with AD.    Stairs            Wheelchair Mobility    Modified Rankin (Stroke Patients Only)       Balance Overall balance assessment: Needs assistance Sitting-balance support: No upper extremity supported;Feet supported Sitting balance-Leahy Scale: Good Sitting balance - Comments: Pt able to maintain sitting balance w/o assist and no overt safety issues       Standing balance comment: Pt with unsteadiness that is his baseline (b/l LE neuropathy) along with withdrawl-like tremors that confound standing ambulation                             Pertinent Vitals/Pain Pain Assessment: (baseline c/o b/l LE neuropathic numbness/pain, head ache)    Home Living Family/patient expects to be discharged to:: (substance abuse rehab facility) Living Arrangements: Parent Available Help at Discharge: Family;Available 24 hours/day Type of Home: House Home Access: Level entry     Home Layout: One level Home Equipment: Walker - 2 wheels;Cane - single point      Prior Function Level of Independence: Independent with assistive device(s)  Comments: Uses AD most of the time.  Frequent falls when intoxicated, does report some falling when not intoxicated.  Pt ind with bathing, dressing.  Does not drive, says he uses [employer] when he leaves the house.  Pt and family order in and do not do much cooking.      Hand Dominance        Extremity/Trunk Assessment   Upper Extremity Assessment Upper Extremity Assessment: Generalized weakness(tremor t/o, functional for AD use)    Lower Extremity Assessment Lower Extremity  Assessment: Generalized weakness(grossly 4-/5 t/o)       Communication   Communication: No difficulties  Cognition Arousal/Alertness: Awake/alert Behavior During Therapy: Anxious Overall Cognitive Status: Within Functional Limits for tasks assessed                                        General Comments      Exercises General Exercises - Lower Extremity Ankle Circles/Pumps: AROM;10 reps Heel Slides: Strengthening;10 reps Hip ABduction/ADduction: Strengthening;10 reps Straight Leg Raises: AROM;10 reps   Assessment/Plan    PT Assessment Patient needs continued PT services  PT Problem List Decreased strength;Decreased range of motion;Decreased activity tolerance;Decreased balance;Decreased mobility;Decreased coordination;Decreased knowledge of use of DME;Decreased safety awareness;Pain       PT Treatment Interventions DME instruction;Gait training;Stair training;Therapeutic activities;Functional mobility training;Therapeutic exercise;Balance training;Neuromuscular re-education;Patient/family education    PT Goals (Current goals can be found in the Care Plan section)  Acute Rehab PT Goals Patient Stated Goal: Pt talks of working on his alcohol problem (similar to comments on previous admits) PT Goal Formulation: With patient Time For Goal Achievement: 09/10/18 Potential to Achieve Goals: Fair    Frequency Min 2X/week   Barriers to discharge        Co-evaluation               AM-PAC PT "6 Clicks" Mobility  Outcome Measure Help needed turning from your back to your side while in a flat bed without using bedrails?: None Help needed moving from lying on your back to sitting on the side of a flat bed without using bedrails?: None Help needed moving to and from a bed to a chair (including a wheelchair)?: A Little Help needed standing up from a chair using your arms (e.g., wheelchair or bedside chair)?: A Little Help needed to walk in hospital room?: A  Little Help needed climbing 3-5 steps with a railing? : A Lot 6 Click Score: 19    End of Session Equipment Utilized During Treatment: Gait belt Activity Tolerance: Patient tolerated treatment well;Patient limited by fatigue Patient left: with chair alarm set;with call bell/phone within reach Nurse Communication: Mobility status PT Visit Diagnosis: Muscle weakness (generalized) (M62.81);Repeated falls (R29.6);Unsteadiness on feet (R26.81);Other abnormalities of gait and mobility (R26.89);History of falling (Z91.81)    Time: [id number] PT Time Calculation (min) (ACUTE ONLY): 27 min   Charges:   PT Evaluation $PT Eval Low Complexity: 1 Low PT Treatments $Therapeutic Exercise: 8-22 mins        Malachi ProGalen R Derric Dealmeida, DPT 08/27/2018, 1:35 PM

## 2018-08-27 NOTE — Progress Notes (Signed)
   08/27/18 1500  Clinical Encounter Type  Visited With Patient not available;Health care provider  Visit Type Initial  Referral From Nurse   Chaplain received an OR to complete or update an AD. His nurse indicated that was not a good time for him as he is still experiencing withdrawal symptoms. Follow-up on a later date.

## 2018-08-28 LAB — GLUCOSE, CAPILLARY: Glucose-Capillary: 105 mg/dL — ABNORMAL HIGH (ref 70–99)

## 2018-08-28 MED ORDER — NICOTINE 14 MG/24HR TD PT24: 14.0000 mg | MEDICATED_PATCH | Freq: Every day | TRANSDERMAL | 0 refills | Status: DC

## 2018-08-28 MED ORDER — LORAZEPAM 1 MG PO TABS: 1.0000 mg | ORAL_TABLET | Freq: Three times a day (TID) | ORAL | 0 refills | Status: DC | PRN

## 2018-08-28 MED ORDER — THIAMINE HCL 100 MG PO TABS: 100.0000 mg | ORAL_TABLET | Freq: Every day | ORAL | 0 refills | Status: DC

## 2018-08-28 MED ORDER — FOLIC ACID 1 MG PO TABS: 1.0000 mg | ORAL_TABLET | Freq: Every day | ORAL | 0 refills | Status: AC

## 2018-08-28 NOTE — Plan of Care (Signed)

## 2018-08-28 NOTE — Discharge Summary (Signed)
[employer] -  at [facility]   PATIENT NAME: Kurt Horton    MR#:  [id number]  DATE OF BIRTH:  Sep 17, 1960  DATE OF ADMISSION:  08/26/2018 ADMITTING PHYSICIAN: Milagros Loll, MD  DATE OF DISCHARGE: 08/28/2018 11:18 AM  PRIMARY CARE PHYSICIAN: [name], MD   ADMISSION DIAGNOSIS:  Alcohol withdrawal syndrome without complication (HCC) [F10.230]  DISCHARGE DIAGNOSIS:  Active Problems:   Alcohol withdrawal (HCC) Lactic acidosis Chronic diarrhea Myasthenia gravis Type 2 diabetes mellitus Proximal atrial fibrillation  SECONDARY DIAGNOSIS:   Past Medical History:  Diagnosis Date  . A-fib (HCC)   . Alcohol abuse   . Alcohol withdrawal (HCC) 11/12/2016  . Diabetes mellitus without complication (HCC)   . DJD (degenerative joint disease)   . Hypertension   . Myasthenia gravis (HCC)   . Renal disorder      ADMITTING HISTORY Kurt Horton  is a 58 y.o. male with a known history of alcohol abuse, recurrent admissions for alcohol withdrawal, hypertension, diabetes mellitus, atrial fibrillation who was recently in the hospital for alcohol withdrawals and was discharged home returns as patient ran out of alcohol at home and presently in withdrawals.  His lactic acid found to be 3.6.  Afebrile.  Normal WBC.Patient also tachycardic.His alcohol levels are 184.  He tells me on Sunday the liquor store was closed and all he could get was beer and could not drink enough and then ran out.Then he called EMS and arrived in the emergency room.  Here he received Librium, Ativan and continues to have tremors, withdrawals and is being admitted to the hospital.   HOSPITAL COURSE:  Patient was admitted to medical floor.  Was started on Librium and Ativan for alcohol withdrawal.  Patient was put on CIWA protocol.  Thiamine and folic acid were supplemented.  She received a normal saline bolus and fluids for lactic acidosis.  His Actiq acidosis improved.  Patient continued  CellCept for myasthenia gravis.  He received physical therapy for ambulatory dysfunction and endurance training.  No evidence of any infection.  Patient will be discharged home with home physical therapy and home health services.  CONSULTS OBTAINED:    DRUG ALLERGIES:   Allergies  Allergen Reactions  . Nsaids Other (See Comments)    Duodenal ulcers, GI bleeding  . Other Other (See Comments)    Patient states he can't take any "mycin" medications because myasthenia gravis.    DISCHARGE MEDICATIONS:   Allergies as of 08/28/2018      Reactions   Nsaids Other (See Comments)   Duodenal ulcers, GI bleeding   Other Other (See Comments)   Patient states he can't take any "mycin" medications because myasthenia gravis.      Medication List    STOP taking these medications   B-complex with vitamin C tablet   gabapentin 300 MG capsule Commonly known as:  NEURONTIN   multivitamin Tabs tablet   mycophenolate 500 MG tablet Commonly known as:  CELLCEPT     TAKE these medications   diltiazem 120 MG 24 hr capsule Commonly known as:  CARDIZEM CD Take 1 capsule (120 mg total) by mouth daily.   folic acid 1 MG tablet Commonly known as:  FOLVITE Take 1 tablet (1 mg total) by mouth daily. What changed:  Another medication with the same name was added. Make sure you understand how and when to take each.   folic acid 1 MG tablet Commonly known as:  FOLVITE Take 1 tablet (1 mg total) by  mouth daily for 30 days. Start taking on:  August 29, 2018 What changed:  You were already taking a medication with the same name, and this prescription was added. Make sure you understand how and when to take each.   LORazepam 1 MG tablet Commonly known as:  Ativan Take 1 tablet (1 mg total) by mouth every 8 (eight) hours as needed for up to 5 days for anxiety.   metFORMIN 500 MG tablet Commonly known as:  GLUCOPHAGE Take 1 tablet (500 mg total) by mouth daily.   metoprolol succinate 50 MG 24 hr  tablet Commonly known as:  TOPROL-XL Take 1 tablet (50 mg total) by mouth daily. Take with or immediately following a meal.   nicotine 14 mg/24hr patch Commonly known as:  NICODERM CQ - dosed in mg/24 hours Place 1 patch (14 mg total) onto the skin daily. Start taking on:  August 29, 2018   senna-docusate 8.6-50 MG tablet Commonly known as:  Senokot-S Take 1 tablet by mouth at bedtime as needed for mild constipation.   testosterone cypionate 200 MG/ML injection Commonly known as:  DEPOTESTOSTERONE CYPIONATE Inject 200 mg into the muscle every 14 (fourteen) days.   thiamine 100 MG tablet Take 1 tablet (100 mg total) by mouth daily for 30 days. Start taking on:  August 29, 2018   traZODone 100 MG tablet Commonly known as:  DESYREL Take 1 tablet (100 mg total) by mouth at bedtime.       Today  Patient seen today Tolerating diet well No chest pain No shortness of breath Hemodynamically stable VITAL SIGNS:  Blood pressure (!) 145/83, pulse 99, temperature (!) 97.4 F (36.3 C), temperature source Oral, resp. rate 20, height 5\' 11"  (1.803 m), weight 93 kg, SpO2 97 %.  I/O:    Intake/Output Summary (Last 24 hours) at 08/28/2018 1147 Last data filed at 08/28/2018 1008 Gross per 24 hour  Intake 720 ml  Output 1630 ml  Net -910 ml    PHYSICAL EXAMINATION:  Physical Exam  GENERAL:  58 y.o.-year-old patient lying in the bed with no acute distress.  LUNGS: Normal breath sounds bilaterally, no wheezing, rales,rhonchi or crepitation. No use of accessory muscles of respiration.  CARDIOVASCULAR: S1, S2 normal. No murmurs, rubs, or gallops.  ABDOMEN: Soft, non-tender, non-distended. Bowel sounds present. No organomegaly or mass.  NEUROLOGIC: Moves all 4 extremities. PSYCHIATRIC: The patient is alert and oriented x 3.  SKIN: No obvious rash, lesion, or ulcer.   DATA REVIEW:   CBC Recent Labs  Lab 08/27/18 0500  WBC 5.2  HGB 12.4*  HCT 38.5*  PLT 253    Chemistries   Recent Labs  Lab 08/27/18 0500  NA 137  K 3.9  CL 104  CO2 25  GLUCOSE 125*  BUN 6  CREATININE 0.66  CALCIUM 8.9  AST 31  ALT 25  ALKPHOS 86  BILITOT 0.7    Cardiac Enzymes No results for input(s): TROPONINI in the last 168 hours.  Microbiology Results  Results for orders placed or performed during the hospital encounter of 04/15/18  MRSA PCR Screening     Status: None   Collection Time: 04/15/18 11:53 AM  Result Value Ref Range Status   MRSA by PCR NEGATIVE NEGATIVE Final    Comment:        The GeneXpert MRSA Assay (FDA approved for NASAL specimens only), is one component of a comprehensive MRSA colonization surveillance program. It is not intended to diagnose MRSA infection nor to  guide or monitor treatment for MRSA infections. Performed at [facility], [address], [place], [place] [address]     RADIOLOGY:  No results found.  Follow up with PCP in 1 week.  Management plans discussed with the patient, family and they are in agreement.  CODE STATUS: Full code    Code Status Orders  (From admission, onward)         Start     Ordered   08/26/18 1055  Full code  Continuous     08/26/18 1055        Code Status History    Date Active Date Inactive Code Status Order ID Comments User Context   08/26/2018 0613 08/26/2018 1055 Full Code [id number]  Arnaldo Natal, MD ED   08/17/2018 1924 08/20/2018 1838 Full Code [id number]  Milagros Loll, MD ED   06/24/2018 1438 06/28/2018 2141 Full Code [id number]  Altamese Dilling, MD ED   06/24/2018 1207 06/24/2018 1438 Full Code [id number]  Arnaldo Natal, MD ED   06/12/2018 0350 06/13/2018 0101 Full Code [id number]  Darci Current, MD ED   05/25/2018 1609 05/30/2018 1913 Full Code [id number]  Luciano Cutter, DO Inpatient   05/17/2018 0121 05/21/2018 1635 Full Code [id number]  Mayo, Allyn Kenner, MD Inpatient   04/15/2018 1145 04/19/2018 2132 Full Code [id number]  Delfino Lovett, MD Inpatient   04/03/2018 0547  04/10/2018 2052 Full Code [id number]  Barbaraann Rondo, MD ED   02/04/2018 1528 02/07/2018 1728 Full Code [id number]  Mayo, Allyn Kenner, MD ED   02/03/2018 2233 02/04/2018 0841 Full Code [id number]  Jene Every, MD ED   01/20/2018 0140 01/22/2018 1819 Full Code [id number]  Barbaraann Rondo, MD ED   01/19/2018 1239 01/20/2018 0140 Full Code [id number]  Myrna Blazer, MD ED   12/22/2017 0841 12/25/2017 2031 Full Code [id number]  Barbaraann Rondo, MD Inpatient   10/27/2017 1522 11/02/2017 1432 Full Code [id number]  Milagros Loll, MD ED   10/21/2017 1945 10/23/2017 1833 Full Code [id number]  Auburn Bilberry, MD Inpatient   10/21/2017 1504 10/21/2017 1945 Full Code [id number]  Arnaldo Natal, MD ED   10/10/2017 1314 10/11/2017 1147 Full Code [id number]  Willy Eddy, MD ED   10/05/2017 2214 10/07/2017 2101 Full Code [id number]  Arnaldo Natal, MD ED   10/05/2017 1924 10/05/2017 2214 Full Code [id number]  Arnaldo Natal, MD ED   10/01/2017 0807 07-29-202019 1650 Full Code [id number]  Barbaraann Rondo, MD Inpatient   08/24/2017 1756 08/27/2017 1849 Full Code [id number]  Enid Baas, MD ED   08/14/2017 0030 08/18/2017 1605 Full Code [id number]  Oralia Manis, MD Inpatient   06/04/2017 0116 06/06/2017 1701 Full Code [id number]  Oralia Manis, MD Inpatient   03/19/2017 1559 03/20/2017 2128 Full Code [id number]  Arnaldo Natal, MD ED   02/25/2017 1421 03/01/2017 1829 Full Code [id number]  Houston Siren, MD Inpatient   11/12/2016 1533 11/14/2016 1425 Full Code [id number]  Marguarite Arbour, MD Inpatient   12/26/2015 1927 12/30/2015 1604 Full Code [id number]  Marguarite Arbour, MD Inpatient      TOTAL TIME TAKING CARE OF THIS PATIENT ON DAY OF DISCHARGE: more than 34 minutes.   Ihor Austin M.D on 08/28/2018 at 11:47 AM  Between 7am to 6pm - Pager - [phone]  After 6pm go to [email or web address] - password [email or web address] [facility]  [facility] [place] [facility]  Office  [phone]  CC: Primary care physician; [name],  MD  Note: This dictation was prepared with Dragon dictation along  with smaller phrase technology. Any transcriptional errors that result from this process are unintentional.

## 2018-08-28 NOTE — Progress Notes (Signed)
Pt for discharge home with hh. Alert/ no distress anxious to  Leave.  Instructions discussed with pt. Meds/ diet / activty/ and f/u given. Sl d/cd/ pt verbalized understanding of discharge plans.home via w/c with [employer].

## 2018-08-28 NOTE — Progress Notes (Signed)
Physical Therapy Treatment Patient Details Name: Kurt Horton MRN: [id number] DOB: April 26, 1961 Today's Date: 08/28/2018    History of Present Illness 58 y/o M with multiple recent admits with Afib, ETOH withdrawl. Pt has had frequent admissions at this facility for alcohol detox in the past year (>10). PMH includes chronic pain, myasthenia gravis, a-fib, hip replacement, lumbar DJD/DDD previously followed by chronic pain.     PT Comments    Patient tolerated treatment well is making progress towards goals. He was reluctant to participate today due to feeling weak and concerned about having enough energy for discharge later today. Patient was educated on importance of progressive overload for increasing strength and reassured he would be able to rest prior to discharge and he agreed to participate. He required assistance with dressing and finding his debit card at end of session. He continues to demonstrate tremors that improved with ambulation and seemed to correlate some with his level of anxiety. He remembered cuing from past visits to stand up taller when cued to keep RW close to body. He required min A - CGA for sit to stand and CGA for ambulation, showing similar deficits to previous visits with mild improvement. He demonstrated less veering. Patient would benefit from continued physical therapy to address remaining impairments and functional limitations to work towards stated goals and return to PLOF or maximal functional independence.     Follow Up Recommendations  Home health PT;Supervision/Assistance - 24 hour;Supervision for mobility/OOB     Equipment Recommendations  None recommended by PT    Recommendations for Other Services       Precautions / Restrictions Precautions Precautions: Fall Precaution Comments: continues to have balance difficulty Restrictions Weight Bearing Restrictions: No    Mobility  Bed Mobility               General bed mobility comments:  patient already seated at edge of bed.   Transfers Overall transfer level: Needs assistance Equipment used: Rolling walker (2 wheeled) Transfers: Sit to/from Stand Sit to Stand: Min guard;Min assist         General transfer comment: Patient unable to stand from low bed but able with CGA from slightly elevated surface with cuing for proper hand placement.   Ambulation/Gait Ambulation/Gait assistance: Min guard Gait Distance (Feet): 240 Feet Assistive device: Rolling walker (2 wheeled) Gait Pattern/deviations: Shuffle;Trunk flexed     General Gait Details: Patient ambulated approx 240 feet with RW and CGA. Patient allowed RW to get ahead of him and adopted stooped posture but was able to straighten up and bring it closer with cuing. No veering today.    Stairs             Wheelchair Mobility    Modified Rankin (Stroke Patients Only)       Balance Overall balance assessment: Needs assistance Sitting-balance support: No upper extremity supported;Feet supported Sitting balance-Leahy Scale: Good Sitting balance - Comments: Pt able to maintain sitting balance w/o assist and no overt safety issues   Standing balance support: During functional activity;Bilateral upper extremity supported Standing balance-Leahy Scale: Fair Standing balance comment: Pt with unsteadiness that is his baseline (b/l LE neuropathy) along with withdrawl-like tremors that confound standing ambulation                            Cognition Arousal/Alertness: Awake/alert Behavior During Therapy: Anxious Overall Cognitive Status: Within Functional Limits for tasks assessed  Exercises Other Exercises Other Exercises: seated and standing balance practiced while donning pants and shirt with min A.     General Comments        Pertinent Vitals/Pain Pain Assessment: Faces Faces Pain Scale: Hurts a little bit Pain Location: chronic  back pain Pain Intervention(s): Monitored during session;Limited activity within patient's tolerance    Home Living                      Prior Function            PT Goals (current goals can now be found in the care plan section) Acute Rehab PT Goals Patient Stated Goal: Pt reports he really wants to work hard to get into better shape PT Goal Formulation: With patient Time For Goal Achievement: 09/01/18 Potential to Achieve Goals: Fair Progress towards PT goals: Progressing toward goals    Frequency    Min 2X/week      PT Plan Current plan remains appropriate    Co-evaluation              AM-PAC PT "6 Clicks" Mobility   Outcome Measure  Help needed turning from your back to your side while in a flat bed without using bedrails?: None Help needed moving from lying on your back to sitting on the side of a flat bed without using bedrails?: None Help needed moving to and from a bed to a chair (including a wheelchair)?: A Little Help needed standing up from a chair using your arms (e.g., wheelchair or bedside chair)?: A Little Help needed to walk in hospital room?: A Little Help needed climbing 3-5 steps with a railing? : A Lot 6 Click Score: 19    End of Session Equipment Utilized During Treatment: Gait belt Activity Tolerance: Patient tolerated treatment well;Patient limited by fatigue Patient left: with call bell/phone within reach;in bed;with bed alarm set Nurse Communication: Mobility status PT Visit Diagnosis: Muscle weakness (generalized) (M62.81);Repeated falls (R29.6);Unsteadiness on feet (R26.81);Other abnormalities of gait and mobility (R26.89);History of falling (Z91.81)     Time: 7505-1833 PT Time Calculation (min) (ACUTE ONLY): 15 min  Charges:  $Gait Training: 8-22 mins                     Luretha Murphy. Ilsa Iha, PT, DPT 08/28/18, 12:36 PM

## 2018-08-28 NOTE — TOC Transition Note (Signed)
Transition of Care [facility]) - CM/SW Discharge Note   Patient Details  Name: Kurt Horton MRN: [id number] Date of Birth: Jan 22, 1961  Transition of Care [facility]) CM/SW Contact:  Barrie Dunker, RN Phone Number: 08/28/2018, 10:09 AM   Clinical Narrative:    Patient to DC home, he states that he has a walker and a cane at home and has no needs for [facility] services, he has transportation with his mother and Benedetto Goad. He requested that the nurse hurry with his DC because he is ready to go home.  I let him know that the nurse has a process to follow to do the DC and she will get his completed as timely as possible.  He stated he does not need anything    Final next level of care: Home/Self Care Barriers to Discharge: Barriers Resolved   Patient Goals and CMS Choice Patient states their goals for this hospitalization and ongoing recovery are:: I want to get better  CMS Medicare.gov Compare Post Acute Care list provided to:: Patient    Discharge Placement                       Discharge Plan and Services                          Social Determinants of Health (SDOH) Interventions     Readmission Risk Interventions Readmission Risk Prevention Plan 08/19/2018  Transportation Screening Complete  Medication Review Oceanographer) Complete  PCP or Specialist appointment within 3-5 days of discharge Complete  HRI or Home Care Consult Complete  SW Recovery Care/Counseling Consult Complete  Palliative Care Screening Not Applicable  Skilled Nursing Facility Not Applicable  Some recent data might be hidden

## 2018-08-30 ENCOUNTER — Emergency Department
Admission: EM | Admit: 2018-08-30 | Discharge: 2018-08-31 | Disposition: A | Discharge status: Home / Self Care | Payer: Medicare Other | Source: Home / Self Care | Attending: Emergency Medicine | Admitting: Emergency Medicine

## 2018-08-30 ENCOUNTER — Other Ambulatory Visit: Payer: Self-pay

## 2018-08-30 DIAGNOSIS — F10929 Alcohol use, unspecified with intoxication, unspecified: Secondary | ICD-10-CM | POA: Diagnosis not present

## 2018-08-30 DIAGNOSIS — F1020 Alcohol dependence, uncomplicated: Secondary | ICD-10-CM | POA: Insufficient documentation

## 2018-08-30 DIAGNOSIS — Z79899 Other long term (current) drug therapy: Secondary | ICD-10-CM

## 2018-08-30 DIAGNOSIS — I10 Essential (primary) hypertension: Secondary | ICD-10-CM | POA: Insufficient documentation

## 2018-08-30 DIAGNOSIS — E119 Type 2 diabetes mellitus without complications: Secondary | ICD-10-CM

## 2018-08-30 DIAGNOSIS — Z7984 Long term (current) use of oral hypoglycemic drugs: Secondary | ICD-10-CM | POA: Insufficient documentation

## 2018-08-30 DIAGNOSIS — E860 Dehydration: Secondary | ICD-10-CM

## 2018-08-30 DIAGNOSIS — F17210 Nicotine dependence, cigarettes, uncomplicated: Secondary | ICD-10-CM

## 2018-08-30 DIAGNOSIS — F10280 Alcohol dependence with alcohol-induced anxiety disorder: Secondary | ICD-10-CM | POA: Diagnosis not present

## 2018-08-30 DIAGNOSIS — F10231 Alcohol dependence with withdrawal delirium: Secondary | ICD-10-CM | POA: Diagnosis not present

## 2018-08-30 DIAGNOSIS — F1994 Other psychoactive substance use, unspecified with psychoactive substance-induced mood disorder: Secondary | ICD-10-CM | POA: Diagnosis not present

## 2018-08-30 DIAGNOSIS — J9601 Acute respiratory failure with hypoxia: Secondary | ICD-10-CM | POA: Diagnosis not present

## 2018-08-30 LAB — COMPREHENSIVE METABOLIC PANEL
ALT: 31 U/L (ref 0–44)
AST: 40 U/L (ref 15–41)
Albumin: 4.1 g/dL (ref 3.5–5.0)
Alkaline Phosphatase: 83 U/L (ref 38–126)
Anion gap: 13 (ref 5–15)
BUN: 9 mg/dL (ref 6–20)
CO2: 26 mmol/L (ref 22–32)
Calcium: 8.8 mg/dL — ABNORMAL LOW (ref 8.9–10.3)
Chloride: 104 mmol/L (ref 98–111)
Creatinine, Ser: 0.76 mg/dL (ref 0.61–1.24)
GFR calc Af Amer: >60 mL/min (ref >60)
GFR calc non Af Amer: >60 mL/min (ref >60)
Glucose, Bld: 156 mg/dL — ABNORMAL HIGH (ref 70–99)
Potassium: 3.6 mmol/L (ref 3.5–5.1)
Sodium: 143 mmol/L (ref 135–145)
Total Bilirubin: 0.5 mg/dL (ref 0.3–1.2)
Total Protein: 7.5 g/dL (ref 6.5–8.1)

## 2018-08-30 LAB — CBC
HCT: 46 % (ref 39.0–52.0)
Hemoglobin: 14.8 g/dL (ref 13.0–17.0)
MCH: 29.4 pg (ref 26.0–34.0)
MCHC: 32.2 g/dL (ref 30.0–36.0)
MCV: 91.5 fL (ref 80.0–100.0)
Platelets: 337 10*3/uL (ref 150–400)
RBC: 5.03 MIL/uL (ref 4.22–5.81)
RDW: 20.3 % — ABNORMAL HIGH (ref 11.5–15.5)
WBC: 4.9 10*3/uL (ref 4.0–10.5)
nRBC: 0 % (ref 0.0–0.2)

## 2018-08-30 LAB — ETHANOL: Alcohol, Ethyl (B): 248 mg/dL — ABNORMAL HIGH (ref <10)

## 2018-08-30 MED ORDER — SODIUM CHLORIDE 0.9 % IV BOLUS
1000.0000 mL | Freq: Once | INTRAVENOUS | Status: AC
  Administered 2018-08-31: 02:00:00 1000 mL via INTRAVENOUS

## 2018-08-30 MED ORDER — CHLORDIAZEPOXIDE HCL 25 MG PO CAPS
50.0000 mg | ORAL_CAPSULE | Freq: Once | ORAL | Status: AC
  Administered 2018-08-31: 50 mg via ORAL
  Filled 2018-08-30: qty 2

## 2018-08-30 MED ORDER — LORAZEPAM 2 MG/ML IJ SOLN: 2.0000 mg | Freq: Once | INTRAMUSCULAR | Status: DC

## 2018-08-30 NOTE — ED Triage Notes (Signed)
Patient reports seeking detox from alcohol.  Patient states unsure when his last drink was.  Patient has noticeable tremor.

## 2018-08-31 NOTE — ED Provider Notes (Addendum)
[facility] Emergency Department Provider Note  ____________________________________________  Time seen: Approximately 1:58 AM  I have reviewed the triage vital signs and the nursing notes.   HISTORY  Chief Complaint Detox    HPI Kurt Horton is a 58 y.o. male with a history of alcohol abuse diabetes hypertension myasthenia gravis and atrial fibrillation who comes the ED complaining of tremulousness, worried that he might be having alcohol withdrawal.  I previously saw him in the ED 4 days ago.  He reports that he is still drinking his 20 airplane bottles of liquor a day and has not changed his habit and has been drinking since discharge from the hospital.  He is unable to tell me how long he was in the hospital when I admitted him 4 days ago, or how long he has been home since then.  He is not sure when his last drink was or how much he had to drink today.  He is overall not very forthcoming about his current state of health and habits.      Past Medical History:  Diagnosis Date  . A-fib (HCC)   . Alcohol abuse   . Alcohol withdrawal (HCC) 11/12/2016  . Diabetes mellitus without complication (HCC)   . DJD (degenerative joint disease)   . Hypertension   . Myasthenia gravis (HCC)   . Renal disorder      Patient Active Problem List   Diagnosis Date Noted  . Alcohol withdrawal (HCC) 08/26/2018  . A-fib (HCC) 08/17/2018  . Tobacco use disorder 05/27/2018  . GI bleed 02/04/2018  . Alcohol intoxication (HCC) 12/22/2017  . Malnutrition of moderate degree Oct 24, 202019  . Chronic combined systolic and diastolic CHF (congestive heart failure) (HCC) 08/13/2017  . Alcohol withdrawal delirium (HCC) 02/26/2017  . Atrial fibrillation with RVR (HCC) 02/25/2017  . Acute alcoholic intoxication with complication (HCC)   . Alcohol use disorder, severe, dependence (HCC)   . Substance induced mood disorder (HCC) 11/13/2016  . SVT (supraventricular tachycardia)  (HCC) 11/12/2016  . HTN (hypertension) 11/12/2016  . EtOH dependence (HCC) 11/12/2016  . Lumbar radiculopathy 08/24/2016  . Major depressive disorder, recurrent severe without psychotic features (HCC) 08/24/2016  . Chronic low back pain 01/04/2016  . Pressure ulcer 12/29/2015  . Osteomyelitis (HCC) 12/26/2015  . PVD (peripheral vascular disease) (HCC) 12/26/2015  . Type II diabetes mellitus with manifestations (HCC) 12/26/2015  . Myasthenia gravis (HCC) 12/26/2015     Past Surgical History:  Procedure Laterality Date  . AMPUTATION TOE Left 12/29/2015   Procedure: AMPUTATION TOE;  Surgeon: Gwyneth Revels, DPM;  Location: [facility] ORS;  Service: Podiatry;  Laterality: Left;  . ESOPHAGOGASTRODUODENOSCOPY N/A 02/05/2018   Procedure: ESOPHAGOGASTRODUODENOSCOPY (EGD);  Surgeon: Pasty Spillers, MD;  Location: [facility] ENDOSCOPY;  Service: Endoscopy;  Laterality: N/A;  . PERIPHERAL VASCULAR CATHETERIZATION N/A 12/27/2015   Procedure: Lower Extremity Angiography;  Surgeon: Annice Needy, MD;  Location: [facility] INVASIVE CV LAB;  Service: Cardiovascular;  Laterality: N/A;  . right hip surgery    . right shoulder surgery       Prior to Admission medications   Medication Sig Start Date End Date Taking? Authorizing Provider  diltiazem (CARDIZEM CD) 120 MG 24 hr capsule Take 1 capsule (120 mg total) by mouth daily. Patient not taking: Reported on 08/26/2018 06/29/18   Altamese Dilling, MD  folic acid (FOLVITE) 1 MG tablet Take 1 tablet (1 mg total) by mouth daily. Patient not taking: Reported on 08/26/2018 06/29/18   Altamese Dilling,  MD  folic acid (FOLVITE) 1 MG tablet Take 1 tablet (1 mg total) by mouth daily for 30 days. 08/29/18 09/28/18  Ihor Austin, MD  LORazepam (ATIVAN) 1 MG tablet Take 1 tablet (1 mg total) by mouth every 8 (eight) hours as needed for up to 5 days for anxiety. 08/28/18 09/02/18  Ihor Austin, MD  metFORMIN (GLUCOPHAGE) 500 MG tablet Take 1 tablet (500 mg total) by mouth  daily. Patient not taking: Reported on 08/26/2018 05/30/18   Pucilowska, Braulio Conte B, MD  metoprolol succinate (TOPROL-XL) 50 MG 24 hr tablet Take 1 tablet (50 mg total) by mouth daily. Take with or immediately following a meal. Patient not taking: Reported on 08/26/2018 05/30/18   Pucilowska, Braulio Conte B, MD  nicotine (NICODERM CQ - DOSED IN MG/24 HOURS) 14 mg/24hr patch Place 1 patch (14 mg total) onto the skin daily. 08/29/18   Pyreddy, Vivien Rota, MD  senna-docusate (SENOKOT-S) 8.6-50 MG tablet Take 1 tablet by mouth at bedtime as needed for mild constipation. Patient not taking: Reported on 08/26/2018 12/25/17   MayoAllyn Kenner, MD  testosterone cypionate (DEPOTESTOSTERONE CYPIONATE) 200 MG/ML injection Inject 200 mg into the muscle every 14 (fourteen) days.    [provider]  thiamine 100 MG tablet Take 1 tablet (100 mg total) by mouth daily for 30 days. 08/29/18 09/28/18  Ihor Austin, MD  traZODone (DESYREL) 100 MG tablet Take 1 tablet (100 mg total) by mouth at bedtime. Patient not taking: Reported on 08/26/2018 05/30/18   Shari Prows, MD     Allergies Nsaids and Other   Family History  Problem Relation Age of Onset  . Rheum arthritis Father     Social History Social History   Tobacco Use  . Smoking status: Current Every Day Smoker    Packs/day: 0.50    Types: Cigarettes  . Smokeless tobacco: Never Used  Substance Use Topics  . Alcohol use: Yes    Alcohol/week: 20.0 - 30.0 standard drinks    Types: 20 - 30 Shots of liquor per week    Comment: Last drink 3 days ago 06/22/2018- rum  . Drug use: Yes    Types: Oxycodone    Review of Systems  Constitutional:   No fever or chills.  ENT:   No sore throat. No rhinorrhea. Cardiovascular:   No chest pain or syncope. Respiratory:   No dyspnea or cough. Gastrointestinal:   Negative for abdominal pain, vomiting and diarrhea.  Musculoskeletal:   Negative for focal pain or swelling All other systems reviewed and are  negative except as documented above in ROS and HPI.  ____________________________________________   PHYSICAL EXAM:  VITAL SIGNS: ED Triage Vitals [08/30/18 2227]  Enc Vitals Group     BP 133/82     Pulse Rate (!) 107     Resp 20     Temp 98.2 F (36.8 C)     Temp Source Oral     SpO2 96 %     Weight      Height      Head Circumference      Peak Flow      Pain Score      Pain Loc      Pain Edu?      Excl. in GC?     Vital signs reviewed, nursing assessments reviewed.   Constitutional:   Alert and oriented. Non-toxic appearance. Eyes:   Conjunctivae are normal. EOMI. PERRL. ENT      Head:   Normocephalic and atraumatic.  Nose:   No congestion/rhinnorhea.       Mouth/Throat:   MMM, no pharyngeal erythema. No peritonsillar mass.       Neck:   No meningismus. Full ROM. Hematological/Lymphatic/Immunilogical:   No cervical lymphadenopathy. Cardiovascular:   RRR, heart rate 95. Symmetric bilateral radial and DP pulses.  No murmurs. Cap refill less than 2 seconds. Respiratory:   Normal respiratory effort without tachypnea/retractions. Breath sounds are clear and equal bilaterally. No wheezes/rales/rhonchi. Gastrointestinal:   Soft and nontender. Non distended. There is no CVA tenderness.  No rebound, rigidity, or guarding. Musculoskeletal:   Normal range of motion in all extremities. No joint effusions.  No lower extremity tenderness.  No edema. Neurologic:   Normal speech and language.  Normal coordination Motor grossly intact. No acute focal neurologic deficits are appreciated.  Skin:    Skin is warm, dry and intact. No rash noted.  No petechiae, purpura, or bullae.  No flushing or diaphoresis  ____________________________________________    LABS (pertinent positives/negatives) (all labs ordered are listed, but only abnormal results are displayed) Labs Reviewed  CBC - Abnormal; Notable for the following components:      Result Value   RDW 20.3 (*)    All other  components within normal limits  COMPREHENSIVE METABOLIC PANEL - Abnormal; Notable for the following components:   Glucose, Bld 156 (*)    Calcium 8.8 (*)    All other components within normal limits  ETHANOL - Abnormal; Notable for the following components:   Alcohol, Ethyl (B) 248 (*)    All other components within normal limits   ____________________________________________   EKG    ____________________________________________    RADIOLOGY  No results found.  ____________________________________________   PROCEDURES Procedures  ____________________________________________    CLINICAL IMPRESSION / ASSESSMENT AND PLAN / ED COURSE  Medications ordered in the ED: Medications  sodium chloride 0.9 % bolus 1,000 mL (1,000 mLs Intravenous New Bag/Given 08/31/18 0146)  chlordiazePOXIDE (LIBRIUM) capsule 50 mg (50 mg Oral Given 08/31/18 0146)    Pertinent labs & imaging results that were available during my care of the patient were reviewed by me and considered in my medical decision making (see chart for details).  Donnie Dalessandro Baldyga was evaluated in Emergency Department on 08/31/2018 for the symptoms described in the history of present illness. He was evaluated in the context of the global COVID-19 pandemic, which necessitated consideration that the patient might be at risk for infection with the SARS-CoV-2 virus that causes COVID-19. Institutional protocols and algorithms that pertain to the evaluation of patients at risk for COVID-19 are in a state of rapid change based on information released by regulatory bodies including the CDC and federal and state organizations. These policies and algorithms were followed during the patient's care in the ED.     Clinical Course as of Aug 30 156  Fri Aug 30, 2018  2354 HR 98. Will give benzos, IVF, reassess. Etoh level still pending. Other labs unremarkable. Exam unremarkable except for mild tremor which is varaible during exam.     [PS]  Sat Aug 31, 2018  0041 Doubt withdrawal, likely dehydration as a source of his tachycardia which does seem to be better on my exam.  Librium given, IV fluids.  Anticipate discharge.  Alcohol, Ethyl (B)(!): 248 [PS]    Clinical Course User Index [PS] Sharman Cheek, MD    ----------------------------------------- 2:59 AM on 08/31/2018 -----------------------------------------  Patient is calm when in the room by himself, but when the nurse  or myself into the room, he starts shaking immediately.  This appears to be intentional.  I reviewed extensive medical records and what symptoms he has had in the past corresponding to different ethanol levels over the past several months and his many ED visits during that time.  He has been asymptomatic and able to be discharged home with alcohol levels below 200, and is severely intoxicated with levels in the 300s.  The more I talked to him about going home the more frequently he shakes his body all over.  As best I can tell this is related to malingering and not alcohol withdrawal and discharge home is the best plan for him.  I encouraged him to seek help for his alcohol abuse and to call [facility] at 7:00 in the morning when they open.   ____________________________________________   FINAL CLINICAL IMPRESSION(S) / ED DIAGNOSES    Final diagnoses:  Uncomplicated alcohol dependence (HCC)  Dehydration     ED Discharge Orders    None      Portions of this note were generated with dragon dictation software. Dictation errors may occur despite best attempts at proofreading.   Sharman CheekStafford, Dwayne Bulkley, MD 08/31/18 [id number]    Sharman CheekStafford, Neddie Steedman, MD 08/31/18 0300

## 2018-08-31 NOTE — ED Notes (Signed)
ED Provider at bedside. 

## 2018-08-31 NOTE — ED Notes (Addendum)
Patient requesting to speak to MD prior to discharge. 

## 2018-08-31 NOTE — ED Notes (Signed)
[employer] for transport home. ETA 20-30 min

## 2018-09-01 ENCOUNTER — Other Ambulatory Visit: Payer: Self-pay

## 2018-09-01 ENCOUNTER — Emergency Department: Payer: Medicare Other

## 2018-09-01 ENCOUNTER — Encounter: Payer: Self-pay | Admitting: Emergency Medicine

## 2018-09-01 ENCOUNTER — Inpatient Hospital Stay
Admission: EM | Admit: 2018-09-01 | Discharge: 2018-09-07 | DRG: 896 | Disposition: A | Discharge status: Home / Self Care | Payer: Medicare Other | Attending: Internal Medicine | Admitting: Internal Medicine

## 2018-09-01 ENCOUNTER — Emergency Department
Admission: EM | Admit: 2018-09-01 | Discharge: 2018-09-01 | Disposition: A | Discharge status: Home / Self Care | Payer: Medicare Other | Source: Home / Self Care | Attending: Emergency Medicine | Admitting: Emergency Medicine

## 2018-09-01 DIAGNOSIS — Z79899 Other long term (current) drug therapy: Secondary | ICD-10-CM

## 2018-09-01 DIAGNOSIS — Z560 Unemployment, unspecified: Secondary | ICD-10-CM

## 2018-09-01 DIAGNOSIS — Z8261 Family history of arthritis: Secondary | ICD-10-CM

## 2018-09-01 DIAGNOSIS — I5042 Chronic combined systolic (congestive) and diastolic (congestive) heart failure: Secondary | ICD-10-CM | POA: Diagnosis present

## 2018-09-01 DIAGNOSIS — I110 Hypertensive heart disease with heart failure: Secondary | ICD-10-CM | POA: Insufficient documentation

## 2018-09-01 DIAGNOSIS — G8929 Other chronic pain: Secondary | ICD-10-CM | POA: Diagnosis present

## 2018-09-01 DIAGNOSIS — F419 Anxiety disorder, unspecified: Secondary | ICD-10-CM | POA: Diagnosis present

## 2018-09-01 DIAGNOSIS — E860 Dehydration: Secondary | ICD-10-CM

## 2018-09-01 DIAGNOSIS — E876 Hypokalemia: Secondary | ICD-10-CM | POA: Diagnosis present

## 2018-09-01 DIAGNOSIS — F17210 Nicotine dependence, cigarettes, uncomplicated: Secondary | ICD-10-CM | POA: Insufficient documentation

## 2018-09-01 DIAGNOSIS — F1020 Alcohol dependence, uncomplicated: Secondary | ICD-10-CM

## 2018-09-01 DIAGNOSIS — Z888 Allergy status to other drugs, medicaments and biological substances status: Secondary | ICD-10-CM

## 2018-09-01 DIAGNOSIS — R059 Cough, unspecified: Secondary | ICD-10-CM

## 2018-09-01 DIAGNOSIS — Z7984 Long term (current) use of oral hypoglycemic drugs: Secondary | ICD-10-CM

## 2018-09-01 DIAGNOSIS — Z886 Allergy status to analgesic agent status: Secondary | ICD-10-CM

## 2018-09-01 DIAGNOSIS — Z20828 Contact with and (suspected) exposure to other viral communicable diseases: Secondary | ICD-10-CM | POA: Diagnosis present

## 2018-09-01 DIAGNOSIS — I480 Paroxysmal atrial fibrillation: Secondary | ICD-10-CM | POA: Diagnosis present

## 2018-09-01 DIAGNOSIS — F10929 Alcohol use, unspecified with intoxication, unspecified: Secondary | ICD-10-CM

## 2018-09-01 DIAGNOSIS — J441 Chronic obstructive pulmonary disease with (acute) exacerbation: Secondary | ICD-10-CM | POA: Diagnosis present

## 2018-09-01 DIAGNOSIS — J440 Chronic obstructive pulmonary disease with acute lower respiratory infection: Secondary | ICD-10-CM | POA: Diagnosis present

## 2018-09-01 DIAGNOSIS — M545 Low back pain, unspecified: Secondary | ICD-10-CM | POA: Diagnosis present

## 2018-09-01 DIAGNOSIS — J189 Pneumonia, unspecified organism: Secondary | ICD-10-CM | POA: Diagnosis present

## 2018-09-01 DIAGNOSIS — Z79891 Long term (current) use of opiate analgesic: Secondary | ICD-10-CM

## 2018-09-01 DIAGNOSIS — Y908 Blood alcohol level of 240 mg/100 ml or more: Secondary | ICD-10-CM | POA: Insufficient documentation

## 2018-09-01 DIAGNOSIS — Z915 Personal history of self-harm: Secondary | ICD-10-CM

## 2018-09-01 DIAGNOSIS — E119 Type 2 diabetes mellitus without complications: Secondary | ICD-10-CM

## 2018-09-01 DIAGNOSIS — R296 Repeated falls: Secondary | ICD-10-CM | POA: Diagnosis present

## 2018-09-01 DIAGNOSIS — G7000 Myasthenia gravis without (acute) exacerbation: Secondary | ICD-10-CM | POA: Diagnosis present

## 2018-09-01 DIAGNOSIS — E1140 Type 2 diabetes mellitus with diabetic neuropathy, unspecified: Secondary | ICD-10-CM | POA: Diagnosis present

## 2018-09-01 DIAGNOSIS — F1994 Other psychoactive substance use, unspecified with psychoactive substance-induced mood disorder: Secondary | ICD-10-CM | POA: Diagnosis present

## 2018-09-01 DIAGNOSIS — R05 Cough: Secondary | ICD-10-CM

## 2018-09-01 DIAGNOSIS — F10231 Alcohol dependence with withdrawal delirium: Principal | ICD-10-CM | POA: Diagnosis present

## 2018-09-01 DIAGNOSIS — J9601 Acute respiratory failure with hypoxia: Secondary | ICD-10-CM | POA: Diagnosis present

## 2018-09-01 DIAGNOSIS — Z89422 Acquired absence of other left toe(s): Secondary | ICD-10-CM

## 2018-09-01 DIAGNOSIS — Z79890 Hormone replacement therapy (postmenopausal): Secondary | ICD-10-CM

## 2018-09-01 LAB — CBC WITH DIFFERENTIAL/PLATELET
Abs Immature Granulocytes: 0.02 10*3/uL (ref 0.00–0.07)
Abs Immature Granulocytes: 0.03 10*3/uL (ref 0.00–0.07)
Basophils Absolute: 0.1 10*3/uL (ref 0.0–0.1)
Basophils Absolute: 0.2 10*3/uL — ABNORMAL HIGH (ref 0.0–0.1)
Basophils Relative: 3 %
Basophils Relative: 3 %
Eosinophils Absolute: 0.1 10*3/uL (ref 0.0–0.5)
Eosinophils Absolute: 0 10*3/uL (ref 0.0–0.5)
Eosinophils Relative: 1 %
Eosinophils Relative: 1 %
HCT: 40.6 % (ref 39.0–52.0)
HCT: 40.3 % (ref 39.0–52.0)
Hemoglobin: 12.9 g/dL — ABNORMAL LOW (ref 13.0–17.0)
Hemoglobin: 12.8 g/dL — ABNORMAL LOW (ref 13.0–17.0)
Immature Granulocytes: 0 %
Immature Granulocytes: 1 %
Lymphocytes Relative: 49 %
Lymphocytes Relative: 36 %
Lymphs Abs: 2.4 10*3/uL (ref 0.7–4.0)
Lymphs Abs: 1.8 10*3/uL (ref 0.7–4.0)
MCH: 29.1 pg (ref 26.0–34.0)
MCH: 29 pg (ref 26.0–34.0)
MCHC: 31.8 g/dL (ref 30.0–36.0)
MCHC: 31.8 g/dL (ref 30.0–36.0)
MCV: 91.6 fL (ref 80.0–100.0)
MCV: 91.4 fL (ref 80.0–100.0)
Monocytes Absolute: 0.4 10*3/uL (ref 0.1–1.0)
Monocytes Absolute: 0.4 10*3/uL (ref 0.1–1.0)
Monocytes Relative: 8 %
Monocytes Relative: 9 %
Neutro Abs: 1.9 10*3/uL (ref 1.7–7.7)
Neutro Abs: 2.5 10*3/uL (ref 1.7–7.7)
Neutrophils Relative %: 39 %
Neutrophils Relative %: 50 %
Platelets: 340 10*3/uL (ref 150–400)
Platelets: 326 10*3/uL (ref 150–400)
RBC: 4.43 MIL/uL (ref 4.22–5.81)
RBC: 4.41 MIL/uL (ref 4.22–5.81)
RDW: 19.9 % — ABNORMAL HIGH (ref 11.5–15.5)
RDW: 19.5 % — ABNORMAL HIGH (ref 11.5–15.5)
WBC: 4.8 10*3/uL (ref 4.0–10.5)
WBC: 4.9 10*3/uL (ref 4.0–10.5)
nRBC: 0 % (ref 0.0–0.2)
nRBC: 0 % (ref 0.0–0.2)

## 2018-09-01 LAB — COMPREHENSIVE METABOLIC PANEL
ALT: 28 U/L (ref 0–44)
ALT: 29 U/L (ref 0–44)
AST: 33 U/L (ref 15–41)
AST: 37 U/L (ref 15–41)
Albumin: 4.1 g/dL (ref 3.5–5.0)
Albumin: 3.7 g/dL (ref 3.5–5.0)
Alkaline Phosphatase: 89 U/L (ref 38–126)
Alkaline Phosphatase: 89 U/L (ref 38–126)
Anion gap: 12 (ref 5–15)
Anion gap: 11 (ref 5–15)
BUN: 7 mg/dL (ref 6–20)
BUN: 7 mg/dL (ref 6–20)
CO2: 25 mmol/L (ref 22–32)
CO2: 25 mmol/L (ref 22–32)
Calcium: 8.4 mg/dL — ABNORMAL LOW (ref 8.9–10.3)
Calcium: 8 mg/dL — ABNORMAL LOW (ref 8.9–10.3)
Chloride: 106 mmol/L (ref 98–111)
Chloride: 105 mmol/L (ref 98–111)
Creatinine, Ser: 0.85 mg/dL (ref 0.61–1.24)
Creatinine, Ser: 0.81 mg/dL (ref 0.61–1.24)
GFR calc Af Amer: >60 mL/min (ref >60)
GFR calc Af Amer: >60 mL/min (ref >60)
GFR calc non Af Amer: >60 mL/min (ref >60)
GFR calc non Af Amer: >60 mL/min (ref >60)
Glucose, Bld: 131 mg/dL — ABNORMAL HIGH (ref 70–99)
Glucose, Bld: 136 mg/dL — ABNORMAL HIGH (ref 70–99)
Potassium: 3.3 mmol/L — ABNORMAL LOW (ref 3.5–5.1)
Potassium: 3.4 mmol/L — ABNORMAL LOW (ref 3.5–5.1)
Sodium: 143 mmol/L (ref 135–145)
Sodium: 141 mmol/L (ref 135–145)
Total Bilirubin: 0.3 mg/dL (ref 0.3–1.2)
Total Bilirubin: 0.5 mg/dL (ref 0.3–1.2)
Total Protein: 7.1 g/dL (ref 6.5–8.1)
Total Protein: 6.7 g/dL (ref 6.5–8.1)

## 2018-09-01 LAB — LACTIC ACID, PLASMA
Lactic Acid, Venous: 2.2 mmol/L (ref 0.5–1.9)
Lactic Acid, Venous: 1.8 mmol/L (ref 0.5–1.9)

## 2018-09-01 LAB — SARS CORONAVIRUS 2 BY RT PCR (HOSPITAL ORDER, PERFORMED IN ~~LOC~~ HOSPITAL LAB): SARS Coronavirus 2: NEGATIVE

## 2018-09-01 LAB — ETHANOL: Alcohol, Ethyl (B): 319 mg/dL (ref <10)

## 2018-09-01 LAB — LIPASE, BLOOD
Lipase: 62 U/L — ABNORMAL HIGH (ref 11–51)
Lipase: 61 U/L — ABNORMAL HIGH (ref 11–51)

## 2018-09-01 MED ORDER — SODIUM CHLORIDE 0.9 % IV SOLN
500.0000 mg | Freq: Once | INTRAVENOUS | Status: AC
  Administered 2018-09-02: 01:00:00 500 mg via INTRAVENOUS
  Filled 2018-09-01: qty 500

## 2018-09-01 MED ORDER — SODIUM CHLORIDE 0.9 % IV BOLUS
1000.0000 mL | Freq: Once | INTRAVENOUS | Status: AC
  Administered 2018-09-01: 05:00:00 1000 mL via INTRAVENOUS

## 2018-09-01 MED ORDER — DEXTROSE-NACL 5-0.45 % IV SOLN
Freq: Once | INTRAVENOUS | Status: AC
  Administered 2018-09-01: 22:00:00 via INTRAVENOUS

## 2018-09-01 MED ORDER — IOHEXOL 350 MG/ML SOLN
75.0000 mL | Freq: Once | INTRAVENOUS | Status: AC | PRN
  Administered 2018-09-01: 75 mL via INTRAVENOUS

## 2018-09-01 MED ORDER — SODIUM CHLORIDE 0.9 % IV SOLN
1.0000 g | Freq: Once | INTRAVENOUS | Status: AC
  Administered 2018-09-01: 23:00:00 1 g via INTRAVENOUS
  Filled 2018-09-01: qty 10

## 2018-09-01 NOTE — ED Notes (Signed)
CRITICAL LAB: ETOH is 319, Becky, Lab, Dr. Lenard Lance notified, orders received

## 2018-09-01 NOTE — ED Notes (Addendum)
Pt still has blue paper scrubs on, cell phone, lighter, cash, and card in pocket  Pt has spilled soup on pants  Pt declined to sign pad, given more water and crackers in lobby awaiting taxi, first RN to call this RN to help load pt

## 2018-09-01 NOTE — ED Notes (Signed)
Taxi called for pt, pt wheeled to lobby  Peripheral IV discontinued. Catheter intact. No signs of infiltration or redness. Gauze applied to IV site.   Pt given diet sprite to go as requested   Discharge instructions reviewed with patient. Questions fielded by this RN. Patient verbalizes understanding of instructions. Patient discharged home in stable condition per paduchowski. No acute distress noted at time of discharge.

## 2018-09-01 NOTE — ED Notes (Signed)
Pt to ct 

## 2018-09-01 NOTE — ED Triage Notes (Signed)
Pt reports started drinking today at 11 am and drank "about 20 of those little airplane bottles; I like rum"

## 2018-09-01 NOTE — ED Provider Notes (Signed)
[facility] Emergency Department Provider Note  Time seen: 2:03 AM  I have reviewed the triage vital signs and the nursing notes.   HISTORY  Chief Complaint Alcohol intoxication, fever   HPI Ollivander See is a 58 y.o. male with a past medical history of alcohol abuse, diabetes, hypertension, presents to the emergency department for alcohol intoxication and reported fever.  According to EMS they were called out to the patient's residence for significant alcohol intoxication.  Stated they found the patient on the floor at the house where he lives with family.  Patient with slurred speech consistent with alcohol intoxication.  Patient found to be febrile by EMS.  Patient denies any shortness of breath, cough, chest or abdominal pain, vomiting or diarrhea, dysuria.  Patient found to have a room air saturation of 92% by EMS.   Past Medical History:  Diagnosis Date  . A-fib (HCC)   . Alcohol abuse   . Alcohol withdrawal (HCC) 11/12/2016  . Diabetes mellitus without complication (HCC)   . DJD (degenerative joint disease)   . Hypertension   . Myasthenia gravis (HCC)   . Renal disorder     Patient Active Problem List   Diagnosis Date Noted  . Alcohol withdrawal (HCC) 08/26/2018  . A-fib (HCC) 08/17/2018  . Tobacco use disorder 05/27/2018  . GI bleed 02/04/2018  . Alcohol intoxication (HCC) 12/22/2017  . Malnutrition of moderate degree 04-07-2018  . Chronic combined systolic and diastolic CHF (congestive heart failure) (HCC) 08/13/2017  . Alcohol withdrawal delirium (HCC) 02/26/2017  . Atrial fibrillation with RVR (HCC) 02/25/2017  . Acute alcoholic intoxication with complication (HCC)   . Alcohol use disorder, severe, dependence (HCC)   . Substance induced mood disorder (HCC) 11/13/2016  . SVT (supraventricular tachycardia) (HCC) 11/12/2016  . HTN (hypertension) 11/12/2016  . EtOH dependence (HCC) 11/12/2016  . Lumbar radiculopathy 08/24/2016  . Major  depressive disorder, recurrent severe without psychotic features (HCC) 08/24/2016  . Chronic low back pain 01/04/2016  . Pressure ulcer 12/29/2015  . Osteomyelitis (HCC) 12/26/2015  . PVD (peripheral vascular disease) (HCC) 12/26/2015  . Type II diabetes mellitus with manifestations (HCC) 12/26/2015  . Myasthenia gravis (HCC) 12/26/2015    Past Surgical History:  Procedure Laterality Date  . AMPUTATION TOE Left 12/29/2015   Procedure: AMPUTATION TOE;  Surgeon: Gwyneth Revels, DPM;  Location: [facility] ORS;  Service: Podiatry;  Laterality: Left;  . ESOPHAGOGASTRODUODENOSCOPY N/A 02/05/2018   Procedure: ESOPHAGOGASTRODUODENOSCOPY (EGD);  Surgeon: Pasty Spillers, MD;  Location: [facility] ENDOSCOPY;  Service: Endoscopy;  Laterality: N/A;  . PERIPHERAL VASCULAR CATHETERIZATION N/A 12/27/2015   Procedure: Lower Extremity Angiography;  Surgeon: Annice Needy, MD;  Location: [facility] INVASIVE CV LAB;  Service: Cardiovascular;  Laterality: N/A;  . right hip surgery    . right shoulder surgery      Prior to Admission medications   Medication Sig Start Date End Date Taking? Authorizing Provider  diltiazem (CARDIZEM CD) 120 MG 24 hr capsule Take 1 capsule (120 mg total) by mouth daily. Patient not taking: Reported on 08/26/2018 06/29/18   Altamese Dilling, MD  folic acid (FOLVITE) 1 MG tablet Take 1 tablet (1 mg total) by mouth daily. Patient not taking: Reported on 08/26/2018 06/29/18   Altamese Dilling, MD  folic acid (FOLVITE) 1 MG tablet Take 1 tablet (1 mg total) by mouth daily for 30 days. 08/29/18 09/28/18  Ihor Austin, MD  LORazepam (ATIVAN) 1 MG tablet Take 1 tablet (1 mg total) by mouth every  8 (eight) hours as needed for up to 5 days for anxiety. 08/28/18 09/02/18  Ihor AustinPyreddy, Pavan, MD  metFORMIN (GLUCOPHAGE) 500 MG tablet Take 1 tablet (500 mg total) by mouth daily. Patient not taking: Reported on 08/26/2018 05/30/18   Pucilowska, Braulio ConteJolanta B, MD  metoprolol succinate (TOPROL-XL) 50 MG 24 hr tablet  Take 1 tablet (50 mg total) by mouth daily. Take with or immediately following a meal. Patient not taking: Reported on 08/26/2018 05/30/18   Pucilowska, Braulio ConteJolanta B, MD  nicotine (NICODERM CQ - DOSED IN MG/24 HOURS) 14 mg/24hr patch Place 1 patch (14 mg total) onto the skin daily. 08/29/18   Pyreddy, Vivien RotaPavan, MD  senna-docusate (SENOKOT-S) 8.6-50 MG tablet Take 1 tablet by mouth at bedtime as needed for mild constipation. Patient not taking: Reported on 08/26/2018 12/25/17   MayoAllyn Kenner, Katy Dodd, MD  testosterone cypionate (DEPOTESTOSTERONE CYPIONATE) 200 MG/ML injection Inject 200 mg into the muscle every 14 (fourteen) days.    [provider]  thiamine 100 MG tablet Take 1 tablet (100 mg total) by mouth daily for 30 days. 08/29/18 09/28/18  Ihor AustinPyreddy, Pavan, MD  traZODone (DESYREL) 100 MG tablet Take 1 tablet (100 mg total) by mouth at bedtime. Patient not taking: Reported on 08/26/2018 05/30/18   Shari ProwsPucilowska, Jolanta B, MD    Allergies  Allergen Reactions  . Nsaids Other (See Comments)    Duodenal ulcers, GI bleeding  . Other Other (See Comments)    Patient states he can't take any "mycin" medications because myasthenia gravis.    Family History  Problem Relation Age of Onset  . Rheum arthritis Father     Social History Social History   Tobacco Use  . Smoking status: Current Every Day Smoker    Packs/day: 0.50    Types: Cigarettes  . Smokeless tobacco: Never Used  Substance Use Topics  . Alcohol use: Yes    Alcohol/week: 20.0 - 30.0 standard drinks    Types: 20 - 30 Shots of liquor per week    Comment: Last drink 3 days ago 06/22/2018- rum  . Drug use: Yes    Types: Oxycodone    Review of Systems Constitutional: No fever known to patient, found to be febrile to 101 by EMS. ENT: Negative for recent illness/congestion Cardiovascular: Negative for chest pain. Respiratory: Negative for shortness of breath.  Denies cough. Gastrointestinal: Negative for abdominal pain, vomiting and  diarrhea. Genitourinary: Negative for urinary compaints Musculoskeletal: Negative for musculoskeletal complaints Skin: Negative for skin complaints  Neurological: Negative for headache All other ROS negative  ____________________________________________   PHYSICAL EXAM:  Constitutional: Alert and oriented. Well appearing and in no distress. Eyes: Normal exam ENT      Head: Normocephalic and atraumatic.      Mouth/Throat: Mucous membranes are moist. Cardiovascular: Normal rate, regular rhythm. Respiratory: Normal respiratory effort without tachypnea nor retractions. Breath sounds are clear.  No obvious wheeze rales or rhonchi. Gastrointestinal: Soft and nontender. No distention.   Musculoskeletal: Nontender with normal range of motion in all extremities. Neurologic:  Normal speech and language. No gross focal neurologic deficits  Skin:  Skin is warm, dry and intact.  Psychiatric: Mood and affect are normal.   ____________________________________________    EKG  EKG viewed and interpreted by myself shows a normal sinus rhythm at 97 bpm with a narrow QRS, mild left axis deviation, largely normal intervals with no concerning ST changes.  ____________________________________________    RADIOLOGY  Chest x-ray is negative for acute abnormality.  ____________________________________________  INITIAL IMPRESSION / ASSESSMENT AND PLAN / ED COURSE  Pertinent labs & imaging results that were available during my care of the patient were reviewed by me and considered in my medical decision making (see chart for details).   Patient presents to the emergency department for alcohol intoxication, states he thinks he is going into withdrawals.  Patient found to be febrile to 101 by EMS with a saturation of 92%.  Given these findings we will check labs including cultures, urine, urine culture, chest x-ray we will send a coronavirus test as the patient has had multiple recent ED visits which  would place him at a higher risk than the general population.  Currently the patient is awake and alert, he does have slurred speech consistent with alcohol intoxication.  Admits alcohol intoxication.  Patient's vitals are much more reassuring in the emergency department, does have a low pulse ox of 92% on room air but the patient is significantly intoxicated.  Afebrile 98.4.  Patient's work-up is overall reassuring mildly elevated lactic, we will dose IV fluids.  Ethanol is significantly elevated at 319 which is fairly typical for this patient.  Patient's coronavirus test is negative.  White blood cell count is normal.  Vitals are reassuring currently.  Patient is asking for something to eat and drink.  We will feed the patient.  He wishes to take a taxi cab home.  Patient does have an elevated ethanol level however he chronically has very high ethanol levels during his visit, and will go into DTs/withdrawals once his level gets too low.    Lance Breken Zilliox was evaluated in Emergency Department on 09/01/2018 for the symptoms described in the history of present illness. He was evaluated in the context of the global COVID-19 pandemic, which necessitated consideration that the patient might be at risk for infection with the SARS-CoV-2 virus that causes COVID-19. Institutional protocols and algorithms that pertain to the evaluation of patients at risk for COVID-19 are in a state of rapid change based on information released by regulatory bodies including the CDC and federal and state organizations. These policies and algorithms were followed during the patient's care in the ED.  ____________________________________________   FINAL CLINICAL IMPRESSION(S) / ED DIAGNOSES  Alcohol intoxication   Minna Antis, MD 09/01/18 [phone]

## 2018-09-01 NOTE — ED Notes (Signed)
O2 via [place] applied, pt falling asleep dropping sats to 88% on RA

## 2018-09-01 NOTE — ED Provider Notes (Signed)
[name] Emergency Department Provider Note  ____________________________________________  Time seen: Approximately 10:38 PM  I have reviewed the triage vital signs and the nursing notes.   HISTORY  Chief Complaint Alcohol Intoxication    Level 5 Caveat: Portions of the History and Physical including HPI and review of systems are unable to be completely obtained due to alcohol intoxication  HPI Kurt Horton is a 58 y.o. male with a history of hypertension diabetes alcohol dependence and abuse who is brought to the ED by EMS due to alcohol intoxication.  He was also IVC'd by an acquaintance due to repeated episodes of alcohol intoxication without any suggestion of SI HI hallucinations or other self-injurious behavior besides alcohol abuse.  At patient's last ED presentation earlier today, he was noted to have a room air oxygen saturation of 92%.  EMS report that his oxygen saturation was 82% on room air so they put him on nonrebreather.    Past Medical History:  Diagnosis Date  . A-fib (HCC)   . Alcohol abuse   . Alcohol withdrawal (HCC) 11/12/2016  . Diabetes mellitus without complication (HCC)   . DJD (degenerative joint disease)   . Hypertension   . Myasthenia gravis (HCC)   . Renal disorder      Patient Active Problem List   Diagnosis Date Noted  . Alcohol withdrawal (HCC) 08/26/2018  . A-fib (HCC) 08/17/2018  . Tobacco use disorder 05/27/2018  . GI bleed 02/04/2018  . Alcohol intoxication (HCC) 12/22/2017  . Malnutrition of moderate degree 10/04/2017  . Chronic combined systolic and diastolic CHF (congestive heart failure) (HCC) 08/13/2017  . Alcohol withdrawal delirium (HCC) 02/26/2017  . Atrial fibrillation with RVR (HCC) 02/25/2017  . Acute alcoholic intoxication with complication (HCC)   . Alcohol use disorder, severe, dependence (HCC)   . Substance induced mood disorder (HCC) 11/13/2016  . SVT (supraventricular tachycardia)  (HCC) 11/12/2016  . HTN (hypertension) 11/12/2016  . EtOH dependence (HCC) 11/12/2016  . Lumbar radiculopathy 08/24/2016  . Major depressive disorder, recurrent severe without psychotic features (HCC) 08/24/2016  . Chronic low back pain 01/04/2016  . Pressure ulcer 12/29/2015  . Osteomyelitis (HCC) 12/26/2015  . PVD (peripheral vascular disease) (HCC) 12/26/2015  . Type II diabetes mellitus with manifestations (HCC) 12/26/2015  . Myasthenia gravis (HCC) 12/26/2015     Past Surgical History:  Procedure Laterality Date  . AMPUTATION TOE Left 12/29/2015   Procedure: AMPUTATION TOE;  Surgeon: Gwyneth Revels, DPM;  Location: [facility] ORS;  Service: Podiatry;  Laterality: Left;  . ESOPHAGOGASTRODUODENOSCOPY N/A 02/05/2018   Procedure: ESOPHAGOGASTRODUODENOSCOPY (EGD);  Surgeon: Pasty Spillers, MD;  Location[facility] ENDOSCOPY;  Service: Endoscopy;  Laterality: N/A;  . PERIPHERAL VASCULAR CATHETERIZATION N/A 12/27/2015   Procedure: Lower Extremity Angiography;  Surgeon: Annice Needy, MD;  Location: [facility] INVASIVE CV LAB;  Service: Cardiovascular;  Laterality: N/A;  . right hip surgery    . right shoulder surgery       Prior to Admission medications   Medication Sig Start Date End Date Taking? Authorizing Provider  diltiazem (CARDIZEM CD) 120 MG 24 hr capsule Take 1 capsule (120 mg total) by mouth daily. 06/29/18   Altamese Dilling, MD  folic acid (FOLVITE) 1 MG tablet Take 1 tablet (1 mg total) by mouth daily. 06/29/18   Altamese Dilling, MD  folic acid (FOLVITE) 1 MG tablet Take 1 tablet (1 mg total) by mouth daily for 30 days. 08/29/18 09/28/18  Ihor Austin, MD  LORazepam (ATIVAN) 1 MG  tablet Take 1 tablet (1 mg total) by mouth every 8 (eight) hours as needed for up to 5 days for anxiety. 08/28/18 09/02/18  Ihor AustinPyreddy, Pavan, MD  metFORMIN (GLUCOPHAGE) 500 MG tablet Take 1 tablet (500 mg total) by mouth daily. 05/30/18   Pucilowska, Braulio ConteJolanta B, MD  metoprolol succinate (TOPROL-XL) 50 MG 24  hr tablet Take 1 tablet (50 mg total) by mouth daily. Take with or immediately following a meal. 05/30/18   Pucilowska, Jolanta B, MD  nicotine (NICODERM CQ - DOSED IN MG/24 HOURS) 14 mg/24hr patch Place 1 patch (14 mg total) onto the skin daily. 08/29/18   Pyreddy, Vivien RotaPavan, MD  senna-docusate (SENOKOT-S) 8.6-50 MG tablet Take 1 tablet by mouth at bedtime as needed for mild constipation. 12/25/17   Mayo, Allyn KennerKaty Dodd, MD  testosterone cypionate (DEPOTESTOSTERONE CYPIONATE) 200 MG/ML injection Inject 200 mg into the muscle every 14 (fourteen) days.    [provider]  thiamine 100 MG tablet Take 1 tablet (100 mg total) by mouth daily for 30 days. 08/29/18 09/28/18  Ihor AustinPyreddy, Pavan, MD  traZODone (DESYREL) 100 MG tablet Take 1 tablet (100 mg total) by mouth at bedtime. 05/30/18   Pucilowska, Ellin GoodieJolanta B, MD     Allergies Nsaids and Other   Family History  Problem Relation Age of Onset  . Rheum arthritis Father     Social History Social History   Tobacco Use  . Smoking status: Current Every Day Smoker    Packs/day: 0.50    Types: Cigarettes  . Smokeless tobacco: Never Used  Substance Use Topics  . Alcohol use: Yes    Alcohol/week: 20.0 - 30.0 standard drinks    Types: 20 - 30 Shots of liquor per week    Comment: Last drink 3 days ago 06/22/2018- rum  . Drug use: Yes    Types: Oxycodone    Review of Systems Level 5 Caveat: Portions of the History and Physical including HPI and review of systems are unable to be completely obtained due to patient being intoxicated  Constitutional:   No known fever.  ENT:   No rhinorrhea. Cardiovascular:   No chest pain or syncope. Respiratory: Positive cough. Gastrointestinal:   Negative for abdominal pain, vomiting and diarrhea.  Musculoskeletal:   Negative for focal pain or swelling ____________________________________________   PHYSICAL EXAM:  VITAL SIGNS: ED Triage Vitals  Enc Vitals Group     BP 09/01/18 2133 (!) 155/99     Pulse Rate  09/01/18 2133 (!) 109     Resp 09/01/18 2133 20     Temp 09/01/18 2133 98.9 F (37.2 C)     Temp Source 09/01/18 2133 Axillary     SpO2 09/01/18 2133 93 %     Weight 09/01/18 2134 205 lb 0.4 oz (93 kg)     Height 09/01/18 2134 5\' 11"  (1.803 m)     Head Circumference --      Peak Flow --      Pain Score 09/01/18 2133 0     Pain Loc --      Pain Edu? --      Excl. in GC? --     Vital signs reviewed, nursing assessments reviewed.   Constitutional:   Stuporous, not oriented.  Ill-appearing. Eyes:   Conjunctivae are normal. EOMI. PERRL. ENT      Head:   Normocephalic and atraumatic.      Nose:   No congestion/rhinnorhea.       Mouth/Throat:   Dry mucous membranes, no  pharyngeal erythema. No peritonsillar mass.       Neck:   No meningismus. Full ROM. Hematological/Lymphatic/Immunilogical:   No cervical lymphadenopathy. Cardiovascular:   Tachycardia heart rate 110. Symmetric bilateral radial and DP pulses.  No murmurs. Cap refill less than 2 seconds. Respiratory:   Normal respiratory effort without tachypnea/retractions.  Coarse breath sounds diffusely.. Gastrointestinal:   Soft and nontender. Non distended. There is no CVA tenderness.  No rebound, rigidity, or guarding.  Musculoskeletal:   Normal range of motion in all extremities. No joint effusions.  No lower extremity tenderness.  No edema. Neurologic:   Stuporous GCS = E1V2M4 =7  Motor grossly intact. No acute focal neurologic deficits are appreciated.  Skin:    Skin is warm, dry and intact. No rash noted.  No petechiae, purpura, or bullae.  ____________________________________________    LABS (pertinent positives/negatives) (all labs ordered are listed, but only abnormal results are displayed) Labs Reviewed  CBC WITH DIFFERENTIAL/PLATELET - Abnormal; Notable for the following components:      Result Value   Hemoglobin 12.8 (*)    RDW 19.5 (*)    Basophils Absolute 0.2 (*)    All other components within normal limits   BLOOD GAS, VENOUS - Abnormal; Notable for the following components:   pO2, Ven 65.0 (*)    All other components within normal limits  COMPREHENSIVE METABOLIC PANEL  URINALYSIS, COMPLETE (UACMP) WITH MICROSCOPIC  URINE DRUG SCREEN, QUALITATIVE ([facility] ONLY)  LIPASE, BLOOD  ACETAMINOPHEN LEVEL  ETHANOL  SALICYLATE LEVEL   ____________________________________________   EKG    ____________________________________________    RADIOLOGY  Dg Chest Portable 1 View  Result Date: 09/01/2018 CLINICAL DATA:  Cough and hypoxia EXAM: PORTABLE CHEST 1 VIEW COMPARISON:  09/01/2018, 08/12/2018, 06/30/2018 FINDINGS: Mild bronchitic changes at the bases. Stable cardiomediastinal silhouette. No focal airspace disease or effusion. No pneumothorax. IMPRESSION: No active disease. Electronically Signed   By: Jasmine Pang M.D.   On: 09/01/2018 22:05   Dg Chest Port 1 View  Result Date: 09/01/2018 CLINICAL DATA:  Fever EXAM: PORTABLE CHEST 1 VIEW COMPARISON:  08/12/2018 FINDINGS: Cardiomegaly. Bibasilar atelectasis. No effusions or edema. No acute bony abnormality. IMPRESSION: Cardiomegaly.  Bibasilar atelectasis. Electronically Signed   By: Charlett Nose M.D.   On: 09/01/2018 03:04    ____________________________________________   PROCEDURES .Critical Care Performed by: Sharman Cheek, MD Authorized by: Sharman Cheek, MD   Critical care provider statement:    Critical care time (minutes):  35   Critical care time was exclusive of:  Separately billable procedures and treating other patients   Critical care was necessary to treat or prevent imminent or life-threatening deterioration of the following conditions:  Respiratory failure and CNS failure or compromise   Critical care was time spent personally by me on the following activities:  Development of treatment plan with patient or surrogate, discussions with consultants, evaluation of patient's response to treatment, examination of patient,  obtaining history from patient or surrogate, ordering and performing treatments and interventions, ordering and review of laboratory studies, ordering and review of radiographic studies, pulse oximetry, re-evaluation of patient's condition and review of old charts    ____________________________________________  DIFFERENTIAL DIAGNOSIS   Pneumonia, dehydration, alcoholic ketoacidosis, pulmonary embolism, depressed respiratory drive due to alcohol intoxication  CLINICAL IMPRESSION / ASSESSMENT AND PLAN / ED COURSE  Pertinent labs & imaging results that were available during my care of the patient were reviewed by me and considered in my medical decision making (see chart for details).  Asaf Trentin Knappenberger was evaluated in Emergency Department on 09/01/2018 for the symptoms described in the history of present illness. He was evaluated in the context of the global COVID-19 pandemic, which necessitated consideration that the patient might be at risk for infection with the SARS-CoV-2 virus that causes COVID-19. Institutional protocols and algorithms that pertain to the evaluation of patients at risk for COVID-19 are in a state of rapid change based on information released by regulatory bodies including the CDC and federal and state organizations. These policies and algorithms were followed during the patient's care in the ED.   Patient presents with hypoxia and alcohol intoxication.  He is also tachycardic.  Doubt sepsis, but he may have pneumonia or severe dehydration.  Check labs and chest x-ray.  If x-ray is negative will need a CT scan given the progressive hypoxia and his complicated state of health with recurrent bouts of intoxication and frequent visits to the ED.  COVID test was negative recently.  We will plan to hospitalize tonight given his severe presentation.  IVC paperwork appears insufficient, does not site any acute psychiatric symptoms.  The patient is well-known to me and this  department, and I will reverse the IVC.  His symptoms can be reassessed when he is lucid.      ____________________________________________   FINAL CLINICAL IMPRESSION(S) / ED DIAGNOSES    Final diagnoses:  Alcoholic intoxication with complication (HCC)  Acute respiratory failure with hypoxia [facility])     ED Discharge Orders    None      Portions of this note were generated with dragon dictation software. Dictation errors may occur despite best attempts at proofreading.   Sharman Cheek, MD 09/01/18 [phone]

## 2018-09-01 NOTE — ED Notes (Signed)
Pt with iv infiltration in ct of iv contrast. Iv discontinued intact by jeff, ct tech. Ice to antecubital will bel placed after ct complete. Danelle Earthly, rn over with ultrasound to attempt ultrasound guided iv insertion.

## 2018-09-01 NOTE — ED Triage Notes (Signed)
Patient presents to Emergency Department via [facility] from home with complaints of wanting "help with drinking alcohol"  Per EMS pt febrile, and baseline O2 sat is 92% so NRB applied, pt in room on mask  Pt reports fall 3 years prior with disc herniation   History of ETOH abuse,   Pt O&A x 4

## 2018-09-01 NOTE — ED Notes (Signed)
Report from dee, rn.  

## 2018-09-01 NOTE — ED Notes (Signed)
Pt drank 2 diet sprite and ate crackers

## 2018-09-01 NOTE — ED Triage Notes (Signed)
Pt arrived via [facility] with ETOH intoxication. Pt has emesis all over himself and oxygen sat on ems arrival was 80%. Pt placed on NRB.

## 2018-09-01 NOTE — ED Notes (Signed)
CRITICAL LAB: LACTIC is 2.2, [facility], Dr. Lenard Lance notified, orders received

## 2018-09-01 NOTE — ED Notes (Addendum)
14mg  nicotine transderm patch came off the back of pt's left arm   Red top sent to lab

## 2018-09-02 DIAGNOSIS — R296 Repeated falls: Secondary | ICD-10-CM | POA: Diagnosis present

## 2018-09-02 DIAGNOSIS — F1994 Other psychoactive substance use, unspecified with psychoactive substance-induced mood disorder: Secondary | ICD-10-CM

## 2018-09-02 DIAGNOSIS — Z915 Personal history of self-harm: Secondary | ICD-10-CM | POA: Diagnosis not present

## 2018-09-02 DIAGNOSIS — F1020 Alcohol dependence, uncomplicated: Secondary | ICD-10-CM | POA: Diagnosis present

## 2018-09-02 DIAGNOSIS — E876 Hypokalemia: Secondary | ICD-10-CM | POA: Diagnosis present

## 2018-09-02 DIAGNOSIS — G8929 Other chronic pain: Secondary | ICD-10-CM | POA: Diagnosis present

## 2018-09-02 DIAGNOSIS — Z886 Allergy status to analgesic agent status: Secondary | ICD-10-CM | POA: Diagnosis not present

## 2018-09-02 DIAGNOSIS — F17210 Nicotine dependence, cigarettes, uncomplicated: Secondary | ICD-10-CM | POA: Diagnosis present

## 2018-09-02 DIAGNOSIS — I110 Hypertensive heart disease with heart failure: Secondary | ICD-10-CM | POA: Diagnosis present

## 2018-09-02 DIAGNOSIS — F10280 Alcohol dependence with alcohol-induced anxiety disorder: Secondary | ICD-10-CM | POA: Diagnosis not present

## 2018-09-02 DIAGNOSIS — F419 Anxiety disorder, unspecified: Secondary | ICD-10-CM | POA: Diagnosis present

## 2018-09-02 DIAGNOSIS — Z20828 Contact with and (suspected) exposure to other viral communicable diseases: Secondary | ICD-10-CM | POA: Diagnosis present

## 2018-09-02 DIAGNOSIS — I5042 Chronic combined systolic (congestive) and diastolic (congestive) heart failure: Secondary | ICD-10-CM | POA: Diagnosis present

## 2018-09-02 DIAGNOSIS — J441 Chronic obstructive pulmonary disease with (acute) exacerbation: Secondary | ICD-10-CM | POA: Diagnosis present

## 2018-09-02 DIAGNOSIS — E1140 Type 2 diabetes mellitus with diabetic neuropathy, unspecified: Secondary | ICD-10-CM | POA: Diagnosis present

## 2018-09-02 DIAGNOSIS — I480 Paroxysmal atrial fibrillation: Secondary | ICD-10-CM | POA: Diagnosis present

## 2018-09-02 DIAGNOSIS — Z89422 Acquired absence of other left toe(s): Secondary | ICD-10-CM | POA: Diagnosis not present

## 2018-09-02 DIAGNOSIS — F10929 Alcohol use, unspecified with intoxication, unspecified: Secondary | ICD-10-CM | POA: Diagnosis not present

## 2018-09-02 DIAGNOSIS — J189 Pneumonia, unspecified organism: Secondary | ICD-10-CM | POA: Diagnosis present

## 2018-09-02 DIAGNOSIS — Z79890 Hormone replacement therapy (postmenopausal): Secondary | ICD-10-CM | POA: Diagnosis not present

## 2018-09-02 DIAGNOSIS — Y908 Blood alcohol level of 240 mg/100 ml or more: Secondary | ICD-10-CM | POA: Diagnosis present

## 2018-09-02 DIAGNOSIS — J440 Chronic obstructive pulmonary disease with acute lower respiratory infection: Secondary | ICD-10-CM | POA: Diagnosis present

## 2018-09-02 DIAGNOSIS — J9601 Acute respiratory failure with hypoxia: Secondary | ICD-10-CM | POA: Diagnosis present

## 2018-09-02 DIAGNOSIS — G7000 Myasthenia gravis without (acute) exacerbation: Secondary | ICD-10-CM | POA: Diagnosis present

## 2018-09-02 DIAGNOSIS — F10231 Alcohol dependence with withdrawal delirium: Secondary | ICD-10-CM | POA: Diagnosis present

## 2018-09-02 DIAGNOSIS — Z8261 Family history of arthritis: Secondary | ICD-10-CM | POA: Diagnosis not present

## 2018-09-02 LAB — URINALYSIS, COMPLETE (UACMP) WITH MICROSCOPIC
Bacteria, UA: NONE SEEN
Bilirubin Urine: NEGATIVE
Glucose, UA: NEGATIVE mg/dL
Hgb urine dipstick: NEGATIVE
Ketones, ur: NEGATIVE mg/dL
Leukocytes,Ua: NEGATIVE
Nitrite: NEGATIVE
Protein, ur: NEGATIVE mg/dL
Specific Gravity, Urine: >1.046 — ABNORMAL HIGH (ref 1.005–1.030)
Squamous Epithelial / HPF: NONE SEEN (ref 0–5)
pH: 5 (ref 5.0–8.0)

## 2018-09-02 LAB — HEMOGLOBIN A1C
Hgb A1c MFr Bld: 5.9 % — ABNORMAL HIGH (ref 4.8–5.6)
Mean Plasma Glucose: 122.63 mg/dL

## 2018-09-02 LAB — URINE DRUG SCREEN, QUALITATIVE (ARMC ONLY)
Amphetamines, Ur Screen: NOT DETECTED
Barbiturates, Ur Screen: NOT DETECTED
Benzodiazepine, Ur Scrn: POSITIVE — AB
Cannabinoid 50 Ng, Ur ~~LOC~~: NOT DETECTED
Cocaine Metabolite,Ur ~~LOC~~: NOT DETECTED
MDMA (Ecstasy)Ur Screen: NOT DETECTED
Methadone Scn, Ur: NOT DETECTED
Opiate, Ur Screen: NOT DETECTED
Phencyclidine (PCP) Ur S: NOT DETECTED
Tricyclic, Ur Screen: NOT DETECTED

## 2018-09-02 LAB — COMPREHENSIVE METABOLIC PANEL
ALT: 24 U/L (ref 0–44)
AST: 31 U/L (ref 15–41)
Albumin: 3.1 g/dL — ABNORMAL LOW (ref 3.5–5.0)
Alkaline Phosphatase: 73 U/L (ref 38–126)
Anion gap: 11 (ref 5–15)
BUN: 6 mg/dL (ref 6–20)
CO2: 26 mmol/L (ref 22–32)
Calcium: 7.9 mg/dL — ABNORMAL LOW (ref 8.9–10.3)
Chloride: 103 mmol/L (ref 98–111)
Creatinine, Ser: 0.69 mg/dL (ref 0.61–1.24)
GFR calc Af Amer: >60 mL/min (ref >60)
GFR calc non Af Amer: >60 mL/min (ref >60)
Glucose, Bld: 157 mg/dL — ABNORMAL HIGH (ref 70–99)
Potassium: 3.2 mmol/L — ABNORMAL LOW (ref 3.5–5.1)
Sodium: 140 mmol/L (ref 135–145)
Total Bilirubin: 0.4 mg/dL (ref 0.3–1.2)
Total Protein: 5.7 g/dL — ABNORMAL LOW (ref 6.5–8.1)

## 2018-09-02 LAB — GLUCOSE, CAPILLARY
Glucose-Capillary: 126 mg/dL — ABNORMAL HIGH (ref 70–99)
Glucose-Capillary: 134 mg/dL — ABNORMAL HIGH (ref 70–99)
Glucose-Capillary: 133 mg/dL — ABNORMAL HIGH (ref 70–99)
Glucose-Capillary: 104 mg/dL — ABNORMAL HIGH (ref 70–99)
Glucose-Capillary: 110 mg/dL — ABNORMAL HIGH (ref 70–99)
Glucose-Capillary: 100 mg/dL — ABNORMAL HIGH (ref 70–99)

## 2018-09-02 LAB — ACETAMINOPHEN LEVEL: Acetaminophen (Tylenol), Serum: <10 ug/mL — ABNORMAL LOW (ref 10–30)

## 2018-09-02 LAB — PROCALCITONIN: Procalcitonin: <0.1 ng/mL

## 2018-09-02 LAB — MRSA PCR SCREENING: MRSA by PCR: NEGATIVE

## 2018-09-02 LAB — SALICYLATE LEVEL: Salicylate Lvl: <7 mg/dL (ref 2.8–30.0)

## 2018-09-02 LAB — PHOSPHORUS: Phosphorus: 3.2 mg/dL (ref 2.5–4.6)

## 2018-09-02 LAB — MAGNESIUM: Magnesium: 1.5 mg/dL — ABNORMAL LOW (ref 1.7–2.4)

## 2018-09-02 LAB — ETHANOL: Alcohol, Ethyl (B): 325 mg/dL (ref <10)

## 2018-09-02 MED ORDER — VITAMIN B-1 100 MG PO TABS: 100.0000 mg | ORAL_TABLET | Freq: Every day | ORAL | Status: DC

## 2018-09-02 MED ORDER — CHLORDIAZEPOXIDE HCL 25 MG PO CAPS
25.0000 mg | ORAL_CAPSULE | Freq: Four times a day (QID) | ORAL | Status: AC
  Administered 2018-09-02 – 2018-09-03 (×3): 25 mg via ORAL
  Filled 2018-09-02 (×4): qty 1

## 2018-09-02 MED ORDER — DILTIAZEM HCL ER COATED BEADS 120 MG PO CP24
120.0000 mg | ORAL_CAPSULE | Freq: Every day | ORAL | Status: DC
  Administered 2018-09-02 – 2018-09-07 (×6): 120 mg via ORAL
  Filled 2018-09-02 (×6): qty 1

## 2018-09-02 MED ORDER — METOPROLOL TARTRATE 5 MG/5ML IV SOLN: 5.0000 mg | INTRAVENOUS | Status: AC

## 2018-09-02 MED ORDER — PIPERACILLIN-TAZOBACTAM 3.375 G IVPB: 3.3750 g | Freq: Three times a day (TID) | INTRAVENOUS | Status: DC

## 2018-09-02 MED ORDER — INSULIN ASPART 100 UNIT/ML ~~LOC~~ SOLN
0.0000 [IU] | Freq: Three times a day (TID) | SUBCUTANEOUS | Status: DC
  Administered 2018-09-02 – 2018-09-04 (×3): 2 [IU] via SUBCUTANEOUS
  Administered 2018-09-05 – 2018-09-06 (×3): 3 [IU] via SUBCUTANEOUS
  Administered 2018-09-06 (×2): 2 [IU] via SUBCUTANEOUS
  Administered 2018-09-07: 3 [IU] via SUBCUTANEOUS
  Filled 2018-09-02 (×9): qty 1

## 2018-09-02 MED ORDER — POTASSIUM CHLORIDE 20 MEQ PO PACK
60.0000 meq | PACK | Freq: Once | ORAL | Status: AC
  Administered 2018-09-02: 07:00:00 60 meq via ORAL
  Filled 2018-09-02: qty 3

## 2018-09-02 MED ORDER — MAGNESIUM SULFATE 2 GM/50ML IV SOLN: 2.0000 g | Freq: Once | INTRAVENOUS | Status: DC

## 2018-09-02 MED ORDER — METOPROLOL TARTRATE 5 MG/5ML IV SOLN
INTRAVENOUS | Status: AC
  Filled 2018-09-02: qty 5

## 2018-09-02 MED ORDER — TRAZODONE HCL 100 MG PO TABS
100.0000 mg | ORAL_TABLET | Freq: Every day | ORAL | Status: DC
  Administered 2018-09-02 – 2018-09-06 (×6): 100 mg via ORAL
  Filled 2018-09-02 (×6): qty 1

## 2018-09-02 MED ORDER — VITAMIN B-1 100 MG PO TABS
100.0000 mg | ORAL_TABLET | Freq: Every day | ORAL | Status: DC
  Administered 2018-09-02: 100 mg via ORAL

## 2018-09-02 MED ORDER — CHLORDIAZEPOXIDE HCL 25 MG PO CAPS
25.0000 mg | ORAL_CAPSULE | Freq: Every day | ORAL | Status: AC
  Administered 2018-09-05: 25 mg via ORAL
  Filled 2018-09-02 (×2): qty 1

## 2018-09-02 MED ORDER — ADULT MULTIVITAMIN W/MINERALS CH: 1.0000 | ORAL_TABLET | Freq: Every day | ORAL | Status: DC

## 2018-09-02 MED ORDER — CHLORDIAZEPOXIDE HCL 25 MG PO CAPS
25.0000 mg | ORAL_CAPSULE | ORAL | Status: AC
  Administered 2018-09-04: 25 mg via ORAL
  Filled 2018-09-02 (×3): qty 1

## 2018-09-02 MED ORDER — ENOXAPARIN SODIUM 40 MG/0.4ML ~~LOC~~ SOLN
40.0000 mg | SUBCUTANEOUS | Status: DC
  Administered 2018-09-02 – 2018-09-06 (×6): 40 mg via SUBCUTANEOUS
  Filled 2018-09-02 (×6): qty 0.4

## 2018-09-02 MED ORDER — THIAMINE HCL 100 MG/ML IJ SOLN
Freq: Once | INTRAVENOUS | Status: AC
  Administered 2018-09-02: 06:00:00 via INTRAVENOUS
  Filled 2018-09-02: qty 1000

## 2018-09-02 MED ORDER — POTASSIUM CHLORIDE 20 MEQ PO PACK: 40.0000 meq | PACK | Freq: Once | ORAL | Status: DC

## 2018-09-02 MED ORDER — AMOXICILLIN-POT CLAVULANATE 875-125 MG PO TABS
1.0000 | ORAL_TABLET | Freq: Two times a day (BID) | ORAL | Status: DC
  Administered 2018-09-02 – 2018-09-07 (×10): 1 via ORAL
  Filled 2018-09-02 (×10): qty 1

## 2018-09-02 MED ORDER — LORAZEPAM 2 MG/ML IJ SOLN
2.0000 mg | INTRAMUSCULAR | Status: DC | PRN
  Filled 2018-09-02: qty 1

## 2018-09-02 MED ORDER — ACETAMINOPHEN 325 MG PO TABS
650.0000 mg | ORAL_TABLET | Freq: Four times a day (QID) | ORAL | Status: DC | PRN
  Administered 2018-09-02 – 2018-09-04 (×2): 650 mg via ORAL
  Filled 2018-09-02 (×2): qty 2

## 2018-09-02 MED ORDER — INSULIN ASPART 100 UNIT/ML ~~LOC~~ SOLN
0.0000 [IU] | Freq: Every day | SUBCUTANEOUS | Status: DC
  Administered 2018-09-04 – 2018-09-05 (×2): 2 [IU] via SUBCUTANEOUS
  Administered 2018-09-06: 3 [IU] via SUBCUTANEOUS
  Filled 2018-09-02 (×3): qty 1

## 2018-09-02 MED ORDER — FOLIC ACID 1 MG PO TABS
1.0000 mg | ORAL_TABLET | Freq: Every day | ORAL | Status: DC
  Administered 2018-09-03 – 2018-09-07 (×5): 1 mg via ORAL
  Filled 2018-09-02 (×6): qty 1

## 2018-09-02 MED ORDER — HYDROXYZINE HCL 25 MG PO TABS
25.0000 mg | ORAL_TABLET | Freq: Four times a day (QID) | ORAL | Status: AC | PRN
  Administered 2018-09-02 – 2018-09-04 (×5): 25 mg via ORAL
  Filled 2018-09-02 (×6): qty 1

## 2018-09-02 MED ORDER — CHLORDIAZEPOXIDE HCL 25 MG PO CAPS
25.0000 mg | ORAL_CAPSULE | Freq: Four times a day (QID) | ORAL | Status: AC | PRN
  Administered 2018-09-02 – 2018-09-05 (×2): 25 mg via ORAL

## 2018-09-02 MED ORDER — CHLORDIAZEPOXIDE HCL 25 MG PO CAPS
25.0000 mg | ORAL_CAPSULE | Freq: Three times a day (TID) | ORAL | Status: AC
  Administered 2018-09-03 – 2018-09-04 (×3): 25 mg via ORAL
  Filled 2018-09-02 (×4): qty 1

## 2018-09-02 MED ORDER — LORAZEPAM 2 MG PO TABS
2.0000 mg | ORAL_TABLET | ORAL | Status: DC | PRN
  Administered 2018-09-02 – 2018-09-06 (×11): 2 mg via ORAL
  Filled 2018-09-02 (×10): qty 1
  Filled 2018-09-02: qty 2

## 2018-09-02 MED ORDER — THIAMINE HCL 100 MG/ML IJ SOLN
100.0000 mg | Freq: Once | INTRAMUSCULAR | Status: AC
  Administered 2018-09-02: 100 mg via INTRAMUSCULAR
  Filled 2018-09-02: qty 2

## 2018-09-02 MED ORDER — DIAZEPAM 2 MG PO TABS
2.0000 mg | ORAL_TABLET | Freq: Two times a day (BID) | ORAL | Status: DC
  Administered 2018-09-02 – 2018-09-03 (×2): 2 mg via ORAL
  Filled 2018-09-02 (×2): qty 1

## 2018-09-02 MED ORDER — FOLIC ACID 1 MG PO TABS
1.0000 mg | ORAL_TABLET | Freq: Every day | ORAL | Status: DC
  Administered 2018-09-02: 1 mg via ORAL

## 2018-09-02 MED ORDER — METOPROLOL SUCCINATE ER 50 MG PO TB24: 50.0000 mg | ORAL_TABLET | Freq: Every day | ORAL | Status: DC

## 2018-09-02 MED ORDER — VITAMIN B-1 100 MG PO TABS
100.0000 mg | ORAL_TABLET | Freq: Every day | ORAL | Status: DC
  Administered 2018-09-03 – 2018-09-07 (×5): 100 mg via ORAL
  Filled 2018-09-02 (×6): qty 1

## 2018-09-02 MED ORDER — MAGNESIUM SULFATE 2 GM/50ML IV SOLN
2.0000 g | Freq: Once | INTRAVENOUS | Status: AC
  Administered 2018-09-02: 2 g via INTRAVENOUS
  Filled 2018-09-02: qty 50

## 2018-09-02 MED ORDER — ADULT MULTIVITAMIN W/MINERALS CH
1.0000 | ORAL_TABLET | Freq: Every day | ORAL | Status: DC
  Administered 2018-09-02: 1 via ORAL
  Filled 2018-09-02: qty 1

## 2018-09-02 MED ORDER — ONDANSETRON 4 MG PO TBDP
4.0000 mg | ORAL_TABLET | Freq: Four times a day (QID) | ORAL | Status: AC | PRN
  Filled 2018-09-02: qty 1

## 2018-09-02 MED ORDER — CHLORDIAZEPOXIDE HCL 5 MG PO CAPS
25.0000 mg | ORAL_CAPSULE | Freq: Four times a day (QID) | ORAL | Status: DC | PRN
  Administered 2018-09-02: 03:00:00 25 mg via ORAL
  Filled 2018-09-02: qty 2

## 2018-09-02 MED ORDER — METFORMIN HCL 500 MG PO TABS
500.0000 mg | ORAL_TABLET | Freq: Every day | ORAL | Status: DC
  Administered 2018-09-02 – 2018-09-07 (×6): 500 mg via ORAL
  Filled 2018-09-02 (×7): qty 1

## 2018-09-02 MED ORDER — BENZONATATE 100 MG PO CAPS
100.0000 mg | ORAL_CAPSULE | Freq: Three times a day (TID) | ORAL | Status: DC | PRN
  Administered 2018-09-02 – 2018-09-07 (×8): 100 mg via ORAL
  Filled 2018-09-02 (×9): qty 1

## 2018-09-02 MED ORDER — NICOTINE 14 MG/24HR TD PT24
14.0000 mg | MEDICATED_PATCH | Freq: Every day | TRANSDERMAL | Status: DC
  Administered 2018-09-02 – 2018-09-07 (×6): 14 mg via TRANSDERMAL
  Filled 2018-09-02 (×7): qty 1

## 2018-09-02 MED ORDER — ADULT MULTIVITAMIN W/MINERALS CH
1.0000 | ORAL_TABLET | Freq: Every day | ORAL | Status: DC
  Administered 2018-09-03 – 2018-09-07 (×5): 1 via ORAL
  Filled 2018-09-02 (×5): qty 1

## 2018-09-02 MED ORDER — OXYCODONE-ACETAMINOPHEN 5-325 MG PO TABS
1.0000 | ORAL_TABLET | Freq: Four times a day (QID) | ORAL | Status: DC | PRN
  Administered 2018-09-02 – 2018-09-03 (×3): 1 via ORAL
  Filled 2018-09-02 (×3): qty 1

## 2018-09-02 MED ORDER — LOPERAMIDE HCL 2 MG PO CAPS
2.0000 mg | ORAL_CAPSULE | ORAL | Status: AC | PRN
  Filled 2018-09-02: qty 2

## 2018-09-02 NOTE — Consult Note (Signed)
PHARMACY CONSULT NOTE - FOLLOW UP  Pharmacy Consult for Electrolyte Monitoring and Replacement   Recent Labs: Potassium (mmol/L)  Date Value  09/02/2018 3.2 (L)   Magnesium (mg/dL)  Date Value  45/80/9983 1.5 (L)   Calcium (mg/dL)  Date Value  38/25/0539 7.9 (L)   Albumin (g/dL)  Date Value  76/73/4193 3.1 (L)  08/24/2016 4.2   Phosphorus (mg/dL)  Date Value  79/06/4095 3.2   Sodium (mmol/L)  Date Value  09/02/2018 140     Assessment: Patient admitted for acute alcohol overdose/withdrawal and CAP. Patient also has history of afib with RVR.   Goal of Therapy:  Potassium ~4.0 and Magnesium ~2.0  Plan:  Patient received Mg sulfate 2 g IV this AM and KCl 60 mEq packet x 1. Ordered an additional Mg sulfate 2 g IV x 1 and KCl 40 mEq packet x 1. Will order electrolytes with AM labs.  Pharmacy will continue to monitor.  Mauri Reading, PharmD Pharmacy Resident  09/02/2018 1:26 PM

## 2018-09-02 NOTE — ED Notes (Signed)
ED TO INPATIENT HANDOFF REPORT  ED Nurse Name and Phone #: April/Yahya Boldman [phone]  S Name/Age/Gender Kurt Horton 58 y.o. male Room/Bed: ED24A/ED24A  Code Status   Code Status: Prior  Home/SNF/Other  Patient oriented to: self, place, time and situation Is this baseline? Yes   Triage Complete: Triage complete  Chief Complaint intoxication  Triage Note Pt arrived via [facility] with ETOH intoxication. Pt has emesis all over himself and oxygen sat on ems arrival was 80%. Pt placed on NRB.     Allergies Allergies  Allergen Reactions  . Nsaids Other (See Comments)    Duodenal ulcers, GI bleeding  . Other Other (See Comments)    Patient states he can't take any "mycin" medications because myasthenia gravis.    Level of Care/Admitting Diagnosis ED Disposition    ED Disposition Condition Comment   Admit  Hospital Area: [facility] [[id number]]  Level of Care: Stepdown [14]  Covid Evaluation: N/A  Diagnosis: Alcohol intoxication [facility]) [[id number]]  Admitting Physician: Delfino Lovett [[id number]]  Attending Physician: Delfino Lovett [[id number]]  Estimated length of stay: past midnight tomorrow  Certification:: I certify this patient will need inpatient services for at least 2 midnights  PT Class (Do Not Modify): Inpatient [101]  PT Acc Code (Do Not Modify): Private [1]       B Medical/Surgery History Past Medical History:  Diagnosis Date  . A-fib (HCC)   . Alcohol abuse   . Alcohol withdrawal (HCC) 11/12/2016  . Diabetes mellitus without complication (HCC)   . DJD (degenerative joint disease)   . Hypertension   . Myasthenia gravis (HCC)   . Renal disorder    Past Surgical History:  Procedure Laterality Date  . AMPUTATION TOE Left 12/29/2015   Procedure: AMPUTATION TOE;  Surgeon: Gwyneth Revels, DPM;  Location: [facility] ORS;  Service: Podiatry;  Laterality: Left;  . ESOPHAGOGASTRODUODENOSCOPY N/A 02/05/2018   Procedure: ESOPHAGOGASTRODUODENOSCOPY (EGD);  Surgeon:  Pasty Spillers, MD;  Location: [facility] ENDOSCOPY;  Service: Endoscopy;  Laterality: N/A;  . PERIPHERAL VASCULAR CATHETERIZATION N/A 12/27/2015   Procedure: Lower Extremity Angiography;  Surgeon: Annice Needy, MD;  Location: [facility] INVASIVE CV LAB;  Service: Cardiovascular;  Laterality: N/A;  . right hip surgery    . right shoulder surgery       A IV Location/Drains/Wounds Patient Lines/Drains/Airways Status   Active Line/Drains/Airways    Name:   Placement date:   Placement time:   Site:   Days:   Peripheral IV 09/01/18 Right Hand   09/01/18    2204    Hand   1   Peripheral IV 09/01/18 Right Antecubital   09/01/18    2243    Antecubital   1   Peripheral IV 09/01/18 Left;Upper Arm   09/01/18    2345    Arm   1          Intake/Output Last 24 hours  Intake/Output Summary (Last 24 hours) at 09/02/2018 0147 Last data filed at 09/02/2018 0120 Gross per 24 hour  Intake 1100 ml  Output -  Net 1100 ml    Labs/Imaging Results for orders placed or performed during the hospital encounter of 09/01/18 (from the past 48 hour(s))  Blood gas, venous     Status: Abnormal (Preliminary result)   Collection Time: 09/01/18  9:43 PM  Result Value Ref Range   FIO2 PENDING    pH, Ven 7.33 7.250 - 7.430   pCO2, Ven 53 44.0 - 60.0 mmHg   pO2,  Ven 65.0 (H) 32.0 - 45.0 mmHg   Bicarbonate 27.9 20.0 - 28.0 mmol/L   Acid-Base Excess 1.0 0.0 - 2.0 mmol/L   O2 Saturation 90.8 %   Patient temperature 37.0    Collection site VENOUS    Sample type VENOUS     Comment: Performed at [facility], [address] [place], [place] [address]   Mechanical Rate PENDING   Comprehensive metabolic panel     Status: Abnormal   Collection Time: 09/01/18 10:05 PM  Result Value Ref Range   Sodium 141 135 - 145 mmol/L   Potassium 3.4 (L) 3.5 - 5.1 mmol/L   Chloride 105 98 - 111 mmol/L   CO2 25 22 - 32 mmol/L   Glucose, Bld 136 (H) 70 - 99 mg/dL   BUN 7 6 - 20 mg/dL   Creatinine, Ser 1.910.81 0.61 - 1.24 mg/dL    Calcium 8.0 (L) 8.9 - 10.3 mg/dL   Total Protein 6.7 6.5 - 8.1 g/dL   Albumin 3.7 3.5 - 5.0 g/dL   AST 37 15 - 41 U/L   ALT 29 0 - 44 U/L   Alkaline Phosphatase 89 38 - 126 U/L   Total Bilirubin 0.5 0.3 - 1.2 mg/dL   GFR calc non Af Amer >60 >60 mL/min   GFR calc Af Amer >60 >60 mL/min   Anion gap 11 5 - 15    Comment: Performed at [facility], [address] [place], [place] [address]  Lipase, blood     Status: Abnormal   Collection Time: 09/01/18 10:05 PM  Result Value Ref Range   Lipase 61 (H) 11 - 51 U/L    Comment: Performed at [facility], [address] [place], [place] [address]  CBC with Differential     Status: Abnormal   Collection Time: 09/01/18 10:05 PM  Result Value Ref Range   WBC 4.9 4.0 - 10.5 K/uL   RBC 4.41 4.22 - 5.81 MIL/uL   Hemoglobin 12.8 (L) 13.0 - 17.0 g/dL   HCT 08.640.3 57.839.0 - 46.952.0 %   MCV 91.4 80.0 - 100.0 fL   MCH 29.0 26.0 - 34.0 pg   MCHC 31.8 30.0 - 36.0 g/dL   RDW 62.919.5 (H) 52.811.5 - 41.315.5 %   Platelets 326 150 - 400 K/uL   nRBC 0.0 0.0 - 0.2 %   Neutrophils Relative % 50 %   Neutro Abs 2.5 1.7 - 7.7 K/uL   Lymphocytes Relative 36 %   Lymphs Abs 1.8 0.7 - 4.0 K/uL   Monocytes Relative 9 %   Monocytes Absolute 0.4 0.1 - 1.0 K/uL   Eosinophils Relative 1 %   Eosinophils Absolute 0.0 0.0 - 0.5 K/uL   Basophils Relative 3 %   Basophils Absolute 0.2 (H) 0.0 - 0.1 K/uL   Immature Granulocytes 1 %   Abs Immature Granulocytes 0.03 0.00 - 0.07 K/uL    Comment: Performed at [facility], [address] [place], [place] [address]  Acetaminophen level     Status: Abnormal   Collection Time: 09/01/18 11:45 PM  Result Value Ref Range   Acetaminophen (Tylenol), Serum <10 (L) 10 - 30 ug/mL    Comment: (NOTE) Therapeutic concentrations vary significantly. A range of 10-30 ug/mL  may be an effective concentration for many patients. However, some  are best treated at concentrations outside of this range. Acetaminophen  concentrations >150 ug/mL at 4 hours after ingestion  and >50 ug/mL at 12 hours after  ingestion are often associated with  toxic reactions. Performed at [facility], [address] [place], [place] [address]   Ethanol     Status: Abnormal   Collection Time: 09/01/18 11:45 PM  Result Value Ref Range   Alcohol, Ethyl (B) 325 (HH) <10 mg/dL    Comment: CRITICAL RESULT CALLED TO, READ BACK BY AND VERIFIED WITH DAN Markevion Lattin RN 09/02/2018 @ 0016 RDW (NOTE) Lowest detectable limit for serum alcohol is 10 mg/dL. For medical purposes only. Performed at [facility], [address] [place], [place] [address]   Salicylate level     Status: None   Collection Time: 09/01/18 11:45 PM  Result Value Ref Range   Salicylate Lvl <7.0 2.8 - 30.0 mg/dL    Comment: Performed at [facility], [address] [place], [place] [address]   Ct Angio Chest Pe W And/or Wo Contrast  Result Date: 09/02/2018 CLINICAL DATA:  58 year old male with shortness of breath. Negative for COVID-19 on 09/01/2018. EXAM: CT ANGIOGRAPHY CHEST WITH CONTRAST TECHNIQUE: Multidetector CT imaging of the chest was performed using the standard protocol during bolus administration of intravenous contrast. Multiplanar CT image reconstructions and MIPs were obtained to evaluate the vascular anatomy. CONTRAST:  75mL OMNIPAQUE IOHEXOL 350 MG/ML SOLN COMPARISON:  Portable chest 09/01/2018 and earlier. Chest CT 01/19/2018. FINDINGS: Cardiovascular: Good contrast bolus timing in the pulmonary arterial tree. Mild respiratory motion. No focal filling defect identified in the pulmonary arteries to suggest acute pulmonary embolism. No calcified coronary artery atherosclerosis is evident. No cardiomegaly or pericardial effusion. Negative visible aorta. Mediastinum/Nodes: Negative. Lungs/Pleura: Major airways are patent. Lower lung volumes. Multifocal round and peripheral patchy ground-glass opacity in the right lower lobe  (series 12, image 56). Subtle areas of ground-glass opacity elsewhere in the right lung. Mostly dependent ground-glass opacity in the left lower lobe more resembles atelectasis. No pleural effusion or consolidation. Upper Abdomen: Negative visible liver aside from possible a patent steatosis. Negative visible gallbladder, spleen, pancreas, adrenal glands and bowel. Stable visible kidneys. Musculoskeletal: Occasional chronic rib fractures. Mild scoliosis. Advanced lower cervical and cervicothoracic junction spine degeneration. No acute osseous abnormality identified. Review of the MIP images confirms the above findings. IMPRESSION: 1. No evidence of acute pulmonary embolus. 2. Lower lung volumes with multifocal ground-glass opacity in the right lower lobe suspicious for acute viral/atypical respiratory infection - note this patient tested negative for COVID-19 yesterday. No pleural effusion. Electronically Signed   By: Odessa Fleming M.D.   On: 09/02/2018 00:13   Dg Chest Portable 1 View  Result Date: 09/01/2018 CLINICAL DATA:  Cough and hypoxia EXAM: PORTABLE CHEST 1 VIEW COMPARISON:  09/01/2018, 08/12/2018, 06/30/2018 FINDINGS: Mild bronchitic changes at the bases. Stable cardiomediastinal silhouette. No focal airspace disease or effusion. No pneumothorax. IMPRESSION: No active disease. Electronically Signed   By: Jasmine Pang M.D.   On: 09/01/2018 22:05   Dg Chest Port 1 View  Result Date: 09/01/2018 CLINICAL DATA:  Fever EXAM: PORTABLE CHEST 1 VIEW COMPARISON:  08/12/2018 FINDINGS: Cardiomegaly. Bibasilar atelectasis. No effusions or edema. No acute bony abnormality. IMPRESSION: Cardiomegaly.  Bibasilar atelectasis. Electronically Signed   By: Charlett Nose M.D.   On: 09/01/2018 03:04    Pending Labs Unresulted Labs (From admission, onward)    Start     Ordered   09/02/18 0131  Hemoglobin A1c  Once,   STAT    Comments:  To assess prior glycemic control    09/02/18 0132   09/01/18 2142  Urinalysis,  Complete w Microscopic  ONCE - STAT,   STAT     09/01/18 2142   09/01/18 2142  Urine Drug Screen, Qualitative  Once,   STAT     09/01/18 2142          Vitals/Pain Today's Vitals   09/02/18 0008 09/02/18 0015 09/02/18 0030 09/02/18 0045  BP: (!) 151/84  (!) 129/96   Pulse: 98 99 97 98  Resp: 20 16 (!) 23 18  Temp:      TempSrc:      SpO2: 99% 100% 100% 100%  Weight:      Height:      PainSc:        Isolation Precautions No active isolations  Medications Medications  azithromycin (ZITHROMAX) 500 mg in sodium chloride 0.9 % 250 mL IVPB (500 mg Intravenous New Bag/Given 09/02/18 0051)  diltiazem (CARDIZEM CD) 24 hr capsule 120 mg (has no administration in time range)  folic acid (FOLVITE) tablet 1 mg (has no administration in time range)  metFORMIN (GLUCOPHAGE) tablet 500 mg (has no administration in time range)  nicotine (NICODERM CQ - dosed in mg/24 hours) patch 14 mg (has no administration in time range)  thiamine (VITAMIN B-1) tablet 100 mg (has no administration in time range)  traZODone (DESYREL) tablet 100 mg (has no administration in time range)  thiamine (B-1) injection 100 mg (has no administration in time range)  thiamine (VITAMIN B-1) tablet 100 mg (has no administration in time range)  multivitamin with minerals tablet 1 tablet (has no administration in time range)  hydrOXYzine (ATARAX/VISTARIL) tablet 25 mg (has no administration in time range)  loperamide (IMODIUM) capsule 2-4 mg (has no administration in time range)  ondansetron (ZOFRAN-ODT) disintegrating tablet 4 mg (has no administration in time range)  chlordiazePOXIDE (LIBRIUM) capsule 25 mg (has no administration in time range)  insulin aspart (novoLOG) injection 0-15 Units (has no administration in time range)  insulin aspart (novoLOG) injection 0-5 Units (has no administration in time range)  dextrose 5 %-0.45 % sodium chloride infusion ( Intravenous Stopped 09/02/18 0120)  cefTRIAXone (ROCEPHIN) 1 g in  sodium chloride 0.9 % 100 mL IVPB (0 g Intravenous Stopped 09/02/18 0049)  iohexol (OMNIPAQUE) 350 MG/ML injection 75 mL (75 mLs Intravenous Contrast Given 09/01/18 2256)    Mobility walks High fall risk   Focused Assessments Cardiac Assessment Handoff:    Lab Results  Component Value Date   CKTOTAL 589 (H) 04/15/2018   TROPONINI <0.03 06/24/2018   No results found for: DDIMER Does the Patient currently have chest pain? No  , Pulmonary Assessment Handoff:  Lung sounds: Bilateral Breath Sounds: Diminished O2 Device: Room Air O2 Flow Rate (L/min): 2 L/min      R Recommendations: See Admitting Provider Note  Report given to:   Additional Notes:

## 2018-09-02 NOTE — ED Notes (Signed)
Pt lying in bed, watching tv, no tremors or sweating noted.

## 2018-09-02 NOTE — Consult Note (Signed)
[facility] Face-to-Face Psychiatry Consult   Reason for Consult: Follow-up consult for this patient with alcohol abuse and delirium tremens Referring Physician: Dr. Manson Passey Patient Identification: Kurt Horton MRN:  [id number] Principal Diagnosis: Alcohol intoxication (HCC) Diagnosis:  Principal Problem:   Alcohol intoxication (HCC) Active Problems:   Chronic low back pain   EtOH dependence (HCC)   Substance induced mood disorder (HCC)   Alcohol use disorder, severe, dependence (HCC)   Total Time spent with patient: 30 minutes  Subjective: "I wasn't trying to kill myself"  HPI:   Kurt Horton is a 58 y.o. male  with a known history of EtOH abuse, hypertension, diabetes mellitus type 2, myasthenia gravis, as well as a history of paroxysmal atrial fibrillation.  He presented to the emergency room reporting tremors and alcohol withdrawal.  Patient was seen earlier in the day around 5 AM for similar complaint and discharged home at that time.  He is complaining of shortness of breath with a 2-day history of nonproductive cough.  He denies having been aware of fevers or chills.  He denies having had nausea, vomiting, diarrhea.  No abdominal pain.  Ethanol level on arrival is 325.  He states he usually drinks 20 two ounce bottles of alcohol a day.  His last reported drink was yesterday 09/01/2018.  He continues to smoke about 1 pack/day with a 40-pack-year history of tobacco abuse. On arrival, his magnesium is 1.4.  His CTA chest demonstrated right lower lobe groundglass opacity suspicious for viral pneumonia.  However, patient was negative for COVID-19 when tested yesterday.  No evidence of leukocytosis on labs.  He was started on Rocephin and azithromycin in the emergency room.  Admitted to ICU after EMS personal found the patient on the floor at the house where he lives with family. Patient with slurred speech consistent with alcohol intoxication. Patient found to be febrile by EMS. Patient  denies any shortness of breath, cough, chest or abdominal pain, vomiting or diarrhea, dysuria. Patient also has hx of recurrent falls, severe diabetic neuropathy.  On Admission to MICU he is arousable but confused.  He is unable to get out of bed due to weakness, reports he has been drinking chronically and had several episodes of ETOH withdrawal in recent past at home. Patient had small volume of vomitus on neck suggestive of likely recurrent aspiration while inebriated  Psychiatry consult is requested for concerns of alcohol abuse.  Patient is seen and chart is reviewed.    He had an inpatient psychiatric admission for detox and suicidal ideation from May 25, 2018 - May 30, 2018.  Patient reports his longest period of sobriety is 5 days. He has had 12 presentations to the ED for alcohol intoxication since that time, with 4 episodes requiring medical admission.    During evaluation Kurt Horton is alert and orientated x 4: calm and cooperative; and mood congruent with affect. He does not appear to be responding to internal/ external stimuli or delusional thoughts. Patient denies suicidal/ self-harm/ homicidal ideation, psychosis, and paranoia. Patient answered all questions appropriately.  He denies SI, HI, AVH.  Patient does report anxiety and withdrawal tremors, for which she is requesting medications for detox while hospitalized.  Past Psychiatric History: Depression but mainly a long history of alcohol abuse without any recent ability to stay sober meanwhile with his physical compromise getting worse  Risk to Self:  No Risk to Others:  No Prior Inpatient Therapy:  Yes Prior Outpatient Therapy:  Yes, last at  [facility] January 18 through May 30, 2018  Past Medical History:  Past Medical History:  Diagnosis Date  . A-fib (HCC)   . Alcohol abuse   . Alcohol withdrawal (HCC) 11/12/2016  . Diabetes mellitus without complication (HCC)   . DJD (degenerative  joint disease)   . Hypertension   . Myasthenia gravis (HCC)   . Renal disorder     Past Surgical History:  Procedure Laterality Date  . AMPUTATION TOE Left 12/29/2015   Procedure: AMPUTATION TOE;  Surgeon: Gwyneth Revels, DPM;  Location: [facility] ORS;  Service: Podiatry;  Laterality: Left;  . ESOPHAGOGASTRODUODENOSCOPY N/A 02/05/2018   Procedure: ESOPHAGOGASTRODUODENOSCOPY (EGD);  Surgeon: Pasty Spillers, MD;  Location: [facility] ENDOSCOPY;  Service: Endoscopy;  Laterality: N/A;  . PERIPHERAL VASCULAR CATHETERIZATION N/A 12/27/2015   Procedure: Lower Extremity Angiography;  Surgeon: Annice Needy, MD;  Location: [facility] INVASIVE CV LAB;  Service: Cardiovascular;  Laterality: N/A;  . right hip surgery    . right shoulder surgery     Family History:  Family History  Problem Relation Age of Onset  . Rheum arthritis Father    Family Psychiatric  History: None  Social History:  Social History   Substance and Sexual Activity  Alcohol Use Yes  . Alcohol/week: 20.0 - 30.0 standard drinks  . Types: 20 - 30 Shots of liquor per week   Comment: Last drink 3 days ago 06/22/2018- rum     Social History   Substance and Sexual Activity  Drug Use Yes  . Types: Oxycodone    Social History   Socioeconomic History  . Marital status: Divorced    Spouse name: Not on file  . Number of children: Not on file  . Years of education: Not on file  . Highest education level: Not on file  Occupational History  . Not on file  Social Needs  . Financial resource strain: Not on file  . Food insecurity:    Worry: Not on file    Inability: Not on file  . Transportation needs:    Medical: Not on file    Non-medical: Not on file  Tobacco Use  . Smoking status: Current Every Day Smoker    Packs/day: 0.50    Types: Cigarettes  . Smokeless tobacco: Never Used  Substance and Sexual Activity  . Alcohol use: Yes    Alcohol/week: 20.0 - 30.0 standard drinks    Types: 20 - 30 Shots of liquor per week     Comment: Last drink 3 days ago 06/22/2018- rum  . Drug use: Yes    Types: Oxycodone  . Sexual activity: Not on file  Lifestyle  . Physical activity:    Days per week: Not on file    Minutes per session: Not on file  . Stress: Not on file  Relationships  . Social connections:    Talks on phone: Not on file    Gets together: Not on file    Attends religious service: Not on file    Active member of club or organization: Not on file    Attends meetings of clubs or organizations: Not on file    Relationship status: Not on file  Other Topics Concern  . Not on file  Social History Narrative   Live with Parents, ambulates with a cane. Falls lately.   Additional Social History: Lives with parents: Father and mother are 16 years old, father has rheumatoid arthritis and mother has cardiac condition. Currently unemployed, on disability  Patient reports that he does save most of his disability check, but spends at least $500 of each on alcohol.  He states that his parents have discussed taking his money from him, but have not done so to this point. Patient has 2 siblings with whom he has little contact with.    Allergies:   Allergies  Allergen Reactions  . Nsaids Other (See Comments)    Duodenal ulcers, GI bleeding  . Other Other (See Comments)    Patient states he can't take any "mycin" medications because myasthenia gravis.    Labs:  Results for orders placed or performed during the hospital encounter of 09/01/18 (from the past 48 hour(s))  Urinalysis, Complete w Microscopic     Status: Abnormal   Collection Time: 09/01/18  3:01 AM  Result Value Ref Range   Color, Urine YELLOW (A) YELLOW   APPearance CLEAR (A) CLEAR   Specific Gravity, Urine >1.046 (H) 1.005 - 1.030   pH 5.0 5.0 - 8.0   Glucose, UA NEGATIVE NEGATIVE mg/dL   Hgb urine dipstick NEGATIVE NEGATIVE   Bilirubin Urine NEGATIVE NEGATIVE   Ketones, ur NEGATIVE NEGATIVE mg/dL   Protein, ur NEGATIVE NEGATIVE mg/dL    Nitrite NEGATIVE NEGATIVE   Leukocytes,Ua NEGATIVE NEGATIVE   WBC, UA 0-5 0 - 5 WBC/hpf   Bacteria, UA NONE SEEN NONE SEEN   Squamous Epithelial / LPF NONE SEEN 0 - 5   Mucus PRESENT     Comment: Performed at [facility], [address], [place], [place] [address]  Urine Drug Screen, Qualitative     Status: Abnormal   Collection Time: 09/01/18  3:01 AM  Result Value Ref Range   Tricyclic, Ur Screen NONE DETECTED NONE DETECTED   Amphetamines, Ur Screen NONE DETECTED NONE DETECTED   MDMA (Ecstasy)Ur Screen NONE DETECTED NONE DETECTED   Cocaine Metabolite,Ur [place] NONE DETECTED NONE DETECTED   Opiate, Ur Screen NONE DETECTED NONE DETECTED   Phencyclidine (PCP) Ur S NONE DETECTED NONE DETECTED   Cannabinoid 50 Ng, Ur [place] NONE DETECTED NONE DETECTED   Barbiturates, Ur Screen NONE DETECTED NONE DETECTED   Benzodiazepine, Ur Scrn POSITIVE (A) NONE DETECTED   Methadone Scn, Ur NONE DETECTED NONE DETECTED    Comment: (NOTE) Tricyclics + metabolites, urine    Cutoff 1000 ng/mL Amphetamines + metabolites, urine  Cutoff 1000 ng/mL MDMA (Ecstasy), urine              Cutoff 500 ng/mL Cocaine Metabolite, urine          Cutoff 300 ng/mL Opiate + metabolites, urine        Cutoff 300 ng/mL Phencyclidine (PCP), urine         Cutoff 25 ng/mL Cannabinoid, urine                 Cutoff 50 ng/mL Barbiturates + metabolites, urine  Cutoff 200 ng/mL Benzodiazepine, urine              Cutoff 200 ng/mL Methadone, urine                   Cutoff 300 ng/mL The urine drug screen provides only a preliminary, unconfirmed analytical test result and should not be used for non-medical purposes. Clinical consideration and professional judgment should be applied to any positive drug screen result due to possible interfering substances. A more specific alternate chemical method must be used in order to obtain a confirmed analytical result. Gas chromatography / mass spectrometry (GC/MS) is  the  preferred confirmat ory method. Performed at [facility], [address] [place], [place] [address]   Blood gas, venous     Status: Abnormal (Preliminary result)   Collection Time: 09/01/18  9:43 PM  Result Value Ref Range   FIO2 PENDING    pH, Ven 7.33 7.250 - 7.430   pCO2, Ven 53 44.0 - 60.0 mmHg   pO2, Ven 65.0 (H) 32.0 - 45.0 mmHg   Bicarbonate 27.9 20.0 - 28.0 mmol/L   Acid-Base Excess 1.0 0.0 - 2.0 mmol/L   O2 Saturation 90.8 %   Patient temperature 37.0    Collection site VENOUS    Sample type VENOUS     Comment: Performed at [facility], [address] [place], [place] [address]   Mechanical Rate PENDING   Comprehensive metabolic panel     Status: Abnormal   Collection Time: 09/01/18 10:05 PM  Result Value Ref Range   Sodium 141 135 - 145 mmol/L   Potassium 3.4 (L) 3.5 - 5.1 mmol/L   Chloride 105 98 - 111 mmol/L   CO2 25 22 - 32 mmol/L   Glucose, Bld 136 (H) 70 - 99 mg/dL   BUN 7 6 - 20 mg/dL   Creatinine, Ser 0.98 0.61 - 1.24 mg/dL   Calcium 8.0 (L) 8.9 - 10.3 mg/dL   Total Protein 6.7 6.5 - 8.1 g/dL   Albumin 3.7 3.5 - 5.0 g/dL   AST 37 15 - 41 U/L   ALT 29 0 - 44 U/L   Alkaline Phosphatase 89 38 - 126 U/L   Total Bilirubin 0.5 0.3 - 1.2 mg/dL   GFR calc non Af Amer >60 >60 mL/min   GFR calc Af Amer >60 >60 mL/min   Anion gap 11 5 - 15    Comment: Performed at [facility], [address] [place], [place] [address]  Lipase, blood     Status: Abnormal   Collection Time: 09/01/18 10:05 PM  Result Value Ref Range   Lipase 61 (H) 11 - 51 U/L    Comment: Performed at [facility], [address] [place], [place] [address]  CBC with Differential     Status: Abnormal   Collection Time: 09/01/18 10:05 PM  Result Value Ref Range   WBC 4.9 4.0 - 10.5 K/uL   RBC 4.41 4.22 - 5.81 MIL/uL   Hemoglobin 12.8 (L) 13.0 - 17.0 g/dL   HCT 62.1 30.8 - 65.7 %   MCV 91.4 80.0 - 100.0 fL   MCH 29.0 26.0 - 34.0 pg   MCHC 31.8 30.0  - 36.0 g/dL   RDW 84.6 (H) 96.2 - 95.2 %   Platelets 326 150 - 400 K/uL   nRBC 0.0 0.0 - 0.2 %   Neutrophils Relative % 50 %   Neutro Abs 2.5 1.7 - 7.7 K/uL   Lymphocytes Relative 36 %   Lymphs Abs 1.8 0.7 - 4.0 K/uL   Monocytes Relative 9 %   Monocytes Absolute 0.4 0.1 - 1.0 K/uL   Eosinophils Relative 1 %   Eosinophils Absolute 0.0 0.0 - 0.5 K/uL   Basophils Relative 3 %   Basophils Absolute 0.2 (H) 0.0 - 0.1 K/uL   Immature Granulocytes 1 %   Abs Immature Granulocytes 0.03 0.00 - 0.07 K/uL    Comment: Performed at [facility], [address] [place], [place] [address]  Hemoglobin A1c     Status: Abnormal   Collection Time: 09/01/18 10:05 PM  Result Value Ref Range   Hgb A1c MFr Bld 5.9 (H) 4.8 - 5.6 %    Comment: (NOTE) Pre diabetes:          5.7%-6.4% Diabetes:              >6.4% Glycemic control for   <7.0% adults with diabetes    Mean Plasma Glucose 122.63 mg/dL    Comment: Performed at [facility], [address] [place], [place] [address]  Acetaminophen level     Status: Abnormal   Collection Time: 09/01/18 11:45 PM  Result Value Ref Range   Acetaminophen (Tylenol), Serum <10 (L) 10 - 30 ug/mL    Comment: (NOTE) Therapeutic concentrations vary significantly. A range of 10-30 ug/mL  may be an effective concentration for many patients. However, some  are best treated at concentrations outside of this range. Acetaminophen concentrations >150 ug/mL at 4 hours after ingestion  and >50 ug/mL at 12 hours after ingestion are often associated with  toxic reactions. Performed at [facility], [address] [place], [place] [address]   Ethanol     Status: Abnormal   Collection Time: 09/01/18 11:45 PM  Result Value Ref Range   Alcohol, Ethyl (B) 325 (HH) <10 mg/dL    Comment: CRITICAL RESULT CALLED TO, READ BACK BY AND VERIFIED WITH DAN LEWIS RN 09/02/2018 @ 0016 RDW (NOTE) Lowest detectable limit for serum alcohol is 10 mg/dL. For  medical purposes only. Performed at [facility], [address] [place], [place] [address]   Salicylate level     Status: None   Collection Time: 09/01/18 11:45 PM  Result Value Ref Range   Salicylate Lvl <7.0 2.8 - 30.0 mg/dL    Comment: Performed at [facility], [address] [place], [place] [address]  Glucose, capillary     Status: Abnormal   Collection Time: 09/02/18  2:46 AM  Result Value Ref Range   Glucose-Capillary 126 (H) 70 - 99 mg/dL  MRSA PCR Screening     Status: None   Collection Time: 09/02/18  3:01 AM  Result Value Ref Range   MRSA by PCR NEGATIVE NEGATIVE    Comment:        The GeneXpert MRSA Assay (FDA approved for NASAL specimens only), is one component of a comprehensive MRSA colonization surveillance program. It is not intended to diagnose MRSA infection nor to guide or monitor treatment for MRSA infections. Performed at [facility], [address] [place], [place] [address]   Procalcitonin - Baseline     Status: None   Collection Time: 09/02/18  4:21 AM  Result Value Ref Range   Procalcitonin <0.10 ng/mL    Comment:        Interpretation: PCT (Procalcitonin) <= 0.5 ng/mL: Systemic infection (sepsis) is not likely. Local bacterial infection is possible. (NOTE)       Sepsis PCT Algorithm           Lower Respiratory Tract                                      Infection PCT Algorithm    ----------------------------     ----------------------------         PCT < 0.25 ng/mL                PCT < 0.10 ng/mL  Strongly encourage             Strongly discourage   discontinuation of antibiotics    initiation of antibiotics    ----------------------------     -----------------------------       PCT 0.25 - 0.50 ng/mL            PCT 0.10 - 0.25 ng/mL               OR       >80% decrease in PCT            Discourage initiation of                                            antibiotics      Encourage discontinuation            of antibiotics    ----------------------------     -----------------------------         PCT >= 0.50 ng/mL              PCT 0.26 - 0.50 ng/mL               AND        <80% decrease in PCT             Encourage initiation of                                             antibiotics       Encourage continuation           of antibiotics    ----------------------------     -----------------------------        PCT >= 0.50 ng/mL                  PCT > 0.50 ng/mL               AND         increase in PCT                  Strongly encourage                                      initiation of antibiotics    Strongly encourage escalation           of antibiotics                                     -----------------------------                                           PCT <= 0.25 ng/mL                                                 OR                                        >  80% decrease in PCT                                     Discontinue / Do not initiate                                             antibiotics Performed at [facility], [address] [place], [place] [address]   Comprehensive metabolic panel     Status: Abnormal   Collection Time: 09/02/18  5:15 AM  Result Value Ref Range   Sodium 140 135 - 145 mmol/L   Potassium 3.2 (L) 3.5 - 5.1 mmol/L   Chloride 103 98 - 111 mmol/L   CO2 26 22 - 32 mmol/L   Glucose, Bld 157 (H) 70 - 99 mg/dL   BUN 6 6 - 20 mg/dL   Creatinine, Ser 7.49 0.61 - 1.24 mg/dL   Calcium 7.9 (L) 8.9 - 10.3 mg/dL   Total Protein 5.7 (L) 6.5 - 8.1 g/dL   Albumin 3.1 (L) 3.5 - 5.0 g/dL   AST 31 15 - 41 U/L   ALT 24 0 - 44 U/L   Alkaline Phosphatase 73 38 - 126 U/L   Total Bilirubin 0.4 0.3 - 1.2 mg/dL   GFR calc non Af Amer >60 >60 mL/min   GFR calc Af Amer >60 >60 mL/min   Anion gap 11 5 - 15    Comment: Performed at [facility], [address] [place], [place] [address]  Magnesium     Status: Abnormal   Collection Time:  09/02/18  5:15 AM  Result Value Ref Range   Magnesium 1.5 (L) 1.7 - 2.4 mg/dL    Comment: Performed at [facility], [address] [place], [place] [address]  Phosphorus     Status: None   Collection Time: 09/02/18  5:15 AM  Result Value Ref Range   Phosphorus 3.2 2.5 - 4.6 mg/dL    Comment: Performed at [facility], [address] [place], [place] [address]  Glucose, capillary     Status: Abnormal   Collection Time: 09/02/18  8:03 AM  Result Value Ref Range   Glucose-Capillary 134 (H) 70 - 99 mg/dL   Comment 1 Notify RN    Comment 2 Document in Chart     Current Facility-Administered Medications  Medication Dose Route Frequency Provider Last Rate Last Dose  . acetaminophen (TYLENOL) tablet 650 mg  650 mg Oral Q6H PRN Tukov-Yual, Magdalene S, NP   650 mg at 09/02/18 0624  . benzonatate (TESSALON) capsule 100 mg  100 mg Oral TID PRN Tukov-Yual, Magdalene S, NP   100 mg at 09/02/18 0637  . diltiazem (CARDIZEM CD) 24 hr capsule 120 mg  120 mg Oral Daily Seals, Angela H, NP      . enoxaparin (LOVENOX) injection 40 mg  40 mg Subcutaneous Q24H Seals, Angela H, NP   40 mg at 09/02/18 0306  . folic acid (FOLVITE) tablet 1 mg  1 mg Oral Daily Seals, Milas Kocher, NP      . folic acid (FOLVITE) tablet 1 mg  1 mg Oral Daily Aleskerov, Fuad, MD      . hydrOXYzine (ATARAX/VISTARIL) tablet 25 mg  25 mg Oral Q6H PRN Seals, Milas Kocher, NP      .  insulin aspart (novoLOG) injection 0-15 Units  0-15 Units Subcutaneous TID WC Seals, Angela H, NP      . insulin aspart (novoLOG) injection 0-5 Units  0-5 Units Subcutaneous QHS Seals, Angela H, NP      . loperamide (IMODIUM) capsule 2-4 mg  2-4 mg Oral PRN Seals, Milas Kocher, NP      . LORazepam (ATIVAN) injection 2-3 mg  2-3 mg Intravenous Q1H PRN Vida Rigger, MD      . metFORMIN (GLUCOPHAGE) tablet 500 mg  500 mg Oral Q breakfast Seals, Milas Kocher, NP      . multivitamin with minerals tablet 1 tablet  1 tablet Oral Daily Seals, Milas Kocher, NP       . multivitamin with minerals tablet 1 tablet  1 tablet Oral Daily Aleskerov, Fuad, MD      . nicotine (NICODERM CQ - dosed in mg/24 hours) patch 14 mg  14 mg Transdermal Daily Seals, Angela H, NP      . ondansetron (ZOFRAN-ODT) disintegrating tablet 4 mg  4 mg Oral Q6H PRN Seals, Angela H, NP      . thiamine (VITAMIN B-1) tablet 100 mg  100 mg Oral Daily Seals, Milas Kocher, NP      . Melene Muller ON 09/03/2018] thiamine (VITAMIN B-1) tablet 100 mg  100 mg Oral Daily Seals, Angela H, NP      . thiamine (VITAMIN B-1) tablet 100 mg  100 mg Oral Daily Vida Rigger, MD      . traZODone (DESYREL) tablet 100 mg  100 mg Oral QHS Seals, Angela H, NP   100 mg at 09/02/18 4098    Musculoskeletal: Strength & Muscle Tone: within normal limits Gait & Station: normal Patient leans: N/A  Psychiatric Specialty Exam: Physical Exam  Nursing note and vitals reviewed. Constitutional: He is oriented to person, place, and time. He appears well-developed and well-nourished. He appears distressed.  HENT:  Head: Normocephalic and atraumatic.  Eyes: EOM are normal.  Neck: Normal range of motion.  Cardiovascular: Normal rate and regular rhythm.  Respiratory: Effort normal. No respiratory distress.  Musculoskeletal: Normal range of motion.  Neurological: He is alert and oriented to person, place, and time.  Psychiatric: His speech is normal. Judgment normal. His affect is blunt. He is slowed. Cognition and memory are normal. He expresses no homicidal and no suicidal ideation.    Review of Systems  Constitutional: Negative.   HENT: Negative.   Respiratory: Negative.   Cardiovascular: Negative.   Gastrointestinal: Negative.   Genitourinary: Negative.   Musculoskeletal: Positive for back pain and myalgias.  Neurological: Positive for tremors and weakness.  Psychiatric/Behavioral: Positive for substance abuse. Negative for depression, hallucinations, memory loss and suicidal ideas. The patient is nervous/anxious  (withdrawal effect). The patient does not have insomnia.     Blood pressure (!) 160/80, pulse (!) 110, temperature 98.8 F (37.1 C), resp. rate 19, height  (1.803 m), weight 83.4 kg, SpO2 97 %.Body mass index is 25.64 kg/m.  General Appearance: Disheveled  Eye Contact:  Minimal  Speech:  Slow  Volume:  Decreased  Mood:  Anxious and Dysphoric  Affect:  Congruent  Thought Process:  Goal Directed and Descriptions of Associations: Intact  Orientation:  Full (Time, Place, and Person)  Thought Content:  Logical  Suicidal Thoughts:  No  Homicidal Thoughts:  No  Memory:  Immediate;   Fair Recent;   Fair Remote;   Fair  Judgement:  Fair  Insight:  Fair  Psychomotor Activity:  Tremor  Concentration:  Concentration: Fair  Recall:  [employer]Fair  Fund of Knowledge:  Fair  Language:  Fair  Akathisia:  No  Handed:  Right  AIMS (if indicated):     Assets:  Communication Skills Housing Social Support  ADL's:  Intact  Cognition:  WNL  Sleep:   adequate    Treatment Plan Summary: Daily contact with patient to assess and evaluate symptoms and progress in treatment and Medication management  Placed orders for Librium taper for alcohol detoxification.  Disposition: Patient does not meet criteria for psychiatric inpatient admission. Supportive therapy provided about ongoing stressors. Refer to IOP. Discussed crisis plan, support from social network, calling 911, coming to the Emergency Department, and calling Suicide Hotline. Patient would like to reconsider residential reahabilitation services.  Will discuss further after stepdown out of intensive care unit.  Plan for discharge patient when medically stable and at decreased risk of withdrawal seizures/delirium tremens.  Mariel CraftSHEILA M Adriene Padula, MD 09/02/2018 10:11 AM

## 2018-09-02 NOTE — Progress Notes (Addendum)
Pt arrived to room 250 from CCU.  Pt oriented to room.  Floor mats positioned for safety.  Pt assessed, and reports pain 8/10.  Offered Tylenol, but pt refused stating it was not strong enough earlier.  MD contacted and new order obtained for oxycodone.  CIWA pt with noticeable tremors, anxiety and mild confusion.  Denies auditory and visual hallucinations.

## 2018-09-02 NOTE — H&P (Signed)
[facility] [place] at [facility]   PATIENT NAME: Kurt Horton    MR#:  [id number]  DATE OF BIRTH:  1960/05/09  DATE OF ADMISSION:  09/01/2018  PRIMARY CARE PHYSICIAN: [name], MD   REQUESTING/REFERRING PHYSICIAN: Sharman Cheek, MD  CHIEF COMPLAINT:   Chief Complaint  Patient presents with   Alcohol Intoxication    HISTORY OF PRESENT ILLNESS:  Kurt Horton  is a 58 y.o. male with a known history of EtOH abuse, hypertension, diabetes mellitus type 2, myasthenia gravis, as well as a history of paroxysmal atrial fibrillation.  He presented to the emergency room reporting tremors and alcohol withdrawal.  Patient was seen earlier in the day around 5 AM for similar complaint and discharged home at that time.  He is complaining of shortness of breath with a 2-day history of nonproductive cough.  He denies having been aware of fevers or chills.  He denies having had nausea, vomiting, diarrhea.  No abdominal pain.  Ethanol level on arrival is 325.  He states he usually drinks 20 two ounce bottles of alcohol a day.  His last reported drink was yesterday 09/01/2018.  He continues to smoke about 1 pack/day with a 40-pack-year history of tobacco abuse.  On arrival, his magnesium is 1.4.  His CTA chest demonstrated right lower lobe groundglass opacity suspicious for viral pneumonia.  However, patient was negative for COVID-19 when tested yesterday.  No evidence of leukocytosis on labs.  He was started on Rocephin and azithromycin in the emergency room.  He has been admitted to the hospitalist service for further management. PAST MEDICAL HISTORY:   Past Medical History:  Diagnosis Date   A-fib [facility])    Alcohol abuse    Alcohol withdrawal (HCC) 11/12/2016   Diabetes mellitus without complication (HCC)    DJD (degenerative joint disease)    Hypertension    Myasthenia gravis (HCC)    Renal disorder     PAST SURGICAL HISTORY:   Past Surgical History:    Procedure Laterality Date   AMPUTATION TOE Left 12/29/2015   Procedure: AMPUTATION TOE;  Surgeon: Gwyneth Revels, DPM;  Location: [facility] ORS;  Service: Podiatry;  Laterality: Left;   ESOPHAGOGASTRODUODENOSCOPY N/A 02/05/2018   Procedure: ESOPHAGOGASTRODUODENOSCOPY (EGD);  Surgeon: Pasty Spillers, MD;  Location: [facility] ENDOSCOPY;  Service: Endoscopy;  Laterality: N/A;   PERIPHERAL VASCULAR CATHETERIZATION N/A 12/27/2015   Procedure: Lower Extremity Angiography;  Surgeon: Annice Needy, MD;  Location: [facility] INVASIVE CV LAB;  Service: Cardiovascular;  Laterality: N/A;   right hip surgery     right shoulder surgery      SOCIAL HISTORY:   Social History   Tobacco Use   Smoking status: Current Every Day Smoker    Packs/day: 0.50    Types: Cigarettes   Smokeless tobacco: Never Used  Substance Use Topics   Alcohol use: Yes    Alcohol/week: 20.0 - 30.0 standard drinks    Types: 20 - 30 Shots of liquor per week    Comment: Last drink 3 days ago 06/22/2018- rum    FAMILY HISTORY:   Family History  Problem Relation Age of Onset   Rheum arthritis Father     DRUG ALLERGIES:   Allergies  Allergen Reactions   Nsaids Other (See Comments)    Duodenal ulcers, GI bleeding   Other Other (See Comments)    Patient states he can't take any "mycin" medications because myasthenia gravis.    REVIEW OF SYSTEMS:   ROS As  per history of present illness. All pertinent systems were reviewed above. Constitutional,  HEENT, cardiovascular, respiratory, GI, GU, musculoskeletal, neuro, psychiatric, endocrine,  integumentary and hematologic systems were reviewed and are otherwise  negative/unremarkable except for positive findings mentioned above in the HPI.   MEDICATIONS AT HOME:   Prior to Admission medications   Medication Sig Start Date End Date Taking? Authorizing Provider  diltiazem (CARDIZEM CD) 120 MG 24 hr capsule Take 1 capsule (120 mg total) by mouth daily. 06/29/18  Yes Altamese Dilling, MD  folic acid (FOLVITE) 1 MG tablet Take 1 tablet (1 mg total) by mouth daily for 30 days. 08/29/18 09/28/18 Yes Pyreddy, Vivien Rota, MD  LORazepam (ATIVAN) 1 MG tablet Take 1 tablet (1 mg total) by mouth every 8 (eight) hours as needed for up to 5 days for anxiety. 08/28/18 09/02/18 Yes Pyreddy, Vivien Rota, MD  metFORMIN (GLUCOPHAGE) 500 MG tablet Take 1 tablet (500 mg total) by mouth daily. 05/30/18  Yes Pucilowska, Jolanta B, MD  metoprolol succinate (TOPROL-XL) 50 MG 24 hr tablet Take 1 tablet (50 mg total) by mouth daily. Take with or immediately following a meal. 05/30/18  Yes Pucilowska, Jolanta B, MD  nicotine (NICODERM CQ - DOSED IN MG/24 HOURS) 14 mg/24hr patch Place 1 patch (14 mg total) onto the skin daily. 08/29/18  Yes Pyreddy, Vivien Rota, MD  senna-docusate (SENOKOT-S) 8.6-50 MG tablet Take 1 tablet by mouth at bedtime as needed for mild constipation. 12/25/17  Yes Mayo, Allyn Kenner, MD  testosterone cypionate (DEPOTESTOSTERONE CYPIONATE) 200 MG/ML injection Inject 200 mg into the muscle every 14 (fourteen) days.   Yes [provider]  thiamine 100 MG tablet Take 1 tablet (100 mg total) by mouth daily for 30 days. 08/29/18 09/28/18 Yes Pyreddy, Vivien Rota, MD  traZODone (DESYREL) 100 MG tablet Take 1 tablet (100 mg total) by mouth at bedtime. 05/30/18  Yes Pucilowska, Jolanta B, MD  folic acid (FOLVITE) 1 MG tablet Take 1 tablet (1 mg total) by mouth daily. Patient not taking: Reported on 09/02/2018 06/29/18   Altamese Dilling, MD      VITAL SIGNS:  Blood pressure (!) 129/96, pulse 98, temperature 98.9 F (37.2 C), temperature source Axillary, resp. rate 18, height  (1.803 m), weight 93 kg, SpO2 100 %.  PHYSICAL EXAMINATION:  Physical Exam Constitutional:      Appearance: Normal appearance.  HENT:     Head: Normocephalic.     Nose: Nose normal.     Mouth/Throat:     Mouth: Mucous membranes are moist.     Pharynx: Oropharynx is clear.  Eyes:     General: No scleral  icterus.    Conjunctiva/sclera: Conjunctivae normal.     Pupils: Pupils are equal, round, and reactive to light.  Neck:     Musculoskeletal: Normal range of motion and neck supple.  Cardiovascular:     Rate and Rhythm: Normal rate and regular rhythm.     Pulses: Normal pulses.     Heart sounds: Normal heart sounds. No murmur.  Pulmonary:     Effort: Pulmonary effort is normal.     Breath sounds: Wheezing, rhonchi and rales present.  Chest:     Chest wall: No tenderness.  Abdominal:     General: Abdomen is flat. Bowel sounds are normal. There is no distension.     Palpations: Abdomen is soft. There is no mass.     Tenderness: There is no abdominal tenderness.  Musculoskeletal: Normal range of motion.  General: No swelling.     Right lower leg: No edema.     Left lower leg: No edema.  Skin:    General: Skin is warm and dry.     Capillary Refill: Capillary refill takes less than 2 seconds.     Coloration: Skin is not jaundiced.     Findings: No rash.  Neurological:     Mental Status: He is alert and oriented to person, place, and time.     GENERAL:  58 y.o.-year-old patient lying in the bed with no acute distress.  EYES: Pupils equal, round, reactive to light and accommodation. No scleral icterus. Extraocular muscles intact.  HEENT: Head atraumatic, normocephalic. Oropharynx and nasopharynx clear.  NECK:  Supple, no jugular venous distention. No thyroid enlargement, no tenderness.  LUNGS: Normal breath sounds bilaterally, no wheezing, rales,rhonchi or crepitation. No use of accessory muscles of respiration.  CARDIOVASCULAR: Regular rate and rhythm, S1, S2 normal. No murmurs, rubs, or gallops.  ABDOMEN: Soft, nondistended, nontender. Bowel sounds present. No organomegaly or mass.  EXTREMITIES: No pedal edema, cyanosis, or clubbing.  NEUROLOGIC: Cranial nerves II through XII are intact. Muscle strength 5/5 in all extremities. Sensation intact. Gait not checked. Tremor of  upper extrremities PSYCHIATRIC: The patient is alert and oriented x 3. Upper body tremors, nervousness SKIN: No obvious rash, lesion, or ulcer.   LABORATORY PANEL:   CBC Recent Labs  Lab 09/01/18 2205  WBC 4.9  HGB 12.8*  HCT 40.3  PLT 326   ------------------------------------------------------------------------------------------------------------------  Chemistries  Recent Labs  Lab 09/01/18 2205  NA 141  K 3.4*  CL 105  CO2 25  GLUCOSE 136*  BUN 7  CREATININE 0.81  CALCIUM 8.0*  AST 37  ALT 29  ALKPHOS 89  BILITOT 0.5   ------------------------------------------------------------------------------------------------------------------  Cardiac Enzymes No results for input(s): TROPONINI in the last 168 hours. ------------------------------------------------------------------------------------------------------------------  RADIOLOGY:  Ct Angio Chest Pe W And/or Wo Contrast  Result Date: 09/02/2018 CLINICAL DATA:  58 year old male with shortness of breath. Negative for COVID-19 on 09/01/2018. EXAM: CT ANGIOGRAPHY CHEST WITH CONTRAST TECHNIQUE: Multidetector CT imaging of the chest was performed using the standard protocol during bolus administration of intravenous contrast. Multiplanar CT image reconstructions and MIPs were obtained to evaluate the vascular anatomy. CONTRAST:  75mL OMNIPAQUE IOHEXOL 350 MG/ML SOLN COMPARISON:  Portable chest 09/01/2018 and earlier. Chest CT 01/19/2018. FINDINGS: Cardiovascular: Good contrast bolus timing in the pulmonary arterial tree. Mild respiratory motion. No focal filling defect identified in the pulmonary arteries to suggest acute pulmonary embolism. No calcified coronary artery atherosclerosis is evident. No cardiomegaly or pericardial effusion. Negative visible aorta. Mediastinum/Nodes: Negative. Lungs/Pleura: Major airways are patent. Lower lung volumes. Multifocal round and peripheral patchy ground-glass opacity in the right  lower lobe (series 12, image 56). Subtle areas of ground-glass opacity elsewhere in the right lung. Mostly dependent ground-glass opacity in the left lower lobe more resembles atelectasis. No pleural effusion or consolidation. Upper Abdomen: Negative visible liver aside from possible a patent steatosis. Negative visible gallbladder, spleen, pancreas, adrenal glands and bowel. Stable visible kidneys. Musculoskeletal: Occasional chronic rib fractures. Mild scoliosis. Advanced lower cervical and cervicothoracic junction spine degeneration. No acute osseous abnormality identified. Review of the MIP images confirms the above findings. IMPRESSION: 1. No evidence of acute pulmonary embolus. 2. Lower lung volumes with multifocal ground-glass opacity in the right lower lobe suspicious for acute viral/atypical respiratory infection - note this patient tested negative for COVID-19 yesterday. No pleural effusion. Electronically Signed   By: HRexene Edison  Margo Aye M.D.   On: 09/02/2018 00:13   Dg Chest Portable 1 View  Result Date: 09/01/2018 CLINICAL DATA:  Cough and hypoxia EXAM: PORTABLE CHEST 1 VIEW COMPARISON:  09/01/2018, 08/12/2018, 06/30/2018 FINDINGS: Mild bronchitic changes at the bases. Stable cardiomediastinal silhouette. No focal airspace disease or effusion. No pneumothorax. IMPRESSION: No active disease. Electronically Signed   By: Jasmine Pang M.D.   On: 09/01/2018 22:05   Dg Chest Port 1 View  Result Date: 09/01/2018 CLINICAL DATA:  Fever EXAM: PORTABLE CHEST 1 VIEW COMPARISON:  08/12/2018 FINDINGS: Cardiomegaly. Bibasilar atelectasis. No effusions or edema. No acute bony abnormality. IMPRESSION: Cardiomegaly.  Bibasilar atelectasis. Electronically Signed   By: Charlett Nose M.D.   On: 09/01/2018 03:04      IMPRESSION AND PLAN:   1.  Alcohol withdrawal syndrome - Initial dose Ativan given in the emergency room.  Will continue PRN - Continue thiamine and folic acid - Librium as needed for withdrawal  symptoms - Will monitor per CIWA protocol  2.  Community-acquired pneumonia - Initial azithromycin and Rocephin given in the emergency room.  We will continue azithromycin and Rocephin therapy - DuoNeb every 6 as needed for shortness of breath or cough -Sputum culture pending - He has oxygen at 2 L per nasal cannula  3 diabetes mellitus Home medications have been restarted.  Will monitor with Accu-Cheks and treat with sliding scale insulin  4.  History of atrial fibrillation with RVR - Anticoagulant therapy initiated with Lovenox - He is on telemetry monitoring and will be admitted to stepdown for close monitoring and observation  5.  DVT and PPI prophylaxis were initiated.    All the records are reviewed and case discussed with ED provider. The plan of care was discussed in details with the patient (and family). I answered all questions. The patient agreed to proceed with the above mentioned plan. Further management will depend upon hospital course.   CODE STATUS: Full code  TOTAL TIME TAKING CARE OF THIS PATIENT:45 minutes.    Pearletha Alfred M.D on 09/02/2018 at 1:52 AM  Pager - [phone]  After 6pm go to [email or web address] - Social research officer, government  [facility] [place] [facility]  Office  [phone]  CC: Primary care physician; [name], MD   Note: This dictation was prepared with Dragon dictation along with smaller phrase technology. Any transcriptional errors that result from this process are unintentional.

## 2018-09-02 NOTE — ED Notes (Signed)
Patient requested food and drink. Patient NPO per MD.

## 2018-09-02 NOTE — ED Notes (Signed)
Pt returned from ct. Ice pack applied to right axilla with blankets covering right arm to prevent ice burn. Pt encouraged to keep arm elevated on pillows. No redness noted around RAC, however small amount of swelling approx 2cm in circumfrence noted to RAC. Pt denies pain to area. Pt requesting po fluids, per MD NPO at this time. Pt advised of NPO status.

## 2018-09-02 NOTE — ED Notes (Signed)
Warm blankets provided, iv infiltration site unchanged. Pt declines to leave arm elevated.

## 2018-09-02 NOTE — Progress Notes (Signed)
eLink Physician-Brief Progress Note Patient Name: Kurt Horton DOB: [birth year] MRN: [id number]   Date of Service  09/02/2018  HPI/Events of Note  58 yo male with PMH of ETOH abuse. Frequent admissions for ETOH detox. Admitted for Detox with DT's. PCCM asked to assume care. VSS.  eICU Interventions  No new orders.      Intervention Category Evaluation Type: New Patient Evaluation  Lenell Antu 09/02/2018, 2:48 AM

## 2018-09-02 NOTE — Progress Notes (Signed)
[employer] - [place] at [facility]   PATIENT NAME: Kurt Horton    MR#:  [id number]  DATE OF BIRTH:  01/25/1961  SUBJECTIVE:  CHIEF COMPLAINT:   Chief Complaint  Patient presents with  . Alcohol Intoxication   Came with alcohol intoxication and noted to have some infiltrate on x-ray. REVIEW OF SYSTEMS:  CONSTITUTIONAL: No fever, fatigue or weakness.  EYES: No blurred or double vision.  EARS, NOSE, AND THROAT: No tinnitus or ear pain.  RESPIRATORY: No cough, shortness of breath, wheezing or hemoptysis.  CARDIOVASCULAR: No chest pain, orthopnea, edema.  GASTROINTESTINAL: No nausea, vomiting, diarrhea or abdominal pain.  GENITOURINARY: No dysuria, hematuria.  ENDOCRINE: No polyuria, nocturia,  HEMATOLOGY: No anemia, easy bruising or bleeding SKIN: No rash or lesion. MUSCULOSKELETAL: No joint pain or arthritis.   NEUROLOGIC: No tingling, numbness, weakness.  PSYCHIATRY: No anxiety or depression.   ROS  DRUG ALLERGIES:   Allergies  Allergen Reactions  . Nsaids Other (See Comments)    Duodenal ulcers, GI bleeding  . Other Other (See Comments)    Patient states he can't take any "mycin" medications because myasthenia gravis.    VITALS:  Blood pressure (!) 166/92, pulse 96, temperature 98.9 F (37.2 C), temperature source Oral, resp. rate 18, height 5\' 11"  (1.803 m), weight 83.4 kg, SpO2 99 %.  PHYSICAL EXAMINATION:  GENERAL:  58 y.o.-year-old patient lying in the bed with no acute distress.  EYES: Pupils equal, round, reactive to light and accommodation. No scleral icterus. Extraocular muscles intact.  HEENT: Head atraumatic, normocephalic. Oropharynx and nasopharynx clear.  NECK:  Supple, no jugular venous distention. No thyroid enlargement, no tenderness.  LUNGS: Normal breath sounds bilaterally, no wheezing, some basal right side crepitation. No use of accessory muscles of respiration.  CARDIOVASCULAR: S1, S2 normal. No murmurs, rubs, or gallops.   ABDOMEN: Soft, nontender, nondistended. Bowel sounds present. No organomegaly or mass.  EXTREMITIES: No pedal edema, cyanosis, or clubbing.  NEUROLOGIC: Cranial nerves II through XII are intact. Muscle strength 5/5 in all extremities. Sensation intact. Gait not checked.  Have some shaking. PSYCHIATRIC: The patient is alert and oriented x 3.  Appears anxious. SKIN: No obvious rash, lesion, or ulcer.   Physical Exam LABORATORY PANEL:   CBC Recent Labs  Lab 09/01/18 2205  WBC 4.9  HGB 12.8*  HCT 40.3  PLT 326   ------------------------------------------------------------------------------------------------------------------  Chemistries  Recent Labs  Lab 09/02/18 0515  NA 140  K 3.2*  CL 103  CO2 26  GLUCOSE 157*  BUN 6  CREATININE 0.69  CALCIUM 7.9*  MG 1.5*  AST 31  ALT 24  ALKPHOS 73  BILITOT 0.4   ------------------------------------------------------------------------------------------------------------------  Cardiac Enzymes No results for input(s): TROPONINI in the last 168 hours. ------------------------------------------------------------------------------------------------------------------  RADIOLOGY:  Ct Angio Chest Pe W And/or Wo Contrast  Result Date: 09/02/2018 CLINICAL DATA:  58 year old male with shortness of breath. Negative for COVID-19 on 09/01/2018. EXAM: CT ANGIOGRAPHY CHEST WITH CONTRAST TECHNIQUE: Multidetector CT imaging of the chest was performed using the standard protocol during bolus administration of intravenous contrast. Multiplanar CT image reconstructions and MIPs were obtained to evaluate the vascular anatomy. CONTRAST:  70mL OMNIPAQUE IOHEXOL 350 MG/ML SOLN COMPARISON:  Portable chest 09/01/2018 and earlier. Chest CT 01/19/2018. FINDINGS: Cardiovascular: Good contrast bolus timing in the pulmonary arterial tree. Mild respiratory motion. No focal filling defect identified in the pulmonary arteries to suggest acute pulmonary embolism.  No calcified coronary artery atherosclerosis is evident. No cardiomegaly or pericardial effusion.  Negative visible aorta. Mediastinum/Nodes: Negative. Lungs/Pleura: Major airways are patent. Lower lung volumes. Multifocal round and peripheral patchy ground-glass opacity in the right lower lobe (series 12, image 56). Subtle areas of ground-glass opacity elsewhere in the right lung. Mostly dependent ground-glass opacity in the left lower lobe more resembles atelectasis. No pleural effusion or consolidation. Upper Abdomen: Negative visible liver aside from possible a patent steatosis. Negative visible gallbladder, spleen, pancreas, adrenal glands and bowel. Stable visible kidneys. Musculoskeletal: Occasional chronic rib fractures. Mild scoliosis. Advanced lower cervical and cervicothoracic junction spine degeneration. No acute osseous abnormality identified. Review of the MIP images confirms the above findings. IMPRESSION: 1. No evidence of acute pulmonary embolus. 2. Lower lung volumes with multifocal ground-glass opacity in the right lower lobe suspicious for acute viral/atypical respiratory infection - note this patient tested negative for COVID-19 yesterday. No pleural effusion. Electronically Signed   By: Odessa FlemingH  Hall M.D.   On: 09/02/2018 00:13   Dg Chest Portable 1 View  Result Date: 09/01/2018 CLINICAL DATA:  Cough and hypoxia EXAM: PORTABLE CHEST 1 VIEW COMPARISON:  09/01/2018, 08/12/2018, 06/30/2018 FINDINGS: Mild bronchitic changes at the bases. Stable cardiomediastinal silhouette. No focal airspace disease or effusion. No pneumothorax. IMPRESSION: No active disease. Electronically Signed   By: Jasmine PangKim  Fujinaga M.D.   On: 09/01/2018 22:05   Dg Chest Port 1 View  Result Date: 09/01/2018 CLINICAL DATA:  Fever EXAM: PORTABLE CHEST 1 VIEW COMPARISON:  08/12/2018 FINDINGS: Cardiomegaly. Bibasilar atelectasis. No effusions or edema. No acute bony abnormality. IMPRESSION: Cardiomegaly.  Bibasilar atelectasis.  Electronically Signed   By: Charlett NoseKevin  Dover M.D.   On: 09/01/2018 03:04    ASSESSMENT AND PLAN:   Principal Problem:   Alcohol intoxication (HCC) Active Problems:   Chronic low back pain   EtOH dependence (HCC)   Substance induced mood disorder (HCC)   Alcohol use disorder, severe, dependence (HCC)  1.  Alcohol withdrawal syndrome - Continue thiamine and folic acid - Librium as needed for withdrawal symptoms - Will monitor per CIWA protocol  2.  Community-acquired pneumonia-may be aspiration due to chronic alcoholism. - Initial azithromycin and Rocephin given in the emergency room.  Changed to Augmentin.  - DuoNeb every 6 as needed for shortness of breath or cough -Sputum culture pending - Was on oxygen but now on room air.  3 diabetes mellitus Home medications have been restarted.  Will monitor with Accu-Cheks and treat with sliding scale insulin  4.  History of atrial fibrillation with RVR - Anticoagulant therapy initiated with Lovenox - He is on telemetry monitoring and will be admitted to stepdown for close monitoring and observation  5.  DVT and PPI prophylaxis were initiated.   All the records are reviewed and case discussed with Care Management/Social Workerr. Management plans discussed with the patient, family and they are in agreement.  CODE STATUS: Full code.  TOTAL TIME TAKING CARE OF THIS PATIENT: 35 minutes.    POSSIBLE D/C IN 1-2 DAYS, DEPENDING ON CLINICAL CONDITION.   Altamese DillingVaibhavkumar Exavior Kimmons M.D on 09/02/2018   Between 7am to 6pm - Pager - [phone]  After 6pm go to [email or web address] - password [email or web address] [email or web address]  [facility] [place] [facility]  Office  [phone]  CC: Primary care physician; Barbette ReichmannHande, Vishwanath, MD  Note: This dictation was prepared with Dragon dictation along with smaller phrase technology. Any transcriptional errors that result from this process are unintentional.

## 2018-09-02 NOTE — ED Notes (Signed)
D51/2NS continues to infuse.

## 2018-09-02 NOTE — ED Notes (Signed)
hospitalist in to see pt.

## 2018-09-02 NOTE — Progress Notes (Signed)
Patient transferred to 250 via bed.

## 2018-09-02 NOTE — ED Notes (Signed)
Daniel RN, aware of bed assigned 

## 2018-09-02 NOTE — Progress Notes (Addendum)
Late note 1305 Patient in SVT rate of 146. Asymptomatic. B/P 168/85, alert and oriented.Marland Kitchen Consulted with Dr. Karna Christmas consulted. Given Metoprolol 5mg  IVP. Heart rate decreased to 100.

## 2018-09-02 NOTE — Consult Note (Signed)
CRITICAL CARE NOTE      CHIEF COMPLAINT   Alcohol overdose with lethargy   HPI   Kurt Horton is a 58 y.o. male with a past medical history of alcohol abuse, diabetes, hypertension, brought in via EMS to ED for alcohol overdose.  EMS personal found the patient on the floor at the house where he lives with family.  Patient with slurred speech consistent with alcohol intoxication.  Patient found to be febrile by EMS.  Patient denies any shortness of breath, cough, chest or abdominal pain, vomiting or diarrhea, dysuria. Patient also has hx of recurrent falls, severe diabetic neuropathy.  On  Admission to MICU he is arousable but confused.  He is unable to get out of bed due to weakness, reports he has been drinking chronically and had several episodes of ETOH withdrawal in recent past at home. Patient had small volume of vomitus on neck suggestive of likely recurrent aspiration while inebrieated.    PAST MEDICAL HISTORY   Past Medical History:  Diagnosis Date   A-fib [facility])    Alcohol abuse    Alcohol withdrawal (HCC) 11/12/2016   Diabetes mellitus without complication (HCC)    DJD (degenerative joint disease)    Hypertension    Myasthenia gravis (HCC)    Renal disorder      SURGICAL HISTORY   Past Surgical History:  Procedure Laterality Date   AMPUTATION TOE Left 12/29/2015   Procedure: AMPUTATION TOE;  Surgeon: Gwyneth Revels, DPM;  Location: [facility] ORS;  Service: Podiatry;  Laterality: Left;   ESOPHAGOGASTRODUODENOSCOPY N/A 02/05/2018   Procedure: ESOPHAGOGASTRODUODENOSCOPY (EGD);  Surgeon: Pasty Spillers, MD;  Location: [facility] ENDOSCOPY;  Service: Endoscopy;  Laterality: N/A;   PERIPHERAL VASCULAR CATHETERIZATION N/A 12/27/2015   Procedure: Lower Extremity Angiography;  Surgeon: Annice Needy,  MD;  Location: [facility] INVASIVE CV LAB;  Service: Cardiovascular;  Laterality: N/A;   right hip surgery     right shoulder surgery       FAMILY HISTORY   Family History  Problem Relation Age of Onset   Rheum arthritis Father      SOCIAL HISTORY   Social History   Tobacco Use   Smoking status: Current Every Day Smoker    Packs/day: 0.50    Types: Cigarettes   Smokeless tobacco: Never Used  Substance Use Topics   Alcohol use: Yes    Alcohol/week: 20.0 - 30.0 standard drinks    Types: 20 - 30 Shots of liquor per week    Comment: Last drink 3 days ago 06/22/2018- rum   Drug use: Yes    Types: Oxycodone     MEDICATIONS   Current Medication:  Current Facility-Administered Medications:    acetaminophen (TYLENOL) tablet 650 mg, 650 mg, Oral, Q6H PRN, Tukov-Yual, Magdalene S, NP, 650 mg at 09/02/18 0786   benzonatate (TESSALON) capsule 100 mg, 100 mg, Oral, TID PRN, Tukov-Yual, Magdalene S, NP, 100 mg at 09/02/18 7544   chlordiazePOXIDE (LIBRIUM) capsule 25 mg, 25 mg, Oral, Q6H PRN, Seals, Angela H, NP, 25 mg at 09/02/18 0326   diltiazem (CARDIZEM CD) 24 hr capsule 120 mg, 120 mg, Oral, Daily, Seals, Angela H, NP   enoxaparin (LOVENOX) injection 40 mg, 40 mg, Subcutaneous, Q24H, Seals, Angela H, NP, 40 mg at 09/02/18 9201   folic acid (FOLVITE) tablet 1 mg, 1 mg, Oral, Daily, Seals, Angela H, NP   hydrOXYzine (ATARAX/VISTARIL) tablet 25 mg, 25 mg, Oral, Q6H PRN, Seals, Milas Kocher, NP   insulin aspart (  novoLOG) injection 0-15 Units, 0-15 Units, Subcutaneous, TID WC, Seals, Angela H, NP   insulin aspart (novoLOG) injection 0-5 Units, 0-5 Units, Subcutaneous, QHS, Seals, Angela H, NP   loperamide (IMODIUM) capsule 2-4 mg, 2-4 mg, Oral, PRN, Seals, Milas KocherAngela H, NP   metFORMIN (GLUCOPHAGE) tablet 500 mg, 500 mg, Oral, Q breakfast, Seals, Milas KocherAngela H, NP   multivitamin with minerals tablet 1 tablet, 1 tablet, Oral, Daily, Seals, Milas KocherAngela H, NP   nicotine (NICODERM CQ - dosed  in mg/24 hours) patch 14 mg, 14 mg, Transdermal, Daily, Seals, Angela H, NP   ondansetron (ZOFRAN-ODT) disintegrating tablet 4 mg, 4 mg, Oral, Q6H PRN, Seals, Angela H, NP   thiamine (VITAMIN B-1) tablet 100 mg, 100 mg, Oral, Daily, Seals, Angela H, NP   [START ON 09/03/2018] thiamine (VITAMIN B-1) tablet 100 mg, 100 mg, Oral, Daily, Seals, Angela H, NP   traZODone (DESYREL) tablet 100 mg, 100 mg, Oral, QHS, Seals, Angela H, NP, 100 mg at 09/02/18 0326    ALLERGIES   Nsaids and Other    REVIEW OF SYSTEMS    Unable to obtain due to confused sensorium  PHYSICAL EXAMINATION   Vitals:   09/02/18 0200 09/02/18 0300  BP: 136/80   Pulse: 93   Resp: 17   Temp:  97.6 F (36.4 C)  SpO2: 97%     GENERAL:NAD confused HEAD: Normocephalic, atraumatic.  EYES: Pupils equal, round, reactive to light.  No scleral icterus.  MOUTH: Moist mucosal membrane. NECK: Supple. No thyromegaly. No nodules. No JVD.  PULMONARY: Rhonchi bilaterally  CARDIOVASCULAR: S1 and S2. Regular rate and rhythm. No murmurs, rubs, or gallops.  GASTROINTESTINAL: Soft, nontender, non-distended. No masses. Positive bowel sounds. No hepatosplenomegaly.  MUSCULOSKELETAL: No swelling, clubbing, or edema.  NEUROLOGIC: Mild distress due to acute illness SKIN:intact,warm,dry   LABS AND IMAGING     -I personally reviewed most recent blood work, imaging and microbiology - significant findings today are hypokalemia, hyperglycemia  LAB RESULTS: Recent Labs  Lab 09/01/18 0237 09/01/18 2205 09/02/18 0515  NA 143 141 140  K 3.3* 3.4* 3.2*  CL 106 105 103  CO2 25 25 26   BUN 7 7 6   CREATININE 0.85 0.81 0.69  GLUCOSE 131* 136* 157*   Recent Labs  Lab 08/30/18 2239 09/01/18 0237 09/01/18 2205  HGB 14.8 12.9* 12.8*  HCT 46.0 40.6 40.3  WBC 4.9 4.8 4.9  PLT 337 340 326     IMAGING RESULTS: Ct Angio Chest Pe W And/or Wo Contrast  Result Date: 09/02/2018 CLINICAL DATA:  58 year old male with shortness of  breath. Negative for COVID-19 on 09/01/2018. EXAM: CT ANGIOGRAPHY CHEST WITH CONTRAST TECHNIQUE: Multidetector CT imaging of the chest was performed using the standard protocol during bolus administration of intravenous contrast. Multiplanar CT image reconstructions and MIPs were obtained to evaluate the vascular anatomy. CONTRAST:  75mL OMNIPAQUE IOHEXOL 350 MG/ML SOLN COMPARISON:  Portable chest 09/01/2018 and earlier. Chest CT 01/19/2018. FINDINGS: Cardiovascular: Good contrast bolus timing in the pulmonary arterial tree. Mild respiratory motion. No focal filling defect identified in the pulmonary arteries to suggest acute pulmonary embolism. No calcified coronary artery atherosclerosis is evident. No cardiomegaly or pericardial effusion. Negative visible aorta. Mediastinum/Nodes: Negative. Lungs/Pleura: Major airways are patent. Lower lung volumes. Multifocal round and peripheral patchy ground-glass opacity in the right lower lobe (series 12, image 56). Subtle areas of ground-glass opacity elsewhere in the right lung. Mostly dependent ground-glass opacity in the left lower lobe more resembles atelectasis. No pleural effusion or consolidation.  Upper Abdomen: Negative visible liver aside from possible a patent steatosis. Negative visible gallbladder, spleen, pancreas, adrenal glands and bowel. Stable visible kidneys. Musculoskeletal: Occasional chronic rib fractures. Mild scoliosis. Advanced lower cervical and cervicothoracic junction spine degeneration. No acute osseous abnormality identified. Review of the MIP images confirms the above findings. IMPRESSION: 1. No evidence of acute pulmonary embolus. 2. Lower lung volumes with multifocal ground-glass opacity in the right lower lobe suspicious for acute viral/atypical respiratory infection - note this patient tested negative for COVID-19 yesterday. No pleural effusion. Electronically Signed   By: Odessa Fleming M.D.   On: 09/02/2018 00:13   Dg Chest Portable 1  View  Result Date: 09/01/2018 CLINICAL DATA:  Cough and hypoxia EXAM: PORTABLE CHEST 1 VIEW COMPARISON:  09/01/2018, 08/12/2018, 06/30/2018 FINDINGS: Mild bronchitic changes at the bases. Stable cardiomediastinal silhouette. No focal airspace disease or effusion. No pneumothorax. IMPRESSION: No active disease. Electronically Signed   By: Jasmine Pang M.D.   On: 09/01/2018 22:05       ASSESSMENT AND PLAN    -Multidisciplinary rounds held today  Acute Alcohol overdose    -Initially obtunded now with confusion but sensorium improved     - s/p repletion of thiamine/folate with banana bag    - electrolyte repletion - K, Mg, phos    -monitoring for re-feeding syndrome        Right lower lobe focal infiltrate     - likely related to aspiration pneumonitis     - normal PCT and no leukocytosis      - s/p empiric antibiotic treatment for CAP         CARDIAC FAILURE-   Hx AF  -c/w cardizem and toprol xl -oxygen as needed -follow up cardiac enzymes as indicated ICU monitoring    ID -continue IV abx as prescibed -follow up cultures  GI/Nutrition GI PROPHYLAXIS as indicated DIET-->TF's as tolerated Constipation protocol as indicated  ENDO - ICU hypoglycemic\Hyperglycemia protocol -check FSBS per protocol   ELECTROLYTES -follow labs as needed -replace as needed -pharmacy consultation   DVT/GI PRX ordered -SCDs  TRANSFUSIONS AS NEEDED MONITOR FSBS ASSESS the need for LABS as needed   Critical care provider statement:    Critical care time (minutes):  36   Critical care time was exclusive of:  Separately billable procedures and treating other patients   Critical care was necessary to treat or prevent imminent or life-threatening deterioration of the following conditions:  Altered mental status with lethargy, acute alcohol overdose, AF, DM, neuropathy, aspiration pnemonitis, multiple comorbid conditions.    Critical care was time spent personally by me on the  following activities:  Development of treatment plan with patient or surrogate, discussions with consultants, evaluation of patient's response to treatment, examination of patient, obtaining history from patient or surrogate, ordering and performing treatments and interventions, ordering and review of laboratory studies and re-evaluation of patient's condition.  I assumed direction of critical care for this patient from another provider in my specialty: no    This document was prepared using Dragon voice recognition software and may include unintentional dictation errors.    Vida Rigger, M.D.  Division of Pulmonary & Critical Care Medicine  [facility]

## 2018-09-03 DIAGNOSIS — F10929 Alcohol use, unspecified with intoxication, unspecified: Secondary | ICD-10-CM

## 2018-09-03 DIAGNOSIS — F1020 Alcohol dependence, uncomplicated: Secondary | ICD-10-CM

## 2018-09-03 DIAGNOSIS — F1994 Other psychoactive substance use, unspecified with psychoactive substance-induced mood disorder: Secondary | ICD-10-CM

## 2018-09-03 DIAGNOSIS — F10280 Alcohol dependence with alcohol-induced anxiety disorder: Secondary | ICD-10-CM

## 2018-09-03 LAB — URINE CULTURE
Culture: NO GROWTH
Special Requests: NORMAL

## 2018-09-03 LAB — MAGNESIUM: Magnesium: 2.1 mg/dL (ref 1.7–2.4)

## 2018-09-03 LAB — GLUCOSE, CAPILLARY
Glucose-Capillary: 123 mg/dL — ABNORMAL HIGH (ref 70–99)
Glucose-Capillary: 97 mg/dL (ref 70–99)
Glucose-Capillary: 116 mg/dL — ABNORMAL HIGH (ref 70–99)
Glucose-Capillary: 102 mg/dL — ABNORMAL HIGH (ref 70–99)

## 2018-09-03 LAB — BASIC METABOLIC PANEL
Anion gap: 9 (ref 5–15)
BUN: 6 mg/dL (ref 6–20)
CO2: 25 mmol/L (ref 22–32)
Calcium: 8.7 mg/dL — ABNORMAL LOW (ref 8.9–10.3)
Chloride: 104 mmol/L (ref 98–111)
Creatinine, Ser: 0.74 mg/dL (ref 0.61–1.24)
GFR calc Af Amer: >60 mL/min (ref >60)
GFR calc non Af Amer: >60 mL/min (ref >60)
Glucose, Bld: 122 mg/dL — ABNORMAL HIGH (ref 70–99)
Potassium: 3.4 mmol/L — ABNORMAL LOW (ref 3.5–5.1)
Sodium: 138 mmol/L (ref 135–145)

## 2018-09-03 LAB — PROCALCITONIN: Procalcitonin: <0.1 ng/mL

## 2018-09-03 LAB — PHOSPHORUS: Phosphorus: 3.5 mg/dL (ref 2.5–4.6)

## 2018-09-03 MED ORDER — OXYCODONE-ACETAMINOPHEN 5-325 MG PO TABS
1.0000 | ORAL_TABLET | Freq: Four times a day (QID) | ORAL | Status: DC | PRN
  Administered 2018-09-03 – 2018-09-07 (×14): 2 via ORAL
  Filled 2018-09-03 (×14): qty 2

## 2018-09-03 MED ORDER — POTASSIUM CHLORIDE CRYS ER 20 MEQ PO TBCR
40.0000 meq | EXTENDED_RELEASE_TABLET | Freq: Two times a day (BID) | ORAL | Status: AC
  Administered 2018-09-03 (×2): 40 meq via ORAL
  Filled 2018-09-03 (×2): qty 2

## 2018-09-03 NOTE — Progress Notes (Signed)
[employer] [facility] [place] [facility]   PATIENT NAME: Kurt Horton    MR#:  [id number]  DATE OF BIRTH:  [birth year]  SUBJECTIVE:  CHIEF COMPLAINT:   Chief Complaint  Patient presents with  . Alcohol Intoxication   Came with alcohol intoxication and noted to have some infiltrate on x-ray. No complaint of chest pain due to repeated coughing.  Asking for stronger pain medicines as he has chronic back pain and he was used to have oxycodone every day at home. REVIEW OF SYSTEMS:  CONSTITUTIONAL: No fever, fatigue or weakness.  EYES: No blurred or double vision.  EARS, NOSE, AND THROAT: No tinnitus or ear pain.  RESPIRATORY: No cough, shortness of breath, wheezing or hemoptysis.  CARDIOVASCULAR: No chest pain, orthopnea, edema.  GASTROINTESTINAL: No nausea, vomiting, diarrhea or abdominal pain.  GENITOURINARY: No dysuria, hematuria.  ENDOCRINE: No polyuria, nocturia,  HEMATOLOGY: No anemia, easy bruising or bleeding SKIN: No rash or lesion. MUSCULOSKELETAL: No joint pain or arthritis.   NEUROLOGIC: No tingling, numbness, weakness.  PSYCHIATRY: No anxiety or depression.   ROS  DRUG ALLERGIES:   Allergies  Allergen Reactions  . Nsaids Other (See Comments)    Duodenal ulcers, GI bleeding  . Other Other (See Comments)    Patient states he can't take any "mycin" medications because myasthenia gravis.    VITALS:  Blood pressure (!) 150/89, pulse (!) 103, temperature 99 F (37.2 C), temperature source Oral, resp. rate 19, height 5\' 11"  (1.803 m), weight 85.2 kg, SpO2 94 %.  PHYSICAL EXAMINATION:  GENERAL:  58 y.o.-year-old patient lying in the bed with no acute distress.  EYES: Pupils equal, round, reactive to light and accommodation. No scleral icterus. Extraocular muscles intact.  HEENT: Head atraumatic, normocephalic. Oropharynx and nasopharynx clear.  NECK:  Supple, no jugular venous distention. No thyroid enlargement, no tenderness.  LUNGS: Normal breath sounds  bilaterally, no wheezing, some basal right side crepitation. No use of accessory muscles of respiration.  CARDIOVASCULAR: S1, S2 normal. No murmurs, rubs, or gallops.  ABDOMEN: Soft, nontender, nondistended. Bowel sounds present. No organomegaly or mass.  EXTREMITIES: No pedal edema, cyanosis, or clubbing.  NEUROLOGIC: Cranial nerves II through XII are intact. Muscle strength 4/5 in all extremities. Sensation intact. Gait not checked.  Have some shaking. PSYCHIATRIC: The patient is alert and oriented x 3.  Appears anxious. SKIN: No obvious rash, lesion, or ulcer.   Physical Exam LABORATORY PANEL:   CBC Recent Labs  Lab 09/01/18 2205  WBC 4.9  HGB 12.8*  HCT 40.3  PLT 326   ------------------------------------------------------------------------------------------------------------------  Chemistries  Recent Labs  Lab 09/02/18 0515 09/03/18 0256  NA 140 138  K 3.2* 3.4*  CL 103 104  CO2 26 25  GLUCOSE 157* 122*  BUN 6 6  CREATININE 0.69 0.74  CALCIUM 7.9* 8.7*  MG 1.5* 2.1  AST 31  --   ALT 24  --   ALKPHOS 73  --   BILITOT 0.4  --    ------------------------------------------------------------------------------------------------------------------  Cardiac Enzymes No results for input(s): TROPONINI in the last 168 hours. ------------------------------------------------------------------------------------------------------------------  RADIOLOGY:  Ct Angio Chest Pe W And/or Wo Contrast  Result Date: 09/02/2018 CLINICAL DATA:  58 year old male with shortness of breath. Negative for COVID-19 on 09/01/2018. EXAM: CT ANGIOGRAPHY CHEST WITH CONTRAST TECHNIQUE: Multidetector CT imaging of the chest was performed using the standard protocol during bolus administration of intravenous contrast. Multiplanar CT image reconstructions and MIPs were obtained to evaluate the vascular anatomy. CONTRAST:  38mL  OMNIPAQUE IOHEXOL 350 MG/ML SOLN COMPARISON:  Portable chest 09/01/2018 and  earlier. Chest CT 01/19/2018. FINDINGS: Cardiovascular: Good contrast bolus timing in the pulmonary arterial tree. Mild respiratory motion. No focal filling defect identified in the pulmonary arteries to suggest acute pulmonary embolism. No calcified coronary artery atherosclerosis is evident. No cardiomegaly or pericardial effusion. Negative visible aorta. Mediastinum/Nodes: Negative. Lungs/Pleura: Major airways are patent. Lower lung volumes. Multifocal round and peripheral patchy ground-glass opacity in the right lower lobe (series 12, image 56). Subtle areas of ground-glass opacity elsewhere in the right lung. Mostly dependent ground-glass opacity in the left lower lobe more resembles atelectasis. No pleural effusion or consolidation. Upper Abdomen: Negative visible liver aside from possible a patent steatosis. Negative visible gallbladder, spleen, pancreas, adrenal glands and bowel. Stable visible kidneys. Musculoskeletal: Occasional chronic rib fractures. Mild scoliosis. Advanced lower cervical and cervicothoracic junction spine degeneration. No acute osseous abnormality identified. Review of the MIP images confirms the above findings. IMPRESSION: 1. No evidence of acute pulmonary embolus. 2. Lower lung volumes with multifocal ground-glass opacity in the right lower lobe suspicious for acute viral/atypical respiratory infection - note this patient tested negative for COVID-19 yesterday. No pleural effusion. Electronically Signed   By: Odessa Fleming M.D.   On: 09/02/2018 00:13   Dg Chest Portable 1 View  Result Date: 09/01/2018 CLINICAL DATA:  Cough and hypoxia EXAM: PORTABLE CHEST 1 VIEW COMPARISON:  09/01/2018, 08/12/2018, 06/30/2018 FINDINGS: Mild bronchitic changes at the bases. Stable cardiomediastinal silhouette. No focal airspace disease or effusion. No pneumothorax. IMPRESSION: No active disease. Electronically Signed   By: Jasmine Pang M.D.   On: 09/01/2018 22:05    ASSESSMENT AND PLAN:   Principal  Problem:   Alcohol intoxication (HCC) Active Problems:   Chronic low back pain   EtOH dependence (HCC)   Substance induced mood disorder (HCC)   Alcohol use disorder, severe, dependence (HCC)  1.  Alcohol withdrawal syndrome - Continue thiamine and folic acid - Librium as needed for withdrawal symptoms - Will monitor per CIWA protocol -Appreciated psychiatric help who started on Librium tapering dose over the next few days.  2.  Community-acquired pneumonia-may be aspiration due to chronic alcoholism. - Initial azithromycin and Rocephin given in the emergency room.  Changed to Augmentin.  - DuoNeb every 6 as needed for shortness of breath or cough -Sputum culture pending - Was on oxygen but now on room air.  3 diabetes mellitus Home medications have been restarted.  Will monitor with Accu-Cheks and treat with sliding scale insulin  4.  History of atrial fibrillation with RVR - Anticoagulant therapy initiated with Lovenox - He is on telemetry monitoring and will be admitted to stepdown for close monitoring and observation  5.  DVT and PPI prophylaxis were initiated.  6.  Chest pain due to repeated coughing and chronic back pain. Increased oxycodone dose to 10 mg every 6 hours as needed.  All the records are reviewed and case discussed with Care Management/Social Workerr. Management plans discussed with the patient, family and they are in agreement.  CODE STATUS: Full code.  TOTAL TIME TAKING CARE OF THIS PATIENT: 35 minutes.    POSSIBLE D/C IN 1-2 DAYS, DEPENDING ON CLINICAL CONDITION.   Altamese Dilling M.D on 09/03/2018   Between 7am to 6pm - Pager - [phone]  After 6pm go to [email or web address] - password [email or web address]  [facility]  Office  [phone]  CC: Primary care physician; [name], MD  Note: This dictation was prepared with  Dragon dictation along with smaller Lobbyistphrase technology. Any transcriptional errors that result from  this process are unintentional.

## 2018-09-03 NOTE — Consult Note (Signed)
PHARMACY CONSULT NOTE - FOLLOW UP  Pharmacy Consult for Electrolyte Monitoring and Replacement   Recent Labs: Potassium (mmol/L)  Date Value  09/03/2018 3.4 (L)   Magnesium (mg/dL)  Date Value  92/03/9416 2.1   Calcium (mg/dL)  Date Value  40/81/4481 8.7 (L)   Albumin (g/dL)  Date Value  85/63/1497 3.1 (L)  08/24/2016 4.2   Phosphorus (mg/dL)  Date Value  02/63/7858 3.5   Sodium (mmol/L)  Date Value  09/03/2018 138     Assessment: Patient admitted for acute alcohol overdose/withdrawal and CAP. Patient also has history of afib with RVR.   Goal of Therapy:  Potassium ~4.0 and Magnesium ~2.0  Plan:  --replacing potassium with oral KCl 40 mEq BID x2 --Because this consult was initiated in [facility] Pharmacy will sign off for now  Lowella Bandy, PharmD 09/03/2018 7:59 AM

## 2018-09-03 NOTE — Consult Note (Signed)
[facility] Face-to-Face Psychiatry Consult   Reason for Consult: Follow-up consult for this patient with alcohol abuse and delirium tremens Referring Physician: Dr. Manson Passey Patient Identification: Kurt Horton MRN:  [id number] Principal Diagnosis: Alcohol intoxication (HCC) Diagnosis:  Principal Problem:   Alcohol intoxication (HCC) Active Problems:   Chronic low back pain   EtOH dependence (HCC)   Substance induced mood disorder (HCC)   Alcohol use disorder, severe, dependence (HCC)   Total Time spent with patient: 15 minutes  Subjective: "I need help with long-term placement for substance use treatment."  HPI:   Kurt Horton is a 59 y.o. male  with a known history of EtOH abuse, hypertension, diabetes mellitus type 2, myasthenia gravis, as well as a history of paroxysmal atrial fibrillation.  He presented to the emergency room reporting tremors and alcohol withdrawal.  Patient was seen earlier in the day around 5 AM for similar complaint and discharged home at that time.  He is complaining of shortness of breath with a 2-day history of nonproductive cough.  He denies having been aware of fevers or chills.  He denies having had nausea, vomiting, diarrhea.  No abdominal pain.  Ethanol level on arrival is 325.  He states he usually drinks 20 two ounce bottles of alcohol a day.  His last reported drink was yesterday 09/01/2018.  He continues to smoke about 1 pack/day with a 40-pack-year history of tobacco abuse. On arrival, his magnesium is 1.4.  His CTA chest demonstrated right lower lobe groundglass opacity suspicious for viral pneumonia.  However, patient was negative for COVID-19 when tested yesterday.  No evidence of leukocytosis on labs.  He was started on Rocephin and azithromycin in the emergency room.  Admitted to ICU after EMS personal found the patient on the floor at the house where he lives with family. Patient with slurred speech consistent with alcohol intoxication. Patient found  to be febrile by EMS. Patient denies any shortness of breath, cough, chest or abdominal pain, vomiting or diarrhea, dysuria. Patient also has hx of recurrent falls, severe diabetic neuropathy.  On Admission to MICU he is arousable but confused.  He is unable to get out of bed due to weakness, reports he has been drinking chronically and had several episodes of ETOH withdrawal in recent past at home. Patient had small volume of vomitus on neck suggestive of likely recurrent aspiration while inebriated  Psychiatry consult is requested for concerns of alcohol abuse.  Patient is seen and chart is reviewed.    He had an inpatient psychiatric admission for detox and suicidal ideation from May 25, 2018 - May 30, 2018.  Patient reports his longest period of sobriety is 5 days. He has had 12 presentations to the ED for alcohol intoxication since that time, with 4 episodes requiring medical admission.    During evaluation Kurt Horton is alert and orientated x 4: calm and cooperative; and mood congruent with affect. He does not appear to be responding to internal/ external stimuli or delusional thoughts. Patient denies suicidal/ self-harm/ homicidal ideation, psychosis, and paranoia. Patient answered all questions appropriately.  He denies SI, HI, AVH.  Patient does report anxiety and withdrawal tremors, for which she is requesting medications for detox while hospitalized. Patient is hoping for long-term placement for substance use treatment at discharge.  Past Psychiatric History: Depression but mainly a long history of alcohol abuse without any recent ability to stay sober meanwhile with his physical compromise getting worse  Risk to Self:  No Risk  to Others:  No Prior Inpatient Therapy:  Yes Prior Outpatient Therapy:  Yes, last at [facility] January 18 through May 30, 2018  Past Medical History:  Past Medical History:  Diagnosis Date  . A-fib (HCC)   . Alcohol abuse    . Alcohol withdrawal (HCC) 11/12/2016  . Diabetes mellitus without complication (HCC)   . DJD (degenerative joint disease)   . Hypertension   . Myasthenia gravis (HCC)   . Renal disorder     Past Surgical History:  Procedure Laterality Date  . AMPUTATION TOE Left 12/29/2015   Procedure: AMPUTATION TOE;  Surgeon: Gwyneth Revels, DPM;  Location: [facility] ORS;  Service: Podiatry;  Laterality: Left;  . ESOPHAGOGASTRODUODENOSCOPY N/A 02/05/2018   Procedure: ESOPHAGOGASTRODUODENOSCOPY (EGD);  Surgeon: Pasty Spillers, MD;  Location: [facility] ENDOSCOPY;  Service: Endoscopy;  Laterality: N/A;  . PERIPHERAL VASCULAR CATHETERIZATION N/A 12/27/2015   Procedure: Lower Extremity Angiography;  Surgeon: Annice Needy, MD;  Location: [facility] INVASIVE CV LAB;  Service: Cardiovascular;  Laterality: N/A;  . right hip surgery    . right shoulder surgery     Family History:  Family History  Problem Relation Age of Onset  . Rheum arthritis Father    Family Psychiatric  History: None  Social History:  Social History   Substance and Sexual Activity  Alcohol Use Yes  . Alcohol/week: 20.0 - 30.0 standard drinks  . Types: 20 - 30 Shots of liquor per week   Comment: Last drink 3 days ago 06/22/2018- rum     Social History   Substance and Sexual Activity  Drug Use Yes  . Types: Oxycodone    Social History   Socioeconomic History  . Marital status: Divorced    Spouse name: Not on file  . Number of children: Not on file  . Years of education: Not on file  . Highest education level: Not on file  Occupational History  . Not on file  Social Needs  . Financial resource strain: Not on file  . Food insecurity:    Worry: Not on file    Inability: Not on file  . Transportation needs:    Medical: Not on file    Non-medical: Not on file  Tobacco Use  . Smoking status: Current Every Day Smoker    Packs/day: 0.50    Types: Cigarettes  . Smokeless tobacco: Never Used  Substance and Sexual Activity  .  Alcohol use: Yes    Alcohol/week: 20.0 - 30.0 standard drinks    Types: 20 - 30 Shots of liquor per week    Comment: Last drink 3 days ago 06/22/2018- rum  . Drug use: Yes    Types: Oxycodone  . Sexual activity: Not on file  Lifestyle  . Physical activity:    Days per week: Not on file    Minutes per session: Not on file  . Stress: Not on file  Relationships  . Social connections:    Talks on phone: Not on file    Gets together: Not on file    Attends religious service: Not on file    Active member of club or organization: Not on file    Attends meetings of clubs or organizations: Not on file    Relationship status: Not on file  Other Topics Concern  . Not on file  Social History Narrative   Live with Parents, ambulates with a cane. Falls lately.   Additional Social History: Lives with parents: Father and mother are  51 years old, father has rheumatoid arthritis and mother has cardiac condition. Currently unemployed, on disability Patient reports that he does save most of his disability check, but spends at least $500 of each on alcohol.  He states that his parents have discussed taking his money from him, but have not done so to this point. Patient has 2 siblings with whom he has little contact with.    Allergies:   Allergies  Allergen Reactions  . Nsaids Other (See Comments)    Duodenal ulcers, GI bleeding  . Other Other (See Comments)    Patient states he can't take any "mycin" medications because myasthenia gravis.    Labs:  Results for orders placed or performed during the hospital encounter of 09/01/18 (from the past 48 hour(s))  Blood gas, venous     Status: Abnormal (Preliminary result)   Collection Time: 09/01/18  9:43 PM  Result Value Ref Range   FIO2 PENDING    pH, Ven 7.33 7.250 - 7.430   pCO2, Ven 53 44.0 - 60.0 mmHg   pO2, Ven 65.0 (H) 32.0 - 45.0 mmHg   Bicarbonate 27.9 20.0 - 28.0 mmol/L   Acid-Base Excess 1.0 0.0 - 2.0 mmol/L   O2 Saturation 90.8 %    Patient temperature 37.0    Collection site VENOUS    Sample type VENOUS     Comment: Performed at [facility], [address] [place], [place] [address]   Mechanical Rate PENDING   Comprehensive metabolic panel     Status: Abnormal   Collection Time: 09/01/18 10:05 PM  Result Value Ref Range   Sodium 141 135 - 145 mmol/L   Potassium 3.4 (L) 3.5 - 5.1 mmol/L   Chloride 105 98 - 111 mmol/L   CO2 25 22 - 32 mmol/L   Glucose, Bld 136 (H) 70 - 99 mg/dL   BUN 7 6 - 20 mg/dL   Creatinine, Ser 8.32 0.61 - 1.24 mg/dL   Calcium 8.0 (L) 8.9 - 10.3 mg/dL   Total Protein 6.7 6.5 - 8.1 g/dL   Albumin 3.7 3.5 - 5.0 g/dL   AST 37 15 - 41 U/L   ALT 29 0 - 44 U/L   Alkaline Phosphatase 89 38 - 126 U/L   Total Bilirubin 0.5 0.3 - 1.2 mg/dL   GFR calc non Af Amer >60 >60 mL/min   GFR calc Af Amer >60 >60 mL/min   Anion gap 11 5 - 15    Comment: Performed at [facility], [address] [place], [place] [address]  Lipase, blood     Status: Abnormal   Collection Time: 09/01/18 10:05 PM  Result Value Ref Range   Lipase 61 (H) 11 - 51 U/L    Comment: Performed at [facility], [address] [place], [place] [address]  CBC with Differential     Status: Abnormal   Collection Time: 09/01/18 10:05 PM  Result Value Ref Range   WBC 4.9 4.0 - 10.5 K/uL   RBC 4.41 4.22 - 5.81 MIL/uL   Hemoglobin 12.8 (L) 13.0 - 17.0 g/dL   HCT 30.9 40.7 - 68.0 %   MCV 91.4 80.0 - 100.0 fL   MCH 29.0 26.0 - 34.0 pg   MCHC 31.8 30.0 - 36.0 g/dL   RDW 88.1 (H) 10.3 - 15.9 %   Platelets 326 150 - 400 K/uL   nRBC 0.0 0.0 - 0.2 %   Neutrophils Relative % 50 %   Neutro Abs  2.5 1.7 - 7.7 K/uL   Lymphocytes Relative 36 %   Lymphs Abs 1.8 0.7 - 4.0 K/uL   Monocytes Relative 9 %   Monocytes Absolute 0.4 0.1 - 1.0 K/uL   Eosinophils Relative 1 %   Eosinophils Absolute 0.0 0.0 - 0.5 K/uL   Basophils Relative 3 %   Basophils Absolute 0.2 (H) 0.0 - 0.1 K/uL   Immature Granulocytes 1 %    Abs Immature Granulocytes 0.03 0.00 - 0.07 K/uL    Comment: Performed at [facility], [address] [place], [place] [address]  Hemoglobin A1c     Status: Abnormal   Collection Time: 09/01/18 10:05 PM  Result Value Ref Range   Hgb A1c MFr Bld 5.9 (H) 4.8 - 5.6 %    Comment: (NOTE) Pre diabetes:          5.7%-6.4% Diabetes:              >6.4% Glycemic control for   <7.0% adults with diabetes    Mean Plasma Glucose 122.63 mg/dL    Comment: Performed at [facility], [address] [place], [place] [address]  Acetaminophen level     Status: Abnormal   Collection Time: 09/01/18 11:45 PM  Result Value Ref Range   Acetaminophen (Tylenol), Serum <10 (L) 10 - 30 ug/mL    Comment: (NOTE) Therapeutic concentrations vary significantly. A range of 10-30 ug/mL  may be an effective concentration for many patients. However, some  are best treated at concentrations outside of this range. Acetaminophen concentrations >150 ug/mL at 4 hours after ingestion  and >50 ug/mL at 12 hours after ingestion are often associated with  toxic reactions. Performed at [facility], [address] [place], [place] [address]   Ethanol     Status: Abnormal   Collection Time: 09/01/18 11:45 PM  Result Value Ref Range   Alcohol, Ethyl (B) 325 (HH) <10 mg/dL    Comment: CRITICAL RESULT CALLED TO, READ BACK BY AND VERIFIED WITH DAN LEWIS RN 09/02/2018 @ 0016 RDW (NOTE) Lowest detectable limit for serum alcohol is 10 mg/dL. For medical purposes only. Performed at [facility], [address] [place], [place] [address]   Salicylate level     Status: None   Collection Time: 09/01/18 11:45 PM  Result Value Ref Range   Salicylate Lvl <7.0 2.8 - 30.0 mg/dL    Comment: Performed at [facility], [address] [place], [place] [address]  Glucose, capillary     Status: Abnormal   Collection Time: 09/02/18  2:46 AM  Result Value Ref Range   Glucose-Capillary 126  (H) 70 - 99 mg/dL  MRSA PCR Screening     Status: None   Collection Time: 09/02/18  3:01 AM  Result Value Ref Range   MRSA by PCR NEGATIVE NEGATIVE    Comment:        The GeneXpert MRSA Assay (FDA approved for NASAL specimens only), is one component of a comprehensive MRSA colonization surveillance program. It is not intended to diagnose MRSA infection nor to guide or monitor treatment for MRSA infections. Performed at [facility], [address] [place], [place] [address]   Procalcitonin - Baseline     Status: None   Collection Time: 09/02/18  4:21 AM  Result Value Ref Range   Procalcitonin <0.10 ng/mL    Comment:        Interpretation: PCT (Procalcitonin) <= 0.5 ng/mL: Systemic infection (sepsis) is not likely.  Local bacterial infection is possible. (NOTE)       Sepsis PCT Algorithm           Lower Respiratory Tract                                      Infection PCT Algorithm    ----------------------------     ----------------------------         PCT < 0.25 ng/mL                PCT < 0.10 ng/mL         Strongly encourage             Strongly discourage   discontinuation of antibiotics    initiation of antibiotics    ----------------------------     -----------------------------       PCT 0.25 - 0.50 ng/mL            PCT 0.10 - 0.25 ng/mL               OR       >80% decrease in PCT            Discourage initiation of                                            antibiotics      Encourage discontinuation           of antibiotics    ----------------------------     -----------------------------         PCT >= 0.50 ng/mL              PCT 0.26 - 0.50 ng/mL               AND        <80% decrease in PCT             Encourage initiation of                                             antibiotics       Encourage continuation           of antibiotics    ----------------------------     -----------------------------        PCT >= 0.50 ng/mL                  PCT >  0.50 ng/mL               AND         increase in PCT                  Strongly encourage                                      initiation of antibiotics    Strongly encourage escalation           of antibiotics                                     -----------------------------  PCT <= 0.25 ng/mL                                                 OR                                        > 80% decrease in PCT                                     Discontinue / Do not initiate                                             antibiotics Performed at [facility], [address] [place], [place] [address]   Comprehensive metabolic panel     Status: Abnormal   Collection Time: 09/02/18  5:15 AM  Result Value Ref Range   Sodium 140 135 - 145 mmol/L   Potassium 3.2 (L) 3.5 - 5.1 mmol/L   Chloride 103 98 - 111 mmol/L   CO2 26 22 - 32 mmol/L   Glucose, Bld 157 (H) 70 - 99 mg/dL   BUN 6 6 - 20 mg/dL   Creatinine, Ser 6.04 0.61 - 1.24 mg/dL   Calcium 7.9 (L) 8.9 - 10.3 mg/dL   Total Protein 5.7 (L) 6.5 - 8.1 g/dL   Albumin 3.1 (L) 3.5 - 5.0 g/dL   AST 31 15 - 41 U/L   ALT 24 0 - 44 U/L   Alkaline Phosphatase 73 38 - 126 U/L   Total Bilirubin 0.4 0.3 - 1.2 mg/dL   GFR calc non Af Amer >60 >60 mL/min   GFR calc Af Amer >60 >60 mL/min   Anion gap 11 5 - 15    Comment: Performed at [facility], [address] [place], [place] [address]  Magnesium     Status: Abnormal   Collection Time: 09/02/18  5:15 AM  Result Value Ref Range   Magnesium 1.5 (L) 1.7 - 2.4 mg/dL    Comment: Performed at [facility], [address] [place], [place] [address]  Phosphorus     Status: None   Collection Time: 09/02/18  5:15 AM  Result Value Ref Range   Phosphorus 3.2 2.5 - 4.6 mg/dL    Comment: Performed at [facility], [address] [place], [place] [address]  Glucose, capillary     Status: Abnormal   Collection Time: 09/02/18   8:03 AM  Result Value Ref Range   Glucose-Capillary 134 (H) 70 - 99 mg/dL   Comment 1 Notify RN    Comment 2 Document in Chart   Glucose, capillary     Status: Abnormal   Collection Time: 09/02/18  1:02 PM  Result Value Ref Range   Glucose-Capillary 133 (H) 70 - 99 mg/dL  Glucose, capillary     Status: Abnormal   Collection Time: 09/02/18  4:06 PM  Result Value Ref Range   Glucose-Capillary 104 (H) 70 - 99 mg/dL  Glucose, capillary     Status: Abnormal   Collection Time: 09/02/18  4:29  PM  Result Value Ref Range   Glucose-Capillary 110 (H) 70 - 99 mg/dL  Glucose, capillary     Status: Abnormal   Collection Time: 09/02/18 10:00 PM  Result Value Ref Range   Glucose-Capillary 100 (H) 70 - 99 mg/dL  Procalcitonin     Status: None   Collection Time: 09/03/18  2:56 AM  Result Value Ref Range   Procalcitonin <0.10 ng/mL    Comment:        Interpretation: PCT (Procalcitonin) <= 0.5 ng/mL: Systemic infection (sepsis) is not likely. Local bacterial infection is possible. (NOTE)       Sepsis PCT Algorithm           Lower Respiratory Tract                                      Infection PCT Algorithm    ----------------------------     ----------------------------         PCT < 0.25 ng/mL                PCT < 0.10 ng/mL         Strongly encourage             Strongly discourage   discontinuation of antibiotics    initiation of antibiotics    ----------------------------     -----------------------------       PCT 0.25 - 0.50 ng/mL            PCT 0.10 - 0.25 ng/mL               OR       >80% decrease in PCT            Discourage initiation of                                            antibiotics      Encourage discontinuation           of antibiotics    ----------------------------     -----------------------------         PCT >= 0.50 ng/mL              PCT 0.26 - 0.50 ng/mL               AND        <80% decrease in PCT             Encourage initiation of                                              antibiotics       Encourage continuation           of antibiotics    ----------------------------     -----------------------------        PCT >= 0.50 ng/mL                  PCT > 0.50 ng/mL               AND         increase in PCT  Strongly encourage                                      initiation of antibiotics    Strongly encourage escalation           of antibiotics                                     -----------------------------                                           PCT <= 0.25 ng/mL                                                 OR                                        > 80% decrease in PCT                                     Discontinue / Do not initiate                                             antibiotics Performed at [facility], [address] [place], [place] [address]   Basic metabolic panel     Status: Abnormal   Collection Time: 09/03/18  2:56 AM  Result Value Ref Range   Sodium 138 135 - 145 mmol/L   Potassium 3.4 (L) 3.5 - 5.1 mmol/L   Chloride 104 98 - 111 mmol/L   CO2 25 22 - 32 mmol/L   Glucose, Bld 122 (H) 70 - 99 mg/dL   BUN 6 6 - 20 mg/dL   Creatinine, Ser 6.57 0.61 - 1.24 mg/dL   Calcium 8.7 (L) 8.9 - 10.3 mg/dL   GFR calc non Af Amer >60 >60 mL/min   GFR calc Af Amer >60 >60 mL/min   Anion gap 9 5 - 15    Comment: Performed at [facility], [address], [place], [place] [address]  Magnesium     Status: None   Collection Time: 09/03/18  2:56 AM  Result Value Ref Range   Magnesium 2.1 1.7 - 2.4 mg/dL    Comment: Performed at [facility], [address], [place], [place] [address]  Phosphorus     Status: None   Collection Time: 09/03/18  2:56 AM  Result Value Ref Range   Phosphorus 3.5 2.5 - 4.6 mg/dL    Comment: Performed at [facility], [address] [place], [place] [address]  Glucose, capillary     Status: Abnormal   Collection Time: 09/03/18  8:05 AM   Result Value Ref Range   Glucose-Capillary 123 (H) 70 - 99 mg/dL   Comment 1 Notify RN  Comment 2 Document in Chart     Current Facility-Administered Medications  Medication Dose Route Frequency Provider Last Rate Last Dose  . acetaminophen (TYLENOL) tablet 650 mg  650 mg Oral Q6H PRN Tukov-Yual, Magdalene S, NP   650 mg at 09/02/18 0624  . amoxicillin-clavulanate (AUGMENTIN) 875-125 MG per tablet 1 tablet  1 tablet Oral Q12H Altamese Dilling, MD   1 tablet at 09/03/18 0834  . benzonatate (TESSALON) capsule 100 mg  100 mg Oral TID PRN Tukov-Yual, Magdalene S, NP   100 mg at 09/03/18 0634  . chlordiazePOXIDE (LIBRIUM) capsule 25 mg  25 mg Oral Q6H PRN Mariel Craft, MD   25 mg at 09/02/18 1449  . chlordiazePOXIDE (LIBRIUM) capsule 25 mg  25 mg Oral TID Mariel Craft, MD       Followed by  . [START ON 09/04/2018] chlordiazePOXIDE (LIBRIUM) capsule 25 mg  25 mg Oral BH-qamhs Mariel Craft, MD       Followed by  . [START ON 09/05/2018] chlordiazePOXIDE (LIBRIUM) capsule 25 mg  25 mg Oral Daily Mariel Craft, MD      . diltiazem [facility]) 24 hr capsule 120 mg  120 mg Oral Daily Seals, Marylene Land H, NP   120 mg at 09/03/18 1610  . enoxaparin (LOVENOX) injection 40 mg  40 mg Subcutaneous Q24H Seals, Marylene Land H, NP   40 mg at 09/03/18 0610  . folic acid (FOLVITE) tablet 1 mg  1 mg Oral Daily Seals, Angela H, NP   1 mg at 09/03/18 0834  . hydrOXYzine (ATARAX/VISTARIL) tablet 25 mg  25 mg Oral Q6H PRN Janeann Merl H, NP   25 mg at 09/02/18 2206  . insulin aspart (novoLOG) injection 0-15 Units  0-15 Units Subcutaneous TID WC Pearletha Alfred, NP   2 Units at 09/03/18 [phone]  . insulin aspart (novoLOG) injection 0-5 Units  0-5 Units Subcutaneous QHS Seals, Angela H, NP      . loperamide (IMODIUM) capsule 2-4 mg  2-4 mg Oral PRN Seals, Milas Kocher, NP      . LORazepam (ATIVAN) tablet 2 mg  2 mg Oral Q4H PRN Altamese Dilling, MD   2 mg at 09/03/18 5409  . magnesium sulfate IVPB 2 g 50  mL  2 g Intravenous Once Maxcine Ham M, RPH      . metFORMIN (GLUCOPHAGE) tablet 500 mg  500 mg Oral Q breakfast Seals, Marylene Land H, NP   500 mg at 09/03/18 8119  . multivitamin with minerals tablet 1 tablet  1 tablet Oral Daily Seals, Milas Kocher, NP   1 tablet at 09/03/18 0834  . nicotine (NICODERM CQ - dosed in mg/24 hours) patch 14 mg  14 mg Transdermal Daily Seals, Angela H, NP   14 mg at 09/03/18 0834  . ondansetron (ZOFRAN-ODT) disintegrating tablet 4 mg  4 mg Oral Q6H PRN Seals, Marylene Land H, NP      . oxyCODONE-acetaminophen (PERCOCET/ROXICET) 5-325 MG per tablet 1 tablet  1 tablet Oral Q6H PRN Altamese Dilling, MD   1 tablet at 09/03/18 [phone]  . potassium chloride (KLOR-CON) packet 40 mEq  40 mEq Oral Once Mauri Reading, [facility]      . potassium chloride SA (K-DUR) CR tablet 40 mEq  40 mEq Oral BID Lowella Bandy, RPH   40 mEq at 09/03/18 2956  . thiamine (VITAMIN B-1) tablet 100 mg  100 mg Oral Daily Seals, Marylene Land H, NP   100 mg at 09/03/18 2130  .  traZODone (DESYREL) tablet 100 mg  100 mg Oral QHS Seals, Angela H, NP   100 mg at 09/02/18 2203    Musculoskeletal: Strength & Muscle Tone: within normal limits Gait & Station: normal Patient leans: N/A  Psychiatric Specialty Exam: Physical Exam  Nursing note and vitals reviewed. Constitutional: He is oriented to person, place, and time. He appears well-developed and well-nourished. He appears distressed.  HENT:  Head: Normocephalic and atraumatic.  Eyes: EOM are normal.  Neck: Normal range of motion.  Cardiovascular: Normal rate and regular rhythm.  Respiratory: Effort normal. No respiratory distress.  Musculoskeletal: Normal range of motion.  Neurological: He is alert and oriented to person, place, and time.  Psychiatric: His speech is normal. Judgment normal. His affect is blunt. He is slowed. Cognition and memory are normal. He expresses no homicidal and no suicidal ideation.    Review of Systems  Constitutional: Negative.    HENT: Negative.   Respiratory: Negative.   Cardiovascular: Negative.   Gastrointestinal: Negative.   Genitourinary: Negative.   Musculoskeletal: Positive for back pain and myalgias.  Neurological: Positive for tremors and weakness.  Psychiatric/Behavioral: Positive for substance abuse. Negative for depression, hallucinations, memory loss and suicidal ideas. The patient is nervous/anxious (withdrawal effect). The patient does not have insomnia.     Blood pressure (!) 150/89, pulse (!) 103, temperature 99 F (37.2 C), temperature source Oral, resp. rate 19, height 5\' 11"  (1.803 m), weight 85.2 kg, SpO2 94 %.Body mass index is 26.2 kg/m.  General Appearance: Disheveled  Eye Contact:  Minimal  Speech:  Clear and Coherent and Normal Rate  Volume:  Normal  Mood:  Dysphoric  Affect:  Congruent  Thought Process:  Goal Directed and Descriptions of Associations: Intact  Orientation:  Full (Time, Place, and Person)  Thought Content:  Logical  Suicidal Thoughts:  No  Homicidal Thoughts:  No  Memory:  Immediate;   Fair Recent;   Fair Remote;   Fair  Judgement:  Fair  Insight:  Fair  Psychomotor Activity:  Tremor  Concentration:  Concentration: Fair  Recall:  [employer] of Knowledge:  Fair  Language:  Fair  Akathisia:  No  Handed:  Right  AIMS (if indicated):     Assets:  Communication Skills Housing Social Support  ADL's:  Intact  Cognition:  WNL  Sleep:   adequate    Treatment Plan Summary: Daily contact with patient to assess and evaluate symptoms and progress in treatment and Medication management  Continue Librium taper for alcohol detoxification.  Disposition: Patient does not meet criteria for psychiatric inpatient admission. Supportive therapy provided about ongoing stressors. Discussed crisis plan, support from social network, calling 911, coming to the Emergency Department, and calling Suicide Hotline. Patient would like to reconsider residential reahabilitation  services.  Appreciate social work support in assisting patient with long-term alcohol use rehabilitation program.  Plan for discharge patient to substance use treatment program when medically stable and at decreased risk of withdrawal seizures/delirium tremens.  Mariel Craft, MD 09/03/2018 10:29 AM

## 2018-09-03 NOTE — Progress Notes (Signed)
Patient still complaining of pain in his lower back and ribs. He requested more pain meds. Dr. Elisabeth Pigeon notifed, verbal orders to change his PRN pain med percocet to 1-2 tablets every 6hrs instead of 1 tablet. Will continue to monitor patient.

## 2018-09-04 LAB — GLUCOSE, CAPILLARY
Glucose-Capillary: 113 mg/dL — ABNORMAL HIGH (ref 70–99)
Glucose-Capillary: 112 mg/dL — ABNORMAL HIGH (ref 70–99)
Glucose-Capillary: 134 mg/dL — ABNORMAL HIGH (ref 70–99)
Glucose-Capillary: 243 mg/dL — ABNORMAL HIGH (ref 70–99)

## 2018-09-04 LAB — BLOOD GAS, VENOUS
Acid-Base Excess: 1 mmol/L (ref 0.0–2.0)
Bicarbonate: 27.9 mmol/L (ref 20.0–28.0)
O2 Saturation: 90.8 %
Patient temperature: 37
pCO2, Ven: 53 mmHg (ref 44.0–60.0)
pH, Ven: 7.33 (ref 7.250–7.430)
pO2, Ven: 65 mmHg — ABNORMAL HIGH (ref 32.0–45.0)

## 2018-09-04 MED ORDER — GUAIFENESIN-DM 100-10 MG/5ML PO SYRP
5.0000 mL | ORAL_SOLUTION | ORAL | Status: DC | PRN
  Administered 2018-09-04 – 2018-09-07 (×5): 5 mL via ORAL
  Filled 2018-09-04 (×5): qty 5

## 2018-09-04 MED ORDER — PROPRANOLOL HCL 10 MG PO TABS
10.0000 mg | ORAL_TABLET | Freq: Three times a day (TID) | ORAL | Status: DC
  Administered 2018-09-04 – 2018-09-07 (×9): 10 mg via ORAL
  Filled 2018-09-04 (×11): qty 1

## 2018-09-04 MED ORDER — IPRATROPIUM-ALBUTEROL 0.5-2.5 (3) MG/3ML IN SOLN
3.0000 mL | RESPIRATORY_TRACT | Status: DC
  Administered 2018-09-04: 3 mL via RESPIRATORY_TRACT
  Filled 2018-09-04: qty 3

## 2018-09-04 MED ORDER — METHYLPREDNISOLONE SODIUM SUCC 125 MG IJ SOLR
60.0000 mg | INTRAMUSCULAR | Status: DC
  Administered 2018-09-04 – 2018-09-06 (×3): 60 mg via INTRAVENOUS
  Filled 2018-09-04 (×3): qty 2

## 2018-09-04 MED ORDER — IPRATROPIUM-ALBUTEROL 0.5-2.5 (3) MG/3ML IN SOLN: 3.0000 mL | Freq: Four times a day (QID) | RESPIRATORY_TRACT | Status: DC | PRN

## 2018-09-04 NOTE — Progress Notes (Signed)
[facility] MD Progress Note  09/04/2018 9:42 AM Kurt Horton  MRN:  [id number] Subjective: "I will need to go home to get my things together before I go to any residential treatment." Principal Problem: Alcohol intoxication (HCC) Diagnosis: Principal Problem:   Alcohol intoxication (HCC) Active Problems:   Chronic low back pain   EtOH dependence (HCC)   Substance induced mood disorder (HCC)   Alcohol use disorder, severe, dependence (HCC)  Total Time spent with patient: 35 minutes   Subjective: "I need help with long-term placement for substance use treatment."  HPI:   Kurt Horton is a 58 y.o. male with a known history of EtOH abuse, hypertension, diabetes mellitus type 2, myasthenia gravis, as well as a history of paroxysmal atrial fibrillation. He presented to the emergency room reporting tremors and alcohol withdrawal. Patient was seen earlier in the day around 5 AM for similar complaint and discharged home at that time. He is complaining of shortness of breath with a 2-day history of nonproductive cough. He denies having been aware of fevers or chills. He denies having had nausea, vomiting, diarrhea. No abdominal pain. Ethanol level on arrival is 325. He states he usually drinks 20 twoounce bottles of alcohol a day. His last reported drink was yesterday 09/01/2018. He continues to smoke about 1 pack/day with a 40-pack-year history of tobacco abuse. On arrival, his magnesium is 1.4. His CTA chest demonstrated right lower lobe groundglass opacity suspicious for viral pneumonia. However, patient was negative for COVID-19 when tested yesterday. No evidence of leukocytosis on labs. He was started on Rocephin and azithromycin in the emergency room.  Admitted to ICU after EMS personalfound the patient on the floor at the house where he lives with family. Patient with slurred speech consistent with alcohol intoxication. Patient found to be febrile by EMS. Patient denies any  shortness of breath, cough, chest or abdominal pain, vomiting or diarrhea, dysuria.Patient also has hx of recurrent falls, severe diabetic neuropathy. On Admission to MICU he is arousable but confused. He is unable to get out of bed due to weakness, reports he has been drinking chronically and had several episodes of ETOH withdrawal in recent past at home. Patient had small volume of vomitus on neck suggestive of likely recurrent aspiration while inebriated  Psychiatry consult is requested for concerns of alcohol abuse and follow-up treatment.  Patient is seen and chart is reviewed.    He had an inpatient psychiatric admission for detox and suicidal ideation from May 25, 2018 - May 30, 2018.  Patient reports his longest period of sobriety is 5 days. He has had 12 presentations to the ED for alcohol intoxication since that time, with 4 episodes requiring medical admission.    During evaluation Kurt Horton is alert and orientated x 4: calm and cooperative; and mood congruent with affect. He does not appear to be responding to internal/ external stimuli or delusional thoughts. Patient denies suicidal/ self-harm/ homicidal ideation, psychosis, and paranoia. Patient answered all questions appropriately.  He denies SI, HI, AVH.  Today, patient reports of "shakes," and requests medication in order to manage these so that he is not tempted to drink alcohol after discharge.  Patient reports that Librium and Valium have been effective in treatment of shakes in the past.  Reviewed with patient effects of benzodiazepines and alcohol and dangers of additive effects of use together.  Offered patient Antabuse, which she does not wish to take for concerns of headache and nausea.  Patient is agreeable  to trial of propranolol 10 mg 3 times daily for tremor.    He states he is aware that he will likely be discharged home and would be left to enroll in long-term residential treatment on his own.  Patient request  resource sheet.  Patient states he, "hopes not to drink" but thinks there is a 50-50 chance he will use alcohol after discharge.  Past Psychiatric History: Depression but mainly a long history of alcohol abuse without any recent ability to stay sober meanwhile with his physical compromise getting worse  Risk to Self:  No Risk to Others:  No Prior Inpatient Therapy:  Yes Prior Outpatient Therapy:  Yes, last at [facility] January 18 through May 30, 2018   Past Medical History:  Past Medical History:  Diagnosis Date  . A-fib (HCC)   . Alcohol abuse   . Alcohol withdrawal (HCC) 11/12/2016  . Diabetes mellitus without complication (HCC)   . DJD (degenerative joint disease)   . Hypertension   . Myasthenia gravis (HCC)   . Renal disorder     Past Surgical History:  Procedure Laterality Date  . AMPUTATION TOE Left 12/29/2015   Procedure: AMPUTATION TOE;  Surgeon: Gwyneth RevelsJustin Fowler, DPM;  Location: [facility] ORS;  Service: Podiatry;  Laterality: Left;  . ESOPHAGOGASTRODUODENOSCOPY N/A 02/05/2018   Procedure: ESOPHAGOGASTRODUODENOSCOPY (EGD);  Surgeon: Pasty Spillersahiliani, Varnita B, MD;  [facility];  Service: Endoscopy;  Laterality: N/A;  . PERIPHERAL VASCULAR CATHETERIZATION N/A 12/27/2015   Procedure: Lower Extremity Angiography;  Surgeon: Annice NeedyJason S Dew, MD;  Location: [facility] INVASIVE CV LAB;  Service: Cardiovascular;  Laterality: N/A;  . right hip surgery    . right shoulder surgery     Family History:  Family History  Problem Relation Age of Onset  . Rheum arthritis Father    Family Psychiatric  History: none Social History:  Social History   Substance and Sexual Activity  Alcohol Use Yes  . Alcohol/week: 20.0 - 30.0 standard drinks  . Types: 20 - 30 Shots of liquor per week   Comment: Last drink 3 days ago 06/22/2018- rum     Social History   Substance and Sexual Activity  Drug Use Yes  . Types: Oxycodone    Social History   Socioeconomic History  .  Marital status: Divorced    Spouse name: Not on file  . Number of children: Not on file  . Years of education: Not on file  . Highest education level: Not on file  Occupational History  . Not on file  Social Needs  . Financial resource strain: Not on file  . Food insecurity:    Worry: Not on file    Inability: Not on file  . Transportation needs:    Medical: Not on file    Non-medical: Not on file  Tobacco Use  . Smoking status: Current Every Day Smoker    Packs/day: 0.50    Types: Cigarettes  . Smokeless tobacco: Never Used  Substance and Sexual Activity  . Alcohol use: Yes    Alcohol/week: 20.0 - 30.0 standard drinks    Types: 20 - 30 Shots of liquor per week    Comment: Last drink 3 days ago 06/22/2018- rum  . Drug use: Yes    Types: Oxycodone  . Sexual activity: Not on file  Lifestyle  . Physical activity:    Days per week: Not on file    Minutes per session: Not on file  . Stress: Not on file  Relationships  . Social connections:    Talks on phone: Not on file    Gets together: Not on file    Attends religious service: Not on file    Active member of club or organization: Not on file    Attends meetings of clubs or organizations: Not on file    Relationship status: Not on file  Other Topics Concern  . Not on file  Social History Narrative   Live with Parents, ambulates with a cane. Falls lately.   Additional Social History:         Lives with parents who are in their 25s and enable his alcoholism and unemployment due to his back pain.   Father has rheumatoid arthritis and mother has cardiac condition. Currently unemployed, on disability Patient reports that he does save most of his disability check, but spends at least $500 of each on alcohol.  He states that his parents have discussed taking his money from him, but have not done so to this point. Patient has 2 siblings with whom he has little contact with.          Sleep: Adequate while  hospitalized  Appetite:  Good  Current Medications: Current Facility-Administered Medications  Medication Dose Route Frequency Provider Last Rate Last Dose  . acetaminophen (TYLENOL) tablet 650 mg  650 mg Oral Q6H PRN Tukov-Yual, Magdalene S, NP   650 mg at 09/02/18 0624  . amoxicillin-clavulanate (AUGMENTIN) 875-125 MG per tablet 1 tablet  1 tablet Oral Q12H Altamese Dilling, MD   1 tablet at 09/04/18 0855  . benzonatate (TESSALON) capsule 100 mg  100 mg Oral TID PRN Tukov-Yual, Magdalene S, NP   100 mg at 09/04/18 0859  . chlordiazePOXIDE (LIBRIUM) capsule 25 mg  25 mg Oral Q6H PRN Mariel Craft, MD   25 mg at 09/02/18 1449  . chlordiazePOXIDE (LIBRIUM) capsule 25 mg  25 mg Oral BH-qamhs Mariel Craft, MD       Followed by  . [START ON 09/05/2018] chlordiazePOXIDE (LIBRIUM) capsule 25 mg  25 mg Oral Daily Mariel Craft, MD      . diltiazem [facility] CD) 24 hr capsule 120 mg  120 mg Oral Daily Seals, Marylene Land H, NP   120 mg at 09/04/18 0856  . enoxaparin (LOVENOX) injection 40 mg  40 mg Subcutaneous Q24H Seals, Angela H, NP   40 mg at 09/03/18 2211  . folic acid (FOLVITE) tablet 1 mg  1 mg Oral Daily Seals, Angela H, NP   1 mg at 09/04/18 0855  . hydrOXYzine (ATARAX/VISTARIL) tablet 25 mg  25 mg Oral Q6H PRN Janeann Merl H, NP   25 mg at 09/03/18 1547  . insulin aspart (novoLOG) injection 0-15 Units  0-15 Units Subcutaneous TID WC Pearletha Alfred, NP   2 Units at 09/03/18 [phone]  . insulin aspart (novoLOG) injection 0-5 Units  0-5 Units Subcutaneous QHS Seals, Angela H, NP      . loperamide (IMODIUM) capsule 2-4 mg  2-4 mg Oral PRN Seals, Milas Kocher, NP      . LORazepam (ATIVAN) tablet 2 mg  2 mg Oral Q4H PRN Altamese Dilling, MD   2 mg at 09/04/18 0629  . magnesium sulfate IVPB 2 g 50 mL  2 g Intravenous Once Maxcine Ham M, RPH      . metFORMIN (GLUCOPHAGE) tablet 500 mg  500 mg Oral Q breakfast Seals, Marylene Land H, NP   500 mg at 09/04/18 0856  . multivitamin with  minerals  tablet 1 tablet  1 tablet Oral Daily Seals, Milas Kocher, NP   1 tablet at 09/04/18 0856  . nicotine (NICODERM CQ - dosed in mg/24 hours) patch 14 mg  14 mg Transdermal Daily Seals, Marylene Land H, NP   14 mg at 09/04/18 0857  . ondansetron (ZOFRAN-ODT) disintegrating tablet 4 mg  4 mg Oral Q6H PRN Seals, Marylene Land H, NP      . oxyCODONE-acetaminophen (PERCOCET/ROXICET) 5-325 MG per tablet 1-2 tablet  1-2 tablet Oral Q6H PRN Altamese Dilling, MD   2 tablet at 09/04/18 0436  . potassium chloride (KLOR-CON) packet 40 mEq  40 mEq Oral Once Maxcine Ham M, RPH      . thiamine (VITAMIN B-1) tablet 100 mg  100 mg Oral Daily Seals, Marylene Land H, NP   100 mg at 09/04/18 0856  . traZODone (DESYREL) tablet 100 mg  100 mg Oral QHS Seals, Angela H, NP   100 mg at 09/03/18 2211    Lab Results:  Results for orders placed or performed during the hospital encounter of 09/01/18 (from the past 48 hour(s))  Glucose, capillary     Status: Abnormal   Collection Time: 09/02/18  1:02 PM  Result Value Ref Range   Glucose-Capillary 133 (H) 70 - 99 mg/dL  Glucose, capillary     Status: Abnormal   Collection Time: 09/02/18  4:06 PM  Result Value Ref Range   Glucose-Capillary 104 (H) 70 - 99 mg/dL  Glucose, capillary     Status: Abnormal   Collection Time: 09/02/18  4:29 PM  Result Value Ref Range   Glucose-Capillary 110 (H) 70 - 99 mg/dL  Glucose, capillary     Status: Abnormal   Collection Time: 09/02/18 10:00 PM  Result Value Ref Range   Glucose-Capillary 100 (H) 70 - 99 mg/dL  Procalcitonin     Status: None   Collection Time: 09/03/18  2:56 AM  Result Value Ref Range   Procalcitonin <0.10 ng/mL    Comment:        Interpretation: PCT (Procalcitonin) <= 0.5 ng/mL: Systemic infection (sepsis) is not likely. Local bacterial infection is possible. (NOTE)       Sepsis PCT Algorithm           Lower Respiratory Tract                                      Infection PCT Algorithm    ----------------------------      ----------------------------         PCT < 0.25 ng/mL                PCT < 0.10 ng/mL         Strongly encourage             Strongly discourage   discontinuation of antibiotics    initiation of antibiotics    ----------------------------     -----------------------------       PCT 0.25 - 0.50 ng/mL            PCT 0.10 - 0.25 ng/mL               OR       >80% decrease in PCT            Discourage initiation of  antibiotics      Encourage discontinuation           of antibiotics    ----------------------------     -----------------------------         PCT >= 0.50 ng/mL              PCT 0.26 - 0.50 ng/mL               AND        <80% decrease in PCT             Encourage initiation of                                             antibiotics       Encourage continuation           of antibiotics    ----------------------------     -----------------------------        PCT >= 0.50 ng/mL                  PCT > 0.50 ng/mL               AND         increase in PCT                  Strongly encourage                                      initiation of antibiotics    Strongly encourage escalation           of antibiotics                                     -----------------------------                                           PCT <= 0.25 ng/mL                                                 OR                                        > 80% decrease in PCT                                     Discontinue / Do not initiate                                             antibiotics Performed at [facility], [address] [place], [place] [address]   Basic metabolic panel     Status: Abnormal   Collection Time: 09/03/18  2:56 AM  Result Value Ref Range   Sodium 138 135 - 145 mmol/L   Potassium 3.4 (L) 3.5 - 5.1 mmol/L   Chloride 104 98 - 111 mmol/L   CO2 25 22 - 32 mmol/L   Glucose, Bld 122 (H) 70 - 99 mg/dL   BUN 6 6 - 20 mg/dL    Creatinine, Ser 1.61 0.61 - 1.24 mg/dL   Calcium 8.7 (L) 8.9 - 10.3 mg/dL   GFR calc non Af Amer >60 >60 mL/min   GFR calc Af Amer >60 >60 mL/min   Anion gap 9 5 - 15    Comment: Performed at [facility], [address] [place], [place] [address]  Magnesium     Status: None   Collection Time: 09/03/18  2:56 AM  Result Value Ref Range   Magnesium 2.1 1.7 - 2.4 mg/dL    Comment: Performed at [facility], [address] [place], [place] [address]  Phosphorus     Status: None   Collection Time: 09/03/18  2:56 AM  Result Value Ref Range   Phosphorus 3.5 2.5 - 4.6 mg/dL    Comment: Performed at [facility], [address] [place], [place] [address]  Glucose, capillary     Status: Abnormal   Collection Time: 09/03/18  8:05 AM  Result Value Ref Range   Glucose-Capillary 123 (H) 70 - 99 mg/dL   Comment 1 Notify RN    Comment 2 Document in Chart   Glucose, capillary     Status: None   Collection Time: 09/03/18 12:03 PM  Result Value Ref Range   Glucose-Capillary 97 70 - 99 mg/dL   Comment 1 Notify RN    Comment 2 Document in Chart   Glucose, capillary     Status: Abnormal   Collection Time: 09/03/18  4:54 PM  Result Value Ref Range   Glucose-Capillary 116 (H) 70 - 99 mg/dL   Comment 1 Notify RN    Comment 2 Document in Chart   Glucose, capillary     Status: Abnormal   Collection Time: 09/03/18  9:15 PM  Result Value Ref Range   Glucose-Capillary 102 (H) 70 - 99 mg/dL  Glucose, capillary     Status: Abnormal   Collection Time: 09/04/18  7:24 AM  Result Value Ref Range   Glucose-Capillary 113 (H) 70 - 99 mg/dL   Comment 1 Notify RN    Comment 2 Document in Chart     Blood Alcohol level:  Lab Results  Component Value Date   ETH 325 (HH) 09/01/2018   ETH 319 (HH) 09/01/2018    Metabolic Disorder Labs: Lab Results  Component Value Date   HGBA1C 5.9 (H) 09/01/2018   MPG 122.63 09/01/2018   MPG 123 08/17/2018   No results found for:  PROLACTIN Lab Results  Component Value Date   TRIG 112 01/20/2018    Physical Findings: AIMS:  , ,  ,  ,    CIWA:  CIWA-Ar Total: 3 COWS:     Musculoskeletal: Strength & Muscle Tone: within normal limits Gait & Station: normal Patient leans: N/A  Psychiatric Specialty Exam: Physical Exam  Nursing note and vitals reviewed. Constitutional: He is oriented to person, place, and time. He appears well-developed and well-nourished. No distress.  HENT:  Head: Normocephalic and atraumatic.  Eyes: EOM are normal.  Neck: Normal range of motion.  Cardiovascular: Normal rate and regular rhythm.  Respiratory: Effort normal. No respiratory distress.  Musculoskeletal: Normal range  of motion.  Neurological: He is alert and oriented to person, place, and time.    Review of Systems  Psychiatric/Behavioral: Positive for depression and substance abuse. Negative for hallucinations and suicidal ideas. The patient is nervous/anxious. The patient does not have insomnia.     Blood pressure 124/85, pulse 95, temperature 97.8 F (36.6 C), temperature source Oral, resp. rate 19, height 5\' 11"  (1.803 m), weight 85.2 kg, SpO2 95 %.Body mass index is 26.2 kg/m.  General Appearance: Disheveled  Eye Contact:  Minimal  Speech:  Clear and Coherent  Volume:  Soft  Mood:  Anxious and Dysphoric  Affect:  Blunt and Congruent  Thought Process:  Linear  Orientation:  Full (Time, Place, and Person)  Thought Content:  Logical and Hallucinations: None  Suicidal Thoughts:  No  Homicidal Thoughts:  No  Memory:  Fair  Judgement:  Poor  Insight:  Shallow  Psychomotor Activity:  Normal  Concentration:  Concentration: Fair and Attention Span: Fair  Recall:  Fair  Fund of Knowledge:  Good  Language:  Good  Akathisia:  No  Handed:  Right  AIMS (if indicated):     Assets:  Communication Skills Housing Social Support  ADL's:  Intact  Cognition:  WNL  Sleep:   adequate in hospital    Treatment Plan  Summary: Daily contact with patient to assess and evaluate symptoms and progress in treatment and Medication management  Continue Librium taper for alcohol detoxification, should be completed 09/05/2018. Start Inderal 10 mg TID for tremor and anxiety management. Continue other home medications.   Disposition: Patient does not meet criteria for psychiatric inpatient admission. Supportive therapy provided about ongoing stressors. Discussed crisis plan, support from social network, calling 911, coming to the Emergency Department, and calling Suicide Hotline. Patient would like to reconsider residential rehabilitation services.  Appreciate social work support in assisting patient with long-term alcohol use rehabilitation program. Will provide resource sheet.  Plan for discharge patient to substance use treatment program when medically stable and at decreased risk of withdrawal seizures/delirium tremens.  Mariel Craft, MD 09/04/2018, 9:42 AM

## 2018-09-04 NOTE — Plan of Care (Signed)
  Problem: Safety: Goal: Ability to remain free from injury will improve Outcome: Progressing   Problem: Safety: Goal: Ability to remain free from injury will improve Outcome: Progressing   Problem: Education: Goal: Knowledge of General Education information will improve Description Including pain rating scale, medication(s)/side effects and non-pharmacologic comfort measures Outcome: Completed/Met

## 2018-09-04 NOTE — Progress Notes (Signed)
[employer] [facility] - [place] at [facility]   PATIENT NAME: Kurt Horton    MR#:  [id number]  DATE OF BIRTH:  August 12, 1960  SUBJECTIVE:  CHIEF COMPLAINT:   Chief Complaint  Patient presents with  . Alcohol Intoxication   Came with alcohol intoxication and noted to have some infiltrate on x-ray. No complaint of chest pain due to repeated coughing.  Asking for stronger pain medicines as he has chronic back pain and he was used to have oxycodone every day at home.  Have worsening cough and some wheezing today.  REVIEW OF SYSTEMS:  CONSTITUTIONAL: No fever, fatigue or weakness.  EYES: No blurred or double vision.  EARS, NOSE, AND THROAT: No tinnitus or ear pain.  RESPIRATORY: No cough, shortness of breath, wheezing or hemoptysis.  CARDIOVASCULAR: No chest pain, orthopnea, edema.  GASTROINTESTINAL: No nausea, vomiting, diarrhea or abdominal pain.  GENITOURINARY: No dysuria, hematuria.  ENDOCRINE: No polyuria, nocturia,  HEMATOLOGY: No anemia, easy bruising or bleeding SKIN: No rash or lesion. MUSCULOSKELETAL: No joint pain or arthritis.   NEUROLOGIC: No tingling, numbness, weakness.  PSYCHIATRY: No anxiety or depression.   ROS  DRUG ALLERGIES:   Allergies  Allergen Reactions  . Nsaids Other (See Comments)    Duodenal ulcers, GI bleeding  . Other Other (See Comments)    Patient states he can't take any "mycin" medications because myasthenia gravis.    VITALS:  Blood pressure 124/85, pulse 95, temperature 97.8 F (36.6 C), temperature source Oral, resp. rate 19, height 5\' 11"  (1.803 m), weight 85.2 kg, SpO2 95 %.  PHYSICAL EXAMINATION:  GENERAL:  58 y.o.-year-old patient lying in the bed with no acute distress.  EYES: Pupils equal, round, reactive to light and accommodation. No scleral icterus. Extraocular muscles intact.  HEENT: Head atraumatic, normocephalic. Oropharynx and nasopharynx clear.  NECK:  Supple, no jugular venous distention. No thyroid enlargement, no  tenderness.  LUNGS: Normal breath sounds bilaterally, no wheezing, some basal right side crepitation. No use of accessory muscles of respiration.  CARDIOVASCULAR: S1, S2 normal. No murmurs, rubs, or gallops.  ABDOMEN: Soft, nontender, nondistended. Bowel sounds present. No organomegaly or mass.  EXTREMITIES: No pedal edema, cyanosis, or clubbing.  NEUROLOGIC: Cranial nerves II through XII are intact. Muscle strength 4/5 in all extremities. Sensation intact. Gait not checked.  Have some shaking. PSYCHIATRIC: The patient is alert and oriented x 3.  Appears anxious. SKIN: No obvious rash, lesion, or ulcer.   Physical Exam LABORATORY PANEL:   CBC Recent Labs  Lab 09/01/18 2205  WBC 4.9  HGB 12.8*  HCT 40.3  PLT 326   ------------------------------------------------------------------------------------------------------------------  Chemistries  Recent Labs  Lab 09/02/18 0515 09/03/18 0256  NA 140 138  K 3.2* 3.4*  CL 103 104  CO2 26 25  GLUCOSE 157* 122*  BUN 6 6  CREATININE 0.69 0.74  CALCIUM 7.9* 8.7*  MG 1.5* 2.1  AST 31  --   ALT 24  --   ALKPHOS 73  --   BILITOT 0.4  --    ------------------------------------------------------------------------------------------------------------------  Cardiac Enzymes No results for input(s): TROPONINI in the last 168 hours. ------------------------------------------------------------------------------------------------------------------  RADIOLOGY:  No results found.  ASSESSMENT AND PLAN:   Principal Problem:   Alcohol intoxication (HCC) Active Problems:   Chronic low back pain   EtOH dependence (HCC)   Substance induced mood disorder (HCC)   Alcohol use disorder, severe, dependence (HCC)  1.  Alcohol withdrawal syndrome - Continue thiamine and folic acid - Librium as needed  for withdrawal symptoms - Will monitor per CIWA protocol -Appreciated psychiatric help who started on Librium tapering dose over the next few  days.  2.  Community-acquired pneumonia-may be aspiration due to chronic alcoholism. - Initial azithromycin and Rocephin given in the emergency room.  Changed to Augmentin.  - DuoNeb every 6 as needed for shortness of breath or cough - Was on oxygen but now on room air.  3 diabetes mellitus Home medications have been restarted.  Will monitor with Accu-Cheks and treat with sliding scale insulin  4.  History of atrial fibrillation with RVR - Anticoagulant therapy initiated with Lovenox - He is on telemetry monitoring and will be admitted to stepdown for close monitoring and observation  5.  DVT and PPI prophylaxis were initiated.  6.  Chest pain due to repeated coughing and chronic back pain. Increased oxycodone dose to 10 mg every 6 hours as needed.  7.  COPD exacerbation-patient have wheezing.  He was smoker in the past. IV steroid, DuoNeb added.  All the records are reviewed and case discussed with Care Management/Social Workerr. Management plans discussed with the patient, family and they are in agreement.  CODE STATUS: Full code.  TOTAL TIME TAKING CARE OF THIS PATIENT: 35 minutes.    POSSIBLE D/C IN 1-2 DAYS, DEPENDING ON CLINICAL CONDITION.   Altamese DillingVaibhavkumar Myrla Malanowski M.D on 09/04/2018   Between 7am to 6pm - Pager - [phone]  After 6pm go to [email or web address] - password [email or web address]  [facility]  Office  [phone]  CC: Primary care physician; Barbette ReichmannHande, Vishwanath, MD  Note: This dictation was prepared with Dragon dictation along with smaller phrase technology. Any transcriptional errors that result from this process are unintentional.

## 2018-09-05 ENCOUNTER — Inpatient Hospital Stay: Payer: Medicare Other

## 2018-09-05 LAB — GLUCOSE, CAPILLARY
Glucose-Capillary: 174 mg/dL — ABNORMAL HIGH (ref 70–99)
Glucose-Capillary: 117 mg/dL — ABNORMAL HIGH (ref 70–99)
Glucose-Capillary: 165 mg/dL — ABNORMAL HIGH (ref 70–99)
Glucose-Capillary: 217 mg/dL — ABNORMAL HIGH (ref 70–99)

## 2018-09-05 MED ORDER — IPRATROPIUM-ALBUTEROL 0.5-2.5 (3) MG/3ML IN SOLN
3.0000 mL | Freq: Three times a day (TID) | RESPIRATORY_TRACT | Status: DC
  Administered 2018-09-05 – 2018-09-07 (×6): 3 mL via RESPIRATORY_TRACT
  Filled 2018-09-05 (×6): qty 3

## 2018-09-05 MED ORDER — ALBUTEROL SULFATE (2.5 MG/3ML) 0.083% IN NEBU
2.5000 mg | INHALATION_SOLUTION | RESPIRATORY_TRACT | Status: DC | PRN
  Administered 2018-09-05: 2.5 mg via RESPIRATORY_TRACT
  Filled 2018-09-05: qty 3

## 2018-09-05 NOTE — Plan of Care (Signed)
?  Problem: Health Behavior/Discharge Planning: ?Goal: Ability to manage health-related needs will improve ?Outcome: Progressing ?  ?Problem: Coping: ?Goal: Level of anxiety will decrease ?Outcome: Progressing ?  ?Problem: Safety: ?Goal: Ability to remain free from injury will improve ?Outcome: Progressing ?  ?

## 2018-09-05 NOTE — Progress Notes (Signed)
[employer] [facility] - [place] at [facility]   PATIENT NAME: Kurt [facility] SimmerJohn Horton    MR#:  [id number]  DATE OF BIRTH:  1960-05-19  SUBJECTIVE:  CHIEF COMPLAINT:   Chief Complaint  Patient presents with  . Alcohol Intoxication   Came with alcohol intoxication and noted to have some infiltrate on x-ray. No complaint of chest pain due to repeated coughing.  Asking for stronger pain medicines as he has chronic back pain and he was used to have oxycodone every day at home.  Cough and wheezing slightly better than yesterday but still bothersome.  REVIEW OF SYSTEMS:  CONSTITUTIONAL: No fever, fatigue or weakness.  EYES: No blurred or double vision.  EARS, NOSE, AND THROAT: No tinnitus or ear pain.  RESPIRATORY: No cough, shortness of breath, wheezing or hemoptysis.  CARDIOVASCULAR: No chest pain, orthopnea, edema.  GASTROINTESTINAL: No nausea, vomiting, diarrhea or abdominal pain.  GENITOURINARY: No dysuria, hematuria.  ENDOCRINE: No polyuria, nocturia,  HEMATOLOGY: No anemia, easy bruising or bleeding SKIN: No rash or lesion. MUSCULOSKELETAL: No joint pain or arthritis.   NEUROLOGIC: No tingling, numbness, weakness.  PSYCHIATRY: No anxiety or depression.   ROS  DRUG ALLERGIES:   Allergies  Allergen Reactions  . Nsaids Other (See Comments)    Duodenal ulcers, GI bleeding  . Other Other (See Comments)    Patient states he can't take any "mycin" medications because myasthenia gravis.    VITALS:  Blood pressure (!) 137/92, pulse 75, temperature 98.6 F (37 C), temperature source Oral, resp. rate 18, height 5\' 11"  (1.803 m), weight 85.2 kg, SpO2 98 %.  PHYSICAL EXAMINATION:  GENERAL:  58 y.o.-year-old patient lying in the bed with no acute distress.  EYES: Pupils equal, round, reactive to light and accommodation. No scleral icterus. Extraocular muscles intact.  HEENT: Head atraumatic, normocephalic. Oropharynx and nasopharynx clear.  NECK:  Supple, no jugular venous distention.  No thyroid enlargement, no tenderness.  LUNGS: Normal breath sounds bilaterally, no wheezing, some basal right side crepitation. No use of accessory muscles of respiration.  CARDIOVASCULAR: S1, S2 normal. No murmurs, rubs, or gallops.  ABDOMEN: Soft, nontender, nondistended. Bowel sounds present. No organomegaly or mass.  EXTREMITIES: No pedal edema, cyanosis, or clubbing.  NEUROLOGIC: Cranial nerves II through XII are intact. Muscle strength 4/5 in all extremities. Sensation intact. Gait not checked.  Have some shaking. PSYCHIATRIC: The patient is alert and oriented x 3.  Appears anxious. SKIN: No obvious rash, lesion, or ulcer.   Physical Exam LABORATORY PANEL:   CBC Recent Labs  Lab 09/01/18 2205  WBC 4.9  HGB 12.8*  HCT 40.3  PLT 326   ------------------------------------------------------------------------------------------------------------------  Chemistries  Recent Labs  Lab 09/02/18 0515 09/03/18 0256  NA 140 138  K 3.2* 3.4*  CL 103 104  CO2 26 25  GLUCOSE 157* 122*  BUN 6 6  CREATININE 0.69 0.74  CALCIUM 7.9* 8.7*  MG 1.5* 2.1  AST 31  --   ALT 24  --   ALKPHOS 73  --   BILITOT 0.4  --    ------------------------------------------------------------------------------------------------------------------  Cardiac Enzymes No results for input(s): TROPONINI in the last 168 hours. ------------------------------------------------------------------------------------------------------------------  RADIOLOGY:  Dg Chest 2 View  Result Date: 09/05/2018 CLINICAL DATA:  Current every day smoker.  Coughing. EXAM: CHEST - 2 VIEW COMPARISON:  September 01, 2018 FINDINGS: No pneumothorax. Mild bibasilar opacities are stable, and better seen on recent CT imaging. The heart, hila, mediastinum, lungs, and pleura are otherwise unchanged. IMPRESSION: 1. Very mild  bibasilar opacities are stable and better seen on previous CT imaging. No interval changes. Electronically Signed   By:  Gerome Sam III M.D   On: 09/05/2018 08:18    ASSESSMENT AND PLAN:   Principal Problem:   Alcohol intoxication (HCC) Active Problems:   Chronic low back pain   EtOH dependence (HCC)   Substance induced mood disorder (HCC)   Alcohol use disorder, severe, dependence (HCC)  1.  Alcohol withdrawal syndrome - Continue thiamine and folic acid - Librium as needed for withdrawal symptoms - Will monitor per CIWA protocol -Appreciated psychiatric help who started on Librium tapering dose over the next few days.  2.  Community-acquired pneumonia-may be aspiration due to chronic alcoholism. - Initial azithromycin and Rocephin given in the emergency room.  Changed to Augmentin.  - DuoNeb every 6 as needed for shortness of breath or cough - Was on oxygen but now on room air.  3 diabetes mellitus Home medications have been restarted.  Will monitor with Accu-Cheks and treat with sliding scale insulin  4.  History of atrial fibrillation with RVR - Anticoagulant therapy initiated with Lovenox - He is on telemetry monitoring and will be admitted to stepdown for close monitoring and observation  5.  DVT and PPI prophylaxis were initiated.  6.  Chest pain due to repeated coughing and chronic back pain. Increased oxycodone dose to 10 mg every 6 hours as needed.  7.  COPD exacerbation-patient have wheezing.  He was smoker in the past. IV steroid, DuoNeb added.  All the records are reviewed and case discussed with Care Management/Social Workerr. Management plans discussed with the patient, family and they are in agreement.  CODE STATUS: Full code.  TOTAL TIME TAKING CARE OF THIS PATIENT: 35 minutes.    POSSIBLE D/C IN 1-2 DAYS, DEPENDING ON CLINICAL CONDITION. Still have intermittent cough which is bothersome and requesting 1 more day in the hospital.  Altamese Dilling M.D on 09/05/2018   Between 7am to 6pm - Pager - [phone]  After 6pm go to [email or web address] - password  [employer]  [facility] [place] [facility]  Office  [phone]  CC: Primary care physician; [name], MD  Note: This dictation was prepared with Dragon dictation along with smaller phrase technology. Any transcriptional errors that result from this process are unintentional.

## 2018-09-05 NOTE — Progress Notes (Signed)
Complains of [facility] MD Progress Note  09/05/2018 10:20 AM Deny Horton  MRN:  [id number] Subjective: "I will need to go home to get my things together before I go to any residential treatment." Principal Problem: Alcohol intoxication (HCC) Diagnosis: Principal Problem:   Alcohol intoxication (HCC) Active Problems:   Chronic low back pain   EtOH dependence (HCC)   Substance induced mood disorder (HCC)   Alcohol use disorder, severe, dependence (HCC)  Total Time spent with patient: 15 minutes   Subjective: "I need help with long-term placement for substance use treatment."  HPI:   Kurt Horton is a 58 y.o. male with a known history of EtOH abuse, hypertension, diabetes mellitus type 2, myasthenia gravis, as well as a history of paroxysmal atrial fibrillation. He presented to the emergency room reporting tremors and alcohol withdrawal. Patient was seen earlier in the day around 5 AM for similar complaint and discharged home at that time. He is complaining of shortness of breath with a 2-day history of nonproductive cough. He denies having been aware of fevers or chills. He denies having had nausea, vomiting, diarrhea. No abdominal pain. Ethanol level on arrival is 325. He states he usually drinks 20 twoounce bottles of alcohol a day. His last reported drink was yesterday 09/01/2018. He continues to smoke about 1 pack/day with a 40-pack-year history of tobacco abuse. On arrival, his magnesium is 1.4. His CTA chest demonstrated right lower lobe groundglass opacity suspicious for viral pneumonia. However, patient was negative for COVID-19 when tested yesterday. No evidence of leukocytosis on labs. He was started on Rocephin and azithromycin in the emergency room.  Admitted to ICU after EMS personalfound the patient on the floor at the house where he lives with family. Patient with slurred speech consistent with alcohol intoxication. Patient found to be febrile by EMS. Patient  denies any shortness of breath, cough, chest or abdominal pain, vomiting or diarrhea, dysuria.Patient also has hx of recurrent falls, severe diabetic neuropathy. On Admission to MICU he is arousable but confused. He is unable to get out of bed due to weakness, reports he has been drinking chronically and had several episodes of ETOH withdrawal in recent past at home. Patient had small volume of vomitus on neck suggestive of likely recurrent aspiration while inebriated  Psychiatry consult is requested for concerns of alcohol abuse and follow-up treatment.  Patient is seen and chart is reviewed.    He had an inpatient psychiatric admission for detox and suicidal ideation from May 25, 2018 - May 30, 2018.  Patient reports his longest period of sobriety is 5 days. He has had 12 presentations to the ED for alcohol intoxication since that time, with 4 episodes requiring medical admission.    During evaluation Kurt Horton is alert and orientated x 4: calm and cooperative; and mood congruent with affect. He does not appear to be responding to internal/ external stimuli or delusional thoughts. Patient denies suicidal/ self-harm/ homicidal ideation, psychosis, and paranoia. Patient answered all questions appropriately.  He denies SI, HI, AVH.  Today, patient complains of cough and states he does not feel ready to be discharged.  Tremor appears to be less and patient does not express concern today.  He is appreciative of getting the list and states he intends to start calling programs to look for a residential alcohol rehabilitation program.  Past Psychiatric History: Depression but mainly a long history of alcohol abuse without any recent ability to stay sober meanwhile with his physical compromise  getting worse  Risk to Self:  No Risk to Others:  No Prior Inpatient Therapy:  Yes Prior Outpatient Therapy:  Yes, last at [facility] January 18 through May 30, 2018   Past Medical History:  Past Medical History:  Diagnosis Date  . A-fib (HCC)   . Alcohol abuse   . Alcohol withdrawal (HCC) 11/12/2016  . Diabetes mellitus without complication (HCC)   . DJD (degenerative joint disease)   . Hypertension   . Myasthenia gravis (HCC)   . Renal disorder     Past Surgical History:  Procedure Laterality Date  . AMPUTATION TOE Left 12/29/2015   Procedure: AMPUTATION TOE;  Surgeon: Gwyneth Revels, DPM;  Location: [facility] ORS;  Service: Podiatry;  Laterality: Left;  . ESOPHAGOGASTRODUODENOSCOPY N/A 02/05/2018   Procedure: ESOPHAGOGASTRODUODENOSCOPY (EGD);  Surgeon: Pasty Spillers, MD;  Location: [facility] ENDOSCOPY;  Service: Endoscopy;  Laterality: N/A;  . PERIPHERAL VASCULAR CATHETERIZATION N/A 12/27/2015   Procedure: Lower Extremity Angiography;  Surgeon: Annice Needy, MD;  Location: [facility] INVASIVE CV LAB;  Service: Cardiovascular;  Laterality: N/A;  . right hip surgery    . right shoulder surgery     Family History:  Family History  Problem Relation Age of Onset  . Rheum arthritis Father    Family Psychiatric  History: none Social History:  Social History   Substance and Sexual Activity  Alcohol Use Yes  . Alcohol/week: 20.0 - 30.0 standard drinks  . Types: 20 - 30 Shots of liquor per week   Comment: Last drink 3 days ago 06/22/2018- rum     Social History   Substance and Sexual Activity  Drug Use Yes  . Types: Oxycodone    Social History   Socioeconomic History  . Marital status: Divorced    Spouse name: Not on file  . Number of children: Not on file  . Years of education: Not on file  . Highest education level: Not on file  Occupational History  . Not on file  Social Needs  . Financial resource strain: Not on file  . Food insecurity:    Worry: Not on file    Inability: Not on file  . Transportation needs:    Medical: Not on file    Non-medical: Not on file  Tobacco Use  . Smoking status: Current Every Day Smoker     Packs/day: 0.50    Types: Cigarettes  . Smokeless tobacco: Never Used  Substance and Sexual Activity  . Alcohol use: Yes    Alcohol/week: 20.0 - 30.0 standard drinks    Types: 20 - 30 Shots of liquor per week    Comment: Last drink 3 days ago 06/22/2018- rum  . Drug use: Yes    Types: Oxycodone  . Sexual activity: Not on file  Lifestyle  . Physical activity:    Days per week: Not on file    Minutes per session: Not on file  . Stress: Not on file  Relationships  . Social connections:    Talks on phone: Not on file    Gets together: Not on file    Attends religious service: Not on file    Active member of club or organization: Not on file    Attends meetings of clubs or organizations: Not on file    Relationship status: Not on file  Other Topics Concern  . Not on file  Social History Narrative   Live with Parents, ambulates with a cane. Falls lately.   Additional  Social History:         Lives with parents who are in their 81s and enable his alcoholism and unemployment due to his back pain.   Father has rheumatoid arthritis and mother has cardiac condition. Currently unemployed, on disability Patient reports that he does save most of his disability check, but spends at least $500 of each on alcohol.  He states that his parents have discussed taking his money from him, but have not done so to this point. Patient has 2 siblings with whom he has little contact with.          Sleep: Adequate while hospitalized  Appetite:  Good  Current Medications: Current Facility-Administered Medications  Medication Dose Route Frequency Provider Last Rate Last Dose  . acetaminophen (TYLENOL) tablet 650 mg  650 mg Oral Q6H PRN Tukov-Yual, Magdalene S, NP   650 mg at 09/04/18 1533  . amoxicillin-clavulanate (AUGMENTIN) 875-125 MG per tablet 1 tablet  1 tablet Oral Q12H Altamese Dilling, MD   1 tablet at 09/05/18 0925  . benzonatate (TESSALON) capsule 100 mg  100 mg Oral TID PRN  Tukov-Yual, Magdalene S, NP   100 mg at 09/04/18 0859  . chlordiazePOXIDE (LIBRIUM) capsule 25 mg  25 mg Oral Q6H PRN Mariel Craft, MD   25 mg at 09/05/18 8119  . chlordiazePOXIDE (LIBRIUM) capsule 25 mg  25 mg Oral Daily Mariel Craft, MD      . diltiazem [facility] CD) 24 hr capsule 120 mg  120 mg Oral Daily Seals, Milas Kocher, NP   120 mg at 09/05/18 1478  . enoxaparin (LOVENOX) injection 40 mg  40 mg Subcutaneous Q24H Seals, Angela H, NP   40 mg at 09/04/18 2227  . folic acid (FOLVITE) tablet 1 mg  1 mg Oral Daily Seals, Milas Kocher, NP   1 mg at 09/05/18 0925  . guaiFENesin-dextromethorphan (ROBITUSSIN DM) 100-10 MG/5ML syrup 5 mL  5 mL Oral Q4H PRN Altamese Dilling, MD   5 mL at 09/05/18 0925  . insulin aspart (novoLOG) injection 0-15 Units  0-15 Units Subcutaneous TID WC Pearletha Alfred, NP   3 Units at 09/05/18 [phone]  . insulin aspart (novoLOG) injection 0-5 Units  0-5 Units Subcutaneous QHS Pearletha Alfred, NP   2 Units at 09/04/18 2227  . ipratropium-albuterol (DUONEB) 0.5-2.5 (3) MG/3ML nebulizer solution 3 mL  3 mL Nebulization Q6H PRN Delfino Lovett, MD      . LORazepam (ATIVAN) tablet 2 mg  2 mg Oral Q4H PRN Altamese Dilling, MD   2 mg at 09/05/18 0052  . magnesium sulfate IVPB 2 g 50 mL  2 g Intravenous Once Maxcine Ham M, RPH      . metFORMIN (GLUCOPHAGE) tablet 500 mg  500 mg Oral Q breakfast Seals, Marylene Land H, NP   500 mg at 09/05/18 2130  . methylPREDNISolone sodium succinate (SOLU-MEDROL) 125 mg/2 mL injection 60 mg  60 mg Intravenous Q24H Altamese Dilling, MD   60 mg at 09/04/18 1520  . multivitamin with minerals tablet 1 tablet  1 tablet Oral Daily Seals, Milas Kocher, NP   1 tablet at 09/05/18 0924  . nicotine (NICODERM CQ - dosed in mg/24 hours) patch 14 mg  14 mg Transdermal Daily Seals, Marylene Land H, NP   14 mg at 09/05/18 0935  . oxyCODONE-acetaminophen (PERCOCET/ROXICET) 5-325 MG per tablet 1-2 tablet  1-2 tablet Oral Q6H PRN Altamese Dilling, MD   2 tablet  at 09/05/18 0924  . potassium chloride (KLOR-CON)  packet 40 mEq  40 mEq Oral Once Mauri ReadingMartin, Savanna M, [facility]      . propranolol (INDERAL) tablet 10 mg  10 mg Oral TID Mariel CraftMaurer, Desiree Daise M, MD   10 mg at 09/05/18 0926  . thiamine (VITAMIN B-1) tablet 100 mg  100 mg Oral Daily Seals, Angela H, NP   100 mg at 09/05/18 [phone]  . traZODone (DESYREL) tablet 100 mg  100 mg Oral QHS Seals, Angela H, NP   100 mg at 09/04/18 2229    Lab Results:  Results for orders placed or performed during the hospital encounter of 09/01/18 (from the past 48 hour(s))  Glucose, capillary     Status: None   Collection Time: 09/03/18 12:03 PM  Result Value Ref Range   Glucose-Capillary 97 70 - 99 mg/dL   Comment 1 Notify RN    Comment 2 Document in Chart   Glucose, capillary     Status: Abnormal   Collection Time: 09/03/18  4:54 PM  Result Value Ref Range   Glucose-Capillary 116 (H) 70 - 99 mg/dL   Comment 1 Notify RN    Comment 2 Document in Chart   Glucose, capillary     Status: Abnormal   Collection Time: 09/03/18  9:15 PM  Result Value Ref Range   Glucose-Capillary 102 (H) 70 - 99 mg/dL  Glucose, capillary     Status: Abnormal   Collection Time: 09/04/18  7:24 AM  Result Value Ref Range   Glucose-Capillary 113 (H) 70 - 99 mg/dL   Comment 1 Notify RN    Comment 2 Document in Chart   Glucose, capillary     Status: Abnormal   Collection Time: 09/04/18 11:48 AM  Result Value Ref Range   Glucose-Capillary 112 (H) 70 - 99 mg/dL   Comment 1 Notify RN    Comment 2 Document in Chart   Glucose, capillary     Status: Abnormal   Collection Time: 09/04/18  5:20 PM  Result Value Ref Range   Glucose-Capillary 134 (H) 70 - 99 mg/dL   Comment 1 Notify RN    Comment 2 Document in Chart   Glucose, capillary     Status: Abnormal   Collection Time: 09/04/18  9:17 PM  Result Value Ref Range   Glucose-Capillary 243 (H) 70 - 99 mg/dL  Glucose, capillary     Status: Abnormal   Collection Time: 09/05/18  9:03 AM  Result Value  Ref Range   Glucose-Capillary 174 (H) 70 - 99 mg/dL    Blood Alcohol level:  Lab Results  Component Value Date   ETH 325 (HH) 09/01/2018   ETH 319 (HH) 09/01/2018    Metabolic Disorder Labs: Lab Results  Component Value Date   HGBA1C 5.9 (H) 09/01/2018   MPG 122.63 09/01/2018   MPG 123 08/17/2018   No results found for: PROLACTIN Lab Results  Component Value Date   TRIG 112 01/20/2018    Physical Findings: AIMS:  , ,  ,  ,    CIWA:  CIWA-Ar Total: 2 COWS:     Musculoskeletal: Strength & Muscle Tone: within normal limits Gait & Station: normal Patient leans: N/A  Psychiatric Specialty Exam: Physical Exam  Nursing note and vitals reviewed. Constitutional: He is oriented to person, place, and time. He appears well-developed and well-nourished. No distress.  HENT:  Head: Normocephalic and atraumatic.  Eyes: EOM are normal.  Neck: Normal range of motion.  Cardiovascular: Normal rate and regular rhythm.  Respiratory: Effort normal. No  respiratory distress.  Musculoskeletal: Normal range of motion.  Neurological: He is alert and oriented to person, place, and time.    Review of Systems  Psychiatric/Behavioral: Positive for substance abuse. Negative for depression, hallucinations and suicidal ideas. The patient is nervous/anxious. The patient does not have insomnia.     Blood pressure (!) 137/92, pulse 81, temperature 98.6 F (37 C), temperature source Oral, resp. rate 16, height  (1.803 m), weight 85.2 kg, SpO2 94 %.Body mass index is 26.2 kg/m.  General Appearance: Disheveled  Eye Contact:  Minimal  Speech:  Clear and Coherent  Volume:  Soft  Mood:  Anxious and Dysphoric  Affect:  Blunt and Congruent  Thought Process:  Linear  Orientation:  Full (Time, Place, and Person)  Thought Content:  Logical and Hallucinations: None  Suicidal Thoughts:  No  Homicidal Thoughts:  No  Memory:  Fair  Judgement:  Poor  Insight:  Shallow  Psychomotor Activity:   Normal  Concentration:  Concentration: Fair and Attention Span: Fair  Recall:  Fair  Fund of Knowledge:  Good  Language:  Good  Akathisia:  No  Handed:  Right  AIMS (if indicated):     Assets:  Communication Skills Housing Social Support  ADL's:  Intact  Cognition:  WNL  Sleep:   adequate in hospital    Treatment Plan Summary: Daily contact with patient to assess and evaluate symptoms and progress in treatment and Medication management  Continue Librium taper for alcohol detoxification, should be completed 09/05/2018. Continue Inderal 10 mg TID for tremor and anxiety management. Continue other home medications.  Continue medical work-up for cough as indicated.  Disposition: Patient does not meet criteria for psychiatric inpatient admission. Supportive therapy provided about ongoing stressors. Discussed crisis plan, support from social network, calling 911, coming to the Emergency Department, and calling Suicide Hotline. Patient would like to reconsider residential rehabilitation services.  Appreciate social work support in assisting patient with long-term alcohol use rehabilitation program. Resource sheet for long-term alcohol use rehabilitation treatment programs has been provided to patient.  He has been instructed to start contacting services in order to set a placement date.  Patient states he will begin calling today.  Plan for discharge patient to substance use treatment program when medically stable and at decreased risk of withdrawal seizures/delirium tremens.  Mariel Craft, MD 09/05/2018, 10:20 AM

## 2018-09-05 NOTE — Evaluation (Signed)
Physical Therapy Evaluation Patient Details Name: Kurt Horton MRN: [id number] DOB: [name][birth year] Today's Date: 09/05/2018   History of Present Illness  58 y.o. male  with a known history of EtOH abuse, hypertension, diabetes mellitus type 2, myasthenia gravis, lumbar DJD/DDD, as well as a history of paroxysmal atrial fibrillation.  He presented to the emergency room reporting tremors and alcohol withdrawal.  Patient with recent ED/admissions.    Clinical Impression  Patient alert, oriented, behavior WFLs. Pt reported living with his parents who are available 24/7, [facility], RW for community ambulation when able. Does not drive.    O2 assessed throughout, WFLs with mobility. Pt reported mild LBP, repositioned and RN notified. Pt performed bed mobility mod I, demonstrated good balance sitting EOB. Generalized weakness noted of LE with MMT. Sit <> Stand with RW and CGA. 1 mild LOB posteriorly, able to correct with use of posterior legs and minA from PT. Pt ambulated ~17180ft with RW and CGA. Significantly decreased gait velocity, shuffling step, decreased stride length bilaterally, occasional weaving of gait. No LOB noted, cues for 1-2 standing rest breaks.  Overall the patient demonstrated deficits (see "PT Problem List") that impede the patient's functional abilities, safety, and mobility and would benefit from skilled PT intervention. Recommendation HHPT with supervision 24/7 to increase safety.     Follow Up Recommendations Home health PT;Supervision/Assistance - 24 hour;Supervision for mobility/OOB    Equipment Recommendations  None recommended by PT(Pt reported having a RW)    Recommendations for Other Services       Precautions / Restrictions Precautions Precautions: Fall Precaution Comments: continues to have balance difficulty Restrictions Weight Bearing Restrictions: No      Mobility  Bed Mobility Overal bed mobility: Modified Independent                 Transfers Overall transfer level: Needs assistance Equipment used: Rolling walker (2 wheeled) Transfers: Sit to/from Stand Sit to Stand: Min guard         General transfer comment: from slightly elevated bed surface. mild posterior LOB, able to correct with use of posterior legs on bed as well as minA from PT.  Ambulation/Gait Ambulation/Gait assistance: Min guard Gait Distance (Feet): 180 Feet Assistive device: Rolling walker (2 wheeled) Gait Pattern/deviations: Shuffle;Trunk flexed     General Gait Details: Significantly decreased gait velocity, shuffling step, decreased stride length bilaterally, occasional weaving of gait. No LOB noted, cues for 1-2 standing rest breaks.  Stairs            Wheelchair Mobility    Modified Rankin (Stroke Patients Only)       Balance Overall balance assessment: Needs assistance Sitting-balance support: No upper extremity supported;Feet supported Sitting balance-Leahy Scale: Good Sitting balance - Comments: Pt able to maintain sitting balance w/o assist and no overt safety issues   Standing balance support: Bilateral upper extremity supported Standing balance-Leahy Scale: Poor                               Pertinent Vitals/Pain Pain Assessment: Faces Faces Pain Scale: Hurts a little bit Pain Location: chronic back pain Pain Descriptors / Indicators: Aching Pain Intervention(s): Limited activity within patient's tolerance;Monitored during session;Repositioned    Home Living Family/patient expects to be discharged to:: Private residence Living Arrangements: Parent Available Help at Discharge: Family;Available 24 hours/day Type of Home: House Home Access: Stairs to enter Entrance Stairs-Rails: None Entrance Stairs-Number of Steps:  2 Home Layout: One level Home Equipment: Walker - 2 wheels;Cane - single point      Prior Function Level of Independence: Independent with assistive device(s)          Comments: Frequent falls. Does not drive, occasionally goes with father to grocery store     Hand Dominance   Dominant Hand: Right    Extremity/Trunk Assessment   Upper Extremity Assessment Upper Extremity Assessment: Generalized weakness    Lower Extremity Assessment Lower Extremity Assessment: LLE deficits/detail;RLE deficits/detail RLE Deficits / Details: hip flexion 3+/5, knee extension 4-/5, knee flexion 4/5, DF/PF 3+/5 LLE Deficits / Details: hip flexion 4-/5, knee extension 4/5, knee flexion 4/5, DF/PF 3+/5       Communication   Communication: No difficulties  Cognition Arousal/Alertness: Awake/alert Behavior During Therapy: Anxious Overall Cognitive Status: Within Functional Limits for tasks assessed                                        General Comments      Exercises     Assessment/Plan    PT Assessment Patient needs continued PT services  PT Problem List Decreased strength;Decreased range of motion;Decreased activity tolerance;Decreased balance;Decreased mobility;Decreased coordination;Decreased knowledge of use of DME;Decreased safety awareness;Pain       PT Treatment Interventions DME instruction;Gait training;Stair training;Therapeutic activities;Functional mobility training;Therapeutic exercise;Balance training;Neuromuscular re-education;Patient/family education    PT Goals (Current goals can be found in the Care Plan section)  Acute Rehab PT Goals Patient Stated Goal: Pt wants to walk better PT Goal Formulation: With patient Time For Goal Achievement: 09/19/18 Potential to Achieve Goals: Fair    Frequency Min 2X/week   Barriers to discharge        Co-evaluation               AM-PAC PT "6 Clicks" Mobility  Outcome Measure Help needed turning from your back to your side while in a flat bed without using bedrails?: None Help needed moving from lying on your back to sitting on the side of a flat bed without  using bedrails?: None Help needed moving to and from a bed to a chair (including a wheelchair)?: A Little Help needed standing up from a chair using your arms (e.g., wheelchair or bedside chair)?: A Little Help needed to walk in hospital room?: A Little Help needed climbing 3-5 steps with a railing? : A Lot 6 Click Score: 19    End of Session Equipment Utilized During Treatment: Gait belt Activity Tolerance: Patient tolerated treatment well;Patient limited by fatigue Patient left: with call bell/phone within reach;in chair;with chair alarm set;with SCD's reapplied Nurse Communication: Mobility status PT Visit Diagnosis: Muscle weakness (generalized) (M62.81);Repeated falls (R29.6);Unsteadiness on feet (R26.81);Other abnormalities of gait and mobility (R26.89);History of falling (Z91.81);Pain    Time: 9643-8381 PT Time Calculation (min) (ACUTE ONLY): 42 min   Charges:   PT Evaluation $PT Eval Low Complexity: 1 Low PT Treatments $Therapeutic Exercise: 23-37 mins        Olga Coaster PT, DPT 11:22 AM,09/05/18 [phone]

## 2018-09-05 NOTE — Plan of Care (Signed)
  Problem: Health Behavior/Discharge Planning: Goal: Ability to manage health-related needs will improve Outcome: Progressing   Problem: Coping: Goal: Level of anxiety will decrease Outcome: Progressing   Problem: Safety: Goal: Ability to remain free from injury will improve Outcome: Progressing   Problem: Education: Goal: Knowledge of disease or condition will improve Outcome: Progressing Goal: Understanding of discharge needs will improve Outcome: Progressing   Problem: Health Behavior/Discharge Planning: Goal: Ability to identify changes in lifestyle to reduce recurrence of condition will improve Outcome: Progressing Goal: Identification of resources available to assist in meeting health care needs will improve Outcome: Progressing   Problem: Physical Regulation: Goal: Complications related to the disease process, condition or treatment will be avoided or minimized Outcome: Progressing   Problem: Safety: Goal: Ability to remain free from injury will improve Outcome: Progressing

## 2018-09-06 LAB — GLUCOSE, CAPILLARY
Glucose-Capillary: 157 mg/dL — ABNORMAL HIGH (ref 70–99)
Glucose-Capillary: 127 mg/dL — ABNORMAL HIGH (ref 70–99)
Glucose-Capillary: 133 mg/dL — ABNORMAL HIGH (ref 70–99)
Glucose-Capillary: 264 mg/dL — ABNORMAL HIGH (ref 70–99)

## 2018-09-06 LAB — CULTURE, BLOOD (ROUTINE X 2)
Culture: NO GROWTH
Culture: NO GROWTH
Special Requests: ADEQUATE

## 2018-09-06 NOTE — Progress Notes (Signed)
Pt blood sugarat 261. Glucometer no sinking yet. Will continue to monitor.

## 2018-09-06 NOTE — TOC Initial Note (Signed)
Transition of Care [facility](TOC) - Initial/Assessment Note    Patient Details  Name: Kurt Horton MRN: [id number] Date of Birth: [birth year]  Transition of Care [facility](TOC) CM/SW Contact:    York SpanielMonica Caleah Tortorelli, LCSW Phone Number: 09/06/2018, 11:37 AM  Clinical Narrative:                Patient seen yesterday (4/30) by CSW. Patient had requested CSW because he wanted to discuss the fact that he could find the medicaid papers that he had so that he could re-apply for medicaid. He was asking if more could be delivered to his room. CSW informed him that financial services could be contacted to obtain another copy. Patient was not interested in discussing his alcoholism at that time. He has been given resources on each admission for outpatient rehab centers that will take patients with no insurance. He shows no signs of motivation or being vested in recovery. Psychiatry recommendations noted. Nothing further needed at this time.    Expected Discharge Plan: Home/Self Care Barriers to Discharge: No Barriers Identified   Patient Goals and CMS Choice        Expected Discharge Plan and Services Expected Discharge Plan: Home/Self Care       Living arrangements for the past 2 months: Single Family Home                                      Prior Living Arrangements/Services Living arrangements for the past 2 months: Single Family Home Lives with:: Parents Patient language and need for interpreter reviewed:: Yes Do you feel safe going back to the place where you live?: Yes      Need for Family Participation in Patient Care: Yes (Comment) Care giver support system in place?: Yes (comment)   Criminal Activity/Legal Involvement Pertinent to Current Situation/Hospitalization: No - Comment as needed  Activities of Daily Living Home Assistive Devices/Equipment: Cane (specify quad or straight), Walker (specify type), CPAP ADL Screening (condition at time of admission) Patient's cognitive ability  adequate to safely complete daily activities?: Yes Is the patient deaf or have difficulty hearing?: No Does the patient have difficulty seeing, even when wearing glasses/contacts?: No Does the patient have difficulty concentrating, remembering, or making decisions?: No Patient able to express need for assistance with ADLs?: Yes Does the patient have difficulty dressing or bathing?: No Independently performs ADLs?: Yes (appropriate for developmental age) Does the patient have difficulty walking or climbing stairs?: No Weakness of Legs: None Weakness of Arms/Hands: None  Permission Sought/Granted                  Emotional Assessment Appearance:: Appears stated age Attitude/Demeanor/Rapport: Self-Absorbed(cooperative) Affect (typically observed): In denial, Calm Orientation: : Oriented to Self, Oriented to Place, Oriented to  Time, Oriented to Situation Alcohol / Substance Use: Alcohol Use Psych Involvement: Yes (comment)  Admission diagnosis:  Acute respiratory failure with hypoxia (HCC) [J96.01] Alcoholic intoxication with complication [facility](HCC) [F10.929] Patient Active Problem List   Diagnosis Date Noted  . Alcohol withdrawal (HCC) 08/26/2018  . A-fib (HCC) 08/17/2018  . Tobacco use disorder 05/27/2018  . GI bleed 02/04/2018  . Alcohol intoxication (HCC) 12/22/2017  . Malnutrition of moderate degree 2020/12/117  . Chronic combined systolic and diastolic CHF (congestive heart failure) (HCC) 08/13/2017  . Alcohol withdrawal delirium (HCC) 02/26/2017  . Atrial fibrillation with RVR (HCC) 02/25/2017  . Acute alcoholic intoxication with complication (HCC)   .  Alcohol use disorder, severe, dependence (HCC)   . Substance induced mood disorder (HCC) 11/13/2016  . SVT (supraventricular tachycardia) (HCC) 11/12/2016  . HTN (hypertension) 11/12/2016  . EtOH dependence (HCC) 11/12/2016  . Lumbar radiculopathy 08/24/2016  . Major depressive disorder, recurrent severe without psychotic  features (HCC) 08/24/2016  . Chronic low back pain 01/04/2016  . Pressure ulcer 12/29/2015  . Osteomyelitis (HCC) 12/26/2015  . PVD (peripheral vascular disease) (HCC) 12/26/2015  . Type II diabetes mellitus with manifestations (HCC) 12/26/2015  . Myasthenia gravis (HCC) 12/26/2015   PCP:  [name], MD Pharmacy:   [facility] [place], [place] - [address] [place] [address] Phone: [phone] Fax: [phone]  [facility] - [place], [place] - [address] [place] [address] Phone: [phone] Fax: [phone]  [id number] [place], [place] - [place] [address] AT [facility] [address] [place] [address] Phone: [phone] Fax: [phone]     Social Determinants of Health (SDOH) Interventions    Readmission Risk Interventions Readmission Risk Prevention Plan 08/19/2018  Transportation Screening Complete  Medication Review (RN Care Manager) Complete  PCP or Specialist appointment within 3-5 days of discharge Complete  HRI or Home Care Consult Complete  SW Recovery Care/Counseling Consult Complete  Palliative Care Screening Not Applicable  Skilled Nursing Facility Not Applicable  Some recent data might be hidden

## 2018-09-06 NOTE — Plan of Care (Signed)
  Problem: Health Behavior/Discharge Planning: Goal: Ability to manage health-related needs will improve Outcome: Progressing   Problem: Coping: Goal: Level of anxiety will decrease Outcome: Progressing   Problem: Safety: Goal: Ability to remain free from injury will improve Outcome: Progressing   Problem: Safety: Goal: Ability to remain free from injury will improve Outcome: Progressing

## 2018-09-06 NOTE — Progress Notes (Signed)
Physical Therapy Treatment Patient Details Name: Kurt Horton MRN: [id number] DOB: Jan 12, 1961 Today's Date: 09/06/2018    History of Present Illness 58 y.o. male  with a known history of EtOH abuse, hypertension, diabetes mellitus type 2, myasthenia gravis, lumbar DJD/DDD, as well as a history of paroxysmal atrial fibrillation.  He presented to the emergency room reporting tremors and alcohol withdrawal.  Patient with recent ED/admissions.    PT Comments    Patient agreeable to PT, able to mobilize to sit EOB mod I for therapeutic exercises. Pt was able to perform exercises with verbal cues/visual cues, no physical assist needed. Patient ambulated ~141ft with RW and CGA, intermittent cues needed to improve posture and for proper RW management (tended to ambulate outside base of support) with fair carry over. Pt demonstrated improved gait velocity and activity tolerance compared to prior session. The patient demonstrated improvement towards goals, and would benefit from continued skilled PT intervention to maximize independence, safety, and mobility.      Follow Up Recommendations  Home health PT;Supervision/Assistance - 24 hour;Supervision for mobility/OOB     Equipment Recommendations  None recommended by PT    Recommendations for Other Services       Precautions / Restrictions Precautions Precautions: Fall Precaution Comments: continues to have balance difficulty Restrictions Weight Bearing Restrictions: No    Mobility  Bed Mobility Overal bed mobility: Modified Independent                Transfers Overall transfer level: Needs assistance Equipment used: Rolling walker (2 wheeled) Transfers: Sit to/from Stand Sit to Stand: Min guard         General transfer comment: from slightly elevated bed surface. mild posterior LOB, able to correct with use of posterior legs on bed   Ambulation/Gait Ambulation/Gait assistance: Min guard Gait Distance (Feet): 180  Feet Assistive device: Rolling walker (2 wheeled) Gait Pattern/deviations: Shuffle;Trunk flexed     General Gait Details: Verbal and visual cues to improve use of RW/safety. Pt tended to ambulate outside of base of support of RW. improved gait velocity and tolerance noted this session, HR in 120s, spO2 >93%.   Stairs             Wheelchair Mobility    Modified Rankin (Stroke Patients Only)       Balance Overall balance assessment: Needs assistance Sitting-balance support: No upper extremity supported;Feet supported Sitting balance-Leahy Scale: Good       Standing balance-Leahy Scale: Fair                              Cognition Arousal/Alertness: Awake/alert Behavior During Therapy: Anxious Overall Cognitive Status: Within Functional Limits for tasks assessed                                        Exercises General Exercises - Lower Extremity Long Arc Quad: Strengthening;Both;15 reps Hip Flexion/Marching: Strengthening;Both;15 reps Toe Raises: Strengthening;Both;15 reps Heel Raises: Strengthening;Both;15 reps Other Exercises Other Exercises: PT able to forward flex while seated and perform back extension in sitting as well mod I    General Comments        Pertinent Vitals/Pain Pain Assessment: No/denies pain    Home Living                      Prior Function  PT Goals (current goals can now be found in the care plan section) Progress towards PT goals: Progressing toward goals    Frequency    Min 2X/week      PT Plan Current plan remains appropriate    Co-evaluation              AM-PAC PT "6 Clicks" Mobility   Outcome Measure  Help needed turning from your back to your side while in a flat bed without using bedrails?: None Help needed moving from lying on your back to sitting on the side of a flat bed without using bedrails?: None Help needed moving to and from a bed to a chair  (including a wheelchair)?: A Little Help needed standing up from a chair using your arms (e.g., wheelchair or bedside chair)?: A Little Help needed to walk in hospital room?: A Little Help needed climbing 3-5 steps with a railing? : A Lot 6 Click Score: 19    End of Session Equipment Utilized During Treatment: Gait belt Activity Tolerance: Patient tolerated treatment well;Patient limited by fatigue Patient left: with call bell/phone within reach;in chair;with chair alarm set;with SCD's reapplied Nurse Communication: Mobility status PT Visit Diagnosis: Muscle weakness (generalized) (M62.81);Repeated falls (R29.6);Unsteadiness on feet (R26.81);Other abnormalities of gait and mobility (R26.89);History of falling (Z91.81);Pain     Time: [id number] PT Time Calculation (min) (ACUTE ONLY): 29 min  Charges:  $Gait Training: 8-22 mins $Therapeutic Exercise: 8-22 mins                     Olga Coasteriana Mashell Sieben PT, DPT 12:18 PM,09/06/18 [phone]

## 2018-09-06 NOTE — Consult Note (Signed)
[facility] Face-to-Face Psychiatry Consult   Reason for Consult: Follow-up consult for this patient with alcohol abuse and delirium tremens Referring Physician: Dr. Manson Passey Patient Identification: Kurt Horton MRN:  [id number] Principal Diagnosis: Alcohol intoxication [facility]) Diagnosis:  Principal Problem:   Alcohol intoxication (HCC) Active Problems:   Chronic low back pain   EtOH dependence (HCC)   Substance induced mood disorder (HCC)   Alcohol use disorder, severe, dependence (HCC)  Total Time spent with patient: 15 minutes  Subjective: Would like assistance regarding his issues with his divorce from 2017 and medication for discharge.  HPI:   On admission on 09/01/17: Kurt Horton is a 58 y.o. male  with a known history of EtOH abuse, hypertension, diabetes mellitus type 2, myasthenia gravis, as well as a history of paroxysmal atrial fibrillation.  BAL 325 with last drink reported on 4/26. Admitted to ICU after EMS personal found the patient on the floor at the house where he lives with family. Patient with slurred speech consistent with alcohol intoxication. Patient found to be febrile by EMS. Comp  Psychiatry consult requested for concerns of alcohol abuse, Dr Viviano Simas requested substance abuse treatment on 09/05/18.  She also noted:  He had an inpatient psychiatric admission for detox and suicidal ideation from January18, 2020 - May 30, 2018.  Patient reports his longest period of sobriety is 5 days. He has had 12 presentations to the ED for alcohol intoxication since that time, with 4 episodes requiring medical admission.    Patient was seen today, resting quietly on his bed watching television.  Denies any withdrawal symptoms but contributes this to his Librium taper.  No suicidal/homicidal ideations, hallucinations.  He would like some medication to take home to assist his symptoms, may discharge in the next couple of days.  Interested in talking to someone regarding his drinking  and divorce in 2017 which led his wife bringing him to [place] to his parents house where he is presently living.  Chronic back pain which triggers his depression and alcohol consumption.    Past Psychiatric History: Depression, alcohol dependence  Risk to Self:  No Risk to Others:  No Prior Inpatient Therapy:  Yes Prior Outpatient Therapy:  Yes, last at [facility] January 18 through May 30, 2018  Past Medical History:  Past Medical History:  Diagnosis Date  . A-fib (HCC)   . Alcohol abuse   . Alcohol withdrawal (HCC) 11/12/2016  . Diabetes mellitus without complication (HCC)   . DJD (degenerative joint disease)   . Hypertension   . Myasthenia gravis (HCC)   . Renal disorder     Past Surgical History:  Procedure Laterality Date  . AMPUTATION TOE Left 12/29/2015   Procedure: AMPUTATION TOE;  Surgeon: Gwyneth Revels, DPM;  Location: [facility] ORS;  Service: Podiatry;  Laterality: Left;  . ESOPHAGOGASTRODUODENOSCOPY N/A 02/05/2018   Procedure: ESOPHAGOGASTRODUODENOSCOPY (EGD);  Surgeon: Pasty Spillers, MD;  Location: [facility] ENDOSCOPY;  Service: Endoscopy;  Laterality: N/A;  . PERIPHERAL VASCULAR CATHETERIZATION N/A 12/27/2015   Procedure: Lower Extremity Angiography;  Surgeon: Annice Needy, MD;  Location: [facility] INVASIVE CV LAB;  Service: Cardiovascular;  Laterality: N/A;  . right hip surgery    . right shoulder surgery     Family History:  Family History  Problem Relation Age of Onset  . Rheum arthritis Father    Family Psychiatric  History: None  Social History:  Social History   Substance and Sexual Activity  Alcohol Use Yes  . Alcohol/week:  20.0 - 30.0 standard drinks  . Types: 20 - 30 Shots of liquor per week   Comment: Last drink 3 days ago 06/22/2018- rum     Social History   Substance and Sexual Activity  Drug Use Yes  . Types: Oxycodone    Social History   Socioeconomic History  . Marital status: Divorced    Spouse name: Not on file  .  Number of children: Not on file  . Years of education: Not on file  . Highest education level: Not on file  Occupational History  . Not on file  Social Needs  . Financial resource strain: Not on file  . Food insecurity:    Worry: Not on file    Inability: Not on file  . Transportation needs:    Medical: Not on file    Non-medical: Not on file  Tobacco Use  . Smoking status: Current Every Day Smoker    Packs/day: 0.50    Types: Cigarettes  . Smokeless tobacco: Never Used  Substance and Sexual Activity  . Alcohol use: Yes    Alcohol/week: 20.0 - 30.0 standard drinks    Types: 20 - 30 Shots of liquor per week    Comment: Last drink 3 days ago 06/22/2018- rum  . Drug use: Yes    Types: Oxycodone  . Sexual activity: Not on file  Lifestyle  . Physical activity:    Days per week: Not on file    Minutes per session: Not on file  . Stress: Not on file  Relationships  . Social connections:    Talks on phone: Not on file    Gets together: Not on file    Attends religious service: Not on file    Active member of club or organization: Not on file    Attends meetings of clubs or organizations: Not on file    Relationship status: Not on file  Other Topics Concern  . Not on file  Social History Narrative   Live with Parents, ambulates with a cane. Falls lately.   Additional Social History: Lives with parents: Father and mother are 62 years old, father has rheumatoid arthritis and mother has cardiac condition. Currently unemployed, on disability Patient reports that he does save most of his disability check, but spends at least $500 of each on alcohol.  He states that his parents have discussed taking his money from him, but have not done so to this point. Patient has 2 siblings with whom he has little contact with.    Allergies:   Allergies  Allergen Reactions  . Nsaids Other (See Comments)    Duodenal ulcers, GI bleeding  . Other Other (See Comments)    Patient states he can't  take any "mycin" medications because myasthenia gravis.    Labs:  Results for orders placed or performed during the hospital encounter of 09/01/18 (from the past 48 hour(s))  Glucose, capillary     Status: Abnormal   Collection Time: 09/04/18  5:20 PM  Result Value Ref Range   Glucose-Capillary 134 (H) 70 - 99 mg/dL   Comment 1 Notify RN    Comment 2 Document in Chart   Glucose, capillary     Status: Abnormal   Collection Time: 09/04/18  9:17 PM  Result Value Ref Range   Glucose-Capillary 243 (H) 70 - 99 mg/dL  Glucose, capillary     Status: Abnormal   Collection Time: 09/05/18  9:03 AM  Result Value Ref Range   Glucose-Capillary  174 (H) 70 - 99 mg/dL  Glucose, capillary     Status: Abnormal   Collection Time: 09/05/18 11:26 AM  Result Value Ref Range   Glucose-Capillary 117 (H) 70 - 99 mg/dL  Glucose, capillary     Status: Abnormal   Collection Time: 09/05/18  5:34 PM  Result Value Ref Range   Glucose-Capillary 165 (H) 70 - 99 mg/dL  Glucose, capillary     Status: Abnormal   Collection Time: 09/05/18  9:24 PM  Result Value Ref Range   Glucose-Capillary 217 (H) 70 - 99 mg/dL  Glucose, capillary     Status: Abnormal   Collection Time: 09/06/18  8:04 AM  Result Value Ref Range   Glucose-Capillary 157 (H) 70 - 99 mg/dL  Glucose, capillary     Status: Abnormal   Collection Time: 09/06/18 11:39 AM  Result Value Ref Range   Glucose-Capillary 127 (H) 70 - 99 mg/dL    Current Facility-Administered Medications  Medication Dose Route Frequency Provider Last Rate Last Dose  . acetaminophen (TYLENOL) tablet 650 mg  650 mg Oral Q6H PRN Tukov-Yual, Magdalene S, NP   650 mg at 09/04/18 1533  . albuterol (PROVENTIL) (2.5 MG/3ML) 0.083% nebulizer solution 2.5 mg  2.5 mg Nebulization Q2H PRN Altamese Dilling, MD   2.5 mg at 09/05/18 1204  . amoxicillin-clavulanate (AUGMENTIN) 875-125 MG per tablet 1 tablet  1 tablet Oral Q12H Altamese Dilling, MD   1 tablet at 09/06/18 [phone]  .  benzonatate (TESSALON) capsule 100 mg  100 mg Oral TID PRN Tukov-Yual, Magdalene S, NP   100 mg at 09/04/18 0859  . diltiazem (CARDIZEM CD) 24 hr capsule 120 mg  120 mg Oral Daily Seals, Marylene Land H, NP   120 mg at 09/06/18 7972  . enoxaparin (LOVENOX) injection 40 mg  40 mg Subcutaneous Q24H Seals, Angela H, NP   40 mg at 09/05/18 2202  . folic acid (FOLVITE) tablet 1 mg  1 mg Oral Daily Seals, Milas Kocher, NP   1 mg at 09/06/18 8206  . guaiFENesin-dextromethorphan (ROBITUSSIN DM) 100-10 MG/5ML syrup 5 mL  5 mL Oral Q4H PRN Altamese Dilling, MD   5 mL at 09/05/18 1811  . insulin aspart (novoLOG) injection 0-15 Units  0-15 Units Subcutaneous TID WC Seals, Milas Kocher, NP   2 Units at 09/06/18 1158  . insulin aspart (novoLOG) injection 0-5 Units  0-5 Units Subcutaneous QHS Pearletha Alfred, NP   2 Units at 09/05/18 2203  . ipratropium-albuterol (DUONEB) 0.5-2.5 (3) MG/3ML nebulizer solution 3 mL  3 mL Nebulization TID Altamese Dilling, MD   3 mL at 09/06/18 0804  . LORazepam (ATIVAN) tablet 2 mg  2 mg Oral Q4H PRN Altamese Dilling, MD   2 mg at 09/06/18 0600  . magnesium sulfate IVPB 2 g 50 mL  2 g Intravenous Once Maxcine Ham M, RPH      . metFORMIN (GLUCOPHAGE) tablet 500 mg  500 mg Oral Q breakfast Seals, Marylene Land H, NP   500 mg at 09/06/18 0156  . methylPREDNISolone sodium succinate (SOLU-MEDROL) 125 mg/2 mL injection 60 mg  60 mg Intravenous Q24H Altamese Dilling, MD   60 mg at 09/05/18 1546  . multivitamin with minerals tablet 1 tablet  1 tablet Oral Daily Seals, Milas Kocher, NP   1 tablet at 09/06/18 1537  . nicotine (NICODERM CQ - dosed in mg/24 hours) patch 14 mg  14 mg Transdermal Daily Seals, Angela H, NP   14 mg at 09/06/18 0835  .  oxyCODONE-acetaminophen (PERCOCET/ROXICET) 5-325 MG per tablet 1-2 tablet  1-2 tablet Oral Q6H PRN Altamese DillingVachhani, Vaibhavkumar, MD   2 tablet at 09/06/18 0600  . potassium chloride (KLOR-CON) packet 40 mEq  40 mEq Oral Once Mauri ReadingMartin, Savanna M, [facility] FacilityRPH      .  propranolol (INDERAL) tablet 10 mg  10 mg Oral TID Mariel CraftMaurer, Sheila M, MD   10 mg at 09/06/18 [id number]  . thiamine (VITAMIN B-1) tablet 100 mg  100 mg Oral Daily Seals, Marylene Landngela H, NP   100 mg at 09/06/18 [id number]  . traZODone (DESYREL) tablet 100 mg  100 mg Oral QHS Seals, Angela H, NP   100 mg at 09/05/18 2202    Musculoskeletal: Strength & Muscle Tone: within normal limits Gait & Station: normal Patient leans: N/A  Psychiatric Specialty Exam: Physical Exam  Nursing note and vitals reviewed. Constitutional: He is oriented to person, place, and time. He appears well-developed and well-nourished.  HENT:  Head: Normocephalic and atraumatic.  Eyes: EOM are normal.  Neck: Normal range of motion.  Cardiovascular: Normal rate and regular rhythm.  Respiratory: Effort normal. No respiratory distress.  Musculoskeletal: Normal range of motion.  Neurological: He is alert and oriented to person, place, and time.  Psychiatric: His speech is normal and behavior is normal. Judgment and thought content normal. His mood appears anxious. Cognition and memory are normal. He expresses no homicidal and no suicidal ideation.    Review of Systems  Constitutional: Negative.   HENT: Negative.   Respiratory: Negative.   Cardiovascular: Negative.   Gastrointestinal: Negative.   Genitourinary: Negative.   Musculoskeletal: Positive for back pain and myalgias.  Neurological: Positive for tremors.  Psychiatric/Behavioral: Positive for substance abuse. Negative for depression, hallucinations, memory loss and suicidal ideas. The patient is nervous/anxious (withdrawal effect). The patient does not have insomnia.     Blood pressure 122/84, pulse 73, temperature 98.4 F (36.9 C), resp. rate 18, height 5\' 11"  (1.803 m), weight 85.2 kg, SpO2 98 %.Body mass index is 26.2 kg/m.  General Appearance: Disheveled  Eye Contact:  Good  Speech:  Clear and Coherent and Normal Rate  Volume:  Normal  Mood:  Anxious at times  Affect:   Congruent  Thought Process:  Goal Directed and Descriptions of Associations: Intact  Orientation:  Full (Time, Place, and Person)  Thought Content:  Logical  Suicidal Thoughts:  No  Homicidal Thoughts:  No  Memory:  Immediate;   Fair Recent;   Fair Remote;   Fair  Judgement:  Fair  Insight:  Fair  Psychomotor Activity:  Decreased  Concentration:  Concentration: Fair  Recall:  Fis[employer]Fair  Fund of Knowledge:  Fair  Language:  Fair  Akathisia:  No  Handed:  Right  AIMS (if indicated):     Assets:  Communication Skills Housing Social Support  ADL's:  Intact  Cognition:  WNL  Sleep:   adequate    Treatment Plan Summary: Daily contact with patient to assess and evaluate symptoms and progress in treatment and Medication management  Alcohol dependence with uncomplicated withdrawal: -Continue Librium taper for alcohol detoxification -Consider gabapentin 300 mg TID at discharge to assist his alcohol dependence with any lingering withdrawal symptoms. -Care management consult for substance abuse rehab resources after discharge  Agitation: -Discontinued Ativan 2 mg every 4 hours PRN agitation--please use another alternative as he is detoxing from alcohol and on Librium alcohol detox protocol  -Recommend Zyprexa 5 mg BID PRN agitation  Disposition: Patient does not meet criteria for psychiatric inpatient  admission. Supportive therapy provided about ongoing stressors. Discussed crisis plan, support from social network, calling 911, coming to the Emergency Department, and calling Suicide Hotline. Patient would like to reconsider residential reahabilitation services.  Appreciate social work support in assisting patient with long-term alcohol use rehabilitation program.  Plan for discharge patient to substance use treatment program when medically stable and at decreased risk of withdrawal seizures/delirium tremens-per Dr Viviano Simas, in agreement by this NP  Nanine Means, NP 09/06/2018 12:49 PM

## 2018-09-06 NOTE — Progress Notes (Signed)
[employer] - [place] [facility]   PATIENT NAME: Kurt SimmerJohn Steward    MR#:  [id number]  DATE OF BIRTH:  04/19/1961  SUBJECTIVE:  CHIEF COMPLAINT:   Chief Complaint  Patient presents with  . Alcohol Intoxication   Came with alcohol intoxication and noted to have some infiltrate on x-ray. No complaint of chest pain due to repeated coughing.  Asking for stronger pain medicines as he has chronic back pain and he was used to have oxycodone every day at home.  Cough and wheezing slightly better than yesterday but still bothersome.  He was able to walk around nurses station.  REVIEW OF SYSTEMS:  CONSTITUTIONAL: No fever, fatigue or weakness.  EYES: No blurred or double vision.  EARS, NOSE, AND THROAT: No tinnitus or ear pain.  RESPIRATORY: No cough, shortness of breath, wheezing or hemoptysis.  CARDIOVASCULAR: No chest pain, orthopnea, edema.  GASTROINTESTINAL: No nausea, vomiting, diarrhea or abdominal pain.  GENITOURINARY: No dysuria, hematuria.  ENDOCRINE: No polyuria, nocturia,  HEMATOLOGY: No anemia, easy bruising or bleeding SKIN: No rash or lesion. MUSCULOSKELETAL: No joint pain or arthritis.   NEUROLOGIC: No tingling, numbness, weakness.  PSYCHIATRY: No anxiety or depression.   ROS  DRUG ALLERGIES:   Allergies  Allergen Reactions  . Nsaids Other (See Comments)    Duodenal ulcers, GI bleeding  . Other Other (See Comments)    Patient states he can't take any "mycin" medications because myasthenia gravis.    VITALS:  Blood pressure 122/84, pulse 73, temperature 98.4 F (36.9 C), resp. rate 18, height 5\' 11"  (1.803 m), weight 85.2 kg, SpO2 98 %.  PHYSICAL EXAMINATION:  GENERAL:  58 y.o.-year-old patient lying in the bed with no acute distress.  EYES: Pupils equal, round, reactive to light and accommodation. No scleral icterus. Extraocular muscles intact.  HEENT: Head atraumatic, normocephalic. Oropharynx and nasopharynx clear.  NECK:  Supple, no jugular  venous distention. No thyroid enlargement, no tenderness.  LUNGS: Normal breath sounds bilaterally, no wheezing, some basal right side crepitation. No use of accessory muscles of respiration.  CARDIOVASCULAR: S1, S2 normal. No murmurs, rubs, or gallops.  ABDOMEN: Soft, nontender, nondistended. Bowel sounds present. No organomegaly or mass.  EXTREMITIES: No pedal edema, cyanosis, or clubbing.  NEUROLOGIC: Cranial nerves II through XII are intact. Muscle strength 4/5 in all extremities. Sensation intact. Gait not checked.  Have some shaking. PSYCHIATRIC: The patient is alert and oriented x 3.  Appears anxious. SKIN: No obvious rash, lesion, or ulcer.   Physical Exam LABORATORY PANEL:   CBC Recent Labs  Lab 09/01/18 2205  WBC 4.9  HGB 12.8*  HCT 40.3  PLT 326   ------------------------------------------------------------------------------------------------------------------  Chemistries  Recent Labs  Lab 09/02/18 0515 09/03/18 0256  NA 140 138  K 3.2* 3.4*  CL 103 104  CO2 26 25  GLUCOSE 157* 122*  BUN 6 6  CREATININE 0.69 0.74  CALCIUM 7.9* 8.7*  MG 1.5* 2.1  AST 31  --   ALT 24  --   ALKPHOS 73  --   BILITOT 0.4  --    ------------------------------------------------------------------------------------------------------------------  Cardiac Enzymes No results for input(s): TROPONINI in the last 168 hours. ------------------------------------------------------------------------------------------------------------------  RADIOLOGY:  Dg Chest 2 View  Result Date: 09/05/2018 CLINICAL DATA:  Current every day smoker.  Coughing. EXAM: CHEST - 2 VIEW COMPARISON:  September 01, 2018 FINDINGS: No pneumothorax. Mild bibasilar opacities are stable, and better seen on recent CT imaging. The heart, hila, mediastinum, lungs, and pleura are otherwise  unchanged. IMPRESSION: 1. Very mild bibasilar opacities are stable and better seen on previous CT imaging. No interval changes.  Electronically Signed   By: Gerome Sam III M.D   On: 09/05/2018 08:18    ASSESSMENT AND PLAN:   Principal Problem:   Alcohol intoxication (HCC) Active Problems:   Chronic low back pain   EtOH dependence (HCC)   Substance induced mood disorder (HCC)   Alcohol use disorder, severe, dependence (HCC)  1.  Alcohol withdrawal syndrome - Continue thiamine and folic acid - Librium as needed for withdrawal symptoms - Will monitor per CIWA protocol -Appreciated psychiatric help who started on Librium tapering dose over the next few days.  2.  Community-acquired pneumonia-may be aspiration due to chronic alcoholism. - Initial azithromycin and Rocephin given in the emergency room.  Changed to Augmentin.  - DuoNeb every 6 as needed for shortness of breath or cough - Was on oxygen but now on room air. -Still have cough and requesting to stay 1 more day to get some strength back.  3 diabetes mellitus Home medications have been restarted.  Will monitor with Accu-Cheks and treat with sliding scale insulin  4.  History of atrial fibrillation with RVR - Anticoagulant therapy initiated with Lovenox - He is on telemetry monitoring and will be admitted to stepdown for close monitoring and observation  5.  DVT and PPI prophylaxis were initiated.  6.  Chest pain due to repeated coughing and chronic back pain. Increased oxycodone dose to 10 mg every 6 hours as needed.  7.  COPD exacerbation-patient have wheezing.  He was smoker in the past. IV steroid, DuoNeb added.  All the records are reviewed and case discussed with Care Management/Social Workerr. Management plans discussed with the patient, family and they are in agreement.  CODE STATUS: Full code.  TOTAL TIME TAKING CARE OF THIS PATIENT: 35 minutes.    POSSIBLE D/C IN 1-2 DAYS, DEPENDING ON CLINICAL CONDITION. Still have intermittent cough which is bothersome and requesting 1 more day in the hospital.  He should be able to go  tomorrow as we have given him 2 extra days for this issue.  Altamese Dilling M.D on 09/06/2018   Between 7am to 6pm - Pager - [phone]  After 6pm go to [email or web address] - password [email or web address]  [facility]  Office  [phone]  CC: Primary care physician; [name], MD  Note: This dictation was prepared with Dragon dictation along with smaller phrase technology. Any transcriptional errors that result from this process are unintentional.

## 2018-09-07 DIAGNOSIS — F10929 Alcohol use, unspecified with intoxication, unspecified: Secondary | ICD-10-CM

## 2018-09-07 LAB — GLUCOSE, CAPILLARY: Glucose-Capillary: 172 mg/dL — ABNORMAL HIGH (ref 70–99)

## 2018-09-07 MED ORDER — GUAIFENESIN-DM 100-10 MG/5ML PO SYRP: 5.0000 mL | ORAL_SOLUTION | ORAL | 0 refills | Status: DC | PRN

## 2018-09-07 MED ORDER — HYDROXYZINE HCL 25 MG PO TABS
25.0000 mg | ORAL_TABLET | Freq: Three times a day (TID) | ORAL | Status: DC | PRN
  Administered 2018-09-07: 25 mg via ORAL
  Filled 2018-09-07: qty 1

## 2018-09-07 MED ORDER — AMOXICILLIN-POT CLAVULANATE 875-125 MG PO TABS: 1.0000 | ORAL_TABLET | Freq: Two times a day (BID) | ORAL | 0 refills | Status: DC

## 2018-09-07 MED ORDER — OLANZAPINE 5 MG PO TABS
5.0000 mg | ORAL_TABLET | Freq: Two times a day (BID) | ORAL | Status: DC | PRN
  Filled 2018-09-07: qty 1

## 2018-09-07 MED ORDER — HYDROXYZINE HCL 25 MG PO TABS: 25.0000 mg | ORAL_TABLET | Freq: Three times a day (TID) | ORAL | 0 refills | Status: DC | PRN

## 2018-09-07 MED ORDER — TRAZODONE HCL 100 MG PO TABS: 100.0000 mg | ORAL_TABLET | Freq: Every evening | ORAL | Status: DC | PRN

## 2018-09-07 MED ORDER — IPRATROPIUM-ALBUTEROL 0.5-2.5 (3) MG/3ML IN SOLN: 3.0000 mL | Freq: Two times a day (BID) | RESPIRATORY_TRACT | Status: DC

## 2018-09-07 MED ORDER — GABAPENTIN 300 MG PO CAPS
300.0000 mg | ORAL_CAPSULE | Freq: Three times a day (TID) | ORAL | Status: DC
  Administered 2018-09-07: 300 mg via ORAL
  Filled 2018-09-07: qty 1

## 2018-09-07 MED ORDER — ADULT MULTIVITAMIN W/MINERALS CH: 1.0000 | ORAL_TABLET | Freq: Every day | ORAL | 2 refills | Status: AC

## 2018-09-07 MED ORDER — POTASSIUM CHLORIDE CRYS ER 20 MEQ PO TBCR
40.0000 meq | EXTENDED_RELEASE_TABLET | Freq: Once | ORAL | Status: AC
  Administered 2018-09-07: 40 meq via ORAL
  Filled 2018-09-07: qty 2

## 2018-09-07 NOTE — Plan of Care (Signed)
  Problem: Safety: Goal: Ability to remain free from injury will improve Outcome: Progressing   Problem: Health Behavior/Discharge Planning: Goal: Ability to identify changes in lifestyle to reduce recurrence of condition will improve Outcome: Progressing

## 2018-09-07 NOTE — Discharge Summary (Signed)
[facility] [place] [facility]   PATIENT NAME: Kurt Horton    MR#:  [id number]  DATE OF BIRTH:  58-12-1960  DATE OF ADMISSION:  09/01/2018   ADMITTING PHYSICIAN: Delfino Lovett, MD  DATE OF DISCHARGE: 09/07/2018  PRIMARY CARE PHYSICIAN: [name], MD   ADMISSION DIAGNOSIS:  Acute respiratory failure with hypoxia (HCC) [J96.01] Alcoholic intoxication with complication (HCC) [F10.929] DISCHARGE DIAGNOSIS:  Principal Problem:   Alcohol intoxication (HCC) Active Problems:   Chronic low back pain   EtOH dependence (HCC)   Substance induced mood disorder (HCC)   Alcohol use disorder, severe, dependence (HCC)  SECONDARY DIAGNOSIS:   Past Medical History:  Diagnosis Date  . A-fib (HCC)   . Alcohol abuse   . Alcohol withdrawal (HCC) 11/12/2016  . Diabetes mellitus without complication (HCC)   . DJD (degenerative joint disease)   . Hypertension   . Myasthenia gravis (HCC)   . Renal disorder    HOSPITAL COURSE:  1. Alcohol withdrawal syndrome - Continue thiamine and folic acid - Librium as needed for withdrawal symptoms On CIWAprotocol Psychiatric help who started on Librium tapering dose. Start atarax prn for anxiety per Dr. Lenon Ahmadi.  2. Community-acquired pneumonia-may be aspiration due to chronic alcoholism. - Initial azithromycin and Rocephin given in the emergency room.  Changed to Augmentin. - DuoNeb every 6 as needed for shortness of breath or cough - Was on oxygen but now on room air. -Still have cough, continue robitussin prn.  3 diabetes mellitus Home medications have been restarted. stable on Accu-Cheks and treat with sliding scale insulin  4. History of atrial fibrillation with RVR - Anticoagulant therapy initiated with Lovenox Now on lovenox DVT prophylaxis dose. Follow up PCP/cardiologist as outpatient.  5. DVT and PPI prophylaxis were initiated.  6.  Chest pain due to repeated coughing and chronic back pain.  Increased oxycodone dose to 10 mg every 6 hours as needed.  7.  COPD exacerbation-patient have wheezing.  He was smoker in the past. He is treated with IV steroid, DuoNeb. Improved, no need to continue prednisone.  Hypokalemia. KCl. Follow up K with PCP. Hypomagnesemia. Improved with mag. DISCHARGE CONDITIONS:  Stable, discharge to home with HHPT today. CONSULTS OBTAINED:  Treatment Team:  Mariel Craft, MD DRUG ALLERGIES:   Allergies  Allergen Reactions  . Nsaids Other (See Comments)    Duodenal ulcers, GI bleeding  . Other Other (See Comments)    Patient states he can't take any "mycin" medications because myasthenia gravis.   DISCHARGE MEDICATIONS:   Allergies as of 09/07/2018      Reactions   Nsaids Other (See Comments)   Duodenal ulcers, GI bleeding   Other Other (See Comments)   Patient states he can't take any "mycin" medications because myasthenia gravis.      Medication List    STOP taking these medications   LORazepam 1 MG tablet Commonly known as:  Ativan     TAKE these medications   amoxicillin-clavulanate 875-125 MG tablet Commonly known as:  AUGMENTIN Take 1 tablet by mouth every 12 (twelve) hours.   diltiazem 120 MG 24 hr capsule Commonly known as:  CARDIZEM CD Take 1 capsule (120 mg total) by mouth daily.   folic acid 1 MG tablet Commonly known as:  FOLVITE Take 1 tablet (1 mg total) by mouth daily for 30 days. What changed:  Another medication with the same name was removed. Continue taking this medication, and follow the directions you see here.  guaiFENesin-dextromethorphan 100-10 MG/5ML syrup Commonly known as:  ROBITUSSIN DM Take 5 mLs by mouth every 4 (four) hours as needed for cough.   hydrOXYzine 25 MG tablet Commonly known as:  ATARAX/VISTARIL Take 1 tablet (25 mg total) by mouth 3 (three) times daily as needed for anxiety.   metFORMIN 500 MG tablet Commonly known as:  GLUCOPHAGE Take 1 tablet (500 mg total) by mouth daily.    metoprolol succinate 50 MG 24 hr tablet Commonly known as:  TOPROL-XL Take 1 tablet (50 mg total) by mouth daily. Take with or immediately following a meal.   multivitamin with minerals Tabs tablet Take 1 tablet by mouth daily. Start taking on:  Sep 08, 2018   nicotine 14 mg/24hr patch Commonly known as:  NICODERM CQ - dosed in mg/24 hours Place 1 patch (14 mg total) onto the skin daily.   senna-docusate 8.6-50 MG tablet Commonly known as:  Senokot-S Take 1 tablet by mouth at bedtime as needed for mild constipation.   testosterone cypionate 200 MG/ML injection Commonly known as:  DEPOTESTOSTERONE CYPIONATE Inject 200 mg into the muscle every 14 (fourteen) days.   thiamine 100 MG tablet Take 1 tablet (100 mg total) by mouth daily for 30 days.   traZODone 100 MG tablet Commonly known as:  DESYREL Take 1 tablet (100 mg total) by mouth at bedtime.        DISCHARGE INSTRUCTIONS:  See AVS.  If you experience worsening of your admission symptoms, develop shortness of breath, life threatening emergency, suicidal or homicidal thoughts you must seek medical attention immediately by calling 911 or calling your MD immediately  if symptoms less severe.  You Must read complete instructions/literature along with all the possible adverse reactions/side effects for all the Medicines you take and that have been prescribed to you. Take any new Medicines after you have completely understood and accpet all the possible adverse reactions/side effects.   Please note  You were cared for by a hospitalist during your hospital stay. If you have any questions about your discharge medications or the care you received while you were in the hospital after you are discharged, you can call the unit and asked to speak with the hospitalist on call if the hospitalist that took care of you is not available. Once you are discharged, your primary care physician will handle any further medical issues. Please note  that NO REFILLS for any discharge medications will be authorized once you are discharged, as it is imperative that you return to your primary care physician (or establish a relationship with a primary care physician if you do not have one) for your aftercare needs so that they can reassess your need for medications and monitor your lab values.    On the day of Discharge:  VITAL SIGNS:  Blood pressure (!) 127/93, pulse 74, temperature 98.1 F (36.7 C), temperature source Oral, resp. rate 20, height 5\' 11"  (1.803 m), weight 85 kg, SpO2 97 %. PHYSICAL EXAMINATION:  GENERAL:  58 y.o.-year-old patient lying in the bed with no acute distress.  EYES: Pupils equal, round, reactive to light and accommodation. No scleral icterus. Extraocular muscles intact.  HEENT: Head atraumatic, normocephalic. Oropharynx and nasopharynx clear.  NECK:  Supple, no jugular venous distention. No thyroid enlargement, no tenderness.  LUNGS: Normal breath sounds bilaterally, no wheezing, rales,rhonchi or crepitation. No use of accessory muscles of respiration.  CARDIOVASCULAR: S1, S2 normal. No murmurs, rubs, or gallops.  ABDOMEN: Soft, non-tender, non-distended. Bowel sounds present. No  organomegaly or mass.  EXTREMITIES: No pedal edema, cyanosis, or clubbing.  NEUROLOGIC: Cranial nerves II through XII are intact. Muscle strength 5/5 in all extremities. Sensation intact. Gait not checked.  PSYCHIATRIC: The patient is alert and oriented x 3.  SKIN: No obvious rash, lesion, or ulcer.  DATA REVIEW:   CBC Recent Labs  Lab 09/01/18 2205  WBC 4.9  HGB 12.8*  HCT 40.3  PLT 326    Chemistries  Recent Labs  Lab 09/02/18 0515 09/03/18 0256  NA 140 138  K 3.2* 3.4*  CL 103 104  CO2 26 25  GLUCOSE 157* 122*  BUN 6 6  CREATININE 0.69 0.74  CALCIUM 7.9* 8.7*  MG 1.5* 2.1  AST 31  --   ALT 24  --   ALKPHOS 73  --   BILITOT 0.4  --      Microbiology Results  Results for orders placed or performed during the  hospital encounter of 09/01/18  MRSA PCR Screening     Status: None   Collection Time: 09/02/18  3:01 AM  Result Value Ref Range Status   MRSA by PCR NEGATIVE NEGATIVE Final    Comment:        The GeneXpert MRSA Assay (FDA approved for NASAL specimens only), is one component of a comprehensive MRSA colonization surveillance program. It is not intended to diagnose MRSA infection nor to guide or monitor treatment for MRSA infections. Performed at [facility], [address] [place], [place] [address]     RADIOLOGY:  No results found.   Management plans discussed with the patient, family and they are in agreement.  CODE STATUS: Prior   TOTAL TIME TAKING CARE OF THIS PATIENT: 33 minutes.    Shaune PollackQing Danyl Deems M.D on 09/07/2018 at 12:51 PM  Between 7am to 6pm - Pager - [phone]  After 6pm go to [email or web address] - Social research officer, governmentpassword [email or web address] [email or web address]  [facility] [place] [facility]  Office  [phone]  CC: Primary care physician; Barbette ReichmannHande, Vishwanath, MD   Note: This dictation was prepared with Dragon dictation along with smaller phrase technology. Any transcriptional errors that result from this process are unintentional.

## 2018-09-07 NOTE — Discharge Instructions (Signed)
HHPT °

## 2018-09-07 NOTE — Consult Note (Signed)
[facility] Face-to-Face Psychiatry Consult   Reason for Consult:  Psych follow-up Referring Physician:  Patient Identification: Kurt Horton MRN:  [id number] Principal Diagnosis: Alcohol intoxication (HCC) Diagnosis:  Principal Problem:   Alcohol intoxication (HCC) Active Problems:   Chronic low back pain   EtOH dependence (HCC)   Substance induced mood disorder (HCC)   Alcohol use disorder, severe, dependence (HCC)   Total Time spent with patient: 30 minutes  Subjective:   Kurt Horton is a 58 y.o. male patient with a psych history of alcohol use disorder, anxiety, medical history of COPD, a-fib, chronic back pain, DM, who was admitted 5 days ago for acute alcohol intoxication and was found to have a pneumonia. Currently he is on telemetry. Psychiatry initially consulted on 4/30. Patient seen today for psych follow-up.  Last CIWA-score was 2.  Patient seen, chart reviewed. Patient reports feeling anxious due to persistent cough. He is hopeful the breathing treatment will help. Patient reports "okay" mood today. He reports feeling "depressed since my divorce". Reports he was drinking to hell with emotional pain after divorce. He denies any new mental complaints today. He denies having any thoughts of harming self or others. He denies any physical symptoms of alcohol withdrawal. Denies any hallucinations. Reports feeling safe in the hospital. Reports variable sleep, good appetite (eating breakfast at the time of the interview). Reports anxiety and asking to restart his Ativan. He explained that he is on Librium taper based on his objective signs. He was offered to try Hydroxyzine as needed for anxiety instead of benzos; patient states "it does not really work", but agreed to try.   Past Psychiatric History: Depression but mainly a long history of alcohol abuse without any recent ability to stay sober meanwhile with his physical compromise getting worse   Risk to Self:  No Risk to  Others:  No Prior Inpatient Therapy:  yes Prior Outpatient Therapy:  Yes, last at [facility] January 18 through May 30, 2018   Past Medical History:  Past Medical History:  Diagnosis Date  . A-fib (HCC)   . Alcohol abuse   . Alcohol withdrawal (HCC) 11/12/2016  . Diabetes mellitus without complication (HCC)   . DJD (degenerative joint disease)   . Hypertension   . Myasthenia gravis (HCC)   . Renal disorder     Past Surgical History:  Procedure Laterality Date  . AMPUTATION TOE Left 12/29/2015   Procedure: AMPUTATION TOE;  Surgeon: Gwyneth Revels, DPM;  Location: [facility] ORS;  Service: Podiatry;  Laterality: Left;  . ESOPHAGOGASTRODUODENOSCOPY N/A 02/05/2018   Procedure: ESOPHAGOGASTRODUODENOSCOPY (EGD);  Surgeon: Pasty Spillers, MD;  Location: [facility] ENDOSCOPY;  Service: Endoscopy;  Laterality: N/A;  . PERIPHERAL VASCULAR CATHETERIZATION N/A 12/27/2015   Procedure: Lower Extremity Angiography;  Surgeon: Annice Needy, MD;  Location: [facility] INVASIVE CV LAB;  Service: Cardiovascular;  Laterality: N/A;  . right hip surgery    . right shoulder surgery     Family History:  Family History  Problem Relation Age of Onset  . Rheum arthritis Father    Family Psychiatric  History: N/A  Social History:  Per initial consult note 09/05/18 by Dr Viviano Simas: Lives with parents who are in their 12s and enable his alcoholism and unemployment due to his back pain. Father has rheumatoid arthritis and mother has cardiac condition. Currently unemployed, on disability Patient reports that he does save most of his disability check, but spends at least $500 of each on alcohol. He states that  his parents have discussed taking his money from him, but have not done so to this point. Patient has 2 siblings with whom he has little contact with.   Social History   Substance and Sexual Activity  Alcohol Use Yes  . Alcohol/week: 20.0 - 30.0 standard drinks  . Types: 20 - 30 Shots of  liquor per week   Comment: Last drink 3 days ago 06/22/2018- rum     Social History   Substance and Sexual Activity  Drug Use Yes  . Types: Oxycodone    Social History   Socioeconomic History  . Marital status: Divorced    Spouse name: Not on file  . Number of children: Not on file  . Years of education: Not on file  . Highest education level: Not on file  Occupational History  . Not on file  Social Needs  . Financial resource strain: Not on file  . Food insecurity:    Worry: Not on file    Inability: Not on file  . Transportation needs:    Medical: Not on file    Non-medical: Not on file  Tobacco Use  . Smoking status: Current Every Day Smoker    Packs/day: 0.50    Types: Cigarettes  . Smokeless tobacco: Never Used  Substance and Sexual Activity  . Alcohol use: Yes    Alcohol/week: 20.0 - 30.0 standard drinks    Types: 20 - 30 Shots of liquor per week    Comment: Last drink 3 days ago 06/22/2018- rum  . Drug use: Yes    Types: Oxycodone  . Sexual activity: Not on file  Lifestyle  . Physical activity:    Days per week: Not on file    Minutes per session: Not on file  . Stress: Not on file  Relationships  . Social connections:    Talks on phone: Not on file    Gets together: Not on file    Attends religious service: Not on file    Active member of club or organization: Not on file    Attends meetings of clubs or organizations: Not on file    Relationship status: Not on file  Other Topics Concern  . Not on file  Social History Narrative   Live with Parents, ambulates with a cane. Falls lately.   Additional Social History:    Allergies:   Allergies  Allergen Reactions  . Nsaids Other (See Comments)    Duodenal ulcers, GI bleeding  . Other Other (See Comments)    Patient states he can't take any "mycin" medications because myasthenia gravis.    Labs:  Results for orders placed or performed during the hospital encounter of 09/01/18 (from the past 48  hour(s))  Glucose, capillary     Status: Abnormal   Collection Time: 09/05/18  9:03 AM  Result Value Ref Range   Glucose-Capillary 174 (H) 70 - 99 mg/dL  Glucose, capillary     Status: Abnormal   Collection Time: 09/05/18 11:26 AM  Result Value Ref Range   Glucose-Capillary 117 (H) 70 - 99 mg/dL  Glucose, capillary     Status: Abnormal   Collection Time: 09/05/18  5:34 PM  Result Value Ref Range   Glucose-Capillary 165 (H) 70 - 99 mg/dL  Glucose, capillary     Status: Abnormal   Collection Time: 09/05/18  9:24 PM  Result Value Ref Range   Glucose-Capillary 217 (H) 70 - 99 mg/dL  Glucose, capillary     Status: Abnormal  Collection Time: 09/06/18  8:04 AM  Result Value Ref Range   Glucose-Capillary 157 (H) 70 - 99 mg/dL  Glucose, capillary     Status: Abnormal   Collection Time: 09/06/18 11:39 AM  Result Value Ref Range   Glucose-Capillary 127 (H) 70 - 99 mg/dL  Glucose, capillary     Status: Abnormal   Collection Time: 09/06/18  4:57 PM  Result Value Ref Range   Glucose-Capillary 133 (H) 70 - 99 mg/dL  Glucose, capillary     Status: Abnormal   Collection Time: 09/06/18  9:26 PM  Result Value Ref Range   Glucose-Capillary 264 (H) 70 - 99 mg/dL  Glucose, capillary     Status: Abnormal   Collection Time: 09/07/18  7:53 AM  Result Value Ref Range   Glucose-Capillary 172 (H) 70 - 99 mg/dL   Comment 1 Notify RN    Comment 2 Document in Chart     Current Facility-Administered Medications  Medication Dose Route Frequency Provider Last Rate Last Dose  . acetaminophen (TYLENOL) tablet 650 mg  650 mg Oral Q6H PRN Tukov-Yual, Magdalene S, NP   650 mg at 09/04/18 1533  . albuterol (PROVENTIL) (2.5 MG/3ML) 0.083% nebulizer solution 2.5 mg  2.5 mg Nebulization Q2H PRN Altamese Dilling, MD   2.5 mg at 09/05/18 1204  . amoxicillin-clavulanate (AUGMENTIN) 875-125 MG per tablet 1 tablet  1 tablet Oral Q12H Altamese Dilling, MD   1 tablet at 09/06/18 2139  . benzonatate  (TESSALON) capsule 100 mg  100 mg Oral TID PRN Tukov-Yual, Magdalene S, NP   100 mg at 09/07/18 0401  . diltiazem (CARDIZEM CD) 24 hr capsule 120 mg  120 mg Oral Daily Seals, Marylene Land H, NP   120 mg at 09/06/18 1610  . enoxaparin (LOVENOX) injection 40 mg  40 mg Subcutaneous Q24H Seals, Angela H, NP   40 mg at 09/06/18 2140  . folic acid (FOLVITE) tablet 1 mg  1 mg Oral Daily Seals, Milas Kocher, NP   1 mg at 09/06/18 9604  . guaiFENesin-dextromethorphan (ROBITUSSIN DM) 100-10 MG/5ML syrup 5 mL  5 mL Oral Q4H PRN Altamese Dilling, MD   5 mL at 09/05/18 1811  . insulin aspart (novoLOG) injection 0-15 Units  0-15 Units Subcutaneous TID WC Seals, Milas Kocher, NP   2 Units at 09/06/18 1704  . insulin aspart (novoLOG) injection 0-5 Units  0-5 Units Subcutaneous QHS Pearletha Alfred, NP   3 Units at 09/06/18 2149  . ipratropium-albuterol (DUONEB) 0.5-2.5 (3) MG/3ML nebulizer solution 3 mL  3 mL Nebulization TID Altamese Dilling, MD   3 mL at 09/07/18 0755  . magnesium sulfate IVPB 2 g 50 mL  2 g Intravenous Once Maxcine Ham M, RPH      . metFORMIN (GLUCOPHAGE) tablet 500 mg  500 mg Oral Q breakfast Seals, Marylene Land H, NP   500 mg at 09/06/18 5409  . multivitamin with minerals tablet 1 tablet  1 tablet Oral Daily Seals, Milas Kocher, NP   1 tablet at 09/06/18 [phone]  . nicotine (NICODERM CQ - dosed in mg/24 hours) patch 14 mg  14 mg Transdermal Daily Seals, Angela H, NP   14 mg at 09/06/18 0835  . oxyCODONE-acetaminophen (PERCOCET/ROXICET) 5-325 MG per tablet 1-2 tablet  1-2 tablet Oral Q6H PRN Altamese Dilling, MD   2 tablet at 09/07/18 0401  . potassium chloride SA (K-DUR) CR tablet 40 mEq  40 mEq Oral Once Shaune Pollack, MD      . propranolol Bevely Palmer)  tablet 10 mg  10 mg Oral TID Mariel Craft, MD   10 mg at 09/06/18 2149  . thiamine (VITAMIN B-1) tablet 100 mg  100 mg Oral Daily Seals, Marylene Land H, NP   100 mg at 09/06/18 1191  . traZODone (DESYREL) tablet 100 mg  100 mg Oral QHS Seals, Angela H, NP    100 mg at 09/06/18 2139    Psychiatric Specialty Exam: Physical Exam  ROS  Blood pressure (!) 127/93, pulse 74, temperature 98.1 F (36.7 C), temperature source Oral, resp. rate 20, height  (1.803 m), weight 85 kg, SpO2 97 %.Body mass index is 26.15 kg/m.  General Appearance: Casual and Fairly Groomed  Eye Contact:  Good  Speech:  Normal Rate  Volume:  Normal  Mood:  Anxious  Affect:  Congruent and Constricted  Thought Process:  Coherent, Goal Directed and Linear  Orientation:  Full (Time, Place, and Person)  Thought Content:  Logical  Suicidal Thoughts:  No  Homicidal Thoughts:  No  Memory:  Immediate;   Fair Recent;   Fair Remote;   Fair  Judgement:  Fair  Insight:  Fair  Psychomotor Activity:  Normal  Concentration:  Concentration: Fair and Attention Span: Fair  Recall:  [employer] of Knowledge:  Fair  Language:  Fair  Akathisia:  No  Handed:  Right  AIMS (if indicated):     Assets:  Desire for Improvement Housing  ADL's:  Intact  Cognition:  WNL  Sleep:        Treatment Plan Summary: Daily contact with patient to assess and evaluate symptoms and progress in treatment  Disposition: No evidence of imminent risk to self or others at present.     ASSESSMENT: 58 y.o. male patient with a psych history of alcohol use disorder, anxiety, medical history of COPD, a-fib, chronic back pain, DM, who was admitted 5 days ago for acute alcohol intoxication and was found to have a pneumonia. Currently he is on telemetry. Psychiatry initially consulted on 4/30. Patient seen today for psych follow-up. No acute mood changes, no worsening of psych symptoms. Patient reports anxiety in settings of current physical symptoms (cough), denies any thoughs of harming self or others, denies AVH, no symptoms of acute alcohol withdrawal. CIWA was 2 from last night.  Impression: Alcohol use disorder, severe. Unspecified anxiety disorder.  Recommendations: -No need for inpatient  psychiatric hospitalization. -Continue CIWA protocol with Librium taper -Contimnue thiamine  po qday, folate  po qday, and a multivitamin qday. -continue Zyprexa  PO BID PRN agitation and Trazodone  PO QHS PRN sleep; -can add Hydroxyzine  PO TID PRN anxiety -can add on Gabapentin  PO TID for seizure proph, pain and off-label for anxiety; -SW to provide outpatient chemical-dependency resources.    Thalia Party, MD 09/07/2018 8:56 AM

## 2018-09-10 ENCOUNTER — Encounter: Payer: Self-pay | Admitting: Medical Oncology

## 2018-09-10 ENCOUNTER — Other Ambulatory Visit: Payer: Self-pay

## 2018-09-10 ENCOUNTER — Emergency Department
Admission: EM | Admit: 2018-09-10 | Discharge: 2018-09-10 | Disposition: A | Discharge status: Home / Self Care | Payer: Medicare Other | Source: Home / Self Care | Attending: Emergency Medicine | Admitting: Emergency Medicine

## 2018-09-10 ENCOUNTER — Emergency Department: Payer: Medicare Other

## 2018-09-10 ENCOUNTER — Encounter: Payer: Self-pay | Admitting: *Deleted

## 2018-09-10 ENCOUNTER — Emergency Department
Admission: EM | Admit: 2018-09-10 | Discharge: 2018-09-10 | Disposition: A | Discharge status: Home / Self Care | Payer: Medicare Other | Attending: Emergency Medicine | Admitting: Emergency Medicine

## 2018-09-10 DIAGNOSIS — Z79899 Other long term (current) drug therapy: Secondary | ICD-10-CM | POA: Insufficient documentation

## 2018-09-10 DIAGNOSIS — Y908 Blood alcohol level of 240 mg/100 ml or more: Secondary | ICD-10-CM | POA: Insufficient documentation

## 2018-09-10 DIAGNOSIS — R0602 Shortness of breath: Secondary | ICD-10-CM | POA: Diagnosis present

## 2018-09-10 DIAGNOSIS — Z789 Other specified health status: Secondary | ICD-10-CM

## 2018-09-10 DIAGNOSIS — F17210 Nicotine dependence, cigarettes, uncomplicated: Secondary | ICD-10-CM | POA: Insufficient documentation

## 2018-09-10 DIAGNOSIS — E119 Type 2 diabetes mellitus without complications: Secondary | ICD-10-CM | POA: Insufficient documentation

## 2018-09-10 DIAGNOSIS — F10120 Alcohol abuse with intoxication, uncomplicated: Secondary | ICD-10-CM | POA: Insufficient documentation

## 2018-09-10 DIAGNOSIS — F1010 Alcohol abuse, uncomplicated: Secondary | ICD-10-CM

## 2018-09-10 DIAGNOSIS — Z7984 Long term (current) use of oral hypoglycemic drugs: Secondary | ICD-10-CM | POA: Insufficient documentation

## 2018-09-10 DIAGNOSIS — I10 Essential (primary) hypertension: Secondary | ICD-10-CM | POA: Insufficient documentation

## 2018-09-10 DIAGNOSIS — F10929 Alcohol use, unspecified with intoxication, unspecified: Secondary | ICD-10-CM

## 2018-09-10 DIAGNOSIS — F10920 Alcohol use, unspecified with intoxication, uncomplicated: Secondary | ICD-10-CM

## 2018-09-10 DIAGNOSIS — Z7289 Other problems related to lifestyle: Secondary | ICD-10-CM

## 2018-09-10 LAB — CBC WITH DIFFERENTIAL/PLATELET
Abs Immature Granulocytes: 0.34 10*3/uL — ABNORMAL HIGH (ref 0.00–0.07)
Abs Immature Granulocytes: 0.37 10*3/uL — ABNORMAL HIGH (ref 0.00–0.07)
Basophils Absolute: 0.1 10*3/uL (ref 0.0–0.1)
Basophils Absolute: 0.1 10*3/uL (ref 0.0–0.1)
Basophils Relative: 2 %
Basophils Relative: 1 %
Eosinophils Absolute: 0.1 10*3/uL (ref 0.0–0.5)
Eosinophils Absolute: 0.1 10*3/uL (ref 0.0–0.5)
Eosinophils Relative: 1 %
Eosinophils Relative: 1 %
HCT: 41.5 % (ref 39.0–52.0)
HCT: 42.5 % (ref 39.0–52.0)
Hemoglobin: 12.9 g/dL — ABNORMAL LOW (ref 13.0–17.0)
Hemoglobin: 13.6 g/dL (ref 13.0–17.0)
Immature Granulocytes: 4 %
Immature Granulocytes: 4 %
Lymphocytes Relative: 39 %
Lymphocytes Relative: 38 %
Lymphs Abs: 3.6 10*3/uL (ref 0.7–4.0)
Lymphs Abs: 4 10*3/uL (ref 0.7–4.0)
MCH: 28.7 pg (ref 26.0–34.0)
MCH: 29.3 pg (ref 26.0–34.0)
MCHC: 31.1 g/dL (ref 30.0–36.0)
MCHC: 32 g/dL (ref 30.0–36.0)
MCV: 92.4 fL (ref 80.0–100.0)
MCV: 91.6 fL (ref 80.0–100.0)
Monocytes Absolute: 1.1 10*3/uL — ABNORMAL HIGH (ref 0.1–1.0)
Monocytes Absolute: 1.3 10*3/uL — ABNORMAL HIGH (ref 0.1–1.0)
Monocytes Relative: 12 %
Monocytes Relative: 13 %
Neutro Abs: 3.9 10*3/uL (ref 1.7–7.7)
Neutro Abs: 4.7 10*3/uL (ref 1.7–7.7)
Neutrophils Relative %: 42 %
Neutrophils Relative %: 43 %
Platelets: 290 10*3/uL (ref 150–400)
Platelets: 271 10*3/uL (ref 150–400)
RBC: 4.49 MIL/uL (ref 4.22–5.81)
RBC: 4.64 MIL/uL (ref 4.22–5.81)
RDW: 19.3 % — ABNORMAL HIGH (ref 11.5–15.5)
RDW: 19.2 % — ABNORMAL HIGH (ref 11.5–15.5)
WBC: 9.2 10*3/uL (ref 4.0–10.5)
WBC: 10.6 10*3/uL — ABNORMAL HIGH (ref 4.0–10.5)
nRBC: 0.2 % (ref 0.0–0.2)
nRBC: 0 % (ref 0.0–0.2)

## 2018-09-10 LAB — COMPREHENSIVE METABOLIC PANEL
ALT: 18 U/L (ref 0–44)
ALT: 20 U/L (ref 0–44)
AST: 22 U/L (ref 15–41)
AST: 28 U/L (ref 15–41)
Albumin: 3.8 g/dL (ref 3.5–5.0)
Albumin: 3.8 g/dL (ref 3.5–5.0)
Alkaline Phosphatase: 62 U/L (ref 38–126)
Alkaline Phosphatase: 72 U/L (ref 38–126)
Anion gap: 13 (ref 5–15)
Anion gap: 13 (ref 5–15)
BUN: 9 mg/dL (ref 6–20)
BUN: 7 mg/dL (ref 6–20)
CO2: 25 mmol/L (ref 22–32)
CO2: 27 mmol/L (ref 22–32)
Calcium: 8.3 mg/dL — ABNORMAL LOW (ref 8.9–10.3)
Calcium: 8.5 mg/dL — ABNORMAL LOW (ref 8.9–10.3)
Chloride: 102 mmol/L (ref 98–111)
Chloride: 100 mmol/L (ref 98–111)
Creatinine, Ser: 0.6 mg/dL — ABNORMAL LOW (ref 0.61–1.24)
Creatinine, Ser: 0.55 mg/dL — ABNORMAL LOW (ref 0.61–1.24)
GFR calc Af Amer: >60 mL/min (ref >60)
GFR calc Af Amer: >60 mL/min (ref >60)
GFR calc non Af Amer: >60 mL/min (ref >60)
GFR calc non Af Amer: >60 mL/min (ref >60)
Glucose, Bld: 106 mg/dL — ABNORMAL HIGH (ref 70–99)
Glucose, Bld: 117 mg/dL — ABNORMAL HIGH (ref 70–99)
Potassium: 3.8 mmol/L (ref 3.5–5.1)
Potassium: 4.5 mmol/L (ref 3.5–5.1)
Sodium: 140 mmol/L (ref 135–145)
Sodium: 140 mmol/L (ref 135–145)
Total Bilirubin: 0.4 mg/dL (ref 0.3–1.2)
Total Bilirubin: 0.8 mg/dL (ref 0.3–1.2)
Total Protein: 6.7 g/dL (ref 6.5–8.1)
Total Protein: 7.2 g/dL (ref 6.5–8.1)

## 2018-09-10 LAB — ETHANOL
Alcohol, Ethyl (B): 270 mg/dL — ABNORMAL HIGH (ref <10)
Alcohol, Ethyl (B): 309 mg/dL (ref <10)

## 2018-09-10 MED ORDER — THIAMINE HCL 100 MG/ML IJ SOLN
100.0000 mg | Freq: Once | INTRAMUSCULAR | Status: AC
  Administered 2018-09-10: 03:00:00 100 mg via INTRAVENOUS
  Filled 2018-09-10: qty 2

## 2018-09-10 MED ORDER — LORAZEPAM 2 MG PO TABS
2.0000 mg | ORAL_TABLET | Freq: Once | ORAL | Status: AC
  Administered 2018-09-10: 2 mg via ORAL
  Filled 2018-09-10: qty 1

## 2018-09-10 MED ORDER — ACETAMINOPHEN 500 MG PO TABS
1000.0000 mg | ORAL_TABLET | Freq: Once | ORAL | Status: AC
  Administered 2018-09-10: 06:00:00 1000 mg via ORAL
  Filled 2018-09-10: qty 2

## 2018-09-10 NOTE — ED Notes (Signed)
Assumed care of patient patient presents saturated wet and cold. Patient undressed placed in dry gown, condom cath applied. rectal temp 94.4. bear hugger placed on patient. #20g placed to rfa, labs drawn and sent. Patient sating 94% on 2lnc. Safety maintained will monitor.  rectal temp obtained.

## 2018-09-10 NOTE — ED Triage Notes (Signed)
Pt arrives to ED via [facility] with c/o ETOH withdrawal. Pt reports his last ETOH was about "10 hrs ago". Pt also c/o chronic low back pain. Dr Derrill Kay at bedside upon pt's arrival to ED.

## 2018-09-10 NOTE — Discharge Instructions (Addendum)
Follow-up with your regular doctor.  Return emergency department worsening.  Drink plenty of water.  Please decrease the amount of alcohol that you intake.  If you wish to stop drinking alcohol I would advise that you go to a rehab center.  You can get severe complications if you try to withdrawal from it yourself.

## 2018-09-10 NOTE — Discharge Instructions (Addendum)
Please seek medical attention for any high fevers, chest pain, shortness of breath, change in behavior, persistent vomiting, bloody stool or any other new or concerning symptoms.  

## 2018-09-10 NOTE — ED Triage Notes (Signed)
Pt from home- per ems pts parents found him laying "passed out" in the front yard. Pt has history of chronic alcoholism.

## 2018-09-10 NOTE — ED Triage Notes (Signed)
Per EMS call out for a fall. Pt had been drinking per his normal and parents could not find him and found him out in the yard. He was here on 4/26. Pt intoxicated

## 2018-09-10 NOTE — ED Notes (Signed)
Still waiting for cab company to show up and provide transportation home. Pt sleeping comfortably at this time. Equal rise and fall noted to chest; RR even, regular and unlabored. No tremors observed.

## 2018-09-10 NOTE — Discharge Instructions (Signed)
You were seen in the emergency department for alcohol intoxication.  Please seek help from the recommended resources for assistance with your alcohol dependence.  If you have any thoughts of hurting herself or others, please call 911 or return to the emergency department.  Please avoid drug and alcohol use.  Never drive a vehicle or operate machinery while intoxicated.  

## 2018-09-10 NOTE — ED Notes (Signed)
Patient awakens to name, po fluids given. Lunch box provided. Vss. Awaiting disposition.

## 2018-09-10 NOTE — ED Provider Notes (Signed)
[facility] Emergency Department Provider Note  ____________________________________________   First MD Initiated Contact with Patient 09/10/18 1342     (approximate)  I have reviewed the triage vital signs and the nursing notes.   HISTORY  Chief Complaint Alcohol Intoxication    HPI Kurt Horton is a 58 y.o. male presents emergency department via EMS.  EMS states his parents found him laying in the yard "passed out " .  Patient has not responded to EMS or his parents.  Patient was discharged from the ED this morning around 7 AM.  He has a long history of alcoholism.   Past Medical History:  Diagnosis Date  . A-fib ([place])   . Alcohol abuse   . Alcohol withdrawal ([place]) 11/12/2016  . Diabetes mellitus without complication ([place])   . DJD (degenerative joint disease)   . Hypertension   . Myasthenia gravis ([place])   . Renal disorder     Patient Active Problem List   Diagnosis Date Noted  . Alcohol withdrawal ([place]) 08/26/2018  . A-fib ([place]) 08/17/2018  . Tobacco use disorder 05/27/2018  . GI bleed 02/04/2018  . Alcohol intoxication ([place]) 12/22/2017  . Malnutrition of moderate degree 11-12-2017  . Chronic combined systolic and diastolic CHF (congestive heart failure) ([place]) 08/13/2017  . Alcohol withdrawal delirium ([place]) 02/26/2017  . Atrial fibrillation with RVR ([place]) 02/25/2017  . Acute alcoholic intoxication with complication ([place])   . Alcohol use disorder, severe, dependence ([place])   . Substance induced mood disorder ([place]) 11/13/2016  . SVT (supraventricular tachycardia) ([place]) 11/12/2016  . HTN (hypertension) 11/12/2016  . EtOH dependence ([place]) 11/12/2016  . Lumbar radiculopathy 08/24/2016  . Chronic low back pain 01/04/2016  . Pressure ulcer 12/29/2015  . Osteomyelitis ([place]) 12/26/2015  . PVD (peripheral vascular disease) ([place]) 12/26/2015  . Type II diabetes mellitus with manifestations ([place]) 12/26/2015  . Myasthenia gravis ([place])  12/26/2015    Past Surgical History:  Procedure Laterality Date  . AMPUTATION TOE Left 12/29/2015   Procedure: AMPUTATION TOE;  Surgeon: Samara Deist, DPM;  Location: [facility] ORS;  Service: Podiatry;  Laterality: Left;  . ESOPHAGOGASTRODUODENOSCOPY N/A 02/05/2018   Procedure: ESOPHAGOGASTRODUODENOSCOPY (EGD);  Surgeon: Virgel Manifold, MD;  Location: [facility] ENDOSCOPY;  Service: Endoscopy;  Laterality: N/A;  . PERIPHERAL VASCULAR CATHETERIZATION N/A 12/27/2015   Procedure: Lower Extremity Angiography;  Surgeon: Algernon Huxley, MD;  Location: [place] CV LAB;  Service: Cardiovascular;  Laterality: N/A;  . right hip surgery    . right shoulder surgery      Prior to Admission medications   Medication Sig Start Date End Date Taking? Authorizing Provider  amoxicillin-clavulanate (AUGMENTIN) 875-125 MG tablet Take 1 tablet by mouth every 12 (twelve) hours. 09/07/18   Demetrios Loll, MD  diltiazem (CARDIZEM CD) 120 MG 24 hr capsule Take 1 capsule (120 mg total) by mouth daily. 06/29/18   [place]an Basta, MD  folic acid (FOLVITE) 1 MG tablet Take 1 tablet (1 mg total) by mouth daily for 30 days. 08/29/18 09/28/18  Saundra Shelling, MD  guaiFENesin-dextromethorphan (ROBITUSSIN DM) 100-10 MG/5ML syrup Take 5 mLs by mouth every 4 (four) hours as needed for cough. 09/07/18   Demetrios Loll, MD  hydrOXYzine (ATARAX/VISTARIL) 25 MG tablet Take 1 tablet (25 mg total) by mouth 3 (three) times daily as needed for anxiety. 09/07/18   Demetrios Loll, MD  metFORMIN (GLUCOPHAGE) 500 MG tablet Take 1 tablet (500 mg total) by mouth daily. 05/30/18   Clovis Fredrickson, MD  metoprolol succinate (TOPROL-XL) 50 MG 24 hr tablet Take 1 tablet (50 mg total) by mouth daily. Take with or immediately following a meal. 05/30/18   Pucilowska, Jolanta B, MD  Multiple Vitamin (MULTIVITAMIN WITH MINERALS) TABS tablet Take 1 tablet by mouth daily. 09/08/18   Demetrios Loll, MD  nicotine (NICODERM CQ - DOSED IN MG/24 HOURS) 14 mg/24hr patch Place 1  patch (14 mg total) onto the skin daily. 08/29/18   Pyreddy, Reatha Harps, MD  senna-docusate (SENOKOT-S) 8.6-50 MG tablet Take 1 tablet by mouth at bedtime as needed for mild constipation. 12/25/17   Mayo, Pete Pelt, MD  testosterone cypionate (DEPOTESTOSTERONE CYPIONATE) 200 MG/ML injection Inject 200 mg into the muscle every 14 (fourteen) days.    [provider]  thiamine 100 MG tablet Take 1 tablet (100 mg total) by mouth daily for 30 days. 08/29/18 09/28/18  Saundra Shelling, MD  traZODone (DESYREL) 100 MG tablet Take 1 tablet (100 mg total) by mouth at bedtime. 05/30/18   Pucilowska, Wardell Honour, MD    Allergies Nsaids and Other  Family History  Problem Relation Age of Onset  . Rheum arthritis Father     Social History Social History   Tobacco Use  . Smoking status: Current Every Day Smoker    Packs/day: 0.50    Types: Cigarettes  . Smokeless tobacco: Never Used  Substance Use Topics  . Alcohol use: Yes    Alcohol/week: 20.0 - 30.0 standard drinks    Types: 20 - 30 Shots of liquor per week    Comment: Last drink 3 days ago 06/22/2018- rum  . Drug use: Yes    Types: Oxycodone    Review of Systems  Constitutional: No fever/chills, EtOH intoxication, altered mental status Eyes: No visual changes. ENT: No sore throat. Respiratory: Denies cough Genitourinary: Negative for dysuria. Musculoskeletal: Negative for back pain. Skin: Negative for rash.    ____________________________________________   PHYSICAL EXAM:  VITAL SIGNS: ED Triage Vitals  Enc Vitals Group     BP 09/10/18 1352 126/90     Pulse Rate 09/10/18 1352 86     Resp 09/10/18 1352 14     Temp 09/10/18 1355 (!) 94.4 F (34.7 C)     Temp Source 09/10/18 1355 Rectal     SpO2 --      Weight 09/10/18 1353 187 lb 6.3 oz (85 kg)     Height 09/10/18 1353 '5\' 11"'$  (1.803 m)     Head Circumference --      Peak Flow --      Pain Score 09/10/18 1353 0     Pain Loc --      Pain Edu? --      Excl. in [place]? --      Constitutional: Altered mental status.  Patient unresponsive.  Only responds to sternal rub , temp decreased at 94.4  eyes: Conjunctivae are normal.  Head: Atraumatic. Nose: No congestion/rhinnorhea. Mouth/Throat: Mucous membranes are moist.   Neck:  supple no lymphadenopathy noted Cardiovascular: Normal rate, regular rhythm. Heart sounds are normal Respiratory: Normal respiratory effort.  No retractions, lungs c t a  Abd: soft nontender bs normal all 4 quad GU: deferred Musculoskeletal: FROM all extremities, warm and well perfused Neurologic: Patient did respond to sternal rub Skin:  Skin is warm, dry and intact. No rash noted. Psychiatric: Mood and affect are normal. Speech and behavior are normal.  ____________________________________________   LABS (all labs ordered are listed, but only abnormal results are displayed)  Labs Reviewed  ETHANOL  CBC WITH DIFFERENTIAL/PLATELET  COMPREHENSIVE METABOLIC PANEL   ____________________________________________   ____________________________________________  RADIOLOGY    ____________________________________________   PROCEDURES  Procedure(s) performed: Bearhugger applied by nursing staff  Procedures    ____________________________________________   INITIAL IMPRESSION / ASSESSMENT AND PLAN / ED COURSE  Pertinent labs & imaging results that were available during my care of the patient were reviewed by me and considered in my medical decision making (see chart for details).   Patient is 58 year old alcoholic male presents emergency department via EMS.  He was discharged around 7 AM from the ED.  His parents then found him passed out in the front yard.  Patient is unresponsive except to sternal rub.  Rectal temp was 94.4.  Patient was wrapped in the bear hugger.    ----------------------------------------- 4:22 PM on 09/10/2018 -----------------------------------------  CBC and metabolic panel are normal, EtOH level  is 309  Due to his chronic illness of all alcoholism with the patient he will be discharged when he is more alert.  ----------------------------------------- 5:55 PM on 09/10/2018 -----------------------------------------  Patient is awake, eating and drinking.  He was discharged in stable condition with advice to decrease amount of alcohol intake.  Offered him rehab which he does not want at this time.  He was advised to follow-up with a rehab facility if he decides to stop drinking.  As part of my medical decision making, I reviewed the following data within the [place] notes reviewed and incorporated, Labs reviewed CBC met C normal, EtOH level 309, Old chart reviewed, Evaluated by EM attending Dr. Alfred Levins, Notes from prior ED visits and [place] Controlled Substance Database  ____________________________________________   FINAL CLINICAL IMPRESSION(S) / ED DIAGNOSES  Final diagnoses:  Alcoholic intoxication without complication ([place])  Alcohol abuse      NEW MEDICATIONS STARTED DURING THIS VISIT:  New Prescriptions   No medications on file     Note:  This document was prepared using Dragon voice recognition software and may include unintentional dictation errors.    Versie Starks, PA-C 09/10/18 [place], [place], MD 09/12/18 1535

## 2018-09-10 NOTE — ED Notes (Signed)
Repeat labs drawn and sent.

## 2018-09-10 NOTE — ED Notes (Signed)
Paper scrubs provided awaiting his mother whom is on her way to pick him up.

## 2018-09-10 NOTE — ED Notes (Signed)
[employer] called for transportation at d/c. Operator stated it would be a 45-60 mins wait. Pt will be kept in ED 19H until cab arrives to assure pt safety. Charge Rn, Raquel, made aware and agreeable with plan.

## 2018-09-10 NOTE — ED Provider Notes (Signed)
[facility] Emergency Department Provider Note  ____________________________________________   First MD Initiated Contact with Patient 09/10/18 0019     (approximate)  I have reviewed the triage vital signs and the nursing notes.   HISTORY  Chief Complaint Alcohol Intoxication and Shortness of Breath  Level 5 caveat:  history/ROS limited by acute intoxication  HPI Kurt Horton is a 58 y.o. male well-known to the emergency department for frequent visits usually related to his chronic alcoholism, sometimes requiring admission for various complications.  He was last seen here and admitted for acute respiratory failure in the setting of alcohol intoxication and thought to have some mild community-acquired pneumonia.  Apparently he has been at his baseline, drinking heavily every night, and EMS was called because he was passed out in the yard.  There is no known trauma and his family reportedly told the paramedics that he has been drinking like usual.  The patient is somnolent and will wake up briefly when stimulated and then goes back to sleep.  He is currently protecting his airway.  He is not verbalizing any complaints and has no obvious signs of trauma.        Past Medical History:  Diagnosis Date   A-fib [facility](HCC)    Alcohol abuse    Alcohol withdrawal (HCC) 11/12/2016   Diabetes mellitus without complication (HCC)    DJD (degenerative joint disease)    Hypertension    Myasthenia gravis (HCC)    Renal disorder     Patient Active Problem List   Diagnosis Date Noted   Alcohol withdrawal (HCC) 08/26/2018   A-fib (HCC) 08/17/2018   Tobacco use disorder 05/27/2018   GI bleed 02/04/2018   Alcohol intoxication (HCC) 12/22/2017   Malnutrition of moderate degree 10/04/2017   Chronic combined systolic and diastolic CHF (congestive heart failure) (HCC) 08/13/2017   Alcohol withdrawal delirium (HCC) 02/26/2017   Atrial fibrillation with  RVR (HCC) 02/25/2017   Acute alcoholic intoxication with complication (HCC)    Alcohol use disorder, severe, dependence (HCC)    Substance induced mood disorder (HCC) 11/13/2016   SVT (supraventricular tachycardia) (HCC) 11/12/2016   HTN (hypertension) 11/12/2016   EtOH dependence (HCC) 11/12/2016   Lumbar radiculopathy 08/24/2016   Chronic low back pain 01/04/2016   Pressure ulcer 12/29/2015   Osteomyelitis (HCC) 12/26/2015   PVD (peripheral vascular disease) (HCC) 12/26/2015   Type II diabetes mellitus with manifestations (HCC) 12/26/2015   Myasthenia gravis (HCC) 12/26/2015    Past Surgical History:  Procedure Laterality Date   AMPUTATION TOE Left 12/29/2015   Procedure: AMPUTATION TOE;  Surgeon: Gwyneth RevelsJustin Fowler, DPM;  Location: [facility] ORS;  Service: Podiatry;  Laterality: Left;   ESOPHAGOGASTRODUODENOSCOPY N/A 02/05/2018   Procedure: ESOPHAGOGASTRODUODENOSCOPY (EGD);  Surgeon: Pasty Spillersahiliani, Varnita B, MD;  [facility];  Service: Endoscopy;  Laterality: N/A;   PERIPHERAL VASCULAR CATHETERIZATION N/A 12/27/2015   Procedure: Lower Extremity Angiography;  Surgeon: Annice NeedyJason S Dew, MD;  Location: [facility] INVASIVE CV LAB;  Service: Cardiovascular;  Laterality: N/A;   right hip surgery     right shoulder surgery      Prior to Admission medications   Medication Sig Start Date End Date Taking? Authorizing Provider  amoxicillin-clavulanate (AUGMENTIN) 875-125 MG tablet Take 1 tablet by mouth every 12 (twelve) hours. 09/07/18   Shaune Pollackhen, Qing, MD  diltiazem (CARDIZEM CD) 120 MG 24 hr capsule Take 1 capsule (120 mg total) by mouth daily. 06/29/18   Altamese DillingVachhani, Vaibhavkumar, MD  folic acid (FOLVITE) 1 MG  tablet Take 1 tablet (1 mg total) by mouth daily for 30 days. 08/29/18 09/28/18  Ihor Austin, MD  guaiFENesin-dextromethorphan (ROBITUSSIN DM) 100-10 MG/5ML syrup Take 5 mLs by mouth every 4 (four) hours as needed for cough. 09/07/18   Shaune Pollack, MD  hydrOXYzine (ATARAX/VISTARIL) 25 MG  tablet Take 1 tablet (25 mg total) by mouth 3 (three) times daily as needed for anxiety. 09/07/18   Shaune Pollack, MD  metFORMIN (GLUCOPHAGE) 500 MG tablet Take 1 tablet (500 mg total) by mouth daily. 05/30/18   Pucilowska, Braulio Conte B, MD  metoprolol succinate (TOPROL-XL) 50 MG 24 hr tablet Take 1 tablet (50 mg total) by mouth daily. Take with or immediately following a meal. 05/30/18   Pucilowska, Jolanta B, MD  Multiple Vitamin (MULTIVITAMIN WITH MINERALS) TABS tablet Take 1 tablet by mouth daily. 09/08/18   Shaune Pollack, MD  nicotine (NICODERM CQ - DOSED IN MG/24 HOURS) 14 mg/24hr patch Place 1 patch (14 mg total) onto the skin daily. 08/29/18   Pyreddy, Vivien Rota, MD  senna-docusate (SENOKOT-S) 8.6-50 MG tablet Take 1 tablet by mouth at bedtime as needed for mild constipation. 12/25/17   Mayo, Allyn Kenner, MD  testosterone cypionate (DEPOTESTOSTERONE CYPIONATE) 200 MG/ML injection Inject 200 mg into the muscle every 14 (fourteen) days.    [provider]  thiamine 100 MG tablet Take 1 tablet (100 mg total) by mouth daily for 30 days. 08/29/18 09/28/18  Ihor Austin, MD  traZODone (DESYREL) 100 MG tablet Take 1 tablet (100 mg total) by mouth at bedtime. 05/30/18   Pucilowska, Ellin Goodie, MD    Allergies Nsaids and Other  Family History  Problem Relation Age of Onset   Rheum arthritis Father     Social History Social History   Tobacco Use   Smoking status: Current Every Day Smoker    Packs/day: 0.50    Types: Cigarettes   Smokeless tobacco: Never Used  Substance Use Topics   Alcohol use: Yes    Alcohol/week: 20.0 - 30.0 standard drinks    Types: 20 - 30 Shots of liquor per week    Comment: Last drink 3 days ago 06/22/2018- rum   Drug use: Yes    Types: Oxycodone    Review of Systems Level 5 caveat:  history/ROS limited by acute intoxication ____________________________________________   PHYSICAL EXAM:  VITAL SIGNS: ED Triage Vitals  Enc Vitals Group     BP 09/10/18 0013 (!)  162/99     Pulse Rate 09/10/18 0013 (!) 107     Resp 09/10/18 0013 16     Temp 09/10/18 0013 97.8 F (36.6 C)     Temp Source 09/10/18 0013 Oral     SpO2 09/10/18 0013 99 %     Weight 09/10/18 0016 85 kg (187 lb 6.3 oz)     Height 09/10/18 0016 1.803 m ( )     Head Circumference --      Peak Flow --      Pain Score 09/10/18 0014 0     Pain Loc --      Pain Edu? --      Excl. in GC? --     Constitutional: Alert and oriented.  Disheveled but at his baseline. Eyes: Conjunctivae are normal.  Pupils are sluggish but equal bilaterally. Head: Atraumatic. Nose: No congestion/rhinnorhea. Mouth/Throat: Mucous membranes are moist. Neck: No stridor.  No meningeal signs.   Cardiovascular: Borderline tachycardia, regular rhythm. Good peripheral circulation. Grossly normal heart sounds. Respiratory: Normal respiratory effort.  No retractions. No audible wheezing. Gastrointestinal: Soft and nontender. No distention.  Musculoskeletal: No lower extremity tenderness nor edema. No gross deformities of extremities. Neurologic: Moving all 4 extremities at various times, localizes to pain, otherwise unable to participate in neurological exam. Skin:  Skin is warm, dry and intact. No rash noted.   ____________________________________________   LABS (all labs ordered are listed, but only abnormal results are displayed)  Labs Reviewed  COMPREHENSIVE METABOLIC PANEL - Abnormal; Notable for the following components:      Result Value   Glucose, Bld 106 (*)    Creatinine, Ser 0.60 (*)    Calcium 8.3 (*)    All other components within normal limits  CBC WITH DIFFERENTIAL/PLATELET - Abnormal; Notable for the following components:   Hemoglobin 12.9 (*)    RDW 19.3 (*)    Monocytes Absolute 1.1 (*)    Abs Immature Granulocytes 0.34 (*)    All other components within normal limits  ETHANOL - Abnormal; Notable for the following components:   Alcohol, Ethyl (B) 270 (*)    All other components  within normal limits   ____________________________________________  EKG  ED ECG REPORT I, Loleta Rose, the attending physician, personally viewed and interpreted this ECG.  Date: 09/10/2018 EKG Time: 00 26 Rate: 99 Rhythm: normal sinus rhythm QRS Axis: normal Intervals: normal ST/T Wave abnormalities: Non-specific ST segment / T-wave changes, but no clear evidence of acute ischemia. Narrative Interpretation: no definitive evidence of acute ischemia; does not meet STEMI criteria.   ____________________________________________  RADIOLOGY   ED MD interpretation: No evidence of acute abnormality on CT head, cervical spine, nor CT chest.  Official radiology report(s): Ct Head Wo Contrast  Result Date: 09/10/2018 CLINICAL DATA:  58 y/o  M; fall, intoxicated. EXAM: CT HEAD WITHOUT CONTRAST CT CERVICAL SPINE WITHOUT CONTRAST TECHNIQUE: Multidetector CT imaging of the head and cervical spine was performed following the standard protocol without intravenous contrast. Multiplanar CT image reconstructions of the cervical spine were also generated. COMPARISON:  05/17/2018 CT head. 01/19/2018 CT cervical spine. FINDINGS: CT HEAD FINDINGS Brain: No evidence of acute infarction, hemorrhage, hydrocephalus, extra-axial collection or mass lesion/mass effect. Moderate volume loss of the brain. Vascular: No hyperdense vessel or unexpected calcification. Skull: Normal. Negative for fracture or focal lesion. Sinuses/Orbits: No acute finding. Other: None. CT CERVICAL SPINE FINDINGS Alignment: Stable C3-4 and C4-5 grade 1 anterolisthesis as well as reversal of cervical curvature with apex at C5. Skull base and vertebrae: No acute fracture. No primary bone lesion or focal pathologic process. Soft tissues and spinal canal: No prevertebral fluid or swelling. No visible canal hematoma. Disc levels: Advanced spondylosis of the cervical spine with left-greater-than-right facet arthropathy and multilevel discogenic  degenerative changes. No high-grade bony spinal canal stenosis. Uncovertebral and facet hypertrophy encroach on the neural foramen at the bilateral C3-4, left C4-5, bilateral C5-6, left C6-7, and right C7-T1 levels. Upper chest: Negative. Other: Calcific atherosclerosis of the carotid siphons. IMPRESSION: 1. No acute intracranial abnormality or calvarial fracture. 2. No acute fracture or dislocation of the cervical spine. 3. Stable advanced cervical spondylosis, grade 1 anterolisthesis at C3-4 and C4-5 levels, and reversal of cervical curvature. 4. Stable age advanced volume loss of the brain. Electronically Signed   By: Mitzi Hansen M.D.   On: 09/10/2018 01:32   Ct Chest Wo Contrast  Result Date: 09/10/2018 CLINICAL DATA:  Fall.  Acute respiratory illness. EXAM: CT CHEST WITHOUT CONTRAST TECHNIQUE: Multidetector CT imaging of the chest was performed following the  standard protocol without IV contrast. COMPARISON:  Chest x-ray 09/05/2018.  Chest CT 09/01/2018. FINDINGS: Cardiovascular: Heart is normal size. Aorta is normal caliber. Scattered aortic calcifications. Mediastinum/Nodes: No mediastinal, hilar, or axillary adenopathy. Lungs/Pleura: Prominent mediastinal fat which extends into the right minor fissure. Lungs are clear. No focal airspace opacities or suspicious nodules. No effusions. Upper Abdomen: Imaging into the upper abdomen shows no acute findings. Musculoskeletal: Chest wall soft tissues are unremarkable. No acute bony abnormality. IMPRESSION: No acute cardiopulmonary disease. Aortic Atherosclerosis (ICD10-I70.0). Electronically Signed   By: Charlett Nose M.D.   On: 09/10/2018 01:28   Ct Cervical Spine Wo Contrast  Result Date: 09/10/2018 CLINICAL DATA:  58 y/o  M; fall, intoxicated. EXAM: CT HEAD WITHOUT CONTRAST CT CERVICAL SPINE WITHOUT CONTRAST TECHNIQUE: Multidetector CT imaging of the head and cervical spine was performed following the standard protocol without intravenous  contrast. Multiplanar CT image reconstructions of the cervical spine were also generated. COMPARISON:  05/17/2018 CT head. 01/19/2018 CT cervical spine. FINDINGS: CT HEAD FINDINGS Brain: No evidence of acute infarction, hemorrhage, hydrocephalus, extra-axial collection or mass lesion/mass effect. Moderate volume loss of the brain. Vascular: No hyperdense vessel or unexpected calcification. Skull: Normal. Negative for fracture or focal lesion. Sinuses/Orbits: No acute finding. Other: None. CT CERVICAL SPINE FINDINGS Alignment: Stable C3-4 and C4-5 grade 1 anterolisthesis as well as reversal of cervical curvature with apex at C5. Skull base and vertebrae: No acute fracture. No primary bone lesion or focal pathologic process. Soft tissues and spinal canal: No prevertebral fluid or swelling. No visible canal hematoma. Disc levels: Advanced spondylosis of the cervical spine with left-greater-than-right facet arthropathy and multilevel discogenic degenerative changes. No high-grade bony spinal canal stenosis. Uncovertebral and facet hypertrophy encroach on the neural foramen at the bilateral C3-4, left C4-5, bilateral C5-6, left C6-7, and right C7-T1 levels. Upper chest: Negative. Other: Calcific atherosclerosis of the carotid siphons. IMPRESSION: 1. No acute intracranial abnormality or calvarial fracture. 2. No acute fracture or dislocation of the cervical spine. 3. Stable advanced cervical spondylosis, grade 1 anterolisthesis at C3-4 and C4-5 levels, and reversal of cervical curvature. 4. Stable age advanced volume loss of the brain. Electronically Signed   By: Mitzi Hansen M.D.   On: 09/10/2018 01:32    ____________________________________________   PROCEDURES   Procedure(s) performed (including Critical Care):  Procedures   ____________________________________________   INITIAL IMPRESSION / MDM / ASSESSMENT AND PLAN / ED COURSE  As part of my medical decision making, I reviewed the  following data within the electronic MEDICAL RECORD NUMBER Nursing notes reviewed and incorporated, Labs reviewed , EKG interpreted , Old chart reviewed and Notes from prior ED visits      *Ethanael Veith was evaluated in Emergency Department on 09/10/2018 for the symptoms described in the history of present illness. He was evaluated in the context of the global COVID-19 pandemic, which necessitated consideration that the patient might be at risk for infection with the SARS-CoV-2 virus that causes COVID-19. Institutional protocols and algorithms that pertain to the evaluation of patients at risk for COVID-19 are in a state of rapid change based on information released by regulatory bodies including the CDC and federal and state organizations. These policies and algorithms were followed during the patient's care in the ED.*  Differential diagnosis includes, but is not limited to, alcohol intoxication, alcohol withdrawal, seizure, acute head trauma or cervical spine trauma, worsening pneumonia.  He is breathing comfortably and was put on some oxygen only because of his obvious  intoxication which is common for him.  No evidence of acute abnormality on EKG.  I will send off some basic labs and I am checking a CT head, cervical spine, and chest given that we cannot be certain that there was no traumatic injury or head bleed and his recent diagnosis of community-acquired pneumonia; I do not think a chest x-ray will be sufficient.  Anticipate monitoring the patient throughout the night for clinical sobriety and careful monitoring of CIWA protocol.  I am giving thiamine 100 mg IV.  Clinical Course as of Sep 09 556  Tue Sep 10, 2018  0135 No acute abnormalities identified on the head CT, C-spine CT, nor chest CT including no evidence of pneumonia.   [CF]  0236 Alcohol, Ethyl (B)(!): 270 [CF]  0236 CBC and comprehensive metabolic panel are within normal limits.  Monitoring for sobriety.   [CF]  0236 Heart rate has  improved to 88   [CF]  0557 The patient is awake, alert, at his baseline.  Ambulatory without difficulty.  Asking to go home.  Tylenol 1000 mg for headache.  He is going to take an [place] home.  I gave my usual and customary return precautions.   [CF]    Clinical Course User Index [CF] Loleta Rose, MD     ____________________________________________  FINAL CLINICAL IMPRESSION(S) / ED DIAGNOSES  Final diagnoses:  Alcoholic intoxication with complication (HCC)  ETOH abuse     MEDICATIONS GIVEN DURING THIS VISIT:  Medications  acetaminophen (TYLENOL) tablet 1,000 mg (has no administration in time range)  thiamine (B-1) injection 100 mg (100 mg Intravenous Given 09/10/18 0300)     ED Discharge Orders    None       Note:  This document was prepared using Dragon voice recognition software and may include unintentional dictation errors.   Loleta Rose, MD 09/10/18 [id number]

## 2018-09-10 NOTE — ED Provider Notes (Signed)
[facility] Emergency Department Provider Note   ____________________________________________   I have reviewed the triage vital signs and the nursing notes.   HISTORY  Chief Complaint Withdrawal   History limited by: Not Limited   HPI Kurt Horton is a 58 y.o. male who presents to the emergency department today because of concern for alcohol withdrawal. This is the patient's third visit to the emergency department today. He states that he is having shaking. States he drank earlier today.  Records reviewed. Per medical record review patient has a history of frequent ER visits, over 20 in the [facility].   Past Medical History:  Diagnosis Date  . A-fib (HCC)   . Alcohol abuse   . Alcohol withdrawal (HCC) 11/12/2016  . Diabetes mellitus without complication (HCC)   . DJD (degenerative joint disease)   . Hypertension   . Myasthenia gravis (HCC)   . Renal disorder     Patient Active Problem List   Diagnosis Date Noted  . Alcohol withdrawal (HCC) 08/26/2018  . A-fib (HCC) 08/17/2018  . Tobacco use disorder 05/27/2018  . GI bleed 02/04/2018  . Alcohol intoxication (HCC) 12/22/2017  . Malnutrition of moderate degree 01-Nov-202019  . Chronic combined systolic and diastolic CHF (congestive heart failure) (HCC) 08/13/2017  . Alcohol withdrawal delirium (HCC) 02/26/2017  . Atrial fibrillation with RVR (HCC) 02/25/2017  . Acute alcoholic intoxication with complication (HCC)   . Alcohol use disorder, severe, dependence (HCC)   . Substance induced mood disorder (HCC) 11/13/2016  . SVT (supraventricular tachycardia) (HCC) 11/12/2016  . HTN (hypertension) 11/12/2016  . EtOH dependence (HCC) 11/12/2016  . Lumbar radiculopathy 08/24/2016  . Chronic low back pain 01/04/2016  . Pressure ulcer 12/29/2015  . Osteomyelitis (HCC) 12/26/2015  . PVD (peripheral vascular disease) (HCC) 12/26/2015  . Type II diabetes mellitus with  manifestations (HCC) 12/26/2015  . Myasthenia gravis (HCC) 12/26/2015    Past Surgical History:  Procedure Laterality Date  . AMPUTATION TOE Left 12/29/2015   Procedure: AMPUTATION TOE;  Surgeon: Gwyneth Revels, DPM;  Location: [facility] ORS;  Service: Podiatry;  Laterality: Left;  . ESOPHAGOGASTRODUODENOSCOPY N/A 02/05/2018   Procedure: ESOPHAGOGASTRODUODENOSCOPY (EGD);  Surgeon: Pasty Spillers, MD;  Location[facility]COPY;  Service: Endoscopy;  Laterality: N/A;  . PERIPHERAL VASCULAR CATHETERIZATION N/A 12/27/2015   Procedure: Lower Extremity Angiography;  Surgeon: Annice Needy, MD;  Location: [facility] INVASIVE CV LAB;  Service: Cardiovascular;  Laterality: N/A;  . right hip surgery    . right shoulder surgery      Prior to Admission medications   Medication Sig Start Date End Date Taking? Authorizing Provider  amoxicillin-clavulanate (AUGMENTIN) 875-125 MG tablet Take 1 tablet by mouth every 12 (twelve) hours. 09/07/18   Shaune Pollack, MD  diltiazem (CARDIZEM CD) 120 MG 24 hr capsule Take 1 capsule (120 mg total) by mouth daily. 06/29/18   Altamese Dilling, MD  folic acid (FOLVITE) 1 MG tablet Take 1 tablet (1 mg total) by mouth daily for 30 days. 08/29/18 09/28/18  Ihor Austin, MD  guaiFENesin-dextromethorphan (ROBITUSSIN DM) 100-10 MG/5ML syrup Take 5 mLs by mouth every 4 (four) hours as needed for cough. 09/07/18   Shaune Pollack, MD  hydrOXYzine (ATARAX/VISTARIL) 25 MG tablet Take 1 tablet (25 mg total) by mouth 3 (three) times daily as needed for anxiety. 09/07/18   Shaune Pollack, MD  metFORMIN (GLUCOPHAGE) 500 MG tablet Take 1 tablet (500 mg total) by mouth daily. 05/30/18   Pucilowska, Jolanta B,  MD  metoprolol succinate (TOPROL-XL) 50 MG 24 hr tablet Take 1 tablet (50 mg total) by mouth daily. Take with or immediately following a meal. 05/30/18   Pucilowska, Jolanta B, MD  Multiple Vitamin (MULTIVITAMIN WITH MINERALS) TABS tablet Take 1 tablet by mouth daily. 09/08/18   Shaune Pollackhen, Qing, MD  nicotine  (NICODERM CQ - DOSED IN MG/24 HOURS) 14 mg/24hr patch Place 1 patch (14 mg total) onto the skin daily. 08/29/18   Pyreddy, Vivien RotaPavan, MD  senna-docusate (SENOKOT-S) 8.6-50 MG tablet Take 1 tablet by mouth at bedtime as needed for mild constipation. 12/25/17   Mayo, Allyn KennerKaty Dodd, MD  testosterone cypionate (DEPOTESTOSTERONE CYPIONATE) 200 MG/ML injection Inject 200 mg into the muscle every 14 (fourteen) days.    [provider]  thiamine 100 MG tablet Take 1 tablet (100 mg total) by mouth daily for 30 days. 08/29/18 09/28/18  Ihor AustinPyreddy, Pavan, MD  traZODone (DESYREL) 100 MG tablet Take 1 tablet (100 mg total) by mouth at bedtime. 05/30/18   Pucilowska, Ellin GoodieJolanta B, MD    Allergies Nsaids and Other  Family History  Problem Relation Age of Onset  . Rheum arthritis Father     Social History Social History   Tobacco Use  . Smoking status: Current Every Day Smoker    Packs/day: 0.50    Types: Cigarettes  . Smokeless tobacco: Never Used  Substance Use Topics  . Alcohol use: Yes    Alcohol/week: 20.0 - 30.0 standard drinks    Types: 20 - 30 Shots of liquor per week    Comment: Last drink 3 days ago 06/22/2018- rum  . Drug use: Yes    Types: Oxycodone    Review of Systems Constitutional: No fever/chills Eyes: No visual changes. ENT: No sore throat. Cardiovascular: Denies chest pain. Respiratory: Denies shortness of breath. Gastrointestinal: No abdominal pain.  No nausea, no vomiting.  No diarrhea.   Genitourinary: Negative for dysuria. Musculoskeletal: Negative for back pain. Skin: Negative for rash. Neurological: Positive for shaking.   ____________________________________________   PHYSICAL EXAM:  VITAL SIGNS: ED Triage Vitals  Enc Vitals Group     BP 09/10/18 2203 (!) 154/89     Pulse Rate 09/10/18 2203 (!) 144     Resp 09/10/18 2203 18     Temp 09/10/18 2203 98.6 F (37 C)     Temp Source 09/10/18 2203 Oral     SpO2 09/10/18 2203 99 %     Weight 09/10/18 2204 185 lb  (83.9 kg)     Height 09/10/18 2204 5\' 11"  (1.803 m)     Head Circumference --      Peak Flow --      Pain Score 09/10/18 2203 8   Constitutional: Alert and oriented.  Eyes: Conjunctivae are normal.  ENT      Head: Normocephalic and atraumatic.      Nose: No congestion/rhinnorhea.      Mouth/Throat: Mucous membranes are moist.      Neck: No stridor. Hematological/Lymphatic/Immunilogical: No cervical lymphadenopathy. Cardiovascular: tachycardic,  regular rhythm.  No murmurs, rubs, or gallops. Respiratory: Normal respiratory effort without tachypnea nor retractions. Breath sounds are clear and equal bilaterally. No wheezes/rales/rhonchi. Gastrointestinal: Soft and non tender. No rebound. No guarding.  Genitourinary: Deferred Musculoskeletal: Normal range of motion in all extremities. No lower extremity edema. Neurologic:  Normal speech and language. No gross focal neurologic deficits are appreciated. No tremors noted Skin:  Skin is warm, dry and intact. No rash noted. Psychiatric: Mood and affect are normal.  Speech and behavior are normal. Patient exhibits appropriate insight and judgment.  ____________________________________________    LABS (pertinent positives/negatives)  None  ____________________________________________   EKG  None  ____________________________________________    RADIOLOGY  None ____________________________________________   PROCEDURES  Procedures  ____________________________________________   INITIAL IMPRESSION / ASSESSMENT AND PLAN / ED COURSE  Pertinent labs & imaging results that were available during my care of the patient were reviewed by me and considered in my medical decision making (see chart for details).   Patient presents to the emergency department today because of concerns for withdrawal.  This is the patient's third visit to the emergency department today.  Patient does have a well-known history of alcohol abuse.  Patient  states that he last drank today.  On exam patient is awake and alert.  No obvious tremors.  Patient's heart rate was slightly elevated however I think this is related to the patient's alcohol use.  I did discussion with the patient.  At this point do not feel that he requires hospitalization for withdrawal.  Did offer Librium as a prescription.  ____________________________________________   FINAL CLINICAL IMPRESSION(S) / ED DIAGNOSES  Final diagnoses:  Alcohol use     Note: This dictation was prepared with Dragon dictation. Any transcriptional errors that result from this process are unintentional     Phineas Semen, MD 09/11/18 1537

## 2018-09-13 ENCOUNTER — Emergency Department
Admission: EM | Admit: 2018-09-13 | Discharge: 2018-09-14 | Disposition: A | Discharge status: Home / Self Care | Payer: Medicare Other | Source: Home / Self Care | Attending: Emergency Medicine | Admitting: Emergency Medicine

## 2018-09-13 ENCOUNTER — Other Ambulatory Visit: Payer: Self-pay

## 2018-09-13 ENCOUNTER — Encounter: Payer: Self-pay | Admitting: Emergency Medicine

## 2018-09-13 DIAGNOSIS — I5042 Chronic combined systolic (congestive) and diastolic (congestive) heart failure: Secondary | ICD-10-CM | POA: Insufficient documentation

## 2018-09-13 DIAGNOSIS — Z886 Allergy status to analgesic agent status: Secondary | ICD-10-CM | POA: Diagnosis not present

## 2018-09-13 DIAGNOSIS — Z881 Allergy status to other antibiotic agents status: Secondary | ICD-10-CM | POA: Diagnosis not present

## 2018-09-13 DIAGNOSIS — M5416 Radiculopathy, lumbar region: Secondary | ICD-10-CM | POA: Diagnosis not present

## 2018-09-13 DIAGNOSIS — G7000 Myasthenia gravis without (acute) exacerbation: Secondary | ICD-10-CM | POA: Diagnosis not present

## 2018-09-13 DIAGNOSIS — I110 Hypertensive heart disease with heart failure: Secondary | ICD-10-CM | POA: Insufficient documentation

## 2018-09-13 DIAGNOSIS — Z89422 Acquired absence of other left toe(s): Secondary | ICD-10-CM | POA: Insufficient documentation

## 2018-09-13 DIAGNOSIS — Z7984 Long term (current) use of oral hypoglycemic drugs: Secondary | ICD-10-CM | POA: Insufficient documentation

## 2018-09-13 DIAGNOSIS — F17210 Nicotine dependence, cigarettes, uncomplicated: Secondary | ICD-10-CM | POA: Insufficient documentation

## 2018-09-13 DIAGNOSIS — M1990 Unspecified osteoarthritis, unspecified site: Secondary | ICD-10-CM | POA: Diagnosis not present

## 2018-09-13 DIAGNOSIS — F10229 Alcohol dependence with intoxication, unspecified: Secondary | ICD-10-CM

## 2018-09-13 DIAGNOSIS — F10239 Alcohol dependence with withdrawal, unspecified: Secondary | ICD-10-CM | POA: Diagnosis not present

## 2018-09-13 DIAGNOSIS — Z8261 Family history of arthritis: Secondary | ICD-10-CM | POA: Diagnosis not present

## 2018-09-13 DIAGNOSIS — Z20828 Contact with and (suspected) exposure to other viral communicable diseases: Secondary | ICD-10-CM | POA: Diagnosis not present

## 2018-09-13 DIAGNOSIS — F1010 Alcohol abuse, uncomplicated: Secondary | ICD-10-CM | POA: Diagnosis present

## 2018-09-13 DIAGNOSIS — E876 Hypokalemia: Secondary | ICD-10-CM | POA: Diagnosis not present

## 2018-09-13 DIAGNOSIS — E119 Type 2 diabetes mellitus without complications: Secondary | ICD-10-CM | POA: Insufficient documentation

## 2018-09-13 DIAGNOSIS — Z79899 Other long term (current) drug therapy: Secondary | ICD-10-CM | POA: Insufficient documentation

## 2018-09-13 DIAGNOSIS — E1151 Type 2 diabetes mellitus with diabetic peripheral angiopathy without gangrene: Secondary | ICD-10-CM | POA: Diagnosis not present

## 2018-09-13 DIAGNOSIS — I480 Paroxysmal atrial fibrillation: Secondary | ICD-10-CM | POA: Diagnosis not present

## 2018-09-13 NOTE — ED Provider Notes (Signed)
[facility] Emergency Department Provider Note  ____________________________________________  Time seen: Approximately 6:48 PM  I have reviewed the triage vital signs and the nursing notes.   HISTORY  Chief Complaint Alcohol Intoxication   HPI Kurt Horton is a 58 y.o. male with a history of alcohol abuse well-known to this emergency department who presents for alcohol intoxication.  Patient reports drinking 20 airplane bottles of liquor and calling 911 because of "alcohol poisoning". This is his 4th visit this week for same.  Patient denies any chest pain or shortness of breath. No trauma  Past Medical History:  Diagnosis Date  . A-fib (HCC)   . Alcohol abuse   . Alcohol withdrawal (HCC) 11/12/2016  . Diabetes mellitus without complication (HCC)   . DJD (degenerative joint disease)   . Hypertension   . Myasthenia gravis (HCC)   . Renal disorder     Patient Active Problem List   Diagnosis Date Noted  . Alcohol withdrawal (HCC) 08/26/2018  . A-fib (HCC) 08/17/2018  . Tobacco use disorder 05/27/2018  . GI bleed 02/04/2018  . Alcohol intoxication (HCC) 12/22/2017  . Malnutrition of moderate degree 01-15-2018  . Chronic combined systolic and diastolic CHF (congestive heart failure) (HCC) 08/13/2017  . Alcohol withdrawal delirium (HCC) 02/26/2017  . Atrial fibrillation with RVR (HCC) 02/25/2017  . Acute alcoholic intoxication with complication (HCC)   . Alcohol use disorder, severe, dependence (HCC)   . Substance induced mood disorder (HCC) 11/13/2016  . SVT (supraventricular tachycardia) (HCC) 11/12/2016  . HTN (hypertension) 11/12/2016  . EtOH dependence (HCC) 11/12/2016  . Lumbar radiculopathy 08/24/2016  . Chronic low back pain 01/04/2016  . Pressure ulcer 12/29/2015  . Osteomyelitis (HCC) 12/26/2015  . PVD (peripheral vascular disease) (HCC) 12/26/2015  . Type II diabetes mellitus with manifestations (HCC) 12/26/2015  . Myasthenia  gravis (HCC) 12/26/2015    Past Surgical History:  Procedure Laterality Date  . AMPUTATION TOE Left 12/29/2015   Procedure: AMPUTATION TOE;  Surgeon: Gwyneth RevelsJustin Fowler, DPM;  Location: [facility] ORS;  Service: Podiatry;  Laterality: Left;  . ESOPHAGOGASTRODUODENOSCOPY N/A 02/05/2018   Procedure: ESOPHAGOGASTRODUODENOSCOPY (EGD);  Surgeon: Pasty Spillersahiliani, Varnita B, MD;  [facility];  Service: Endoscopy;  Laterality: N/A;  . PERIPHERAL VASCULAR CATHETERIZATION N/A 12/27/2015   Procedure: Lower Extremity Angiography;  Surgeon: Annice NeedyJason S Dew, MD;  Location: [facility] INVASIVE CV LAB;  Service: Cardiovascular;  Laterality: N/A;  . right hip surgery    . right shoulder surgery      Prior to Admission medications   Medication Sig Start Date End Date Taking? Authorizing Provider  amoxicillin-clavulanate (AUGMENTIN) 875-125 MG tablet Take 1 tablet by mouth every 12 (twelve) hours. 09/07/18   Shaune Pollackhen, Qing, MD  diltiazem (CARDIZEM CD) 120 MG 24 hr capsule Take 1 capsule (120 mg total) by mouth daily. 06/29/18   Altamese DillingVachhani, Vaibhavkumar, MD  folic acid (FOLVITE) 1 MG tablet Take 1 tablet (1 mg total) by mouth daily for 30 days. 08/29/18 09/28/18  Ihor AustinPyreddy, Pavan, MD  guaiFENesin-dextromethorphan (ROBITUSSIN DM) 100-10 MG/5ML syrup Take 5 mLs by mouth every 4 (four) hours as needed for cough. 09/07/18   Shaune Pollackhen, Qing, MD  hydrOXYzine (ATARAX/VISTARIL) 25 MG tablet Take 1 tablet (25 mg total) by mouth 3 (three) times daily as needed for anxiety. 09/07/18   Shaune Pollackhen, Qing, MD  metFORMIN (GLUCOPHAGE) 500 MG tablet Take 1 tablet (500 mg total) by mouth daily. 05/30/18   Pucilowska, Braulio ConteJolanta B, MD  metoprolol succinate (TOPROL-XL) 50 MG 24 hr tablet  Take 1 tablet (50 mg total) by mouth daily. Take with or immediately following a meal. 05/30/18   Pucilowska, Jolanta B, MD  Multiple Vitamin (MULTIVITAMIN WITH MINERALS) TABS tablet Take 1 tablet by mouth daily. 09/08/18   Shaune Pollack, MD  nicotine (NICODERM CQ - DOSED IN MG/24 HOURS) 14 mg/24hr  patch Place 1 patch (14 mg total) onto the skin daily. 08/29/18   Pyreddy, Vivien Rota, MD  senna-docusate (SENOKOT-S) 8.6-50 MG tablet Take 1 tablet by mouth at bedtime as needed for mild constipation. 12/25/17   Mayo, Allyn Kenner, MD  testosterone cypionate (DEPOTESTOSTERONE CYPIONATE) 200 MG/ML injection Inject 200 mg into the muscle every 14 (fourteen) days.    [provider]  thiamine 100 MG tablet Take 1 tablet (100 mg total) by mouth daily for 30 days. 08/29/18 09/28/18  Ihor Austin, MD  traZODone (DESYREL) 100 MG tablet Take 1 tablet (100 mg total) by mouth at bedtime. 05/30/18   Pucilowska, Ellin Goodie, MD    Allergies Nsaids and Other  Family History  Problem Relation Age of Onset  . Rheum arthritis Father     Social History Social History   Tobacco Use  . Smoking status: Current Every Day Smoker    Packs/day: 0.50    Types: Cigarettes  . Smokeless tobacco: Never Used  Substance Use Topics  . Alcohol use: Yes    Alcohol/week: 20.0 - 30.0 standard drinks    Types: 20 - 30 Shots of liquor per week    Comment: Last drink 3 days ago 06/22/2018- rum  . Drug use: Yes    Types: Oxycodone    Review of Systems  Constitutional: Negative for fever. Eyes: Negative for visual changes. ENT: Negative for sore throat. Neck: No neck pain  Cardiovascular: Negative for chest pain. Respiratory: Negative for shortness of breath. Gastrointestinal: Negative for abdominal pain, vomiting or diarrhea. Genitourinary: Negative for dysuria. Musculoskeletal: Negative for back pain. Skin: Negative for rash. Neurological: Negative for headaches, weakness or numbness. Psych: No SI or HI  ____________________________________________   PHYSICAL EXAM:  VITAL SIGNS: ED Triage Vitals  Enc Vitals Group     BP 09/13/18 1836 (!) 133/91     Pulse Rate 09/13/18 1836 95     Resp 09/13/18 1836 19     Temp 09/13/18 1836 98.8 F (37.1 C)     Temp Source 09/13/18 1836 Oral     SpO2 09/13/18 1836  95 %     Weight 09/13/18 1834 185 lb (83.9 kg)     Height 09/13/18 1834 5\' 11"  (1.803 m)     Head Circumference --      Peak Flow --      Pain Score 09/13/18 1834 0     Pain Loc --      Pain Edu? --      Excl. in GC? --     Constitutional: Alert and oriented, intoxicated, no distress.  HEENT:      Head: Normocephalic and atraumatic.         Eyes: Conjunctivae are normal. Sclera is non-icteric.       Mouth/Throat: Mucous membranes are moist.       Neck: Supple with no signs of meningismus. Cardiovascular: Regular rate and rhythm. No murmurs, gallops, or rubs. 2+ symmetrical distal pulses are present in all extremities. No JVD. Respiratory: Normal respiratory effort. Lungs are clear to auscultation bilaterally. No wheezes, crackles, or rhonchi.  Gastrointestinal: Soft, non tender, and non distended with positive bowel sounds. No rebound or  guarding. Musculoskeletal: Nontender with normal range of motion in all extremities. No edema, cyanosis, or erythema of extremities. Neurologic: Normal speech and language. Face is symmetric. Moving all extremities. No gross focal neurologic deficits are appreciated. Skin: Skin is warm, dry and intact. No rash noted. Psychiatric: Mood and affect are normal. Speech and behavior are normal.  ____________________________________________   LABS (all labs ordered are listed, but only abnormal results are displayed)  Labs Reviewed - No data to display ____________________________________________  EKG  none  ____________________________________________  RADIOLOGY  none  ____________________________________________   PROCEDURES  Procedure(s) performed: None Procedures Critical Care performed:  None ____________________________________________   INITIAL IMPRESSION / ASSESSMENT AND PLAN / ED COURSE  58 y.o. male with a history of alcohol abuse well-known to this emergency department who presents for alcohol intoxication.  This is patient's  fourth visit to the emergency room just this week alone.  Has had labs done twice this week which showed no acute abnormalities.  No indication for labs at this time.  Patient is GCS of 15 but clearly intoxicated with normal vital signs.  No signs of trauma.  Will monitor until patient is clinically sober and discharged home.  Will initiate CIWA protocol.      As part of my medical decision making, I reviewed the following data within the electronic MEDICAL RECORD NUMBER Nursing notes reviewed and incorporated, Old chart reviewed, Notes from prior ED visits and [place] Controlled Substance Database    Pertinent labs & imaging results that were available during my care of the patient were reviewed by me and considered in my medical decision making (see chart for details).    ____________________________________________   FINAL CLINICAL IMPRESSION(S) / ED DIAGNOSES  Final diagnoses:  Alcohol dependence with intoxication with complication (HCC)      NEW MEDICATIONS STARTED DURING THIS VISIT:  ED Discharge Orders    None       Note:  This document was prepared using Dragon voice recognition software and may include unintentional dictation errors.    Don Perking, [place], MD 09/13/18 [phone]

## 2018-09-13 NOTE — ED Notes (Addendum)
Pt been here multiple times for alcohol intoxication.  He called EMS today because he "thought it was the right thing to do".  Denies pain or any drugs.  Only complaint is drinking too much per his report. Drowsy but opens eyes to voice. Is oriented to everything but day. Drinks 20 airplane bottles per day.

## 2018-09-13 NOTE — ED Notes (Signed)
Warm blankets provided. Bed in low locked position with side rails up, call bell at right side.

## 2018-09-14 ENCOUNTER — Inpatient Hospital Stay
Admission: EM | Admit: 2018-09-14 | Discharge: 2018-09-15 | DRG: 897 | Disposition: A | Discharge status: Home Health | Payer: Medicare Other | Attending: Internal Medicine | Admitting: Internal Medicine

## 2018-09-14 DIAGNOSIS — F1010 Alcohol abuse, uncomplicated: Secondary | ICD-10-CM | POA: Diagnosis present

## 2018-09-14 DIAGNOSIS — F10939 Alcohol use, unspecified with withdrawal, unspecified: Secondary | ICD-10-CM | POA: Diagnosis present

## 2018-09-14 DIAGNOSIS — M1990 Unspecified osteoarthritis, unspecified site: Secondary | ICD-10-CM | POA: Diagnosis not present

## 2018-09-14 DIAGNOSIS — Z886 Allergy status to analgesic agent status: Secondary | ICD-10-CM | POA: Diagnosis not present

## 2018-09-14 DIAGNOSIS — Z7984 Long term (current) use of oral hypoglycemic drugs: Secondary | ICD-10-CM | POA: Diagnosis not present

## 2018-09-14 DIAGNOSIS — I480 Paroxysmal atrial fibrillation: Secondary | ICD-10-CM | POA: Diagnosis present

## 2018-09-14 DIAGNOSIS — I110 Hypertensive heart disease with heart failure: Secondary | ICD-10-CM | POA: Diagnosis present

## 2018-09-14 DIAGNOSIS — F10239 Alcohol dependence with withdrawal, unspecified: Secondary | ICD-10-CM | POA: Diagnosis present

## 2018-09-14 DIAGNOSIS — Z8261 Family history of arthritis: Secondary | ICD-10-CM | POA: Diagnosis not present

## 2018-09-14 DIAGNOSIS — E1151 Type 2 diabetes mellitus with diabetic peripheral angiopathy without gangrene: Secondary | ICD-10-CM | POA: Diagnosis not present

## 2018-09-14 DIAGNOSIS — G7000 Myasthenia gravis without (acute) exacerbation: Secondary | ICD-10-CM | POA: Diagnosis not present

## 2018-09-14 DIAGNOSIS — E876 Hypokalemia: Secondary | ICD-10-CM | POA: Diagnosis present

## 2018-09-14 DIAGNOSIS — M5416 Radiculopathy, lumbar region: Secondary | ICD-10-CM | POA: Diagnosis not present

## 2018-09-14 DIAGNOSIS — Z20828 Contact with and (suspected) exposure to other viral communicable diseases: Secondary | ICD-10-CM | POA: Diagnosis present

## 2018-09-14 DIAGNOSIS — I5042 Chronic combined systolic (congestive) and diastolic (congestive) heart failure: Secondary | ICD-10-CM | POA: Diagnosis not present

## 2018-09-14 DIAGNOSIS — Z881 Allergy status to other antibiotic agents status: Secondary | ICD-10-CM | POA: Diagnosis not present

## 2018-09-14 DIAGNOSIS — F17210 Nicotine dependence, cigarettes, uncomplicated: Secondary | ICD-10-CM | POA: Diagnosis present

## 2018-09-14 LAB — URINE DRUG SCREEN, QUALITATIVE (ARMC ONLY)
Amphetamines, Ur Screen: NOT DETECTED
Barbiturates, Ur Screen: NOT DETECTED
Benzodiazepine, Ur Scrn: POSITIVE — AB
Cannabinoid 50 Ng, Ur ~~LOC~~: NOT DETECTED
Cocaine Metabolite,Ur ~~LOC~~: NOT DETECTED
MDMA (Ecstasy)Ur Screen: NOT DETECTED
Methadone Scn, Ur: NOT DETECTED
Opiate, Ur Screen: NOT DETECTED
Phencyclidine (PCP) Ur S: NOT DETECTED
Tricyclic, Ur Screen: NOT DETECTED

## 2018-09-14 LAB — BASIC METABOLIC PANEL
Anion gap: 14 (ref 5–15)
BUN: 11 mg/dL (ref 6–20)
CO2: 24 mmol/L (ref 22–32)
Calcium: 8 mg/dL — ABNORMAL LOW (ref 8.9–10.3)
Chloride: 104 mmol/L (ref 98–111)
Creatinine, Ser: 0.72 mg/dL (ref 0.61–1.24)
GFR calc Af Amer: >60 mL/min (ref >60)
GFR calc non Af Amer: >60 mL/min (ref >60)
Glucose, Bld: 121 mg/dL — ABNORMAL HIGH (ref 70–99)
Potassium: 3.4 mmol/L — ABNORMAL LOW (ref 3.5–5.1)
Sodium: 142 mmol/L (ref 135–145)

## 2018-09-14 LAB — CBC
HCT: 38 % — ABNORMAL LOW (ref 39.0–52.0)
Hemoglobin: 12.5 g/dL — ABNORMAL LOW (ref 13.0–17.0)
MCH: 29.3 pg (ref 26.0–34.0)
MCHC: 32.9 g/dL (ref 30.0–36.0)
MCV: 89 fL (ref 80.0–100.0)
Platelets: 291 10*3/uL (ref 150–400)
RBC: 4.27 MIL/uL (ref 4.22–5.81)
RDW: 18.9 % — ABNORMAL HIGH (ref 11.5–15.5)
WBC: 6.4 10*3/uL (ref 4.0–10.5)
nRBC: 0 % (ref 0.0–0.2)

## 2018-09-14 LAB — SARS CORONAVIRUS 2 BY RT PCR (HOSPITAL ORDER, PERFORMED IN ~~LOC~~ HOSPITAL LAB): SARS Coronavirus 2: NEGATIVE

## 2018-09-14 LAB — GLUCOSE, CAPILLARY
Glucose-Capillary: 96 mg/dL (ref 70–99)
Glucose-Capillary: 114 mg/dL — ABNORMAL HIGH (ref 70–99)
Glucose-Capillary: 153 mg/dL — ABNORMAL HIGH (ref 70–99)

## 2018-09-14 LAB — ACETAMINOPHEN LEVEL: Acetaminophen (Tylenol), Serum: <10 ug/mL — ABNORMAL LOW (ref 10–30)

## 2018-09-14 LAB — SALICYLATE LEVEL: Salicylate Lvl: <7 mg/dL (ref 2.8–30.0)

## 2018-09-14 LAB — ETHANOL: Alcohol, Ethyl (B): 231 mg/dL — ABNORMAL HIGH (ref <10)

## 2018-09-14 MED ORDER — INSULIN ASPART 100 UNIT/ML ~~LOC~~ SOLN: 0.0000 [IU] | Freq: Three times a day (TID) | SUBCUTANEOUS | Status: DC

## 2018-09-14 MED ORDER — LORAZEPAM 1 MG PO TABS
1.0000 mg | ORAL_TABLET | Freq: Four times a day (QID) | ORAL | Status: DC | PRN
  Administered 2018-09-14 – 2018-09-15 (×3): 1 mg via ORAL
  Filled 2018-09-14 (×3): qty 1

## 2018-09-14 MED ORDER — INSULIN ASPART 100 UNIT/ML ~~LOC~~ SOLN: 0.0000 [IU] | Freq: Every day | SUBCUTANEOUS | Status: DC

## 2018-09-14 MED ORDER — LORAZEPAM 2 MG/ML IJ SOLN
2.0000 mg | Freq: Once | INTRAMUSCULAR | Status: AC
  Administered 2018-09-14: 2 mg via INTRAVENOUS
  Filled 2018-09-14: qty 1

## 2018-09-14 MED ORDER — FOLIC ACID 1 MG PO TABS: 1.0000 mg | ORAL_TABLET | Freq: Every day | ORAL | Status: DC

## 2018-09-14 MED ORDER — CLONIDINE HCL 0.1 MG/24HR TD PTWK
0.1000 mg | MEDICATED_PATCH | TRANSDERMAL | Status: DC
  Administered 2018-09-14: 0.1 mg via TRANSDERMAL
  Filled 2018-09-14: qty 1

## 2018-09-14 MED ORDER — OXYCODONE HCL 5 MG PO TABS
10.0000 mg | ORAL_TABLET | Freq: Three times a day (TID) | ORAL | Status: DC | PRN
  Administered 2018-09-14 – 2018-09-15 (×3): 10 mg via ORAL
  Filled 2018-09-14 (×3): qty 2

## 2018-09-14 MED ORDER — HEPARIN SODIUM (PORCINE) 5000 UNIT/ML IJ SOLN
5000.0000 [IU] | Freq: Three times a day (TID) | INTRAMUSCULAR | Status: DC
  Administered 2018-09-14 – 2018-09-15 (×3): 5000 [IU] via SUBCUTANEOUS
  Filled 2018-09-14 (×3): qty 1

## 2018-09-14 MED ORDER — ADULT MULTIVITAMIN W/MINERALS CH: 1.0000 | ORAL_TABLET | Freq: Every day | ORAL | Status: DC

## 2018-09-14 MED ORDER — LORAZEPAM 2 MG/ML IJ SOLN
1.0000 mg | Freq: Four times a day (QID) | INTRAMUSCULAR | Status: DC | PRN
  Administered 2018-09-15: 1 mg via INTRAVENOUS
  Filled 2018-09-14: qty 1

## 2018-09-14 MED ORDER — NICOTINE 14 MG/24HR TD PT24
14.0000 mg | MEDICATED_PATCH | Freq: Every day | TRANSDERMAL | Status: DC
  Administered 2018-09-15: 14 mg via TRANSDERMAL
  Filled 2018-09-14 (×2): qty 1

## 2018-09-14 MED ORDER — LORAZEPAM 1 MG PO TABS
1.5000 mg | ORAL_TABLET | Freq: Once | ORAL | Status: AC
  Administered 2018-09-14: 1.5 mg via ORAL
  Filled 2018-09-14: qty 1

## 2018-09-14 MED ORDER — VITAMIN B-1 100 MG PO TABS: 100.0000 mg | ORAL_TABLET | Freq: Every day | ORAL | Status: DC

## 2018-09-14 MED ORDER — DILTIAZEM HCL ER COATED BEADS 120 MG PO CP24
120.0000 mg | ORAL_CAPSULE | ORAL | Status: AC
  Administered 2018-09-14: 09:00:00 120 mg via ORAL
  Filled 2018-09-14: qty 1

## 2018-09-14 MED ORDER — DIAZEPAM 2 MG PO TABS
2.0000 mg | ORAL_TABLET | Freq: Three times a day (TID) | ORAL | Status: DC
  Administered 2018-09-14 – 2018-09-15 (×3): 2 mg via ORAL
  Filled 2018-09-14 (×3): qty 1

## 2018-09-14 MED ORDER — TRAZODONE HCL 100 MG PO TABS
100.0000 mg | ORAL_TABLET | Freq: Every day | ORAL | Status: DC
  Administered 2018-09-14: 22:00:00 100 mg via ORAL
  Filled 2018-09-14: qty 1

## 2018-09-14 MED ORDER — GUAIFENESIN-DM 100-10 MG/5ML PO SYRP: 5.0000 mL | ORAL_SOLUTION | ORAL | Status: DC | PRN

## 2018-09-14 MED ORDER — VITAMIN B-1 100 MG PO TABS
100.0000 mg | ORAL_TABLET | Freq: Every day | ORAL | Status: DC
  Filled 2018-09-14: qty 1

## 2018-09-14 MED ORDER — ADULT MULTIVITAMIN W/MINERALS CH
1.0000 | ORAL_TABLET | Freq: Every day | ORAL | Status: DC
  Administered 2018-09-14 – 2018-09-15 (×2): 1 via ORAL
  Filled 2018-09-14 (×2): qty 1

## 2018-09-14 MED ORDER — FOLIC ACID 1 MG PO TABS
1.0000 mg | ORAL_TABLET | Freq: Every day | ORAL | Status: DC
  Administered 2018-09-14 – 2018-09-15 (×2): 1 mg via ORAL
  Filled 2018-09-14 (×3): qty 1

## 2018-09-14 MED ORDER — METOPROLOL SUCCINATE ER 50 MG PO TB24
50.0000 mg | ORAL_TABLET | Freq: Every day | ORAL | Status: DC
  Administered 2018-09-14 – 2018-09-15 (×2): 50 mg via ORAL
  Filled 2018-09-14 (×2): qty 1

## 2018-09-14 MED ORDER — LORAZEPAM 1 MG PO TABS: 1.0000 mg | ORAL_TABLET | Freq: Four times a day (QID) | ORAL | Status: DC | PRN

## 2018-09-14 MED ORDER — THIAMINE HCL 100 MG/ML IJ SOLN: 100.0000 mg | Freq: Every day | INTRAMUSCULAR | Status: DC

## 2018-09-14 MED ORDER — LORAZEPAM 2 MG/ML IJ SOLN: 1.0000 mg | Freq: Four times a day (QID) | INTRAMUSCULAR | Status: DC | PRN

## 2018-09-14 MED ORDER — METFORMIN HCL 500 MG PO TABS
500.0000 mg | ORAL_TABLET | ORAL | Status: AC
  Administered 2018-09-14: 500 mg via ORAL
  Filled 2018-09-14: qty 1

## 2018-09-14 MED ORDER — VITAMIN B-1 100 MG PO TABS
100.0000 mg | ORAL_TABLET | Freq: Every day | ORAL | Status: DC
  Administered 2018-09-14 – 2018-09-15 (×2): 100 mg via ORAL
  Filled 2018-09-14: qty 1

## 2018-09-14 MED ORDER — DOCUSATE SODIUM 100 MG PO CAPS: 100.0000 mg | ORAL_CAPSULE | Freq: Two times a day (BID) | ORAL | Status: DC | PRN

## 2018-09-14 MED ORDER — NICOTINE 21 MG/24HR TD PT24
21.0000 mg | MEDICATED_PATCH | Freq: Once | TRANSDERMAL | Status: AC
Start: 2018-09-14 — End: 2018-09-15
  Administered 2018-09-14: 21 mg via TRANSDERMAL

## 2018-09-14 MED ORDER — SENNOSIDES-DOCUSATE SODIUM 8.6-50 MG PO TABS: 1.0000 | ORAL_TABLET | Freq: Every evening | ORAL | Status: DC | PRN

## 2018-09-14 NOTE — ED Provider Notes (Signed)
EKG reviewed by me at 8 AM Heart rate 160 QRS 80 QTc 480 Notable artifact, but atrial fibrillation is present.  No obvious ischemic changes are noted.  Of note the patient remains asymptomatic to this, he has been given his metoprolol at this point and his oral Cardizem dose is pending from pharmacy   Sharyn Creamer, MD 09/14/18 [phone]

## 2018-09-14 NOTE — ED Notes (Signed)
TTS at bedside. 

## 2018-09-14 NOTE — ED Provider Notes (Signed)
Pts clinical information faxed to [facility] for review for possible detox placement.

## 2018-09-14 NOTE — ED Notes (Signed)
EDP at bedside  

## 2018-09-14 NOTE — BH Assessment (Signed)
This Clinical research associate spoke with [facility] and [facility] who both DENIED pt for detox treatment due to "medical acuity". Both facilities felt as if pt needed a higher level of care to address his excessive alcohol use.

## 2018-09-14 NOTE — ED Notes (Signed)
Peripheral IV discontinued. Catheter intact. No signs of infiltration or redness. Gauze applied to IV site.   Meal tray and diet soda with water given as requested  Pt wheeled to lobby, taxi called "25 to 30 minutes for pickup"  Discharge instructions reviewed with patient. Questions fielded by this RN. Patient verbalizes understanding of instructions. Patient discharged home in stable condition per Kurt Horton. No acute distress noted at time of discharge.

## 2018-09-14 NOTE — ED Notes (Signed)
Pt given water and meal tray 

## 2018-09-14 NOTE — ED Notes (Signed)
Pt used urinal, 1 person assist.

## 2018-09-14 NOTE — ED Provider Notes (Signed)
-----------------------------------------   1:11 AM on 09/14/2018 -----------------------------------------  Patient awake alert requesting food.  Patient states he wants to take a taxi home.  Patient appears at his baseline.  I believe the patient is safely able to take a taxi home.  He is well-known to myself as well as ER staff for frequent intoxication visits.  Patient has been in the emergency department 6-1/2 hours at this time.   Minna Antis, MD 09/14/18 0111

## 2018-09-14 NOTE — H&P (Signed)
[employer] - [place] at [facility]   PATIENT NAME: Kurt Horton    MR#:  [id number]  DATE OF BIRTH:  [birth year]  DATE OF ADMISSION:  09/14/2018  PRIMARY CARE PHYSICIAN: [name], MD   REQUESTING/REFERRING PHYSICIAN: Quale  CHIEF COMPLAINT:   Chief Complaint  Patient presents with  . Alcohol Problem    HISTORY OF PRESENT ILLNESS: Kurt Horton  is a 58 y.o. male with a known history of A. fib, alcohol abuse, recurrent alcohol withdrawals and admissions, diabetes, hypertension-had multiple admissions in the past and I had seen him during last admission.  During last admission he was admitted for alcohol withdrawal and also had suspected pneumonia. As per patient since he went home after 2 days he started having some shaking so he started drinking alcohol again.  He did not drink since yesterday in trial to quit and started having significant shaking so came back to emergency room. He was checked for COVID and negative in ER.  He denies any other complaints.  PAST MEDICAL HISTORY:   Past Medical History:  Diagnosis Date  . A-fib (HCC)   . Alcohol abuse   . Alcohol withdrawal (HCC) 11/12/2016  . Diabetes mellitus without complication (HCC)   . DJD (degenerative joint disease)   . Hypertension   . Myasthenia gravis (HCC)   . Renal disorder     PAST SURGICAL HISTORY:  Past Surgical History:  Procedure Laterality Date  . AMPUTATION TOE Left 12/29/2015   Procedure: AMPUTATION TOE;  Surgeon: Gwyneth Revels, DPM;  Location: [facility] ORS;  Service: Podiatry;  Laterality: Left;  . ESOPHAGOGASTRODUODENOSCOPY N/A 02/05/2018   Procedure: ESOPHAGOGASTRODUODENOSCOPY (EGD);  Surgeon: Pasty Spillers, MD;  Location: [facility] ENDOSCOPY;  Service: Endoscopy;  Laterality: N/A;  . PERIPHERAL VASCULAR CATHETERIZATION N/A 12/27/2015   Procedure: Lower Extremity Angiography;  Surgeon: Annice Needy, MD;  Location: [facility] INVASIVE CV LAB;  Service: Cardiovascular;  Laterality: N/A;  .  right hip surgery    . right shoulder surgery      SOCIAL HISTORY:  Social History   Tobacco Use  . Smoking status: Current Every Day Smoker    Packs/day: 0.50    Types: Cigarettes  . Smokeless tobacco: Never Used  Substance Use Topics  . Alcohol use: Yes    Alcohol/week: 20.0 - 30.0 standard drinks    Types: 20 - 30 Shots of liquor per week    Comment: Last drink 3 days ago 06/22/2018- rum    FAMILY HISTORY:  Family History  Problem Relation Age of Onset  . Rheum arthritis Father     DRUG ALLERGIES:  Allergies  Allergen Reactions  . Nsaids Other (See Comments)    Duodenal ulcers, GI bleeding  . Other Other (See Comments)    Patient states he can't take any "mycin" medications because myasthenia gravis.    REVIEW OF SYSTEMS:   CONSTITUTIONAL: No fever, fatigue or weakness.  EYES: No blurred or double vision.  EARS, NOSE, AND THROAT: No tinnitus or ear pain.  RESPIRATORY: No cough, shortness of breath, wheezing or hemoptysis.  CARDIOVASCULAR: No chest pain, orthopnea, edema.  GASTROINTESTINAL: No nausea, vomiting, diarrhea or abdominal pain.  GENITOURINARY: No dysuria, hematuria.  ENDOCRINE: No polyuria, nocturia,  HEMATOLOGY: No anemia, easy bruising or bleeding SKIN: No rash or lesion. MUSCULOSKELETAL: No joint pain or arthritis.   NEUROLOGIC: No tingling, numbness, weakness.  PSYCHIATRY: No anxiety or depression.   MEDICATIONS AT HOME:  Prior to Admission medications   Medication Sig Start  Date End Date Taking? Authorizing Provider  amoxicillin-clavulanate (AUGMENTIN) 875-125 MG tablet Take 1 tablet by mouth every 12 (twelve) hours. 09/07/18  Yes Shaune Pollackhen, Qing, MD  cloNIDine (CATAPRES - DOSED IN MG/24 HR) 0.1 mg/24hr patch Place 1 patch onto the skin once a week. 05/30/18  Yes [provider]  diltiazem (CARDIZEM CD) 120 MG 24 hr capsule Take 1 capsule (120 mg total) by mouth daily. 06/29/18  Yes Altamese DillingVachhani, Leith Hedlund, MD  folic acid (FOLVITE) 1 MG tablet  Take 1 tablet (1 mg total) by mouth daily for 30 days. 08/29/18 09/28/18 Yes Pyreddy, Vivien RotaPavan, MD  hydrOXYzine (ATARAX/VISTARIL) 25 MG tablet Take 1 tablet (25 mg total) by mouth 3 (three) times daily as needed for anxiety. 09/07/18  Yes Shaune Pollackhen, Qing, MD  metFORMIN (GLUCOPHAGE) 500 MG tablet Take 1 tablet (500 mg total) by mouth daily. 05/30/18  Yes Pucilowska, Jolanta B, MD  metoprolol succinate (TOPROL-XL) 50 MG 24 hr tablet Take 1 tablet (50 mg total) by mouth daily. Take with or immediately following a meal. 05/30/18  Yes Pucilowska, Jolanta B, MD  Multiple Vitamin (MULTIVITAMIN WITH MINERALS) TABS tablet Take 1 tablet by mouth daily. 09/08/18  Yes Shaune Pollackhen, Qing, MD  senna-docusate (SENOKOT-S) 8.6-50 MG tablet Take 1 tablet by mouth at bedtime as needed for mild constipation. 12/25/17  Yes Mayo, Allyn KennerKaty Dodd, MD  thiamine 100 MG tablet Take 1 tablet (100 mg total) by mouth daily for 30 days. 08/29/18 09/28/18 Yes Pyreddy, Vivien RotaPavan, MD  traZODone (DESYREL) 100 MG tablet Take 1 tablet (100 mg total) by mouth at bedtime. 05/30/18  Yes Pucilowska, Jolanta B, MD  guaiFENesin-dextromethorphan (ROBITUSSIN DM) 100-10 MG/5ML syrup Take 5 mLs by mouth every 4 (four) hours as needed for cough. 09/07/18   Shaune Pollackhen, Qing, MD  nicotine (NICODERM CQ - DOSED IN MG/24 HOURS) 14 mg/24hr patch Place 1 patch (14 mg total) onto the skin daily. 08/29/18   Ihor AustinPyreddy, Pavan, MD  Oxycodone HCl 10 MG TABS Take 10 mg by mouth daily. 09/04/18 09/19/18  [provider]  testosterone cypionate (DEPOTESTOSTERONE CYPIONATE) 200 MG/ML injection Inject 200 mg into the muscle every 14 (fourteen) days.    [provider]      PHYSICAL EXAMINATION:   VITAL SIGNS: Blood pressure 118/88, pulse (!) 129, temperature 98.1 F (36.7 C), temperature source Oral, resp. rate 18, height 5\' 11"  (1.803 m), weight 84 kg, SpO2 99 %.  GENERAL:  58 y.o.-year-old patient lying in the bed with no acute distress.  EYES: Pupils equal, round, reactive to light and  accommodation. No scleral icterus. Extraocular muscles intact.  HEENT: Head atraumatic, normocephalic. Oropharynx and nasopharynx clear.  NECK:  Supple, no jugular venous distention. No thyroid enlargement, no tenderness.  LUNGS: Normal breath sounds bilaterally, no wheezing, rales,rhonchi or crepitation. No use of accessory muscles of respiration.  CARDIOVASCULAR: S1, S2 normal. No murmurs, rubs, or gallops.  ABDOMEN: Soft, nontender, nondistended. Bowel sounds present. No organomegaly or mass.  EXTREMITIES: No pedal edema, cyanosis, or clubbing.  NEUROLOGIC: Cranial nerves II through XII are intact. Muscle strength 4/5 in all extremities. Sensation intact. Gait not checked.  He has coarse shaking on his hands. PSYCHIATRIC: The patient is alert and oriented x 3.  SKIN: No obvious rash, lesion, or ulcer.   LABORATORY PANEL:   CBC Recent Labs  Lab 09/10/18 0155 09/10/18 1425 09/14/18 0440  WBC 9.2 10.6* 6.4  HGB 12.9* 13.6 12.5*  HCT 41.5 42.5 38.0*  PLT 290 271 291  MCV 92.4 91.6 89.0  MCH 28.7 29.3 29.3  MCHC 31.1 32.0 32.9  RDW 19.3* 19.2* 18.9*  LYMPHSABS 3.6 4.0  --   MONOABS 1.1* 1.3*  --   EOSABS 0.1 0.1  --   BASOSABS 0.1 0.1  --    ------------------------------------------------------------------------------------------------------------------  Chemistries  Recent Labs  Lab 09/10/18 0155 09/10/18 1425 09/14/18 0440  NA 140 140 142  K 3.8 4.5 3.4*  CL 102 100 104  CO2 GLUCOSE 106* 117* 121*  BUN CREATININE 0.60* 0.55* 0.72  CALCIUM 8.3* 8.5* 8.0*  AST 22 28  --   ALT 18 20  --   ALKPHOS 62 72  --   BILITOT 0.4 0.8  --    ------------------------------------------------------------------------------------------------------------------ estimated creatinine clearance is 108.5 mL/min (by C-G formula based on SCr of 0.72  mg/dL). ------------------------------------------------------------------------------------------------------------------ No results for input(s): TSH, T4TOTAL, T3FREE, THYROIDAB in the last 72 hours.  Invalid input(s): FREET3   Coagulation profile No results for input(s): INR, PROTIME in the last 168 hours. ------------------------------------------------------------------------------------------------------------------- No results for input(s): DDIMER in the last 72 hours. -------------------------------------------------------------------------------------------------------------------  Cardiac Enzymes No results for input(s): CKMB, TROPONINI, MYOGLOBIN in the last 168 hours.  Invalid input(s): CK ------------------------------------------------------------------------------------------------------------------ Invalid input(s): POCBNP  ---------------------------------------------------------------------------------------------------------------  Urinalysis    Component Value Date/Time   COLORURINE YELLOW (A) 09/01/2018 0301   APPEARANCEUR CLEAR (A) 09/01/2018 0301   LABSPEC >1.046 (H) 09/01/2018 0301   PHURINE 5.0 09/01/2018 0301   GLUCOSEU NEGATIVE 09/01/2018 0301   HGBUR NEGATIVE 09/01/2018 0301   BILIRUBINUR NEGATIVE 09/01/2018 0301   KETONESUR NEGATIVE 09/01/2018 0301   PROTEINUR NEGATIVE 09/01/2018 0301   NITRITE NEGATIVE 09/01/2018 0301   LEUKOCYTESUR NEGATIVE 09/01/2018 0301     RADIOLOGY: No results found.  EKG: Orders placed or performed during the hospital encounter of 09/14/18  . ED EKG  . ED EKG  . EKG 12-Lead  . EKG 12-Lead    IMPRESSION AND PLAN:  *Alcohol withdrawal Patient has recurrent admissions for the same thing. He was provided resources for alcohol withdrawal help multiple times by psychiatry and social worker team. We will admit with CIWA protocol. We will give Valium on scheduled dose to control withdrawal better. Vitamin  supplements and IV fluids.  *Hypertension Continue home medications.  *Atrial fibrillation Continue Cardizem and metoprolol.  Not a candidate for anticoagulation due to alcoholism.  *Diabetes Continue metformin and keep on sliding scale coverage.  *Smoking Counseled to quit smoking for 4 minutes and offered nicotine patch.  All the records are reviewed and case discussed with ED provider. Management plans discussed with the patient, family and they are in agreement.  CODE STATUS: Full code. Code Status History    Date Active Date Inactive Code Status Order ID Comments User Context   08/26/2018 1055 08/28/2018 1423 Full Code [id number]  Milagros Loll, MD ED   08/26/2018 [id number] 08/26/2018 1055 Full Code [id number]  Arnaldo Natal, MD ED   08/17/2018 1924 08/20/2018 1838 Full Code [id number]  Milagros Loll, MD ED   06/24/2018 1438 06/28/2018 2141 Full Code [id number]  Altamese Dilling, MD ED   06/24/2018 1207 06/24/2018 1438 Full Code [id number]  Arnaldo Natal, MD ED   06/12/2018 0350 06/13/2018 0101 Full Code [id number]  Darci Current, MD ED   05/25/2018 1609 05/30/2018 1913 Full Code [id number]  Luciano Cutter, DO Inpatient   05/17/2018 0121 05/21/2018 1635 Full Code [id number]  Mayo, Allyn Kenner, MD Inpatient   04/15/2018 1145 04/19/2018 2132 Full Code [id number]  Delfino Lovett, MD Inpatient   04/03/2018 0547 04/10/2018 2052 Full Code [id number]  Barbaraann Rondo, MD ED   02/04/2018 1528 02/07/2018 1728 Full Code [id number]  Mayo, Allyn Kenner, MD ED   02/03/2018 2233 02/04/2018 0841 Full Code [id number]  Jene Every, MD ED   01/20/2018 0140 01/22/2018 1819 Full Code [id number]  Barbaraann Rondo, MD ED   01/19/2018 1239 01/20/2018 0140 Full Code [id number]  Myrna Blazer, MD ED   12/22/2017 0841 12/25/2017 2031 Full Code [id number]  Barbaraann Rondo, MD Inpatient   10/27/2017 1522 11/02/2017 1432 Full Code [id number]  Milagros Loll, MD ED   10/21/2017 1945 10/23/2017 1833 Full Code [id number]  Auburn Bilberry, MD Inpatient   10/21/2017 1504 10/21/2017 1945 Full Code [id number]  Arnaldo Natal, MD ED   10/10/2017 1314 10/11/2017 1147 Full Code [id number]  Willy Eddy, MD ED   10/05/2017 2214 10/07/2017 2101 Full Code [id number]  Arnaldo Natal, MD ED   10/05/2017 1924 10/05/2017 2214 Full Code [id number]  Arnaldo Natal, MD ED   10/01/2017 0807 10/04/2017 1650 Full Code [id number]  Barbaraann Rondo, MD Inpatient   08/24/2017 1756 08/27/2017 1849 Full Code [id number]  Enid Baas, MD ED   08/14/2017 0030 08/18/2017 1605 Full Code [id number]  Oralia Manis, MD Inpatient   06/04/2017 0116 06/06/2017 1701 Full Code [id number]  Oralia Manis, MD Inpatient   03/19/2017 1559 03/20/2017 2128 Full Code [id number]  Arnaldo Natal, MD ED   02/25/2017 1421 03/01/2017 1829 Full Code [id number]  Houston Siren, MD Inpatient   11/12/2016 1533 11/14/2016 1425 Full Code [id number]  Marguarite Arbour, MD Inpatient   12/26/2015 1927 12/30/2015 1604 Full Code [id number]  Marguarite Arbour, MD Inpatient       TOTAL TIME TAKING CARE OF THIS PATIENT: 45 minutes.    Altamese Dilling M.D on 09/14/2018   Between 7am to 6pm - Pager - [phone]  After 6pm go to [email or web address] - password [email or web address]  [facility] [place]  Office  [phone]  CC: Primary care physician; [name], MD   Note: This dictation was prepared with Dragon dictation along with smaller phrase technology. Any transcriptional errors that result from this process are unintentional.

## 2018-09-14 NOTE — ED Triage Notes (Signed)
Patient coming [facility] for alcohol intoxication/withdrawl. Patient seen earlier tonight in this ED for same.

## 2018-09-14 NOTE — ED Notes (Signed)
Pt had episode of diarrhea on bed. Was cleaned with warm wipes. Assisted to toilet to have another episode of diarrhea. Given clean underwear and blue scrub pants d/t clothes being soiled. Pt did NOT want dirty clothes thrown out so placed in pt belongings bag.

## 2018-09-14 NOTE — ED Provider Notes (Signed)
[facility] Emergency Department Provider Note  Time seen: 4:32 AM  I have reviewed the triage vital signs and the nursing notes.   HISTORY  Chief Complaint Alcohol Problem    HPI Kurt Horton is a 58 y.o. male a past medical history of alcohol abuse, diabetes, hypertension, presents to the emergency department with alcohol abuse.  According to the patient he admits he is a heavy alcohol drinker and he is asking for help with withdrawals and states he wants to go to detox.  Patient was literally discharged from the emergency department 3 hours ago and is back already.  When asked if the patient has drinking any alcohol between discharge and now the patient states "I do not remember."  Patient is asking for something to help with the "shakes."  However the patient shows no tremor currently.  Patient denies any fever cough or congestion.  I had a long discussion with the patient regarding goals of treatment and care in the emergency department.  He does wish to attempt to go to detox.   Past Medical History:  Diagnosis Date  . A-fib (HCC)   . Alcohol abuse   . Alcohol withdrawal (HCC) 11/12/2016  . Diabetes mellitus without complication (HCC)   . DJD (degenerative joint disease)   . Hypertension   . Myasthenia gravis (HCC)   . Renal disorder     Patient Active Problem List   Diagnosis Date Noted  . Alcohol withdrawal (HCC) 08/26/2018  . A-fib (HCC) 08/17/2018  . Tobacco use disorder 05/27/2018  . GI bleed 02/04/2018  . Alcohol intoxication (HCC) 12/22/2017  . Malnutrition of moderate degree 10/04/2017  . Chronic combined systolic and diastolic CHF (congestive heart failure) (HCC) 08/13/2017  . Alcohol withdrawal delirium (HCC) 02/26/2017  . Atrial fibrillation with RVR (HCC) 02/25/2017  . Acute alcoholic intoxication with complication (HCC)   . Alcohol use disorder, severe, dependence (HCC)   . Substance induced mood disorder (HCC) 11/13/2016  . SVT  (supraventricular tachycardia) (HCC) 11/12/2016  . HTN (hypertension) 11/12/2016  . EtOH dependence (HCC) 11/12/2016  . Lumbar radiculopathy 08/24/2016  . Chronic low back pain 01/04/2016  . Pressure ulcer 12/29/2015  . Osteomyelitis (HCC) 12/26/2015  . PVD (peripheral vascular disease) (HCC) 12/26/2015  . Type II diabetes mellitus with manifestations (HCC) 12/26/2015  . Myasthenia gravis (HCC) 12/26/2015    Past Surgical History:  Procedure Laterality Date  . AMPUTATION TOE Left 12/29/2015   Procedure: AMPUTATION TOE;  Surgeon: Gwyneth Revels, DPM;  Location: [facility] ORS;  Service: Podiatry;  Laterality: Left;  . ESOPHAGOGASTRODUODENOSCOPY N/A 02/05/2018   Procedure: ESOPHAGOGASTRODUODENOSCOPY (EGD);  Surgeon: Pasty Spillers, MD;  Location: [facility] ENDOSCOPY;  Service: Endoscopy;  Laterality: N/A;  . PERIPHERAL VASCULAR CATHETERIZATION N/A 12/27/2015   Procedure: Lower Extremity Angiography;  Surgeon: Annice Needy, MD;  Location: [facility] INVASIVE CV LAB;  Service: Cardiovascular;  Laterality: N/A;  . right hip surgery    . right shoulder surgery      Prior to Admission medications   Medication Sig Start Date End Date Taking? Authorizing Provider  amoxicillin-clavulanate (AUGMENTIN) 875-125 MG tablet Take 1 tablet by mouth every 12 (twelve) hours. 09/07/18   Shaune Pollack, MD  diltiazem (CARDIZEM CD) 120 MG 24 hr capsule Take 1 capsule (120 mg total) by mouth daily. 06/29/18   Altamese Dilling, MD  folic acid (FOLVITE) 1 MG tablet Take 1 tablet (1 mg total) by mouth daily for 30 days. 08/29/18 09/28/18  Ihor Austin, MD  guaiFENesin-dextromethorphan (ROBITUSSIN DM) 100-10 MG/5ML syrup Take 5 mLs by mouth every 4 (four) hours as needed for cough. 09/07/18   Shaune Pollackhen, Qing, MD  hydrOXYzine (ATARAX/VISTARIL) 25 MG tablet Take 1 tablet (25 mg total) by mouth 3 (three) times daily as needed for anxiety. 09/07/18   Shaune Pollackhen, Qing, MD  metFORMIN (GLUCOPHAGE) 500 MG tablet Take 1 tablet (500 mg total) by mouth  daily. 05/30/18   Pucilowska, Braulio ConteJolanta B, MD  metoprolol succinate (TOPROL-XL) 50 MG 24 hr tablet Take 1 tablet (50 mg total) by mouth daily. Take with or immediately following a meal. 05/30/18   Pucilowska, Jolanta B, MD  Multiple Vitamin (MULTIVITAMIN WITH MINERALS) TABS tablet Take 1 tablet by mouth daily. 09/08/18   Shaune Pollackhen, Qing, MD  nicotine (NICODERM CQ - DOSED IN MG/24 HOURS) 14 mg/24hr patch Place 1 patch (14 mg total) onto the skin daily. 08/29/18   Pyreddy, Vivien RotaPavan, MD  senna-docusate (SENOKOT-S) 8.6-50 MG tablet Take 1 tablet by mouth at bedtime as needed for mild constipation. 12/25/17   Mayo, Allyn KennerKaty Dodd, MD  testosterone cypionate (DEPOTESTOSTERONE CYPIONATE) 200 MG/ML injection Inject 200 mg into the muscle every 14 (fourteen) days.    [provider]  thiamine 100 MG tablet Take 1 tablet (100 mg total) by mouth daily for 30 days. 08/29/18 09/28/18  Ihor AustinPyreddy, Pavan, MD  traZODone (DESYREL) 100 MG tablet Take 1 tablet (100 mg total) by mouth at bedtime. 05/30/18   Pucilowska, Ellin GoodieJolanta B, MD    Allergies  Allergen Reactions  . Nsaids Other (See Comments)    Duodenal ulcers, GI bleeding  . Other Other (See Comments)    Patient states he can't take any "mycin" medications because myasthenia gravis.    Family History  Problem Relation Age of Onset  . Rheum arthritis Father     Social History Social History   Tobacco Use  . Smoking status: Current Every Day Smoker    Packs/day: 0.50    Types: Cigarettes  . Smokeless tobacco: Never Used  Substance Use Topics  . Alcohol use: Yes    Alcohol/week: 20.0 - 30.0 standard drinks    Types: 20 - 30 Shots of liquor per week    Comment: Last drink 3 days ago 06/22/2018- rum  . Drug use: Yes    Types: Oxycodone    Review of Systems Constitutional: Negative for fever. Cardiovascular: Negative for chest pain. Respiratory: Negative for shortness of breath. Gastrointestinal: Negative for abdominal pain Musculoskeletal: Negative for  musculoskeletal complaints Neurological: Negative for headache All other ROS negative  ____________________________________________   PHYSICAL EXAM:  VITAL SIGNS: ED Triage Vitals  Enc Vitals Group     BP 09/14/18 0413 (!) 155/106     Pulse Rate 09/14/18 0413 85     Resp 09/14/18 0413 18     Temp 09/14/18 0413 98.1 F (36.7 C)     Temp Source 09/14/18 0413 Oral     SpO2 09/14/18 0413 96 %     Weight 09/14/18 0411 185 lb 3 oz (84 kg)     Height 09/14/18 0411 5\' 11"  (1.803 m)     Head Circumference --      Peak Flow --      Pain Score 09/14/18 0411 5     Pain Loc --      Pain Edu? --      Excl. in GC? --    Constitutional: Alert. Well appearing and in no distress.  Speaking clearly without slurred speech. Eyes: Normal exam ENT  Head: Normocephalic and atraumatic.      Mouth/Throat: Mucous membranes are moist. Cardiovascular: Normal rate, regular rhythm.  Respiratory: Normal respiratory effort without tachypnea nor retractions. Breath sounds are clear  Gastrointestinal: Soft and nontender. No distention. Musculoskeletal: Nontender with normal range of motion in all extremities.  Neurologic:  Normal speech and language. No gross focal neurologic deficits Skin:  Skin is warm, dry and intact.  Psychiatric: Mood and affect are normal.   ____________________________________________   INITIAL IMPRESSION / ASSESSMENT AND PLAN / ED COURSE  Pertinent labs & imaging results that were available during my care of the patient were reviewed by me and considered in my medical decision making (see chart for details).   Patient presents to the emergency department for alcohol abuse.  Patient is requesting detox.  Patient is asking for something to help with the "shakes" however the patient has no tremor at this time.  Reassuring vitals.  Given the patient's recent discharge from the emergency department, he wishes to go to detox.  We will attempt to place the patient in a detox  facility such as [facility].  Patient agreeable to plan of care.  Patient's labs are largely at baseline.  Ethanol level of 231.  TTS is currently discussing the patient with [facility] who is reviewing the patient's chart to decide if the patient has appropriate further detox facility.  Patient care signed out to oncoming physician.  Kurt Horton was evaluated in Emergency Department on 09/14/2018 for the symptoms described in the history of present illness. He was evaluated in the context of the global COVID-19 pandemic, which necessitated consideration that the patient might be at risk for infection with the SARS-CoV-2 virus that causes COVID-19. Institutional protocols and algorithms that pertain to the evaluation of patients at risk for COVID-19 are in a state of rapid change based on information released by regulatory bodies including the CDC and federal and state organizations. These policies and algorithms were followed during the patient's care in the ED.  ____________________________________________   FINAL CLINICAL IMPRESSION(S) / ED DIAGNOSES  Alcohol abuse   Minna Antis, MD 09/14/18 [phone]

## 2018-09-14 NOTE — ED Notes (Addendum)
Pt's father on phone, phone given to pt, pt reports intention to take taxi home; pt reports ability to pay; father reports will open door for pt

## 2018-09-14 NOTE — Progress Notes (Signed)
Family Meeting Note  Advance Directive:no  Today a meeting took place with the Patient.   The following clinical team members were present during this meeting:MD  The following were discussed:Patient's diagnosis: Alcoholism and recurrent admissions for alcohol withdrawal, Patient's progosis: Unable to determine and Goals for treatment: Full Code  Additional follow-up to be provided: PMD  Time spent during discussion:20 minutes  Altamese Dilling, MD

## 2018-09-14 NOTE — ED Notes (Signed)
Admitting MD at bedside.

## 2018-09-14 NOTE — ED Notes (Signed)
Pt ambulatory to toilet with assist to have BM.

## 2018-09-14 NOTE — ED Notes (Signed)
ED Provider at bedside. 

## 2018-09-14 NOTE — ED Provider Notes (Signed)
-----------------------------------------   7:50 AM on 09/14/2018 -----------------------------------------  Patient is alert, oriented.  Reports he is presenting feel little restless.  I did discuss with him as a recheck his heart rate is notably elevated, he does however report he has a history of atrial fibrillation and he has not noticed that his heart is racing is not having chest pain or trouble breathing.  Does report he does not think he is taking his medications yesterday.  His goal right now is feel to get into some type of rehabilitation.  He is requesting some medication treat early withdrawal.  We will continue to monitor him, ordered EKG, also his home atenolol and diltiazem dosing.  We will continue to monitor him closely, at this point asymptomatic with irregular pulse that appears consistent with A. fib.  He is nontoxic appearing no acute distress fully alert without disorientation or abnormal behavior.   Sharyn Creamer, MD 09/14/18 [phone]

## 2018-09-14 NOTE — ED Provider Notes (Signed)
COVID test negative.  Patient to be admitted to the hospitalist service for ongoing care of his alcohol withdrawals.  Patient in agreement.  Improving somewhat with IV Ativan continuing to monitor his withdrawal scores closely.   Sharyn Creamer, MD 09/14/18 1432

## 2018-09-14 NOTE — ED Notes (Signed)
Pt given diet sprite. Also has 2 cups of water at bedside.

## 2018-09-14 NOTE — ED Notes (Signed)
Pt states he drinks 20-30 airplane bottles of rum everyday.

## 2018-09-14 NOTE — ED Notes (Signed)
ED TO INPATIENT HANDOFF REPORT  ED Nurse Name and Phone #: Rosmery Duggin [phone]  S Name/Age/Gender Kurt Horton 58 y.o. male Room/Bed: ED07A/ED07A  Code Status   Code Status: Prior  Home/SNF/Other Home Patient oriented to: self, place, time and situation Is this baseline? Yes   Triage Complete: Triage complete  Chief Complaint [facility]  Triage Note Patient coming [facility] for alcohol intoxication/withdrawl. Patient seen earlier tonight in this ED for same.    Allergies Allergies  Allergen Reactions  . Nsaids Other (See Comments)    Duodenal ulcers, GI bleeding  . Other Other (See Comments)    Patient states he can't take any "mycin" medications because myasthenia gravis.    Level of Care/Admitting Diagnosis ED Disposition    ED Disposition Condition Comment   Admit  Hospital Area: [facility] [[id number]]  Level of Care: Med-Surg [16]  Covid Evaluation: N/A  Diagnosis: Alcohol withdrawal (HCC) [291.81.ICD-9-CM]  Admitting Physician: Altamese Dilling [[id number]]  Attending Physician: Altamese Dilling [phone]  Estimated length of stay: past midnight tomorrow  Certification:: I certify this patient will need inpatient services for at least 2 midnights  PT Class (Do Not Modify): Inpatient [101]  PT Acc Code (Do Not Modify): Private [1]       B Medical/Surgery History Past Medical History:  Diagnosis Date  . A-fib (HCC)   . Alcohol abuse   . Alcohol withdrawal (HCC) 11/12/2016  . Diabetes mellitus without complication (HCC)   . DJD (degenerative joint disease)   . Hypertension   . Myasthenia gravis (HCC)   . Renal disorder    Past Surgical History:  Procedure Laterality Date  . AMPUTATION TOE Left 12/29/2015   Procedure: AMPUTATION TOE;  Surgeon: Gwyneth Revels, DPM;  Location: [facility] ORS;  Service: Podiatry;  Laterality: Left;  . ESOPHAGOGASTRODUODENOSCOPY N/A 02/05/2018   Procedure: ESOPHAGOGASTRODUODENOSCOPY (EGD);  Surgeon:  Pasty Spillers, MD;  Location: [facility] ENDOSCOPY;  Service: Endoscopy;  Laterality: N/A;  . PERIPHERAL VASCULAR CATHETERIZATION N/A 12/27/2015   Procedure: Lower Extremity Angiography;  Surgeon: Annice Needy, MD;  Location: [facility] INVASIVE CV LAB;  Service: Cardiovascular;  Laterality: N/A;  . right hip surgery    . right shoulder surgery       A IV Location/Drains/Wounds Patient Lines/Drains/Airways Status   Active Line/Drains/Airways    Name:   Placement date:   Placement time:   Site:   Days:   Peripheral IV 09/14/18 Left Hand   09/14/18    1143    Hand   less than 1          Intake/Output Last 24 hours  Intake/Output Summary (Last 24 hours) at 09/14/2018 1525 Last data filed at 09/14/2018 0909 Gross per 24 hour  Intake -  Output 300 ml  Net -300 ml    Labs/Imaging Results for orders placed or performed during the hospital encounter of 09/14/18 (from the past 48 hour(s))  CBC     Status: Abnormal   Collection Time: 09/14/18  4:40 AM  Result Value Ref Range   WBC 6.4 4.0 - 10.5 K/uL   RBC 4.27 4.22 - 5.81 MIL/uL   Hemoglobin 12.5 (L) 13.0 - 17.0 g/dL   HCT 19.1 (L) 47.8 - 29.5 %   MCV 89.0 80.0 - 100.0 fL   MCH 29.3 26.0 - 34.0 pg   MCHC 32.9 30.0 - 36.0 g/dL   RDW 62.1 (H) 30.8 - 65.7 %   Platelets 291 150 - 400 K/uL   nRBC  0.0 0.0 - 0.2 %    Comment: Performed at [facility], [address] [place], [place] [address]  Basic metabolic panel     Status: Abnormal   Collection Time: 09/14/18  4:40 AM  Result Value Ref Range   Sodium 142 135 - 145 mmol/L   Potassium 3.4 (L) 3.5 - 5.1 mmol/L   Chloride 104 98 - 111 mmol/L   CO2 24 22 - 32 mmol/L   Glucose, Bld 121 (H) 70 - 99 mg/dL   BUN 11 6 - 20 mg/dL   Creatinine, Ser 2.59 0.61 - 1.24 mg/dL   Calcium 8.0 (L) 8.9 - 10.3 mg/dL   GFR calc non Af Amer >60 >60 mL/min   GFR calc Af Amer >60 >60 mL/min   Anion gap 14 5 - 15    Comment: Performed at [facility], [address] [place], [place]  [address]  Ethanol     Status: Abnormal   Collection Time: 09/14/18  4:40 AM  Result Value Ref Range   Alcohol, Ethyl (B) 231 (H) <10 mg/dL    Comment: (NOTE) Lowest detectable limit for serum alcohol is 10 mg/dL. For medical purposes only. Performed at [facility], [address] [place], [place] [address]   Acetaminophen level     Status: Abnormal   Collection Time: 09/14/18  4:40 AM  Result Value Ref Range   Acetaminophen (Tylenol), Serum <10 (L) 10 - 30 ug/mL    Comment: (NOTE) Therapeutic concentrations vary significantly. A range of 10-30 ug/mL  may be an effective concentration for many patients. However, some  are best treated at concentrations outside of this range. Acetaminophen concentrations >150 ug/mL at 4 hours after ingestion  and >50 ug/mL at 12 hours after ingestion are often associated with  toxic reactions. Performed at [facility], [address] [place], [place] [address]   Salicylate level     Status: None   Collection Time: 09/14/18  4:40 AM  Result Value Ref Range   Salicylate Lvl <7.0 2.8 - 30.0 mg/dL    Comment: Performed at [facility], [address] [place], [place] [address]  Glucose, capillary     Status: None   Collection Time: 09/14/18  7:56 AM  Result Value Ref Range   Glucose-Capillary 96 70 - 99 mg/dL  Urine Drug Screen, Qualitative ([facility] only)     Status: Abnormal   Collection Time: 09/14/18  9:06 AM  Result Value Ref Range   Tricyclic, Ur Screen NONE DETECTED NONE DETECTED   Amphetamines, Ur Screen NONE DETECTED NONE DETECTED   MDMA (Ecstasy)Ur Screen NONE DETECTED NONE DETECTED   Cocaine Metabolite,Ur [place] NONE DETECTED NONE DETECTED   Opiate, Ur Screen NONE DETECTED NONE DETECTED   Phencyclidine (PCP) Ur S NONE DETECTED NONE DETECTED   Cannabinoid 50 Ng, Ur [place] NONE DETECTED NONE DETECTED   Barbiturates, Ur Screen NONE DETECTED NONE DETECTED   Benzodiazepine, Ur Scrn POSITIVE (A) NONE DETECTED    Methadone Scn, Ur NONE DETECTED NONE DETECTED    Comment: (NOTE) Tricyclics + metabolites, urine    Cutoff 1000 ng/mL Amphetamines + metabolites, urine  Cutoff 1000 ng/mL MDMA (Ecstasy), urine              Cutoff 500 ng/mL Cocaine Metabolite, urine          Cutoff 300 ng/mL Opiate + metabolites, urine        Cutoff 300 ng/mL Phencyclidine (PCP), urine  Cutoff 25 ng/mL Cannabinoid, urine                 Cutoff 50 ng/mL Barbiturates + metabolites, urine  Cutoff 200 ng/mL Benzodiazepine, urine              Cutoff 200 ng/mL Methadone, urine                   Cutoff 300 ng/mL The urine drug screen provides only a preliminary, unconfirmed analytical test result and should not be used for non-medical purposes. Clinical consideration and professional judgment should be applied to any positive drug screen result due to possible interfering substances. A more specific alternate chemical method must be used in order to obtain a confirmed analytical result. Gas chromatography / mass spectrometry (GC/MS) is the preferred confirmat ory method. Performed at [facility], [address] [place], [place] [address]   SARS Coronavirus 2 [facility] order, Performed in [facility] hospital lab)     Status: None   Collection Time: 09/14/18  1:01 PM  Result Value Ref Range   SARS Coronavirus 2 NEGATIVE NEGATIVE    Comment: (NOTE) If result is NEGATIVE SARS-CoV-2 target nucleic acids are NOT DETECTED. The SARS-CoV-2 RNA is generally detectable in upper and lower  respiratory specimens during the acute phase of infection. The lowest  concentration of SARS-CoV-2 viral copies this assay can detect is 250  copies / mL. A negative result does not preclude SARS-CoV-2 infection  and should not be used as the sole basis for treatment or other  patient management decisions.  A negative result may occur with  improper specimen collection / handling, submission of specimen other  than  nasopharyngeal swab, presence of viral mutation(s) within the  areas targeted by this assay, and inadequate number of viral copies  (<250 copies / mL). A negative result must be combined with clinical  observations, patient history, and epidemiological information. If result is POSITIVE SARS-CoV-2 target nucleic acids are DETECTED. The SARS-CoV-2 RNA is generally detectable in upper and lower  respiratory specimens dur ing the acute phase of infection.  Positive  results are indicative of active infection with SARS-CoV-2.  Clinical  correlation with patient history and other diagnostic information is  necessary to determine patient infection status.  Positive results do  not rule out bacterial infection or co-infection with other viruses. If result is PRESUMPTIVE POSTIVE SARS-CoV-2 nucleic acids MAY BE PRESENT.   A presumptive positive result was obtained on the submitted specimen  and confirmed on repeat testing.  While 2019 novel coronavirus  (SARS-CoV-2) nucleic acids may be present in the submitted sample  additional confirmatory testing may be necessary for epidemiological  and / or clinical management purposes  to differentiate between  SARS-CoV-2 and other Sarbecovirus currently known to infect humans.  If clinically indicated additional testing with an alternate test  methodology [phone]) is advised. The SARS-CoV-2 RNA is generally  detectable in upper and lower respiratory sp ecimens during the acute  phase of infection. The expected result is Negative. Fact Sheet for Patients:  BoilerBrush.com.cy Fact Sheet for Healthcare Providers: https://pope.com/ This test is not yet approved or cleared by the [place] FDA and has been authorized for detection and/or diagnosis of SARS-CoV-2 by FDA under an Emergency Use Authorization (EUA).  This EUA will remain in effect (meaning this test can be used) for the duration of  the COVID-19 declaration under Section 564(b)(1) of the Act, 21 U.S.C. section 360bbb-3(b)(1), unless the authorization is  terminated or revoked sooner. Performed at [facility], [address] [place], [place] [address]    No results found.  Pending Labs [employer] (From admission, onward)    Start     Ordered   Signed and Armed forces training and education officer morning,   R     Signed and Held   Signed and Held  CBC  Tomorrow morning,   R     Signed and Held   Signed and Held  CBC  (heparin)  Once,   R    Comments:  Baseline for heparin therapy IF NOT ALREADY DRAWN.  Notify MD if PLT < 100 K.    Signed and Held   Signed and Held  Creatinine, serum  (heparin)  Once,   R    Comments:  Baseline for heparin therapy IF NOT ALREADY DRAWN.    Signed and Held          Vitals/Pain Today's Vitals   09/14/18 1303 09/14/18 1428 09/14/18 1519 09/14/18 1523  BP: 130/80 118/88 118/88 118/88  Pulse: (!) 127 (!) 129 100 98  Resp:      Temp:      TempSrc:      SpO2:  99% 99%   Weight:      Height:      PainSc:        Isolation Precautions Droplet and Contact precautions  Medications Medications  nicotine (NICODERM CQ - dosed in mg/24 hours) patch 21 mg (21 mg Transdermal Patch Applied 09/14/18 0445)  thiamine (VITAMIN B-1) tablet 100 mg ( Oral Canceled Entry 09/14/18 1012)  metoprolol succinate (TOPROL-XL) 24 hr tablet 50 mg (50 mg Oral Given 09/14/18 0756)  LORazepam (ATIVAN) tablet 1 mg (1 mg Oral Given 09/14/18 1026)    Or  LORazepam (ATIVAN) injection 1 mg ( Intravenous See Alternative 09/14/18 1026)  thiamine (VITAMIN B-1) tablet 100 mg (100 mg Oral Given 09/14/18 0959)    Or  thiamine (B-1) injection 100 mg ( Intravenous See Alternative 09/14/18 0959)  folic acid (FOLVITE) tablet 1 mg (1 mg Oral Given 09/14/18 0959)  multivitamin with minerals tablet 1 tablet (1 tablet Oral Given 09/14/18 0958)  LORazepam (ATIVAN) tablet 1 mg (has no administration in time range)    Or   LORazepam (ATIVAN) injection 1 mg (has no administration in time range)  thiamine (VITAMIN B-1) tablet 100 mg (has no administration in time range)    Or  thiamine (B-1) injection 100 mg (has no administration in time range)  folic acid (FOLVITE) tablet 1 mg (has no administration in time range)  multivitamin with minerals tablet 1 tablet (has no administration in time range)  diazepam (VALIUM) tablet 2 mg (has no administration in time range)  LORazepam (ATIVAN) injection 2 mg (has no administration in time range)  oxyCODONE (Oxy IR/ROXICODONE) immediate release tablet 10 mg (has no administration in time range)  insulin aspart (novoLOG) injection 0-9 Units (has no administration in time range)  insulin aspart (novoLOG) injection 0-5 Units (has no administration in time range)  diltiazem (CARDIZEM CD) 24 hr capsule 120 mg (120 mg Oral Given 09/14/18 0831)  metFORMIN (GLUCOPHAGE) tablet 500 mg (500 mg Oral Given 09/14/18 0756)  LORazepam (ATIVAN) tablet 1.5 mg (1.5 mg Oral Given 09/14/18 0756)  LORazepam (ATIVAN) injection 2 mg (2 mg Intravenous Given 09/14/18 1143)    Mobility walks with device High fall risk   Focused Assessments Last CIWA of 4   R Recommendations: See Admitting  Provider Note  Report given to:   Additional Notes:

## 2018-09-15 DIAGNOSIS — F10239 Alcohol dependence with withdrawal, unspecified: Secondary | ICD-10-CM | POA: Diagnosis not present

## 2018-09-15 DIAGNOSIS — F1010 Alcohol abuse, uncomplicated: Secondary | ICD-10-CM | POA: Diagnosis not present

## 2018-09-15 LAB — CBC
HCT: 40.2 % (ref 39.0–52.0)
Hemoglobin: 12.9 g/dL — ABNORMAL LOW (ref 13.0–17.0)
MCH: 29.5 pg (ref 26.0–34.0)
MCHC: 32.1 g/dL (ref 30.0–36.0)
MCV: 91.8 fL (ref 80.0–100.0)
Platelets: 268 10*3/uL (ref 150–400)
RBC: 4.38 MIL/uL (ref 4.22–5.81)
RDW: 18.8 % — ABNORMAL HIGH (ref 11.5–15.5)
WBC: 14.1 10*3/uL — ABNORMAL HIGH (ref 4.0–10.5)
nRBC: 0 % (ref 0.0–0.2)

## 2018-09-15 LAB — BASIC METABOLIC PANEL
Anion gap: 9 (ref 5–15)
BUN: 9 mg/dL (ref 6–20)
CO2: 27 mmol/L (ref 22–32)
Calcium: 9.1 mg/dL (ref 8.9–10.3)
Chloride: 101 mmol/L (ref 98–111)
Creatinine, Ser: 0.76 mg/dL (ref 0.61–1.24)
GFR calc Af Amer: >60 mL/min (ref >60)
GFR calc non Af Amer: >60 mL/min (ref >60)
Glucose, Bld: 114 mg/dL — ABNORMAL HIGH (ref 70–99)
Potassium: 3.4 mmol/L — ABNORMAL LOW (ref 3.5–5.1)
Sodium: 137 mmol/L (ref 135–145)

## 2018-09-15 LAB — GLUCOSE, CAPILLARY
Glucose-Capillary: 117 mg/dL — ABNORMAL HIGH (ref 70–99)
Glucose-Capillary: 111 mg/dL — ABNORMAL HIGH (ref 70–99)

## 2018-09-15 LAB — MAGNESIUM: Magnesium: 1.9 mg/dL (ref 1.7–2.4)

## 2018-09-15 MED ORDER — POTASSIUM CHLORIDE CRYS ER 20 MEQ PO TBCR
40.0000 meq | EXTENDED_RELEASE_TABLET | Freq: Once | ORAL | Status: AC
  Administered 2018-09-15: 40 meq via ORAL
  Filled 2018-09-15: qty 2

## 2018-09-15 MED ORDER — DIAZEPAM 2 MG PO TABS: 2.0000 mg | ORAL_TABLET | Freq: Three times a day (TID) | ORAL | 0 refills | Status: DC

## 2018-09-15 NOTE — Progress Notes (Signed)
PT Cancellation Note  Patient Details Name: Aodhan Mccalley MRN: [id number] DOB: 05/10/1960   Cancelled Treatment:    Reason Eval/Treat Not Completed: Other (comment);Patient declined, no reason specified(While this PT was chart reviewing in preparation to see patient for initial eval, nursing called to say patient had refused PT and was currently discharging from hospital.)   Luretha Murphy. Ilsa Iha, PT, DPT 09/15/18, 1:51 PM

## 2018-09-15 NOTE — Progress Notes (Signed)
Patient refused to wait for PT for evaluation. Stated he didn't want to wait and wanted to leave to get his meds and go home.

## 2018-09-15 NOTE — TOC Transition Note (Signed)
Transition of Care [facility]) - CM/SW Discharge Note   Patient Details  Name: Kurt Horton MRN: [id number] Date of Birth: March 13, 1961  [facility] of [facility]) CM/SW Contact:  Virgel Manifold, RN Phone Number: 09/15/2018, 1:45 PM   Clinical Narrative:   Patient to be discharged per MD order. Orders in place for home health services. Patient without insurance and would need charity care. Patient has refused in the past. Notified [place] from [facility] of referral. Patient has cane in the home. Patient to take cab home and be cared for under his mother. CSW has spend significant time overviewing resources etc.     Final next level of care: Home/Self Care Barriers to Discharge: Active Substance Use - Placement   Patient Goals and CMS Choice Patient states their goals for this hospitalization and ongoing recovery are:: "I want to get into alcohol detox." CMS Medicare.gov Compare Post Acute Care list provided to:: Other (Comment Required)(N/A) Choice offered to / list presented to : NA  Discharge Placement                       Discharge Plan and Services In-house Referral: NA Discharge Planning Services: NA Post Acute Care Choice: NA          DME Arranged: N/A DME Agency: NA       HH Arranged: NA HH Agency: NA        Social Determinants of Health (SDOH) Interventions     Readmission Risk Interventions Readmission Risk Prevention Plan 09/15/2018 08/19/2018  Transportation Screening Complete Complete  Medication Review Oceanographer) Complete Complete  PCP or Specialist appointment within 3-5 days of discharge Patient refused Complete  HRI or Home Care Consult Patient refused Complete  SW Recovery Care/Counseling Consult Complete Complete  Palliative Care Screening Not Applicable Not Applicable  Skilled Nursing Facility Not Applicable Not Applicable  Some recent data might be hidden

## 2018-09-15 NOTE — TOC Initial Note (Addendum)
Transition of Care [facility]) - Initial/Assessment Note    Patient Details  Name: Kurt Horton MRN: [id number] Date of Birth: 19-Feb-1961  Transition of Care [facility]) CM/SW Contact:    Judi Cong, LCSW Phone Number: 09/15/2018, 11:45 AM  Clinical Narrative:   The patient admitted from the [facility] ED for ETOH withdrawal protocols. The patient is A&Ox4. The patient was declined by [facility] and [facility] due to medical acuity and lack of beds. The patient does not have a payor source for residential rehabilitation.  The CSW spoke with Kurt Horton about contacting [employer] to gain assistance with locating treatment in the community under IPRS. Kurt Horton presented as angry that he is being discharged. The CSW explained that he is not showing medical necessity to remain inpatient and that PT has been ordered to evaluate him prior to discharge. Kurt Horton admitted that he is not homeless; however, he feels that the decision to discharge is "not fair or considerate." The CSW provided emotional support and continued to explain that the decision is based on medical necessity. The CSW offered to provide the [employer] 24 hour line for the patient to contact, and the CSW empowered the patient to use self-advocation when contacting the resource. The patient asked that the information be provided as part of his discharge paperwork. The CSW will advise the patient's attending RN of the number [phone]). The patient also admitted that he has a letter from Kurt Horton at home with this information.  The patient is an extreme risk of readmission, mainly due to his alcohol use and high utilization of ED and inpatient admissions. The patient has a history of declining all offered resources. Today, the patient's main barriers are 1. Payor source and 2. Insight (feeling that residential treatment is the only option and refusing to try outpatient or self-help ([facility]) while waiting for options to become  available).  The patient has had visits from financial counselors at each admission to assist him in applying for Medicaid. The patient shared that he has not gained Medicaid since his last admission.  UPDATE: The patient's parents have called the unit and refused for the patient to return to his home. Due to his home health needs, he cannot be accepted at the local shelter, and he has no ability to pay for a hotel. The patient may not be safe to discharge without assistance. CSW has updated the attending RN of this situation.  UPDATE: The patient's family is now agreeable to the patient returning home. As this is a safe option for discharge, and the patient is agreeable to this plan, he will discharge home by taxi. The RNCM has alerted the current charity care home health agency of possible need for charity care home health and DME. The CSW is signing off. Please consult should needs arise.        Expected Discharge Plan: Home/Self Care Barriers to Discharge: Active Substance Use - Placement   Patient Goals and CMS Choice Patient states their goals for this hospitalization and ongoing recovery are:: "I want to get into alcohol detox." CMS Medicare.gov Compare Post Acute Care list provided to:: Other (Comment Required)(N/A) Choice offered to / list presented to : NA  Expected Discharge Plan and Services Expected Discharge Plan: Home/Self Care In-house Referral: NA Discharge Planning Services: NA Post Acute Care Choice: NA Living arrangements for the past 2 months: Single Family Home Expected Discharge Date: 09/15/18  DME Arranged: N/A DME Agency: NA       HH Arranged: NA HH Agency: NA        Prior Living Arrangements/Services Living arrangements for the past 2 months: Single Family Home Lives with:: Self Patient language and need for interpreter reviewed:: Yes Do you feel safe going back to the place where you live?: Yes      Need for Family Participation in  Patient Care: No (Comment) Care giver support system in place?: Yes (comment)   Criminal Activity/Legal Involvement Pertinent to Current Situation/Hospitalization: No - Comment as needed  Activities of Daily Living Home Assistive Devices/Equipment: Cane (specify quad or straight), Walker (specify type) ADL Screening (condition at time of admission) Patient's cognitive ability adequate to safely complete daily activities?: Yes Is the patient deaf or have difficulty hearing?: No Does the patient have difficulty seeing, even when wearing glasses/contacts?: No Does the patient have difficulty concentrating, remembering, or making decisions?: No Patient able to express need for assistance with ADLs?: Yes Does the patient have difficulty dressing or bathing?: No Independently performs ADLs?: No Communication: Independent Dressing (OT): Needs assistance Is this a change from baseline?: Pre-admission baseline Grooming: Needs assistance Is this a change from baseline?: Pre-admission baseline Feeding: Needs assistance Is this a change from baseline?: Pre-admission baseline Bathing: Needs assistance Is this a change from baseline?: Pre-admission baseline Toileting: Needs assistance Is this a change from baseline?: Pre-admission baseline In/Out Bed: Needs assistance Is this a change from baseline?: Pre-admission baseline Walks in Home: Needs assistance Is this a change from baseline?: Pre-admission baseline Does the patient have difficulty walking or climbing stairs?: Yes Weakness of Legs: Both Weakness of Arms/Hands: Both  Permission Sought/Granted                  Emotional Assessment Appearance:: Appears stated age Attitude/Demeanor/Rapport: Angry, Reactive, Complaining Affect (typically observed): Defensive, Agitated, Guarded, Angry, Frustrated, Irritable Orientation: : Oriented to Self, Oriented to Place, Oriented to  Time, Oriented to Situation Alcohol / Substance Use:  Alcohol Use Psych Involvement: Yes (comment)  Admission diagnosis:  Alcohol abuse [F10.10] Patient Active Problem List   Diagnosis Date Noted  . Alcohol withdrawal (HCC) 08/26/2018  . A-fib (HCC) 08/17/2018  . Tobacco use disorder 05/27/2018  . GI bleed 02/04/2018  . Alcohol intoxication (HCC) 12/22/2017  . Malnutrition of moderate degree December 22, 202019  . Chronic combined systolic and diastolic CHF (congestive heart failure) (HCC) 08/13/2017  . Alcohol withdrawal delirium (HCC) 02/26/2017  . Atrial fibrillation with RVR (HCC) 02/25/2017  . Acute alcoholic intoxication with complication (HCC)   . Alcohol use disorder, severe, dependence (HCC)   . Substance induced mood disorder (HCC) 11/13/2016  . SVT (supraventricular tachycardia) (HCC) 11/12/2016  . HTN (hypertension) 11/12/2016  . EtOH dependence (HCC) 11/12/2016  . Lumbar radiculopathy 08/24/2016  . Chronic low back pain 01/04/2016  . Pressure ulcer 12/29/2015  . Osteomyelitis (HCC) 12/26/2015  . PVD (peripheral vascular disease) (HCC) 12/26/2015  . Type II diabetes mellitus with manifestations (HCC) 12/26/2015  . Myasthenia gravis (HCC) 12/26/2015   PCP:  Barbette ReichmannHande, Vishwanath, MD Pharmacy:   [facility] [place], [place] - [address] [place] [address] Phone: [phone] Fax: [phone]  [facility] [place] - [address] [place] [address] Phone: [phone] Fax: [phone]  [id number] - [place] - [address] [facility] [address] [place] [address] Phone: [phone] Fax: [phone]     Social Determinants of Health (SDOH) Interventions    Readmission Risk Interventions Readmission Risk Prevention Plan 09/15/2018 08/19/2018  Transportation Screening Complete Complete  Medication Review Oceanographer) Complete Complete  PCP or Specialist appointment within 3-5 days of  discharge Patient refused Complete  HRI or Home Care Consult Patient refused Complete  SW Recovery Care/Counseling Consult Complete Complete  Palliative Care Screening Not Applicable Not Applicable  Skilled Nursing Facility Not Applicable Not Applicable  Some recent data might be hidden

## 2018-09-15 NOTE — Discharge Summary (Signed)
[facility] [place] [facility]   PATIENT NAME: Kurt Horton    MR#:  [id number]  DATE OF BIRTH:  May 08, 1961  DATE OF ADMISSION:  09/14/2018 ADMITTING PHYSICIAN: Altamese Dilling, MD  DATE OF DISCHARGE: 5/10/202  PRIMARY CARE PHYSICIAN: [name], MD    ADMISSION DIAGNOSIS:  Alcohol abuse [F10.10]  DISCHARGE DIAGNOSIS:  Active Problems:   Alcohol withdrawal (HCC)   SECONDARY DIAGNOSIS:   Past Medical History:  Diagnosis Date  . A-fib (HCC)   . Alcohol abuse   . Alcohol withdrawal (HCC) 11/12/2016  . Diabetes mellitus without complication (HCC)   . DJD (degenerative joint disease)   . Hypertension   . Myasthenia gravis (HCC)   . Renal disorder     HOSPITAL COURSE:   58 year old male with history of tobacco and EtOH dependence with many visits to the emergency room and admissions to the hospital for EtOH who presents to the emergency room again due to EtOH withdrawal.  1.  EtOH withdrawal: Patient has had several hospitalizations and ER visits.  He has had consultations with physical therapy as well as case management. Patient is encouraged to remain abstinent. He will be on Valium for 5 days at discharge.  2.  Hypokalemia: Magnesium level was normal.  This was repleted.  3.  Diabetes: Continue ADA diet with outpatient regimen  4.  PAF: Patient will continue on Cardizem and metoprolol.  Patient is not a candidate for anticoagulation due to EtOH abuse.  5.Tobacco dependence: Patient is encouraged to quit smoking and willing to attempt to quit was assessed. Patient highly motivated.Counseling was provided for 4 minutes.     DISCHARGE CONDITIONS AND DIET:  Stable Diabetic diet  CONSULTS OBTAINED:    DRUG ALLERGIES:   Allergies  Allergen Reactions  . Nsaids Other (See Comments)    Duodenal ulcers, GI bleeding  . Other Other (See Comments)    Patient states he can't take any "mycin" medications because myasthenia gravis.     DISCHARGE MEDICATIONS:   Allergies as of 09/15/2018      Reactions   Nsaids Other (See Comments)   Duodenal ulcers, GI bleeding   Other Other (See Comments)   Patient states he can't take any "mycin" medications because myasthenia gravis.      Medication List    STOP taking these medications   amoxicillin-clavulanate 875-125 MG tablet Commonly known as:  AUGMENTIN     TAKE these medications   cloNIDine 0.1 mg/24hr patch Commonly known as:  CATAPRES - Dosed in mg/24 hr Place 1 patch onto the skin once a week.   diazepam 2 MG tablet Commonly known as:  VALIUM Take 1 tablet (2 mg total) by mouth 3 (three) times daily before meals for 5 days.   diltiazem 120 MG 24 hr capsule Commonly known as:  CARDIZEM CD Take 1 capsule (120 mg total) by mouth daily.   folic acid 1 MG tablet Commonly known as:  FOLVITE Take 1 tablet (1 mg total) by mouth daily for 30 days.   guaiFENesin-dextromethorphan 100-10 MG/5ML syrup Commonly known as:  ROBITUSSIN DM Take 5 mLs by mouth every 4 (four) hours as needed for cough.   hydrOXYzine 25 MG tablet Commonly known as:  ATARAX/VISTARIL Take 1 tablet (25 mg total) by mouth 3 (three) times daily as needed for anxiety.   metFORMIN 500 MG tablet Commonly known as:  GLUCOPHAGE Take 1 tablet (500 mg total) by mouth daily.   metoprolol succinate 50 MG 24 hr  tablet Commonly known as:  TOPROL-XL Take 1 tablet (50 mg total) by mouth daily. Take with or immediately following a meal.   multivitamin with minerals Tabs tablet Take 1 tablet by mouth daily.   nicotine 14 mg/24hr patch Commonly known as:  NICODERM CQ - dosed in mg/24 hours Place 1 patch (14 mg total) onto the skin daily.   Oxycodone HCl 10 MG Tabs Take 10 mg by mouth daily.   senna-docusate 8.6-50 MG tablet Commonly known as:  Senokot-S Take 1 tablet by mouth at bedtime as needed for mild constipation.   testosterone cypionate 200 MG/ML injection Commonly known as:   DEPOTESTOSTERONE CYPIONATE Inject 200 mg into the muscle every 14 (fourteen) days.   thiamine 100 MG tablet Take 1 tablet (100 mg total) by mouth daily for 30 days.   traZODone 100 MG tablet Commonly known as:  DESYREL Take 1 tablet (100 mg total) by mouth at bedtime.         Today   CHIEF COMPLAINT:  Patient wants to stay in hospital so he will not drink Etoh   VITAL SIGNS:  Blood pressure (!) 147/95, pulse 80, temperature (!) 97.3 F (36.3 C), temperature source Oral, resp. rate 18, height  (1.803 m), weight 84 kg, SpO2 100 %.   REVIEW OF SYSTEMS:  Review of Systems  Constitutional: Negative.  Negative for chills, fever and malaise/fatigue.  HENT: Negative.  Negative for ear discharge, ear pain, hearing loss, nosebleeds and sore throat.   Eyes: Negative.  Negative for blurred vision and pain.  Respiratory: Negative.  Negative for cough, hemoptysis, shortness of breath and wheezing.   Cardiovascular: Negative.  Negative for chest pain, palpitations and leg swelling.  Gastrointestinal: Negative.  Negative for abdominal pain, blood in stool, diarrhea, nausea and vomiting.  Genitourinary: Negative.  Negative for dysuria.  Musculoskeletal: Negative.  Negative for back pain.  Skin: Negative.   Neurological: Positive for tremors. Negative for dizziness, speech change, focal weakness, seizures and headaches.  Endo/Heme/Allergies: Negative.  Does not bruise/bleed easily.  Psychiatric/Behavioral: Negative.  Negative for depression, hallucinations and suicidal ideas.     PHYSICAL EXAMINATION:  GENERAL:  58 y.o.-year-old patient lying in the bed with no acute distress.  NECK:  Supple, no jugular venous distention. No thyroid enlargement, no tenderness.  LUNGS: Normal breath sounds bilaterally, no wheezing, rales,rhonchi  No use of accessory muscles of respiration.  CARDIOVASCULAR: S1, S2 normal. No murmurs, rubs, or gallops.  ABDOMEN: Soft, non-tender, non-distended.  Bowel sounds present. No organomegaly or mass.  EXTREMITIES: No pedal edema, cyanosis, or clubbing.  PSYCHIATRIC: The patient is alert and oriented x 3.  SKIN: No obvious rash, lesion, or ulcer.  +mild tremors at baseline   DATA REVIEW:   CBC Recent Labs  Lab 09/15/18 0453  WBC 14.1*  HGB 12.9*  HCT 40.2  PLT 268    Chemistries  Recent Labs  Lab 09/10/18 1425  09/15/18 0453  NA 140   < > 137  K 4.5   < > 3.4*  CL 100   < > 101  CO2 27   < > 27  GLUCOSE 117*   < > 114*  BUN 7   < > 9  CREATININE 0.55*   < > 0.76  CALCIUM 8.5*   < > 9.1  MG  --   --  1.9  AST 28  --   --   ALT 20  --   --   ALKPHOS 72  --   --  BILITOT 0.8  --   --    < > = values in this interval not displayed.    Cardiac Enzymes No results for input(s): TROPONINI in the last 168 hours.  Microbiology Results  @MICRORSLT48 @  RADIOLOGY:  No results found.    Allergies as of 09/15/2018      Reactions   Nsaids Other (See Comments)   Duodenal ulcers, GI bleeding   Other Other (See Comments)   Patient states he can't take any "mycin" medications because myasthenia gravis.      Medication List    STOP taking these medications   amoxicillin-clavulanate 875-125 MG tablet Commonly known as:  AUGMENTIN     TAKE these medications   cloNIDine 0.1 mg/24hr patch Commonly known as:  CATAPRES - Dosed in mg/24 hr Place 1 patch onto the skin once a week.   diazepam 2 MG tablet Commonly known as:  VALIUM Take 1 tablet (2 mg total) by mouth 3 (three) times daily before meals for 5 days.   diltiazem 120 MG 24 hr capsule Commonly known as:  CARDIZEM CD Take 1 capsule (120 mg total) by mouth daily.   folic acid 1 MG tablet Commonly known as:  FOLVITE Take 1 tablet (1 mg total) by mouth daily for 30 days.   guaiFENesin-dextromethorphan 100-10 MG/5ML syrup Commonly known as:  ROBITUSSIN DM Take 5 mLs by mouth every 4 (four) hours as needed for cough.   hydrOXYzine 25 MG tablet Commonly  known as:  ATARAX/VISTARIL Take 1 tablet (25 mg total) by mouth 3 (three) times daily as needed for anxiety.   metFORMIN 500 MG tablet Commonly known as:  GLUCOPHAGE Take 1 tablet (500 mg total) by mouth daily.   metoprolol succinate 50 MG 24 hr tablet Commonly known as:  TOPROL-XL Take 1 tablet (50 mg total) by mouth daily. Take with or immediately following a meal.   multivitamin with minerals Tabs tablet Take 1 tablet by mouth daily.   nicotine 14 mg/24hr patch Commonly known as:  NICODERM CQ - dosed in mg/24 hours Place 1 patch (14 mg total) onto the skin daily.   Oxycodone HCl 10 MG Tabs Take 10 mg by mouth daily.   senna-docusate 8.6-50 MG tablet Commonly known as:  Senokot-S Take 1 tablet by mouth at bedtime as needed for mild constipation.   testosterone cypionate 200 MG/ML injection Commonly known as:  DEPOTESTOSTERONE CYPIONATE Inject 200 mg into the muscle every 14 (fourteen) days.   thiamine 100 MG tablet Take 1 tablet (100 mg total) by mouth daily for 30 days.   traZODone 100 MG tablet Commonly known as:  DESYREL Take 1 tablet (100 mg total) by mouth at bedtime.           Management plans discussed with the patient and he is in agreement. Stable for discharge   Patient should follow up with pcp  CODE STATUS:     Code Status Orders  (From admission, onward)         Start     Ordered   09/14/18 1558  Full code  Continuous     09/14/18 1557        Code Status History    Date Active Date Inactive Code Status Order ID Comments User Context   08/26/2018 1055 08/28/2018 1423 Full Code [id number]  Milagros Loll, MD ED   08/26/2018 [phone] 08/26/2018 1055 Full Code [id number]  Arnaldo Natal, MD ED   08/17/2018 1924 08/20/2018 1838 Full Code [id number]  Milagros LollSudini, Srikar, MD ED   06/24/2018 1438 06/28/2018 2141 Full Code [id number]  Altamese DillingVachhani, Vaibhavkumar, MD ED   06/24/2018 1207 06/24/2018 1438 Full Code [id number]  Arnaldo NatalMalinda, Paul F, MD ED   06/12/2018 0350 06/13/2018  0101 Full Code [id number]  Darci CurrentBrown, [place] N, MD ED   05/25/2018 1609 05/30/2018 1913 Full Code [id number]  Luciano CutterSmith, Justin R, DO Inpatient   05/17/2018 0121 05/21/2018 1635 Full Code [id number]  Mayo, Allyn KennerKaty Dodd, MD Inpatient   04/15/2018 1145 04/19/2018 2132 Full Code [id number]  Delfino LovettShah, Vipul, MD Inpatient   04/03/2018 0547 04/10/2018 2052 Full Code [id number]  Barbaraann RondoSridharan, Prasanna, MD ED   02/04/2018 1528 02/07/2018 1728 Full Code [id number]  Mayo, Allyn KennerKaty Dodd, MD ED   02/03/2018 2233 02/04/2018 0841 Full Code [id number]  Jene EveryKinner, Robert, MD ED   01/20/2018 0140 01/22/2018 1819 Full Code [id number]  Barbaraann RondoSridharan, Prasanna, MD ED   01/19/2018 1239 01/20/2018 0140 Full Code [id number]  Myrna BlazerSchaevitz, David Matthew, MD ED   12/22/2017 0841 12/25/2017 2031 Full Code [id number]  Barbaraann RondoSridharan, Prasanna, MD Inpatient   10/27/2017 1522 11/02/2017 1432 Full Code [id number]  Milagros LollSudini, Srikar, MD ED   10/21/2017 1945 10/23/2017 1833 Full Code [id number]  Auburn BilberryPatel, Shreyang, MD Inpatient   10/21/2017 1504 10/21/2017 1945 Full Code [id number]  Arnaldo NatalMalinda, Paul F, MD ED   10/10/2017 1314 10/11/2017 1147 Full Code [id number]  Willy Eddyobinson, Patrick, MD ED   10/05/2017 2214 10/07/2017 2101 Full Code [id number]  Arnaldo NatalMalinda, Paul F, MD ED   10/05/2017 1924 10/05/2017 2214 Full Code [id number]  Arnaldo NatalMalinda, Paul F, MD ED   10/01/2017 0807 30-Oct-202019 1650 Full Code [id number]  Barbaraann RondoSridharan, Prasanna, MD Inpatient   08/24/2017 1756 08/27/2017 1849 Full Code [id number]  Enid BaasKalisetti, Radhika, MD ED   08/14/2017 0030 08/18/2017 1605 Full Code [id number]  Oralia ManisWillis, David, MD Inpatient   06/04/2017 0116 06/06/2017 1701 Full Code [id number]  Oralia ManisWillis, David, MD Inpatient   03/19/2017 1559 03/20/2017 2128 Full Code [id number]  Arnaldo NatalMalinda, Paul F, MD ED   02/25/2017 1421 03/01/2017 1829 Full Code [id number]  Houston SirenSainani, Vivek J, MD Inpatient   11/12/2016 1533 11/14/2016 1425 Full Code [id number]  Marguarite ArbourSparks, Jeffrey D, MD Inpatient   12/26/2015 1927 12/30/2015 1604 Full Code [id number]  Marguarite ArbourSparks, Jeffrey D, MD Inpatient      TOTAL TIME  TAKING CARE OF THIS PATIENT: 38 minutes.    Note: This dictation was prepared with Dragon dictation along with smaller phrase technology. Any transcriptional errors that result from this process are unintentional.  Adrian SaranSital Stana Bayon M.D on 09/15/2018 at 11:38 AM  Between 7am to 6pm - Pager - [phone] After 6pm go to [email or web address] - Social research officer, governmentpassword [email or web address] [email or web address]  [facility]  Office  [phone]  CC: Primary care physician; Barbette ReichmannHande, Vishwanath, MD

## 2018-09-16 ENCOUNTER — Other Ambulatory Visit: Payer: Self-pay

## 2018-09-16 ENCOUNTER — Emergency Department
Admission: EM | Admit: 2018-09-16 | Discharge: 2018-09-16 | Disposition: A | Discharge status: Home / Self Care | Payer: Medicare Other | Source: Home / Self Care | Attending: Emergency Medicine | Admitting: Emergency Medicine

## 2018-09-16 ENCOUNTER — Encounter: Payer: Self-pay | Admitting: Emergency Medicine

## 2018-09-16 DIAGNOSIS — F10230 Alcohol dependence with withdrawal, uncomplicated: Secondary | ICD-10-CM | POA: Insufficient documentation

## 2018-09-16 DIAGNOSIS — E119 Type 2 diabetes mellitus without complications: Secondary | ICD-10-CM | POA: Insufficient documentation

## 2018-09-16 DIAGNOSIS — I4891 Unspecified atrial fibrillation: Secondary | ICD-10-CM | POA: Insufficient documentation

## 2018-09-16 DIAGNOSIS — R7989 Other specified abnormal findings of blood chemistry: Secondary | ICD-10-CM | POA: Diagnosis not present

## 2018-09-16 DIAGNOSIS — Z79899 Other long term (current) drug therapy: Secondary | ICD-10-CM | POA: Insufficient documentation

## 2018-09-16 DIAGNOSIS — M6282 Rhabdomyolysis: Secondary | ICD-10-CM | POA: Diagnosis not present

## 2018-09-16 DIAGNOSIS — F1010 Alcohol abuse, uncomplicated: Secondary | ICD-10-CM

## 2018-09-16 LAB — BASIC METABOLIC PANEL
Anion gap: 10 (ref 5–15)
BUN: 11 mg/dL (ref 6–20)
CO2: 24 mmol/L (ref 22–32)
Calcium: 9.1 mg/dL (ref 8.9–10.3)
Chloride: 104 mmol/L (ref 98–111)
Creatinine, Ser: 0.77 mg/dL (ref 0.61–1.24)
GFR calc Af Amer: >60 mL/min (ref >60)
GFR calc non Af Amer: >60 mL/min (ref >60)
Glucose, Bld: 101 mg/dL — ABNORMAL HIGH (ref 70–99)
Potassium: 4.1 mmol/L (ref 3.5–5.1)
Sodium: 138 mmol/L (ref 135–145)

## 2018-09-16 LAB — ETHANOL: Alcohol, Ethyl (B): <10 mg/dL (ref <10)

## 2018-09-16 MED ORDER — CHLORDIAZEPOXIDE HCL 25 MG PO CAPS
25.0000 mg | ORAL_CAPSULE | Freq: Three times a day (TID) | ORAL | Status: DC
  Administered 2018-09-16: 25 mg via ORAL
  Filled 2018-09-16: qty 1

## 2018-09-16 MED ORDER — FOLIC ACID 1 MG PO TABS
1.0000 mg | ORAL_TABLET | Freq: Every day | ORAL | Status: DC
  Administered 2018-09-16: 1 mg via ORAL
  Filled 2018-09-16: qty 1

## 2018-09-16 MED ORDER — ADULT MULTIVITAMIN W/MINERALS CH
1.0000 | ORAL_TABLET | Freq: Every day | ORAL | Status: DC
  Administered 2018-09-16: 1 via ORAL
  Filled 2018-09-16: qty 1

## 2018-09-16 MED ORDER — THIAMINE HCL 100 MG/ML IJ SOLN: 100.0000 mg | Freq: Every day | INTRAMUSCULAR | Status: DC

## 2018-09-16 MED ORDER — LORAZEPAM 2 MG/ML IJ SOLN: 1.0000 mg | Freq: Four times a day (QID) | INTRAMUSCULAR | Status: DC | PRN

## 2018-09-16 MED ORDER — VITAMIN B-1 100 MG PO TABS
100.0000 mg | ORAL_TABLET | Freq: Every day | ORAL | Status: DC
  Administered 2018-09-16: 100 mg via ORAL
  Filled 2018-09-16: qty 1

## 2018-09-16 MED ORDER — LORAZEPAM 1 MG PO TABS: 1.0000 mg | ORAL_TABLET | Freq: Four times a day (QID) | ORAL | Status: DC | PRN

## 2018-09-16 MED ORDER — LORAZEPAM 2 MG PO TABS
3.0000 mg | ORAL_TABLET | Freq: Once | ORAL | Status: AC
  Administered 2018-09-16: 3 mg via ORAL
  Filled 2018-09-16: qty 1

## 2018-09-16 NOTE — ED Notes (Signed)
Assisted EDT Tracey with applying a condom catheter.

## 2018-09-16 NOTE — ED Notes (Signed)
tolerated lunch well patient ate 100% of meal.

## 2018-09-16 NOTE — BH Assessment (Signed)
Pt has been faxed to the following facilities for detox treatment:   [facility]   [facility] - No male beds available   [facility]       [facility]     [facility]     [facility]     [facility]     [facility]     [facility]     [facility]     [facility]     [facility]     [facility]     [facility]     [facility]     [facility]     [facility]     [facility]     [facility]

## 2018-09-16 NOTE — ED Provider Notes (Signed)
[facility] Emergency Department Provider Note  ____________________________________________   First MD Initiated Contact with Patient 09/16/18 [phone]     (approximate)  I have reviewed the triage vital signs and the nursing notes.   HISTORY  Chief Complaint Drug / Alcohol Assessment    HPI Kurt Horton is a 58 y.o. male here for evaluation of  desiring medication for withdrawal.  Patient reports he left the hospital, gone home he has been sober for 2 straight days now.  He has been tremulous and has had strong urge to go down to the liquor store and buy liquor but has not done so.  He called EMS in hopes he be able to go directly to rehab today, but despite attempted arrangement he was not able to be enrolled in rehab from his home.  He comes here seeking relief from alcohol withdrawal.  He denies fevers or chills.  No nausea or vomiting.  No chest pain.  He reports he just having the shakes and needs treatment.  Past Medical History:  Diagnosis Date  . A-fib (HCC)   . Alcohol abuse   . Alcohol withdrawal (HCC) 11/12/2016  . Diabetes mellitus without complication (HCC)   . DJD (degenerative joint disease)   . Hypertension   . Myasthenia gravis (HCC)   . Renal disorder     Patient Active Problem List   Diagnosis Date Noted  . Alcohol withdrawal (HCC) 08/26/2018  . A-fib (HCC) 08/17/2018  . Tobacco use disorder 05/27/2018  . GI bleed 02/04/2018  . Alcohol intoxication (HCC) 12/22/2017  . Malnutrition of moderate degree 08-23-202019  . Chronic combined systolic and diastolic CHF (congestive heart failure) (HCC) 08/13/2017  . Alcohol withdrawal delirium (HCC) 02/26/2017  . Atrial fibrillation with RVR (HCC) 02/25/2017  . Acute alcoholic intoxication with complication (HCC)   . Alcohol use disorder, severe, dependence (HCC)   . Substance induced mood disorder (HCC) 11/13/2016  . SVT (supraventricular tachycardia) (HCC) 11/12/2016  . HTN  (hypertension) 11/12/2016  . EtOH dependence (HCC) 11/12/2016  . Lumbar radiculopathy 08/24/2016  . Chronic low back pain 01/04/2016  . Pressure ulcer 12/29/2015  . Osteomyelitis (HCC) 12/26/2015  . PVD (peripheral vascular disease) (HCC) 12/26/2015  . Type II diabetes mellitus with manifestations (HCC) 12/26/2015  . Myasthenia gravis (HCC) 12/26/2015    Past Surgical History:  Procedure Laterality Date  . AMPUTATION TOE Left 12/29/2015   Procedure: AMPUTATION TOE;  Surgeon: Gwyneth Revels, DPM;  Location: [facility] ORS;  Service: Podiatry;  Laterality: Left;  . ESOPHAGOGASTRODUODENOSCOPY N/A 02/05/2018   Procedure: ESOPHAGOGASTRODUODENOSCOPY (EGD);  Surgeon: Pasty Spillers, MD;  Location: [facility] ENDOSCOPY;  Service: Endoscopy;  Laterality: N/A;  . PERIPHERAL VASCULAR CATHETERIZATION N/A 12/27/2015   Procedure: Lower Extremity Angiography;  Surgeon: Annice Needy, MD;  Location: [facility] INVASIVE CV LAB;  Service: Cardiovascular;  Laterality: N/A;  . right hip surgery    . right shoulder surgery      Prior to Admission medications   Medication Sig Start Date End Date Taking? Authorizing Provider  cloNIDine (CATAPRES - DOSED IN MG/24 HR) 0.1 mg/24hr patch Place 1 patch onto the skin once a week. 05/30/18   [provider]  diazepam (VALIUM) 2 MG tablet Take 1 tablet (2 mg total) by mouth 3 (three) times daily before meals for 5 days. 09/15/18 09/20/18  Adrian Saran, MD  diltiazem (CARDIZEM CD) 120 MG 24 hr capsule Take 1 capsule (120 mg total) by mouth daily. 06/29/18   Altamese Dilling,  MD  folic acid (FOLVITE) 1 MG tablet Take 1 tablet (1 mg total) by mouth daily for 30 days. 08/29/18 09/28/18  Ihor AustinPyreddy, Pavan, MD  guaiFENesin-dextromethorphan (ROBITUSSIN DM) 100-10 MG/5ML syrup Take 5 mLs by mouth every 4 (four) hours as needed for cough. 09/07/18   Shaune Pollackhen, Qing, MD  hydrOXYzine (ATARAX/VISTARIL) 25 MG tablet Take 1 tablet (25 mg total) by mouth 3 (three) times daily as needed for anxiety.  09/07/18   Shaune Pollackhen, Qing, MD  metFORMIN (GLUCOPHAGE) 500 MG tablet Take 1 tablet (500 mg total) by mouth daily. 05/30/18   Pucilowska, Braulio ConteJolanta B, MD  metoprolol succinate (TOPROL-XL) 50 MG 24 hr tablet Take 1 tablet (50 mg total) by mouth daily. Take with or immediately following a meal. 05/30/18   Pucilowska, Jolanta B, MD  Multiple Vitamin (MULTIVITAMIN WITH MINERALS) TABS tablet Take 1 tablet by mouth daily. 09/08/18   Shaune Pollackhen, Qing, MD  nicotine (NICODERM CQ - DOSED IN MG/24 HOURS) 14 mg/24hr patch Place 1 patch (14 mg total) onto the skin daily. 08/29/18   Ihor AustinPyreddy, Pavan, MD  Oxycodone HCl 10 MG TABS Take 10 mg by mouth daily. 09/04/18 09/19/18  [provider]  senna-docusate (SENOKOT-S) 8.6-50 MG tablet Take 1 tablet by mouth at bedtime as needed for mild constipation. 12/25/17   Mayo, Allyn KennerKaty Dodd, MD  testosterone cypionate (DEPOTESTOSTERONE CYPIONATE) 200 MG/ML injection Inject 200 mg into the muscle every 14 (fourteen) days.    [provider]  thiamine 100 MG tablet Take 1 tablet (100 mg total) by mouth daily for 30 days. 08/29/18 09/28/18  Ihor AustinPyreddy, Pavan, MD  traZODone (DESYREL) 100 MG tablet Take 1 tablet (100 mg total) by mouth at bedtime. 05/30/18   Pucilowska, Ellin GoodieJolanta B, MD    Allergies Nsaids and Other  Family History  Problem Relation Age of Onset  . Rheum arthritis Father     Social History Social History   Tobacco Use  . Smoking status: Current Every Day Smoker    Packs/day: 0.50    Types: Cigarettes  . Smokeless tobacco: Never Used  Substance Use Topics  . Alcohol use: Yes    Alcohol/week: 20.0 - 30.0 standard drinks    Types: 20 - 30 Shots of liquor per week    Comment: Last drink 3 days ago 06/22/2018- rum  . Drug use: Yes    Types: Oxycodone    Review of Systems Constitutional: No fever/chills Eyes: No visual changes. ENT: No sore throat. Cardiovascular: Denies chest pain. Respiratory: Denies shortness of breath. Gastrointestinal: No abdominal pain.   Little bit of nausea. Genitourinary: Negative for dysuria. Musculoskeletal: Negative for back pain.  Shakiness in both hands he describes it as "withdrawals" Skin: Negative for rash.  Is a little bit sweaty with the shakes. Neurological: Negative for headaches, areas of focal weakness or numbness.    ____________________________________________   PHYSICAL EXAM:  VITAL SIGNS: ED Triage Vitals  Enc Vitals Group     BP 09/16/18 0956 128/89     Pulse Rate 09/16/18 0956 85     Resp 09/16/18 0956 18     Temp 09/16/18 0956 98.1 F (36.7 C)     Temp Source 09/16/18 0956 Oral     SpO2 09/16/18 0956 98 %     Weight 09/16/18 0957 185 lb 3 oz (84 kg)     Height 09/16/18 0957 5\' 11"  (1.803 m)     Head Circumference --      Peak Flow --      Pain Score  09/16/18 0957 8     Pain Loc --      Pain Edu? --      Excl. in GC? --     Constitutional: Alert and oriented.  He appears much better than in the past actually now well-dressed groomed. Eyes: Conjunctivae are normal. Head: Atraumatic. Nose: No congestion/rhinnorhea. Mouth/Throat: Mucous membranes are moist. Neck: No stridor.  Cardiovascular: Normal rate, regular rhythm. Grossly normal heart sounds.  Good peripheral circulation. Respiratory: Normal respiratory effort.  No retractions. Lungs CTAB. Gastrointestinal: Soft and nontender. No distention. Musculoskeletal: No lower extremity tenderness nor edema.  Moderate tremor upper extremities bilateral. Neurologic:  Normal speech and language. No gross focal neurologic deficits are appreciated.  Skin:  Skin is warm, dry and intact. No rash noted. Psychiatric: Mood and affect are normal. Speech and behavior are normal.  ____________________________________________   LABS (all labs ordered are listed, but only abnormal results are displayed)  Labs Reviewed  BASIC METABOLIC PANEL - Abnormal; Notable for the following components:      Result Value   Glucose, Bld 101 (*)    All other  components within normal limits  ETHANOL  CBG MONITORING, ED   ____________________________________________  EKG   ____________________________________________  RADIOLOGY   ____________________________________________   PROCEDURES  Procedure(s) performed: None  Procedures  Critical Care performed: No  ____________________________________________   INITIAL IMPRESSION / ASSESSMENT AND PLAN / ED COURSE  Pertinent labs & imaging results that were available during my care of the patient were reviewed by me and considered in my medical decision making (see chart for details).   Patient re-presenting for request of detox.  Well-known to our hospital.  He does have evidence of mild withdrawal at this time but no acute delirium tremens or severe symptoms.  Was given a prescription for diazepam yesterday, but evidently has not used it and reports that he is not receiving rehab will go right back to drinking.  No homicidal or suicidal actions.  No reason for involuntary commitment.  Denies acute interim medical illness since his discharge of the withdrawal.  Clinical Course as of Sep 15 2244  Mon Sep 16, 2018  1420 Remains restful, alert, no distress.  Provided outpatient detox options by Dr. Rebecca Eaton (psych) and also seen by TTS.  Exhibits no signs of complicated withdrawal at this time and has normal vital signs   [MQ]    Clinical Course User Index [MQ] Sharyn Creamer, MD   Ongoing care signed Dr. Roxan Hockey at 3:20 PM, plans to follow-up on final recommendations from our TTS service regarding placement for alcoholism.  Patient is medically stable, currently under withdrawal protocol.  Patient is resting comfortably, fully awake and alert without distress and normal hemodynamics.  ____________________________________________   FINAL CLINICAL IMPRESSION(S) / ED DIAGNOSES  Final diagnoses:  Alcohol abuse  Alcohol dependence with uncomplicated withdrawal (HCC)        Note:  This  document was prepared using Dragon voice recognition software and may include unintentional dictation errors    Sharyn Creamer, MD 09/16/18 2246

## 2018-09-16 NOTE — ED Notes (Signed)
Spoke with father on contact list about dunking needing a roide home, he has been discharged to home. Father unable to pick due to not feeling well, request that we give a voucher home. Charge nurse aware. vouchher provided. Taxi called, patient waiting for cab in [place].

## 2018-09-16 NOTE — ED Triage Notes (Signed)
Patient from home via [facility]. Patient was discharged from hospital yesterday. States he has not had anything to drink since he was admitted on Saturday. Patient states he called EMS in an effort to get somewhere for help before he had something to drink. Patient complaining of tremors. Denies headache or nausea. Patient alert and oriented. Patient stating "please don't discharge me home. I want to stop drinking and I know if I go home, I'll start again."

## 2018-09-16 NOTE — ED Notes (Signed)
Patient reports unable to leave ed , will go home and drink again. Vss. Reports he feels to weak to leave. Charge nurse aware, Md aware.

## 2018-09-16 NOTE — ED Notes (Signed)
Assumed care of patient. Denies withdrawls at present time. meds ordered as scheduled, obtained and administered. Vss. safety maintained. Will continue tpo monitor.

## 2018-09-16 NOTE — ED Notes (Signed)
Rounding  Done patient requesting something to eat and drink. Lunch box provided. Vss. safety maintained. Will continue to monitor.

## 2018-09-16 NOTE — ED Provider Notes (Addendum)
Patient with core poor compliance well-known to this facility.  No signs of active withdrawal.  Has been evaluated by TTS and given outpatient referral.  There are no inpatient beds available.  Patient stable for outpatient follow-up.  He has prescription for diazepam that was filled yesterday. Willy Eddy, MD 09/16/18 [place] Guardian    Willy Eddy, MD 09/16/18 [phone]

## 2018-09-18 ENCOUNTER — Other Ambulatory Visit: Payer: Self-pay | Admitting: Internal Medicine

## 2018-09-18 ENCOUNTER — Other Ambulatory Visit: Payer: Self-pay

## 2018-09-18 ENCOUNTER — Inpatient Hospital Stay
Admission: EM | Admit: 2018-09-18 | Discharge: 2018-09-20 | DRG: 558 | Disposition: A | Discharge status: Home / Self Care | Payer: Medicare Other | Attending: Internal Medicine | Admitting: Internal Medicine

## 2018-09-18 ENCOUNTER — Encounter: Payer: Self-pay | Admitting: Emergency Medicine

## 2018-09-18 DIAGNOSIS — I10 Essential (primary) hypertension: Secondary | ICD-10-CM | POA: Diagnosis present

## 2018-09-18 DIAGNOSIS — Z9181 History of falling: Secondary | ICD-10-CM | POA: Diagnosis not present

## 2018-09-18 DIAGNOSIS — Z89422 Acquired absence of other left toe(s): Secondary | ICD-10-CM

## 2018-09-18 DIAGNOSIS — Z20828 Contact with and (suspected) exposure to other viral communicable diseases: Secondary | ICD-10-CM | POA: Diagnosis present

## 2018-09-18 DIAGNOSIS — E1140 Type 2 diabetes mellitus with diabetic neuropathy, unspecified: Secondary | ICD-10-CM | POA: Diagnosis present

## 2018-09-18 DIAGNOSIS — I480 Paroxysmal atrial fibrillation: Secondary | ICD-10-CM | POA: Diagnosis present

## 2018-09-18 DIAGNOSIS — F1020 Alcohol dependence, uncomplicated: Secondary | ICD-10-CM | POA: Diagnosis present

## 2018-09-18 DIAGNOSIS — Z79899 Other long term (current) drug therapy: Secondary | ICD-10-CM

## 2018-09-18 DIAGNOSIS — N179 Acute kidney failure, unspecified: Secondary | ICD-10-CM | POA: Diagnosis present

## 2018-09-18 DIAGNOSIS — Z8261 Family history of arthritis: Secondary | ICD-10-CM | POA: Diagnosis not present

## 2018-09-18 DIAGNOSIS — R531 Weakness: Secondary | ICD-10-CM

## 2018-09-18 DIAGNOSIS — M5416 Radiculopathy, lumbar region: Secondary | ICD-10-CM | POA: Diagnosis present

## 2018-09-18 DIAGNOSIS — M6282 Rhabdomyolysis: Secondary | ICD-10-CM | POA: Diagnosis present

## 2018-09-18 DIAGNOSIS — M4727 Other spondylosis with radiculopathy, lumbosacral region: Secondary | ICD-10-CM

## 2018-09-18 DIAGNOSIS — Z881 Allergy status to other antibiotic agents status: Secondary | ICD-10-CM | POA: Diagnosis not present

## 2018-09-18 DIAGNOSIS — Z886 Allergy status to analgesic agent status: Secondary | ICD-10-CM | POA: Diagnosis not present

## 2018-09-18 DIAGNOSIS — T796XXA Traumatic ischemia of muscle, initial encounter: Secondary | ICD-10-CM

## 2018-09-18 DIAGNOSIS — G7000 Myasthenia gravis without (acute) exacerbation: Secondary | ICD-10-CM | POA: Diagnosis present

## 2018-09-18 DIAGNOSIS — I110 Hypertensive heart disease with heart failure: Secondary | ICD-10-CM | POA: Diagnosis present

## 2018-09-18 DIAGNOSIS — F17210 Nicotine dependence, cigarettes, uncomplicated: Secondary | ICD-10-CM | POA: Diagnosis present

## 2018-09-18 DIAGNOSIS — R296 Repeated falls: Secondary | ICD-10-CM | POA: Diagnosis present

## 2018-09-18 DIAGNOSIS — E118 Type 2 diabetes mellitus with unspecified complications: Secondary | ICD-10-CM | POA: Diagnosis present

## 2018-09-18 DIAGNOSIS — E1151 Type 2 diabetes mellitus with diabetic peripheral angiopathy without gangrene: Secondary | ICD-10-CM | POA: Diagnosis present

## 2018-09-18 DIAGNOSIS — M4807 Spinal stenosis, lumbosacral region: Secondary | ICD-10-CM

## 2018-09-18 DIAGNOSIS — I5042 Chronic combined systolic (congestive) and diastolic (congestive) heart failure: Secondary | ICD-10-CM | POA: Diagnosis present

## 2018-09-18 DIAGNOSIS — Z7984 Long term (current) use of oral hypoglycemic drugs: Secondary | ICD-10-CM

## 2018-09-18 DIAGNOSIS — R7989 Other specified abnormal findings of blood chemistry: Secondary | ICD-10-CM | POA: Diagnosis present

## 2018-09-18 LAB — URINALYSIS, COMPLETE (UACMP) WITH MICROSCOPIC
Bacteria, UA: NONE SEEN
Bilirubin Urine: NEGATIVE
Glucose, UA: NEGATIVE mg/dL
Hgb urine dipstick: NEGATIVE
Ketones, ur: 5 mg/dL — AB
Leukocytes,Ua: NEGATIVE
Nitrite: NEGATIVE
Protein, ur: 30 mg/dL — AB
Specific Gravity, Urine: 1.025 (ref 1.005–1.030)
pH: 5 (ref 5.0–8.0)

## 2018-09-18 LAB — CBC
HCT: 32.9 % — ABNORMAL LOW (ref 39.0–52.0)
Hemoglobin: 10.5 g/dL — ABNORMAL LOW (ref 13.0–17.0)
MCH: 29.7 pg (ref 26.0–34.0)
MCHC: 31.9 g/dL (ref 30.0–36.0)
MCV: 92.9 fL (ref 80.0–100.0)
Platelets: 248 10*3/uL (ref 150–400)
RBC: 3.54 MIL/uL — ABNORMAL LOW (ref 4.22–5.81)
RDW: 19.3 % — ABNORMAL HIGH (ref 11.5–15.5)
WBC: 10.5 10*3/uL (ref 4.0–10.5)
nRBC: 0 % (ref 0.0–0.2)

## 2018-09-18 LAB — SEDIMENTATION RATE: Sed Rate: 24 mm/hr — ABNORMAL HIGH (ref 0–20)

## 2018-09-18 LAB — CREATININE, SERUM
Creatinine, Ser: 1.42 mg/dL — ABNORMAL HIGH (ref 0.61–1.24)
GFR calc Af Amer: >60 mL/min (ref >60)
GFR calc non Af Amer: 54 mL/min — ABNORMAL LOW (ref >60)

## 2018-09-18 LAB — GLUCOSE, CAPILLARY: Glucose-Capillary: 102 mg/dL — ABNORMAL HIGH (ref 70–99)

## 2018-09-18 LAB — TROPONIN I: Troponin I: <0.03 ng/mL (ref <0.03)

## 2018-09-18 MED ORDER — ACETAMINOPHEN 650 MG RE SUPP: 650.0000 mg | Freq: Four times a day (QID) | RECTAL | Status: DC | PRN

## 2018-09-18 MED ORDER — VITAMIN B-1 100 MG PO TABS
100.0000 mg | ORAL_TABLET | Freq: Every day | ORAL | Status: DC
  Administered 2018-09-19 – 2018-09-20 (×2): 100 mg via ORAL
  Filled 2018-09-18 (×2): qty 1

## 2018-09-18 MED ORDER — FOLIC ACID 5 MG/ML IJ SOLN
1.0000 mg | Freq: Every day | INTRAMUSCULAR | Status: DC
  Administered 2018-09-18 – 2018-09-20 (×3): 1 mg via INTRAVENOUS
  Filled 2018-09-18 (×3): qty 0.2

## 2018-09-18 MED ORDER — LORAZEPAM 2 MG/ML IJ SOLN: 0.0000 mg | Freq: Two times a day (BID) | INTRAMUSCULAR | Status: DC

## 2018-09-18 MED ORDER — TRAZODONE HCL 50 MG PO TABS
100.0000 mg | ORAL_TABLET | Freq: Every day | ORAL | Status: DC
  Administered 2018-09-18 – 2018-09-19 (×2): 100 mg via ORAL
  Filled 2018-09-18 (×2): qty 2

## 2018-09-18 MED ORDER — ADULT MULTIVITAMIN W/MINERALS CH
1.0000 | ORAL_TABLET | Freq: Every day | ORAL | Status: DC
  Administered 2018-09-19 – 2018-09-20 (×2): 1 via ORAL
  Filled 2018-09-18 (×2): qty 1

## 2018-09-18 MED ORDER — OXYCODONE HCL 5 MG PO TABS
5.0000 mg | ORAL_TABLET | ORAL | Status: DC | PRN
  Administered 2018-09-18 – 2018-09-19 (×5): 5 mg via ORAL
  Filled 2018-09-18 (×5): qty 1

## 2018-09-18 MED ORDER — INSULIN ASPART 100 UNIT/ML ~~LOC~~ SOLN: 0.0000 [IU] | Freq: Every day | SUBCUTANEOUS | Status: DC

## 2018-09-18 MED ORDER — INSULIN ASPART 100 UNIT/ML ~~LOC~~ SOLN
0.0000 [IU] | Freq: Three times a day (TID) | SUBCUTANEOUS | Status: DC
  Administered 2018-09-19 – 2018-09-20 (×3): 1 [IU] via SUBCUTANEOUS
  Filled 2018-09-18 (×3): qty 1

## 2018-09-18 MED ORDER — LORAZEPAM 2 MG/ML IJ SOLN: 1.0000 mg | Freq: Four times a day (QID) | INTRAMUSCULAR | Status: DC | PRN

## 2018-09-18 MED ORDER — THIAMINE HCL 100 MG/ML IJ SOLN
100.0000 mg | Freq: Once | INTRAMUSCULAR | Status: AC
  Administered 2018-09-18: 100 mg via INTRAVENOUS
  Filled 2018-09-18: qty 2

## 2018-09-18 MED ORDER — SODIUM CHLORIDE 0.9 % IV BOLUS: 1000.0000 mL | Freq: Once | INTRAVENOUS | Status: DC

## 2018-09-18 MED ORDER — ONDANSETRON HCL 4 MG/2ML IJ SOLN: 4.0000 mg | Freq: Four times a day (QID) | INTRAMUSCULAR | Status: DC | PRN

## 2018-09-18 MED ORDER — ACETAMINOPHEN 325 MG PO TABS: 650.0000 mg | ORAL_TABLET | Freq: Four times a day (QID) | ORAL | Status: DC | PRN

## 2018-09-18 MED ORDER — SENNOSIDES-DOCUSATE SODIUM 8.6-50 MG PO TABS: 1.0000 | ORAL_TABLET | Freq: Every evening | ORAL | Status: DC | PRN

## 2018-09-18 MED ORDER — ENOXAPARIN SODIUM 40 MG/0.4ML ~~LOC~~ SOLN
40.0000 mg | SUBCUTANEOUS | Status: DC
  Administered 2018-09-18 – 2018-09-19 (×2): 40 mg via SUBCUTANEOUS
  Filled 2018-09-18 (×2): qty 0.4

## 2018-09-18 MED ORDER — MAGNESIUM SULFATE 2 GM/50ML IV SOLN
2.0000 g | Freq: Once | INTRAVENOUS | Status: AC
  Administered 2018-09-18: 22:00:00 2 g via INTRAVENOUS
  Filled 2018-09-18: qty 50

## 2018-09-18 MED ORDER — SODIUM CHLORIDE 0.9 % IV SOLN
INTRAVENOUS | Status: DC
  Administered 2018-09-18 – 2018-09-20 (×4): via INTRAVENOUS

## 2018-09-18 MED ORDER — ONDANSETRON HCL 4 MG PO TABS: 4.0000 mg | ORAL_TABLET | Freq: Four times a day (QID) | ORAL | Status: DC | PRN

## 2018-09-18 MED ORDER — NICOTINE 14 MG/24HR TD PT24
14.0000 mg | MEDICATED_PATCH | Freq: Every day | TRANSDERMAL | Status: DC
  Administered 2018-09-19 – 2018-09-20 (×2): 14 mg via TRANSDERMAL
  Filled 2018-09-18 (×2): qty 1

## 2018-09-18 MED ORDER — THIAMINE HCL 100 MG/ML IJ SOLN: 100.0000 mg | Freq: Every day | INTRAMUSCULAR | Status: DC

## 2018-09-18 MED ORDER — LORAZEPAM 2 MG/ML IJ SOLN: 0.0000 mg | Freq: Four times a day (QID) | INTRAMUSCULAR | Status: DC

## 2018-09-18 MED ORDER — SODIUM CHLORIDE 0.9 % IV BOLUS
1000.0000 mL | Freq: Once | INTRAVENOUS | Status: AC
  Administered 2018-09-18: 20:00:00 1000 mL via INTRAVENOUS

## 2018-09-18 MED ORDER — MAGNESIUM SULFATE 2 GM/50ML IV SOLN
2.0000 g | Freq: Once | INTRAVENOUS | Status: AC
  Administered 2018-09-18: 20:00:00 2 g via INTRAVENOUS
  Filled 2018-09-18: qty 50

## 2018-09-18 MED ORDER — LORAZEPAM 1 MG PO TABS
1.0000 mg | ORAL_TABLET | Freq: Four times a day (QID) | ORAL | Status: DC | PRN
  Administered 2018-09-19: 20:00:00 1 mg via ORAL
  Filled 2018-09-18: qty 1

## 2018-09-18 MED ORDER — VITAMIN B-1 100 MG PO TABS: 100.0000 mg | ORAL_TABLET | Freq: Every day | ORAL | Status: DC

## 2018-09-18 NOTE — ED Triage Notes (Signed)
Pt to triage via w/c with no distress noted, reports was told by PCP to come to ED for further evaluation due to abnormal labs; pt reports leg weakness with frequent falls

## 2018-09-18 NOTE — ED Provider Notes (Signed)
[facility] Emergency Department Provider Note  ____________________________________________   First MD Initiated Contact with Patient 09/18/18 1921     (approximate)  I have reviewed the triage vital signs and the nursing notes.   HISTORY  Chief Complaint Weakness   HPI Kurt Horton is a 58 y.o. male with a longstanding history of alcoholism who presents to the emergency department for treatment and evaluation of lower extremity weakness.  He was evaluated at his primary care provider's office today and had labs drawn.  He was contacted this afternoon and advised to come to the emergency department because his "muscles are breaking down."  Patient states that he falls frequently and feels as though his legs cannot support his weight.  No falls today.  He denies alcohol intake today.     Past Medical History:  Diagnosis Date  . A-fib (HCC)   . Alcohol abuse   . Alcohol withdrawal (HCC) 11/12/2016  . Diabetes mellitus without complication (HCC)   . DJD (degenerative joint disease)   . Hypertension   . Myasthenia gravis (HCC)   . Renal disorder     Patient Active Problem List   Diagnosis Date Noted  . Rhabdomyolysis 09/18/2018  . Alcohol withdrawal (HCC) 08/26/2018  . PAF (paroxysmal atrial fibrillation) (HCC) 08/17/2018  . Tobacco use disorder 05/27/2018  . GI bleed 02/04/2018  . Alcohol intoxication (HCC) 12/22/2017  . Malnutrition of moderate degree 10/04/2017  . Chronic combined systolic and diastolic CHF (congestive heart failure) (HCC) 08/13/2017  . Alcohol withdrawal delirium (HCC) 02/26/2017  . Atrial fibrillation with RVR (HCC) 02/25/2017  . Acute alcoholic intoxication with complication (HCC)   . Alcohol use disorder, severe, dependence (HCC)   . Substance induced mood disorder (HCC) 11/13/2016  . SVT (supraventricular tachycardia) (HCC) 11/12/2016  . HTN (hypertension) 11/12/2016  . EtOH dependence (HCC) 11/12/2016  . Lumbar  radiculopathy 08/24/2016  . Chronic low back pain 01/04/2016  . Pressure ulcer 12/29/2015  . Osteomyelitis (HCC) 12/26/2015  . PVD (peripheral vascular disease) (HCC) 12/26/2015  . Type II diabetes mellitus with manifestations (HCC) 12/26/2015  . Myasthenia gravis (HCC) 12/26/2015    Past Surgical History:  Procedure Laterality Date  . AMPUTATION TOE Left 12/29/2015   Procedure: AMPUTATION TOE;  Surgeon: Gwyneth Revels, DPM;  Location: [facility] ORS;  Service: Podiatry;  Laterality: Left;  . ESOPHAGOGASTRODUODENOSCOPY N/A 02/05/2018   Procedure: ESOPHAGOGASTRODUODENOSCOPY (EGD);  Surgeon: Pasty Spillers, MD;  Location: [facility] ENDOSCOPY;  Service: Endoscopy;  Laterality: N/A;  . PERIPHERAL VASCULAR CATHETERIZATION N/A 12/27/2015   Procedure: Lower Extremity Angiography;  Surgeon: Annice Needy, MD;  Location: [facility] INVASIVE CV LAB;  Service: Cardiovascular;  Laterality: N/A;  . right hip surgery    . right shoulder surgery      Prior to Admission medications   Medication Sig Start Date End Date Taking? Authorizing Provider  cloNIDine (CATAPRES - DOSED IN MG/24 HR) 0.1 mg/24hr patch Place 1 patch onto the skin once a week. 05/30/18  Yes [provider]  diazepam (VALIUM) 2 MG tablet Take 1 tablet (2 mg total) by mouth 3 (three) times daily before meals for 5 days. 09/15/18 09/20/18 Yes Mody, Patricia Pesa, MD  diltiazem (CARDIZEM CD) 120 MG 24 hr capsule Take 1 capsule (120 mg total) by mouth daily. 06/29/18  Yes Altamese Dilling, MD  folic acid (FOLVITE) 1 MG tablet Take 1 tablet (1 mg total) by mouth daily for 30 days. 08/29/18 09/28/18 Yes Pyreddy, Vivien Rota, MD  hydrOXYzine (ATARAX/VISTARIL) 25  MG tablet Take 1 tablet (25 mg total) by mouth 3 (three) times daily as needed for anxiety. 09/07/18  Yes Shaune Pollack, MD  ibuprofen (ADVIL) 800 MG tablet Take 800 mg by mouth 2 (two) times daily as needed for pain. 09/18/18 09/28/18 Yes [provider]  magnesium oxide (MAG-OX) 400 MG tablet Take 400  mg by mouth daily. 09/18/18 10/18/18 Yes [provider]  metFORMIN (GLUCOPHAGE) 500 MG tablet Take 1 tablet (500 mg total) by mouth daily. 05/30/18  Yes Pucilowska, Jolanta B, MD  metoprolol succinate (TOPROL-XL) 50 MG 24 hr tablet Take 1 tablet (50 mg total) by mouth daily. Take with or immediately following a meal. 05/30/18  Yes Pucilowska, Jolanta B, MD  Multiple Vitamin (MULTIVITAMIN WITH MINERALS) TABS tablet Take 1 tablet by mouth daily. 09/08/18  Yes Shaune Pollack, MD  nicotine (NICODERM CQ - DOSED IN MG/24 HOURS) 14 mg/24hr patch Place 1 patch (14 mg total) onto the skin daily. 08/29/18  Yes Pyreddy, Vivien Rota, MD  Oxycodone HCl 10 MG TABS Take 10 mg by mouth daily as needed for pain. 09/17/18 10/02/18 Yes [provider]  senna-docusate (SENOKOT-S) 8.6-50 MG tablet Take 1 tablet by mouth at bedtime as needed for mild constipation. 12/25/17  Yes Mayo, Allyn Kenner, MD  thiamine 100 MG tablet Take 1 tablet (100 mg total) by mouth daily for 30 days. 08/29/18 09/28/18 Yes Pyreddy, Vivien Rota, MD  traZODone (DESYREL) 100 MG tablet Take 1 tablet (100 mg total) by mouth at bedtime. 05/30/18  Yes Pucilowska, Jolanta B, MD  guaiFENesin-dextromethorphan (ROBITUSSIN DM) 100-10 MG/5ML syrup Take 5 mLs by mouth every 4 (four) hours as needed for cough. Patient not taking: Reported on 09/18/2018 09/07/18   Shaune Pollack, MD  testosterone cypionate (DEPOTESTOSTERONE CYPIONATE) 200 MG/ML injection Inject 200 mg into the muscle every 14 (fourteen) days.    [provider]    Allergies Nsaids and Other  Family History  Problem Relation Age of Onset  . Rheum arthritis Father     Social History Social History   Tobacco Use  . Smoking status: Current Every Day Smoker    Packs/day: 0.50    Types: Cigarettes  . Smokeless tobacco: Never Used  Substance Use Topics  . Alcohol use: Yes    Alcohol/week: 20.0 - 30.0 standard drinks    Types: 20 - 30 Shots of liquor per week    Comment: Last drink 3 days ago  06/22/2018- rum  . Drug use: Yes    Types: Oxycodone    Review of Systems  Constitutional: No fever/chills Eyes: No visual changes. ENT: No sore throat. Cardiovascular: Denies chest pain. Respiratory: Denies shortness of breath. Gastrointestinal: No abdominal pain.  No nausea, no vomiting.  No diarrhea.  No constipation. Genitourinary: Negative for dysuria. Positive for dark urine. Musculoskeletal: Positive for chronic back pain. Skin: Negative for rash. Neurological: Negative for headaches, focal weakness or numbness. ____________________________________________   PHYSICAL EXAM:  VITAL SIGNS: ED Triage Vitals  Enc Vitals Group     BP 09/18/18 1905 132/73     Pulse Rate 09/18/18 1905 (!) 111     Resp 09/18/18 1905 20     Temp 09/18/18 1905 (!) 97.5 F (36.4 C)     Temp Source 09/18/18 1905 Oral     SpO2 09/18/18 1905 100 %     Weight 09/18/18 1906 185 lb (83.9 kg)     Height 09/18/18 1906 5\' 11"  (1.803 m)     Head Circumference --  Peak Flow --      Pain Score 09/18/18 1906 8     Pain Loc --      Pain Edu? --      Excl. in GC? --     Constitutional: Alert and oriented. Well appearing and in no acute distress. Eyes: Conjunctivae are normal. PERRL. EOMI. Head: Atraumatic. Nose: No congestion/rhinnorhea. Mouth/Throat: Mucous membranes are moist.  Oropharynx non-erythematous. Neck: No stridor.   Cardiovascular: Normal rate, regular rhythm. Grossly normal heart sounds.  Good peripheral circulation. Respiratory: Normal respiratory effort.  No retractions. Lungs CTAB. Gastrointestinal: Soft and nontender. No distention. No abdominal bruits. No CVA tenderness. Musculoskeletal: No lower extremity tenderness nor edema.  No joint effusions. Neurologic:  Normal speech and language. No gross focal neurologic deficits are appreciated. No gait instability. Skin:  Skin is warm, dry and intact. No rash noted. Psychiatric: Mood and affect are normal. Speech and behavior are  normal.  ____________________________________________   LABS (all labs ordered are listed, but only abnormal results are displayed)  Labs Reviewed  URINALYSIS, COMPLETE (UACMP) WITH MICROSCOPIC   ____________________________________________  EKG  ED ECG REPORT I, Tasha Diaz, FNP-BC personally viewed and interpreted this ECG.   Date: 09/18/2018  EKG Time: 2013  Rate: 90  Rhythm: normal sinus rhythm  Axis: borderline left axis  Intervals:none  ST&T Change: No ST elevation  ____________________________________________  RADIOLOGY  ED MD interpretation:  Not indicated.  Official radiology report(s): No results found.  ____________________________________________   PROCEDURES  Procedure(s) performed (including Critical Care):  Procedures   ____________________________________________   INITIAL IMPRESSION / ASSESSMENT AND PLAN / ED COURSE  As part of my medical decision making, I reviewed the following data within the electronic MEDICAL RECORD NUMBER Notes from prior ED visits        58 year old male presenting to the emergency department per the request of his primary care provider for abnormal labs.  On review of the office visit, it is noted that his CK is elevated at 2495 with a creatinine of 1.4.  His magnesium is low at 1.4 as is his sodium at 135.  He also has a leukocytosis at 16.2 that is not well explained.  Remainder of the labs can be viewed in care everywhere.  Patient denies pain in his extremities, but states that they often "give out" and won't hold him up.  Plan at this point will be likely admission.  Urinalysis will be collected and IV fluids started.  Patient is sitting in the room eating at this time without any specific complaints.   ----------------------------------------- 9:01 PM on 09/18/2018 -----------------------------------------  Patient to be admitted for rhabdomyolysis and acute kidney injury.  He will also receive magnesium,  thiamine, and folate.  Case discussed with Dr. Anne HahnWillis who agrees to admit.  No withdrawal symptoms currently.     ____________________________________________   FINAL CLINICAL IMPRESSION(S) / ED DIAGNOSES  Final diagnoses:  Traumatic rhabdomyolysis, initial encounter (HCC)  Weakness  Hypomagnesemia  Acute kidney injury [facility](HCC)     ED Discharge Orders    None       Note:  This document was prepared using Dragon voice recognition software and may include unintentional dictation errors.    Chinita Pesterriplett, Athene Schuhmacher B, FNP 09/18/18 2104    Sharman CheekStafford, Phillip, MD 09/18/18 2115

## 2018-09-18 NOTE — H&P (Signed)
[facility] [place] [facility]   PATIENT NAME: Kurt Horton    MR#:  [id number]  DATE OF BIRTH:  10/23/1960  DATE OF ADMISSION:  09/18/2018  PRIMARY CARE PHYSICIAN: [name], MD   REQUESTING/REFERRING PHYSICIAN: Trisha Mangle, NP  CHIEF COMPLAINT:   Chief Complaint  Patient presents with  . Weakness    HISTORY OF PRESENT ILLNESS:  Kurt Horton  is a 58 y.o. male who presents with chief complaint as above.  Patient presents the ED after being sent by primary care physician due to elevated CK.  Patient presented to her primary care physician today due to a complaint of lower extremity weakness.  He states that this is happened to him 2-3 times over the last 6 months.  He has episodes where he has lower extremity weakness without any significant muscular tenderness.  He feels like the weakness is more proximal.  Patient states he has nearly fallen several times due to this weakness.  PCP today did blood work which showed an elevated CK around 2,400.  He also has a little bit of AKI.  Hospitalist were called for admission  PAST MEDICAL HISTORY:   Past Medical History:  Diagnosis Date  . A-fib (HCC)   . Alcohol abuse   . Alcohol withdrawal (HCC) 11/12/2016  . Diabetes mellitus without complication (HCC)   . DJD (degenerative joint disease)   . Hypertension   . Myasthenia gravis (HCC)   . Renal disorder      PAST SURGICAL HISTORY:   Past Surgical History:  Procedure Laterality Date  . AMPUTATION TOE Left 12/29/2015   Procedure: AMPUTATION TOE;  Surgeon: Gwyneth Revels, DPM;  Location: [facility] ORS;  Service: Podiatry;  Laterality: Left;  . ESOPHAGOGASTRODUODENOSCOPY N/A 02/05/2018   Procedure: ESOPHAGOGASTRODUODENOSCOPY (EGD);  Surgeon: Pasty Spillers, MD;  Location: [facility] ENDOSCOPY;  Service: Endoscopy;  Laterality: N/A;  . PERIPHERAL VASCULAR CATHETERIZATION N/A 12/27/2015   Procedure: Lower Extremity Angiography;  Surgeon: Annice Needy, MD;   Location: [facility] INVASIVE CV LAB;  Service: Cardiovascular;  Laterality: N/A;  . right hip surgery    . right shoulder surgery       SOCIAL HISTORY:   Social History   Tobacco Use  . Smoking status: Current Every Day Smoker    Packs/day: 0.50    Types: Cigarettes  . Smokeless tobacco: Never Used  Substance Use Topics  . Alcohol use: Yes    Alcohol/week: 20.0 - 30.0 standard drinks    Types: 20 - 30 Shots of liquor per week    Comment: Last drink 3 days ago 06/22/2018- rum     FAMILY HISTORY:   Family History  Problem Relation Age of Onset  . Rheum arthritis Father      DRUG ALLERGIES:   Allergies  Allergen Reactions  . Nsaids Other (See Comments)    Duodenal ulcers, GI bleeding  . Other Other (See Comments)    Patient states he can't take any "mycin" medications because myasthenia gravis.    MEDICATIONS AT HOME:   Prior to Admission medications   Medication Sig Start Date End Date Taking? Authorizing Provider  cloNIDine (CATAPRES - DOSED IN MG/24 HR) 0.1 mg/24hr patch Place 1 patch onto the skin once a week. 05/30/18  Yes [provider]  diazepam (VALIUM) 2 MG tablet Take 1 tablet (2 mg total) by mouth 3 (three) times daily before meals for 5 days. 09/15/18 09/20/18 Yes Mody, Patricia Pesa, MD  diltiazem (CARDIZEM CD) 120 MG 24  hr capsule Take 1 capsule (120 mg total) by mouth daily. 06/29/18  Yes Altamese DillingVachhani, Vaibhavkumar, MD  folic acid (FOLVITE) 1 MG tablet Take 1 tablet (1 mg total) by mouth daily for 30 days. 08/29/18 09/28/18 Yes Pyreddy, Vivien RotaPavan, MD  hydrOXYzine (ATARAX/VISTARIL) 25 MG tablet Take 1 tablet (25 mg total) by mouth 3 (three) times daily as needed for anxiety. 09/07/18  Yes Shaune Pollackhen, Qing, MD  ibuprofen (ADVIL) 800 MG tablet Take 800 mg by mouth 2 (two) times daily as needed for pain. 09/18/18 09/28/18 Yes [provider]  magnesium oxide (MAG-OX) 400 MG tablet Take 400 mg by mouth daily. 09/18/18 10/18/18 Yes [provider]  metFORMIN (GLUCOPHAGE)  500 MG tablet Take 1 tablet (500 mg total) by mouth daily. 05/30/18  Yes Pucilowska, Jolanta B, MD  metoprolol succinate (TOPROL-XL) 50 MG 24 hr tablet Take 1 tablet (50 mg total) by mouth daily. Take with or immediately following a meal. 05/30/18  Yes Pucilowska, Jolanta B, MD  Multiple Vitamin (MULTIVITAMIN WITH MINERALS) TABS tablet Take 1 tablet by mouth daily. 09/08/18  Yes Shaune Pollackhen, Qing, MD  nicotine (NICODERM CQ - DOSED IN MG/24 HOURS) 14 mg/24hr patch Place 1 patch (14 mg total) onto the skin daily. 08/29/18  Yes Pyreddy, Vivien RotaPavan, MD  Oxycodone HCl 10 MG TABS Take 10 mg by mouth daily as needed for pain. 09/17/18 10/02/18 Yes [provider]  senna-docusate (SENOKOT-S) 8.6-50 MG tablet Take 1 tablet by mouth at bedtime as needed for mild constipation. 12/25/17  Yes Mayo, Allyn KennerKaty Dodd, MD  thiamine 100 MG tablet Take 1 tablet (100 mg total) by mouth daily for 30 days. 08/29/18 09/28/18 Yes Pyreddy, Vivien RotaPavan, MD  traZODone (DESYREL) 100 MG tablet Take 1 tablet (100 mg total) by mouth at bedtime. 05/30/18  Yes Pucilowska, Jolanta B, MD  guaiFENesin-dextromethorphan (ROBITUSSIN DM) 100-10 MG/5ML syrup Take 5 mLs by mouth every 4 (four) hours as needed for cough. Patient not taking: Reported on 09/18/2018 09/07/18   Shaune Pollackhen, Qing, MD  testosterone cypionate (DEPOTESTOSTERONE CYPIONATE) 200 MG/ML injection Inject 200 mg into the muscle every 14 (fourteen) days.    [provider]    REVIEW OF SYSTEMS:  Review of Systems  Constitutional: Negative for chills, fever, malaise/fatigue and weight loss.  HENT: Negative for ear pain, hearing loss and tinnitus.   Eyes: Negative for blurred vision, double vision, pain and redness.  Respiratory: Negative for cough, hemoptysis and shortness of breath.   Cardiovascular: Negative for chest pain, palpitations, orthopnea and leg swelling.  Gastrointestinal: Negative for abdominal pain, constipation, diarrhea, nausea and vomiting.  Genitourinary: Negative for dysuria,  frequency and hematuria.  Musculoskeletal: Positive for falls. Negative for back pain, joint pain and neck pain.  Skin:       No acne, rash, or lesions  Neurological: Positive for weakness. Negative for dizziness, tremors and focal weakness.  Endo/Heme/Allergies: Negative for polydipsia. Does not bruise/bleed easily.  Psychiatric/Behavioral: Negative for depression. The patient is not nervous/anxious and does not have insomnia.      VITAL SIGNS:   Vitals:   09/18/18 1905 09/18/18 1906 09/18/18 2032  BP: 132/73  109/64  Pulse: (!) 111  91  Resp: 20  13  Temp: (!) 97.5 F (36.4 C)    TempSrc: Oral    SpO2: 100%  100%  Weight:  83.9 kg   Height:  5\' 11"  (1.803 m)    Wt Readings from Last 3 Encounters:  09/18/18 83.9 kg  09/16/18 84 kg  09/14/18 84 kg  PHYSICAL EXAMINATION:  Physical Exam  Vitals reviewed. Constitutional: He is oriented to person, place, and time. He appears well-developed and well-nourished. No distress.  HENT:  Head: Normocephalic and atraumatic.  Mouth/Throat: Oropharynx is clear and moist.  Eyes: Pupils are equal, round, and reactive to light. Conjunctivae and EOM are normal. No scleral icterus.  Neck: Normal range of motion. Neck supple. No JVD present. No thyromegaly present.  Cardiovascular: Normal rate, regular rhythm and intact distal pulses. Exam reveals no gallop and no friction rub.  No murmur heard. Respiratory: Effort normal and breath sounds normal. No respiratory distress. He has no wheezes. He has no rales.  GI: Soft. Bowel sounds are normal. He exhibits no distension. There is no abdominal tenderness.  Musculoskeletal: Normal range of motion.        General: No edema.     Comments: No arthritis, no gout  Lymphadenopathy:    He has no cervical adenopathy.  Neurological: He is alert and oriented to person, place, and time. No cranial nerve deficit.  No dysarthria, no aphasia, he does have bilateral proximal greater than distal lower  extremity weakness with strength of 3/5 bilaterally.  Skin: Skin is warm and dry. No rash noted. No erythema.  Psychiatric: He has a normal mood and affect. His behavior is normal. Judgment and thought content normal.    LABORATORY PANEL:   CBC Recent Labs  Lab 09/15/18 0453  WBC 14.1*  HGB 12.9*  HCT 40.2  PLT 268   ------------------------------------------------------------------------------------------------------------------  Chemistries  Recent Labs  Lab 09/15/18 0453 09/16/18 1016  NA 137 138  K 3.4* 4.1  CL 101 104  CO2 27 24  GLUCOSE 114* 101*  BUN 9 11  CREATININE 0.76 0.77  CALCIUM 9.1 9.1  MG 1.9  --    ------------------------------------------------------------------------------------------------------------------  Cardiac Enzymes No results for input(s): TROPONINI in the last 168 hours. ------------------------------------------------------------------------------------------------------------------  RADIOLOGY:  No results found.  EKG:   Orders placed or performed during the hospital encounter of 09/18/18  . ED EKG  . ED EKG    IMPRESSION AND PLAN:  Principal Problem:   Rhabdomyolysis -unclear underlying etiology at this point.  We will admit him with IV fluids and recheck his CK in the morning.  We will also check some initial blood work for something like polymyositis.  Patient is not on any statins or steroids, and has not been per his report. Active Problems:   AKI (acute kidney injury) (HCC) -likely related to his rhabdomyolysis, versus also some dehydration.  IV fluids tonight, avoid nephrotoxins, monitor for improvement   Type II diabetes mellitus with manifestations (HCC) -sliding scale insulin coverage   HTN (hypertension) -blood pressures currently low normotensive, hold antihypertensives for now   Alcohol use disorder, severe, dependence (HCC) -CIWA protocol   Chronic combined systolic and diastolic CHF (congestive heart failure)  (HCC) -continue home meds   PAF (paroxysmal atrial fibrillation) (HCC) -continue rate controlling medications once his blood pressure improved some, currently rate controlled  Chart review performed and case discussed with ED provider. Labs, imaging and/or ECG reviewed by provider and discussed with patient/family. Management plans discussed with the patient and/or family.  COVID-19 status: Tested negative     DVT PROPHYLAXIS: SubQ lovenox   GI PROPHYLAXIS:  None  ADMISSION STATUS: Inpatient     CODE STATUS: Full Code Status History    Date Active Date Inactive Code Status Order ID Comments User Context   09/14/2018 1557 09/15/2018 1908 Full Code [id number]  Elisabeth Pigeon,  Heath Gold, MD Inpatient   08/26/2018 1055 08/28/2018 1423 Full Code [id number]  Milagros Loll, MD ED   08/26/2018 [id number] 08/26/2018 1055 Full Code [id number]  Arnaldo Natal, MD ED   08/17/2018 1924 08/20/2018 1838 Full Code [id number]  Milagros Loll, MD ED   06/24/2018 1438 06/28/2018 2141 Full Code [id number]  Altamese Dilling, MD ED   06/24/2018 1207 06/24/2018 1438 Full Code [id number]  Arnaldo Natal, MD ED   06/12/2018 0350 06/13/2018 0101 Full Code [id number]  Darci Current, MD ED   05/25/2018 1609 05/30/2018 1913 Full Code [id number]  Luciano Cutter, DO Inpatient   05/17/2018 0121 05/21/2018 1635 Full Code [id number]  Mayo, Allyn Kenner, MD Inpatient   04/15/2018 1145 04/19/2018 2132 Full Code [id number]  Delfino Lovett, MD Inpatient   04/03/2018 0547 04/10/2018 2052 Full Code [id number]  Barbaraann Rondo, MD ED   02/04/2018 1528 02/07/2018 1728 Full Code [id number]  Mayo, Allyn Kenner, MD ED   02/03/2018 2233 02/04/2018 0841 Full Code [id number]  Jene Every, MD ED   01/20/2018 0140 01/22/2018 1819 Full Code [id number]  Barbaraann Rondo, MD ED   01/19/2018 1239 01/20/2018 0140 Full Code [id number]  Myrna Blazer, MD ED   12/22/2017 0841 12/25/2017 2031 Full Code [id number]  Barbaraann Rondo, MD Inpatient   10/27/2017 1522  11/02/2017 1432 Full Code [id number]  Milagros Loll, MD ED   10/21/2017 1945 10/23/2017 1833 Full Code [id number]  Auburn Bilberry, MD Inpatient   10/21/2017 1504 10/21/2017 1945 Full Code [id number]  Arnaldo Natal, MD ED   10/10/2017 1314 10/11/2017 1147 Full Code [id number]  Willy Eddy, MD ED   10/05/2017 2214 10/07/2017 2101 Full Code [id number]  Arnaldo Natal, MD ED   10/05/2017 1924 10/05/2017 2214 Full Code [id number]  Arnaldo Natal, MD ED   10/01/2017 0807 10/04/2017 1650 Full Code [id number]  Barbaraann Rondo, MD Inpatient   08/24/2017 1756 08/27/2017 1849 Full Code [id number]  Enid Baas, MD ED   08/14/2017 0030 08/18/2017 1605 Full Code [id number]  Oralia Manis, MD Inpatient   06/04/2017 0116 06/06/2017 1701 Full Code [id number]  Oralia Manis, MD Inpatient   03/19/2017 1559 03/20/2017 2128 Full Code [id number]  Arnaldo Natal, MD ED   02/25/2017 1421 03/01/2017 1829 Full Code [id number]  Houston Siren, MD Inpatient   11/12/2016 1533 11/14/2016 1425 Full Code [id number]  Marguarite Arbour, MD Inpatient   12/26/2015 1927 12/30/2015 1604 Full Code [id number]  Marguarite Arbour, MD Inpatient      TOTAL TIME TAKING CARE OF THIS PATIENT: 45 minutes.   This patient was evaluated in the context of the global COVID-19 pandemic, which necessitated consideration that the patient might be at risk for infection with the SARS-CoV-2 virus that causes COVID-19. Institutional protocols and algorithms that pertain to the evaluation of patients at risk for COVID-19 are in a state of rapid change based on information released by regulatory bodies including the CDC and federal and state organizations. These policies and algorithms were followed to the best of this provider's knowledge to date during the patient's care at this facility.  Barney Drain 09/18/2018, 9:10 PM  [facility]  Office  [phone]  CC: Primary care physician; [name], MD  Note:  This document was prepared  using Dragon voice recognition software and may include unintentional dictation errors.

## 2018-09-18 NOTE — ED Notes (Signed)
ED TO INPATIENT HANDOFF REPORT  ED Nurse Name and Phone #: Lurena JoinerRebecca [phone]  S Name/Age/Gender Kurt Horton 58 y.o. male Room/Bed: ED15A/ED15A  Code Status   Code Status: Prior  Home/SNF/Other Home Patient oriented to: self, place, time and situation Is this baseline? Yes   Triage Complete: Triage complete  Chief Complaint Legs weak  Triage Note Pt to triage via w/c with no distress noted, reports was told by PCP to come to ED for further evaluation due to abnormal labs; pt reports leg weakness with frequent falls    Allergies Allergies  Allergen Reactions  . Nsaids Other (See Comments)    Duodenal ulcers, GI bleeding  . Other Other (See Comments)    Patient states he can't take any "mycin" medications because myasthenia gravis.    Level of Care/Admitting Diagnosis ED Disposition    ED Disposition Condition Comment   Admit  [facility] [[id number]]  Level of Care: Med-Surg [16]  Covid Evaluation: N/A  Diagnosis: Rhabdomyolysis [728.88.ICD-9-CM]  Admitting Physician: Kurt Horton [[id number]]  Attending Physician: Kurt Horton [phone][[id number]]  Estimated length of stay: past midnight tomorrow  Certification:: I certify this patient will need inpatient services for at least 2 midnights  PT Class (Do Not Modify): Inpatient [101]  PT Acc Code (Do Not Modify): Private [1]       B Medical/Surgery History Past Medical History:  Diagnosis Date  . A-fib (HCC)   . Alcohol abuse   . Alcohol withdrawal (HCC) 11/12/2016  . Diabetes mellitus without complication (HCC)   . DJD (degenerative joint disease)   . Hypertension   . Myasthenia gravis (HCC)   . Renal disorder    Past Surgical History:  Procedure Laterality Date  . AMPUTATION TOE Left 12/29/2015   Procedure: AMPUTATION TOE;  Surgeon: Gwyneth RevelsJustin Fowler, DPM;  Location: [facility] ORS;  Service: Podiatry;  Laterality: Left;  . ESOPHAGOGASTRODUODENOSCOPY N/A 02/05/2018   Procedure:  ESOPHAGOGASTRODUODENOSCOPY (EGD);  Surgeon: Pasty Spillersahiliani, Varnita B, MD;  [facility];  Service: Endoscopy;  Laterality: N/A;  . PERIPHERAL VASCULAR CATHETERIZATION N/A 12/27/2015   Procedure: Lower Extremity Angiography;  Surgeon: Annice NeedyJason S Dew, MD;  Location: [facility] INVASIVE CV LAB;  Service: Cardiovascular;  Laterality: N/A;  . right hip surgery    . right shoulder surgery       A IV Location/Drains/Wounds Patient Lines/Drains/Airways Status   Active Line/Drains/Airways    Name:   Placement date:   Placement time:   Site:   Days:   Peripheral IV 09/16/18 Right Forearm   09/16/18    1024    Forearm   2   Peripheral IV 09/18/18 Right Forearm   09/18/18    2023    Forearm   less than 1          Intake/Output Last 24 hours No intake or output data in the 24 hours ending 09/18/18 2118  Labs/Imaging No results found for this or any previous visit (from the past 48 hour(s)). No results found.  Pending Labs Unresulted Labs (From admission, onward)    Start     Ordered   09/18/18 2005  Urinalysis, Complete w Microscopic  Once,   STAT     09/18/18 2004   Signed and Held  CK  Tomorrow morning,   R     Signed and Held   Signed and Held  CBC  (enoxaparin (LOVENOX)    CrCl >/= 30 ml/min)  Once,   R    Comments:  Baseline for enoxaparin therapy IF NOT ALREADY DRAWN.  Notify MD if PLT < 100 K.    Signed and Held   Signed and Held  Creatinine, serum  (enoxaparin (LOVENOX)    CrCl >/= 30 ml/min)  Once,   R    Comments:  Baseline for enoxaparin therapy IF NOT ALREADY DRAWN.    Signed and Held   Signed and Held  Creatinine, serum  (enoxaparin (LOVENOX)    CrCl >/= 30 ml/min)  Weekly,   R    Comments:  while on enoxaparin therapy    Signed and Held   Signed and Held  Basic metabolic panel  Tomorrow morning,   R     Signed and Held   Signed and Held  CBC  Tomorrow morning,   R     Signed and Held          Vitals/Pain Today's Vitals   09/18/18 1905 09/18/18 1906 09/18/18 2032   BP: 132/73  109/64  Pulse: (!) 111  91  Resp: 20  13  Temp: (!) 97.5 F (36.4 C)    TempSrc: Oral    SpO2: 100%  100%  Weight:  83.9 kg   Height:  5\' 11"  (1.803 m)   PainSc:  8      Isolation Precautions No active isolations  Medications Medications  magnesium sulfate IVPB 2 g 50 mL (2 g Intravenous New Bag/Given 09/18/18 2027)  folic acid injection 1 mg (has no administration in time range)  thiamine (B-1) injection 100 mg (has no administration in time range)  magnesium sulfate IVPB 2 g 50 mL (has no administration in time range)  sodium chloride 0.9 % bolus 1,000 mL (1,000 mLs Intravenous New Bag/Given 09/18/18 2027)    Mobility walks Moderate fall risk   Focused Assessments Cardiac Assessment Handoff:    Lab Results  Component Value Date   CKTOTAL 589 (H) 04/15/2018   TROPONINI <0.03 06/24/2018   No results found for: DDIMER Does the Patient currently have chest pain? No     R Recommendations: See Admitting Provider Note  Report given to:   Additional Notes:

## 2018-09-19 LAB — BASIC METABOLIC PANEL
Anion gap: 7 (ref 5–15)
BUN: 16 mg/dL (ref 6–20)
CO2: 22 mmol/L (ref 22–32)
Calcium: 8.1 mg/dL — ABNORMAL LOW (ref 8.9–10.3)
Chloride: 108 mmol/L (ref 98–111)
Creatinine, Ser: 1.18 mg/dL (ref 0.61–1.24)
GFR calc Af Amer: >60 mL/min (ref >60)
GFR calc non Af Amer: >60 mL/min (ref >60)
Glucose, Bld: 121 mg/dL — ABNORMAL HIGH (ref 70–99)
Potassium: 3.7 mmol/L (ref 3.5–5.1)
Sodium: 137 mmol/L (ref 135–145)

## 2018-09-19 LAB — CBC
HCT: 33.6 % — ABNORMAL LOW (ref 39.0–52.0)
Hemoglobin: 10.5 g/dL — ABNORMAL LOW (ref 13.0–17.0)
MCH: 29.7 pg (ref 26.0–34.0)
MCHC: 31.3 g/dL (ref 30.0–36.0)
MCV: 94.9 fL (ref 80.0–100.0)
Platelets: 235 10*3/uL (ref 150–400)
RBC: 3.54 MIL/uL — ABNORMAL LOW (ref 4.22–5.81)
RDW: 19.4 % — ABNORMAL HIGH (ref 11.5–15.5)
WBC: 7.6 10*3/uL (ref 4.0–10.5)
nRBC: 0 % (ref 0.0–0.2)

## 2018-09-19 LAB — VITAMIN B12: Vitamin B-12: 218 pg/mL (ref 180–914)

## 2018-09-19 LAB — GLUCOSE, CAPILLARY
Glucose-Capillary: 149 mg/dL — ABNORMAL HIGH (ref 70–99)
Glucose-Capillary: 117 mg/dL — ABNORMAL HIGH (ref 70–99)
Glucose-Capillary: 134 mg/dL — ABNORMAL HIGH (ref 70–99)
Glucose-Capillary: 120 mg/dL — ABNORMAL HIGH (ref 70–99)

## 2018-09-19 LAB — TSH: TSH: 0.528 u[IU]/mL (ref 0.350–4.500)

## 2018-09-19 LAB — C-REACTIVE PROTEIN: CRP: 7.9 mg/dL — ABNORMAL HIGH (ref <1.0)

## 2018-09-19 LAB — CK: Total CK: 1735 U/L — ABNORMAL HIGH (ref 49–397)

## 2018-09-19 MED ORDER — OXYCODONE HCL 5 MG PO TABS
10.0000 mg | ORAL_TABLET | Freq: Four times a day (QID) | ORAL | Status: DC | PRN
  Administered 2018-09-19: 5 mg via ORAL
  Administered 2018-09-20 (×2): 10 mg via ORAL
  Filled 2018-09-19 (×4): qty 2

## 2018-09-19 NOTE — Progress Notes (Addendum)
Pt complained that pain not resolved with 5 mg Oxy IR.  Received order for 10 mg Oxy IR.  Gave pt additional 5 mg to make 10 mg at this time.  Pt with moderate anxiety and ativan 1 mg po given.  Changed pts bed and provided snack.  Pt complains of buttock hurting .  Assessed and mild edema, blanchable redness from wrinkled sheets.  Changed bed pad, washed skin with incontinence cleaner and then applied 24 hours barrier cream .  Pt asking for gabapentin 3 x day for leg pain that he takes at home. Not listed as a home med.  Will pass on to day shift to discuss with hospitalist. Henriette Combs RN

## 2018-09-19 NOTE — TOC Initial Note (Signed)
Transition of Care [facility]) - Initial/Assessment Note    Patient Details  Name: Kurt Horton MRN: [id number] Date of Birth: [birth year]  Transition of Care [facility]) CM/SW Contact:    Su Hilt, RN Phone Number: 09/19/2018, 10:34 AM  Clinical Narrative:                 Met with the patient to discuss DC plan and needs Patient drives and uses uber for transportation Lives at home with his parents He was given outpatient resources for ETOH detox on 05/11  He sees Dr. Lucien Mons as PCP  He uses [facility] as pharmacy  He has not used [facility] [employer] medications such as metoprolol  And metformin he said that he can afford the medications at [place] He has applied for medicaid again  He is on disability and has that for income He can use Outpatient PT if needed and would use [facility] CM will continue to Monitor for needs    Expected Discharge Plan: Home/Self Care Barriers to Discharge: Continued Medical Work up   Patient Goals and CMS Choice Patient states their goals for this hospitalization and ongoing recovery are:: get his kidneys flushed and later walk CMS Medicare.gov Compare Post Acute Care list provided to:: Patient    Expected Discharge Plan and Services Expected Discharge Plan: Home/Self Care   Discharge Planning Services: CM Consult Post Acute Care Choice: [place] arrangements for the past 2 months: Single Family Home                                      Prior Living Arrangements/Services Living arrangements for the past 2 months: Single Family Home Lives with:: Parents Patient language and need for interpreter reviewed:: No Do you feel safe going back to the place where you live?: Yes      Need for Family Participation in Patient Care: No (Comment) Care giver support system in place?: Yes (comment) Current home services: DME(walker, cane) Criminal Activity/Legal Involvement Pertinent to Current  Situation/Hospitalization: No - Comment as needed  Activities of Daily Living Home Assistive Devices/Equipment: Cane (specify quad or straight) ADL Screening (condition at time of admission) Patient's cognitive ability adequate to safely complete daily activities?: Yes Is the patient deaf or have difficulty hearing?: No Does the patient have difficulty seeing, even when wearing glasses/contacts?: No Does the patient have difficulty concentrating, remembering, or making decisions?: No Patient able to express need for assistance with ADLs?: Yes Does the patient have difficulty dressing or bathing?: No Independently performs ADLs?: Yes (appropriate for developmental age) Communication: Independent Dressing (OT): Independent Grooming: Independent Feeding: Independent Bathing: Independent Toileting: Independent In/Out Bed: Needs assistance Is this a change from baseline?: Change from baseline, expected to last <3 days Walks in Home: Needs assistance Is this a change from baseline?: Change from baseline, expected to last <3 days Does the patient have difficulty walking or climbing stairs?: Yes Weakness of Legs: Both Weakness of Arms/Hands: None  Permission Sought/Granted                  Emotional Assessment Appearance:: Appears stated age Attitude/Demeanor/Rapport: Engaged Affect (typically observed): Accepting Orientation: : Oriented to Self, Oriented to Place, Oriented to  Time, Oriented to Situation Alcohol / Substance Use: Alcohol Use, Tobacco Use Psych Involvement: No (comment)  Admission diagnosis:  Hypomagnesemia [E83.42] Weakness [R53.1] Acute kidney injury ([place]) [N17.9] Traumatic rhabdomyolysis,  initial encounter ([place]) [T79.6XXA] Patient Active Problem List   Diagnosis Date Noted  . Rhabdomyolysis 09/18/2018  . AKI (acute kidney injury) ([place]) 09/18/2018  . Alcohol withdrawal ([place]) 08/26/2018  . PAF (paroxysmal atrial fibrillation) ([place]) 08/17/2018  . Tobacco  use disorder 05/27/2018  . GI bleed 02/04/2018  . Alcohol intoxication ([place]) 12/22/2017  . Malnutrition of moderate degree 27-May-202019  . Chronic combined systolic and diastolic CHF (congestive heart failure) ([place]) 08/13/2017  . Alcohol withdrawal delirium ([place]) 02/26/2017  . Atrial fibrillation with RVR ([place]) 02/25/2017  . Acute alcoholic intoxication with complication ([place])   . Alcohol use disorder, severe, dependence ([place])   . Substance induced mood disorder ([place]) 11/13/2016  . SVT (supraventricular tachycardia) ([place]) 11/12/2016  . HTN (hypertension) 11/12/2016  . EtOH dependence ([place]) 11/12/2016  . Lumbar radiculopathy 08/24/2016  . Chronic low back pain 01/04/2016  . Pressure ulcer 12/29/2015  . Osteomyelitis ([place]) 12/26/2015  . PVD (peripheral vascular disease) ([place]) 12/26/2015  . Type II diabetes mellitus with manifestations ([place]) 12/26/2015  . Myasthenia gravis ([place]) 12/26/2015   PCP:  Tracie Harrier, MD Pharmacy:   [facility], [address] [place] [address] Phone: [phone] Fax: [phone]  [facility] [place], [address] [place] [address] Phone: [phone] Fax: [phone]  [facility] [place], [address] [place] [address] Phone: [phone] Fax: [phone]     Social Determinants of Health (SDOH) Interventions    Readmission Risk Interventions Readmission Risk Prevention Plan 09/15/2018 08/19/2018  Transportation Screening Complete Complete  Medication Review (RN Care Manager) Complete Complete  PCP or Specialist appointment within 3-5 days of discharge Patient refused Complete  HRI or Home Care Consult Patient refused Complete  SW Recovery Care/Counseling Consult Complete Complete  Palliative Care Screening Not Applicable Not [place]  Not Applicable Not Applicable  Some recent data might be hidden

## 2018-09-19 NOTE — Progress Notes (Signed)
[facility] - [place] at [facility]   PATIENT NAME: Kurt Horton    MR#:  [id number]  DATE OF BIRTH:  1960/06/15  SUBJECTIVE:  CHIEF COMPLAINT:   Chief Complaint  Patient presents with  . Weakness   - patient with multiple admissions and ER visits within the last 3 months -Complains of neuropathy in both feet and weakness.  REVIEW OF SYSTEMS:  Review of Systems  Constitutional: Positive for malaise/fatigue. Negative for chills and fever.  HENT: Negative for congestion, ear discharge, hearing loss and nosebleeds.   Eyes: Negative for blurred vision and double vision.  Respiratory: Negative for cough, shortness of breath and wheezing.   Cardiovascular: Positive for leg swelling. Negative for chest pain and palpitations.  Gastrointestinal: Negative for abdominal pain, constipation, diarrhea, nausea and vomiting.  Genitourinary: Negative for dysuria.  Musculoskeletal: Positive for falls and myalgias.  Neurological: Positive for tremors and sensory change. Negative for dizziness, focal weakness, seizures, weakness and headaches.    DRUG ALLERGIES:   Allergies  Allergen Reactions  . Nsaids Other (See Comments)    Duodenal ulcers, GI bleeding  . Other Other (See Comments)    Patient states he can't take any "mycin" medications because myasthenia gravis.    VITALS:  Blood pressure 98/65, pulse 89, temperature 98.4 F (36.9 C), temperature source Oral, resp. rate 20, height 5\' 11"  (1.803 m), weight 83.9 kg, SpO2 96 %.  PHYSICAL EXAMINATION:  Physical Exam   GENERAL:  58 y.o.-year-old patient lying in the bed with no acute distress.  EYES: Pupils equal, round, reactive to light and accommodation. No scleral icterus. Extraocular muscles intact.  HEENT: Head atraumatic, normocephalic. Oropharynx and nasopharynx clear.  NECK:  Supple, no jugular venous distention. No thyroid enlargement, no tenderness.  LUNGS: Normal breath sounds bilaterally, no wheezing,  rales,rhonchi or crepitation. No use of accessory muscles of respiration.  Decreased bibasilar breath sounds noted CARDIOVASCULAR: S1, S2 normal. No murmurs, rubs, or gallops.  ABDOMEN: Soft, nontender, nondistended. Bowel sounds present. No organomegaly or mass.  EXTREMITIES: No  cyanosis, or clubbing. 2+ lower extremity edema noted. NEUROLOGIC: Cranial nerves II through XII are intact. Muscle strength 5/5 in all extremities. Sensation intact. Gait not checked. Global weakness- much weaker in lower extremities PSYCHIATRIC: The patient is alert and oriented x 3.  SKIN: No obvious rash, lesion, or ulcer.    LABORATORY PANEL:   CBC Recent Labs  Lab 09/19/18 0433  WBC 7.6  HGB 10.5*  HCT 33.6*  PLT 235   ------------------------------------------------------------------------------------------------------------------  Chemistries  Recent Labs  Lab 09/15/18 0453  09/19/18 0433  NA 137   < > 137  K 3.4*   < > 3.7  CL 101   < > 108  CO2 27   < > 22  GLUCOSE 114*   < > 121*  BUN 9   < > 16  CREATININE 0.76   < > 1.18  CALCIUM 9.1   < > 8.1*  MG 1.9  --   --    < > = values in this interval not displayed.   ------------------------------------------------------------------------------------------------------------------  Cardiac Enzymes Recent Labs  Lab 09/18/18 2237  TROPONINI <0.03   ------------------------------------------------------------------------------------------------------------------  RADIOLOGY:  No results found.  EKG:   Orders placed or performed during the hospital encounter of 09/18/18  . ED EKG  . ED EKG    ASSESSMENT AND PLAN:   58 year old male with past medical history significant for alcohol abuse and multiple admissions and ER visits within  the last 3 months, diabetes, hypertension, A. fib not on anticoagulation presents to hospital secondary to weakness and falls.  1.  Rhabdomyolysis-concern for relation to alcohol abuse.  Still has some  tremors. -Urine analysis with no myoglobinuria.  Received IV fluids and slow drop in CPK noted. -Continue fluids for today and monitor CPK -ANA ordered, check aldolase and B12, vitamin D levels as well  2.  Acute kidney injury-secondary to prerenal causes.  Improved with IV fluids  3.  Alcohol abuse disorder-severe dependence.  Patient states he has not had a drink in 9 days.  Was planning to go into the inpatient alcohol rehab program. -Monitor on CIWA protocol  4.  Paroxysmal atrial fibrillation-rate controlled.  Not a good candidate for anticoagulation given his drinking. -Monitor for now.  5.  DVT prophylaxis-Lovenox  Consult physical therapy for weakness    All the records are reviewed and case discussed with Care Management/Social Workerr. Management plans discussed with the patient, family and they are in agreement.  CODE STATUS: Full Code  TOTAL TIME TAKING CARE OF THIS PATIENT: 38 minutes.   POSSIBLE D/C IN 2 DAYS, DEPENDING ON CLINICAL CONDITION.   Enid Baasadhika Keyen Marban M.D on 09/19/2018 at 2:24 PM  Between 7am to 6pm - Pager - [phone]  After 6pm go to [email or web address] - Social research officer, governmentpassword EPAS [facility]  [facility] [place] [facility]  Office  [phone]  CC: Primary care physician; Barbette ReichmannHande, Vishwanath, MD

## 2018-09-20 LAB — BASIC METABOLIC PANEL WITH GFR
Anion gap: 5 (ref 5–15)
BUN: 14 mg/dL (ref 6–20)
CO2: 24 mmol/L (ref 22–32)
Calcium: 8.1 mg/dL — ABNORMAL LOW (ref 8.9–10.3)
Chloride: 108 mmol/L (ref 98–111)
Creatinine, Ser: 0.78 mg/dL (ref 0.61–1.24)
GFR calc Af Amer: >60 mL/min (ref >60)
GFR calc non Af Amer: >60 mL/min (ref >60)
Glucose, Bld: 129 mg/dL — ABNORMAL HIGH (ref 70–99)
Potassium: 4.2 mmol/L (ref 3.5–5.1)
Sodium: 137 mmol/L (ref 135–145)

## 2018-09-20 LAB — VITAMIN D 25 HYDROXY (VIT D DEFICIENCY, FRACTURES): Vit D, 25-Hydroxy: 14.3 ng/mL — ABNORMAL LOW (ref 30.0–100.0)

## 2018-09-20 LAB — ANA COMPREHENSIVE PANEL
Anti JO-1: <0.2 AI (ref 0.0–0.9)
Centromere Ab Screen: <0.2 AI (ref 0.0–0.9)
Chromatin Ab SerPl-aCnc: <0.2 AI (ref 0.0–0.9)
ENA SM Ab Ser-aCnc: <0.2 AI (ref 0.0–0.9)
Ribonucleic Protein: <0.2 AI (ref 0.0–0.9)
SSA (Ro) (ENA) Antibody, IgG: <0.2 AI (ref 0.0–0.9)
SSB (La) (ENA) Antibody, IgG: 0.9 AI (ref 0.0–0.9)
Scleroderma (Scl-70) (ENA) Antibody, IgG: <0.2 AI (ref 0.0–0.9)
ds DNA Ab: <1 [IU]/mL (ref 0–9)

## 2018-09-20 LAB — ANTI-SMOOTH MUSCLE ANTIBODY, IGG: F-Actin IgG: 19 Units (ref 0–19)

## 2018-09-20 LAB — SJOGRENS SYNDROME-A EXTRACTABLE NUCLEAR ANTIBODY: SSA (Ro) (ENA) Antibody, IgG: <0.2 AI (ref 0.0–0.9)

## 2018-09-20 LAB — GLUCOSE, CAPILLARY
Glucose-Capillary: 139 mg/dL — ABNORMAL HIGH (ref 70–99)
Glucose-Capillary: 132 mg/dL — ABNORMAL HIGH (ref 70–99)

## 2018-09-20 LAB — ANTI-JO 1 ANTIBODY, IGG: Anti JO-1: <0.2 AI (ref 0.0–0.9)

## 2018-09-20 LAB — SJOGRENS SYNDROME-B EXTRACTABLE NUCLEAR ANTIBODY: SSB (La) (ENA) Antibody, IgG: 1 AI — ABNORMAL HIGH (ref 0.0–0.9)

## 2018-09-20 LAB — CK: Total CK: 509 U/L — ABNORMAL HIGH (ref 49–397)

## 2018-09-20 LAB — ANA: Anti Nuclear Antibody (ANA): POSITIVE — AB

## 2018-09-20 LAB — ALDOLASE: Aldolase: 8 U/L (ref 3.3–10.3)

## 2018-09-20 MED ORDER — TRAZODONE HCL 100 MG PO TABS: 100.0000 mg | ORAL_TABLET | Freq: Every day | ORAL | 1 refills | Status: AC

## 2018-09-20 MED ORDER — FOLIC ACID 1 MG PO TABS: 1.0000 mg | ORAL_TABLET | Freq: Every day | ORAL | Status: DC

## 2018-09-20 MED ORDER — CYANOCOBALAMIN 500 MCG PO TABS
250.0000 ug | ORAL_TABLET | Freq: Every day | ORAL | Status: DC
  Administered 2018-09-20: 250 ug via ORAL
  Filled 2018-09-20: qty 1

## 2018-09-20 MED ORDER — CYANOCOBALAMIN 250 MCG PO TABS: 250.0000 ug | ORAL_TABLET | Freq: Every day | ORAL | 2 refills | Status: AC

## 2018-09-20 MED ORDER — THIAMINE HCL 100 MG PO TABS: 100.0000 mg | ORAL_TABLET | Freq: Every day | ORAL | 0 refills | Status: AC

## 2018-09-20 MED ORDER — GABAPENTIN 300 MG PO CAPS: 300.0000 mg | ORAL_CAPSULE | Freq: Three times a day (TID) | ORAL | 0 refills | Status: AC

## 2018-09-20 MED ORDER — GABAPENTIN 100 MG PO CAPS
200.0000 mg | ORAL_CAPSULE | Freq: Three times a day (TID) | ORAL | Status: DC
  Administered 2018-09-20: 200 mg via ORAL
  Filled 2018-09-20: qty 2

## 2018-09-20 MED ORDER — VITAMIN D (ERGOCALCIFEROL) 1.25 MG (50000 UNIT) PO CAPS
50000.0000 [IU] | ORAL_CAPSULE | ORAL | Status: DC
  Administered 2018-09-20: 50000 [IU] via ORAL
  Filled 2018-09-20: qty 1

## 2018-09-20 MED ORDER — VITAMIN D (ERGOCALCIFEROL) 1.25 MG (50000 UNIT) PO CAPS: 50000.0000 [IU] | ORAL_CAPSULE | ORAL | 0 refills | Status: AC

## 2018-09-20 MED ORDER — CYANOCOBALAMIN 1000 MCG/ML IJ SOLN
1000.0000 ug | Freq: Once | INTRAMUSCULAR | Status: DC
  Filled 2018-09-20: qty 1

## 2018-09-20 NOTE — TOC Progression Note (Signed)
Transition of Care [facility]) - Progression Note    Patient Details  Name: Kurt Horton MRN: [id number] Date of Birth: July 21, 1960  Transition of Care [facility]) CM/SW Contact  Barrie Dunker, RN Phone Number: 09/20/2018, 2:20 PM  Clinical Narrative:     Nida Boatman with [employer] called and stated that the patient makes too much income to qualify for charity care for a wheelchair, I notified the patient that is he needs a wheelchair he would have to pay out of pocket, he stated that he does not need one now,  Bar[facility] with [facility] called and stated that the patient does not meet the financial hardship guideline to qualify for [facility] health the patient declines to self pay for the home health services.  Medication was provided by Medication management except for the trazadone, B12, thiamine, they are unable to assist with those medications The patient has been provided with a taxi voucher to get home since he has no ride home. No further needs  Expected Discharge Plan: Home/Self Care Barriers to Discharge: Continued Medical Work up  Expected Discharge Plan and Services Expected Discharge Plan: Home/Self Care   Discharge Planning Services: CM Consult Post Acute Care Choice: Home Health Living arrangements for the past 2 months: Single Family Home Expected Discharge Date: 09/20/18               DME Arranged: Wheelchair manual DME Agency: [facility] Date DME Agency Contacted: 09/20/18 Time DME Agency Contacted: 1047 Representative spoke with at DME Agency: Nida Boatman HH Arranged: PT HH Agency: [facility] Date HH Agency Contacted: 09/20/18 Time HH Agency Contacted: [phone] Representative spoke with at [facility] Agency: Barbara Cower   Social Determinants of Health (SDOH) Interventions    Readmission Risk Interventions Readmission Risk Prevention Plan 09/20/2018 09/15/2018 08/19/2018  Transportation Screening Complete Complete Complete  Medication Review Oceanographer) Complete  Complete Complete  PCP or Specialist appointment within 3-5 days of discharge - Patient refused Complete  HRI or Home Care Consult - Patient refused Complete  SW Recovery Care/Counseling Consult - Complete Complete  Palliative Care Screening - Not Applicable Not Applicable  Skilled Nursing Facility - Not Applicable Not Applicable  Some recent data might be hidden

## 2018-09-20 NOTE — Progress Notes (Signed)
DISCHARGE NOTE:  Pt given discharge instructions and scripts (gabapentin/ vitamin  D medication). Pt wheeled to car by staff.

## 2018-09-20 NOTE — TOC Progression Note (Signed)
Transition of Care [facility]) - Progression Note    Patient Details  Name: Kurt Horton MRN: [id number] Date of Birth: 05/20/1960  Transition of Care [facility]) CM/SW Contact  Barrie Dunker, RN Phone Number: 09/20/2018, 12:26 PM  Clinical Narrative:     Barbara Cower with [facility] has been trying to reach the patient via telephone without success, I gave the patient the number to [facility] with [facility] and requested him to call  To give information that they need to  make arrangements for [facility]  Expected Discharge Plan: Home/Self Care Barriers to Discharge: Continued Medical Work up  Expected Discharge Plan and Services Expected Discharge Plan: Home/Self Care   Discharge Planning Services: CM Consult Post Acute Care Choice: Home Health Living arrangements for the past 2 months: Single Family Home Expected Discharge Date: 09/20/18               DME Arranged: Wheelchair manual DME Agency: [facility] Date DME Agency Contacted: 09/20/18 Time DME Agency Contacted: 1047 Representative spoke with at DME Agency: Nida Boatman HH Arranged: PT HH Agency: [facility] Date HH Agency Contacted: 09/20/18 Time HH Agency Contacted: [phone] Representative spoke with at [facility] Agency: Barbara Cower   Social Determinants of Health (SDOH) Interventions    Readmission Risk Interventions Readmission Risk Prevention Plan 09/20/2018 09/15/2018 08/19/2018  Transportation Screening Complete Complete Complete  Medication Review Oceanographer) Complete Complete Complete  PCP or Specialist appointment within 3-5 days of discharge - Patient refused Complete  HRI or Home Care Consult - Patient refused Complete  SW Recovery Care/Counseling Consult - Complete Complete  Palliative Care Screening - Not Applicable Not Applicable  Skilled Nursing Facility - Not Applicable Not Applicable  Some recent data might be hidden

## 2018-09-20 NOTE — TOC Progression Note (Signed)
Transition of Care [facility]) - Progression Note    Patient Details  Name: Kurt Horton MRN: [id number] Date of Birth: August 31, 1960  Transition of Care [facility]) CM/SW Contact  Collie Siad, RN Phone Number: 09/20/2018, 12:05 PM  Clinical Narrative:    Late note entry: TOC RN made referral to [place] with [facility] for home ehalth on 09/15/18.  This RNCM was CC'd on an email from [place] with [facility] home health on 09/16/18 stating that "they will not be able to provide Quality Charity care to this patient due to need for higher level of care".  This was never documented in patient chart. Patient is back in the hospital currently.  I have reached out back out to [facility] liaison to see if there was any other resources she was working on. I have added CSW to interdisciplinary plan.  Covering RNCM is now attempting to find a charity home health agency to accept patient.     Expected Discharge Plan: Home/Self Care Barriers to Discharge: Continued Medical Work up  Expected Discharge Plan and Services Expected Discharge Plan: Home/Self Care   Discharge Planning Services: CM Consult Post Acute Care Choice: Home Health Living arrangements for the past 2 months: Single Family Home Expected Discharge Date: 09/20/18               DME Arranged: Wheelchair manual DME Agency: [facility] Date DME Agency Contacted: 09/20/18 Time DME Agency Contacted: 1047 Representative spoke with at DME Agency: Nida Boatman HH Arranged: PT HH Agency: [facility] Date HH Agency Contacted: 09/20/18 Time HH Agency Contacted: [phone] Representative spoke with at [facility] Agency: Barbara Cower   Social Determinants of Health (SDOH) Interventions    Readmission Risk Interventions Readmission Risk Prevention Plan 09/20/2018 09/15/2018 08/19/2018  Transportation Screening Complete Complete Complete  Medication Review Oceanographer) Complete Complete Complete  PCP or Specialist appointment within 3-5 days of discharge -  Patient refused Complete  HRI or Home Care Consult - Patient refused Complete  SW Recovery Care/Counseling Consult - Complete Complete  Palliative Care Screening - Not Applicable Not Applicable  Skilled Nursing Facility - Not Applicable Not Applicable  Some recent data might be hidden

## 2018-09-20 NOTE — TOC Progression Note (Signed)
Transition of Care [facility]) - Progression Note    Patient Details  Name: Kurt Horton MRN: [id number] Date of Birth: May 18, 1960  Transition [facility] Care [facility]) CM/SW Contact  Barrie Dunker, RN Phone Number: 09/20/2018, 4:21 PM  Clinical Narrative:     Patient did not want to wait on the taxi so he called an [facility] and paid for it out of his pocket.  Expected Discharge Plan: Home/Self Care Barriers to Discharge: Continued Medical Work up  Expected Discharge Plan and Services Expected Discharge Plan: Home/Self Care   Discharge Planning Services: CM Consult Post Acute Care Choice: Home Health Living arrangements for the past 2 months: Single Family Home Expected Discharge Date: 09/20/18               DME Arranged: Wheelchair manual DME Agency: [facility] Date DME Agency Contacted: 09/20/18 Time DME Agency Contacted: 1047 Representative spoke with at DME Agency: Nida Boatman HH Arranged: PT HH Agency: [facility] Date HH Agency Contacted: 09/20/18 Time HH Agency Contacted: [phone] Representative spoke with at [facility] Agency: Barbara Cower   Social Determinants of Health (SDOH) Interventions    Readmission Risk Interventions Readmission Risk Prevention Plan 09/20/2018 09/15/2018 08/19/2018  Transportation Screening Complete Complete Complete  Medication Review Oceanographer) Complete Complete Complete  PCP or Specialist appointment within 3-5 days of discharge - Patient refused Complete  HRI or Home Care Consult - Patient refused Complete  SW Recovery Care/Counseling Consult - Complete Complete  Palliative Care Screening - Not Applicable Not Applicable  Skilled Nursing Facility - Not Applicable Not Applicable  Some recent data might be hidden

## 2018-09-20 NOTE — Evaluation (Signed)
Physical Therapy Evaluation Patient Details Name: Kurt Horton MRN: [id number] DOB: Nov 12, 1960 Today's Date: 09/20/2018   History of Present Illness  58 y/o male here after getting labs done at MD office and told to go to the ED.  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   Clinical Impression  Pt anxious t/o PT session and perseverated on how weak he is and has been.  Pt reports that he continues to have frequent falls and struggles to manage at home. He states that he has been sober for 10 days (appears he has been in the ED intoxicated within that timeframe.Marland Kitchen.Marland Kitchen.?) but has continues to be very weak.  Pt with knee buckling during modest ambulation effort with extremely heavy reliance on UEs/walker the entire time.    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 A cane or walker will not resolve the patient's issue with performing activities of daily living. A wheelchair is required/recommended and will allow patient to safely perform daily activities. Patient can safely propel the wheelchair in the home or has a caregiver who can provide assistance.      Follow Up Recommendations Home health PT    Equipment Recommendations  Wheelchair cushion (measurements PT)    Recommendations for Other Services       Precautions / Restrictions Precautions Precautions: Fall Restrictions Weight Bearing Restrictions: No      Mobility  Bed Mobility Overal bed mobility: Modified Independent             General bed mobility comments: Heavy use of bed rail, light assist to shift LEs toward EOB  Transfers Overall transfer level: Needs assistance Equipment used: Rolling walker (2 wheeled) Transfers: Sit to/from Stand Sit to Stand: Min assist         General transfer  comment: very weak with getting to standing, heavy UE and then AD use, leaning back of LEs on bed  Ambulation/Gait Ambulation/Gait assistance: Min assist Gait Distance (Feet): 33 Feet Assistive device: Rolling walker (2 wheeled)       General Gait Details: Pt hesitant t/o the effort and highly reliant on UEs/walker.  He had gradually slowing cadence and inability to maintain TKE.  Pt started to have progressive buckling in knees with associated UE fatigue and ultimately needed to sit relatively quickly as he could not tolerate standing (much less walking) any further.  Stairs            Wheelchair Mobility    Modified Rankin (Stroke Patients Only)       Balance Overall balance assessment: Needs assistance Sitting-balance support: No upper extremity supported;Feet supported Sitting balance-Leahy Scale: Good Sitting balance - Comments: Pt able to maintain sitting balance w/o assist and no overt safety issues     Standing balance-Leahy Scale: Fair Standing balance comment: Pt with extremely heavy reliance on the walker/UEs, poor confidence and buckling with even minimal exertion                             Pertinent Vitals/Pain Pain Assessment: 0-10 Pain Score: 8  Pain Location: chronic back pain, chronic b/l LE neuropathy    Home Living Family/patient expects to be discharged to:: Private residence Living Arrangements: Parent Available Help at Discharge: Family;Available 24 hours/day Type  of Home: House Home Access: Stairs to enter Entrance Stairs-Rails: None(states back door is a sliding door threshold only) Entrance Stairs-Number of Steps: 3 Home Layout: One level Home Equipment: Walker - 2 wheels;Cane - single point      Prior Function Level of Independence: Independent with assistive device(s)         Comments: Frequent falls. Does not drive, occasionally goes with father to grocery store     Hand Dominance        Extremity/Trunk  Assessment   Upper Extremity Assessment Upper Extremity Assessment: Generalized weakness    Lower Extremity Assessment Lower Extremity Assessment: Generalized weakness RLE Deficits / Details: grossly 4-/5 t/o except limited AROM in ankle LLE Deficits / Details: grossly 4-/5 t/o except limited AROM in ankle       Communication   Communication: No difficulties  Cognition Arousal/Alertness: Awake/alert Behavior During Therapy: Anxious Overall Cognitive Status: Within Functional Limits for tasks assessed                                        General Comments      Exercises     Assessment/Plan    PT Assessment Patient needs continued PT services  PT Problem List Decreased strength;Decreased range of motion;Decreased activity tolerance;Decreased balance;Decreased mobility;Decreased coordination;Decreased knowledge of use of DME;Decreased safety awareness;Pain       PT Treatment Interventions DME instruction;Gait training;Stair training;Therapeutic activities;Functional mobility training;Therapeutic exercise;Balance training;Neuromuscular re-education;Patient/family education    PT Goals (Current goals can be found in the Care Plan section)  Acute Rehab PT Goals Patient Stated Goal: Pt talking about detox rehab in [place] PT Goal Formulation: With patient Time For Goal Achievement: 2018-10-15 Potential to Achieve Goals: Fair    Frequency Min 2X/week   Barriers to discharge Decreased caregiver support      Co-evaluation               AM-PAC PT "6 Clicks" Mobility  Outcome Measure Help needed turning from your back to your side while in a flat bed without using bedrails?: None Help needed moving from lying on your back to sitting on the side of a flat bed without using bedrails?: None Help needed moving to and from a bed to a chair (including a wheelchair)?: A Little Help needed standing up from a chair using your arms (e.g., wheelchair or  bedside chair)?: A Little Help needed to walk in hospital room?: A Lot Help needed climbing 3-5 steps with a railing? : Total 6 Click Score: 17    End of Session Equipment Utilized During Treatment: Gait belt Activity Tolerance: Patient tolerated treatment well;Patient limited by fatigue Patient left: with call bell/phone within reach;with chair alarm set Nurse Communication: Mobility status PT Visit Diagnosis: Muscle weakness (generalized) (M62.81);Repeated falls (R29.6);Unsteadiness on feet (R26.81);Other abnormalities of gait and mobility (R26.89);History of falling (Z91.81);Pain Pain - part of body: (lumbago)    Time: 1010-1043 PT Time Calculation (min) (ACUTE ONLY): 33 min   Charges:   PT Evaluation $PT Eval Low Complexity: 1 Low PT Treatments $Gait Training: 8-22 mins        Malachi Pro, DPT 09/20/2018, 1:49 PM

## 2018-09-20 NOTE — TOC Transition Note (Signed)
Transition of Care [facility]) - CM/SW Discharge Note   Patient Details  Name: Kurt Horton MRN: [id number] Date of Birth: [birth year]  Transition of Care [facility] CM/SW Contact:  Barrie Dunker, RN Phone Number: 09/20/2018, 9:47 AM   Clinical Narrative:     Patient to discharge home today, will get a taxi voucher for transportation, he lives at home with his parents, I notified Barbara Cower with [facility] of the need for charity Home health services Patient has been provided a walker in a recent previous admission and unable to get another, he does have a cane in the room    Barriers to Discharge: Continued Medical Work up   Patient Goals and CMS Choice Patient states their goals for this hospitalization and ongoing recovery are:: get his kidneys flushed and later walk CMS Medicare.gov Compare Post Acute Care list provided to:: Patient    Discharge Placement                       Discharge Plan and Services   Discharge Planning Services: CM Consult Post Acute Care Choice: Home Health                    HH Arranged: PT [facility] Agency: [facility] Date HH Agency Contacted: 09/20/18 Time HH Agency Contacted: [phone] Representative spoke with at [facility] Agency: Barbara Cower  Social Determinants of Health (SDOH) Interventions     Readmission Risk Interventions Readmission Risk Prevention Plan 09/20/2018 09/15/2018 08/19/2018  Transportation Screening Complete Complete Complete  Medication Review Oceanographer) Complete Complete Complete  PCP or Specialist appointment within 3-5 days of discharge - Patient refused Complete  HRI or Home Care Consult - Patient refused Complete  SW Recovery Care/Counseling Consult - Complete Complete  Palliative Care Screening - Not Applicable Not Applicable  Skilled Nursing Facility - Not Applicable Not Applicable  Some recent data might be hidden

## 2018-09-20 NOTE — TOC Progression Note (Signed)
Transition of Care [facility]) - Progression Note    Patient Details  Name: Shashank Barranco MRN: [id number] Date of Birth: [birth year]  Transition of Care [facility]) CM/SW Contact  Barrie Dunker, RN Phone Number: 09/20/2018, 11:34 AM  Clinical Narrative:    Sent referral to Medication Management and faxed a paper copy of RX to Medication management to help with medications   Expected Discharge Plan: Home/Self Care Barriers to Discharge: Continued Medical Work up  Expected Discharge Plan and Services Expected Discharge Plan: Home/Self Care   Discharge Planning Services: CM Consult Post Acute Care Choice: Home Health Living arrangements for the past 2 months: Single Family Home Expected Discharge Date: 09/20/18               DME Arranged: Wheelchair manual DME Agency: [facility] Date DME Agency Contacted: 09/20/18 Time DME Agency Contacted: [phone] Representative spoke with at DME Agency: Nida Boatman HH Arranged: PT HH Agency: [facility] Date HH Agency Contacted: 09/20/18 Time HH Agency Contacted: [phone] Representative spoke with at [facility] Agency: Barbara Cower   Social Determinants of Health (SDOH) Interventions    Readmission Risk Interventions Readmission Risk Prevention Plan 09/20/2018 09/15/2018 08/19/2018  Transportation Screening Complete Complete Complete  Medication Review Oceanographer) Complete Complete Complete  PCP or Specialist appointment within 3-5 days of discharge - Patient refused Complete  HRI or Home Care Consult - Patient refused Complete  SW Recovery Care/Counseling Consult - Complete Complete  Palliative Care Screening - Not Applicable Not Applicable  Skilled Nursing Facility - Not Applicable Not Applicable  Some recent data might be hidden

## 2018-09-21 NOTE — Discharge Summary (Signed)
[facility] [place] at [facility]   PATIENT NAME: Kurt Horton    MR#:  [id number]  DATE OF BIRTH:  26-Sep-1960  DATE OF ADMISSION:  09/18/2018   ADMITTING PHYSICIAN: Oralia Manis, MD  DATE OF DISCHARGE: 09/20/2018  4:06 PM  PRIMARY CARE PHYSICIAN: [name], MD   ADMISSION DIAGNOSIS:   Hypomagnesemia [E83.42] Weakness [R53.1] Acute kidney injury (HCC) [N17.9] Traumatic rhabdomyolysis, initial encounter (HCC) [T79.6XXA]  DISCHARGE DIAGNOSIS:   Principal Problem:   Rhabdomyolysis Active Problems:   Type II diabetes mellitus with manifestations (HCC)   HTN (hypertension)   Alcohol use disorder, severe, dependence (HCC)   Chronic combined systolic and diastolic CHF (congestive heart failure) (HCC)   PAF (paroxysmal atrial fibrillation) (HCC)   AKI (acute kidney injury) (HCC)   SECONDARY DIAGNOSIS:   Past Medical History:  Diagnosis Date  . A-fib (HCC)   . Alcohol abuse   . Alcohol withdrawal (HCC) 11/12/2016  . Diabetes mellitus without complication (HCC)   . DJD (degenerative joint disease)   . Hypertension   . Myasthenia gravis (HCC)   . Renal disorder     HOSPITAL COURSE:   58 year old male with past medical history significant for alcohol abuse and multiple admissions and ER visits within the last 3 months, diabetes, hypertension, A. fib not on anticoagulation presents to hospital secondary to weakness and falls.  Patient has had several hospitalizations and ER visits for alcohol intoxication and weakness and falls.  1.  Rhabdomyolysis-concern for relation to excessive alcohol abuse.  Still has  tremors. -Urine analysis with no myoglobinuria.  Received IV fluids and  drop in CPK noted. -Still has low vitamin B12 and vitamin D levels.  They have been appropriately replaced and prescribed at discharge.  2.  Acute kidney injury-secondary to prerenal causes.  Improved with IV fluids  3.  Alcohol abuse disorder-severe dependence.   Patient states he has not had a drink in 9 days.  Was planning to go into the inpatient alcohol rehab program.  According to social worker, patient has mentioned the same during every admission and goes back into drinking.  Offered information about outpatient alcohol programs.  Patient refused at this time  4.  Paroxysmal atrial fibrillation-rate controlled.  Not a good candidate for anticoagulation given his drinking and falls. -Monitor for now.  Patient ambulated with physical therapy.  He was very anxious.  He suffers from chronic weakness.  Physical therapy recommended wheelchair to prevent falls.  Patient stays at home with his parents.  DISCHARGE CONDITIONS:   Guarded  CONSULTS OBTAINED:   none  DRUG ALLERGIES:   Allergies  Allergen Reactions  . Nsaids Other (See Comments)    Duodenal ulcers, GI bleeding  . Other Other (See Comments)    Patient states he can't take any "mycin" medications because myasthenia gravis.   DISCHARGE MEDICATIONS:   Allergies as of 09/20/2018      Reactions   Nsaids Other (See Comments)   Duodenal ulcers, GI bleeding   Other Other (See Comments)   Patient states he can't take any "mycin" medications because myasthenia gravis.      Medication List    STOP taking these medications   cloNIDine 0.1 mg/24hr patch Commonly known as:  CATAPRES - Dosed in mg/24 hr   diazepam 2 MG tablet Commonly known as:  VALIUM   diltiazem 120 MG 24 hr capsule Commonly known as:  CARDIZEM CD   guaiFENesin-dextromethorphan 100-10 MG/5ML syrup Commonly known as:  ROBITUSSIN DM  hydrOXYzine 25 MG tablet Commonly known as:  ATARAX/VISTARIL   ibuprofen 800 MG tablet Commonly known as:  ADVIL   magnesium oxide 400 MG tablet Commonly known as:  MAG-OX   metFORMIN 500 MG tablet Commonly known as:  GLUCOPHAGE   metoprolol succinate 50 MG 24 hr tablet Commonly known as:  TOPROL-XL   nicotine 14 mg/24hr patch Commonly known as:  NICODERM CQ - dosed in  mg/24 hours   senna-docusate 8.6-50 MG tablet Commonly known as:  Senokot-S   testosterone cypionate 200 MG/ML injection Commonly known as:  DEPOTESTOSTERONE CYPIONATE     TAKE these medications   folic acid 1 MG tablet Commonly known as:  FOLVITE Take 1 tablet (1 mg total) by mouth daily for 30 days.   gabapentin 300 MG capsule Commonly known as:  NEURONTIN Take 1 capsule (300 mg total) by mouth 3 (three) times daily.   multivitamin with minerals Tabs tablet Take 1 tablet by mouth daily.   Oxycodone HCl 10 MG Tabs Take 10 mg by mouth daily as needed for pain.   thiamine 100 MG tablet Take 1 tablet (100 mg total) by mouth daily for 30 days.   traZODone 100 MG tablet Commonly known as:  DESYREL Take 1 tablet (100 mg total) by mouth at bedtime.   vitamin B-12 250 MCG tablet Commonly known as:  CYANOCOBALAMIN Take 1 tablet (250 mcg total) by mouth daily.   Vitamin D (Ergocalciferol) 1.25 MG (50000 UT) Caps capsule Commonly known as:  DRISDOL Take 1 capsule (50,000 Units total) by mouth every 7 (seven) days. Take on Fridays Start taking on:  Sep 27, 2018        DISCHARGE INSTRUCTIONS:   1.  PCP follow-up in 1 week  DIET:   Cardiac diet  ACTIVITY:   Activity as tolerated  OXYGEN:   Home Oxygen: No.  Oxygen Delivery: room air  DISCHARGE LOCATION:   home   If you experience worsening of your admission symptoms, develop shortness of breath, life threatening emergency, suicidal or homicidal thoughts you must seek medical attention immediately by calling 911 or calling your MD immediately  if symptoms less severe.  You Must read complete instructions/literature along with all the possible adverse reactions/side effects for all the Medicines you take and that have been prescribed to you. Take any new Medicines after you have completely understood and accpet all the possible adverse reactions/side effects.   Please note  You were cared for by a hospitalist  during your hospital stay. If you have any questions about your discharge medications or the care you received while you were in the hospital after you are discharged, you can call the unit and asked to speak with the hospitalist on call if the hospitalist that took care of you is not available. Once you are discharged, your primary care physician will handle any further medical issues. Please note that NO REFILLS for any discharge medications will be authorized once you are discharged, as it is imperative that you return to your primary care physician (or establish a relationship with a primary care physician if you do not have one) for your aftercare needs so that they can reassess your need for medications and monitor your lab values.    On the day of Discharge:  VITAL SIGNS:   Blood pressure (!) 151/86, pulse 84, temperature 98.3 F (36.8 C), temperature source Oral, resp. rate 18, height  (1.803 m), weight 83.9 kg, SpO2 100 %.  PHYSICAL EXAMINATION:  GENERAL:  58 y.o.-year-old patient lying in the bed with no acute distress.  EYES: Pupils equal, round, reactive to light and accommodation. No scleral icterus. Extraocular muscles intact.  HEENT: Head atraumatic, normocephalic. Oropharynx and nasopharynx clear.  NECK:  Supple, no jugular venous distention. No thyroid enlargement, no tenderness.  LUNGS: Normal breath sounds bilaterally, no wheezing, rales,rhonchi or crepitation. No use of accessory muscles of respiration.  Decreased bibasilar breath sounds noted CARDIOVASCULAR: S1, S2 normal. No murmurs, rubs, or gallops.  ABDOMEN: Soft, nontender, nondistended. Bowel sounds present. No organomegaly or mass.  EXTREMITIES: No  cyanosis, or clubbing. 1+ lower extremity edema noted. -Tremors of upper extremity noted NEUROLOGIC: Cranial nerves II through XII are intact. Muscle strength 5/5 in all extremities. Sensation intact. Gait not checked. Global weakness- much weaker in lower  extremities PSYCHIATRIC: The patient is alert and oriented x 3.  SKIN: No obvious rash, lesion, or ulcer.    DATA REVIEW:   CBC Recent Labs  Lab 09/19/18 0433  WBC 7.6  HGB 10.5*  HCT 33.6*  PLT 235    Chemistries  Recent Labs  Lab 09/15/18 0453  09/20/18 0544  NA 137   < > 137  K 3.4*   < > 4.2  CL 101   < > 108  CO2 27   < > 24  GLUCOSE 114*   < > 129*  BUN 9   < > 14  CREATININE 0.76   < > 0.78  CALCIUM 9.1   < > 8.1*  MG 1.9  --   --    < > = values in this interval not displayed.     Microbiology Results  Results for orders placed or performed during the hospital encounter of 09/14/18  SARS Coronavirus 2 [facility] order, Performed in [facility] hospital lab)     Status: None   Collection Time: 09/14/18  1:01 PM  Result Value Ref Range Status   SARS Coronavirus 2 NEGATIVE NEGATIVE Final    Comment: (NOTE) If result is NEGATIVE SARS-CoV-2 target nucleic acids are NOT DETECTED. The SARS-CoV-2 RNA is generally detectable in upper and lower  respiratory specimens during the acute phase of infection. The lowest  concentration of SARS-CoV-2 viral copies this assay can detect is 250  copies / mL. A negative result does not preclude SARS-CoV-2 infection  and should not be used as the sole basis for treatment or other  patient management decisions.  A negative result may occur with  improper specimen collection / handling, submission of specimen other  than nasopharyngeal swab, presence of viral mutation(s) within the  areas targeted by this assay, and inadequate number of viral copies  (<250 copies / mL). A negative result must be combined with clinical  observations, patient history, and epidemiological information. If result is POSITIVE SARS-CoV-2 target nucleic acids are DETECTED. The SARS-CoV-2 RNA is generally detectable in upper and lower  respiratory specimens dur ing the acute phase of infection.  Positive  results are indicative of active infection  with SARS-CoV-2.  Clinical  correlation with patient history and other diagnostic information is  necessary to determine patient infection status.  Positive results do  not rule out bacterial infection or co-infection with other viruses. If result is PRESUMPTIVE POSTIVE SARS-CoV-2 nucleic acids MAY BE PRESENT.   A presumptive positive result was obtained on the submitted specimen  and confirmed on repeat testing.  While 2019 novel coronavirus  (SARS-CoV-2) nucleic acids may be present in the submitted sample  additional confirmatory testing may be necessary for epidemiological  and / or clinical management purposes  to differentiate between  SARS-CoV-2 and other Sarbecovirus currently known to infect humans.  If clinically indicated additional testing with an alternate test  methodology [phone]) is advised. The SARS-CoV-2 RNA is generally  detectable in upper and lower respiratory sp ecimens during the acute  phase of infection. The expected result is Negative. Fact Sheet for Patients:  BoilerBrush.com.cy Fact Sheet for Healthcare Providers: https://pope.com/ This test is not yet approved or cleared by the [place] FDA and has been authorized for detection and/or diagnosis of SARS-CoV-2 by FDA under an Emergency Use Authorization (EUA).  This EUA will remain in effect (meaning this test can be used) for the duration of the COVID-19 declaration under Section 564(b)(1) of the Act, 21 U.S.C. section 360bbb-3(b)(1), unless the authorization is terminated or revoked sooner. Performed at [facility], [address], [place], [place] [address]     RADIOLOGY:  No results found.   Management plans discussed with the patient, family and they are in agreement.  CODE STATUS:  Code Status History    Date Active Date Inactive Code Status Order ID Comments User Context   09/18/2018 2218 09/20/2018 1928 Full Code [id number]   Oralia Manis, MD Inpatient   09/14/2018 1557 09/15/2018 1908 Full Code [id number]  Altamese Dilling, MD Inpatient   08/26/2018 1055 08/28/2018 1423 Full Code [id number]  Milagros Loll, MD ED   08/26/2018 [phone] 08/26/2018 1055 Full Code [id number]  Arnaldo Natal, MD ED   08/17/2018 1924 08/20/2018 1838 Full Code [id number]  Milagros Loll, MD ED   06/24/2018 1438 06/28/2018 2141 Full Code [id number]  Altamese Dilling, MD ED   06/24/2018 1207 06/24/2018 1438 Full Code [id number]  Arnaldo Natal, MD ED   06/12/2018 0350 06/13/2018 0101 Full Code [id number]  Darci Current, MD ED   05/25/2018 1609 05/30/2018 1913 Full Code [id number]  Luciano Cutter, DO Inpatient   05/17/2018 0121 05/21/2018 1635 Full Code [id number]  Mayo, Allyn Kenner, MD Inpatient   04/15/2018 1145 04/19/2018 2132 Full Code [id number]  Delfino Lovett, MD Inpatient   04/03/2018 0547 04/10/2018 2052 Full Code [id number]  Barbaraann Rondo, MD ED   02/04/2018 1528 02/07/2018 1728 Full Code [id number]  Mayo, Allyn Kenner, MD ED   02/03/2018 2233 02/04/2018 0841 Full Code [id number]  Jene Every, MD ED   01/20/2018 0140 01/22/2018 1819 Full Code [id number]  Barbaraann Rondo, MD ED   01/19/2018 1239 01/20/2018 0140 Full Code [id number]  Myrna Blazer, MD ED   12/22/2017 0841 12/25/2017 2031 Full Code [id number]  Barbaraann Rondo, MD Inpatient   10/27/2017 1522 11/02/2017 1432 Full Code [id number]  Milagros Loll, MD ED   10/21/2017 1945 10/23/2017 1833 Full Code [id number]  Auburn Bilberry, MD Inpatient   10/21/2017 1504 10/21/2017 1945 Full Code [id number]  Arnaldo Natal, MD ED   10/10/2017 1314 10/11/2017 1147 Full Code [id number]  Willy Eddy, MD ED   10/05/2017 2214 10/07/2017 2101 Full Code [id number]  Arnaldo Natal, MD ED   10/05/2017 1924 10/05/2017 2214 Full Code [id number]  Arnaldo Natal, MD ED   10/01/2017 0807 10/04/2017 1650 Full Code [id number]  Barbaraann Rondo, MD Inpatient   08/24/2017 1756 08/27/2017 1849 Full Code [id number]  Enid Baas, MD ED   08/14/2017 0030 08/18/2017 1605 Full Code [id number]  Oralia Manis, MD Inpatient   06/04/2017 0116 06/06/2017 1701 Full Code [id number]  Oralia Manis, MD Inpatient   03/19/2017  1559 03/20/2017 2128 Full Code [id number]  Arnaldo NatalMalinda, Paul F, MD ED   02/25/2017 1421 03/01/2017 1829 Full Code [id number]  Houston SirenSainani, Vivek J, MD Inpatient   11/12/2016 1533 11/14/2016 1425 Full Code [id number]  Marguarite ArbourSparks, Jeffrey D, MD Inpatient   12/26/2015 1927 12/30/2015 1604 Full Code [id number]  Marguarite ArbourSparks, Jeffrey D, MD Inpatient      TOTAL TIME TAKING CARE OF THIS PATIENT: 38 minutes.    Enid Baasadhika Shahira Fiske M.D on 09/21/2018 at 12:55 PM  Between 7am to 6pm - Pager - [phone]  After 6pm go to [email or web address] - Social research officer, governmentpassword [email or web address] [facility]  [facility]  Office  [phone]  CC: Primary care physician; Barbette ReichmannHande, Vishwanath, MD   Note: This dictation was prepared with Dragon dictation along with smaller phrase technology. Any transcriptional errors that result from this process are unintentional.

## 2018-09-29 ENCOUNTER — Other Ambulatory Visit: Payer: Self-pay

## 2018-09-29 ENCOUNTER — Emergency Department
Admission: EM | Admit: 2018-09-29 | Discharge: 2018-09-29 | Disposition: A | Discharge status: Home / Self Care | Payer: Medicare Other | Attending: Emergency Medicine | Admitting: Emergency Medicine

## 2018-09-29 ENCOUNTER — Emergency Department: Payer: Medicare Other

## 2018-09-29 DIAGNOSIS — I110 Hypertensive heart disease with heart failure: Secondary | ICD-10-CM | POA: Insufficient documentation

## 2018-09-29 DIAGNOSIS — E119 Type 2 diabetes mellitus without complications: Secondary | ICD-10-CM | POA: Insufficient documentation

## 2018-09-29 DIAGNOSIS — Z79899 Other long term (current) drug therapy: Secondary | ICD-10-CM | POA: Diagnosis not present

## 2018-09-29 DIAGNOSIS — F10920 Alcohol use, unspecified with intoxication, uncomplicated: Secondary | ICD-10-CM | POA: Insufficient documentation

## 2018-09-29 DIAGNOSIS — F17210 Nicotine dependence, cigarettes, uncomplicated: Secondary | ICD-10-CM | POA: Insufficient documentation

## 2018-09-29 DIAGNOSIS — I5042 Chronic combined systolic (congestive) and diastolic (congestive) heart failure: Secondary | ICD-10-CM | POA: Insufficient documentation

## 2018-09-29 LAB — COMPREHENSIVE METABOLIC PANEL
ALT: 17 U/L (ref 0–44)
AST: 19 U/L (ref 15–41)
Albumin: 3.6 g/dL (ref 3.5–5.0)
Alkaline Phosphatase: 80 U/L (ref 38–126)
Anion gap: 12 (ref 5–15)
BUN: 5 mg/dL — ABNORMAL LOW (ref 6–20)
CO2: 24 mmol/L (ref 22–32)
Calcium: 8.4 mg/dL — ABNORMAL LOW (ref 8.9–10.3)
Chloride: 102 mmol/L (ref 98–111)
Creatinine, Ser: 0.68 mg/dL (ref 0.61–1.24)
GFR calc Af Amer: >60 mL/min (ref >60)
GFR calc non Af Amer: >60 mL/min (ref >60)
Glucose, Bld: 157 mg/dL — ABNORMAL HIGH (ref 70–99)
Potassium: 3.6 mmol/L (ref 3.5–5.1)
Sodium: 138 mmol/L (ref 135–145)
Total Bilirubin: 0.4 mg/dL (ref 0.3–1.2)
Total Protein: 6.4 g/dL — ABNORMAL LOW (ref 6.5–8.1)

## 2018-09-29 LAB — TROPONIN I: Troponin I: <0.03 ng/mL (ref <0.03)

## 2018-09-29 LAB — CBC WITH DIFFERENTIAL/PLATELET
Abs Immature Granulocytes: 0.03 10*3/uL (ref 0.00–0.07)
Basophils Absolute: 0.1 10*3/uL (ref 0.0–0.1)
Basophils Relative: 2 %
Eosinophils Absolute: 0 10*3/uL (ref 0.0–0.5)
Eosinophils Relative: 0 %
HCT: 34.2 % — ABNORMAL LOW (ref 39.0–52.0)
Hemoglobin: 10.9 g/dL — ABNORMAL LOW (ref 13.0–17.0)
Immature Granulocytes: 1 %
Lymphocytes Relative: 31 %
Lymphs Abs: 1.8 10*3/uL (ref 0.7–4.0)
MCH: 28.7 pg (ref 26.0–34.0)
MCHC: 31.9 g/dL (ref 30.0–36.0)
MCV: 90 fL (ref 80.0–100.0)
Monocytes Absolute: 0.5 10*3/uL (ref 0.1–1.0)
Monocytes Relative: 9 %
Neutro Abs: 3.3 10*3/uL (ref 1.7–7.7)
Neutrophils Relative %: 57 %
Platelets: 359 10*3/uL (ref 150–400)
RBC: 3.8 MIL/uL — ABNORMAL LOW (ref 4.22–5.81)
RDW: 18.7 % — ABNORMAL HIGH (ref 11.5–15.5)
WBC: 5.8 10*3/uL (ref 4.0–10.5)
nRBC: 0 % (ref 0.0–0.2)

## 2018-09-29 LAB — ETHANOL: Alcohol, Ethyl (B): 271 mg/dL — ABNORMAL HIGH (ref <10)

## 2018-09-29 LAB — ACETAMINOPHEN LEVEL: Acetaminophen (Tylenol), Serum: <10 ug/mL — ABNORMAL LOW (ref 10–30)

## 2018-09-29 MED ORDER — CHLORDIAZEPOXIDE HCL 25 MG PO CAPS
25.0000 mg | ORAL_CAPSULE | Freq: Once | ORAL | Status: AC
  Administered 2018-09-29: 25 mg via ORAL
  Filled 2018-09-29: qty 1

## 2018-09-29 MED ORDER — CHLORDIAZEPOXIDE HCL 25 MG PO CAPS: ORAL_CAPSULE | ORAL | 0 refills | Status: AC

## 2018-09-29 NOTE — BH Assessment (Signed)
Patient's information was faxed to the following facilities for detox treatment:   [facility]     [facility]     [facility]     [facility]

## 2018-09-29 NOTE — ED Provider Notes (Signed)
[facility] Emergency Department Provider Note   ____________________________________________   First MD Initiated Contact with Patient 09/29/18 1144     (approximate)  I have reviewed the triage vital signs and the nursing notes.   HISTORY  Chief Complaint Alcohol Intoxication    HPI Kurt Horton is a 58 y.o. male who says he wants help getting off of alcohol.  He just finished a 12 pack of beers.  He has not had anything else today he says.  Denies any other problems currently.         Past Medical History:  Diagnosis Date  . A-fib (HCC)   . Alcohol abuse   . Alcohol withdrawal (HCC) 11/12/2016  . Diabetes mellitus without complication (HCC)   . DJD (degenerative joint disease)   . Hypertension   . Myasthenia gravis (HCC)   . Renal disorder     Patient Active Problem List   Diagnosis Date Noted  . Rhabdomyolysis 09/18/2018  . AKI (acute kidney injury) (HCC) 09/18/2018  . Alcohol withdrawal (HCC) 08/26/2018  . PAF (paroxysmal atrial fibrillation) (HCC) 08/17/2018  . Tobacco use disorder 05/27/2018  . GI bleed 02/04/2018  . Alcohol intoxication (HCC) 12/22/2017  . Malnutrition of moderate degree 10/04/2017  . Chronic combined systolic and diastolic CHF (congestive heart failure) (HCC) 08/13/2017  . Alcohol withdrawal delirium (HCC) 02/26/2017  . Atrial fibrillation with RVR (HCC) 02/25/2017  . Acute alcoholic intoxication with complication (HCC)   . Alcohol use disorder, severe, dependence (HCC)   . Substance induced mood disorder (HCC) 11/13/2016  . SVT (supraventricular tachycardia) (HCC) 11/12/2016  . HTN (hypertension) 11/12/2016  . EtOH dependence (HCC) 11/12/2016  . Lumbar radiculopathy 08/24/2016  . Chronic low back pain 01/04/2016  . Pressure ulcer 12/29/2015  . Osteomyelitis (HCC) 12/26/2015  . PVD (peripheral vascular disease) (HCC) 12/26/2015  . Type II diabetes mellitus with manifestations (HCC) 12/26/2015  .  Myasthenia gravis (HCC) 12/26/2015    Past Surgical History:  Procedure Laterality Date  . AMPUTATION TOE Left 12/29/2015   Procedure: AMPUTATION TOE;  Surgeon: Gwyneth Revels, DPM;  Location: [facility] ORS;  Service: Podiatry;  Laterality: Left;  . ESOPHAGOGASTRODUODENOSCOPY N/A 02/05/2018   Procedure: ESOPHAGOGASTRODUODENOSCOPY (EGD);  Surgeon: Pasty Spillers, MD;  Location[facility] ENDOSCOPY;  Service: Endoscopy;  Laterality: N/A;  . PERIPHERAL VASCULAR CATHETERIZATION N/A 12/27/2015   Procedure: Lower Extremity Angiography;  Surgeon: Annice Needy, MD;  Location: [facility] INVASIVE CV LAB;  Service: Cardiovascular;  Laterality: N/A;  . right hip surgery    . right shoulder surgery      Prior to Admission medications   Medication Sig Start Date End Date Taking? Authorizing Provider  gabapentin (NEURONTIN) 300 MG capsule Take 1 capsule (300 mg total) by mouth 3 (three) times daily. 09/20/18   Enid Baas, MD  Multiple Vitamin (MULTIVITAMIN WITH MINERALS) TABS tablet Take 1 tablet by mouth daily. 09/08/18   Shaune Pollack, MD  Oxycodone HCl 10 MG TABS Take 10 mg by mouth daily as needed for pain. 09/17/18 10/02/18  [provider]  thiamine 100 MG tablet Take 1 tablet (100 mg total) by mouth daily for 30 days. 09/20/18 10/20/18  Enid Baas, MD  traZODone (DESYREL) 100 MG tablet Take 1 tablet (100 mg total) by mouth at bedtime. 09/20/18   Enid Baas, MD  vitamin B-12 (CYANOCOBALAMIN) 250 MCG tablet Take 1 tablet (250 mcg total) by mouth daily. 09/21/18   Enid Baas, MD  Vitamin D, Ergocalciferol, (DRISDOL) 1.25 MG (50000  UT) CAPS capsule Take 1 capsule (50,000 Units total) by mouth every 7 (seven) days. Take on Fridays 09/27/18   Enid Baas, MD    Allergies Nsaids and Other  Family History  Problem Relation Age of Onset  . Rheum arthritis Father     Social History Social History   Tobacco Use  . Smoking status: Current Every Day Smoker    Packs/day: 0.50     Types: Cigarettes  . Smokeless tobacco: Never Used  Substance Use Topics  . Alcohol use: Yes    Alcohol/week: 20.0 - 30.0 standard drinks    Types: 20 - 30 Shots of liquor per week    Comment: Last drink 3 days ago 06/22/2018- rum  . Drug use: Yes    Types: Oxycodone    Review of Systems  Constitutional: No fever/chills Eyes: No visual changes. ENT: No sore throat. Cardiovascular: Denies chest pain. Respiratory: Denies shortness of breath. Gastrointestinal: No abdominal pain.  No nausea, no vomiting.  No diarrhea.  No constipation. Genitourinary: Negative for dysuria. Musculoskeletal: Negative for back pain. Skin: Negative for rash. Neurological: Negative for headaches, focal weakness   ____________________________________________   PHYSICAL EXAM:  VITAL SIGNS: ED Triage Vitals  Enc Vitals Group     BP 09/29/18 1150 (!) 154/96     Pulse Rate 09/29/18 1150 (!) 103     Resp --      Temp 09/29/18 1150 97.8 F (36.6 C)     Temp Source 09/29/18 1150 Oral     SpO2 09/29/18 1150 93 %     Weight 09/29/18 1147 191 lb 12.8 oz (87 kg)     Height 09/29/18 1147  (1.803 m)     Head Circumference --      Peak Flow --      Pain Score 09/29/18 1146 9     Pain Loc --      Pain Edu? --      Excl. in GC? --     Constitutional: Alert and oriented.  Appears intoxicated but not in distress Eyes: Conjunctivae are normal.  Head: Atraumatic. Nose: No congestion/rhinnorhea. Mouth/Throat: Mucous membranes are moist.  Oropharynx non-erythematous. Neck: No stridor.  Cardiovascular: Normal rate, regular rhythm. Grossly normal heart sounds.  Good peripheral circulation. Respiratory: Normal respiratory effort.  No retractions. Lungs CTAB. Gastrointestinal: Soft and nontender. No distention. No abdominal bruits. No CVA tenderness. Extremities: No tenderness in extremities.  No edema Neurologic:  Normal speech and language.  Understanding the patient is intoxicated no gross focal  neurologic deficits are appreciated.  Skin:  Skin is warm, dry and intact. No rash noted.   ____________________________________________   LABS (all labs ordered are listed, but only abnormal results are displayed)  Labs Reviewed  COMPREHENSIVE METABOLIC PANEL - Abnormal; Notable for the following components:      Result Value   Glucose, Bld 157 (*)    BUN 5 (*)    Calcium 8.4 (*)    Total Protein 6.4 (*)    All other components within normal limits  ETHANOL - Abnormal; Notable for the following components:   Alcohol, Ethyl (B) 271 (*)    All other components within normal limits  ACETAMINOPHEN LEVEL - Abnormal; Notable for the following components:   Acetaminophen (Tylenol), Serum <10 (*)    All other components within normal limits  CBC WITH DIFFERENTIAL/PLATELET - Abnormal; Notable for the following components:   RBC 3.80 (*)    Hemoglobin 10.9 (*)    HCT 34.2 (*)  RDW 18.7 (*)    All other components within normal limits  SARS CORONAVIRUS 2 (HOSPITAL ORDER, PERFORMED IN [facility])  TROPONIN I  URINALYSIS, COMPLETE (UACMP) WITH MICROSCOPIC  URINE DRUG SCREEN, QUALITATIVE ([facility] ONLY)   ____________________________________________  EKG   ____________________________________________  RADIOLOGY  ED MD interpretation:   Official radiology report(s): Dg Chest Portable 1 View  Result Date: 09/29/2018 CLINICAL DATA:  Hypertension and atrial fibrillation. Ethanol intoxication. EXAM: PORTABLE CHEST 1 VIEW COMPARISON:  Chest radiograph September 05, 2018 and chest CT Sep 10, 2018 FINDINGS: No edema or consolidation. Heart is slightly enlarged with pulmonary vascularity normal. No adenopathy. No bone lesions. IMPRESSION: Mild cardiac enlargement.  No edema or consolidation. Electronically Signed   By: Bretta BangWilliam  Woodruff III M.D.   On: 09/29/2018 12:08    ____________________________________________   PROCEDURES  Procedure(s) performed (including Critical  Care):  Procedures   ____________________________________________   INITIAL IMPRESSION / ASSESSMENT AND PLAN / ED COURSE  Patient went to sleep after I spoke to him.  Waiting for TTS to see if they can talk to him while he is awake and give him some help which he asked for.  [facility] I am informed will not see him any longer.  Because he is been there several times and never fully cooperated with them.             ____________________________________________   FINAL CLINICAL IMPRESSION(S) / ED DIAGNOSES  Final diagnoses:  Alcoholic intoxication without complication [facility](HCC)     ED Discharge Orders    None       Note:  This document was prepared using Dragon voice recognition software and may include unintentional dictation errors.    Arnaldo NatalMalinda, Petro Talent F, MD 09/29/18 1600

## 2018-09-29 NOTE — BH Assessment (Addendum)
This Clinical research associate called [facility] to inquire about bed availability ... Mark (Intake RN - [phone]) informed this Clinical research associate that pt does not meet criteria for admission to their facility due to pt needing a "higher level of care".

## 2018-09-29 NOTE — ED Triage Notes (Signed)
Pt brought by EMS for alcohol detox. Pt called EMS after drinking a 12 pack of beer to be brought to the hospital for detox. Pt reports chronic pain in lower back. Pt answers questions appropriately.

## 2018-09-29 NOTE — ED Provider Notes (Signed)
Patient is in no acute distress, he has been turned down for outpatient detox.  We will try a Librium taper.   Emily Filbert, MD 09/29/18 [phone]

## 2018-10-07 DEATH — deceased

## 2019-04-27 IMAGING — CT CT HEAD W/O CM
3 series · 15 of 47 positions shown, 18 images · non-contrast
Comparison: CT 04/16/2018

CLINICAL DATA: ETOH intoxication, abrasions

EXAM:
CT HEAD WITHOUT CONTRAST
TECHNIQUE: Contiguous axial images were obtained from the base of the skull
through the vertex without intravenous contrast.

[Series 3: head wo · axial · 0.41mm/px · z∈[-104,+26]mm · 9 of 32 slices shown, 12 images]
[im 3/32  brain]
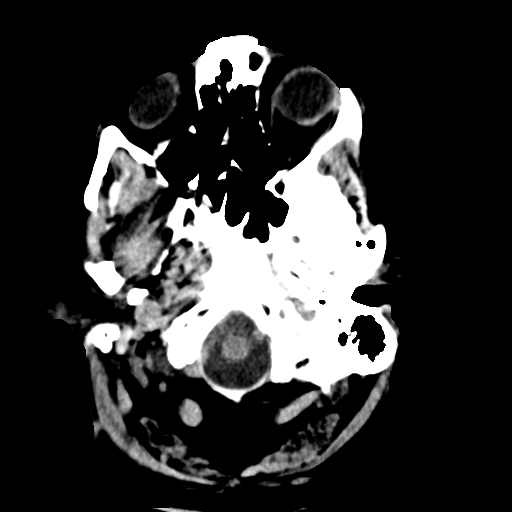
[im 3/32  bone]
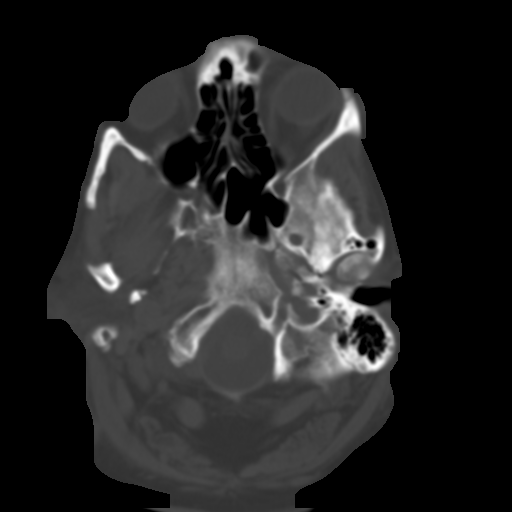
[im 6/32  brain]
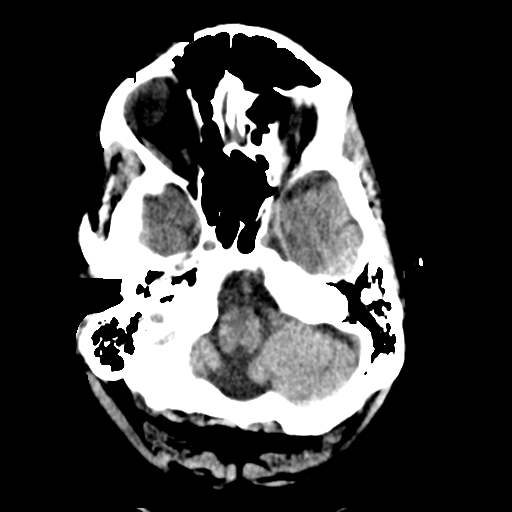
[im 9/32  brain]
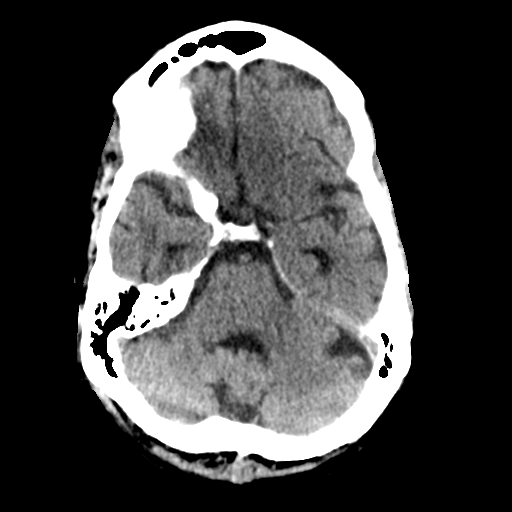
[im 12/32  brain]
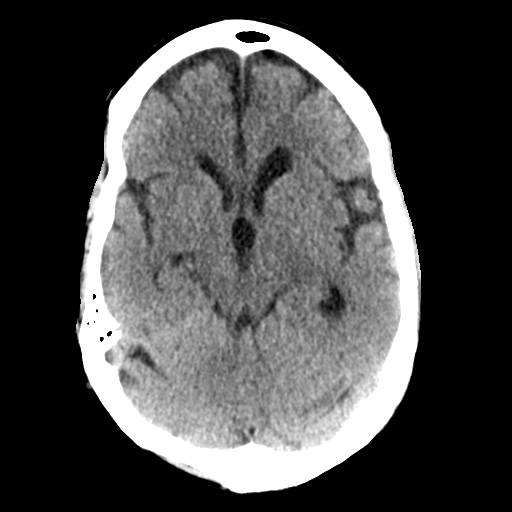
[im 17/32  brain]
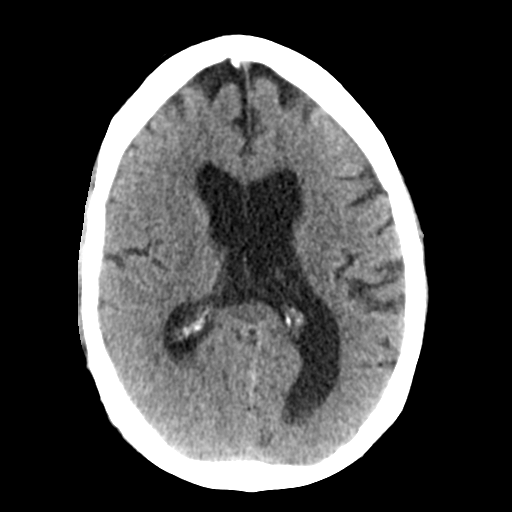
[im 17/32  bone]
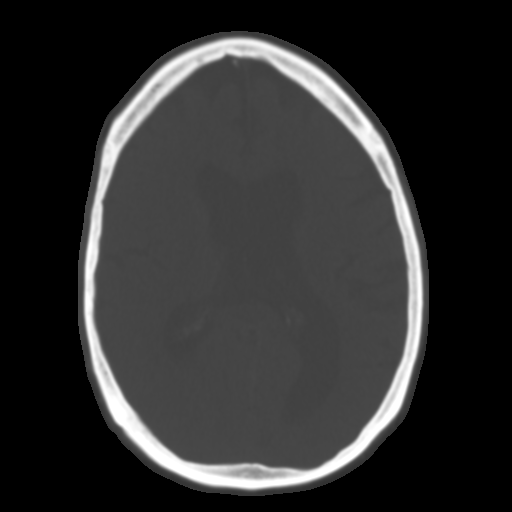
[im 20/32  brain]
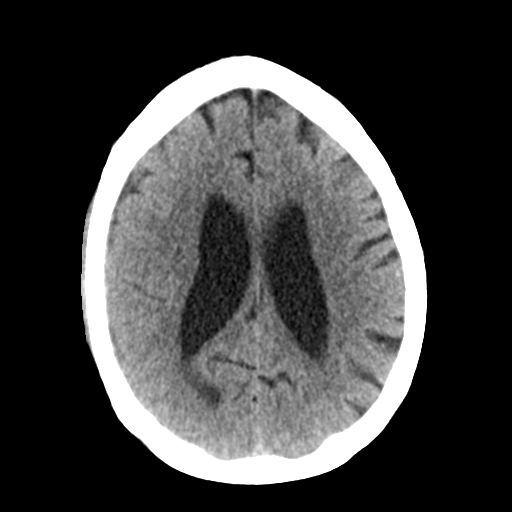
[im 23/32  brain]
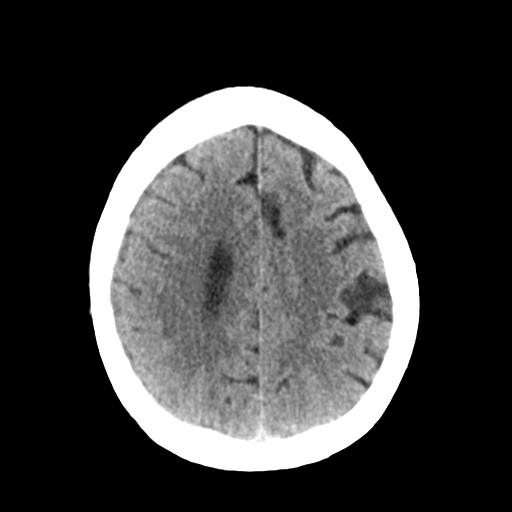
[im 26/32  brain]
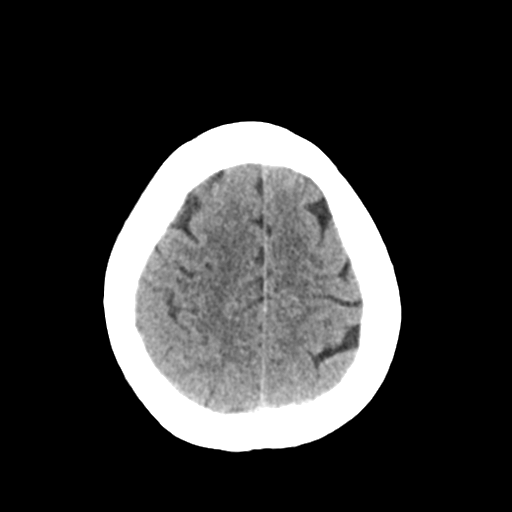
[im 29/32  brain]
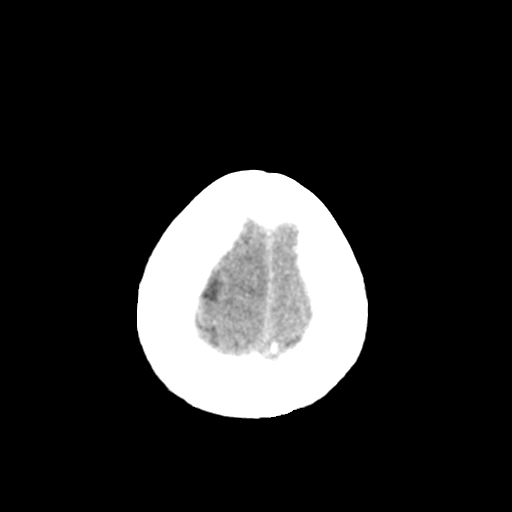
[im 29/32  bone]
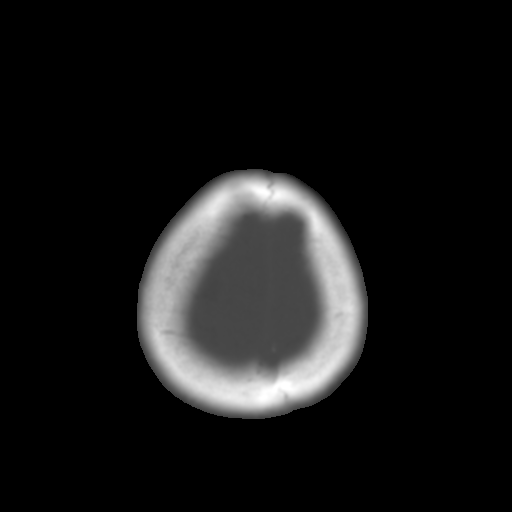

[Series 4: coronal soft tissue · coronal · 0.39mm/px · 3 of 67 slices shown]
[im 23/67  brain]
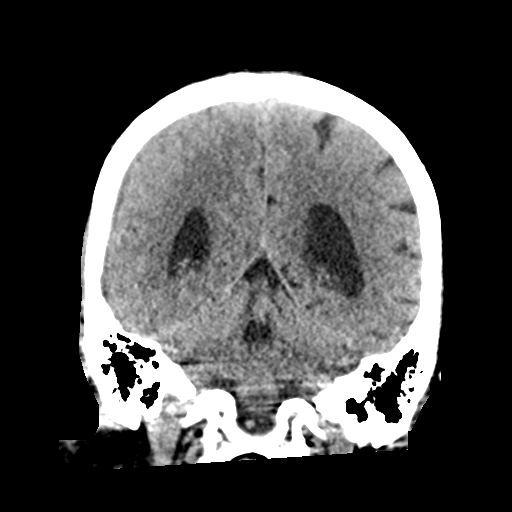
[im 30/67  brain]
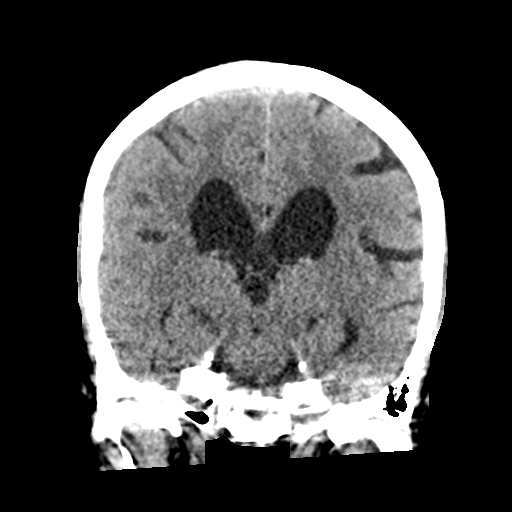
[im 37/67  brain]
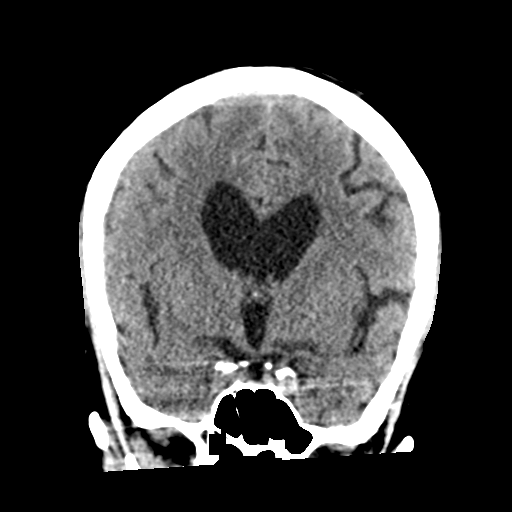

[Series 5: sagittal soft tissue · sagittal · 0.36mm/px · 3 of 67 slices shown]
[im 23/67  brain]
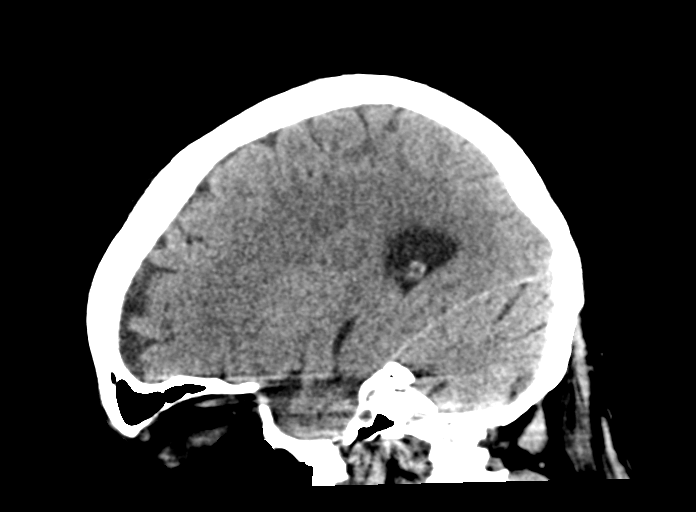
[im 34/67  brain]
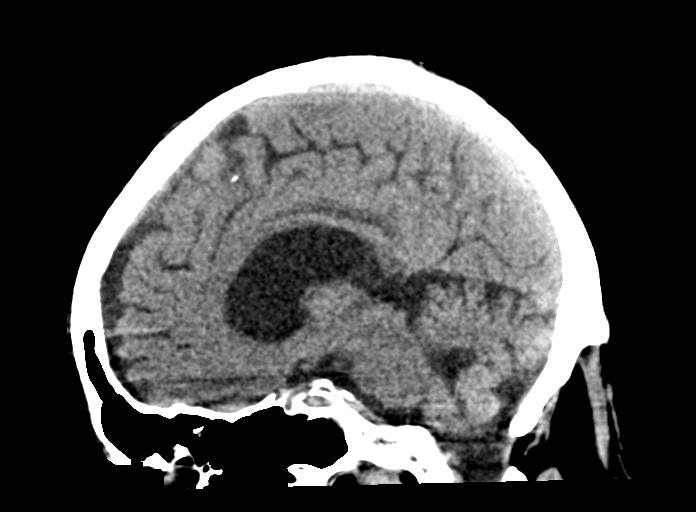
[im 45/67  brain]
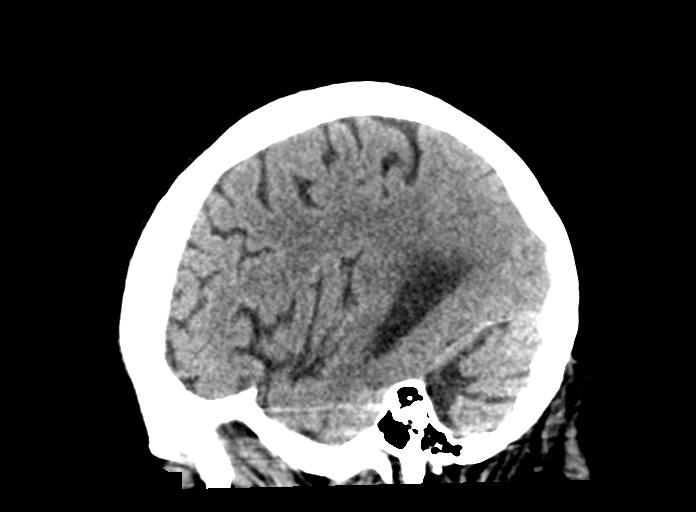

[15 of 47 positions shown; findings below may reference images not displayed]

FINDINGS: Brain: No acute territorial infarction, acute intracranial
hemorrhage or mass is visualized. Resolving right convexity subdural
hematoma, now 3 mm maximum thickness and more hypodense, consistent
with developing chronic subdural. Atrophy. Mild small vessel
ischemic changes of the white matter. No midline shift.

Vascular: No hyperdense vessels.  No unexpected calcification

Skull: No fracture

Sinuses/Orbits: No acute finding.

Other: None
IMPRESSION: 1. Resolving right convexity subdural hematoma, now 3 mm maximum
thickness and more hypodense, consistent with developing chronic
subdural. No acute intracranial hemorrhage is visualized.
2. Atrophy and mild small vessel ischemic changes of the white
matter.

## 2019-09-09 IMAGING — DX PORTABLE CHEST - 1 VIEW
1 series · 1 of 1 positions shown · non-contrast
Comparison: Chest radiograph September 05, 2018 and chest CT September 10, 2018

CLINICAL DATA: Hypertension and atrial fibrillation. Ethanol
intoxication.

EXAM:
PORTABLE CHEST 1 VIEW

[chest ap]
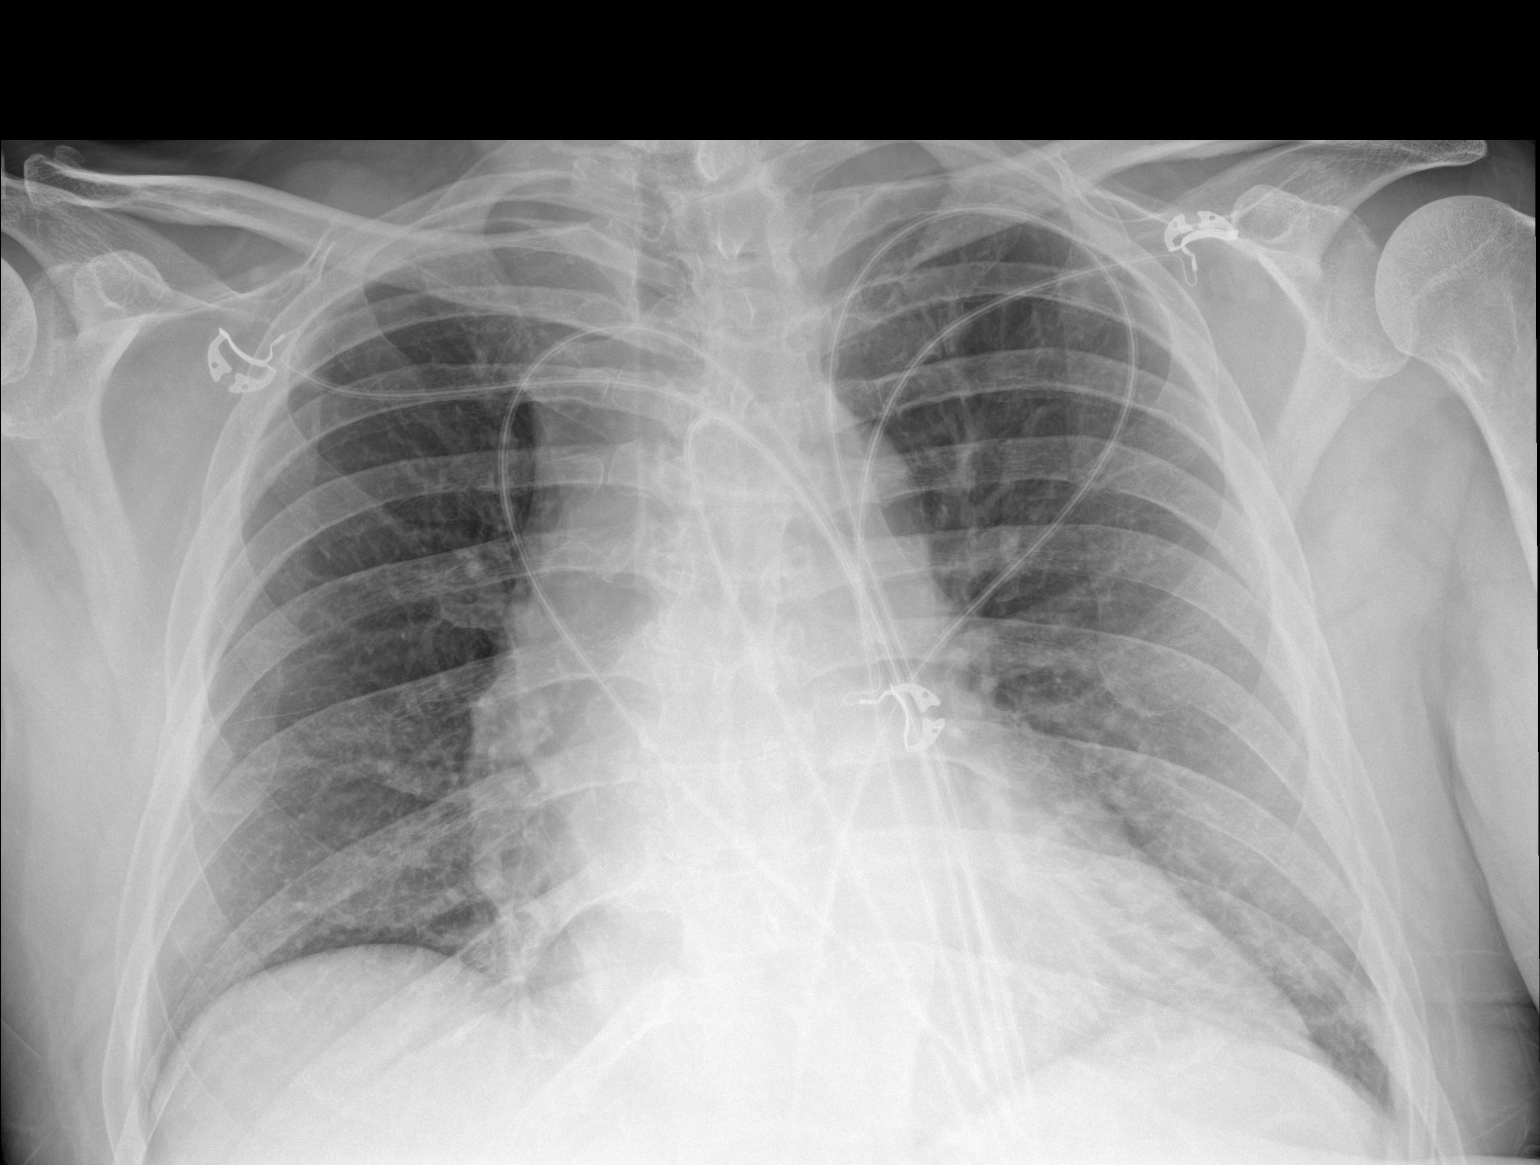

[1 of 1 positions shown; findings below may reference images not displayed]

FINDINGS: No edema or consolidation. Heart is slightly enlarged with pulmonary
vascularity normal. No adenopathy. No bone lesions.
IMPRESSION: Mild cardiac enlargement.  No edema or consolidation.
